# Patient Record
Sex: Female | Born: 1960 | Smoking: Never smoker
Health system: Southern US, Community
[De-identification: ages and names within clinical notes are randomized; demographics above are authoritative.]

## PROBLEM LIST (undated history)

## (undated) DIAGNOSIS — G4733 Obstructive sleep apnea (adult) (pediatric): Secondary | ICD-10-CM

## (undated) DIAGNOSIS — I10 Essential (primary) hypertension: Secondary | ICD-10-CM

## (undated) DIAGNOSIS — E039 Hypothyroidism, unspecified: Secondary | ICD-10-CM

## (undated) DIAGNOSIS — D049 Carcinoma in situ of skin, unspecified: Secondary | ICD-10-CM

## (undated) DIAGNOSIS — N200 Calculus of kidney: Secondary | ICD-10-CM

## (undated) HISTORY — DX: Calculus of kidney: N20.0

## (undated) HISTORY — PX: CHOLECYSTECTOMY: SHX55

## (undated) HISTORY — DX: Essential (primary) hypertension: I10

## (undated) HISTORY — PX: SCLEROTHERAPY: SHX6841

## (undated) HISTORY — DX: Hypothyroidism, unspecified: E03.9

## (undated) HISTORY — PX: ELBOW FRACTURE SURGERY: SHX616

## (undated) HISTORY — DX: Carcinoma in situ of skin, unspecified: D04.9

## (undated) HISTORY — DX: Obstructive sleep apnea (adult) (pediatric): G47.33

---

## 1993-04-08 HISTORY — PX: LITHOTRIPSY: SUR834

## 2018-11-27 ENCOUNTER — Ambulatory Visit (INDEPENDENT_AMBULATORY_CARE_PROVIDER_SITE_OTHER): Payer: Self-pay | Admitting: Family Medicine

## 2018-11-27 ENCOUNTER — Encounter: Payer: Self-pay | Admitting: Family Medicine

## 2018-11-27 ENCOUNTER — Other Ambulatory Visit: Payer: Self-pay

## 2018-11-27 DIAGNOSIS — M25562 Pain in left knee: Secondary | ICD-10-CM

## 2018-11-27 DIAGNOSIS — M25561 Pain in right knee: Secondary | ICD-10-CM

## 2018-11-27 DIAGNOSIS — Z13220 Encounter for screening for lipoid disorders: Secondary | ICD-10-CM

## 2018-11-27 DIAGNOSIS — Z85828 Personal history of other malignant neoplasm of skin: Secondary | ICD-10-CM

## 2018-11-27 DIAGNOSIS — T07XXXA Unspecified multiple injuries, initial encounter: Secondary | ICD-10-CM

## 2018-11-27 DIAGNOSIS — G8929 Other chronic pain: Secondary | ICD-10-CM

## 2018-11-27 DIAGNOSIS — D649 Anemia, unspecified: Secondary | ICD-10-CM

## 2018-11-27 DIAGNOSIS — I10 Essential (primary) hypertension: Secondary | ICD-10-CM

## 2018-11-27 DIAGNOSIS — G4733 Obstructive sleep apnea (adult) (pediatric): Secondary | ICD-10-CM | POA: Insufficient documentation

## 2018-11-27 DIAGNOSIS — E039 Hypothyroidism, unspecified: Secondary | ICD-10-CM

## 2018-11-27 DIAGNOSIS — G4730 Sleep apnea, unspecified: Secondary | ICD-10-CM

## 2018-11-27 MED ORDER — CALCIUM 600 + D 600-200 MG-UNIT PO TABS: 1.0000 | ORAL_TABLET | Freq: Every day | ORAL | 0 refills | Status: AC

## 2018-11-27 MED ORDER — FERROUS SULFATE IRON 200 (65 FE) MG PO TABS: 1.0000 | ORAL_TABLET | Freq: Every day | ORAL | Status: AC

## 2018-11-27 MED ORDER — LOSARTAN POTASSIUM 100 MG PO TABS: 100.0000 mg | ORAL_TABLET | Freq: Every day | ORAL | 1 refills | Status: DC

## 2018-11-27 MED ORDER — LEVOTHYROXINE SODIUM 175 MCG PO TABS: 175.0000 ug | ORAL_TABLET | Freq: Every day | ORAL | 1 refills | Status: DC

## 2018-11-27 MED ORDER — HYDROCHLOROTHIAZIDE 50 MG PO TABS: 50.0000 mg | ORAL_TABLET | Freq: Every day | ORAL | 1 refills | Status: DC

## 2018-11-27 MED ORDER — NAPROXEN SODIUM 220 MG PO TABS: 220.0000 mg | ORAL_TABLET | Freq: Two times a day (BID) | ORAL | Status: AC | PRN

## 2018-11-27 MED ORDER — ONE-A-DAY MENOPAUSE FORMULA PO TABS: 1.0000 | ORAL_TABLET | Freq: Every day | ORAL | Status: AC

## 2018-11-27 NOTE — Progress Notes (Signed)
Virtual Visit via Video Note  I connected with [name]   on 11/27/18 at  1:30 PM EDT by a video enabled telemedicine application and verified that I am speaking with the correct person using two identifiers.  Location patient: home Location provider:work office Persons participating in the virtual visit: patient, provider  I discussed the limitations of evaluation and management by telemedicine and the availability of in person appointments. The patient expressed understanding and agreed to proceed.   [name] DOB: [birth year] Encounter date: 11/27/2018  This is a 58 y.o. female who presents to establish care. Chief Complaint  Patient presents with  . Establish Care    History of present illness: No specific concerns today that she wants to discuss.   Needs some labwork done for her prescriptions.   HTN: doesn't check at home. Taking her medications as prescribed.   In wound care: was in [place] - was going there for doc appointments. Completed care in June. Circulation issues in lower extremities. Wears compression stockings and does pump to help with blood flow. Has not seen specialist for this outside of wound care except for cardiology. Had multiple wounds on left but has also had these on right as well.   Did have sclerotherapy for veins in legs which did help with wound healing.   Had enlarged spleen on imaging in past and had oncology work up for this but it was negative/normal.   Last mammogram/pap in November last year. States all was normal.   Sleep apnea: uses machine nightly.   Skin cancer (basal cell) superficial on back - hasn't seen derm regularly.   Last colonoscopy about 5 years ago was normal.    Past Medical History:  Diagnosis Date  . 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 Hypertension   . Hypothyroid   . Nephrolithiasis   . OSA (obstructive sleep apnea)    Past Surgical History:  Procedure Laterality Date  . CHOLECYSTECTOMY     . ELBOW FRACTURE SURGERY    . LITHOTRIPSY Left 1995  . SCLEROTHERAPY     Allergies  Allergen Reactions  . Ivp Dye [Iodinated Diagnostic Agents]     Hives, hot  . Sulfa Antibiotics     Hives, hot flashes   No outpatient medications have been marked as taking for the 11/27/18 encounter (Office Visit) with Caren Macadam, MD.   Social History   Tobacco Use  . Smoking status: Never Smoker  . Smokeless tobacco: Never Used  Substance Use Topics  . Alcohol use: Never    Frequency: Never   Family History  Problem Relation Age of Onset  . Hypertension Mother   . Lymphoma Mother   . Squamous cell carcinoma Father   . COPD Father   . Asthma Father   . Pancreatic cancer Father   . Diabetes Mellitus I Sister   . Heart disease Maternal Grandmother   . Heart disease Maternal Grandfather   . Thyroid disease Paternal Grandmother   . Alzheimer's disease Paternal Grandmother   . Heart disease Paternal Grandfather      Review of Systems  Constitutional: Negative for chills, fatigue and fever.  Respiratory: Negative for cough, chest tightness, shortness of breath and wheezing.   Cardiovascular: Negative for chest pain, palpitations and leg swelling.       Had stress testing in [place] due to chest pain which was normal (per patient) and has resolved.    Objective:  There were no vitals taken  for this visit.      BP Readings from Last 3 Encounters:  No data found for BP   Wt Readings from Last 3 Encounters:  No data found for Wt    EXAM:  GENERAL: alert, oriented, appears well and in no acute distress  HEENT: atraumatic, conjunctiva clear, no obvious abnormalities on inspection of external nose and ears  NECK: normal movements of the head and neck  LUNGS: on inspection no signs of respiratory distress, breathing rate appears normal, no obvious gross SOB, gasping or wheezing  CV: no obvious cyanosis  MS: moves all visible extremities without noticeable  abnormality  PSYCH/NEURO: pleasant and cooperative, no obvious depression or anxiety, speech and thought processing grossly intact  SKIN: no facial or neck abnormalities appreciated.  Assessment/Plan  1. Hypertension, unspecified type Medications refilled today.  Lab work ordered for patient to complete and we will call her back to set this up.  I did encourage her to start checking pressures at home so we have a baseline. - hydrochlorothiazide (HYDRODIURIL) 50 MG tablet; Take 1 tablet (50 mg total) by mouth daily.  Dispense: 90 tablet; Refill: 1 - losartan (COZAAR) 100 MG tablet; Take 1 tablet (100 mg total) by mouth daily.  Dispense: 90 tablet; Refill: 1 - CBC with Differential/Platelet; Future - Comprehensive metabolic panel; Future  2. Hypothyroidism, unspecified type We will recheck TSH.  I did refill previous dose of Synthroid for her. - levothyroxine (SYNTHROID) 175 MCG tablet; Take 1 tablet (175 mcg total) by mouth daily before breakfast.  Dispense: 90 tablet; Refill: 1 - TSH; Future  3. Sleep apnea, unspecified type She has done well with CPAP.  I am putting in referral for long-term management for her. - Ambulatory referral to Sleep Studies  4. Chronic pain of both knees Can further evaluate when in office at next visit.  Would consider Ortho for follow-up. - naproxen sodium (ALEVE) 220 MG tablet; Take 1 tablet (220 mg total) by mouth 2 (two) times daily as needed.  5. Lipid screening - Lipid panel; Future  6. Multiple fractures Consider bone density once COVID situation is improved. - Calcium Carb-Cholecalciferol (CALCIUM 600 + D) 600-200 MG-UNIT TABS; Take 1 tablet by mouth daily.  Dispense: 30 tablet; Refill: 0 - VITAMIN D 25 Hydroxy (Vit-D Deficiency, Fractures); Future  7. Anemia, unspecified type - Ferrous Sulfate Dried (FERROUS SULFATE IRON) 200 (65 Fe) MG TABS; Take 1 tablet by mouth daily.  Dispense: 30 tablet - Vitamin B12; Future - IBC + Ferritin;  Future  8. History of skin cancer - Ambulatory referral to Dermatology  Return for bloodwork. Schedule physical after this.   I discussed the assessment and treatment plan with the patient. The patient was provided an opportunity to ask questions and all were answered. The patient agreed with the plan and demonstrated an understanding of the instructions.   The patient was advised to call back or seek an in-person evaluation if the symptoms worsen or if the condition fails to improve as anticipated.  I provided 35 minutes of non-face-to-face time during this encounter.   Theodis ShoveJunell Koberlein, MD

## 2018-11-30 ENCOUNTER — Telehealth: Payer: Self-pay | Admitting: *Deleted

## 2018-11-30 NOTE — Telephone Encounter (Signed)
Left a detailed message at the pts cell number to call for appts as below.   

## 2018-11-30 NOTE — Telephone Encounter (Signed)
-----   Message from Caren Macadam, MD sent at 11/27/2018  2:36 PM EDT ----- Please schedule lab visit for her; physical can be schedule in office as well if she would like- ok to push out a few months.

## 2018-12-07 ENCOUNTER — Other Ambulatory Visit: Payer: Self-pay

## 2018-12-07 ENCOUNTER — Ambulatory Visit: Payer: BC Managed Care – PPO

## 2018-12-07 DIAGNOSIS — D649 Anemia, unspecified: Secondary | ICD-10-CM

## 2018-12-07 DIAGNOSIS — Z13220 Encounter for screening for lipoid disorders: Secondary | ICD-10-CM

## 2018-12-07 DIAGNOSIS — E039 Hypothyroidism, unspecified: Secondary | ICD-10-CM

## 2018-12-07 DIAGNOSIS — T07XXXA Unspecified multiple injuries, initial encounter: Secondary | ICD-10-CM

## 2018-12-07 DIAGNOSIS — I10 Essential (primary) hypertension: Secondary | ICD-10-CM

## 2018-12-07 LAB — COMPREHENSIVE METABOLIC PANEL
ALT: 17 U/L (ref 0–35)
AST: 21 U/L (ref 0–37)
Albumin: 4.2 g/dL (ref 3.5–5.2)
Alkaline Phosphatase: 90 U/L (ref 39–117)
BUN: 20 mg/dL (ref 6–23)
CO2: 29 mEq/L (ref 19–32)
Calcium: 9.4 mg/dL (ref 8.4–10.5)
Chloride: 103 mEq/L (ref 96–112)
Creatinine, Ser: 0.93 mg/dL (ref 0.40–1.20)
GFR: 61.51 mL/min (ref >60.00)
Glucose, Bld: 92 mg/dL (ref 70–99)
Potassium: 4.5 mEq/L (ref 3.5–5.1)
Sodium: 141 mEq/L (ref 135–145)
Total Bilirubin: 0.3 mg/dL (ref 0.2–1.2)
Total Protein: 7.3 g/dL (ref 6.0–8.3)

## 2018-12-07 LAB — CBC WITH DIFFERENTIAL/PLATELET
Basophils Absolute: 0 10*3/uL (ref 0.0–0.1)
Basophils Relative: 0.3 % (ref 0.0–3.0)
Eosinophils Absolute: 0.2 10*3/uL (ref 0.0–0.7)
Eosinophils Relative: 3.6 % (ref 0.0–5.0)
HCT: 33.8 % — ABNORMAL LOW (ref 36.0–46.0)
Hemoglobin: 11.4 g/dL — ABNORMAL LOW (ref 12.0–15.0)
Lymphocytes Relative: 24.4 % (ref 12.0–46.0)
Lymphs Abs: 1.5 10*3/uL (ref 0.7–4.0)
MCHC: 33.7 g/dL (ref 30.0–36.0)
MCV: 97.2 fl (ref 78.0–100.0)
Monocytes Absolute: 0.4 10*3/uL (ref 0.1–1.0)
Monocytes Relative: 6.3 % (ref 3.0–12.0)
Neutro Abs: 3.9 10*3/uL (ref 1.4–7.7)
Neutrophils Relative %: 65.4 % (ref 43.0–77.0)
Platelets: 185 10*3/uL (ref 150.0–400.0)
RBC: 3.48 Mil/uL — ABNORMAL LOW (ref 3.87–5.11)
RDW: 13.4 % (ref 11.5–15.5)
WBC: 6 10*3/uL (ref 4.0–10.5)

## 2018-12-07 LAB — LIPID PANEL
Cholesterol: 166 mg/dL (ref 0–200)
HDL: 45.7 mg/dL (ref >39.00)
LDL Cholesterol: 100 mg/dL — ABNORMAL HIGH (ref 0–99)
NonHDL: 120.02
Total CHOL/HDL Ratio: 4
Triglycerides: 98 mg/dL (ref 0.0–149.0)
VLDL: 19.6 mg/dL (ref 0.0–40.0)

## 2018-12-07 LAB — VITAMIN B12: Vitamin B-12: 249 pg/mL (ref 211–911)

## 2018-12-07 LAB — IBC + FERRITIN
Ferritin: 189 ng/mL (ref 10.0–291.0)
Iron: 64 ug/dL (ref 42–145)
Saturation Ratios: 18.1 % — ABNORMAL LOW (ref 20.0–50.0)
Transferrin: 252 mg/dL (ref 212.0–360.0)

## 2018-12-07 LAB — TSH: TSH: 3.69 u[IU]/mL (ref 0.35–4.50)

## 2018-12-07 LAB — VITAMIN D 25 HYDROXY (VIT D DEFICIENCY, FRACTURES): VITD: 33.46 ng/mL (ref 30.00–100.00)

## 2019-01-12 ENCOUNTER — Encounter: Payer: Self-pay | Admitting: Pulmonary Disease

## 2019-01-12 ENCOUNTER — Ambulatory Visit: Payer: BC Managed Care – PPO | Admitting: Pulmonary Disease

## 2019-01-12 ENCOUNTER — Other Ambulatory Visit: Payer: Self-pay

## 2019-01-12 DIAGNOSIS — Z9989 Dependence on other enabling machines and devices: Secondary | ICD-10-CM | POA: Diagnosis not present

## 2019-01-12 DIAGNOSIS — G4733 Obstructive sleep apnea (adult) (pediatric): Secondary | ICD-10-CM

## 2019-01-12 DIAGNOSIS — R0600 Dyspnea, unspecified: Secondary | ICD-10-CM

## 2019-01-12 DIAGNOSIS — R0609 Other forms of dyspnea: Secondary | ICD-10-CM

## 2019-01-12 NOTE — Progress Notes (Signed)
Subjective:    Patient ID: [name], female    DOB: 1960/06/25, 58 y.o.   MRN: [id number]  HPI  Chief Complaint  Patient presents with  . Consult    Obstructive Sleep Apnea on CPAP    58 year old presents to establish for obstructive sleep apnea. She was diagnosed about 5 years ago with a sleep study done in [place] which showed AHI of 80/hour.  She was started on full face mask with auto CPAP and has done very well since then with improvement in her daytime somnolence and fatigue. She brings in her CPAP chip from which we were able to obtain a download, she chose auto settings of 7 to 12 cm with excellent compliance 6.5 to 7 hours per night, no residual events and average pressure of 10 cm with minimal leak. She has adjusted to the full facemask and denies any problems with it. She has moved from [place] to [place] and is working as a Optometrist and taking care of her grandchildren.  Epworth sleepiness score is 7 and she denies excessive daytime somnolence and fatigue. Bedtime is between 10 and 11 PM, she sleeps on her back but also on her side, with 2 pillows, sleep latency is 10 to 15 minutes, denies nocturnal awakenings has occasional nocturia and is out of bed by 5:30 AM feeling refreshed with occasional dryness of mouth but denies headaches. She has gained 20 pounds in the last 6 months There is no history suggestive of cataplexy, sleep paralysis or parasomnias  She does report dyspnea on exertion and wonders if this is related to deconditioning   Past Medical History:  Diagnosis Date  . 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 Hypertension   . Hypothyroid   . Nephrolithiasis   . OSA (obstructive sleep apnea)     Past Surgical History:  Procedure Laterality Date  . CHOLECYSTECTOMY    . ELBOW FRACTURE SURGERY    . LITHOTRIPSY Left 1995  . SCLEROTHERAPY      Allergies  Allergen Reactions  . Ivp Dye [Iodinated Diagnostic Agents]    Hives, hot  . Sulfa Antibiotics     Hives, hot flashes    Social History   Socioeconomic History  . Marital status: Unknown    Spouse name: Not on file  . Number of children: Not on file  . Years of education: Not on file  . Highest education level: Not on file  Occupational History  . Not on file  Social Needs  . Financial resource strain: Not on file  . Food insecurity    Worry: Not on file    Inability: Not on file  . Transportation needs    Medical: Not on file    Non-medical: Not on file  Tobacco Use  . Smoking status: Never Smoker  . Smokeless tobacco: Never Used  Substance and Sexual Activity  . Alcohol use: Never    Frequency: Never  . Drug use: Never  . Sexual activity: Not Currently  Lifestyle  . Physical activity    Days per week: Not on file    Minutes per session: Not on file  . Stress: Not on file  Relationships  . Social Herbalist on phone: Not on file    Gets together: Not on file    Attends religious service: Not on file    Active member of club or organization: Not on file    Attends meetings of clubs or  organizations: Not on file    Relationship status: Not on file  . Intimate partner violence    Fear of current or ex partner: Not on file    Emotionally abused: Not on file    Physically abused: Not on file    Forced sexual activity: Not on file  Other Topics Concern  . Not on file  Social History Narrative  . Not on file     Family History  Problem Relation Age of Onset  . Hypertension Mother   . Lymphoma Mother   . Squamous cell carcinoma Father   . COPD Father   . Asthma Father   . Pancreatic cancer Father   . Diabetes Mellitus I Sister   . Heart disease Maternal Grandmother   . Heart disease Maternal Grandfather   . Thyroid disease Paternal Grandmother   . Alzheimer's disease Paternal Grandmother   . Heart disease Paternal Grandfather      Review of Systems  Constitutional: negative for anorexia, fevers and  sweats  Eyes: negative for irritation, redness and visual disturbance  Ears, nose, mouth, throat, and face: negative for earaches, epistaxis, nasal congestion and sore throat  Respiratory: negative for cough, sputum and wheezing positive for dyspnea on activity Cardiovascular: negative for chest pain,  orthopnea, palpitations and syncope positive for lower extremity edema and open wounds Gastrointestinal: negative for abdominal pain, constipation, diarrhea, melena, nausea and vomiting  Genitourinary:negative for dysuria, frequency and hematuria  Hematologic/lymphatic: negative for bleeding, easy bruising and lymphadenopathy  Musculoskeletal:negative for arthralgias, muscle weakness positive for stiff joints  Neurological: negative for coordination problems, gait problems, headaches and weakness  Endocrine: negative for diabetic symptoms including polydipsia, polyuria and weight loss     Objective:   Physical Exam        Assessment & Plan:

## 2019-01-12 NOTE — Assessment & Plan Note (Signed)
Likely related to weight gain and deconditioning. Recommend to start on walking for 30 minutes at her own pace 4 times a week

## 2019-01-12 NOTE — Patient Instructions (Signed)
Give [place] a call when he needs supplies and we will set you up with a local DME

## 2019-01-12 NOTE — Assessment & Plan Note (Signed)
Events appear to be well-controlled on auto CPAP, average pressure is 10 cm.  She is very compliant and CPAP is certainly helped improve her daytime somnolence and fatigue.  We will try to obtain her sleep studies from [facility] pulmonary.  She will call us when she needs supplies and we will set her up with a local DME  Weight loss encouraged, compliance with goal of at least 4-6 hrs every night is the expectation. Advised against medications with sedative side effects Cautioned against driving when sleepy - understanding that sleepiness will vary on a day to day basis

## 2019-02-03 ENCOUNTER — Encounter: Payer: Self-pay | Admitting: Family Medicine

## 2019-02-03 ENCOUNTER — Other Ambulatory Visit: Payer: Self-pay

## 2019-02-03 ENCOUNTER — Ambulatory Visit (INDEPENDENT_AMBULATORY_CARE_PROVIDER_SITE_OTHER): Payer: BC Managed Care – PPO | Admitting: Family Medicine

## 2019-02-03 VITALS — BP 132/82 | HR 76 | Temp 97.5°F | Ht 61.0 in | Wt 305.3 lb

## 2019-02-03 DIAGNOSIS — Z0000 Encounter for general adult medical examination without abnormal findings: Secondary | ICD-10-CM

## 2019-02-03 DIAGNOSIS — Z1231 Encounter for screening mammogram for malignant neoplasm of breast: Secondary | ICD-10-CM | POA: Diagnosis not present

## 2019-02-03 DIAGNOSIS — I10 Essential (primary) hypertension: Secondary | ICD-10-CM

## 2019-02-03 DIAGNOSIS — G4733 Obstructive sleep apnea (adult) (pediatric): Secondary | ICD-10-CM | POA: Diagnosis not present

## 2019-02-03 DIAGNOSIS — Z9989 Dependence on other enabling machines and devices: Secondary | ICD-10-CM

## 2019-02-03 DIAGNOSIS — Z23 Encounter for immunization: Secondary | ICD-10-CM | POA: Diagnosis not present

## 2019-02-03 DIAGNOSIS — E039 Hypothyroidism, unspecified: Secondary | ICD-10-CM

## 2019-02-03 NOTE — Patient Instructions (Signed)
Consider adding B12 supplement 1054mcg sublingual tablet daily

## 2019-02-03 NOTE — Progress Notes (Signed)
[name] DOB: 10/29/1960 Encounter date: 02/03/2019  This is a 58 y.o. female who presents for complete physical   History of present illness/Additional concerns: HTN: hctz, losartan  Hypothyroid: synthroid 175mcg  Sleep apnea: referred to specialist, wears machine nightly. Saw specialist already.   bilat chronic knee pain - just depends on day and activity level. Really wants to lose weight. Has been harder with her being home. Really upset her today.   Anemia: stable on bloodwork from 12/07/18.  Hx of skin ca: referred to dermatology; has appointment coming up in November/December.   Doesn't do well with diets. Limited exercise due to knee pain, easily winded. She also had issues wounds in legs. Most luck that she had was on weight watchers. Tried to go back to this a few years ago, but she states it was online and she didn't like this. Trying to stick with it is an issue with her. Doesn't feel like she over-eats, most type of foods - meat, potatoes, bread. More sedentary with COVID.   Stomach feels enlarged. Worries that this is something more than weight gain.    mammogram: had this done last November. Gets yearly. -pap was done last year; normal per patient.  -colonoscopy done in last 5 years. Patient states this was normal and repeat was due in 10 years.   Has broken both arms falling in last 5 years.   Past Medical History:  Diagnosis Date  . 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 Hypertension   . Hypothyroid   . Nephrolithiasis   . OSA (obstructive sleep apnea)    Past Surgical History:  Procedure Laterality Date  . CHOLECYSTECTOMY    . ELBOW FRACTURE SURGERY    . LITHOTRIPSY Left 1995  . SCLEROTHERAPY     Allergies  Allergen Reactions  . Ivp Dye [Iodinated Diagnostic Agents]     Hives, hot  . Sulfa Antibiotics     Hives, hot flashes   Current Meds  Medication Sig  . acetaminophen (TYLENOL) 500 MG tablet Take 500 mg by mouth as needed.  .  Calcium Carb-Cholecalciferol (CALCIUM 600 + D) 600-200 MG-UNIT TABS Take 1 tablet by mouth daily.  . Ferrous Sulfate Dried (FERROUS SULFATE IRON) 200 (65 Fe) MG TABS Take 1 tablet by mouth daily.  . hydrochlorothiazide (HYDRODIURIL) 50 MG tablet Take 1 tablet (50 mg total) by mouth daily.  Marland Kitchen. levothyroxine (SYNTHROID) 175 MCG tablet Take 1 tablet (175 mcg total) by mouth daily before breakfast.  . losartan (COZAAR) 100 MG tablet Take 1 tablet (100 mg total) by mouth daily.  . Multiple Vitamins-Minerals (ONE-A-DAY MENOPAUSE FORMULA) TABS Take 1 tablet by mouth daily.  . naproxen sodium (ALEVE) 220 MG tablet Take 1 tablet (220 mg total) by mouth 2 (two) times daily as needed.  Marland Kitchen. OVER THE COUNTER MEDICATION OTC antacid as needed (cannot recall name)   Social History   Tobacco Use  . Smoking status: Never Smoker  . Smokeless tobacco: Never Used  Substance Use Topics  . Alcohol use: Never    Frequency: Never   Family History  Problem Relation Age of Onset  . Hypertension Mother   . Lymphoma Mother   . Squamous cell carcinoma Father   . COPD Father   . Asthma Father   . Pancreatic cancer Father   . Diabetes Mellitus I Sister   . Heart disease Maternal Grandmother   . Heart disease Maternal Grandfather   . Thyroid disease Paternal Grandmother   .  Alzheimer's disease Paternal Grandmother   . Heart disease Paternal Grandfather      Review of Systems  Constitutional: Negative for activity change, appetite change, chills, fatigue, fever and unexpected weight change.  HENT: Negative for congestion, ear pain, hearing loss, sinus pressure, sinus pain, sore throat and trouble swallowing.   Eyes: Negative for pain and visual disturbance.  Respiratory: Negative for cough, chest tightness, shortness of breath and wheezing.   Cardiovascular: Negative for chest pain, palpitations and leg swelling.  Gastrointestinal: Negative for abdominal pain, blood in stool, constipation, diarrhea, nausea and  vomiting.  Genitourinary: Negative for difficulty urinating and menstrual problem.  Musculoskeletal: Negative for arthralgias and back pain.  Skin: Negative for rash.  Neurological: Negative for dizziness, weakness, numbness and headaches.  Hematological: Negative for adenopathy. Does not bruise/bleed easily.  Psychiatric/Behavioral: Negative for sleep disturbance and suicidal ideas. The patient is not nervous/anxious.     CBC:  Lab Results  Component Value Date   WBC 6.0 12/07/2018   HGB 11.4 (L) 12/07/2018   HCT 33.8 (L) 12/07/2018   MCHC 33.7 12/07/2018   RDW 13.4 12/07/2018   PLT 185.0 12/07/2018   CMP: Lab Results  Component Value Date   NA 141 12/07/2018   K 4.5 12/07/2018   CL 103 12/07/2018   CO2 29 12/07/2018   GLUCOSE 92 12/07/2018   BUN 20 12/07/2018   CREATININE 0.93 12/07/2018   CALCIUM 9.4 12/07/2018   PROT 7.3 12/07/2018   BILITOT 0.3 12/07/2018   ALKPHOS 90 12/07/2018   ALT 17 12/07/2018   AST 21 12/07/2018   LIPID: Lab Results  Component Value Date   CHOL 166 12/07/2018   TRIG 98.0 12/07/2018   HDL 45.70 12/07/2018   LDLCALC 100 (H) 12/07/2018    Objective:  BP 132/82 (BP Location: Left Arm, Patient Position: Sitting, Cuff Size: Large)   Pulse 76   Temp (!) 97.5 F (36.4 C) (Temporal)   Ht 5\' 1"  (1.549 m)   Wt (!) 305 lb 4.8 oz (138.5 kg)   SpO2 96%   BMI 57.69 kg/m   Weight: (!) 305 lb 4.8 oz (138.5 kg)   BP Readings from Last 3 Encounters:  02/03/19 132/82  01/12/19 130/72   Wt Readings from Last 3 Encounters:  02/03/19 (!) 305 lb 4.8 oz (138.5 kg)  01/12/19 (!) 304 lb (137.9 kg)    Physical Exam Constitutional:      General: She is not in acute distress.    Appearance: She is well-developed.  HENT:     Head: Normocephalic and atraumatic.     Right Ear: External ear normal.     Left Ear: External ear normal.     Mouth/Throat:     Pharynx: No oropharyngeal exudate.  Eyes:     Conjunctiva/sclera: Conjunctivae normal.      Pupils: Pupils are equal, round, and reactive to light.  Neck:     Musculoskeletal: Normal range of motion and neck supple.     Thyroid: No thyromegaly.  Cardiovascular:     Rate and Rhythm: Normal rate and regular rhythm.     Heart sounds: Normal heart sounds. No murmur. No friction rub. No gallop.   Pulmonary:     Effort: Pulmonary effort is normal.     Breath sounds: Normal breath sounds.  Chest:     Comments: Breast exam deferred, but mammogram was ordered Abdominal:     General: Bowel sounds are normal. There is no distension.     Palpations: Abdomen  is soft. There is no mass.     Tenderness: There is no abdominal tenderness. There is no guarding.     Hernia: No hernia is present.  Musculoskeletal: Normal range of motion.        General: No tenderness or deformity.  Lymphadenopathy:     Cervical: No cervical adenopathy.  Skin:    General: Skin is warm and dry.     Findings: No rash.     Comments: Skin exam is deferred as she is following with dermatology  Neurological:     Mental Status: She is alert and oriented to person, place, and time.     Deep Tendon Reflexes: Reflexes normal.     Reflex Scores:      Tricep reflexes are 2+ on the right side and 2+ on the left side.      Bicep reflexes are 2+ on the right side and 2+ on the left side.      Brachioradialis reflexes are 2+ on the right side and 2+ on the left side.      Patellar reflexes are 2+ on the right side and 2+ on the left side. Psychiatric:        Speech: Speech normal.        Behavior: Behavior normal.        Thought Content: Thought content normal.     Assessment/Plan: Health Maintenance Due  Topic Date Due  . TETANUS/TDAP  01/06/1978  . COLONOSCOPY  01/06/2009   Health Maintenance reviewed. We will get previous records from [name] with prior colonoscopy, pap.  1. Preventative health care We discussed importance of exercise and weight loss.  She was upset about her weight today.  She has not been  over 300 pounds before.  We discussed working on lower carbohydrate diet.  Her husband is diabetic and should be eating a lower carb diet anyway.  We discussed importance of daily exercise, but this is limited secondary to her knee pain.  I went over a restricted calorie diet with her and we discussed finding her motivation to work on healthier eating.  We discussed feeding holidays not of the time for temptation, but is a time to get a gift to herself for better health.  2. OSA on CPAP Uses CPAP daily.  Has followed up with pulmonology.  3. Hypertension, unspecified type Controlled.  Continue current medication.  4. Hypothyroidism, unspecified type Controlled.  Continue current medication.  5. Encounter for screening mammogram for malignant neoplasm of breast - MM DIGITAL SCREENING BILATERAL; Future  6. Need for immunization against influenza - Flu Vaccine QUAD 6+ mos PF IM (Fluarix Quad PF)  7. Need for shingles vaccine - Varicella-zoster vaccine IM (Shingrix)  Return for i will call her for follow up after record review.  We discussed having a short term follow-up so that we can touch base with weight loss and make sure she is making progress.  Theodis Shove, MD

## 2019-04-13 ENCOUNTER — Other Ambulatory Visit: Payer: Self-pay

## 2019-04-13 ENCOUNTER — Ambulatory Visit
Admission: RE | Admit: 2019-04-13 | Discharge: 2019-04-13 | Disposition: A | Discharge status: Home / Self Care | Payer: BC Managed Care – PPO | Source: Ambulatory Visit | Attending: Family Medicine | Admitting: Family Medicine

## 2019-04-13 DIAGNOSIS — Z1231 Encounter for screening mammogram for malignant neoplasm of breast: Secondary | ICD-10-CM

## 2019-04-13 IMAGING — MG DIGITAL SCREENING BILAT W/ CAD
7 series · 7 of 7 positions shown · non-contrast
Comparison: None.

CLINICAL DATA: Screening.

EXAM:
DIGITAL SCREENING BILATERAL MAMMOGRAM WITH CAD

[L MLO (1 of 2)]
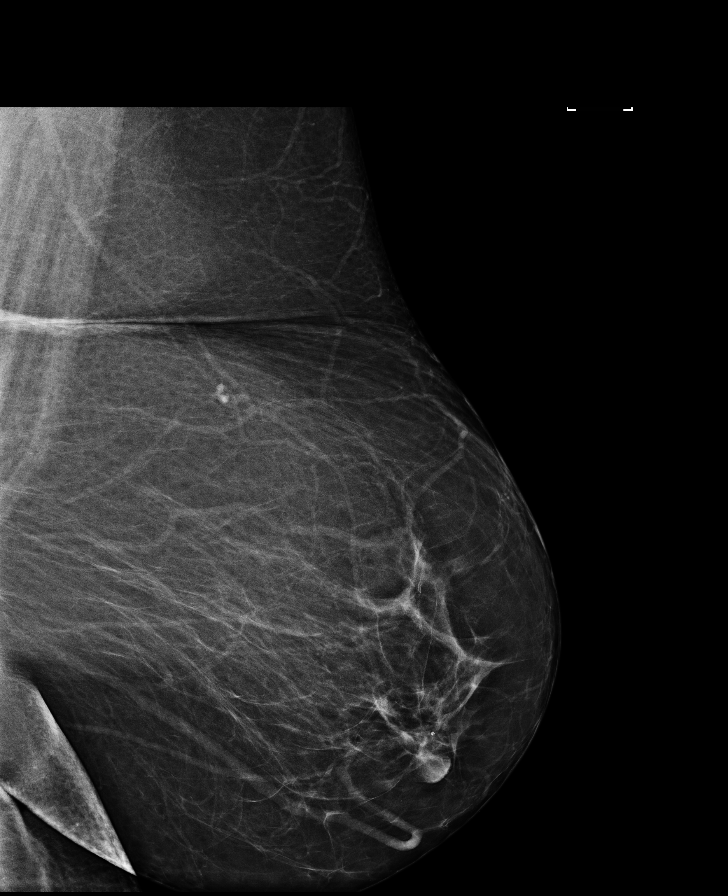

[R MLO (1 of 3)]
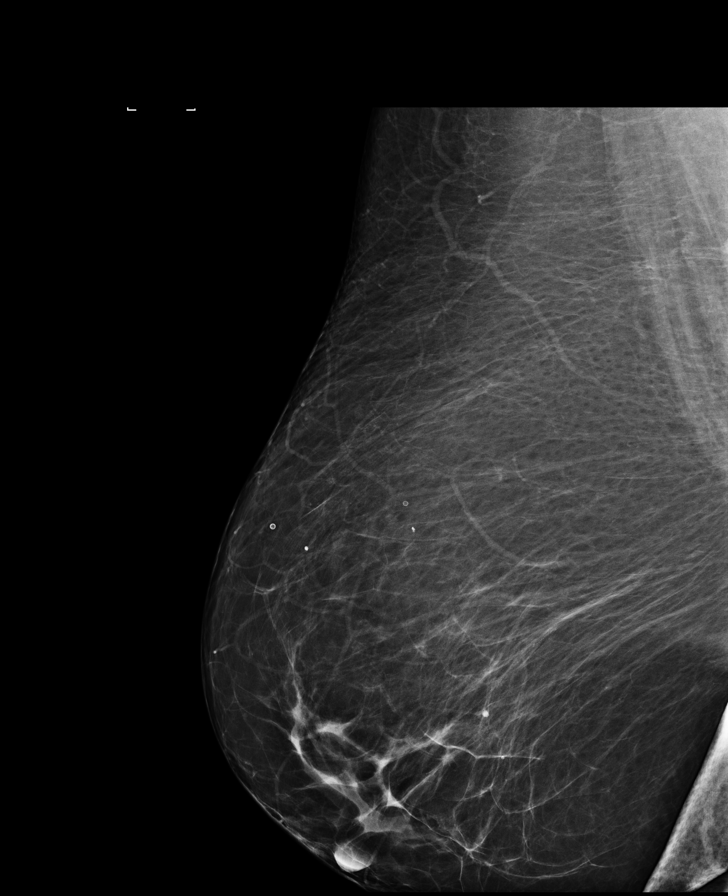

[R MLO (2 of 3)]
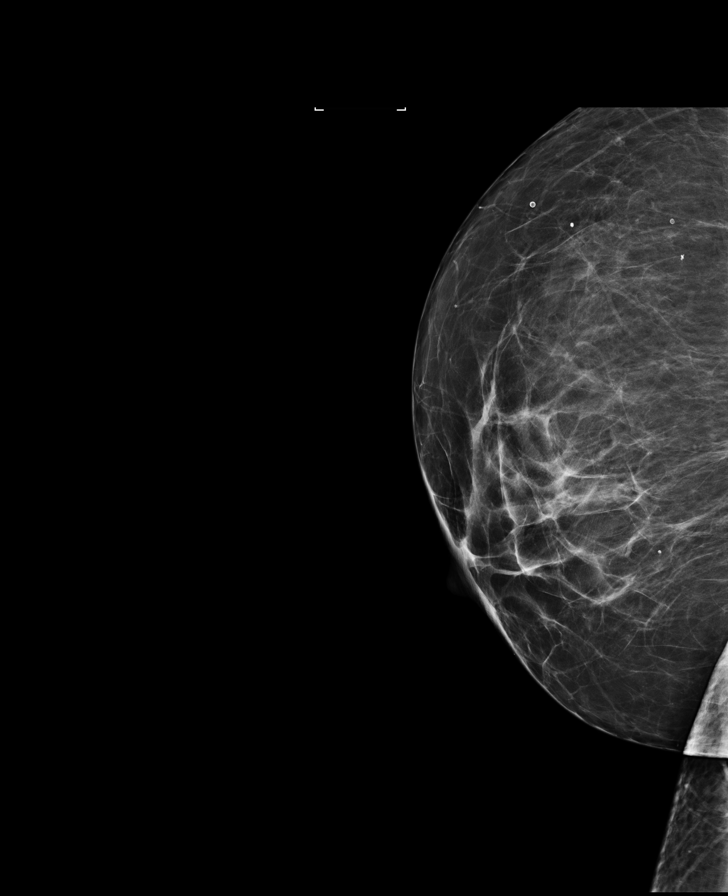

[R CC]
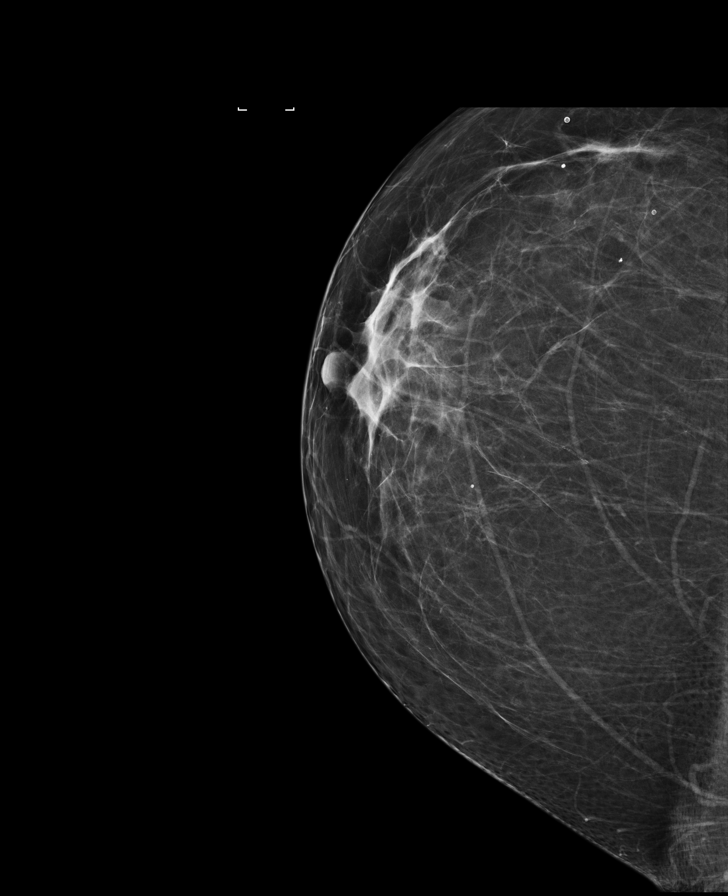

[L CC]
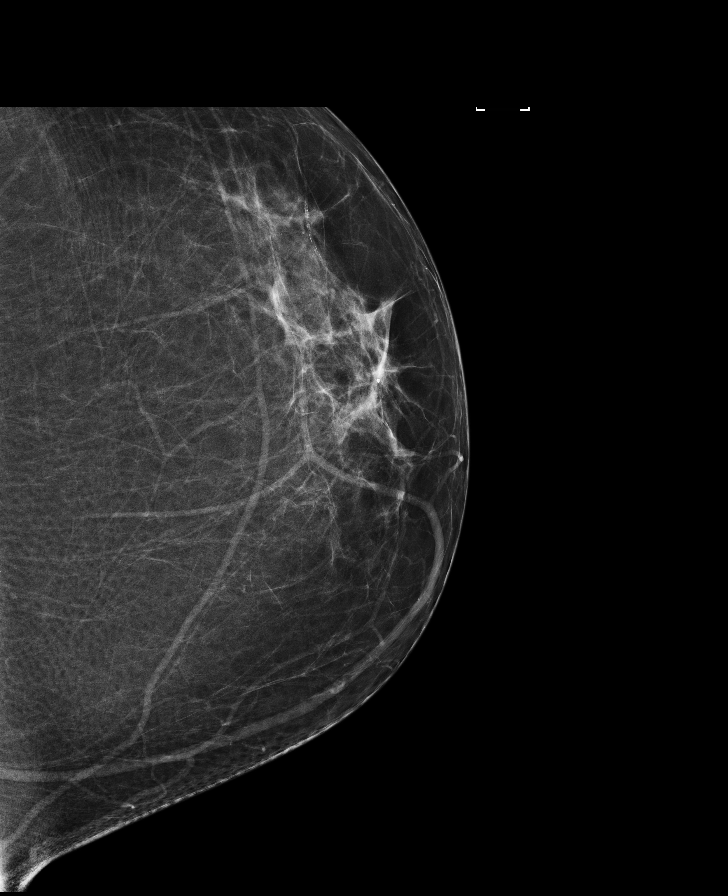

[R MLO (3 of 3)]
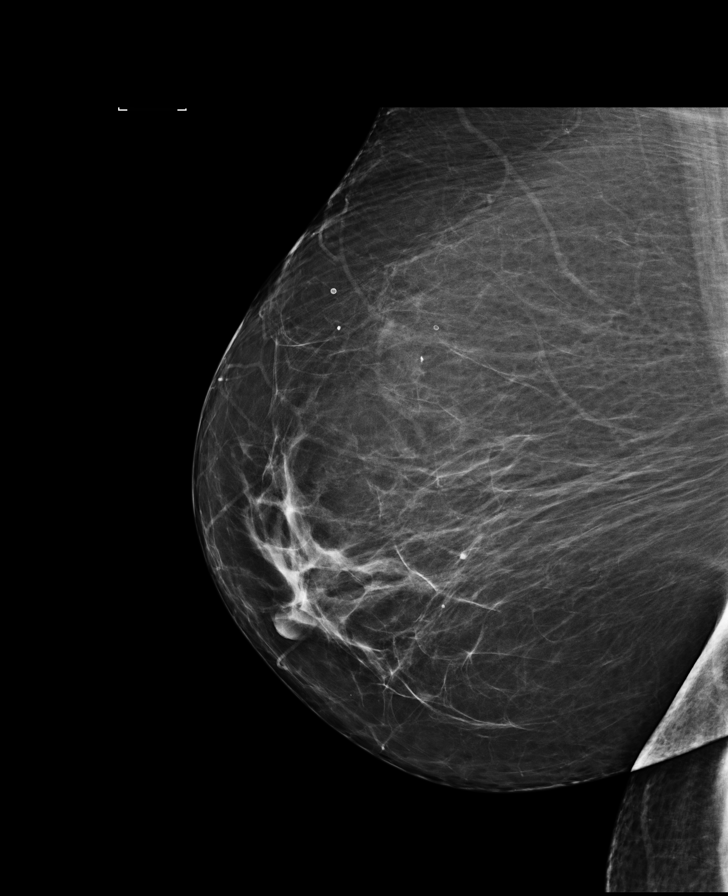

[L MLO (2 of 2)]
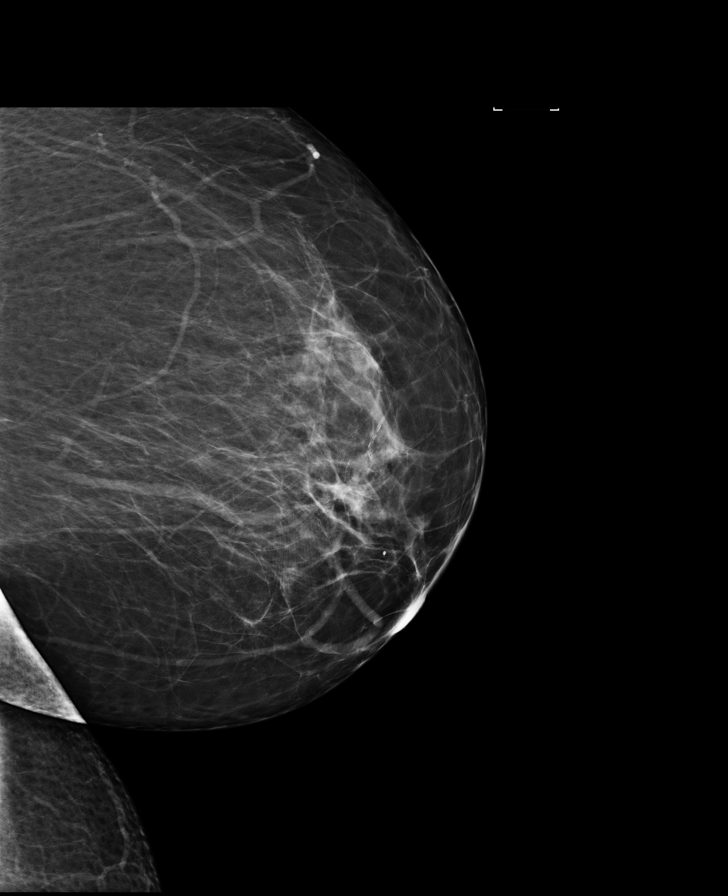

[7 of 7 positions shown; findings below may reference images not displayed]

ACR Breast Density Category b: There are scattered areas of
fibroglandular density.
FINDINGS: There are no findings suspicious for malignancy. Images were
processed with CAD.
IMPRESSION: No mammographic evidence of malignancy. A result letter of this
screening mammogram will be mailed directly to the patient.

RECOMMENDATION:
Screening mammogram in one year. (Code:[GD])

BI-RADS CATEGORY  1: Negative.

## 2019-06-03 ENCOUNTER — Other Ambulatory Visit: Payer: Self-pay | Admitting: Family Medicine

## 2019-06-03 DIAGNOSIS — I10 Essential (primary) hypertension: Secondary | ICD-10-CM

## 2019-07-12 ENCOUNTER — Telehealth: Payer: Self-pay | Admitting: Family Medicine

## 2019-07-12 ENCOUNTER — Other Ambulatory Visit: Payer: Self-pay | Admitting: Family Medicine

## 2019-07-12 DIAGNOSIS — E039 Hypothyroidism, unspecified: Secondary | ICD-10-CM

## 2019-07-12 MED ORDER — LEVOTHYROXINE SODIUM 175 MCG PO TABS: 175.0000 ug | ORAL_TABLET | Freq: Every day | ORAL | 1 refills | Status: DC

## 2019-07-12 NOTE — Telephone Encounter (Signed)
The pharmacy called to see if it is okay to change the patients medication to a generic manufacture for the patients Rx.  levothyroxine (SYNTHROID) 175 MCG tablet  [address] [place], [place] - [address] Phone:  [phone]  Fax:  [phone]

## 2019-07-12 NOTE — Telephone Encounter (Signed)
done

## 2019-08-18 ENCOUNTER — Other Ambulatory Visit: Payer: Self-pay | Admitting: Family Medicine

## 2019-08-18 DIAGNOSIS — I10 Essential (primary) hypertension: Secondary | ICD-10-CM

## 2019-08-29 ENCOUNTER — Other Ambulatory Visit: Payer: Self-pay | Admitting: Family Medicine

## 2019-08-29 DIAGNOSIS — I10 Essential (primary) hypertension: Secondary | ICD-10-CM

## 2019-08-30 NOTE — Telephone Encounter (Signed)
Pt is calling in stating that she is out of her losartan and will call back to get an appointment to see Dr. 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    Pharm:  [facility]

## 2019-09-22 ENCOUNTER — Other Ambulatory Visit: Payer: Self-pay

## 2019-09-22 ENCOUNTER — Ambulatory Visit (INDEPENDENT_AMBULATORY_CARE_PROVIDER_SITE_OTHER): Payer: BC Managed Care – PPO | Admitting: Family Medicine

## 2019-09-22 ENCOUNTER — Encounter: Payer: Self-pay | Admitting: Family Medicine

## 2019-09-22 VITALS — BP 120/80 | HR 72 | Temp 97.9°F | Ht 61.0 in | Wt 299.0 lb

## 2019-09-22 DIAGNOSIS — I10 Essential (primary) hypertension: Secondary | ICD-10-CM | POA: Diagnosis not present

## 2019-09-22 DIAGNOSIS — E039 Hypothyroidism, unspecified: Secondary | ICD-10-CM

## 2019-09-22 DIAGNOSIS — E538 Deficiency of other specified B group vitamins: Secondary | ICD-10-CM | POA: Diagnosis not present

## 2019-09-22 DIAGNOSIS — Z13220 Encounter for screening for lipoid disorders: Secondary | ICD-10-CM | POA: Diagnosis not present

## 2019-09-22 DIAGNOSIS — M25561 Pain in right knee: Secondary | ICD-10-CM

## 2019-09-22 DIAGNOSIS — E559 Vitamin D deficiency, unspecified: Secondary | ICD-10-CM | POA: Diagnosis not present

## 2019-09-22 DIAGNOSIS — G8929 Other chronic pain: Secondary | ICD-10-CM

## 2019-09-22 DIAGNOSIS — G4733 Obstructive sleep apnea (adult) (pediatric): Secondary | ICD-10-CM

## 2019-09-22 DIAGNOSIS — Z9989 Dependence on other enabling machines and devices: Secondary | ICD-10-CM

## 2019-09-22 NOTE — Patient Instructions (Addendum)
Health Maintenance Due  Topic Date Due  . COVID-19 Vaccine (1) Never done  . TETANUS/TDAP  Never done  . COLONOSCOPY  Never done    Depression screen [facility] 2/9 11/27/2018  Decreased Interest 0  Down, Depressed, Hopeless 0  PHQ - 2 Score 0    Consider trying voltaren gel. Blue emu is good arthritis cream brand.
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
## 2019-09-23 LAB — LIPID PANEL
Cholesterol: 153 mg/dL (ref 0–200)
HDL: 38.8 mg/dL — ABNORMAL LOW (ref >39.00)
LDL Cholesterol: 75 mg/dL (ref 0–99)
NonHDL: 113.97
Total CHOL/HDL Ratio: 4
Triglycerides: 193 mg/dL — ABNORMAL HIGH (ref 0.0–149.0)
VLDL: 38.6 mg/dL (ref 0.0–40.0)

## 2019-09-23 LAB — COMPREHENSIVE METABOLIC PANEL
ALT: 16 U/L (ref 0–35)
AST: 25 U/L (ref 0–37)
Albumin: 4.2 g/dL (ref 3.5–5.2)
Alkaline Phosphatase: 74 U/L (ref 39–117)
BUN: 18 mg/dL (ref 6–23)
CO2: 29 mEq/L (ref 19–32)
Calcium: 9.5 mg/dL (ref 8.4–10.5)
Chloride: 103 mEq/L (ref 96–112)
Creatinine, Ser: 0.87 mg/dL (ref 0.40–1.20)
GFR: 66.26 mL/min (ref >60.00)
Glucose, Bld: 86 mg/dL (ref 70–99)
Potassium: 4.3 mEq/L (ref 3.5–5.1)
Sodium: 140 mEq/L (ref 135–145)
Total Bilirubin: 0.3 mg/dL (ref 0.2–1.2)
Total Protein: 7.2 g/dL (ref 6.0–8.3)

## 2019-09-23 LAB — CBC WITH DIFFERENTIAL/PLATELET
Basophils Absolute: 0.1 10*3/uL (ref 0.0–0.1)
Basophils Relative: 0.6 % (ref 0.0–3.0)
Eosinophils Absolute: 0.2 10*3/uL (ref 0.0–0.7)
Eosinophils Relative: 2.5 % (ref 0.0–5.0)
HCT: 33.6 % — ABNORMAL LOW (ref 36.0–46.0)
Hemoglobin: 11.5 g/dL — ABNORMAL LOW (ref 12.0–15.0)
Lymphocytes Relative: 20.3 % (ref 12.0–46.0)
Lymphs Abs: 1.7 10*3/uL (ref 0.7–4.0)
MCHC: 34.1 g/dL (ref 30.0–36.0)
MCV: 97 fl (ref 78.0–100.0)
Monocytes Absolute: 0.6 10*3/uL (ref 0.1–1.0)
Monocytes Relative: 6.6 % (ref 3.0–12.0)
Neutro Abs: 5.9 10*3/uL (ref 1.4–7.7)
Neutrophils Relative %: 70 % (ref 43.0–77.0)
Platelets: 198 10*3/uL (ref 150.0–400.0)
RBC: 3.46 Mil/uL — ABNORMAL LOW (ref 3.87–5.11)
RDW: 13.3 % (ref 11.5–15.5)
WBC: 8.4 10*3/uL (ref 4.0–10.5)

## 2019-09-23 LAB — VITAMIN D 25 HYDROXY (VIT D DEFICIENCY, FRACTURES): VITD: 40.32 ng/mL (ref 30.00–100.00)

## 2019-09-23 LAB — TSH: TSH: 1.51 u[IU]/mL (ref 0.35–4.50)

## 2019-09-23 LAB — VITAMIN B12: Vitamin B-12: 1246 pg/mL — ABNORMAL HIGH (ref 211–911)

## 2019-10-01 ENCOUNTER — Ambulatory Visit: Payer: BC Managed Care – PPO | Admitting: Orthopaedic Surgery

## 2019-10-01 ENCOUNTER — Encounter: Payer: Self-pay | Admitting: Orthopaedic Surgery

## 2019-10-01 ENCOUNTER — Ambulatory Visit: Payer: Self-pay

## 2019-10-01 VITALS — Ht 61.0 in

## 2019-10-01 DIAGNOSIS — M1711 Unilateral primary osteoarthritis, right knee: Secondary | ICD-10-CM

## 2019-10-01 DIAGNOSIS — Z6841 Body Mass Index (BMI) 40.0 and over, adult: Secondary | ICD-10-CM

## 2019-10-01 MED ORDER — BUPIVACAINE HCL 0.5 % IJ SOLN
2.0000 mL | INTRAMUSCULAR | Status: AC | PRN
  Administered 2019-10-01: 2 mL via INTRA_ARTICULAR

## 2019-10-01 MED ORDER — MELOXICAM 7.5 MG PO TABS: 7.5000 mg | ORAL_TABLET | Freq: Two times a day (BID) | ORAL | 2 refills | Status: DC | PRN

## 2019-10-01 MED ORDER — METHYLPREDNISOLONE ACETATE 40 MG/ML IJ SUSP
40.0000 mg | INTRAMUSCULAR | Status: AC | PRN
  Administered 2019-10-01: 40 mg via INTRA_ARTICULAR

## 2019-10-01 MED ORDER — LIDOCAINE HCL 1 % IJ SOLN
2.0000 mL | INTRAMUSCULAR | Status: AC | PRN
  Administered 2019-10-01: 2 mL

## 2019-10-01 NOTE — Progress Notes (Signed)
Office Visit Note   Patient: [name]           Date of Birth: 04-05-1961           MRN: [id number] Visit Date: 10/01/2019              Requested by: Wynn Banker, MD [address] [place],  [place] [address] PCP: Wynn Banker, MD   Assessment & Plan: Visit Diagnoses:  1. Primary osteoarthritis of right knee   2. Body mass index 50.0-59.9, adult (HCC)   3. Morbid obesity (HCC)     Plan: Impression is end-stage right knee DJD.  I reviewed the x-rays with the patient in detail and we had a lengthy discussion on various treatment options.  We performed a cortisone injection today.  She will also like to have a custom OA brace made for her.  Prescription for Mobic.  We also had a discussion on the importance of weight loss.  We will see her back as needed. The patient meets the AMA guidelines for Morbid (severe) obesity with a BMI > 40.0 and I have recommended weight loss.  Follow-Up Instructions: Return if symptoms worsen or fail to improve.   Orders:  Orders Placed This Encounter  Procedures  . XR KNEE 3 VIEW RIGHT   Meds ordered this encounter  Medications  . meloxicam (MOBIC) 7.5 MG tablet    Sig: Take 1 tablet (7.5 mg total) by mouth 2 (two) times daily as needed for pain.    Dispense:  30 tablet    Refill:  2      Procedures: Large Joint Inj: R knee on 10/01/2019 9:58 PM Indications: pain Details: 22 G needle  Arthrogram: No  Medications: 40 mg methylPREDNISolone acetate 40 MG/ML; 2 mL lidocaine 1 %; 2 mL bupivacaine 0.5 % Consent was given by the patient. Patient was prepped and draped in the usual sterile fashion.       Clinical Data: No additional findings.   Subjective: Chief Complaint  Patient presents with  . Right Knee - Pain    Patient is a very pleasant 58 year old female who comes in for evaluation of chronic right knee pain for years with recent worsening.  She is now having trouble sleeping.  She feels a grinding  and giving way.  She takes Aleve and Tylenol on a daily basis.  She is using topical medications as well.  Denies any mechanical symptoms.   Review of Systems  Constitutional: Negative.   HENT: Negative.   Eyes: Negative.   Respiratory: Negative.   Cardiovascular: Negative.   Endocrine: Negative.   Musculoskeletal: Negative.   Neurological: Negative.   Hematological: Negative.   Psychiatric/Behavioral: Negative.   All other systems reviewed and are negative.    Objective: Vital Signs: Ht 5\' 1"  (1.549 m)   BMI 56.50 kg/m   Physical Exam Vitals and nursing note reviewed.  Constitutional:      Appearance: She is well-developed.  HENT:     Head: Normocephalic and atraumatic.  Pulmonary:     Effort: Pulmonary effort is normal.  Abdominal:     Palpations: Abdomen is soft.  Musculoskeletal:     Cervical back: Neck supple.  Skin:    General: Skin is warm.     Capillary Refill: Capillary refill takes less than 2 seconds.  Neurological:     Mental Status: She is alert and oriented to person, place, and time.  Psychiatric:        Behavior:  Behavior normal.        Thought Content: Thought content normal.        Judgment: Judgment normal.     Ortho Exam Right knee shows no joint effusion.  2+ crepitus with range of motion with pain as well.  Collaterals and cruciates are stable. Specialty Comments:  No specialty comments available.  Imaging: XR KNEE 3 VIEW RIGHT  Result Date: 10/01/2019 Severe tricompartmental DJD with varus deformity    PMFS History: Patient Active Problem List   Diagnosis Date Noted  . Dyspnea on exertion 01/12/2019  . Hypertension 11/27/2018  . Hypothyroid 11/27/2018  . OSA on CPAP 11/27/2018   Past Medical History:  Diagnosis Date  . 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 Hypertension   . Hypothyroid   . Nephrolithiasis   . OSA (obstructive sleep apnea)     Family History  Problem Relation Age of Onset  . Hypertension Mother    . Lymphoma Mother   . Squamous cell carcinoma Father   . COPD Father   . Asthma Father   . Pancreatic cancer Father   . Diabetes Mellitus I Sister   . Heart disease Maternal Grandmother   . Heart disease Maternal Grandfather   . Thyroid disease Paternal Grandmother   . Alzheimer's disease Paternal Grandmother   . Heart disease Paternal Grandfather     Past Surgical History:  Procedure Laterality Date  . CHOLECYSTECTOMY    . ELBOW FRACTURE SURGERY    . LITHOTRIPSY Left 1995  . SCLEROTHERAPY     Social History   Occupational History  . Not on file  Tobacco Use  . Smoking status: Never Smoker  . Smokeless tobacco: Never Used  Substance and Sexual Activity  . Alcohol use: Never  . Drug use: Never  . Sexual activity: Not Currently

## 2019-11-17 ENCOUNTER — Other Ambulatory Visit: Payer: Self-pay | Admitting: Family Medicine

## 2019-11-17 DIAGNOSIS — I10 Essential (primary) hypertension: Secondary | ICD-10-CM

## 2019-12-02 ENCOUNTER — Other Ambulatory Visit: Payer: Self-pay | Admitting: Family Medicine

## 2019-12-02 DIAGNOSIS — I10 Essential (primary) hypertension: Secondary | ICD-10-CM

## 2020-01-21 ENCOUNTER — Other Ambulatory Visit: Payer: Self-pay | Admitting: Family Medicine

## 2020-01-21 MED ORDER — BETAMETHASONE DIPROPIONATE 0.05 % EX CREA: TOPICAL_CREAM | Freq: Two times a day (BID) | CUTANEOUS | 0 refills | Status: DC

## 2020-01-31 ENCOUNTER — Other Ambulatory Visit: Payer: Self-pay

## 2020-02-01 ENCOUNTER — Encounter: Payer: Self-pay | Admitting: Family Medicine

## 2020-02-01 ENCOUNTER — Ambulatory Visit: Payer: BC Managed Care – PPO | Admitting: Family Medicine

## 2020-02-01 ENCOUNTER — Other Ambulatory Visit: Payer: Self-pay

## 2020-02-01 VITALS — BP 130/80 | HR 107 | Resp 16 | Ht 61.0 in | Wt 280.5 lb

## 2020-02-01 DIAGNOSIS — L97929 Non-pressure chronic ulcer of unspecified part of left lower leg with unspecified severity: Secondary | ICD-10-CM | POA: Diagnosis not present

## 2020-02-01 DIAGNOSIS — I83029 Varicose veins of left lower extremity with ulcer of unspecified site: Secondary | ICD-10-CM | POA: Diagnosis not present

## 2020-02-01 DIAGNOSIS — R58 Hemorrhage, not elsewhere classified: Secondary | ICD-10-CM | POA: Diagnosis not present

## 2020-02-01 DIAGNOSIS — L02416 Cutaneous abscess of left lower limb: Secondary | ICD-10-CM | POA: Diagnosis not present

## 2020-02-01 MED ORDER — DOXYCYCLINE HYCLATE 100 MG PO TABS: 100.0000 mg | ORAL_TABLET | Freq: Two times a day (BID) | ORAL | 0 refills | Status: DC

## 2020-02-01 MED ORDER — SALINE 0.9 % SOLN: 0 refills | Status: DC

## 2020-02-01 NOTE — Progress Notes (Signed)
ACUTE VISIT Chief Complaint  Patient presents with  . leg issues   HPI: Ms.Kenzlei Maricle is a 59 y.o. female, who is here today with a few concerns about LLE. Left calf non pruritic rash noted about 3 days ago. [place]. She has hx of vein disease, wearing compression wraps, noted bright red rash when removed it at the end of the day. It is not tender and getting better. Negative for nose/gum bleeding,blood in stool,melena,or more bruising than usual.  Also concerned about erythematous,edematous, and tender area above left knee. She has had a lump in area for a few years.Noted after injury, she refers to lesion as a "hematoma."  This has been stable for years,not bothersome until a few days ago when it became very tender and red.She has not noted drainage. She is concerned about possible blood clot.  It has decreased in size and pain has also improved. She has not tried OTC treatments. She has not noted fever,chills,changes in appetite,CP,palpitations,or dyspnea.  2-3 weeks ago she noted small ulcer above left medial malleolus, getting bigger. Hx of venous ulcers on same area. She is concerned about possible infection. She would like to be referred to wound clinic.  Review of Systems  Constitutional: Negative for activity change, chills and fatigue.  HENT: Negative for mouth sores and sore throat.   Respiratory: Negative for cough and wheezing.   Gastrointestinal: Negative for abdominal pain, nausea and vomiting.  Genitourinary: Negative for decreased urine volume, dysuria and hematuria.  Musculoskeletal: Negative for gait problem and myalgias.  Skin: Positive for rash.  Neurological: Negative for weakness and numbness.  Rest see pertinent positives and negatives per HPI.  Current Outpatient Medications on File Prior to Visit  Medication Sig Dispense Refill  . acetaminophen (TYLENOL) 500 MG tablet Take 500 mg by  mouth as needed.    . betamethasone dipropionate 0.05 % cream Apply topically 2 (two) times daily. 30 g 0  . Calcium Carb-Cholecalciferol (CALCIUM 600 + D) 600-200 MG-UNIT TABS Take 1 tablet by mouth daily. 30 tablet 0  . ELDERBERRY PO Take by mouth.    . Ferrous Sulfate Dried (FERROUS SULFATE IRON) 200 (65 Fe) MG TABS Take 1 tablet by mouth daily. 30 tablet   . hydrochlorothiazide (HYDRODIURIL) 50 MG tablet Take 1 tablet by mouth once daily 90 tablet 0  . levothyroxine (SYNTHROID) 175 MCG tablet Take 1 tablet (175 mcg total) by mouth daily before breakfast. 90 tablet 1  . losartan (COZAAR) 100 MG tablet TAKE 1 TABLET BY MOUTH ONCE DAILY . APPOINTMENT REQUIRED FOR FUTURE REFILLS 90 tablet 0  . meloxicam (MOBIC) 7.5 MG tablet Take 1 tablet (7.5 mg total) by mouth 2 (two) times daily as needed for pain. 30 tablet 2  . Multiple Vitamins-Minerals (ONE-A-DAY MENOPAUSE FORMULA) TABS Take 1 tablet by mouth daily.    . naproxen sodium (ALEVE) 220 MG tablet Take 1 tablet (220 mg total) by mouth 2 (two) times daily as needed.    Marland Kitchen OVER THE COUNTER MEDICATION OTC antacid as needed (cannot recall name)    . vitamin B-12 (CYANOCOBALAMIN) 1000 MCG tablet Take 1,000 mcg by mouth daily.     No current facility-administered medications on file prior to visit.   Past Medical History:  Diagnosis Date  . 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 Hypertension   . Hypothyroid   . Nephrolithiasis   . OSA (  obstructive sleep apnea)    Allergies  Allergen Reactions  . Ivp Dye [Iodinated Diagnostic Agents]     Hives, hot  . Sulfa Antibiotics     Hives, hot flashes    Social History   Socioeconomic History  . Marital status: Unknown    Spouse name: Not on file  . Number of children: Not on file  . Years of education: Not on file  . Highest education level: Not on file  Occupational History  . Not on file  Tobacco Use  . Smoking status: Never Smoker  . Smokeless tobacco: Never Used  Substance  and Sexual Activity  . Alcohol use: Never  . Drug use: Never  . Sexual activity: Not Currently  Other Topics Concern  . Not on file  Social History Narrative  . Not on file   Social Determinants of Health   Financial Resource Strain:   . Difficulty of Paying Living Expenses: Not on file  Food Insecurity:   . Worried About Programme researcher, broadcasting/film/video in the Last Year: Not on file  . Ran Out of Food in the Last Year: Not on file  Transportation Needs:   . Lack of Transportation (Medical): Not on file  . Lack of Transportation (Non-Medical): Not on file  Physical Activity:   . Days of Exercise per Week: Not on file  . Minutes of Exercise per Session: Not on file  Stress:   . Feeling of Stress : Not on file  Social Connections:   . Frequency of Communication with Friends and Family: Not on file  . Frequency of Social Gatherings with Friends and Family: Not on file  . Attends Religious Services: Not on file  . Active Member of Clubs or Organizations: Not on file  . Attends Banker Meetings: Not on file  . Marital Status: Not on file   Vitals:   02/01/20 1559  BP: 130/80  Pulse: (!) 107  Resp: 16  SpO2: 97%   Body mass index is 53 kg/m.  Physical Exam Vitals and nursing note reviewed.  Constitutional:      General: She is not in acute distress.    Appearance: She is well-developed.  HENT:     Head: Normocephalic and atraumatic.  Eyes:     Conjunctiva/sclera: Conjunctivae normal.  Cardiovascular:     Rate and Rhythm: Normal rate.     Comments: Varicose veins LLE.  Hyperpigmentation post inflammatory changes. DP pulses present ,bilateral. HR 96/min Pulmonary:     Effort: Pulmonary effort is normal. No respiratory distress.     Breath sounds: Normal breath sounds.  Musculoskeletal:     Right lower leg: No edema.     Left lower leg: No edema.  Lymphadenopathy:     Cervical: No cervical adenopathy.  Skin:    General: Skin is warm.       Neurological:      Mental Status: She is alert and oriented to person, place, and time.     Gait: Gait normal.  Psychiatric:        Speech: Speech normal.     Comments: Well groomed, good eye contact.       ASSESSMENT AND PLAN:  Ms. Elleanna was seen today for leg issues.  Diagnoses and all orders for this visit:  Venous ulcer of left leg (HCC) Keep wound clean with soap and water. Edema control with LE elevation. Wet and dry dressing instructions given. Monitor for signs of infection.  -  Ambulatory referral to Wound Clinic -     Soft Lens Products (SALINE) 0.9 % SOLN; Wet dressing/gauze and cover wound, remove when gauze dries and wet it again. -     Gauze packing strips 1/4"  Abscess of left thigh It is not fluctuant or ready for I&D. Local heat may help. Oral abx recommended. Instructed about warning signs.  -     doxycycline (VIBRA-TABS) 100 MG tablet; Take 1 tablet (100 mg total) by mouth 2 (two) times daily for 7 days.  Ecchymosis Localized where she had compression wrap. Examination and hx do not suggest a serious process. I do not think blood work is needed today.   Return in about 2 weeks (around 02/15/2020) for PCP.   Brazos Sandoval G. [place], MD  [facility]. [place] office.   A few things to remember from today's visit:   Venous ulcer of left leg (HCC) - Plan: Ambulatory referral to Wound Clinic  Abscess of left thigh - Plan: doxycycline (VIBRA-TABS) 100 MG tablet  Keep wound clean with soap and water. Saline and gauze on wound to keep it wet. Monitor for signs of infection.  Please be sure medication list is accurate. If a new problem present, please set up appointment sooner than planned today.

## 2020-02-01 NOTE — Patient Instructions (Signed)
A few things to remember from today's visit:   Venous ulcer of left leg (HCC) - Plan: Ambulatory referral to Wound Clinic  Abscess of left thigh - Plan: doxycycline (VIBRA-TABS) 100 MG tablet  Keep wound clean with soap and water. Saline and gauze on wound to keep it wet. Monitor for signs of infection.  Please be sure medication list is accurate. If a new problem present, please set up appointment sooner than planned today.

## 2020-02-14 ENCOUNTER — Other Ambulatory Visit: Payer: Self-pay | Admitting: Family Medicine

## 2020-02-14 DIAGNOSIS — I10 Essential (primary) hypertension: Secondary | ICD-10-CM

## 2020-02-16 ENCOUNTER — Encounter (HOSPITAL_BASED_OUTPATIENT_CLINIC_OR_DEPARTMENT_OTHER): Payer: BC Managed Care – PPO | Attending: Physician Assistant | Admitting: Physician Assistant

## 2020-02-16 ENCOUNTER — Other Ambulatory Visit: Payer: Self-pay

## 2020-02-16 DIAGNOSIS — Z841 Family history of disorders of kidney and ureter: Secondary | ICD-10-CM | POA: Insufficient documentation

## 2020-02-16 DIAGNOSIS — L97822 Non-pressure chronic ulcer of other part of left lower leg with fat layer exposed: Secondary | ICD-10-CM | POA: Insufficient documentation

## 2020-02-16 DIAGNOSIS — Z836 Family history of other diseases of the respiratory system: Secondary | ICD-10-CM | POA: Diagnosis not present

## 2020-02-16 DIAGNOSIS — I872 Venous insufficiency (chronic) (peripheral): Secondary | ICD-10-CM | POA: Diagnosis present

## 2020-02-16 DIAGNOSIS — Z833 Family history of diabetes mellitus: Secondary | ICD-10-CM | POA: Diagnosis not present

## 2020-02-16 DIAGNOSIS — Z8249 Family history of ischemic heart disease and other diseases of the circulatory system: Secondary | ICD-10-CM | POA: Insufficient documentation

## 2020-02-16 DIAGNOSIS — I10 Essential (primary) hypertension: Secondary | ICD-10-CM | POA: Insufficient documentation

## 2020-02-16 DIAGNOSIS — Z809 Family history of malignant neoplasm, unspecified: Secondary | ICD-10-CM | POA: Diagnosis not present

## 2020-02-16 NOTE — Progress Notes (Signed)
LARAINE, SAMET ([id number]) Visit Report for 02/16/2020 Allergy List Details Patient Name: Date of Service: [name] DA 02/16/2020 9:00 A M Medical Record Number: [id number] Patient Account Number: [id number] Date of Birth/Sex: Treating RN: [birth year] (59 y.o. [name] Primary Care Maressa Apollo: Theodis Shove Other Clinician: Referring Samba Cumba: Treating Kadesia Robel/Extender: Rise Patience, [name] in Treatment: 0 Allergies Active Allergies Iodinated Contrast Media Reaction: hives Sulfa (Sulfonamide Antibiotics) Reaction: hives Allergy Notes Electronic Signature(s) Signed: 02/16/2020 5:59:08 PM By: Shawn Stall Entered By: Shawn Stall on 02/16/2020 09:16:14 -------------------------------------------------------------------------------- Arrival Information Details Patient Name: Date of Service: [name], [name] DA 02/16/2020 9:00 A M Medical Record Number: [id number] Patient Account Number: [id number] Date of Birth/Sex: Treating RN: [birth year] (59 y.o. [name] Primary Care Abriana Saltos: Theodis Shove Other Clinician: Referring Montana Fassnacht: Treating Shiv Shuey/Extender: Esaw Dace [name] in Treatment: 0 Visit Information Patient Arrived: Ambulatory Arrival Time: 09:07 Accompanied By: self Transfer Assistance: None Patient Identification Verified: Yes Secondary Verification Process Completed: Yes Patient Requires Transmission-Based Precautions: No Patient Has Alerts: Yes Patient Alerts: ABI noncompressible Electronic Signature(s) Signed: 02/16/2020 5:59:08 PM By: Shawn Stall Entered By: Shawn Stall on 02/16/2020 09:42:39 -------------------------------------------------------------------------------- Clinic Level of Care Assessment Details Patient Name: Date of Service: [name] DA 02/16/2020 9:00 A M Medical Record Number: [id number] Patient Account Number: [id number] Date of Birth/Sex: Treating  RN: March 06, 1961 (59 y.o. [name] Primary Care Analiyah Lechuga: Theodis Shove Other Clinician: Referring Arlie Posch: Treating Sahib Pella/Extender: Esaw Dace [name] in Treatment: 0 Clinic Level of Care Assessment Items TOOL 1 Quantity Score []  - 0 Use when EandM and Procedure is performed on INITIAL visit ASSESSMENTS - Nursing Assessment / Reassessment X- 1 20 General Physical Exam (combine w/ comprehensive assessment (listed just below) when performed on new pt. evals) X- 1 25 Comprehensive Assessment (HX, ROS, Risk Assessments, Wounds Hx, etc.) ASSESSMENTS - Wound and Skin Assessment / Reassessment []  - 0 Dermatologic / Skin Assessment (not related to wound area) ASSESSMENTS - Ostomy and/or Continence Assessment and Care []  - 0 Incontinence Assessment and Management []  - 0 Ostomy Care Assessment and Management (repouching, etc.) PROCESS - Coordination of Care X - Simple Patient / Family Education for ongoing care 1 15 []  - 0 Complex (extensive) Patient / Family Education for ongoing care X- 1 10 Staff obtains , Records, T Results / Process Orders est []  - 0 Staff telephones HHA, Nursing Homes / Clarify orders / etc []  - 0 Routine Transfer to another Facility (non-emergent condition) []  - 0 Routine Hospital Admission (non-emergent condition) X- 1 15 New Admissions / / Ordering NPWT Apligraf, etc. , []  - 0 Emergency Hospital Admission (emergent condition) PROCESS - Special Needs []  - 0 Pediatric / Minor Patient Management []  - 0 Isolation Patient Management []  - 0 Hearing / Language / Visual special needs []  - 0 Assessment of Community assistance (transportation, D/C planning, etc.) []  - 0 Additional assistance / Altered mentation []  - 0 Support Surface(s) Assessment (bed, cushion, seat, etc.) INTERVENTIONS - Miscellaneous []  - 0 External ear exam []  - 0 Patient Transfer (multiple staff / /  Similar devices) []  - 0 Simple Staple / Suture removal (25 or less) []  - 0 Complex Staple / Suture removal (26 or more) []  - 0 Hypo/Hyperglycemic Management (do not check if billed separately) X- 1 15 Ankle / Brachial Index (ABI) - do not check if billed separately Has the patient been seen at the hospital within the last  three years: Yes Total Score: 100 Level Of Care: New/Established - Level 3 Electronic Signature(s) Signed: 02/16/2020 5:57:36 PM By: [name] RN, BSN Signed: 02/16/2020 5:57:36 PM By: [name] RN, BSN Entered By: [name] on 02/16/2020 10:10:20 -------------------------------------------------------------------------------- Compression Therapy Details Patient Name: Date of Service: [name], [name] DA 02/16/2020 9:00 A M Medical Record Number: [id number] Patient Account Number: [id number] Date of Birth/Sex: Treating RN: 19-May-1960 (59 y.o. [name] Primary Care Aamori Mcmasters: Theodis Shove Other Clinician: Referring Janilah Hojnacki: Treating Breindy Meadow/Extender: Esaw Dace [name] in Treatment: 0 Compression Therapy Performed for Wound Assessment: Wound #1 Left,Medial Lower Leg Performed By: Clinician Yevonne Pax, RN Compression Type: Three Layer Post Procedure Diagnosis Same as Pre-procedure Electronic Signature(s) Signed: 02/16/2020 5:57:36 PM By: [name] RN, BSN Entered By: [name] on 02/16/2020 10:15:19 -------------------------------------------------------------------------------- Encounter Discharge Information Details Patient Name: Date of Service: [name], [name] DA 02/16/2020 9:00 A M Medical Record Number: [id number] Patient Account Number: [id number] Date of Birth/Sex: Treating RN: 1960/05/21 (59 y.o. [name] Primary Care March Steyer: Theodis Shove Other Clinician: Referring Denali [name]: Treating Alize Acy/Extender: Esaw Dace [name] in Treatment:  0 Encounter Discharge Information Items Post Procedure Vitals Discharge Condition: Stable Temperature (F): 98.2 Ambulatory Status: Ambulatory Pulse (bpm): 84 Discharge Destination: Home Respiratory Rate (breaths/min): 18 Transportation: Private Auto Blood Pressure (mmHg): 137/83 Accompanied By: self Schedule Follow-up Appointment: Yes Clinical Summary of Care: Patient Declined Electronic Signature(s) Signed: 02/16/2020 5:51:14 PM By: Yevonne Pax RN Entered By: Yevonne Pax on 02/16/2020 10:37:47 -------------------------------------------------------------------------------- Lower Extremity Assessment Details Patient Name: Date of Service: [name] DA 02/16/2020 9:00 A M Medical Record Number: [id number] Patient Account Number: [id number] Date of Birth/Sex: Treating RN: Dec 18, 1960 (59 y.o. [name], [name] Primary Care Evangela Heffler: Theodis Shove Other Clinician: Referring Xylah Early: Treating Zoanne Newill/Extender: Esaw Dace [name] in Treatment: 0 Edema Assessment Assessed: [Left: Yes] [Right: No] Edema: [Left: Ye] [Right: s] Calf Left: Right: Point of Measurement: 35 cm From Medial Instep 42 cm Ankle Left: Right: Point of Measurement: 9 cm From Medial Instep 23 cm Vascular Assessment Pulses: Dorsalis Pedis Palpable: [Left:Yes] Doppler Audible: [Left:Yes] Posterior Tibial Palpable: [Left:Yes Yes] Notes >231mmHg when performing ABIs- noncompressible. Electronic Signature(s) Signed: 02/16/2020 5:59:08 PM By: Shawn Stall Entered By: Shawn Stall on 02/16/2020 09:40:54 -------------------------------------------------------------------------------- Multi-Disciplinary Care Plan Details Patient Name: Date of Service: [name], [name] DA 02/16/2020 9:00 A M Medical Record Number: [id number] Patient Account Number: [id number] Date of Birth/Sex: Treating RN: 03/20/1961 (59 y.o. [name] Primary Care Karin Griffith: Theodis Shove Other  Clinician: Referring Clinten Howk: Treating Shakai Dolley/Extender: Esaw Dace [name] in Treatment: 0 Active Inactive Venous Leg Ulcer Nursing Diagnoses: Knowledge deficit related to disease process and management Potential for venous Insuffiency (use before diagnosis confirmed) Goals: Patient will maintain optimal edema control Date Initiated: 02/16/2020 Target Resolution Date: 03/16/2020 Goal Status: Active Interventions: Assess peripheral edema status every visit. Compression as ordered Provide education on venous insufficiency Treatment Activities: Therapeutic compression applied : 02/16/2020 Notes: Wound/Skin Impairment Nursing Diagnoses: Impaired tissue integrity Knowledge deficit related to ulceration/compromised skin integrity Goals: Patient/caregiver will verbalize understanding of skin care regimen Date Initiated: 02/16/2020 Target Resolution Date: 03/16/2020 Goal Status: Active Ulcer/skin breakdown will have a volume reduction of 30% by week 4 Date Initiated: 02/16/2020 Target Resolution Date: 03/15/2020 Goal Status: Active Interventions: Assess patient/caregiver ability to obtain necessary supplies Assess patient/caregiver ability to perform ulcer/skin care regimen upon admission and as needed Assess ulceration(s) every visit Provide education on ulcer and  skin care Treatment Activities: Skin care regimen initiated : 02/16/2020 Topical wound management initiated : 02/16/2020 Notes: Electronic Signature(s) Signed: 02/16/2020 5:57:36 PM By: Zenaida DeedBoehlein, [name] RN, BSN Entered By: Zenaida DeedBoehlein, [name] on 02/16/2020 10:09:10 -------------------------------------------------------------------------------- Pain Assessment Details Patient Name: Date of Service: [name] MaduraWA [name], [name] PlumSURA DA 02/16/2020 9:00 A M Medical Record Number: [id number] Patient Account Number: [id number] Date of Birth/Sex: Treating RN: 06/26/1960 (59 y.o. [name] SilenceF) Deaton, [name] Primary Care Tyshauna Finkbiner:  Theodis ShoveKoberlein, [name] Other Clinician: Referring Sladen Plancarte: Treating Heraclio Seidman/Extender: Esaw DaceStone III, [name], [name] in Treatment: 0 Active Problems Location of Pain Severity and Description of Pain Patient Has Paino No Site Locations Rate the pain. Current Pain Level: 0 Pain Management and Medication Current Pain Management: Medication: No Cold Application: No Rest: No Massage: No Activity: No T.E.N.S.: No Heat Application: No Leg drop or elevation: No Is the Current Pain Management Adequate: Adequate How does your wound impact your activities of daily livingo Sleep: No Bathing: No Appetite: No Relationship With Others: No Bladder Continence: No Emotions: No Bowel Continence: No Work: No Toileting: No Drive: No Dressing: No Hobbies: No Electronic Signature(s) Signed: 02/16/2020 5:59:08 PM By: Shawn Stalleaton, [name] Entered By: Shawn Stalleaton, [name] on 02/16/2020 09:17:54 -------------------------------------------------------------------------------- Patient/Caregiver Education Details Patient Name: Date of Service: [name] MaduraWA [name], [name] DA 11/10/2021andnbsp9:00 A M Medical Record Number: [id number] Patient Account Number: [id number] Date of Birth/Gender: Treating RN: 12/21/1960 (59 y.o. [name] StandardF) Boehlein, [name] Primary Care Physician: Theodis ShoveKoberlein, [name] Other Clinician: Referring Physician: Treating Physician/Extender: [name] SquiresStone III, [name], [name] in Treatment: 0 Education Assessment Education Provided To: Patient Education Topics Provided Venous: Handouts: Controlling Swelling with Multilayered Compression Wraps Methods: Explain/Verbal, Printed Responses: Reinforcements needed, State content correctly Welcome [facility]: o Handouts: Welcome [facility] o Methods: Explain/Verbal, Printed Responses: Reinforcements needed, State content correctly Electronic Signature(s) Signed: 02/16/2020 5:57:36 PM By: Zenaida DeedBoehlein, [name] RN, BSN Entered By:  Zenaida DeedBoehlein, [name] on 02/16/2020 10:09:45 -------------------------------------------------------------------------------- Wound Assessment Details Patient Name: Date of Service: [name] MaduraWA [name], [name] PlumSURA DA 02/16/2020 9:00 A M Medical Record Number: [id number] Patient Account Number: [id number] Date of Birth/Sex: Treating RN: 02/13/1961 (59 y.o. [name] PickettF) Deaton, Millard.LoaBobbi Primary Care Rudra Hobbins: Theodis ShoveKoberlein, [name] Other Clinician: Referring Rojean Ige: Treating Sitara Cashwell/Extender: Esaw DaceStone III, [name], [name] in Treatment: 0 Wound Status Wound Number: 1 Primary Venous Leg Ulcer Etiology: Wound Location: Left, Medial Lower Leg Wound Status: Open Wounding Event: Gradually Appeared Comorbid Anemia, Sleep Apnea, Hypertension, Peripheral Venous Date Acquired: 01/26/2020 History: Disease [name] Of Treatment: 0 Clustered Wound: No Wound Measurements Length: (cm) 3.3 Width: (cm) 1.3 Depth: (cm) 0.3 Area: (cm) 3.369 Volume: (cm) 1.011 % Reduction in Area: % Reduction in Volume: Epithelialization: None Tunneling: No Undermining: No Wound Description Classification: Unclassifiable Wound Margin: Distinct, outline attached Exudate Amount: Medium Exudate Type: Serosanguineous Exudate Color: red, brown Foul Odor After Cleansing: No Slough/Fibrino Yes Wound Bed Granulation Amount: None Present (0%) Exposed Structure Necrotic Amount: Large (67-100%) Fascia Exposed: No Necrotic Quality: Adherent Slough Fat Layer (Subcutaneous Tissue) Exposed: No Tendon Exposed: No Muscle Exposed: No Joint Exposed: No Bone Exposed: No Treatment Notes Wound #1 (Left, Medial Lower Leg) 1. Cleanse With Wound Cleanser 2. Periwound Care Moisturizing lotion 3. Primary Dressing Applied Iodoflex 4. Secondary Dressing Dry Gauze 6. Support Layer Applied 3 layer compression wrap Notes netting , mupricion to thigh area Electronic Signature(s) Signed: 02/16/2020 5:59:08 PM By: Shawn Stalleaton, [name] Entered By: Shawn Stalleaton,  [name] on 02/16/2020 09:41:59 -------------------------------------------------------------------------------- Vitals Details Patient Name: Date of Service: [name] MaduraWA [name], [name] DA 02/16/2020 9:00 A M Medical Record Number:  [id number] Patient Account Number: [id number] Date of Birth/Sex: Treating RN: 1960/11/09 (59 y.o. [name], [name] Primary Care Ericah Scotto: Theodis Shove Other Clinician: Referring Jiles Goya: Treating Jnae Thomaston/Extender: Esaw Dace [name] in Treatment: 0 Vital Signs Time Taken: 09:08 Temperature (F): 98.2 Height (in): 64 Pulse (bpm): 84 Source: Stated Respiratory Rate (breaths/min): 18 Weight (lbs): 278 Blood Pressure (mmHg): 137/83 Source: Stated Reference Range: 80 - 120 mg / dl Body Mass Index (BMI): 47.7 Electronic Signature(s) Signed: 02/16/2020 5:59:08 PM By: Shawn Stall Entered By: Shawn Stall on 02/16/2020 09:15:03

## 2020-02-16 NOTE — Progress Notes (Signed)
KEYMONI, MCCASTER ([id number]) Visit Report for 02/16/2020 Abuse/Suicide Risk Screen Details Patient Name: Date of Service: [name] 02/16/2020 9:00 A M Medical Record Number: [id number] Patient Account Number: [id number] Date of Birth/Sex: Treating RN: Apr 06, 1961 (59 y.o. [name] Primary Care Ellisa Devivo: Theodis Shove Other Clinician: Referring Elam Ellis: Treating Shirley Bolle/Extender: [name] in Treatment: 0 Abuse/Suicide Risk Screen Items Answer ABUSE RISK SCREEN: Has anyone close to you tried to hurt or harm you recentlyo No Do you feel uncomfortable with anyone in your familyo No Has anyone forced you do things that you didnt want to doo No Electronic Signature(s) Signed: 02/16/2020 5:59:08 PM By: Shawn Stall Entered By: Shawn Stall on 02/16/2020 09:16:24 -------------------------------------------------------------------------------- Activities of Daily Living Details Patient Name: Date of Service: [name] 02/16/2020 9:00 A M Medical Record Number: [id number] Patient Account Number: [id number] Date of Birth/Sex: Treating RN: 1960-12-13 (59 y.o. [name] Primary Care Mikahla Wisor: Theodis Shove Other Clinician: Referring Scotlynn Noyes: Treating Aryam Zhan/Extender: [name] in Treatment: 0 Activities of Daily Living Items Answer Activities of Daily Living (Please select one for each item) Drive Automobile Completely Able T Medications ake Completely Able Use T elephone Completely Able Care for Appearance Completely Able Use T oilet Completely Able Bath / Shower Completely Able Dress Self Completely Able Feed Self Completely Able Walk Completely Able Get In / Out Bed Completely Able Housework Completely Able Prepare Meals Completely Able Handle Money Completely Able Shop for Self Completely Able Electronic Signature(s) Signed: 02/16/2020 5:59:08 PM By: Shawn Stall Entered  By: Shawn Stall on 02/16/2020 09:16:40 -------------------------------------------------------------------------------- Education Screening Details Patient Name: Date of Service: [name], [name] 02/16/2020 9:00 A M Medical Record Number: [id number] Patient Account Number: [id number] Date of Birth/Sex: Treating RN: [birth year] (59 y.o. [name] Primary Care Adrik Khim: Theodis Shove Other Clinician: Referring Monesha Monreal: Treating Eyvette Cordon/Extender: Eliseo Squires in Treatment: 0 Primary Learner Assessed: Patient Learning Preferences/Education Level/Primary Language Learning Preference: Explanation, Demonstration, Printed Material Highest Education Level: College or Above Preferred Language: English Cognitive Barrier Language Barrier: No Translator Needed: No Memory Deficit: No Emotional Barrier: No Cultural/Religious Beliefs Affecting Medical Care: No Physical Barrier Impaired Vision: Yes Glasses Impaired Hearing: No Decreased Hand dexterity: No Knowledge/Comprehension Knowledge Level: High Comprehension Level: High Ability to understand written instructions: High Ability to understand verbal instructions: High Motivation Anxiety Level: Calm Cooperation: Cooperative Education Importance: Acknowledges Need Interest in Health Problems: Asks Questions Perception: Coherent Willingness to Engage in Self-Management High Activities: Readiness to Engage in Self-Management High Activities: Electronic Signature(s) Signed: 02/16/2020 5:59:08 PM By: Shawn Stall Entered By: Shawn Stall on 02/16/2020 09:16:57 -------------------------------------------------------------------------------- Fall Risk Assessment Details Patient Name: Date of Service: [name], [name] 02/16/2020 9:00 A M Medical Record Number: [id number] Patient Account Number: [id number] Date of Birth/Sex: Treating RN: 02/18/1961 (59 y.o. Debara Pickett, Millard.Loa Primary Care  Shlomie Romig: Theodis Shove Other Clinician: Referring Betzy Barbier: Treating Keshia Weare/Extender: [name] in Treatment: 0 Fall Risk Assessment Items Have you had 2 or more falls in the last 12 monthso 0 No Have you had any fall that resulted in injury in the last 12 monthso 0 No FALLS RISK SCREEN History of falling - immediate or within 3 months 0 No Secondary diagnosis (Do you have 2 or more medical diagnoseso) 0 No Ambulatory aid None/bed rest/wheelchair/nurse 0 Yes Crutches/cane/walker 0 No Furniture 0 No Intravenous therapy Access/Saline/Heparin Lock 0 No Gait/Transferring Normal/ bed rest/ wheelchair 0 Yes Weak (  short steps with or without shuffle, stooped but able to lift head while walking, may seek 0 No support from furniture) Impaired (short steps with shuffle, may have difficulty arising from chair, head down, impaired 0 No balance) Mental Status Oriented to own ability 0 Yes Electronic Signature(s) Signed: 02/16/2020 5:59:08 PM By: Shawn Stall Entered By: Shawn Stall on 02/16/2020 09:17:14 -------------------------------------------------------------------------------- Foot Assessment Details Patient Name: Date of Service: [name], [name] 02/16/2020 9:00 A M Medical Record Number: [id number] Patient Account Number: [id number] Date of Birth/Sex: Treating RN: 1960/05/06 (59 y.o. [name] Primary Care Mayrani Khamis: Theodis Shove Other Clinician: Referring Kyliee Ortego: Treating Jovie Swanner/Extender: [name] in Treatment: 0 Foot Assessment Items Site Locations + = Sensation present, - = Sensation absent, C = Callus, U = Ulcer R = Redness, W = Warmth, M = Maceration, PU = Pre-ulcerative lesion F = Fissure, S = Swelling, D = Dryness Assessment Right: Left: Other Deformity: No No Prior Foot Ulcer: No No Prior Amputation: No No Charcot Joint: No No Ambulatory Status: Ambulatory Without Help Gait:  Steady Electronic Signature(s) Signed: 02/16/2020 5:59:08 PM By: Shawn Stall Entered By: Shawn Stall on 02/16/2020 09:39:48 -------------------------------------------------------------------------------- Nutrition Risk Screening Details Patient Name: Date of Service: [name] 02/16/2020 9:00 A M Medical Record Number: [id number] Patient Account Number: [id number] Date of Birth/Sex: Treating RN: 12-17-1960 (59 y.o. Debara Pickett, Millard.Loa Primary Care Rogene Meth: Theodis Shove Other Clinician: Referring Chelise Hanger: Treating Jennelle Pinkstaff/Extender: Rise Patience, Junell Weeks in Treatment: 0 Height (in): 64 Weight (lbs): 278 Body Mass Index (BMI): 47.7 Nutrition Risk Screening Items Score Screening NUTRITION RISK SCREEN: I have an illness or condition that made me change the kind and/or amount of food I eat 2 Yes I eat fewer than two meals per day 0 No I eat few fruits and vegetables, or milk products 0 No I have three or more drinks of beer, liquor or wine almost every day 0 No I have tooth or mouth problems that make it hard for me to eat 0 No I don't always have enough money to buy the food I need 0 No I eat alone most of the time 0 No I take three or more different prescribed or over-the-counter drugs a day 1 Yes Without wanting to, I have lost or gained 10 pounds in the last six months 0 No I am not always physically able to shop, cook and/or feed myself 0 No Nutrition Protocols Good Risk Protocol Moderate Risk Protocol 0 Provide education on nutrition High Risk Proctocol Risk Level: Moderate Risk Score: 3 Electronic Signature(s) Signed: 02/16/2020 5:59:08 PM By: Shawn Stall Entered By: Shawn Stall on 02/16/2020 09:17:28

## 2020-02-16 NOTE — Progress Notes (Signed)
SOPHEAP, BASIC ([id number]) Visit Report for 02/16/2020 Chief Complaint Document Details Patient Name: Date of Service: [name] DA 02/16/2020 9:00 A M Medical Record Number: [id number] Patient Account Number: [id number] Date of Birth/Sex: Treating RN: July 08, 1960 (59 y.o. [name] Primary Care Provider: Theodis Shove Other Clinician: Referring Provider: Treating Provider/Extender: [name] in Treatment: 0 Information Obtained from: Patient Chief Complaint Left LE Ulcer Electronic Signature(s) Signed: 02/16/2020 9:55:28 AM By: Lenda Kelp PA-C Entered By: Lenda Kelp on 02/16/2020 09:55:27 -------------------------------------------------------------------------------- Debridement Details Patient Name: Date of Service: [place], [name] DA 02/16/2020 9:00 A M Medical Record Number: [id number] Patient Account Number: [id number] Date of Birth/Sex: Treating RN: Nov 26, 1960 (59 y.o. [name] Primary Care Provider: Theodis Shove Other Clinician: Referring Provider: Treating Provider/Extender: [name] in Treatment: 0 Debridement Performed for Assessment: Wound #1 Left,Medial Lower Leg Performed By: Physician Lenda Kelp, PA Debridement Type: Debridement Severity of Tissue Pre Debridement: Fat layer exposed Level of Consciousness (Pre-procedure): Awake and Alert Pre-procedure Verification/Time Out Yes - 10:10 Taken: Start Time: 10:11 Pain Control: Lidocaine 5% topical ointment T Area Debrided (L x W): otal 3.3 (cm) x 1.3 (cm) = 4.29 (cm) Tissue and other material debrided: Viable, Non-Viable, Slough, Subcutaneous, Slough Level: Skin/Subcutaneous Tissue Debridement Description: Excisional Instrument: Curette Bleeding: Minimum Hemostasis Achieved: Pressure End Time: 10:16 Procedural Pain: 4 Post Procedural Pain: 0 Response to Treatment: Procedure was tolerated well Level of  Consciousness (Post- Awake and Alert procedure): Post Debridement Measurements of Total Wound Length: (cm) 3.3 Width: (cm) 1.3 Depth: (cm) 0.3 Volume: (cm) 1.011 Character of Wound/Ulcer Post Debridement: Requires Further Debridement Severity of Tissue Post Debridement: Fat layer exposed Post Procedure Diagnosis Same as Pre-procedure Electronic Signature(s) Signed: 02/16/2020 5:47:40 PM By: Lenda Kelp PA-C Signed: 02/16/2020 5:57:36 PM By: [name] RN, BSN Entered By: [name] on 02/16/2020 10:14:36 -------------------------------------------------------------------------------- HPI Details Patient Name: Date of Service: [name], [place] DA 02/16/2020 9:00 A M Medical Record Number: [id number] Patient Account Number: [id number] Date of Birth/Sex: Treating RN: [birth year] (59 y.o. [name] Primary Care Provider: Theodis Shove Other Clinician: Referring Provider: Treating Provider/Extender: [name] in Treatment: 0 History of Present Illness HPI Description: 02/16/2020 upon evaluation today patient actually appears to be doing poorly in regard to her left medial lower extremity ulcer. This is actually an area that she tells me she has had intermittent issues with over the years although has been closed for some time she typically uses compression right now she has juxta lite compression wraps. With that being said she tells me that this nonetheless open several weeks/months ago and has been given her trouble since. She does have a history of chronic venous insufficiency she is seeing specialist for this in the past she has had an ablation as well as sclerotherapy. With that being said she also has hypertension chronically which is managed by her primary care provider. In general she seems to be worsening overall with regard to the wound and states that she finally realized that she needed to come in and have somebody look at  this and not continue to try to manage this on her own. No fevers, chills, nausea, vomiting, or diarrhea. Electronic Signature(s) Signed: 02/16/2020 1:06:58 PM By: Lenda Kelp PA-C Entered By: Lenda Kelp on 02/16/2020 13:06:58 -------------------------------------------------------------------------------- Physical Exam Details Patient Name: Date of Service: [name] DA 02/16/2020 9:00 A M Medical Record Number: [id number]  Patient Account Number: [id number] Date of Birth/Sex: Treating RN: 08/14/1960 (59 y.o. [name]) Boehlein, [name] Primary Care Provider: Theodis ShoveKoberlein, Junell Other Clinician: Referring Provider: Treating Provider/Extender: Esaw DaceStone III, Jordann Grime Koberlein, Junell Weeks in Treatment: 0 Constitutional sitting or standing blood pressure is within target range for patient.. pulse regular and within target range for patient.Marland Kitchen. respirations regular, non-labored and within target range for patient.Marland Kitchen. temperature within target range for patient.. Well-nourished and well-hydrated in no acute distress. Eyes conjunctiva clear no eyelid edema noted. pupils equal round and reactive to light and accommodation. Ears, Nose, Mouth, and Throat no gross abnormality of ear auricles or external auditory canals. normal hearing noted during conversation. mucus membranes moist. Respiratory normal breathing without difficulty. Cardiovascular 2+ dorsalis pedis/posterior tibialis pulses. 1+ pitting edema of the bilateral lower extremities. Musculoskeletal normal gait and posture. no significant deformity or arthritic changes, no loss or range of motion, no clubbing. Psychiatric this patient is able to make decisions and demonstrates good insight into disease process. Alert and Oriented x 3. pleasant and cooperative. Notes Upon inspection patient's wound bed actually showed signs of some thick leathery slough buildup on the surface of the wound. She actually did require sharp debridement and I  performed debridement today without complication post debridement the wound bed appears to be doing much better which is great news. There is no sign of active infection at this time which is also good news. In general I am extremely pleased with where things stand. Electronic Signature(s) Signed: 02/16/2020 1:07:34 PM By: Lenda KelpStone III, Doyt Castellana PA-C Entered By: Lenda KelpStone III, Scarlette Hogston on 02/16/2020 13:07:33 -------------------------------------------------------------------------------- Physician Orders Details Patient Name: Date of Service: [name] 02/16/2020 9:00 A M Medical Record Number: [id number] Patient Account Number: [id number] Date of Birth/Sex: Treating RN: 12/27/1960 (59 y.o. [name]) Boehlein, [name] Primary Care Provider: Theodis ShoveKoberlein, Junell Other Clinician: Referring Provider: Treating Provider/Extender: Esaw DaceStone III, Naomii Kreger Koberlein, Junell Weeks in Treatment: 0 Verbal / Phone Orders: No Diagnosis Coding ICD-10 Coding Code Description I87.2 Venous insufficiency (chronic) (peripheral) L97.822 Non-pressure chronic ulcer of other part of left lower leg with fat layer exposed I10 Essential (primary) hypertension Follow-up Appointments Return Appointment in 1 week. Dressing Change Frequency Do not change entire dressing for one week. Skin Barriers/Peri-Wound Care Moisturizing lotion - to leg Other: - mupirocin cream to left thigh nodule 2 times per day Wound Cleansing May shower with protection. Primary Wound Dressing Wound #1 Left,Medial Lower Leg Iodoflex Secondary Dressing Wound #1 Left,Medial Lower Leg Dry Gauze Edema Control 3 Layer Compression System - Left Lower Extremity Avoid standing for long periods of time Elevate legs to the level of the heart or above for 30 minutes daily and/or when sitting, a frequency of: - throughout the day Exercise regularly Support Garment 20-30 mm/Hg pressure to: - circaid juxtalite right leg daily Patient Medications llergies: Iodinated  Contrast Media, Sulfa (Sulfonamide Antibiotics) A Notifications Medication Indication Start End 02/16/2020 mupirocin DOSE topical 2 % ointment - ointment topical applied 2 times per day to the red/infected region of the left thigh x 30 days or until healed Electronic Signature(s) Signed: 02/16/2020 10:32:44 AM By: Lenda KelpStone III, Albertina Leise PA-C Entered By: Lenda KelpStone III, Shirla Hodgkiss on 02/16/2020 10:32:43 -------------------------------------------------------------------------------- Problem List Details Patient Name: Date of Service: [name] [name], [place] DA 02/16/2020 9:00 A M Medical Record Number: [id number] Patient Account Number: [id number] Date of Birth/Sex: Treating RN: 01/23/1961 (59 y.o. [name]) Boehlein, [name] Primary Care Provider: Theodis ShoveKoberlein, Junell Other Clinician: Referring Provider: Treating Provider/Extender: Esaw DaceStone III, Nakyah Erdmann Koberlein, Junell Weeks in Treatment: 0 Active  Problems ICD-10 Encounter Code Description Active Date MDM Diagnosis I87.2 Venous insufficiency (chronic) (peripheral) 02/16/2020 No Yes L97.822 Non-pressure chronic ulcer of other part of left lower leg with fat layer 02/16/2020 No Yes exposed I10 Essential (primary) hypertension 02/16/2020 No Yes Inactive Problems Resolved Problems Electronic Signature(s) Signed: 02/16/2020 9:54:51 AM By: Lenda Kelp PA-C Entered By: Lenda Kelp on 02/16/2020 09:54:51 -------------------------------------------------------------------------------- Progress Note Details Patient Name: Date of Service: [place], [name] DA 02/16/2020 9:00 A M Medical Record Number: [id number] Patient Account Number: [id number] Date of Birth/Sex: Treating RN: May 25, 1960 (59 y.o. [name] Primary Care Provider: Theodis Shove Other Clinician: Referring Provider: Treating Provider/Extender: [name] in Treatment: 0 Subjective Chief Complaint Information obtained from Patient Left LE Ulcer History of  Present Illness (HPI) 02/16/2020 upon evaluation today patient actually appears to be doing poorly in regard to her left medial lower extremity ulcer. This is actually an area that she tells me she has had intermittent issues with over the years although has been closed for some time she typically uses compression right now she has juxta lite compression wraps. With that being said she tells me that this nonetheless open several weeks/months ago and has been given her trouble since. She does have a history of chronic venous insufficiency she is seeing specialist for this in the past she has had an ablation as well as sclerotherapy. With that being said she also has hypertension chronically which is managed by her primary care provider. In general she seems to be worsening overall with regard to the wound and states that she finally realized that she needed to come in and have somebody look at this and not continue to try to manage this on her own. No fevers, chills, nausea, vomiting, or diarrhea. Patient History Information obtained from Patient. Allergies Iodinated Contrast Media (Reaction: hives), Sulfa (Sulfonamide Antibiotics) (Reaction: hives) Family History Cancer - Mother, Diabetes - Father, Hypertension - Mother, Kidney Disease - Mother, Lung Disease - Father, Thyroid Problems - Paternal Grandparents, No family history of Heart Disease, Seizures, Stroke, Tuberculosis. Social History Never smoker, Marital Status - Married, Alcohol Use - Never, Drug Use - No History, Caffeine Use - Daily - coffee. Medical History Eyes Denies history of Cataracts, Glaucoma, Optic Neuritis Ear/Nose/Mouth/Throat Denies history of Chronic sinus problems/congestion, Middle ear problems Hematologic/Lymphatic Patient has history of Anemia - iron Denies history of Hemophilia, Human Immunodeficiency Virus, Lymphedema, Sickle Cell Disease Respiratory Patient has history of Sleep Apnea - CPAP Denies history of  Aspiration, Asthma, Chronic Obstructive Pulmonary Disease (COPD), Pneumothorax, Tuberculosis Cardiovascular Patient has history of Hypertension, Peripheral Venous Disease Denies history of Angina, Arrhythmia, Congestive Heart Failure, Coronary Artery Disease, Deep Vein Thrombosis, Hypotension, Myocardial Infarction, Peripheral Arterial Disease, Phlebitis, Vasculitis Gastrointestinal Denies history of Cirrhosis , Colitis, Crohnoos, Hepatitis A, Hepatitis B, Hepatitis C Endocrine Denies history of Type I Diabetes, Type II Diabetes Genitourinary Denies history of End Stage Renal Disease Immunological Denies history of Lupus Erythematosus, Raynaudoos, Scleroderma Integumentary (Skin) Denies history of History of Burn Musculoskeletal Denies history of Gout, Rheumatoid Arthritis, Osteoarthritis, Osteomyelitis Neurologic Denies history of Dementia, Neuropathy, Quadriplegia, Paraplegia, Seizure Disorder Oncologic Denies history of Received Chemotherapy, Received Radiation Psychiatric Denies history of Anorexia/bulimia, Confinement Anxiety Hospitalization/Surgery History - cholecystectomy 1980s. - nephrolithasis 1980s. - plate and rod in right elbow surgery 2010. 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 Eyes Complains or has symptoms of Glasses / Contacts - glasses. Denies complaints or symptoms of Dry Eyes, Vision Changes. Ear/Nose/Mouth/Throat Denies complaints or symptoms of Chronic sinus problems or rhinitis. Respiratory Denies complaints or symptoms of Chronic or frequent coughs, Shortness of Breath. Cardiovascular Denies complaints or symptoms of Chest pain. Gastrointestinal Denies complaints or symptoms of Frequent diarrhea, Nausea,  Vomiting. Endocrine Denies complaints or symptoms of Heat/cold intolerance. Genitourinary Denies complaints or symptoms of Frequent urination. Integumentary (Skin) Complains or has symptoms of Wounds - left leg. Musculoskeletal Denies complaints or symptoms of Muscle Pain, Muscle Weakness. Neurologic Denies complaints or symptoms of Numbness/parasthesias. Psychiatric Denies complaints or symptoms of Claustrophobia, Suicidal. Objective Constitutional sitting or standing blood pressure is within target range for patient.. pulse regular and within target range for patient.Marland Kitchen respirations regular, non-labored and within target range for patient.Marland Kitchen temperature within target range for patient.. Well-nourished and well-hydrated in no acute distress. Vitals Time Taken: 9:08 AM, Height: 64 in, Source: Stated, Weight: 278 lbs, Source: Stated, BMI: 47.7, Temperature: 98.2 F, Pulse: 84 bpm, Respiratory Rate: 18 breaths/min, Blood Pressure: 137/83 mmHg. Eyes conjunctiva clear no eyelid edema noted. pupils equal round and reactive to light and accommodation. Ears, Nose, Mouth, and Throat no gross abnormality of ear auricles or external auditory canals. normal hearing noted during conversation. mucus membranes moist. Respiratory normal breathing without difficulty. Cardiovascular 2+ dorsalis pedis/posterior tibialis pulses. 1+ pitting edema of the bilateral lower extremities. Musculoskeletal normal gait and posture. no significant deformity or arthritic changes, no loss or range of motion, no clubbing. Psychiatric this patient is able to make decisions and demonstrates good insight into disease process. Alert and Oriented x 3. pleasant and cooperative. General Notes: Upon inspection patient's wound bed actually showed signs of some thick leathery slough buildup on the surface of the wound. She actually did require sharp debridement and I performed debridement today without complication post  debridement the wound bed appears to be doing much better which is great news. There is no sign of active infection at this time which is also good news. In general I am extremely pleased with where things stand. Integumentary (Hair, Skin) Wound #1 status is Open. Original cause of wound was Gradually Appeared. The wound is located on the Left,Medial Lower Leg. The wound measures 3.3cm length x 1.3cm width x 0.3cm depth; 3.369cm^2 area and 1.011cm^3 volume. There is no tunneling or undermining noted. There is a medium amount of serosanguineous drainage noted. The wound margin is distinct with the outline attached to the wound base. There is no granulation within the wound bed. There is a large (67-100%) amount of necrotic tissue within the wound bed including Adherent Slough. Assessment Active Problems ICD-10 Venous insufficiency (chronic) (peripheral) Non-pressure chronic ulcer of other part of left lower leg with fat layer exposed Essential (primary) hypertension Procedures Wound #1 Pre-procedure diagnosis of Wound #1 is a Venous Leg Ulcer located on the Left,Medial Lower Leg .Severity of Tissue Pre Debridement is: Fat layer exposed. There was a Excisional Skin/Subcutaneous Tissue Debridement with a total area of 4.29 sq cm performed by Lenda Kelp, PA. With the following instrument(s): Curette to remove Viable and Non-Viable tissue/material. Material removed includes Subcutaneous Tissue and Slough and after achieving pain control using Lidocaine 5% topical ointment. No specimens were taken. A time out was conducted at 10:10, prior to the start of the procedure. A Minimum amount of bleeding was controlled with Pressure. The procedure was  tolerated well with a pain level of 4 throughout and a pain level of 0 following the procedure. Post Debridement Measurements: 3.3cm length x 1.3cm width x 0.3cm depth; 1.011cm^3 volume. Character of Wound/Ulcer Post Debridement requires further  debridement. Severity of Tissue Post Debridement is: Fat layer exposed. Post procedure Diagnosis Wound #1: Same as Pre-Procedure Pre-procedure diagnosis of Wound #1 is a Venous Leg Ulcer located on the Left,Medial Lower Leg . There was a Three Layer Compression Therapy Procedure by Yevonne Pax, RN. Post procedure Diagnosis Wound #1: Same as Pre-Procedure Plan Follow-up Appointments: Return Appointment in 1 week. Dressing Change Frequency: Do not change entire dressing for one week. Skin Barriers/Peri-Wound Care: Moisturizing lotion - to leg Other: - mupirocin cream to left thigh nodule 2 times per day Wound Cleansing: May shower with protection. Primary Wound Dressing: Wound #1 Left,Medial Lower Leg: Iodoflex Secondary Dressing: Wound #1 Left,Medial Lower Leg: Dry Gauze Edema Control: 3 Layer Compression System - Left Lower Extremity Avoid standing for long periods of time Elevate legs to the level of the heart or above for 30 minutes daily and/or when sitting, a frequency of: - throughout the day Exercise regularly Support Garment 20-30 mm/Hg pressure to: - circaid juxtalite right leg daily The following medication(s) was prescribed: mupirocin topical 2 % ointment ointment topical applied 2 times per day to the red/infected region of the left thigh x 30 days or until healed starting 02/16/2020 1 I would recommend at this time to continue helping to clean up the surface of the wound that we initiate treatment with Iodoflex/Iodosorb in order to try to help out in this regard. 2. I am also going to suggest a 3 layer compression wrap be initiated I think this is can work better than the juxta light wraps while she has an active open wound. 3. I am also can recommend the patient elevate her legs much as possible try to keep edema under control I think this is very important. 4. I am also can recommend that she use mupirocin ointment topically which I did send into the pharmacy to the  erythematous/potential abscess area on the left thigh this is not something that is actually open at this point hopefully will not open but if anything changes in this regard she will definitely let me know we could consider a different oral antibiotics she has been on doxycycline for this but again is still erythematous if we need to will make adjustments as needed in the plan when I see her back next week. We will see patient back for reevaluation in 1 week here in the clinic. If anything worsens or changes patient will contact our office for additional recommendations. Electronic Signature(s) Signed: 02/16/2020 1:08:37 PM By: Lenda Kelp PA-C Entered By: Lenda Kelp on 02/16/2020 13:08:37 -------------------------------------------------------------------------------- HxROS Details Patient Name: Date of Service: [name], [name] DA 02/16/2020 9:00 A M Medical Record Number: [id number] Patient Account Number: [id number] Date of Birth/Sex: Treating RN: 1960-05-17 (59 y.o. [name] Primary Care Provider: Theodis Shove Other Clinician: Referring Provider: Treating Provider/Extender: [name] in Treatment: 0 Information Obtained From Patient Constitutional Symptoms (General Health) Complaints and Symptoms: Negative for: Fatigue; Fever; Chills; Marked Weight Change Eyes Complaints and Symptoms: Positive for: Glasses / Contacts - glasses Negative for: Dry Eyes; Vision Changes Medical History: Negative for: Cataracts; Glaucoma; Optic Neuritis Ear/Nose/Mouth/Throat Complaints and Symptoms: Negative for: Chronic sinus problems or rhinitis Medical History: Negative for: Chronic sinus problems/congestion; Middle ear problems Respiratory  Complaints and Symptoms: Negative for: Chronic or frequent coughs; Shortness of Breath Medical History: Positive for: Sleep Apnea - CPAP Negative for: Aspiration; Asthma; Chronic Obstructive Pulmonary  Disease (COPD); Pneumothorax; Tuberculosis Cardiovascular Complaints and Symptoms: Negative for: Chest pain Medical History: Positive for: Hypertension; Peripheral Venous Disease Negative for: Angina; Arrhythmia; Congestive Heart Failure; Coronary Artery Disease; Deep Vein Thrombosis; Hypotension; Myocardial Infarction; Peripheral Arterial Disease; Phlebitis; Vasculitis Gastrointestinal Complaints and Symptoms: Negative for: Frequent diarrhea; Nausea; Vomiting Medical History: Negative for: Cirrhosis ; Colitis; Crohns; Hepatitis A; Hepatitis B; Hepatitis C Endocrine Complaints and Symptoms: Negative for: Heat/cold intolerance Medical History: Negative for: Type I Diabetes; Type II Diabetes Genitourinary Complaints and Symptoms: Negative for: Frequent urination Medical History: Negative for: End Stage Renal Disease Past Medical History Notes: years ago kidney stones Integumentary (Skin) Complaints and Symptoms: Positive for: Wounds - left leg Medical History: Negative for: History of Burn Musculoskeletal Complaints and Symptoms: Negative for: Muscle Pain; Muscle Weakness Medical History: Negative for: Gout; Rheumatoid Arthritis; Osteoarthritis; Osteomyelitis Neurologic Complaints and Symptoms: Negative for: Numbness/parasthesias Medical History: Negative for: Dementia; Neuropathy; Quadriplegia; Paraplegia; Seizure Disorder Psychiatric Complaints and Symptoms: Negative for: Claustrophobia; Suicidal Medical History: Negative for: Anorexia/bulimia; Confinement Anxiety Hematologic/Lymphatic Medical History: Positive for: Anemia - iron Negative for: Hemophilia; Human Immunodeficiency Virus; Lymphedema; Sickle Cell Disease Immunological Medical History: Negative for: Lupus Erythematosus; Raynauds; Scleroderma Oncologic Medical History: Negative for: Received Chemotherapy; Received Radiation Past Medical History Notes: skin Ca removed from back years  ago Immunizations Pneumococcal Vaccine: Received Pneumococcal Vaccination: No Implantable Devices None Hospitalization / Surgery History Type of Hospitalization/Surgery cholecystectomy 1980s nephrolithasis 1980s plate and rod in right elbow surgery 2010 Family and Social History Cancer: Yes - Mother; Diabetes: Yes - Father; Heart Disease: No; Hypertension: Yes - Mother; Kidney Disease: Yes - Mother; Lung Disease: Yes - Father; Seizures: No; Stroke: No; Thyroid Problems: Yes - Paternal Grandparents; Tuberculosis: No; Never smoker; Marital Status - Married; Alcohol Use: Never; Drug Use: No History; Caffeine Use: Daily - coffee; Financial Concerns: No; Food, Clothing or Shelter Needs: No; Support System Lacking: No; Transportation Concerns: No Electronic Signature(s) Signed: 02/16/2020 5:47:40 PM By: Lenda Kelp PA-C Signed: 02/16/2020 5:59:08 PM By: Shawn Stall Entered By: Shawn Stall on 02/16/2020 09:24:12 -------------------------------------------------------------------------------- SuperBill Details Patient Name: Date of Service: [name], [name] DA 02/16/2020 Medical Record Number: [id number] Patient Account Number: [id number] Date of Birth/Sex: Treating RN: [birth year] (59 y.o. [name] Primary Care Provider: Theodis Shove Other Clinician: Referring Provider: Treating Provider/Extender: [name] in Treatment: 0 Diagnosis Coding ICD-10 Codes Code Description I87.2 Venous insufficiency (chronic) (peripheral) L97.822 Non-pressure chronic ulcer of other part of left lower leg with fat layer exposed I10 Essential (primary) hypertension Facility Procedures CPT4 Code: 40981191 [id number] Description: 99213 - WOUND CARE VISIT-LEV 3 EST [place] 11042 - DEB SUBQ TISSUE 20 SQ CM/< ICD-10 Diagnosis Description L97.822 Non-pressure chronic ulcer of other part of left lower leg with fat layer e Modifier: 25 1 xposed Quantity: 1 Physician  Procedures : CPT4 Code Description Modifier [id number] WC PHYS LEVEL 3 NEW PT 25 ICD-10 Diagnosis Description I87.2 Venous insufficiency (chronic) (peripheral) L97.822 Non-pressure chronic ulcer of other part of left lower leg with fat layer exposed I10 Essential  (primary) hypertension Quantity: 1 : [id number] 11042 - WC PHYS SUBQ TISS 20 SQ CM ICD-10 Diagnosis Description L97.822 Non-pressure chronic ulcer of other part of left lower leg with fat layer exposed Quantity: 1 Electronic Signature(s) Signed: 02/16/2020 1:08:50 PM By: Lenda Kelp PA-C Entered By:  Lenda Kelp on 02/16/2020 13:08:50

## 2020-02-23 ENCOUNTER — Encounter (HOSPITAL_BASED_OUTPATIENT_CLINIC_OR_DEPARTMENT_OTHER): Payer: BC Managed Care – PPO | Admitting: Physician Assistant

## 2020-02-23 ENCOUNTER — Other Ambulatory Visit: Payer: Self-pay

## 2020-02-23 DIAGNOSIS — L97822 Non-pressure chronic ulcer of other part of left lower leg with fat layer exposed: Secondary | ICD-10-CM | POA: Diagnosis not present

## 2020-02-23 NOTE — Progress Notes (Addendum)
[name], [name] ([id number]) Visit Report for 02/23/2020 Chief Complaint Document Details Patient Name: Date of Service: [name], [name] 02/23/2020 9:15 A M Medical Record Number: [id number] Patient Account Number: [id number] Date of Birth/Sex: Treating RN: [name] (59 y.o. [name]) Boehlein, [name] Primary Care Provider: Theodis ShoveKoberlein, Junell Other Clinician: Referring Provider: Treating Provider/Extender: Esaw DaceStone III, Deisy Ozbun Koberlein, Junell Weeks in Treatment: 1 Information Obtained from: Patient Chief Complaint Left LE Ulcer Electronic Signature(s) Signed: 02/23/2020 9:42:31 AM By: Lenda KelpStone III, Heavenly Christine PA-C Entered By: Lenda KelpStone III, Letia Guidry on 02/23/2020 09:42:31 -------------------------------------------------------------------------------- Debridement Details Patient Name: Date of Service: [name], [name] 02/23/2020 9:15 A M Medical Record Number: [id number] Patient Account Number: [id number] Date of Birth/Sex: Treating RN: [name] (59 y.o. [name]) Boehlein, [name] Primary Care Provider: Theodis ShoveKoberlein, Junell Other Clinician: Referring Provider: Treating Provider/Extender: Esaw DaceStone III, Danis Pemble[place] Koberlein, Junell Weeks in Treatment: 1 Debridement Performed for Assessment: Wound #1 Left,Medial Lower Leg Performed By: Physician Lenda KelpStone III, Slate Debroux, PA Debridement Type: Debridement Severity of Tissue Pre Debridement: Fat layer exposed Level of Consciousness (Pre-procedure): Awake and Alert Pre-procedure Verification/Time Out Yes - 10:30 Taken: Start Time: 10:33 Pain Control: Lidocaine 5% topical ointment T Area Debrided (L x W): otal 3.4 (cm) x 1 (cm) = 3.4 (cm) Tissue and other material debrided: Viable, Non-Viable, Slough, Subcutaneous, Slough Level: Skin/Subcutaneous Tissue Debridement Description: Excisional Instrument: Curette Bleeding: Minimum Hemostasis Achieved: Pressure End Time: 10:36 Procedural Pain: 3 Post Procedural Pain: 2 Response to Treatment: Procedure was tolerated well Level of  Consciousness (Post- Awake and Alert procedure): Post Debridement Measurements of Total Wound Length: (cm) 3.4 Width: (cm) 1 Depth: (cm) 0.3 Volume: (cm) 0.801 Character of Wound/Ulcer Post Debridement: Requires Further Debridement Severity of Tissue Post Debridement: Fat layer exposed Post Procedure Diagnosis Same as Pre-procedure Electronic Signature(s) Signed: 02/23/2020 5:04:51 PM By: Lenda KelpStone III, Arnesha Schiraldi PA-C Signed: 02/24/2020 2:58:18 PM By: Zenaida DeedBoehlein, [name] RN, BSN Entered By: Zenaida DeedBoehlein, [name] on 02/23/2020 10:37:30 -------------------------------------------------------------------------------- HPI Details Patient Name: Date of Service: [name], [name] 02/23/2020 9:15 A M Medical Record Number: [id number] Patient Account Number: [id number] Date of Birth/Sex: Treating RN: [name] (59 y.o. [name]) Boehlein, [name] Primary Care Provider: Theodis ShoveKoberlein, Junell Other Clinician: Referring Provider: Treating Provider/Extender: Esaw DaceStone III, Tephanie Escorcia Koberlein, Junell Weeks in Treatment: 1 History of Present Illness HPI Description: 02/16/2020 upon evaluation today patient actually appears to be doing poorly in regard to her left medial lower extremity ulcer. This is actually an area that she tells me she has had intermittent issues with over the years although has been closed for some time she typically uses compression right now she has juxta lite compression wraps. With that being said she tells me that this nonetheless open several weeks/months ago and has been given her trouble since. She does have a history of chronic venous insufficiency she is seeing specialist for this in the past she has had an ablation as well as sclerotherapy. With that being said she also has hypertension chronically which is managed by her primary care provider. In general she seems to be worsening overall with regard to the wound and states that she finally realized that she needed to come in and have somebody look at  this and not continue to try to manage this on her own. No fevers, chills, nausea, vomiting, or diarrhea. 02/23/2020 on evaluation today patient appears to be doing well with regard to her wound. This is showing some signs of improvement which is great news still were not quite at the point where I would like to be as  far as the overall appearance of the wound is concerned but I do believe this is better than last week. I do believe the Iodoflex is helping as well. Electronic Signature(s) Signed: 02/23/2020 10:39:02 AM By: Lenda Kelp PA-C Entered By: Lenda Kelp on 02/23/2020 10:39:02 -------------------------------------------------------------------------------- Physical Exam Details Patient Name: Date of Service: [name] 02/23/2020 9:15 A M Medical Record Number: [id number] Patient Account Number: [id number] Date of Birth/Sex: Treating RN: [birth year] (59 y.o. [name] Standard Primary Care Provider: Theodis Shove Other Clinician: Referring Provider: Treating Provider/Extender: [name] in Treatment: 1 Constitutional Well-nourished and well-hydrated in no acute distress. Respiratory normal breathing without difficulty. Psychiatric this patient is able to make decisions and demonstrates good insight into disease process. Alert and Oriented x 3. pleasant and cooperative. Notes Patient's wound bed actually showed signs of better granulation though she still has some somewhat poor granulation tissue I think that were working her way to a better overall surface and in general I think she is doing quite well today. Electronic Signature(s) Signed: 02/23/2020 10:39:18 AM By: Lenda Kelp PA-C Entered By: Lenda Kelp on 02/23/2020 10:39:17 -------------------------------------------------------------------------------- Physician Orders Details Patient Name: Date of Service: [name], [name] 02/23/2020 9:15 A M Medical Record  Number: [id number] Patient Account Number: [id number] Date of Birth/Sex: Treating RN: 1961/01/28 (59 y.o. [name] Standard Primary Care Provider: Theodis Shove Other Clinician: Referring Provider: Treating Provider/Extender: [name] in Treatment: 1 Verbal / Phone Orders: No Diagnosis Coding ICD-10 Coding Code Description I87.2 Venous insufficiency (chronic) (peripheral) L97.822 Non-pressure chronic ulcer of other part of left lower leg with fat layer exposed I10 Essential (primary) hypertension Follow-up Appointments ppointment in 2 weeks. - 12/3 with Leonard Schwartz Return A Nurse Visit: - Tues 11/23 Dressing Change Frequency Do not change entire dressing for one week. Skin Barriers/Peri-Wound Care Moisturizing lotion - to leg Other: - mupirocin cream to left thigh nodule 2 times per day Wound Cleansing May shower with protection. Primary Wound Dressing Wound #1 Left,Medial Lower Leg Iodoflex Secondary Dressing Wound #1 Left,Medial Lower Leg Dry Gauze Edema Control 3 Layer Compression System - Left Lower Extremity - unna layer at top to secure Avoid standing for long periods of time Elevate legs to the level of the heart or above for 30 minutes daily and/or when sitting, a frequency of: - throughout the day Exercise regularly Support Garment 20-30 mm/Hg pressure to: - circaid juxtalite right leg daily Electronic Signature(s) Signed: 02/23/2020 5:04:51 PM By: Lenda Kelp PA-C Signed: 02/24/2020 2:58:18 PM By: [name] RN, BSN Entered By: [name] on 02/23/2020 10:38:06 -------------------------------------------------------------------------------- Problem List Details Patient Name: Date of Service: [name], [name] 02/23/2020 9:15 A M Medical Record Number: [id number] Patient Account Number: [id number] Date of Birth/Sex: Treating RN: 08/07/1960 (59 y.o. [name] Standard Primary Care Provider: Theodis Shove Other  Clinician: Referring Provider: Treating Provider/Extender: [name] in Treatment: 1 Active Problems ICD-10 Encounter Code Description Active Date MDM Diagnosis I87.2 Venous insufficiency (chronic) (peripheral) 02/16/2020 No Yes L97.822 Non-pressure chronic ulcer of other part of left lower leg with fat layer 02/16/2020 No Yes exposed I10 Essential (primary) hypertension 02/16/2020 No Yes Inactive Problems Resolved Problems Electronic Signature(s) Signed: 02/23/2020 9:42:26 AM By: Lenda Kelp PA-C Entered By: Lenda Kelp on 02/23/2020 09:42:25 -------------------------------------------------------------------------------- Progress Note Details Patient Name: Date of Service: [name], [name] 02/23/2020 9:15 A M Medical Record Number: [id number] Patient Account  Number: [id number] Date of Birth/Sex: Treating RN: [birth year] (59 y.o. [name] Standard Primary Care Provider: Theodis Shove Other Clinician: Referring Provider: Treating Provider/Extender: [name] in Treatment: 1 Subjective Chief Complaint Information obtained from Patient Left LE Ulcer History of Present Illness (HPI) 02/16/2020 upon evaluation today patient actually appears to be doing poorly in regard to her left medial lower extremity ulcer. This is actually an area that she tells me she has had intermittent issues with over the years although has been closed for some time she typically uses compression right now she has juxta lite compression wraps. With that being said she tells me that this nonetheless open several weeks/months ago and has been given her trouble since. She does have a history of chronic venous insufficiency she is seeing specialist for this in the past she has had an ablation as well as sclerotherapy. With that being said she also has hypertension chronically which is managed by her primary care provider. In general she  seems to be worsening overall with regard to the wound and states that she finally realized that she needed to come in and have somebody look at this and not continue to try to manage this on her own. No fevers, chills, nausea, vomiting, or diarrhea. 02/23/2020 on evaluation today patient appears to be doing well with regard to her wound. This is showing some signs of improvement which is great news still were not quite at the point where I would like to be as far as the overall appearance of the wound is concerned but I do believe this is better than last week. I do believe the Iodoflex is helping as well. Objective Constitutional Well-nourished and well-hydrated in no acute distress. Vitals Time Taken: 9:36 AM, Height: 64 in, Weight: 278 lbs, BMI: 47.7, Temperature: 97.9 F, Pulse: 75 bpm, Respiratory Rate: 20 breaths/min, Blood Pressure: 144/86 mmHg. Respiratory normal breathing without difficulty. Psychiatric this patient is able to make decisions and demonstrates good insight into disease process. Alert and Oriented x 3. pleasant and cooperative. General Notes: Patient's wound bed actually showed signs of better granulation though she still has some somewhat poor granulation tissue I think that were working her way to a better overall surface and in general I think she is doing quite well today. Integumentary (Hair, Skin) Wound #1 status is Open. Original cause of wound was Gradually Appeared. The wound is located on the Left,Medial Lower Leg. The wound measures 3.4cm length x 1cm width x 0.3cm depth; 2.67cm^2 area and 0.801cm^3 volume. There is Fat Layer (Subcutaneous Tissue) exposed. There is no tunneling or undermining noted. There is a medium amount of purulent drainage noted. The wound margin is distinct with the outline attached to the wound base. There is small (1-33%) pink, pale granulation within the wound bed. There is a large (67-100%) amount of necrotic tissue within the wound  bed including Adherent Slough. Assessment Active Problems ICD-10 Venous insufficiency (chronic) (peripheral) Non-pressure chronic ulcer of other part of left lower leg with fat layer exposed Essential (primary) hypertension Procedures Wound #1 Pre-procedure diagnosis of Wound #1 is a Venous Leg Ulcer located on the Left,Medial Lower Leg .Severity of Tissue Pre Debridement is: Fat layer exposed. There was a Excisional Skin/Subcutaneous Tissue Debridement with a total area of 3.4 sq cm performed by Lenda Kelp, PA. With the following instrument(s): Curette to remove Viable and Non-Viable tissue/material. Material removed includes Subcutaneous Tissue and Slough and after achieving pain control using Lidocaine 5%  topical ointment. No specimens were taken. A time out was conducted at 10:30, prior to the start of the procedure. A Minimum amount of bleeding was controlled with Pressure. The procedure was tolerated well with a pain level of 3 throughout and a pain level of 2 following the procedure. Post Debridement Measurements: 3.4cm length x 1cm width x 0.3cm depth; 0.801cm^3 volume. Character of Wound/Ulcer Post Debridement requires further debridement. Severity of Tissue Post Debridement is: Fat layer exposed. Post procedure Diagnosis Wound #1: Same as Pre-Procedure Pre-procedure diagnosis of Wound #1 is a Venous Leg Ulcer located on the Left,Medial Lower Leg . There was a Three Layer Compression Therapy Procedure by Yevonne Pax, RN. Post procedure Diagnosis Wound #1: Same as Pre-Procedure Plan Follow-up Appointments: Return Appointment in 2 weeks. - 12/3 with Leonard Schwartz Nurse Visit: - Tues 11/23 Dressing Change Frequency: Do not change entire dressing for one week. Skin Barriers/Peri-Wound Care: Moisturizing lotion - to leg Other: - mupirocin cream to left thigh nodule 2 times per day Wound Cleansing: May shower with protection. Primary Wound Dressing: Wound #1 Left,Medial Lower  Leg: Iodoflex Secondary Dressing: Wound #1 Left,Medial Lower Leg: Dry Gauze Edema Control: 3 Layer Compression System - Left Lower Extremity - unna layer at top to secure Avoid standing for long periods of time Elevate legs to the level of the heart or above for 30 minutes daily and/or when sitting, a frequency of: - throughout the day Exercise regularly Support Garment 20-30 mm/Hg pressure to: - circaid juxtalite right leg daily 1. Would recommend at this time that we going to continue with the Iodoflex I feel like that is the best option for the patient currently. 2. I am also can recommend at this time that the patient continue to monitor for any signs of worsening infection obviously right now I do not see anything that is a problem and subsequently I think that also she would obviously notice increased pain if she were to develop anything from an infection standpoint. Nonetheless we will keep an eye on things in that regard. 3. I am also can recommend that we continue to 3 layer compression wrap for the time being. We will see patient back for reevaluation in 1 week here in the clinic. If anything worsens or changes patient will contact our office for additional recommendations. Electronic Signature(s) Signed: 02/23/2020 10:39:55 AM By: Lenda Kelp PA-C Entered By: Lenda Kelp on 02/23/2020 10:39:55 -------------------------------------------------------------------------------- SuperBill Details Patient Name: Date of Service: [name], [name] 02/23/2020 Medical Record Number: [id number] Patient Account Number: [id number] Date of Birth/Sex: Treating RN: March 06, 1961 (59 y.o. [name] Standard Primary Care Provider: Theodis Shove Other Clinician: Referring Provider: Treating Provider/Extender: [name] in Treatment: 1 Diagnosis Coding ICD-10 Codes Code Description I87.2 Venous insufficiency (chronic) (peripheral) L97.822 Non-pressure  chronic ulcer of other part of left lower leg with fat layer exposed I10 Essential (primary) hypertension Facility Procedures CPT4 Code: [id number] Description: 11042 - DEB SUBQ TISSUE 20 SQ CM/< ICD-10 Diagnosis Description L97.822 Non-pressure chronic ulcer of other part of left lower leg with fat layer expos Modifier: ed Quantity: 1 Physician Procedures : CPT4 Code Description Modifier [id number] 11042 - WC PHYS SUBQ TISS 20 SQ CM ICD-10 Diagnosis Description L97.822 Non-pressure chronic ulcer of other part of left lower leg with fat layer exposed Quantity: 1 Electronic Signature(s) Signed: 02/23/2020 10:40:03 AM By: Lenda Kelp PA-C Entered By: Lenda Kelp on 02/23/2020 10:40:02

## 2020-02-24 NOTE — Progress Notes (Signed)
CLOYCE, PATERSON ([id number]) Visit Report for 02/23/2020 Arrival Information Details Patient Name: Date of Service: [name] 02/23/2020 9:15 A M Medical Record Number: [id number] Patient Account Number: [id number] Date of Birth/Sex: Treating RN: [birth year] (59 y.o. [name], Millard.Loa Primary Care [name]: Theodis Shove Other Clinician: Referring [name]: Treating Bellamie Turney/Extender: [name] in Treatment: 1 Visit Information History Since Last Visit Added or deleted any medications: No Patient Arrived: Ambulatory Any new allergies or adverse reactions: No Arrival Time: 09:35 Had a fall or experienced change in No Accompanied By: self activities of daily living that may affect Transfer Assistance: None risk of falls: Patient Identification Verified: Yes Signs or symptoms of abuse/neglect since last visito No Secondary Verification Process Completed: Yes Hospitalized since last visit: No Patient Requires Transmission-Based Precautions: No Implantable device outside of the clinic excluding No Patient Has Alerts: Yes cellular tissue based products placed in the center Patient Alerts: ABI noncompressible since last visit: Has Dressing in Place as Prescribed: Yes Has Compression in Place as Prescribed: Yes Pain Present Now: No Electronic Signature(s) Signed: 02/23/2020 5:29:55 PM By: Shawn Stall Entered By: Shawn Stall on 02/23/2020 09:41:45 -------------------------------------------------------------------------------- Compression Therapy Details Patient Name: Date of Service: [name] [name] 02/23/2020 9:15 A M Medical Record Number: [id number] Patient Account Number: [id number] Date of Birth/Sex: Treating RN: [birth year] (59 y.o. [name] [name] Primary Care [name]: Theodis Shove Other Clinician: Referring [name]: Treating Jesicca Dipierro/Extender: [name] in Treatment: 1 Compression Therapy  Performed for Wound Assessment: Wound #1 Left,Medial Lower Leg Performed By: Clinician Yevonne Pax, RN Compression Type: Three Layer Post Procedure Diagnosis Same as Pre-procedure Electronic Signature(s) Signed: 02/24/2020 2:58:18 PM By: [name] RN, BSN Entered By: [name] on 02/23/2020 10:35:49 -------------------------------------------------------------------------------- Encounter Discharge Information Details Patient Name: Date of Service: [name], [name] 02/23/2020 9:15 A M Medical Record Number: [id number] Patient Account Number: [id number] Date of Birth/Sex: Treating RN: [name] (59 y.o. [name] Primary Care Lylith Bebeau: Theodis Shove Other Clinician: Referring Bethel Gaglio: Treating Carlyann Placide/Extender: [name] in Treatment: 1 Encounter Discharge Information Items Post Procedure Vitals Discharge Condition: Stable Temperature (F): 97.9 Ambulatory Status: Ambulatory Pulse (bpm): 75 Discharge Destination: Home Respiratory Rate (breaths/min): 20 Transportation: Private Auto Blood Pressure (mmHg): 144/86 Accompanied By: self Schedule Follow-up Appointment: Yes Clinical Summary of Care: Patient Declined Electronic Signature(s) Signed: 02/23/2020 5:02:10 PM By: Yevonne Pax RN Entered By: Yevonne Pax on 02/23/2020 10:55:10 -------------------------------------------------------------------------------- Lower Extremity Assessment Details Patient Name: Date of Service: [name] [name] 02/23/2020 9:15 A M Medical Record Number: [id number] Patient Account Number: [id number] Date of Birth/Sex: Treating RN: [name] (59 y.o. [name] Primary Care Mekhi Sonn: Theodis Shove Other Clinician: Referring Viviana Trimble: Treating Jasten Guyette/Extender: [name] in Treatment: 1 Edema Assessment Assessed: [Left: Yes] [Right: No] Edema: [Left: Ye] [Right: s] Calf Left: Right: Point of  Measurement: 35 cm From Medial Instep 45 cm Ankle Left: Right: Point of Measurement: 9 cm From Medial Instep 22.5 cm Vascular Assessment Pulses: Dorsalis Pedis Palpable: [Left:Yes] Electronic Signature(s) Signed: 02/23/2020 5:29:55 PM By: Shawn Stall Entered By: Shawn Stall on 02/23/2020 09:42:16 -------------------------------------------------------------------------------- Multi-Disciplinary Care Plan Details Patient Name: Date of Service: [name], [name] 02/23/2020 9:15 A M Medical Record Number: [id number] Patient Account Number: [id number] Date of Birth/Sex: Treating RN: [name] (59 y.o. [name] [name] Primary Care Tullio Chausse: Theodis Shove Other Clinician: Referring Lee Kuang: Treating Jozef Eisenbeis/Extender: [name] in Treatment: 1 Active Inactive  Venous Leg Ulcer Nursing Diagnoses: Knowledge deficit related to disease process and management Potential for venous Insuffiency (use before diagnosis confirmed) Goals: Patient will maintain optimal edema control Date Initiated: 02/16/2020 Target Resolution Date: 03/16/2020 Goal Status: Active Interventions: Assess peripheral edema status every visit. Compression as ordered Provide education on venous insufficiency Treatment Activities: Therapeutic compression applied : 02/16/2020 Notes: Wound/Skin Impairment Nursing Diagnoses: Impaired tissue integrity Knowledge deficit related to ulceration/compromised skin integrity Goals: Patient/caregiver will verbalize understanding of skin care regimen Date Initiated: 02/16/2020 Target Resolution Date: 03/16/2020 Goal Status: Active Ulcer/skin breakdown will have a volume reduction of 30% by week 4 Date Initiated: 02/16/2020 Target Resolution Date: 03/15/2020 Goal Status: Active Interventions: Assess patient/caregiver ability to obtain necessary supplies Assess patient/caregiver ability to perform ulcer/skin care regimen upon  admission and as needed Assess ulceration(s) every visit Provide education on ulcer and skin care Treatment Activities: Skin care regimen initiated : 02/16/2020 Topical wound management initiated : 02/16/2020 Notes: Electronic Signature(s) Signed: 02/24/2020 2:58:18 PM By: [name] RN, BSN Entered By: [name] on 02/23/2020 10:29:04 -------------------------------------------------------------------------------- Pain Assessment Details Patient Name: Date of Service: [name], [name] 02/23/2020 9:15 A M Medical Record Number: [id number] Patient Account Number: [id number] Date of Birth/Sex: Treating RN: [birth year] (59 y.o. [name] Primary Care Efraim Vanallen: Theodis Shove Other Clinician: Referring Coburn Knaus: Treating Hollace Michelli/Extender: [name] in Treatment: 1 Active Problems Location of Pain Severity and Description of Pain Patient Has Paino No Site Locations Rate the pain. Current Pain Level: 0 Pain Management and Medication Current Pain Management: Medication: No Cold Application: No Rest: No Massage: No Activity: No T.E.N.S.: No Heat Application: No Leg drop or elevation: No Is the Current Pain Management Adequate: Adequate How does your wound impact your activities of daily livingo Sleep: No Bathing: No Appetite: No Relationship With Others: No Bladder Continence: No Emotions: No Bowel Continence: No Work: No Toileting: No Drive: No Dressing: No Hobbies: No Electronic Signature(s) Signed: 02/23/2020 5:29:55 PM By: Shawn Stall Entered By: Shawn Stall on 02/23/2020 09:42:05 -------------------------------------------------------------------------------- Patient/Caregiver Education Details Patient Name: Date of Service: [name], [name] 11/17/2021andnbsp9:15 A M Medical Record Number: [id number] Patient Account Number: [id number] Date of Birth/Gender: Treating RN: [birth year] (59 y.o. [name] [name] Primary Care Physician: Theodis Shove Other Clinician: Referring Physician: Treating Physician/Extender: Eliseo Squires in Treatment: 1 Education Assessment Education Provided To: Patient Education Topics Provided Venous: Methods: Explain/Verbal Responses: Reinforcements needed, State content correctly Wound/Skin Impairment: Methods: Explain/Verbal Responses: Reinforcements needed, State content correctly Electronic Signature(s) Signed: 02/24/2020 2:58:18 PM By: [name] RN, BSN Entered By: [name] on 02/23/2020 10:31:04 -------------------------------------------------------------------------------- Wound Assessment Details Patient Name: Date of Service: [name], [name] 02/23/2020 9:15 A M Medical Record Number: [id number] Patient Account Number: [id number] Date of Birth/Sex: Treating RN: [name] (59 y.o. [name], Millard.Loa Primary Care Katharin Schneider: Theodis Shove Other Clinician: Referring Eilis Chestnutt: Treating Ulis Kaps/Extender: [name] in Treatment: 1 Wound Status Wound Number: 1 Primary Venous Leg Ulcer Etiology: Wound Location: Left, Medial Lower Leg Wound Status: Open Wounding Event: Gradually Appeared Comorbid Anemia, Sleep Apnea, Hypertension, Peripheral Venous Date Acquired: 01/26/2020 History: Disease Weeks Of Treatment: 1 Clustered Wound: No Photos Photo Uploaded By: Benjaman Kindler on 02/24/2020 13:20:40 Wound Measurements Length: (cm) 3.4 Width: (cm) 1 Depth: (cm) 0.3 Area: (cm) 2.67 Volume: (cm) 0.801 % Reduction in Area: 20.7% % Reduction in Volume: 20.8% Epithelialization: None Tunneling: No Undermining: No Wound Description Classification: Full Thickness Without Exposed Support  Structures Wound Margin: Distinct, outline attached Exudate Amount: Medium Exudate Type: Purulent Exudate Color: yellow, brown, green Wound Bed Granulation Amount: Small  (1-33%) Granulation Quality: Pink, Pale Necrotic Amount: Large (67-100%) Necrotic Quality: Adherent Slough Foul Odor After Cleansing: No Slough/Fibrino Yes Exposed Structure Fascia Exposed: No Fat Layer (Subcutaneous Tissue) Exposed: Yes Tendon Exposed: No Muscle Exposed: No Joint Exposed: No Bone Exposed: No Treatment Notes Wound #1 (Left, Medial Lower Leg) 1. Cleanse With Wound Cleanser Soap and water 3. Primary Dressing Applied Iodoflex 4. Secondary Dressing Dry Gauze 6. Support Layer Applied 3 layer compression wrap Notes netting , mupricion to thigh area Electronic Signature(s) Signed: 02/23/2020 5:29:55 PM By: Shawn Stall Entered By: Shawn Stall on 02/23/2020 09:42:50 -------------------------------------------------------------------------------- Vitals Details Patient Name: Date of Service: [name], [name] 02/23/2020 9:15 A M Medical Record Number: [id number] Patient Account Number: [id number] Date of Birth/Sex: Treating RN: [name] (59 y.o. [name], Millard.Loa Primary Care Lula Kolton: Theodis Shove Other Clinician: Referring Labarron Durnin: Treating Kashlyn Salinas/Extender: [name] in Treatment: 1 Vital Signs Time Taken: 09:36 Temperature (F): 97.9 Height (in): 64 Pulse (bpm): 75 Weight (lbs): 278 Respiratory Rate (breaths/min): 20 Body Mass Index (BMI): 47.7 Blood Pressure (mmHg): 144/86 Reference Range: 80 - 120 mg / dl Electronic Signature(s) Signed: 02/23/2020 5:29:55 PM By: Shawn Stall Entered By: Shawn Stall on 02/23/2020 09:41:57

## 2020-02-27 ENCOUNTER — Other Ambulatory Visit: Payer: Self-pay | Admitting: Family Medicine

## 2020-02-27 DIAGNOSIS — I10 Essential (primary) hypertension: Secondary | ICD-10-CM

## 2020-02-29 ENCOUNTER — Encounter (HOSPITAL_BASED_OUTPATIENT_CLINIC_OR_DEPARTMENT_OTHER): Payer: BC Managed Care – PPO | Admitting: Internal Medicine

## 2020-02-29 ENCOUNTER — Other Ambulatory Visit: Payer: Self-pay

## 2020-02-29 DIAGNOSIS — L97822 Non-pressure chronic ulcer of other part of left lower leg with fat layer exposed: Secondary | ICD-10-CM | POA: Diagnosis not present

## 2020-02-29 NOTE — Progress Notes (Signed)
[name], [name] ([id number]) Visit Report for 02/29/2020 Arrival Information Details Patient Name: Date of Service: [name] 02/29/2020 8:15 A M Medical Record Number: [id number] Patient Account Number: [id number] Date of Birth/Sex: Treating RN: [name] (59 y.o. [name] Primary Care Vernia Teem: Theodis Shove Other Clinician: Referring Marry Kusch: Treating Delila Kuklinski/Extender: Virgina [place] in Treatment: 1 Visit Information History Since Last Visit Added or deleted any medications: No Patient Arrived: Ambulatory Any new allergies or adverse reactions: No Arrival Time: 08:29 Had a fall or experienced change in No Accompanied By: self activities of daily living that may affect Transfer Assistance: None risk of falls: Patient Identification Verified: Yes Signs or symptoms of abuse/neglect since last visito No Secondary Verification Process Completed: Yes Hospitalized since last visit: No Patient Requires Transmission-Based Precautions: No Implantable device outside of the clinic excluding No Patient Has Alerts: Yes cellular tissue based products placed in the center Patient Alerts: ABI noncompressible since last visit: Has Dressing in Place as Prescribed: Yes Has Compression in Place as Prescribed: Yes Pain Present Now: No Electronic Signature(s) Signed: 02/29/2020 2:07:57 PM By: [name] RN, BSN Entered By: [name] on 02/29/2020 08:29:43 -------------------------------------------------------------------------------- Compression Therapy Details Patient Name: Date of Service: [name], [name] 02/29/2020 8:15 A M Medical Record Number: [id number] Patient Account Number: [id number] Date of Birth/Sex: Treating RN: [name] (59 y.o. [name] Primary Care Kelby Lotspeich: Theodis Shove Other Clinician: Referring Shavell Nored: Treating Zakariya Knickerbocker/Extender: Virgina [place] in Treatment:  1 Compression Therapy Performed for Wound Assessment: Wound #1 Left,Medial Lower Leg Performed By: Clinician [name], RN Compression Type: Three Emergency planning/management officer) Signed: 02/29/2020 2:07:57 PM By: [name] RN, BSN Entered By: [name] on 02/29/2020 08:31:02 -------------------------------------------------------------------------------- Encounter Discharge Information Details Patient Name: Date of Service: [name], [name] 02/29/2020 8:15 A M Medical Record Number: [id number] Patient Account Number: [id number] Date of Birth/Sex: Treating RN: [name] (59 y.o. [name] Primary Care Tamea Bai: Theodis Shove Other Clinician: Referring Kilie Rund: Treating Darcel Frane/Extender: Virgina [place] in Treatment: 1 Encounter Discharge Information Items Discharge Condition: Stable Ambulatory Status: Ambulatory Discharge Destination: Home Transportation: Private Auto Accompanied By: self Schedule Follow-up Appointment: Yes Clinical Summary of Care: Patient Declined Electronic Signature(s) Signed: 02/29/2020 2:07:57 PM By: [name] RN, BSN Entered By: [name] on 02/29/2020 08:32:10 -------------------------------------------------------------------------------- Patient/Caregiver Education Details Patient Name: Date of Service: [name], [name] 11/23/2021andnbsp8:15 A M Medical Record Number: [id number] Patient Account Number: [id number] Date of Birth/Gender: Treating RN: [birth year]-[name] (59 y.o. [name] Primary Care Physician: Theodis Shove Other Clinician: Referring Physician: Treating Physician/Extender: Virgina [place] in Treatment: 1 Education Assessment Education Provided To: Patient Education Topics Provided Venous: Methods: Explain/Verbal Responses: Reinforcements needed, State content correctly Electronic Signature(s) Signed: 02/29/2020 2:07:57 PM By:  [name] RN, BSN Entered By: [name] on 02/29/2020 08:31:57 -------------------------------------------------------------------------------- Wound Assessment Details Patient Name: Date of Service: [name], [name] 02/29/2020 8:15 A M Medical Record Number: [id number] Patient Account Number: [id number] Date of Birth/Sex: Treating RN: [birth year] (59 y.o. [name], [name] Primary Care Kyerra Vargo: Theodis Shove Other Clinician: Referring Yuka Lallier: Treating Esti Demello/Extender: Virgina [place] in Treatment: 1 Wound Status Wound Number: 1 Primary Venous Leg Ulcer Etiology: Wound Location: Left, Medial Lower Leg Wound Status: Open Wounding Event: Gradually Appeared Comorbid Anemia, Sleep Apnea, Hypertension, Peripheral Venous Date Acquired: 01/26/2020 History: Disease Weeks Of Treatment: 1 Clustered Wound: No Wound Measurements Length: (cm) 3.4 Width: (cm) 1 Depth: (cm) 0.3 Area: (cm)  2.67 Volume: (cm) 0.801 % Reduction in Area: 20.7% % Reduction in Volume: 20.8% Epithelialization: None Tunneling: No Undermining: No Wound Description Classification: Full Thickness Without Exposed Support Structures Wound Margin: Distinct, outline attached Exudate Amount: Medium Exudate Type: Serosanguineous Exudate Color: red, brown Foul Odor After Cleansing: No Slough/Fibrino Yes Wound Bed Granulation Amount: Small (1-33%) Exposed Structure Granulation Quality: Pink, Pale Fascia Exposed: No Necrotic Amount: Large (67-100%) Fat Layer (Subcutaneous Tissue) Exposed: Yes Necrotic Quality: Adherent Slough Tendon Exposed: No Muscle Exposed: No Joint Exposed: No Bone Exposed: No Treatment Notes Wound #1 (Left, Medial Lower Leg) 2. Periwound Care Moisturizing lotion 3. Primary Dressing Applied Iodosorb 4. Secondary Dressing Dry Gauze 6. Support Layer Applied 3 layer compression wrap Notes netting , Electronic Signature(s) Signed:  02/29/2020 2:07:57 PM By: [name] RN, BSN Entered By: [name] on 02/29/2020 08:30:47 -------------------------------------------------------------------------------- Vitals Details Patient Name: Date of Service: [name], [name] 02/29/2020 8:15 A M Medical Record Number: [id number] Patient Account Number: [id number] Date of Birth/Sex: Treating RN: [name], [name] (59 y.o. [name] Primary Care Anushka Hartinger: Theodis Shove Other Clinician: Referring Ana Woodroof: Treating Brianny Soulliere/Extender: Virgina [place] in Treatment: 1 Vital Signs Time Taken: 08:15 Temperature (F): 98.2 Height (in): 64 Pulse (bpm): 88 Source: Stated Respiratory Rate (breaths/min): 18 Weight (lbs): 278 Blood Pressure (mmHg): 162/87 Source: Stated Reference Range: 80 - 120 mg / dl Body Mass Index (BMI): 47.7 Electronic Signature(s) Signed: 02/29/2020 2:07:57 PM By: [name] RN, BSN Entered By: [name] on 02/29/2020 08:30:19

## 2020-03-06 NOTE — Progress Notes (Signed)
MAKYNLIE, ROSSINI ([id number]) Visit Report for 02/29/2020 SuperBill Details Patient Name: Date of Service: [place] DA 02/29/2020 Medical Record Number: [id number] Patient Account Number: [id number] Date of Birth/Sex: Treating RN: 31-Dec-1960 (59 y.o. [name] Primary Care Provider: Theodis Shove Other Clinician: Referring Provider: Treating Provider/Extender: Virgina [place] in Treatment: 1 Diagnosis Coding ICD-10 Codes Code Description I87.2 Venous insufficiency (chronic) (peripheral) L97.822 Non-pressure chronic ulcer of other part of left lower leg with fat layer exposed I10 Essential (primary) hypertension Facility Procedures CPT4 Code Description Modifier Quantity [id number] (Facility Use Only) [id number] - APPLY MULTLAY COMPRS LWR LT LEG 1 Electronic Signature(s) Signed: 02/29/2020 2:07:57 PM By: [name] RN, BSN Signed: 03/06/2020 4:47:57 PM By: Baltazar Najjar MD Entered By: [name] on 02/29/2020 08:32:27

## 2020-03-08 ENCOUNTER — Encounter (HOSPITAL_BASED_OUTPATIENT_CLINIC_OR_DEPARTMENT_OTHER): Payer: BC Managed Care – PPO | Attending: Physician Assistant | Admitting: Physician Assistant

## 2020-03-08 ENCOUNTER — Other Ambulatory Visit: Payer: Self-pay

## 2020-03-08 DIAGNOSIS — L97822 Non-pressure chronic ulcer of other part of left lower leg with fat layer exposed: Secondary | ICD-10-CM | POA: Insufficient documentation

## 2020-03-08 DIAGNOSIS — I10 Essential (primary) hypertension: Secondary | ICD-10-CM | POA: Diagnosis not present

## 2020-03-08 DIAGNOSIS — I872 Venous insufficiency (chronic) (peripheral): Secondary | ICD-10-CM | POA: Insufficient documentation

## 2020-03-08 NOTE — Progress Notes (Signed)
[name], [name] ([id number]) Visit Report for 03/08/2020 Arrival Information Details Patient Name: Date of Service: [place] DA 03/08/2020 2:45 PM Medical Record Number: [id number] Patient Account Number: [id number] Date of Birth/Sex: Treating RN: Nov 22, 1960 (59 y.o. [name], [name] Primary Care Amberlin Utke: Theodis Shove Other Clinician: Referring Malkie Wille: Treating Jayse Hodkinson/Extender: [name] in Treatment: 3 Visit Information History Since Last Visit Added or deleted any medications: No Patient Arrived: Ambulatory Any new allergies or adverse reactions: No Arrival Time: 15:02 Had a fall or experienced change in No Accompanied By: self activities of daily living that may affect Transfer Assistance: None risk of falls: Patient Identification Verified: Yes Signs or symptoms of abuse/neglect since last visito No Secondary Verification Process Completed: Yes Hospitalized since last visit: No Patient Requires Transmission-Based Precautions: No Implantable device outside of the clinic excluding No Patient Has Alerts: Yes cellular tissue based products placed in the center Patient Alerts: ABI noncompressible since last visit: Has Dressing in Place as Prescribed: Yes Pain Present Now: No Electronic Signature(s) Signed: 03/08/2020 3:05:31 PM By: Karl Ito Entered By: Karl Ito on 03/08/2020 15:04:55 -------------------------------------------------------------------------------- Compression Therapy Details Patient Name: Date of Service: [name] DA 03/08/2020 2:45 PM Medical Record Number: [id number] Patient Account Number: [id number] Date of Birth/Sex: Treating RN: Feb 11, 1961 (59 y.o. [name] [name] Primary Care Chet Greenley: Theodis Shove Other Clinician: Referring Alphonza Tramell: Treating Lorraina Spring/Extender: [name] in Treatment: 3 Compression Therapy Performed for Wound Assessment: Wound #1  Left,Medial Lower Leg Performed By: Clinician Yevonne Pax, RN Compression Type: Three Layer Post Procedure Diagnosis Same as Pre-procedure Electronic Signature(s) Signed: 03/08/2020 5:21:24 PM By: [name] RN, BSN Entered By: [name] on 03/08/2020 15:37:10 -------------------------------------------------------------------------------- Encounter Discharge Information Details Patient Name: Date of Service: [name], [name] DA 03/08/2020 2:45 PM Medical Record Number: [id number] Patient Account Number: [id number] Date of Birth/Sex: Treating RN: 1960-11-13 (59 y.o. [name] Primary Care [name]: Theodis Shove Other Clinician: Referring [name]: Treating [name]/Extender: Eliseo Squires in Treatment: 3 Encounter Discharge Information Items Discharge Condition: Stable Ambulatory Status: Ambulatory Discharge Destination: Home Transportation: Private Auto Accompanied By: self Schedule Follow-up Appointment: Yes Clinical Summary of Care: Electronic Signature(s) Signed: 03/08/2020 5:34:45 PM By: Shawn Stall Entered By: Shawn Stall on 03/08/2020 16:07:28 -------------------------------------------------------------------------------- Lower Extremity Assessment Details Patient Name: Date of Service: [name] DA 03/08/2020 2:45 PM Medical Record Number: [id number] Patient Account Number: [id number] Date of Birth/Sex: Treating RN: 1961/03/26 (59 y.o. [name] [name] Primary Care [name]: Theodis Shove Other Clinician: Referring [name]: Treating [name]/Extender: [name] in Treatment: 3 Edema Assessment Assessed: [Left: No] [Right: No] Edema: [Left: Ye] [Right: s] Calf Left: Right: Point of Measurement: 35 cm From Medial Instep 41.7 cm Ankle Left: Right: Point of Measurement: 9 cm From Medial Instep 24 cm Vascular Assessment Pulses: Dorsalis Pedis Palpable:  [Left:Yes] Electronic Signature(s) Signed: 03/08/2020 5:21:24 PM By: [name] RN, BSN Entered By: [name] on 03/08/2020 15:33:45 -------------------------------------------------------------------------------- Multi-Disciplinary Care Plan Details Patient Name: Date of Service: [name], [name] DA 03/08/2020 2:45 PM Medical Record Number: [id number] Patient Account Number: [id number] Date of Birth/Sex: Treating RN: [birth year] (59 y.o. [name] [name] Primary Care [name]: Theodis Shove Other Clinician: Referring [name]: Treating [name]/Extender: [name] in Treatment: 3 Active Inactive Venous Leg Ulcer Nursing Diagnoses: Knowledge deficit related to disease process and management Potential for venous Insuffiency (use before diagnosis confirmed) Goals: Patient will maintain optimal edema control Date Initiated:  02/16/2020 Target Resolution Date: 03/16/2020 Goal Status: Active Interventions: Assess peripheral edema status every visit. Compression as ordered Provide education on venous insufficiency Treatment Activities: Therapeutic compression applied : 02/16/2020 Notes: Wound/Skin Impairment Nursing Diagnoses: Impaired tissue integrity Knowledge deficit related to ulceration/compromised skin integrity Goals: Patient/caregiver will verbalize understanding of skin care regimen Date Initiated: 02/16/2020 Target Resolution Date: 03/16/2020 Goal Status: Active Ulcer/skin breakdown will have a volume reduction of 30% by week 4 Date Initiated: 02/16/2020 Target Resolution Date: 03/15/2020 Goal Status: Active Interventions: Assess patient/caregiver ability to obtain necessary supplies Assess patient/caregiver ability to perform ulcer/skin care regimen upon admission and as needed Assess ulceration(s) every visit Provide education on ulcer and skin care Treatment Activities: Skin care regimen initiated : 02/16/2020 Topical  wound management initiated : 02/16/2020 Notes: Electronic Signature(s) Signed: 03/08/2020 5:21:24 PM By: [name] RN, BSN Entered By: [name] on 03/08/2020 15:36:19 -------------------------------------------------------------------------------- Pain Assessment Details Patient Name: Date of Service: [name], [name] DA 03/08/2020 2:45 PM Medical Record Number: [id number] Patient Account Number: [id number] Date of Birth/Sex: Treating RN: 30-Jan-1961 (59 y.o. [name] [name] Primary Care Eliana Lueth: Theodis Shove Other Clinician: Referring Theordore Cisnero: Treating Elverta Dimiceli/Extender: [name] in Treatment: 3 Active Problems Location of Pain Severity and Description of Pain Patient Has Paino No Site Locations Pain Management and Medication Current Pain Management: Electronic Signature(s) Signed: 03/08/2020 3:05:31 PM By: Karl Ito Signed: 03/08/2020 5:21:24 PM By: [name] RN, BSN Entered By: Karl Ito on 03/08/2020 15:05:14 -------------------------------------------------------------------------------- Patient/Caregiver Education Details Patient Name: Date of Service: [name], [name] DA 12/1/2021andnbsp2:45 PM Medical Record Number: [id number] Patient Account Number: [id number] Date of Birth/Gender: Treating RN: [birth year] (59 y.o. [name] [name] Primary Care Physician: Theodis Shove Other Clinician: Referring Physician: Treating Physician/Extender: Eliseo Squires in Treatment: 3 Education Assessment Education Provided To: Patient Education Topics Provided Venous: Methods: Explain/Verbal Responses: Reinforcements needed, State content correctly Electronic Signature(s) Signed: 03/08/2020 5:21:24 PM By: [name] RN, BSN Entered By: [name] on 03/08/2020 15:36:41 -------------------------------------------------------------------------------- Wound Assessment  Details Patient Name: Date of Service: [name], [name] DA 03/08/2020 2:45 PM Medical Record Number: [id number] Patient Account Number: [id number] Date of Birth/Sex: Treating RN: [birth year] (59 y.o. [name] [name] Primary Care Kaiven Vester: Theodis Shove Other Clinician: Referring Demico Ploch: Treating Montario Zilka/Extender: [name] in Treatment: 3 Wound Status Wound Number: 1 Primary Venous Leg Ulcer Etiology: Wound Location: Left, Medial Lower Leg Wound Status: Open Wounding Event: Gradually Appeared Comorbid Anemia, Sleep Apnea, Hypertension, Peripheral Venous Date Acquired: 01/26/2020 History: Disease Weeks Of Treatment: 3 Clustered Wound: No Wound Measurements Length: (cm) 2.9 Width: (cm) 1.4 Depth: (cm) 0.3 Area: (cm) 3.189 Volume: (cm) 0.957 % Reduction in Area: 5.3% % Reduction in Volume: 5.3% Epithelialization: Small (1-33%) Tunneling: No Undermining: No Wound Description Classification: Full Thickness Without Exposed Support Structures Wound Margin: Distinct, outline attached Exudate Amount: Medium Exudate Type: Serosanguineous Exudate Color: red, brown Foul Odor After Cleansing: No Slough/Fibrino Yes Wound Bed Granulation Amount: Medium (34-66%) Exposed Structure Granulation Quality: Pink, Pale Fascia Exposed: No Necrotic Amount: Medium (34-66%) Fat Layer (Subcutaneous Tissue) Exposed: Yes Necrotic Quality: Adherent Slough Tendon Exposed: No Muscle Exposed: No Joint Exposed: No Bone Exposed: No Treatment Notes Wound #1 (Left, Medial Lower Leg) 1. Cleanse With Wound Cleanser Soap and water 2. Periwound Care Moisturizing lotion 3. Primary Dressing Applied Collegen AG Hydrogel or K-Y Jelly 4. Secondary Dressing Dry Gauze Drawtex 6. Support Layer Applied 3 layer compression wrap Notes netting , Electronic Signature(s) Signed:  03/08/2020 5:21:24 PM By: [name] RN, BSN Entered By: [name] on  03/08/2020 15:34:28 -------------------------------------------------------------------------------- Vitals Details Patient Name: Date of Service: [name], [name] DA 03/08/2020 2:45 PM Medical Record Number: [id number] Patient Account Number: [id number] Date of Birth/Sex: Treating RN: Dec 06, 1960 (59 y.o. [name] [name] Primary Care Twila Rappa: Theodis Shove Other Clinician: Referring Avannah Decker: Treating Icarus Partch/Extender: [name] in Treatment: 3 Vital Signs Time Taken: 15:04 Temperature (F): 97.9 Height (in): 64 Pulse (bpm): 97 Weight (lbs): 278 Respiratory Rate (breaths/min): 18 Body Mass Index (BMI): 47.7 Blood Pressure (mmHg): 127/66 Reference Range: 80 - 120 mg / dl Electronic Signature(s) Signed: 03/08/2020 3:05:31 PM By: Karl Ito Entered By: Karl Ito on 03/08/2020 15:05:10

## 2020-03-08 NOTE — Progress Notes (Addendum)
[name], [name] ([id number]) Visit Report for 03/08/2020 Chief Complaint Document Details Patient Name: Date of Service: [name], [name] 03/08/2020 2:45 PM Medical Record Number: [id number] Patient Account Number: [id number] Date of Birth/Sex: Treating RN: 04/20/1960 (59 y.o. [name] StandardF) [name], [name] Primary Care Provider: Theodis [name], [name] Other Clinician: Referring Provider: Treating Provider/Extender: [name], [name], [name] Weeks in Treatment: 3 Information Obtained from: Patient Chief Complaint Left LE Ulcer Electronic Signature(s) Signed: 03/08/2020 3:17:20 PM By: [name], Starleen Trussell PA-C Entered By: [name], Keyonte Cookston on 03/08/2020 15:17:20 -------------------------------------------------------------------------------- HPI Details Patient Name: Date of Service: [name], [name] 03/08/2020 2:45 PM Medical Record Number: [id number] Patient Account Number: [id number] Date of Birth/Sex: Treating RN: 02/14/1961 (59 y.o. [name] StandardF) [name], [name] Primary Care Provider: Theodis [name], [name] Other Clinician: Referring Provider: Treating Provider/Extender: [name], [name], [name] Weeks in Treatment: 3 History of Present Illness HPI Description: 02/16/2020 upon evaluation today patient actually appears to be doing poorly in regard to her left medial lower extremity ulcer. This is actually an area that she tells me she has had intermittent issues with over the years although has been closed for some time she typically uses compression right now she has juxta lite compression wraps. With that being said she tells me that this nonetheless open several weeks/months ago and has been given her trouble since. She does have a history of chronic venous insufficiency she is seeing specialist for this in the past she has had an ablation as well as sclerotherapy. With that being said she also has hypertension chronically which is managed by her primary care provider. In general she seems to  be worsening overall with regard to the wound and states that she finally realized that she needed to come in and have somebody look at this and not continue to try to manage this on her own. No fevers, chills, nausea, vomiting, or diarrhea. 02/23/2020 on evaluation today patient appears to be doing well with regard to her wound. This is showing some signs of improvement which is great news still were not quite at the point where I would like to be as far as the overall appearance of the wound is concerned but I do believe this is better than last week. I do believe the Iodoflex is helping as well. 03/08/2020 upon evaluation today patient appears to be doing well with regard to her wound. She has been tolerating the dressing changes without complication. Fortunately I feel like she has made great progress with the Iodoflex but I feel like it may be the point rest to switch to something else possibly a collagen- based dressing at this time. Electronic Signature(s) Signed: 03/08/2020 3:39:15 PM By: [name], Bernardine Langworthy PA-C Entered By: [name], Amauri Keefe on 03/08/2020 15:39:15 -------------------------------------------------------------------------------- Physical Exam Details Patient Name: Date of Service: [name], [name] 03/08/2020 2:45 PM Medical Record Number: [id number] Patient Account Number: [id number] Date of Birth/Sex: Treating RN: 07/08/1960 (59 y.o. [name] StandardF) [name], [name] Primary Care Provider: Theodis [name], [name] Other Clinician: Referring Provider: Treating Provider/Extender: [name], Peyton Rossner [name], [name] Weeks in Treatment: 3 Constitutional Well-nourished and well-hydrated in no acute distress. Respiratory normal breathing without difficulty. Psychiatric this patient is able to make decisions and demonstrates good insight into disease process. Alert and Oriented x 3. pleasant and cooperative. Notes Upon inspection patient's wound bed actually showed signs of good granulation there was  no significant slough buildup no need for sharp debridement today. I was able to mechanically debride this with saline gauze. Post debridement wound bed appears  to be doing much better. Electronic Signature(s) Signed: 03/08/2020 3:39:43 PM By: [name] Kelp PA-C Entered By: [name] Kelp on 03/08/2020 15:39:42 -------------------------------------------------------------------------------- Physician Orders Details Patient Name: Date of Service: [name] 03/08/2020 2:45 PM Medical Record Number: [id number] Patient Account Number: [id number] Date of Birth/Sex: Treating RN: [birth year] (59 y.o. [name] Primary Care Provider: Theodis Shove Other Clinician: Referring Provider: Treating Provider/Extender: [name] Dace Weeks in Treatment: 3 Verbal / Phone Orders: No Diagnosis Coding ICD-10 Coding Code Description I87.2 Venous insufficiency (chronic) (peripheral) L97.822 Non-pressure chronic ulcer of other part of left lower leg with fat layer exposed I10 Essential (primary) hypertension Follow-up Appointments Return Appointment in 1 week. Dressing Change Frequency Do not change entire dressing for one week. Skin Barriers/Peri-Wound Care Moisturizing lotion - to leg Wound Cleansing May shower with protection. Primary Wound Dressing Wound #1 Left,Medial Lower Leg Silver Collagen - moisten with saline Secondary Dressing Wound #1 Left,Medial Lower Leg Dry Gauze Drawtex - cut to inside wound edges to hold collagen in place Edema Control 3 Layer Compression System - Left Lower Extremity - unna layer at top to secure Avoid standing for long periods of time Elevate legs to the level of the heart or above for 30 minutes daily and/or when sitting, a frequency of: - throughout the day Exercise regularly Support Garment 20-30 mm/Hg pressure to: - circaid juxtalite right leg daily Electronic Signature(s) Signed: 03/08/2020 4:34:22 PM By: [name] Kelp PA-C Signed: 03/08/2020 5:21:24 PM By: [name] RN, BSN Entered By: [name] on 03/08/2020 15:38:44 -------------------------------------------------------------------------------- Problem List Details Patient Name: Date of Service: [name], [name] 03/08/2020 2:45 PM Medical Record Number: [id number] Patient Account Number: [id number] Date of Birth/Sex: Treating RN: 17-Nov-1960 (59 y.o. [name] Primary Care Provider: Theodis Shove Other Clinician: Referring Provider: Treating Provider/Extender: [name] Dace Weeks in Treatment: 3 Active Problems ICD-10 Encounter Code Description Active Date MDM Diagnosis I87.2 Venous insufficiency (chronic) (peripheral) 02/16/2020 No Yes L97.822 Non-pressure chronic ulcer of other part of left lower leg with fat layer 02/16/2020 No Yes exposed I10 Essential (primary) hypertension 02/16/2020 No Yes Inactive Problems Resolved Problems Electronic Signature(s) Signed: 03/08/2020 3:17:12 PM By: [name] Kelp PA-C Entered By: [name] Kelp on 03/08/2020 15:17:11 -------------------------------------------------------------------------------- Progress Note Details Patient Name: Date of Service: [place], [name] 03/08/2020 2:45 PM Medical Record Number: [id number] Patient Account Number: [id number] Date of Birth/Sex: Treating RN: 12-Feb-1961 (58 y.o. [name] Primary Care Provider: Other Clinician: Theodis Shove Referring Provider: Treating Provider/Extender: [name] Dace Weeks in Treatment: 3 Subjective Chief Complaint Information obtained from Patient Left LE Ulcer History of Present Illness (HPI) 02/16/2020 upon evaluation today patient actually appears to be doing poorly in regard to her left medial lower extremity ulcer. This is actually an area that she tells me she has had intermittent issues with over the years although has been closed for some  time she typically uses compression right now she has juxta lite compression wraps. With that being said she tells me that this nonetheless open several weeks/months ago and has been given her trouble since. She does have a history of chronic venous insufficiency she is seeing specialist for this in the past she has had an ablation as well as sclerotherapy. With that being said she also has hypertension chronically which is managed by her primary care provider. In general she seems to be worsening overall with regard to the wound and states  that she finally realized that she needed to come in and have somebody look at this and not continue to try to manage this on her own. No fevers, chills, nausea, vomiting, or diarrhea. 02/23/2020 on evaluation today patient appears to be doing well with regard to her wound. This is showing some signs of improvement which is great news still were not quite at the point where I would like to be as far as the overall appearance of the wound is concerned but I do believe this is better than last week. I do believe the Iodoflex is helping as well. 03/08/2020 upon evaluation today patient appears to be doing well with regard to her wound. She has been tolerating the dressing changes without complication. Fortunately I feel like she has made great progress with the Iodoflex but I feel like it may be the point rest to switch to something else possibly a collagen- based dressing at this time. Objective Constitutional Well-nourished and well-hydrated in no acute distress. Vitals Time Taken: 3:04 PM, Height: 64 in, Weight: 278 lbs, BMI: 47.7, Temperature: 97.9 F, Pulse: 97 bpm, Respiratory Rate: 18 breaths/min, Blood Pressure: 127/66 mmHg. Respiratory normal breathing without difficulty. Psychiatric this patient is able to make decisions and demonstrates good insight into disease process. Alert and Oriented x 3. pleasant and cooperative. General Notes: Upon inspection  patient's wound bed actually showed signs of good granulation there was no significant slough buildup no need for sharp debridement today. I was able to mechanically debride this with saline gauze. Post debridement wound bed appears to be doing much better. Integumentary (Hair, Skin) Wound #1 status is Open. Original cause of wound was Gradually Appeared. The wound is located on the Left,Medial Lower Leg. The wound measures 2.9cm length x 1.4cm width x 0.3cm depth; 3.189cm^2 area and 0.957cm^3 volume. There is Fat Layer (Subcutaneous Tissue) exposed. There is no tunneling or undermining noted. There is a medium amount of serosanguineous drainage noted. The wound margin is distinct with the outline attached to the wound base. There is medium (34-66%) pink, pale granulation within the wound bed. There is a medium (34-66%) amount of necrotic tissue within the wound bed including Adherent Slough. Assessment Active Problems ICD-10 Venous insufficiency (chronic) (peripheral) Non-pressure chronic ulcer of other part of left lower leg with fat layer exposed Essential (primary) hypertension Procedures Wound #1 Pre-procedure diagnosis of Wound #1 is a Venous Leg Ulcer located on the Left,Medial Lower Leg . There was a Three Layer Compression Therapy Procedure by Yevonne Pax, RN. Post procedure Diagnosis Wound #1: Same as Pre-Procedure Plan Follow-up Appointments: Return Appointment in 1 week. Dressing Change Frequency: Do not change entire dressing for one week. Skin Barriers/Peri-Wound Care: Moisturizing lotion - to leg Wound Cleansing: May shower with protection. Primary Wound Dressing: Wound #1 Left,Medial Lower Leg: Silver Collagen - moisten with saline Secondary Dressing: Wound #1 Left,Medial Lower Leg: Dry Gauze Drawtex - cut to inside wound edges to hold collagen in place Edema Control: 3 Layer Compression System - Left Lower Extremity - unna layer at top to secure Avoid standing  for long periods of time Elevate legs to the level of the heart or above for 30 minutes daily and/or when sitting, a frequency of: - throughout the day Exercise regularly Support Garment 20-30 mm/Hg pressure to: - circaid juxtalite right leg daily 1. I would recommend currently that we go ahead and initiate treatment with a 3 layer compression wrap to be continued as I feel like that is doing well.  2. I am also can recommend that we have the patient go ahead and have a silver collagen dressing moistened with saline followed by drawtex to help fill in the space so that hopefully this will more appropriately heal in. I am hopeful this will stimulate additional tissue growth at this point. 3. I am also can recommend the patient continue to monitor for any signs of worsening infection such as increased pain she has any issues as such she should let me know. We will see patient back for reevaluation in 1 week here in the clinic. If anything worsens or changes patient will contact our office for additional recommendations. Electronic Signature(s) Signed: 03/08/2020 3:40:37 PM By: [name] Kelp PA-C Entered By: [name] Kelp on 03/08/2020 15:40:37 -------------------------------------------------------------------------------- SuperBill Details Patient Name: Date of Service: [place], [name] 03/08/2020 Medical Record Number: [id number] Patient Account Number: [id number] Date of Birth/Sex: Treating RN: 01-May-1960 (59 y.o. [name] Primary Care Provider: Theodis Shove Other Clinician: Referring Provider: Treating Provider/Extender: [name] Dace Weeks in Treatment: 3 Diagnosis Coding ICD-10 Codes Code Description I87.2 Venous insufficiency (chronic) (peripheral) L97.822 Non-pressure chronic ulcer of other part of left lower leg with fat layer exposed I10 Essential (primary) hypertension Facility Procedures CPT4 Code: [id number] Description: (Facility Use  Only) [phone] - APPLY MULTLAY COMPRS LWR LT LEG Modifier: Quantity: 1 Physician Procedures : CPT4 Code Description Modifier [id number] - WC PHYS LEVEL 3 - EST PT 1 ICD-10 Diagnosis Description I87.2 Venous insufficiency (chronic) (peripheral) L97.822 Non-pressure chronic ulcer of other part of left lower leg with fat layer exposed I10  Essential (primary) hypertension Quantity: Electronic Signature(s) Signed: 03/08/2020 3:41:05 PM By: [name] Kelp PA-C Entered By: [name] Kelp on 03/08/2020 15:41:05

## 2020-03-15 ENCOUNTER — Other Ambulatory Visit: Payer: Self-pay

## 2020-03-15 ENCOUNTER — Encounter (HOSPITAL_BASED_OUTPATIENT_CLINIC_OR_DEPARTMENT_OTHER): Payer: BC Managed Care – PPO | Admitting: Physician Assistant

## 2020-03-15 DIAGNOSIS — I872 Venous insufficiency (chronic) (peripheral): Secondary | ICD-10-CM | POA: Diagnosis not present

## 2020-03-15 NOTE — Progress Notes (Addendum)
AZELEA, SEGUIN ([id number]) Visit Report for 03/15/2020 Chief Complaint Document Details Patient Name: Date of Service: [name] [name] 03/15/2020 3:45 PM Medical Record Number: [id number] Patient Account Number: [id number] Date of Birth/Sex: Treating RN: [birth year] (59 y.o. [name] Primary Care Provider: Theodis Shove Other Clinician: Referring Provider: Treating Provider/Extender: [name] in Treatment: 4 Information Obtained from: Patient Chief Complaint Left LE Ulcer Electronic Signature(s) Signed: 03/15/2020 3:43:41 PM By: Lenda Kelp PA-C Entered By: Lenda Kelp on 03/15/2020 15:43:41 -------------------------------------------------------------------------------- Debridement Details Patient Name: Date of Service: [name], [name] 03/15/2020 3:45 PM Medical Record Number: [id number] Patient Account Number: [id number] Date of Birth/Sex: Treating RN: 12-Apr-1960 (59 y.o. [name] Primary Care Provider: Theodis Shove Other Clinician: Referring Provider: Treating Provider/Extender: [name] in Treatment: 4 Debridement Performed for Assessment: Wound #1 Left,Medial Lower Leg Performed By: Physician Lenda Kelp, PA Debridement Type: Debridement Severity of Tissue Pre Debridement: Fat layer exposed Level of Consciousness (Pre-procedure): Awake and Alert Pre-procedure Verification/Time Out Yes - 16:55 Taken: Start Time: 16:56 Pain Control: Lidocaine 4% T opical Solution T Area Debrided (L x W): otal 2.8 (cm) x 1.3 (cm) = 3.64 (cm) Tissue and other material debrided: Viable, Non-Viable, Slough, Subcutaneous, Slough Level: Skin/Subcutaneous Tissue Debridement Description: Excisional Instrument: Curette Bleeding: Minimum Hemostasis Achieved: Pressure End Time: 16:58 Procedural Pain: 2 Post Procedural Pain: 0 Response to Treatment: Procedure was tolerated well Level of  Consciousness (Post- Awake and Alert procedure): Post Debridement Measurements of Total Wound Length: (cm) 2.8 Width: (cm) 1.3 Depth: (cm) 0.2 Volume: (cm) 0.572 Character of Wound/Ulcer Post Debridement: Improved Severity of Tissue Post Debridement: Fat layer exposed Post Procedure Diagnosis Same as Pre-procedure Electronic Signature(s) Signed: 03/15/2020 6:12:31 PM By: [name] RN, BSN Signed: 03/16/2020 1:30:16 PM By: Lenda Kelp PA-C Entered By: [name] on 03/15/2020 16:59:03 -------------------------------------------------------------------------------- HPI Details Patient Name: Date of Service: [name], [name] [name] 03/15/2020 3:45 PM Medical Record Number: [id number] Patient Account Number: [id number] Date of Birth/Sex: Treating RN: March 16, 1961 (59 y.o. [name] Primary Care Provider: Theodis Shove Other Clinician: Referring Provider: Treating Provider/Extender: [name] in Treatment: 4 History of Present Illness HPI Description: 02/16/2020 upon evaluation today patient actually appears to be doing poorly in regard to her left medial lower extremity ulcer. This is actually an area that she tells me she has had intermittent issues with over the years although has been closed for some time she typically uses compression right now she has juxta lite compression wraps. With that being said she tells me that this nonetheless open several weeks/months ago and has been given her trouble since. She does have a history of chronic venous insufficiency she is seeing specialist for this in the past she has had an ablation as well as sclerotherapy. With that being said she also has hypertension chronically which is managed by her primary care provider. In general she seems to be worsening overall with regard to the wound and states that she finally realized that she needed to come in and have somebody look at this and not continue  to try to manage this on her own. No fevers, chills, nausea, vomiting, or diarrhea. 02/23/2020 on evaluation today patient appears to be doing well with regard to her wound. This is showing some signs of improvement which is great news still were not quite at the point where I would like to be as far as the overall  appearance of the wound is concerned but I do believe this is better than last week. I do believe the Iodoflex is helping as well. 03/08/2020 upon evaluation today patient appears to be doing well with regard to her wound. She has been tolerating the dressing changes without complication. Fortunately I feel like she has made great progress with the Iodoflex but I feel like it may be the point rest to switch to something else possibly a collagen- based dressing at this time. 03/15/2020 upon evaluation today patient appears to be doing excellent in regard to her leg ulcer. She has been tolerating the dressing changes without complication. Fortunately there is no signs of active infection. Overall she is measuring a little bit smaller today which is great news. Electronic Signature(s) Signed: 03/15/2020 5:00:03 PM By: Lenda KelpStone III, [name] Sharples PA-C Entered By: Lenda KelpStone III, Keishawn Darsey on 03/15/2020 17:00:02 -------------------------------------------------------------------------------- Physical Exam Details Patient Name: Date of Service: [name], [name] 03/15/2020 3:45 PM Medical Record Number: [id number] Patient Account Number: [id number] Date of Birth/Sex: Treating RN: 05/14/1960 (59 y.o. [name]) [name], [name] Primary Care Provider: Theodis ShoveKoberlein, [name] Other Clinician: Referring Provider: Treating Provider/Extender: Esaw DaceStone III, [name] [name], [name] Weeks in Treatment: 4 Constitutional Well-nourished and well-hydrated in no acute distress. Respiratory normal breathing without difficulty. Psychiatric this patient is able to make decisions and demonstrates good insight into disease process. Alert and  Oriented x 3. pleasant and cooperative. Notes Patient's wound bed actually showed signs of good granulation and epithelization around the edges of the wound. She does still have a little bit of slough and biofilm buildup on the surface of the wound I did perform sharp debridement today to clear this away post debridement wound bed appears to be doing much better. Electronic Signature(s) Signed: 03/15/2020 5:00:19 PM By: Lenda KelpStone III, Autum Benfer PA-C Entered By: Lenda KelpStone III, Graylee Arutyunyan on 03/15/2020 17:00:18 -------------------------------------------------------------------------------- Physician Orders Details Patient Name: Date of Service: [name], [name] 03/15/2020 3:45 PM Medical Record Number: [id number] Patient Account Number: [id number] Date of Birth/Sex: Treating RN: 07/05/1960 (59 y.o. [name]) [name], [name] Primary Care Provider: Theodis ShoveKoberlein, [name] Other Clinician: Referring Provider: Treating Provider/Extender: Esaw DaceStone III, Calyn Sivils [name], [name] Weeks in Treatment: 4 Verbal / Phone Orders: No Diagnosis Coding ICD-10 Coding Code Description I87.2 Venous insufficiency (chronic) (peripheral) L97.822 Non-pressure chronic ulcer of other part of left lower leg with fat layer exposed I10 Essential (primary) hypertension Follow-up Appointments Return Appointment in 1 week. Bathing/ Shower/ Hygiene May shower with protection but do not get wound dressing(s) wet. Edema Control - Lymphedema / SCD / Other Bilateral Lower Extremities Elevate legs to the level of the heart or above for 30 minutes daily and/or when sitting, a frequency of: Avoid standing for long periods of time. Exercise regularly Wound Treatment Wound #1 - Lower Leg Wound Laterality: Left, Medial Peri-Wound Care: Sween Lotion (Moisturizing lotion) 1 x Per Week Discharge Instructions: Apply moisturizing lotion as directed Prim Dressing: Promogran Prisma Matrix, 4.34 (sq in) (silver collagen) 1 x Per Week ary Discharge Instructions:  Moisten collagen with saline or hydrogel Secondary Dressing: Woven Gauze Sponge, Non-Sterile 4x4 in 1 x Per Week Discharge Instructions: Apply over primary dressing as directed. Secondary Dressing: Drawtex 4x4 in 1 x Per Week Discharge Instructions: Apply over primary dressing cut to fit inside wound margins to hold collagen in place Compression Wrap: ThreePress (3 layer compression wrap) 1 x Per Week Discharge Instructions: Apply three layer compression as directed. Patient Medications llergies: Iodinated Contrast Media, Sulfa (Sulfonamide Antibiotics) A Notifications Medication Indication Start End prior  to debridement 03/15/2020 lidocaine DOSE topical 4 % cream - cream topical Electronic Signature(s) Signed: 03/15/2020 6:12:31 PM By: [name] RN, BSN Signed: 03/16/2020 1:30:16 PM By: Lenda Kelp PA-C Entered By: [name] on 03/15/2020 17:02:11 -------------------------------------------------------------------------------- Problem List Details Patient Name: Date of Service: [name], [name] 03/15/2020 3:45 PM Medical Record Number: [id number] Patient Account Number: [id number] Date of Birth/Sex: Treating RN: Jun 08, 1960 (59 y.o. [name] Primary Care Provider: Theodis Shove Other Clinician: Referring Provider: Treating Provider/Extender: [name] in Treatment: 4 Active Problems ICD-10 Encounter Code Description Active Date MDM Diagnosis I87.2 Venous insufficiency (chronic) (peripheral) 02/16/2020 No Yes L97.822 Non-pressure chronic ulcer of other part of left lower leg with fat layer 02/16/2020 No Yes exposed I10 Essential (primary) hypertension 02/16/2020 No Yes Inactive Problems Resolved Problems Electronic Signature(s) Signed: 03/15/2020 3:43:34 PM By: Lenda Kelp PA-C Entered By: Lenda Kelp on 03/15/2020  15:43:33 -------------------------------------------------------------------------------- Progress Note Details Patient Name: Date of Service: [name], [name] 03/15/2020 3:45 PM Medical Record Number: [id number] Patient Account Number: [id number] Date of Birth/Sex: Treating RN: 1960-10-07 (59 y.o. [name] Primary Care Provider: Theodis Shove Other Clinician: Referring Provider: Treating Provider/Extender: [name] in Treatment: 4 Subjective Chief Complaint Information obtained from Patient Left LE Ulcer History of Present Illness (HPI) 02/16/2020 upon evaluation today patient actually appears to be doing poorly in regard to her left medial lower extremity ulcer. This is actually an area that she tells me she has had intermittent issues with over the years although has been closed for some time she typically uses compression right now she has juxta lite compression wraps. With that being said she tells me that this nonetheless open several weeks/months ago and has been given her trouble since. She does have a history of chronic venous insufficiency she is seeing specialist for this in the past she has had an ablation as well as sclerotherapy. With that being said she also has hypertension chronically which is managed by her primary care provider. In general she seems to be worsening overall with regard to the wound and states that she finally realized that she needed to come in and have somebody look at this and not continue to try to manage this on her own. No fevers, chills, nausea, vomiting, or diarrhea. 02/23/2020 on evaluation today patient appears to be doing well with regard to her wound. This is showing some signs of improvement which is great news still were not quite at the point where I would like to be as far as the overall appearance of the wound is concerned but I do believe this is better than last week. I do believe the  Iodoflex is helping as well. 03/08/2020 upon evaluation today patient appears to be doing well with regard to her wound. She has been tolerating the dressing changes without complication. Fortunately I feel like she has made great progress with the Iodoflex but I feel like it may be the point rest to switch to something else possibly a collagen- based dressing at this time. 03/15/2020 upon evaluation today patient appears to be doing excellent in regard to her leg ulcer. She has been tolerating the dressing changes without complication. Fortunately there is no signs of active infection. Overall she is measuring a little bit smaller today which is great news. Objective Constitutional Well-nourished and well-hydrated in no acute distress. Vitals Time Taken: 4:21 PM, Height: 64 in, Weight: 278 lbs, BMI: 47.7, Temperature:  98.3 F, Pulse: 81 bpm, Respiratory Rate: 18 breaths/min, Blood Pressure: 139/73 mmHg. Respiratory normal breathing without difficulty. Psychiatric this patient is able to make decisions and demonstrates good insight into disease process. Alert and Oriented x 3. pleasant and cooperative. General Notes: Patient's wound bed actually showed signs of good granulation and epithelization around the edges of the wound. She does still have a little bit of slough and biofilm buildup on the surface of the wound I did perform sharp debridement today to clear this away post debridement wound bed appears to be doing much better. Integumentary (Hair, Skin) Wound #1 status is Open. Original cause of wound was Gradually Appeared. The wound is located on the Left,Medial Lower Leg. The wound measures 2.8cm length x 1.3cm width x 0.2cm depth; 2.859cm^2 area and 0.572cm^3 volume. There is Fat Layer (Subcutaneous Tissue) exposed. There is no tunneling or undermining noted. There is a medium amount of serosanguineous drainage noted. The wound margin is distinct with the outline attached to the wound  base. There is medium (34-66%) pink, pale granulation within the wound bed. There is a medium (34-66%) amount of necrotic tissue within the wound bed including Adherent Slough. Assessment Active Problems ICD-10 Venous insufficiency (chronic) (peripheral) Non-pressure chronic ulcer of other part of left lower leg with fat layer exposed Essential (primary) hypertension Procedures Wound #1 Pre-procedure diagnosis of Wound #1 is a Venous Leg Ulcer located on the Left,Medial Lower Leg .Severity of Tissue Pre Debridement is: Fat layer exposed. There was a Excisional Skin/Subcutaneous Tissue Debridement with a total area of 3.64 sq cm performed by Lenda Kelp, PA. With the following instrument(s): Curette to remove Viable and Non-Viable tissue/material. Material removed includes Subcutaneous Tissue and Slough and after achieving pain control using Lidocaine 4% T opical Solution. No specimens were taken. A time out was conducted at 16:55, prior to the start of the procedure. A Minimum amount of bleeding was controlled with Pressure. The procedure was tolerated well with a pain level of 2 throughout and a pain level of 0 following the procedure. Post Debridement Measurements: 2.8cm length x 1.3cm width x 0.2cm depth; 0.572cm^3 volume. Character of Wound/Ulcer Post Debridement is improved. Severity of Tissue Post Debridement is: Fat layer exposed. Post procedure Diagnosis Wound #1: Same as Pre-Procedure Pre-procedure diagnosis of Wound #1 is a Venous Leg Ulcer located on the Left,Medial Lower Leg . There was a Three Layer Compression Therapy Procedure by Yevonne Pax, RN. Post procedure Diagnosis Wound #1: Same as Pre-Procedure Plan Follow-up Appointments: Return Appointment in 1 week. Bathing/ Shower/ Hygiene: May shower with protection but do not get wound dressing(s) wet. Edema Control - Lymphedema / SCD / Other: Elevate legs to the level of the heart or above for 30 minutes daily and/or  when sitting, a frequency of: Avoid standing for long periods of time. Exercise regularly The following medication(s) was prescribed: lidocaine topical 4 % cream cream topical for prior to debridement was prescribed at facility WOUND #1: - Lower Leg Wound Laterality: Left, Medial Peri-Wound Care: Sween Lotion (Moisturizing lotion) 1 x Per Week/ Discharge Instructions: Apply moisturizing lotion as directed Prim Dressing: Promogran Prisma Matrix, 4.34 (sq in) (silver collagen) 1 x Per Week/ ary Discharge Instructions: Moisten collagen with saline or hydrogel Secondary Dressing: Woven Gauze Sponge, Non-Sterile 4x4 in 1 x Per Week/ Discharge Instructions: Apply over primary dressing as directed. Secondary Dressing: Drawtex 4x4 in 1 x Per Week/ Discharge Instructions: Apply over primary dressing cut to fit inside wound margins to hold collagen  in place Com pression Wrap: ThreePress (3 layer compression wrap) 1 x Per Week/ Discharge Instructions: Apply three layer compression as directed. 1. I would recommend currently that we going to continue with the wound care measures as before specifically working to continue with the silver collagen which I feel like has been helpful when using drawtex and behind. 2. I am also can recommend that we continue with the compression wrap as I feel like that is doing an excellent job for her. 3. It also recommend the patient continue to elevate her legs much as possible try to keep edema under good control. We will see patient back for reevaluation in 1 week here in the clinic. If anything worsens or changes patient will contact our office for additional recommendations. Electronic Signature(s) Signed: 03/15/2020 6:12:31 PM By: [name] RN, BSN Signed: 03/16/2020 1:30:16 PM By: Lenda Kelp PA-C Previous Signature: 03/15/2020 5:00:51 PM Version By: Lenda Kelp PA-C Entered By: [name] on 03/15/2020  17:02:20 -------------------------------------------------------------------------------- SuperBill Details Patient Name: Date of Service: [name], [name] 03/15/2020 Medical Record Number: [id number] Patient Account Number: [id number] Date of Birth/Sex: Treating RN: [birth year] (58 y.o. [name] Primary Care Provider: Theodis Shove Other Clinician: Referring Provider: Treating Provider/Extender: [name] in Treatment: 4 Diagnosis Coding ICD-10 Codes Code Description I87.2 Venous insufficiency (chronic) (peripheral) L97.822 Non-pressure chronic ulcer of other part of left lower leg with fat layer exposed I10 Essential (primary) hypertension Facility Procedures Physician Procedures : CPT4 Code Description Modifier [id number] - WC PHYS SUBQ TISS 20 SQ CM ICD-10 Diagnosis Description L97.822 Non-pressure chronic ulcer of other part of left lower leg with fat layer exposed Quantity: 1 Electronic Signature(s) Signed: 03/15/2020 5:00:58 PM By: Lenda Kelp PA-C Entered By: Lenda Kelp on 03/15/2020 17:00:58

## 2020-03-16 NOTE — Progress Notes (Signed)
AMRIE, GURGANUS ([id number]) Visit Report for 03/15/2020 Arrival Information Details Patient Name: Date of Service: [name] DA 03/15/2020 3:45 PM Medical Record Number: [id number] Patient Account Number: [id number] Date of Birth/Sex: Treating RN: 05/26/1960 (59 y.o. [name] Primary Care Timothey Dahlstrom: Theodis Shove Other Clinician: Referring Kelyse Pask: Treating Consepcion Utt/Extender: [name] in Treatment: 4 Visit Information History Since Last Visit Added or deleted any medications: No Patient Arrived: Ambulatory Any new allergies or adverse reactions: No Arrival Time: 16:20 Had a fall or experienced change in No Accompanied By: alone activities of daily living that may affect Transfer Assistance: None risk of falls: Patient Identification Verified: Yes Signs or symptoms of abuse/neglect since last visito No Patient Requires Transmission-Based Precautions: No Hospitalized since last visit: No Patient Has Alerts: Yes Implantable device outside of the clinic excluding No Patient Alerts: ABI noncompressible cellular tissue based products placed in the center since last visit: Has Dressing in Place as Prescribed: Yes Has Compression in Place as Prescribed: Yes Pain Present Now: No Electronic Signature(s) Signed: 03/16/2020 4:05:55 PM By: Zandra Abts RN, BSN Entered By: Zandra Abts on 03/15/2020 16:21:09 -------------------------------------------------------------------------------- Compression Therapy Details Patient Name: Date of Service: [name], [name] DA 03/15/2020 3:45 PM Medical Record Number: [id number] Patient Account Number: [id number] Date of Birth/Sex: Treating RN: 11/22/1960 (59 y.o. [name] Primary Care Yurika Pereda: Theodis Shove Other Clinician: Referring Danyella Mcginty: Treating Kemond Amorin/Extender: [name] in Treatment: 4 Compression Therapy Performed for Wound Assessment: Wound  #1 Left,Medial Lower Leg Performed By: Clinician Yevonne Pax, RN Compression Type: Three Layer Post Procedure Diagnosis Same as Pre-procedure Electronic Signature(s) Signed: 03/15/2020 6:12:31 PM By: [name] RN, BSN Entered By: [name] on 03/15/2020 16:57:52 -------------------------------------------------------------------------------- Encounter Discharge Information Details Patient Name: Date of Service: [place], [name] DA 03/15/2020 3:45 PM Medical Record Number: [id number] Patient Account Number: [id number] Date of Birth/Sex: Treating RN: 1960/10/31 (59 y.o. [name] Primary Care Tayvian Holycross: Theodis Shove Other Clinician: Referring Abbagail Scaff: Treating Danton Palmateer/Extender: [name] in Treatment: 4 Encounter Discharge Information Items Post Procedure Vitals Discharge Condition: Stable Temperature (F): 98.3 Ambulatory Status: Ambulatory Pulse (bpm): 81 Discharge Destination: Home Respiratory Rate (breaths/min): 18 Transportation: Private Auto Blood Pressure (mmHg): 139/73 Accompanied By: self Schedule Follow-up Appointment: Yes Clinical Summary of Care: Electronic Signature(s) Signed: 03/15/2020 5:17:43 PM By: Shawn Stall Entered By: Shawn Stall on 03/15/2020 17:07:56 -------------------------------------------------------------------------------- Lower Extremity Assessment Details Patient Name: Date of Service: [name] DA 03/15/2020 3:45 PM Medical Record Number: [id number] Patient Account Number: [id number] Date of Birth/Sex: Treating RN: [birth year] (59 y.o. [name] Primary Care Abbeygail Igoe: Theodis Shove Other Clinician: Referring Brittony Billick: Treating Lasundra Hascall/Extender: [name] in Treatment: 4 Edema Assessment Assessed: [Left: No] [Right: No] Edema: [Left: Ye] [Right: s] Calf Left: Right: Point of Measurement: 35 cm From Medial Instep 44.5 cm Ankle Left:  Right: Point of Measurement: 9 cm From Medial Instep 23.5 cm Vascular Assessment Pulses: Dorsalis Pedis Palpable: [Left:Yes] Electronic Signature(s) Signed: 03/16/2020 4:05:55 PM By: Zandra Abts RN, BSN Entered By: Zandra Abts on 03/15/2020 16:22:43 -------------------------------------------------------------------------------- Pain Assessment Details Patient Name: Date of Service: [name], [name] DA 03/15/2020 3:45 PM Medical Record Number: [id number] Patient Account Number: [id number] Date of Birth/Sex: Treating RN: 08/17/1960 (59 y.o. [name] Primary Care Evola Hollis: Theodis Shove Other Clinician: Referring Emilene Roma: Treating Traeh Milroy/Extender: [name] in Treatment: 4 Active Problems Location of Pain Severity and Description of Pain Patient Has  Paino No Site Locations Pain Management and Medication Current Pain Management: Electronic Signature(s) Signed: 03/16/2020 4:05:55 PM By: Zandra Abts RN, BSN Entered By: Zandra Abts on 03/15/2020 16:21:28 -------------------------------------------------------------------------------- Wound Assessment Details Patient Name: Date of Service: [name], [name] DA 03/15/2020 3:45 PM Medical Record Number: [id number] Patient Account Number: [id number] Date of Birth/Sex: Treating RN: August 06, 1960 (59 y.o. [name] Primary Care Julie-Anne Torain: Theodis Shove Other Clinician: Referring Kayslee Furey: Treating Ryanne Morand/Extender: [name] in Treatment: 4 Wound Status Wound Number: 1 Primary Venous Leg Ulcer Etiology: Wound Location: Left, Medial Lower Leg Wound Status: Open Wounding Event: Gradually Appeared Comorbid Anemia, Sleep Apnea, Hypertension, Peripheral Venous Date Acquired: 01/26/2020 History: Disease Weeks Of Treatment: 4 Clustered Wound: No Wound Measurements Length: (cm) 2.8 Width: (cm) 1.3 Depth: (cm) 0.2 Area: (cm) 2.859 Volume:  (cm) 0.572 % Reduction in Area: 15.1% % Reduction in Volume: 43.4% Epithelialization: Small (1-33%) Tunneling: No Undermining: No Wound Description Classification: Full Thickness Without Exposed Support Structures Wound Margin: Distinct, outline attached Exudate Amount: Medium Exudate Type: Serosanguineous Exudate Color: red, brown Foul Odor After Cleansing: No Slough/Fibrino Yes Wound Bed Granulation Amount: Medium (34-66%) Exposed Structure Granulation Quality: Pink, Pale Fascia Exposed: No Necrotic Amount: Medium (34-66%) Fat Layer (Subcutaneous Tissue) Exposed: Yes Necrotic Quality: Adherent Slough Tendon Exposed: No Muscle Exposed: No Joint Exposed: No Bone Exposed: No Treatment Notes Wound #1 (Lower Leg) Wound Laterality: Left, Medial Cleanser Peri-Wound Care Sween Lotion (Moisturizing lotion) Discharge Instruction: Apply moisturizing lotion as directed Topical Primary Dressing Promogran Prisma Matrix, 4.34 (sq in) (silver collagen) Discharge Instruction: Moisten collagen with saline or hydrogel Secondary Dressing Woven Gauze Sponge, Non-Sterile 4x4 in Discharge Instruction: Apply over primary dressing as directed. Drawtex 4x4 in Discharge Instruction: Apply over primary dressing cut to fit inside wound margins to hold collagen in place Secured With Compression Wrap ThreePress (3 layer compression wrap) Discharge Instruction: Apply three layer compression as directed. Compression Stockings Add-Ons Electronic Signature(s) Signed: 03/16/2020 4:05:55 PM By: Zandra Abts RN, BSN Entered By: Zandra Abts on 03/15/2020 16:22:20 -------------------------------------------------------------------------------- Vitals Details Patient Name: Date of Service: [name], [name] DA 03/15/2020 3:45 PM Medical Record Number: [id number] Patient Account Number: [id number] Date of Birth/Sex: Treating RN: 03/04/1961 (59 y.o. [name] Primary Care Salim Forero: Theodis Shove Other Clinician: Referring Tenea Sens: Treating Mychael Soots/Extender: [name] in Treatment: 4 Vital Signs Time Taken: 16:21 Temperature (F): 98.3 Height (in): 64 Pulse (bpm): 81 Weight (lbs): 278 Respiratory Rate (breaths/min): 18 Body Mass Index (BMI): 47.7 Blood Pressure (mmHg): 139/73 Reference Range: 80 - 120 mg / dl Electronic Signature(s) Signed: 03/16/2020 4:05:55 PM By: Zandra Abts RN, BSN Entered By: Zandra Abts on 03/15/2020 16:21:23

## 2020-03-22 ENCOUNTER — Encounter (HOSPITAL_BASED_OUTPATIENT_CLINIC_OR_DEPARTMENT_OTHER): Payer: BC Managed Care – PPO | Admitting: Physician Assistant

## 2020-03-22 ENCOUNTER — Other Ambulatory Visit: Payer: Self-pay

## 2020-03-22 DIAGNOSIS — I872 Venous insufficiency (chronic) (peripheral): Secondary | ICD-10-CM | POA: Diagnosis not present

## 2020-03-22 NOTE — Progress Notes (Addendum)
KHUSHBU, PIPPEN ([id number]) Visit Report for 03/22/2020 Chief Complaint Document Details Patient Name: Date of Service: [name] DA 03/22/2020 3:30 PM Medical Record Number: [id number] Patient Account Number: [id number] Date of Birth/Sex: Treating RN: 13-Sep-1960 (59 y.o. [name] Primary Care Provider: Theodis Shove Other Clinician: Referring Provider: Treating Provider/Extender: [name] in Treatment: 5 Information Obtained from: Patient Chief Complaint Left LE Ulcer Electronic Signature(s) Signed: 03/22/2020 3:24:44 PM By: Lenda Kelp PA-C Entered By: Lenda Kelp on 03/22/2020 15:24:43 -------------------------------------------------------------------------------- Debridement Details Patient Name: Date of Service: [place], [name] DA 03/22/2020 3:30 PM Medical Record Number: [id number] Patient Account Number: [id number] Date of Birth/Sex: Treating RN: 1960-09-05 (59 y.o. [name] Primary Care Provider: Theodis Shove Other Clinician: Referring Provider: Treating Provider/Extender: [name] in Treatment: 5 Debridement Performed for Assessment: Wound #1 Left,Medial Lower Leg Performed By: Physician Lenda Kelp, PA Debridement Type: Debridement Severity of Tissue Pre Debridement: Fat layer exposed Level of Consciousness (Pre-procedure): Awake and Alert Pre-procedure Verification/Time Out Yes - 15:55 Taken: Start Time: 15:56 Pain Control: Lidocaine 5% topical ointment T Area Debrided (L x W): otal 2.5 (cm) x 1 (cm) = 2.5 (cm) Tissue and other material debrided: Viable, Non-Viable, Slough, Subcutaneous, Slough Level: Skin/Subcutaneous Tissue Debridement Description: Excisional Instrument: Curette Bleeding: Minimum Hemostasis Achieved: Pressure End Time: 15:59 Procedural Pain: 0 Post Procedural Pain: 0 Response to Treatment: Procedure was tolerated well Level of  Consciousness (Post- Awake and Alert procedure): Post Debridement Measurements of Total Wound Length: (cm) 2.5 Width: (cm) 1 Depth: (cm) 0.2 Volume: (cm) 0.393 Character of Wound/Ulcer Post Debridement: Improved Severity of Tissue Post Debridement: Fat layer exposed Post Procedure Diagnosis Same as Pre-procedure Electronic Signature(s) Signed: 03/22/2020 4:10:15 PM By: Lenda Kelp PA-C Signed: 03/22/2020 5:29:15 PM By: [name] RN, BSN Entered By: [name] on 03/22/2020 16:02:55 -------------------------------------------------------------------------------- HPI Details Patient Name: Date of Service: [name], [name] DA 03/22/2020 3:30 PM Medical Record Number: [id number] Patient Account Number: [id number] Date of Birth/Sex: Treating RN: Oct 21, 1960 (59 y.o. [name] Primary Care Provider: Theodis Shove Other Clinician: Referring Provider: Treating Provider/Extender: [name] in Treatment: 5 History of Present Illness HPI Description: 02/16/2020 upon evaluation today patient actually appears to be doing poorly in regard to her left medial lower extremity ulcer. This is actually an area that she tells me she has had intermittent issues with over the years although has been closed for some time she typically uses compression right now she has juxta lite compression wraps. With that being said she tells me that this nonetheless open several weeks/months ago and has been given her trouble since. She does have a history of chronic venous insufficiency she is seeing specialist for this in the past she has had an ablation as well as sclerotherapy. With that being said she also has hypertension chronically which is managed by her primary care provider. In general she seems to be worsening overall with regard to the wound and states that she finally realized that she needed to come in and have somebody look at this and not continue  to try to manage this on her own. No fevers, chills, nausea, vomiting, or diarrhea. 02/23/2020 on evaluation today patient appears to be doing well with regard to her wound. This is showing some signs of improvement which is great news still were not quite at the point where I would like to be as far as the overall appearance  of the wound is concerned but I do believe this is better than last week. I do believe the Iodoflex is helping as well. 03/08/2020 upon evaluation today patient appears to be doing well with regard to her wound. She has been tolerating the dressing changes without complication. Fortunately I feel like she has made great progress with the Iodoflex but I feel like it may be the point rest to switch to something else possibly a collagen- based dressing at this time. 03/15/2020 upon evaluation today patient appears to be doing excellent in regard to her leg ulcer. She has been tolerating the dressing changes without complication. Fortunately there is no signs of active infection. Overall she is measuring a little bit smaller today which is great news. 03/22/2020 upon evaluation today patient appears to be doing well with regard to her wound. She has been tolerating the dressing changes without complication. Fortunately there is no signs of active infection at this time. Electronic Signature(s) Signed: 03/22/2020 4:07:20 PM By: Lenda Kelp PA-C Entered By: Lenda Kelp on 03/22/2020 16:07:20 -------------------------------------------------------------------------------- Physical Exam Details Patient Name: Date of Service: [name] DA 03/22/2020 3:30 PM Medical Record Number: [id number] Patient Account Number: [id number] Date of Birth/Sex: Treating RN: [birth year] (59 y.o. [name] Primary Care Provider: Theodis Shove Other Clinician: Referring Provider: Treating Provider/Extender: [name] in Treatment:  5 Constitutional Well-nourished and well-hydrated in no acute distress. Respiratory normal breathing without difficulty. Psychiatric this patient is able to make decisions and demonstrates good insight into disease process. Alert and Oriented x 3. pleasant and cooperative. Notes Patient's wound bed currently showed signs of excellent granulation and epithelization there was some slough noted on the surface of the wound this did require sharp debridement there was some necrotic debris she tolerated that today without complication post debridement the wound bed appears to be doing much better. Electronic Signature(s) Signed: 03/22/2020 4:07:35 PM By: Lenda Kelp PA-C Entered By: Lenda Kelp on 03/22/2020 16:07:34 -------------------------------------------------------------------------------- Physician Orders Details Patient Name: Date of Service: [place], [name] DA 03/22/2020 3:30 PM Medical Record Number: [id number] Patient Account Number: [id number] Date of Birth/Sex: Treating RN: [birth year] (60 y.o. [name] Primary Care Provider: Theodis Shove Other Clinician: Referring Provider: Treating Provider/Extender: [name] in Treatment: 5 Verbal / Phone Orders: No Diagnosis Coding ICD-10 Coding Code Description I87.2 Venous insufficiency (chronic) (peripheral) L97.822 Non-pressure chronic ulcer of other part of left lower leg with fat layer exposed I10 Essential (primary) hypertension Follow-up Appointments Return Appointment in 1 week. Bathing/ Shower/ Hygiene May shower with protection but do not get wound dressing(s) wet. Edema Control - Lymphedema / SCD / Other Bilateral Lower Extremities Elevate legs to the level of the heart or above for 30 minutes daily and/or when sitting, a frequency of: Avoid standing for long periods of time. Exercise regularly Wound Treatment Wound #1 - Lower Leg Wound Laterality: Left,  Medial Peri-Wound Care: Ketoconazole Cream 2% 1 x Per Week Discharge Instructions: Apply Ketoconazole mix with TCA cream Peri-Wound Care: Triamcinolone 15 (g) 1 x Per Week Discharge Instructions: Use triamcinolone 15 (g) mix with ketoconazole to rash medial ankle Peri-Wound Care: Sween Lotion (Moisturizing lotion) 1 x Per Week Discharge Instructions: Apply moisturizing lotion to leg Prim Dressing: Promogran Prisma Matrix, 4.34 (sq in) (silver collagen) 1 x Per Week ary Discharge Instructions: Moisten collagen with saline or hydrogel Secondary Dressing: Woven Gauze Sponge, Non-Sterile 4x4 in 1 x Per Week Discharge Instructions:  Apply over primary dressing as directed. Secondary Dressing: Drawtex 4x4 in 1 x Per Week Discharge Instructions: Apply over primary dressing cut to fit inside wound margins to hold collagen in place Compression Wrap: ThreePress (3 layer compression wrap) 1 x Per Week Discharge Instructions: Apply three layer compression as directed. Electronic Signature(s) Signed: 03/22/2020 4:10:15 PM By: Lenda Kelp PA-C Signed: 03/22/2020 5:29:15 PM By: [name] RN, BSN Entered By: [name] on 03/22/2020 16:07:08 -------------------------------------------------------------------------------- Problem List Details Patient Name: Date of Service: [name], [name] DA 03/22/2020 3:30 PM Medical Record Number: [id number] Patient Account Number: [id number] Date of Birth/Sex: Treating RN: [birth year] (59 y.o. [name] Primary Care Provider: Theodis Shove Other Clinician: Referring Provider: Treating Provider/Extender: [name] in Treatment: 5 Active Problems ICD-10 Encounter Code Description Active Date MDM Diagnosis I87.2 Venous insufficiency (chronic) (peripheral) 02/16/2020 No Yes L97.822 Non-pressure chronic ulcer of other part of left lower leg with fat layer 02/16/2020 No Yes exposed I10 Essential (primary)  hypertension 02/16/2020 No Yes Inactive Problems Resolved Problems Electronic Signature(s) Signed: 03/22/2020 3:24:34 PM By: Lenda Kelp PA-C Entered By: Lenda Kelp on 03/22/2020 15:24:34 -------------------------------------------------------------------------------- Progress Note Details Patient Name: Date of Service: [place], [name] DA 03/22/2020 3:30 PM Medical Record Number: [id number] Patient Account Number: [id number] Date of Birth/Sex: Treating RN: Mar 07, 1961 (59 y.o. [name] Primary Care Provider: Theodis Shove Other Clinician: Referring Provider: Treating Provider/Extender: [name] in Treatment: 5 Subjective Chief Complaint Information obtained from Patient Left LE Ulcer History of Present Illness (HPI) 02/16/2020 upon evaluation today patient actually appears to be doing poorly in regard to her left medial lower extremity ulcer. This is actually an area that she tells me she has had intermittent issues with over the years although has been closed for some time she typically uses compression right now she has juxta lite compression wraps. With that being said she tells me that this nonetheless open several weeks/months ago and has been given her trouble since. She does have a history of chronic venous insufficiency she is seeing specialist for this in the past she has had an ablation as well as sclerotherapy. With that being said she also has hypertension chronically which is managed by her primary care provider. In general she seems to be worsening overall with regard to the wound and states that she finally realized that she needed to come in and have somebody look at this and not continue to try to manage this on her own. No fevers, chills, nausea, vomiting, or diarrhea. 02/23/2020 on evaluation today patient appears to be doing well with regard to her wound. This is showing some signs of improvement which is great  news still were not quite at the point where I would like to be as far as the overall appearance of the wound is concerned but I do believe this is better than last week. I do believe the Iodoflex is helping as well. 03/08/2020 upon evaluation today patient appears to be doing well with regard to her wound. She has been tolerating the dressing changes without complication. Fortunately I feel like she has made great progress with the Iodoflex but I feel like it may be the point rest to switch to something else possibly a collagen- based dressing at this time. 03/15/2020 upon evaluation today patient appears to be doing excellent in regard to her leg ulcer. She has been tolerating the dressing changes without complication. Fortunately there is no signs  of active infection. Overall she is measuring a little bit smaller today which is great news. 03/22/2020 upon evaluation today patient appears to be doing well with regard to her wound. She has been tolerating the dressing changes without complication. Fortunately there is no signs of active infection at this time. Objective Constitutional Well-nourished and well-hydrated in no acute distress. Vitals Time Taken: 3:32 PM, Height: 64 in, Weight: 278 lbs, BMI: 47.7, Temperature: 98.2 F, Pulse: 92 bpm, Respiratory Rate: 18 breaths/min, Blood Pressure: 133/80 mmHg. Respiratory normal breathing without difficulty. Psychiatric this patient is able to make decisions and demonstrates good insight into disease process. Alert and Oriented x 3. pleasant and cooperative. General Notes: Patient's wound bed currently showed signs of excellent granulation and epithelization there was some slough noted on the surface of the wound this did require sharp debridement there was some necrotic debris she tolerated that today without complication post debridement the wound bed appears to be doing much better. Integumentary (Hair, Skin) Wound #1 status is Open. Original  cause of wound was Gradually Appeared. The wound is located on the Left,Medial Lower Leg. The wound measures 2.5cm length x 1cm width x 0.2cm depth; 1.963cm^2 area and 0.393cm^3 volume. There is Fat Layer (Subcutaneous Tissue) exposed. There is no tunneling or undermining noted. There is a medium amount of serosanguineous drainage noted. The wound margin is distinct with the outline attached to the wound base. There is medium (34-66%) pink, pale granulation within the wound bed. There is a medium (34-66%) amount of necrotic tissue within the wound bed including Adherent Slough. Assessment Active Problems ICD-10 Venous insufficiency (chronic) (peripheral) Non-pressure chronic ulcer of other part of left lower leg with fat layer exposed Essential (primary) hypertension Procedures Wound #1 Pre-procedure diagnosis of Wound #1 is a Venous Leg Ulcer located on the Left,Medial Lower Leg .Severity of Tissue Pre Debridement is: Fat layer exposed. There was a Excisional Skin/Subcutaneous Tissue Debridement with a total area of 2.5 sq cm performed by Lenda Kelp, PA. With the following instrument(s): Curette to remove Viable and Non-Viable tissue/material. Material removed includes Subcutaneous Tissue and Slough and after achieving pain control using Lidocaine 5% topical ointment. No specimens were taken. A time out was conducted at 15:55, prior to the start of the procedure. A Minimum amount of bleeding was controlled with Pressure. The procedure was tolerated well with a pain level of 0 throughout and a pain level of 0 following the procedure. Post Debridement Measurements: 2.5cm length x 1cm width x 0.2cm depth; 0.393cm^3 volume. Character of Wound/Ulcer Post Debridement is improved. Severity of Tissue Post Debridement is: Fat layer exposed. Post procedure Diagnosis Wound #1: Same as Pre-Procedure Pre-procedure diagnosis of Wound #1 is a Venous Leg Ulcer located on the Left,Medial Lower Leg . There  was a Three Layer Compression Therapy Procedure by Yevonne Pax, RN. Post procedure Diagnosis Wound #1: Same as Pre-Procedure Plan Follow-up Appointments: Return Appointment in 1 week. Bathing/ Shower/ Hygiene: May shower with protection but do not get wound dressing(s) wet. Edema Control - Lymphedema / SCD / Other: Elevate legs to the level of the heart or above for 30 minutes daily and/or when sitting, a frequency of: Avoid standing for long periods of time. Exercise regularly WOUND #1: - Lower Leg Wound Laterality: Left, Medial Peri-Wound Care: Ketoconazole Cream 2% 1 x Per Week/ Discharge Instructions: Apply Ketoconazole mix with TCA cream Peri-Wound Care: Triamcinolone 15 (g) 1 x Per Week/ Discharge Instructions: Use triamcinolone 15 (g) mix with ketoconazole to rash  medial ankle Peri-Wound Care: Sween Lotion (Moisturizing lotion) 1 x Per Week/ Discharge Instructions: Apply moisturizing lotion to leg Prim Dressing: Promogran Prisma Matrix, 4.34 (sq in) (silver collagen) 1 x Per Week/ ary Discharge Instructions: Moisten collagen with saline or hydrogel Secondary Dressing: Woven Gauze Sponge, Non-Sterile 4x4 in 1 x Per Week/ Discharge Instructions: Apply over primary dressing as directed. Secondary Dressing: Drawtex 4x4 in 1 x Per Week/ Discharge Instructions: Apply over primary dressing cut to fit inside wound margins to hold collagen in place Com pression Wrap: ThreePress (3 layer compression wrap) 1 x Per Week/ Discharge Instructions: Apply three layer compression as directed. 1. I would recommend currently that we have the patient going continue with the wound care measures as before specifically with regard to the collagen I think this is doing an awesome job. 2. We will continue with the drawtex and behind. 3. Also can recommend that we continue with the 3 layer compression wrap that seems to be doing a great job of keeping her edema under control. 4. We will use a little bit  of triamcinolone along with ketoconazole over the ankle area where she is having some itching this could be a little bit of a fungal type infection but at least the steroid should help with itching at this point as well while the ketoconazole treats any possibility of fungal infection. We will see patient back for reevaluation in 1 week here in the clinic. If anything worsens or changes patient will contact our office for additional recommendations. Electronic Signature(s) Signed: 03/22/2020 4:08:21 PM By: Lenda KelpStone III, Kaulder Zahner PA-C Entered By: Lenda KelpStone III, Jolea Dolle on 03/22/2020 16:08:20 -------------------------------------------------------------------------------- SuperBill Details Patient Name: [name] 03/22/2020 Medical Record Number: [id number] Patient Account Number: [id number] Date of Birth/Sex: Treating RN: 02/05/1961 (59 y.o. [name]) Boehlein, [name] Primary Care Provider: Theodis ShoveKoberlein, Junell Other Clinician: Referring Provider: Treating Provider/Extender: Esaw DaceStone III, Yuktha Kerchner Kober[place], Junell Weeks in Treatment: 5 Diagnosis Coding ICD-10 Codes Code Description I87.2 Venous insufficiency (chronic) (peripheral) L97.822 Non-pressure chronic ulcer of other part of left lower leg with fat layer exposed I10 Essential (primary) hypertension Facility Procedures CPT4 Code: [id number] Description: 11042 - DEB SUBQ TISSUE 20 SQ CM/< ICD-10 Diagnosis Description L97.822 Non-pressure chronic ulcer of other part of left lower leg with fat layer expo Modifier: sed Quantity: 1 Physician Procedures : CPT4 Code Description Modifier [id number] 11042 - WC PHYS SUBQ TISS 20 SQ CM ICD-10 Diagnosis Description L97.822 Non-pressure chronic ulcer of other part of left lower leg with fat layer exposed Quantity: 1 Electronic Signature(s) Signed: 03/22/2020 4:08:30 PM By: Lenda KelpStone III, Rayleigh Gillyard PA-C Entered By: Lenda KelpStone III, Ruby Logiudice on 03/22/2020 16:08:29

## 2020-03-23 NOTE — Progress Notes (Signed)
[name], [name] ([id number]) Visit Report for 03/22/2020 Arrival Information Details Patient Name: Date of Service: [place], [place] 03/22/2020 3:30 PM Medical Record Number: [id number] Patient Account Number: [id number] Date of Birth/Sex: Treating RN: [birth year] (59 y.o. Martyn Malay, [name] Primary Care Sheridyn Canino: Micheline Rough Other Clinician: Referring Pearlie Nies: Treating Charlsie Fleeger/Extender: Vaughan Basta Weeks in Treatment: 5 Visit Information History Since Last Visit Added or deleted any medications: No Patient Arrived: Ambulatory Any new allergies or adverse reactions: No Arrival Time: 15:32 Had a fall or experienced change in No Accompanied By: self activities of daily living that may affect Transfer Assistance: None risk of falls: Patient Identification Verified: Yes Signs or symptoms of abuse/neglect since last visito No Secondary Verification Process Completed: Yes Hospitalized since last visit: No Patient Requires Transmission-Based Precautions: No Implantable device outside of the clinic excluding No Patient Has Alerts: Yes cellular tissue based products placed in the center Patient Alerts: ABI noncompressible since last visit: Has Dressing in Place as Prescribed: Yes Pain Present Now: No Electronic Signature(s) Signed: 03/23/2020 7:52:14 AM By: Sandre Kitty Entered By: Sandre Kitty on 03/22/2020 15:32:28 -------------------------------------------------------------------------------- Compression Therapy Details Patient Name: Date of Service: [name], Spero Curb DA 03/22/2020 3:30 PM Medical Record Number: [id number] Patient Account Number: [id number] Date of Birth/Sex: Treating RN: [birth year] (59 y.o. [name] Primary Care Montel Vanderhoof: Micheline Rough Other Clinician: Referring Keyah Blizard: Treating Gaetano Romberger/Extender: Vaughan Basta Weeks in Treatment: 5 Compression Therapy Performed for Wound Assessment: Wound  #1 Left,Medial Lower Leg Performed By: Clinician Carlene Coria, RN Compression Type: Three Layer Post Procedure Diagnosis Same as Pre-procedure Electronic Signature(s) Signed: 03/22/2020 5:29:15 PM By: Baruch Gouty RN, BSN Entered By: Baruch Gouty on 03/22/2020 16:03:18 -------------------------------------------------------------------------------- Encounter Discharge Information Details Patient Name: Date of Service: [name], [place] 03/22/2020 3:30 PM Medical Record Number: [id number] Patient Account Number: [id number] Date of Birth/Sex: Treating RN: 1960-11-20 (59 y.o. Orvan Falconer Primary Care Sinahi Knights: Micheline Rough Other Clinician: Referring Soma Lizak: Treating Terris Bodin/Extender: Vaughan Basta Weeks in Treatment: 5 Encounter Discharge Information Items Post Procedure Vitals Discharge Condition: Stable Temperature (F): 98.2 Ambulatory Status: Ambulatory Pulse (bpm): 92 Discharge Destination: Home Respiratory Rate (breaths/min): 18 Transportation: Private Auto Blood Pressure (mmHg): 133/80 Accompanied By: self Schedule Follow-up Appointment: Yes Clinical Summary of Care: Patient Declined Electronic Signature(s) Signed: 03/22/2020 5:05:31 PM By: Carlene Coria RN Entered By: Carlene Coria on 03/22/2020 16:27:28 -------------------------------------------------------------------------------- Lower Extremity Assessment Details Patient Name: Date of Service: [place] DA 03/22/2020 3:30 PM Medical Record Number: [id number] Patient Account Number: [id number] Date of Birth/Sex: Treating RN: 1961-04-07 (59 y.o. [name], [name] Primary Care Syler Norcia: Micheline Rough Other Clinician: Referring Donnie Panik: Treating Evanna Washinton/Extender: Vaughan Basta Weeks in Treatment: 5 Edema Assessment Assessed: [Left: No] [Right: No] Edema: [Left: Ye] [Right: s] Calf Left: Right: Point of Measurement: 35 cm From Medial Instep  44.5 cm Ankle Left: Right: Point of Measurement: 9 cm From Medial Instep 23.5 cm Vascular Assessment Pulses: Dorsalis Pedis Palpable: [Left:Yes] Posterior Tibial Palpable: [Left:Yes] Electronic Signature(s) Signed: 03/22/2020 5:01:40 PM By: Rhae Hammock RN Entered By: Rhae Hammock on 03/22/2020 15:41:53 -------------------------------------------------------------------------------- [place] Details Patient Name: Date of Service: [name], [place] 03/22/2020 3:30 PM Medical Record Number: [id number] Patient Account Number: [id number] Date of Birth/Sex: Treating RN: 12-08-1960 (59 y.o. [name] Primary Care Shawana Knoch: Micheline Rough Other Clinician: Referring Vedha Tercero: Treating Fuller Makin/Extender: Vaughan Basta Weeks in Treatment: 5 Active Inactive Venous Leg Ulcer Nursing Diagnoses: Knowledge deficit  related to disease process and management Potential for venous Insuffiency (use before diagnosis confirmed) Goals: Patient will maintain optimal edema control Date Initiated: 02/16/2020 Target Resolution Date: 04/19/2020 Goal Status: Active Interventions: Assess peripheral edema status every visit. Compression as ordered Provide education on venous insufficiency Treatment Activities: Therapeutic compression applied : 02/16/2020 Notes: Wound/Skin Impairment Nursing Diagnoses: Impaired tissue integrity Knowledge deficit related to ulceration/compromised skin integrity Goals: Patient/caregiver will verbalize understanding of skin care regimen Date Initiated: 02/16/2020 Target Resolution Date: 04/19/2020 Goal Status: Active Ulcer/skin breakdown will have a volume reduction of 30% by week 4 Date Initiated: 02/16/2020 Date Inactivated: 03/22/2020 Target Resolution Date: 03/15/2020 Goal Status: Met Ulcer/skin breakdown will have a volume reduction of 50% by week 8 Date Initiated: 03/22/2020 Target Resolution  Date: 04/19/2020 Goal Status: Active Interventions: Assess patient/caregiver ability to obtain necessary supplies Assess patient/caregiver ability to perform ulcer/skin care regimen upon admission and as needed Assess ulceration(s) every visit Provide education on ulcer and skin care Treatment Activities: Skin care regimen initiated : 02/16/2020 Topical wound management initiated : 02/16/2020 Notes: Electronic Signature(s) Signed: 03/22/2020 5:29:15 PM By: Baruch Gouty RN, BSN Entered By: Baruch Gouty on 03/22/2020 15:59:59 -------------------------------------------------------------------------------- Pain Assessment Details Patient Name: Date of Service: [name], [place] 03/22/2020 3:30 PM Medical Record Number: [id number] Patient Account Number: [id number] Date of Birth/Sex: Treating RN: 1960-10-15 (60 y.o. [name] Primary Care Hamid Brookens: Micheline Rough Other Clinician: Referring Sundai Probert: Treating Virna Livengood/Extender: Vaughan Basta Weeks in Treatment: 5 Active Problems Location of Pain Severity and Description of Pain Patient Has Paino No Site Locations Pain Management and Medication Current Pain Management: Electronic Signature(s) Signed: 03/22/2020 5:29:15 PM By: Baruch Gouty RN, BSN Signed: 03/23/2020 7:52:14 AM By: Sandre Kitty Entered By: Sandre Kitty on 03/22/2020 15:32:50 -------------------------------------------------------------------------------- Patient/Caregiver Education Details Patient Name: Date of Service: [name] 12/15/2021andnbsp3:30 PM Medical Record Number: [id number] Patient Account Number: [id number] Date of Birth/Gender: Treating RN: Jul 20, 1960 (59 y.o. [name] Primary Care Physician: Micheline Rough Other Clinician: Referring Physician: Treating Physician/Extender: Stevie Kern in Treatment: 5 Education Assessment Education Provided  To: Patient Education Topics Provided Venous: Methods: Explain/Verbal Responses: Reinforcements needed, State content correctly Wound/Skin Impairment: Methods: Explain/Verbal Responses: Reinforcements needed, State content correctly Electronic Signature(s) Signed: 03/22/2020 5:29:15 PM By: Baruch Gouty RN, BSN Entered By: Baruch Gouty on 03/22/2020 16:00:41 -------------------------------------------------------------------------------- Wound Assessment Details Patient Name: Date of Service: [name], [place] 03/22/2020 3:30 PM Medical Record Number: [id number] Patient Account Number: [id number] Date of Birth/Sex: Treating RN: 12-06-1960 (59 y.o. [name], [name] Primary Care Isrrael Fluckiger: Micheline Rough Other Clinician: Referring Madalene Mickler: Treating Reham Slabaugh/Extender: Vaughan Basta Weeks in Treatment: 5 Wound Status Wound Number: 1 Primary Venous Leg Ulcer Etiology: Wound Location: Left, Medial Lower Leg Wound Status: Open Wounding Event: Gradually Appeared Comorbid Anemia, Sleep Apnea, Hypertension, Peripheral Venous Date Acquired: 01/26/2020 History: Disease Weeks Of Treatment: 5 Clustered Wound: No Wound Measurements Length: (cm) 2.5 Width: (cm) 1 Depth: (cm) 0.2 Area: (cm) 1.963 Volume: (cm) 0.393 % Reduction in Area: 41.7% % Reduction in Volume: 61.1% Epithelialization: Small (1-33%) Tunneling: No Undermining: No Wound Description Classification: Full Thickness Without Exposed Support Structures Wound Margin: Distinct, outline attached Exudate Amount: Medium Exudate Type: Serosanguineous Exudate Color: red, brown Foul Odor After Cleansing: No Slough/Fibrino Yes Wound Bed Granulation Amount: Medium (34-66%) Exposed Structure Granulation Quality: Pink, Pale Fascia Exposed: No Necrotic Amount: Medium (34-66%) Fat Layer (Subcutaneous Tissue) Exposed: Yes Necrotic Quality: Adherent Slough Tendon Exposed: No Muscle Exposed:  No Joint Exposed: No Bone Exposed: No Treatment Notes Wound #1 (Lower Leg) Wound Laterality: Left, Medial Cleanser Peri-Wound Care Ketoconazole Cream 2% Discharge Instruction: Apply Ketoconazole mix with TCA cream Triamcinolone 15 (g) Discharge Instruction: Use triamcinolone 15 (g) mix with ketoconazole to rash medial ankle Sween Lotion (Moisturizing lotion) Discharge Instruction: Apply moisturizing lotion to leg Topical Primary Dressing Promogran Prisma Matrix, 4.34 (sq in) (silver collagen) Discharge Instruction: Moisten collagen with saline or hydrogel Secondary Dressing Woven Gauze Sponge, Non-Sterile 4x4 in Discharge Instruction: Apply over primary dressing as directed. Drawtex 4x4 in Discharge Instruction: Apply over primary dressing cut to fit inside wound margins to hold collagen in place Secured With Compression Wrap ThreePress (3 layer compression wrap) Discharge Instruction: Apply three layer compression as directed. Compression Stockings Add-Ons Electronic Signature(s) Signed: 03/22/2020 5:01:40 PM By: Rhae Hammock RN Entered By: Rhae Hammock on 03/22/2020 15:42:13 -------------------------------------------------------------------------------- Vitals Details Patient Name: Date of Service: [name], [place] 03/22/2020 3:30 PM Medical Record Number: [id number] Patient Account Number: [id number] Date of Birth/Sex: Treating RN: [birth year] (59 y.o. [name] Primary Care Jaiven Graveline: Micheline Rough Other Clinician: Referring Rosalynn Sergent: Treating Cassiopeia Florentino/Extender: Vaughan Basta Weeks in Treatment: 5 Vital Signs Time Taken: 15:32 Temperature (F): 98.2 Height (in): 64 Pulse (bpm): 92 Weight (lbs): 278 Respiratory Rate (breaths/min): 18 Body Mass Index (BMI): 47.7 Blood Pressure (mmHg): 133/80 Reference Range: 80 - 120 mg / dl Electronic Signature(s) Signed: 03/23/2020 7:52:14 AM By: Sandre Kitty Entered By:  Sandre Kitty on 03/22/2020 15:32:44

## 2020-03-28 ENCOUNTER — Other Ambulatory Visit: Payer: Self-pay

## 2020-03-28 ENCOUNTER — Encounter (HOSPITAL_BASED_OUTPATIENT_CLINIC_OR_DEPARTMENT_OTHER): Payer: BC Managed Care – PPO | Admitting: Internal Medicine

## 2020-03-28 DIAGNOSIS — I872 Venous insufficiency (chronic) (peripheral): Secondary | ICD-10-CM | POA: Diagnosis not present

## 2020-03-29 ENCOUNTER — Encounter (HOSPITAL_BASED_OUTPATIENT_CLINIC_OR_DEPARTMENT_OTHER): Payer: BC Managed Care – PPO | Admitting: Physician Assistant

## 2020-03-29 ENCOUNTER — Ambulatory Visit: Payer: BC Managed Care – PPO | Admitting: Family Medicine

## 2020-03-29 NOTE — Progress Notes (Signed)
[name], [name] ([id number]) Visit Report for 03/28/2020 Arrival Information Details Patient Name: Date of Service: [place] DA 03/28/2020 8:00 A M Medical Record Number: [id number] Patient Account Number: [id number] Date of Birth/Sex: Treating RN: 1960-11-13 (59 y.o. [name], Tammi Klippel Primary Care Orry Sigl: Micheline Rough Other Clinician: Referring Duard Spiewak: Treating Keegen Heffern/Extender: Mancel Bale in Treatment: 5 Visit Information History Since Last Visit Added or deleted any medications: No Patient Arrived: Ambulatory Any new allergies or adverse reactions: No Arrival Time: 08:08 Had a fall or experienced change in No Accompanied By: self activities of daily living that may affect Transfer Assistance: None risk of falls: Patient Identification Verified: Yes Signs or symptoms of abuse/neglect since last visito No Secondary Verification Process Completed: Yes Hospitalized since last visit: No Patient Requires Transmission-Based Precautions: No Implantable device outside of the clinic excluding No Patient Has Alerts: Yes cellular tissue based products placed in the center Patient Alerts: ABI noncompressible since last visit: Has Dressing in Place as Prescribed: Yes Has Compression in Place as Prescribed: Yes Pain Present Now: No Electronic Signature(s) Signed: 03/28/2020 5:55:39 PM By: Deon Pilling Entered By: Deon Pilling on 03/28/2020 08:11:39 -------------------------------------------------------------------------------- Compression Therapy Details Patient Name: Date of Service: [name] DA 03/28/2020 8:00 A M Medical Record Number: [id number] Patient Account Number: [id number] Date of Birth/Sex: Treating RN: 31-Jan-1961 (59 y.o. [name] Primary Care Hanni Milford: Micheline Rough Other Clinician: Referring Gianelle Mccaul: Treating Lavonne Kinderman/Extender: Mancel Bale in Treatment: 5 Compression  Therapy Performed for Wound Assessment: Wound #1 Left,Medial Lower Leg Performed By: Clinician Rhae Hammock, RN Compression Type: Three Layer Post Procedure Diagnosis Same as Pre-procedure Electronic Signature(s) Signed: 03/29/2020 5:16:00 PM By: Rhae Hammock RN Entered By: Rhae Hammock on 03/28/2020 09:35:13 -------------------------------------------------------------------------------- Encounter Discharge Information Details Patient Name: Date of Service: [name], [name] DA 03/28/2020 8:00 A M Medical Record Number: [id number] Patient Account Number: [id number] Date of Birth/Sex: Treating RN: 1960/06/05 (59 y.o. [name] Primary Care Exzavier Ruderman: Micheline Rough Other Clinician: Referring Nyelle Wolfson: Treating Chyrl Elwell/Extender: Mancel Bale in Treatment: 5 Encounter Discharge Information Items Post Procedure Vitals Discharge Condition: Stable Temperature (F): 98.5 Ambulatory Status: Ambulatory Pulse (bpm): 103 Discharge Destination: Home Respiratory Rate (breaths/min): 16 Transportation: Private Auto Blood Pressure (mmHg): 150/90 Accompanied By: self Schedule Follow-up Appointment: Yes Clinical Summary of Care: Electronic Signature(s) Signed: 03/28/2020 5:55:39 PM By: Deon Pilling Entered By: Deon Pilling on 03/28/2020 08:56:51 -------------------------------------------------------------------------------- Lower Extremity Assessment Details Patient Name: Date of Service: [name] DA 03/28/2020 8:00 A M Medical Record Number: [id number] Patient Account Number: [id number] Date of Birth/Sex: Treating RN: 21-Jun-1960 (59 y.o. [name] Primary Care Hiran Leard: Micheline Rough Other Clinician: Referring Caridad Silveira: Treating Keagon Glascoe/Extender: Mancel Bale in Treatment: 5 Edema Assessment Assessed: [Left: Yes] [Right: No] Edema: [Left: Ye] [Right: s] Calf Left: Right: Point of  Measurement: 35 cm From Medial Instep 44 cm Ankle Left: Right: Point of Measurement: 9 cm From Medial Instep 22.5 cm Vascular Assessment Pulses: Dorsalis Pedis Palpable: [Left:Yes] Electronic Signature(s) Signed: 03/28/2020 5:55:39 PM By: Deon Pilling Entered By: Deon Pilling on 03/28/2020 08:12:48 -------------------------------------------------------------------------------- Multi Wound Chart Details Patient Name: Date of Service: [name], [place]urb DA 03/28/2020 8:00 A M Medical Record Number: [id number] Patient Account Number: [id number] Date of Birth/Sex: Treating RN: [birth year] (59 y.o. [name] Primary Care Jacquese Hackman: Micheline Rough Other Clinician: Referring Jhaniya Briski: Treating Kaytlin Burklow/Extender: Mancel Bale in Treatment: 5 Vital Signs Height(in): 64 Pulse(bpm): 103 Weight(lbs): 585 Blood Pressure(mmHg): 150/90  Body Mass Index(BMI): 48 Temperature(F): 98.5 Respiratory Rate(breaths/min): 16 Photos: [1:No Photos Left, Medial Lower Leg] [N/A:N/A N/A] Wound Location: [1:Gradually Appeared] [N/A:N/A] Wounding Event: [1:Venous Leg Ulcer] [N/A:N/A] Primary Etiology: [1:Anemia, Sleep Apnea, Hypertension, N/A] Comorbid History: [1:Peripheral Venous Disease 01/26/2020] [N/A:N/A] Date Acquired: [1:5] [N/A:N/A] Weeks of Treatment: [1:Open] [N/A:N/A] Wound Status: [1:2.4x1x0.2] [N/A:N/A] Measurements L x W x D (cm) [1:1.885] [N/A:N/A] A (cm) : rea [1:0.377] [N/A:N/A] Volume (cm) : [1:44.00%] [N/A:N/A] % Reduction in A [1:rea: 62.70%] [N/A:N/A] % Reduction in Volume: [1:Full Thickness Without Exposed] [N/A:N/A] Classification: [1:Support Structures Medium] [N/A:N/A] Exudate A mount: [1:Serosanguineous] [N/A:N/A] Exudate Type: [1:red, brown] [N/A:N/A] Exudate Color: [1:Distinct, outline attached] [N/A:N/A] Wound Margin: [1:Medium (34-66%)] [N/A:N/A] Granulation A mount: [1:Pink, Pale] [N/A:N/A] Granulation Quality: [1:Medium  (34-66%)] [N/A:N/A] Necrotic A mount: [1:Fat Layer (Subcutaneous Tissue): Yes N/A] Exposed Structures: [1:Fascia: No Tendon: No Muscle: No Joint: No Bone: No Small (1-33%)] [N/A:N/A] Epithelialization: [1:Debridement - Excisional] [N/A:N/A] Debridement: Pre-procedure Verification/Time Out 08:47 [N/A:N/A] Taken: [1:Other] [N/A:N/A] Pain Control: [1:Subcutaneous, Slough] [N/A:N/A] Tissue Debrided: [1:Skin/Subcutaneous Tissue] [N/A:N/A] Level: [1:2.4] [N/A:N/A] Debridement A (sq cm): [1:rea Curette] [N/A:N/A] Instrument: [1:Minimum] [N/A:N/A] Bleeding: [1:Pressure] [N/A:N/A] Hemostasis A chieved: [1:0] [N/A:N/A] Procedural Pain: [1:0] [N/A:N/A] Post Procedural Pain: [1:Procedure was tolerated well] [N/A:N/A] Debridement Treatment Response: [1:2.4x1x0.2] [N/A:N/A] Post Debridement Measurements L x W x D (cm) [1:0.377] [N/A:N/A] Post Debridement Volume: (cm) [1:Debridement] [N/A:N/A] Treatment Notes Electronic Signature(s) Signed: 03/28/2020 5:57:42 PM By: Linton Ham MD Signed: 03/29/2020 5:16:00 PM By: Rhae Hammock RN Entered By: Linton Ham on 03/28/2020 08:53:11 -------------------------------------------------------------------------------- Multi-Disciplinary Care Plan Details Patient Name: Date of Service: [place], [name] DA 03/28/2020 8:00 A M Medical Record Number: [id number] Patient Account Number: [id number] Date of Birth/Sex: Treating RN: 31-Oct-1960 (59 y.o. [name], [name] Primary Care Modine Oppenheimer: Micheline Rough Other Clinician: Referring Tedford Berg: Treating Dalila Arca/Extender: Mancel Bale in Treatment: 5 Active Inactive Venous Leg Ulcer Nursing Diagnoses: Knowledge deficit related to disease process and management Potential for venous Insuffiency (use before diagnosis confirmed) Goals: Patient will maintain optimal edema control Date Initiated: 02/16/2020 Target Resolution Date: 04/19/2020 Goal Status:  Active Interventions: Assess peripheral edema status every visit. Compression as ordered Provide education on venous insufficiency Treatment Activities: Therapeutic compression applied : 02/16/2020 Notes: Wound/Skin Impairment Nursing Diagnoses: Impaired tissue integrity Knowledge deficit related to ulceration/compromised skin integrity Goals: Patient/caregiver will verbalize understanding of skin care regimen Date Initiated: 02/16/2020 Target Resolution Date: 04/19/2020 Goal Status: Active Ulcer/skin breakdown will have a volume reduction of 30% by week 4 Date Initiated: 02/16/2020 Date Inactivated: 03/22/2020 Target Resolution Date: 03/15/2020 Goal Status: Met Ulcer/skin breakdown will have a volume reduction of 50% by week 8 Date Initiated: 03/22/2020 Target Resolution Date: 04/19/2020 Goal Status: Active Interventions: Assess patient/caregiver ability to obtain necessary supplies Assess patient/caregiver ability to perform ulcer/skin care regimen upon admission and as needed Assess ulceration(s) every visit Provide education on ulcer and skin care Treatment Activities: Skin care regimen initiated : 02/16/2020 Topical wound management initiated : 02/16/2020 Notes: Electronic Signature(s) Signed: 03/29/2020 5:16:00 PM By: Rhae Hammock RN Entered By: Rhae Hammock on 03/28/2020 08:51:41 -------------------------------------------------------------------------------- Pain Assessment Details Patient Name: Date of Service: [name], [place] 03/28/2020 8:00 [place] Record Number: [id number] Patient Account Number: [id number] Date of Birth/Sex: Treating RN: 1960-11-29 (59 y.o. [name] Primary Care Cavion Faiola: Micheline Rough Other Clinician: Referring Emmamarie Kluender: Treating Yuniel Blaney/Extender: Mancel Bale in Treatment: 5 Active Problems Location of Pain Severity and Description of Pain Patient Has Paino No Site  Locations Rate the pain. Current  Pain Level: 0 Pain Management and Medication Current Pain Management: Medication: No Cold Application: No Rest: No Massage: No Activity: No T.E.N.S.: No Heat Application: No Leg drop or elevation: No Is the Current Pain Management Adequate: Adequate How does your wound impact your activities of daily livingo Sleep: No Bathing: No Appetite: No Relationship With Others: No Bladder Continence: No Emotions: No Bowel Continence: No Work: No Toileting: No Drive: No Dressing: No Hobbies: No Electronic Signature(s) Signed: 03/28/2020 5:55:39 PM By: Deon Pilling Entered By: Deon Pilling on 03/28/2020 08:12:12 -------------------------------------------------------------------------------- Patient/Caregiver Education Details Patient Name: Date of Service: [name], [name] DA 12/21/2021andnbsp8:00 A M Medical Record Number: [id number] Patient Account Number: [id number] Date of Birth/Gender: Treating RN: 1960-07-16 (59 y.o. [name], [name] Primary Care Physician: Micheline Rough Other Clinician: Referring Physician: Treating Physician/Extender: Mancel Bale in Treatment: 5 Education Assessment Education Provided To: Patient Education Topics Provided Venous: Methods: Explain/Verbal Responses: State content correctly Wound/Skin Impairment: Methods: Explain/Verbal Responses: State content correctly Electronic Signature(s) Signed: 03/29/2020 5:16:00 PM By: Rhae Hammock RN Entered By: Rhae Hammock on 03/28/2020 08:08:03 -------------------------------------------------------------------------------- Wound Assessment Details Patient Name: Date of Service: [name], S[place] C[place] DA 03/28/2020 8:00 A M Medical Record Number: [id number] Patient Account Number: [id number] Date of Birth/Sex: Treating RN: 11-Oct-1960 (59 y.o. [name], Meta.Reding Primary Care Odin Mariani: Micheline Rough Other Clinician: Referring  Zyrell Carmean: Treating Karinna Beadles/Extender: Mancel Bale in Treatment: 5 Wound Status Wound Number: 1 Primary Venous Leg Ulcer Etiology: Wound Location: Left, Medial Lower Leg Wound Status: Open Wounding Event: Gradually Appeared Comorbid Anemia, Sleep Apnea, Hypertension, Peripheral Venous Date Acquired: 01/26/2020 History: Disease Weeks Of Treatment: 5 Clustered Wound: No Wound Measurements Length: (cm) 2.4 Width: (cm) 1 Depth: (cm) 0.2 Area: (cm) 1.885 Volume: (cm) 0.377 % Reduction in Area: 44% % Reduction in Volume: 62.7% Epithelialization: Small (1-33%) Tunneling: No Undermining: No Wound Description Classification: Full Thickness Without Exposed Support Structures Wound Margin: Distinct, outline attached Exudate Amount: Medium Exudate Type: Serosanguineous Exudate Color: red, brown Foul Odor After Cleansing: No Slough/Fibrino Yes Wound Bed Granulation Amount: Medium (34-66%) Exposed Structure Granulation Quality: Pink, Pale Fascia Exposed: No Necrotic Amount: Medium (34-66%) Fat Layer (Subcutaneous Tissue) Exposed: Yes Necrotic Quality: Adherent Slough Tendon Exposed: No Muscle Exposed: No Joint Exposed: No Bone Exposed: No Treatment Notes Wound #1 (Lower Leg) Wound Laterality: Left, Medial Cleanser Peri-Wound Care Ketoconazole Cream 2% Discharge Instruction: Apply Ketoconazole mix with TCA cream Triamcinolone 15 (g) Discharge Instruction: Use triamcinolone 15 (g) mix with ketoconazole to rash medial ankle Sween Lotion (Moisturizing lotion) Discharge Instruction: Apply moisturizing lotion to leg Topical Primary Dressing Promogran Prisma Matrix, 4.34 (sq in) (silver collagen) Discharge Instruction: Moisten collagen with saline or hydrogel Secondary Dressing Woven Gauze Sponge, Non-Sterile 4x4 in Discharge Instruction: Apply over primary dressing as directed. Drawtex 4x4 in Discharge Instruction: Apply over primary dressing  cut to fit inside wound margins to hold collagen in place Secured With Compression Wrap ThreePress (3 layer compression wrap) Discharge Instruction: Apply three layer compression as directed. Compression Stockings Add-Ons Electronic Signature(s) Signed: 03/28/2020 5:55:39 PM By: Deon Pilling Entered By: Deon Pilling on 03/28/2020 08:13:56 -------------------------------------------------------------------------------- Vitals Details Patient Name: Date of Service: [name], [name] DA 03/28/2020 8:00 A M Medical Record Number: [id number] Patient Account Number: [id number] Date of Birth/Sex: Treating RN: Jul 09, 1960 (59 y.o. [name] Primary Care Crosley Stejskal: Micheline Rough Other Clinician: Referring Temika Sutphin: Treating Dirk Vanaman/Extender: Mancel Bale in Treatment: 5 Vital Signs Time Taken: 08:09 Temperature (F): 98.5 Height (in): 64 Pulse (  bpm): 103 Weight (lbs): 278 Respiratory Rate (breaths/min): 16 Body Mass Index (BMI): 47.7 Blood Pressure (mmHg): 150/90 Reference Range: 80 - 120 mg / dl Electronic Signature(s) Signed: 03/28/2020 5:55:39 PM By: Deon Pilling Entered By: Deon Pilling on 03/28/2020 08:11:59

## 2020-03-29 NOTE — Progress Notes (Signed)
GWENDY, BOEDER ([id number]) Visit Report for 03/28/2020 Debridement Details Patient Name: Date of Service: [place] DA 03/28/2020 8:00 A M Medical Record Number: [id number] Patient Account Number: [id number] Date of Birth/Sex: Treating RN: 1961/03/18 (59 y.o. Ardis Rowan, [name] Primary Care Provider: Theodis Shove Other Clinician: Referring Provider: Treating Provider/Extender: Virgina Norfolk in Treatment: 5 Debridement Performed for Assessment: Wound #1 Left,Medial Lower Leg Performed By: Physician Maxwell Caul., MD Debridement Type: Debridement Severity of Tissue Pre Debridement: Fat layer exposed Level of Consciousness (Pre-procedure): Awake and Alert Pre-procedure Verification/Time Out Yes - 08:47 Taken: Start Time: 08:47 Pain Control: Other : Benzocaine T Area Debrided (L x W): otal 2.4 (cm) x 1 (cm) = 2.4 (cm) Tissue and other material debrided: Slough, Subcutaneous, Skin: Dermis , Slough Level: Skin/Subcutaneous Tissue Debridement Description: Excisional Instrument: Curette Bleeding: Minimum Hemostasis Achieved: Pressure End Time: 08:48 Procedural Pain: 0 Post Procedural Pain: 0 Response to Treatment: Procedure was tolerated well Level of Consciousness (Post- Awake and Alert procedure): Post Debridement Measurements of Total Wound Length: (cm) 2.4 Width: (cm) 1 Depth: (cm) 0.2 Volume: (cm) 0.377 Character of Wound/Ulcer Post Debridement: Improved Severity of Tissue Post Debridement: Fat layer exposed Post Procedure Diagnosis Same as Pre-procedure Electronic Signature(s) Signed: 03/28/2020 5:57:42 PM By: Baltazar Najjar MD Signed: 03/29/2020 5:16:00 PM By: Fonnie Mu RN Entered By: Baltazar Najjar on 03/28/2020 08:53:20 -------------------------------------------------------------------------------- HPI Details Patient Name: Date of Service: [name], SURA DA 03/28/2020 8:00 A M Medical Record Number:  [id number] Patient Account Number: [id number] Date of Birth/Sex: Treating RN: 11-04-1960 (59 y.o. Ardis Rowan, [name] Primary Care Provider: Theodis Shove Other Clinician: Referring Provider: Treating Provider/Extender: Virgina Norfolk in Treatment: 5 History of Present Illness HPI Description: 02/16/2020 upon evaluation today patient actually appears to be doing poorly in regard to her left medial lower extremity ulcer. This is actually an area that she tells me she has had intermittent issues with over the years although has been closed for some time she typically uses compression right now she has juxta lite compression wraps. With that being said she tells me that this nonetheless open several weeks/months ago and has been given her trouble since. She does have a history of chronic venous insufficiency she is seeing specialist for this in the past she has had an ablation as well as sclerotherapy. With that being said she also has hypertension chronically which is managed by her primary care provider. In general she seems to be worsening overall with regard to the wound and states that she finally realized that she needed to come in and have somebody look at this and not continue to try to manage this on her own. No fevers, chills, nausea, vomiting, or diarrhea. 02/23/2020 on evaluation today patient appears to be doing well with regard to her wound. This is showing some signs of improvement which is great news still were not quite at the point where I would like to be as far as the overall appearance of the wound is concerned but I do believe this is better than last week. I do believe the Iodoflex is helping as well. 03/08/2020 upon evaluation today patient appears to be doing well with regard to her wound. She has been tolerating the dressing changes without complication. Fortunately I feel like she has made great progress with the Iodoflex but I feel like it may  be the point rest to switch to something else possibly a collagen- based dressing at this time. 03/15/2020 upon evaluation  today patient appears to be doing excellent in regard to her leg ulcer. She has been tolerating the dressing changes without complication. Fortunately there is no signs of active infection. Overall she is measuring a little bit smaller today which is great news. 03/22/2020 upon evaluation today patient appears to be doing well with regard to her wound. She has been tolerating the dressing changes without complication. Fortunately there is no signs of active infection at this time. 03/28/2020; patient I do not usually see however she has a wound on the left anterior lower leg secondary to chronic venous insufficiency we have been using silver collagen under compression. She arrives in clinic with a nonviable surface requiring debridement Electronic Signature(s) Signed: 03/28/2020 5:57:42 PM By: Baltazar Najjarobson, Makai Dumond MD Entered By: Baltazar Najjarobson, Delle Andrzejewski on 03/28/2020 08:54:23 -------------------------------------------------------------------------------- Physical Exam Details Patient Name: Date of Service: Teresita [name], Jackie PlumSURA DA 03/28/2020 8:00 A M Medical Record Number: [id number] Patient Account Number: [id number] Date of Birth/Sex: Treating RN: 09/02/1960 (59 y.o. Toniann FailF) Breedlove, [name] Primary Care Provider: Theodis ShoveKoberlein, Junell Other Clinician: Referring Provider: Treating Provider/Extender: Virgina Norfolkobson, Felipa Laroche Koberlein, Junell Weeks in Treatment: 5 Constitutional Patient is hypertensive.. Pulse regular and within target range for patient.Marland Kitchen. Respirations regular, non-labored and within target range.. Temperature is normal and within the target range for the patient.Marland Kitchen. Appears in no distress. Notes Wound exam; patient's wound bed 100% covered with a beige fibrinous debris. She required an aggressive debridement with a #3 curette to remove this at which time the wound cleaned up quite nicely. No  active signs of infection. Hemostasis with direct pressure Electronic Signature(s) Signed: 03/28/2020 5:57:42 PM By: Baltazar Najjarobson, Elsworth Ledin MD Entered By: Baltazar Najjarobson, Othman Masur on 03/28/2020 08:55:37 -------------------------------------------------------------------------------- Physician Orders Details Patient Name: Date of Service: Teresita [name], [name] 03/28/2020 8:00 A M Medical Record Number: [id number] Patient Account Number: [id number] Date of Birth/Sex: Treating RN: 04/05/1961 (59 y.o. Toniann FailF) Breedlove, [name] Primary Care Provider: Theodis ShoveKoberlein, Junell Other Clinician: Referring Provider: Treating Provider/Extender: Virgina Norfolkobson, Laruen Risser Koberlein, Junell Weeks in Treatment: 5 Verbal / Phone Orders: No Diagnosis Coding Follow-up Appointments ppointment in 2 weeks. [facility] Nurse Visit: - in 1 week on Wednesday Bathing/ Shower/ Hygiene May shower with protection but do not get wound dressing(s) wet. Edema Control - Lymphedema / SCD / Other Bilateral Lower Extremities Elevate legs to the level of the heart or above for 30 minutes daily and/or when sitting, a frequency of: Avoid standing for long periods of time. Exercise regularly Wound Treatment Wound #1 - Lower Leg Wound Laterality: Left, Medial Peri-Wound Care: Ketoconazole Cream 2% 1 x Per Week Discharge Instructions: Apply Ketoconazole mix with TCA cream Peri-Wound Care: Triamcinolone 15 (g) 1 x Per Week Discharge Instructions: Use triamcinolone 15 (g) mix with ketoconazole to rash medial ankle Peri-Wound Care: Sween Lotion (Moisturizing lotion) 1 x Per Week Discharge Instructions: Apply moisturizing lotion to leg Prim Dressing: Promogran Prisma Matrix, 4.34 (sq in) (silver collagen) 1 x Per Week ary Discharge Instructions: Moisten collagen with saline or hydrogel Secondary Dressing: Woven Gauze Sponge, Non-Sterile 4x4 in 1 x Per Week Discharge Instructions: Apply over primary dressing as directed. Secondary Dressing: Drawtex  4x4 in 1 x Per Week Discharge Instructions: Apply over primary dressing cut to fit inside wound margins to hold collagen in place Compression Wrap: ThreePress (3 layer compression wrap) 1 x Per Week Discharge Instructions: Apply three layer compression as directed. Electronic Signature(s) Signed: 03/28/2020 5:57:42 PM By: Baltazar Najjarobson, Jeslin Bazinet MD Signed: 03/29/2020 5:16:00 PM By: Fonnie MuBreedlove, [name] RN Entered By: Fonnie MuBreedlove, [name]  on 03/28/2020 08:51:25 -------------------------------------------------------------------------------- Problem List Details Patient Name: Date of Service: [name] DA 03/28/2020 8:00 A M Medical Record Number: [id number] Patient Account Number: [id number] Date of Birth/Sex: Treating RN: [birth year] (59 y.o. Ardis Rowan, [name] Primary Care Provider: Theodis Shove Other Clinician: Referring Provider: Treating Provider/Extender: Virgina Norfolk in Treatment: 5 Active Problems ICD-10 Encounter Code Description Active Date MDM Diagnosis I87.2 Venous insufficiency (chronic) (peripheral) 02/16/2020 No Yes L97.822 Non-pressure chronic ulcer of other part of left lower leg with fat layer 02/16/2020 No Yes exposed I10 Essential (primary) hypertension 02/16/2020 No Yes Inactive Problems Resolved Problems Electronic Signature(s) Signed: 03/28/2020 5:57:42 PM By: Baltazar Najjar MD Entered By: Baltazar Najjar on 03/28/2020 08:53:06 -------------------------------------------------------------------------------- Progress Note Details Patient Name: Date of Service: [name], [name] DA 03/28/2020 8:00 A M Medical Record Number: [id number] Patient Account Number: [id number] Date of Birth/Sex: Treating RN: March 07, 1961 (59 y.o. Ardis Rowan, [name] Primary Care Provider: Theodis Shove Other Clinician: Referring Provider: Treating Provider/Extender: Virgina Norfolk in Treatment: 5 Subjective History of Present  Illness (HPI) 02/16/2020 upon evaluation today patient actually appears to be doing poorly in regard to her left medial lower extremity ulcer. This is actually an area that she tells me she has had intermittent issues with over the years although has been closed for some time she typically uses compression right now she has juxta lite compression wraps. With that being said she tells me that this nonetheless open several weeks/months ago and has been given her trouble since. She does have a history of chronic venous insufficiency she is seeing specialist for this in the past she has had an ablation as well as sclerotherapy. With that being said she also has hypertension chronically which is managed by her primary care provider. In general she seems to be worsening overall with regard to the wound and states that she finally realized that she needed to come in and have somebody look at this and not continue to try to manage this on her own. No fevers, chills, nausea, vomiting, or diarrhea. 02/23/2020 on evaluation today patient appears to be doing well with regard to her wound. This is showing some signs of improvement which is great news still were not quite at the point where I would like to be as far as the overall appearance of the wound is concerned but I do believe this is better than last week. I do believe the Iodoflex is helping as well. 03/08/2020 upon evaluation today patient appears to be doing well with regard to her wound. She has been tolerating the dressing changes without complication. Fortunately I feel like she has made great progress with the Iodoflex but I feel like it may be the point rest to switch to something else possibly a collagen- based dressing at this time. 03/15/2020 upon evaluation today patient appears to be doing excellent in regard to her leg ulcer. She has been tolerating the dressing changes without complication. Fortunately there is no signs of active infection.  Overall she is measuring a little bit smaller today which is great news. 03/22/2020 upon evaluation today patient appears to be doing well with regard to her wound. She has been tolerating the dressing changes without complication. Fortunately there is no signs of active infection at this time. 03/28/2020; patient I do not usually see however she has a wound on the left anterior lower leg secondary to chronic venous insufficiency we have been using silver collagen under compression. She arrives in clinic  with a nonviable surface requiring debridement Objective Constitutional Patient is hypertensive.. Pulse regular and within target range for patient.Marland Kitchen Respirations regular, non-labored and within target range.. Temperature is normal and within the target range for the patient.Marland Kitchen Appears in no distress. Vitals Time Taken: 8:09 AM, Height: 64 in, Weight: 278 lbs, BMI: 47.7, Temperature: 98.5 F, Pulse: 103 bpm, Respiratory Rate: 16 breaths/min, Blood Pressure: 150/90 mmHg. General Notes: Wound exam; patient's wound bed 100% covered with a beige fibrinous debris. She required an aggressive debridement with a #3 curette to remove this at which time the wound cleaned up quite nicely. No active signs of infection. Hemostasis with direct pressure Integumentary (Hair, Skin) Wound #1 status is Open. Original cause of wound was Gradually Appeared. The wound is located on the Left,Medial Lower Leg. The wound measures 2.4cm length x 1cm width x 0.2cm depth; 1.885cm^2 area and 0.377cm^3 volume. There is Fat Layer (Subcutaneous Tissue) exposed. There is no tunneling or undermining noted. There is a medium amount of serosanguineous drainage noted. The wound margin is distinct with the outline attached to the wound base. There is medium (34-66%) pink, pale granulation within the wound bed. There is a medium (34-66%) amount of necrotic tissue within the wound bed including Adherent Slough. Assessment Active  Problems ICD-10 Venous insufficiency (chronic) (peripheral) Non-pressure chronic ulcer of other part of left lower leg with fat layer exposed Essential (primary) hypertension Procedures Wound #1 Pre-procedure diagnosis of Wound #1 is a Venous Leg Ulcer located on the Left,Medial Lower Leg .Severity of Tissue Pre Debridement is: Fat layer exposed. There was a Excisional Skin/Subcutaneous Tissue Debridement with a total area of 2.4 sq cm performed by Maxwell Caul., MD. With the following instrument(s): Curette Material removed includes Subcutaneous Tissue, Slough, and Skin: Dermis after achieving pain control using Other (Benzocaine). No specimens were taken. A time out was conducted at 08:47, prior to the start of the procedure. A Minimum amount of bleeding was controlled with Pressure. The procedure was tolerated well with a pain level of 0 throughout and a pain level of 0 following the procedure. Post Debridement Measurements: 2.4cm length x 1cm width x 0.2cm depth; 0.377cm^3 volume. Character of Wound/Ulcer Post Debridement is improved. Severity of Tissue Post Debridement is: Fat layer exposed. Post procedure Diagnosis Wound #1: Same as Pre-Procedure Plan Follow-up Appointments: Return Appointment in 2 weeks. - for [facility] visit Nurse Visit: - in 1 week on Wednesday Bathing/ Shower/ Hygiene: May shower with protection but do not get wound dressing(s) wet. Edema Control - Lymphedema / SCD / Other: Elevate legs to the level of the heart or above for 30 minutes daily and/or when sitting, a frequency of: Avoid standing for long periods of time. Exercise regularly WOUND #1: - Lower Leg Wound Laterality: Left, Medial Peri-Wound Care: Ketoconazole Cream 2% 1 x Per Week/ Discharge Instructions: Apply Ketoconazole mix with TCA cream Peri-Wound Care: Triamcinolone 15 (g) 1 x Per Week/ Discharge Instructions: Use triamcinolone 15 (g) mix with ketoconazole to rash medial ankle Peri-Wound Care:  Sween Lotion (Moisturizing lotion) 1 x Per Week/ Discharge Instructions: Apply moisturizing lotion to leg Prim Dressing: Promogran Prisma Matrix, 4.34 (sq in) (silver collagen) 1 x Per Week/ ary Discharge Instructions: Moisten collagen with saline or hydrogel Secondary Dressing: Woven Gauze Sponge, Non-Sterile 4x4 in 1 x Per Week/ Discharge Instructions: Apply over primary dressing as directed. Secondary Dressing: Drawtex 4x4 in 1 x Per Week/ Discharge Instructions: Apply over primary dressing cut to fit inside wound margins to hold collagen  in place Com pression Wrap: ThreePress (3 layer compression wrap) 1 x Per Week/ Discharge Instructions: Apply three layer compression as directed. 1. I continued the silver collagen this week however if she arrives with the same fibrinous debris on the surface will need to change the dressing. I note she finished with Iodoflex at the end of the month. May need to put her back on this. 2. Her edema is reasonably well controlled chronic venous insufficiency/lymphedema. She is in 3 layer compression Electronic Signature(s) Signed: 03/28/2020 5:57:42 PM By: Baltazar Najjar MD Entered By: Baltazar Najjar on 03/28/2020 08:56:48 -------------------------------------------------------------------------------- SuperBill Details Patient Name: Date of Service: [name], [name] DA 03/28/2020 Medical Record Number: [id number] Patient Account Number: [id number] Date of Birth/Sex: Treating RN: 1961-03-16 (59 y.o. Ardis Rowan, [name] Primary Care Provider: Theodis Shove Other Clinician: Referring Provider: Treating Provider/Extender: Virgina Norfolk in Treatment: 5 Diagnosis Coding ICD-10 Codes Code Description I87.2 Venous insufficiency (chronic) (peripheral) L97.822 Non-pressure chronic ulcer of other part of left lower leg with fat layer exposed I10 Essential (primary) hypertension Facility Procedures CPT4 Code:  [id number] Description: 11042 - DEB SUBQ TISSUE 20 SQ CM/< ICD-10 Diagnosis Description L97.822 Non-pressure chronic ulcer of other part of left lower leg with fat layer expo Modifier: sed Quantity: 1 Physician Procedures : CPT4 Code Description Modifier [id number] 11042 - WC PHYS SUBQ TISS 20 SQ CM ICD-10 Diagnosis Description L97.822 Non-pressure chronic ulcer of other part of left lower leg with fat layer exposed Quantity: 1 Electronic Signature(s) Signed: 03/28/2020 5:57:42 PM By: Baltazar Najjar MD Entered By: Baltazar Najjar on 03/28/2020 08:57:00

## 2020-03-30 ENCOUNTER — Other Ambulatory Visit: Payer: Self-pay | Admitting: Family Medicine

## 2020-03-30 DIAGNOSIS — Z1231 Encounter for screening mammogram for malignant neoplasm of breast: Secondary | ICD-10-CM

## 2020-04-05 ENCOUNTER — Encounter (HOSPITAL_BASED_OUTPATIENT_CLINIC_OR_DEPARTMENT_OTHER): Payer: BC Managed Care – PPO | Admitting: Physician Assistant

## 2020-04-05 ENCOUNTER — Other Ambulatory Visit: Payer: Self-pay

## 2020-04-05 DIAGNOSIS — I872 Venous insufficiency (chronic) (peripheral): Secondary | ICD-10-CM | POA: Diagnosis not present

## 2020-04-10 NOTE — Progress Notes (Signed)
FRANCESS, MULLEN ([id number]) Visit Report for 04/05/2020 Arrival Information Details Patient Name: Date of Service: [place] DA 04/05/2020 10:00 A M Medical Record Number: [id number] Patient Account Number: [id number] Date of Birth/Sex: Treating RN: July 27, 1960 (60 y.o. [name], [name] Primary Care Sharlette Jansma: Theodis Shove Other Clinician: Referring Sobia Karger: Treating Gabrella Stroh/Extender: [name] in Treatment: 7 Visit Information History Since Last Visit Added or deleted any medications: No Patient Arrived: Ambulatory Any new allergies or adverse reactions: No Arrival Time: 10:06 Had a fall or experienced change in No Accompanied By: self activities of daily living that may affect Transfer Assistance: None risk of falls: Patient Identification Verified: Yes Signs or symptoms of abuse/neglect since last visito No Secondary Verification Process Completed: Yes Hospitalized since last visit: No Patient Requires Transmission-Based Precautions: No Implantable device outside of the clinic excluding No Patient Has Alerts: Yes cellular tissue based products placed in the center Patient Alerts: ABI noncompressible since last visit: Has Dressing in Place as Prescribed: Yes Pain Present Now: No Electronic Signature(s) Signed: 04/05/2020 10:14:31 AM By: Karl Ito Entered By: Karl Ito on 04/05/2020 10:06:54 -------------------------------------------------------------------------------- Compression Therapy Details Patient Name: Date of Service: [name], [name] DA 04/05/2020 10:00 A M Medical Record Number: [id number] Patient Account Number: [id number] Date of Birth/Sex: Treating RN: April 27, 1960 (60 y.o. [name] Primary Care Kirill Chatterjee: Theodis Shove Other Clinician: Referring Troy Hartzog: Treating Kiyaan Haq/Extender: [name] in Treatment: 7 Compression Therapy Performed for Wound Assessment:  Wound #1 Left,Medial Lower Leg Performed By: Clinician [name], RN Compression Type: Three Emergency planning/management officer) Signed: 04/10/2020 2:31:34 PM By: [name] RN, BSN Entered By: [name] on 04/05/2020 10:35:37 -------------------------------------------------------------------------------- Encounter Discharge Information Details Patient Name: Date of Service: [place], [name] DA 04/05/2020 10:00 A M Medical Record Number: [id number] Patient Account Number: [id number] Date of Birth/Sex: Treating RN: [birth year] (60 y.o. [name] Primary Care Miyeko Mahlum: Other Clinician: Theodis Shove Referring Hargun Spurling: Treating Deklen Popelka/Extender: Eliseo Squires in Treatment: 7 Encounter Discharge Information Items Discharge Condition: Stable Ambulatory Status: Ambulatory Discharge Destination: Home Transportation: Private Auto Accompanied By: self Schedule Follow-up Appointment: Yes Clinical Summary of Care: Patient Declined Electronic Signature(s) Signed: 04/10/2020 2:31:34 PM By: [name] RN, BSN Entered By: [name] on 04/05/2020 10:36:20 -------------------------------------------------------------------------------- Patient/Caregiver Education Details Patient Name: Date of Service: [name], [name] DA 12/29/2021andnbsp10:00 A M Medical Record Number: [id number] Patient Account Number: [id number] Date of Birth/Gender: Treating RN: January 20, 1961 (61 y.o. [name] Primary Care Physician: Theodis Shove Other Clinician: Referring Physician: Treating Physician/Extender: Eliseo Squires in Treatment: 7 Education Assessment Education Provided To: Patient Education Topics Provided Venous: Methods: Explain/Verbal Responses: Reinforcements needed, State content correctly Electronic Signature(s) Signed: 04/10/2020 2:31:34 PM By: [name] RN, BSN Entered By: [name] on  04/05/2020 10:36:09 -------------------------------------------------------------------------------- Wound Assessment Details Patient Name: Date of Service: [name], [name] DA 04/05/2020 10:00 A M Medical Record Number: [id number] Patient Account Number: [id number] Date of Birth/Sex: Treating RN: [birth year] (60 y.o. [name] Primary Care Kimanh Templeman: Theodis Shove Other Clinician: Referring Teonia Yager: Treating Alannah Averhart/Extender: [name] in Treatment: 7 Wound Status Wound Number: 1 Primary Venous Leg Ulcer Etiology: Wound Location: Left, Medial Lower Leg Wound Status: Open Wounding Event: Gradually Appeared Comorbid Anemia, Sleep Apnea, Hypertension, Peripheral Venous Date Acquired: 01/26/2020 Date Acquired: 01/26/2020 History: Disease Weeks Of Treatment: 7 Clustered Wound: No Wound Measurements Length: (cm) 2.4 Width: (cm) 1 Depth: (cm) 0.2 Area: (cm) 1.885  Volume: (cm) 0.377 % Reduction in Area: 44% % Reduction in Volume: 62.7% Epithelialization: Small (1-33%) Tunneling: No Undermining: No Wound Description Classification: Full Thickness Without Exposed Support Structures Wound Margin: Distinct, outline attached Exudate Amount: Medium Exudate Type: Serosanguineous Exudate Color: red, brown Foul Odor After Cleansing: No Slough/Fibrino Yes Wound Bed Granulation Amount: Large (67-100%) Exposed Structure Granulation Quality: Red Fascia Exposed: No Necrotic Amount: Small (1-33%) Fat Layer (Subcutaneous Tissue) Exposed: Yes Necrotic Quality: Adherent Slough Tendon Exposed: No Muscle Exposed: No Joint Exposed: No Bone Exposed: No Treatment Notes Wound #1 (Lower Leg) Wound Laterality: Left, Medial Cleanser Peri-Wound Care Triamcinolone 15 (g) Discharge Instruction: Use triamcinolone 15 (g) mix with ketoconazole to rash medial ankle Sween Lotion (Moisturizing lotion) Discharge Instruction: Apply moisturizing lotion to  leg Topical Primary Dressing Promogran Prisma Matrix, 4.34 (sq in) (silver collagen) Discharge Instruction: Moisten collagen with saline or hydrogel Secondary Dressing Woven Gauze Sponge, Non-Sterile 4x4 in Discharge Instruction: Apply over primary dressing as directed. Drawtex 4x4 in Discharge Instruction: Apply over primary dressing cut to fit inside wound margins to hold collagen in place Secured With Compression Wrap ThreePress (3 layer compression wrap) Discharge Instruction: Apply three layer compression as directed. Compression Stockings Add-Ons Electronic Signature(s) Signed: 04/10/2020 2:31:34 PM By: [name] RN, BSN Previous Signature: 04/05/2020 10:14:31 AM Version By: Karl Ito Entered By: [name] on 04/05/2020 10:20:13 -------------------------------------------------------------------------------- Vitals Details Patient Name: Date of Service: [name], [name] DA 04/05/2020 10:00 A M Medical Record Number: [id number] Patient Account Number: [id number] Date of Birth/Sex: Treating RN: 1960-11-01 (60 y.o. [name] Primary Care Anastasio Wogan: Theodis Shove Other Clinician: Referring Baylon Santelli: Treating Keshan Reha/Extender: [name] in Treatment: 7 Vital Signs Time Taken: 10:06 Temperature (F): 98.5 Height (in): 64 Pulse (bpm): 102 Weight (lbs): 278 Respiratory Rate (breaths/min): 16 Body Mass Index (BMI): 47.7 Blood Pressure (mmHg): 150/89 Reference Range: 80 - 120 mg / dl Electronic Signature(s) Signed: 04/05/2020 10:14:31 AM By: Karl Ito Entered By: Karl Ito on 04/05/2020 10:07:13

## 2020-04-11 NOTE — Progress Notes (Signed)
[name], [name] ([id number]) Visit Report for 04/05/2020 SuperBill Details Patient Name: Date of Service: [place] DA 04/05/2020 Medical Record Number: [id number] Patient Account Number: [id number] Date of Birth/Sex: Treating RN: [birth year] (60 y.o. [name] Primary Care Provider: Theodis Shove Other Clinician: Referring Provider: Treating Provider/Extender: [name] in Treatment: 7 Diagnosis Coding ICD-10 Codes Code Description I87.2 Venous insufficiency (chronic) (peripheral) L97.822 Non-pressure chronic ulcer of other part of left lower leg with fat layer exposed I10 Essential (primary) hypertension Facility Procedures CPT4 Code Description Modifier Quantity [id number] (Facility Use Only) [phone] - APPLY MULTLAY COMPRS LWR LT LEG 1 Electronic Signature(s) Signed: 04/10/2020 2:31:34 PM By: [name] RN, BSN Signed: 04/11/2020 4:34:46 PM By: Lenda Kelp PA-C Entered By: [name] on 04/05/2020 10:36:33

## 2020-04-12 ENCOUNTER — Encounter (HOSPITAL_BASED_OUTPATIENT_CLINIC_OR_DEPARTMENT_OTHER): Payer: BC Managed Care – PPO | Attending: Physician Assistant | Admitting: Physician Assistant

## 2020-04-12 ENCOUNTER — Other Ambulatory Visit: Payer: Self-pay

## 2020-04-12 DIAGNOSIS — I872 Venous insufficiency (chronic) (peripheral): Secondary | ICD-10-CM | POA: Insufficient documentation

## 2020-04-12 DIAGNOSIS — L97822 Non-pressure chronic ulcer of other part of left lower leg with fat layer exposed: Secondary | ICD-10-CM | POA: Diagnosis present

## 2020-04-12 DIAGNOSIS — I10 Essential (primary) hypertension: Secondary | ICD-10-CM | POA: Insufficient documentation

## 2020-04-12 NOTE — Progress Notes (Addendum)
[name], [name] ([id number]) Visit Report for 04/12/2020 Chief Complaint Document Details Patient Name: Date of Service: [name] DA 04/12/2020 3:30 PM Medical Record Number: [id number] Patient Account Number: [id number] Date of Birth/Sex: Treating RN: 01-May-1960 (60 y.o. Wynelle Link Primary Care Provider: Theodis Shove Other Clinician: Referring Provider: Treating Provider/Extender: [name] in Treatment: 8 Information Obtained from: Patient Chief Complaint Left LE Ulcer Electronic Signature(s) Signed: 04/12/2020 3:45:16 PM By: Lenda Kelp PA-C Entered By: Lenda Kelp on 04/12/2020 15:45:16 -------------------------------------------------------------------------------- Debridement Details Patient Name: Date of Service: [place], [name] DA 04/12/2020 3:30 PM Medical Record Number: [id number] Patient Account Number: [id number] Date of Birth/Sex: Treating RN: Jan 03, 1961 (60 y.o. Wynelle Link Primary Care Provider: Theodis Shove Other Clinician: Referring Provider: Treating Provider/Extender: [name] in Treatment: 8 Debridement Performed for Assessment: Wound #1 Left,Medial Lower Leg Performed By: Physician Lenda Kelp, PA Debridement Type: Debridement Severity of Tissue Pre Debridement: Fat layer exposed Level of Consciousness (Pre-procedure): Awake and Alert Pre-procedure Verification/Time Out Yes - 15:47 Taken: Start Time: 15:47 T Area Debrided (L x W): otal 1.8 (cm) x 0.6 (cm) = 1.08 (cm) Tissue and other material debrided: Non-Viable, Callus, Skin: Epidermis Level: Skin/Epidermis Debridement Description: Selective/Open Wound Instrument: Curette Bleeding: Minimum Hemostasis Achieved: Pressure End Time: 15:48 Procedural Pain: 0 Post Procedural Pain: 0 Response to Treatment: Procedure was tolerated well Level of Consciousness (Post- Awake and Alert procedure): Post Debridement  Measurements of Total Wound Length: (cm) 1.8 Width: (cm) 0.6 Depth: (cm) 0.2 Volume: (cm) 0.17 Character of Wound/Ulcer Post Debridement: Improved Severity of Tissue Post Debridement: Fat layer exposed Post Procedure Diagnosis Same as Pre-procedure Electronic Signature(s) Signed: 04/12/2020 4:44:38 PM By: Lenda Kelp PA-C Signed: 04/12/2020 5:18:45 PM By: Zandra Abts RN, BSN Entered By: Zandra Abts on 04/12/2020 15:50:09 -------------------------------------------------------------------------------- HPI Details Patient Name: Date of Service: [name], [name] DA 04/12/2020 3:30 PM Medical Record Number: [id number] Patient Account Number: [id number] Date of Birth/Sex: Treating RN: March 18, 1961 (59 y.o. Wynelle Link Primary Care Provider: Theodis Shove Other Clinician: Referring Provider: Treating Provider/Extender: [name] in Treatment: 8 History of Present Illness HPI Description: 02/16/2020 upon evaluation today patient actually appears to be doing poorly in regard to her left medial lower extremity ulcer. This is actually an area that she tells me she has had intermittent issues with over the years although has been closed for some time she typically uses compression right now she has juxta lite compression wraps. With that being said she tells me that this nonetheless open several weeks/months ago and has been given her trouble since. She does have a history of chronic venous insufficiency she is seeing specialist for this in the past she has had an ablation as well as sclerotherapy. With that being said she also has hypertension chronically which is managed by her primary care provider. In general she seems to be worsening overall with regard to the wound and states that she finally realized that she needed to come in and have somebody look at this and not continue to try to manage this on her own. No fevers, chills, nausea, vomiting, or  diarrhea. 02/23/2020 on evaluation today patient appears to be doing well with regard to her wound. This is showing some signs of improvement which is great news still were not quite at the point where I would like to be as far as the overall appearance of the wound is concerned but I  do believe this is better than last week. I do believe the Iodoflex is helping as well. 03/08/2020 upon evaluation today patient appears to be doing well with regard to her wound. She has been tolerating the dressing changes without complication. Fortunately I feel like she has made great progress with the Iodoflex but I feel like it may be the point rest to switch to something else possibly a collagen- based dressing at this time. 03/15/2020 upon evaluation today patient appears to be doing excellent in regard to her leg ulcer. She has been tolerating the dressing changes without complication. Fortunately there is no signs of active infection. Overall she is measuring a little bit smaller today which is great news. 03/22/2020 upon evaluation today patient appears to be doing well with regard to her wound. She has been tolerating the dressing changes without complication. Fortunately there is no signs of active infection at this time. 03/28/2020; patient I do not usually see however she has a wound on the left anterior lower leg secondary to chronic venous insufficiency we have been using silver collagen under compression. She arrives in clinic with a nonviable surface requiring debridement 04/12/2020 upon evaluation today patient appears to be doing well all things considered with regard to her leg ulcer. She is tolerating the dressing changes without complication there is minimal dry skin around the edges of the wound that may be trapping and stopping some of the events of the new skin I am can work on that today. Otherwise the surface of the wound appears to be doing excellent. Electronic Signature(s) Signed: 04/12/2020  3:58:01 PM By: Lenda Kelp PA-C Entered By: Lenda Kelp on 04/12/2020 15:58:01 -------------------------------------------------------------------------------- Physical Exam Details Patient Name: Date of Service: [name] DA 04/12/2020 3:30 PM Medical Record Number: [id number] Patient Account Number: [id number] Date of Birth/Sex: Treating RN: [birth year] (60 y.o. Wynelle Link Primary Care Provider: Theodis Shove Other Clinician: Referring Provider: Treating Provider/Extender: [name] in Treatment: 8 Constitutional Well-nourished and well-hydrated in no acute distress. Respiratory normal breathing without difficulty. Psychiatric this patient is able to make decisions and demonstrates good insight into disease process. Alert and Oriented x 3. pleasant and cooperative. Notes Upon inspection patient's wound bed did require minimal sharp debridement to clear away some of the necrotic tissue around the edges of the wound. Fortunately there is no evidence of active infection in regard to the wound and no significant slough buildup. Once I cleared away the edges and freed up the edges to allow to heal this appears to be doing quite well. Electronic Signature(s) Signed: 04/12/2020 3:58:23 PM By: Lenda Kelp PA-C Entered By: Lenda Kelp on 04/12/2020 15:58:23 -------------------------------------------------------------------------------- Physician Orders Details Patient Name: Date of Service: [place], [name] DA 04/12/2020 3:30 PM Medical Record Number: [id number] Patient Account Number: [id number] Date of Birth/Sex: Treating RN: 1961/03/02 (60 y.o. Wynelle Link Primary Care Provider: Theodis Shove Other Clinician: Referring Provider: Treating Provider/Extender: [name] in Treatment: 8 Verbal / Phone Orders: No Diagnosis Coding ICD-10 Coding Code Description I87.2 Venous insufficiency  (chronic) (peripheral) L97.822 Non-pressure chronic ulcer of other part of left lower leg with fat layer exposed I10 Essential (primary) hypertension Follow-up Appointments Return Appointment in 1 week. Bathing/ Shower/ Hygiene May shower with protection but do not get wound dressing(s) wet. Edema Control - Lymphedema / SCD / Other Bilateral Lower Extremities Elevate legs to the level of the heart or above for 30 minutes daily  and/or when sitting, a frequency of: - throughout the day Avoid standing for long periods of time. Exercise regularly Wound Treatment Wound #1 - Lower Leg Wound Laterality: Left, Medial Peri-Wound Care: Sween Lotion (Moisturizing lotion) 1 x Per Week Discharge Instructions: Apply moisturizing lotion to leg Prim Dressing: Promogran Prisma Matrix, 4.34 (sq in) (silver collagen) 1 x Per Week ary Discharge Instructions: Moisten collagen with saline or hydrogel Secondary Dressing: Woven Gauze Sponge, Non-Sterile 4x4 in 1 x Per Week Discharge Instructions: Apply over primary dressing as directed. Compression Wrap: ThreePress (3 layer compression wrap) 1 x Per Week Discharge Instructions: Apply three layer compression as directed. Electronic Signature(s) Signed: 04/12/2020 4:44:38 PM By: Lenda Kelp PA-C Signed: 04/12/2020 5:18:45 PM By: Zandra Abts RN, BSN Signed: 04/12/2020 5:18:45 PM By: Zandra Abts RN, BSN Entered By: Zandra Abts on 04/12/2020 15:49:49 -------------------------------------------------------------------------------- Problem List Details Patient Name: Date of Service: [place], [name] DA 04/12/2020 3:30 PM Medical Record Number: [id number] Patient Account Number: [id number] Date of Birth/Sex: Treating RN: 04-09-1960 (60 y.o. Wynelle Link Primary Care Provider: Theodis Shove Other Clinician: Referring Provider: Treating Provider/Extender: [name] in Treatment: 8 Active Problems ICD-10 Encounter Code  Description Active Date MDM Diagnosis I87.2 Venous insufficiency (chronic) (peripheral) 02/16/2020 No Yes L97.822 Non-pressure chronic ulcer of other part of left lower leg with fat layer 02/16/2020 No Yes exposed I10 Essential (primary) hypertension 02/16/2020 No Yes Inactive Problems Resolved Problems Electronic Signature(s) Signed: 04/12/2020 3:45:09 PM By: Lenda Kelp PA-C Entered By: Lenda Kelp on 04/12/2020 15:45:09 -------------------------------------------------------------------------------- Progress Note Details Patient Name: Date of Service: [place], [name] DA 04/12/2020 3:30 PM Medical Record Number: [id number] Patient Account Number: [id number] Date of Birth/Sex: Treating RN: 06/30/1960 (60 y.o. Wynelle Link Primary Care Provider: Theodis Shove Other Clinician: Referring Provider: Treating Provider/Extender: [name] in Treatment: 8 Subjective Chief Complaint Information obtained from Patient Left LE Ulcer History of Present Illness (HPI) 02/16/2020 upon evaluation today patient actually appears to be doing poorly in regard to her left medial lower extremity ulcer. This is actually an area that she tells me she has had intermittent issues with over the years although has been closed for some time she typically uses compression right now she has juxta lite compression wraps. With that being said she tells me that this nonetheless open several weeks/months ago and has been given her trouble since. She does have a history of chronic venous insufficiency she is seeing specialist for this in the past she has had an ablation as well as sclerotherapy. With that being said she also has hypertension chronically which is managed by her primary care provider. In general she seems to be worsening overall with regard to the wound and states that she finally realized that she needed to come in and have somebody look at this and not continue  to try to manage this on her own. No fevers, chills, nausea, vomiting, or diarrhea. 02/23/2020 on evaluation today patient appears to be doing well with regard to her wound. This is showing some signs of improvement which is great news still were not quite at the point where I would like to be as far as the overall appearance of the wound is concerned but I do believe this is better than last week. I do believe the Iodoflex is helping as well. 03/08/2020 upon evaluation today patient appears to be doing well with regard to her wound. She has been tolerating the dressing changes  without complication. Fortunately I feel like she has made great progress with the Iodoflex but I feel like it may be the point rest to switch to something else possibly a collagen- based dressing at this time. 03/15/2020 upon evaluation today patient appears to be doing excellent in regard to her leg ulcer. She has been tolerating the dressing changes without complication. Fortunately there is no signs of active infection. Overall she is measuring a little bit smaller today which is great news. 03/22/2020 upon evaluation today patient appears to be doing well with regard to her wound. She has been tolerating the dressing changes without complication. Fortunately there is no signs of active infection at this time. 03/28/2020; patient I do not usually see however she has a wound on the left anterior lower leg secondary to chronic venous insufficiency we have been using silver collagen under compression. She arrives in clinic with a nonviable surface requiring debridement 04/12/2020 upon evaluation today patient appears to be doing well all things considered with regard to her leg ulcer. She is tolerating the dressing changes without complication there is minimal dry skin around the edges of the wound that may be trapping and stopping some of the events of the new skin I am can work on that today. Otherwise the surface of the wound  appears to be doing excellent. Objective Constitutional Well-nourished and well-hydrated in no acute distress. Vitals Time Taken: 3:29 PM, Height: 64 in, Weight: 278 lbs, BMI: 47.7, Temperature: 97.8 F, Pulse: 94 bpm, Respiratory Rate: 16 breaths/min, Blood Pressure: 116/81 mmHg. Respiratory normal breathing without difficulty. Psychiatric this patient is able to make decisions and demonstrates good insight into disease process. Alert and Oriented x 3. pleasant and cooperative. General Notes: Upon inspection patient's wound bed did require minimal sharp debridement to clear away some of the necrotic tissue around the edges of the wound. Fortunately there is no evidence of active infection in regard to the wound and no significant slough buildup. Once I cleared away the edges and freed up the edges to allow to heal this appears to be doing quite well. Integumentary (Hair, Skin) Wound #1 status is Open. Original cause of wound was Gradually Appeared. The wound is located on the Left,Medial Lower Leg. The wound measures 1.8cm length x 0.6cm width x 0.2cm depth; 0.848cm^2 area and 0.17cm^3 volume. There is Fat Layer (Subcutaneous Tissue) exposed. There is no tunneling or undermining noted. There is a medium amount of serosanguineous drainage noted. The wound margin is distinct with the outline attached to the wound base. There is large (67-100%) red granulation within the wound bed. There is a small (1-33%) amount of necrotic tissue within the wound bed including Adherent Slough. Assessment Active Problems ICD-10 Venous insufficiency (chronic) (peripheral) Non-pressure chronic ulcer of other part of left lower leg with fat layer exposed Essential (primary) hypertension Procedures Wound #1 Pre-procedure diagnosis of Wound #1 is a Venous Leg Ulcer located on the Left,Medial Lower Leg .Severity of Tissue Pre Debridement is: Fat layer exposed. There was a Selective/Open Wound Skin/Epidermis  Debridement with a total area of 1.08 sq cm performed by Lenda Kelp, PA. With the following instrument(s): Curette to remove Non-Viable tissue/material. Material removed includes Callus and Skin: Epidermis and. No specimens were taken. A time out was conducted at 15:47, prior to the start of the procedure. A Minimum amount of bleeding was controlled with Pressure. The procedure was tolerated well with a pain level of 0 throughout and a pain level of 0 following  the procedure. Post Debridement Measurements: 1.8cm length x 0.6cm width x 0.2cm depth; 0.17cm^3 volume. Character of Wound/Ulcer Post Debridement is improved. Severity of Tissue Post Debridement is: Fat layer exposed. Post procedure Diagnosis Wound #1: Same as Pre-Procedure Pre-procedure diagnosis of Wound #1 is a Venous Leg Ulcer located on the Left,Medial Lower Leg . There was a Three Layer Compression Therapy Procedure by Levan Hurst, RN. Post procedure Diagnosis Wound #1: Same as Pre-Procedure Plan Follow-up Appointments: Return Appointment in 1 week. Bathing/ Shower/ Hygiene: May shower with protection but do not get wound dressing(s) wet. Edema Control - Lymphedema / SCD / Other: Elevate legs to the level of the heart or above for 30 minutes daily and/or when sitting, a frequency of: - throughout the day Avoid standing for long periods of time. Exercise regularly WOUND #1: - Lower Leg Wound Laterality: Left, Medial Peri-Wound Care: Sween Lotion (Moisturizing lotion) 1 x Per Week/ Discharge Instructions: Apply moisturizing lotion to leg Prim Dressing: Promogran Prisma Matrix, 4.34 (sq in) (silver collagen) 1 x Per Week/ ary Discharge Instructions: Moisten collagen with saline or hydrogel Secondary Dressing: Woven Gauze Sponge, Non-Sterile 4x4 in 1 x Per Week/ Discharge Instructions: Apply over primary dressing as directed. Com pression Wrap: ThreePress (3 layer compression wrap) 1 x Per Week/ Discharge Instructions:  Apply three layer compression as directed. 1. Would recommend currently that we go and continue with the wound care measures as before and the patient is in agreement the plan. We will continue to monitor for any signs of worsening infection going forward. 2. I would recommend as well that we continue with the current dressings and specifically the collagen which seems to be doing a great job. 3. We will also continue with a 3 layer compression wrap that seems to be doing well. We will see patient back for reevaluation in 1 week here in the clinic. If anything worsens or changes patient will contact our office for additional recommendations. Electronic Signature(s) Signed: 04/12/2020 3:58:55 PM By: Worthy Keeler PA-C Entered By: Worthy Keeler on 04/12/2020 15:58:54 -------------------------------------------------------------------------------- SuperBill Details Patient Name: Date of Service: [place], [place] 04/12/2020 Medical Record Number: [id number] Patient Account Number: [id number] Date of Birth/Sex: Treating RN: 01-17-1961 (60 y.o. [name] Primary Care Provider: Micheline Rough Other Clinician: Referring Provider: Treating Provider/Extender: Vaughan Basta Weeks in Treatment: 8 Diagnosis Coding ICD-10 Codes Code Description I87.2 Venous insufficiency (chronic) (peripheral) L97.822 Non-pressure chronic ulcer of other part of left lower leg with fat layer exposed [place] (primary) hypertension Facility Procedures CPT4 Code: [id number] Description: [phone] - DEBRIDE WOUND 1ST 20 SQ CM OR < ICD-10 Diagnosis Description L97.822 Non-pressure chronic ulcer of other part of left lower leg with fat layer exposed Modifier: Quantity: 1 Physician Procedures : CPT4 Code Description Modifier [id number] - WC PHYS DEBR WO ANESTH 20 SQ CM ICD-10 Diagnosis Description L97.822 Non-pressure chronic ulcer of other part of left lower leg with fat layer  exposed Quantity: 1 Electronic Signature(s) Signed: 04/12/2020 3:59:04 PM By: Worthy Keeler PA-C Entered By: Worthy Keeler on 04/12/2020 15:59:04

## 2020-04-14 NOTE — Progress Notes (Signed)
RILDA, BULLS ([id number]) Visit Report for 04/12/2020 Arrival Information Details Patient Name: Date of Service: [name], [name] 04/12/2020 3:30 PM Medical Record Number: [id number] Patient Account Number: [id number] Date of Birth/Sex: Treating RN: [birth year] (60 y.o. [name] Primary Care Provider: Micheline Rough Other Clinician: Referring Provider: Treating Provider/Extender: Vaughan Basta Weeks in Treatment: 8 Visit Information History Since Last Visit Added or deleted any medications: No Patient Arrived: Ambulatory Any new allergies or adverse reactions: No Arrival Time: 15:29 Had a fall or experienced change in No Accompanied By: self activities of daily living that may affect Transfer Assistance: None risk of falls: Patient Identification Verified: Yes Signs or symptoms of abuse/neglect since last visito No Secondary Verification Process Completed: Yes Hospitalized since last visit: No Patient Requires Transmission-Based Precautions: No Implantable device outside of the clinic excluding No Patient Has Alerts: Yes cellular tissue based products placed in the center Patient Alerts: ABI noncompressible since last visit: Has Dressing in Place as Prescribed: Yes Pain Present Now: No Electronic Signature(s) Signed: 04/14/2020 11:05:27 AM By: Sandre Kitty Entered By: Sandre Kitty on 04/12/2020 15:29:39 -------------------------------------------------------------------------------- Compression Therapy Details Patient Name: Date of Service: [name], [name] [name] 04/12/2020 3:30 PM Medical Record Number: [id number] Patient Account Number: [id number] Date of Birth/Sex: Treating RN: 05-01-1960 (60 y.o. [name] Primary Care Provider: Micheline Rough Other Clinician: Referring Provider: Treating Provider/Extender: Vaughan Basta Weeks in Treatment: 8 Compression Therapy Performed for Wound Assessment: Wound #1  Left,Medial Lower Leg Performed By: Clinician Levan Hurst, RN Compression Type: Three Layer Post Procedure Diagnosis Same as Pre-procedure Electronic Signature(s) Signed: 04/12/2020 5:18:45 PM By: Levan Hurst RN, BSN Entered By: Levan Hurst on 04/12/2020 15:49:04 -------------------------------------------------------------------------------- Encounter Discharge Information Details Patient Name: Date of Service: [name], [name] 04/12/2020 3:30 PM Medical Record Number: [id number] Patient Account Number: [id number] Date of Birth/Sex: Treating RN: 06-Sep-1960 (60 y.o. [name], [name] Primary Care Provider: Micheline Rough Other Clinician: Referring Provider: Treating Provider/Extender: Vaughan Basta Weeks in Treatment: 8 Encounter Discharge Information Items Post Procedure Vitals Discharge Condition: Stable Temperature (F): 97.8 Ambulatory Status: Ambulatory Pulse (bpm): 74 Discharge Destination: Home Respiratory Rate (breaths/min): 18 Transportation: Private Auto Blood Pressure (mmHg): 117/82 Accompanied By: self Schedule Follow-up Appointment: Yes Clinical Summary of Care: Patient Declined Electronic Signature(s) Signed: 04/14/2020 4:05:28 PM By: Rhae Hammock RN Entered By: Rhae Hammock on 04/12/2020 16:16:52 -------------------------------------------------------------------------------- Lower Extremity Assessment Details Patient Name: Date of Service: [name], [name] 04/12/2020 3:30 PM Medical Record Number: [id number] Patient Account Number: [id number] Date of Birth/Sex: Treating RN: 03/31/1961 (60 y.o. [name], [name] Primary Care Provider: Micheline Rough Other Clinician: Referring Provider: Treating Provider/Extender: Vaughan Basta Weeks in Treatment: 8 Edema Assessment Assessed: [Left: No] [Right: No] Edema: [Left: Ye] [Right: s] Calf Left: Right: Point of Measurement: 35 cm From Medial  Instep 44 cm Ankle Left: Right: Point of Measurement: 9 cm From Medial Instep 22.5 cm Vascular Assessment Pulses: Dorsalis Pedis Palpable: [Left:Yes] Posterior Tibial Palpable: [Left:Yes] Electronic Signature(s) Signed: 04/14/2020 4:05:28 PM By: Rhae Hammock RN Entered By: Rhae Hammock on 04/12/2020 15:37:31 -------------------------------------------------------------------------------- Multi-Disciplinary Care Plan Details Patient Name: Date of Service: [name], [name] 04/12/2020 3:30 PM Medical Record Number: [id number] Patient Account Number: [id number] Date of Birth/Sex: Treating RN: [birth year] (60 y.o. [name] Primary Care Provider: Micheline Rough Other Clinician: Referring Provider: Treating Provider/Extender: Vaughan Basta Weeks in Treatment: 8 Active Inactive Venous Leg Ulcer Nursing Diagnoses: Knowledge deficit  related to disease process and management Potential for venous Insuffiency (use before diagnosis confirmed) Goals: Patient will maintain optimal edema control Date Initiated: 02/16/2020 Target Resolution Date: 04/19/2020 Goal Status: Active Interventions: Assess peripheral edema status every visit. Compression as ordered Provide education on venous insufficiency Treatment Activities: Therapeutic compression applied : 02/16/2020 Notes: Wound/Skin Impairment Nursing Diagnoses: Impaired tissue integrity Knowledge deficit related to ulceration/compromised skin integrity Goals: Patient/caregiver will verbalize understanding of skin care regimen Date Initiated: 02/16/2020 Target Resolution Date: 04/19/2020 Goal Status: Active Ulcer/skin breakdown will have a volume reduction of 30% by week 4 Date Initiated: 02/16/2020 Date Inactivated: 03/22/2020 Target Resolution Date: 03/15/2020 Goal Status: Met Ulcer/skin breakdown will have a volume reduction of 50% by week 8 Date Initiated: 03/22/2020 Target Resolution  Date: 04/19/2020 Goal Status: Active Interventions: Assess patient/caregiver ability to obtain necessary supplies Assess patient/caregiver ability to perform ulcer/skin care regimen upon admission and as needed Assess ulceration(s) every visit Provide education on ulcer and skin care Treatment Activities: Skin care regimen initiated : 02/16/2020 Topical wound management initiated : 02/16/2020 Notes: Electronic Signature(s) Signed: 04/12/2020 5:18:45 PM By: Levan Hurst RN, BSN Entered By: Levan Hurst on 04/12/2020 16:23:18 -------------------------------------------------------------------------------- Pain Assessment Details Patient Name: Date of Service: [name], [place] 04/12/2020 3:30 PM Medical Record Number: [id number] Patient Account Number: [id number] Date of Birth/Sex: Treating RN: 1960-11-24 (60 y.o. [name] Primary Care [name]: Micheline Rough Other Clinician: Referring [name]: Treating [name]/Extender: Vaughan Basta Weeks in Treatment: 8 Active Problems Location of Pain Severity and Description of Pain Patient Has Paino No Site Locations Pain Management and Medication Current Pain Management: Electronic Signature(s) Signed: 04/12/2020 5:18:45 PM By: Levan Hurst RN, BSN Signed: 04/14/2020 11:05:27 AM By: Sandre Kitty Entered By: Sandre Kitty on 04/12/2020 15:30:00 -------------------------------------------------------------------------------- Patient/Caregiver Education Details Patient Name: Date of Service: [name] [name] 1/5/2022andnbsp3:30 PM Medical Record Number: [id number] Patient Account Number: [id number] Date of Birth/Gender: Treating RN: [birth year] (60 y.o. [name] Primary Care Physician: Micheline Rough Other Clinician: Referring Physician: Treating Physician/Extender: Stevie Kern in Treatment: 8 Education Assessment Education Provided  To: Patient Education Topics Provided Wound/Skin Impairment: Methods: Explain/Verbal Responses: State content correctly Electronic Signature(s) Signed: 04/12/2020 5:18:45 PM By: Levan Hurst RN, BSN Entered By: Levan Hurst on 04/12/2020 16:23:58 -------------------------------------------------------------------------------- Wound Assessment Details Patient Name: Date of Service: [name], [place] 04/12/2020 3:30 PM Medical Record Number: [id number] Patient Account Number: [id number] Date of Birth/Sex: Treating RN: 1960/10/23 (60 y.o. [name] Primary Care Shavanna Furnari: Micheline Rough Other Clinician: Referring Mililani Murthy: Treating Augustina Braddock/Extender: Vaughan Basta Weeks in Treatment: 8 Wound Status Wound Number: 1 Primary Venous Leg Ulcer Etiology: Wound Location: Left, Medial Lower Leg Wound Status: Open Wounding Event: Gradually Appeared Comorbid Anemia, Sleep Apnea, Hypertension, Peripheral Venous Date Acquired: 01/26/2020 History: Disease Weeks Of Treatment: 8 Clustered Wound: No Wound Measurements Length: (cm) 1.8 Width: (cm) 0.6 Depth: (cm) 0.2 Area: (cm) 0.848 Volume: (cm) 0.17 % Reduction in Area: 74.8% % Reduction in Volume: 83.2% Epithelialization: Small (1-33%) Tunneling: No Undermining: No Wound Description Classification: Full Thickness Without Exposed Support Structures Wound Margin: Distinct, outline attached Exudate Amount: Medium Exudate Type: Serosanguineous Exudate Color: red, brown Foul Odor After Cleansing: No Slough/Fibrino Yes Wound Bed Granulation Amount: Large (67-100%) Exposed Structure Granulation Quality: Red Fascia Exposed: No Necrotic Amount: Small (1-33%) Fat Layer (Subcutaneous Tissue) Exposed: Yes Necrotic Quality: Adherent Slough Tendon Exposed: No Muscle Exposed: No Joint Exposed: No Bone Exposed: No Treatment Notes Wound #1 (Lower  Leg) Wound Laterality: Left, Medial Cleanser Peri-Wound  Care Sween Lotion (Moisturizing lotion) Discharge Instruction: Apply moisturizing lotion to leg Topical Primary Dressing Promogran Prisma Matrix, 4.34 (sq in) (silver collagen) Discharge Instruction: Moisten collagen with saline or hydrogel Secondary Dressing Woven Gauze Sponge, Non-Sterile 4x4 in Discharge Instruction: Apply over primary dressing as directed. Secured With Compression Wrap ThreePress (3 layer compression wrap) Discharge Instruction: Apply three layer compression as directed. Compression Stockings Add-Ons Electronic Signature(s) Signed: 04/12/2020 5:18:45 PM By: Levan Hurst RN, BSN Signed: 04/14/2020 4:05:28 PM By: Rhae Hammock RN Entered By: Rhae Hammock on 04/12/2020 15:37:51 -------------------------------------------------------------------------------- Vitals Details Patient Name: Date of Service: [name], [place] 04/12/2020 3:30 PM Medical Record Number: [id number] Patient Account Number: [id number] Date of Birth/Sex: Treating RN: [birth year] (60 y.o. [name] Primary Care Provider: Micheline Rough Other Clinician: Referring Provider: Treating Provider/Extender: Vaughan Basta Weeks in Treatment: 8 Vital Signs Time Taken: 15:29 Temperature (F): 97.8 Height (in): 64 Pulse (bpm): 94 Weight (lbs): 278 Respiratory Rate (breaths/min): 16 Body Mass Index (BMI): 47.7 Blood Pressure (mmHg): 116/81 Reference Range: 80 - 120 mg / dl Electronic Signature(s) Signed: 04/14/2020 11:05:27 AM By: Sandre Kitty Entered By: Sandre Kitty on 04/12/2020 15:29:53

## 2020-04-19 ENCOUNTER — Encounter (HOSPITAL_BASED_OUTPATIENT_CLINIC_OR_DEPARTMENT_OTHER): Payer: BC Managed Care – PPO | Admitting: Physician Assistant

## 2020-04-19 ENCOUNTER — Other Ambulatory Visit: Payer: Self-pay | Admitting: Family Medicine

## 2020-04-19 ENCOUNTER — Other Ambulatory Visit: Payer: Self-pay

## 2020-04-19 DIAGNOSIS — L97822 Non-pressure chronic ulcer of other part of left lower leg with fat layer exposed: Secondary | ICD-10-CM | POA: Diagnosis not present

## 2020-04-19 DIAGNOSIS — E039 Hypothyroidism, unspecified: Secondary | ICD-10-CM

## 2020-04-19 NOTE — Progress Notes (Addendum)
[name], [name] ([id number]) Visit Report for 04/19/2020 Chief Complaint Document Details Patient Name: Date of Service: [name] DA 04/19/2020 3:30 PM Medical Record Number: [id number] Patient Account Number: [id number] Date of Birth/Sex: Treating RN: 1960-11-08 (60 y.o. [name] Primary Care Provider: Theodis Shove Other Clinician: Referring Provider: Treating Provider/Extender: [name] in Treatment: 9 Information Obtained from: Patient Chief Complaint Left LE Ulcer Electronic Signature(s) Signed: 04/19/2020 3:54:36 PM By: Lenda Kelp PA-C Entered By: Lenda Kelp on 04/19/2020 15:54:35 -------------------------------------------------------------------------------- Debridement Details Patient Name: Date of Service: [place], [name] DA 04/19/2020 3:30 PM Medical Record Number: [id number] Patient Account Number: [id number] Date of Birth/Sex: Treating RN: 01-31-1961 (60 y.o. [name] Primary Care Provider: Theodis Shove Other Clinician: Referring Provider: Treating Provider/Extender: [name] in Treatment: 9 Debridement Performed for Assessment: Wound #1 Left,Medial Lower Leg Performed By: Physician Lenda Kelp, PA Debridement Type: Debridement Severity of Tissue Pre Debridement: Fat layer exposed Level of Consciousness (Pre-procedure): Awake and Alert Pre-procedure Verification/Time Out Yes - 16:25 Taken: Start Time: 16:25 Pain Control: Lidocaine 4% T opical Solution T Area Debrided (L x W): otal 1.2 (cm) x 0.5 (cm) = 0.6 (cm) Tissue and other material debrided: Viable, Non-Viable, Slough, Subcutaneous, Slough Level: Skin/Subcutaneous Tissue Debridement Description: Excisional Instrument: Curette Bleeding: Minimum Hemostasis Achieved: Pressure End Time: 16:29 Procedural Pain: 2 Post Procedural Pain: 0 Response to Treatment: Procedure was tolerated well Level of  Consciousness (Post- Awake and Alert procedure): Post Debridement Measurements of Total Wound Length: (cm) 1.2 Width: (cm) 0.5 Depth: (cm) 0.3 Volume: (cm) 0.141 Character of Wound/Ulcer Post Debridement: Improved Severity of Tissue Post Debridement: Fat layer exposed Post Procedure Diagnosis Same as Pre-procedure Electronic Signature(s) Signed: 04/19/2020 5:15:08 PM By: Lenda Kelp PA-C Signed: 04/19/2020 5:15:42 PM By: [name] RN, BSN Entered By: [name] on 04/19/2020 16:27:26 -------------------------------------------------------------------------------- HPI Details Patient Name: Date of Service: [name], [name] DA 04/19/2020 3:30 PM Medical Record Number: [id number] Patient Account Number: [id number] Date of Birth/Sex: Treating RN: 1960/08/08 (60 y.o. [name] Primary Care Provider: Theodis Shove Other Clinician: Referring Provider: Treating Provider/Extender: [name] in Treatment: 9 History of Present Illness HPI Description: 02/16/2020 upon evaluation today patient actually appears to be doing poorly in regard to her left medial lower extremity ulcer. This is actually an area that she tells me she has had intermittent issues with over the years although has been closed for some time she typically uses compression right now she has juxta lite compression wraps. With that being said she tells me that this nonetheless open several weeks/months ago and has been given her trouble since. She does have a history of chronic venous insufficiency she is seeing specialist for this in the past she has had an ablation as well as sclerotherapy. With that being said she also has hypertension chronically which is managed by her primary care provider. In general she seems to be worsening overall with regard to the wound and states that she finally realized that she needed to come in and have somebody look at this and not continue  to try to manage this on her own. No fevers, chills, nausea, vomiting, or diarrhea. 02/23/2020 on evaluation today patient appears to be doing well with regard to her wound. This is showing some signs of improvement which is great news still were not quite at the point where I would like to be as far as the overall  appearance of the wound is concerned but I do believe this is better than last week. I do believe the Iodoflex is helping as well. 03/08/2020 upon evaluation today patient appears to be doing well with regard to her wound. She has been tolerating the dressing changes without complication. Fortunately I feel like she has made great progress with the Iodoflex but I feel like it may be the point rest to switch to something else possibly a collagen- based dressing at this time. 03/15/2020 upon evaluation today patient appears to be doing excellent in regard to her leg ulcer. She has been tolerating the dressing changes without complication. Fortunately there is no signs of active infection. Overall she is measuring a little bit smaller today which is great news. 03/22/2020 upon evaluation today patient appears to be doing well with regard to her wound. She has been tolerating the dressing changes without complication. Fortunately there is no signs of active infection at this time. 03/28/2020; patient I do not usually see however she has a wound on the left anterior lower leg secondary to chronic venous insufficiency we have been using silver collagen under compression. She arrives in clinic with a nonviable surface requiring debridement 04/12/2020 upon evaluation today patient appears to be doing well all things considered with regard to her leg ulcer. She is tolerating the dressing changes without complication there is minimal dry skin around the edges of the wound that may be trapping and stopping some of the events of the new skin I am can work on that today. Otherwise the surface of the wound  appears to be doing excellent. 04/19/2020 upon evaluation today patient appears to be doing well with regard to her leg ulcer. She has been tolerating dressing changes without complication. Fortunately there is no signs of active infection at this time. No fever chills noted overall very pleased with how things seem to be progressing. Electronic Signature(s) Signed: 04/19/2020 5:02:58 PM By: Lenda Kelp PA-C Entered By: Lenda Kelp on 04/19/2020 17:02:57 -------------------------------------------------------------------------------- Physical Exam Details Patient Name: Date of Service: [name] DA 04/19/2020 3:30 PM Medical Record Number: [id number] Patient Account Number: [id number] Date of Birth/Sex: Treating RN: [birth year] (60 y.o. [name] Primary Care Provider: Theodis Shove Other Clinician: Referring Provider: Treating Provider/Extender: [name] in Treatment: 9 Constitutional Well-nourished and well-hydrated in no acute distress. Respiratory normal breathing without difficulty. Psychiatric this patient is able to make decisions and demonstrates good insight into disease process. Alert and Oriented x 3. pleasant and cooperative. Notes On inspection patient's wound bed actually showed signs of good granulation epithelization at this point. I do believe the collagen is helping her and I I am going to recommend currently that we continue with such with the wound care measures. The patient is in agreement with this plan. Sharp debridement was performed and postdebridement the wound bed appears to be doing much better. Electronic Signature(s) Signed: 04/19/2020 5:03:30 PM By: Lenda Kelp PA-C Entered By: Lenda Kelp on 04/19/2020 17:03:30 -------------------------------------------------------------------------------- Physician Orders Details Patient Name: Date of Service: [place], [name] DA 04/19/2020 3:30 PM Medical  Record Number: [id number] Patient Account Number: [id number] Date of Birth/Sex: Treating RN: May 10, 1960 (60 y.o. [name] Primary Care Provider: Theodis Shove Other Clinician: Referring Provider: Treating Provider/Extender: [name] in Treatment: 9 Verbal / Phone Orders: No Diagnosis Coding ICD-10 Coding Code Description I87.2 Venous insufficiency (chronic) (peripheral) L97.822 Non-pressure chronic ulcer of other part of  left lower leg with fat layer exposed I10 Essential (primary) hypertension Follow-up Appointments Return Appointment in 1 week. Bathing/ Shower/ Hygiene May shower with protection but do not get wound dressing(s) wet. Edema Control - Lymphedema / SCD / Other Bilateral Lower Extremities Elevate legs to the level of the heart or above for 30 minutes daily and/or when sitting, a frequency of: - throughout the day Avoid standing for long periods of time. Exercise regularly Wound Treatment Wound #1 - Lower Leg Wound Laterality: Left, Medial Peri-Wound Care: Sween Lotion (Moisturizing lotion) 1 x Per Week Discharge Instructions: Apply moisturizing lotion to leg Prim Dressing: Promogran Prisma Matrix, 4.34 (sq in) (silver collagen) 1 x Per Week ary Discharge Instructions: Moisten collagen with saline or hydrogel Secondary Dressing: Woven Gauze Sponge, Non-Sterile 4x4 in 1 x Per Week Discharge Instructions: Apply over primary dressing as directed. Compression Wrap: ThreePress (3 layer compression wrap) 1 x Per Week Discharge Instructions: Apply three layer compression as directed. Electronic Signature(s) Signed: 04/19/2020 5:15:08 PM By: Lenda KelpStone III, Oiva Dibari PA-C Signed: 04/19/2020 5:15:42 PM By: Zenaida DeedBoehlein, [name] RN, BSN Entered By: Zenaida DeedBoehlein, [name] on 04/19/2020 16:28:33 -------------------------------------------------------------------------------- Problem List Details Patient Name: Date of Service: [name] 04/19/2020  3:30 PM Medical Record Number: [id number] Patient Account Number: [id number] Date of Birth/Sex: Treating RN: 09/09/1960 (60 y.o. [name] StandardF) Boehlein, [name] Primary Care Provider: Theodis ShoveKoberlein, Junell Other Clinician: Referring Provider: Treating Provider/Extender: Esaw DaceStone III, Anjoli Diemer Koberlein, Junell Weeks in Treatment: 9 Active Problems ICD-10 Encounter Code Description Active Date MDM Diagnosis I87.2 Venous insufficiency (chronic) (peripheral) 02/16/2020 No Yes L97.822 Non-pressure chronic ulcer of other part of left lower leg with fat layer 02/16/2020 No Yes exposed I10 Essential (primary) hypertension 02/16/2020 No Yes Inactive Problems Resolved Problems Electronic Signature(s) Signed: 04/19/2020 3:54:29 PM By: Lenda KelpStone III, Juanantonio Stolar PA-C Entered By: Lenda KelpStone III, Auria Mckinlay on 04/19/2020 15:54:28 -------------------------------------------------------------------------------- Progress Note Details Patient Name: Date of Service: [name] 04/19/2020 3:30 PM Medical Record Number: [id number] Patient Account Number: [id number] Date of Birth/Sex: Treating RN: 12/23/1960 (60 y.o. [name] StandardF) Boehlein, [name] Primary Care Provider: Theodis ShoveKoberlein, Junell Other Clinician: Referring Provider: Treating Provider/Extender: Esaw DaceStone III, Toluwanimi Radebaugh Koberlein, Junell Weeks in Treatment: 9 Subjective Chief Complaint Information obtained from Patient Left LE Ulcer History of Present Illness (HPI) 02/16/2020 upon evaluation today patient actually appears to be doing poorly in regard to her left medial lower extremity ulcer. This is actually an area that she tells me she has had intermittent issues with over the years although has been closed for some time she typically uses compression right now she has juxta lite compression wraps. With that being said she tells me that this nonetheless open several weeks/months ago and has been given her trouble since. She does have a history of chronic venous insufficiency she is seeing  specialist for this in the past she has had an ablation as well as sclerotherapy. With that being said she also has hypertension chronically which is managed by her primary care provider. In general she seems to be worsening overall with regard to the wound and states that she finally realized that she needed to come in and have somebody look at this and not continue to try to manage this on her own. No fevers, chills, nausea, vomiting, or diarrhea. 02/23/2020 on evaluation today patient appears to be doing well with regard to her wound. This is showing some signs of improvement which is great news still were not quite at the point where I would like to be as far as the  overall appearance of the wound is concerned but I do believe this is better than last week. I do believe the Iodoflex is helping as well. 03/08/2020 upon evaluation today patient appears to be doing well with regard to her wound. She has been tolerating the dressing changes without complication. Fortunately I feel like she has made great progress with the Iodoflex but I feel like it may be the point rest to switch to something else possibly a collagen- based dressing at this time. 03/15/2020 upon evaluation today patient appears to be doing excellent in regard to her leg ulcer. She has been tolerating the dressing changes without complication. Fortunately there is no signs of active infection. Overall she is measuring a little bit smaller today which is great news. 03/22/2020 upon evaluation today patient appears to be doing well with regard to her wound. She has been tolerating the dressing changes without complication. Fortunately there is no signs of active infection at this time. 03/28/2020; patient I do not usually see however she has a wound on the left anterior lower leg secondary to chronic venous insufficiency we have been using silver collagen under compression. She arrives in clinic with a nonviable surface requiring  debridement 04/12/2020 upon evaluation today patient appears to be doing well all things considered with regard to her leg ulcer. She is tolerating the dressing changes without complication there is minimal dry skin around the edges of the wound that may be trapping and stopping some of the events of the new skin I am can work on that today. Otherwise the surface of the wound appears to be doing excellent. 04/19/2020 upon evaluation today patient appears to be doing well with regard to her leg ulcer. She has been tolerating dressing changes without complication. Fortunately there is no signs of active infection at this time. No fever chills noted overall very pleased with how things seem to be progressing. Objective Constitutional Well-nourished and well-hydrated in no acute distress. Vitals Time Taken: 3:45 PM, Height: 64 in, Weight: 278 lbs, BMI: 47.7, Temperature: 98.3 F, Pulse: 97 bpm, Respiratory Rate: 18 breaths/min, Blood Pressure: 150/80 mmHg. Respiratory normal breathing without difficulty. Psychiatric this patient is able to make decisions and demonstrates good insight into disease process. Alert and Oriented x 3. pleasant and cooperative. General Notes: On inspection patient's wound bed actually showed signs of good granulation epithelization at this point. I do believe the collagen is helping her and I I am going to recommend currently that we continue with such with the wound care measures. The patient is in agreement with this plan. Sharp debridement was performed and postdebridement the wound bed appears to be doing much better. Integumentary (Hair, Skin) Wound #1 status is Open. Original cause of wound was Gradually Appeared. The wound is located on the Left,Medial Lower Leg. The wound measures 1.2cm length x 0.5cm width x 0.3cm depth; 0.471cm^2 area and 0.141cm^3 volume. There is Fat Layer (Subcutaneous Tissue) exposed. There is no tunneling or undermining noted. There is a  medium amount of serosanguineous drainage noted. The wound margin is distinct with the outline attached to the wound base. There is large (67-100%) red granulation within the wound bed. There is a small (1-33%) amount of necrotic tissue within the wound bed including Adherent Slough. Assessment Active Problems ICD-10 Venous insufficiency (chronic) (peripheral) Non-pressure chronic ulcer of other part of left lower leg with fat layer exposed Essential (primary) hypertension Procedures Wound #1 Pre-procedure diagnosis of Wound #1 is a Venous Leg Ulcer located on  the Left,Medial Lower Leg .Severity of Tissue Pre Debridement is: Fat layer exposed. There was a Excisional Skin/Subcutaneous Tissue Debridement with a total area of 0.6 sq cm performed by Lenda Kelp, PA. With the following instrument(s): Curette to remove Viable and Non-Viable tissue/material. Material removed includes Subcutaneous Tissue and Slough and after achieving pain control using Lidocaine 4% T opical Solution. No specimens were taken. A time out was conducted at 16:25, prior to the start of the procedure. A Minimum amount of bleeding was controlled with Pressure. The procedure was tolerated well with a pain level of 2 throughout and a pain level of 0 following the procedure. Post Debridement Measurements: 1.2cm length x 0.5cm width x 0.3cm depth; 0.141cm^3 volume. Character of Wound/Ulcer Post Debridement is improved. Severity of Tissue Post Debridement is: Fat layer exposed. Post procedure Diagnosis Wound #1: Same as Pre-Procedure Pre-procedure diagnosis of Wound #1 is a Venous Leg Ulcer located on the Left,Medial Lower Leg . There was a Three Layer Compression Therapy Procedure by Fonnie Mu, RN. Post procedure Diagnosis Wound #1: Same as Pre-Procedure Plan Follow-up Appointments: Return Appointment in 1 week. Bathing/ Shower/ Hygiene: May shower with protection but do not get wound dressing(s) wet. Edema  Control - Lymphedema / SCD / Other: Elevate legs to the level of the heart or above for 30 minutes daily and/or when sitting, a frequency of: - throughout the day Avoid standing for long periods of time. Exercise regularly WOUND #1: - Lower Leg Wound Laterality: Left, Medial Peri-Wound Care: Sween Lotion (Moisturizing lotion) 1 x Per Week/ Discharge Instructions: Apply moisturizing lotion to leg Prim Dressing: Promogran Prisma Matrix, 4.34 (sq in) (silver collagen) 1 x Per Week/ ary Discharge Instructions: Moisten collagen with saline or hydrogel Secondary Dressing: Woven Gauze Sponge, Non-Sterile 4x4 in 1 x Per Week/ Discharge Instructions: Apply over primary dressing as directed. Com pression Wrap: ThreePress (3 layer compression wrap) 1 x Per Week/ Discharge Instructions: Apply three layer compression as directed. 1. Would recommend currently that we go ahead and initiate treatment with a continuation of the silver collagen dressing as a filling vest on a great job for the patient. 2. Months on the recommend she continue to elevate her legs much as possible also using a 3 layer compression wrap that seems to be doing a great job. We will see patient back for reevaluation in 1 week here in the clinic. If anything worsens or changes patient will contact our office for additional recommendations. Electronic Signature(s) Signed: 04/19/2020 5:04:03 PM By: Lenda Kelp PA-C Entered By: Lenda Kelp on 04/19/2020 17:04:02 -------------------------------------------------------------------------------- SuperBill Details Patient Name: Date of Service: [place], [name] DA 04/19/2020 Medical Record Number: [id number] Patient Account Number: [id number] Date of Birth/Sex: Treating RN: [birth year] (60 y.o. [name], [name] Primary Care Provider: Theodis Shove Other Clinician: Referring Provider: Treating Provider/Extender: [name] in Treatment: 9 Diagnosis  Coding ICD-10 Codes Code Description I87.2 Venous insufficiency (chronic) (peripheral) L97.822 Non-pressure chronic ulcer of other part of left lower leg with fat layer exposed I10 Essential (primary) hypertension Facility Procedures CPT4 Code: [id number] IC L Description: 11042 - DEB SUBQ TISSUE 20 SQ CM/< D-10 Diagnosis Description 97.822 Non-pressure chronic ulcer of other part of left lower leg with fat layer exposed Modifier: Quantity: 1 Physician Procedures : CPT4 Code Description Modifier [id number] 11042 - WC PHYS SUBQ TISS 20 SQ CM ICD-10 Diagnosis Description L97.822 Non-pressure chronic ulcer of other part of left lower leg with fat layer exposed Quantity: 1  Electronic Signature(s) Signed: 04/19/2020 5:04:12 PM By: Lenda Kelp PA-C Entered By: Lenda Kelp on 04/19/2020 17:04:12

## 2020-04-20 NOTE — Progress Notes (Signed)
[name], [name] ([id number]) Visit Report for 04/19/2020 Arrival Information Details Patient Name: Date of Service: [name] 04/19/2020 3:30 PM Medical Record Number: [id number] Patient Account Number: [id number] Date of Birth/Sex: Treating RN: 08/09/1960 (60 y.o. [name] Primary Care [name]: Micheline Rough Other Clinician: Referring [name]: Treating [name]/Extender: Vaughan Basta Weeks in Treatment: 9 Visit Information History Since Last Visit Added or deleted any medications: No Patient Arrived: Ambulatory Any new allergies or adverse reactions: No Arrival Time: 15:37 Had a fall or experienced change in No Accompanied By: alone activities of daily living that may affect Transfer Assistance: None risk of falls: Patient Identification Verified: Yes Signs or symptoms of abuse/neglect since last visito No Secondary Verification Process Completed: Yes Hospitalized since last visit: No Patient Requires Transmission-Based Precautions: No Implantable device outside of the clinic excluding No Patient Has Alerts: Yes cellular tissue based products placed in the center Patient Alerts: ABI noncompressible since last visit: Has Dressing in Place as Prescribed: Yes Has Compression in Place as Prescribed: Yes Pain Present Now: No Electronic Signature(s) Signed: 04/20/2020 5:28:50 PM By: Levan Hurst RN, BSN Entered By: Levan Hurst on 04/19/2020 15:38:15 -------------------------------------------------------------------------------- Compression Therapy Details Patient Name: Date of Service: [name] 04/19/2020 3:30 PM Medical Record Number: [id number] Patient Account Number: [id number] Date of Birth/Sex: Treating RN: 1960-08-24 (60 y.o. [name] Primary Care Brookie Wayment: Micheline Rough Other Clinician: Referring [name]: Treating [name]/Extender: Vaughan Basta Weeks in Treatment: 9 Compression  Therapy Performed for Wound Assessment: Wound #1 Left,Medial Lower Leg Performed By: Clinician Rhae Hammock, RN Compression Type: Three Layer Post Procedure Diagnosis Same as Pre-procedure Electronic Signature(s) Signed: 04/19/2020 5:15:42 PM By: Baruch Gouty RN, BSN Entered By: Baruch Gouty on 04/19/2020 16:26:12 -------------------------------------------------------------------------------- Encounter Discharge Information Details Patient Name: Date of Service: [name] 04/19/2020 3:30 PM Medical Record Number: [id number] Patient Account Number: [id number] Date of Birth/Sex: Treating RN: 1960-12-04 (60 y.o. [name] Primary Care Arely Tinner: Micheline Rough Other Clinician: Referring [name]: Treating [name]/Extender: Vaughan Basta Weeks in Treatment: 9 Encounter Discharge Information Items Post Procedure Vitals Discharge Condition: Stable Temperature (F): 98.3 Ambulatory Status: Ambulatory Pulse (bpm): 97 Discharge Destination: Home Respiratory Rate (breaths/min): 17 Transportation: Private Auto Blood Pressure (mmHg): 150/80 Accompanied By: self Schedule Follow-up Appointment: Yes Clinical Summary of Care: Patient Declined Electronic Signature(s) Signed: 04/19/2020 5:04:31 PM By: Rhae Hammock RN Entered By: Rhae Hammock on 04/19/2020 17:00:25 -------------------------------------------------------------------------------- Lower Extremity Assessment Details Patient Name: Date of Service: [name] 04/19/2020 3:30 PM Medical Record Number: [id number] Patient Account Number: [id number] Date of Birth/Sex: Treating RN: 1960/07/28 (60 y.o. [name] Primary Care Thaniel Coluccio: Micheline Rough Other Clinician: Referring [name]: Treating Thresea Doble/Extender: Vaughan Basta Weeks in Treatment: 9 Edema Assessment Assessed: [Left: No] [Right: No] Edema: [Left: Ye] [Right:  s] Calf Left: Right: Point of Measurement: 35 cm From Medial Instep 39 cm Ankle Left: Right: Point of Measurement: 9 cm From Medial Instep 22.5 cm Vascular Assessment Pulses: Dorsalis Pedis Palpable: [Left:Yes] Electronic Signature(s) Signed: 04/20/2020 5:28:50 PM By: Levan Hurst RN, BSN Entered By: Levan Hurst on 04/19/2020 15:41:09 -------------------------------------------------------------------------------- [name] Details Patient Name: Date of Service: [name] Lunch, [name] 04/19/2020 3:30 PM Medical Record Number: [id number] Patient Account Number: [id number] Date of Birth/Sex: Treating RN: 11-Jul-1960 (60 y.o. [name] Primary Care [name]: Micheline Rough Other Clinician: Referring [name]: Treating [name]/Extender: Vaughan Basta Weeks in Treatment: 9 Active Inactive Venous  Leg Ulcer Nursing Diagnoses: Knowledge deficit related to disease process and management Potential for venous Insuffiency (use before diagnosis confirmed) Goals: Patient will maintain optimal edema control Date Initiated: 02/16/2020 Target Resolution Date: 05/17/2020 Goal Status: Active Interventions: Assess peripheral edema status every visit. Compression as ordered Provide education on venous insufficiency Treatment Activities: Therapeutic compression applied : 02/16/2020 Notes: Wound/Skin Impairment Nursing Diagnoses: Impaired tissue integrity Knowledge deficit related to ulceration/compromised skin integrity Goals: Patient/caregiver will verbalize understanding of skin care regimen Date Initiated: 02/16/2020 Target Resolution Date: 05/17/2020 Goal Status: Active Ulcer/skin breakdown will have a volume reduction of 30% by week 4 Date Initiated: 02/16/2020 Date Inactivated: 03/22/2020 Target Resolution Date: 03/15/2020 Goal Status: Met Ulcer/skin breakdown will have a volume reduction of 50% by week 8 Date Initiated:  03/22/2020 Date Inactivated: 04/19/2020 Target Resolution Date: 04/19/2020 Goal Status: Met Ulcer/skin breakdown will have a volume reduction of 80% by week 12 Date Initiated: 04/19/2020 Target Resolution Date: 05/17/2020 Goal Status: Active Interventions: Assess patient/caregiver ability to obtain necessary supplies Assess patient/caregiver ability to perform ulcer/skin care regimen upon admission and as needed Assess ulceration(s) every visit Provide education on ulcer and skin care Treatment Activities: Skin care regimen initiated : 02/16/2020 Topical wound management initiated : 02/16/2020 Notes: Electronic Signature(s) Signed: 04/19/2020 5:15:42 PM By: Baruch Gouty RN, BSN Entered By: Baruch Gouty on 04/19/2020 16:25:11 -------------------------------------------------------------------------------- Pain Assessment Details Patient Name: Date of Service: [name] Lunch, [place] 04/19/2020 3:30 PM Medical Record Number: [id number] Patient Account Number: [id number] Date of Birth/Sex: Treating RN: 1960-04-26 (60 y.o. [name] Primary Care Ronnae Kaser: Micheline Rough Other Clinician: Referring Renee Erb: Treating Kelle Ruppert/Extender: Vaughan Basta Weeks in Treatment: 9 Active Problems Location of Pain Severity and Description of Pain Patient Has Paino No Site Locations With Dressing Change: No Pain Management and Medication Current Pain Management: Electronic Signature(s) Signed: 04/19/2020 4:12:15 PM By: Mikeal Hawthorne EMT/HBOT/SD Signed: 04/19/2020 5:15:42 PM By: Baruch Gouty RN, BSN Entered By: Mikeal Hawthorne on 04/19/2020 15:48:54 -------------------------------------------------------------------------------- Patient/Caregiver Education Details Patient Name: Date of Service: [name] Lunch, [name] [name] 1/12/2022andnbsp3:30 PM Medical Record Number: [id number] Patient Account Number: [id number] Date of Birth/Gender: Treating RN: [birth year] (60 y.o. [name] Primary Care Physician: Micheline Rough Other Clinician: Referring Physician: Treating Physician/Extender: Stevie Kern in Treatment: 9 Education Assessment Education Provided To: Patient Education Topics Provided Venous: Methods: Explain/Verbal Responses: Reinforcements needed, State content correctly Electronic Signature(s) Signed: 04/19/2020 5:15:42 PM By: Baruch Gouty RN, BSN Entered By: Baruch Gouty on 04/19/2020 16:25:33 -------------------------------------------------------------------------------- Wound Assessment Details Patient Name: Date of Service: [name] 04/19/2020 3:30 PM Medical Record Number: [id number] Patient Account Number: [id number] Date of Birth/Sex: Treating RN: May 30, 1960 (60 y.o. [name] Primary Care Mussa Groesbeck: Micheline Rough Other Clinician: Referring Raylea Adcox: Treating Shiv Shuey/Extender: Vaughan Basta Weeks in Treatment: 9 Wound Status Wound Number: 1 Primary Venous Leg Ulcer Etiology: Wound Location: Left, Medial Lower Leg Wound Status: Open Wounding Event: Gradually Appeared Comorbid Anemia, Sleep Apnea, Hypertension, Peripheral Venous Date Acquired: 01/26/2020 History: Disease Weeks Of Treatment: 9 Clustered Wound: No Photos Photo Uploaded By: Mikeal Hawthorne on 04/20/2020 13:12:13 Wound Measurements Length: (cm) 1.2 Width: (cm) 0.5 Depth: (cm) 0.3 Area: (cm) 0.471 Volume: (cm) 0.141 % Reduction in Area: 86% % Reduction in Volume: 86.1% Epithelialization: Small (1-33%) Tunneling: No Undermining: No Wound Description Classification: Full Thickness Without Exposed Support Structures Wound Margin: Distinct, outline attached Exudate Amount: Medium Exudate Type: Serosanguineous Exudate Color: red, brown Foul Odor After Cleansing: No Slough/Fibrino  No Wound Bed Granulation Amount: Large (67-100%) Exposed Structure Granulation Quality:  Red Fascia Exposed: No Necrotic Amount: Small (1-33%) Fat Layer (Subcutaneous Tissue) Exposed: Yes Necrotic Quality: Adherent Slough Tendon Exposed: No Muscle Exposed: No Joint Exposed: No Bone Exposed: No Treatment Notes Wound #1 (Lower Leg) Wound Laterality: Left, Medial Cleanser Peri-Wound Care Sween Lotion (Moisturizing lotion) Discharge Instruction: Apply moisturizing lotion to leg Topical Primary Dressing Promogran Prisma Matrix, 4.34 (sq in) (silver collagen) Discharge Instruction: Moisten collagen with saline or hydrogel Secondary Dressing Woven Gauze Sponge, Non-Sterile 4x4 in Discharge Instruction: Apply over primary dressing as directed. Secured With Compression Wrap ThreePress (3 layer compression wrap) Discharge Instruction: Apply three layer compression as directed. Compression Stockings Add-Ons Electronic Signature(s) Signed: 04/19/2020 4:12:15 PM By: Mikeal Hawthorne EMT/HBOT/SD Signed: 04/19/2020 5:15:42 PM By: Baruch Gouty RN, BSN Entered By: Mikeal Hawthorne on 04/19/2020 15:47:07 -------------------------------------------------------------------------------- Vitals Details Patient Name: Date of Service: [name] Lunch, [place] 04/19/2020 3:30 PM Medical Record Number: [id number] Patient Account Number: [id number] Date of Birth/Sex: Treating RN: 17-Mar-1961 (60 y.o. [name] Primary Care Keaton Beichner: Micheline Rough Other Clinician: Referring Angel Weedon: Treating Feliciano Wynter/Extender: Vaughan Basta Weeks in Treatment: 9 Vital Signs Time Taken: 15:45 Temperature (F): 98.3 Height (in): 64 Pulse (bpm): 97 Weight (lbs): 278 Respiratory Rate (breaths/min): 18 Body Mass Index (BMI): 47.7 Blood Pressure (mmHg): 150/80 Reference Range: 80 - 120 mg / dl Electronic Signature(s) Signed: 04/19/2020 4:12:15 PM By: Mikeal Hawthorne EMT/HBOT/SD Entered By: Mikeal Hawthorne on 04/19/2020 15:48:38

## 2020-04-26 ENCOUNTER — Encounter (HOSPITAL_BASED_OUTPATIENT_CLINIC_OR_DEPARTMENT_OTHER): Payer: BC Managed Care – PPO | Admitting: Physician Assistant

## 2020-04-26 ENCOUNTER — Other Ambulatory Visit: Payer: Self-pay

## 2020-04-26 DIAGNOSIS — L97822 Non-pressure chronic ulcer of other part of left lower leg with fat layer exposed: Secondary | ICD-10-CM | POA: Diagnosis not present

## 2020-04-26 NOTE — Progress Notes (Addendum)
[name], Kayren ([id number]) Visit Report for 04/26/2020 Chief Complaint Document Details Patient Name: Date of Service: [name], SURA DA 04/26/2020 3:30 PM Medical Record Number: [id number] Patient Account Number: [id number] Date of Birth/Sex: Treating RN: 08/01/1960 (60 y.o. [name]) Boehlein, [name] Primary Care Provider: Theodis ShoveKoberlein, Junell Other Clinician: Referring Provider: Treating Provider/Extender: Esaw DaceStone III, Valynn Schamberger Koberlein, Junell Weeks in Treatment: 10 Information Obtained from: Patient Chief Complaint Left LE Ulcer Electronic Signature(s) Signed: 04/26/2020 3:55:26 PM By: Lenda KelpStone III, Jazma Pickel PA-C Entered By: Lenda KelpStone III, Nainoa Woldt on 04/26/2020 15:55:25 -------------------------------------------------------------------------------- HPI Details Patient Name: [name] 04/26/2020 3:30 PM Medical Record Number: [id number] Patient Account Number: [id number] Date of Birth/Sex: Treating RN: 01/02/1961 (60 y.o. [name]) Boehlein, [name] Primary Care Provider: Theodis ShoveKoberlein, Junell Other Clinician: Referring Provider: Treating Provider/Extender: Esaw DaceStone III, Aundre Hietala Koberlein, Junell Weeks in Treatment: 10 History of Present Illness HPI Description: 02/16/2020 upon evaluation today patient actually appears to be doing poorly in regard to her left medial lower extremity ulcer. This is actually an area that she tells me she has had intermittent issues with over the years although has been closed for some time she typically uses compression right now she has juxta lite compression wraps. With that being said she tells me that this nonetheless open several weeks/months ago and has been given her trouble since. She does have a history of chronic venous insufficiency she is seeing specialist for this in the past she has had an ablation as well as sclerotherapy. With that being said she also has hypertension chronically which is managed by her primary care provider. In general she seems to  be worsening overall with regard to the wound and states that she finally realized that she needed to come in and have somebody look at this and not continue to try to manage this on her own. No fevers, chills, nausea, vomiting, or diarrhea. 02/23/2020 on evaluation today patient appears to be doing well with regard to her wound. This is showing some signs of improvement which is great news still were not quite at the point where I would like to be as far as the overall appearance of the wound is concerned but I do believe this is better than last week. I do believe the Iodoflex is helping as well. 03/08/2020 upon evaluation today patient appears to be doing well with regard to her wound. She has been tolerating the dressing changes without complication. Fortunately I feel like she has made great progress with the Iodoflex but I feel like it may be the point rest to switch to something else possibly a collagen- based dressing at this time. 03/15/2020 upon evaluation today patient appears to be doing excellent in regard to her leg ulcer. She has been tolerating the dressing changes without complication. Fortunately there is no signs of active infection. Overall she is measuring a little bit smaller today which is great news. 03/22/2020 upon evaluation today patient appears to be doing well with regard to her wound. She has been tolerating the dressing changes without complication. Fortunately there is no signs of active infection at this time. 03/28/2020; patient I do not usually see however she has a wound on the left anterior lower leg secondary to chronic venous insufficiency we have been using silver collagen under compression. She arrives in clinic with a nonviable surface requiring debridement 04/12/2020 upon evaluation today patient appears to be doing well all things considered with regard to her leg ulcer. She is tolerating the dressing changes without complication there  is minimal dry skin  around the edges of the wound that may be trapping and stopping some of the events of the new skin I am can work on that today. Otherwise the surface of the wound appears to be doing excellent. 04/19/2020 upon evaluation today patient appears to be doing well with regard to her leg ulcer. She has been tolerating dressing changes without complication. Fortunately there is no signs of active infection at this time. No fever chills noted overall very pleased with how things seem to be progressing. 04/26/2020 on evaluation today patient appears to be doing well with regard to her wound currently. Is showing signs of excellent improvement overall is filling in nicely and there does not appear to be any signs of infection. No fevers, chills, nausea, vomiting, or diarrhea. Electronic Signature(s) Signed: 04/26/2020 5:06:25 PM By: Lenda Kelp PA-C Entered By: Lenda Kelp on 04/26/2020 17:06:24 -------------------------------------------------------------------------------- Physical Exam Details Patient Name: Date of Service: [place], [name] DA 04/26/2020 3:30 PM Medical Record Number: [id number] Patient Account Number: [id number] Date of Birth/Sex: Treating RN: [birth year] (60 y.o. [name] Standard Primary Care Provider: Theodis Shove Other Clinician: Referring Provider: Treating Provider/Extender: [name] in Treatment: 10 Constitutional Well-nourished and well-hydrated in no acute distress. Respiratory normal breathing without difficulty. Psychiatric this patient is able to make decisions and demonstrates good insight into disease process. Alert and Oriented x 3. pleasant and cooperative. Notes Upon inspection patient's wound bed did not require sharp debridement today which was an improvement in and of itself. Overall I think she is making excellent progress and in general. Electronic Signature(s) Signed: 04/26/2020 5:06:50 PM By: Lenda Kelp  PA-C Entered By: Lenda Kelp on 04/26/2020 17:06:50 -------------------------------------------------------------------------------- Physician Orders Details Patient Name: Date of Service: [place], [name] DA 04/26/2020 3:30 PM Medical Record Number: [id number] Patient Account Number: [id number] Date of Birth/Sex: Treating RN: [birth year] (60 y.o. [name] Standard Primary Care Provider: Theodis Shove Other Clinician: Referring Provider: Treating Provider/Extender: [name] in Treatment: 10 Verbal / Phone Orders: No Diagnosis Coding ICD-10 Coding Code Description I87.2 Venous insufficiency (chronic) (peripheral) L97.822 Non-pressure chronic ulcer of other part of left lower leg with fat layer exposed I10 Essential (primary) hypertension Follow-up Appointments Return Appointment in 1 week. Bathing/ Shower/ Hygiene May shower with protection but do not get wound dressing(s) wet. Edema Control - Lymphedema / SCD / Other Bilateral Lower Extremities Elevate legs to the level of the heart or above for 30 minutes daily and/or when sitting, a frequency of: - throughout the day Avoid standing for long periods of time. Exercise regularly Wound Treatment Wound #1 - Lower Leg Wound Laterality: Left, Medial Peri-Wound Care: Sween Lotion (Moisturizing lotion) 1 x Per Week Discharge Instructions: Apply moisturizing lotion to leg Prim Dressing: Promogran Prisma Matrix, 4.34 (sq in) (silver collagen) 1 x Per Week ary Discharge Instructions: Moisten collagen with saline or hydrogel Secondary Dressing: Woven Gauze Sponge, Non-Sterile 4x4 in 1 x Per Week Discharge Instructions: Apply over primary dressing as directed. Secondary Dressing: Drawtex 4x4 in 1 x Per Week Discharge Instructions: Apply over primary dressing cut to fit inside wound edges Compression Wrap: ThreePress (3 layer compression wrap) 1 x Per Week Discharge Instructions: Apply three layer  compression as directed. Electronic Signature(s) Signed: 04/26/2020 5:51:30 PM By: [name] RN, BSN Signed: 04/26/2020 6:05:28 PM By: Lenda Kelp PA-C Entered By: [name] on 04/26/2020 17:01:50 -------------------------------------------------------------------------------- Problem List Details Patient Name: Date of  Service: [place], [name] DA 04/26/2020 3:30 PM Medical Record Number: [id number] Patient Account Number: [id number] Date of Birth/Sex: Treating RN: 08-23-1960 (60 y.o. [name] Standard Primary Care Provider: Theodis Shove Other Clinician: Referring Provider: Treating Provider/Extender: [name] in Treatment: 10 Active Problems ICD-10 Encounter Code Description Active Date MDM Diagnosis I87.2 Venous insufficiency (chronic) (peripheral) 02/16/2020 No Yes L97.822 Non-pressure chronic ulcer of other part of left lower leg with fat layer 02/16/2020 No Yes exposed I10 Essential (primary) hypertension 02/16/2020 No Yes Inactive Problems Resolved Problems Electronic Signature(s) Signed: 04/26/2020 3:55:19 PM By: Lenda Kelp PA-C Entered By: Lenda Kelp on 04/26/2020 15:55:18 -------------------------------------------------------------------------------- Progress Note Details Patient Name: Date of Service: [place], [name] DA 04/26/2020 3:30 PM Medical Record Number: [id number] Patient Account Number: [id number] Date of Birth/Sex: Treating RN: 1961/04/04 (60 y.o. [name] Standard Primary Care Provider: Theodis Shove Other Clinician: Referring Provider: Treating Provider/Extender: [name] in Treatment: 10 Subjective Chief Complaint Information obtained from Patient Left LE Ulcer History of Present Illness (HPI) 02/16/2020 upon evaluation today patient actually appears to be doing poorly in regard to her left medial lower extremity ulcer. This is actually an area that  she tells me she has had intermittent issues with over the years although has been closed for some time she typically uses compression right now she has juxta lite compression wraps. With that being said she tells me that this nonetheless open several weeks/months ago and has been given her trouble since. She does have a history of chronic venous insufficiency she is seeing specialist for this in the past she has had an ablation as well as sclerotherapy. With that being said she also has hypertension chronically which is managed by her primary care provider. In general she seems to be worsening overall with regard to the wound and states that she finally realized that she needed to come in and have somebody look at this and not continue to try to manage this on her own. No fevers, chills, nausea, vomiting, or diarrhea. 02/23/2020 on evaluation today patient appears to be doing well with regard to her wound. This is showing some signs of improvement which is great news still were not quite at the point where I would like to be as far as the overall appearance of the wound is concerned but I do believe this is better than last week. I do believe the Iodoflex is helping as well. 03/08/2020 upon evaluation today patient appears to be doing well with regard to her wound. She has been tolerating the dressing changes without complication. Fortunately I feel like she has made great progress with the Iodoflex but I feel like it may be the point rest to switch to something else possibly a collagen- based dressing at this time. 03/15/2020 upon evaluation today patient appears to be doing excellent in regard to her leg ulcer. She has been tolerating the dressing changes without complication. Fortunately there is no signs of active infection. Overall she is measuring a little bit smaller today which is great news. 03/22/2020 upon evaluation today patient appears to be doing well with regard to her wound. She has  been tolerating the dressing changes without complication. Fortunately there is no signs of active infection at this time. 03/28/2020; patient I do not usually see however she has a wound on the left anterior lower leg secondary to chronic venous insufficiency we have been using silver collagen under compression. She arrives in clinic  with a nonviable surface requiring debridement 04/12/2020 upon evaluation today patient appears to be doing well all things considered with regard to her leg ulcer. She is tolerating the dressing changes without complication there is minimal dry skin around the edges of the wound that may be trapping and stopping some of the events of the new skin I am can work on that today. Otherwise the surface of the wound appears to be doing excellent. 04/19/2020 upon evaluation today patient appears to be doing well with regard to her leg ulcer. She has been tolerating dressing changes without complication. Fortunately there is no signs of active infection at this time. No fever chills noted overall very pleased with how things seem to be progressing. 04/26/2020 on evaluation today patient appears to be doing well with regard to her wound currently. Is showing signs of excellent improvement overall is filling in nicely and there does not appear to be any signs of infection. No fevers, chills, nausea, vomiting, or diarrhea. Objective Constitutional Well-nourished and well-hydrated in no acute distress. Vitals Time Taken: 4:27 PM, Height: 64 in, Weight: 278 lbs, BMI: 47.7, Temperature: 97.8 F, Pulse: 94 bpm, Respiratory Rate: 18 breaths/min, Blood Pressure: 131/81 mmHg. Respiratory normal breathing without difficulty. Psychiatric this patient is able to make decisions and demonstrates good insight into disease process. Alert and Oriented x 3. pleasant and cooperative. General Notes: Upon inspection patient's wound bed did not require sharp debridement today which was an  improvement in and of itself. Overall I think she is making excellent progress and in general. Integumentary (Hair, Skin) Wound #1 status is Open. Original cause of wound was Gradually Appeared. The wound is located on the Left,Medial Lower Leg. The wound measures 1.8cm length x 0.7cm width x 0.2cm depth; 0.99cm^2 area and 0.198cm^3 volume. There is Fat Layer (Subcutaneous Tissue) exposed. There is no tunneling or undermining noted. There is a medium amount of serosanguineous drainage noted. The wound margin is distinct with the outline attached to the wound base. There is large (67-100%) pink granulation within the wound bed. There is a small (1-33%) amount of necrotic tissue within the wound bed including Adherent Slough. Assessment Active Problems ICD-10 Venous insufficiency (chronic) (peripheral) Non-pressure chronic ulcer of other part of left lower leg with fat layer exposed Essential (primary) hypertension Procedures Wound #1 Pre-procedure diagnosis of Wound #1 is a Venous Leg Ulcer located on the Left,Medial Lower Leg . There was a Three Layer Compression Therapy Procedure by Fonnie Mu, RN. Post procedure Diagnosis Wound #1: Same as Pre-Procedure Plan Follow-up Appointments: Return Appointment in 1 week. Bathing/ Shower/ Hygiene: May shower with protection but do not get wound dressing(s) wet. Edema Control - Lymphedema / SCD / Other: Elevate legs to the level of the heart or above for 30 minutes daily and/or when sitting, a frequency of: - throughout the day Avoid standing for long periods of time. Exercise regularly WOUND #1: - Lower Leg Wound Laterality: Left, Medial Peri-Wound Care: Sween Lotion (Moisturizing lotion) 1 x Per Week/ Discharge Instructions: Apply moisturizing lotion to leg Prim Dressing: Promogran Prisma Matrix, 4.34 (sq in) (silver collagen) 1 x Per Week/ ary Discharge Instructions: Moisten collagen with saline or hydrogel Secondary Dressing: Woven  Gauze Sponge, Non-Sterile 4x4 in 1 x Per Week/ Discharge Instructions: Apply over primary dressing as directed. Secondary Dressing: Drawtex 4x4 in 1 x Per Week/ Discharge Instructions: Apply over primary dressing cut to fit inside wound edges Com pression Wrap: ThreePress (3 layer compression wrap) 1 x Per Week/  Discharge Instructions: Apply three layer compression as directed. 1. Would recommend currently that we go ahead and continue with the wound care measures as before specifically with regard to the collagen which I think is doing well we will use some drawtex behind this to help. 2. Also can recommend at this point the patient continue to utilize a 3 layer compression wrap that seems to be beneficial for her. 3. I am also going to suggest that we have the patient continue to elevate her legs much as possible try to keep edema under good control. We will see patient back for reevaluation in 1 week here in the clinic. If anything worsens or changes patient will contact our office for additional recommendations. Electronic Signature(s) Signed: 04/26/2020 5:52:13 PM By: Lenda KelpStone III, Marisa Hage PA-C Entered By: Lenda KelpStone III, Katelan Hirt on 04/26/2020 17:52:13 -------------------------------------------------------------------------------- SuperBill Details Patient Name: Date of Service: [name], [name] 04/26/2020 Medical Record Number: [id number] Patient Account Number: [id number] Date of Birth/Sex: Treating RN: 09/02/1960 (60 y.o. [name]) Boehlein, [name] Primary Care Provider: Theodis ShoveKoberlein, Junell Other Clinician: Referring Provider: Treating Provider/Extender: Esaw DaceStone III, Torianna Junio Koberlein, Junell Weeks in Treatment: 10 Diagnosis Coding ICD-10 Codes Code Description I87.2 Venous insufficiency (chronic) (peripheral) L97.822 Non-pressure chronic ulcer of other part of left lower leg with fat layer exposed I10 Essential (primary) hypertension Facility Procedures CPT4 Code: [id number] Description: (Facility Use  Only) [id number] - APPLY MULTLAY COMPRS LWR LT LEG Modifier: Quantity: 1 Physician Procedures : CPT4 Code Description Modifier [id number] - WC PHYS LEVEL 3 - EST PT ICD-10 Diagnosis Description I87.2 Venous insufficiency (chronic) (peripheral) L97.822 Non-pressure chronic ulcer of other part of left lower leg with fat layer exposed I10  Essential (primary) hypertension Quantity: 1 Electronic Signature(s) Signed: 04/26/2020 5:52:26 PM By: Lenda KelpStone III, Sissi Padia PA-C Previous Signature: 04/26/2020 5:51:30 PM Version By: Zenaida DeedBoehlein, [name] RN, BSN Entered By: Lenda KelpStone III, Jahmier Willadsen on 04/26/2020 17:52:25

## 2020-05-02 ENCOUNTER — Other Ambulatory Visit: Payer: Self-pay

## 2020-05-03 ENCOUNTER — Encounter: Payer: Self-pay | Admitting: Family Medicine

## 2020-05-03 ENCOUNTER — Encounter (HOSPITAL_BASED_OUTPATIENT_CLINIC_OR_DEPARTMENT_OTHER): Payer: BC Managed Care – PPO | Admitting: Physician Assistant

## 2020-05-03 ENCOUNTER — Ambulatory Visit: Payer: BC Managed Care – PPO | Admitting: Family Medicine

## 2020-05-03 VITALS — BP 120/72 | HR 61 | Temp 98.0°F | Ht 61.0 in | Wt 274.0 lb

## 2020-05-03 DIAGNOSIS — G4733 Obstructive sleep apnea (adult) (pediatric): Secondary | ICD-10-CM | POA: Diagnosis not present

## 2020-05-03 DIAGNOSIS — R32 Unspecified urinary incontinence: Secondary | ICD-10-CM | POA: Diagnosis not present

## 2020-05-03 DIAGNOSIS — I83029 Varicose veins of left lower extremity with ulcer of unspecified site: Secondary | ICD-10-CM

## 2020-05-03 DIAGNOSIS — D649 Anemia, unspecified: Secondary | ICD-10-CM

## 2020-05-03 DIAGNOSIS — L97929 Non-pressure chronic ulcer of unspecified part of left lower leg with unspecified severity: Secondary | ICD-10-CM

## 2020-05-03 DIAGNOSIS — Z23 Encounter for immunization: Secondary | ICD-10-CM | POA: Diagnosis not present

## 2020-05-03 DIAGNOSIS — L02416 Cutaneous abscess of left lower limb: Secondary | ICD-10-CM

## 2020-05-03 DIAGNOSIS — Z9989 Dependence on other enabling machines and devices: Secondary | ICD-10-CM

## 2020-05-03 DIAGNOSIS — E039 Hypothyroidism, unspecified: Secondary | ICD-10-CM

## 2020-05-03 DIAGNOSIS — I10 Essential (primary) hypertension: Secondary | ICD-10-CM | POA: Diagnosis not present

## 2020-05-03 DIAGNOSIS — Z1159 Encounter for screening for other viral diseases: Secondary | ICD-10-CM

## 2020-05-03 DIAGNOSIS — L539 Erythematous condition, unspecified: Secondary | ICD-10-CM

## 2020-05-03 DIAGNOSIS — L97822 Non-pressure chronic ulcer of other part of left lower leg with fat layer exposed: Secondary | ICD-10-CM | POA: Diagnosis not present

## 2020-05-03 MED ORDER — FLUCONAZOLE 150 MG PO TABS: 150.0000 mg | ORAL_TABLET | Freq: Once | ORAL | 0 refills | Status: AC

## 2020-05-03 MED ORDER — DOXYCYCLINE HYCLATE 100 MG PO TABS: 100.0000 mg | ORAL_TABLET | Freq: Two times a day (BID) | ORAL | 0 refills | Status: AC

## 2020-05-03 NOTE — Progress Notes (Addendum)
[name], [place] ([id number]) Visit Report for 05/03/2020 Chief Complaint Document Details Patient Name: Date of Service: [name], SURA DA 05/03/2020 12:45 PM Medical Record Number: [id number] Patient Account Number: [id number] Date of Birth/Sex: Treating RN: 03/19/1961 (60 y.o. [name]) Boehlein, [name] Primary Care Provider: Theodis ShoveKoberlein, Junell Other Clinician: Referring Provider: Treating Provider/Extender: Esaw DaceStone III, Aunisty Reali Koberlein, Junell Weeks in Treatment: 11 Information Obtained from: Patient Chief Complaint Left LE Ulcer Electronic Signature(s) Signed: 05/03/2020 1:20:04 PM By: Lenda KelpStone III, Rodric Punch PA-C Entered By: Lenda KelpStone III, Elnita Surprenant on 05/03/2020 13:20:03 -------------------------------------------------------------------------------- HPI Details Patient Name: Date of Service: [name], [name] 05/03/2020 12:45 PM Medical Record Number: [id number] Patient Account Number: [id number] Date of Birth/Sex: Treating RN: 03/18/1961 (60 y.o. [name]) Boehlein, [name] Primary Care Provider: Theodis ShoveKoberlein, Junell Other Clinician: Referring Provider: Treating Provider/Extender: Esaw DaceStone III, Salim Forero Koberlein, Junell Weeks in Treatment: 11 History of Present Illness HPI Description: 02/16/2020 upon evaluation today patient actually appears to be doing poorly in regard to her left medial lower extremity ulcer. This is actually an area that she tells me she has had intermittent issues with over the years although has been closed for some time she typically uses compression right now she has juxta lite compression wraps. With that being said she tells me that this nonetheless open several weeks/months ago and has been given her trouble since. She does have a history of chronic venous insufficiency she is seeing specialist for this in the past she has had an ablation as well as sclerotherapy. With that being said she also has hypertension chronically which is managed by her primary care provider. In general she seems to  be worsening overall with regard to the wound and states that she finally realized that she needed to come in and have somebody look at this and not continue to try to manage this on her own. No fevers, chills, nausea, vomiting, or diarrhea. 02/23/2020 on evaluation today patient appears to be doing well with regard to her wound. This is showing some signs of improvement which is great news still were not quite at the point where I would like to be as far as the overall appearance of the wound is concerned but I do believe this is better than last week. I do believe the Iodoflex is helping as well. 03/08/2020 upon evaluation today patient appears to be doing well with regard to her wound. She has been tolerating the dressing changes without complication. Fortunately I feel like she has made great progress with the Iodoflex but I feel like it may be the point rest to switch to something else possibly a collagen- based dressing at this time. 03/15/2020 upon evaluation today patient appears to be doing excellent in regard to her leg ulcer. She has been tolerating the dressing changes without complication. Fortunately there is no signs of active infection. Overall she is measuring a little bit smaller today which is great news. 03/22/2020 upon evaluation today patient appears to be doing well with regard to her wound. She has been tolerating the dressing changes without complication. Fortunately there is no signs of active infection at this time. 03/28/2020; patient I do not usually see however she has a wound on the left anterior lower leg secondary to chronic venous insufficiency we have been using silver collagen under compression. She arrives in clinic with a nonviable surface requiring debridement 04/12/2020 upon evaluation today patient appears to be doing well all things considered with regard to her leg ulcer. She is tolerating the dressing changes without complication there  is minimal dry skin  around the edges of the wound that may be trapping and stopping some of the events of the new skin I am can work on that today. Otherwise the surface of the wound appears to be doing excellent. 04/19/2020 upon evaluation today patient appears to be doing well with regard to her leg ulcer. She has been tolerating dressing changes without complication. Fortunately there is no signs of active infection at this time. No fever chills noted overall very pleased with how things seem to be progressing. 04/26/2020 on evaluation today patient appears to be doing well with regard to her wound currently. Is showing signs of excellent improvement overall is filling in nicely and there does not appear to be any signs of infection. No fevers, chills, nausea, vomiting, or diarrhea. 05/03/2020 upon evaluation today patient appears to be doing well with regard to her wound on the leg. This overall showing signs of good improvement which is great she has some good epithelial growth and overall I think that things are moving in the correct direction. We likewise going to continue with the wound care measures as before since she seems to making such good improvement. Electronic Signature(s) Signed: 05/03/2020 1:48:41 PM By: Lenda Kelp PA-C Entered By: Lenda Kelp on 05/03/2020 13:48:41 -------------------------------------------------------------------------------- Physical Exam Details Patient Name: Date of Service: [name] Azure DA 05/03/2020 12:45 PM Medical Record Number: [id number] Patient Account Number: [id number] Date of Birth/Sex: Treating RN: 04-10-1960 (60 y.o. [name] Standard Primary Care Provider: Theodis Shove Other Clinician: Referring Provider: Treating Provider/Extender: [name] in Treatment: 11 Constitutional Well-nourished and well-hydrated in no acute distress. Respiratory normal breathing without difficulty. Psychiatric this patient is able to  make decisions and demonstrates good insight into disease process. Alert and Oriented x 3. pleasant and cooperative. Notes Upon inspection patient's wound bed actually showed signs of good epithelization and granulation this is pretty much up to surface at this point and I do not see any evidence of infection overall I think that we are at the point where the new skin growth should hopefully increased fairly rapidly. Electronic Signature(s) Signed: 05/03/2020 1:48:59 PM By: Lenda Kelp PA-C Entered By: Lenda Kelp on 05/03/2020 13:48:59 -------------------------------------------------------------------------------- Physician Orders Details Patient Name: Date of Service: [place], [name] DA 05/03/2020 12:45 PM Medical Record Number: [id number] Patient Account Number: [id number] Date of Birth/Sex: Treating RN: October 10, 1960 (60 y.o. [name] Standard Primary Care Provider: Theodis Shove Other Clinician: Referring Provider: Treating Provider/Extender: [name] in Treatment: 86 Verbal / Phone Orders: No Diagnosis Coding ICD-10 Coding Code Description I87.2 Venous insufficiency (chronic) (peripheral) L97.822 Non-pressure chronic ulcer of other part of left lower leg with fat layer exposed I10 Essential (primary) hypertension Follow-up Appointments Return Appointment in 1 week. Bathing/ Shower/ Hygiene May shower with protection but do not get wound dressing(s) wet. Edema Control - Lymphedema / SCD / Other Bilateral Lower Extremities Elevate legs to the level of the heart or above for 30 minutes daily and/or when sitting, a frequency of: - throughout the day Avoid standing for long periods of time. Exercise regularly Wound Treatment Wound #1 - Lower Leg Wound Laterality: Left, Medial Peri-Wound Care: Sween Lotion (Moisturizing lotion) 1 x Per Week Discharge Instructions: Apply moisturizing lotion to leg Prim Dressing: Promogran Prisma Matrix, 4.34  (sq in) (silver collagen) 1 x Per Week ary Discharge Instructions: Moisten collagen with saline or hydrogel Secondary Dressing: Woven Gauze Sponge, Non-Sterile 4x4 in  1 x Per Week Discharge Instructions: Apply over primary dressing as directed. Secondary Dressing: Drawtex 4x4 in 1 x Per Week Discharge Instructions: Apply over primary dressing cut to fit inside wound edges Compression Wrap: ThreePress (3 layer compression wrap) 1 x Per Week Discharge Instructions: Apply three layer compression , use unna layer at top to secure wrap Electronic Signature(s) Signed: 05/03/2020 5:03:34 PM By: Lenda Kelp PA-C Signed: 05/03/2020 6:09:51 PM By: [name] RN, BSN Entered By: [name] on 05/03/2020 13:48:17 -------------------------------------------------------------------------------- Problem List Details Patient Name: Date of Service: [name], [name] DA 05/03/2020 12:45 PM Medical Record Number: [id number] Patient Account Number: [id number] Date of Birth/Sex: Treating RN: [birth year] (60 y.o. [name] Standard Primary Care Provider: Theodis Shove Other Clinician: Referring Provider: Treating Provider/Extender: [name] in Treatment: 11 Active Problems ICD-10 Encounter Code Description Active Date MDM Diagnosis I87.2 Venous insufficiency (chronic) (peripheral) 02/16/2020 No Yes L97.822 Non-pressure chronic ulcer of other part of left lower leg with fat layer 02/16/2020 No Yes exposed I10 Essential (primary) hypertension 02/16/2020 No Yes Inactive Problems Resolved Problems Electronic Signature(s) Signed: 05/03/2020 1:19:58 PM By: Lenda Kelp PA-C Entered By: Lenda Kelp on 05/03/2020 13:19:58 -------------------------------------------------------------------------------- Progress Note Details Patient Name: Date of Service: [place], [name] DA 05/03/2020 12:45 PM Medical Record Number: [id number] Patient Account Number:  [id number] Date of Birth/Sex: Treating RN: April 26, 1960 (60 y.o. [name] Standard Primary Care Provider: Theodis Shove Other Clinician: Referring Provider: Treating Provider/Extender: [name] in Treatment: 11 Subjective Chief Complaint Information obtained from Patient Left LE Ulcer History of Present Illness (HPI) 02/16/2020 upon evaluation today patient actually appears to be doing poorly in regard to her left medial lower extremity ulcer. This is actually an area that she tells me she has had intermittent issues with over the years although has been closed for some time she typically uses compression right now she has juxta lite compression wraps. With that being said she tells me that this nonetheless open several weeks/months ago and has been given her trouble since. She does have a history of chronic venous insufficiency she is seeing specialist for this in the past she has had an ablation as well as sclerotherapy. With that being said she also has hypertension chronically which is managed by her primary care provider. In general she seems to be worsening overall with regard to the wound and states that she finally realized that she needed to come in and have somebody look at this and not continue to try to manage this on her own. No fevers, chills, nausea, vomiting, or diarrhea. 02/23/2020 on evaluation today patient appears to be doing well with regard to her wound. This is showing some signs of improvement which is great news still were not quite at the point where I would like to be as far as the overall appearance of the wound is concerned but I do believe this is better than last week. I do believe the Iodoflex is helping as well. 03/08/2020 upon evaluation today patient appears to be doing well with regard to her wound. She has been tolerating the dressing changes without complication. Fortunately I feel like she has made great progress with the  Iodoflex but I feel like it may be the point rest to switch to something else possibly a collagen- based dressing at this time. 03/15/2020 upon evaluation today patient appears to be doing excellent in regard to her leg ulcer. She has been tolerating the dressing  changes without complication. Fortunately there is no signs of active infection. Overall she is measuring a little bit smaller today which is great news. 03/22/2020 upon evaluation today patient appears to be doing well with regard to her wound. She has been tolerating the dressing changes without complication. Fortunately there is no signs of active infection at this time. 03/28/2020; patient I do not usually see however she has a wound on the left anterior lower leg secondary to chronic venous insufficiency we have been using silver collagen under compression. She arrives in clinic with a nonviable surface requiring debridement 04/12/2020 upon evaluation today patient appears to be doing well all things considered with regard to her leg ulcer. She is tolerating the dressing changes without complication there is minimal dry skin around the edges of the wound that may be trapping and stopping some of the events of the new skin I am can work on that today. Otherwise the surface of the wound appears to be doing excellent. 04/19/2020 upon evaluation today patient appears to be doing well with regard to her leg ulcer. She has been tolerating dressing changes without complication. Fortunately there is no signs of active infection at this time. No fever chills noted overall very pleased with how things seem to be progressing. 04/26/2020 on evaluation today patient appears to be doing well with regard to her wound currently. Is showing signs of excellent improvement overall is filling in nicely and there does not appear to be any signs of infection. No fevers, chills, nausea, vomiting, or diarrhea. 05/03/2020 upon evaluation today patient appears to be  doing well with regard to her wound on the leg. This overall showing signs of good improvement which is great she has some good epithelial growth and overall I think that things are moving in the correct direction. We likewise going to continue with the wound care measures as before since she seems to making such good improvement. Objective Constitutional Well-nourished and well-hydrated in no acute distress. Vitals Time Taken: 1:09 PM, Height: 64 in, Source: Stated, Weight: 278 lbs, Source: Stated, BMI: 47.7, Temperature: 98.1 F, Pulse: 94 bpm, Respiratory Rate: 18 breaths/min, Blood Pressure: 116/76 mmHg. Respiratory normal breathing without difficulty. Psychiatric this patient is able to make decisions and demonstrates good insight into disease process. Alert and Oriented x 3. pleasant and cooperative. General Notes: Upon inspection patient's wound bed actually showed signs of good epithelization and granulation this is pretty much up to surface at this point and I do not see any evidence of infection overall I think that we are at the point where the new skin growth should hopefully increased fairly rapidly. Integumentary (Hair, Skin) Wound #1 status is Open. Original cause of wound was Gradually Appeared. The wound is located on the Left,Medial Lower Leg. The wound measures 1.7cm length x 0.6cm width x 0.1cm depth; 0.801cm^2 area and 0.08cm^3 volume. There is Fat Layer (Subcutaneous Tissue) exposed. There is no tunneling or undermining noted. There is a small amount of serosanguineous drainage noted. The wound margin is distinct with the outline attached to the wound base. There is large (67-100%) red, pink granulation within the wound bed. There is no necrotic tissue within the wound bed. Assessment Active Problems ICD-10 Venous insufficiency (chronic) (peripheral) Non-pressure chronic ulcer of other part of left lower leg with fat layer exposed Essential (primary)  hypertension Procedures Wound #1 Pre-procedure diagnosis of Wound #1 is a Venous Leg Ulcer located on the Left,Medial Lower Leg . There was a Three Layer Compression  Therapy Procedure by Fonnie Mu, RN. Post procedure Diagnosis Wound #1: Same as Pre-Procedure Plan Follow-up Appointments: Return Appointment in 1 week. Bathing/ Shower/ Hygiene: May shower with protection but do not get wound dressing(s) wet. Edema Control - Lymphedema / SCD / Other: Elevate legs to the level of the heart or above for 30 minutes daily and/or when sitting, a frequency of: - throughout the day Avoid standing for long periods of time. Exercise regularly WOUND #1: - Lower Leg Wound Laterality: Left, Medial Peri-Wound Care: Sween Lotion (Moisturizing lotion) 1 x Per Week/ Discharge Instructions: Apply moisturizing lotion to leg Prim Dressing: Promogran Prisma Matrix, 4.34 (sq in) (silver collagen) 1 x Per Week/ ary Discharge Instructions: Moisten collagen with saline or hydrogel Secondary Dressing: Woven Gauze Sponge, Non-Sterile 4x4 in 1 x Per Week/ Discharge Instructions: Apply over primary dressing as directed. Secondary Dressing: Drawtex 4x4 in 1 x Per Week/ Discharge Instructions: Apply over primary dressing cut to fit inside wound edges Com pression Wrap: ThreePress (3 layer compression wrap) 1 x Per Week/ Discharge Instructions: Apply three layer compression , use unna layer at top to secure wrap 1. Would recommend currently that we continue with the wound care measures as before and the patient is in agreement with the plan that includes the use of collagen followed by drawtex in order to hold in place. With that being said I think this is done an excellent job and I see no evidence of any trauma whatsoever. I think she is doing well Seletz keep with this plan. 2. I am also can recommend we continue with 3 layer compression wrap that is keeping her edema under good control. We will see patient  back for reevaluation in 1 week here in the clinic. If anything worsens or changes patient will contact our office for additional recommendations. Electronic Signature(s) Signed: 05/03/2020 1:49:27 PM By: Lenda Kelp PA-C Entered By: Lenda Kelp on 05/03/2020 13:49:27 -------------------------------------------------------------------------------- SuperBill Details Patient Name: Date of Service: [place], [name] DA 05/03/2020 Medical Record Number: [id number] Patient Account Number: [id number] Date of Birth/Sex: Treating RN: [birth year] (60 y.o. [name] Standard Primary Care Provider: Theodis Shove Other Clinician: Referring Provider: Treating Provider/Extender: [name] in Treatment: 11 Diagnosis Coding ICD-10 Codes Code Description I87.2 Venous insufficiency (chronic) (peripheral) L97.822 Non-pressure chronic ulcer of other part of left lower leg with fat layer exposed I10 Essential (primary) hypertension Facility Procedures CPT4 Code: [id number] Description: (Facility Use Only) [phone] - APPLY MULTLAY COMPRS LWR LT LEG Modifier: Quantity: 1 Physician Procedures : CPT4 Code Description Modifier [id number] - WC PHYS LEVEL 3 - EST PT ICD-10 Diagnosis Description I87.2 Venous insufficiency (chronic) (peripheral) L97.822 Non-pressure chronic ulcer of other part of left lower leg with fat layer exposed I10  Essential (primary) hypertension Quantity: 1 Electronic Signature(s) Signed: 05/03/2020 1:49:37 PM By: Lenda Kelp PA-C Entered By: Lenda Kelp on 05/03/2020 13:49:37

## 2020-05-03 NOTE — Patient Instructions (Addendum)
[email or web address] to schedule covid booster  Kegel Exercises  Kegel exercises can help strengthen your pelvic floor muscles. The pelvic floor is a group of muscles that support your rectum, small intestine, and bladder. In females, pelvic floor muscles also help support the womb (uterus). These muscles help you control the flow of urine and stool. Kegel exercises are painless and simple, and they do not require any equipment. Your provider may suggest Kegel exercises to:  Improve bladder and bowel control.  Improve sexual response.  Improve weak pelvic floor muscles after surgery to remove the uterus (hysterectomy) or pregnancy (females).  Improve weak pelvic floor muscles after prostate gland removal or surgery (males). Kegel exercises involve squeezing your pelvic floor muscles, which are the same muscles you squeeze when you try to stop the flow of urine or keep from passing gas. The exercises can be done while sitting, standing, or lying down, but it is best to vary your position. Exercises How to do Kegel exercises: 1. Squeeze your pelvic floor muscles tight. You should feel a tight lift in your rectal area. If you are a female, you should also feel a tightness in your vaginal area. Keep your stomach, buttocks, and legs relaxed. 2. Hold the muscles tight for up to 10 seconds. 3. Breathe normally. 4. Relax your muscles. 5. Repeat as told by your health care provider. Repeat this exercise daily as told by your health care provider. Continue to do this exercise for at least 4-6 weeks, or for as long as told by your health care provider. You may be referred to a physical therapist who can help you learn more about how to do Kegel exercises. Depending on your condition, your health care provider may recommend:  Varying how long you squeeze your muscles.  Doing several sets of exercises every day.  Doing exercises for several weeks.  Making Kegel exercises a part of your regular  exercise routine. This information is not intended to replace advice given to you by your health care provider. Make sure you discuss any questions you have with your health care provider. Document Revised: 07/30/2019 Document Reviewed: 11/12/2017 Elsevier Patient Education  2021 [employer].

## 2020-05-03 NOTE — Progress Notes (Signed)
[name], [name] ([id number]) Visit Report for 05/03/2020 Arrival Information Details Patient Name: Date of Service: [place], [place] 05/03/2020 12:45 PM Medical Record Number: [id number] Patient Account Number: [id number] Date of Birth/Sex: Treating RN: January 11, 1961 (60 y.o. [name], [name] Primary Care [name]: [name] Other Clinician: Referring [name]: Treating [name]/Extender: [name] in Treatment: 11 Visit Information History Since Last Visit Added or deleted any medications: No Patient Arrived: Ambulatory Any new allergies or adverse reactions: No Arrival Time: 13:07 Had a fall or experienced change in No Accompanied By: spouse activities of daily living that may affect Transfer Assistance: None risk of falls: Patient Identification Verified: Yes Signs or symptoms of abuse/neglect since last visito No Secondary Verification Process Completed: Yes Hospitalized since last visit: No Patient Requires Transmission-Based Precautions: No Implantable device outside of the clinic excluding No Patient Has Alerts: Yes cellular tissue based products placed in the center Patient Alerts: ABI noncompressible since last visit: Has Dressing in Place as Prescribed: Yes Has Compression in Place as Prescribed: Yes Pain Present Now: No Electronic Signature(s) Signed: 05/03/2020 6:09:51 PM By: [name] Gouty RN, BSN Entered By: [name] on 05/03/2020 13:07:41 -------------------------------------------------------------------------------- Compression Therapy Details Patient Name: Date of Service: [name], [name] [place] 05/03/2020 12:45 PM Medical Record Number: [id number] Patient Account Number: [id number] Date of Birth/Sex: Treating RN: Aug 29, 1960 (60 y.o. [name] Primary Care Eithen Castiglia: [name] Other Clinician: Referring [name]: Treating [name]/Extender: [name] in Treatment:  11 Compression Therapy Performed for Wound Assessment: Wound #1 Left,Medial Lower Leg Performed By: Clinician [name] Hammock, RN Compression Type: Three Layer Post Procedure Diagnosis Same as Pre-procedure Electronic Signature(s) Signed: 05/03/2020 6:09:51 PM By: [name] Gouty RN, BSN Entered By: [name] on 05/03/2020 13:46:47 -------------------------------------------------------------------------------- Encounter Discharge Information Details Patient Name: Date of Service: [name], [name] 05/03/2020 12:45 PM Medical Record Number: [id number] Patient Account Number: [id number] Date of Birth/Sex: Treating RN: 1960/04/12 (60 y.o. [name] Primary Care [name] [name]: [name] Other Clinician: Referring Naveena Eyman: Treating Jashua Knaak/Extender: [name] in Treatment: 11 Encounter Discharge Information Items Discharge Condition: Stable Ambulatory Status: Ambulatory Discharge Destination: Home Transportation: Private Auto Accompanied By: self Schedule Follow-up Appointment: Yes Clinical Summary of Care: Patient Declined Electronic Signature(s) Signed: 05/03/2020 5:56:51 PM By: [name] Hammock RN Entered By: [name] on 05/03/2020 14:09:30 -------------------------------------------------------------------------------- Lower Extremity Assessment Details Patient Name: Date of Service: [name], [name] 05/03/2020 12:45 PM Medical Record Number: [id number] Patient Account Number: [id number] Date of Birth/Sex: Treating RN: [birth year] (60 y.o. [name] Primary Care Hikeem Andersson: [name] Other Clinician: Referring Alyna Stensland: Treating Liani Caris/Extender: [name] in Treatment: 11 Edema Assessment Assessed: [Left: No] [Right: No] Edema: [Left: Ye] [Right: s] Calf Left: Right: Point of Measurement: 35 cm From Medial Instep 38.4 cm Ankle Left: Right: Point of  Measurement: 9 cm From Medial Instep 23.4 cm Vascular Assessment Pulses: Dorsalis Pedis Palpable: [Left:Yes] Electronic Signature(s) Signed: 05/03/2020 6:09:51 PM By: [name] Gouty RN, BSN Entered By: [name] on 05/03/2020 13:15:11 -------------------------------------------------------------------------------- [place] Details Patient Name: Date of Service: [name], [name] 05/03/2020 12:45 PM Medical Record Number: [id number] Patient Account Number: [id number] Date of Birth/Sex: Treating RN: 1960/06/26 (60 y.o. [name] Primary Care Yuriy Cui: [name] Other Clinician: Referring Amesha Bailey: Treating Atari Novick/Extender: [name] in Treatment: 11 Active Inactive Venous Leg Ulcer Nursing Diagnoses: Knowledge deficit related to disease process and management Potential for venous Insuffiency (use  before diagnosis confirmed) Goals: Patient will maintain optimal edema control Date Initiated: 02/16/2020 Target Resolution Date: 05/17/2020 Goal Status: Active Interventions: Assess peripheral edema status every visit. Compression as ordered Provide education on venous insufficiency Treatment Activities: Therapeutic compression applied : 02/16/2020 Notes: Wound/Skin Impairment Nursing Diagnoses: Impaired tissue integrity Knowledge deficit related to ulceration/compromised skin integrity Goals: Patient/caregiver will verbalize understanding of skin care regimen Date Initiated: 02/16/2020 Target Resolution Date: 05/17/2020 Goal Status: Active Ulcer/skin breakdown will have a volume reduction of 30% by week 4 Date Initiated: 02/16/2020 Date Inactivated: 03/22/2020 Target Resolution Date: 03/15/2020 Goal Status: Met Ulcer/skin breakdown will have a volume reduction of 50% by week 8 Date Initiated: 03/22/2020 Date Inactivated: 04/19/2020 Target Resolution Date: 04/19/2020 Goal Status: Met Ulcer/skin  breakdown will have a volume reduction of 80% by week 12 Date Initiated: 04/19/2020 Target Resolution Date: 05/17/2020 Goal Status: Active Interventions: Assess patient/caregiver ability to obtain necessary supplies Assess patient/caregiver ability to perform ulcer/skin care regimen upon admission and as needed Assess ulceration(s) every visit Provide education on ulcer and skin care Treatment Activities: Skin care regimen initiated : 02/16/2020 Topical wound management initiated : 02/16/2020 Notes: Electronic Signature(s) Signed: 05/03/2020 6:09:51 PM By: [name] Gouty RN, BSN Entered By: [name] on 05/03/2020 13:17:18 -------------------------------------------------------------------------------- Pain Assessment Details Patient Name: Date of Service: [name], [place] 05/03/2020 12:45 PM Medical Record Number: [id number] Patient Account Number: [id number] Date of Birth/Sex: Treating RN: 1960-11-03 (60 y.o. [name] Primary Care Emmry Hinsch: [name] Other Clinician: Referring Ahlivia Salahuddin: Treating Casper Pagliuca/Extender: [name] in Treatment: 11 Active Problems Location of Pain Severity and Description of Pain Patient Has Paino No Site Locations Rate the pain. Current Pain Level: 0 Pain Management and Medication Current Pain Management: Electronic Signature(s) Signed: 05/03/2020 6:09:51 PM By: [name] Gouty RN, BSN Entered By: [name] on 05/03/2020 13:10:05 -------------------------------------------------------------------------------- Patient/Caregiver Education Details Patient Name: Date of Service: [name], [name] DA 1/26/2022andnbsp12:45 PM Medical Record Number: [id number] Patient Account Number: [id number] Date of Birth/Gender: Treating RN: 08/28/1960 (60 y.o. [name] Primary Care Physician: [name] Other Clinician: Referring Physician: Treating Physician/Extender: Stevie Kern in Treatment: 11 Education Assessment Education Provided To: Patient Education Topics Provided Venous: Methods: Explain/Verbal Responses: Reinforcements needed, State content correctly Wound/Skin Impairment: Methods: Explain/Verbal Responses: Reinforcements needed, State content correctly Electronic Signature(s) Signed: 05/03/2020 6:09:51 PM By: [name] Gouty RN, BSN Entered By: [name] on 05/03/2020 13:18:14 -------------------------------------------------------------------------------- Wound Assessment Details Patient Name: Date of Service: [place], [place] 05/03/2020 12:45 PM Medical Record Number: [id number] Patient Account Number: [id number] Date of Birth/Sex: Treating RN: [birth year] (60 y.o. [name] Primary Care Ellene Bloodsaw: [name] Other Clinician: Referring Abdinasir Spadafore: Treating Tate Zagal/Extender: [name] in Treatment: 11 Wound Status Wound Number: 1 Primary Venous Leg Ulcer Etiology: Wound Location: Left, Medial Lower Leg Wound Status: Open Wounding Event: Gradually Appeared Comorbid Anemia, Sleep Apnea, Hypertension, Peripheral Venous Date Acquired: 01/26/2020 History: Disease [name] Of Treatment: 11 Clustered Wound: No Wound Measurements Length: (cm) 1.7 Width: (cm) 0.6 Depth: (cm) 0.1 Area: (cm) 0.801 Volume: (cm) 0.08 % Reduction in Area: 76.2% % Reduction in Volume: 92.1% Epithelialization: Small (1-33%) Tunneling: No Undermining: No Wound Description Classification: Full Thickness Without Exposed Support Structures Wound Margin: Distinct, outline attached Exudate Amount: Small Exudate Type: Serosanguineous Exudate Color: red, brown Foul Odor After Cleansing: No Slough/Fibrino Yes Wound Bed Granulation Amount: Large (67-100%) Exposed Structure Granulation Quality: Red, Pink Fascia Exposed: No Necrotic Amount: None Present (0%)  Fat Layer (Subcutaneous  Tissue) Exposed: Yes Tendon Exposed: No Muscle Exposed: No Joint Exposed: No Bone Exposed: No Treatment Notes Wound #1 (Lower Leg) Wound Laterality: Left, Medial Cleanser Peri-Wound Care Sween Lotion (Moisturizing lotion) Discharge Instruction: Apply moisturizing lotion to leg Topical Primary Dressing Promogran Prisma Matrix, 4.34 (sq in) (silver collagen) Discharge Instruction: Moisten collagen with saline or hydrogel Secondary Dressing Woven Gauze Sponge, Non-Sterile 4x4 in Discharge Instruction: Apply over primary dressing as directed. Drawtex 4x4 in Discharge Instruction: Apply over primary dressing cut to fit inside wound edges Secured With Compression Wrap ThreePress (3 layer compression wrap) Discharge Instruction: Apply three layer compression , use unna layer at top to secure wrap Compression Stockings Add-Ons Electronic Signature(s) Signed: 05/03/2020 6:09:51 PM By: [name] Gouty RN, BSN Entered By: [name] on 05/03/2020 13:16:19 -------------------------------------------------------------------------------- [place] Details Patient Name: Date of Service: [name], [place] 05/03/2020 12:45 PM Medical Record Number: [id number] Patient Account Number: [id number] Date of Birth/Sex: Treating RN: 02-10-1961 (60 y.o. [name] Primary Care Tifanie Gardiner: [name] Other Clinician: Referring Miarose Lippert: Treating Tyan Lasure/Extender: [name] in Treatment: 11 Vital Signs Time Taken: 13:09 Temperature (F): 98.1 Height (in): 64 Pulse (bpm): 94 Source: Stated Respiratory Rate (breaths/min): 18 Weight (lbs): 278 Blood Pressure (mmHg): 116/76 Source: Stated Reference Range: 80 - 120 mg / dl Body Mass Index (BMI): 47.7 Electronic Signature(s) Signed: 05/03/2020 6:09:51 PM By: [name] Gouty RN, BSN Entered By: [name] on 05/03/2020 13:09:54

## 2020-05-03 NOTE — Progress Notes (Signed)
[name] DOB: [birth year] Encounter date: 05/03/2020  This is a 60 y.o. female who presents with Chief Complaint  Patient presents with  . Follow-up    History of present illness: Few concerns today: -wants to get in with pulmonary doc again. Needs supplies and feels she should have machine checked. Mouth very dry with waking up. Does have humidifier. Not sure if change is needed with machine/settings.   -having some incontinence. Has had urge incontinence. Sometimes not getting break at work to get to bathroom regularly - has had to go to wearing liner due to this. Usually will wake up max of once at night. No burning or discomfort with urination. Does feel like she empties completely, but sometimes feels she has to go a little more shortly after.   - has been in wound care. Has had ulcer and getting treatment for this on left lower leg. States healing nicely and getting to better stage.   -has noticed that she gets tender to touch anterior lower right leg - tender to bone. Wearing compression stocking daily. Nothing has changed with fit. No skin changes on leg. Sometimes itchy; goes away with benadryl and lotion. No radiation of pain into toe/foot. Remote hx of wound in area of tenderness. Keeps some swelling, but well controlled with her compression. Normal in morning.   -right knee pain comes and goes. Working on weight loss and has lost 25lbs. Watching what she is eating, been busy at work. Limiting portions. Trying to eat healthier. Did have cortisone shot in knee which helped for a little while. She ordered brace but it is very large/she hasn't felt it was worthwhile to wear it.   Had covid vaccination in feb/march of last year.     Allergies  Allergen Reactions  . Ivp Dye [Iodinated Diagnostic Agents]     Hives, hot  . Sulfa Antibiotics     Hives, hot flashes   Current Meds  Medication Sig  . acetaminophen (TYLENOL) 500 MG tablet Take 500 mg by mouth as needed.  .  betamethasone dipropionate 0.05 % cream Apply topically 2 (two) times daily.  . Calcium Carb-Cholecalciferol (CALCIUM 600 + D) 600-200 MG-UNIT TABS Take 1 tablet by mouth daily.  . diphenhydrAMINE (BENADRYL) 25 MG tablet Take 25 mg by mouth as needed.  . diphenhydramine-acetaminophen (TYLENOL PM) 25-500 MG TABS tablet Take 1 tablet by mouth at bedtime as needed.  Marland Kitchen ELDERBERRY PO Take by mouth.  . EUTHYROX 175 MCG tablet TAKE 1 TABLET BY MOUTH ONCE DAILY BEFORE BREAKFAST  . famotidine (PEPCID) 20 MG tablet Take 20 mg by mouth daily as needed for heartburn or indigestion.  . Ferrous Sulfate Dried (FERROUS SULFATE IRON) 200 (65 Fe) MG TABS Take 1 tablet by mouth daily.  . hydrochlorothiazide (HYDRODIURIL) 50 MG tablet Take 1 tablet by mouth once daily  . losartan (COZAAR) 100 MG tablet TAKE 1 TABLET BY MOUTH ONCE DAILY . APPOINTMENT REQUIRED FOR FUTURE REFILLS  . Multiple Vitamins-Minerals (ONE-A-DAY MENOPAUSE FORMULA) TABS Take 1 tablet by mouth daily.  . naproxen sodium (ALEVE) 220 MG tablet Take 1 tablet (220 mg total) by mouth 2 (two) times daily as needed. (Patient taking differently: Take 440 mg by mouth daily as needed.)  . OVER THE COUNTER MEDICATION OTC antacid as needed (cannot recall name)  . vitamin B-12 (CYANOCOBALAMIN) 1000 MCG tablet Take 1,000 mcg by mouth daily.    Review of Systems  Constitutional: Negative for chills, fatigue and fever.  Respiratory: Negative for cough,  chest tightness, shortness of breath and wheezing.   Cardiovascular: Negative for chest pain, palpitations and leg swelling.    Objective:  BP 120/72 (BP Location: Left Arm, Patient Position: Sitting, Cuff Size: Large)   Pulse 61   Temp 98 F (36.7 C) (Oral)   Ht 5\' 1"  (1.549 m)   Wt 274 lb (124.3 kg)   BMI 51.77 kg/m   Weight: 274 lb (124.3 kg)   BP Readings from Last 3 Encounters:  05/03/20 120/72  02/01/20 130/80  09/22/19 120/80   Wt Readings from Last 3 Encounters:  05/03/20 274 lb (124.3  kg)  02/01/20 280 lb 8 oz (127.2 kg)  09/22/19 299 lb (135.6 kg)    Physical Exam Constitutional:      General: She is not in acute distress.    Appearance: She is well-developed. She is obese.  Cardiovascular:     Rate and Rhythm: Normal rate and regular rhythm.     Heart sounds: Normal heart sounds. No murmur heard. No friction rub.  Pulmonary:     Effort: Pulmonary effort is normal. No respiratory distress.     Breath sounds: Normal breath sounds. No wheezing or rales.  Musculoskeletal:     Right lower leg: No edema.     Left lower leg: No edema.  Skin:         Comments: Approximate 2 cm area of erythema right medial leg.  This occurs right on the edge of her leg wrap.  Uncertain if secondary to this or if early cellulitis.  Neurological:     Mental Status: She is alert and oriented to person, place, and time.  Psychiatric:        Behavior: Behavior normal.     Assessment/Plan  1. OSA on CPAP She is in need of new equipment and possibly titration. - Ambulatory referral to Pulmonology  2. Hypertension, unspecified type Well-controlled.  Continue hydrochlorothiazide 50 mg and losartan 100 mg. - CBC with Differential/Platelet; Future - Comprehensive metabolic panel; Future - Comprehensive metabolic panel - CBC with Differential/Platelet  3. Urinary incontinence, unspecified type Checking urine today. - Urinalysis with Culture Reflex  5. Venous ulcer of left leg (HCC) She is following with wound clinic and is getting good results with healing.  6. Erythema of skin I am going to print an antibiotic for her in case of worsening erythema of her leg.  I am uncertain if this is an early cellulitis or just irritation from her regular leg wrapping.  She will let me know if she needs to start antibiotic and if any worsening. - doxycycline (VIBRA-TABS) 100 MG tablet; Take 1 tablet (100 mg total) by mouth 2 (two) times daily for 7 days.  Dispense: 14 tablet; Refill: 0 -  fluconazole (DIFLUCAN) 150 MG tablet; Take 1 tablet (150 mg total) by mouth once for 1 dose.  Dispense: 1 tablet; Refill: 0  7. Anemia, unspecified type - Iron, TIBC and Ferritin Panel; Future - Iron, TIBC and Ferritin Panel  8. Hypothyroidism, unspecified type Continue levothyroxine 175 mcg daily.  9. Encounter for hepatitis C screening test for low risk patient - Hepatitis C antibody; Future - Hepatitis C antibody  10. Need for immunization against influenza - Flu Vaccine QUAD 6+ mos PF IM (Fluarix Quad PF)    Return in about 6 months (around 10/31/2020) for physical exam. 49 minutes spent discussion with patient about chronic health conditions, chart review, exam, charting.  She is doing a great job with weight loss and  encouraged her to keep this up.   Theodis Shove, MD

## 2020-05-04 LAB — COMPREHENSIVE METABOLIC PANEL
ALT: 19 U/L (ref 0–35)
AST: 25 U/L (ref 0–37)
Albumin: 4.2 g/dL (ref 3.5–5.2)
Alkaline Phosphatase: 73 U/L (ref 39–117)
BUN: 20 mg/dL (ref 6–23)
CO2: 32 mEq/L (ref 19–32)
Calcium: 10 mg/dL (ref 8.4–10.5)
Chloride: 102 mEq/L (ref 96–112)
Creatinine, Ser: 1.07 mg/dL (ref 0.40–1.20)
GFR: 56.14 mL/min — ABNORMAL LOW (ref >60.00)
Glucose, Bld: 87 mg/dL (ref 70–99)
Potassium: 4 mEq/L (ref 3.5–5.1)
Sodium: 140 mEq/L (ref 135–145)
Total Bilirubin: 0.4 mg/dL (ref 0.2–1.2)
Total Protein: 7.5 g/dL (ref 6.0–8.3)

## 2020-05-04 LAB — URINALYSIS W MICROSCOPIC + REFLEX CULTURE
Bacteria, UA: NONE SEEN /HPF
Bilirubin Urine: NEGATIVE
Glucose, UA: NEGATIVE
Hgb urine dipstick: NEGATIVE
Hyaline Cast: NONE SEEN /LPF
Ketones, ur: NEGATIVE
Leukocyte Esterase: NEGATIVE
Nitrites, Initial: NEGATIVE
Protein, ur: NEGATIVE
RBC / HPF: NONE SEEN /HPF (ref 0–2)
Specific Gravity, Urine: 1.017 (ref 1.001–1.03)
Squamous Epithelial / HPF: NONE SEEN /HPF (ref <=5)
WBC, UA: NONE SEEN /HPF (ref 0–5)
pH: 5.5 (ref 5.0–8.0)

## 2020-05-04 LAB — HEPATITIS C ANTIBODY
Hepatitis C Ab: NONREACTIVE
SIGNAL TO CUT-OFF: 0.02 (ref <1.00)

## 2020-05-04 LAB — CBC WITH DIFFERENTIAL/PLATELET
Basophils Absolute: 0.1 10*3/uL (ref 0.0–0.1)
Basophils Relative: 0.7 % (ref 0.0–3.0)
Eosinophils Absolute: 0.2 10*3/uL (ref 0.0–0.7)
Eosinophils Relative: 2.3 % (ref 0.0–5.0)
HCT: 33.7 % — ABNORMAL LOW (ref 36.0–46.0)
Hemoglobin: 11.6 g/dL — ABNORMAL LOW (ref 12.0–15.0)
Lymphocytes Relative: 24.5 % (ref 12.0–46.0)
Lymphs Abs: 1.9 10*3/uL (ref 0.7–4.0)
MCHC: 34.4 g/dL (ref 30.0–36.0)
MCV: 94.3 fl (ref 78.0–100.0)
Monocytes Absolute: 0.5 10*3/uL (ref 0.1–1.0)
Monocytes Relative: 6.6 % (ref 3.0–12.0)
Neutro Abs: 5.1 10*3/uL (ref 1.4–7.7)
Neutrophils Relative %: 65.9 % (ref 43.0–77.0)
Platelets: 189 10*3/uL (ref 150.0–400.0)
RBC: 3.58 Mil/uL — ABNORMAL LOW (ref 3.87–5.11)
RDW: 13.1 % (ref 11.5–15.5)
WBC: 7.7 10*3/uL (ref 4.0–10.5)

## 2020-05-04 LAB — IRON,TIBC AND FERRITIN PANEL
%SAT: 23 % (calc) (ref 16–45)
Ferritin: 251 ng/mL (ref 16–288)
Iron: 73 ug/dL (ref 45–160)
TIBC: 324 mcg/dL (calc) (ref 250–450)

## 2020-05-04 LAB — NO CULTURE INDICATED

## 2020-05-10 ENCOUNTER — Encounter (HOSPITAL_BASED_OUTPATIENT_CLINIC_OR_DEPARTMENT_OTHER): Payer: BC Managed Care – PPO | Admitting: Physician Assistant

## 2020-05-10 ENCOUNTER — Ambulatory Visit: Payer: BC Managed Care – PPO

## 2020-05-11 ENCOUNTER — Other Ambulatory Visit: Payer: Self-pay

## 2020-05-11 ENCOUNTER — Encounter (HOSPITAL_BASED_OUTPATIENT_CLINIC_OR_DEPARTMENT_OTHER): Payer: BC Managed Care – PPO | Attending: Physician Assistant | Admitting: Internal Medicine

## 2020-05-11 DIAGNOSIS — I872 Venous insufficiency (chronic) (peripheral): Secondary | ICD-10-CM | POA: Insufficient documentation

## 2020-05-11 DIAGNOSIS — I10 Essential (primary) hypertension: Secondary | ICD-10-CM | POA: Insufficient documentation

## 2020-05-11 DIAGNOSIS — L97822 Non-pressure chronic ulcer of other part of left lower leg with fat layer exposed: Secondary | ICD-10-CM | POA: Diagnosis not present

## 2020-05-11 NOTE — Progress Notes (Signed)
SIRIAH, TREAT ([id number]) Visit Report for 05/11/2020 HPI Details Patient Name: Date of Service: [name] [name] 05/11/2020 8:00 A M Medical Record Number: [id number] Patient Account Number: [id number] Date of Birth/Sex: Treating RN: 11-13-1960 (60 y.o. [name] Primary Care Provider: Theodis Shove Other Clinician: Referring Provider: Treating Provider/Extender: Virgina Norfolk in Treatment: 12 History of Present Illness HPI Description: 02/16/2020 upon evaluation today patient actually appears to be doing poorly in regard to her left medial lower extremity ulcer. This is actually an area that she tells me she has had intermittent issues with over the years although has been closed for some time she typically uses compression right now she has juxta lite compression wraps. With that being said she tells me that this nonetheless open several weeks/months ago and has been given her trouble since. She does have a history of chronic venous insufficiency she is seeing specialist for this in the past she has had an ablation as well as sclerotherapy. With that being said she also has hypertension chronically which is managed by her primary care provider. In general she seems to be worsening overall with regard to the wound and states that she finally realized that she needed to come in and have somebody look at this and not continue to try to manage this on her own. No fevers, chills, nausea, vomiting, or diarrhea. 02/23/2020 on evaluation today patient appears to be doing well with regard to her wound. This is showing some signs of improvement which is great news still were not quite at the point where I would like to be as far as the overall appearance of the wound is concerned but I do believe this is better than last week. I do believe the Iodoflex is helping as well. 03/08/2020 upon evaluation today patient appears to be doing well with regard to her wound. She  has been tolerating the dressing changes without complication. Fortunately I feel like she has made great progress with the Iodoflex but I feel like it may be the point rest to switch to something else possibly a collagen- based dressing at this time. 03/15/2020 upon evaluation today patient appears to be doing excellent in regard to her leg ulcer. She has been tolerating the dressing changes without complication. Fortunately there is no signs of active infection. Overall she is measuring a little bit smaller today which is great news. 03/22/2020 upon evaluation today patient appears to be doing well with regard to her wound. She has been tolerating the dressing changes without complication. Fortunately there is no signs of active infection at this time. 03/28/2020; patient I do not usually see however she has a wound on the left anterior lower leg secondary to chronic venous insufficiency we have been using silver collagen under compression. She arrives in clinic with a nonviable surface requiring debridement 04/12/2020 upon evaluation today patient appears to be doing well all things considered with regard to her leg ulcer. She is tolerating the dressing changes without complication there is minimal dry skin around the edges of the wound that may be trapping and stopping some of the events of the new skin I am can work on that today. Otherwise the surface of the wound appears to be doing excellent. 04/19/2020 upon evaluation today patient appears to be doing well with regard to her leg ulcer. She has been tolerating dressing changes without complication. Fortunately there is no signs of active infection at this time. No fever chills noted overall very pleased  with how things seem to be progressing. 04/26/2020 on evaluation today patient appears to be doing well with regard to her wound currently. Is showing signs of excellent improvement overall is filling in nicely and there does not appear to be any  signs of infection. No fevers, chills, nausea, vomiting, or diarrhea. 05/03/2020 upon evaluation today patient appears to be doing well with regard to her wound on the leg. This overall showing signs of good improvement which is great she has some good epithelial growth and overall I think that things are moving in the correct direction. We likewise going to continue with the wound care measures as before since she seems to making such good improvement. 2/3; venous wound on the left medial leg. This is contracting. We are using Prisma and 3 layer compression. She has a stocking and waiting in the eventuality this heals. She is already using it on the right Electronic Signature(s) Signed: 05/11/2020 4:57:15 PM By: Baltazar Najjar MD Entered By: Baltazar Najjar on 05/11/2020 09:04:35 -------------------------------------------------------------------------------- Physical Exam Details Patient Name: Date of Service: [name], [name] 05/11/2020 8:00 A M Medical Record Number: [id number] Patient Account Number: [id number] Date of Birth/Sex: Treating RN: 1960/10/29 (60 y.o. [name] Primary Care Provider: Theodis Shove Other Clinician: Referring Provider: Treating Provider/Extender: Virgina Norfolk in Treatment: 12 Constitutional Patient is hypertensive.. Pulse regular and within target range for patient.Marland Kitchen Respirations regular, non-labored and within target range.. Temperature is normal and within the target range for the patient.Marland Kitchen Appears in no distress. Cardiovascular Pedal pulses are palpable on the left. We have good edema control. Notes Wound exam; the wound was debrided with wound cleanser and gauze. Cleans up quite nicely healthy granulation no mechanical debridement is necessary. Surface area of the wound is smaller Electronic Signature(s) Signed: 05/11/2020 4:57:15 PM By: Baltazar Najjar MD Entered By: Baltazar Najjar on 05/11/2020  09:08:13 -------------------------------------------------------------------------------- Physician Orders Details Patient Name: Date of Service: [name], [name] 05/11/2020 8:00 A M Medical Record Number: [id number] Patient Account Number: [id number] Date of Birth/Sex: Treating RN: [birth year] (60 y.o. [name] Primary Care Provider: Theodis Shove Other Clinician: Referring Provider: Treating Provider/Extender: Virgina Norfolk in Treatment: 12 Verbal / Phone Orders: No Diagnosis Coding ICD-10 Coding Code Description I87.2 Venous insufficiency (chronic) (peripheral) L97.822 Non-pressure chronic ulcer of other part of left lower leg with fat layer exposed I10 Essential (primary) hypertension Follow-up Appointments Return Appointment in 1 week. Bathing/ Shower/ Hygiene May shower with protection but do not get wound dressing(s) wet. Edema Control - Lymphedema / SCD / Other Bilateral Lower Extremities Elevate legs to the level of the heart or above for 30 minutes daily and/or when sitting, a frequency of: - throughout the day Avoid standing for long periods of time. Exercise regularly Wound Treatment Wound #1 - Lower Leg Wound Laterality: Left, Medial Peri-Wound Care: Sween Lotion (Moisturizing lotion) 1 x Per Week Discharge Instructions: Apply moisturizing lotion to leg Prim Dressing: Promogran Prisma Matrix, 4.34 (sq in) (silver collagen) 1 x Per Week ary Discharge Instructions: Moisten collagen with saline or hydrogel Secondary Dressing: Woven Gauze Sponge, Non-Sterile 4x4 in 1 x Per Week Discharge Instructions: Apply over primary dressing as directed. Secondary Dressing: Drawtex 4x4 in 1 x Per Week Discharge Instructions: Apply over primary dressing cut to fit inside wound edges Compression Wrap: ThreePress (3 layer compression wrap) 1 x Per Week Discharge Instructions: Apply three layer compression , use unna layer at top to secure  wrap Electronic Signature(s)  Signed: 05/11/2020 4:57:15 PM By: Baltazar Najjarobson, Dejaun Vidrio MD Signed: 05/11/2020 5:30:16 PM By: Shawn Stalleaton, Bobbi Entered By: Shawn Stalleaton, Bobbi on 05/11/2020 08:49:33 -------------------------------------------------------------------------------- Problem List Details Patient Name: Date of Service: [name], [name] 05/11/2020 8:00 A M Medical Record Number: [id number] Patient Account Number: [id number] Date of Birth/Sex: Treating RN: 05/19/1960 (60 y.o. Arta SilenceF) Deaton, Bobbi Primary Care Provider: Theodis ShoveKoberlein, Junell Other Clinician: Referring Provider: Treating Provider/Extender: Virgina Norfolkobson, Jawaan Adachi Koberlein, Junell Weeks in Treatment: 12 Active Problems ICD-10 Encounter Code Description Active Date MDM Diagnosis I87.2 Venous insufficiency (chronic) (peripheral) 02/16/2020 No Yes L97.822 Non-pressure chronic ulcer of other part of left lower leg with fat layer 02/16/2020 No Yes exposed I10 Essential (primary) hypertension 02/16/2020 No Yes Inactive Problems Resolved Problems Electronic Signature(s) Signed: 05/11/2020 4:57:15 PM By: Baltazar Najjarobson, Honora Searson MD Entered By: Baltazar Najjarobson, Chaeli Judy on 05/11/2020 09:02:57 -------------------------------------------------------------------------------- Progress Note Details Patient Name: Date of Service: [name], [name] 05/11/2020 8:00 A M Medical Record Number: [id number] Patient Account Number: [id number] Date of Birth/Sex: Treating RN: 02/17/1961 (60 y.o. Arta SilenceF) Deaton, Bobbi Primary Care Provider: Theodis ShoveKoberlein, Junell Other Clinician: Referring Provider: Treating Provider/Extender: Virgina Norfolkobson, Kambri Dismore Koberlein, Junell Weeks in Treatment: 12 Subjective History of Present Illness (HPI) 02/16/2020 upon evaluation today patient actually appears to be doing poorly in regard to her left medial lower extremity ulcer. This is actually an area that she tells me she has had intermittent issues with over the years although has been closed for some time she typically  uses compression right now she has juxta lite compression wraps. With that being said she tells me that this nonetheless open several weeks/months ago and has been given her trouble since. She does have a history of chronic venous insufficiency she is seeing specialist for this in the past she has had an ablation as well as sclerotherapy. With that being said she also has hypertension chronically which is managed by her primary care provider. In general she seems to be worsening overall with regard to the wound and states that she finally realized that she needed to come in and have somebody look at this and not continue to try to manage this on her own. No fevers, chills, nausea, vomiting, or diarrhea. 02/23/2020 on evaluation today patient appears to be doing well with regard to her wound. This is showing some signs of improvement which is great news still were not quite at the point where I would like to be as far as the overall appearance of the wound is concerned but I do believe this is better than last week. I do believe the Iodoflex is helping as well. 03/08/2020 upon evaluation today patient appears to be doing well with regard to her wound. She has been tolerating the dressing changes without complication. Fortunately I feel like she has made great progress with the Iodoflex but I feel like it may be the point rest to switch to something else possibly a collagen- based dressing at this time. 03/15/2020 upon evaluation today patient appears to be doing excellent in regard to her leg ulcer. She has been tolerating the dressing changes without complication. Fortunately there is no signs of active infection. Overall she is measuring a little bit smaller today which is great news. 03/22/2020 upon evaluation today patient appears to be doing well with regard to her wound. She has been tolerating the dressing changes without complication. Fortunately there is no signs of active infection at this  time. 03/28/2020; patient I do not usually see however she has a wound on the left anterior  lower leg secondary to chronic venous insufficiency we have been using silver collagen under compression. She arrives in clinic with a nonviable surface requiring debridement 04/12/2020 upon evaluation today patient appears to be doing well all things considered with regard to her leg ulcer. She is tolerating the dressing changes without complication there is minimal dry skin around the edges of the wound that may be trapping and stopping some of the events of the new skin I am can work on that today. Otherwise the surface of the wound appears to be doing excellent. 04/19/2020 upon evaluation today patient appears to be doing well with regard to her leg ulcer. She has been tolerating dressing changes without complication. Fortunately there is no signs of active infection at this time. No fever chills noted overall very pleased with how things seem to be progressing. 04/26/2020 on evaluation today patient appears to be doing well with regard to her wound currently. Is showing signs of excellent improvement overall is filling in nicely and there does not appear to be any signs of infection. No fevers, chills, nausea, vomiting, or diarrhea. 05/03/2020 upon evaluation today patient appears to be doing well with regard to her wound on the leg. This overall showing signs of good improvement which is great she has some good epithelial growth and overall I think that things are moving in the correct direction. We likewise going to continue with the wound care measures as before since she seems to making such good improvement. 2/3; venous wound on the left medial leg. This is contracting. We are using Prisma and 3 layer compression. She has a stocking and waiting in the eventuality this heals. She is already using it on the right Objective Constitutional Patient is hypertensive.. Pulse regular and within target range for  patient.Marland Kitchen Respirations regular, non-labored and within target range.. Temperature is normal and within the target range for the patient.Marland Kitchen Appears in no distress. Vitals Time Taken: 8:00 AM, Height: 64 in, Weight: 278 lbs, BMI: 47.7, Temperature: 97.8 F, Pulse: 93 bpm, Respiratory Rate: 18 breaths/min, Blood Pressure: 154/83 mmHg. Cardiovascular Pedal pulses are palpable on the left. We have good edema control. General Notes: Wound exam; the wound was debrided with wound cleanser and gauze. Cleans up quite nicely healthy granulation no mechanical debridement is necessary. Surface area of the wound is smaller Integumentary (Hair, Skin) Wound #1 status is Open. Original cause of wound was Gradually Appeared. The wound is located on the Left,Medial Lower Leg. The wound measures 0.8cm length x 0.5cm width x 0.2cm depth; 0.314cm^2 area and 0.063cm^3 volume. There is Fat Layer (Subcutaneous Tissue) exposed. There is no tunneling or undermining noted. There is a small amount of serosanguineous drainage noted. The wound margin is distinct with the outline attached to the wound base. There is large (67-100%) pink granulation within the wound bed. There is no necrotic tissue within the wound bed. Assessment Active Problems ICD-10 Venous insufficiency (chronic) (peripheral) Non-pressure chronic ulcer of other part of left lower leg with fat layer exposed Essential (primary) hypertension Procedures Wound #1 Pre-procedure diagnosis of Wound #1 is a Venous Leg Ulcer located on the Left,Medial Lower Leg . There was a Three Layer Compression Therapy Procedure by Zandra Abts, RN. Post procedure Diagnosis Wound #1: Same as Pre-Procedure Plan Follow-up Appointments: Return Appointment in 1 week. Bathing/ Shower/ Hygiene: May shower with protection but do not get wound dressing(s) wet. Edema Control - Lymphedema / SCD / Other: Elevate legs to the level of the heart or above  for 30 minutes daily and/or  when sitting, a frequency of: - throughout the day Avoid standing for long periods of time. Exercise regularly WOUND #1: - Lower Leg Wound Laterality: Left, Medial Peri-Wound Care: Sween Lotion (Moisturizing lotion) 1 x Per Week/ Discharge Instructions: Apply moisturizing lotion to leg Prim Dressing: Promogran Prisma Matrix, 4.34 (sq in) (silver collagen) 1 x Per Week/ ary Discharge Instructions: Moisten collagen with saline or hydrogel Secondary Dressing: Woven Gauze Sponge, Non-Sterile 4x4 in 1 x Per Week/ Discharge Instructions: Apply over primary dressing as directed. Secondary Dressing: Drawtex 4x4 in 1 x Per Week/ Discharge Instructions: Apply over primary dressing cut to fit inside wound edges Com pression Wrap: ThreePress (3 layer compression wrap) 1 x Per Week/ Discharge Instructions: Apply three layer compression , use unna layer at top to secure wrap 1. I did not change the primary dressing which is moistened collagen 2. This looks like it is on its way to closure I have asked her to bring her remaining stocking to each of her subsequent visits. Electronic Signature(s) Signed: 05/11/2020 4:57:15 PM By: Baltazar Najjar MD Entered By: Baltazar Najjar on 05/11/2020 09:09:43 -------------------------------------------------------------------------------- SuperBill Details Patient Name: Date of Service: [name], [name] 05/11/2020 Medical Record Number: [id number] Patient Account Number: [id number] Date of Birth/Sex: Treating RN: [birth year] (60 y.o. [name] Primary Care Provider: Theodis Shove Other Clinician: Referring Provider: Treating Provider/Extender: Virgina Norfolk in Treatment: 12 Diagnosis Coding ICD-10 Codes Code Description I87.2 Venous insufficiency (chronic) (peripheral) L97.822 Non-pressure chronic ulcer of other part of left lower leg with fat layer exposed I10 Essential (primary) hypertension Facility Procedures CPT4 Code:  [id number] Description: (Facility Use Only) [id number] - APPLY MULTLAY COMPRS LWR LT LEG Modifier: Quantity: 1 Physician Procedures Electronic Signature(s) Signed: 05/11/2020 4:57:15 PM By: Baltazar Najjar MD Entered By: Baltazar Najjar on 05/11/2020 09:10:03

## 2020-05-15 ENCOUNTER — Other Ambulatory Visit: Payer: Self-pay | Admitting: Family Medicine

## 2020-05-15 DIAGNOSIS — I10 Essential (primary) hypertension: Secondary | ICD-10-CM

## 2020-05-15 NOTE — Progress Notes (Signed)
[name], [name] ([id number]) Visit Report for 05/11/2020 Arrival Information Details Patient Name: Date of Service: [place] DA 05/11/2020 8:00 [place] Record Number: [id number] Patient Account Number: [id number] Date of Birth/Sex: Treating RN: [birth year] (60 y.o. Helene Shoe, Tammi Klippel Primary Care Makailyn Mccormick: Micheline Rough Other Clinician: Referring Adine Heimann: Treating Teylor Wolven/Extender: Mancel Bale in Treatment: 12 Visit Information History Since Last Visit Added or deleted any medications: No Patient Arrived: Ambulatory Any new allergies or adverse reactions: No Arrival Time: 07:58 Had a fall or experienced change in No Accompanied By: self activities of daily living that may affect Transfer Assistance: None risk of falls: Patient Identification Verified: Yes Signs or symptoms of abuse/neglect since last visito No Secondary Verification Process Completed: Yes Hospitalized since last visit: No Patient Requires Transmission-Based Precautions: No Implantable device outside of the clinic excluding No Patient Has Alerts: Yes cellular tissue based products placed in the center Patient Alerts: ABI noncompressible since last visit: Has Dressing in Place as Prescribed: Yes Pain Present Now: No Electronic Signature(s) Signed: 05/15/2020 9:06:40 AM By: Sandre Kitty Entered By: Sandre Kitty on 05/11/2020 08:00:05 -------------------------------------------------------------------------------- Compression Therapy Details Patient Name: Date of Service: [name], Spero Curb DA 05/11/2020 8:00 A M Medical Record Number: [id number] Patient Account Number: [id number] Date of Birth/Sex: Treating RN: 09-Mar-1961 (60 y.o. [name] Primary Care Refugio Vandevoorde: Micheline Rough Other Clinician: Referring Henderson Frampton: Treating Karyssa Amaral/Extender: Mancel Bale in Treatment: 12 Compression Therapy Performed for Wound Assessment: Wound #1  Left,Medial Lower Leg Performed By: Clinician Levan Hurst, RN Compression Type: Three Layer Post Procedure Diagnosis Same as Pre-procedure Electronic Signature(s) Signed: 05/11/2020 5:30:16 PM By: Deon Pilling Entered By: Deon Pilling on 05/11/2020 08:49:05 -------------------------------------------------------------------------------- Encounter Discharge Information Details Patient Name: Date of Service: [name], [place] 05/11/2020 8:00 [place] Record Number: [id number] Patient Account Number: [id number] Date of Birth/Sex: Treating RN: 04-08-1961 (60 y.o. [name] Primary Care Marshal Eskew: Micheline Rough Other Clinician: Referring Dal Blew: Treating Selma Mink/Extender: Mancel Bale in Treatment: 12 Encounter Discharge Information Items Discharge Condition: Stable Ambulatory Status: Ambulatory Discharge Destination: Home Transportation: Private Auto Accompanied By: alone Schedule Follow-up Appointment: Yes Clinical Summary of Care: Patient Declined Electronic Signature(s) Signed: 05/12/2020 4:51:44 PM By: Levan Hurst RN, BSN Entered By: Levan Hurst on 05/11/2020 09:23:46 -------------------------------------------------------------------------------- Lower Extremity Assessment Details Patient Name: Date of Service: [place], [place] 05/11/2020 8:00 [place] Record Number: [id number] Patient Account Number: [id number] Date of Birth/Sex: Treating RN: [birth year] (60 y.o. [name] Primary Care Deniro Laymon: Micheline Rough Other Clinician: Referring Mykell Rawl: Treating Jackston Oaxaca/Extender: Mancel Bale in Treatment: 12 Edema Assessment Assessed: [Left: No] [name]: No] Edema: [Left: Ye] [Right: s] Calf Left: Right: Point of Measurement: 35 cm From Medial Instep 39 cm Ankle Left: Right: Point of Measurement: 9 cm From Medial Instep 23 cm Electronic Signature(s) Signed: 05/12/2020 4:51:44 PM  By: Levan Hurst RN, BSN Entered By: Levan Hurst on 05/11/2020 08:16:43 -------------------------------------------------------------------------------- Multi Wound Chart Details Patient Name: Date of Service: [name], [place] 05/11/2020 8:00 A M Medical Record Number: [id number] Patient Account Number: [id number] Date of Birth/Sex: Treating RN: 28-Sep-1960 (60 y.o. [name] Primary Care Karinne Schmader: Micheline Rough Other Clinician: Referring Seger Jani: Treating Champion Corales/Extender: Mancel Bale in Treatment: 12 Vital Signs Height(in): 64 Pulse(bpm): 93 Weight(lbs): 278 Blood Pressure(mmHg): 154/83 Body Mass Index(BMI): 48 Temperature(F): 97.8 Respiratory Rate(breaths/min): 18 Photos: [1:No Photos Left, Medial Lower Leg] [N/A:N/A N/A] Wound Location: [1:Gradually Appeared] [N/A:N/A] Wounding Event: [1:Venous  Leg Ulcer] [N/A:N/A] Primary Etiology: [1:Anemia, Sleep Apnea, Hypertension, N/A] Comorbid History: [1:Peripheral Venous Disease 01/26/2020] [N/A:N/A] Date Acquired: [1:12] [N/A:N/A] Weeks of Treatment: [1:Open] [N/A:N/A] Wound Status: [1:0.8x0.5x0.2] [N/A:N/A] Measurements L x W x D (cm) [1:0.314] [N/A:N/A] A (cm) : rea [1:0.063] [N/A:N/A] Volume (cm) : [1:90.70%] [N/A:N/A] % Reduction in Area: [1:93.80%] [N/A:N/A] % Reduction in Volume: [1:Full Thickness Without Exposed] [N/A:N/A] Classification: [1:Support Structures Small] [N/A:N/A] Exudate Amount: [1:Serosanguineous] [N/A:N/A] Exudate Type: [1:red, brown] [N/A:N/A] Exudate Color: [1:Distinct, outline attached] [N/A:N/A] Wound Margin: [1:Large (67-100%)] [N/A:N/A] Granulation Amount: [1:Pink] [N/A:N/A] Granulation Quality: [1:None Present (0%)] [N/A:N/A] Necrotic Amount: [1:Fat Layer (Subcutaneous Tissue): Yes N/A] Exposed Structures: [1:Fascia: No Tendon: No Muscle: No Joint: No Bone: No Medium (34-66%)] [N/A:N/A] Epithelialization: [1:Compression Therapy]  [N/A:N/A] Treatment Notes Electronic Signature(s) Signed: 05/11/2020 4:57:15 PM By: Linton Ham MD Signed: 05/11/2020 5:30:16 PM By: Deon Pilling Entered By: Linton Ham on 05/11/2020 09:03:03 -------------------------------------------------------------------------------- Multi-Disciplinary Care Plan Details Patient Name: Date of Service: [place], [name] DA 05/11/2020 8:00 A M Medical Record Number: [id number] Patient Account Number: [id number] Date of Birth/Sex: Treating RN: [birth year] (60 y.o. [name] Primary Care Jahvier Aldea: Micheline Rough Other Clinician: Referring Latonia Conrow: Treating Faithe Ariola/Extender: Mancel Bale in Treatment: 12 Active Inactive Venous Leg Ulcer Nursing Diagnoses: Knowledge deficit related to disease process and management Potential for venous Insuffiency (use before diagnosis confirmed) Goals: Patient will maintain optimal edema control Date Initiated: 02/16/2020 Target Resolution Date: 07/07/2020 Goal Status: Active Interventions: Assess peripheral edema status every visit. Compression as ordered Provide education on venous insufficiency Treatment Activities: Therapeutic compression applied : 02/16/2020 Notes: Wound/Skin Impairment Nursing Diagnoses: Impaired tissue integrity Knowledge deficit related to ulceration/compromised skin integrity Goals: Patient/caregiver will verbalize understanding of skin care regimen Date Initiated: 02/16/2020 Target Resolution Date: 06/09/2020 Goal Status: Active Ulcer/skin breakdown will have a volume reduction of 30% by week 4 Date Initiated: 02/16/2020 Date Inactivated: 03/22/2020 Target Resolution Date: 03/15/2020 Goal Status: Met Ulcer/skin breakdown will have a volume reduction of 50% by week 8 Date Initiated: 03/22/2020 Date Inactivated: 04/19/2020 Target Resolution Date: 04/19/2020 Goal Status: Met Ulcer/skin breakdown will have a volume reduction of 80% by week  12 Date Initiated: 04/19/2020 Target Resolution Date: 05/17/2020 Goal Status: Active Interventions: Assess patient/caregiver ability to obtain necessary supplies Assess patient/caregiver ability to perform ulcer/skin care regimen upon admission and as needed Assess ulceration(s) every visit Provide education on ulcer and skin care Treatment Activities: Skin care regimen initiated : 02/16/2020 Topical wound management initiated : 02/16/2020 Notes: Electronic Signature(s) Signed: 05/11/2020 5:30:16 PM By: Deon Pilling Entered By: Deon Pilling on 05/11/2020 08:06:21 -------------------------------------------------------------------------------- Pain Assessment Details Patient Name: Date of Service: [name], [place] 05/11/2020 8:00 [place] Record Number: [id number] Patient Account Number: [id number] Date of Birth/Sex: Treating RN: 20-Aug-1960 (60 y.o. [name] Primary Care Dquan Cortopassi: Micheline Rough Other Clinician: Referring Aidaly Cordner: Treating Epsie Walthall/Extender: Mancel Bale in Treatment: 12 Active Problems Location of Pain Severity and Description of Pain Patient Has Paino No Site Locations Pain Management and Medication Current Pain Management: Electronic Signature(s) Signed: 05/11/2020 5:30:16 PM By: Deon Pilling Signed: 05/15/2020 9:06:40 AM By: Sandre Kitty Entered By: Sandre Kitty on 05/11/2020 08:00:26 -------------------------------------------------------------------------------- Patient/Caregiver Education Details Patient Name: Date of Service: [name], [name] DA 2/3/2022andnbsp8:00 A M Medical Record Number: [id number] Patient Account Number: [id number] Date of Birth/Gender: Treating RN: January 16, 1961 (60 y.o. [name] Primary Care Physician: Micheline Rough Other Clinician: Referring Physician: Treating Physician/Extender: Mancel Bale in Treatment: 12 Education Assessment Education  Provided To: Patient Education Topics Provided Venous: Handouts: Managing Venous Disease and Related Ulcers Methods: Explain/Verbal Responses: Reinforcements needed Electronic Signature(s) Signed: 05/11/2020 5:30:16 PM By: Deon Pilling Entered By: Deon Pilling on 05/11/2020 08:07:04 -------------------------------------------------------------------------------- Wound Assessment Details Patient Name: Date of Service: [name], [place] 05/11/2020 8:00 [place] Record Number: [id number] Patient Account Number: [id number] Date of Birth/Sex: Treating RN: 04-24-1960 (60 y.o. Helene Shoe, Meta.Reding Primary Care Nissan Frazzini: Micheline Rough Other Clinician: Referring Franziska Podgurski: Treating Hensley Aziz/Extender: Mancel Bale in Treatment: 12 Wound Status Wound Number: 1 Primary Venous Leg Ulcer Etiology: Wound Location: Left, Medial Lower Leg Wound Status: Open Wounding Event: Gradually Appeared Comorbid Anemia, Sleep Apnea, Hypertension, Peripheral Venous Date Acquired: 01/26/2020 History: Disease Weeks Of Treatment: 12 Clustered Wound: No Photos Photo Uploaded By: Mikeal Hawthorne on 05/12/2020 15:22:40 Wound Measurements Length: (cm) 0.8 Width: (cm) 0.5 Depth: (cm) 0.2 Area: (cm) 0.314 Volume: (cm) 0.063 % Reduction in Area: 90.7% % Reduction in Volume: 93.8% Epithelialization: Medium (34-66%) Tunneling: No Undermining: No Wound Description Classification: Full Thickness Without Exposed Support Structures Wound Margin: Distinct, outline attached Exudate Amount: Small Exudate Type: Serosanguineous Exudate Color: red, brown Foul Odor After Cleansing: No Slough/Fibrino No Wound Bed Granulation Amount: Large (67-100%) Exposed Structure Granulation Quality: Pink Fascia Exposed: No Necrotic Amount: None Present (0%) Fat Layer (Subcutaneous Tissue) Exposed: Yes Tendon Exposed: No Muscle Exposed: No Joint Exposed: No Bone Exposed: No Treatment  Notes Wound #1 (Lower Leg) Wound Laterality: Left, Medial Cleanser Peri-Wound Care Sween Lotion (Moisturizing lotion) Discharge Instruction: Apply moisturizing lotion to leg Topical Primary Dressing Promogran Prisma Matrix, 4.34 (sq in) (silver collagen) Discharge Instruction: Moisten collagen with saline or hydrogel Secondary Dressing Woven Gauze Sponge, Non-Sterile 4x4 in Discharge Instruction: Apply over primary dressing as directed. Drawtex 4x4 in Discharge Instruction: Apply over primary dressing cut to fit inside wound edges Secured With Compression Wrap ThreePress (3 layer compression wrap) Discharge Instruction: Apply three layer compression , use unna layer at top to secure wrap Compression Stockings Add-Ons Electronic Signature(s) Signed: 05/11/2020 5:30:16 PM By: Deon Pilling Signed: 05/12/2020 4:51:44 PM By: Levan Hurst RN, BSN Entered By: Levan Hurst on 05/11/2020 08:16:57 -------------------------------------------------------------------------------- [place] Details Patient Name: Date of Service: [name], [place] 05/11/2020 8:00 [place] Record Number: [id number] Patient Account Number: [id number] Date of Birth/Sex: Treating RN: 1961-02-12 (60 y.o. Helene Shoe, Tammi Klippel Primary Care Nishi Neiswonger: Micheline Rough Other Clinician: Referring Renesme Kerrigan: Treating Lars Jeziorski/Extender: Mancel Bale in Treatment: 12 Vital Signs Time Taken: 08:00 Temperature (F): 97.8 Height (in): 64 Pulse (bpm): 93 Weight (lbs): 278 Respiratory Rate (breaths/min): 18 Body Mass Index (BMI): 47.7 Blood Pressure (mmHg): 154/83 Reference Range: 80 - 120 mg / dl Electronic Signature(s) Signed: 05/15/2020 9:06:40 AM By: Sandre Kitty Entered By: Sandre Kitty on 05/11/2020 08:00:21

## 2020-05-16 NOTE — Progress Notes (Signed)
ELAINAH, RHYNE ([id number]) Visit Report for 04/26/2020 Arrival Information Details Patient Name: Date of Service: [place] DA 04/26/2020 3:30 PM Medical Record Number: [id number] Patient Account Number: [id number] Date of Birth/Sex: Treating RN: [birth year] (60 y.o. Martyn Malay, [name] Primary Care Provider: Micheline Rough Other Clinician: Referring Provider: Treating Provider/Extender: Vaughan Basta Weeks in Treatment: 10 Visit Information History Since Last Visit Added or deleted any medications: No Patient Arrived: Ambulatory Any new allergies or adverse reactions: No Arrival Time: 16:25 Had a fall or experienced change in No Accompanied By: self activities of daily living that may affect Transfer Assistance: None risk of falls: Patient Identification Verified: Yes Signs or symptoms of abuse/neglect since last visito No Secondary Verification Process Completed: Yes Hospitalized since last visit: No Patient Requires Transmission-Based Precautions: No Implantable device outside of the clinic excluding No Patient Has Alerts: Yes cellular tissue based products placed in the center Patient Alerts: ABI noncompressible since last visit: Has Dressing in Place as Prescribed: Yes Pain Present Now: No Electronic Signature(s) Signed: 04/27/2020 2:45:22 PM By: Sandre Kitty Entered By: Sandre Kitty on 04/26/2020 16:27:47 -------------------------------------------------------------------------------- Compression Therapy Details Patient Name: Date of Service: [name], [place] C[place] DA 04/26/2020 3:30 PM Medical Record Number: [id number] Patient Account Number: [id number] Date of Birth/Sex: Treating RN: 09/20/1960 (61 y.o. [name] Primary Care Provider: Micheline Rough Other Clinician: Referring Provider: Treating Provider/Extender: Vaughan Basta Weeks in Treatment: 10 Compression Therapy Performed for Wound Assessment: Wound #1  Left,Medial Lower Leg Performed By: Clinician Rhae Hammock, RN Compression Type: Three Layer Post Procedure Diagnosis Same as Pre-procedure Electronic Signature(s) Signed: 04/26/2020 5:51:30 PM By: Baruch Gouty RN, BSN Entered By: Baruch Gouty on 04/26/2020 16:59:57 -------------------------------------------------------------------------------- Encounter Discharge Information Details Patient Name: Date of Service: [place], [place] 04/26/2020 3:30 PM Medical Record Number: [id number] Patient Account Number: [id number] Date of Birth/Sex: Treating RN: 12-26-1960 (60 y.o. [name], [name] Primary Care Provider: Micheline Rough Other Clinician: Referring Provider: Treating Provider/Extender: Vaughan Basta Weeks in Treatment: 10 Encounter Discharge Information Items Discharge Condition: Stable Ambulatory Status: Ambulatory Discharge Destination: Home Transportation: Private Auto Accompanied By: self Schedule Follow-up Appointment: Yes Clinical Summary of Care: Patient Declined Electronic Signature(s) Signed: 04/26/2020 5:25:13 PM By: Rhae Hammock RN Entered By: Rhae Hammock on 04/26/2020 17:24:45 -------------------------------------------------------------------------------- Lower Extremity Assessment Details Patient Name: Date of Service: [name], S[place] C[place] DA 04/26/2020 3:30 PM Medical Record Number: [id number] Patient Account Number: [id number] Date of Birth/Sex: Treating RN: April 26, 1960 (60 y.o. [name] Primary Care Provider: Micheline Rough Other Clinician: Referring Provider: Treating Provider/Extender: Vaughan Basta Weeks in Treatment: 10 Edema Assessment Assessed: [Left: No] [Right: No] Edema: [Left: Ye] [Right: s] Calf Left: Right: Point of Measurement: 35 cm From Medial Instep 43.6 cm Ankle Left: Right: Point of Measurement: 9 cm From Medial Instep 22 cm Vascular  Assessment Pulses: Dorsalis Pedis Palpable: [Left:Yes] Electronic Signature(s) Signed: 04/26/2020 5:51:30 PM By: Baruch Gouty RN, BSN Signed: 04/27/2020 2:45:22 PM By: Sandre Kitty Entered By: Sandre Kitty on 04/26/2020 16:32:39 -------------------------------------------------------------------------------- [place] Details Patient Name: Date of Service: [place], [name] 04/26/2020 3:30 PM Medical Record Number: [id number] Patient Account Number: [id number] Date of Birth/Sex: Treating RN: [birth year] (60 y.o. [name] Primary Care Provider: Micheline Rough Other Clinician: Referring Provider: Treating Provider/Extender: Vaughan Basta Weeks in Treatment: 10 Active Inactive Venous Leg Ulcer Nursing Diagnoses: Knowledge deficit related to disease process and management Potential for venous Insuffiency (use before diagnosis  confirmed) Goals: Patient will maintain optimal edema control Date Initiated: 02/16/2020 Target Resolution Date: 05/17/2020 Goal Status: Active Interventions: Assess peripheral edema status every visit. Compression as ordered Provide education on venous insufficiency Treatment Activities: Therapeutic compression applied : 02/16/2020 Notes: Wound/Skin Impairment Nursing Diagnoses: Impaired tissue integrity Knowledge deficit related to ulceration/compromised skin integrity Goals: Patient/caregiver will verbalize understanding of skin care regimen Date Initiated: 02/16/2020 Target Resolution Date: 05/17/2020 Goal Status: Active Ulcer/skin breakdown will have a volume reduction of 30% by week 4 Date Initiated: 02/16/2020 Date Inactivated: 03/22/2020 Target Resolution Date: 03/15/2020 Goal Status: Met Ulcer/skin breakdown will have a volume reduction of 50% by week 8 Date Initiated: 03/22/2020 Date Inactivated: 04/19/2020 Target Resolution Date: 04/19/2020 Goal Status: Met Ulcer/skin breakdown  will have a volume reduction of 80% by week 12 Date Initiated: 04/19/2020 Target Resolution Date: 05/17/2020 Goal Status: Active Interventions: Assess patient/caregiver ability to obtain necessary supplies Assess patient/caregiver ability to perform ulcer/skin care regimen upon admission and as needed Assess ulceration(s) every visit Provide education on ulcer and skin care Treatment Activities: Skin care regimen initiated : 02/16/2020 Topical wound management initiated : 02/16/2020 Notes: Electronic Signature(s) Signed: 04/26/2020 5:51:30 PM By: Baruch Gouty RN, BSN Entered By: Baruch Gouty on 04/26/2020 16:59:13 -------------------------------------------------------------------------------- Pain Assessment Details Patient Name: Date of Service: [name], Spero Curb [place] 04/26/2020 3:30 PM Medical Record Number: [id number] Patient Account Number: [id number] Date of Birth/Sex: Treating RN: Aug 20, 1960 (60 y.o. [name] Primary Care Provider: Micheline Rough Other Clinician: Referring Provider: Treating Provider/Extender: Vaughan Basta Weeks in Treatment: 10 Active Problems Location of Pain Severity and Description of Pain Patient Has Paino No Site Locations Pain Management and Medication Current Pain Management: Electronic Signature(s) Signed: 04/26/2020 5:51:30 PM By: Baruch Gouty RN, BSN Signed: 04/27/2020 2:45:22 PM By: Sandre Kitty Entered By: Sandre Kitty on 04/26/2020 16:28:08 -------------------------------------------------------------------------------- Patient/Caregiver Education Details Patient Name: Date of Service: [name] 1/19/2022andnbsp3:30 PM Medical Record Number: [id number] Patient Account Number: [id number] Date of Birth/Gender: Treating RN: 1960/09/04 (60 y.o. [name] Primary Care Physician: Micheline Rough Other Clinician: Referring Physician: Treating Physician/Extender: Stevie Kern in Treatment: 10 Education Assessment Education Provided To: Patient Education Topics Provided Venous: Methods: Explain/Verbal Responses: Reinforcements needed, State content correctly Wound/Skin Impairment: Methods: Explain/Verbal Responses: Reinforcements needed Electronic Signature(s) Signed: 04/26/2020 5:51:30 PM By: Baruch Gouty RN, BSN Entered By: Baruch Gouty on 04/26/2020 16:59:32 -------------------------------------------------------------------------------- Wound Assessment Details Patient Name: Date of Service: [name], [place] 04/26/2020 3:30 PM Medical Record Number: [id number] Patient Account Number: [id number] Date of Birth/Sex: Treating RN: [birth year] (60 y.o. [name] Primary Care Provider: Micheline Rough Other Clinician: Referring Provider: Treating Provider/Extender: Vaughan Basta Weeks in Treatment: 10 Wound Status Wound Number: 1 Primary Venous Leg Ulcer Etiology: Wound Location: Left, Medial Lower Leg Wound Status: Open Wounding Event: Gradually Appeared Comorbid Anemia, Sleep Apnea, Hypertension, Peripheral Venous Date Acquired: 01/26/2020 History: Disease Weeks Of Treatment: 10 Clustered Wound: No Photos Photo Uploaded By: Mikeal Hawthorne on 04/28/2020 10:24:17 Wound Measurements Length: (cm) 1.8 Width: (cm) 0.7 Depth: (cm) 0.2 Area: (cm) 0.99 Volume: (cm) 0.198 % Reduction in Area: 70.6% % Reduction in Volume: 80.4% Epithelialization: Small (1-33%) Tunneling: No Undermining: No Wound Description Classification: Full Thickness Without Exposed Support Structures Wound Margin: Distinct, outline attached Exudate Amount: Medium Exudate Type: Serosanguineous Exudate Color: red, brown Foul Odor After Cleansing: No Slough/Fibrino Yes Wound Bed Granulation Amount: Large (67-100%) Exposed Structure Granulation Quality: Pink Fascia Exposed: No Necrotic Amount:  Small  (1-33%) Fat Layer (Subcutaneous Tissue) Exposed: Yes Necrotic Quality: Adherent Slough Tendon Exposed: No Muscle Exposed: No Joint Exposed: No Bone Exposed: No Electronic Signature(s) Signed: 05/16/2020 4:03:25 PM By: Levan Hurst RN, BSN Entered By: Levan Hurst on 04/26/2020 16:35:03 -------------------------------------------------------------------------------- [place] Details Patient Name: Date of Service: [name], [place] 04/26/2020 3:30 PM Medical Record Number: [id number] Patient Account Number: [id number] Date of Birth/Sex: Treating RN: 1960-12-02 (60 y.o. [name] Primary Care Provider: Micheline Rough Other Clinician: Referring Provider: Treating Provider/Extender: Vaughan Basta Weeks in Treatment: 10 Vital Signs Time Taken: 16:27 Temperature (F): 97.8 Height (in): 64 Pulse (bpm): 94 Weight (lbs): 278 Respiratory Rate (breaths/min): 18 Body Mass Index (BMI): 47.7 Blood Pressure (mmHg): 131/81 Reference Range: 80 - 120 mg / dl Electronic Signature(s) Signed: 04/27/2020 2:45:22 PM By: Sandre Kitty Entered By: Sandre Kitty on 04/26/2020 16:28:02

## 2020-05-17 ENCOUNTER — Encounter (HOSPITAL_BASED_OUTPATIENT_CLINIC_OR_DEPARTMENT_OTHER): Payer: BC Managed Care – PPO | Admitting: Physician Assistant

## 2020-05-17 ENCOUNTER — Other Ambulatory Visit: Payer: Self-pay

## 2020-05-17 DIAGNOSIS — L97822 Non-pressure chronic ulcer of other part of left lower leg with fat layer exposed: Secondary | ICD-10-CM | POA: Diagnosis not present

## 2020-05-17 NOTE — Progress Notes (Addendum)
[name], [name] ([id number]) Visit Report for 05/17/2020 Chief Complaint Document Details Patient Name: Date of Service: [name] DA 05/17/2020 3:00 PM Medical Record Number: [id number] Patient Account Number: [id number] Date of Birth/Sex: Treating RN: 1960-08-27 (60 y.o. [name] Primary Care Provider: Theodis Shove Other Clinician: Referring Provider: Treating Provider/Extender: [name] in Treatment: 13 Information Obtained from: Patient Chief Complaint Left LE Ulcer Electronic Signature(s) Signed: 05/17/2020 3:37:36 PM By: Lenda Kelp PA-C Entered By: Lenda Kelp on 05/17/2020 15:37:36 -------------------------------------------------------------------------------- HPI Details Patient Name: Date of Service: [name], [name] DA 05/17/2020 3:00 PM Medical Record Number: [id number] Patient Account Number: [id number] Date of Birth/Sex: Treating RN: 1961-01-03 (60 y.o. [name] Primary Care Provider: Theodis Shove Other Clinician: Referring Provider: Treating Provider/Extender: [name] in Treatment: 13 History of Present Illness HPI Description: 02/16/2020 upon evaluation today patient actually appears to be doing poorly in regard to her left medial lower extremity ulcer. This is actually an area that she tells me she has had intermittent issues with over the years although has been closed for some time she typically uses compression right now she has juxta lite compression wraps. With that being said she tells me that this nonetheless open several weeks/months ago and has been given her trouble since. She does have a history of chronic venous insufficiency she is seeing specialist for this in the past she has had an ablation as well as sclerotherapy. With that being said she also has hypertension chronically which is managed by her primary care provider. In general she seems to be worsening  overall with regard to the wound and states that she finally realized that she needed to come in and have somebody look at this and not continue to try to manage this on her own. No fevers, chills, nausea, vomiting, or diarrhea. 02/23/2020 on evaluation today patient appears to be doing well with regard to her wound. This is showing some signs of improvement which is great news still were not quite at the point where I would like to be as far as the overall appearance of the wound is concerned but I do believe this is better than last week. I do believe the Iodoflex is helping as well. 03/08/2020 upon evaluation today patient appears to be doing well with regard to her wound. She has been tolerating the dressing changes without complication. Fortunately I feel like she has made great progress with the Iodoflex but I feel like it may be the point rest to switch to something else possibly a collagen- based dressing at this time. 03/15/2020 upon evaluation today patient appears to be doing excellent in regard to her leg ulcer. She has been tolerating the dressing changes without complication. Fortunately there is no signs of active infection. Overall she is measuring a little bit smaller today which is great news. 03/22/2020 upon evaluation today patient appears to be doing well with regard to her wound. She has been tolerating the dressing changes without complication. Fortunately there is no signs of active infection at this time. 03/28/2020; patient I do not usually see however she has a wound on the left anterior lower leg secondary to chronic venous insufficiency we have been using silver collagen under compression. She arrives in clinic with a nonviable surface requiring debridement 04/12/2020 upon evaluation today patient appears to be doing well all things considered with regard to her leg ulcer. She is tolerating the dressing changes without complication there  is minimal dry skin around the edges of  the wound that may be trapping and stopping some of the events of the new skin I am can work on that today. Otherwise the surface of the wound appears to be doing excellent. 04/19/2020 upon evaluation today patient appears to be doing well with regard to her leg ulcer. She has been tolerating dressing changes without complication. Fortunately there is no signs of active infection at this time. No fever chills noted overall very pleased with how things seem to be progressing. 04/26/2020 on evaluation today patient appears to be doing well with regard to her wound currently. Is showing signs of excellent improvement overall is filling in nicely and there does not appear to be any signs of infection. No fevers, chills, nausea, vomiting, or diarrhea. 05/03/2020 upon evaluation today patient appears to be doing well with regard to her wound on the leg. This overall showing signs of good improvement which is great she has some good epithelial growth and overall I think that things are moving in the correct direction. We likewise going to continue with the wound care measures as before since she seems to making such good improvement. 2/3; venous wound on the left medial leg. This is contracting. We are using Prisma and 3 layer compression. She has a stocking and waiting in the eventuality this heals. She is already using it on the right 05/17/2020 upon evaluation today patient appears to be doing well with regard to her leg ulcer. She has been tolerating the dressing changes without complication. Fortunately there is no signs of active infection which is great news and overall very pleased with where things stand today. No fevers, chills, nausea, vomiting, or diarrhea. Electronic Signature(s) Signed: 05/17/2020 5:32:40 PM By: Lenda Kelp PA-C Entered By: Lenda Kelp on 05/17/2020 17:32:39 -------------------------------------------------------------------------------- Physical Exam Details Patient Name:  Date of Service: [place] DA 05/17/2020 3:00 PM Medical Record Number: [id number] Patient Account Number: [id number] Date of Birth/Sex: Treating RN: 11-15-1960 (60 y.o. [name] Primary Care Provider: Theodis Shove Other Clinician: Referring Provider: Treating Provider/Extender: [name] in Treatment: 13 Constitutional Well-nourished and well-hydrated in no acute distress. Respiratory normal breathing without difficulty. Psychiatric this patient is able to make decisions and demonstrates good insight into disease process. Alert and Oriented x 3. pleasant and cooperative. Notes Patient's wound currently is showing signs of good granulation epithelization. She has been tolerating the dressing changes including the compression wrap without complication. Overall I am extremely pleased with how she is progressing hopefully will be able to get this completely closed shortly. Electronic Signature(s) Signed: 05/17/2020 5:32:56 PM By: Lenda Kelp PA-C Entered By: Lenda Kelp on 05/17/2020 17:32:56 -------------------------------------------------------------------------------- Physician Orders Details Patient Name: Date of Service: [place], [name] DA 05/17/2020 3:00 PM Medical Record Number: [id number] Patient Account Number: [id number] Date of Birth/Sex: Treating RN: 09-29-1960 (60 y.o. [name] Primary Care Provider: Theodis Shove Other Clinician: Referring Provider: Treating Provider/Extender: [name] in Treatment: 2 Verbal / Phone Orders: No Diagnosis Coding ICD-10 Coding Code Description I87.2 Venous insufficiency (chronic) (peripheral) L97.822 Non-pressure chronic ulcer of other part of left lower leg with fat layer exposed I10 Essential (primary) hypertension Follow-up Appointments Return Appointment in 1 week. Bathing/ Shower/ Hygiene May shower with protection but do not get  wound dressing(s) wet. Edema Control - Lymphedema / SCD / Other Bilateral Lower Extremities Elevate legs to the level of the heart or  above for 30 minutes daily and/or when sitting, a frequency of: - throughout the day Avoid standing for long periods of time. Exercise regularly Wound Treatment Wound #1 - Lower Leg Wound Laterality: Left, Medial Peri-Wound Care: Sween Lotion (Moisturizing lotion) 1 x Per Week Discharge Instructions: Apply moisturizing lotion to leg Prim Dressing: Promogran Prisma Matrix, 4.34 (sq in) (silver collagen) 1 x Per Week ary Discharge Instructions: Moisten collagen with saline or hydrogel Secondary Dressing: Woven Gauze Sponge, Non-Sterile 4x4 in 1 x Per Week Discharge Instructions: Apply over primary dressing as directed. Compression Wrap: ThreePress (3 layer compression wrap) 1 x Per Week Discharge Instructions: Apply three layer compression , use unna layer at top to secure wrap Electronic Signature(s) Signed: 05/17/2020 5:10:26 PM By: [name] RN, BSN Signed: 05/17/2020 6:51:30 PM By: Lenda Kelp PA-C Entered By: [name] on 05/17/2020 16:37:04 -------------------------------------------------------------------------------- Problem List Details Patient Name: Date of Service: [name], [name] DA 05/17/2020 3:00 PM Medical Record Number: [id number] Patient Account Number: [id number] Date of Birth/Sex: Treating RN: 1960/11/08 (60 y.o. [name] Primary Care Provider: Theodis Shove Other Clinician: Referring Provider: Treating Provider/Extender: [name] in Treatment: 13 Active Problems ICD-10 Encounter Code Description Active Date MDM Diagnosis I87.2 Venous insufficiency (chronic) (peripheral) 02/16/2020 No Yes L97.822 Non-pressure chronic ulcer of other part of left lower leg with fat layer 02/16/2020 No Yes exposed I10 Essential (primary) hypertension 02/16/2020 No Yes Inactive  Problems Resolved Problems Electronic Signature(s) Signed: 05/17/2020 3:37:30 PM By: Lenda Kelp PA-C Entered By: Lenda Kelp on 05/17/2020 15:37:30 -------------------------------------------------------------------------------- Progress Note Details Patient Name: Date of Service: [place], [name] DA 05/17/2020 3:00 PM Medical Record Number: [id number] Patient Account Number: [id number] Date of Birth/Sex: Treating RN: 1961-02-24 (60 y.o. [name] Primary Care Provider: Theodis Shove Other Clinician: Referring Provider: Treating Provider/Extender: [name] in Treatment: 13 Subjective Chief Complaint Information obtained from Patient Left LE Ulcer History of Present Illness (HPI) 02/16/2020 upon evaluation today patient actually appears to be doing poorly in regard to her left medial lower extremity ulcer. This is actually an area that she tells me she has had intermittent issues with over the years although has been closed for some time she typically uses compression right now she has juxta lite compression wraps. With that being said she tells me that this nonetheless open several weeks/months ago and has been given her trouble since. She does have a history of chronic venous insufficiency she is seeing specialist for this in the past she has had an ablation as well as sclerotherapy. With that being said she also has hypertension chronically which is managed by her primary care provider. In general she seems to be worsening overall with regard to the wound and states that she finally realized that she needed to come in and have somebody look at this and not continue to try to manage this on her own. No fevers, chills, nausea, vomiting, or diarrhea. 02/23/2020 on evaluation today patient appears to be doing well with regard to her wound. This is showing some signs of improvement which is great news still were not quite at the point where I  would like to be as far as the overall appearance of the wound is concerned but I do believe this is better than last week. I do believe the Iodoflex is helping as well. 03/08/2020 upon evaluation today patient appears to be doing well with regard to her wound. She has been tolerating  the dressing changes without complication. Fortunately I feel like she has made great progress with the Iodoflex but I feel like it may be the point rest to switch to something else possibly a collagen- based dressing at this time. 03/15/2020 upon evaluation today patient appears to be doing excellent in regard to her leg ulcer. She has been tolerating the dressing changes without complication. Fortunately there is no signs of active infection. Overall she is measuring a little bit smaller today which is great news. 03/22/2020 upon evaluation today patient appears to be doing well with regard to her wound. She has been tolerating the dressing changes without complication. Fortunately there is no signs of active infection at this time. 03/28/2020; patient I do not usually see however she has a wound on the left anterior lower leg secondary to chronic venous insufficiency we have been using silver collagen under compression. She arrives in clinic with a nonviable surface requiring debridement 04/12/2020 upon evaluation today patient appears to be doing well all things considered with regard to her leg ulcer. She is tolerating the dressing changes without complication there is minimal dry skin around the edges of the wound that may be trapping and stopping some of the events of the new skin I am can work on that today. Otherwise the surface of the wound appears to be doing excellent. 04/19/2020 upon evaluation today patient appears to be doing well with regard to her leg ulcer. She has been tolerating dressing changes without complication. Fortunately there is no signs of active infection at this time. No fever chills noted  overall very pleased with how things seem to be progressing. 04/26/2020 on evaluation today patient appears to be doing well with regard to her wound currently. Is showing signs of excellent improvement overall is filling in nicely and there does not appear to be any signs of infection. No fevers, chills, nausea, vomiting, or diarrhea. 05/03/2020 upon evaluation today patient appears to be doing well with regard to her wound on the leg. This overall showing signs of good improvement which is great she has some good epithelial growth and overall I think that things are moving in the correct direction. We likewise going to continue with the wound care measures as before since she seems to making such good improvement. 2/3; venous wound on the left medial leg. This is contracting. We are using Prisma and 3 layer compression. She has a stocking and waiting in the eventuality this heals. She is already using it on the right 05/17/2020 upon evaluation today patient appears to be doing well with regard to her leg ulcer. She has been tolerating the dressing changes without complication. Fortunately there is no signs of active infection which is great news and overall very pleased with where things stand today. No fevers, chills, nausea, vomiting, or diarrhea. Objective Constitutional Well-nourished and well-hydrated in no acute distress. Vitals Time Taken: 3:15 PM, Height: 64 in, Weight: 278 lbs, BMI: 47.7, Temperature: 98.2 F, Pulse: 94 bpm, Respiratory Rate: 18 breaths/min, Blood Pressure: 134/83 mmHg. Respiratory normal breathing without difficulty. Psychiatric this patient is able to make decisions and demonstrates good insight into disease process. Alert and Oriented x 3. pleasant and cooperative. General Notes: Patient's wound currently is showing signs of good granulation epithelization. She has been tolerating the dressing changes including the compression wrap without complication. Overall I am  extremely pleased with how she is progressing hopefully will be able to get this completely closed shortly. Integumentary (Hair, Skin) Wound #1 status  is Open. Original cause of wound was Gradually Appeared. The wound is located on the Left,Medial Lower Leg. The wound measures 0.7cm length x 0.3cm width x 0.1cm depth; 0.165cm^2 area and 0.016cm^3 volume. There is Fat Layer (Subcutaneous Tissue) exposed. There is no tunneling or undermining noted. There is a small amount of serosanguineous drainage noted. The wound margin is distinct with the outline attached to the wound base. There is large (67-100%) pink granulation within the wound bed. There is no necrotic tissue within the wound bed. Assessment Active Problems ICD-10 Venous insufficiency (chronic) (peripheral) Non-pressure chronic ulcer of other part of left lower leg with fat layer exposed Essential (primary) hypertension Procedures Wound #1 Pre-procedure diagnosis of Wound #1 is a Venous Leg Ulcer located on the Left,Medial Lower Leg . There was a Three Layer Compression Therapy Procedure by Fonnie Mu, RN. Post procedure Diagnosis Wound #1: Same as Pre-Procedure Plan Follow-up Appointments: Return Appointment in 1 week. Bathing/ Shower/ Hygiene: May shower with protection but do not get wound dressing(s) wet. Edema Control - Lymphedema / SCD / Other: Elevate legs to the level of the heart or above for 30 minutes daily and/or when sitting, a frequency of: - throughout the day Avoid standing for long periods of time. Exercise regularly WOUND #1: - Lower Leg Wound Laterality: Left, Medial Peri-Wound Care: Sween Lotion (Moisturizing lotion) 1 x Per Week/ Discharge Instructions: Apply moisturizing lotion to leg Prim Dressing: Promogran Prisma Matrix, 4.34 (sq in) (silver collagen) 1 x Per Week/ ary Discharge Instructions: Moisten collagen with saline or hydrogel Secondary Dressing: Woven Gauze Sponge, Non-Sterile 4x4 in 1 x  Per Week/ Discharge Instructions: Apply over primary dressing as directed. Com pression Wrap: ThreePress (3 layer compression wrap) 1 x Per Week/ Discharge Instructions: Apply three layer compression , use unna layer at top to secure wrap 1. Would recommend currently that we go ahead and continue with the wound care measures as before and patient is in agreement with plan that includes the use of the the silver collagen dressing that we will not need to utilize the drawtex and behind as there is no longer any depth to the wound really. 2. I am also can recommend we continue with 3 layer compression wrap that seems to be doing well for the patient. We will see patient back for reevaluation in 1 week here in the clinic. If anything worsens or changes patient will contact our office for additional recommendations. Electronic Signature(s) Signed: 05/17/2020 5:34:57 PM By: Lenda Kelp PA-C Entered By: Lenda Kelp on 05/17/2020 17:34:56 -------------------------------------------------------------------------------- SuperBill Details Patient Name: Date of Service: [place], [name] DA 05/17/2020 Medical Record Number: [id number] Patient Account Number: [id number] Date of Birth/Sex: Treating RN: 11-Jul-1960 (60 y.o. [name] Primary Care Provider: Theodis Shove Other Clinician: Referring Provider: Treating Provider/Extender: [name] in Treatment: 13 Diagnosis Coding ICD-10 Codes Code Description I87.2 Venous insufficiency (chronic) (peripheral) L97.822 Non-pressure chronic ulcer of other part of left lower leg with fat layer exposed I10 Essential (primary) hypertension Facility Procedures CPT4 Code: [id number] Description: (Facility Use Only) [phone] - APPLY MULTLAY COMPRS LWR LT LEG Modifier: Quantity: 1 Physician Procedures : CPT4 Code Description Modifier [id number] - WC PHYS LEVEL 3 - EST PT ICD-10 Diagnosis Description I87.2 Venous  insufficiency (chronic) (peripheral) L97.822 Non-pressure chronic ulcer of other part of left lower leg with fat layer exposed I10  Essential (primary) hypertension Quantity: 1 Electronic Signature(s) Signed: 05/17/2020 5:35:12 PM By: Lenda Kelp PA-C Previous  Signature: 05/17/2020 5:10:26 PM Version By: Zenaida DeedBoehlein, [name] RN, BSN Entered By: Lenda KelpStone III, Geoffery Aultman on 05/17/2020 17:35:11

## 2020-05-17 NOTE — Telephone Encounter (Signed)
Pt is calling in needing a refill on Rx hydrochlorothiazide (HYDRODIURIL) 50 MG and losartan (COZAAR) 100 MG  Pharm:  [facility] on [address]

## 2020-05-18 NOTE — Progress Notes (Signed)
[name], [name] ([id number]) Visit Report for 05/17/2020 Arrival Information Details Patient Name: Date of Service: [name] DA 05/17/2020 3:00 PM Medical Record Number: [id number] Patient Account Number: [id number] Date of Birth/Sex: Treating RN: 1960/11/30 (60 y.o. [name], [name] Primary Care [name]: Micheline [name] Other Clinician: Referring [name]: Treating [name]/Extender: [name]: 13 Visit Information History Since Last Visit Added or deleted any medications: No Patient Arrived: Ambulatory Any new allergies or adverse reactions: No Arrival Time: 15:13 Had a fall or experienced change in No Accompanied By: self activities of daily living that may affect Transfer Assistance: None risk of falls: Patient Identification Verified: Yes Signs or symptoms of abuse/neglect since last visito No Secondary Verification Process Completed: Yes Hospitalized since last visit: No Patient Requires Transmission-Based Precautions: No Implantable device outside of the clinic excluding No Patient Has Alerts: Yes cellular tissue based products placed in the center Patient Alerts: ABI noncompressible since last visit: Has Dressing in Place as Prescribed: Yes Pain Present Now: No Electronic Signature(s) Signed: 05/18/2020 8:34:45 AM By: Sandre Kitty Entered By: Sandre Kitty on 05/17/2020 15:14:50 -------------------------------------------------------------------------------- Compression Therapy Details Patient Name: Date of Service: [name], [name] 05/17/2020 3:00 PM Medical Record Number: [id number] Patient Account Number: [id number] Date of Birth/Sex: Treating RN: 01-18-1961 (60 y.o. [name]: [name] Other Clinician: Referring [name]: Treating [name]/Extender: [name]: 13 Compression Therapy Performed for Wound Assessment: Wound #1  Left,Medial Lower Leg Performed By: Clinician [name] Hammock, RN Compression Type: Three Layer Post Procedure Diagnosis Same as Pre-procedure Electronic Signature(s) Signed: 05/17/2020 5:10:26 PM By: Baruch Gouty RN, BSN Entered By: Baruch Gouty on 05/17/2020 16:36:12 -------------------------------------------------------------------------------- Encounter Discharge Information Details Patient Name: Date of Service: [name], [name] 05/17/2020 3:00 PM Medical Record Number: [id number] Patient Account Number: [id number] Date of Birth/Sex: Treating RN: 10/15/1960 (60 y.o. [name] Primary Care [name]: Micheline [name] Other Clinician: Referring [name]: Treating [name]/Extender: [name] in [name]: 13 Encounter Discharge Information Items Discharge Condition: Stable Ambulatory Status: Ambulatory Discharge Destination: Home Transportation: Private Auto Accompanied By: self Schedule Follow-up Appointment: Yes Clinical Summary of Care: Electronic Signature(s) Signed: 05/17/2020 4:51:20 PM By: Deon Pilling Entered By: Deon Pilling on 05/17/2020 16:40:41 -------------------------------------------------------------------------------- Lower Extremity Assessment Details Patient Name: Date of Service: [name] DA 05/17/2020 3:00 PM Medical Record Number: [id number] Patient Account Number: [id number] Date of Birth/Sex: Treating RN: [birth year] (60 y.o. [name] Primary Care [name]: Micheline [name] Other Clinician: Referring [name]: Treating [name]/Extender: [name]: 13 Edema Assessment Assessed: [Left: Yes] [Right: No] Edema: [Left: Ye] [Right: s] Calf Left: Right: Point of Measurement: 35 cm From Medial Instep 41 cm Ankle Left: Right: Point of Measurement: 9 cm From Medial Instep 23 cm Vascular Assessment Pulses: Dorsalis Pedis Palpable:  [Left:Yes] Electronic Signature(s) Signed: 05/17/2020 4:51:20 PM By: Deon Pilling Entered By: Deon Pilling on 05/17/2020 15:20:20 -------------------------------------------------------------------------------- [name] Details Patient Name: Date of Service: [name], [name] 05/17/2020 3:00 PM Medical Record Number: [id number] Patient Account Number: [id number] Date of Birth/Sex: Treating RN: 16-Dec-1960 (60 y.o. [name]: [name] Other Clinician: Referring [name]: Treating [name]/Extender: [name]: 13 Active Inactive Venous Leg Ulcer Nursing Diagnoses: Knowledge deficit related to disease process and management Potential for venous Insuffiency (use before diagnosis confirmed) Goals: Patient will maintain optimal edema control Date Initiated: 02/16/2020 Target  Resolution Date: 07/07/2020 Goal Status: Active Interventions: Assess peripheral edema status every visit. Compression as ordered Provide education on venous insufficiency [name] Activities: Therapeutic compression applied : 02/16/2020 Notes: Wound/Skin Impairment Nursing Diagnoses: Impaired tissue integrity Knowledge deficit related to ulceration/compromised skin integrity Goals: Patient/caregiver will verbalize understanding of skin care regimen Date Initiated: 02/16/2020 Target Resolution Date: 06/09/2020 Goal Status: Active Ulcer/skin breakdown will have a volume reduction of 30% by week 4 Date Initiated: 02/16/2020 Date Inactivated: 03/22/2020 Target Resolution Date: 03/15/2020 Goal Status: Met Ulcer/skin breakdown will have a volume reduction of 50% by week 8 Date Initiated: 03/22/2020 Date Inactivated: 04/19/2020 Target Resolution Date: 04/19/2020 Goal Status: Met Ulcer/skin breakdown will have a volume reduction of 80% by week 12 Date Initiated: 04/19/2020 Date Inactivated: 05/17/2020 Target  Resolution Date: 05/17/2020 Goal Status: Met Ulcer/skin breakdown will heal within 14 [name] Date Initiated: 05/17/2020 Target Resolution Date: 05/31/2020 Goal Status: Active Interventions: Assess patient/caregiver ability to obtain necessary supplies Assess patient/caregiver ability to perform ulcer/skin care regimen upon admission and as needed Assess ulceration(s) every visit Provide education on ulcer and skin care [name] Activities: Skin care regimen initiated : 02/16/2020 Topical wound management initiated : 02/16/2020 Notes: Electronic Signature(s) Signed: 05/17/2020 5:10:26 PM By: Baruch Gouty RN, BSN Entered By: Baruch Gouty on 05/17/2020 16:34:16 -------------------------------------------------------------------------------- Pain Assessment Details Patient Name: Date of Service: [name], [place] 05/17/2020 3:00 PM Medical Record Number: [id number] Patient Account Number: [id number] Date of Birth/Sex: Treating RN: [birth year] (60 y.o. [name]: [name] Other Clinician: Referring [name]: Treating [name]/Extender: [name]: 13 Active Problems Location of Pain Severity and Description of Pain Patient Has Paino No Site Locations Pain Management and Medication Current Pain Management: Electronic Signature(s) Signed: 05/17/2020 5:10:26 PM By: Baruch Gouty RN, BSN Signed: 05/18/2020 8:34:45 AM By: Sandre Kitty Entered By: Sandre Kitty on 05/17/2020 15:15:59 -------------------------------------------------------------------------------- Patient/Caregiver Education Details Patient Name: Date of Service: [name], [name] DA 2/9/2022andnbsp3:00 PM Medical Record Number: [id number] Patient Account Number: [id number] Date of Birth/Gender: Treating RN: [birth year] (60 y.o. [name] Primary Care Physician: [name] Other Clinician: Referring  Physician: Treating Physician/Extender: [name] in [name]: 13 Education Assessment Education Provided To: Patient Education Topics Provided Venous: Methods: Explain/Verbal Responses: Reinforcements needed, State content correctly Wound/Skin Impairment: Methods: Explain/Verbal Responses: Reinforcements needed, State content correctly Electronic Signature(s) Signed: 05/17/2020 5:10:26 PM By: Baruch Gouty RN, BSN Entered By: Baruch Gouty on 05/17/2020 16:34:38 -------------------------------------------------------------------------------- Wound Assessment Details Patient Name: Date of Service: [name], [place] 05/17/2020 3:00 PM Medical Record Number: [id number] Patient Account Number: [id number] Date of Birth/Sex: Treating RN: 08/30/1960 (60 y.o. [name]: [name] Other Clinician: Referring [name]: Treating [name]/Extender: [name]: 13 Wound Status Wound Number: 1 Primary Venous Leg Ulcer Etiology: Wound Location: Left, Medial Lower Leg Wound Status: Open Wounding Event: Gradually Appeared Comorbid Anemia, Sleep Apnea, Hypertension, Peripheral Venous Date Acquired: 01/26/2020 History: Disease [name] Of [name]: 13 Clustered Wound: No Wound Measurements Length: (cm) 0.7 Width: (cm) 0.3 Depth: (cm) 0.1 Area: (cm) 0.165 Volume: (cm) 0.016 % Reduction in Area: 95.1% % Reduction in Volume: 98.4% Epithelialization: Large (67-100%) Tunneling: No Undermining: No Wound Description Classification: Full Thickness Without Exposed Support Structures Wound Margin: Distinct, outline attached Exudate Amount: Small Exudate Type: Serosanguineous Exudate Color: red, brown Foul Odor After Cleansing: No Slough/Fibrino No Wound Bed Granulation Amount: Large (67-100%) Exposed Structure Granulation Quality: Pink Fascia Exposed: No  Necrotic Amount:  None Present (0%) Fat Layer (Subcutaneous Tissue) Exposed: Yes Tendon Exposed: No Muscle Exposed: No Joint Exposed: No Bone Exposed: No [name] Notes Wound #1 (Lower Leg) Wound Laterality: Left, Medial Cleanser Peri-Wound Care Sween Lotion (Moisturizing lotion) Discharge Instruction: Apply moisturizing lotion to leg Topical Primary Dressing Promogran Prisma Matrix, 4.34 (sq in) (silver collagen) Discharge Instruction: Moisten collagen with saline or hydrogel Secondary Dressing Woven Gauze Sponge, Non-Sterile 4x4 in Discharge Instruction: Apply over primary dressing as directed. Secured With Compression Wrap ThreePress (3 layer compression wrap) Discharge Instruction: Apply three layer compression , use unna layer at top to secure wrap Compression Stockings Add-Ons Electronic Signature(s) Signed: 05/17/2020 5:10:26 PM By: Baruch Gouty RN, BSN Entered By: Baruch Gouty on 05/17/2020 16:35:50 -------------------------------------------------------------------------------- Vitals Details Patient Name: Date of Service: [name], [place] 05/17/2020 3:00 PM Medical Record Number: [id number] Patient Account Number: [id number] Date of Birth/Sex: Treating RN: Oct 26, 1960 (60 y.o. [name]: [name] Other Clinician: Referring [name]: Treating [name]/Extender: [name]: 13 Vital Signs Time Taken: 15:15 Temperature (F): 98.2 Height (in): 64 Pulse (bpm): 94 Weight (lbs): 278 Respiratory Rate (breaths/min): 18 Body Mass Index (BMI): 47.7 Blood Pressure (mmHg): 134/83 Reference Range: 80 - 120 mg / dl Electronic Signature(s) Signed: 05/18/2020 8:34:45 AM By: Sandre Kitty Entered By: Sandre Kitty on 05/17/2020 15:15:53

## 2020-05-24 ENCOUNTER — Encounter (HOSPITAL_BASED_OUTPATIENT_CLINIC_OR_DEPARTMENT_OTHER): Payer: BC Managed Care – PPO | Admitting: Physician Assistant

## 2020-05-24 ENCOUNTER — Other Ambulatory Visit: Payer: Self-pay

## 2020-05-24 DIAGNOSIS — L97822 Non-pressure chronic ulcer of other part of left lower leg with fat layer exposed: Secondary | ICD-10-CM | POA: Diagnosis not present

## 2020-05-24 NOTE — Progress Notes (Addendum)
[name], [name] ([id number]) Visit Report for 05/24/2020 Chief Complaint Document Details Patient Name: Date of Service: [name] [name] 05/24/2020 3:30 PM Medical Record Number: [id number] Patient Account Number: [id number] Date of Birth/Sex: Treating RN: 27-Apr-1960 (60 y.o. [name] Primary Care Provider: Theodis Shove Other Clinician: Referring Provider: Treating Provider/Extender: [name] in Treatment: 14 Information Obtained from: Patient Chief Complaint Left LE Ulcer Electronic Signature(s) Signed: 05/24/2020 3:37:28 PM By: Lenda Kelp PA-C Entered By: Lenda Kelp on 05/24/2020 15:37:28 -------------------------------------------------------------------------------- Debridement Details Patient Name: Date of Service: [name], [name] [name] 05/24/2020 3:30 PM Medical Record Number: [id number] Patient Account Number: [id number] Date of Birth/Sex: Treating RN: Dec 12, 1960 (60 y.o. [name] Primary Care Provider: Theodis Shove Other Clinician: Referring Provider: Treating Provider/Extender: [name] in Treatment: 14 Debridement Performed for Assessment: Wound #1 Left,Medial Lower Leg Performed By: Physician Lenda Kelp, PA Debridement Type: Debridement Severity of Tissue Pre Debridement: Fat layer exposed Level of Consciousness (Pre-procedure): Awake and Alert Pre-procedure Verification/Time Out Yes - 17:05 Taken: Start Time: 17:05 Pain Control: Lidocaine 5% topical ointment T Area Debrided (L x W): otal 0.5 (cm) x 0.3 (cm) = 0.15 (cm) Tissue and other material debrided: Non-Viable, Skin: Epidermis, Fibrin/Exudate Level: Skin/Epidermis Debridement Description: Selective/Open Wound Instrument: Curette Bleeding: Minimum Hemostasis Achieved: Pressure End Time: 17:10 Procedural Pain: 0 Post Procedural Pain: 0 Response to Treatment: Procedure was tolerated well Level of  Consciousness (Post- Awake and Alert procedure): Post Debridement Measurements of Total Wound Length: (cm) 0.5 Width: (cm) 0.3 Depth: (cm) 0.1 Volume: (cm) 0.012 Character of Wound/Ulcer Post Debridement: Improved Severity of Tissue Post Debridement: Fat layer exposed Post Procedure Diagnosis Same as Pre-procedure Electronic Signature(s) Signed: 05/24/2020 5:28:15 PM By: Lenda Kelp PA-C Signed: 05/24/2020 6:01:36 PM By: [name] RN, BSN Entered By: [name] on 05/24/2020 17:11:10 -------------------------------------------------------------------------------- HPI Details Patient Name: Date of Service: [name], [name] [name] 05/24/2020 3:30 PM Medical Record Number: [id number] Patient Account Number: [id number] Date of Birth/Sex: Treating RN: 07-Sep-1960 (60 y.o. [name] Primary Care Provider: Theodis Shove Other Clinician: Referring Provider: Treating Provider/Extender: [name] in Treatment: 14 History of Present Illness HPI Description: 02/16/2020 upon evaluation today patient actually appears to be doing poorly in regard to her left medial lower extremity ulcer. This is actually an area that she tells me she has had intermittent issues with over the years although has been closed for some time she typically uses compression right now she has juxta lite compression wraps. With that being said she tells me that this nonetheless open several weeks/months ago and has been given her trouble since. She does have a history of chronic venous insufficiency she is seeing specialist for this in the past she has had an ablation as well as sclerotherapy. With that being said she also has hypertension chronically which is managed by her primary care provider. In general she seems to be worsening overall with regard to the wound and states that she finally realized that she needed to come in and have somebody look at this and not continue  to try to manage this on her own. No fevers, chills, nausea, vomiting, or diarrhea. 02/23/2020 on evaluation today patient appears to be doing well with regard to her wound. This is showing some signs of improvement which is great news still were not quite at the point where I would like to be as far as the overall appearance of  the wound is concerned but I do believe this is better than last week. I do believe the Iodoflex is helping as well. 03/08/2020 upon evaluation today patient appears to be doing well with regard to her wound. She has been tolerating the dressing changes without complication. Fortunately I feel like she has made great progress with the Iodoflex but I feel like it may be the point rest to switch to something else possibly a collagen- based dressing at this time. 03/15/2020 upon evaluation today patient appears to be doing excellent in regard to her leg ulcer. She has been tolerating the dressing changes without complication. Fortunately there is no signs of active infection. Overall she is measuring a little bit smaller today which is great news. 03/22/2020 upon evaluation today patient appears to be doing well with regard to her wound. She has been tolerating the dressing changes without complication. Fortunately there is no signs of active infection at this time. 03/28/2020; patient I do not usually see however she has a wound on the left anterior lower leg secondary to chronic venous insufficiency we have been using silver collagen under compression. She arrives in clinic with a nonviable surface requiring debridement 04/12/2020 upon evaluation today patient appears to be doing well all things considered with regard to her leg ulcer. She is tolerating the dressing changes without complication there is minimal dry skin around the edges of the wound that may be trapping and stopping some of the events of the new skin I am can work on that today. Otherwise the surface of the wound  appears to be doing excellent. 04/19/2020 upon evaluation today patient appears to be doing well with regard to her leg ulcer. She has been tolerating dressing changes without complication. Fortunately there is no signs of active infection at this time. No fever chills noted overall very pleased with how things seem to be progressing. 04/26/2020 on evaluation today patient appears to be doing well with regard to her wound currently. Is showing signs of excellent improvement overall is filling in nicely and there does not appear to be any signs of infection. No fevers, chills, nausea, vomiting, or diarrhea. 05/03/2020 upon evaluation today patient appears to be doing well with regard to her wound on the leg. This overall showing signs of good improvement which is great she has some good epithelial growth and overall I think that things are moving in the correct direction. We likewise going to continue with the wound care measures as before since she seems to making such good improvement. 2/3; venous wound on the left medial leg. This is contracting. We are using Prisma and 3 layer compression. She has a stocking and waiting in the eventuality this heals. She is already using it on the right 05/17/2020 upon evaluation today patient appears to be doing well with regard to her leg ulcer. She has been tolerating the dressing changes without complication. Fortunately there is no signs of active infection which is great news and overall very pleased with where things stand today. No fevers, chills, nausea, vomiting, or diarrhea. 05/24/2020 upon evaluation today patient appears to be doing well with regard to her wound. Overall I feel like she is making excellent progress. There does not appear to be any signs of active infection which is great news. Electronic Signature(s) Signed: 05/24/2020 5:19:12 PM By: Lenda Kelp PA-C Entered By: Lenda Kelp on 05/24/2020  17:19:12 -------------------------------------------------------------------------------- Physical Exam Details Patient Name: Date of Service: [name], [name] [name] 05/24/2020 3:30  PM Medical Record Number: [id number] Patient Account Number: [id number] Date of Birth/Sex: Treating RN: 11-05-1960 (60 y.o. [name] Primary Care Provider: Theodis Shove Other Clinician: Referring Provider: Treating Provider/Extender: [name] in Treatment: 14 Constitutional Well-nourished and well-hydrated in no acute distress. Respiratory normal breathing without difficulty. Psychiatric this patient is able to make decisions and demonstrates good insight into disease process. Alert and Oriented x 3. pleasant and cooperative. Notes Patient's wound bed showed signs of some dry skin around the edges otherwise she does appear to have made some progress there is a little bit of slough noted I did clear this away but to be honest she did not have anything that appeared to be significantly open there is a pinpoint area weeping right in the center and everything else was new skin this appears to be doing excellent. Electronic Signature(s) Signed: 05/24/2020 5:19:34 PM By: Lenda Kelp PA-C Entered By: Lenda Kelp on 05/24/2020 17:19:33 -------------------------------------------------------------------------------- Physician Orders Details Patient Name: Date of Service: [name], [name] [name] 05/24/2020 3:30 PM Medical Record Number: [id number] Patient Account Number: [id number] Date of Birth/Sex: Treating RN: 1961-01-04 (60 y.o. [name] Primary Care Provider: Theodis Shove Other Clinician: Referring Provider: Treating Provider/Extender: [name] in Treatment: [phone] Verbal / Phone Orders: No Diagnosis Coding ICD-10 Coding Code Description I87.2 Venous insufficiency (chronic) (peripheral) L97.822 Non-pressure chronic ulcer of  other part of left lower leg with fat layer exposed I10 Essential (primary) hypertension Follow-up Appointments Return Appointment in 1 week. Bathing/ Shower/ Hygiene May shower with protection but do not get wound dressing(s) wet. Edema Control - Lymphedema / SCD / Other Bilateral Lower Extremities Elevate legs to the level of the heart or above for 30 minutes daily and/or when sitting, a frequency of: - throughout the day Avoid standing for long periods of time. Exercise regularly Wound Treatment Wound #1 - Lower Leg Wound Laterality: Left, Medial Peri-Wound Care: Sween Lotion (Moisturizing lotion) 1 x Per Week Discharge Instructions: Apply moisturizing lotion to leg Prim Dressing: KerraCel Ag Gelling Fiber Dressing, 2x2 in (silver alginate) 1 x Per Week ary Discharge Instructions: Apply silver alginate to wound bed as instructed Secondary Dressing: Woven Gauze Sponge, Non-Sterile 4x4 in 1 x Per Week Discharge Instructions: Apply over primary dressing as directed. Compression Wrap: ThreePress (3 layer compression wrap) 1 x Per Week Discharge Instructions: Apply three layer compression , use unna layer at top to secure wrap Electronic Signature(s) Signed: 05/24/2020 5:28:15 PM By: Lenda Kelp PA-C Signed: 05/24/2020 6:01:36 PM By: [name] RN, BSN Entered By: [name] on 05/24/2020 17:12:13 -------------------------------------------------------------------------------- Problem List Details Patient Name: Date of Service: [name], [name] [name] 05/24/2020 3:30 PM Medical Record Number: [id number] Patient Account Number: [id number] Date of Birth/Sex: Treating RN: [birth year] (60 y.o. [name] Primary Care Provider: Theodis Shove Other Clinician: Referring Provider: Treating Provider/Extender: [name] in Treatment: [phone] Active Problems ICD-10 Encounter Code Description Active Date MDM Diagnosis I87.2 Venous insufficiency  (chronic) (peripheral) 02/16/2020 No Yes L97.822 Non-pressure chronic ulcer of other part of left lower leg with fat layer 02/16/2020 No Yes exposed I10 Essential (primary) hypertension 02/16/2020 No Yes Inactive Problems Resolved Problems Electronic Signature(s) Signed: 05/24/2020 3:37:23 PM By: Lenda Kelp PA-C Entered By: Lenda Kelp on 05/24/2020 15:37:23 -------------------------------------------------------------------------------- Progress Note Details Patient Name: Date of Service: [name], [name] [name] 05/24/2020 3:30 PM Medical Record Number: [id number] Patient Account Number: [id number] Date of Birth/Sex: Treating  RN: 10/21/1960 ([age] y.o. [name]) Boehlein, [name] Primary Care Provider: Theodis ShoveKoberlein, Junell Other Clinician: Referring Provider: Treating Provider/Extender: Esaw DaceStone III, Meloney Feld Koberlein, Junell Weeks in Treatment: 14 Subjective Chief Complaint Information obtained from Patient Left LE Ulcer History of Present Illness (HPI) 02/16/2020 upon evaluation today patient actually appears to be doing poorly in regard to her left medial lower extremity ulcer. This is actually an area that she tells me she has had intermittent issues with over the years although has been closed for some time she typically uses compression right now she has juxta lite compression wraps. With that being said she tells me that this nonetheless open several weeks/months ago and has been given her trouble since. She does have a history of chronic venous insufficiency she is seeing specialist for this in the past she has had an ablation as well as sclerotherapy. With that being said she also has hypertension chronically which is managed by her primary care provider. In general she seems to be worsening overall with regard to the wound and states that she finally realized that she needed to come in and have somebody look at this and not continue to try to manage this on her own. No fevers, chills, nausea,  vomiting, or diarrhea. 02/23/2020 on evaluation today patient appears to be doing well with regard to her wound. This is showing some signs of improvement which is great news still were not quite at the point where I would like to be as far as the overall appearance of the wound is concerned but I do believe this is better than last week. I do believe the Iodoflex is helping as well. 03/08/2020 upon evaluation today patient appears to be doing well with regard to her wound. She has been tolerating the dressing changes without complication. Fortunately I feel like she has made great progress with the Iodoflex but I feel like it may be the point rest to switch to something else possibly a collagen- based dressing at this time. 03/15/2020 upon evaluation today patient appears to be doing excellent in regard to her leg ulcer. She has been tolerating the dressing changes without complication. Fortunately there is no signs of active infection. Overall she is measuring a little bit smaller today which is great news. 03/22/2020 upon evaluation today patient appears to be doing well with regard to her wound. She has been tolerating the dressing changes without complication. Fortunately there is no signs of active infection at this time. 03/28/2020; patient I do not usually see however she has a wound on the left anterior lower leg secondary to chronic venous insufficiency we have been using silver collagen under compression. She arrives in clinic with a nonviable surface requiring debridement 04/12/2020 upon evaluation today patient appears to be doing well all things considered with regard to her leg ulcer. She is tolerating the dressing changes without complication there is minimal dry skin around the edges of the wound that may be trapping and stopping some of the events of the new skin I am can work on that today. Otherwise the surface of the wound appears to be doing excellent. 04/19/2020 upon evaluation  today patient appears to be doing well with regard to her leg ulcer. She has been tolerating dressing changes without complication. Fortunately there is no signs of active infection at this time. No fever chills noted overall very pleased with how things seem to be progressing. 04/26/2020 on evaluation today patient appears to be doing well with regard to her wound currently.  Is showing signs of excellent improvement overall is filling in nicely and there does not appear to be any signs of infection. No fevers, chills, nausea, vomiting, or diarrhea. 05/03/2020 upon evaluation today patient appears to be doing well with regard to her wound on the leg. This overall showing signs of good improvement which is great she has some good epithelial growth and overall I think that things are moving in the correct direction. We likewise going to continue with the wound care measures as before since she seems to making such good improvement. 2/3; venous wound on the left medial leg. This is contracting. We are using Prisma and 3 layer compression. She has a stocking and waiting in the eventuality this heals. She is already using it on the right 05/17/2020 upon evaluation today patient appears to be doing well with regard to her leg ulcer. She has been tolerating the dressing changes without complication. Fortunately there is no signs of active infection which is great news and overall very pleased with where things stand today. No fevers, chills, nausea, vomiting, or diarrhea. 05/24/2020 upon evaluation today patient appears to be doing well with regard to her wound. Overall I feel like she is making excellent progress. There does not appear to be any signs of active infection which is great news. Objective Constitutional Well-nourished and well-hydrated in no acute distress. Vitals Time Taken: 3:48 PM, Height: 64 in, Weight: 278 lbs, BMI: 47.7, Temperature: 98 F, Pulse: 96 bpm, Respiratory Rate: 20 breaths/min,  Blood Pressure: 138/80 mmHg. Respiratory normal breathing without difficulty. Psychiatric this patient is able to make decisions and demonstrates good insight into disease process. Alert and Oriented x 3. pleasant and cooperative. General Notes: Patient's wound bed showed signs of some dry skin around the edges otherwise she does appear to have made some progress there is a little bit of slough noted I did clear this away but to be honest she did not have anything that appeared to be significantly open there is a pinpoint area weeping right in the center and everything else was new skin this appears to be doing excellent. Integumentary (Hair, Skin) Wound #1 status is Open. Original cause of wound was Gradually Appeared. The wound is located on the Left,Medial Lower Leg. The wound measures 0.5cm length x 0.3cm width x 0.1cm depth; 0.118cm^2 area and 0.012cm^3 volume. There is Fat Layer (Subcutaneous Tissue) exposed. There is no tunneling or undermining noted. There is a small amount of serosanguineous drainage noted. The wound margin is distinct with the outline attached to the wound base. There is large (67-100%) pink granulation within the wound bed. There is a small (1-33%) amount of necrotic tissue within the wound bed including Adherent Slough. Assessment Active Problems ICD-10 Venous insufficiency (chronic) (peripheral) Non-pressure chronic ulcer of other part of left lower leg with fat layer exposed Essential (primary) hypertension Procedures Wound #1 Pre-procedure diagnosis of Wound #1 is a Venous Leg Ulcer located on the Left,Medial Lower Leg .Severity of Tissue Pre Debridement is: Fat layer exposed. There was a Selective/Open Wound Skin/Epidermis Debridement with a total area of 0.15 sq cm performed by Lenda Kelp, PA. With the following instrument(s): Curette to remove Non-Viable tissue/material. Material removed includes Skin: Epidermis and Fibrin/Exudate and after achieving  pain control using Lidocaine 5% topical ointment. No specimens were taken. A time out was conducted at 17:05, prior to the start of the procedure. A Minimum amount of bleeding was controlled with Pressure. The procedure was tolerated well with a  pain level of 0 throughout and a pain level of 0 following the procedure. Post Debridement Measurements: 0.5cm length x 0.3cm width x 0.1cm depth; 0.012cm^3 volume. Character of Wound/Ulcer Post Debridement is improved. Severity of Tissue Post Debridement is: Fat layer exposed. Post procedure Diagnosis Wound #1: Same as Pre-Procedure Pre-procedure diagnosis of Wound #1 is a Venous Leg Ulcer located on the Left,Medial Lower Leg . There was a Three Layer Compression Therapy Procedure by Fonnie Mu, RN. Post procedure Diagnosis Wound #1: Same as Pre-Procedure Plan Follow-up Appointments: Return Appointment in 1 week. Bathing/ Shower/ Hygiene: May shower with protection but do not get wound dressing(s) wet. Edema Control - Lymphedema / SCD / Other: Elevate legs to the level of the heart or above for 30 minutes daily and/or when sitting, a frequency of: - throughout the day Avoid standing for long periods of time. Exercise regularly WOUND #1: - Lower Leg Wound Laterality: Left, Medial Peri-Wound Care: Sween Lotion (Moisturizing lotion) 1 x Per Week/ Discharge Instructions: Apply moisturizing lotion to leg Prim Dressing: KerraCel Ag Gelling Fiber Dressing, 2x2 in (silver alginate) 1 x Per Week/ ary Discharge Instructions: Apply silver alginate to wound bed as instructed Secondary Dressing: Woven Gauze Sponge, Non-Sterile 4x4 in 1 x Per Week/ Discharge Instructions: Apply over primary dressing as directed. Com pression Wrap: ThreePress (3 layer compression wrap) 1 x Per Week/ Discharge Instructions: Apply three layer compression , use unna layer at top to secure wrap 1. Would recommend currently that we going continue with the wound care measures  as before and the patient is in agreement the plan this includes the use of a silver alginate dressing just to keep things dry and hopefully by the next week this will be completely healed. 2. We will also get a continue with the 3 layer compression wrap for 1 more week which I think has been beneficial and again hopefully after next week she will be able to transition into her compression socks. We will see patient back for reevaluation in 1 week here in the clinic. If anything worsens or changes patient will contact our office for additional recommendations. Electronic Signature(s) Signed: 05/24/2020 5:19:59 PM By: Lenda Kelp PA-C Entered By: Lenda Kelp on 05/24/2020 17:19:59 -------------------------------------------------------------------------------- SuperBill Details Patient Name: Date of Service: [name], [name] [name] 05/24/2020 Medical Record Number: [id number] Patient Account Number: [id number] Date of Birth/Sex: Treating RN: 13-Dec-1960 (60 y.o. [name] Primary Care Provider: Theodis Shove Other Clinician: Referring Provider: Treating Provider/Extender: [name] in Treatment: 14 Diagnosis Coding ICD-10 Codes Code Description I87.2 Venous insufficiency (chronic) (peripheral) L97.822 Non-pressure chronic ulcer of other part of left lower leg with fat layer exposed I10 Essential (primary) hypertension Facility Procedures CPT4 Code: [id number] Description: [phone] - DEBRIDE WOUND 1ST 20 SQ CM OR < ICD-10 Diagnosis Description L97.822 Non-pressure chronic ulcer of other part of left lower leg with fat layer expose Modifier: d Quantity: 1 Physician Procedures : CPT4 Code Description Modifier [id number] - WC PHYS DEBR WO ANESTH 20 SQ CM ICD-10 Diagnosis Description L97.822 Non-pressure chronic ulcer of other part of left lower leg with fat layer exposed Quantity: 1 Electronic Signature(s) Signed: 05/24/2020 5:20:09 PM By: Lenda Kelp PA-C Entered By: Lenda Kelp on 05/24/2020 17:20:09

## 2020-05-24 NOTE — Progress Notes (Signed)
FLORAINE, BUECHLER ([id number]) Visit Report for 05/24/2020 Arrival Information Details Patient Name: Date of Service: [place], [place] 05/24/2020 3:30 PM Medical Record Number: [id number] Patient Account Number: [id number] Date of Birth/Sex: Treating RN: Sep 28, 1960 (60 y.o. Helene Shoe, Meta.Reding Primary Care Emalyn Schou: Micheline Rough Other Clinician: Referring Anglia Blakley: Treating Aritzel Krusemark/Extender: Vaughan Basta Weeks in Treatment: 14 Visit Information History Since Last Visit Added or deleted any medications: No Patient Arrived: Ambulatory Any new allergies or adverse reactions: No Arrival Time: 15:48 Had a fall or experienced change in No Accompanied By: self activities of daily living that may affect Transfer Assistance: None risk of falls: Patient Identification Verified: Yes Signs or symptoms of abuse/neglect since last visito No Secondary Verification Process Completed: Yes Hospitalized since last visit: No Patient Requires Transmission-Based Precautions: No Implantable device outside of the clinic excluding No Patient Has Alerts: Yes cellular tissue based products placed in the center Patient Alerts: ABI noncompressible since last visit: Has Dressing in Place as Prescribed: Yes Has Compression in Place as Prescribed: Yes Pain Present Now: No Electronic Signature(s) Signed: 05/24/2020 5:36:42 PM By: Deon Pilling Entered By: Deon Pilling on 05/24/2020 15:51:43 -------------------------------------------------------------------------------- Compression Therapy Details Patient Name: Date of Service: [name] [place] 05/24/2020 3:30 PM Medical Record Number: [id number] Patient Account Number: [id number] Date of Birth/Sex: Treating RN: May 13, 1960 (60 y.o. [name] Primary Care Ziair Penson: Micheline Rough Other Clinician: Referring Zakira Ressel: Treating Shali Vesey/Extender: Vaughan Basta Weeks in Treatment: 14 Compression Therapy  Performed for Wound Assessment: Wound #1 Left,Medial Lower Leg Performed By: Clinician Rhae Hammock, RN Compression Type: Three Layer Post Procedure Diagnosis Same as Pre-procedure Electronic Signature(s) Signed: 05/24/2020 6:01:36 PM By: Baruch Gouty RN, BSN Entered By: Baruch Gouty on 05/24/2020 17:09:24 -------------------------------------------------------------------------------- Encounter Discharge Information Details Patient Name: Date of Service: [name], [place] 05/24/2020 3:30 PM Medical Record Number: [id number] Patient Account Number: [id number] Date of Birth/Sex: Treating RN: 1961-01-17 (60 y.o. [name] Primary Care Michaeleen Down: Micheline Rough Other Clinician: Referring Masiyah Jorstad: Treating Nathanial Arrighi/Extender: Vaughan Basta Weeks in Treatment: 14 Encounter Discharge Information Items Post Procedure Vitals Discharge Condition: Stable Temperature (F): 98 Ambulatory Status: Ambulatory Pulse (bpm): 96 Discharge Destination: Home Respiratory Rate (breaths/min): 20 Transportation: Private Auto Blood Pressure (mmHg): 138/80 Accompanied By: self Schedule Follow-up Appointment: Yes Clinical Summary of Care: Electronic Signature(s) Signed: 05/24/2020 5:36:42 PM By: Deon Pilling Entered By: Deon Pilling on 05/24/2020 17:36:04 -------------------------------------------------------------------------------- Lower Extremity Assessment Details Patient Name: Date of Service: [name] 05/24/2020 3:30 PM Medical Record Number: [id number] Patient Account Number: [id number] Date of Birth/Sex: Treating RN: 03-13-1961 (60 y.o. [name] Primary Care Ruhani Umland: Micheline Rough Other Clinician: Referring Aivy Akter: Treating Demesha Boorman/Extender: Vaughan Basta Weeks in Treatment: 14 Edema Assessment Assessed: [Left: Yes] [Right: No] Edema: [Left: Ye] [Right: s] Calf Left: Right: Point of Measurement: 35 cm  From Medial Instep 40 cm Ankle Left: Right: Point of Measurement: 9 cm From Medial Instep 22 cm Vascular Assessment Pulses: Dorsalis Pedis Palpable: [Left:Yes] Electronic Signature(s) Signed: 05/24/2020 5:36:42 PM By: Deon Pilling Entered By: Deon Pilling on 05/24/2020 15:53:07 -------------------------------------------------------------------------------- [place] Details Patient Name: Date of Service: [name], [place] 05/24/2020 3:30 PM Medical Record Number: [id number] Patient Account Number: [id number] Date of Birth/Sex: Treating RN: 06/04/1960 (60 y.o. [name] Primary Care Cuma Polyakov: Micheline Rough Other Clinician: Referring Lasonya Hubner: Treating Caylen Kuwahara/Extender: Vaughan Basta Weeks in Treatment: 14 Active Inactive Venous Leg Ulcer Nursing Diagnoses: Knowledge deficit related  to disease process and management Potential for venous Insuffiency (use before diagnosis confirmed) Goals: Patient will maintain optimal edema control Date Initiated: 02/16/2020 Target Resolution Date: 07/07/2020 Goal Status: Active Interventions: Assess peripheral edema status every visit. Compression as ordered Provide education on venous insufficiency Treatment Activities: Therapeutic compression applied : 02/16/2020 Notes: Wound/Skin Impairment Nursing Diagnoses: Impaired tissue integrity Knowledge deficit related to ulceration/compromised skin integrity Goals: Patient/caregiver will verbalize understanding of skin care regimen Date Initiated: 02/16/2020 Target Resolution Date: 06/09/2020 Goal Status: Active Ulcer/skin breakdown will have a volume reduction of 30% by week 4 Date Initiated: 02/16/2020 Date Inactivated: 03/22/2020 Target Resolution Date: 03/15/2020 Goal Status: Met Ulcer/skin breakdown will have a volume reduction of 50% by week 8 Date Initiated: 03/22/2020 Date Inactivated: 04/19/2020 Target Resolution Date:  04/19/2020 Goal Status: Met Ulcer/skin breakdown will have a volume reduction of 80% by week 12 Date Initiated: 04/19/2020 Date Inactivated: 05/17/2020 Target Resolution Date: 05/17/2020 Goal Status: Met Ulcer/skin breakdown will heal within 14 weeks Date Initiated: 05/17/2020 Target Resolution Date: 05/31/2020 Goal Status: Active Interventions: Assess patient/caregiver ability to obtain necessary supplies Assess patient/caregiver ability to perform ulcer/skin care regimen upon admission and as needed Assess ulceration(s) every visit Provide education on ulcer and skin care Treatment Activities: Skin care regimen initiated : 02/16/2020 Topical wound management initiated : 02/16/2020 Notes: Electronic Signature(s) Signed: 05/24/2020 6:01:36 PM By: Baruch Gouty RN, BSN Entered By: Baruch Gouty on 05/24/2020 17:08:36 -------------------------------------------------------------------------------- Pain Assessment Details Patient Name: Date of Service: [name], [place] 05/24/2020 3:30 PM Medical Record Number: [id number] Patient Account Number: [id number] Date of Birth/Sex: Treating RN: May 14, 1960 (60 y.o. [name] Primary Care Dewitte Vannice: Micheline Rough Other Clinician: Referring Mairely Foxworth: Treating Tyniya Kuyper/Extender: Vaughan Basta Weeks in Treatment: 14 Active Problems Location of Pain Severity and Description of Pain Patient Has Paino No Site Locations Rate the pain. Current Pain Level: 0 Pain Management and Medication Current Pain Management: Medication: No Cold Application: No Rest: No Massage: No Activity: No T.E.N.S.: No Heat Application: No Leg drop or elevation: No Is the Current Pain Management Adequate: Adequate How does your wound impact your activities of daily livingo Sleep: No Bathing: No Appetite: No Relationship With Others: No Bladder Continence: No Emotions: No Bowel Continence: No Work: No Toileting: No Drive:  No Dressing: No Hobbies: No Electronic Signature(s) Signed: 05/24/2020 5:36:42 PM By: Deon Pilling Entered By: Deon Pilling on 05/24/2020 15:52:13 -------------------------------------------------------------------------------- Patient/Caregiver Education Details Patient Name: Date of Service: [name], [place] 2/16/2022andnbsp3:30 PM Medical Record Number: [id number] Patient Account Number: [id number] Date of Birth/Gender: Treating RN: 1961-03-05 (60 y.o. [name] Primary Care Physician: Micheline Rough Other Clinician: Referring Physician: Treating Physician/Extender: Stevie Kern in Treatment: 14 Education Assessment Education Provided To: Patient Education Topics Provided Venous: Methods: Explain/Verbal Responses: Reinforcements needed, State content correctly Wound/Skin Impairment: Methods: Explain/Verbal Responses: Reinforcements needed, State content correctly Electronic Signature(s) Signed: 05/24/2020 6:01:36 PM By: Baruch Gouty RN, BSN Entered By: Baruch Gouty on 05/24/2020 17:09:05 -------------------------------------------------------------------------------- Wound Assessment Details Patient Name: Date of Service: [name], [place] 05/24/2020 3:30 PM Medical Record Number: [id number] Patient Account Number: [id number] Date of Birth/Sex: Treating RN: 06/05/1960 (60 y.o. [name] Primary Care Barbaraann Avans: Micheline Rough Other Clinician: Referring Lakelynn Severtson: Treating Alberto [name]/Extender: Vaughan Basta Weeks in Treatment: 14 Wound Status Wound Number: 1 Primary Venous Leg Ulcer Etiology: Wound Location: Left, Medial Lower Leg Wound Status: Open Wounding Event: Gradually Appeared Comorbid Anemia, Sleep Apnea, Hypertension, Peripheral Venous Date Acquired: 01/26/2020  History: Disease Weeks Of Treatment: 14 Clustered Wound: No Wound Measurements Length: (cm) 0.5 Width: (cm) 0.3 Depth:  (cm) 0.1 Area: (cm) 0.118 Volume: (cm) 0.012 % Reduction in Area: 96.5% % Reduction in Volume: 98.8% Epithelialization: Large (67-100%) Tunneling: No Undermining: No Wound Description Classification: Full Thickness Without Exposed Support Structures Wound Margin: Distinct, outline attached Exudate Amount: Small Exudate Type: Serosanguineous Exudate Color: red, brown Foul Odor After Cleansing: No Slough/Fibrino Yes Wound Bed Granulation Amount: Large (67-100%) Exposed Structure Granulation Quality: Pink Fascia Exposed: No Necrotic Amount: Small (1-33%) Fat Layer (Subcutaneous Tissue) Exposed: Yes Necrotic Quality: Adherent Slough Tendon Exposed: No Muscle Exposed: No Joint Exposed: No Bone Exposed: No Treatment Notes Wound #1 (Lower Leg) Wound Laterality: Left, Medial Cleanser Peri-Wound Care Sween Lotion (Moisturizing lotion) Discharge Instruction: Apply moisturizing lotion to leg Topical Primary Dressing KerraCel Ag Gelling Fiber Dressing, 2x2 in (silver alginate) Discharge Instruction: Apply silver alginate to wound bed as instructed Secondary Dressing Woven Gauze Sponge, Non-Sterile 4x4 in Discharge Instruction: Apply over primary dressing as directed. Secured With Compression Wrap ThreePress (3 layer compression wrap) Discharge Instruction: Apply three layer compression , use unna layer at top to secure wrap Compression Stockings Add-Ons Electronic Signature(s) Signed: 05/24/2020 5:36:42 PM By: Deon Pilling Entered By: Deon Pilling on 05/24/2020 15:57:15 -------------------------------------------------------------------------------- Vitals Details Patient Name: Date of Service: [name], [name] 05/24/2020 3:30 PM Medical Record Number: [id number] Patient Account Number: [id number] Date of Birth/Sex: Treating RN: 1960-10-09 (60 y.o. Helene Shoe, Meta.Reding Primary Care Jamisen Hawes: Micheline Rough Other Clinician: Referring Johnpaul Gillentine: Treating  Kalub Morillo/Extender: Vaughan Basta Weeks in Treatment: 14 Vital Signs Time Taken: 15:48 Temperature (F): 98 Height (in): 64 Pulse (bpm): 96 Weight (lbs): 278 Respiratory Rate (breaths/min): 20 Body Mass Index (BMI): 47.7 Blood Pressure (mmHg): 138/80 Reference Range: 80 - 120 mg / dl Electronic Signature(s) Signed: 05/24/2020 5:36:42 PM By: Deon Pilling Entered By: Deon Pilling on 05/24/2020 15:52:00

## 2020-05-31 ENCOUNTER — Encounter (HOSPITAL_BASED_OUTPATIENT_CLINIC_OR_DEPARTMENT_OTHER): Payer: BC Managed Care – PPO | Admitting: Physician Assistant

## 2020-05-31 ENCOUNTER — Other Ambulatory Visit: Payer: Self-pay

## 2020-05-31 DIAGNOSIS — L97822 Non-pressure chronic ulcer of other part of left lower leg with fat layer exposed: Secondary | ICD-10-CM | POA: Diagnosis not present

## 2020-05-31 NOTE — Progress Notes (Addendum)
[name], Tashauna ([id number]) Visit Report for 05/31/2020 Chief Complaint Document Details Patient Name: Date of Service: [name] TKINS, SURA DA 05/31/2020 3:45 PM Medical Record Number: [id number] Patient Account Number: [id number] Date of Birth/Sex: Treating RN: 11/14/1960 (60 y.o. [name]) Boehlein, [name] Primary Care Provider: Theodis ShoveKoberlein, Junell Other Clinician: Referring Provider: Treating Provider/Extender: Esaw DaceStone III, Fantasia Jinkins Koberlein, Junell Weeks in Treatment: 15 Information Obtained from: Patient Chief Complaint Left LE Ulcer Electronic Signature(s) Signed: 05/31/2020 4:13:44 PM By: Lenda KelpStone III, Rheba Diamond PA-C Entered By: Lenda KelpStone III, Umaima Scholten on 05/31/2020 16:13:44 -------------------------------------------------------------------------------- HPI Details Patient Name: Date of Service: [place] TK[place], [name] 05/31/2020 3:45 PM Medical Record Number: [id number] Patient Account Number: [id number] Date of Birth/Sex: Treating RN: 02/21/1961 (60 y.o. [name]) Boehlein, [name] Primary Care Provider: Theodis ShoveKoberlein, Junell Other Clinician: Referring Provider: Treating Provider/Extender: Esaw DaceStone III, Cypress Hinkson Koberlein, Junell Weeks in Treatment: 15 History of Present Illness HPI Description: 02/16/2020 upon evaluation today patient actually appears to be doing poorly in regard to her left medial lower extremity ulcer. This is actually an area that she tells me she has had intermittent issues with over the years although has been closed for some time she typically uses compression right now she has juxta lite compression wraps. With that being said she tells me that this nonetheless open several weeks/months ago and has been given her trouble since. She does have a history of chronic venous insufficiency she is seeing specialist for this in the past she has had an ablation as well as sclerotherapy. With that being said she also has hypertension chronically which is managed by her primary care provider. In general she seems to  be worsening overall with regard to the wound and states that she finally realized that she needed to come in and have somebody look at this and not continue to try to manage this on her own. No fevers, chills, nausea, vomiting, or diarrhea. 02/23/2020 on evaluation today patient appears to be doing well with regard to her wound. This is showing some signs of improvement which is great news still were not quite at the point where I would like to be as far as the overall appearance of the wound is concerned but I do believe this is better than last week. I do believe the Iodoflex is helping as well. 03/08/2020 upon evaluation today patient appears to be doing well with regard to her wound. She has been tolerating the dressing changes without complication. Fortunately I feel like she has made great progress with the Iodoflex but I feel like it may be the point rest to switch to something else possibly a collagen- based dressing at this time. 03/15/2020 upon evaluation today patient appears to be doing excellent in regard to her leg ulcer. She has been tolerating the dressing changes without complication. Fortunately there is no signs of active infection. Overall she is measuring a little bit smaller today which is great news. 03/22/2020 upon evaluation today patient appears to be doing well with regard to her wound. She has been tolerating the dressing changes without complication. Fortunately there is no signs of active infection at this time. 03/28/2020; patient I do not usually see however she has a wound on the left anterior lower leg secondary to chronic venous insufficiency we have been using silver collagen under compression. She arrives in clinic with a nonviable surface requiring debridement 04/12/2020 upon evaluation today patient appears to be doing well all things considered with regard to her leg ulcer. She is tolerating the dressing changes without complication there  is minimal dry skin  around the edges of the wound that may be trapping and stopping some of the events of the new skin I am can work on that today. Otherwise the surface of the wound appears to be doing excellent. 04/19/2020 upon evaluation today patient appears to be doing well with regard to her leg ulcer. She has been tolerating dressing changes without complication. Fortunately there is no signs of active infection at this time. No fever chills noted overall very pleased with how things seem to be progressing. 04/26/2020 on evaluation today patient appears to be doing well with regard to her wound currently. Is showing signs of excellent improvement overall is filling in nicely and there does not appear to be any signs of infection. No fevers, chills, nausea, vomiting, or diarrhea. 05/03/2020 upon evaluation today patient appears to be doing well with regard to her wound on the leg. This overall showing signs of good improvement which is great she has some good epithelial growth and overall I think that things are moving in the correct direction. We likewise going to continue with the wound care measures as before since she seems to making such good improvement. 2/3; venous wound on the left medial leg. This is contracting. We are using Prisma and 3 layer compression. She has a stocking and waiting in the eventuality this heals. She is already using it on the right 05/17/2020 upon evaluation today patient appears to be doing well with regard to her leg ulcer. She has been tolerating the dressing changes without complication. Fortunately there is no signs of active infection which is great news and overall very pleased with where things stand today. No fevers, chills, nausea, vomiting, or diarrhea. 05/24/2020 upon evaluation today patient appears to be doing well with regard to her wound. Overall I feel like she is making excellent progress. There does not appear to be any signs of active infection which is great  news. 05/31/2020 upon evaluation today patient appears to be doing well with regard to her wound. There does not appear to be any signs of active infection which is great news overall I am extremely pleased with where things stand today. Electronic Signature(s) Signed: 05/31/2020 4:17:50 PM By: Lenda Kelp PA-C Entered By: Lenda Kelp on 05/31/2020 16:17:50 -------------------------------------------------------------------------------- Physical Exam Details Patient Name: Date of Service: [name] DA 05/31/2020 3:45 PM Medical Record Number: [id number] Patient Account Number: [id number] Date of Birth/Sex: Treating RN: 1961/02/05 (60 y.o. [name] Primary Care Provider: Theodis Shove Other Clinician: Referring Provider: Treating Provider/Extender: [name] in Treatment: 15 Constitutional Well-nourished and well-hydrated in no acute distress. Respiratory normal breathing without difficulty. Psychiatric this patient is able to make decisions and demonstrates good insight into disease process. Alert and Oriented x 3. pleasant and cooperative. Notes Patient's wound showed signs of complete epithelization there does not appear to be any evidence of active infection which is great news and overall I am extremely pleased with where we stand today. Electronic Signature(s) Signed: 05/31/2020 4:18:17 PM By: Lenda Kelp PA-C Entered By: Lenda Kelp on 05/31/2020 16:18:17 -------------------------------------------------------------------------------- Physician Orders Details Patient Name: Date of Service: [place], [name] DA 05/31/2020 3:45 PM Medical Record Number: [id number] Patient Account Number: [id number] Date of Birth/Sex: Treating RN: 01/28/1961 (60 y.o. [name] Primary Care Provider: Theodis Shove Other Clinician: Referring Provider: Treating Provider/Extender: [name] in  Treatment: 15 Verbal / Phone Orders: No Diagnosis Coding  ICD-10 Coding Code Description I87.2 Venous insufficiency (chronic) (peripheral) L97.822 Non-pressure chronic ulcer of other part of left lower leg with fat layer exposed I10 Essential (primary) hypertension Discharge From [facility] Discharge from Wound Care Center Bathing/ Shower/ Hygiene May shower and wash wound with soap and water. Edema Control - Lymphedema / SCD / Other Bilateral Lower Extremities Elevate legs to the level of the heart or above for 30 minutes daily and/or when sitting, a frequency of: - throughout the day Avoid standing for long periods of time. Exercise regularly Moisturize legs daily. - both legs nightly Compression stocking or Garment 20-30 mm/Hg pressure to: - both legs daily. Apply first thing in the morning, remove at night. Electronic Signature(s) Signed: 05/31/2020 5:21:35 PM By: [name] RN, BSN Signed: 05/31/2020 6:06:11 PM By: Lenda Kelp PA-C Entered By: [name] on 05/31/2020 16:18:19 -------------------------------------------------------------------------------- Problem List Details Patient Name: Date of Service: [name], [name] DA 05/31/2020 3:45 PM Medical Record Number: [id number] Patient Account Number: [id number] Date of Birth/Sex: Treating RN: 03/25/1961 (61 y.o. [name] Primary Care Provider: Theodis Shove Other Clinician: Referring Provider: Treating Provider/Extender: [name] in Treatment: 15 Active Problems ICD-10 Encounter Code Description Active Date MDM Diagnosis I87.2 Venous insufficiency (chronic) (peripheral) 02/16/2020 No Yes L97.822 Non-pressure chronic ulcer of other part of left lower leg with fat layer 02/16/2020 No Yes exposed I10 Essential (primary) hypertension 02/16/2020 No Yes Inactive Problems Resolved Problems Electronic Signature(s) Signed: 05/31/2020 4:13:39 PM By: Lenda Kelp  PA-C Entered By: Lenda Kelp on 05/31/2020 16:13:38 -------------------------------------------------------------------------------- Progress Note Details Patient Name: Date of Service: [place], [name] DA 05/31/2020 3:45 PM Medical Record Number: [id number] Patient Account Number: [id number] Date of Birth/Sex: Treating RN: 18-Nov-1960 (60 y.o. [name] Primary Care Provider: Theodis Shove Other Clinician: Referring Provider: Treating Provider/Extender: [name] in Treatment: 15 Subjective Chief Complaint Information obtained from Patient Left LE Ulcer History of Present Illness (HPI) 02/16/2020 upon evaluation today patient actually appears to be doing poorly in regard to her left medial lower extremity ulcer. This is actually an area that she tells me she has had intermittent issues with over the years although has been closed for some time she typically uses compression right now she has juxta lite compression wraps. With that being said she tells me that this nonetheless open several weeks/months ago and has been given her trouble since. She does have a history of chronic venous insufficiency she is seeing specialist for this in the past she has had an ablation as well as sclerotherapy. With that being said she also has hypertension chronically which is managed by her primary care provider. In general she seems to be worsening overall with regard to the wound and states that she finally realized that she needed to come in and have somebody look at this and not continue to try to manage this on her own. No fevers, chills, nausea, vomiting, or diarrhea. 02/23/2020 on evaluation today patient appears to be doing well with regard to her wound. This is showing some signs of improvement which is great news still were not quite at the point where I would like to be as far as the overall appearance of the wound is concerned but I do believe this is  better than last week. I do believe the Iodoflex is helping as well. 03/08/2020 upon evaluation today patient appears to be doing well with regard to her wound. She has been tolerating  the dressing changes without complication. Fortunately I feel like she has made great progress with the Iodoflex but I feel like it may be the point rest to switch to something else possibly a collagen- based dressing at this time. 03/15/2020 upon evaluation today patient appears to be doing excellent in regard to her leg ulcer. She has been tolerating the dressing changes without complication. Fortunately there is no signs of active infection. Overall she is measuring a little bit smaller today which is great news. 03/22/2020 upon evaluation today patient appears to be doing well with regard to her wound. She has been tolerating the dressing changes without complication. Fortunately there is no signs of active infection at this time. 03/28/2020; patient I do not usually see however she has a wound on the left anterior lower leg secondary to chronic venous insufficiency we have been using silver collagen under compression. She arrives in clinic with a nonviable surface requiring debridement 04/12/2020 upon evaluation today patient appears to be doing well all things considered with regard to her leg ulcer. She is tolerating the dressing changes without complication there is minimal dry skin around the edges of the wound that may be trapping and stopping some of the events of the new skin I am can work on that today. Otherwise the surface of the wound appears to be doing excellent. 04/19/2020 upon evaluation today patient appears to be doing well with regard to her leg ulcer. She has been tolerating dressing changes without complication. Fortunately there is no signs of active infection at this time. No fever chills noted overall very pleased with how things seem to be progressing. 04/26/2020 on evaluation today patient  appears to be doing well with regard to her wound currently. Is showing signs of excellent improvement overall is filling in nicely and there does not appear to be any signs of infection. No fevers, chills, nausea, vomiting, or diarrhea. 05/03/2020 upon evaluation today patient appears to be doing well with regard to her wound on the leg. This overall showing signs of good improvement which is great she has some good epithelial growth and overall I think that things are moving in the correct direction. We likewise going to continue with the wound care measures as before since she seems to making such good improvement. 2/3; venous wound on the left medial leg. This is contracting. We are using Prisma and 3 layer compression. She has a stocking and waiting in the eventuality this heals. She is already using it on the right 05/17/2020 upon evaluation today patient appears to be doing well with regard to her leg ulcer. She has been tolerating the dressing changes without complication. Fortunately there is no signs of active infection which is great news and overall very pleased with where things stand today. No fevers, chills, nausea, vomiting, or diarrhea. 05/24/2020 upon evaluation today patient appears to be doing well with regard to her wound. Overall I feel like she is making excellent progress. There does not appear to be any signs of active infection which is great news. 05/31/2020 upon evaluation today patient appears to be doing well with regard to her wound. There does not appear to be any signs of active infection which is great news overall I am extremely pleased with where things stand today. Objective Constitutional Well-nourished and well-hydrated in no acute distress. Vitals Time Taken: 4:02 PM, Height: 64 in, Weight: 278 lbs, BMI: 47.7, Temperature: 97.9 F, Pulse: 91 bpm, Respiratory Rate: 18 breaths/min, Blood Pressure: 146/87 mmHg. Respiratory  normal breathing without  difficulty. Psychiatric this patient is able to make decisions and demonstrates good insight into disease process. Alert and Oriented x 3. pleasant and cooperative. General Notes: Patient's wound showed signs of complete epithelization there does not appear to be any evidence of active infection which is great news and overall I am extremely pleased with where we stand today. Integumentary (Hair, Skin) Wound #1 status is Open. Original cause of wound was Gradually Appeared. The date acquired was: 01/26/2020. The wound has been in treatment 15 weeks. The wound is located on the Left,Medial Lower Leg. The wound measures 0cm length x 0cm width x 0cm depth; 0cm^2 area and 0cm^3 volume. There is no tunneling or undermining noted. There is a none present amount of drainage noted. The wound margin is distinct with the outline attached to the wound base. There is no granulation within the wound bed. There is no necrotic tissue within the wound bed. Assessment Active Problems ICD-10 Venous insufficiency (chronic) (peripheral) Non-pressure chronic ulcer of other part of left lower leg with fat layer exposed Essential (primary) hypertension Plan Discharge From [facility] Services: Discharge from Wound Care Center Bathing/ Shower/ Hygiene: May shower and wash wound with soap and water. Edema Control - Lymphedema / SCD / Other: Elevate legs to the level of the heart or above for 30 minutes daily and/or when sitting, a frequency of: - throughout the day Avoid standing for long periods of time. Exercise regularly Moisturize legs daily. - both legs nightly Compression stocking or Garment 20-30 mm/Hg pressure to: - both legs daily. Apply first thing in the morning, remove at night. 1. Would recommend at this point that we have the patient go ahead and discontinue wound care services as she does appear to be completely healed. 2. I am going to recommend however she continue with compression I think that is of  utmost importance as far as keeping this completely closed she is in agreement with that plan. We will see back for follow-up visit as needed. Electronic Signature(s) Signed: 05/31/2020 4:18:30 PM By: Lenda Kelp PA-C Entered By: Lenda Kelp on 05/31/2020 16:18:30 -------------------------------------------------------------------------------- SuperBill Details Patient Name: Date of Service: [place], [name] DA 05/31/2020 Medical Record Number: [id number] Patient Account Number: [id number] Date of Birth/Sex: Treating RN: 1960/06/21 (60 y.o. [name] Primary Care Provider: Theodis Shove Other Clinician: Referring Provider: Treating Provider/Extender: [name] in Treatment: 15 Diagnosis Coding ICD-10 Codes Code Description I87.2 Venous insufficiency (chronic) (peripheral) L97.822 Non-pressure chronic ulcer of other part of left lower leg with fat layer exposed I10 Essential (primary) hypertension Facility Procedures CPT4 Code: [id number] Description: 99213 - WOUND CARE VISIT-LEV 3 EST PT Modifier: Quantity: 1 Physician Procedures : CPT4 Code Description Modifier [id number] 99213 - WC PHYS LEVEL 3 - EST PT ICD-10 Diagnosis Description I87.2 Venous insufficiency (chronic) (peripheral) L97.822 Non-pressure chronic ulcer of other part of left lower leg with fat layer exposed I10  Essential (primary) hypertension Quantity: 1 Electronic Signature(s) Signed: 05/31/2020 4:18:43 PM By: Lenda Kelp PA-C Entered By: Lenda Kelp on 05/31/2020 16:18:43

## 2020-06-05 NOTE — Progress Notes (Signed)
JAIDIN, UGARTE ([id number]) Visit Report for 05/31/2020 Arrival Information Details Patient Name: Date of Service: [name] DA 05/31/2020 3:45 PM Medical Record Number: [id number] Patient Account Number: [id number] Date of Birth/Sex: Treating RN: 11-May-1960 (60 y.o. Wynelle Link Primary Care Provider: Theodis Shove Other Clinician: Referring Provider: Treating Provider/Extender: [name] in Treatment: 15 Visit Information History Since Last Visit Added or deleted any medications: No Patient Arrived: Ambulatory Any new allergies or adverse reactions: No Arrival Time: 16:02 Had a fall or experienced change in No Accompanied By: alone activities of daily living that may affect Transfer Assistance: None risk of falls: Patient Identification Verified: Yes Signs or symptoms of abuse/neglect since last visito No Secondary Verification Process Completed: Yes Hospitalized since last visit: No Patient Requires Transmission-Based Precautions: No Implantable device outside of the clinic excluding No Patient Has Alerts: Yes cellular tissue based products placed in the center Patient Alerts: ABI noncompressible since last visit: Has Dressing in Place as Prescribed: Yes Has Compression in Place as Prescribed: Yes Pain Present Now: No Electronic Signature(s) Signed: 06/05/2020 5:56:44 PM By: Zandra Abts RN, BSN Entered By: Zandra Abts on 05/31/2020 16:02:38 -------------------------------------------------------------------------------- Clinic Level of Care Assessment Details Patient Name: Date of Service: [name] DA 05/31/2020 3:45 PM Medical Record Number: [id number] Patient Account Number: [id number] Date of Birth/Sex: Treating RN: [birth year] (60 y.o. [name] Primary Care Provider: Theodis Shove Other Clinician: Referring Provider: Treating Provider/Extender: [name] in Treatment:  15 Clinic Level of Care Assessment Items TOOL 4 Quantity Score []  - 0 Use when only an EandM is performed on FOLLOW-UP visit ASSESSMENTS - Nursing Assessment / Reassessment X- 1 10 Reassessment of Co-morbidities (includes updates in patient status) X- 1 5 Reassessment of Adherence to Treatment Plan ASSESSMENTS - Wound and Skin A ssessment / Reassessment X - Simple Wound Assessment / Reassessment - one wound 1 5 []  - 0 Complex Wound Assessment / Reassessment - multiple wounds []  - 0 Dermatologic / Skin Assessment (not related to wound area) ASSESSMENTS - Focused Assessment X- 1 5 Circumferential Edema Measurements - multi extremities []  - 0 Nutritional Assessment / Counseling / Intervention X- 1 5 Lower Extremity Assessment (monofilament, tuning fork, pulses) []  - 0 Peripheral Arterial Disease Assessment (using hand held doppler) ASSESSMENTS - Ostomy and/or Continence Assessment and Care []  - 0 Incontinence Assessment and Management []  - 0 Ostomy Care Assessment and Management (repouching, etc.) PROCESS - Coordination of Care X - Simple Patient / Family Education for ongoing care 1 15 []  - 0 Complex (extensive) Patient / Family Education for ongoing care X- 1 10 Staff obtains , Records, T Results / Process Orders est []  - 0 Staff telephones HHA, Nursing Homes / Clarify orders / etc []  - 0 Routine Transfer to another Facility (non-emergent condition) []  - 0 Routine Hospital Admission (non-emergent condition) []  - 0 New Admissions / / Ordering NPWT Apligraf, etc. , []  - 0 Emergency Hospital Admission (emergent condition) X- 1 10 Simple Discharge Coordination []  - 0 Complex (extensive) Discharge Coordination PROCESS - Special Needs []  - 0 Pediatric / Minor Patient Management []  - 0 Isolation Patient Management []  - 0 Hearing / Language / Visual special needs []  - 0 Assessment of Community assistance (transportation, D/C  planning, etc.) []  - 0 Additional assistance / Altered mentation []  - 0 Support Surface(s) Assessment (bed, cushion, seat, etc.) INTERVENTIONS - Wound Cleansing / Measurement X - Simple Wound Cleansing - one  wound 1 5 []  - 0 Complex Wound Cleansing - multiple wounds X- 1 5 Wound Imaging (photographs - any number of wounds) []  - 0 Wound Tracing (instead of photographs) []  - 0 Simple Wound Measurement - one wound []  - 0 Complex Wound Measurement - multiple wounds INTERVENTIONS - Wound Dressings X - Small Wound Dressing one or multiple wounds 1 10 []  - 0 Medium Wound Dressing one or multiple wounds []  - 0 Large Wound Dressing one or multiple wounds []  - 0 Application of Medications - topical []  - 0 Application of Medications - injection INTERVENTIONS - Miscellaneous []  - 0 External ear exam []  - 0 Specimen Collection (cultures, biopsies, blood, body fluids, etc.) []  - 0 Specimen(s) / Culture(s) sent or taken to Lab for analysis []  - 0 Patient Transfer (multiple staff / / Similar devices) []  - 0 Simple Staple / Suture removal (25 or less) []  - 0 Complex Staple / Suture removal (26 or more) []  - 0 Hypo / Hyperglycemic Management (close monitor of Blood Glucose) []  - 0 Ankle / Brachial Index (ABI) - do not check if billed separately X- 1 5 Vital Signs Has the patient been seen at the hospital within the last three years: Yes Total Score: 90 Level Of Care: New/Established - Level 3 Electronic Signature(s) Signed: 05/31/2020 5:21:35 PM By: RN, BSN Entered By: on 05/31/2020 16:15:42 -------------------------------------------------------------------------------- Encounter Discharge Information Details Patient Name: Date of Service: [place], DA 05/31/2020 3:45 PM Medical Record Number: Patient Account Number: Date of Birth/Sex: Treating RN: Oct 22, 1960 (60 y.o. Nurse, adult Primary Care Provider:  Other Clinician: Referring Provider: Treating Provider/Extender: Weeks in Treatment: 15 Encounter Discharge Information Items Discharge Condition: Stable Ambulatory Status: Ambulatory Discharge Destination: Home Transportation: Private Auto Accompanied By: self Schedule Follow-up Appointment: Yes Clinical Summary of Care: Patient Declined Electronic Signature(s) Signed: 05/31/2020 5:21:35 PM By: RN, BSN Entered By: 06/02/2020 on 05/31/2020 16:20:54 -------------------------------------------------------------------------------- Lower Extremity Assessment Details Patient Name: Date of Service: [place], 06/02/2020 DA 05/31/2020 3:45 PM Medical Record Number: [name] Patient Account Number: 06/02/2020 Date of Birth/Sex: Treating RN: [birth year] (60 y.o. 03/09/1961 Primary Care Provider: 77 Other Clinician: Referring Provider: Treating Provider/Extender: [name] Weeks in Treatment: 15 Edema Assessment Assessed: [Left: No] [Right: No] Edema: [Left: Ye] [Right: s] Calf Left: Right: Point of Measurement: 35 cm From Medial Instep 39.5 cm Ankle Left: Right: Point of Measurement: 9 cm From Medial Instep 22 cm Vascular Assessment Pulses: Dorsalis Pedis Palpable: [Left:Yes] Electronic Signature(s) Signed: 06/05/2020 5:56:44 PM By: Esaw Dace RN, BSN Entered By: 06/02/2020 on 05/31/2020 16:03:42 -------------------------------------------------------------------------------- Multi-Disciplinary Care Plan Details Patient Name: Date of Service: [name], 06/02/2020 DA 05/31/2020 3:45 PM Medical Record Number: [name] Patient Account Number: 06/02/2020 Date of Birth/Sex: Treating RN: 02-06-1961 (60 y.o. 03/09/1961 Primary Care Provider: 77 Other Clinician: Referring Provider: Treating Provider/Extender: Wynelle Link in Treatment: 15 Multidisciplinary Care Plan reviewed with physician Active Inactive Electronic Signature(s) Signed: 05/31/2020 5:21:35 PM By: Esaw Dace RN, BSN Entered By: 06/07/2020 on 05/31/2020 16:03:54 -------------------------------------------------------------------------------- Pain Assessment Details Patient Name: Date of Service: [place] DA 05/31/2020 3:45 PM Medical Record Number: [name] Patient Account Number: [name] Date of Birth/Sex: Treating RN: 09-02-1960 (60 y.o. [id number] Primary Care Provider: 03/09/1961 Other Clinician: Referring Provider: Treating Provider/Extender: 77, Junell [name]  in Treatment: 15 Active Problems Location of Pain Severity and Description of Pain Patient Has Paino No Site Locations Pain Management and Medication Current Pain Management: Electronic Signature(s) Signed: 06/05/2020 5:56:44 PM By: Zandra Abts RN, BSN Entered By: Zandra Abts on 05/31/2020 16:03:03 -------------------------------------------------------------------------------- Patient/Caregiver Education Details Patient Name: Date of Service: [name], [name] DA 2/23/2022andnbsp3:45 PM Medical Record Number: [id number] Patient Account Number: [id number] Date of Birth/Gender: Treating RN: 1961-02-20 (60 y.o. [name] Primary Care Physician: Theodis Shove Other Clinician: Referring Physician: Treating Physician/Extender: Eliseo Squires in Treatment: 15 Education Assessment Education Provided To: Patient Education Topics Provided Venous: Methods: Explain/Verbal Responses: Reinforcements needed, State content correctly Wound/Skin Impairment: Methods: Explain/Verbal Responses: Reinforcements needed, State content correctly Electronic Signature(s) Signed: 05/31/2020 5:21:35 PM By: [name] RN, BSN Entered By: [name] on 05/31/2020  16:04:15 -------------------------------------------------------------------------------- Wound Assessment Details Patient Name: Date of Service: [place], [name] DA 05/31/2020 3:45 PM Medical Record Number: [id number] Patient Account Number: [id number] Date of Birth/Sex: Treating RN: September 20, 1960 (60 y.o. Wynelle Link Primary Care Provider: Theodis Shove Other Clinician: Referring Provider: Treating Provider/Extender: [name] in Treatment: 15 Wound Status Wound Number: 1 Primary Venous Leg Ulcer Etiology: Wound Location: Left, Medial Lower Leg Wound Status: Open Wounding Event: Gradually Appeared Comorbid Anemia, Sleep Apnea, Hypertension, Peripheral Venous Date Acquired: 01/26/2020 History: Disease Weeks Of Treatment: 15 Clustered Wound: No Photos Photo Uploaded By: Benjaman Kindler on 06/02/2020 14:32:47 Wound Measurements Length: (cm) Width: (cm) Depth: (cm) Area: (cm) Volume: (cm) 0 % Reduction in Area: 100% 0 % Reduction in Volume: 100% 0 Epithelialization: Large (67-100%) 0 Tunneling: No 0 Undermining: No Wound Description Classification: Full Thickness Without Exposed Support Structures Wound Margin: Distinct, outline attached Exudate Amount: None Present Foul Odor After Cleansing: No Slough/Fibrino No Wound Bed Granulation Amount: None Present (0%) Exposed Structure Necrotic Amount: None Present (0%) Fascia Exposed: No Fat Layer (Subcutaneous Tissue) Exposed: No Tendon Exposed: No Muscle Exposed: No Joint Exposed: No Bone Exposed: No Electronic Signature(s) Signed: 06/05/2020 5:56:44 PM By: Zandra Abts RN, BSN Entered By: Zandra Abts on 05/31/2020 16:04:06 -------------------------------------------------------------------------------- Vitals Details Patient Name: Date of Service: [name], [name] DA 05/31/2020 3:45 PM Medical Record Number: [id number] Patient Account Number: [id number] Date of  Birth/Sex: Treating RN: 12/05/1960 (60 y.o. Wynelle Link Primary Care Provider: Theodis Shove Other Clinician: Referring Provider: Treating Provider/Extender: [name] in Treatment: 15 Vital Signs Time Taken: 16:02 Temperature (F): 97.9 Height (in): 64 Pulse (bpm): 91 Weight (lbs): 278 Respiratory Rate (breaths/min): 18 Body Mass Index (BMI): 47.7 Blood Pressure (mmHg): 146/87 Reference Range: 80 - 120 mg / dl Electronic Signature(s) Signed: 06/05/2020 5:56:44 PM By: Zandra Abts RN, BSN Entered By: Zandra Abts on 05/31/2020 16:02:58

## 2020-06-22 ENCOUNTER — Other Ambulatory Visit: Payer: Self-pay

## 2020-06-22 ENCOUNTER — Ambulatory Visit
Admission: RE | Admit: 2020-06-22 | Discharge: 2020-06-22 | Disposition: A | Discharge status: Home / Self Care | Payer: BC Managed Care – PPO | Source: Ambulatory Visit | Attending: Family Medicine | Admitting: Family Medicine

## 2020-06-22 DIAGNOSIS — Z1231 Encounter for screening mammogram for malignant neoplasm of breast: Secondary | ICD-10-CM

## 2020-07-03 ENCOUNTER — Other Ambulatory Visit: Payer: Self-pay

## 2020-07-03 ENCOUNTER — Encounter: Payer: Self-pay | Admitting: Pulmonary Disease

## 2020-07-03 ENCOUNTER — Ambulatory Visit: Payer: BC Managed Care – PPO | Admitting: Pulmonary Disease

## 2020-07-03 DIAGNOSIS — R0600 Dyspnea, unspecified: Secondary | ICD-10-CM

## 2020-07-03 DIAGNOSIS — Z9989 Dependence on other enabling machines and devices: Secondary | ICD-10-CM

## 2020-07-03 DIAGNOSIS — G4733 Obstructive sleep apnea (adult) (pediatric): Secondary | ICD-10-CM | POA: Diagnosis not present

## 2020-07-03 DIAGNOSIS — R0609 Other forms of dyspnea: Secondary | ICD-10-CM

## 2020-07-03 NOTE — Assessment & Plan Note (Signed)
Likely related to obesity and deconditioning

## 2020-07-03 NOTE — Patient Instructions (Signed)
  We will send an order to local DME to get you CPAP supplies including filter, hose, new AirFit F20 small full facemask  We will try to track down her sleep study from DME or from [facility] pulmonary associates

## 2020-07-03 NOTE — Assessment & Plan Note (Signed)
We will send an order to local DME to get you CPAP supplies including filter, hose, new AirFit F20 small full facemask  We will try to track down her sleep study from DME or from [facility] pulmonary associates CPAP is working well on current settings.  CPAP download was reviewed which shows excellent control of events on auto settings 7 to 12 cm with average pressure of 9 cm and large leak which is likely related to old mask and interface wearing off  Weight loss encouraged, compliance with goal of at least 4-6 hrs every night is the expectation. Advised against medications with sedative side effects Cautioned against driving when sleepy - understanding that sleepiness will vary on a day to day basis

## 2020-07-03 NOTE — Progress Notes (Signed)
   Subjective:    Patient ID: [name], female    DOB: [birth year], 60 y.o.   MRN: [id number]  HPI  60 year old for follow-up of obstructive sleep apnea. Maintained on auto CPAP 7 to 12 cm  She was diagnosed about 5 years ago with a sleep study done in [place] which showed AHI of 80/hour.  Initial office visit 01/2019 She is still using her old supplies and has not renewed this for a year and a half, she reports a leak from around her machine, wakes up with dryness of mouth, no headaches. Denies sleep pressure or somnolence in the afternoons. She is very compliant with her machine   Significant tests/ events reviewed  2015 NPSG [place], severe OSA, AHI 80/hour -by report   Review of Systems neg for any significant sore throat, dysphagia, itching, sneezing, nasal congestion or excess/ purulent secretions, fever, chills, sweats, unintended wt loss, pleuritic or exertional cp, hempoptysis, orthopnea pnd or change in chronic leg swelling. Also denies presyncope, palpitations, heartburn, abdominal pain, nausea, vomiting, diarrhea or change in bowel or urinary habits, dysuria,hematuria, rash, arthralgias, visual complaints, headache, numbness weakness or ataxia.     Objective:   Physical Exam  Gen. Pleasant, obese, in no distress ENT - no lesions, no post nasal drip Neck: No JVD, no thyromegaly, no carotid bruits Lungs: no use of accessory muscles, no dullness to percussion, decreased without rales or rhonchi  Cardiovascular: Rhythm regular, heart sounds  normal, no murmurs or gallops, 1+ peripheral edema Musculoskeletal: No deformities, no cyanosis or clubbing , no tremors       Assessment & Plan:

## 2020-08-23 ENCOUNTER — Other Ambulatory Visit: Payer: Self-pay | Admitting: Family Medicine

## 2020-08-23 DIAGNOSIS — E039 Hypothyroidism, unspecified: Secondary | ICD-10-CM

## 2020-11-25 ENCOUNTER — Other Ambulatory Visit: Payer: Self-pay | Admitting: Family Medicine

## 2020-11-25 DIAGNOSIS — E039 Hypothyroidism, unspecified: Secondary | ICD-10-CM

## 2021-02-19 ENCOUNTER — Other Ambulatory Visit: Payer: Self-pay | Admitting: Family Medicine

## 2021-02-19 DIAGNOSIS — E039 Hypothyroidism, unspecified: Secondary | ICD-10-CM

## 2021-02-19 NOTE — Telephone Encounter (Signed)
Overdue for visit

## 2021-02-20 NOTE — Telephone Encounter (Signed)
Patient informed of the message below and an appt was scheduled for 12/21.

## 2021-03-14 ENCOUNTER — Other Ambulatory Visit: Payer: Self-pay | Admitting: Family Medicine

## 2021-03-14 DIAGNOSIS — I10 Essential (primary) hypertension: Secondary | ICD-10-CM

## 2021-03-28 ENCOUNTER — Ambulatory Visit: Payer: BC Managed Care – PPO | Admitting: Family Medicine

## 2021-03-28 ENCOUNTER — Encounter: Payer: Self-pay | Admitting: Family Medicine

## 2021-03-28 VITALS — BP 130/82 | HR 83 | Temp 97.9°F | Ht 64.0 in | Wt 277.4 lb

## 2021-03-28 DIAGNOSIS — R600 Localized edema: Secondary | ICD-10-CM

## 2021-03-28 DIAGNOSIS — G4733 Obstructive sleep apnea (adult) (pediatric): Secondary | ICD-10-CM

## 2021-03-28 DIAGNOSIS — M25561 Pain in right knee: Secondary | ICD-10-CM | POA: Diagnosis not present

## 2021-03-28 DIAGNOSIS — Z131 Encounter for screening for diabetes mellitus: Secondary | ICD-10-CM

## 2021-03-28 DIAGNOSIS — J019 Acute sinusitis, unspecified: Secondary | ICD-10-CM

## 2021-03-28 DIAGNOSIS — E039 Hypothyroidism, unspecified: Secondary | ICD-10-CM | POA: Diagnosis not present

## 2021-03-28 DIAGNOSIS — E538 Deficiency of other specified B group vitamins: Secondary | ICD-10-CM | POA: Diagnosis not present

## 2021-03-28 DIAGNOSIS — M5441 Lumbago with sciatica, right side: Secondary | ICD-10-CM

## 2021-03-28 DIAGNOSIS — E2839 Other primary ovarian failure: Secondary | ICD-10-CM

## 2021-03-28 DIAGNOSIS — I10 Essential (primary) hypertension: Secondary | ICD-10-CM

## 2021-03-28 DIAGNOSIS — Z9989 Dependence on other enabling machines and devices: Secondary | ICD-10-CM

## 2021-03-28 DIAGNOSIS — R32 Unspecified urinary incontinence: Secondary | ICD-10-CM

## 2021-03-28 DIAGNOSIS — G8929 Other chronic pain: Secondary | ICD-10-CM

## 2021-03-28 DIAGNOSIS — Z13220 Encounter for screening for lipoid disorders: Secondary | ICD-10-CM

## 2021-03-28 DIAGNOSIS — Z23 Encounter for immunization: Secondary | ICD-10-CM

## 2021-03-28 LAB — COMPREHENSIVE METABOLIC PANEL
ALT: 19 U/L (ref 0–35)
AST: 31 U/L (ref 0–37)
Albumin: 4.3 g/dL (ref 3.5–5.2)
Alkaline Phosphatase: 80 U/L (ref 39–117)
BUN: 19 mg/dL (ref 6–23)
CO2: 30 mEq/L (ref 19–32)
Calcium: 10.3 mg/dL (ref 8.4–10.5)
Chloride: 102 mEq/L (ref 96–112)
Creatinine, Ser: 0.89 mg/dL (ref 0.40–1.20)
GFR: 69.58 mL/min (ref >60.00)
Glucose, Bld: 92 mg/dL (ref 70–99)
Potassium: 4.8 mEq/L (ref 3.5–5.1)
Sodium: 139 mEq/L (ref 135–145)
Total Bilirubin: 0.5 mg/dL (ref 0.2–1.2)
Total Protein: 8.1 g/dL (ref 6.0–8.3)

## 2021-03-28 LAB — CBC WITH DIFFERENTIAL/PLATELET
Basophils Absolute: 0 10*3/uL (ref 0.0–0.1)
Basophils Relative: 0.2 % (ref 0.0–3.0)
Eosinophils Absolute: 0.2 10*3/uL (ref 0.0–0.7)
Eosinophils Relative: 2.2 % (ref 0.0–5.0)
HCT: 36.2 % (ref 36.0–46.0)
Hemoglobin: 12.1 g/dL (ref 12.0–15.0)
Lymphocytes Relative: 28.4 % (ref 12.0–46.0)
Lymphs Abs: 2 10*3/uL (ref 0.7–4.0)
MCHC: 33.5 g/dL (ref 30.0–36.0)
MCV: 95.2 fl (ref 78.0–100.0)
Monocytes Absolute: 0.5 10*3/uL (ref 0.1–1.0)
Monocytes Relative: 6.6 % (ref 3.0–12.0)
Neutro Abs: 4.4 10*3/uL (ref 1.4–7.7)
Neutrophils Relative %: 62.6 % (ref 43.0–77.0)
Platelets: 190 10*3/uL (ref 150.0–400.0)
RBC: 3.8 Mil/uL — ABNORMAL LOW (ref 3.87–5.11)
RDW: 13.2 % (ref 11.5–15.5)
WBC: 7 10*3/uL (ref 4.0–10.5)

## 2021-03-28 LAB — LIPID PANEL
Cholesterol: 152 mg/dL (ref 0–200)
HDL: 42.5 mg/dL (ref >39.00)
LDL Cholesterol: 89 mg/dL (ref 0–99)
NonHDL: 109.4
Total CHOL/HDL Ratio: 4
Triglycerides: 104 mg/dL (ref 0.0–149.0)
VLDL: 20.8 mg/dL (ref 0.0–40.0)

## 2021-03-28 LAB — FOLATE: Folate: >23.4 ng/mL (ref >5.9)

## 2021-03-28 LAB — VITAMIN B12: Vitamin B-12: 855 pg/mL (ref 211–911)

## 2021-03-28 LAB — HEMOGLOBIN A1C: Hgb A1c MFr Bld: 5.7 % (ref 4.6–6.5)

## 2021-03-28 LAB — TSH: TSH: 1.01 u[IU]/mL (ref 0.35–5.50)

## 2021-03-28 MED ORDER — MIRABEGRON ER 25 MG PO TB24: 25.0000 mg | ORAL_TABLET | Freq: Every day | ORAL | 0 refills | Status: DC

## 2021-03-28 MED ORDER — FUROSEMIDE 20 MG PO TABS: 20.0000 mg | ORAL_TABLET | Freq: Every day | ORAL | 3 refills | Status: DC

## 2021-03-28 MED ORDER — DOXYCYCLINE HYCLATE 100 MG PO TABS: 100.0000 mg | ORAL_TABLET | Freq: Two times a day (BID) | ORAL | 0 refills | Status: AC

## 2021-03-28 NOTE — Progress Notes (Signed)
[name] DOB: 04-29-1960 Encounter date: 03/28/2021  This is a 60 y.o. female who presents with chronic condition visit.   History of present illness:  Having some incontinence: wearing liners to help with this. Trying to go more often to keep bladder emptied. Even just standing up in morning she will not be able to control.   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 Sinus pressure. Cough drops, cold medicine.   Knee is still issue- had cortisone shot which worked for [employer]. Working on losing weight. Brace for knee is big, bulky. Not wearing like she should. Using topical roll on medication and aleve which helps. Some days are worse than others.   Numbness right thumb - just at top- for 2 weeks. Noted after she was doing a lot of cutting, but hasn't gone away. Is a little better.   Having pain down right leg along with swelling. Tightness of anterior right leg. Tight, aches. Pain starts at back of hip and runs behind right leg. Bothers her at night. Not constant. Has had this in past and has had shot for this.   Noticed some dry mouth. Maybe from machine. She is compliant with CPAP.    Allergies  Allergen Reactions   Ivp Dye [Iodinated Diagnostic Agents]     Hives, hot   Sulfa Antibiotics     Hives, hot flashes   Current Meds  Medication Sig   acetaminophen (TYLENOL) 500 MG tablet Take 500 mg by mouth as needed.   Calcium Carb-Cholecalciferol (CALCIUM 600 + D) 600-200 MG-UNIT TABS Take 1 tablet by mouth daily.   diphenhydrAMINE (BENADRYL) 25 MG tablet Take 25 mg by mouth as needed.   diphenhydramine-acetaminophen (TYLENOL PM) 25-500 MG TABS tablet Take 1 tablet by mouth at bedtime as needed.   doxycycline (VIBRA-TABS) 100 MG tablet Take 1 tablet (100 mg total) by mouth 2 (two) times daily for 7 days.   ELDERBERRY PO Take by mouth.   famotidine (PEPCID) 20 MG tablet Take 20 mg by mouth daily as needed for heartburn  or indigestion.   Ferrous Sulfate Dried (FERROUS SULFATE IRON) 200 (65 Fe) MG TABS Take 1 tablet by mouth daily.   furosemide (LASIX) 20 MG tablet Take 1 tablet (20 mg total) by mouth daily.   hydrochlorothiazide (HYDRODIURIL) 50 MG tablet Take 1 tablet by mouth once daily   levothyroxine (SYNTHROID) 175 MCG tablet TAKE 1 TABLET BY MOUTH ONCE DAILY BEFORE BREAKFAST . APPOINTMENT REQUIRED FOR FUTURE REFILLS   losartan (COZAAR) 100 MG tablet TAKE 1 TABLET BY MOUTH ONCE DAILY . APPOINTMENT REQUIRED FOR FUTURE REFILLS   mirabegron ER (MYRBETRIQ) 25 MG TB24 tablet Take 1 tablet (25 mg total) by mouth daily.   Multiple Vitamins-Minerals (ONE-A-DAY MENOPAUSE FORMULA) TABS Take 1 tablet by mouth daily.   naproxen sodium (ALEVE) 220 MG tablet Take 1 tablet (220 mg total) by mouth 2 (two) times daily as needed. (Patient taking differently: Take 440 mg by mouth daily as needed.)   OVER THE COUNTER MEDICATION OTC antacid as needed (cannot recall name)   vitamin B-12 (CYANOCOBALAMIN) 500 MCG tablet Take 500 mcg by mouth daily.    Review of Systems  Constitutional:  Negative for chills, fatigue and fever.  HENT:  Positive for postnasal drip and rhinorrhea. Negative for sinus pressure, sore throat and trouble swallowing.   Respiratory:  Negative for cough, chest tightness, shortness of breath and wheezing.   Cardiovascular:  Positive for leg swelling. Negative for chest pain and palpitations.  Genitourinary:  Positive for urgency. Negative for difficulty urinating, dysuria, flank pain and frequency.  Musculoskeletal:  Positive for arthralgias, back pain, gait problem and joint swelling.   Objective:  BP 130/82 (BP Location: Right Arm, Patient Position: Sitting, Cuff Size: Large)    Pulse 83    Temp 97.9 F (36.6 C) (Oral)    Ht 5\' 4"  (1.626 m)    Wt 277 lb 6.4 oz (125.8 kg)    SpO2 98%    BMI 47.62 kg/m   Weight: 277 lb 6.4 oz (125.8 kg)   BP Readings from Last 3 Encounters:  03/28/21 130/82  07/03/20  134/84  05/03/20 120/72   Wt Readings from Last 3 Encounters:  03/28/21 277 lb 6.4 oz (125.8 kg)  07/03/20 272 lb (123.4 kg)  05/03/20 274 lb (124.3 kg)    Physical Exam Constitutional:      General: She is not in acute distress.    Appearance: She is well-developed.  HENT:     Mouth/Throat:     Mouth: Mucous membranes are moist.     Pharynx: Oropharynx is clear. Posterior oropharyngeal erythema present. No oropharyngeal exudate.  Cardiovascular:     Rate and Rhythm: Normal rate and regular rhythm.     Heart sounds: Normal heart sounds. No murmur heard.   No friction rub.  Pulmonary:     Effort: Pulmonary effort is normal. No respiratory distress.     Breath sounds: Normal breath sounds. No wheezing or rales.  Musculoskeletal:     Right lower leg: 1+ Pitting Edema present.     Left lower leg: 1+ Pitting Edema present.     Comments: Bony enlargement right knee;genu varum  She has tenderness right SI area. No pain with extension, some tightness RL back with twisting  Neurological:     Mental Status: She is alert and oriented to person, place, and time.     Deep Tendon Reflexes:     Reflex Scores:      Patellar reflexes are 2+ on the right side and 2+ on the left side.      Achilles reflexes are 2+ on the right side and 2+ on the left side.    Comments: Negative straight leg raise  Psychiatric:        Behavior: Behavior normal.    Assessment/Plan  1. Hypertension, unspecified type Bp well controlled. Continue with losartan 100mg , hctz 50mg  - CBC with Differential/Platelet; Future - Comprehensive metabolic panel; Future - CBC with Differential/Platelet - Comprehensive metabolic panel  2. OSA on CPAP Compliant with cpap; continue with this.  3. Chronic pain of right knee Causes daily pain, right now controlling with topical tx and working on weight  loss.  4. Hypothyroidism, unspecified type Continue with synthroid 05/05/20; recheck labs today. - TSH; Future -  TSH  5. B12 deficiency Will recheck levels; further supplement pending results. - Vitamin B12; Future - Folate; Future - Vitamin B12 - Folate  6. Urinary incontinence, unspecified type Sample of myrbetriq 25mg  daily given today to trial; she will update me on this when we check in with lab results.  7. Low back pain with right-sided sciatica, unspecified back pain laterality, unspecified chronicity She would like to work on stretching/non surgical options for treatment.  - Ambulatory referral to Sports Medicine  8. Acute sinusitis, recurrence not specified, unspecified location - doxycycline (VIBRA-TABS) 100 MG tablet; Take 1 tablet (100 mg total) by mouth 2 (  two) times daily for 7 days.  Dispense: 14 tablet; Refill: 0  9. Lower extremity edema Will check in once we get bloodwork to determine next step.she does have increase diuresis with lasix, but hesitant to change dosing due to incontinence she already has.  - furosemide (LASIX) 20 MG tablet; Take 1 tablet (20 mg total) by mouth daily.  Dispense: 30 tablet; Refill: 3  10. Lipid screening - Lipid panel; Future - Lipid panel  11. Screening for diabetes mellitus - Hemoglobin A1c; Future - Hemoglobin A1c  12. Estrogen deficiency - DG Bone Density; Future  13. Need for immunization against influenza - Flu Vaccine QUAD 6+ mos PF IM (Fluarix Quad PF)   Return for pending blood results. 42 minute spent in chart review, charting, exam, follow up treatment plan.    Theodis Shove, MD

## 2021-04-03 NOTE — Progress Notes (Signed)
[name] D.[facility] [address] [place] [address] Phone: [phone]   Assessment and Plan:     1. Chronic bilateral low back pain with right-sided sciatica 2. Right hip pain 3. Primary osteoarthritis of right knee -Chronic exacerbation, initial sports medicine visit -Patient's multiple musculoskeletal complaints are likely from compensation with underlying right knee osteoarthritis - Start meloxicam 15 mg daily for 3 weeks and may use remainder as needed for pain control - Start Tylenol 500 mg 1 to 2 tablets 2-3 times a day for chronic pain relief - Start HEP for piriformis and sciatica symptoms  Pertinent previous records reviewed include knee x-ray 10/01/2019   Follow Up: 3 to 4 weeks for reevaluation.  Would consider CSI to right knee as this was significantly beneficial for patient in the past and I feel that right knee is patient's primary pain generator, leading to other musculoskeletal complaints   Subjective:   I, [name], am serving as a Neurosurgeon for Doctor [employer]  Chief Complaint: low back pain and right side sciatica   HPI:   04/04/2021 Patient is a 60 year old female complaining of low back pain and right side sciatica (years) , is seeing ortho for R knee pain has brace . Patient states having pain down right leg along with swelling. Tightness of anterior right leg. Tight, aches. Pain starts at back of hip and runs behind right leg. Bothers her at night. Not constant. Has had this in past and has had shot for this. Has been taking aleve, using heating pad. Trying to find comfort and relieve until she can have knee surgery on right knee    Relevant Historical Information: Right knee osteoarthritis  Additional pertinent review of systems negative.   Current Outpatient Medications:    acetaminophen (TYLENOL) 500 MG tablet, Take 500 mg by mouth as needed., Disp: , Rfl:    Calcium Carb-Cholecalciferol  (CALCIUM 600 + D) 600-200 MG-UNIT TABS, Take 1 tablet by mouth daily., Disp: 30 tablet, Rfl: 0   diphenhydrAMINE (BENADRYL) 25 MG tablet, Take 25 mg by mouth as needed., Disp: , Rfl:    diphenhydramine-acetaminophen (TYLENOL PM) 25-500 MG TABS tablet, Take 1 tablet by mouth at bedtime as needed., Disp: , Rfl:    doxycycline (VIBRA-TABS) 100 MG tablet, Take 1 tablet (100 mg total) by mouth 2 (two) times daily for 7 days., Disp: 14 tablet, Rfl: 0   ELDERBERRY PO, Take by mouth., Disp: , Rfl:    famotidine (PEPCID) 20 MG tablet, Take 20 mg by mouth daily as needed for heartburn or indigestion., Disp: , Rfl:    Ferrous Sulfate Dried (FERROUS SULFATE IRON) 200 (65 Fe) MG TABS, Take 1 tablet by mouth daily., Disp: 30 tablet, Rfl:    furosemide (LASIX) 20 MG tablet, Take 1 tablet (20 mg total) by mouth daily., Disp: 30 tablet, Rfl: 3   hydrochlorothiazide (HYDRODIURIL) 50 MG tablet, Take 1 tablet by mouth once daily, Disp: 90 tablet, Rfl: 0   levothyroxine (SYNTHROID) 175 MCG tablet, TAKE 1 TABLET BY MOUTH ONCE DAILY BEFORE BREAKFAST . APPOINTMENT REQUIRED FOR FUTURE REFILLS, Disp: 90 tablet, Rfl: 0   losartan (COZAAR) 100 MG tablet, TAKE 1 TABLET BY MOUTH ONCE DAILY . APPOINTMENT REQUIRED FOR FUTURE REFILLS, Disp: 90 tablet, Rfl: 0   meloxicam (MOBIC) 15 MG tablet, Take 1 tablet (15 mg total) by mouth daily., Disp: 30 tablet, Rfl: 0   mirabegron ER (MYRBETRIQ) 25 MG TB24 tablet, Take 1  tablet (25 mg total) by mouth daily., Disp: 14 tablet, Rfl: 0   Multiple Vitamins-Minerals (ONE-A-DAY MENOPAUSE FORMULA) TABS, Take 1 tablet by mouth daily., Disp: , Rfl:    naproxen sodium (ALEVE) 220 MG tablet, Take 1 tablet (220 mg total) by mouth 2 (two) times daily as needed. (Patient taking differently: Take 440 mg by mouth daily as needed.), Disp: , Rfl:    OVER THE COUNTER MEDICATION, OTC antacid as needed (cannot recall name), Disp: , Rfl:    vitamin B-12 (CYANOCOBALAMIN) 500 MCG tablet, Take 500 mcg by mouth  daily., Disp: , Rfl:    Objective:     Vitals:   04/04/21 1104  BP: 130/80  Pulse: 89  SpO2: 98%  Weight: 274 lb (124.3 kg)  Height: 5\' 4"  (1.626 m)      Body mass index is 47.03 kg/m.    Physical Exam:    General: awake, alert, and oriented no acute distress, nontoxic Skin: no suspicious lesions or rashes Neuro:sensation intact distally with no dificits, normal muscle tone, no atrophy, strength 5/5 in all tested lower ext groups Psych: normal mood and affect, speech clear  Right hip: No deformity, swelling or wasting ROM Fexion 80, ext 15, IR 35, ER 35  TTP over the hip flexors, greater troch, glute musculature, si joint, lumbar spine Negative log roll with FROM Negative FABER Negative FADIR Positive piriformis test Positive trendelenberg Gait slowed, with patient limping and favoring left leg   Electronically signed by:  D.[name] 12:12 PM 04/04/21

## 2021-04-04 ENCOUNTER — Ambulatory Visit: Payer: BC Managed Care – PPO | Admitting: Sports Medicine

## 2021-04-04 ENCOUNTER — Telehealth: Payer: Self-pay | Admitting: Family Medicine

## 2021-04-04 ENCOUNTER — Ambulatory Visit (INDEPENDENT_AMBULATORY_CARE_PROVIDER_SITE_OTHER): Payer: BC Managed Care – PPO

## 2021-04-04 ENCOUNTER — Other Ambulatory Visit: Payer: Self-pay

## 2021-04-04 VITALS — BP 130/80 | HR 89 | Ht 64.0 in | Wt 274.0 lb

## 2021-04-04 DIAGNOSIS — M25551 Pain in right hip: Secondary | ICD-10-CM

## 2021-04-04 DIAGNOSIS — M5441 Lumbago with sciatica, right side: Secondary | ICD-10-CM

## 2021-04-04 DIAGNOSIS — M1711 Unilateral primary osteoarthritis, right knee: Secondary | ICD-10-CM | POA: Diagnosis not present

## 2021-04-04 DIAGNOSIS — G8929 Other chronic pain: Secondary | ICD-10-CM

## 2021-04-04 MED ORDER — MELOXICAM 15 MG PO TABS: 15.0000 mg | ORAL_TABLET | Freq: Every day | ORAL | 0 refills | Status: DC

## 2021-04-04 NOTE — Telephone Encounter (Signed)
Patient stated that she is returning JoAnne calll regarding her lab results.  Patient could be contacted at [phone].  Please advise.

## 2021-04-04 NOTE — Telephone Encounter (Signed)
See results note. 

## 2021-04-04 NOTE — Patient Instructions (Addendum)
Good to see you  Meloxicam 15mg  daily for 3 weeks Additional tylenol 500mg  1-2 tablets 2-3 times a day   3-4 week follow up

## 2021-04-24 NOTE — Progress Notes (Deleted)
[name] D.[place] Phone: [phone]   Assessment and Plan:     There are no diagnoses linked to this encounter.  ***   Pertinent previous records reviewed include ***   Follow Up: ***     Subjective:   I, [name], am serving as a Education administrator for Doctor [employer]  Chief Complaint: low back pain and right side sciatica  HPI:  04/04/2021 Patient is a 61 year old female complaining of low back pain and right side sciatica (years) , is seeing ortho for R knee pain has brace . Patient states having pain down right leg along with swelling. Tightness of anterior right leg. Tight, aches. Pain starts at back of hip and runs behind right leg. Bothers her at night. Not constant. Has had this in past and has had shot for this. Has been taking aleve, using heating pad. Trying to find comfort and relieve until she can have knee surgery on right knee     04/25/2021 Patient states    Relevant Historical Information: Right knee osteoarthritis  Additional pertinent review of systems negative.   Current Outpatient Medications:    acetaminophen (TYLENOL) 500 MG tablet, Take 500 mg by mouth as needed., Disp: , Rfl:    Calcium Carb-Cholecalciferol (CALCIUM 600 + D) 600-200 MG-UNIT TABS, Take 1 tablet by mouth daily., Disp: 30 tablet, Rfl: 0   diphenhydrAMINE (BENADRYL) 25 MG tablet, Take 25 mg by mouth as needed., Disp: , Rfl:    diphenhydramine-acetaminophen (TYLENOL PM) 25-500 MG TABS tablet, Take 1 tablet by mouth at bedtime as needed., Disp: , Rfl:    ELDERBERRY PO, Take by mouth., Disp: , Rfl:    famotidine (PEPCID) 20 MG tablet, Take 20 mg by mouth daily as needed for heartburn or indigestion., Disp: , Rfl:    Ferrous Sulfate Dried (FERROUS SULFATE IRON) 200 (65 Fe) MG TABS, Take 1 tablet by mouth daily., Disp: 30 tablet, Rfl:    furosemide (LASIX) 20 MG tablet, Take 1 tablet (20 mg total) by mouth  daily., Disp: 30 tablet, Rfl: 3   hydrochlorothiazide (HYDRODIURIL) 50 MG tablet, Take 1 tablet by mouth once daily, Disp: 90 tablet, Rfl: 0   levothyroxine (SYNTHROID) 175 MCG tablet, TAKE 1 TABLET BY MOUTH ONCE DAILY BEFORE BREAKFAST . APPOINTMENT REQUIRED FOR FUTURE REFILLS, Disp: 90 tablet, Rfl: 0   losartan (COZAAR) 100 MG tablet, TAKE 1 TABLET BY MOUTH ONCE DAILY . APPOINTMENT REQUIRED FOR FUTURE REFILLS, Disp: 90 tablet, Rfl: 0   meloxicam (MOBIC) 15 MG tablet, Take 1 tablet (15 mg total) by mouth daily., Disp: 30 tablet, Rfl: 0   mirabegron ER (MYRBETRIQ) 25 MG TB24 tablet, Take 1 tablet (25 mg total) by mouth daily., Disp: 14 tablet, Rfl: 0   Multiple Vitamins-Minerals (ONE-A-DAY MENOPAUSE FORMULA) TABS, Take 1 tablet by mouth daily., Disp: , Rfl:    naproxen sodium (ALEVE) 220 MG tablet, Take 1 tablet (220 mg total) by mouth 2 (two) times daily as needed. (Patient taking differently: Take 440 mg by mouth daily as needed.), Disp: , Rfl:    OVER THE COUNTER MEDICATION, OTC antacid as needed (cannot recall name), Disp: , Rfl:    vitamin B-12 (CYANOCOBALAMIN) 500 MCG tablet, Take 500 mcg by mouth daily., Disp: , Rfl:    Objective:     There were no vitals filed for this visit.    There is no height or weight on file to calculate  BMI.    Physical Exam:    ***   Electronically signed by:  [name] D.[facility] 3:10 PM 04/24/21

## 2021-04-25 ENCOUNTER — Ambulatory Visit: Payer: BC Managed Care – PPO | Admitting: Sports Medicine

## 2021-05-03 ENCOUNTER — Other Ambulatory Visit: Payer: Self-pay | Admitting: Family Medicine

## 2021-05-03 DIAGNOSIS — Z1231 Encounter for screening mammogram for malignant neoplasm of breast: Secondary | ICD-10-CM

## 2021-05-25 ENCOUNTER — Other Ambulatory Visit: Payer: Self-pay | Admitting: Family Medicine

## 2021-05-25 DIAGNOSIS — E039 Hypothyroidism, unspecified: Secondary | ICD-10-CM

## 2021-06-25 ENCOUNTER — Other Ambulatory Visit: Payer: Self-pay

## 2021-06-25 ENCOUNTER — Ambulatory Visit
Admission: RE | Admit: 2021-06-25 | Discharge: 2021-06-25 | Disposition: A | Discharge status: Home / Self Care | Payer: BC Managed Care – PPO | Source: Ambulatory Visit | Attending: Family Medicine | Admitting: Family Medicine

## 2021-06-25 ENCOUNTER — Other Ambulatory Visit: Payer: Self-pay | Admitting: Family Medicine

## 2021-06-25 DIAGNOSIS — Z1231 Encounter for screening mammogram for malignant neoplasm of breast: Secondary | ICD-10-CM

## 2021-06-25 DIAGNOSIS — I10 Essential (primary) hypertension: Secondary | ICD-10-CM

## 2021-08-06 ENCOUNTER — Other Ambulatory Visit: Payer: Self-pay | Admitting: *Deleted

## 2021-08-06 DIAGNOSIS — R600 Localized edema: Secondary | ICD-10-CM

## 2021-08-06 MED ORDER — FUROSEMIDE 20 MG PO TABS: 20.0000 mg | ORAL_TABLET | Freq: Every day | ORAL | 3 refills | Status: DC

## 2021-08-06 NOTE — Telephone Encounter (Signed)
Rx done. 

## 2021-08-31 ENCOUNTER — Other Ambulatory Visit: Payer: Self-pay | Admitting: Family Medicine

## 2021-08-31 DIAGNOSIS — E039 Hypothyroidism, unspecified: Secondary | ICD-10-CM

## 2021-08-31 DIAGNOSIS — R600 Localized edema: Secondary | ICD-10-CM

## 2021-08-31 DIAGNOSIS — I10 Essential (primary) hypertension: Secondary | ICD-10-CM

## 2021-08-31 MED ORDER — LEVOTHYROXINE SODIUM 175 MCG PO TABS: ORAL_TABLET | ORAL | 1 refills | Status: DC

## 2021-08-31 MED ORDER — FUROSEMIDE 20 MG PO TABS: 20.0000 mg | ORAL_TABLET | Freq: Every day | ORAL | 1 refills | Status: DC

## 2021-08-31 MED ORDER — LOSARTAN POTASSIUM 100 MG PO TABS: ORAL_TABLET | ORAL | 1 refills | Status: DC

## 2021-08-31 MED ORDER — HYDROCHLOROTHIAZIDE 50 MG PO TABS: 50.0000 mg | ORAL_TABLET | Freq: Every day | ORAL | 1 refills | Status: DC

## 2021-09-18 ENCOUNTER — Ambulatory Visit
Admission: RE | Admit: 2021-09-18 | Discharge: 2021-09-18 | Disposition: A | Discharge status: Home / Self Care | Payer: BC Managed Care – PPO | Source: Ambulatory Visit | Attending: Family Medicine | Admitting: Family Medicine

## 2021-09-18 DIAGNOSIS — E2839 Other primary ovarian failure: Secondary | ICD-10-CM

## 2022-01-23 ENCOUNTER — Encounter (HOSPITAL_BASED_OUTPATIENT_CLINIC_OR_DEPARTMENT_OTHER): Payer: BC Managed Care – PPO | Attending: General Surgery | Admitting: General Surgery

## 2022-01-23 DIAGNOSIS — L97212 Non-pressure chronic ulcer of right calf with fat layer exposed: Secondary | ICD-10-CM | POA: Insufficient documentation

## 2022-01-23 DIAGNOSIS — R600 Localized edema: Secondary | ICD-10-CM | POA: Diagnosis not present

## 2022-01-23 DIAGNOSIS — I872 Venous insufficiency (chronic) (peripheral): Secondary | ICD-10-CM | POA: Insufficient documentation

## 2022-01-23 DIAGNOSIS — Z9049 Acquired absence of other specified parts of digestive tract: Secondary | ICD-10-CM | POA: Diagnosis not present

## 2022-01-23 DIAGNOSIS — I10 Essential (primary) hypertension: Secondary | ICD-10-CM | POA: Diagnosis not present

## 2022-01-23 NOTE — Progress Notes (Signed)
TREASE, BREMNER ([id number]) [id number] Page 1 of 4 Visit Report for 01/23/2022 Abuse Risk Screen Details Patient Name: Date of Service: [name], [name] 01/23/2022 1:30 PM Medical Record Number: [id number] Patient Account Number: [id number] Date of Birth/Sex: Treating RN: January 28, 1961 (61 y.o. [name] Primary Care Corrissa Martello: Micheline Rough Other Clinician: Referring [name]: Treating [name]/Extender: [name] Weeks in Treatment: 0 Abuse Risk Screen Items Answer ABUSE RISK SCREEN: Has anyone close to you tried to hurt or harm you recentlyo No Do you feel uncomfortable with anyone in your familyo No Has anyone forced you do things that you didnt want to doo No Electronic Signature(s) Signed: 01/23/2022 4:52:20 PM By: Adline Peals Entered By: Adline Peals on 01/23/2022 13:42:11 -------------------------------------------------------------------------------- Activities of Daily Living Details Patient Name: Date of Service: [name] 01/23/2022 1:30 PM Medical Record Number: [id number] Patient Account Number: [id number] Date of Birth/Sex: Treating RN: [birth year] (61 y.o. [name] Primary Care [name]: Micheline Rough Other Clinician: Referring [name]: Treating Jakaylah Schlafer/Extender: [name] Weeks in Treatment: 0 Activities of Daily Living Items Answer Activities of Daily Living (Please select one for each item) Drive Automobile Completely Able T Medications ake Completely Able Use T elephone Completely Able Care for Appearance Completely Able Use T oilet Completely Able Bath / Shower Completely Able Dress Self Completely Able Feed Self Completely Able Walk Completely Able Get In / Out Bed Completely Able Housework Completely Able Prepare Meals Completely Able Handle Money Completely Able Shop for Self Completely Able Electronic  Signature(s) Signed: 01/23/2022 4:52:20 PM By: Adline Peals Entered By: Adline Peals on 01/23/2022 13:42:35 [name] ([id number]) [id number] Page 2 of 4 -------------------------------------------------------------------------------- Education Screening Details Patient Name: Date of Service: [name] [place], [name] 01/23/2022 1:30 PM Medical Record Number: [id number] Patient Account Number: [id number] Date of Birth/Sex: Treating RN: 1960/10/16 (61 y.o. [name] Primary Care [name]: Micheline Rough Other Clinician: Referring [name]: Treating [name]/Extender: [name] in Treatment: 0 Primary Learner Assessed: Patient Learning Preferences/Education Level/Primary Language Learning Preference: Explanation, Demonstration, Video, Printed Material Highest Education Level: College or Above Preferred Language: English Cognitive Barrier Language Barrier: No Translator Needed: No Memory Deficit: No Emotional Barrier: No Cultural/Religious Beliefs Affecting Medical Care: No Physical Barrier Impaired Vision: Yes Glasses Impaired Hearing: No Decreased Hand dexterity: No Knowledge/Comprehension Knowledge Level: Medium Comprehension Level: Medium Ability to understand written instructions: Medium Ability to understand verbal instructions: Medium Motivation Anxiety Level: Calm Cooperation: Cooperative Education Importance: Acknowledges Need Interest in Health Problems: Asks Questions Perception: Coherent Willingness to Engage in Self-Management Medium Activities: Readiness to Engage in Self-Management Medium Activities: Electronic Signature(s) Signed: 01/23/2022 4:52:20 PM By: Adline Peals Entered By: Adline Peals on 01/23/2022 13:43:19 -------------------------------------------------------------------------------- Fall Risk Assessment Details Patient Name: Date of  Service: [name] [name] [name] 01/23/2022 1:30 PM Medical Record Number: [id number] Patient Account Number: [id number] Date of Birth/Sex: Treating RN: 10/15/1960 (61 y.o. [name] Primary Care Sheilla Maris: Micheline Rough Other Clinician: Referring [name]: Treating [name]/Extender: [name] Weeks in Treatment: 0 Fall Risk Assessment Items Have you had 2 or more falls in the last 12 monthso 0 No [name] ([id number]) [phone] [id number] Page 3 of 4 Have you had any fall that resulted in injury in the last 12 monthso 0 No FALLS RISK SCREEN History of falling - immediate or within 3 months 0 No Secondary diagnosis (Do you have 2 or more medical diagnoseso) 0 No Ambulatory aid  None/bed rest/wheelchair/nurse 0 Yes Crutches/cane/walker 0 No Furniture 0 No Intravenous therapy Access/Saline/Heparin Lock 0 No Gait/Transferring Normal/ bed rest/ wheelchair 0 No Weak (short steps with or without shuffle, stooped but able to lift head while walking, may seek 0 No support from furniture) Impaired (short steps with shuffle, may have difficulty arising from chair, head down, impaired 0 No balance) Mental Status Oriented to own ability 0 Yes Electronic Signature(s) Signed: 01/23/2022 4:52:20 PM By: Samuella Bruin Entered By: Samuella Bruin on 01/23/2022 13:43:29 -------------------------------------------------------------------------------- Foot Assessment Details Patient Name: Date of Service: [name], [name] 01/23/2022 1:30 PM Medical Record Number: [id number] Patient Account Number: [id number] Date of Birth/Sex: Treating RN: May 29, 1960 (61 y.o. [name] Primary Care [name]: Theodis Shove Other Clinician: Referring [name]: Treating [name]/Extender: Beryle Flock Weeks in Treatment: 0 Foot Assessment Items Site Locations + = Sensation present, - = Sensation  absent, C = Callus, U = Ulcer R = Redness, W = Warmth, M = Maceration, PU = Pre-ulcerative lesion F = Fissure, S = Swelling, D = Dryness Assessment Right: Left: Other Deformity: No No Prior Foot Ulcer: No No Prior Amputation: No No Charcot Joint: No No Ambulatory Status: Ambulatory Without Help GaitKASHIRA, [name] ([id number]) [id number] Page 4 of 4 Electronic Signature(s) Signed: 01/23/2022 4:52:20 PM By: Gelene Mink By: Samuella Bruin on 01/23/2022 13:43:51 -------------------------------------------------------------------------------- Nutrition Risk Screening Details Patient Name: Date of Service: [name] 01/23/2022 1:30 PM Medical Record Number: [id number] Patient Account Number: [id number] Date of Birth/Sex: Treating RN: [birth year] (61 y.o. [name] Primary Care Harjot Dibello: Theodis Shove Other Clinician: Referring Alvita Fana: Treating Ashlyne Olenick/Extender: Beryle Flock Weeks in Treatment: 0 Height (in): Weight (lbs): Body Mass Index (BMI): Nutrition Risk Screening Items Score Screening NUTRITION RISK SCREEN: I have an illness or condition that made me change the kind and/or amount of food I eat 0 No I eat fewer than two meals per day 0 No I eat few fruits and vegetables, or milk products 0 No I have three or more drinks of beer, liquor or wine almost every day 0 No I have tooth or mouth problems that make it hard for me to eat 0 No I don't always have enough money to buy the food I need 0 No I eat alone most of the time 0 No I take three or more different prescribed or over-the-counter drugs a day 1 Yes Without wanting to, I have lost or gained 10 pounds in the last six months 0 No I am not always physically able to shop, cook and/or feed myself 0 No Nutrition Protocols Good Risk Protocol 0 No interventions needed Moderate Risk Protocol High Risk  Proctocol Risk Level: Good Risk Score: 1 Electronic Signature(s) Signed: 01/23/2022 4:52:20 PM By: Samuella Bruin Entered By: Samuella Bruin on 01/23/2022 13:43:40

## 2022-01-23 NOTE — Progress Notes (Addendum)
[name], [name] ([id number]) [id number] Page 1 of 10 Visit Report for 01/23/2022 Chief Complaint Document Details Patient Name: Date of Service: Ages, [name] 01/23/2022 1:30 PM Medical Record Number: [id number] Patient Account Number: [id number] Date of Birth/Sex: Treating RN: [birth year] (61 y.o. F) Primary Care Provider: Theodis Shove Other Clinician: Referring Provider: Treating Provider/Extender: Beryle Flock Weeks in Treatment: 0 Information Obtained from: Patient Chief Complaint RLE ulcer Electronic Signature(s) Signed: 01/23/2022 2:34:44 PM By: [name] MD FACS Entered By: [name] on 01/23/2022 14:34:44 -------------------------------------------------------------------------------- Debridement Details Patient Name: Date of Service: [name], [name] 01/23/2022 1:30 PM Medical Record Number: [id number] Patient Account Number: [id number] Date of Birth/Sex: Treating RN: 02-08-1961 (61 y.o. Caro Hight, Ladona Ridgel Primary Care Provider: Theodis Shove Other Clinician: Referring Provider: Treating Provider/Extender: Beryle Flock Weeks in Treatment: 0 Debridement Performed for Assessment: Wound #2 Right,Posterior Lower Leg Performed By: Physician [name], MD Debridement Type: Debridement Level of Consciousness (Pre-procedure): Awake and Alert Pre-procedure Verification/Time Out Yes - 13:59 Taken: Start Time: 13:59 Pain Control: Lidocaine 5% topical ointment T Area Debrided (L x W): otal 0.6 (cm) x 0.7 (cm) = 0.42 (cm) Tissue and other material debrided: Non-Viable, Slough, Subcutaneous, Skin: Epidermis, Slough Level: Skin/Subcutaneous Tissue Debridement Description: Excisional Instrument: Curette Bleeding: Minimum Hemostasis Achieved: Pressure Response to Treatment: Procedure was tolerated well Level of Consciousness (Post- Awake and  Alert procedure): Post Debridement Measurements of Total Wound Length: (cm) 0.6 Width: (cm) 0.7 Depth: (cm) 0.3 Volume: (cm) 0.099 Character of Wound/Ulcer Post Debridement: Improved Post Procedure Diagnosis Same as [name], [name] ([id number]) [id number] Page 2 of 10 Notes scribed for Dr. Lady Gary by Samuella Bruin, RN Electronic Signature(s) Signed: 01/23/2022 2:39:52 PM By: [name] MD FACS Signed: 01/23/2022 4:52:20 PM By: Gelene Mink By: Samuella Bruin on 01/23/2022 14:01:52 -------------------------------------------------------------------------------- HPI Details Patient Name: Date of Service: [name], [name] 01/23/2022 1:30 PM Medical Record Number: [id number] Patient Account Number: [id number] Date of Birth/Sex: Treating RN: December 27, 1960 (61 y.o. F) Primary Care Provider: Theodis Shove Other Clinician: Referring Provider: Treating Provider/Extender: Beryle Flock Weeks in Treatment: 0 History of Present Illness HPI Description: 02/16/2020 upon evaluation today patient actually appears to be doing poorly in regard to her left medial lower extremity ulcer. This is actually an area that she tells me she has had intermittent issues with over the years although has been closed for some time she typically uses compression right now she has juxta lite compression wraps. With that being said she tells me that this nonetheless open several weeks/months ago and has been given her trouble since. She does have a history of chronic venous insufficiency she is seeing specialist for this in the past she has had an ablation as well as sclerotherapy. With that being said she also has hypertension chronically which is managed by her primary care provider. In general she seems to be worsening overall with regard to the wound and states that she finally realized that she needed to come in and  have somebody look at this and not continue to try to manage this on her own. No fevers, chills, nausea, vomiting, or diarrhea. 02/23/2020 on evaluation today patient appears to be doing well with regard to her wound. This is showing some signs of improvement which is great news still were not quite at the point where I would like to be as far as the overall appearance of the wound is concerned but I do believe this  is better than last week. I do believe the Iodoflex is helping as well. 03/08/2020 upon evaluation today patient appears to be doing well with regard to her wound. She has been tolerating the dressing changes without complication. Fortunately I feel like she has made great progress with the Iodoflex but I feel like it may be the point rest to switch to something else possibly a collagen- based dressing at this time. 03/15/2020 upon evaluation today patient appears to be doing excellent in regard to her leg ulcer. She has been tolerating the dressing changes without complication. Fortunately there is no signs of active infection. Overall she is measuring a little bit smaller today which is great news. 03/22/2020 upon evaluation today patient appears to be doing well with regard to her wound. She has been tolerating the dressing changes without complication. Fortunately there is no signs of active infection at this time. 03/28/2020; patient I do not usually see however she has a wound on the left anterior lower leg secondary to chronic venous insufficiency we have been using silver collagen under compression. She arrives in clinic with a nonviable surface requiring debridement 04/12/2020 upon evaluation today patient appears to be doing well all things considered with regard to her leg ulcer. She is tolerating the dressing changes without complication there is minimal dry skin around the edges of the wound that may be trapping and stopping some of the events of the new skin I am can work  on that today. Otherwise the surface of the wound appears to be doing excellent. 04/19/2020 upon evaluation today patient appears to be doing well with regard to her leg ulcer. She has been tolerating dressing changes without complication. Fortunately there is no signs of active infection at this time. No fever chills noted overall very pleased with how things seem to be progressing. 04/26/2020 on evaluation today patient appears to be doing well with regard to her wound currently. Is showing signs of excellent improvement overall is filling in nicely and there does not appear to be any signs of infection. No fevers, chills, nausea, vomiting, or diarrhea. 05/03/2020 upon evaluation today patient appears to be doing well with regard to her wound on the leg. This overall showing signs of good improvement which is great she has some good epithelial growth and overall I think that things are moving in the correct direction. We likewise going to continue with the wound care measures as before since she seems to making such good improvement. 2/3; venous wound on the left medial leg. This is contracting. We are using Prisma and 3 layer compression. She has a stocking and waiting in the eventuality this heals. She is already using it on the right 05/17/2020 upon evaluation today patient appears to be doing well with regard to her leg ulcer. She has been tolerating the dressing changes without complication. Fortunately there is no signs of active infection which is great news and overall very pleased with where things stand today. No fevers, chills, nausea, vomiting, or diarrhea. 05/24/2020 upon evaluation today patient appears to be doing well with regard to her wound. Overall I feel like she is making excellent progress. There does not appear to be any signs of active infection which is great news. 05/31/2020 upon evaluation today patient appears to be doing well with regard to her wound. There does not appear to  be any signs of active infection which is great news overall I am extremely pleased with where things stand today. READMISSION 01/23/2022 She  returns to clinic today with a new wound on her right posterior calf. She says that she was cleaning out an old shed near the middle of August this year and then noticed what seemed to be a bug bite on her right posterior calf. It was itchy, red, and raised. By the end of September, an ulcer had developed. She has been applying various topical creams such as hydrocortisone and others to the site. When it was not improving, she made an appointment in the wound care center. ABI in clinic today was 0.97. On her right posterior calf, there is a circular wound with necrotic fat and black eschar present. There is no purulent drainage or malodor. The periwound skin is in good condition with just a little induration that appears to be secondary to inflammation. Hilton CorkWATKINS, [name] H ([id number]) [id number] Page 3 of 10 Electronic Signature(s) Signed: 01/23/2022 2:36:47 PM By: Duanne Guessannon, Giordana Weinheimer MD FACS Entered By: Duanne Guessannon, Neven Fina on 01/23/2022 14:36:47 -------------------------------------------------------------------------------- Physical Exam Details Patient Name: Date of Service: [name] [name], [name] 01/23/2022 1:30 PM Medical Record Number: [id number] Patient Account Number: [id number] Date of Birth/Sex: Treating RN: 04/03/1961 (61 y.o. F) Primary Care Provider: Theodis ShoveKoberlein, Junell Other Clinician: Referring Provider: Treating Provider/Extender: Beryle Flockannon, Janiya Millirons Koberlein, Junell Weeks in Treatment: 0 Constitutional Slightly hypertensive. Slightly tachycardic, asymptomatic.. . . No acute distress. Respiratory Normal work of breathing on room air.. Notes 01/23/2022: On her right posterior calf, there is a circular wound with necrotic fat and black eschar present. There is no purulent drainage or malodor. The periwound skin is in  good condition with just a little induration that appears to be secondary to inflammation. Electronic Signature(s) Signed: 01/23/2022 2:37:25 PM By: Duanne Guessannon, Kiara Mcdowell MD FACS Entered By: Duanne Guessannon, Myliah Medel on 01/23/2022 14:37:25 -------------------------------------------------------------------------------- Physician Orders Details Patient Name: Date of Service: [name] [name], [name] 01/23/2022 1:30 PM Medical Record Number: [id number] Patient Account Number: [id number] Date of Birth/Sex: Treating RN: 01/29/1961 (61 y.o. Fredderick PhenixF) Herrington, Taylor Primary Care Provider: Theodis ShoveKoberlein, Junell Other Clinician: Referring Provider: Treating Provider/Extender: Beryle Flockannon, Tauriel Scronce Koberlein, Junell Weeks in Treatment: 0 Verbal / Phone Orders: No Diagnosis Coding ICD-10 Coding Code Description [phone] Non-pressure chronic ulcer of right calf with fat layer exposed E66.01 Morbid (severe) obesity due to excess calories I10 Essential (primary) hypertension R60.0 Localized edema Follow-up Appointments ppointment in 1 week. - Dr. Lady Garyannon - room 2 Return A Anesthetic (In clinic) Topical Lidocaine 5% applied to wound bed Bathing/ Shower/ Hygiene May shower with protection but do not get wound dressing(s) wet. Edema Control - Lymphedema / SCD / Marcial PacasOther Rabanal, Kimberely H ([id number]) [id number] Page 4 of 10 Bilateral Lower Extremities Elevate legs to the level of the heart or above for 30 minutes daily and/or when sitting, a frequency of: - throughout the day Avoid standing for long periods of time. Exercise regularly Moisturize legs daily. Compression stocking or Garment 20-30 mm/Hg pressure to: - left leg daily. Apply first thing in the morning, remove at night. Wound Treatment Wound #2 - Lower Leg Wound Laterality: Right, Posterior Cleanser: Soap and Water 1 x Per Week/30 Days Discharge Instructions: May shower and wash wound with dial antibacterial soap and water prior to  dressing change. Cleanser: Wound Cleanser 1 x Per Week/30 Days Discharge Instructions: Cleanse the wound with wound cleanser prior to applying a clean dressing using gauze sponges, not tissue or cotton balls. Peri-Wound Care: Sween Lotion (Moisturizing lotion) 1 x Per Week/30 Days Discharge Instructions: Apply moisturizing lotion as directed Prim Dressing: KerraCel Ag Gelling Fiber  Dressing, 2x2 in (silver alginate) 1 x Per Week/30 Days ary Discharge Instructions: Apply silver alginate to wound bed as instructed Secondary Dressing: Woven Gauze Sponge, Non-Sterile 4x4 in 1 x Per Week/30 Days Discharge Instructions: Apply over primary dressing as directed. Secured With: Transpore Surgical Tape, 2x10 (in/yd) 1 x Per Week/30 Days Discharge Instructions: Secure dressing with tape as directed. Compression Wrap: ThreePress (3 layer compression wrap) 1 x Per Week/30 Days Discharge Instructions: Apply three layer compression as directed. Patient Medications llergies: Iodinated Contrast Media, Sulfa (Sulfonamide Antibiotics) A Notifications Medication Indication Start End 01/23/2022 lidocaine DOSE topical 5 % ointment - ointment topical Electronic Signature(s) Signed: 01/23/2022 2:39:52 PM By: [name] MD FACS Entered By: [name] on 01/23/2022 14:37:48 -------------------------------------------------------------------------------- Problem List Details Patient Name: Date of Service: [name], [name] 01/23/2022 1:30 PM Medical Record Number: [id number] Patient Account Number: [id number] Date of Birth/Sex: Treating RN: 09-24-1960 (61 y.o. F) Primary Care Provider: Theodis Shove Other Clinician: Referring Provider: Treating Provider/Extender: Beryle Flock Weeks in Treatment: 0 Active Problems ICD-10 Encounter Code Description Active Date MDM Diagnosis L97.212 Non-pressure chronic ulcer of right calf with fat layer exposed 01/23/2022 No  Yes E66.01 Morbid (severe) obesity due to excess calories 01/23/2022 No Yes I10 Essential (primary) hypertension 01/23/2022 No Yes [name], [name] ([id number]) [id number] Page 5 of 10 R60.0 Localized edema 01/23/2022 No Yes Inactive Problems Resolved Problems Electronic Signature(s) Signed: 01/23/2022 2:34:24 PM By: [name] MD FACS Previous Signature: 01/23/2022 1:33:14 PM Version By: [name] MD FACS Entered By: [name] on 01/23/2022 14:34:24 -------------------------------------------------------------------------------- Progress Note Details Patient Name: Date of Service: [name], [name] 01/23/2022 1:30 PM Medical Record Number: [id number] Patient Account Number: [id number] Date of Birth/Sex: Treating RN: [birth year] (61 y.o. F) Primary Care Provider: Theodis Shove Other Clinician: Referring Provider: Treating Provider/Extender: Beryle Flock Weeks in Treatment: 0 Subjective Chief Complaint Information obtained from Patient RLE ulcer History of Present Illness (HPI) 02/16/2020 upon evaluation today patient actually appears to be doing poorly in regard to her left medial lower extremity ulcer. This is actually an area that she tells me she has had intermittent issues with over the years although has been closed for some time she typically uses compression right now she has juxta lite compression wraps. With that being said she tells me that this nonetheless open several weeks/months ago and has been given her trouble since. She does have a history of chronic venous insufficiency she is seeing specialist for this in the past she has had an ablation as well as sclerotherapy. With that being said she also has hypertension chronically which is managed by her primary care provider. In general she seems to be worsening overall with regard to the wound and states that she finally realized that she needed to  come in and have somebody look at this and not continue to try to manage this on her own. No fevers, chills, nausea, vomiting, or diarrhea. 02/23/2020 on evaluation today patient appears to be doing well with regard to her wound. This is showing some signs of improvement which is great news still were not quite at the point where I would like to be as far as the overall appearance of the wound is concerned but I do believe this is better than last week. I do believe the Iodoflex is helping as well. 03/08/2020 upon evaluation today patient appears to be doing well with regard to her wound. She has been tolerating the dressing changes without  complication. Fortunately I feel like she has made great progress with the Iodoflex but I feel like it may be the point rest to switch to something else possibly a collagen- based dressing at this time. 03/15/2020 upon evaluation today patient appears to be doing excellent in regard to her leg ulcer. She has been tolerating the dressing changes without complication. Fortunately there is no signs of active infection. Overall she is measuring a little bit smaller today which is great news. 03/22/2020 upon evaluation today patient appears to be doing well with regard to her wound. She has been tolerating the dressing changes without complication. Fortunately there is no signs of active infection at this time. 03/28/2020; patient I do not usually see however she has a wound on the left anterior lower leg secondary to chronic venous insufficiency we have been using silver collagen under compression. She arrives in clinic with a nonviable surface requiring debridement 04/12/2020 upon evaluation today patient appears to be doing well all things considered with regard to her leg ulcer. She is tolerating the dressing changes without complication there is minimal dry skin around the edges of the wound that may be trapping and stopping some of the events of the new skin I am can  work on that today. Otherwise the surface of the wound appears to be doing excellent. 04/19/2020 upon evaluation today patient appears to be doing well with regard to her leg ulcer. She has been tolerating dressing changes without complication. Fortunately there is no signs of active infection at this time. No fever chills noted overall very pleased with how things seem to be progressing. 04/26/2020 on evaluation today patient appears to be doing well with regard to her wound currently. Is showing signs of excellent improvement overall is filling in nicely and there does not appear to be any signs of infection. No fevers, chills, nausea, vomiting, or diarrhea. 05/03/2020 upon evaluation today patient appears to be doing well with regard to her wound on the leg. This overall showing signs of good improvement which is great she has some good epithelial growth and overall I think that things are moving in the correct direction. We likewise going to continue with the wound care measures as before since she seems to making such good improvement. 2/3; venous wound on the left medial leg. This is contracting. We are using Prisma and 3 layer compression. She has a stocking and waiting in the eventuality this heals. She is already using it on the right 05/17/2020 upon evaluation today patient appears to be doing well with regard to her leg ulcer. She has been tolerating the dressing changes without complication. Fortunately there is no signs of active infection which is great news and overall very pleased with where things stand today. No fevers, chills, nausea, [name], [name] ([id number]) [phone].pdf Page 6 of 10 vomiting, or diarrhea. 05/24/2020 upon evaluation today patient appears to be doing well with regard to her wound. Overall I feel like she is making excellent progress. There does not appear to be any signs of active infection which is great news. 05/31/2020 upon evaluation  today patient appears to be doing well with regard to her wound. There does not appear to be any signs of active infection which is great news overall I am extremely pleased with where things stand today. READMISSION 01/23/2022 She returns to clinic today with a new wound on her right posterior calf. She says that she was cleaning out an old shed near the middle of August  this year and then noticed what seemed to be a bug bite on her right posterior calf. It was itchy, red, and raised. By the end of September, an ulcer had developed. She has been applying various topical creams such as hydrocortisone and others to the site. When it was not improving, she made an appointment in the wound care center. ABI in clinic today was 0.97. On her right posterior calf, there is a circular wound with necrotic fat and black eschar present. There is no purulent drainage or malodor. The periwound skin is in good condition with just a little induration that appears to be secondary to inflammation. Patient History Information obtained from Patient. Allergies Iodinated Contrast Media (Reaction: hives), Sulfa (Sulfonamide Antibiotics) (Reaction: hives) Family History Cancer - Mother, Diabetes - Father, Hypertension - Mother, Kidney Disease - Mother, Lung Disease - Father, Thyroid Problems - Paternal Grandparents, No family history of Heart Disease, Seizures, Stroke, Tuberculosis. Social History Never smoker, Marital Status - Married, Alcohol Use - Never, Drug Use - No History, Caffeine Use - Daily - coffee. Medical History Eyes Denies history of Cataracts, Glaucoma, Optic Neuritis Ear/Nose/Mouth/Throat Denies history of Chronic sinus problems/congestion, Middle ear problems Hematologic/Lymphatic Patient has history of Anemia - iron Denies history of Hemophilia, Human Immunodeficiency Virus, Lymphedema, Sickle Cell Disease Respiratory Patient has history of Sleep Apnea - CPAP Denies history of Aspiration,  Asthma, Chronic Obstructive Pulmonary Disease (COPD), Pneumothorax, Tuberculosis Cardiovascular Patient has history of Hypertension, Peripheral Venous Disease Denies history of Angina, Arrhythmia, Congestive Heart Failure, Coronary Artery Disease, Deep Vein Thrombosis, Hypotension, Myocardial Infarction, Peripheral Arterial Disease, Phlebitis, Vasculitis Gastrointestinal Denies history of Cirrhosis , Colitis, Crohnoos, Hepatitis A, Hepatitis B, Hepatitis C Endocrine Denies history of Type I Diabetes, Type II Diabetes Genitourinary Denies history of End Stage Renal Disease Immunological Denies history of Lupus Erythematosus, Raynaudoos, Scleroderma Integumentary (Skin) Denies history of History of Burn Musculoskeletal Denies history of Gout, Rheumatoid Arthritis, Osteoarthritis, Osteomyelitis Neurologic Denies history of Dementia, Neuropathy, Quadriplegia, Paraplegia, Seizure Disorder Oncologic Denies history of Received Chemotherapy, Received Radiation Psychiatric Denies history of Anorexia/bulimia, Confinement Anxiety Hospitalization/Surgery History - cholecystectomy 1980s. - nephrolithasis 1980s. - plate and rod in right elbow surgery 2010. Medical A Surgical History Notes nd Genitourinary years ago kidney stones Oncologic skin Ca removed from back years ago Review of Systems (ROS) Eyes Complains or has symptoms of Glasses / Contacts. Ear/Nose/Mouth/Throat Denies complaints or symptoms of Chronic sinus problems or rhinitis. Gastrointestinal Denies complaints or symptoms of Frequent diarrhea, Nausea, Vomiting. Endocrine Denies complaints or symptoms of Heat/cold intolerance. Genitourinary Denies complaints or symptoms of Frequent urination. Integumentary (Skin) Complains or has symptoms of Wounds. Musculoskeletal Denies complaints or symptoms of Muscle Pain, Muscle Weakness. Neurologic Denies complaints or symptoms of Numbness/parasthesias. Psychiatric Denies  complaints or symptoms of Claustrophobia. [name], [name] ([id number]) [id number] Page 7 of 10 Objective Constitutional Slightly hypertensive. Slightly tachycardic, asymptomatic.Marland Kitchen No acute distress. Vitals Time Taken: 1:37 PM, Temperature: 98.2 F, Pulse: 102 bpm, Respiratory Rate: 18 breaths/min, Blood Pressure: 142/86 mmHg. Respiratory Normal work of breathing on room air.. General Notes: 01/23/2022: On her right posterior calf, there is a circular wound with necrotic fat and black eschar present. There is no purulent drainage or malodor. The periwound skin is in good condition with just a little induration that appears to be secondary to inflammation. Integumentary (Hair, Skin) Wound #2 status is Open. Original cause of wound was Insect Bite. The date acquired was: 11/21/2021. The wound is located on the Right,Posterior Lower Leg. The wound measures  0.6cm length x 0.7cm width x 0.3cm depth; 0.33cm^2 area and 0.099cm^3 volume. There is Fat Layer (Subcutaneous Tissue) exposed. There is no tunneling noted, however, there is undermining starting at 12:00 and ending at 6:00 with a maximum distance of 0.3cm. There is a medium amount of serosanguineous drainage noted. The wound margin is distinct with the outline attached to the wound base. There is small (1-33%) red granulation within the wound bed. There is a large (67-100%) amount of necrotic tissue within the wound bed including Eschar and Adherent Slough. The periwound skin appearance had no abnormalities noted for texture. The periwound skin appearance had no abnormalities noted for moisture. The periwound skin appearance had no abnormalities noted for color. Periwound temperature was noted as No Abnormality. Assessment Active Problems ICD-10 Non-pressure chronic ulcer of right calf with fat layer exposed Morbid (severe) obesity due to excess calories Essential (primary) hypertension Localized  edema Procedures Wound #2 Pre-procedure diagnosis of Wound #2 is a T be determined located on the Right,Posterior Lower Leg . There was a Excisional Skin/Subcutaneous Tissue o Debridement with a total area of 0.42 sq cm performed by Fredirick Maudlin, MD. With the following instrument(s): Curette to remove Non-Viable tissue/material. Material removed includes Subcutaneous Tissue, Slough, and Skin: Epidermis after achieving pain control using Lidocaine 5% topical ointment. A time out was conducted at 13:59, prior to the start of the procedure. A Minimum amount of bleeding was controlled with Pressure. The procedure was tolerated well. Post Debridement Measurements: 0.6cm length x 0.7cm width x 0.3cm depth; 0.099cm^3 volume. Character of Wound/Ulcer Post Debridement is improved. Post procedure Diagnosis Wound #2: Same as Pre-Procedure General Notes: scribed for Dr. Celine Ahr by Adline Peals, RN. Plan Follow-up Appointments: Return Appointment in 1 week. - Dr. Celine Ahr - room 2 Anesthetic: (In clinic) Topical Lidocaine 5% applied to wound bed Bathing/ Shower/ Hygiene: May shower with protection but do not get wound dressing(s) wet. Edema Control - Lymphedema / SCD / Other: Elevate legs to the level of the heart or above for 30 minutes daily and/or when sitting, a frequency of: - throughout the day Avoid standing for long periods of time. Exercise regularly Moisturize legs daily. Compression stocking or Garment 20-30 mm/Hg pressure to: - left leg daily. Apply first thing in the morning, remove at night. The following medication(s) was prescribed: lidocaine topical 5 % ointment ointment topical was prescribed at facility WOUND #2: - Lower Leg Wound Laterality: Right, Posterior Cleanser: Soap and Water 1 x Per Week/30 Days Discharge Instructions: May shower and wash wound with dial antibacterial soap and water prior to dressing change. Cleanser: Wound Cleanser 1 x Per Week/30 Days [name], [name] ([id number]) [phone].pdf Page 8 of 10 Discharge Instructions: Cleanse the wound with wound cleanser prior to applying a clean dressing using gauze sponges, not tissue or cotton balls. Peri-Wound Care: Sween Lotion (Moisturizing lotion) 1 x Per Week/30 Days Discharge Instructions: Apply moisturizing lotion as directed Prim Dressing: KerraCel Ag Gelling Fiber Dressing, 2x2 in (silver alginate) 1 x Per Week/30 Days ary Discharge Instructions: Apply silver alginate to wound bed as instructed Secondary Dressing: Woven Gauze Sponge, Non-Sterile 4x4 in 1 x Per Week/30 Days Discharge Instructions: Apply over primary dressing as directed. Secured With: Transpore Surgical T ape, 2x10 (in/yd) 1 x Per Week/30 Days Discharge Instructions: Secure dressing with tape as directed. Com pression Wrap: ThreePress (3 layer compression wrap) 1 x Per Week/30 Days Discharge Instructions: Apply three layer compression as directed. 01/23/2022: On her right posterior calf, there is  a circular wound with necrotic fat and black eschar present. There is no purulent drainage or malodor. The periwound skin is in good condition with just a little induration that appears to be secondary to inflammation. I used a curette to debride slough, eschar, and nonviable subcutaneous tissue from her wound. We will pack the wound with silver alginate and apply 3 layer compression. She was reminded to elevate her leg is much as possible throughout the day and at night when she is sleeping. Follow-up in 1 week. Electronic Signature(s) Signed: 01/23/2022 2:38:21 PM By: [name] MD FACS Entered By: [name] on 01/23/2022 14:38:21 -------------------------------------------------------------------------------- HxROS Details Patient Name: Date of Service: [name], [name] 01/23/2022 1:30 PM Medical Record Number: [id number] Patient Account Number: [id number] Date of Birth/Sex: Treating  RN: [birth year] (61 y.o. [name] Primary Care Provider: Theodis Shove Other Clinician: Referring Provider: Treating Provider/Extender: Beryle Flock Weeks in Treatment: 0 Information Obtained From Patient Eyes Complaints and Symptoms: Positive for: Glasses / Contacts Medical History: Negative for: Cataracts; Glaucoma; Optic Neuritis Ear/Nose/Mouth/Throat Complaints and Symptoms: Negative for: Chronic sinus problems or rhinitis Medical History: Negative for: Chronic sinus problems/congestion; Middle ear problems Gastrointestinal Complaints and Symptoms: Negative for: Frequent diarrhea; Nausea; Vomiting Medical History: Negative for: Cirrhosis ; Colitis; Crohns; Hepatitis A; Hepatitis B; Hepatitis C Endocrine Complaints and Symptoms: Negative for: Heat/cold intolerance Medical History: Negative for: Type I Diabetes; Type II Diabetes [id number] [name], [name] ([id number]) [id number] Page 9 of 10 Complaints and Symptoms: Negative for: Frequent urination Medical History: Negative for: End Stage Renal Disease Past Medical History Notes: years ago kidney stones Integumentary (Skin) Complaints and Symptoms: Positive for: Wounds Medical History: Negative for: History of Burn Musculoskeletal Complaints and Symptoms: Negative for: Muscle Pain; Muscle Weakness Medical History: Negative for: Gout; Rheumatoid Arthritis; Osteoarthritis; Osteomyelitis Neurologic Complaints and Symptoms: Negative for: Numbness/parasthesias Medical History: Negative for: Dementia; Neuropathy; Quadriplegia; Paraplegia; Seizure Disorder Psychiatric Complaints and Symptoms: Negative for: Claustrophobia Medical History: Negative for: Anorexia/bulimia; Confinement Anxiety Hematologic/Lymphatic Medical History: Positive for: Anemia - iron Negative for: Hemophilia; Human Immunodeficiency Virus; Lymphedema; Sickle Cell  Disease Respiratory Medical History: Positive for: Sleep Apnea - CPAP Negative for: Aspiration; Asthma; Chronic Obstructive Pulmonary Disease (COPD); Pneumothorax; Tuberculosis Cardiovascular Medical History: Positive for: Hypertension; Peripheral Venous Disease Negative for: Angina; Arrhythmia; Congestive Heart Failure; Coronary Artery Disease; Deep Vein Thrombosis; Hypotension; Myocardial Infarction; Peripheral Arterial Disease; Phlebitis; Vasculitis Immunological Medical History: Negative for: Lupus Erythematosus; Raynauds; Scleroderma Oncologic Medical History: Negative for: Received Chemotherapy; Received Radiation Past Medical History Notes: skin Ca removed from back years ago Immunizations Pneumococcal Vaccine: Received Pneumococcal Vaccination: No Implantable Devices None Hospitalization / Surgery History Type of Hospitalization/Surgery [name], [name] ([id number]) [id number] Page 10 of 10 cholecystectomy 1980s nephrolithasis 1980s plate and rod in right elbow surgery 2010 Family and Social History Cancer: Yes - Mother; Diabetes: Yes - Father; Heart Disease: No; Hypertension: Yes - Mother; Kidney Disease: Yes - Mother; Lung Disease: Yes - Father; Seizures: No; Stroke: No; Thyroid Problems: Yes - Paternal Grandparents; Tuberculosis: No; Never smoker; Marital Status - Married; Alcohol Use: Never; Drug Use: No History; Caffeine Use: Daily - coffee; Financial Concerns: No; Food, Clothing or Shelter Needs: No; Support System Lacking: No; Transportation Concerns: No Electronic Signature(s) Signed: 01/23/2022 2:39:52 PM By: [name] MD FACS Signed: 01/23/2022 4:52:20 PM By: Samuella Bruin Entered By: Samuella Bruin on 01/23/2022 13:42:07 -------------------------------------------------------------------------------- SuperBill Details Patient Name: Date of Service: [name], [name] 01/23/2022 Medical Record Number:  [id number] Patient  Account Number: [id number] Date of Birth/Sex: Treating RN: 1960-05-07 (61 y.o. F) Primary Care Provider: Theodis Shove Other Clinician: Referring Provider: Treating Provider/Extender: Beryle Flock Weeks in Treatment: 0 Diagnosis Coding ICD-10 Codes Code Description [phone] Non-pressure chronic ulcer of right calf with fat layer exposed E66.01 Morbid (severe) obesity due to excess calories I10 Essential (primary) hypertension R60.0 Localized edema Facility Procedures : CPT4 Code: [id number] Description: 99213 - WOUND CARE VISIT-LEV 3 EST PT Modifier: 25 Quantity: 1 : CPT4 Code: [id number] Description: 11042 - DEB SUBQ TISSUE 20 SQ CM/< ICD-10 Diagnosis Description L97.212 Non-pressure chronic ulcer of right calf with fat layer exposed Modifier: Quantity: 1 Physician Procedures : CPT4 Code Description Modifier [id number] 99214 - WC PHYS LEVEL 4 - EST PT 25 ICD-10 Diagnosis Description L97.212 Non-pressure chronic ulcer of right calf with fat layer exposed E66.01 Morbid (severe) obesity due to excess calories I10 Essential  (primary) hypertension R60.0 Localized edema Quantity: 1 : [id number] 11042 - WC PHYS SUBQ TISS 20 SQ CM ICD-10 Diagnosis Description L97.212 Non-pressure chronic ulcer of right calf with fat layer exposed Quantity: 1 Electronic Signature(s) Signed: 01/23/2022 3:31:27 PM By: [name] MD FACS Signed: 01/23/2022 4:52:20 PM By: Samuella Bruin Previous Signature: 01/23/2022 2:38:40 PM Version By: [name] MD FACS Entered By: Samuella Bruin on 01/23/2022 15:19:13

## 2022-01-23 NOTE — Progress Notes (Signed)
[name], [name] ([id number]) [id number] Page 1 of 9 Visit Report for 01/23/2022 Allergy List Details Patient Name: Date of Service: [place], [name] 01/23/2022 1:30 PM Medical Record Number: [id number] Patient Account Number: [id number] Date of Birth/Sex: Treating RN: 1961-03-28 (61 y.o. [name] Primary Care Jesus Nevills: Theodis Shove Other Clinician: Referring Eloisa Chokshi: Treating Luretha Eberly/Extender: Beryle Flock Weeks in Treatment: 0 Allergies Active Allergies Iodinated Contrast Media Reaction: hives Sulfa (Sulfonamide Antibiotics) Reaction: hives Allergy Notes Electronic Signature(s) Signed: 01/23/2022 4:52:20 PM By: Samuella Bruin Entered By: Samuella Bruin on 01/23/2022 13:40:04 -------------------------------------------------------------------------------- Arrival Information Details Patient Name: Date of Service: [name], [name] 01/23/2022 1:30 PM Medical Record Number: [id number] Patient Account Number: [id number] Date of Birth/Sex: Treating RN: [birth year] (61 y.o. [name] Primary Care Zaiah Credeur: Theodis Shove Other Clinician: Referring Ajane Novella: Treating Shalise Rosado/Extender: Vladimir Faster in Treatment: 0 Visit Information Patient Arrived: Ambulatory Arrival Time: 13:30 Accompanied By: self Transfer Assistance: None Patient Identification Verified: Yes Secondary Verification Process Completed: Yes Patient Requires Transmission-Based Precautions: No Patient Has Alerts: No History Since Last Visit Electronic Signature(s) Signed: 01/23/2022 4:52:20 PM By: Samuella Bruin Entered By: Samuella Bruin on 01/23/2022 13:34:17 [name] ([id number]) [id number] Page 2 of 9 -------------------------------------------------------------------------------- Clinic Level of Care Assessment Details Patient Name: Date of  Service: [name] 01/23/2022 1:30 PM Medical Record Number: [id number] Patient Account Number: [id number] Date of Birth/Sex: Treating RN: 04-09-1960 (61 y.o. [name] Primary Care Delorise Hunkele: Theodis Shove Other Clinician: Referring Shirleyann Montero: Treating Ladislao Cohenour/Extender: Beryle Flock Weeks in Treatment: 0 Clinic Level of Care Assessment Items TOOL 1 Quantity Score X- 1 0 Use when EandM and Procedure is performed on INITIAL visit ASSESSMENTS - Nursing Assessment / Reassessment X- 1 20 General Physical Exam (combine w/ comprehensive assessment (listed just below) when performed on [name] pt. evals) X- 1 25 Comprehensive Assessment (HX, ROS, Risk Assessments, Wounds Hx, etc.) ASSESSMENTS - Wound and Skin Assessment / Reassessment []  - 0 Dermatologic / Skin Assessment (not related to wound area) ASSESSMENTS - Ostomy and/or Continence Assessment and Care []  - 0 Incontinence Assessment and Management []  - 0 Ostomy Care Assessment and Management (repouching, etc.) PROCESS - Coordination of Care X - Simple Patient / Family Education for ongoing care 1 15 []  - 0 Complex (extensive) Patient / Family Education for ongoing care X- 1 10 Staff obtains , Records, T Results / Process Orders est []  - 0 Staff telephones HHA, Nursing Homes / Clarify orders / etc []  - 0 Routine Transfer to another Facility (non-emergent condition) []  - 0 Routine Hospital Admission (non-emergent condition) X- 1 15 [name] Admissions / / Ordering NPWT Apligraf, etc. , []  - 0 Emergency Hospital Admission (emergent condition) PROCESS - Special Needs []  - 0 Pediatric / Minor Patient Management []  - 0 Isolation Patient Management []  - 0 Hearing / Language / Visual special needs []  - 0 Assessment of Community assistance (transportation, D/C planning, etc.) []  - 0 Additional assistance / Altered mentation []  - 0 Support Surface(s)  Assessment (bed, cushion, seat, etc.) INTERVENTIONS - Miscellaneous []  - 0 External ear exam []  - 0 Patient Transfer (multiple staff / / Similar devices) []  - 0 Simple Staple / Suture removal (25 or less) []  - 0 Complex Staple / Suture removal (26 or more) []  - 0 Hypo/Hyperglycemic Management (do not check if billed separately) X- 1 15 Ankle / Brachial Index (ABI) - do not check if billed  separately Has the patient been seen at the hospital within the last three years: Yes Total Score: 100 Level Of Care: [name]/Established - Level 3 Electronic Signature(s) Signed: 01/23/2022 4:52:20 PM By: Adline Peals Entered By: Adline Peals on 01/23/2022 14:29:23 [name] ([id number]) [id number] Page 3 of 9 -------------------------------------------------------------------------------- Encounter Discharge Information Details Patient Name: Date of Service: [name] 01/23/2022 1:30 PM Medical Record Number: [id number] Patient Account Number: [id number] Date of Birth/Sex: Treating RN: 1960/09/30 (61 y.o. [name] Primary Care Eldana Isip: Micheline Rough Other Clinician: Referring Hershel Corkery: Treating Klein Willcox/Extender: Maye Hides in Treatment: 0 Encounter Discharge Information Items Post Procedure Vitals Discharge Condition: Stable Temperature (F): 98.2 Ambulatory Status: Ambulatory Pulse (bpm): 102 Discharge Destination: Home Respiratory Rate (breaths/min): 18 Transportation: Private Auto Blood Pressure (mmHg): 142/86 Accompanied By: self Schedule Follow-up Appointment: Yes Clinical Summary of Care: Patient Declined Electronic Signature(s) Signed: 01/23/2022 4:52:20 PM By: Adline Peals Entered By: Adline Peals on 01/23/2022 14:30:04 -------------------------------------------------------------------------------- Lower Extremity Assessment Details Patient Name: Date  of Service: [name], [name] 01/23/2022 1:30 PM Medical Record Number: [id number] Patient Account Number: [id number] Date of Birth/Sex: Treating RN: [birth year] (61 y.o. [name] Primary Care Euline Kimbler: Micheline Rough Other Clinician: Referring Kynley Metzger: Treating Yeraldy Spike/Extender: Campbell Stall Weeks in Treatment: 0 Edema Assessment Assessed: [Left: No] [Right: No] [Left: Edema] [Right: :] Calf Left: Right: Point of Measurement: From Medial Instep 47 cm Ankle Left: Right: Point of Measurement: From Medial Instep 25 cm Vascular Assessment Pulses: Dorsalis Pedis Palpable: [Right:Yes] Blood Pressure: Brachial: [Right:142] Ankle: [Right:Dorsalis Pedis: 138 0.97] Electronic Signature(s) Signed: 01/23/2022 4:52:20 PM By: Adline Peals Entered By: Adline Peals on 01/23/2022 13:52:50 [name] ([id number]) [id number] Page 4 of 9 -------------------------------------------------------------------------------- Multi Wound Chart Details Patient Name: Date of Service: [place], [name] 01/23/2022 1:30 PM Medical Record Number: [id number] Patient Account Number: [id number] Date of Birth/Sex: Treating RN: [birth year] (61 y.o. F) Primary Care Hurshell Dino: Micheline Rough Other Clinician: Referring Quandre Polinski: Treating Charleton Deyoung/Extender: Campbell Stall Weeks in Treatment: 0 Vital Signs Height(in): Pulse(bpm): 102 Weight(lbs): Blood Pressure(mmHg): 142/86 Body Mass Index(BMI): Temperature(F): 98.2 Respiratory Rate(breaths/min): 18 [2:Photos:] [N/A:N/A] Right, Posterior Lower Leg N/A N/A Wound Location: Insect Bite N/A N/A Wounding Event: T be determined o N/A N/A Primary Etiology: Anemia, Sleep Apnea, Hypertension, N/A N/A Comorbid History: Peripheral Venous Disease 11/21/2021 N/A N/A Date Acquired: 0 N/A N/A Weeks of Treatment: Open N/A N/A Wound Status: No N/A  N/A Wound Recurrence: 0.6x0.7x0.3 N/A N/A Measurements L x W x D (cm) 0.33 N/A N/A A (cm) : rea 0.099 N/A N/A Volume (cm) : 12 Starting Position 1 (o'clock): 6 Ending Position 1 (o'clock): 0.3 Maximum Distance 1 (cm): Yes N/A N/A Undermining: Full Thickness Without Exposed N/A N/A Classification: Support Structures Medium N/A N/A Exudate A mount: Serosanguineous N/A N/A Exudate Type: red, brown N/A N/A Exudate Color: Distinct, outline attached N/A N/A Wound Margin: Small (1-33%) N/A N/A Granulation A mount: Red N/A N/A Granulation Quality: Large (67-100%) N/A N/A Necrotic A mount: Eschar, Adherent Slough N/A N/A Necrotic Tissue: Fat Layer (Subcutaneous Tissue): Yes N/A N/A Exposed Structures: Fascia: No Tendon: No Muscle: No Joint: No Bone: No None N/A N/A Epithelialization: Debridement - Excisional N/A N/A Debridement: Pre-procedure Verification/Time Out 13:59 N/A N/A Taken: Lidocaine 5% topical ointment N/A N/A Pain Control: Subcutaneous, Slough N/A N/A Tissue Debrided: Skin/Subcutaneous Tissue N/A N/A Level: 0.42 N/A N/A Debridement A (sq cm): rea Curette N/A N/A Instrument: Minimum N/A N/A Bleeding: Pressure  N/A N/A Hemostasis A chieved: Procedure was tolerated well N/A N/A Debridement Treatment Response: 0.6x0.7x0.3 N/A N/A Post Debridement Measurements L x W x D (cm) YARIXA, LIGHTCAP ([id number]) [id number] Page 5 of 9 0.099 N/A N/A Post Debridement Volume: (cm) No Abnormalities Noted N/A N/A Periwound Skin Texture: No Abnormalities Noted N/A N/A Periwound Skin Moisture: No Abnormalities Noted N/A N/A Periwound Skin Color: No Abnormality N/A N/A Temperature: Debridement N/A N/A Procedures Performed: Treatment Notes Wound #2 (Lower Leg) Wound Laterality: Right, Posterior Cleanser Soap and Water Discharge Instruction: May shower and wash wound with dial antibacterial soap and water prior to  dressing change. Wound Cleanser Discharge Instruction: Cleanse the wound with wound cleanser prior to applying a clean dressing using gauze sponges, not tissue or cotton balls. Peri-Wound Care Sween Lotion (Moisturizing lotion) Discharge Instruction: Apply moisturizing lotion as directed Topical Primary Dressing KerraCel Ag Gelling Fiber Dressing, 2x2 in (silver alginate) Discharge Instruction: Apply silver alginate to wound bed as instructed Secondary Dressing Woven Gauze Sponge, Non-Sterile 4x4 in Discharge Instruction: Apply over primary dressing as directed. Secured With Transpore Surgical Tape, 2x10 (in/yd) Discharge Instruction: Secure dressing with tape as directed. Compression Wrap ThreePress (3 layer compression wrap) Discharge Instruction: Apply three layer compression as directed. Compression Stockings Add-Ons Electronic Signature(s) Signed: 01/23/2022 2:34:31 PM By: [name] MD FACS Entered By: [name] on 01/23/2022 14:34:31 -------------------------------------------------------------------------------- Multi-Disciplinary Care Plan Details Patient Name: Date of Service: [place], [name] 01/23/2022 1:30 PM Medical Record Number: [id number] Patient Account Number: [id number] Date of Birth/Sex: Treating RN: May 31, 1960 (61 y.o. [name] Primary Care Zorion Nims: Theodis Shove Other Clinician: Referring Devonda Pequignot: Treating Limuel Nieblas/Extender: Beryle Flock Weeks in Treatment: 0 Active Inactive Necrotic Tissue Nursing Diagnoses: Impaired tissue integrity related to necrotic/devitalized tissue Knowledge deficit related to management of necrotic/devitalized tissue MARYHELEN, LINDLER ([id number]) [phone].pdf Page 6 of 9 Goals: Necrotic/devitalized tissue will be minimized in the wound bed Date Initiated: 01/23/2022 Target Resolution Date: 03/15/2022 Goal Status: Active Patient/caregiver will  verbalize understanding of reason and process for debridement of necrotic tissue Date Initiated: 01/23/2022 Target Resolution Date: 03/15/2022 Goal Status: Active Interventions: Assess patient pain level pre-, during and post procedure and prior to discharge Provide education on necrotic tissue and debridement process Treatment Activities: Apply topical anesthetic as ordered : 01/23/2022 Notes: Wound/Skin Impairment Nursing Diagnoses: Impaired tissue integrity Knowledge deficit related to ulceration/compromised skin integrity Goals: Patient/caregiver will verbalize understanding of skin care regimen Date Initiated: 01/23/2022 Target Resolution Date: 03/15/2022 Goal Status: Active Interventions: Assess patient/caregiver ability to obtain necessary supplies Assess ulceration(s) every visit Treatment Activities: Skin care regimen initiated : 01/23/2022 Topical wound management initiated : 01/23/2022 Notes: Electronic Signature(s) Signed: 01/23/2022 4:52:20 PM By: Samuella Bruin Entered By: Samuella Bruin on 01/23/2022 14:29:10 -------------------------------------------------------------------------------- Pain Assessment Details Patient Name: Date of Service: [name], [name] 01/23/2022 1:30 PM Medical Record Number: [id number] Patient Account Number: [id number] Date of Birth/Sex: Treating RN: 1960-12-24 (61 y.o. [name] Primary Care Brysyn Brandenberger: Theodis Shove Other Clinician: Referring Annise Boran: Treating Marieme Mcmackin/Extender: Beryle Flock Weeks in Treatment: 0 Active Problems Location of Pain Severity and Description of Pain Patient Has Paino Yes Site Locations Pain LocationFAJR, FIFE ([id number]) [phone].pdf Page 7 of 9 Pain Location: Pain in Ulcers Duration of the Pain. Constant / Intermittento Intermittent Rate the pain. Current Pain Level: 3 Pain Management and Medication Current Pain  Management: Electronic Signature(s) Signed: 01/23/2022 4:52:20 PM By: Samuella Bruin Entered By: Samuella Bruin on 01/23/2022 13:34:37 -------------------------------------------------------------------------------- Patient/Caregiver  Education Details Patient Name: Date of Service: [name] 10/18/2023andnbsp1:30 PM Medical Record Number: [id number] Patient Account Number: [id number] Date of Birth/Gender: Treating RN: Nov 14, 1960 (61 y.o. [name] Primary Care Physician: Theodis Shove Other Clinician: Referring Physician: Treating Physician/Extender: Vladimir Faster in Treatment: 0 Education Assessment Education Provided To: Patient Education Topics Provided Wound Debridement: Methods: Explain/Verbal Responses: Reinforcements needed, State content correctly Electronic Signature(s) Signed: 01/23/2022 4:52:20 PM By: Samuella Bruin Entered By: Samuella Bruin on 01/23/2022 13:53:05 -------------------------------------------------------------------------------- Wound Assessment Details Patient Name: Date of Service: [name], [name] 01/23/2022 1:30 PM Medical Record Number: [id number] Patient Account Number: [id number] Date of Birth/Sex: Treating RN: 08-23-1960 (61 y.o. [name] Primary Care Adahlia Stembridge: Theodis Shove Other Clinician: Referring Kendre Sires: Treating Faline Langer/Extender: Beryle Flock [place], Romie Minus ([id number]) [id number] Page 8 of 9 Weeks in Treatment: 0 Wound Status Wound Number: 2 Primary T be determined o Etiology: Wound Location: Right, Posterior Lower Leg Wound Status: Open Wounding Event: Insect Bite Comorbid Anemia, Sleep Apnea, Hypertension, Peripheral Venous Date Acquired: 11/21/2021 History: Disease Weeks Of Treatment: 0 Clustered Wound: No Photos Wound Measurements Length: (cm) 0.6 Width: (cm) 0.7 Depth: (cm)  0.3 Area: (cm) 0.33 Volume: (cm) 0.099 % Reduction in Area: % Reduction in Volume: Epithelialization: None Tunneling: No Undermining: Yes Starting Position (o'clock): 12 Ending Position (o'clock): 6 Maximum Distance: (cm) 0.3 Wound Description Classification: Full Thickness Without Exposed Support Structures Wound Margin: Distinct, outline attached Exudate Amount: Medium Exudate Type: Serosanguineous Exudate Color: red, brown Foul Odor After Cleansing: No Slough/Fibrino Yes Wound Bed Granulation Amount: Small (1-33%) Exposed Structure Granulation Quality: Red Fascia Exposed: No Necrotic Amount: Large (67-100%) Fat Layer (Subcutaneous Tissue) Exposed: Yes Necrotic Quality: Eschar, Adherent Slough Tendon Exposed: No Muscle Exposed: No Joint Exposed: No Bone Exposed: No Periwound Skin Texture Texture Color No Abnormalities Noted: Yes No Abnormalities Noted: Yes Moisture Temperature / Pain No Abnormalities Noted: Yes Temperature: No Abnormality Treatment Notes Wound #2 (Lower Leg) Wound Laterality: Right, Posterior Cleanser Soap and Water Discharge Instruction: May shower and wash wound with dial antibacterial soap and water prior to dressing change. Wound Cleanser Discharge Instruction: Cleanse the wound with wound cleanser prior to applying a clean dressing using gauze sponges, not tissue or cotton balls. Peri-Wound Care Sween Lotion (Moisturizing lotion) Discharge Instruction: Apply moisturizing lotion as directed Topical SIENNAH, BARRASSO ([id number]) [phone].pdf Page 9 of 9 Primary Dressing KerraCel Ag Gelling Fiber Dressing, 2x2 in (silver alginate) Discharge Instruction: Apply silver alginate to wound bed as instructed Secondary Dressing Woven Gauze Sponge, Non-Sterile 4x4 in Discharge Instruction: Apply over primary dressing as directed. Secured With Transpore Surgical Tape, 2x10 (in/yd) Discharge Instruction: Secure dressing  with tape as directed. Compression Wrap ThreePress (3 layer compression wrap) Discharge Instruction: Apply three layer compression as directed. Compression Stockings Add-Ons Electronic Signature(s) Signed: 01/23/2022 4:52:20 PM By: Samuella Bruin Entered By: Samuella Bruin on 01/23/2022 13:56:07 -------------------------------------------------------------------------------- Vitals Details Patient Name: Date of Service: [name], [name] 01/23/2022 1:30 PM Medical Record Number: [id number] Patient Account Number: [id number] Date of Birth/Sex: Treating RN: 17-Oct-1960 (61 y.o. [name] Primary Care Collan Schoenfeld: Theodis Shove Other Clinician: Referring Jamorian Dimaria: Treating [place] Harral/Extender: Beryle Flock Weeks in Treatment: 0 Vital Signs Time Taken: 13:37 Temperature (F): 98.2 Pulse (bpm): 102 Respiratory Rate (breaths/min): 18 Blood Pressure (mmHg): 142/86 Reference Range: 80 - 120 mg / dl Electronic Signature(s) Signed: 01/23/2022 4:52:20 PM By: Samuella Bruin Entered By: Samuella Bruin on 01/23/2022 13:38:15

## 2022-01-31 ENCOUNTER — Encounter (HOSPITAL_BASED_OUTPATIENT_CLINIC_OR_DEPARTMENT_OTHER): Payer: BC Managed Care – PPO | Admitting: General Surgery

## 2022-01-31 DIAGNOSIS — L97212 Non-pressure chronic ulcer of right calf with fat layer exposed: Secondary | ICD-10-CM | POA: Diagnosis not present

## 2022-02-05 NOTE — Progress Notes (Signed)
AGAPE, HARDIMAN ([id number]) [id number] Page 1 of 8 Visit Report for 01/31/2022 Arrival Information Details Patient Name: Date of Service: [place], [name] H. 01/31/2022 3:00 PM Medical Record Number: [id number] Patient Account Number: [id number] Date of Birth/Sex: Treating RN: 07-Jul-1960 (61 y.o. F) Zochol, [name] Primary Care Shaundrea Carrigg: Theodis Shove Other Clinician: Referring Wallace Cogliano: Treating Lason Eveland/Extender: Vladimir Faster in Treatment: 1 Visit Information History Since Last Visit Added or deleted any medications: No Patient Arrived: Ambulatory Any new allergies or adverse reactions: No Arrival Time: 14:59 Had a fall or experienced change in No Accompanied By: self activities of daily living that may affect Transfer Assistance: None risk of falls: Patient Requires Transmission-Based Precautions: No Signs or symptoms of abuse/neglect since last visito No Patient Has Alerts: No Hospitalized since last visit: No Has Dressing in Place as Prescribed: Yes Has Compression in Place as Prescribed: Yes Pain Present Now: Yes Electronic Signature(s) Signed: 02/05/2022 4:16:20 PM By: Tommie Ard RN Entered By: Tommie Ard on 01/31/2022 15:00:36 -------------------------------------------------------------------------------- Compression Therapy Details Patient Name: Date of Service: [name], [name] 01/31/2022 3:00 PM Medical Record Number: [id number] Patient Account Number: [id number] Date of Birth/Sex: Treating RN: 1960/10/25 (61 y.o. Kateri Mc Primary Care Bernarr Longsworth: Theodis Shove Other Clinician: Referring Jarious Lyon: Treating Jabri Blancett/Extender: Beryle Flock Weeks in Treatment: 1 Compression Therapy Performed for Wound Assessment: Wound #2 Right,Posterior Lower Leg Performed By: Clinician Tommie Ard, RN Compression Type: Three Layer Post Procedure Diagnosis Same as  Pre-procedure Electronic Signature(s) Signed: 02/05/2022 4:16:20 PM By: Tommie Ard RN Entered By: Tommie Ard on 01/31/2022 15:32:20 -------------------------------------------------------------------------------- Encounter Discharge Information Details Patient Name: Date of Service: [name], [name] 01/31/2022 3:00 PM [name] ([id number]) [id number] Page 2 of 8 Medical Record Number: [id number] Patient Account Number: [id number] Date of Birth/Sex: Treating RN: 1960/07/22 (61 y.o. Kateri Mc Primary Care Jaelyn Cloninger: Theodis Shove Other Clinician: Referring Alexis Reber: Treating Janika Jedlicka/Extender: Vladimir Faster in Treatment: 1 Encounter Discharge Information Items Post Procedure Vitals Discharge Condition: Stable Temperature (F): 98.3 Ambulatory Status: Ambulatory Pulse (bpm): 99 Discharge Destination: Home Respiratory Rate (breaths/min): 20 Transportation: Private Auto Blood Pressure (mmHg): 186/80 Accompanied By: self Schedule Follow-up Appointment: Yes Clinical Summary of Care: Notes BP elevated, didn't take medication today. Electronic Signature(s) Signed: 02/05/2022 4:16:20 PM By: Tommie Ard RN Entered By: Tommie Ard on 01/31/2022 15:50:20 -------------------------------------------------------------------------------- Lower Extremity Assessment Details Patient Name: Date of Service: [place], [name] 01/31/2022 3:00 PM Medical Record Number: [id number] Patient Account Number: [id number] Date of Birth/Sex: Treating RN: 05-May-1960 (61 y.o. Kateri Mc Primary Care Marina Boerner: Theodis Shove Other Clinician: Referring Ulysses Alper: Treating Chrishaun Sasso/Extender: Beryle Flock Weeks in Treatment: 1 Edema Assessment Assessed: [Left: No] [Right: No] [Left: Edema] [Right: :] Calf Left: Right: Point of Measurement: From Medial Instep 48.5 cm Ankle Left: Right: Point  of Measurement: From Medial Instep 24 cm Vascular Assessment Pulses: Dorsalis Pedis Palpable: [Right:Yes] Electronic Signature(s) Signed: 02/05/2022 4:16:20 PM By: Tommie Ard RN Entered By: Tommie Ard on 01/31/2022 15:10:10 Multi Wound Chart Details -------------------------------------------------------------------------------- [name] ([id number]) [id number] Page 3 of 8 Patient Name: Date of Service: [place], [name] 01/31/2022 3:00 PM Medical Record Number: [id number] Patient Account Number: [id number] Date of Birth/Sex: Treating RN: 11/08/1960 (61 y.o. [name] Primary Care Latravis Grine: Theodis Shove Other Clinician: Referring Denishia Citro: Treating Shaqueta Casady/Extender: Beryle Flock Weeks in Treatment: 1 Vital Signs Height(in): Pulse(bpm): 99 Weight(lbs): Blood Pressure(mmHg): 180/86 Body Mass Index(BMI): Temperature(F): 98.3  Respiratory Rate(breaths/min): 20 Wound Assessments Wound Number: 2 N/A N/A Photos: N/A N/A Right, Posterior Lower Leg N/A N/A Wound Location: Insect Bite N/A N/A Wounding Event: T be determined o N/A N/A Primary Etiology: Anemia, Sleep Apnea, Hypertension, N/A N/A Comorbid History: Peripheral Venous Disease 11/21/2021 N/A N/A Date Acquired: 1 N/A N/A Weeks of Treatment: Open N/A N/A Wound Status: No N/A N/A Wound Recurrence: 1x1.1x0.3 N/A N/A Measurements L x W x D (cm) 0.864 N/A N/A A (cm) : rea 0.259 N/A N/A Volume (cm) : -161.80% N/A N/A % Reduction in A rea: -161.60% N/A N/A % Reduction in Volume: 10 Starting Position 1 (o'clock): 3 Ending Position 1 (o'clock): 0.5 Maximum Distance 1 (cm): Yes N/A N/A Undermining: Full Thickness Without Exposed N/A N/A Classification: Support Structures Medium N/A N/A Exudate A mount: Serosanguineous N/A N/A Exudate Type: red, brown N/A N/A Exudate Color: Distinct, outline attached N/A N/A Wound  Margin: Small (1-33%) N/A N/A Granulation A mount: Red N/A N/A Granulation Quality: Large (67-100%) N/A N/A Necrotic A mount: Fat Layer (Subcutaneous Tissue): Yes N/A N/A Exposed Structures: Fascia: No Tendon: No Muscle: No Joint: No Bone: No None N/A N/A Epithelialization: Debridement - Excisional N/A N/A Debridement: Pre-procedure Verification/Time Out 15:29 N/A N/A Taken: Lidocaine 5% topical ointment N/A N/A Pain Control: Subcutaneous, Slough N/A N/A Tissue Debrided: Skin/Subcutaneous Tissue N/A N/A Level: 1.1 N/A N/A Debridement A (sq cm): rea Curette N/A N/A Instrument: Minimum N/A N/A Bleeding: Pressure N/A N/A Hemostasis A chieved: 0 N/A N/A Procedural Pain: 0 N/A N/A Post Procedural Pain: Procedure was tolerated well N/A N/A Debridement Treatment Response: 1x1.1x0.3 N/A N/A Post Debridement Measurements L x W x D (cm) 0.259 N/A N/A Post Debridement Volume: (cm) No Abnormalities Noted N/A N/A Periwound Skin Texture: No Abnormalities Noted N/A N/A Periwound Skin Moisture: Rubor: Yes N/A N/A Periwound Skin Color: No Abnormality N/A N/A Temperature: Compression Therapy N/A N/A Procedures Performed: Debridement RAILEY, GLAD ([id number]) [id number] Page 4 of 8 Treatment Notes Electronic Signature(s) Signed: 01/31/2022 3:43:30 PM By: Fredirick Maudlin MD FACS Signed: 01/31/2022 4:24:03 PM By: Sabas Sous By: Fredirick Maudlin on 01/31/2022 15:43:30 -------------------------------------------------------------------------------- Multi-Disciplinary Care Plan Details Patient Name: Date of Service: [place], [name] 01/31/2022 3:00 PM Medical Record Number: [id number] Patient Account Number: [id number] Date of Birth/Sex: Treating RN: Jul 27, 1960 (61 y.o. [name] Primary Care Jaqwon Manfred: Micheline Rough Other Clinician: Referring Chelsie Burel: Treating Gaspare Netzel/Extender: Maye Hides in Treatment: 1 Active Inactive Necrotic Tissue Nursing Diagnoses: Impaired tissue integrity related to necrotic/devitalized tissue Knowledge deficit related to management of necrotic/devitalized tissue Goals: Necrotic/devitalized tissue will be minimized in the wound bed Date Initiated: 01/23/2022 Target Resolution Date: 03/15/2022 Goal Status: Active Patient/caregiver will verbalize understanding of reason and process for debridement of necrotic tissue Date Initiated: 01/23/2022 Target Resolution Date: 03/15/2022 Goal Status: Active Interventions: Assess patient pain level pre-, during and post procedure and prior to discharge Provide education on necrotic tissue and debridement process Treatment Activities: Apply topical anesthetic as ordered : 01/23/2022 Notes: Wound/Skin Impairment Nursing Diagnoses: Impaired tissue integrity Knowledge deficit related to ulceration/compromised skin integrity Goals: Patient/caregiver will verbalize understanding of skin care regimen Date Initiated: 01/23/2022 Target Resolution Date: 03/15/2022 Goal Status: Active Interventions: Assess patient/caregiver ability to obtain necessary supplies Assess ulceration(s) every visit Treatment Activities: Skin care regimen initiated : 01/23/2022 Topical wound management initiated : 01/23/2022 Notes: Electronic Signature(s) Signed: 02/05/2022 4:16:20 PM By: [name] RN [name] ([id number]) [id number] Page 5 of 8 Entered By: [name]  on 01/31/2022 15:32:58 -------------------------------------------------------------------------------- Pain Assessment Details Patient Name: Date of Service: [place], [name] 01/31/2022 3:00 PM Medical Record Number: [id number] Patient Account Number: [id number] Date of Birth/Sex: Treating RN: [birth year] (61 y.o. Kateri Mc Primary Care Ambra Haverstick: Theodis Shove Other Clinician: Referring  Machelle Raybon: Treating Aisling Emigh/Extender: Beryle Flock Weeks in Treatment: 1 Active Problems Location of Pain Severity and Description of Pain Patient Has Paino Yes Site Locations Rate the pain. Current Pain Level: 3 Character of Pain Describe the Pain: Aching Pain Management and Medication Current Pain Management: Electronic Signature(s) Signed: 02/05/2022 4:16:20 PM By: Tommie Ard RN Entered By: Tommie Ard on 01/31/2022 15:03:34 -------------------------------------------------------------------------------- Patient/Caregiver Education Details Patient Name: Date of Service: [name], [name] DA Rexene Edison 10/26/2023andnbsp3:00 PM Medical Record Number: [id number] Patient Account Number: [id number] Date of Birth/Gender: Treating RN: 1960/10/04 (61 y.o. Kateri Mc Primary Care Physician: Theodis Shove Other Clinician: Referring Physician: Treating Physician/Extender: Vladimir Faster in Treatment: 1 Education Assessment Education Provided To: Patient BETUL, BRISKY ([id number]) [id number] Page 6 of 8 Education Topics Provided Wound Debridement: Methods: Explain/Verbal Responses: Reinforcements needed, State content correctly Electronic Signature(s) Signed: 02/05/2022 4:16:20 PM By: Tommie Ard RN Entered By: Tommie Ard on 01/31/2022 15:33:12 -------------------------------------------------------------------------------- Wound Assessment Details Patient Name: Date of Service: [name], [name] 01/31/2022 3:00 PM Medical Record Number: [id number] Patient Account Number: [id number] Date of Birth/Sex: Treating RN: 05-21-1960 (61 y.o. [name], [name] Primary Care Michelina Mexicano: Theodis Shove Other Clinician: Referring Chalese Peach: Treating Shamaine Mulkern/Extender: Beryle Flock Weeks in Treatment: 1 Wound Status Wound Number: 2 Primary T be determined o Etiology: Wound  Location: Right, Posterior Lower Leg Wound Status: Open Wounding Event: Insect Bite Comorbid Anemia, Sleep Apnea, Hypertension, Peripheral Venous Date Acquired: 11/21/2021 History: Disease Weeks Of Treatment: 1 Clustered Wound: No Photos Wound Measurements Length: (cm) 1 Width: (cm) 1.1 Depth: (cm) 0.3 Area: (cm) 0.864 Volume: (cm) 0.259 % Reduction in Area: -161.8% % Reduction in Volume: -161.6% Epithelialization: None Tunneling: No Undermining: Yes Starting Position (o'clock): 10 Ending Position (o'clock): 3 Maximum Distance: (cm) 0.5 Wound Description Classification: Full Thickness Without Exposed Suppor Wound Margin: Distinct, outline attached Exudate Amount: Medium Exudate Type: Serosanguineous Exudate Color: red, brown t Structures Foul Odor After Cleansing: No Slough/Fibrino Yes Wound Bed Granulation Amount: Small (1-33%) Exposed Structure Granulation Quality: Red Fascia Exposed: No Necrotic Amount: Large (67-100%) Fat Layer (Subcutaneous Tissue) Exposed: Yes Necrotic Quality: Adherent Slough Tendon Exposed: No BREASIA, KARGES ([id number]) [id number] Page 7 of 8 Muscle Exposed: No Joint Exposed: No Bone Exposed: No Periwound Skin Texture Texture Color No Abnormalities Noted: Yes No Abnormalities Noted: No Rubor: Yes Moisture No Abnormalities Noted: Yes Temperature / Pain Temperature: No Abnormality Treatment Notes Wound #2 (Lower Leg) Wound Laterality: Right, Posterior Cleanser Soap and Water Discharge Instruction: May shower and wash wound with dial antibacterial soap and water prior to dressing change. Wound Cleanser Discharge Instruction: Cleanse the wound with wound cleanser prior to applying a clean dressing using gauze sponges, not tissue or cotton balls. Peri-Wound Care Sween Lotion (Moisturizing lotion) Discharge Instruction: Apply moisturizing lotion as directed Topical Triamcinolone Discharge Instruction:  Apply Triamcinolone as directed Primary Dressing KerraCel Ag Gelling Fiber Dressing, 2x2 in (silver alginate) Discharge Instruction: Apply silver alginate to wound bed as instructed Secondary Dressing Woven Gauze Sponge, Non-Sterile 4x4 in Discharge Instruction: Apply over primary dressing as directed. Secured With Transpore Surgical Tape, 2x10 (in/yd) Discharge Instruction: Secure dressing with tape as directed. Compression Wrap ThreePress (3 layer compression wrap)  Discharge Instruction: Apply three layer compression as directed. Compression Stockings Add-Ons Electronic Signature(s) Signed: 02/05/2022 4:16:20 PM By: Tommie Ard RN Entered By: Tommie Ard on 01/31/2022 15:14:05 -------------------------------------------------------------------------------- Vitals Details Patient Name: Date of Service: [name], [name] 01/31/2022 3:00 PM Medical Record Number: [id number] Patient Account Number: [id number] Date of Birth/Sex: Treating RN: December 04, 1960 (61 y.o. Kateri Mc Primary Care Brodrick Curran: Theodis Shove Other Clinician: Referring Godric Lavell: Treating Graison Leinberger/Extender: Beryle Flock Weeks in Treatment: 1 Vital Signs Time Taken: 15:00 Temperature (F): 98.3 Pulse (bpm): 99 Yuhasz, Chanise H ([id number]) [id number] Page 8 of 8 Respiratory Rate (breaths/min): 20 Blood Pressure (mmHg): 180/86 Reference Range: 80 - 120 mg / dl Electronic Signature(s) Signed: 02/05/2022 4:16:20 PM By: Tommie Ard RN Entered By: Tommie Ard on 01/31/2022 15:03:15

## 2022-02-05 NOTE — Progress Notes (Signed)
ELVE, DOHRMAN ([id number]) [id number] Page 1 of 10 Visit Report for 01/31/2022 Chief Complaint Document Details Patient Name: Date of Service: [name], [name] [name] H. 01/31/2022 3:00 PM Medical Record Number: [id number] Patient Account Number: [id number] Date of Birth/Sex: Treating RN: October 09, 1960 (61 y.o. [name] Primary Care Provider: Micheline Rough Other Clinician: Referring Provider: Treating Provider/Extender: Campbell Stall Weeks in Treatment: 1 Information Obtained from: Patient Chief Complaint RLE ulcer Electronic Signature(s) Signed: 01/31/2022 3:43:36 PM By: Fredirick Maudlin MD FACS Entered By: Fredirick Maudlin on 01/31/2022 15:43:35 -------------------------------------------------------------------------------- Debridement Details Patient Name: Date of Service: [name], [name] [name] H. 01/31/2022 3:00 PM Medical Record Number: [id number] Patient Account Number: [id number] Date of Birth/Sex: Treating RN: 05/28/1960 (61 y.o. [name], [name] Primary Care Provider: Micheline Rough Other Clinician: Referring Provider: Treating Provider/Extender: Campbell Stall Weeks in Treatment: 1 Debridement Performed for Assessment: Wound #2 Right,Posterior Lower Leg Performed By: Physician Fredirick Maudlin, MD Debridement Type: Debridement Level of Consciousness (Pre-procedure): Awake and Alert Pre-procedure Verification/Time Out Yes - 15:29 Taken: Start Time: 15:30 Pain Control: Lidocaine 5% topical ointment T Area Debrided (L x W): otal 1 (cm) x 1.1 (cm) = 1.1 (cm) Tissue and other material debrided: Viable, Non-Viable, Slough, Subcutaneous, Slough Level: Skin/Subcutaneous Tissue Debridement Description: Excisional Instrument: Curette Bleeding: Minimum Hemostasis Achieved: Pressure Procedural Pain: 0 Post Procedural Pain: 0 Response to Treatment: Procedure was tolerated well Level of  Consciousness (Post- Awake and Alert procedure): Post Debridement Measurements of Total Wound Length: (cm) 1 Width: (cm) 1.1 Depth: (cm) 0.3 Volume: (cm) 0.259 Character of Wound/Ulcer Post Debridement: Requires Further Debridement Post Procedure Diagnosis Same as TRANG, HELL ([id number]) [id number] Page 2 of 10 Notes Scribed for Dr. Celine Ahr by [name], RN Electronic Signature(s) Signed: 02/01/2022 8:01:11 AM By: Fredirick Maudlin MD FACS Signed: 02/05/2022 4:16:20 PM By: [name] RN Entered By: [name] on 01/31/2022 15:31:54 -------------------------------------------------------------------------------- HPI Details Patient Name: Date of Service: [name], [name] [name] H. 01/31/2022 3:00 PM Medical Record Number: [id number] Patient Account Number: [id number] Date of Birth/Sex: Treating RN: Aug 30, 1960 (61 y.o. [name] Primary Care Provider: Micheline Rough Other Clinician: Referring Provider: Treating Provider/Extender: Campbell Stall Weeks in Treatment: 1 History of Present Illness HPI Description: 02/16/2020 upon evaluation today patient actually appears to be doing poorly in regard to her left medial lower extremity ulcer. This is actually an area that she tells me she has had intermittent issues with over the years although has been closed for some time she typically uses compression right now she has juxta lite compression wraps. With that being said she tells me that this nonetheless open several weeks/months ago and has been given her trouble since. She does have a history of chronic venous insufficiency she is seeing specialist for this in the past she has had an ablation as well as sclerotherapy. With that being said she also has hypertension chronically which is managed by her primary care provider. In general she seems to be worsening overall with regard to the wound and states  that she finally realized that she needed to come in and have somebody look at this and not continue to try to manage this on her own. No fevers, chills, nausea, vomiting, or diarrhea. 02/23/2020 on evaluation today patient appears to be doing well with regard to her wound. This is showing some signs of improvement which is great news still were not quite at the point where I would like to be as far as  the overall appearance of the wound is concerned but I do believe this is better than last week. I do believe the Iodoflex is helping as well. 03/08/2020 upon evaluation today patient appears to be doing well with regard to her wound. She has been tolerating the dressing changes without complication. Fortunately I feel like she has made great progress with the Iodoflex but I feel like it may be the point rest to switch to something else possibly a collagen- based dressing at this time. 03/15/2020 upon evaluation today patient appears to be doing excellent in regard to her leg ulcer. She has been tolerating the dressing changes without complication. Fortunately there is no signs of active infection. Overall she is measuring a little bit smaller today which is great news. 03/22/2020 upon evaluation today patient appears to be doing well with regard to her wound. She has been tolerating the dressing changes without complication. Fortunately there is no signs of active infection at this time. 03/28/2020; patient I do not usually see however she has a wound on the left anterior lower leg secondary to chronic venous insufficiency we have been using silver collagen under compression. She arrives in clinic with a nonviable surface requiring debridement 04/12/2020 upon evaluation today patient appears to be doing well all things considered with regard to her leg ulcer. She is tolerating the dressing changes without complication there is minimal dry skin around the edges of the wound that may be trapping and stopping  some of the events of the new skin I am can work on that today. Otherwise the surface of the wound appears to be doing excellent. 04/19/2020 upon evaluation today patient appears to be doing well with regard to her leg ulcer. She has been tolerating dressing changes without complication. Fortunately there is no signs of active infection at this time. No fever chills noted overall very pleased with how things seem to be progressing. 04/26/2020 on evaluation today patient appears to be doing well with regard to her wound currently. Is showing signs of excellent improvement overall is filling in nicely and there does not appear to be any signs of infection. No fevers, chills, nausea, vomiting, or diarrhea. 05/03/2020 upon evaluation today patient appears to be doing well with regard to her wound on the leg. This overall showing signs of good improvement which is great she has some good epithelial growth and overall I think that things are moving in the correct direction. We likewise going to continue with the wound care measures as before since she seems to making such good improvement. 2/3; venous wound on the left medial leg. This is contracting. We are using Prisma and 3 layer compression. She has a stocking and waiting in the eventuality this heals. She is already using it on the right 05/17/2020 upon evaluation today patient appears to be doing well with regard to her leg ulcer. She has been tolerating the dressing changes without complication. Fortunately there is no signs of active infection which is great news and overall very pleased with where things stand today. No fevers, chills, nausea, vomiting, or diarrhea. 05/24/2020 upon evaluation today patient appears to be doing well with regard to her wound. Overall I feel like she is making excellent progress. There does not appear to be any signs of active infection which is great news. 05/31/2020 upon evaluation today patient appears to be doing well  with regard to her wound. There does not appear to be any signs of active infection which is great news  overall I am extremely pleased with where things stand today. READMISSION 01/23/2022 She returns to clinic today with a new wound on her right posterior calf. She says that she was cleaning out an old shed near the middle of August this year and then noticed what seemed to be a bug bite on her right posterior calf. It was itchy, red, and raised. By the end of September, an ulcer had developed. She has been applying various topical creams such as hydrocortisone and others to the site. When it was not improving, she made an appointment in the wound care AHSOKA, VEVERKA ([id number]) [id number] Page 3 of 10 center. ABI in clinic today was 0.97. On her right posterior calf, there is a circular wound with necrotic fat and black eschar present. There is no purulent drainage or malodor. The periwound skin is in good condition with just a little induration that appears to be secondary to inflammation. 01/31/2022: The wound measures a little bit larger today, but overall is quite a bit cleaner. There is some undermining from 9:00 to 3:00. She is having some periwound itching and says that her wrap slid. Electronic Signature(s) Signed: 01/31/2022 3:44:31 PM By: Fredirick Maudlin MD FACS Entered By: Fredirick Maudlin on 01/31/2022 15:44:30 -------------------------------------------------------------------------------- Physical Exam Details Patient Name: Date of Service: [name], [name] [name] H. 01/31/2022 3:00 PM Medical Record Number: [id number] Patient Account Number: [id number] Date of Birth/Sex: Treating RN: 1961-03-22 (61 y.o. [name] Primary Care Provider: Micheline Rough Other Clinician: Referring Provider: Treating Provider/Extender: Campbell Stall Weeks in Treatment: 1 Constitutional Hypertensive, asymptomatic. . . . No acute  distress.Marland Kitchen Respiratory Normal work of breathing on room air.. Notes 01/31/2022: The wound measures a little bit larger today, but overall is quite a bit cleaner. There is some undermining from 9:00 to 3:00. She is having some periwound itching and says that her wrap slid. Electronic Signature(s) Signed: 01/31/2022 3:45:17 PM By: Fredirick Maudlin MD FACS Entered By: Fredirick Maudlin on 01/31/2022 15:45:16 -------------------------------------------------------------------------------- Physician Orders Details Patient Name: Date of Service: [name], [name] [name] H. 01/31/2022 3:00 PM Medical Record Number: [id number] Patient Account Number: [id number] Date of Birth/Sex: Treating RN: 1960-07-22 (61 y.o. [name] Primary Care Provider: Micheline Rough Other Clinician: Referring Provider: Treating Provider/Extender: Maye Hides in Treatment: 1 Verbal / Phone Orders: No Diagnosis Coding ICD-10 Coding Code Description [phone] Non-pressure chronic ulcer of right calf with fat layer exposed E66.01 Morbid (severe) obesity due to excess calories I10 Essential (primary) hypertension R60.0 Localized edema Follow-up Appointments ppointment in 1 week. - Dr. Celine Ahr - room 2 Return A Anesthetic (In clinic) Topical Lidocaine 5% applied to wound bed [name], [name] ([id number]) [id number] Page 4 of 10 Bathing/ Shower/ Hygiene May shower with protection but do not get wound dressing(s) wet. Edema Control - Lymphedema / SCD / Other Bilateral Lower Extremities Elevate legs to the level of the heart or above for 30 minutes daily and/or when sitting, a frequency of: - throughout the day Avoid standing for long periods of time. Exercise regularly Moisturize legs daily. Compression stocking or Garment 20-30 mm/Hg pressure to: - left leg daily. Apply first thing in the morning, remove at night. Wound Treatment Wound #2 - Lower Leg Wound  Laterality: Right, Posterior Cleanser: Soap and Water 1 x Per Week/30 Days Discharge Instructions: May shower and wash wound with dial antibacterial soap and water prior to dressing change. Cleanser: Wound Cleanser 1 x Per Week/30 Days Discharge Instructions: Cleanse the wound  with wound cleanser prior to applying a clean dressing using gauze sponges, not tissue or cotton balls. Peri-Wound Care: Sween Lotion (Moisturizing lotion) 1 x Per Week/30 Days Discharge Instructions: Apply moisturizing lotion as directed Topical: Triamcinolone 1 x Per Week/30 Days Discharge Instructions: Apply Triamcinolone as directed Prim Dressing: KerraCel Ag Gelling Fiber Dressing, 2x2 in (silver alginate) 1 x Per Week/30 Days ary Discharge Instructions: Apply silver alginate to wound bed as instructed Secondary Dressing: Woven Gauze Sponge, Non-Sterile 4x4 in 1 x Per Week/30 Days Discharge Instructions: Apply over primary dressing as directed. Secured With: Transpore Surgical Tape, 2x10 (in/yd) 1 x Per Week/30 Days Discharge Instructions: Secure dressing with tape as directed. Compression Wrap: ThreePress (3 layer compression wrap) 1 x Per Week/30 Days Discharge Instructions: Apply three layer compression as directed. Electronic Signature(s) Signed: 02/01/2022 8:01:11 AM By: Fredirick Maudlin MD FACS Entered By: Fredirick Maudlin on 01/31/2022 15:45:32 -------------------------------------------------------------------------------- Problem List Details Patient Name: Date of Service: [name], [name] [name] H. 01/31/2022 3:00 PM Medical Record Number: [id number] Patient Account Number: [id number] Date of Birth/Sex: Treating RN: 1961-03-08 (61 y.o. [name] Primary Care Provider: Micheline Rough Other Clinician: Referring Provider: Treating Provider/Extender: Campbell Stall Weeks in Treatment: 1 Active Problems ICD-10 Encounter Code Description Active Date MDM Diagnosis [phone]  Non-pressure chronic ulcer of right calf with fat layer exposed 01/23/2022 No Yes E66.01 Morbid (severe) obesity due to excess calories 01/23/2022 No Yes I10 Essential (primary) hypertension 01/23/2022 No Yes [name], [name] ([id number]) [id number] Page 5 of 10 R60.0 Localized edema 01/23/2022 No Yes Inactive Problems Resolved Problems Electronic Signature(s) Signed: 01/31/2022 3:43:22 PM By: Fredirick Maudlin MD FACS Entered By: Fredirick Maudlin on 01/31/2022 15:43:22 -------------------------------------------------------------------------------- Progress Note Details Patient Name: Date of Service: [name], [name] [name] H. 01/31/2022 3:00 PM Medical Record Number: [id number] Patient Account Number: [id number] Date of Birth/Sex: Treating RN: July 08, 1960 (61 y.o. [name] Primary Care Provider: Micheline Rough Other Clinician: Referring Provider: Treating Provider/Extender: Campbell Stall Weeks in Treatment: 1 Subjective Chief Complaint Information obtained from Patient RLE ulcer History of Present Illness (HPI) 02/16/2020 upon evaluation today patient actually appears to be doing poorly in regard to her left medial lower extremity ulcer. This is actually an area that she tells me she has had intermittent issues with over the years although has been closed for some time she typically uses compression right now she has juxta lite compression wraps. With that being said she tells me that this nonetheless open several weeks/months ago and has been given her trouble since. She does have a history of chronic venous insufficiency she is seeing specialist for this in the past she has had an ablation as well as sclerotherapy. With that being said she also has hypertension chronically which is managed by her primary care provider. In general she seems to be worsening overall with regard to the wound and states that she finally realized  that she needed to come in and have somebody look at this and not continue to try to manage this on her own. No fevers, chills, nausea, vomiting, or diarrhea. 02/23/2020 on evaluation today patient appears to be doing well with regard to her wound. This is showing some signs of improvement which is great news still were not quite at the point where I would like to be as far as the overall appearance of the wound is concerned but I do believe this is better than last week. I do believe the Iodoflex is helping as well. 03/08/2020  upon evaluation today patient appears to be doing well with regard to her wound. She has been tolerating the dressing changes without complication. Fortunately I feel like she has made great progress with the Iodoflex but I feel like it may be the point rest to switch to something else possibly a collagen- based dressing at this time. 03/15/2020 upon evaluation today patient appears to be doing excellent in regard to her leg ulcer. She has been tolerating the dressing changes without complication. Fortunately there is no signs of active infection. Overall she is measuring a little bit smaller today which is great news. 03/22/2020 upon evaluation today patient appears to be doing well with regard to her wound. She has been tolerating the dressing changes without complication. Fortunately there is no signs of active infection at this time. 03/28/2020; patient I do not usually see however she has a wound on the left anterior lower leg secondary to chronic venous insufficiency we have been using silver collagen under compression. She arrives in clinic with a nonviable surface requiring debridement 04/12/2020 upon evaluation today patient appears to be doing well all things considered with regard to her leg ulcer. She is tolerating the dressing changes without complication there is minimal dry skin around the edges of the wound that may be trapping and stopping some of the events of the  new skin I am can work on that today. Otherwise the surface of the wound appears to be doing excellent. 04/19/2020 upon evaluation today patient appears to be doing well with regard to her leg ulcer. She has been tolerating dressing changes without complication. Fortunately there is no signs of active infection at this time. No fever chills noted overall very pleased with how things seem to be progressing. 04/26/2020 on evaluation today patient appears to be doing well with regard to her wound currently. Is showing signs of excellent improvement overall is filling in nicely and there does not appear to be any signs of infection. No fevers, chills, nausea, vomiting, or diarrhea. 05/03/2020 upon evaluation today patient appears to be doing well with regard to her wound on the leg. This overall showing signs of good improvement which is great she has some good epithelial growth and overall I think that things are moving in the correct direction. We likewise going to continue with the wound care measures as before since she seems to making such good improvement. 2/3; venous wound on the left medial leg. This is contracting. We are using Prisma and 3 layer compression. She has a stocking and waiting in the eventuality this heals. She is already using it on the right 05/17/2020 upon evaluation today patient appears to be doing well with regard to her leg ulcer. She has been tolerating the dressing changes without complication. Fortunately there is no signs of active infection which is great news and overall very pleased with where things stand today. No fevers, chills, nausea, vomiting, or diarrhea. [name], [name] ([id number]) [id number] Page 6 of 10 05/24/2020 upon evaluation today patient appears to be doing well with regard to her wound. Overall I feel like she is making excellent progress. There does not appear to be any signs of active infection which is great news. 05/31/2020  upon evaluation today patient appears to be doing well with regard to her wound. There does not appear to be any signs of active infection which is great news overall I am extremely pleased with where things stand today. READMISSION 01/23/2022 She returns to clinic today with a  new wound on her right posterior calf. She says that she was cleaning out an old shed near the middle of August this year and then noticed what seemed to be a bug bite on her right posterior calf. It was itchy, red, and raised. By the end of September, an ulcer had developed. She has been applying various topical creams such as hydrocortisone and others to the site. When it was not improving, she made an appointment in the wound care center. ABI in clinic today was 0.97. On her right posterior calf, there is a circular wound with necrotic fat and black eschar present. There is no purulent drainage or malodor. The periwound skin is in good condition with just a little induration that appears to be secondary to inflammation. 01/31/2022: The wound measures a little bit larger today, but overall is quite a bit cleaner. There is some undermining from 9:00 to 3:00. She is having some periwound itching and says that her wrap slid. Patient History Information obtained from Patient. Family History Cancer - Mother, Diabetes - Father, Hypertension - Mother, Kidney Disease - Mother, Lung Disease - Father, Thyroid Problems - Paternal Grandparents, No family history of Heart Disease, Seizures, Stroke, Tuberculosis. Social History Never smoker, Marital Status - Married, Alcohol Use - Never, Drug Use - No History, Caffeine Use - Daily - coffee. Medical History Eyes Denies history of Cataracts, Glaucoma, Optic Neuritis Ear/Nose/Mouth/Throat Denies history of Chronic sinus problems/congestion, Middle ear problems Hematologic/Lymphatic Patient has history of Anemia - iron Denies history of Hemophilia, Human Immunodeficiency Virus,  Lymphedema, Sickle Cell Disease Respiratory Patient has history of Sleep Apnea - CPAP Denies history of Aspiration, Asthma, Chronic Obstructive Pulmonary Disease (COPD), Pneumothorax, Tuberculosis Cardiovascular Patient has history of Hypertension, Peripheral Venous Disease Denies history of Angina, Arrhythmia, Congestive Heart Failure, Coronary Artery Disease, Deep Vein Thrombosis, Hypotension, Myocardial Infarction, Peripheral Arterial Disease, Phlebitis, Vasculitis Gastrointestinal Denies history of Cirrhosis , Colitis, Crohnoos, Hepatitis A, Hepatitis B, Hepatitis C Endocrine Denies history of Type I Diabetes, Type II Diabetes Genitourinary Denies history of End Stage Renal Disease Immunological Denies history of Lupus Erythematosus, Raynaudoos, Scleroderma Integumentary (Skin) Denies history of History of Burn Musculoskeletal Denies history of Gout, Rheumatoid Arthritis, Osteoarthritis, Osteomyelitis Neurologic Denies history of Dementia, Neuropathy, Quadriplegia, Paraplegia, Seizure Disorder Oncologic Denies history of Received Chemotherapy, Received Radiation Psychiatric Denies history of Anorexia/bulimia, Confinement Anxiety Hospitalization/Surgery History - cholecystectomy 1980s. - nephrolithasis 1980s. - plate and rod in right elbow surgery 2010. 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 No acute distress.. Vitals Time Taken: 3:00 PM, Temperature: 98.3 F, Pulse: 99 bpm, Respiratory Rate: 20 breaths/min, Blood Pressure: 180/86 mmHg. Respiratory Normal work of breathing on room air.[name], [name] ([id number]) [id number] Page 7 of 10 General Notes: 01/31/2022: The wound measures a little bit larger today, but overall is quite a bit cleaner. There is some undermining from 9:00 to 3:00. She is having some  periwound itching and says that her wrap slid. Integumentary (Hair, Skin) Wound #2 status is Open. Original cause of wound was Insect Bite. The date acquired was: 11/21/2021. The wound has been in treatment 1 weeks. The wound is located on the Right,Posterior Lower Leg. The wound measures 1cm length x 1.1cm width x 0.3cm depth; 0.864cm^2 area and 0.259cm^3 volume. There is Fat Layer (Subcutaneous Tissue) exposed. There is no tunneling noted, however, there is undermining starting at 10:00 and ending at 3:00 with a  maximum distance of 0.5cm. There is a medium amount of serosanguineous drainage noted. The wound margin is distinct with the outline attached to the wound base. There is small (1-33%) red granulation within the wound bed. There is a large (67-100%) amount of necrotic tissue within the wound bed including Adherent Slough. The periwound skin appearance had no abnormalities noted for texture. The periwound skin appearance had no abnormalities noted for moisture. The periwound skin appearance exhibited: Rubor. Periwound temperature was noted as No Abnormality. Assessment Active Problems ICD-10 Non-pressure chronic ulcer of right calf with fat layer exposed Morbid (severe) obesity due to excess calories Essential (primary) hypertension Localized edema Procedures Wound #2 Pre-procedure diagnosis of Wound #2 is a T be determined located on the Right,Posterior Lower Leg . There was a Excisional Skin/Subcutaneous Tissue o Debridement with a total area of 1.1 sq cm performed by Fredirick Maudlin, MD. With the following instrument(s): Curette to remove Viable and Non-Viable tissue/material. Material removed includes Subcutaneous Tissue and Slough and after achieving pain control using Lidocaine 5% topical ointment. No specimens were taken. A time out was conducted at 15:29, prior to the start of the procedure. A Minimum amount of bleeding was controlled with Pressure. The procedure was tolerated  well with a pain level of 0 throughout and a pain level of 0 following the procedure. Post Debridement Measurements: 1cm length x 1.1cm width x 0.3cm depth; 0.259cm^3 volume. Character of Wound/Ulcer Post Debridement requires further debridement. Post procedure Diagnosis Wound #2: Same as Pre-Procedure General Notes: Scribed for Dr. Celine Ahr by [name], RN. Pre-procedure diagnosis of Wound #2 is a T be determined located on the Right,Posterior Lower Leg . There was a Three Layer Compression Therapy o Procedure by [name], RN. Post procedure Diagnosis Wound #2: Same as Pre-Procedure Plan Follow-up Appointments: Return Appointment in 1 week. - Dr. Celine Ahr - room 2 Anesthetic: (In clinic) Topical Lidocaine 5% applied to wound bed Bathing/ Shower/ Hygiene: May shower with protection but do not get wound dressing(s) wet. Edema Control - Lymphedema / SCD / Other: Elevate legs to the level of the heart or above for 30 minutes daily and/or when sitting, a frequency of: - throughout the day Avoid standing for long periods of time. Exercise regularly Moisturize legs daily. Compression stocking or Garment 20-30 mm/Hg pressure to: - left leg daily. Apply first thing in the morning, remove at night. WOUND #2: - Lower Leg Wound Laterality: Right, Posterior Cleanser: Soap and Water 1 x Per Week/30 Days Discharge Instructions: May shower and wash wound with dial antibacterial soap and water prior to dressing change. Cleanser: Wound Cleanser 1 x Per Week/30 Days Discharge Instructions: Cleanse the wound with wound cleanser prior to applying a clean dressing using gauze sponges, not tissue or cotton balls. Peri-Wound Care: Sween Lotion (Moisturizing lotion) 1 x Per Week/30 Days Discharge Instructions: Apply moisturizing lotion as directed Topical: Triamcinolone 1 x Per Week/30 Days Discharge Instructions: Apply Triamcinolone as directed Prim Dressing: KerraCel Ag Gelling Fiber Dressing, 2x2 in  (silver alginate) 1 x Per Week/30 Days ary Discharge Instructions: Apply silver alginate to wound bed as instructed Secondary Dressing: Woven Gauze Sponge, Non-Sterile 4x4 in 1 x Per Week/30 Days Discharge Instructions: Apply over primary dressing as directed. Secured With: Transpore Surgical T ape, 2x10 (in/yd) 1 x Per Week/30 Days Discharge Instructions: Secure dressing with tape as directed. Com pression Wrap: ThreePress (3 layer compression wrap) 1 x Per Week/30 Days Discharge Instructions: Apply three layer compression as directed. [name], [name] ([id number])  [id number] Page 8 of 10 01/31/2022: The wound measures a little bit larger today, but overall is quite a bit cleaner. There is some undermining from 9:00 to 3:00. She is having some periwound itching and says that her wrap slid. I used a curette to debride slough and nonviable subcutaneous tissue from her wound. We will continue to use the silver alginate with 3 layer compression. We will also apply periwound triamcinolone to try and reduce the inflammation and irritation. We will use the first layer of Unna boot at the top of her wrap to try and keep it from sliding. Follow-up in 1 week. Electronic Signature(s) Signed: 01/31/2022 3:46:18 PM By: Fredirick Maudlin MD FACS Entered By: Fredirick Maudlin on 01/31/2022 15:46:18 -------------------------------------------------------------------------------- HxROS Details Patient Name: Date of Service: [name], [name] [name] H. 01/31/2022 3:00 PM Medical Record Number: [id number] Patient Account Number: [id number] Date of Birth/Sex: Treating RN: 01-Sep-1960 (61 y.o. [name] Primary Care Provider: Micheline Rough Other Clinician: Referring Provider: Treating Provider/Extender: Campbell Stall Weeks in Treatment: 1 Information Obtained From Patient Eyes Medical History: Negative for: Cataracts; Glaucoma; Optic  Neuritis Ear/Nose/Mouth/Throat Medical History: Negative for: Chronic sinus problems/congestion; Middle ear problems Hematologic/Lymphatic Medical History: Positive for: Anemia - iron Negative for: Hemophilia; Human Immunodeficiency Virus; Lymphedema; Sickle Cell Disease Respiratory Medical History: Positive for: Sleep Apnea - CPAP Negative for: Aspiration; Asthma; Chronic Obstructive Pulmonary Disease (COPD); Pneumothorax; Tuberculosis Cardiovascular Medical History: Positive for: Hypertension; Peripheral Venous Disease Negative for: Angina; Arrhythmia; Congestive Heart Failure; Coronary Artery Disease; Deep Vein Thrombosis; Hypotension; Myocardial Infarction; Peripheral Arterial Disease; Phlebitis; Vasculitis Gastrointestinal Medical History: Negative for: Cirrhosis ; Colitis; Crohns; Hepatitis A; Hepatitis B; Hepatitis C Endocrine Medical History: Negative for: Type I Diabetes; Type II Diabetes Genitourinary Medical History: Negative for: End Stage Renal Disease Past Medical History Notes: years ago kidney stones CATLYNN, GRONDAHL ([id number]) [id number] Page 9 of 10 Immunological Medical History: Negative for: Lupus Erythematosus; Raynauds; Scleroderma Integumentary (Skin) Medical History: Negative for: History of Burn Musculoskeletal Medical History: Negative for: Gout; Rheumatoid Arthritis; Osteoarthritis; Osteomyelitis Neurologic Medical History: Negative for: Dementia; Neuropathy; Quadriplegia; Paraplegia; Seizure Disorder Oncologic Medical History: Negative for: Received Chemotherapy; Received Radiation Past Medical History Notes: skin Ca removed from back years ago Psychiatric Medical History: Negative for: Anorexia/bulimia; Confinement Anxiety Immunizations Pneumococcal Vaccine: Received Pneumococcal Vaccination: No Implantable Devices None Hospitalization / Surgery History Type of Hospitalization/Surgery cholecystectomy  1980s nephrolithasis 1980s plate and rod in right elbow surgery 2010 Family and Social History Cancer: Yes - Mother; Diabetes: Yes - Father; Heart Disease: No; Hypertension: Yes - Mother; Kidney Disease: Yes - Mother; Lung Disease: Yes - Father; Seizures: No; Stroke: No; Thyroid Problems: Yes - Paternal Grandparents; Tuberculosis: No; Never smoker; Marital Status - Married; Alcohol Use: Never; Drug Use: No History; Caffeine Use: Daily - coffee; Financial Concerns: No; Food, Clothing or Shelter Needs: No; Support System Lacking: No; Transportation Concerns: No Electronic Signature(s) Signed: 01/31/2022 4:24:03 PM By: Adline Peals Signed: 02/01/2022 8:01:11 AM By: Fredirick Maudlin MD FACS Entered By: Fredirick Maudlin on 01/31/2022 15:44:55 -------------------------------------------------------------------------------- SuperBill Details Patient Name: Date of Service: [address], [place]. 01/31/2022 Medical Record Number: [id number] Patient Account Number: [id number] Date of Birth/Sex: Treating RN: Apr 20, 1960 (61 y.o. [name] Primary Care Provider: Micheline Rough Other Clinician: Referring Provider: Treating Provider/Extender: Campbell Stall Weeks in Treatment: 1 [name], [name] ([id number]) [id number] Page 10 of 10 Diagnosis Coding ICD-10 Codes Code Description [id number] Non-pressure chronic ulcer of right calf with fat layer exposed E66.01  Morbid (severe) obesity due to excess calories I10 Essential (primary) hypertension R60.0 Localized edema Facility Procedures : CPT4 Code: [id number] Description: [id number] - DEB SUBQ TISSUE 20 SQ CM/< ICD-10 Diagnosis Description L97.212 Non-pressure chronic ulcer of right calf with fat layer exposed Modifier: Quantity: 1 Physician Procedures : CPT4 Code Description Modifier [id number] - WC PHYS LEVEL 4 - EST PT 25 ICD-10 Diagnosis Description L97.212 Non-pressure chronic ulcer  of right calf with fat layer exposed I10 Essential (primary) hypertension R60.0 Localized edema E66.01 Morbid  (severe) obesity due to excess calories Quantity: 1 : [id number] - WC PHYS SUBQ TISS 20 SQ CM ICD-10 Diagnosis Description L97.212 Non-pressure chronic ulcer of right calf with fat layer exposed Quantity: 1 Electronic Signature(s) Signed: 01/31/2022 3:46:36 PM By: Fredirick Maudlin MD FACS Entered By: Fredirick Maudlin on 01/31/2022 15:46:36

## 2022-02-08 ENCOUNTER — Encounter (HOSPITAL_BASED_OUTPATIENT_CLINIC_OR_DEPARTMENT_OTHER): Payer: BC Managed Care – PPO | Attending: General Surgery | Admitting: General Surgery

## 2022-02-08 DIAGNOSIS — G4730 Sleep apnea, unspecified: Secondary | ICD-10-CM | POA: Diagnosis not present

## 2022-02-08 DIAGNOSIS — L97212 Non-pressure chronic ulcer of right calf with fat layer exposed: Secondary | ICD-10-CM | POA: Diagnosis not present

## 2022-02-08 DIAGNOSIS — R600 Localized edema: Secondary | ICD-10-CM | POA: Diagnosis not present

## 2022-02-08 DIAGNOSIS — I10 Essential (primary) hypertension: Secondary | ICD-10-CM | POA: Diagnosis not present

## 2022-02-11 NOTE — Progress Notes (Signed)
ALIS, SAWCHUK ([id number]) [id number] Page 1 of 10 Visit Report for 02/08/2022 Chief Complaint Document Details Patient Name: Date of Service: [name] 02/08/2022 3:15 PM Medical Record Number: [id number] Patient Account Number: [id number] Date of Birth/Sex: Treating RN: 26-Oct-1960 (61 y.o. F) Primary Care Provider: Theodis Shove Other Clinician: Referring Provider: Treating Provider/Extender: Beryle Flock Weeks in Treatment: 2 Information Obtained from: Patient Chief Complaint RLE ulcer Electronic Signature(s) Signed: 02/08/2022 4:08:07 PM By: [name] MD FACS Entered By: [name] on 02/08/2022 16:08:07 -------------------------------------------------------------------------------- Debridement Details Patient Name: Date of Service: [name], [name] 02/08/2022 3:15 PM Medical Record Number: [id number] Patient Account Number: [id number] Date of Birth/Sex: Treating RN: 02-16-1961 (61 y.o. Kateri Mc Primary Care Provider: Theodis Shove Other Clinician: Referring Provider: Treating Provider/Extender: Beryle Flock Weeks in Treatment: 2 Debridement Performed for Assessment: Wound #2 Right,Posterior Lower Leg Performed By: Physician [name], MD Debridement Type: Debridement Level of Consciousness (Pre-procedure): Awake and Alert Pre-procedure Verification/Time Out Yes - 15:52 Taken: Start Time: 15:53 Pain Control: Lidocaine 4% T opical Solution T Area Debrided (L x W): otal 1.4 (cm) x 1.5 (cm) = 2.1 (cm) Tissue and other material debrided: Viable, Non-Viable, Slough, Subcutaneous, Slough Level: Skin/Subcutaneous Tissue Debridement Description: Excisional Instrument: Curette Bleeding: Minimum Response to Treatment: Procedure was tolerated well Level of Consciousness (Post- Awake and Alert procedure): Post Debridement Measurements of Total  Wound Length: (cm) 1.4 Width: (cm) 1.5 Depth: (cm) 0.8 Volume: (cm) 1.319 Character of Wound/Ulcer Post Debridement: Requires Further Debridement Post Procedure Diagnosis Same as Pre-procedure Notes SHANYIAH, CONDE ([id number]) [id number] Page 2 of 10 Scribed for Dr. Lady Gary by Tommie Ard, RN Electronic Signature(s) Signed: 02/08/2022 4:27:08 PM By: [name] MD FACS Signed: 02/11/2022 4:40:13 PM By: Tommie Ard RN Entered By: Tommie Ard on 02/08/2022 15:54:12 -------------------------------------------------------------------------------- HPI Details Patient Name: Date of Service: [name], [name] 02/08/2022 3:15 PM Medical Record Number: [id number] Patient Account Number: [id number] Date of Birth/Sex: Treating RN: 11-19-1960 (61 y.o. F) Primary Care Provider: Theodis Shove Other Clinician: Referring Provider: Treating Provider/Extender: Beryle Flock Weeks in Treatment: 2 History of Present Illness HPI Description: 02/16/2020 upon evaluation today patient actually appears to be doing poorly in regard to her left medial lower extremity ulcer. This is actually an area that she tells me she has had intermittent issues with over the years although has been closed for some time she typically uses compression right now she has juxta lite compression wraps. With that being said she tells me that this nonetheless open several weeks/months ago and has been given her trouble since. She does have a history of chronic venous insufficiency she is seeing specialist for this in the past she has had an ablation as well as sclerotherapy. With that being said she also has hypertension chronically which is managed by her primary care provider. In general she seems to be worsening overall with regard to the wound and states that she finally realized that she needed to come in and have somebody look at this and not continue to try to  manage this on her own. No fevers, chills, nausea, vomiting, or diarrhea. 02/23/2020 on evaluation today patient appears to be doing well with regard to her wound. This is showing some signs of improvement which is great news still were not quite at the point where I would like to be as far as the overall appearance of the wound is concerned but I do believe this  is better than last week. I do believe the Iodoflex is helping as well. 03/08/2020 upon evaluation today patient appears to be doing well with regard to her wound. She has been tolerating the dressing changes without complication. Fortunately I feel like she has made great progress with the Iodoflex but I feel like it may be the point rest to switch to something else possibly a collagen- based dressing at this time. 03/15/2020 upon evaluation today patient appears to be doing excellent in regard to her leg ulcer. She has been tolerating the dressing changes without complication. Fortunately there is no signs of active infection. Overall she is measuring a little bit smaller today which is great news. 03/22/2020 upon evaluation today patient appears to be doing well with regard to her wound. She has been tolerating the dressing changes without complication. Fortunately there is no signs of active infection at this time. 03/28/2020; patient I do not usually see however she has a wound on the left anterior lower leg secondary to chronic venous insufficiency we have been using silver collagen under compression. She arrives in clinic with a nonviable surface requiring debridement 04/12/2020 upon evaluation today patient appears to be doing well all things considered with regard to her leg ulcer. She is tolerating the dressing changes without complication there is minimal dry skin around the edges of the wound that may be trapping and stopping some of the events of the new skin I am can work on that today. Otherwise the surface of the wound appears to  be doing excellent. 04/19/2020 upon evaluation today patient appears to be doing well with regard to her leg ulcer. She has been tolerating dressing changes without complication. Fortunately there is no signs of active infection at this time. No fever chills noted overall very pleased with how things seem to be progressing. 04/26/2020 on evaluation today patient appears to be doing well with regard to her wound currently. Is showing signs of excellent improvement overall is filling in nicely and there does not appear to be any signs of infection. No fevers, chills, nausea, vomiting, or diarrhea. 05/03/2020 upon evaluation today patient appears to be doing well with regard to her wound on the leg. This overall showing signs of good improvement which is great she has some good epithelial growth and overall I think that things are moving in the correct direction. We likewise going to continue with the wound care measures as before since she seems to making such good improvement. 2/3; venous wound on the left medial leg. This is contracting. We are using Prisma and 3 layer compression. She has a stocking and waiting in the eventuality this heals. She is already using it on the right 05/17/2020 upon evaluation today patient appears to be doing well with regard to her leg ulcer. She has been tolerating the dressing changes without complication. Fortunately there is no signs of active infection which is great news and overall very pleased with where things stand today. No fevers, chills, nausea, vomiting, or diarrhea. 05/24/2020 upon evaluation today patient appears to be doing well with regard to her wound. Overall I feel like she is making excellent progress. There does not appear to be any signs of active infection which is great news. 05/31/2020 upon evaluation today patient appears to be doing well with regard to her wound. There does not appear to be any signs of active infection which is great news overall  I am extremely pleased with where things stand today. READMISSION 01/23/2022 She  returns to clinic today with a new wound on her right posterior calf. She says that she was cleaning out an old shed near the middle of August this year and then noticed what seemed to be a bug bite on her right posterior calf. It was itchy, red, and raised. By the end of September, an ulcer had developed. She has been applying various topical creams such as hydrocortisone and others to the site. When it was not improving, she made an appointment in the wound care center. ABI in clinic today was 0.97. On her right posterior calf, there is a circular wound with necrotic fat and black eschar present. There is no purulent drainage or malodor. The periwound skin is in good condition with just a little induration that appears to be secondary to inflammation. 01/31/2022: The wound measures a little bit larger today, but overall is quite a bit cleaner. There is some undermining from 9:00 to 3:00. She is having some JANASIA, COVERDALE ([id number]) [id number] Page 3 of 10 periwound itching and says that her wrap slid. 02/08/2022: Despite using an Unna boot first layer at the top of her wrap, it slid again and it looks like there is been some bruising at the wound site. The wound is about the same size in terms of dimensions. There is a fair amount of slough and nonviable tissue still present. Edema control is better than last week. Electronic Signature(s) Signed: 02/08/2022 4:09:03 PM By: [name] MD FACS Entered By: [name] on 02/08/2022 16:09:03 -------------------------------------------------------------------------------- Physical Exam Details Patient Name: Date of Service: [name], [name] 02/08/2022 3:15 PM Medical Record Number: [id number] Patient Account Number: [id number] Date of Birth/Sex: Treating RN: 05-26-1960 (61 y.o. F) Primary Care Provider: Theodis Shove  Other Clinician: Referring Provider: Treating Provider/Extender: Beryle Flock Weeks in Treatment: 2 Constitutional Hypertensive, asymptomatic. Slightly tachycardic, asymptomatic. . . No acute distress.Marland Kitchen Respiratory Normal work of breathing on room air.. Notes 02/08/2022: It looks like there is been some bruising at the wound site. The wound is about the same size in terms of dimensions. There is a fair amount of slough and nonviable tissue still present. Edema control is better than last week. Electronic Signature(s) Signed: 02/08/2022 4:09:42 PM By: [name] MD FACS Entered By: [name] on 02/08/2022 16:09:42 -------------------------------------------------------------------------------- Physician Orders Details Patient Name: Date of Service: [address], [name] 02/08/2022 3:15 PM Medical Record Number: [id number] Patient Account Number: [id number] Date of Birth/Sex: Treating RN: March 08, 1961 (61 y.o. Kateri Mc Primary Care Provider: Theodis Shove Other Clinician: Referring Provider: Treating Provider/Extender: Vladimir Faster in Treatment: 2 Verbal / Phone Orders: No Diagnosis Coding ICD-10 Coding Code Description [phone] Non-pressure chronic ulcer of right calf with fat layer exposed E66.01 Morbid (severe) obesity due to excess calories I10 Essential (primary) hypertension R60.0 Localized edema Follow-up Appointments ppointment in 1 week. - Dr. Lady Gary - room 4 Return A Anesthetic (In clinic) Topical Lidocaine 5% applied to wound bed NASIA, CANNAN ([id number]) [id number] Page 4 of 10 Bathing/ Shower/ Hygiene May shower with protection but do not get wound dressing(s) wet. Edema Control - Lymphedema / SCD / Other Bilateral Lower Extremities Elevate legs to the level of the heart or above for 30 minutes daily and/or when sitting, a frequency of: - throughout the day Avoid  standing for long periods of time. Exercise regularly Moisturize legs daily. Compression stocking or Garment 20-30 mm/Hg pressure to: - left leg daily. Apply first thing in the  morning, remove at night. Wound Treatment Wound #2 - Lower Leg Wound Laterality: Right, Posterior Cleanser: Soap and Water 1 x Per Week/30 Days Discharge Instructions: May shower and wash wound with dial antibacterial soap and water prior to dressing change. Cleanser: Wound Cleanser 1 x Per Week/30 Days Discharge Instructions: Cleanse the wound with wound cleanser prior to applying a clean dressing using gauze sponges, not tissue or cotton balls. Peri-Wound Care: Sween Lotion (Moisturizing lotion) 1 x Per Week/30 Days Discharge Instructions: Apply moisturizing lotion as directed Topical: Triamcinolone 1 x Per Week/30 Days Discharge Instructions: Apply Triamcinolone as directed Prim Dressing: KerraCel Ag Gelling Fiber Dressing, 2x2 in (silver alginate) 1 x Per Week/30 Days ary Discharge Instructions: Apply silver alginate to wound bed as instructed Secondary Dressing: Woven Gauze Sponge, Non-Sterile 4x4 in 1 x Per Week/30 Days Discharge Instructions: Apply over primary dressing as directed. Secured With: Transpore Surgical Tape, 2x10 (in/yd) 1 x Per Week/30 Days Discharge Instructions: Secure dressing with tape as directed. Compression Wrap: ThreePress (3 layer compression wrap) 1 x Per Week/30 Days Discharge Instructions: Apply three layer compression as directed. Electronic Signature(s) Signed: 02/08/2022 4:27:08 PM By: [name] MD FACS Entered By: [name] on 02/08/2022 16:10:25 -------------------------------------------------------------------------------- Problem List Details Patient Name: Date of Service: [address] [name] 02/08/2022 3:15 PM Medical Record Number: [id number] Patient Account Number: [id number] Date of Birth/Sex: Treating RN: 03/19/1961 (61 y.o. F) Primary Care Provider:  Theodis Shove Other Clinician: Referring Provider: Treating Provider/Extender: Beryle Flock Weeks in Treatment: 2 Active Problems ICD-10 Encounter Code Description Active Date MDM Diagnosis L97.212 Non-pressure chronic ulcer of right calf with fat layer exposed 01/23/2022 No Yes E66.01 Morbid (severe) obesity due to excess calories 01/23/2022 No Yes I10 Essential (primary) hypertension 01/23/2022 No Yes NATHANIA, WALDMAN ([id number]) [id number] Page 5 of 10 R60.0 Localized edema 01/23/2022 No Yes Inactive Problems Resolved Problems Electronic Signature(s) Signed: 02/08/2022 4:07:47 PM By: [name] MD FACS Entered By: [name] on 02/08/2022 16:07:47 -------------------------------------------------------------------------------- Progress Note Details Patient Name: Date of Service: [name], [name] 02/08/2022 3:15 PM Medical Record Number: [id number] Patient Account Number: [id number] Date of Birth/Sex: Treating RN: 07-15-1960 (61 y.o. F) Primary Care Provider: Theodis Shove Other Clinician: Referring Provider: Treating Provider/Extender: Beryle Flock Weeks in Treatment: 2 Subjective Chief Complaint Information obtained from Patient RLE ulcer History of Present Illness (HPI) 02/16/2020 upon evaluation today patient actually appears to be doing poorly in regard to her left medial lower extremity ulcer. This is actually an area that she tells me she has had intermittent issues with over the years although has been closed for some time she typically uses compression right now she has juxta lite compression wraps. With that being said she tells me that this nonetheless open several weeks/months ago and has been given her trouble since. She does have a history of chronic venous insufficiency she is seeing specialist for this in the past she has had an ablation as well as sclerotherapy.  With that being said she also has hypertension chronically which is managed by her primary care provider. In general she seems to be worsening overall with regard to the wound and states that she finally realized that she needed to come in and have somebody look at this and not continue to try to manage this on her own. No fevers, chills, nausea, vomiting, or diarrhea. 02/23/2020 on evaluation today patient appears to be doing well with regard to her wound. This is showing some signs  of improvement which is great news still were not quite at the point where I would like to be as far as the overall appearance of the wound is concerned but I do believe this is better than last week. I do believe the Iodoflex is helping as well. 03/08/2020 upon evaluation today patient appears to be doing well with regard to her wound. She has been tolerating the dressing changes without complication. Fortunately I feel like she has made great progress with the Iodoflex but I feel like it may be the point rest to switch to something else possibly a collagen- based dressing at this time. 03/15/2020 upon evaluation today patient appears to be doing excellent in regard to her leg ulcer. She has been tolerating the dressing changes without complication. Fortunately there is no signs of active infection. Overall she is measuring a little bit smaller today which is great news. 03/22/2020 upon evaluation today patient appears to be doing well with regard to her wound. She has been tolerating the dressing changes without complication. Fortunately there is no signs of active infection at this time. 03/28/2020; patient I do not usually see however she has a wound on the left anterior lower leg secondary to chronic venous insufficiency we have been using silver collagen under compression. She arrives in clinic with a nonviable surface requiring debridement 04/12/2020 upon evaluation today patient appears to be doing well all things  considered with regard to her leg ulcer. She is tolerating the dressing changes without complication there is minimal dry skin around the edges of the wound that may be trapping and stopping some of the events of the new skin I am can work on that today. Otherwise the surface of the wound appears to be doing excellent. 04/19/2020 upon evaluation today patient appears to be doing well with regard to her leg ulcer. She has been tolerating dressing changes without complication. Fortunately there is no signs of active infection at this time. No fever chills noted overall very pleased with how things seem to be progressing. 04/26/2020 on evaluation today patient appears to be doing well with regard to her wound currently. Is showing signs of excellent improvement overall is filling in nicely and there does not appear to be any signs of infection. No fevers, chills, nausea, vomiting, or diarrhea. 05/03/2020 upon evaluation today patient appears to be doing well with regard to her wound on the leg. This overall showing signs of good improvement which is great she has some good epithelial growth and overall I think that things are moving in the correct direction. We likewise going to continue with the wound care measures as before since she seems to making such good improvement. 2/3; venous wound on the left medial leg. This is contracting. We are using Prisma and 3 layer compression. She has a stocking and waiting in the eventuality this heals. She is already using it on the right 05/17/2020 upon evaluation today patient appears to be doing well with regard to her leg ulcer. She has been tolerating the dressing changes without complication. Fortunately there is no signs of active infection which is great news and overall very pleased with where things stand today. No fevers, chills, nausea, vomiting, or diarrhea. KEBRINA, FRIEND ([id number]) [id number] Page 6 of 10 05/24/2020 upon  evaluation today patient appears to be doing well with regard to her wound. Overall I feel like she is making excellent progress. There does not appear to be any signs of active infection which is great  news. 05/31/2020 upon evaluation today patient appears to be doing well with regard to her wound. There does not appear to be any signs of active infection which is great news overall I am extremely pleased with where things stand today. READMISSION 01/23/2022 She returns to clinic today with a new wound on her right posterior calf. She says that she was cleaning out an old shed near the middle of August this year and then noticed what seemed to be a bug bite on her right posterior calf. It was itchy, red, and raised. By the end of September, an ulcer had developed. She has been applying various topical creams such as hydrocortisone and others to the site. When it was not improving, she made an appointment in the wound care center. ABI in clinic today was 0.97. On her right posterior calf, there is a circular wound with necrotic fat and black eschar present. There is no purulent drainage or malodor. The periwound skin is in good condition with just a little induration that appears to be secondary to inflammation. 01/31/2022: The wound measures a little bit larger today, but overall is quite a bit cleaner. There is some undermining from 9:00 to 3:00. She is having some periwound itching and says that her wrap slid. 02/08/2022: Despite using an Unna boot first layer at the top of her wrap, it slid again and it looks like there is been some bruising at the wound site. The wound is about the same size in terms of dimensions. There is a fair amount of slough and nonviable tissue still present. Edema control is better than last week. Patient History Information obtained from Patient. Family History Cancer - Mother, Diabetes - Father, Hypertension - Mother, Kidney Disease - Mother, Lung Disease - Father,  Thyroid Problems - Paternal Grandparents, No family history of Heart Disease, Seizures, Stroke, Tuberculosis. Social History Never smoker, Marital Status - Married, Alcohol Use - Never, Drug Use - No History, Caffeine Use - Daily - coffee. Medical History Eyes Denies history of Cataracts, Glaucoma, Optic Neuritis Ear/Nose/Mouth/Throat Denies history of Chronic sinus problems/congestion, Middle ear problems Hematologic/Lymphatic Patient has history of Anemia - iron Denies history of Hemophilia, Human Immunodeficiency Virus, Lymphedema, Sickle Cell Disease Respiratory Patient has history of Sleep Apnea - CPAP Denies history of Aspiration, Asthma, Chronic Obstructive Pulmonary Disease (COPD), Pneumothorax, Tuberculosis Cardiovascular Patient has history of Hypertension, Peripheral Venous Disease Denies history of Angina, Arrhythmia, Congestive Heart Failure, Coronary Artery Disease, Deep Vein Thrombosis, Hypotension, Myocardial Infarction, Peripheral Arterial Disease, Phlebitis, Vasculitis Gastrointestinal Denies history of Cirrhosis , Colitis, Crohnoos, Hepatitis A, Hepatitis B, Hepatitis C Endocrine Denies history of Type I Diabetes, Type II Diabetes Genitourinary Denies history of End Stage Renal Disease Immunological Denies history of Lupus Erythematosus, Raynaudoos, Scleroderma Integumentary (Skin) Denies history of History of Burn Musculoskeletal Denies history of Gout, Rheumatoid Arthritis, Osteoarthritis, Osteomyelitis Neurologic Denies history of Dementia, Neuropathy, Quadriplegia, Paraplegia, Seizure Disorder Oncologic Denies history of Received Chemotherapy, Received Radiation Psychiatric Denies history of Anorexia/bulimia, Confinement Anxiety Hospitalization/Surgery History - cholecystectomy 1980s. - nephrolithasis 1980s. - plate and rod in right elbow surgery 2010. Medical A Surgical History Notes nd Genitourinary years ago kidney stones Oncologic skin Ca  removed from back years ago Objective Constitutional Hypertensive, asymptomatic. Slightly tachycardic, asymptomatic. No acute distress.. Vitals Time Taken: 3:30 AM, Temperature: 98.1 F, Pulse: 101 bpm, Respiratory Rate: 20 breaths/min, Blood Pressure: 164/85 mmHg. KINLEIGH, NAULT ([id number]) [id number] Page 7 of 10 Respiratory Normal work of breathing on room air.. General Notes: 02/08/2022:  It looks like there is been some bruising at the wound site. The wound is about the same size in terms of dimensions. There is a fair amount of slough and nonviable tissue still present. Edema control is better than last week. Integumentary (Hair, Skin) Wound #2 status is Open. Original cause of wound was Insect Bite. The date acquired was: 11/21/2021. The wound has been in treatment 2 weeks. The wound is located on the Right,Posterior Lower Leg. The wound measures 1.4cm length x 1.6cm width x 0.8cm depth; 1.759cm^2 area and 1.407cm^3 volume. There is Fat Layer (Subcutaneous Tissue) exposed. There is no tunneling noted, however, there is undermining starting at 10:00 and ending at 3:00 with a maximum distance of 0.4cm. There is a medium amount of serosanguineous drainage noted. The wound margin is distinct with the outline attached to the wound base. There is small (1-33%) red granulation within the wound bed. There is a large (67-100%) amount of necrotic tissue within the wound bed including Adherent Slough. The periwound skin appearance had no abnormalities noted for texture. The periwound skin appearance had no abnormalities noted for moisture. The periwound skin appearance exhibited: Ecchymosis, Rubor. The periwound skin appearance did not exhibit: Erythema. Periwound temperature was noted as No Abnormality. Assessment Active Problems ICD-10 Non-pressure chronic ulcer of right calf with fat layer exposed Morbid (severe) obesity due to excess calories Essential (primary)  hypertension Localized edema Procedures Wound #2 Pre-procedure diagnosis of Wound #2 is a T be determined located on the Right,Posterior Lower Leg . There was a Excisional Skin/Subcutaneous Tissue o Debridement with a total area of 2.1 sq cm performed by Duanne Guessannon, Erykah Lippert, MD. With the following instrument(s): Curette to remove Viable and Non-Viable tissue/material. Material removed includes Subcutaneous Tissue and Slough and after achieving pain control using Lidocaine 4% T opical Solution. No specimens were taken. A time out was conducted at 15:52, prior to the start of the procedure. A Minimum amount of bleeding was controlled with N/A. The procedure was tolerated well. Post Debridement Measurements: 1.4cm length x 1.5cm width x 0.8cm depth; 1.319cm^3 volume. Character of Wound/Ulcer Post Debridement requires further debridement. Post procedure Diagnosis Wound #2: Same as Pre-Procedure General Notes: Scribed for Dr. Lady Garyannon by Tommie ArdJamie Zochol, RN. Pre-procedure diagnosis of Wound #2 is a T be determined located on the Right,Posterior Lower Leg . There was a Three Layer Compression Therapy o Procedure by Tommie ArdZochol, [name], RN. Post procedure Diagnosis Wound #2: Same as Pre-Procedure Plan Follow-up Appointments: Return Appointment in 1 week. - Dr. Lady Garyannon - room 4 Anesthetic: (In clinic) Topical Lidocaine 5% applied to wound bed Bathing/ Shower/ Hygiene: May shower with protection but do not get wound dressing(s) wet. Edema Control - Lymphedema / SCD / Other: Elevate legs to the level of the heart or above for 30 minutes daily and/or when sitting, a frequency of: - throughout the day Avoid standing for long periods of time. Exercise regularly Moisturize legs daily. Compression stocking or Garment 20-30 mm/Hg pressure to: - left leg daily. Apply first thing in the morning, remove at night. WOUND #2: - Lower Leg Wound Laterality: Right, Posterior Cleanser: Soap and Water 1 x Per Week/30  Days Discharge Instructions: May shower and wash wound with dial antibacterial soap and water prior to dressing change. Cleanser: Wound Cleanser 1 x Per Week/30 Days Discharge Instructions: Cleanse the wound with wound cleanser prior to applying a clean dressing using gauze sponges, not tissue or cotton balls. Peri-Wound Care: Sween Lotion (Moisturizing lotion) 1 x Per Week/30 Days  Discharge Instructions: Apply moisturizing lotion as directed Topical: Triamcinolone 1 x Per Week/30 Days Discharge Instructions: Apply Triamcinolone as directed Prim Dressing: KerraCel Ag Gelling Fiber Dressing, 2x2 in (silver alginate) 1 x Per Week/30 Days ary Discharge Instructions: Apply silver alginate to wound bed as instructed Secondary Dressing: Woven Gauze Sponge, Non-Sterile 4x4 in 1 x Per Week/30 Days Discharge Instructions: Apply over primary dressing as directed. Secured With: Transpore Surgical T ape, 2x10 (in/yd) 1 x Per Week/30 Days Discharge Instructions: Secure dressing with tape as directed. Com pression Wrap: ThreePress (3 layer compression wrap) 1 x Per Week/30 Days Discharge Instructions: Apply three layer compression as directed. AMANDO, ISHIKAWA ([id number]) [id number] Page 8 of 10 02/08/2022: It looks like there is been some bruising at the wound site. The wound is about the same size in terms of dimensions. There is a fair amount of slough and nonviable tissue still present. Edema control is better than last week. I used a curette to debride slough and nonviable subcutaneous tissue from her wound. We will continue to pack the wound with silver alginate and apply 3 layer compression. We will add a little zinc oxide and triamcinolone to try and protect her skin a little bit better. Follow-up in 1 week. Electronic Signature(s) Signed: 02/08/2022 4:11:24 PM By: [name] MD FACS Entered By: [name] on 02/08/2022  16:11:24 -------------------------------------------------------------------------------- HxROS Details Patient Name: Date of Service: [name], [name] 02/08/2022 3:15 PM Medical Record Number: [id number] Patient Account Number: [id number] Date of Birth/Sex: Treating RN: Oct 11, 1960 (61 y.o. F) Primary Care Provider: Theodis Shove Other Clinician: Referring Provider: Treating Provider/Extender: Beryle Flock Weeks in Treatment: 2 Information Obtained From Patient Eyes Medical History: Negative for: Cataracts; Glaucoma; Optic Neuritis Ear/Nose/Mouth/Throat Medical History: Negative for: Chronic sinus problems/congestion; Middle ear problems Hematologic/Lymphatic Medical History: Positive for: Anemia - iron Negative for: Hemophilia; Human Immunodeficiency Virus; Lymphedema; Sickle Cell Disease Respiratory Medical History: Positive for: Sleep Apnea - CPAP Negative for: Aspiration; Asthma; Chronic Obstructive Pulmonary Disease (COPD); Pneumothorax; Tuberculosis Cardiovascular Medical History: Positive for: Hypertension; Peripheral Venous Disease Negative for: Angina; Arrhythmia; Congestive Heart Failure; Coronary Artery Disease; Deep Vein Thrombosis; Hypotension; Myocardial Infarction; Peripheral Arterial Disease; Phlebitis; Vasculitis Gastrointestinal Medical History: Negative for: Cirrhosis ; Colitis; Crohns; Hepatitis A; Hepatitis B; Hepatitis C Endocrine Medical History: Negative for: Type I Diabetes; Type II Diabetes Genitourinary Medical History: Negative for: End Stage Renal Disease Past Medical History NotesEVON, DEJARNETT ([id number]) [id number] Page 9 of 10 years ago kidney stones Immunological Medical History: Negative for: Lupus Erythematosus; Raynauds; Scleroderma Integumentary (Skin) Medical History: Negative for: History of Burn Musculoskeletal Medical History: Negative for: Gout; Rheumatoid  Arthritis; Osteoarthritis; Osteomyelitis Neurologic Medical History: Negative for: Dementia; Neuropathy; Quadriplegia; Paraplegia; Seizure Disorder Oncologic Medical History: Negative for: Received Chemotherapy; Received Radiation Past Medical History Notes: skin Ca removed from back years ago Psychiatric Medical History: Negative for: Anorexia/bulimia; Confinement Anxiety Immunizations Pneumococcal Vaccine: Received Pneumococcal Vaccination: No Implantable Devices None Hospitalization / Surgery History Type of Hospitalization/Surgery cholecystectomy 1980s nephrolithasis 1980s plate and rod in right elbow surgery 2010 Family and Social History Cancer: Yes - Mother; Diabetes: Yes - Father; Heart Disease: No; Hypertension: Yes - Mother; Kidney Disease: Yes - Mother; Lung Disease: Yes - Father; Seizures: No; Stroke: No; Thyroid Problems: Yes - Paternal Grandparents; Tuberculosis: No; Never smoker; Marital Status - Married; Alcohol Use: Never; Drug Use: No History; Caffeine Use: Daily - coffee; Financial Concerns: No; Food, Clothing or Shelter Needs: No; Support System Lacking: No; Transportation Concerns:  No Electronic Signature(s) Signed: 02/08/2022 4:27:08 PM By: [name] MD FACS Entered By: [name] on 02/08/2022 16:09:09 -------------------------------------------------------------------------------- SuperBill Details Patient Name: Date of Service: [address] [name] 02/08/2022 Medical Record Number: [id number] Patient Account Number: [id number] Date of Birth/Sex: Treating RN: [birth year] (61 y.o. F) Primary Care Provider: Theodis Shove Other Clinician: Referring Provider: Treating Provider/Extender: Vladimir Faster in Treatment: 2 KATISHA, SHIMIZU ([id number]) [id number] Page 10 of 10 Diagnosis Coding ICD-10 Codes Code Description [phone] Non-pressure chronic ulcer of right calf with fat layer  exposed E66.01 Morbid (severe) obesity due to excess calories I10 Essential (primary) hypertension R60.0 Localized edema Facility Procedures : CPT4 Code: [id number] Description: 11042 - DEB SUBQ TISSUE 20 SQ CM/< ICD-10 Diagnosis Description L97.212 Non-pressure chronic ulcer of right calf with fat layer exposed Modifier: Quantity: 1 Physician Procedures : CPT4 Code Description Modifier [id number] 99214 - WC PHYS LEVEL 4 - EST PT 25 ICD-10 Diagnosis Description L97.212 Non-pressure chronic ulcer of right calf with fat layer exposed E66.01 Morbid (severe) obesity due to excess calories R60.0 Localized edema  I10 Essential (primary) hypertension Quantity: 1 : [id number] 11042 - WC PHYS SUBQ TISS 20 SQ CM ICD-10 Diagnosis Description L97.212 Non-pressure chronic ulcer of right calf with fat layer exposed Quantity: 1 Electronic Signature(s) Signed: 02/08/2022 4:11:40 PM By: [name] MD FACS Entered By: [name] on 02/08/2022 16:11:40

## 2022-02-11 NOTE — Progress Notes (Signed)
[name], [name] ([id number]) [id number] Page 1 of 7 Visit Report for 02/08/2022 Arrival Information Details Patient Name: Date of Service: [place], [name] 02/08/2022 3:15 PM Medical Record Number: [id number] Patient Account Number: [id number] Date of Birth/Sex: Treating RN: [birth year] (61 y.o. F) Primary Care [name]: [name] Other Clinician: Referring [name]: Treating [name]/Extender: [name] in Treatment: 2 Visit Information History Since Last Visit All ordered tests and consults were completed: No Patient Arrived: Ambulatory Added or deleted any medications: No Arrival Time: 15:29 Any new allergies or adverse reactions: No Transfer Assistance: None Had a fall or experienced change in No Patient Identification Verified: Yes activities of daily living that may affect Secondary Verification Process Completed: Yes risk of falls: Patient Requires Transmission-Based Precautions: No Signs or symptoms of abuse/neglect since last visito No Patient Has Alerts: No Hospitalized since last visit: No Implantable device outside of the clinic excluding No cellular tissue based products placed in the center since last visit: Pain Present Now: No Electronic Signature(s) Signed: 02/08/2022 4:32:48 PM By: Worthy Rancher Entered By: Worthy Rancher on 02/08/2022 15:30:47 -------------------------------------------------------------------------------- Compression Therapy Details Patient Name: Date of Service: [name], [name] 02/08/2022 3:15 PM Medical Record Number: [id number] Patient Account Number: [id number] Date of Birth/Sex: Treating RN: [name] (61 y.o. [name] Primary Care [name]: [name] Other Clinician: Referring [name]: Treating Tayla Panozzo/Extender: [name] Weeks in Treatment: 2 Compression Therapy Performed for Wound Assessment: Wound #2 Right,Posterior  Lower Leg Performed By: Clinician [name] East, RN Compression Type: Three Layer Post Procedure Diagnosis Same as Pre-procedure Electronic Signature(s) Signed: 02/11/2022 4:40:13 PM By: [name] East RN Entered By: [name] on 02/08/2022 15:54:24 [name] ([id number]) [id number] Page 2 of 7 -------------------------------------------------------------------------------- Lower Extremity Assessment Details Patient Name: Date of Service: [name] 02/08/2022 3:15 PM Medical Record Number: [id number] Patient Account Number: [id number] Date of Birth/Sex: Treating RN: [name] (61 y.o. [name] Primary Care Damion Kant: [name] Other Clinician: Referring Callahan Wild: Treating [name]/Extender: [name] Weeks in Treatment: 2 Edema Assessment Assessed: [Left: No] [Right: No] [Left: Edema] [Right: :] Calf Left: Right: Point of Measurement: From Medial Instep 48 cm Ankle Left: Right: Point of Measurement: From Medial Instep 23.5 cm Vascular Assessment Pulses: Dorsalis Pedis Palpable: [Right:Yes] Electronic Signature(s) Signed: 02/11/2022 4:40:13 PM By: [name] East RN Entered By: [name] on 02/08/2022 15:39:44 -------------------------------------------------------------------------------- Multi Wound Chart Details Patient Name: Date of Service: [name], [name] [name] H. 02/08/2022 3:15 PM Medical Record Number: [id number] Patient Account Number: [id number] Date of Birth/Sex: Treating RN: [name] (61 y.o. F) Primary Care [name]: [name] Other Clinician: Referring [name]: Treating [name]/Extender: [name] Weeks in Treatment: 2 Vital Signs Height(in): Pulse(bpm): 101 Weight(lbs): Blood Pressure(mmHg): 164/85 Body Mass Index(BMI): Temperature(F): 98.1 Respiratory Rate(breaths/min): 20 [2:Photos:] [N/A:N/A] Right, Posterior Lower Leg N/A  N/A Wound Location: Insect Bite N/A N/A Wounding Event: T be determined o N/A N/A Primary Etiology: Anemia, Sleep Apnea, Hypertension, N/A N/A Comorbid History: Peripheral Venous Disease [name] ([id number]) [id number] Page 3 of 7 11/21/2021 N/A N/A Date Acquired: 2 N/A N/A Weeks of Treatment: Open N/A N/A Wound Status: No N/A N/A Wound Recurrence: 1.4x1.6x0.8 N/A N/A Measurements L x W x D (cm) 1.759 N/A N/A A (cm) : rea 1.407 N/A N/A Volume (cm) : -433.00% N/A N/A % Reduction in A rea: -1321.20% N/A N/A % Reduction in Volume: 10 Starting Position 1 (o'clock): 3 Ending Position 1 (o'clock): 0.4 Maximum Distance  1 (cm): Yes N/A N/A Undermining: Full Thickness Without Exposed N/A N/A Classification: Support Structures Medium N/A N/A Exudate A mount: Serosanguineous N/A N/A Exudate Type: red, brown N/A N/A Exudate Color: Distinct, outline attached N/A N/A Wound Margin: Small (1-33%) N/A N/A Granulation A mount: Red N/A N/A Granulation Quality: Large (67-100%) N/A N/A Necrotic A mount: Fat Layer (Subcutaneous Tissue): Yes N/A N/A Exposed Structures: Fascia: No Tendon: No Muscle: No Joint: No Bone: No None N/A N/A Epithelialization: Debridement - Excisional N/A N/A Debridement: Pre-procedure Verification/Time Out 15:52 N/A N/A Taken: Lidocaine 4% Topical Solution N/A N/A Pain Control: Subcutaneous, Slough N/A N/A Tissue Debrided: Skin/Subcutaneous Tissue N/A N/A Level: 2.1 N/A N/A Debridement A (sq cm): rea Curette N/A N/A Instrument: Minimum N/A N/A Bleeding: Procedure was tolerated well N/A N/A Debridement Treatment Response: 1.4x1.5x0.8 N/A N/A Post Debridement Measurements L x W x D (cm) 1.319 N/A N/A Post Debridement Volume: (cm) No Abnormalities Noted N/A N/A Periwound Skin Texture: No Abnormalities Noted N/A N/A Periwound Skin Moisture: Ecchymosis: Yes N/A N/A Periwound Skin  Color: Rubor: Yes Erythema: No No Abnormality N/A N/A Temperature: Compression Therapy N/A N/A Procedures Performed: Debridement Treatment Notes Electronic Signature(s) Signed: 02/08/2022 4:08:01 PM By: [name] MD FACS Entered By: [name] on 02/08/2022 16:08:01 -------------------------------------------------------------------------------- Multi-Disciplinary Care Plan Details Patient Name: Date of Service: [name], [name] [name] H. 02/08/2022 3:15 PM Medical Record Number: [id number] Patient Account Number: [id number] Date of Birth/Sex: Treating RN: [birth year] (61 y.o. Kateri Mc Primary Care Treyveon Mochizuki: Theodis Shove Other Clinician: Referring Kaiven Vester: Treating Forrest Jaroszewski/Extender: Beryle Flock Weeks in Treatment: 2 Active Inactive Necrotic Tissue TASHIRA, TORRE ([id number]) [id number] Page 4 of 7 Nursing Diagnoses: Impaired tissue integrity related to necrotic/devitalized tissue Knowledge deficit related to management of necrotic/devitalized tissue Goals: Necrotic/devitalized tissue will be minimized in the wound bed Date Initiated: 01/23/2022 Target Resolution Date: 03/15/2022 Goal Status: Active Patient/caregiver will verbalize understanding of reason and process for debridement of necrotic tissue Date Initiated: 01/23/2022 Target Resolution Date: 03/15/2022 Goal Status: Active Interventions: Assess patient pain level pre-, during and post procedure and prior to discharge Provide education on necrotic tissue and debridement process Treatment Activities: Apply topical anesthetic as ordered : 01/23/2022 Notes: Wound/Skin Impairment Nursing Diagnoses: Impaired tissue integrity Knowledge deficit related to ulceration/compromised skin integrity Goals: Patient/caregiver will verbalize understanding of skin care regimen Date Initiated: 01/23/2022 Target Resolution Date: 03/15/2022 Goal Status:  Active Interventions: Assess patient/caregiver ability to obtain necessary supplies Assess ulceration(s) every visit Treatment Activities: Skin care regimen initiated : 01/23/2022 Topical wound management initiated : 01/23/2022 Notes: Electronic Signature(s) Signed: 02/11/2022 4:40:13 PM By: Tommie Ard RN Entered By: Tommie Ard on 02/08/2022 15:47:45 -------------------------------------------------------------------------------- Pain Assessment Details Patient Name: Date of Service: [name], [name] [name] H. 02/08/2022 3:15 PM Medical Record Number: [id number] Patient Account Number: [id number] Date of Birth/Sex: Treating RN: [name] (61 y.o. F) Primary Care Lark Runk: Theodis Shove Other Clinician: Referring Ausar Georgiou: Treating Rakia Frayne/Extender: Beryle Flock Weeks in Treatment: 2 Active Problems Location of Pain Severity and Description of Pain Patient Has Paino No Site Locations [name], [name] ([id number]) [id number] Page 5 of 7 Pain Management and Medication Current Pain Management: Electronic Signature(s) Signed: 02/08/2022 4:32:48 PM By: Dayton Scrape Entered By: Dayton Scrape on 02/08/2022 15:31:48 -------------------------------------------------------------------------------- Patient/Caregiver Education Details Patient Name: Date of Service: [name], [name] [name] H. 11/3/2023andnbsp3:15 PM Medical Record Number: [id number] Patient Account Number: [id number] Date of Birth/Gender: Treating RN: [name] (61 y.o. Kateri Mc Primary Care Physician: Theodis Shove Other Clinician: Referring  Physician: Treating Physician/Extender: Vladimir Faster in Treatment: 2 Education Assessment Education Provided To: Patient Education Topics Provided Wound Debridement: Methods: Explain/Verbal Responses: Reinforcements needed, State content correctly Electronic Signature(s) Signed: 02/11/2022  4:40:13 PM By: Tommie Ard RN Entered By: Tommie Ard on 02/08/2022 15:47:59 -------------------------------------------------------------------------------- Wound Assessment Details Patient Name: Date of Service: [name], [name] [name] H. 02/08/2022 3:15 PM Medical Record Number: [id number] Patient Account Number: [id number] Date of Birth/Sex: Treating RN: [birth year] [name] (61 y.o. Kateri Mc Primary Care Jazen Spraggins: Theodis Shove Other Clinician: Referring Shade Kaley: Treating [name]/Extender: Beryle Flock [name], [name] ([id number]) [id number] Page 6 of 7 Weeks in Treatment: 2 Wound Status Wound Number: 2 Primary T be determined o Etiology: Wound Location: Right, Posterior Lower Leg Wound Status: Open Wounding Event: Insect Bite Comorbid Anemia, Sleep Apnea, Hypertension, Peripheral Venous Date Acquired: 11/21/2021 History: Disease Weeks Of Treatment: 2 Clustered Wound: No Photos Wound Measurements Length: (cm) 1.4 Width: (cm) 1.6 Depth: (cm) 0.8 Area: (cm) 1.759 Volume: (cm) 1.407 % Reduction in Area: -433% % Reduction in Volume: -1321.2% Epithelialization: None Tunneling: No Undermining: Yes Starting Position (o'clock): 10 Ending Position (o'clock): 3 Maximum Distance: (cm) 0.4 Wound Description Classification: Full Thickness Without Exposed Support Structures Wound Margin: Distinct, outline attached Exudate Amount: Medium Exudate Type: Serosanguineous Exudate Color: red, brown Foul Odor After Cleansing: No Slough/Fibrino Yes Wound Bed Granulation Amount: Small (1-33%) Exposed Structure Granulation Quality: Red Fascia Exposed: No Necrotic Amount: Large (67-100%) Fat Layer (Subcutaneous Tissue) Exposed: Yes Necrotic Quality: Adherent Slough Tendon Exposed: No Muscle Exposed: No Joint Exposed: No Bone Exposed: No Periwound Skin Texture Texture Color No Abnormalities Noted: Yes No Abnormalities Noted:  No Ecchymosis: Yes Moisture Erythema: No No Abnormalities Noted: Yes Rubor: Yes Temperature / Pain Temperature: No Abnormality Electronic Signature(s) Signed: 02/11/2022 4:40:13 PM By: Tommie Ard RN Entered By: Tommie Ard on 02/08/2022 15:45:15 [name] ([id number]) [id number] Page 7 of 7 -------------------------------------------------------------------------------- Vitals Details Patient Name: Date of Service: [name], [name] [name] 02/08/2022 3:15 PM Medical Record Number: [id number] Patient Account Number: [id number] Date of Birth/Sex: Treating RN: [name] (61 y.o. F) Primary Care Yaretzy Olazabal: Theodis Shove Other Clinician: Referring Lynden Flemmer: Treating Leigh Kaeding/Extender: Beryle Flock Weeks in Treatment: 2 Vital Signs Time Taken: 03:30 Temperature (F): 98.1 Pulse (bpm): 101 Respiratory Rate (breaths/min): 20 Blood Pressure (mmHg): 164/85 Reference Range: 80 - 120 mg / dl Electronic Signature(s) Signed: 02/08/2022 4:32:48 PM By: Dayton Scrape Entered By: Dayton Scrape on 02/08/2022 15:31:22

## 2022-02-15 ENCOUNTER — Encounter (HOSPITAL_BASED_OUTPATIENT_CLINIC_OR_DEPARTMENT_OTHER): Payer: BC Managed Care – PPO | Admitting: General Surgery

## 2022-02-15 DIAGNOSIS — L97212 Non-pressure chronic ulcer of right calf with fat layer exposed: Secondary | ICD-10-CM | POA: Diagnosis not present

## 2022-02-15 NOTE — Progress Notes (Signed)
[name], [name] ([id number]) [id number] Page 1 of 10 Visit Report for 02/15/2022 Chief Complaint Document Details Patient Name: Date of Service: [name], [name] 02/15/2022 10:15 A M Medical Record Number: [id number] Patient Account Number: [id number] Date of Birth/Sex: Treating RN: 01/13/1961 (61 y.o. F) Primary Care Provider: Micheline Rough Other Clinician: Referring Provider: Treating Provider/Extender: Campbell Stall Weeks in Treatment: 3 Information Obtained from: Patient Chief Complaint RLE ulcer Electronic Signature(s) Signed: 02/15/2022 11:21:10 AM By: Fredirick Maudlin MD FACS Entered By: Fredirick Maudlin on 02/15/2022 11:21:10 -------------------------------------------------------------------------------- Debridement Details Patient Name: Date of Service: [name], [name] 02/15/2022 10:15 A M Medical Record Number: [id number] Patient Account Number: [id number] Date of Birth/Sex: Treating RN: 1960/08/30 (61 y.o. [name] Primary Care Provider: Micheline Rough Other Clinician: Referring Provider: Treating Provider/Extender: Campbell Stall Weeks in Treatment: 3 Debridement Performed for Assessment: Wound #2 Right,Posterior Lower Leg Performed By: Physician Fredirick Maudlin, MD Debridement Type: Debridement Severity of Tissue Pre Debridement: Fat layer exposed Level of Consciousness (Pre-procedure): Awake and Alert Pre-procedure Verification/Time Out Yes - 10:55 Taken: Start Time: 10:55 Pain Control: Lidocaine 4% T opical Solution T Area Debrided (L x W): otal 1.4 (cm) x 2.3 (cm) = 3.22 (cm) Tissue and other material debrided: Non-Viable, Slough, Subcutaneous, Slough Level: Skin/Subcutaneous Tissue Debridement Description: Excisional Instrument: Curette Bleeding: Minimum Hemostasis Achieved: Pressure End Time: 10:56 Procedural Pain: 0 Post Procedural Pain: 0 Response to  Treatment: Procedure was tolerated well Level of Consciousness (Post- Awake and Alert procedure): Post Debridement Measurements of Total Wound Length: (cm) 1.4 Width: (cm) 2.3 Depth: (cm) 0.6 Volume: (cm) 1.517 Character of Wound/Ulcer Post Debridement: Improved Severity of Tissue Post Debridement: Fat layer exposed [name], [name] ([id number]) [phone].pdf Page 2 of 10 Post Procedure Diagnosis Same as Pre-procedure Notes Scribed for Dr. Celine Ahr by J.Scotton Electronic Signature(s) Signed: 02/15/2022 12:06:25 PM By: Fredirick Maudlin MD FACS Signed: 02/15/2022 1:23:48 PM By: Dellie Catholic RN Entered By: Dellie Catholic on 02/15/2022 10:58:34 -------------------------------------------------------------------------------- HPI Details Patient Name: Date of Service: [name], [name] 02/15/2022 10:15 A M Medical Record Number: [id number] Patient Account Number: [id number] Date of Birth/Sex: Treating RN: March 31, 1961 (61 y.o. F) Primary Care Provider: Micheline Rough Other Clinician: Referring Provider: Treating Provider/Extender: Campbell Stall Weeks in Treatment: 3 History of Present Illness HPI Description: 02/16/2020 upon evaluation today patient actually appears to be doing poorly in regard to her left medial lower extremity ulcer. This is actually an area that she tells me she has had intermittent issues with over the years although has been closed for some time she typically uses compression right now she has juxta lite compression wraps. With that being said she tells me that this nonetheless open several weeks/months ago and has been given her trouble since. She does have a history of chronic venous insufficiency she is seeing specialist for this in the past she has had an ablation as well as sclerotherapy. With that being said she also has hypertension chronically which is managed by her primary care provider. In general she  seems to be worsening overall with regard to the wound and states that she finally realized that she needed to come in and have somebody look at this and not continue to try to manage this on her own. No fevers, chills, nausea, vomiting, or diarrhea. 02/23/2020 on evaluation today patient appears to be doing well with regard to her wound. This is showing some signs of improvement which is great news still were  not quite at the point where I would like to be as far as the overall appearance of the wound is concerned but I do believe this is better than last week. I do believe the Iodoflex is helping as well. 03/08/2020 upon evaluation today patient appears to be doing well with regard to her wound. She has been tolerating the dressing changes without complication. Fortunately I feel like she has made great progress with the Iodoflex but I feel like it may be the point rest to switch to something else possibly a collagen- based dressing at this time. 03/15/2020 upon evaluation today patient appears to be doing excellent in regard to her leg ulcer. She has been tolerating the dressing changes without complication. Fortunately there is no signs of active infection. Overall she is measuring a little bit smaller today which is great news. 03/22/2020 upon evaluation today patient appears to be doing well with regard to her wound. She has been tolerating the dressing changes without complication. Fortunately there is no signs of active infection at this time. 03/28/2020; patient I do not usually see however she has a wound on the left anterior lower leg secondary to chronic venous insufficiency we have been using silver collagen under compression. She arrives in clinic with a nonviable surface requiring debridement 04/12/2020 upon evaluation today patient appears to be doing well all things considered with regard to her leg ulcer. She is tolerating the dressing changes without complication there is minimal dry  skin around the edges of the wound that may be trapping and stopping some of the events of the new skin I am can work on that today. Otherwise the surface of the wound appears to be doing excellent. 04/19/2020 upon evaluation today patient appears to be doing well with regard to her leg ulcer. She has been tolerating dressing changes without complication. Fortunately there is no signs of active infection at this time. No fever chills noted overall very pleased with how things seem to be progressing. 04/26/2020 on evaluation today patient appears to be doing well with regard to her wound currently. Is showing signs of excellent improvement overall is filling in nicely and there does not appear to be any signs of infection. No fevers, chills, nausea, vomiting, or diarrhea. 05/03/2020 upon evaluation today patient appears to be doing well with regard to her wound on the leg. This overall showing signs of good improvement which is great she has some good epithelial growth and overall I think that things are moving in the correct direction. We likewise going to continue with the wound care measures as before since she seems to making such good improvement. 2/3; venous wound on the left medial leg. This is contracting. We are using Prisma and 3 layer compression. She has a stocking and waiting in the eventuality this heals. She is already using it on the right 05/17/2020 upon evaluation today patient appears to be doing well with regard to her leg ulcer. She has been tolerating the dressing changes without complication. Fortunately there is no signs of active infection which is great news and overall very pleased with where things stand today. No fevers, chills, nausea, vomiting, or diarrhea. 05/24/2020 upon evaluation today patient appears to be doing well with regard to her wound. Overall I feel like she is making excellent progress. There does not appear to be any signs of active infection which is great  news. 05/31/2020 upon evaluation today patient appears to be doing well with regard to her wound. There  does not appear to be any signs of active infection which is great news overall I am extremely pleased with where things stand today. READMISSION 01/23/2022 [name] ([id number]) [id number] Page 3 of 10 She returns to clinic today with a new wound on her right posterior calf. She says that she was cleaning out an old shed near the middle of August this year and then noticed what seemed to be a bug bite on her right posterior calf. It was itchy, red, and raised. By the end of September, an ulcer had developed. She has been applying various topical creams such as hydrocortisone and others to the site. When it was not improving, she made an appointment in the wound care center. ABI in clinic today was 0.97. On her right posterior calf, there is a circular wound with necrotic fat and black eschar present. There is no purulent drainage or malodor. The periwound skin is in good condition with just a little induration that appears to be secondary to inflammation. 01/31/2022: The wound measures a little bit larger today, but overall is quite a bit cleaner. There is some undermining from 9:00 to 3:00. She is having some periwound itching and says that her wrap slid. 02/08/2022: Despite using an Unna boot first layer at the top of her wrap, it slid again and it looks like there is been some bruising at the wound site. The wound is about the same size in terms of dimensions. There is a fair amount of slough and nonviable tissue still present. Edema control is better than last week. 02/15/2022: The wound dimensions are about the same. There is less nonviable tissue present. The periwound erythema has improved. Electronic Signature(s) Signed: 02/15/2022 11:21:58 AM By: Fredirick Maudlin MD FACS Entered By: Fredirick Maudlin on 02/15/2022  11:21:58 -------------------------------------------------------------------------------- Physical Exam Details Patient Name: Date of Service: [name], [name] 02/15/2022 10:15 A M Medical Record Number: [id number] Patient Account Number: [id number] Date of Birth/Sex: Treating RN: [birth year] (61 y.o. F) Primary Care Provider: Micheline Rough Other Clinician: Referring Provider: Treating Provider/Extender: Campbell Stall Weeks in Treatment: 3 Constitutional Slightly hypertensive. . . . No acute distress.Marland Kitchen Respiratory Normal work of breathing on room air.. Notes 02/15/2022: The wound dimensions are about the same. There is less nonviable tissue present. The periwound erythema has improved. Electronic Signature(s) Signed: 02/15/2022 11:22:35 AM By: Fredirick Maudlin MD FACS Entered By: Fredirick Maudlin on 02/15/2022 11:22:35 -------------------------------------------------------------------------------- Physician Orders Details Patient Name: Date of Service: [name], [name] 02/15/2022 10:15 A M Medical Record Number: [id number] Patient Account Number: [id number] Date of Birth/Sex: Treating RN: 1960-06-06 (61 y.o. [name] Primary Care Provider: Micheline Rough Other Clinician: Referring Provider: Treating Provider/Extender: Maye Hides in Treatment: 3 Verbal / Phone Orders: No Diagnosis Coding ICD-10 Coding Code Description [phone] Non-pressure chronic ulcer of right calf with fat layer exposed E66.01 Morbid (severe) obesity due to excess calories I10 Essential (primary) hypertension R60.0 Localized edema XI, PEARCEY ([id number]) [id number] Page 4 of 10 Follow-up Appointments ppointment in 1 week. - Dr. Celine Ahr - Room 4 Return A Anesthetic (In clinic) Topical Lidocaine 5% applied to wound bed Bathing/ Shower/ Hygiene May shower with protection but do not get wound  dressing(s) wet. Edema Control - Lymphedema / SCD / Other Bilateral Lower Extremities Elevate legs to the level of the heart or above for 30 minutes daily and/or when sitting, a frequency of: - throughout the day Avoid standing for long periods of time.  Exercise regularly Moisturize legs daily. Compression stocking or Garment 20-30 mm/Hg pressure to: - left leg daily. Apply first thing in the morning, remove at night. Wound Treatment Wound #2 - Lower Leg Wound Laterality: Right, Posterior Cleanser: Soap and Water 1 x Per Week/30 Days Discharge Instructions: May shower and wash wound with dial antibacterial soap and water prior to dressing change. Cleanser: Wound Cleanser 1 x Per Week/30 Days Discharge Instructions: Cleanse the wound with wound cleanser prior to applying a clean dressing using gauze sponges, not tissue or cotton balls. Peri-Wound Care: Sween Lotion (Moisturizing lotion) 1 x Per Week/30 Days Discharge Instructions: Apply moisturizing lotion as directed Topical: Triamcinolone 1 x Per Week/30 Days Discharge Instructions: Apply Triamcinolone as directed Prim Dressing: IODOFLEX 0.9% Cadexomer Iodine Pad 4x6 cm 1 x Per Week/30 Days ary Discharge Instructions: Apply to wound bed as instructed Secondary Dressing: Woven Gauze Sponge, Non-Sterile 4x4 in 1 x Per Week/30 Days Discharge Instructions: Apply over primary dressing as directed. Secured With: Transpore Surgical Tape, 2x10 (in/yd) 1 x Per Week/30 Days Discharge Instructions: Secure dressing with tape as directed. Compression Wrap: ThreePress (3 layer compression wrap) 1 x Per Week/30 Days Discharge Instructions: Apply three layer compression as directed. Compression Wrap: Unnaboot w/Calamine, 4x10 (in/yd) 1 x Per Week/30 Days Discharge Instructions: Apply Unnaboot at top of leg Electronic Signature(s) Signed: 02/15/2022 1:23:48 PM By: Dellie Catholic RN Signed: 02/15/2022 4:34:03 PM By: Fredirick Maudlin MD FACS Previous  Signature: 02/15/2022 12:06:25 PM Version By: Fredirick Maudlin MD FACS Entered By: Dellie Catholic on 02/15/2022 13:21:27 -------------------------------------------------------------------------------- Problem List Details Patient Name: Date of Service: [place], [name] 02/15/2022 10:15 A M Medical Record Number: [id number] Patient Account Number: [id number] Date of Birth/Sex: Treating RN: 1960-07-29 (61 y.o. F) Primary Care Provider: Micheline Rough Other Clinician: Referring Provider: Treating Provider/Extender: Maye Hides in Treatment: 3 Active Problems ICD-10 Encounter Code Description Active Date MDM Diagnosis SUNDAE, BERTH ([id number]) [id number] Page 5 of 10 [phone] Non-pressure chronic ulcer of right calf with fat layer exposed 01/23/2022 No Yes E66.01 Morbid (severe) obesity due to excess calories 01/23/2022 No Yes I10 Essential (primary) hypertension 01/23/2022 No Yes R60.0 Localized edema 01/23/2022 No Yes Inactive Problems Resolved Problems Electronic Signature(s) Signed: 02/15/2022 11:17:07 AM By: Fredirick Maudlin MD FACS Entered By: Fredirick Maudlin on 02/15/2022 11:17:07 -------------------------------------------------------------------------------- Progress Note Details Patient Name: Date of Service: [name], [name] 02/15/2022 10:15 A M Medical Record Number: [id number] Patient Account Number: [id number] Date of Birth/Sex: Treating RN: 1960/08/27 (61 y.o. F) Primary Care Provider: Micheline Rough Other Clinician: Referring Provider: Treating Provider/Extender: Campbell Stall Weeks in Treatment: 3 Subjective Chief Complaint Information obtained from Patient RLE ulcer History of Present Illness (HPI) 02/16/2020 upon evaluation today patient actually appears to be doing poorly in regard to her left medial lower extremity ulcer. This is actually an area that  she tells me she has had intermittent issues with over the years although has been closed for some time she typically uses compression right now she has juxta lite compression wraps. With that being said she tells me that this nonetheless open several weeks/months ago and has been given her trouble since. She does have a history of chronic venous insufficiency she is seeing specialist for this in the past she has had an ablation as well as sclerotherapy. With that being said she also has hypertension chronically which is managed by her primary care provider. In general she seems to be worsening overall with regard to  the wound and states that she finally realized that she needed to come in and have somebody look at this and not continue to try to manage this on her own. No fevers, chills, nausea, vomiting, or diarrhea. 02/23/2020 on evaluation today patient appears to be doing well with regard to her wound. This is showing some signs of improvement which is great news still were not quite at the point where I would like to be as far as the overall appearance of the wound is concerned but I do believe this is better than last week. I do believe the Iodoflex is helping as well. 03/08/2020 upon evaluation today patient appears to be doing well with regard to her wound. She has been tolerating the dressing changes without complication. Fortunately I feel like she has made great progress with the Iodoflex but I feel like it may be the point rest to switch to something else possibly a collagen- based dressing at this time. 03/15/2020 upon evaluation today patient appears to be doing excellent in regard to her leg ulcer. She has been tolerating the dressing changes without complication. Fortunately there is no signs of active infection. Overall she is measuring a little bit smaller today which is great news. 03/22/2020 upon evaluation today patient appears to be doing well with regard to her wound. She has  been tolerating the dressing changes without complication. Fortunately there is no signs of active infection at this time. 03/28/2020; patient I do not usually see however she has a wound on the left anterior lower leg secondary to chronic venous insufficiency we have been using silver collagen under compression. She arrives in clinic with a nonviable surface requiring debridement 04/12/2020 upon evaluation today patient appears to be doing well all things considered with regard to her leg ulcer. She is tolerating the dressing changes without complication there is minimal dry skin around the edges of the wound that may be trapping and stopping some of the events of the new skin I am can work on that today. Otherwise the surface of the wound appears to be doing excellent. 04/19/2020 upon evaluation today patient appears to be doing well with regard to her leg ulcer. She has been tolerating dressing changes without complication. Fortunately there is no signs of active infection at this time. No fever chills noted overall very pleased with how things seem to be progressing. [name], [name] ([id number]) [id number] Page 6 of 10 04/26/2020 on evaluation today patient appears to be doing well with regard to her wound currently. Is showing signs of excellent improvement overall is filling in nicely and there does not appear to be any signs of infection. No fevers, chills, nausea, vomiting, or diarrhea. 05/03/2020 upon evaluation today patient appears to be doing well with regard to her wound on the leg. This overall showing signs of good improvement which is great she has some good epithelial growth and overall I think that things are moving in the correct direction. We likewise going to continue with the wound care measures as before since she seems to making such good improvement. 2/3; venous wound on the left medial leg. This is contracting. We are using Prisma and 3 layer  compression. She has a stocking and waiting in the eventuality this heals. She is already using it on the right 05/17/2020 upon evaluation today patient appears to be doing well with regard to her leg ulcer. She has been tolerating the dressing changes without complication. Fortunately there is no signs of active infection  which is great news and overall very pleased with where things stand today. No fevers, chills, nausea, vomiting, or diarrhea. 05/24/2020 upon evaluation today patient appears to be doing well with regard to her wound. Overall I feel like she is making excellent progress. There does not appear to be any signs of active infection which is great news. 05/31/2020 upon evaluation today patient appears to be doing well with regard to her wound. There does not appear to be any signs of active infection which is great news overall I am extremely pleased with where things stand today. READMISSION 01/23/2022 She returns to clinic today with a new wound on her right posterior calf. She says that she was cleaning out an old shed near the middle of August this year and then noticed what seemed to be a bug bite on her right posterior calf. It was itchy, red, and raised. By the end of September, an ulcer had developed. She has been applying various topical creams such as hydrocortisone and others to the site. When it was not improving, she made an appointment in the wound care center. ABI in clinic today was 0.97. On her right posterior calf, there is a circular wound with necrotic fat and black eschar present. There is no purulent drainage or malodor. The periwound skin is in good condition with just a little induration that appears to be secondary to inflammation. 01/31/2022: The wound measures a little bit larger today, but overall is quite a bit cleaner. There is some undermining from 9:00 to 3:00. She is having some periwound itching and says that her wrap slid. 02/08/2022: Despite using an  Unna boot first layer at the top of her wrap, it slid again and it looks like there is been some bruising at the wound site. The wound is about the same size in terms of dimensions. There is a fair amount of slough and nonviable tissue still present. Edema control is better than last week. 02/15/2022: The wound dimensions are about the same. There is less nonviable tissue present. The periwound erythema has improved. Patient History Information obtained from Patient. Family History Cancer - Mother, Diabetes - Father, Hypertension - Mother, Kidney Disease - Mother, Lung Disease - Father, Thyroid Problems - Paternal Grandparents, No family history of Heart Disease, Seizures, Stroke, Tuberculosis. Social History Never smoker, Marital Status - Married, Alcohol Use - Never, Drug Use - No History, Caffeine Use - Daily - coffee. Medical History Eyes Denies history of Cataracts, Glaucoma, Optic Neuritis Ear/Nose/Mouth/Throat Denies history of Chronic sinus problems/congestion, Middle ear problems Hematologic/Lymphatic Patient has history of Anemia - iron Denies history of Hemophilia, Human Immunodeficiency Virus, Lymphedema, Sickle Cell Disease Respiratory Patient has history of Sleep Apnea - CPAP Denies history of Aspiration, Asthma, Chronic Obstructive Pulmonary Disease (COPD), Pneumothorax, Tuberculosis Cardiovascular Patient has history of Hypertension, Peripheral Venous Disease Denies history of Angina, Arrhythmia, Congestive Heart Failure, Coronary Artery Disease, Deep Vein Thrombosis, Hypotension, Myocardial Infarction, Peripheral Arterial Disease, Phlebitis, Vasculitis Gastrointestinal Denies history of Cirrhosis , Colitis, Crohnoos, Hepatitis A, Hepatitis B, Hepatitis C Endocrine Denies history of Type I Diabetes, Type II Diabetes Genitourinary Denies history of End Stage Renal Disease Immunological Denies history of Lupus Erythematosus, Raynaudoos, Scleroderma Integumentary  (Skin) Denies history of History of Burn Musculoskeletal Denies history of Gout, Rheumatoid Arthritis, Osteoarthritis, Osteomyelitis Neurologic Denies history of Dementia, Neuropathy, Quadriplegia, Paraplegia, Seizure Disorder Oncologic Denies history of Received Chemotherapy, Received Radiation Psychiatric Denies history of Anorexia/bulimia, Confinement Anxiety Hospitalization/Surgery History - cholecystectomy 1980s. - nephrolithasis 1980s. -  plate and rod in right elbow surgery 2010. 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 No acute distress.. Vitals Time Taken: 10:30 AM, Temperature: 98.0 F, Pulse: 85 bpm, Respiratory Rate: 20 breaths/min, Blood Pressure: 141/84 mmHg. Respiratory Normal work of breathing on room air.. General Notes: 02/15/2022: The wound dimensions are about the same. There is less nonviable tissue present. The periwound erythema has improved. Integumentary (Hair, Skin) Wound #2 status is Open. Original cause of wound was Insect Bite. The date acquired was: 11/21/2021. The wound has been in treatment 3 weeks. The wound is located on the Right,Posterior Lower Leg. The wound measures 1.4cm length x 2.3cm width x 0.6cm depth; 2.529cm^2 area and 1.517cm^3 volume. There is Fat Layer (Subcutaneous Tissue) exposed. There is no tunneling noted, however, there is undermining starting at 10:00 and ending at 3:00 with a maximum distance of 0.4cm. There is a medium amount of serosanguineous drainage noted. The wound margin is distinct with the outline attached to the wound base. There is small (1-33%) red granulation within the wound bed. There is a large (67-100%) amount of necrotic tissue within the wound bed including Eschar and Adherent Slough. The periwound skin  appearance exhibited: Atrophie Blanche, Rubor. The periwound skin appearance did not exhibit: Ecchymosis, Erythema. Periwound temperature was noted as No Abnormality. Assessment Active Problems ICD-10 Non-pressure chronic ulcer of right calf with fat layer exposed Morbid (severe) obesity due to excess calories Essential (primary) hypertension Localized edema Procedures Wound #2 Pre-procedure diagnosis of Wound #2 is a Venous Leg Ulcer located on the Right,Posterior Lower Leg .Severity of Tissue Pre Debridement is: Fat layer exposed. There was a Excisional Skin/Subcutaneous Tissue Debridement with a total area of 3.22 sq cm performed by Fredirick Maudlin, MD. With the following instrument(s): Curette to remove Non-Viable tissue/material. Material removed includes Subcutaneous Tissue and Slough and after achieving pain control using Lidocaine 4% T opical Solution. No specimens were taken. A time out was conducted at 10:55, prior to the start of the procedure. A Minimum amount of bleeding was controlled with Pressure. The procedure was tolerated well with a pain level of 0 throughout and a pain level of 0 following the procedure. Post Debridement Measurements: 1.4cm length x 2.3cm width x 0.6cm depth; 1.517cm^3 volume. Character of Wound/Ulcer Post Debridement is improved. Severity of Tissue Post Debridement is: Fat layer exposed. Post procedure Diagnosis Wound #2: Same as Pre-Procedure General Notes: Scribed for Dr. Celine Ahr by J.Scotton. Pre-procedure diagnosis of Wound #2 is a Venous Leg Ulcer located on the Right,Posterior Lower Leg . There was a Three Layer Compression Therapy Procedure by Dellie Catholic, RN. Post procedure Diagnosis Wound #2: Same as Pre-Procedure Plan Follow-up Appointments: Return Appointment in 1 week. - Dr. Celine Ahr - Room 4 Anesthetic: (In clinic) Topical Lidocaine 5% applied to wound bed Bathing/ Shower/ Hygiene: May shower with protection but do not get wound  dressing(s) wet. Edema Control - Lymphedema / SCD / Other: Elevate legs to the level of the heart or above for 30 minutes daily and/or when sitting, a frequency of: - throughout the day Avoid standing for long periods of time. Exercise regularly Moisturize legs daily. Compression stocking or Garment 20-30 mm/Hg pressure to: - left leg daily. Apply first thing in the morning, remove at night. WOUND #2: - Lower Leg Wound Laterality: Right, Posterior Cleanser: Soap and Water 1 x Per Week/30  Days Discharge Instructions: May shower and wash wound with dial antibacterial soap and water prior to dressing change. Cleanser: Wound Cleanser 1 x Per Week/30 Days Discharge Instructions: Cleanse the wound with wound cleanser prior to applying a clean dressing using gauze sponges, not tissue or cotton balls. [name], [name] ([id number]) [id number] Page 8 of 10 Peri-Wound Care: Sween Lotion (Moisturizing lotion) 1 x Per Week/30 Days Discharge Instructions: Apply moisturizing lotion as directed Topical: Triamcinolone 1 x Per Week/30 Days Discharge Instructions: Apply Triamcinolone as directed Prim Dressing: IODOFLEX 0.9% Cadexomer Iodine Pad 4x6 cm 1 x Per Week/30 Days ary Discharge Instructions: Apply to wound bed as instructed Secondary Dressing: Woven Gauze Sponge, Non-Sterile 4x4 in 1 x Per Week/30 Days Discharge Instructions: Apply over primary dressing as directed. Secured With: Transpore Surgical T ape, 2x10 (in/yd) 1 x Per Week/30 Days Discharge Instructions: Secure dressing with tape as directed. Com pression Wrap: ThreePress (3 layer compression wrap) 1 x Per Week/30 Days Discharge Instructions: Apply three layer compression as directed. Com pression Wrap: Unnaboot w/Calamine, 4x10 (in/yd) 1 x Per Week/30 Days Discharge Instructions: Apply Unnaboot at top of leg 02/15/2022: The wound dimensions are about the same. There is less nonviable tissue present. The  periwound erythema has improved. I used a curette to debride slough and nonviable subcutaneous tissue from the wound. I am going to change her dressing to Iodoflex to provide continuous chemical debridement during the week. We will continue to apply periwound triamcinolone with 3 layer compression and Unna boot first layer at the top. Follow- up in 1 week. Electronic Signature(s) Signed: 02/15/2022 1:23:48 PM By: Karie Schwalbe RN Signed: 02/15/2022 4:34:03 PM By: [name] MD FACS Previous Signature: 02/15/2022 11:23:41 AM Version By: [name] MD FACS Entered By: Karie Schwalbe on 02/15/2022 13:21:47 -------------------------------------------------------------------------------- HxROS Details Patient Name: Date of Service: [name], [name] [name] 02/15/2022 10:15 A M Medical Record Number: [id number] Patient Account Number: [id number] Date of Birth/Sex: Treating RN: 16-Mar-1961 (61 y.o. F) Primary Care Provider: Theodis Shove Other Clinician: Referring Provider: Treating Provider/Extender: Vladimir Faster in Treatment: 3 Information Obtained From Patient Eyes Medical History: Negative for: Cataracts; Glaucoma; Optic Neuritis Ear/Nose/Mouth/Throat Medical History: Negative for: Chronic sinus problems/congestion; Middle ear problems Hematologic/Lymphatic Medical History: Positive for: Anemia - iron Negative for: Hemophilia; Human Immunodeficiency Virus; Lymphedema; Sickle Cell Disease Respiratory Medical History: Positive for: Sleep Apnea - CPAP Negative for: Aspiration; Asthma; Chronic Obstructive Pulmonary Disease (COPD); Pneumothorax; Tuberculosis Cardiovascular Medical History: Positive for: Hypertension; Peripheral Venous Disease Negative for: Angina; Arrhythmia; Congestive Heart Failure; Coronary Artery Disease; Deep Vein Thrombosis; Hypotension; Myocardial Infarction; Peripheral Arterial Disease; Phlebitis; Vasculitis ASHIA, DEHNER ([id number]) [id number].pdf Page 9 of 10 Gastrointestinal Medical History: Negative for: Cirrhosis ; Colitis; Crohns; Hepatitis A; Hepatitis B; Hepatitis C Endocrine Medical History: Negative for: Type I Diabetes; Type II Diabetes Genitourinary Medical History: Negative for: End Stage Renal Disease Past Medical History Notes: years ago kidney stones Immunological Medical History: Negative for: Lupus Erythematosus; Raynauds; Scleroderma Integumentary (Skin) Medical History: Negative for: History of Burn Musculoskeletal Medical History: Negative for: Gout; Rheumatoid Arthritis; Osteoarthritis; Osteomyelitis Neurologic Medical History: Negative for: Dementia; Neuropathy; Quadriplegia; Paraplegia; Seizure Disorder Oncologic Medical History: Negative for: Received Chemotherapy; Received Radiation Past Medical History Notes: skin Ca removed from back years ago Psychiatric Medical History: Negative for: Anorexia/bulimia; Confinement Anxiety Immunizations Pneumococcal Vaccine: Received Pneumococcal Vaccination: No Implantable Devices None Hospitalization / Surgery History Type of Hospitalization/Surgery cholecystectomy 1980s nephrolithasis 1980s plate and rod in right elbow surgery 2010  Family and Social History Cancer: Yes - Mother; Diabetes: Yes - Father; Heart Disease: No; Hypertension: Yes - Mother; Kidney Disease: Yes - Mother; Lung Disease: Yes - Father; Seizures: No; Stroke: No; Thyroid Problems: Yes - Paternal Grandparents; Tuberculosis: No; Never smoker; Marital Status - Married; Alcohol Use: Never; Drug Use: No History; Caffeine Use: Daily - coffee; Financial Concerns: No; Food, Clothing or Shelter Needs: No; Support System Lacking: No; Transportation Concerns: No Electronic Signature(s) Signed: 02/15/2022 12:06:25 PM By: Fredirick Maudlin MD FACS Entered By: Fredirick Maudlin on 02/15/2022 11:22:04 [name] ([id number] Page 10 of 10 -------------------------------------------------------------------------------- SuperBill Details Patient Name: Date of Service: [name] 02/15/2022 Medical Record Number: [id number] Patient Account Number: [id number] Date of Birth/Sex: Treating RN: Apr 07, 1961 (61 y.o. F) Primary Care Provider: Micheline Rough Other Clinician: Referring Provider: Treating Provider/Extender: Campbell Stall Weeks in Treatment: 3 Diagnosis Coding ICD-10 Codes Code Description [phone] Non-pressure chronic ulcer of right calf with fat layer exposed E66.01 Morbid (severe) obesity due to excess calories I10 Essential (primary) hypertension R60.0 Localized edema Facility Procedures : CPT4 Code: [id number] Description: [id number] - DEB SUBQ TISSUE 20 SQ CM/< ICD-10 Diagnosis Description L97.212 Non-pressure chronic ulcer of right calf with fat layer exposed Modifier: Quantity: 1 Physician Procedures : CPT4 Code Description Modifier [id number] - WC PHYS LEVEL 3 - EST PT 25 ICD-10 Diagnosis Description L97.212 Non-pressure chronic ulcer of right calf with fat layer exposed E66.01 Morbid (severe) obesity due to excess calories R60.0 Localized edema  I10 Essential (primary) hypertension Quantity: 1 : [id number] - WC PHYS SUBQ TISS 20 SQ CM ICD-10 Diagnosis Description L97.212 Non-pressure chronic ulcer of right calf with fat layer exposed Quantity: 1 Electronic Signature(s) Signed: 02/15/2022 11:24:28 AM By: Fredirick Maudlin MD FACS Entered By: Fredirick Maudlin on 02/15/2022 11:24:27

## 2022-02-15 NOTE — Progress Notes (Signed)
[name], [name] ([id number]) [id number] Page 1 of 8 Visit Report for 02/15/2022 Arrival Information Details Patient Name: Date of Service: [place], [name]. 02/15/2022 10:15 A M Medical Record Number: [id number] Patient Account Number: [id number] Date of Birth/Sex: Treating RN: 1961-03-23 (61 y.o. F) Primary Care Sumie Remsen: Theodis Shove Other Clinician: Referring Timm Bonenberger: Treating Estefanny Moler/Extender: Vladimir Faster in Treatment: 3 Visit Information History Since Last Visit All ordered tests and consults were completed: No Patient Arrived: Ambulatory Added or deleted any medications: No Arrival Time: 10:33 Any new allergies or adverse reactions: No Accompanied By: self Had a fall or experienced change in No Transfer Assistance: None activities of daily living that may affect Patient Identification Verified: Yes risk of falls: Secondary Verification Process Completed: Yes Signs or symptoms of abuse/neglect since last visito No Patient Requires Transmission-Based Precautions: No Hospitalized since last visit: No Patient Has Alerts: No Implantable device outside of the clinic excluding No cellular tissue based products placed in the center since last visit: Pain Present Now: No Electronic Signature(s) Signed: 02/15/2022 12:39:03 PM By: Dayton Scrape Entered By: Dayton Scrape on 02/15/2022 10:33:36 -------------------------------------------------------------------------------- Compression Therapy Details Patient Name: Date of Service: [place], [name] [name] H. 02/15/2022 10:15 A M Medical Record Number: [id number] Patient Account Number: [id number] Date of Birth/Sex: Treating RN: [birth year] (61 y.o. Katrinka Blazing Primary Care Cheree Fowles: Theodis Shove Other Clinician: Referring Atlee Kluth: Treating Astaria Nanez/Extender: Beryle Flock Weeks in Treatment: 3 Compression Therapy Performed for Wound  Assessment: Wound #2 Right,Posterior Lower Leg Performed By: Clinician Karie Schwalbe, RN Compression Type: Three Layer Post Procedure Diagnosis Same as Pre-procedure Electronic Signature(s) Signed: 02/15/2022 1:23:48 PM By: Karie Schwalbe RN Entered By: Karie Schwalbe on 02/15/2022 13:20:05 [name] ([id number]) [id number] Page 2 of 8 -------------------------------------------------------------------------------- Encounter Discharge Information Details Patient Name: Date of Service: [place] 02/15/2022 10:15 A M Medical Record Number: [id number] Patient Account Number: [id number] Date of Birth/Sex: Treating RN: 1960-09-24 (61 y.o. Katrinka Blazing Primary Care Rafal Archuleta: Theodis Shove Other Clinician: Referring Kearia Yin: Treating Kaelee Pfeffer/Extender: Vladimir Faster in Treatment: 3 Encounter Discharge Information Items Post Procedure Vitals Discharge Condition: Stable Temperature (F): 98 Ambulatory Status: Ambulatory Pulse (bpm): 85 Discharge Destination: Home Respiratory Rate (breaths/min): 20 Transportation: Private Auto Blood Pressure (mmHg): 141/84 Accompanied By: self Schedule Follow-up Appointment: Yes Clinical Summary of Care: Patient Declined Electronic Signature(s) Signed: 02/15/2022 1:23:48 PM By: Karie Schwalbe RN Entered By: Karie Schwalbe on 02/15/2022 13:20:49 -------------------------------------------------------------------------------- Lower Extremity Assessment Details Patient Name: Date of Service: [place], [name] [name] H. 02/15/2022 10:15 A M Medical Record Number: [id number] Patient Account Number: [id number] Date of Birth/Sex: Treating RN: October 23, 1960 (61 y.o. Katrinka Blazing Primary Care Cyriah Childrey: Theodis Shove Other Clinician: Referring Kameran Mcneese: Treating Traveion Ruddock/Extender: Beryle Flock Weeks in Treatment: 3 Edema Assessment Assessed: [Left:  No] [Right: No] [Left: Edema] [Right: :] Calf Left: Right: Point of Measurement: From Medial Instep 45.5 cm Ankle Left: Right: Point of Measurement: From Medial Instep 23.4 cm Vascular Assessment Pulses: Dorsalis Pedis Palpable: [Right:Yes] Electronic Signature(s) Signed: 02/15/2022 1:23:48 PM By: Karie Schwalbe RN Entered By: Karie Schwalbe on 02/15/2022 10:37:45 Multi Wound Chart Details -------------------------------------------------------------------------------- [name] ([id number]) [id number] Page 3 of 8 Patient Name: Date of Service: [place], [name] 02/15/2022 10:15 A M Medical Record Number: [id number] Patient Account Number: [id number] Date of Birth/Sex: Treating RN: Nov 24, 1960 (60 y.o. F) Primary Care Fate Galanti: Theodis Shove Other Clinician: Referring Natividad Schlosser: Treating Angeleen Horney/Extender: Adora Fridge, Pervis Hocking  in Treatment: 3 Vital Signs Height(in): Pulse(bpm): 85 Weight(lbs): Blood Pressure(mmHg): 141/84 Body Mass Index(BMI): Temperature(F): 98.0 Respiratory Rate(breaths/min): 20 Wound Assessments Wound Number: 2 N/A N/A Photos: N/A N/A Right, Posterior Lower Leg N/A N/A Wound Location: Insect Bite N/A N/A Wounding Event: Venous Leg Ulcer N/A N/A Primary Etiology: Anemia, Sleep Apnea, Hypertension, N/A N/A Comorbid History: Peripheral Venous Disease 11/21/2021 N/A N/A Date Acquired: 3 N/A N/A Weeks of Treatment: Open N/A N/A Wound Status: No N/A N/A Wound Recurrence: 1.4x2.3x0.6 N/A N/A Measurements L x W x D (cm) 2.529 N/A N/A A (cm) : rea 1.517 N/A N/A Volume (cm) : -666.40% N/A N/A % Reduction in A rea: -1432.30% N/A N/A % Reduction in Volume: 10 Starting Position 1 (o'clock): 3 Ending Position 1 (o'clock): 0.4 Maximum Distance 1 (cm): Yes N/A N/A Undermining: Full Thickness Without Exposed N/A N/A Classification: Support Structures Medium N/A N/A Exudate A  mount: Serosanguineous N/A N/A Exudate Type: red, brown N/A N/A Exudate Color: Distinct, outline attached N/A N/A Wound Margin: Small (1-33%) N/A N/A Granulation A mount: Red N/A N/A Granulation Quality: Large (67-100%) N/A N/A Necrotic A mount: Eschar, Adherent Slough N/A N/A Necrotic Tissue: Fat Layer (Subcutaneous Tissue): Yes N/A N/A Exposed Structures: Fascia: No Tendon: No Muscle: No Joint: No Bone: No None N/A N/A Epithelialization: Debridement - Excisional N/A N/A Debridement: Pre-procedure Verification/Time Out 10:55 N/A N/A Taken: Lidocaine 4% Topical Solution N/A N/A Pain Control: Subcutaneous, Slough N/A N/A Tissue Debrided: Skin/Subcutaneous Tissue N/A N/A Level: 3.22 N/A N/A Debridement A (sq cm): rea Curette N/A N/A Instrument: Minimum N/A N/A Bleeding: Pressure N/A N/A Hemostasis A chieved: 0 N/A N/A Procedural Pain: 0 N/A N/A Post Procedural Pain: Procedure was tolerated well N/A N/A Debridement Treatment Response: 1.4x2.3x0.6 N/A N/A Post Debridement Measurements L x W x D (cm) 1.517 N/A N/A Post Debridement Volume: (cm) Atrophie Blanche: Yes N/A N/A Periwound Skin Color: Rubor: Yes Ecchymosis: No Erythema: No No Abnormality N/A N/A TemperatureALEXIS, [name] ([id number]) [id number] Page 4 of 8 Debridement N/A N/A Procedures Performed: Treatment Notes Electronic Signature(s) Signed: 02/15/2022 11:20:44 AM By: [name] MD FACS Entered By: [name] on 02/15/2022 11:20:44 -------------------------------------------------------------------------------- Multi-Disciplinary Care Plan Details Patient Name: Date of Service: [place], [name] [name] H. 02/15/2022 10:15 A M Medical Record Number: [id number] Patient Account Number: [id number] Date of Birth/Sex: Treating RN: March 17, 1961 (61 y.o. Katrinka Blazing Primary Care Lougenia Morrissey: Theodis Shove Other Clinician: Referring Providencia Hottenstein: Treating  Ozie Lupe/Extender: Vladimir Faster in Treatment: 3 Active Inactive Necrotic Tissue Nursing Diagnoses: Impaired tissue integrity related to necrotic/devitalized tissue Knowledge deficit related to management of necrotic/devitalized tissue Goals: Necrotic/devitalized tissue will be minimized in the wound bed Date Initiated: 01/23/2022 Target Resolution Date: 06/06/2022 Goal Status: Active Patient/caregiver will verbalize understanding of reason and process for debridement of necrotic tissue Date Initiated: 01/23/2022 Target Resolution Date: 06/06/2022 Goal Status: Active Interventions: Assess patient pain level pre-, during and post procedure and prior to discharge Provide education on necrotic tissue and debridement process Treatment Activities: Apply topical anesthetic as ordered : 01/23/2022 Notes: Wound/Skin Impairment Nursing Diagnoses: Impaired tissue integrity Knowledge deficit related to ulceration/compromised skin integrity Goals: Patient/caregiver will verbalize understanding of skin care regimen Date Initiated: 01/23/2022 Target Resolution Date: 06/06/2022 Goal Status: Active Interventions: Assess patient/caregiver ability to obtain necessary supplies Assess ulceration(s) every visit Treatment Activities: Skin care regimen initiated : 01/23/2022 Topical wound management initiated : 01/23/2022 Notes: Electronic Signature(s) Signed: 02/15/2022 1:23:48 PM By: Karie Schwalbe RN [name],Signed: 02/15/2022 1:23:48 PM By: Karie Schwalbe  RN Romie Minus ([id number]) [id number] Page 5 of 8 Entered By: Karie Schwalbe on 02/15/2022 13:18:56 -------------------------------------------------------------------------------- Pain Assessment Details Patient Name: Date of Service: [place]. 02/15/2022 10:15 A M Medical Record Number: [id number] Patient Account Number: [id number] Date of Birth/Sex: Treating RN: August 01, 1960  (61 y.o. Katrinka Blazing Primary Care Janice Seales: Theodis Shove Other Clinician: Referring Rhylen Shaheen: Treating Robin Petrakis/Extender: Beryle Flock Weeks in Treatment: 3 Active Problems Location of Pain Severity and Description of Pain Patient Has Paino No Site Locations Pain Management and Medication Current Pain Management: Electronic Signature(s) Signed: 02/15/2022 1:23:48 PM By: Karie Schwalbe RN Entered By: Karie Schwalbe on 02/15/2022 10:36:09 -------------------------------------------------------------------------------- Patient/Caregiver Education Details Patient Name: Date of Service: [name], [name] [name] H. 11/10/2023andnbsp10:15 A M Medical Record Number: [id number] Patient Account Number: [id number] Date of Birth/Gender: Treating RN: 1960-08-22 (61 y.o. Katrinka Blazing Primary Care Physician: Theodis Shove Other Clinician: Referring Physician: Treating Physician/Extender: Vladimir Faster in Treatment: 3 Education Assessment Education Provided To: Patient Education Topics Provided [name], [name] ([id number]) [id number] Page 6 of 8 Wound Debridement: Methods: Explain/Verbal Responses: Return demonstration correctly Electronic Signature(s) Signed: 02/15/2022 1:23:48 PM By: Karie Schwalbe RN Entered By: Karie Schwalbe on 02/15/2022 13:19:12 -------------------------------------------------------------------------------- Wound Assessment Details Patient Name: Date of Service: [place], [name] [name] H. 02/15/2022 10:15 A M Medical Record Number: [id number] Patient Account Number: [id number] Date of Birth/Sex: Treating RN: Aug 17, 1960 (61 y.o. Katrinka Blazing Primary Care Juelz Whittenberg: Theodis Shove Other Clinician: Referring Jeanise Durfey: Treating Roosevelt Eimers/Extender: Beryle Flock Weeks in Treatment: 3 Wound Status Wound Number: 2 Primary Venous Leg  Ulcer Etiology: Wound Location: Right, Posterior Lower Leg Wound Status: Open Wounding Event: Insect Bite Comorbid Anemia, Sleep Apnea, Hypertension, Peripheral Venous Date Acquired: 11/21/2021 History: Disease Weeks Of Treatment: 3 Clustered Wound: No Photos Wound Measurements Length: (cm) 1.4 Width: (cm) 2.3 Depth: (cm) 0.6 Area: (cm) 2.529 Volume: (cm) 1.517 % Reduction in Area: -666.4% % Reduction in Volume: -1432.3% Epithelialization: None Tunneling: No Undermining: Yes Starting Position (o'clock): 10 Ending Position (o'clock): 3 Maximum Distance: (cm) 0.4 Wound Description Classification: Full Thickness Without Exposed Suppor Wound Margin: Distinct, outline attached Exudate Amount: Medium Exudate Type: Serosanguineous Exudate Color: red, brown t Structures Foul Odor After Cleansing: No Slough/Fibrino Yes Wound Bed Granulation Amount: Small (1-33%) Exposed Structure Granulation Quality: Red Fascia Exposed: No Necrotic Amount: Large (67-100%) Fat Layer (Subcutaneous Tissue) Exposed: Yes Necrotic Quality: Eschar, Adherent Slough Tendon Exposed: No Muscle Exposed: No Joint Exposed: No [name], [name] ([id number]) [id number] Page 7 of 8 Bone Exposed: No Periwound Skin Texture Texture Color No Abnormalities Noted: No No Abnormalities Noted: No Atrophie Blanche: Yes Moisture Ecchymosis: No No Abnormalities Noted: No Erythema: No Rubor: Yes Temperature / Pain Temperature: No Abnormality Treatment Notes Wound #2 (Lower Leg) Wound Laterality: Right, Posterior Cleanser Soap and Water Discharge Instruction: May shower and wash wound with dial antibacterial soap and water prior to dressing change. Wound Cleanser Discharge Instruction: Cleanse the wound with wound cleanser prior to applying a clean dressing using gauze sponges, not tissue or cotton balls. Peri-Wound Care Sween Lotion (Moisturizing lotion) Discharge Instruction:  Apply moisturizing lotion as directed Topical Triamcinolone Discharge Instruction: Apply Triamcinolone as directed Primary Dressing IODOFLEX 0.9% Cadexomer Iodine Pad 4x6 cm Discharge Instruction: Apply to wound bed as instructed Secondary Dressing Woven Gauze Sponge, Non-Sterile 4x4 in Discharge Instruction: Apply over primary dressing as directed. Secured With Transpore Surgical Tape, 2x10 (in/yd) Discharge Instruction: Secure dressing with tape as directed. Compression Wrap ThreePress (3  layer compression wrap) Discharge Instruction: Apply three layer compression as directed. Compression Stockings Add-Ons Electronic Signature(s) Signed: 02/15/2022 11:52:26 AM By: Karl Ito Signed: 02/15/2022 1:23:48 PM By: Karie Schwalbe RN Entered By: Karl Ito on 02/15/2022 10:45:00 -------------------------------------------------------------------------------- Vitals Details Patient Name: Date of Service: [name], [name] [name] H. 02/15/2022 10:15 A M Medical Record Number: [id number] Patient Account Number: [id number] Date of Birth/Sex: Treating RN: Nov 24, 1960 (61 y.o. F) Primary Care Yariah Selvey: Theodis Shove Other Clinician: Referring Sadako Cegielski: Treating Gisselle Galvis/Extender: Beryle Flock Weeks in Treatment: 3 Vital Signs [name], [name] ([id number]) [id number] Page 8 of 8 Time Taken: 10:30 Temperature (F): 98.0 Pulse (bpm): 85 Respiratory Rate (breaths/min): 20 Blood Pressure (mmHg): 141/84 Reference Range: 80 - 120 mg / dl Electronic Signature(s) Signed: 02/15/2022 12:39:03 PM By: Dayton Scrape Entered By: Dayton Scrape on 02/15/2022 10:34:03

## 2022-02-18 ENCOUNTER — Ambulatory Visit: Payer: BC Managed Care – PPO | Admitting: Family Medicine

## 2022-02-18 ENCOUNTER — Encounter: Payer: Self-pay | Admitting: Family Medicine

## 2022-02-18 VITALS — BP 128/88 | HR 97 | Temp 98.0°F | Ht 64.0 in | Wt 274.5 lb

## 2022-02-18 DIAGNOSIS — E039 Hypothyroidism, unspecified: Secondary | ICD-10-CM | POA: Diagnosis not present

## 2022-02-18 DIAGNOSIS — Z23 Encounter for immunization: Secondary | ICD-10-CM

## 2022-02-18 DIAGNOSIS — R600 Localized edema: Secondary | ICD-10-CM

## 2022-02-18 DIAGNOSIS — M545 Low back pain, unspecified: Secondary | ICD-10-CM

## 2022-02-18 DIAGNOSIS — I10 Essential (primary) hypertension: Secondary | ICD-10-CM | POA: Diagnosis not present

## 2022-02-18 DIAGNOSIS — M810 Age-related osteoporosis without current pathological fracture: Secondary | ICD-10-CM

## 2022-02-18 LAB — POCT URINALYSIS DIPSTICK
Bilirubin, UA: NEGATIVE
Blood, UA: NEGATIVE
Glucose, UA: NEGATIVE
Ketones, UA: NEGATIVE
Leukocytes, UA: NEGATIVE
Nitrite, UA: NEGATIVE
Protein, UA: NEGATIVE
Spec Grav, UA: 1.025 (ref 1.010–1.025)
Urobilinogen, UA: 0.2 E.U./dL
pH, UA: 6 (ref 5.0–8.0)

## 2022-02-18 MED ORDER — ALENDRONATE SODIUM 70 MG PO TABS: 70.0000 mg | ORAL_TABLET | ORAL | 3 refills | Status: DC

## 2022-02-18 MED ORDER — LIDOCAINE 5 % EX PTCH: 1.0000 | MEDICATED_PATCH | CUTANEOUS | 1 refills | Status: DC

## 2022-02-18 MED ORDER — LEVOTHYROXINE SODIUM 175 MCG PO TABS: ORAL_TABLET | ORAL | 1 refills | Status: DC

## 2022-02-18 MED ORDER — FUROSEMIDE 20 MG PO TABS: 20.0000 mg | ORAL_TABLET | Freq: Every day | ORAL | 1 refills | Status: DC

## 2022-02-18 MED ORDER — LOSARTAN POTASSIUM 100 MG PO TABS: ORAL_TABLET | ORAL | 1 refills | Status: DC

## 2022-02-18 MED ORDER — HYDROCHLOROTHIAZIDE 50 MG PO TABS: 50.0000 mg | ORAL_TABLET | Freq: Every day | ORAL | 1 refills | Status: DC

## 2022-02-18 NOTE — Patient Instructions (Addendum)
Heating pads Muscle rubs OTC Continue the naproxen 2 tablets in the morning and 2 tablets at night

## 2022-02-18 NOTE — Progress Notes (Signed)
Established Patient Office Visit  Subjective   Patient ID: [name], female    DOB: October 03, 1960  Age: 61 y.o. MRN: [id number]  Chief Complaint  Patient presents with  . Establish Care  . Back Pain    Patient complains of right sided low back pain x2 weeks, no known injury and states she suspected this could be due gait being altered as she is wearing a post-op shoe on the right foot due to wound    Patient reports she is having a flare up of her back pain. States that she is in a boot on the right for a wound on her lower leg. States that the right lower area of her back, states that she has not had any blood in her urine. States that it is worse with bendind, twisting and walking, improves with sitting and laying down. Denies any fever/chills. She reports that she has a history of kidney stones and is concerned that she might have another one. Is urinating well.   Back Pain  Current Outpatient Medications  Medication Instructions  . acetaminophen (TYLENOL) 500 mg, Oral, As needed  . alendronate (FOSAMAX) 70 mg, Oral, Every 7 days, Take with a full glass of water on an empty stomach.  . Calcium Carb-Cholecalciferol (CALCIUM 600 + D) 600-200 MG-UNIT TABS 1 tablet, Oral, Daily  . diphenhydrAMINE (BENADRYL) 25 mg, Oral, As needed  . diphenhydramine-acetaminophen (TYLENOL PM) 25-500 MG TABS tablet 1 tablet, Oral, At bedtime PRN  . ELDERBERRY PO Oral  . famotidine (PEPCID) 20 mg, Oral, Daily PRN  . Ferrous Sulfate Dried (FERROUS SULFATE IRON) 200 (65 Fe) MG TABS 1 tablet, Oral, Daily  . furosemide (LASIX) 20 mg, Oral, Daily  . hydrochlorothiazide (HYDRODIURIL) 50 mg, Oral, Daily  . levothyroxine (SYNTHROID) 175 MCG tablet TAKE 1 TABLET BY MOUTH ONCE DAILY BEFORE BREAKFAST. APPOINTMENT REQUIRED FOR FUTURE REFILLS  . lidocaine (LIDODERM) 5 % 1 patch, Transdermal, Every 24 hours, Remove & Discard patch within 12 hours or as directed by MD  . losartan (COZAAR) 100 MG tablet TAKE 1  TABLET BY MOUTH ONCE DAILY . APPOINTMENT REQUIRED FOR FUTURE REFILLS  . mirabegron ER (MYRBETRIQ) 25 mg, Oral, Daily  . Multiple Vitamins-Minerals (ONE-A-DAY MENOPAUSE FORMULA) TABS 1 tablet, Oral, Daily  . naproxen sodium (ALEVE) 220 mg, Oral, 2 times daily PRN  . OVER THE COUNTER MEDICATION OTC antacid as needed (cannot recall name)   . vitamin B-12 (CYANOCOBALAMIN) 500 mcg, Oral, Daily    Patient Active Problem List   Diagnosis Date Noted  . Dyspnea on exertion 01/12/2019  . Hypertension 11/27/2018  . Hypothyroid 11/27/2018  . OSA on CPAP 11/27/2018      Review of Systems  Musculoskeletal:  Positive for back pain.  All other systems reviewed and are negative.     Objective:     BP 128/88 (BP Location: Left Arm, Patient Position: Sitting, Cuff Size: Large)   Pulse 97   Temp 98 F (36.7 C) (Oral)   Ht 5\' 4"  (1.626 m)   Wt 274 lb 8 oz (124.5 kg)   SpO2 99%   BMI 47.12 kg/m  {Vitals History (Optional):23777}  Physical Exam Vitals reviewed.  Constitutional:      Appearance: Normal appearance. She is well-groomed. She is morbidly obese.  Eyes:     Conjunctiva/sclera: Conjunctivae normal.  Neck:     Thyroid: No thyromegaly.  Cardiovascular:     Rate and Rhythm: Normal rate and regular rhythm.     Pulses:  Normal pulses.     Heart sounds: S1 normal and S2 normal.  Pulmonary:     Effort: Pulmonary effort is normal.     Breath sounds: Normal breath sounds and air entry.  Abdominal:     General: Bowel sounds are normal.  Musculoskeletal:     Right lower leg: No edema.     Left lower leg: No edema.  Neurological:     Mental Status: She is alert and oriented to person, place, and time. Mental status is at baseline.     Gait: Gait is intact.  Psychiatric:        Mood and Affect: Mood and affect normal.        Speech: Speech normal.        Behavior: Behavior normal.        Judgment: Judgment normal.     No results found for any visits on 02/18/22.  {Labs  (Optional):23779}  The ASCVD Risk score (Arnett DK, et al., 2019) failed to calculate for the following reasons:   Unable to determine if patient is Non-Hispanic African American    Assessment & Plan:   Problem List Items Addressed This Visit       Cardiovascular and Mediastinum   Hypertension   Relevant Medications   losartan (COZAAR) 100 MG tablet   hydrochlorothiazide (HYDRODIURIL) 50 MG tablet   furosemide (LASIX) 20 MG tablet   Other Relevant Orders   CMP   Lipid Panel     Endocrine   Hypothyroid   Relevant Medications   levothyroxine (SYNTHROID) 175 MCG tablet   Other Relevant Orders   TSH   Other Visit Diagnoses     Acute right-sided low back pain without sciatica    -  Primary   Relevant Medications   lidocaine (LIDODERM) 5 %   Other Relevant Orders   POC Urinalysis Dipstick   Lower extremity edema       Relevant Medications   furosemide (LASIX) 20 MG tablet   Immunization due       Relevant Orders   Flu Vaccine QUAD 6+ mos PF IM (Fluarix Quad PF)   Age-related osteoporosis without current pathological fracture       Relevant Medications   alendronate (FOSAMAX) 70 MG tablet       No follow-ups on file.    Karie Georges, MD

## 2022-02-19 ENCOUNTER — Telehealth: Payer: Self-pay | Admitting: *Deleted

## 2022-02-19 NOTE — Telephone Encounter (Signed)
[facility] faxed a prior authorization request for Lidocaine 5% patches.  PA was sent to [email or web address]-Key: [id number] pending review by insurance.

## 2022-02-19 NOTE — Telephone Encounter (Signed)
Fax received from [facility] stating the request was denied and given to PCP for review.   

## 2022-02-20 ENCOUNTER — Other Ambulatory Visit: Payer: Self-pay | Admitting: Family Medicine

## 2022-02-20 DIAGNOSIS — M810 Age-related osteoporosis without current pathological fracture: Secondary | ICD-10-CM | POA: Insufficient documentation

## 2022-02-20 DIAGNOSIS — M545 Low back pain, unspecified: Secondary | ICD-10-CM

## 2022-02-20 MED ORDER — LIDOCAINE 5 % EX OINT: 1.0000 | TOPICAL_OINTMENT | CUTANEOUS | 1 refills | Status: DC | PRN

## 2022-02-20 NOTE — Assessment & Plan Note (Signed)
Chronic, sx well controlled on the current 175 mcg dose of levothyroxine daily. Will order her annual TSH to assess the need for dosage adjustment.

## 2022-02-20 NOTE — Telephone Encounter (Signed)
Ok I called in the lidocaine ointment instead

## 2022-02-20 NOTE — Assessment & Plan Note (Signed)
New diagnosis, patient is agreeable to starting alendronate 70 mg tablets once weekly.

## 2022-02-20 NOTE — Assessment & Plan Note (Signed)
Current hypertension medications:       Sig   furosemide (LASIX) 20 MG tablet Take 1 tablet (20 mg total) by mouth daily.   hydrochlorothiazide (HYDRODIURIL) 50 MG tablet Take 1 tablet (50 mg total) by mouth daily.   losartan (COZAAR) 100 MG tablet TAKE 1 TABLET BY MOUTH ONCE DAILY . APPOINTMENT REQUIRED FOR FUTURE REFILLS      BP is well controlled on the above medications, will continue these as prescribed. Ordering her annual CMP and lipid panel for CVD risk factor stratification.

## 2022-02-20 NOTE — Telephone Encounter (Signed)
Spoke with Huyen at [facility] and informed her of the message below.  She stated they will order the Rx and contact the patient when this is ready for pick up.

## 2022-02-22 ENCOUNTER — Encounter (HOSPITAL_BASED_OUTPATIENT_CLINIC_OR_DEPARTMENT_OTHER): Payer: BC Managed Care – PPO | Admitting: General Surgery

## 2022-02-22 DIAGNOSIS — L97212 Non-pressure chronic ulcer of right calf with fat layer exposed: Secondary | ICD-10-CM | POA: Diagnosis not present

## 2022-02-23 NOTE — Progress Notes (Signed)
[name], [name] ([id number]) [id number] Page 1 of 11 Visit Report for 02/22/2022 Chief Complaint Document Details Patient Name: Date of Service: [name], [name]. 02/22/2022 3:30 PM Medical Record Number: [id number] Patient Account Number: [id number] Date of Birth/Sex: Treating RN: 1961-04-06 (61 y.o. F) Primary Care Provider: Gerilyn Pilgrim, [facility] RBA RA Other Clinician: Referring Provider: Treating Provider/Extender: Beryle Flock Weeks in Treatment: 4 Information Obtained from: Patient Chief Complaint RLE ulcer Electronic Signature(s) Signed: 02/22/2022 5:00:49 PM By: [name] MD FACS Entered By: [name] on 02/22/2022 17:00:49 -------------------------------------------------------------------------------- Debridement Details Patient Name: Date of Service: [name], [name] [name] 02/22/2022 3:30 PM Medical Record Number: [id number] Patient Account Number: [id number] Date of Birth/Sex: Treating RN: Oct 14, 1960 (61 y.o. [name] Primary Care Provider: Gerilyn Pilgrim, [facility] RBA RA Other Clinician: Referring Provider: Treating Provider/Extender: Beryle Flock Weeks in Treatment: 4 Debridement Performed for Assessment: Wound #2 Right,Posterior Lower Leg Performed By: Physician [name], MD Debridement Type: Debridement Severity of Tissue Pre Debridement: Fat layer exposed Level of Consciousness (Pre-procedure): Awake and Alert Pre-procedure Verification/Time Out Yes - 15:55 Taken: Start Time: 15:58 Pain Control: Lidocaine 4% T opical Solution T Area Debrided (L x W): otal 2.3 (cm) x 2.8 (cm) = 6.44 (cm) Tissue and other material debrided: Non-Viable, Slough, Subcutaneous, Slough Level: Skin/Subcutaneous Tissue Debridement Description: Excisional Instrument: Curette Specimen: Tissue Culture Number of Specimens T aken: 1 Bleeding: Minimum Hemostasis Achieved: Pressure Procedural Pain:  8 Post Procedural Pain: 5 Response to Treatment: Procedure was tolerated well Level of Consciousness (Post- Awake and Alert procedure): Post Debridement Measurements of Total Wound Length: (cm) 2.3 Width: (cm) 2.8 Depth: (cm) 0.6 Volume: (cm) 3.035 Character of Wound/Ulcer Post Debridement: Requires Further Debridement [name], [name] ([id number]) [id number] Page 2 of 11 Severity of Tissue Post Debridement: Fat layer exposed Post Procedure Diagnosis Same as Pre-procedure Notes scribed for Dr. Lady [name] by [name] Deed, RN Electronic Signature(s) Signed: 02/22/2022 5:13:10 PM By: [name] MD FACS Signed: 02/22/2022 5:40:17 PM By: [name] Deed RN, BSN Entered By: [name] on 02/22/2022 16:30:15 -------------------------------------------------------------------------------- HPI Details Patient Name: Date of Service: [name], [name] [name] 02/22/2022 3:30 PM Medical Record Number: [id number] Patient Account Number: [id number] Date of Birth/Sex: Treating RN: 01/17/1961 (61 y.o. F) Primary Care Provider: Gerilyn Pilgrim, [facility] RBA RA Other Clinician: Referring Provider: Treating Provider/Extender: Beryle Flock Weeks in Treatment: 4 History of Present Illness HPI Description: 02/16/2020 upon evaluation today patient actually appears to be doing poorly in regard to her left medial lower extremity ulcer. This is actually an area that she tells me she has had intermittent issues with over the years although has been closed for some time she typically uses compression right now she has juxta lite compression wraps. With that being said she tells me that this nonetheless open several weeks/months ago and has been given her trouble since. She does have a history of chronic venous insufficiency she is seeing specialist for this in the past she has had an ablation as well as sclerotherapy. With that being said she also has hypertension  chronically which is managed by her primary care provider. In general she seems to be worsening overall with regard to the wound and states that she finally realized that she needed to come in and have somebody look at this and not continue to try to manage this on her own. No fevers, chills, nausea, vomiting, or diarrhea. 02/23/2020 on evaluation today patient appears to be doing well with  regard to her wound. This is showing some signs of improvement which is great news still were not quite at the point where I would like to be as far as the overall appearance of the wound is concerned but I do believe this is better than last week. I do believe the Iodoflex is helping as well. 03/08/2020 upon evaluation today patient appears to be doing well with regard to her wound. She has been tolerating the dressing changes without complication. Fortunately I feel like she has made great progress with the Iodoflex but I feel like it may be the point rest to switch to something else possibly a collagen- based dressing at this time. 03/15/2020 upon evaluation today patient appears to be doing excellent in regard to her leg ulcer. She has been tolerating the dressing changes without complication. Fortunately there is no signs of active infection. Overall she is measuring a little bit smaller today which is great news. 03/22/2020 upon evaluation today patient appears to be doing well with regard to her wound. She has been tolerating the dressing changes without complication. Fortunately there is no signs of active infection at this time. 03/28/2020; patient I do not usually see however she has a wound on the left anterior lower leg secondary to chronic venous insufficiency we have been using silver collagen under compression. She arrives in clinic with a nonviable surface requiring debridement 04/12/2020 upon evaluation today patient appears to be doing well all things considered with regard to her leg ulcer. She is  tolerating the dressing changes without complication there is minimal dry skin around the edges of the wound that may be trapping and stopping some of the events of the new skin I am can work on that today. Otherwise the surface of the wound appears to be doing excellent. 04/19/2020 upon evaluation today patient appears to be doing well with regard to her leg ulcer. She has been tolerating dressing changes without complication. Fortunately there is no signs of active infection at this time. No fever chills noted overall very pleased with how things seem to be progressing. 04/26/2020 on evaluation today patient appears to be doing well with regard to her wound currently. Is showing signs of excellent improvement overall is filling in nicely and there does not appear to be any signs of infection. No fevers, chills, nausea, vomiting, or diarrhea. 05/03/2020 upon evaluation today patient appears to be doing well with regard to her wound on the leg. This overall showing signs of good improvement which is great she has some good epithelial growth and overall I think that things are moving in the correct direction. We likewise going to continue with the wound care measures as before since she seems to making such good improvement. 2/3; venous wound on the left medial leg. This is contracting. We are using Prisma and 3 layer compression. She has a stocking and waiting in the eventuality this heals. She is already using it on the right 05/17/2020 upon evaluation today patient appears to be doing well with regard to her leg ulcer. She has been tolerating the dressing changes without complication. Fortunately there is no signs of active infection which is great news and overall very pleased with where things stand today. No fevers, chills, nausea, vomiting, or diarrhea. 05/24/2020 upon evaluation today patient appears to be doing well with regard to her wound. Overall I feel like she is making excellent progress.  There does not appear to be any signs of active infection which is great  news. 05/31/2020 upon evaluation today patient appears to be doing well with regard to her wound. There does not appear to be any signs of active infection which is great news overall I am extremely pleased with where things stand today. [name], [name] ([id number]) [id number] Page 3 of 11 READMISSION 01/23/2022 She returns to clinic today with a new wound on her right posterior calf. She says that she was cleaning out an old shed near the middle of August this year and then noticed what seemed to be a bug bite on her right posterior calf. It was itchy, red, and raised. By the end of September, an ulcer had developed. She has been applying various topical creams such as hydrocortisone and others to the site. When it was not improving, she made an appointment in the wound care center. ABI in clinic today was 0.97. On her right posterior calf, there is a circular wound with necrotic fat and black eschar present. There is no purulent drainage or malodor. The periwound skin is in good condition with just a little induration that appears to be secondary to inflammation. 01/31/2022: The wound measures a little bit larger today, but overall is quite a bit cleaner. There is some undermining from 9:00 to 3:00. She is having some periwound itching and says that her wrap slid. 02/08/2022: Despite using an Unna boot first layer at the top of her wrap, it slid again and it looks like there is been some bruising at the wound site. The wound is about the same size in terms of dimensions. There is a fair amount of slough and nonviable tissue still present. Edema control is better than last week. 02/15/2022: The wound dimensions are about the same. There is less nonviable tissue present. The periwound erythema has improved. 02/22/2022: The wound has deteriorated over the past week. It is larger and the periwound is  more edematous, erythematous, and indurated. It looks as though there has been more tissue breakdown with undermining present. She is having more pain. There is a foul odor coming from the wound. Electronic Signature(s) Signed: 02/22/2022 5:01:42 PM By: [name] MD FACS Entered By: [name] on 02/22/2022 17:01:41 -------------------------------------------------------------------------------- Physical Exam Details Patient Name: Date of Service: [name], SURA [name] 02/22/2022 3:30 PM Medical Record Number: [id number] Patient Account Number: [id number] Date of Birth/Sex: Treating RN: 1960/11/03 (61 y.o. F) Primary Care Provider: Gerilyn Pilgrim, [facility] RBA RA Other Clinician: Referring Provider: Treating Provider/Extender: Beryle Flock Weeks in Treatment: 4 Constitutional Hypertensive, asymptomatic. Slightly tachycardic, asymptomatic. . . No acute distress.Marland Kitchen Respiratory Normal work of breathing on room air.. Notes 02/22/2022: The wound has deteriorated over the past week. It is larger and the periwound is more edematous, erythematous, and indurated. It looks as though there has been more tissue breakdown with undermining present. She is having more pain. There is a foul odor coming from the wound. Electronic Signature(s) Signed: 02/22/2022 5:05:48 PM By: [name] MD FACS Entered By: [name] on 02/22/2022 17:05:47 -------------------------------------------------------------------------------- Physician Orders Details Patient Name: Date of Service: [name], [name] [name] 02/22/2022 3:30 PM Medical Record Number: [id number] Patient Account Number: [id number] Date of Birth/Sex: Treating RN: 19-Oct-1960 (61 y.o. 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 - Dr. Lady [name] Return A ppointment in: - Dr. Lady [name] RM 3 Return A Tuesday 11/21 @ 0754 am Anesthetic (In clinic) Topical Lidocaine 4% applied to wound bed Bathing/ Shower/ Hygiene May shower with protection but do not get wound dressing(s) wet. Edema Control - Lymphedema / SCD / Other Bilateral Lower Extremities Elevate legs to the level of the heart or above for 30 minutes daily and/or when sitting, a frequency of: - throughout the day Avoid standing for long periods of time. Exercise regularly Moisturize legs daily. Compression stocking or Garment 20-30 mm/Hg pressure to: - left leg daily. Apply first thing in the morning, remove at night. Wound Treatment Wound #2 - Lower Leg Wound Laterality: Right, Posterior Cleanser: Soap and Water 1 x Per Week/30 Days Discharge Instructions: May shower and wash wound with dial antibacterial soap and water prior to dressing change. Cleanser: Wound Cleanser 1 x Per Week/30 Days Discharge Instructions: Cleanse the wound with wound cleanser prior to applying a clean dressing using gauze sponges, not tissue or cotton balls. Peri-Wound Care: Sween Lotion (Moisturizing lotion) 1 x Per Week/30 Days Discharge Instructions: Apply moisturizing lotion as directed Topical: Triamcinolone 1 x Per Week/30 Days Discharge Instructions: Apply Triamcinolone as directed Prim Dressing: Anasept Antimicrobial Wound Gel, 1.5 (oz) tube 1 x Per Week/30 Days ary Discharge Instructions: moisten gauze with anasept and pack into wound Secondary Dressing: Woven Gauze Sponge, Non-Sterile 4x4 in 1 x Per Week/30 Days Discharge Instructions:  Apply over primary dressing as directed. Compression Wrap: ThreePress (3 layer compression wrap) 1 x Per Week/30 Days Discharge Instructions: Apply three layer compression as directed. Compression Wrap: Unnaboot w/Calamine, 4x10 (in/yd) 1 x Per Week/30 Days Discharge Instructions: Apply Unnaboot at top of leg Laboratory naerobe culture (MICRO) Bacteria identified in Unspecified specimen by A LOINC Code: 635-3 Convenience Name: Anaerobic culture Patient Medications llergies: Iodinated Contrast Media, Sulfa (Sulfonamide Antibiotics) A Notifications Medication Indication Start End prior to debridement 02/22/2022 lidocaine DOSE topical 4 % cream - cream topical 02/22/2022 amoxicillin-pot clavulanate DOSE oral 875 mg-125 mg tablet - 1 tab p.o. twice daily x10 days Electronic Signature(s) Signed: 02/22/2022 5:07:26 PM By: [name] MD FACS Entered By: [name] on 02/22/2022 17:07:26 [name] ([id number]) [id number] Page 5 of 11 -------------------------------------------------------------------------------- Problem List Details Patient Name: Date of Service: [name] 02/22/2022 3:30 PM Medical Record Number: [id number] Patient Account Number: [id number] Date of Birth/Sex: Treating RN: [birth year] (61 y.o. [name] Primary Care Provider: Gerilyn Pilgrim, [facility] RBA RA Other Clinician: Referring Provider: Treating Provider/Extender: Beryle Flock Weeks in Treatment: 4 Active Problems ICD-10 Encounter Code Description Active Date MDM Diagnosis L97.212 Non-pressure chronic ulcer of right calf with fat layer exposed 01/23/2022 No Yes E66.01 Morbid (severe) obesity due to excess calories 01/23/2022 No Yes I10 Essential (primary) hypertension 01/23/2022 No Yes R60.0 Localized edema 01/23/2022 No Yes Inactive Problems Resolved Problems Electronic Signature(s) Signed: 02/22/2022 5:00:28 PM By: [name] MD FACS Entered By: [name] on 02/22/2022 17:00:28 -------------------------------------------------------------------------------- Progress Note Details Patient Name: Date of Service: [name], SURA [name] 02/22/2022 3:30 PM Medical Record Number: [id number] Patient Account Number: [id number] Date of Birth/Sex: Treating RN: 1960-05-04 (61 y.o. F) Primary Care Provider: Gerilyn Pilgrim, [facility] RBA RA Other Clinician: Referring Provider: Treating Provider/Extender: Beryle Flock Weeks in Treatment: 4 Subjective Chief Complaint Information  obtained from Patient RLE ulcer History of Present Illness (HPI) 02/16/2020 upon evaluation today patient actually appears to be doing poorly in regard to her left medial lower extremity ulcer. This is actually an area that she tells me she has had intermittent issues with over the years although has been closed for some time she typically uses compression right now she has juxta lite compression wraps. With that being said she tells me that this nonetheless open several weeks/months ago and has been given her trouble since. She does have a history of chronic venous insufficiency she is seeing specialist for this in the past she has had an ablation as well as sclerotherapy. With that being said she also has hypertension chronically which is managed by her primary care provider. In general she seems to be worsening overall with regard to the [name], [name] ([id number]) [id number] Page 6 of 11 wound and states that she finally realized that she needed to come in and have somebody look at this and not continue to try to manage this on her own. No fevers, chills, nausea, vomiting, or diarrhea. 02/23/2020 on evaluation today patient appears to be doing well with regard to her wound. This is showing some signs of improvement which is great news still were not quite at the point where I would like to be as  far as the overall appearance of the wound is concerned but I do believe this is better than last week. I do believe the Iodoflex is helping as well. 03/08/2020 upon evaluation today patient appears to be doing well with regard to her wound. She has been tolerating the dressing changes without complication. Fortunately I feel like she has made great progress with the Iodoflex but I feel like it may be the point rest to switch to something else possibly a collagen- based dressing at this time. 03/15/2020 upon evaluation today patient appears to be doing excellent in regard to her leg ulcer. She has been tolerating the dressing changes without complication. Fortunately there is no signs of active infection. Overall she is measuring a little bit smaller today which is great news. 03/22/2020 upon evaluation today patient appears to be doing well with regard to her wound. She has been tolerating the dressing changes without complication. Fortunately there is no signs of active infection at this time. 03/28/2020; patient I do not usually see however she has a wound on the left anterior lower leg secondary to chronic venous insufficiency we have been using silver collagen under compression. She arrives in clinic with a nonviable surface requiring debridement 04/12/2020 upon evaluation today patient appears to be doing well all things considered with regard to her leg ulcer. She is tolerating the dressing changes without complication there is minimal dry skin around the edges of the wound that may be trapping and stopping some of the events of the new skin I am can work on that today. Otherwise the surface of the wound appears to be doing excellent. 04/19/2020 upon evaluation today patient appears to be doing well with regard to her leg ulcer. She has been tolerating dressing changes without complication. Fortunately there is no signs of active infection at this time. No fever chills noted overall very pleased  with how things seem to be progressing. 04/26/2020 on evaluation today patient appears to be doing well with regard to her wound currently. Is showing signs of excellent improvement overall is filling in nicely and there does not appear to be any signs of infection. No  fevers, chills, nausea, vomiting, or diarrhea. 05/03/2020 upon evaluation today patient appears to be doing well with regard to her wound on the leg. This overall showing signs of good improvement which is great she has some good epithelial growth and overall I think that things are moving in the correct direction. We likewise going to continue with the wound care measures as before since she seems to making such good improvement. 2/3; venous wound on the left medial leg. This is contracting. We are using Prisma and 3 layer compression. She has a stocking and waiting in the eventuality this heals. She is already using it on the right 05/17/2020 upon evaluation today patient appears to be doing well with regard to her leg ulcer. She has been tolerating the dressing changes without complication. Fortunately there is no signs of active infection which is great news and overall very pleased with where things stand today. No fevers, chills, nausea, vomiting, or diarrhea. 05/24/2020 upon evaluation today patient appears to be doing well with regard to her wound. Overall I feel like she is making excellent progress. There does not appear to be any signs of active infection which is great news. 05/31/2020 upon evaluation today patient appears to be doing well with regard to her wound. There does not appear to be any signs of active infection which is great news overall I am extremely pleased with where things stand today. READMISSION 01/23/2022 She returns to clinic today with a new wound on her right posterior calf. She says that she was cleaning out an old shed near the middle of August this year and then noticed what seemed to be a bug bite on  her right posterior calf. It was itchy, red, and raised. By the end of September, an ulcer had developed. She has been applying various topical creams such as hydrocortisone and others to the site. When it was not improving, she made an appointment in the wound care center. ABI in clinic today was 0.97. On her right posterior calf, there is a circular wound with necrotic fat and black eschar present. There is no purulent drainage or malodor. The periwound skin is in good condition with just a little induration that appears to be secondary to inflammation. 01/31/2022: The wound measures a little bit larger today, but overall is quite a bit cleaner. There is some undermining from 9:00 to 3:00. She is having some periwound itching and says that her wrap slid. 02/08/2022: Despite using an Unna boot first layer at the top of her wrap, it slid again and it looks like there is been some bruising at the wound site. The wound is about the same size in terms of dimensions. There is a fair amount of slough and nonviable tissue still present. Edema control is better than last week. 02/15/2022: The wound dimensions are about the same. There is less nonviable tissue present. The periwound erythema has improved. 02/22/2022: The wound has deteriorated over the past week. It is larger and the periwound is more edematous, erythematous, and indurated. It looks as though there has been more tissue breakdown with undermining present. She is having more pain. There is a foul odor coming from the wound. Patient History Information obtained from Patient. Family History Cancer - Mother, Diabetes - Father, Hypertension - Mother, Kidney Disease - Mother, Lung Disease - Father, Thyroid Problems - Paternal Grandparents, No family history of Heart Disease, Seizures, Stroke, Tuberculosis. Social History Never smoker, Marital Status - Married, Alcohol Use - Never, Drug Use -  No History, Caffeine Use - Daily - coffee. Medical  History Eyes Denies history of Cataracts, Glaucoma, Optic Neuritis Ear/Nose/Mouth/Throat Denies history of Chronic sinus problems/congestion, Middle ear problems Hematologic/Lymphatic Patient has history of Anemia - iron Denies history of Hemophilia, Human Immunodeficiency Virus, Lymphedema, Sickle Cell Disease Respiratory Patient has history of Sleep Apnea - CPAP Denies history of Aspiration, Asthma, Chronic Obstructive Pulmonary Disease (COPD), Pneumothorax, Tuberculosis Cardiovascular Patient has history of Hypertension, Peripheral Venous Disease Denies history of Angina, Arrhythmia, Congestive Heart Failure, Coronary Artery Disease, Deep Vein Thrombosis, Hypotension, Myocardial Infarction, Peripheral Arterial Disease, Phlebitis, Vasculitis Gastrointestinal Denies history of Cirrhosis , Colitis, Crohnoos, Hepatitis A, Hepatitis B, Hepatitis LARISSA, PEGG ([id number]) [id number] Page 7 of 11 Endocrine Denies history of Type I Diabetes, Type II Diabetes Genitourinary Denies history of End Stage Renal Disease Immunological Denies history of Lupus Erythematosus, Raynaudoos, Scleroderma Integumentary (Skin) Denies history of History of Burn Musculoskeletal Denies history of Gout, Rheumatoid Arthritis, Osteoarthritis, Osteomyelitis Neurologic Denies history of Dementia, Neuropathy, Quadriplegia, Paraplegia, Seizure Disorder Oncologic Denies history of Received Chemotherapy, Received Radiation Psychiatric Denies history of Anorexia/bulimia, Confinement Anxiety Hospitalization/Surgery History - cholecystectomy 1980s. - nephrolithasis 1980s. - plate and rod in right elbow surgery 2010. 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 Slightly tachycardic, asymptomatic. No acute distress.. Vitals Time Taken: 3:36 PM,  Temperature: 98.4 F, Pulse: 102 bpm, Respiratory Rate: 20 breaths/min, Blood Pressure: 160/90 mmHg. Respiratory Normal work of breathing on room air.. General Notes: 02/22/2022: The wound has deteriorated over the past week. It is larger and the periwound is more edematous, erythematous, and indurated. It looks as though there has been more tissue breakdown with undermining present. She is having more pain. There is a foul odor coming from the wound. Integumentary (Hair, Skin) Wound #2 status is Open. Original cause of wound was Insect Bite. The date acquired was: 11/21/2021. The wound has been in treatment 4 weeks. The wound is located on the Right,Posterior Lower Leg. The wound measures 2.3cm length x 2.8cm width x 0.6cm depth; 5.058cm^2 area and 3.035cm^3 volume. There is Fat Layer (Subcutaneous Tissue) exposed. There is no tunneling or undermining noted. There is a medium amount of serosanguineous drainage noted. The wound margin is distinct with the outline attached to the wound base. There is small (1-33%) red granulation within the wound bed. There is a large (67-100%) amount of necrotic tissue within the wound bed including Eschar and Adherent Slough. The periwound skin appearance had no abnormalities noted for moisture. The periwound skin appearance exhibited: Excoriation, Rubor, Erythema. The periwound skin appearance did not exhibit: Atrophie Blanche, Ecchymosis. The surrounding wound skin color is noted with erythema. Periwound temperature was noted as No Abnormality. Assessment Active Problems ICD-10 Non-pressure chronic ulcer of right calf with fat layer exposed Morbid (severe) obesity due to excess calories Essential (primary) hypertension Localized edema Procedures Wound #2 Pre-procedure diagnosis of Wound #2 is a Venous Leg Ulcer located on the Right,Posterior Lower Leg .Severity of Tissue Pre Debridement is: Fat layer exposed. There was a Excisional Skin/Subcutaneous Tissue  Debridement with a total area of 6.44 sq cm performed by [name], MD. With the following instrument(s): Curette to remove Non-Viable tissue/material. Material removed includes Subcutaneous Tissue and Slough and after achieving pain control using Lidocaine 4% T opical Solution. 1 specimen was taken by a Tissue Culture and sent to the lab per facility protocol. A time out was conducted at  15:55, prior to the start of the procedure. A Minimum amount of bleeding was controlled with Pressure. The procedure was tolerated well with a pain level of 8 throughout and a pain level of 5 following the procedure. Post Debridement Measurements: 2.3cm length x 2.8cm width x 0.6cm depth; 3.035cm^3 volume. Character of Wound/Ulcer Post Debridement requires further debridement. Severity of Tissue Post Debridement is: Fat layer exposed. Post procedure Diagnosis Wound #2: Same as Pre-Procedure [name], [name] ([id number]) [id number] Page 8 of 11 General Notes: scribed for Dr. Lady [name] by [name] Deed, RN. Pre-procedure diagnosis of Wound #2 is a Venous Leg Ulcer located on the Right,Posterior Lower Leg . There was a Three Layer Compression Therapy Procedure by [name] Deed, RN. Post procedure Diagnosis Wound #2: Same as Pre-Procedure Plan Follow-up Appointments: Return Appointment in 2 weeks. - Dr. Lady [name] Return Appointment in: - Dr. Lady [name] RM 3 Tuesday 11/21 @ 0754 am Anesthetic: (In clinic) Topical Lidocaine 4% applied to wound bed Bathing/ Shower/ Hygiene: May shower with protection but do not get wound dressing(s) wet. Edema Control - Lymphedema / SCD / Other: Elevate legs to the level of the heart or above for 30 minutes daily and/or when sitting, a frequency of: - throughout the day Avoid standing for long periods of time. Exercise regularly Moisturize legs daily. Compression stocking or Garment 20-30 mm/Hg pressure to: - left leg daily. Apply first thing in the  morning, remove at night. Laboratory ordered were: Anaerobic culture The following medication(s) was prescribed: lidocaine topical 4 % cream cream topical for prior to debridement was prescribed at facility amoxicillin-pot clavulanate oral 875 mg-125 mg tablet 1 tab p.o. twice daily x10 days starting 02/22/2022 WOUND #2: - Lower Leg Wound Laterality: Right, Posterior Cleanser: Soap and Water 1 x Per Week/30 Days Discharge Instructions: May shower and wash wound with dial antibacterial soap and water prior to dressing change. Cleanser: Wound Cleanser 1 x Per Week/30 Days Discharge Instructions: Cleanse the wound with wound cleanser prior to applying a clean dressing using gauze sponges, not tissue or cotton balls. Peri-Wound Care: Sween Lotion (Moisturizing lotion) 1 x Per Week/30 Days Discharge Instructions: Apply moisturizing lotion as directed Topical: Triamcinolone 1 x Per Week/30 Days Discharge Instructions: Apply Triamcinolone as directed Prim Dressing: Anasept Antimicrobial Wound Gel, 1.5 (oz) tube 1 x Per Week/30 Days ary Discharge Instructions: moisten gauze with anasept and pack into wound Secondary Dressing: Woven Gauze Sponge, Non-Sterile 4x4 in 1 x Per Week/30 Days Discharge Instructions: Apply over primary dressing as directed. Com pression Wrap: ThreePress (3 layer compression wrap) 1 x Per Week/30 Days Discharge Instructions: Apply three layer compression as directed. Com pression Wrap: Unnaboot w/Calamine, 4x10 (in/yd) 1 x Per Week/30 Days Discharge Instructions: Apply Unnaboot at top of leg 02/22/2022: The wound has deteriorated over the past week. It is larger and the periwound is more edematous, erythematous, and indurated. It looks as though there has been more tissue breakdown with undermining present. She is having more pain. There is a foul odor coming from the wound. I used a curette to debride slough and nonviable subcutaneous tissue from her wound. I then took a  culture. I empirically prescribed Augmentin and once I have culture data back, I will tailor antibiotic therapy appropriately. We are going to simply pack the wound with Anasept moistened gauze. Continue 3 layer compression. She will have a nurse visit next week and see me in 2 weeks. Electronic Signature(s) Signed: 02/22/2022 5:10:15 PM By: [name] MD FACS Entered By: [name]  on 02/22/2022 17:10:15 -------------------------------------------------------------------------------- HxROS Details Patient Name: Date of Service: [name], [place] [name] 02/22/2022 3:30 PM Medical Record Number: [id number] Patient Account Number: [id number] Date of Birth/Sex: Treating RN: 08/24/1960 (61 y.o. F) Primary Care Provider: Gerilyn PilgrimMICHA EL, [facility] Other Clinician: Referring Provider: Treating Provider/Extender: Vladimir Fasterannon, Nathanyel Defenbaugh Koberlein, Junell Weeks in Treatment: 4 Information Obtained From [name] CorkWATKINS, Haylee [name] ([id number]) [id number] Page 9 of 11 Patient Eyes Medical History: Negative for: Cataracts; Glaucoma; Optic Neuritis Ear/Nose/Mouth/Throat Medical History: Negative for: Chronic sinus problems/congestion; Middle ear problems Hematologic/Lymphatic Medical History: Positive for: Anemia - iron Negative for: Hemophilia; Human Immunodeficiency Virus; Lymphedema; Sickle Cell Disease Respiratory Medical History: Positive for: Sleep Apnea - CPAP Negative for: Aspiration; Asthma; Chronic Obstructive Pulmonary Disease (COPD); Pneumothorax; Tuberculosis Cardiovascular Medical History: Positive for: Hypertension; Peripheral Venous Disease Negative for: Angina; Arrhythmia; Congestive Heart Failure; Coronary Artery Disease; Deep Vein Thrombosis; Hypotension; Myocardial Infarction; Peripheral Arterial Disease; Phlebitis; Vasculitis Gastrointestinal Medical History: Negative for: Cirrhosis ; Colitis; Crohns; Hepatitis A; Hepatitis B; Hepatitis  C Endocrine Medical History: Negative for: Type I Diabetes; Type II Diabetes Genitourinary Medical History: Negative for: End Stage Renal Disease Past Medical History Notes: years ago kidney stones Immunological Medical History: Negative for: Lupus Erythematosus; Raynauds; Scleroderma Integumentary (Skin) Medical History: Negative for: History of Burn Musculoskeletal Medical History: Negative for: Gout; Rheumatoid Arthritis; Osteoarthritis; Osteomyelitis Neurologic Medical History: Negative for: Dementia; Neuropathy; Quadriplegia; Paraplegia; Seizure Disorder Oncologic Medical History: Negative for: Received Chemotherapy; Received Radiation Past Medical History Notes: skin Ca removed from back years ago Psychiatric Medical History: Negative for: [name]; Confinement Anxiety [name], [name] ([id number]) [id number] Page 10 of 11 Immunizations Pneumococcal Vaccine: Received Pneumococcal Vaccination: No Implantable Devices None Hospitalization / Surgery History Type of Hospitalization/Surgery cholecystectomy 1980s nephrolithasis 1980s plate and rod in right elbow surgery 2010 Family and Social History Cancer: Yes - Mother; Diabetes: Yes - Father; Heart Disease: No; Hypertension: Yes - Mother; Kidney Disease: Yes - Mother; Lung Disease: Yes - Father; Seizures: No; Stroke: No; Thyroid Problems: Yes - Paternal Grandparents; Tuberculosis: No; Never smoker; Marital Status - Married; Alcohol Use: Never; Drug Use: No History; Caffeine Use: Daily - coffee; Financial Concerns: No; Food, Clothing or Shelter Needs: No; Support System Lacking: No; Transportation Concerns: No Electronic Signature(s) Signed: 02/22/2022 5:13:10 PM By: Duanne Guessannon, Shiesha Jahn MD FACS Entered By: Duanne Guessannon, Taimur Fier on 02/22/2022 17:04:35 -------------------------------------------------------------------------------- SuperBill Details Patient Name: Date of Service: [name] [place]  [name], SURA [name] 02/22/2022 Medical Record Number: [id number] Patient Account Number: [id number] Date of Birth/Sex: Treating RN: 10/30/1960 (61 y.o. F) Primary Care Provider: Gerilyn PilgrimMICHA EL, BA RBA RA Other Clinician: Referring Provider: Treating Provider/Extender: Beryle Flockannon, Derren Suydam Koberlein, Junell Weeks in Treatment: 4 Diagnosis Coding ICD-10 Codes Code Description [id number] 1896L97.212 Non-pressure chronic ulcer of right calf with fat layer exposed E66.01 Morbid (severe) obesity due to excess calories I10 Essential (primary) hypertension R60.0 Localized edema Facility Procedures : CPT4 Code: [id number] Description: 11042 - DEB SUBQ TISSUE 20 SQ CM/< ICD-10 Diagnosis Description L97.212 Non-pressure chronic ulcer of right calf with fat layer exposed Modifier: Quantity: 1 Physician Procedures : CPT4 Code Description Modifier [id number] 99214 - WC PHYS LEVEL 4 - EST PT 25 ICD-10 Diagnosis Description L97.212 Non-pressure chronic ulcer of right calf with fat layer exposed R60.0 Localized edema E66.01 Morbid (severe) obesity due to excess calories  I10 Essential (primary) hypertension Quantity: 1 : [id number] 11042 - WC PHYS SUBQ TISS 20 SQ CM ICD-10 Diagnosis Description L97.212 Non-pressure chronic ulcer of right calf with fat layer exposed [name] CorkWATKINS, Naijah [name] ([id number]) [id number] Quantity:  1 f Page 11 of 11 Electronic Signature(s) Signed: 02/22/2022 5:10:54 PM By: [name] MD FACS Entered By: [name] on 02/22/2022 17:10:54

## 2022-02-23 NOTE — Progress Notes (Signed)
[name], [name] ([id number]) [id number] Page 1 of 8 Visit Report for 02/22/2022 Arrival Information Details Patient Name: Date of Service: [place], [name]. 02/22/2022 3:30 PM Medical Record Number: [id number] Patient Account Number: [id number] Date of Birth/Sex: Treating RN: Aug 10, 1960 (61 y.o. [name] Primary Care [name]: [name], [facility] RBA RA Other Clinician: Referring [name]: Treating [name]/Extender: [name] in Treatment: 4 Visit Information History Since Last Visit Added or deleted any medications: No Patient Arrived: Ambulatory Any new allergies or adverse reactions: No Arrival Time: 15:34 Had a fall or experienced change in No Accompanied By: self activities of daily living that may affect Transfer Assistance: None risk of falls: Patient Identification Verified: Yes Signs or symptoms of abuse/neglect since last visito No Secondary Verification Process Completed: Yes Hospitalized since last visit: No Patient Requires Transmission-Based Precautions: No Implantable device outside of the clinic excluding No Patient Has Alerts: No cellular tissue based products placed in the center since last visit: Has Dressing in Place as Prescribed: Yes Has Compression in Place as Prescribed: Yes Pain Present Now: No Electronic Signature(s) Signed: 02/22/2022 4:44:53 PM By: [name] Entered By: [name] on 02/22/2022 15:35:53 -------------------------------------------------------------------------------- Compression Therapy Details Patient Name: Date of Service: [name], [name] [name] 02/22/2022 3:30 PM Medical Record Number: [id number] Patient Account Number: [id number] Date of Birth/Sex: Treating RN: 08/11/1960 (61 y.o. [name] Primary Care Darienne Belleau: [name], [facility] RBA RA Other Clinician: Referring Patsie Mccardle: Treating [name]/Extender: [name] in Treatment: 4 Compression Therapy Performed for Wound Assessment: Wound #2 Right,Posterior Lower Leg Performed By: Clinician [name] Deed, RN Compression Type: Three Layer Post Procedure Diagnosis Same as Pre-procedure Electronic Signature(s) Signed: 02/22/2022 5:40:17 PM By: [name] Deed RN, BSN Entered By: [name] on 02/22/2022 16:03:57 [name] ([id number]) [id number] Page 2 of 8 -------------------------------------------------------------------------------- Encounter Discharge Information Details Patient Name: Date of Service: [name] 02/22/2022 3:30 PM Medical Record Number: [id number] Patient Account Number: [id number] Date of Birth/Sex: Treating RN: 06/05/1960 (61 y.o. [name] Primary Care Josphine Laffey: [name], [facility] RBA RA Other Clinician: Referring Antino Mayabb: Treating [name]/Extender: [name] in Treatment: 4 Encounter Discharge Information Items Post Procedure Vitals Discharge Condition: Stable Temperature (F): 98.4 Ambulatory Status: Ambulatory Pulse (bpm): 102 Discharge Destination: Home Respiratory Rate (breaths/min): 18 Transportation: Private Auto Blood Pressure (mmHg): 160/90 Accompanied By: self Schedule Follow-up Appointment: Yes Clinical Summary of Care: Patient Declined Electronic Signature(s) Signed: 02/22/2022 5:40:17 PM By: [name] Deed RN, BSN Entered By: [name] on 02/22/2022 16:29:44 -------------------------------------------------------------------------------- Lower Extremity Assessment Details Patient Name: Date of Service: [place], [name] 02/22/2022 3:30 PM Medical Record Number: [id number] Patient Account Number: [id number] Date of Birth/Sex: Treating RN: July 25, 1960 (61 y.o. [name] Primary Care Deatrice Spanbauer: [name], [facility] RBA RA Other Clinician: Referring Versia Mignogna: Treating [name]/Extender: [name] in Treatment: 4 Edema Assessment Assessed: [Left: No] [Right: No] [Left: Edema] [Right: :] Calf Left: Right: Point of Measurement: From Medial Instep 44 cm Ankle Left: Right: Point of Measurement: From Medial Instep 24 cm Vascular Assessment Pulses: Dorsalis Pedis Palpable: [Right:Yes] Electronic Signature(s) Signed: 02/22/2022 4:44:53 PM By: [name] Entered By: [name] on 02/22/2022 15:43:24 Multi Wound Chart Details -------------------------------------------------------------------------------- [name] ([id number]) [id number] Page 3 of 8 Patient Name: Date of Service: [place], [name] 02/22/2022 3:30 PM Medical Record Number: [id number] Patient Account Number: [id number] Date of Birth/Sex: Treating RN: [birth year] (61 y.o. F)  Primary Care Doniven Vanpatten: [name], [facility] RBA RA Other Clinician: Referring Yamil Dougher: Treating [name]/Extender: [name] in Treatment: 4 Vital Signs Height(in): Pulse(bpm): 102 Weight(lbs): Blood Pressure(mmHg): 160/90 Body Mass Index(BMI): Temperature(F): 98.4 Respiratory Rate(breaths/min): 20 Wound Assessments Wound Number: 2 N/A N/A Photos: N/A N/A Right, Posterior Lower Leg N/A N/A Wound Location: Insect Bite N/A N/A Wounding Event: Venous Leg Ulcer N/A N/A Primary Etiology: Anemia, Sleep Apnea, Hypertension, N/A N/A Comorbid History: Peripheral Venous Disease 11/21/2021 N/A N/A Date Acquired: 4 N/A N/A [name] of Treatment: Open N/A N/A Wound Status: No N/A N/A Wound Recurrence: 2.3x2.8x0.6 N/A N/A Measurements L x W x D (cm) 5.058 N/A N/A A (cm) : rea 3.035 N/A N/A Volume (cm) : -1432.70% N/A N/A % Reduction in A rea: -2965.70% N/A N/A % Reduction in Volume: Full Thickness Without Exposed N/A N/A Classification: Support Structures Medium N/A N/A Exudate A mount: Serosanguineous N/A  N/A Exudate Type: red, brown N/A N/A Exudate Color: Distinct, outline attached N/A N/A Wound Margin: Small (1-33%) N/A N/A Granulation A mount: Red N/A N/A Granulation Quality: Large (67-100%) N/A N/A Necrotic A mount: Eschar, Adherent Slough N/A N/A Necrotic Tissue: Fat Layer (Subcutaneous Tissue): Yes N/A N/A Exposed Structures: Fascia: No Tendon: No Muscle: No Joint: No Bone: No None N/A N/A Epithelialization: Debridement - Excisional N/A N/A Debridement: Pre-procedure Verification/Time Out 15:55 N/A N/A Taken: Lidocaine 4% Topical Solution N/A N/A Pain Control: Subcutaneous, Slough N/A N/A Tissue Debrided: Skin/Subcutaneous Tissue N/A N/A Level: 6.44 N/A N/A Debridement A (sq cm): rea Curette N/A N/A Instrument: Minimum N/A N/A Bleeding: Pressure N/A N/A Hemostasis A chieved: 8 N/A N/A Procedural Pain: 5 N/A N/A Post Procedural Pain: Procedure was tolerated well N/A N/A Debridement Treatment Response: 2.3x2.8x0.6 N/A N/A Post Debridement Measurements L x W x D (cm) 3.035 N/A N/A Post Debridement Volume: (cm) Excoriation: Yes N/A N/A Periwound Skin Texture: No Abnormalities Noted N/A N/A Periwound Skin Moisture: Erythema: Yes N/A N/A Periwound Skin Color: Rubor: Yes Atrophie Blanche: No Ecchymosis: No No Abnormality N/A N/A Temperature: Compression Therapy N/A N/A Procedures Performed: Debridement CLARINDA, OBI ([id number]) [id number] Page 4 of 8 Treatment Notes Wound #2 (Lower Leg) Wound Laterality: Right, Posterior Cleanser Soap and Water Discharge Instruction: May shower and wash wound with dial antibacterial soap and water prior to dressing change. Wound Cleanser Discharge Instruction: Cleanse the wound with wound cleanser prior to applying a clean dressing using gauze sponges, not tissue or cotton balls. Peri-Wound Care Sween Lotion (Moisturizing lotion) Discharge Instruction: Apply moisturizing lotion  as directed Topical Triamcinolone Discharge Instruction: Apply Triamcinolone as directed Primary Dressing Anasept Antimicrobial Wound Gel, 1.5 (oz) tube Discharge Instruction: moisten gauze with anasept and pack into wound Secondary Dressing Woven Gauze Sponge, Non-Sterile 4x4 in Discharge Instruction: Apply over primary dressing as directed. Secured With Compression Wrap ThreePress (3 layer compression wrap) Discharge Instruction: Apply three layer compression as directed. Unnaboot w/Calamine, 4x10 (in/yd) Discharge Instruction: Apply Unnaboot at top of leg Compression Stockings Add-Ons Electronic Signature(s) Signed: 02/22/2022 5:00:43 PM By: [name] MD FACS Entered By: [name] on 02/22/2022 17:00:43 -------------------------------------------------------------------------------- Multi-Disciplinary Care Plan Details Patient Name: Date of Service: [place], [name] 02/22/2022 3:30 PM Medical Record Number: [id number] Patient Account Number: [id number] Date of Birth/Sex: Treating RN: 1960/10/10 (61 y.o. [name] Primary Care Cinde Ebert: [name], [facility] RBA RA Other Clinician: Referring Keyatta Tolles: Treating Vivyan Biggers/Extender: [name] in Treatment: 4 Multidisciplinary Care Plan reviewed with physician Active Inactive Necrotic Tissue Nursing Diagnoses: Impaired  tissue integrity related to necrotic/devitalized tissue Knowledge deficit related to management of necrotic/devitalized tissue Goals: Necrotic/devitalized tissue will be minimized in the wound bed [name] CorkWATKINS, [name] ([id number]) [id number] Page 5 of 8 Date Initiated: 01/23/2022 Target Resolution Date: 06/06/2022 Goal Status: Active Patient/caregiver will verbalize understanding of reason and process for debridement of necrotic tissue Date Initiated: 01/23/2022 Target Resolution Date: 06/06/2022 Goal Status: Active Interventions: Assess  patient pain level pre-, during and post procedure and prior to discharge Provide education on necrotic tissue and debridement process Treatment Activities: Apply topical anesthetic as ordered : 01/23/2022 Notes: Wound/Skin Impairment Nursing Diagnoses: Impaired tissue integrity Knowledge deficit related to ulceration/compromised skin integrity Goals: Patient/caregiver will verbalize understanding of skin care regimen Date Initiated: 01/23/2022 Target Resolution Date: 06/06/2022 Goal Status: Active Interventions: Assess patient/caregiver ability to obtain necessary supplies Assess ulceration(s) every visit Treatment Activities: Skin care regimen initiated : 01/23/2022 Topical wound management initiated : 01/23/2022 Notes: Electronic Signature(s) Signed: 02/22/2022 5:40:17 PM By: [name] DeedBoehlein, [name] RN, BSN Entered By: [name] DeedBoehlein, [name] on 02/22/2022 15:58:31 -------------------------------------------------------------------------------- Pain Assessment Details Patient Name: Date of Service: [name] [place] [name], [name] [name] 02/22/2022 3:30 PM Medical Record Number: [id number] Patient Account Number: [id number] Date of Birth/Sex: Treating RN: 04/19/1960 (61 y.o. [name] PhenixF) Herrington, Taylor Primary Care Raliegh Scobie: [name] PilgrimMICHA EL, [facility] R[facility] RA Other Clinician: Referring Melquan Ernsberger: Treating Nigel Ericsson/Extender: [name] Flockannon, Jennifer Koberlein, Junell [name] in Treatment: 4 Active Problems Location of Pain Severity and Description of Pain Patient Has Paino No Site Locations Rate the pain. [name] CorkWATKINS, [name] ([id number]) [id number] Page 6 of 8 Rate the pain. Current Pain Level: 0 Pain Management and Medication Current Pain Management: Electronic Signature(s) Signed: 02/22/2022 4:44:53 PM By: [name] BruinHerrington, Taylor Entered By: [name] BruinHerrington, Taylor on 02/22/2022 15:36:28 -------------------------------------------------------------------------------- Patient/Caregiver Education Details Patient Name:  Date of Service: [name] [place] [name], [name] PlumSURA [name] 11/17/2023andnbsp3:30 PM Medical Record Number: [id number] Patient Account Number: [id number] Date of Birth/Gender: Treating RN: 06/09/1960 (61 y.o. [name] StandardF) Boehlein, [name] Primary Care Physician: [name] PilgrimMICHA EL, [facility] Other Clinician: Referring Physician: Treating Physician/Extender: [name] Fasterannon, Jennifer Koberlein, Junell [name] in Treatment: 4 Education Assessment Education Provided To: Patient Education Topics Provided Venous: Methods: Explain/Verbal Responses: Reinforcements needed, State content correctly Wound Debridement: Methods: Explain/Verbal Responses: Reinforcements needed, State content correctly Wound/Skin Impairment: Methods: Explain/Verbal Responses: Reinforcements needed, State content correctly Electronic Signature(s) Signed: 02/22/2022 5:40:17 PM By: [name] DeedBoehlein, [name] RN, BSN Entered By: [name] DeedBoehlein, [name] on 02/22/2022 15:59:54 [name] CorkWATKINS, [name] ([id number]) [id number] Page 7 of 8 -------------------------------------------------------------------------------- Wound Assessment Details Patient Name: Date of Service: [name], [name] [name] 02/22/2022 3:30 PM Medical Record Number: [id number] Patient Account Number: [id number] Date of Birth/Sex: Treating RN: 08/22/1960 (61 y.o. [name] PhenixF) Herrington, Taylor Primary Care Kellie Chisolm: [name] PilgrimMICHA EL, [facility] Other Clinician: Referring Cyncere Ruhe: Treating Iyonna Rish/Extender: [name] Flockannon, Jennifer Koberlein, Junell [name] in Treatment: 4 Wound Status Wound Number: 2 Primary Venous Leg Ulcer Etiology: Wound Location: Right, Posterior Lower Leg Wound Status: Open Wounding Event: Insect Bite Comorbid Anemia, Sleep Apnea, Hypertension, Peripheral Venous Date Acquired: 11/21/2021 History: Disease [name] Of Treatment: 4 Clustered Wound: No Photos Wound Measurements Length: (cm) 2 Width: (cm) 2 Depth: (cm) 0 Area: (cm) Volume: (cm) .3 % Reduction in Area: -1432.7% .8 % Reduction in  Volume: -2965.7% .6 Epithelialization: None 5.058 Tunneling: No 3.035 Undermining: No Wound Description Classification: Full Thickness Without Exposed Suppor Wound Margin: Distinct, outline attached Exudate Amount: Medium Exudate Type: Serosanguineous Exudate Color: red, brown t Structures Foul Odor After Cleansing: No Slough/Fibrino Yes Wound Bed Granulation Amount: Small (1-33%) Exposed Structure Granulation Quality: Red Fascia Exposed: No  Necrotic Amount: Large (67-100%) Fat Layer (Subcutaneous Tissue) Exposed: Yes Necrotic Quality: Eschar, Adherent Slough Tendon Exposed: No Muscle Exposed: No Joint Exposed: No Bone Exposed: No Periwound Skin Texture Texture Color No Abnormalities Noted: No No Abnormalities Noted: No Excoriation: Yes Atrophie Blanche: No Ecchymosis: No Moisture Erythema: Yes No Abnormalities Noted: Yes Rubor: Yes Temperature / Pain Temperature: No Abnormality Treatment Notes Wound #2 (Lower Leg) Wound Laterality: Right, Posterior ATHALIE, NEWHARD ([id number]) [id number] Page 8 of 8 Cleanser Soap and Water Discharge Instruction: May shower and wash wound with dial antibacterial soap and water prior to dressing change. Wound Cleanser Discharge Instruction: Cleanse the wound with wound cleanser prior to applying a clean dressing using gauze sponges, not tissue or cotton balls. Peri-Wound Care Sween Lotion (Moisturizing lotion) Discharge Instruction: Apply moisturizing lotion as directed Topical Triamcinolone Discharge Instruction: Apply Triamcinolone as directed Primary Dressing Anasept Antimicrobial Wound Gel, 1.5 (oz) tube Discharge Instruction: moisten gauze with anasept and pack into wound Secondary Dressing Woven Gauze Sponge, Non-Sterile 4x4 in Discharge Instruction: Apply over primary dressing as directed. Secured With Compression Wrap ThreePress (3 layer compression wrap) Discharge Instruction: Apply three  layer compression as directed. Unnaboot w/Calamine, 4x10 (in/yd) Discharge Instruction: Apply Unnaboot at top of leg Compression Stockings Add-Ons Electronic Signature(s) Signed: 02/22/2022 4:44:53 PM By: [name] Entered By: [name] on 02/22/2022 15:46:28 -------------------------------------------------------------------------------- Vitals Details Patient Name: Date of Service: [name], [name] 02/22/2022 3:30 PM Medical Record Number: [id number] Patient Account Number: [id number] Date of Birth/Sex: Treating RN: Dec 30, 1960 (61 y.o. [name] Primary Care Avigdor Dollar: [name], [facility] RBA RA Other Clinician: Referring Faisal Stradling: Treating Perrion Diesel/Extender: [name] in Treatment: 4 Vital Signs Time Taken: 15:36 Temperature (F): 98.4 Pulse (bpm): 102 Respiratory Rate (breaths/min): 20 Blood Pressure (mmHg): 160/90 Reference Range: 80 - 120 mg / dl Electronic Signature(s) Signed: 02/22/2022 4:44:53 PM By: [name] Entered By: [name] on 02/22/2022 15:37:01

## 2022-02-26 ENCOUNTER — Encounter (HOSPITAL_BASED_OUTPATIENT_CLINIC_OR_DEPARTMENT_OTHER): Payer: BC Managed Care – PPO | Admitting: General Surgery

## 2022-02-26 ENCOUNTER — Telehealth: Payer: Self-pay | Admitting: Family Medicine

## 2022-02-26 DIAGNOSIS — L97212 Non-pressure chronic ulcer of right calf with fat layer exposed: Secondary | ICD-10-CM | POA: Diagnosis not present

## 2022-02-26 NOTE — Progress Notes (Signed)
[name], [name] ([id number]) [id number] Page 1 of 10 Visit Report for 02/26/2022 Chief Complaint Document Details Patient Name: Date of Service: [place], [name] 02/26/2022 7:45 A M Medical Record Number: [id number] Patient Account Number: [id number] Date of Birth/Sex: Treating RN: 08-10-1960 (61 y.o. F) Primary Care Provider: Cecille Po, [facility] RBA RA Other Clinician: Referring Provider: Treating Provider/Extender: Bettey Mare EL, BA RBA RA Weeks in Treatment: 4 Information Obtained from: Patient Chief Complaint RLE ulcer Electronic Signature(s) Signed: 02/26/2022 8:31:41 AM By: Fredirick Maudlin MD FACS Entered By: Fredirick Maudlin on 02/26/2022 08:31:40 -------------------------------------------------------------------------------- Debridement Details Patient Name: Date of Service: [name], [name] 02/26/2022 7:45 A M Medical Record Number: [id number] Patient Account Number: [id number] Date of Birth/Sex: Treating RN: 12-22-1960 (61 y.o. [name] Primary Care Provider: Cecille Po, BA RBA RA Other Clinician: Referring Provider: Treating Provider/Extender: Bettey Mare EL, BA RBA RA Weeks in Treatment: 4 Debridement Performed for Assessment: Wound #2 Right,Posterior Lower Leg Performed By: Physician Fredirick Maudlin, MD Debridement Type: Debridement Severity of Tissue Pre Debridement: Fat layer exposed Level of Consciousness (Pre-procedure): Awake and Alert Pre-procedure Verification/Time Out Yes - 08:18 Taken: Start Time: 08:18 Pain Control: Lidocaine 5% topical ointment T Area Debrided (L x W): otal 2.3 (cm) x 2.8 (cm) = 6.44 (cm) Tissue and other material debrided: Slough, Subcutaneous, Slough Level: Skin/Subcutaneous Tissue Debridement Description: Excisional Instrument: Curette Bleeding: Minimum Hemostasis Achieved: Pressure End Time: 08:20 Procedural Pain: 0 Post Procedural Pain: 0 Response to  Treatment: Procedure was tolerated well Level of Consciousness (Post- Awake and Alert procedure): Post Debridement Measurements of Total Wound Length: (cm) 2.3 Width: (cm) 2.8 Depth: (cm) 0.5 Volume: (cm) 2.529 Character of Wound/Ulcer Post Debridement: Improved Severity of Tissue Post Debridement: Fat layer exposed [name], [name] ([id number]) [id number] Page 2 of 10 Post Procedure Diagnosis Same as Pre-procedure Notes Scribed for Dr. Celine Ahr by J.Scotton Electronic Signature(s) Signed: 02/26/2022 8:42:26 AM By: Fredirick Maudlin MD FACS Signed: 02/26/2022 9:02:38 AM By: Dellie Catholic RN Entered By: Dellie Catholic on 02/26/2022 08:21:23 -------------------------------------------------------------------------------- HPI Details Patient Name: Date of Service: [name], [name] 02/26/2022 7:45 A M Medical Record Number: [id number] Patient Account Number: [id number] Date of Birth/Sex: Treating RN: 1961-01-13 (61 y.o. F) Primary Care Provider: Cecille Po, [facility] RBA RA Other Clinician: Referring Provider: Treating Provider/Extender: Bettey Mare EL, BA RBA RA Weeks in Treatment: 4 History of Present Illness HPI Description: 02/16/2020 upon evaluation today patient actually appears to be doing poorly in regard to her left medial lower extremity ulcer. This is actually an area that she tells me she has had intermittent issues with over the years although has been closed for some time she typically uses compression right now she has juxta lite compression wraps. With that being said she tells me that this nonetheless open several weeks/months ago and has been given her trouble since. She does have a history of chronic venous insufficiency she is seeing specialist for this in the past she has had an ablation as well as sclerotherapy. With that being said she also has hypertension chronically which is managed by her primary care provider. In general  she seems to be worsening overall with regard to the wound and states that she finally realized that she needed to come in and have somebody look at this and not continue to try to manage this on her own. No fevers, chills, nausea, vomiting, or diarrhea. 02/23/2020 on evaluation today patient appears to be doing well with regard  to her wound. This is showing some signs of improvement which is great news still were not quite at the point where I would like to be as far as the overall appearance of the wound is concerned but I do believe this is better than last week. I do believe the Iodoflex is helping as well. 03/08/2020 upon evaluation today patient appears to be doing well with regard to her wound. She has been tolerating the dressing changes without complication. Fortunately I feel like she has made great progress with the Iodoflex but I feel like it may be the point rest to switch to something else possibly a collagen- based dressing at this time. 03/15/2020 upon evaluation today patient appears to be doing excellent in regard to her leg ulcer. She has been tolerating the dressing changes without complication. Fortunately there is no signs of active infection. Overall she is measuring a little bit smaller today which is great news. 03/22/2020 upon evaluation today patient appears to be doing well with regard to her wound. She has been tolerating the dressing changes without complication. Fortunately there is no signs of active infection at this time. 03/28/2020; patient I do not usually see however she has a wound on the left anterior lower leg secondary to chronic venous insufficiency we have been using silver collagen under compression. She arrives in clinic with a nonviable surface requiring debridement 04/12/2020 upon evaluation today patient appears to be doing well all things considered with regard to her leg ulcer. She is tolerating the dressing changes without complication there is minimal  dry skin around the edges of the wound that may be trapping and stopping some of the events of the new skin I am can work on that today. Otherwise the surface of the wound appears to be doing excellent. 04/19/2020 upon evaluation today patient appears to be doing well with regard to her leg ulcer. She has been tolerating dressing changes without complication. Fortunately there is no signs of active infection at this time. No fever chills noted overall very pleased with how things seem to be progressing. 04/26/2020 on evaluation today patient appears to be doing well with regard to her wound currently. Is showing signs of excellent improvement overall is filling in nicely and there does not appear to be any signs of infection. No fevers, chills, nausea, vomiting, or diarrhea. 05/03/2020 upon evaluation today patient appears to be doing well with regard to her wound on the leg. This overall showing signs of good improvement which is great she has some good epithelial growth and overall I think that things are moving in the correct direction. We likewise going to continue with the wound care measures as before since she seems to making such good improvement. 2/3; venous wound on the left medial leg. This is contracting. We are using Prisma and 3 layer compression. She has a stocking and waiting in the eventuality this heals. She is already using it on the right 05/17/2020 upon evaluation today patient appears to be doing well with regard to her leg ulcer. She has been tolerating the dressing changes without complication. Fortunately there is no signs of active infection which is great news and overall very pleased with where things stand today. No fevers, chills, nausea, vomiting, or diarrhea. 05/24/2020 upon evaluation today patient appears to be doing well with regard to her wound. Overall I feel like she is making excellent progress. There does not appear to be any signs of active infection which is great  news.  05/31/2020 upon evaluation today patient appears to be doing well with regard to her wound. There does not appear to be any signs of active infection which is great news overall I am extremely pleased with where things stand today. READMISSION 01/23/2022 DALICIA, SHIRCLIFF ([id number]) [id number] Page 3 of 10 She returns to clinic today with a new wound on her right posterior calf. She says that she was cleaning out an old shed near the middle of August this year and then noticed what seemed to be a bug bite on her right posterior calf. It was itchy, red, and raised. By the end of September, an ulcer had developed. She has been applying various topical creams such as hydrocortisone and others to the site. When it was not improving, she made an appointment in the wound care center. ABI in clinic today was 0.97. On her right posterior calf, there is a circular wound with necrotic fat and black eschar present. There is no purulent drainage or malodor. The periwound skin is in good condition with just a little induration that appears to be secondary to inflammation. 01/31/2022: The wound measures a little bit larger today, but overall is quite a bit cleaner. There is some undermining from 9:00 to 3:00. She is having some periwound itching and says that her wrap slid. 02/08/2022: Despite using an Unna boot first layer at the top of her wrap, it slid again and it looks like there is been some bruising at the wound site. The wound is about the same size in terms of dimensions. There is a fair amount of slough and nonviable tissue still present. Edema control is better than last week. 02/15/2022: The wound dimensions are about the same. There is less nonviable tissue present. The periwound erythema has improved. 02/22/2022: The wound has deteriorated over the past week. It is larger and the periwound is more edematous, erythematous, and indurated. It looks as though there has  been more tissue breakdown with undermining present. She is having more pain. There is a foul odor coming from the wound. 02/26/2022: The wound looks quite a bit better today and the odor is gone. I still do not have her culture data back, but she seems to be responding well to the Augmentin. Electronic Signature(s) Signed: 02/26/2022 8:32:23 AM By: Fredirick Maudlin MD FACS Entered By: Fredirick Maudlin on 02/26/2022 08:32:22 -------------------------------------------------------------------------------- Physical Exam Details Patient Name: Date of Service: [name], [name] 02/26/2022 7:45 A M Medical Record Number: [id number] Patient Account Number: [id number] Date of Birth/Sex: Treating RN: 03-04-1961 (61 y.o. F) Primary Care Provider: Cecille Po, BA RBA RA Other Clinician: Referring Provider: Treating Provider/Extender: Bettey Mare EL, BA RBA RA Weeks in Treatment: 4 Constitutional She is hypertensive, but asymptomatic.. . . . No acute distress. Respiratory Normal work of breathing on room air. Notes 02/26/2022: The wound looks quite a bit better today and the odor is gone. Electronic Signature(s) Signed: 02/26/2022 8:33:01 AM By: Fredirick Maudlin MD FACS Entered By: Fredirick Maudlin on 02/26/2022 08:33:00 -------------------------------------------------------------------------------- Physician Orders Details Patient Name: Date of Service: [name], [name] 02/26/2022 7:45 A M Medical Record Number: [id number] Patient Account Number: [id number] Date of Birth/Sex: Treating RN: October 07, 1960 (61 y.o. 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 - Dr. Lady Gary 03/06/22 at Return A 3pm Anesthetic (In clinic) Topical Lidocaine 4% applied to wound bed - Used in Clinic Bathing/ Shower/ Hygiene May shower with protection but do not get wound dressing(s) wet. Edema Control - Lymphedema / SCD / Other Bilateral Lower Extremities Elevate legs to the level of the heart or above for 30 minutes daily and/or when sitting, a frequency of: - throughout the day Avoid standing for long periods of time. Exercise regularly Moisturize legs daily. Compression stocking or Garment 20-30 mm/Hg pressure to: - left leg daily. Apply first thing in the morning, remove at night. Wound Treatment Wound #2 - Lower Leg Wound Laterality: Right, Posterior Cleanser: Soap and Water 1 x Per Week/30 Days Discharge Instructions: May shower and wash wound with dial antibacterial soap and water prior to dressing change. Cleanser: Wound Cleanser 1 x Per Week/30 Days Discharge Instructions: Cleanse the wound with wound cleanser prior to applying a clean dressing using gauze sponges, not tissue or cotton balls. Peri-Wound Care: Sween Lotion (Moisturizing lotion) 1 x Per Week/30 Days Discharge Instructions: Apply moisturizing lotion as directed Topical: Triamcinolone 1 x Per Week/30 Days Discharge Instructions: Apply Triamcinolone as directed Prim Dressing: Anasept Antimicrobial Wound Gel, 1.5 (oz) tube 1 x Per Week/30 Days ary Discharge Instructions: moisten gauze with anasept and pack into wound Secondary Dressing: Woven Gauze Sponge, Non-Sterile 4x4 in 1 x Per Week/30 Days Discharge Instructions: Apply over primary dressing as directed. Compression Wrap: ThreePress (3 layer compression wrap) 1 x Per Week/30 Days Discharge Instructions: Apply three layer  compression as directed. Compression Wrap: Unnaboot w/Calamine, 4x10 (in/yd) 1 x Per Week/30 Days Discharge Instructions: Apply Unnaboot at top of leg Electronic Signature(s) Signed: 02/26/2022 8:42:26 AM By: [name] MD FACS Entered By: [name] on 02/26/2022 08:33:14 -------------------------------------------------------------------------------- Problem List Details Patient Name: Date of Service: [name], [name] 02/26/2022 7:45 A M Medical Record Number: [id number] Patient Account Number: [id number] Date of Birth/Sex: Treating RN: 01/06/1961 (61 y.o. F) Primary Care Provider: Gerilyn Pilgrim, [facility] RBA RA Other Clinician: Referring Provider: Treating Provider/Extender: Renelda Mom EL, BA RBA RA Weeks in Treatment: 4 Active Problems ICD-10 [name], [name] ([id number]) [id number] Page 5 of 10 Encounter Code Description Active Date MDM Diagnosis L97.212 Non-pressure chronic ulcer of right calf with fat layer exposed 01/23/2022 No Yes E66.01 Morbid (severe) obesity due to excess calories 01/23/2022 No Yes I10 Essential (primary) hypertension 01/23/2022 No Yes R60.0 Localized edema 01/23/2022 No Yes Inactive Problems Resolved Problems Electronic Signature(s) Signed: 02/26/2022 8:31:27 AM By: [name] MD FACS Entered By: [name] on 02/26/2022 08:31:27 -------------------------------------------------------------------------------- Progress Note Details Patient Name: Date of Service: [name], [name] 02/26/2022 7:45 A M Medical Record Number: [id number] Patient Account Number: [id number] Date of Birth/Sex: Treating RN: [birth year] (61 y.o. F) Primary Care Provider: Gerilyn Pilgrim, [facility] RBA RA Other Clinician: Referring Provider: Treating Provider/Extender: Renelda Mom EL, BA RBA RA Weeks in Treatment: 4 Subjective Chief Complaint Information obtained from Patient RLE ulcer History of Present  Illness (HPI) 02/16/2020 upon evaluation today patient actually appears to be doing poorly in regard to her left medial lower extremity ulcer. This is actually an area that she tells me she has had intermittent issues with over the years although has been closed for some time she  typically uses compression right now she has juxta lite compression wraps. With that being said she tells me that this nonetheless open several weeks/months ago and has been given her trouble since. She does have a history of chronic venous insufficiency she is seeing specialist for this in the past she has had an ablation as well as sclerotherapy. With that being said she also has hypertension chronically which is managed by her primary care provider. In general she seems to be worsening overall with regard to the wound and states that she finally realized that she needed to come in and have somebody look at this and not continue to try to manage this on her own. No fevers, chills, nausea, vomiting, or diarrhea. 02/23/2020 on evaluation today patient appears to be doing well with regard to her wound. This is showing some signs of improvement which is great news still were not quite at the point where I would like to be as far as the overall appearance of the wound is concerned but I do believe this is better than last week. I do believe the Iodoflex is helping as well. 03/08/2020 upon evaluation today patient appears to be doing well with regard to her wound. She has been tolerating the dressing changes without complication. Fortunately I feel like she has made great progress with the Iodoflex but I feel like it may be the point rest to switch to something else possibly a collagen- based dressing at this time. 03/15/2020 upon evaluation today patient appears to be doing excellent in regard to her leg ulcer. She has been tolerating the dressing changes without complication. Fortunately there is no signs of active infection.  Overall she is measuring a little bit smaller today which is great news. 03/22/2020 upon evaluation today patient appears to be doing well with regard to her wound. She has been tolerating the dressing changes without complication. Fortunately there is no signs of active infection at this time. 03/28/2020; patient I do not usually see however she has a wound on the left anterior lower leg secondary to chronic venous insufficiency we have been using silver collagen under compression. She arrives in clinic with a nonviable surface requiring debridement 04/12/2020 upon evaluation today patient appears to be doing well all things considered with regard to her leg ulcer. She is tolerating the dressing changes without complication there is minimal dry skin around the edges of the wound that may be trapping and stopping some of the events of the new skin I am can work on that today. Otherwise the surface of the wound appears to be doing excellent. 04/19/2020 upon evaluation today patient appears to be doing well with regard to her leg ulcer. She has been tolerating dressing changes without complication. [name], [name] ([id number]) [id number] Page 6 of 10 Fortunately there is no signs of active infection at this time. No fever chills noted overall very pleased with how things seem to be progressing. 04/26/2020 on evaluation today patient appears to be doing well with regard to her wound currently. Is showing signs of excellent improvement overall is filling in nicely and there does not appear to be any signs of infection. No fevers, chills, nausea, vomiting, or diarrhea. 05/03/2020 upon evaluation today patient appears to be doing well with regard to her wound on the leg. This overall showing signs of good improvement which is great she has some good epithelial growth and overall I think that things are moving in the correct direction. We likewise going to continue  with the wound  care measures as before since she seems to making such good improvement. 2/3; venous wound on the left medial leg. This is contracting. We are using Prisma and 3 layer compression. She has a stocking and waiting in the eventuality this heals. She is already using it on the right 05/17/2020 upon evaluation today patient appears to be doing well with regard to her leg ulcer. She has been tolerating the dressing changes without complication. Fortunately there is no signs of active infection which is great news and overall very pleased with where things stand today. No fevers, chills, nausea, vomiting, or diarrhea. 05/24/2020 upon evaluation today patient appears to be doing well with regard to her wound. Overall I feel like she is making excellent progress. There does not appear to be any signs of active infection which is great news. 05/31/2020 upon evaluation today patient appears to be doing well with regard to her wound. There does not appear to be any signs of active infection which is great news overall I am extremely pleased with where things stand today. READMISSION 01/23/2022 She returns to clinic today with a new wound on her right posterior calf. She says that she was cleaning out an old shed near the middle of August this year and then noticed what seemed to be a bug bite on her right posterior calf. It was itchy, red, and raised. By the end of September, an ulcer had developed. She has been applying various topical creams such as hydrocortisone and others to the site. When it was not improving, she made an appointment in the wound care center. ABI in clinic today was 0.97. On her right posterior calf, there is a circular wound with necrotic fat and black eschar present. There is no purulent drainage or malodor. The periwound skin is in good condition with just a little induration that appears to be secondary to inflammation. 01/31/2022: The wound measures a little bit larger today, but  overall is quite a bit cleaner. There is some undermining from 9:00 to 3:00. She is having some periwound itching and says that her wrap slid. 02/08/2022: Despite using an Unna boot first layer at the top of her wrap, it slid again and it looks like there is been some bruising at the wound site. The wound is about the same size in terms of dimensions. There is a fair amount of slough and nonviable tissue still present. Edema control is better than last week. 02/15/2022: The wound dimensions are about the same. There is less nonviable tissue present. The periwound erythema has improved. 02/22/2022: The wound has deteriorated over the past week. It is larger and the periwound is more edematous, erythematous, and indurated. It looks as though there has been more tissue breakdown with undermining present. She is having more pain. There is a foul odor coming from the wound. 02/26/2022: The wound looks quite a bit better today and the odor is gone. I still do not have her culture data back, but she seems to be responding well to the Augmentin. Patient History Information obtained from Patient. Family History Cancer - Mother, Diabetes - Father, Hypertension - Mother, Kidney Disease - Mother, Lung Disease - Father, Thyroid Problems - Paternal Grandparents, No family history of Heart Disease, Seizures, Stroke, Tuberculosis. Social History Never smoker, Marital Status - Married, Alcohol Use - Never, Drug Use - No History, Caffeine Use - Daily - coffee. Medical History Eyes Denies history of Cataracts, Glaucoma, Optic Neuritis Ear/Nose/Mouth/Throat Denies history of  Chronic sinus problems/congestion, Middle ear problems Hematologic/Lymphatic Patient has history of Anemia - iron Denies history of Hemophilia, Human Immunodeficiency Virus, Lymphedema, Sickle Cell Disease Respiratory Patient has history of Sleep Apnea - CPAP Denies history of Aspiration, Asthma, Chronic Obstructive Pulmonary Disease  (COPD), Pneumothorax, Tuberculosis Cardiovascular Patient has history of Hypertension, Peripheral Venous Disease Denies history of Angina, Arrhythmia, Congestive Heart Failure, Coronary Artery Disease, Deep Vein Thrombosis, Hypotension, Myocardial Infarction, Peripheral Arterial Disease, Phlebitis, Vasculitis Gastrointestinal Denies history of Cirrhosis , Colitis, Crohnoos, Hepatitis A, Hepatitis B, Hepatitis C Endocrine Denies history of Type I Diabetes, Type II Diabetes Genitourinary Denies history of End Stage Renal Disease Immunological Denies history of Lupus Erythematosus, Raynaudoos, Scleroderma Integumentary (Skin) Denies history of History of Burn Musculoskeletal Denies history of Gout, Rheumatoid Arthritis, Osteoarthritis, Osteomyelitis Neurologic Denies history of Dementia, Neuropathy, Quadriplegia, Paraplegia, Seizure Disorder Oncologic Denies history of Received Chemotherapy, Received Radiation Psychiatric Denies history of Anorexia/bulimia, Confinement Anxiety Hospitalization/Surgery History - cholecystectomy 1980s. - nephrolithasis 1980s. - plate and rod in right elbow surgery 2010. Medical A Surgical History Notes nd Genitourinary [name], [name] ([id number]) [id number] Page 7 of 10 years ago kidney stones Oncologic skin Ca removed from back years ago Objective Constitutional She is hypertensive, but asymptomatic.Marland Kitchen No acute distress. Vitals Time Taken: 8:05 AM, Temperature: 98.3 F, Pulse: 90 bpm, Respiratory Rate: 18 breaths/min, Blood Pressure: 152/99 mmHg. Respiratory Normal work of breathing on room air. General Notes: 02/26/2022: The wound looks quite a bit better today and the odor is gone. Integumentary (Hair, Skin) Wound #2 status is Open. Original cause of wound was Insect Bite. The date acquired was: 11/21/2021. The wound has been in treatment 4 weeks. The wound is located on the Right,Posterior Lower Leg. The wound  measures 2.3cm length x 2.8cm width x 0.5cm depth; 5.058cm^2 area and 2.529cm^3 volume. There is no tunneling or undermining noted. There is a medium amount of serosanguineous drainage noted. The wound margin is distinct with the outline attached to the wound base. There is medium (34-66%) red granulation within the wound bed. There is a medium (34-66%) amount of necrotic tissue within the wound bed including Eschar and Adherent Slough. The periwound skin appearance had no abnormalities noted for moisture. The periwound skin appearance exhibited: Excoriation, Rubor, Erythema. The periwound skin appearance did not exhibit: Atrophie Blanche, Ecchymosis. The surrounding wound skin color is noted with erythema. Periwound temperature was noted as No Abnormality. Assessment Active Problems ICD-10 Non-pressure chronic ulcer of right calf with fat layer exposed Morbid (severe) obesity due to excess calories Essential (primary) hypertension Localized edema Procedures Wound #2 Pre-procedure diagnosis of Wound #2 is a Venous Leg Ulcer located on the Right,Posterior Lower Leg .Severity of Tissue Pre Debridement is: Fat layer exposed. There was a Excisional Skin/Subcutaneous Tissue Debridement with a total area of 6.44 sq cm performed by Fredirick Maudlin, MD. With the following instrument(s): Curette Material removed includes Subcutaneous Tissue and Slough and after achieving pain control using Lidocaine 5% topical ointment. No specimens were taken. A time out was conducted at 08:18, prior to the start of the procedure. A Minimum amount of bleeding was controlled with Pressure. The procedure was tolerated well with a pain level of 0 throughout and a pain level of 0 following the procedure. Post Debridement Measurements: 2.3cm length x 2.8cm width x 0.5cm depth; 2.529cm^3 volume. Character of Wound/Ulcer Post Debridement is improved. Severity of Tissue Post Debridement is: Fat layer exposed. Post procedure  Diagnosis Wound #2: Same as Pre-Procedure General Notes: Scribed for Dr.  Daichi Moris by [employer]. Pre-procedure diagnosis of Wound #2 is a Venous Leg Ulcer located on the Right,Posterior Lower Leg . There was a Three Layer Compression Therapy Procedure by Dellie Catholic, RN. Post procedure Diagnosis Wound #2: Same as Pre-Procedure Plan Follow-up Appointments: Return Appointment in 1 week. - Dr. Celine Ahr 03/06/22 at 3pm Anesthetic: (In clinic) Topical Lidocaine 4% applied to wound bed - Used in Clinic Bathing/ Shower/ Hygiene: May shower with protection but do not get wound dressing(s) wet. Edema Control - Lymphedema / SCD / Other: Elevate legs to the level of the heart or above for 30 minutes daily and/or when sitting, a frequency of: - throughout the day [name], [name] ([id number]) [id number] Page 8 of 10 Avoid standing for long periods of time. Exercise regularly Moisturize legs daily. Compression stocking or Garment 20-30 mm/Hg pressure to: - left leg daily. Apply first thing in the morning, remove at night. WOUND #2: - Lower Leg Wound Laterality: Right, Posterior Cleanser: Soap and Water 1 x Per Week/30 Days Discharge Instructions: May shower and wash wound with dial antibacterial soap and water prior to dressing change. Cleanser: Wound Cleanser 1 x Per Week/30 Days Discharge Instructions: Cleanse the wound with wound cleanser prior to applying a clean dressing using gauze sponges, not tissue or cotton balls. Peri-Wound Care: Sween Lotion (Moisturizing lotion) 1 x Per Week/30 Days Discharge Instructions: Apply moisturizing lotion as directed Topical: Triamcinolone 1 x Per Week/30 Days Discharge Instructions: Apply Triamcinolone as directed Prim Dressing: Anasept Antimicrobial Wound Gel, 1.5 (oz) tube 1 x Per Week/30 Days ary Discharge Instructions: moisten gauze with anasept and pack into wound Secondary Dressing: Woven Gauze Sponge, Non-Sterile 4x4 in 1 x  Per Week/30 Days Discharge Instructions: Apply over primary dressing as directed. Com pression Wrap: ThreePress (3 layer compression wrap) 1 x Per Week/30 Days Discharge Instructions: Apply three layer compression as directed. Com pression Wrap: Unnaboot w/Calamine, 4x10 (in/yd) 1 x Per Week/30 Days Discharge Instructions: Apply Unnaboot at top of leg 02/26/2022: The wound looks quite a bit better today and the odor is gone. I used a curette to debride nonviable subcutaneous tissue and slough from her wound. She will continue her Augmentin and once culture data return, I will tailor her antibiotic therapy as indicated. We will continue to pack the wound with Anasept gel and use 3 layer compression. Follow-up in 1 week. Electronic Signature(s) Signed: 02/26/2022 8:34:00 AM By: Fredirick Maudlin MD FACS Entered By: Fredirick Maudlin on 02/26/2022 08:34:00 -------------------------------------------------------------------------------- HxROS Details Patient Name: Date of Service: [name], [name] 02/26/2022 7:45 A M Medical Record Number: [id number] Patient Account Number: [id number] Date of Birth/Sex: Treating RN: [birth year] (61 y.o. F) Primary Care Provider: Cecille Po, BA RBA RA Other Clinician: Referring Provider: Treating Provider/Extender: Bettey Mare EL, BA RBA RA Weeks in Treatment: 4 Information Obtained From Patient Eyes Medical History: Negative for: Cataracts; Glaucoma; Optic Neuritis Ear/Nose/Mouth/Throat Medical History: Negative for: Chronic sinus problems/congestion; Middle ear problems Hematologic/Lymphatic Medical History: Positive for: Anemia - iron Negative for: Hemophilia; Human Immunodeficiency Virus; Lymphedema; Sickle Cell Disease Respiratory Medical History: Positive for: Sleep Apnea - CPAP Negative for: Aspiration; Asthma; Chronic Obstructive Pulmonary Disease (COPD); Pneumothorax; Tuberculosis Cardiovascular Medical History: [name], [name]  ([id number]) [id number] Page 9 of 10 Positive for: Hypertension; Peripheral Venous Disease Negative for: Angina; Arrhythmia; Congestive Heart Failure; Coronary Artery Disease; Deep Vein Thrombosis; Hypotension; Myocardial Infarction; Peripheral Arterial Disease; Phlebitis; Vasculitis Gastrointestinal Medical History: Negative for: Cirrhosis ; Colitis; Crohns; Hepatitis A; Hepatitis B; Hepatitis C Endocrine  Medical History: Negative for: Type I Diabetes; Type II Diabetes Genitourinary Medical History: Negative for: End Stage Renal Disease Past Medical History Notes: years ago kidney stones Immunological Medical History: Negative for: Lupus Erythematosus; Raynauds; Scleroderma Integumentary (Skin) Medical History: Negative for: History of Burn Musculoskeletal Medical History: Negative for: Gout; Rheumatoid Arthritis; Osteoarthritis; Osteomyelitis Neurologic Medical History: Negative for: Dementia; Neuropathy; Quadriplegia; Paraplegia; Seizure Disorder Oncologic Medical History: Negative for: Received Chemotherapy; Received Radiation Past Medical History Notes: skin Ca removed from back years ago Psychiatric Medical History: Negative for: Anorexia/bulimia; Confinement Anxiety Immunizations Pneumococcal Vaccine: Received Pneumococcal Vaccination: No Implantable Devices None Hospitalization / Surgery History Type of Hospitalization/Surgery cholecystectomy 1980s nephrolithasis 1980s plate and rod in right elbow surgery 2010 Family and Social History Cancer: Yes - Mother; Diabetes: Yes - Father; Heart Disease: No; Hypertension: Yes - Mother; Kidney Disease: Yes - Mother; Lung Disease: Yes - Father; Seizures: No; Stroke: No; Thyroid Problems: Yes - Paternal Grandparents; Tuberculosis: No; Never smoker; Marital Status - Married; Alcohol Use: Never; Drug Use: No History; Caffeine Use: Daily - coffee; Financial Concerns: No; Food, Clothing or Shelter  Needs: No; Support System Lacking: No; Transportation Concerns: No Electronic Signature(s) Signed: 02/26/2022 8:42:26 AM By: Fredirick Maudlin MD FACS Entered By: Fredirick Maudlin on 02/26/2022 [id number] [name] ([id number]) [id number] Page 10 of 10 -------------------------------------------------------------------------------- SuperBill Details Patient Name: Date of Service: [name] 02/26/2022 Medical Record Number: [id number] Patient Account Number: [id number] Date of Birth/Sex: Treating RN: 1960/05/06 (61 y.o. F) Primary Care Provider: Cecille Po, BA RBA RA Other Clinician: Referring Provider: Treating Provider/Extender: Bettey Mare EL, BA RBA RA Weeks in Treatment: 4 Diagnosis Coding ICD-10 Codes Code Description [phone] Non-pressure chronic ulcer of right calf with fat layer exposed E66.01 Morbid (severe) obesity due to excess calories I10 Essential (primary) hypertension R60.0 Localized edema Facility Procedures : CPT4 Code: [id number] Description: [id number] - DEB SUBQ TISSUE 20 SQ CM/< ICD-10 Diagnosis Description L97.212 Non-pressure chronic ulcer of right calf with fat layer exposed Modifier: Quantity: 1 Physician Procedures : CPT4 Code Description Modifier [id number] - WC PHYS LEVEL 4 - EST PT 25 ICD-10 Diagnosis Description L97.212 Non-pressure chronic ulcer of right calf with fat layer exposed I10 Essential (primary) hypertension R60.0 Localized edema E66.01 Morbid  (severe) obesity due to excess calories Quantity: 1 : [id number] - WC PHYS SUBQ TISS 20 SQ CM ICD-10 Diagnosis Description L97.212 Non-pressure chronic ulcer of right calf with fat layer exposed Quantity: 1 Electronic Signature(s) Signed: 02/26/2022 8:34:17 AM By: Fredirick Maudlin MD FACS Entered By: Fredirick Maudlin on 02/26/2022 08:34:17

## 2022-02-26 NOTE — Telephone Encounter (Signed)
Left a detailed message at the patient's cell number stating the patches were initially called in, denied by insurance and the ointment was sent in.  I left a message stating she should be able to contact the pharmacy and let them know she would prefer to pay cash for the Rx and to call back with any questions if needed.

## 2022-02-26 NOTE — Progress Notes (Signed)
[name], [name] ([id number]) [id number] Page 1 of 8 Visit Report for 02/26/2022 Arrival Information Details Patient Name: [name] 02/26/2022 7:45 A M Medical Record Number: [id number] Patient Account Number: [id number] Date of Birth/Sex: Treating RN: 09/03/1960 (61 y.o. Katrinka BlazingF) Scotton, Joanne Primary Care Jelissa Espiritu: Gerilyn PilgrimMICHA EL, BA RBA RA Other Clinician: Referring Laine Giovanetti: Treating Melika Reder/Extender: Renelda Momannon, Jennifer MICHA EL, BA RBA RA Weeks in Treatment: 4 Visit Information History Since Last Visit Added or deleted any medications: No Patient Arrived: Ambulatory Any new allergies or adverse reactions: No Arrival Time: 07:48 Had a fall or experienced change in No Accompanied By: self activities of daily living that may affect Transfer Assistance: None risk of falls: Patient Identification Verified: Yes Signs or symptoms of abuse/neglect since last visito No Patient Requires Transmission-Based Precautions: No Hospitalized since last visit: No Patient Has Alerts: No Implantable device outside of the clinic excluding No cellular tissue based products placed in the center since last visit: Has Dressing in Place as Prescribed: Yes Has Compression in Place as Prescribed: Yes Pain Present Now: No Electronic Signature(s) Signed: 02/26/2022 9:02:38 AM By: Karie SchwalbeScotton, Joanne RN Entered By: Karie SchwalbeScotton, Joanne on 02/26/2022 07:52:16 -------------------------------------------------------------------------------- Compression Therapy Details Patient Name: Date of [place] [name] 02/26/2022 7:45 A M Medical Record Number: [id number] Patient Account Number: [id number] Date of Birth/Sex: Treating RN: 06/20/1960 (61 y.o. Katrinka BlazingF) Scotton, Joanne Primary Care Keslee Harrington: Gerilyn PilgrimMICHA EL, [facility] R[facility] RA Other Clinician: Referring Alexandrina Fiorini: Treating Deavon Podgorski/Extender: Renelda Momannon, Jennifer MICHA EL, BA RBA RA Weeks in Treatment: 4 Compression Therapy  Performed for Wound Assessment: Wound #2 Right,Posterior Lower Leg Performed By: Clinician Karie SchwalbeScotton, Joanne, RN Compression Type: Three Layer Post Procedure Diagnosis Same as Pre-procedure Electronic Signature(s) Signed: 02/26/2022 9:02:38 AM By: Karie SchwalbeScotton, Joanne RN Entered By: Karie SchwalbeScotton, Joanne on 02/26/2022 08:21:39 [name], [name] ([id number]) [id number] Page 2 of 8 -------------------------------------------------------------------------------- Encounter Discharge Information Details Patient Name: [name] 02/26/2022 7:45 A M Medical Record Number: [id number] Patient Account Number: [id number] Date of Birth/Sex: Treating RN: 03/08/1961 (61 y.o. Katrinka BlazingF) Scotton, Joanne Primary Care Aissata Wilmore: Gerilyn PilgrimMICHA EL, [facility] Other Clinician: Referring Ambyr Qadri: Treating Oneita Allmon/Extender: Renelda Momannon, Jennifer MICHA EL, BA RBA RA Weeks in Treatment: 4 Encounter Discharge Information Items Post Procedure Vitals Discharge Condition: Stable Temperature (F): 98.3 Ambulatory Status: Ambulatory Pulse (bpm): 90 Discharge Destination: Home Respiratory Rate (breaths/min): 18 Transportation: Private Auto Blood Pressure (mmHg): 152/88 Accompanied By: self Schedule Follow-up Appointment: Yes Clinical Summary of Care: Patient Declined Electronic Signature(s) Signed: 02/26/2022 9:02:38 AM By: Karie SchwalbeScotton, Joanne RN Entered By: Karie SchwalbeScotton, Joanne on 02/26/2022 08:59:51 -------------------------------------------------------------------------------- Lower Extremity Assessment Details Patient Name: [place] of Service: [place] [name] 02/26/2022 7:45 A M Medical Record Number: [id number] Patient Account Number: [id number] Date of Birth/Sex: Treating RN: 12/23/1960 (61 y.o. Katrinka BlazingF) Scotton, Joanne Primary Care Loria Lacina: Gerilyn PilgrimMICHA EL, [facility] R[facility] RA Other Clinician: Referring Fayette Gasner: Treating Brenden Rudman/Extender: Renelda Momannon, Jennifer MICHA EL, BA RBA RA Weeks in Treatment: 4 Edema  Assessment Assessed: [Left: No] [Right: No] [Left: Edema] [Right: :] Calf Left: Right: Point of Measurement: From Medial Instep 44.6 cm Ankle Left: Right: Point of Measurement: From Medial Instep 24 cm Vascular Assessment Pulses: Dorsalis Pedis Palpable: [Right:Yes] Electronic Signature(s) Signed: 02/26/2022 9:02:38 AM By: Karie SchwalbeScotton, Joanne RN Entered By: Karie SchwalbeScotton, Joanne on 02/26/2022 08:05:55 Multi Wound Chart Details -------------------------------------------------------------------------------- [name], [name] ([id number]) [id number] Page 3 of 8 Patient Name: [place] of Service[place] [name] 02/26/2022 7:45 A M Medical Record Number: [id number] Patient  Account Number: [id number] Date of Birth/Sex: Treating RN: 20-Jan-1961 (61 y.o. F) Primary Care Chanell Nadeau: Gerilyn Pilgrim, BA RBA RA Other Clinician: Referring Amarrah Meinhart: Treating Jahn Franchini/Extender: Renelda Mom EL, BA RBA RA Weeks in Treatment: 4 Vital Signs Height(in): Pulse(bpm): 90 Weight(lbs): Blood Pressure(mmHg): 152/99 Body Mass Index(BMI): Temperature(F): 98.3 Respiratory Rate(breaths/min): 18 Wound Assessments Wound Number: 2 N/A N/A Photos: N/A N/A Right, Posterior Lower Leg N/A N/A Wound Location: Insect Bite N/A N/A Wounding Event: Venous Leg Ulcer N/A N/A Primary Etiology: Anemia, Sleep Apnea, Hypertension, N/A N/A Comorbid History: Peripheral Venous Disease 11/21/2021 N/A N/A Date Acquired: 4 N/A N/A Weeks of Treatment: Open N/A N/A Wound Status: No N/A N/A Wound Recurrence: 2.3x2.8x0.5 N/A N/A Measurements L x W x D (cm) 5.058 N/A N/A A (cm) : rea 2.529 N/A N/A Volume (cm) : -1432.70% N/A N/A % Reduction in A rea: -2454.50% N/A N/A % Reduction in Volume: Full Thickness Without Exposed N/A N/A Classification: Support Structures Medium N/A N/A Exudate A mount: Serosanguineous N/A N/A Exudate Type: red, brown N/A N/A Exudate Color: Distinct,  outline attached N/A N/A Wound Margin: Medium (34-66%) N/A N/A Granulation A mount: Red N/A N/A Granulation Quality: Medium (34-66%) N/A N/A Necrotic A mount: Eschar, Adherent Slough N/A N/A Necrotic Tissue: Fascia: No N/A N/A Exposed Structures: Fat Layer (Subcutaneous Tissue): No Tendon: No Muscle: No Joint: No Bone: No None N/A N/A Epithelialization: Debridement - Excisional N/A N/A Debridement: Pre-procedure Verification/Time Out 08:18 N/A N/A Taken: Lidocaine 5% topical ointment N/A N/A Pain Control: Subcutaneous, Slough N/A N/A Tissue Debrided: Skin/Subcutaneous Tissue N/A N/A Level: 6.44 N/A N/A Debridement A (sq cm): rea Curette N/A N/A Instrument: Minimum N/A N/A Bleeding: Pressure N/A N/A Hemostasis A chieved: 0 N/A N/A Procedural Pain: 0 N/A N/A Post Procedural Pain: Procedure was tolerated well N/A N/A Debridement Treatment Response: 2.3x2.8x0.5 N/A N/A Post Debridement Measurements L x W x D (cm) 2.529 N/A N/A Post Debridement Volume: (cm) Excoriation: Yes N/A N/A Periwound Skin Texture: No Abnormalities Noted N/A N/A Periwound Skin Moisture: Erythema: Yes N/A N/A Periwound Skin Color: Rubor: Yes Atrophie Blanche: No Ecchymosis: No No Abnormality N/A N/A Temperature: Compression Therapy N/A N/A Procedures Performed: Debridement DARLIN, STENSETH ([id number]) [id number] Page 4 of 8 Treatment Notes Electronic Signature(s) Signed: 02/26/2022 8:31:34 AM By: [name] MD FACS Entered By: [name] on 02/26/2022 08:31:34 -------------------------------------------------------------------------------- Multi-Disciplinary Care Plan Details Patient Name: Date of Service: [place], [name] 02/26/2022 7:45 A M Medical Record Number: [id number] Patient Account Number: [id number] Date of Birth/Sex: Treating RN: July 04, 1960 (61 y.o. Katrinka Blazing Primary Care Sheryle Vice: Gerilyn Pilgrim, [facility] RBA RA Other  Clinician: Referring Hester Forget: Treating Shyheim Tanney/Extender: Renelda Mom EL, BA RBA RA Weeks in Treatment: 4 Multidisciplinary Care Plan reviewed with physician Active Inactive Necrotic Tissue Nursing Diagnoses: Impaired tissue integrity related to necrotic/devitalized tissue Knowledge deficit related to management of necrotic/devitalized tissue Goals: Necrotic/devitalized tissue will be minimized in the wound bed Date Initiated: 01/23/2022 Target Resolution Date: 06/06/2022 Goal Status: Active Patient/caregiver will verbalize understanding of reason and process for debridement of necrotic tissue Date Initiated: 01/23/2022 Target Resolution Date: 06/06/2022 Goal Status: Active Interventions: Assess patient pain level pre-, during and post procedure and prior to discharge Provide education on necrotic tissue and debridement process Treatment Activities: Apply topical anesthetic as ordered : 01/23/2022 Notes: Wound/Skin Impairment Nursing Diagnoses: Impaired tissue integrity Knowledge deficit related to ulceration/compromised skin integrity Goals: Patient/caregiver will verbalize understanding of skin care regimen Date Initiated: 01/23/2022 Target Resolution Date: 06/06/2022 Goal Status: Active  Interventions: Assess patient/caregiver ability to obtain necessary supplies Assess ulceration(s) every visit Treatment Activities: Skin care regimen initiated : 01/23/2022 Topical wound management initiated : 01/23/2022 Notes: Electronic Signature(s) Signed: 02/26/2022 9:02:38 AM By: Karie Schwalbe RN [place],Signed: 02/26/2022 9:02:38 AM By: Karie Schwalbe RN Romie Minus ([id number]) [id number] Page 5 of 8 Entered By: Karie Schwalbe on 02/26/2022 08:58:00 -------------------------------------------------------------------------------- Pain Assessment Details Patient Name: Date of Service: [place], [name] 02/26/2022 7:45 A M Medical Record  Number: [id number] Patient Account Number: [id number] Date of Birth/Sex: Treating RN: 1960/08/17 (61 y.o. Katrinka Blazing Primary Care [name] Cantero: Gerilyn Pilgrim, [facility] RBA RA Other Clinician: Referring Evon Lopezperez: Treating Temisha Murley/Extender: Renelda Mom EL, BA RBA RA Weeks in Treatment: 4 Active Problems Location of Pain Severity and Description of Pain Patient Has Paino No Site Locations Pain Management and Medication Current Pain Management: Electronic Signature(s) Signed: 02/26/2022 9:02:38 AM By: Karie Schwalbe RN Entered By: Karie Schwalbe on 02/26/2022 08:05:32 -------------------------------------------------------------------------------- Patient/Caregiver Education Details Patient Name: Date of Service: [name], [name] [name] 11/21/2023andnbsp7:45 A M Medical Record Number: [id number] Patient Account Number: [id number] Date of Birth/Gender: Treating RN: 22-Jun-1960 (61 y.o. Katrinka Blazing Primary Care Physician: Gerilyn Pilgrim, [facility] RBA RA Other Clinician: Referring Physician: Treating Physician/Extender: Latrelle Dodrill, BA RBA RA Weeks in Treatment: 4 Education Assessment Education Provided To: Patient Education Topics Provided CORALINE, TALWAR ([id number]) [id number] Page 6 of 8 Wound/Skin Impairment: Methods: Explain/Verbal Responses: Return demonstration correctly Electronic Signature(s) Signed: 02/26/2022 9:02:38 AM By: Karie Schwalbe RN Entered By: Karie Schwalbe on 02/26/2022 08:58:19 -------------------------------------------------------------------------------- Wound Assessment Details Patient Name: Date of Service: [name], [name] 02/26/2022 7:45 A M Medical Record Number: [id number] Patient Account Number: [id number] Date of Birth/Sex: Treating RN: 08-27-1960 (61 y.o. Katrinka Blazing Primary Care Bao Coreas: Gerilyn Pilgrim, BA RBA RA Other Clinician: Referring Broadus Costilla: Treating Micharl Helmes/Extender: Renelda Mom EL, BA RBA RA Weeks in Treatment: 4 Wound Status Wound Number: 2 Primary Venous Leg Ulcer Etiology: Wound Location: Right, Posterior Lower Leg Wound Status: Open Wounding Event: Insect Bite Comorbid Anemia, Sleep Apnea, Hypertension, Peripheral Venous Date Acquired: 11/21/2021 History: Disease Weeks Of Treatment: 4 Clustered Wound: No Photos Wound Measurements Length: (cm) 2.3 Width: (cm) 2.8 Depth: (cm) 0.5 Area: (cm) 5.058 Volume: (cm) 2.529 % Reduction in Area: -1432.7% % Reduction in Volume: -2454.5% Epithelialization: None Tunneling: No Undermining: No Wound Description Classification: Full Thickness Without Exposed Suppor Wound Margin: Distinct, outline attached Exudate Amount: Medium Exudate Type: Serosanguineous Exudate Color: red, brown t Structures Foul Odor After Cleansing: No Slough/Fibrino Yes Wound Bed Granulation Amount: Medium (34-66%) Exposed Structure Granulation Quality: Red Fascia Exposed: No Necrotic Amount: Medium (34-66%) Fat Layer (Subcutaneous Tissue) Exposed: No Necrotic Quality: Eschar, Adherent Slough Tendon Exposed: No Muscle Exposed: No Joint Exposed: No Bone Exposed: No [address] DONNAMARIE, SHANKLES ([id number]) [id number] Page 7 of 8 No Abnormalities Noted: No No Abnormalities Noted: No Excoriation: Yes Atrophie Blanche: No Ecchymosis: No Moisture Erythema: Yes No Abnormalities Noted: Yes Rubor: Yes Temperature / Pain Temperature: No Abnormality Treatment Notes Wound #2 (Lower Leg) Wound Laterality: Right, Posterior Cleanser Soap and Water Discharge Instruction: May shower and wash wound with dial antibacterial soap and water prior to dressing change. Wound Cleanser Discharge Instruction: Cleanse the wound with wound cleanser prior to applying a clean dressing using gauze sponges, not tissue or cotton balls. Peri-Wound Care Sween Lotion (Moisturizing  lotion) Discharge Instruction: Apply moisturizing lotion as directed Topical Triamcinolone Discharge Instruction: Apply Triamcinolone as directed  Primary Dressing Anasept Antimicrobial Wound Gel, 1.5 (oz) tube Discharge Instruction: moisten gauze with anasept and pack into wound Secondary Dressing Woven Gauze Sponge, Non-Sterile 4x4 in Discharge Instruction: Apply over primary dressing as directed. Secured With Compression Wrap ThreePress (3 layer compression wrap) Discharge Instruction: Apply three layer compression as directed. Unnaboot w/Calamine, 4x10 (in/yd) Discharge Instruction: Apply Unnaboot at top of leg Compression Stockings Add-Ons Electronic Signature(s) Signed: 02/26/2022 9:02:38 AM By: Karie Schwalbe RN Entered By: Karie Schwalbe on 02/26/2022 08:09:03 -------------------------------------------------------------------------------- Vitals Details Patient Name: Date of Service: [name], [name] 02/26/2022 7:45 A M Medical Record Number: [id number] Patient Account Number: [id number] Date of Birth/Sex: Treating RN: 1960/10/23 (61 y.o. Katrinka Blazing Primary Care Ambermarie Honeyman: Gerilyn Pilgrim, BA RBA RA Other Clinician: Referring Justa Hatchell: Treating Dayquan Buys/Extender: Renelda Mom EL, BA RBA RA Weeks in Treatment: 4 Vital Signs Time Taken: 08:05 Temperature (F): 98.3 Pulse (bpm): 90 Respiratory Rate (breaths/min): 18 Blood Pressure (mmHg): 152/99 Reference Range: 80 - 120 mg / dl DENISE, WASHBURN ([id number]) [id number] Page 8 of 8 Electronic Signature(s) Signed: 02/26/2022 9:02:38 AM By: Karie Schwalbe RN Entered By: Karie Schwalbe on 02/26/2022 08:11:47

## 2022-02-26 NOTE — Telephone Encounter (Signed)
Pt is calling and does not want lidocaine (XYLOCAINE) 5 % ointment  pt would like patches  [address] - [place], [place] - [address] Phone: [phone]  Fax: [phone]

## 2022-02-27 ENCOUNTER — Other Ambulatory Visit (INDEPENDENT_AMBULATORY_CARE_PROVIDER_SITE_OTHER): Payer: BC Managed Care – PPO

## 2022-02-27 DIAGNOSIS — I10 Essential (primary) hypertension: Secondary | ICD-10-CM

## 2022-02-27 DIAGNOSIS — E039 Hypothyroidism, unspecified: Secondary | ICD-10-CM

## 2022-02-27 LAB — COMPREHENSIVE METABOLIC PANEL
ALT: 21 U/L (ref 0–35)
AST: 31 U/L (ref 0–37)
Albumin: 4.1 g/dL (ref 3.5–5.2)
Alkaline Phosphatase: 71 U/L (ref 39–117)
BUN: 21 mg/dL (ref 6–23)
CO2: 31 mEq/L (ref 19–32)
Calcium: 9.7 mg/dL (ref 8.4–10.5)
Chloride: 101 mEq/L (ref 96–112)
Creatinine, Ser: 0.84 mg/dL (ref 0.40–1.20)
GFR: 74.1 mL/min (ref >60.00)
Glucose, Bld: 96 mg/dL (ref 70–99)
Potassium: 4.2 mEq/L (ref 3.5–5.1)
Sodium: 138 mEq/L (ref 135–145)
Total Bilirubin: 0.3 mg/dL (ref 0.2–1.2)
Total Protein: 7.8 g/dL (ref 6.0–8.3)

## 2022-02-27 LAB — LIPID PANEL
Cholesterol: 131 mg/dL (ref 0–200)
HDL: 36.5 mg/dL — ABNORMAL LOW (ref >39.00)
LDL Cholesterol: 73 mg/dL (ref 0–99)
NonHDL: 94.94
Total CHOL/HDL Ratio: 4
Triglycerides: 110 mg/dL (ref 0.0–149.0)
VLDL: 22 mg/dL (ref 0.0–40.0)

## 2022-02-27 LAB — TSH: TSH: 6.87 u[IU]/mL — ABNORMAL HIGH (ref 0.35–5.50)

## 2022-03-06 ENCOUNTER — Encounter (HOSPITAL_BASED_OUTPATIENT_CLINIC_OR_DEPARTMENT_OTHER): Payer: BC Managed Care – PPO | Admitting: General Surgery

## 2022-03-06 DIAGNOSIS — L97212 Non-pressure chronic ulcer of right calf with fat layer exposed: Secondary | ICD-10-CM | POA: Diagnosis not present

## 2022-03-06 NOTE — Progress Notes (Signed)
[name], [name] ([id number]) [id number] Page 1 of 8 Visit Report for 03/06/2022 Arrival Information Details Patient Name: Date of Service: [name], [name] 03/06/2022 3:00 PM Medical Record Number: [id number] Patient Account Number: [id number] Date of Birth/Sex: Treating RN: 1960-09-02 (61 y.o. [name] Primary Care Miangel Flom: Gerilyn Pilgrim, BA RBA RA Other Clinician: Referring Nishawn Rotan: Treating Andrienne Havener/Extender: Renelda Mom EL, BA RBA RA Weeks in Treatment: 6 Visit Information History Since Last Visit Added or deleted any medications: No Patient Arrived: Ambulatory Any new allergies or adverse reactions: No Arrival Time: 15:13 Had a fall or experienced change in No Accompanied By: self activities of daily living that may affect Transfer Assistance: None risk of falls: Patient Identification Verified: Yes Signs or symptoms of abuse/neglect since last visito No Secondary Verification Process Completed: Yes Hospitalized since last visit: No Patient Requires Transmission-Based Precautions: No Implantable device outside of the clinic excluding No Patient Has Alerts: No cellular tissue based products placed in the center since last visit: Has Dressing in Place as Prescribed: Yes Has Compression in Place as Prescribed: Yes Pain Present Now: No Electronic Signature(s) Signed: 03/06/2022 4:37:16 PM By: Samuella Bruin Entered By: Samuella Bruin on 03/06/2022 15:14:12 -------------------------------------------------------------------------------- Compression Therapy Details Patient Name: Date of Service: [name], [name] 03/06/2022 3:00 PM Medical Record Number: [id number] Patient Account Number: [id number] Date of Birth/Sex: Treating RN: 08-04-1960 (61 y.o. [name] Primary Care Milana Salay: Gerilyn Pilgrim, [facility] RBA RA Other Clinician: Referring Rokhaya Quinn: Treating Oyuki Hogan/Extender: Renelda Mom EL, BA RBA  RA Weeks in Treatment: 6 Compression Therapy Performed for Wound Assessment: Wound #2 Right,Posterior Lower Leg Performed By: Clinician Samuella Bruin, RN Compression Type: Three Layer Post Procedure Diagnosis Same as Pre-procedure Electronic Signature(s) Signed: 03/06/2022 4:37:16 PM By: Samuella Bruin Entered By: Samuella Bruin on 03/06/2022 16:07:30 [name] ([id number]) [id number] Page 2 of 8 -------------------------------------------------------------------------------- Encounter Discharge Information Details Patient Name: Date of Service: [name], [name] 03/06/2022 3:00 PM Medical Record Number: [id number] Patient Account Number: [id number] Date of Birth/Sex: Treating RN: [birth year] (61 y.o. [name] Primary Care Rylinn Linzy: Gerilyn Pilgrim, [facility] RBA RA Other Clinician: Referring Jayel Inks: Treating Zorianna Taliaferro/Extender: Renelda Mom EL, BA RBA RA Weeks in Treatment: 6 Encounter Discharge Information Items Post Procedure Vitals Discharge Condition: Stable Temperature (F): 98 Ambulatory Status: Ambulatory Pulse (bpm): 102 Discharge Destination: Home Respiratory Rate (breaths/min): 18 Transportation: Private Auto Blood Pressure (mmHg): 126/64 Accompanied By: self Schedule Follow-up Appointment: Yes Clinical Summary of Care: Patient Declined Electronic Signature(s) Signed: 03/06/2022 4:37:16 PM By: Samuella Bruin Entered By: Samuella Bruin on 03/06/2022 15:52:09 -------------------------------------------------------------------------------- Lower Extremity Assessment Details Patient Name: Date of Service: [name], [name] 03/06/2022 3:00 PM Medical Record Number: [id number] Patient Account Number: [id number] Date of Birth/Sex: Treating RN: 03/16/1961 (61 y.o. [name] Primary Care Elysse Polidore: Gerilyn Pilgrim, [facility] RBA RA Other Clinician: Referring Degan Hanser: Treating Ahnika Hannibal/Extender: Renelda Mom EL, BA RBA RA Weeks in Treatment: 6 Edema Assessment Assessed: [Left: No] [Right: No] [Left: Edema] [Right: :] Calf Left: Right: Point of Measurement: From Medial Instep 44.5 cm Ankle Left: Right: Point of Measurement: From Medial Instep 23.3 cm Vascular Assessment Pulses: Dorsalis Pedis Palpable: [Right:Yes] Electronic Signature(s) Signed: 03/06/2022 4:37:16 PM By: Samuella Bruin Entered By: Samuella Bruin on 03/06/2022 15:17:47 Multi Wound Chart Details -------------------------------------------------------------------------------- [name] ([id number]) [id number] Page 3 of 8 Patient Name: Date of Service: [name], [name] 03/06/2022 3:00 PM Medical Record Number: [id number] Patient Account Number: [id number] Date  of Birth/Sex: Treating RN: 01/27/1961 (61 y.o. F) Primary Care Syanne Looney: Gerilyn PilgrimMICHA EL, BA RBA RA Other Clinician: Referring Kainen Struckman: Treating Nello Corro/Extender: Renelda Momannon, Jennifer MICHA EL, BA RBA RA Weeks in Treatment: 6 Vital Signs Height(in): Pulse(bpm): 102 Weight(lbs): Blood Pressure(mmHg): 126/64 Body Mass Index(BMI): Temperature(F): 98 Respiratory Rate(breaths/min): 18 Wound Assessments Wound Number: 2 N/A N/A Photos: N/A N/A Right, Posterior Lower Leg N/A N/A Wound Location: Insect Bite N/A N/A Wounding Event: Venous Leg Ulcer N/A N/A Primary Etiology: Anemia, Sleep Apnea, Hypertension, N/A N/A Comorbid History: Peripheral Venous Disease 11/21/2021 N/A N/A Date Acquired: 6 N/A N/A Weeks of Treatment: Open N/A N/A Wound Status: No N/A N/A Wound Recurrence: 2.3x3x0.5 N/A N/A Measurements L x W x D (cm) 5.419 N/A N/A A (cm) : rea 2.71 N/A N/A Volume (cm) : -1542.10% N/A N/A % Reduction in A rea: -2637.40% N/A N/A % Reduction in Volume: Full Thickness Without Exposed N/A N/A Classification: Support Structures Medium N/A N/A Exudate A mount: Serosanguineous N/A  N/A Exudate Type: red, brown N/A N/A Exudate Color: Distinct, outline attached N/A N/A Wound Margin: Medium (34-66%) N/A N/A Granulation A mount: Red N/A N/A Granulation Quality: Medium (34-66%) N/A N/A Necrotic A mount: Fat Layer (Subcutaneous Tissue): Yes N/A N/A Exposed Structures: Fascia: No Tendon: No Muscle: No Joint: No Bone: No None N/A N/A Epithelialization: Debridement - Excisional N/A N/A Debridement: Pre-procedure Verification/Time Out 15:29 N/A N/A Taken: Lidocaine 4% Topical Solution N/A N/A Pain Control: Subcutaneous, Slough N/A N/A Tissue Debrided: Skin/Subcutaneous Tissue N/A N/A Level: 6.9 N/A N/A Debridement A (sq cm): rea Curette N/A N/A Instrument: Minimum N/A N/A Bleeding: Pressure N/A N/A Hemostasis A chieved: Procedure was tolerated well N/A N/A Debridement Treatment Response: 2.3x3x0.5 N/A N/A Post Debridement Measurements L x W x D (cm) 2.71 N/A N/A Post Debridement Volume: (cm) Excoriation: Yes N/A N/A Periwound Skin Texture: No Abnormalities Noted N/A N/A Periwound Skin Moisture: Erythema: Yes N/A N/A Periwound Skin Color: Rubor: Yes Atrophie Blanche: No Ecchymosis: No No Abnormality N/A N/A Temperature: Compression Therapy N/A N/A Procedures Performed: Debridement Treatment Notes Hilton CorkWATKINS, Kaylei H ([id number]) [id number] Page 4 of 8 Wound #2 (Lower Leg) Wound Laterality: Right, Posterior Cleanser Soap and Water Discharge Instruction: May shower and wash wound with dial antibacterial soap and water prior to dressing change. Wound Cleanser Discharge Instruction: Cleanse the wound with wound cleanser prior to applying a clean dressing using gauze sponges, not tissue or cotton balls. Peri-Wound Care Sween Lotion (Moisturizing lotion) Discharge Instruction: Apply moisturizing lotion as directed Topical Triamcinolone Discharge Instruction: Apply Triamcinolone as directed Primary  Dressing Anasept Antimicrobial Wound Gel, 1.5 (oz) tube Discharge Instruction: moisten gauze with anasept and pack into wound Secondary Dressing Woven Gauze Sponge, Non-Sterile 4x4 in Discharge Instruction: Apply over primary dressing as directed. Secured With Compression Wrap ThreePress (3 layer compression wrap) Discharge Instruction: Apply three layer compression as directed. Unnaboot w/Calamine, 4x10 (in/yd) Discharge Instruction: Apply Unnaboot at top of leg Compression Stockings Add-Ons Electronic Signature(s) Signed: 03/07/2022 7:35:53 AM By: Duanne Guessannon, Jennifer MD FACS Entered By: Duanne Guessannon, Jennifer on 03/07/2022 07:35:53 -------------------------------------------------------------------------------- Multi-Disciplinary Care Plan Details Patient Name: Date of Service: [name], [name] 03/06/2022 3:00 PM Medical Record Number: [id number] Patient Account Number: [id number] Date of Birth/Sex: Treating RN: 03/17/1961 (61 y.o. [name] PhenixF) Herrington, [place] Primary Care Leshaun Biebel: Gerilyn PilgrimMICHA EL, [facility] Other Clinician: Referring Caron Ode: Treating Chrysten Woulfe/Extender: Renelda Momannon, Jennifer MICHA EL, BA RBA RA Weeks in Treatment: 6 Multidisciplinary Care Plan reviewed with physician Active Inactive Necrotic Tissue Nursing Diagnoses: Impaired tissue integrity related to  necrotic/devitalized tissue Knowledge deficit related to management of necrotic/devitalized tissue Goals: Necrotic/devitalized tissue will be minimized in the wound bed Date Initiated: 01/23/2022 Target Resolution Date: 06/06/2022 Goal Status: Active Patient/caregiver will verbalize understanding of reason and process for debridement of necrotic tissue CATLYNN, GRONDAHL ([id number]) [id number] Page 5 of 8 Date Initiated: 01/23/2022 Target Resolution Date: 06/06/2022 Goal Status: Active Interventions: Assess patient pain level pre-, during and post procedure and prior to discharge Provide education on  necrotic tissue and debridement process Treatment Activities: Apply topical anesthetic as ordered : 01/23/2022 Notes: Wound/Skin Impairment Nursing Diagnoses: Impaired tissue integrity Knowledge deficit related to ulceration/compromised skin integrity Goals: Patient/caregiver will verbalize understanding of skin care regimen Date Initiated: 01/23/2022 Target Resolution Date: 06/06/2022 Goal Status: Active Interventions: Assess patient/caregiver ability to obtain necessary supplies Assess ulceration(s) every visit Treatment Activities: Skin care regimen initiated : 01/23/2022 Topical wound management initiated : 01/23/2022 Notes: Electronic Signature(s) Signed: 03/06/2022 4:37:16 PM By: Samuella Bruin Entered By: Samuella Bruin on 03/06/2022 15:31:13 -------------------------------------------------------------------------------- Pain Assessment Details Patient Name: Date of Service: [name], [name] 03/06/2022 3:00 PM Medical Record Number: [id number] Patient Account Number: [id number] Date of Birth/Sex: Treating RN: 03/26/1961 (61 y.o. [name] Primary Care Kerah Hardebeck: Gerilyn Pilgrim, [facility] RBA RA Other Clinician: Referring Adylee Leonardo: Treating Ondria Oswald/Extender: Renelda Mom EL, BA RBA RA Weeks in Treatment: 6 Active Problems Location of Pain Severity and Description of Pain Patient Has Paino No Site Locations Rate the pain. Current Pain Level: 0 JULIETTA, BATTERMAN ([id number]) [id number] Page 6 of 8 Pain Management and Medication Current Pain Management: Electronic Signature(s) Signed: 03/06/2022 4:37:16 PM By: Samuella Bruin Entered By: Samuella Bruin on 03/06/2022 15:14:42 -------------------------------------------------------------------------------- Patient/Caregiver Education Details Patient Name: Date of Service: [name], [name] [name] 11/29/2023andnbsp3:00 PM Medical Record Number: [id number] Patient  Account Number: [id number] Date of Birth/Gender: Treating RN: 04-05-1961 (61 y.o. [name] Primary Care Physician: Gerilyn Pilgrim, [facility] RBA RA Other Clinician: Referring Physician: Treating Physician/Extender: Renelda Mom EL, BA RBA RA Weeks in Treatment: 6 Education Assessment Education Provided To: Patient Education Topics Provided Wound Debridement: Methods: Explain/Verbal Responses: Reinforcements needed, State content correctly Electronic Signature(s) Signed: 03/06/2022 4:37:16 PM By: Samuella Bruin Entered By: Samuella Bruin on 03/06/2022 15:31:23 -------------------------------------------------------------------------------- Wound Assessment Details Patient Name: Date of Service: [name], [name] 03/06/2022 3:00 PM Medical Record Number: [id number] Patient Account Number: [id number] Date of Birth/Sex: Treating RN: 08-12-1960 (61 y.o. [name] Primary Care Markham Dumlao: Gerilyn Pilgrim, [facility] RBA RA Other Clinician: Referring Ajwa Kimberley: Treating Aliea Bobe/Extender: Renelda Mom EL, BA RBA RA Weeks in Treatment: 6 Wound Status Wound Number: 2 Primary Venous Leg Ulcer Etiology: Wound Location: Right, Posterior Lower Leg Wound Status: Open Wounding Event: Insect Bite Comorbid Anemia, Sleep Apnea, Hypertension, Peripheral Venous Date Acquired: 11/21/2021 History: Disease Weeks Of Treatment: 6 Clustered Wound: No Photos CARLE, DARGAN ([id number]) [id number] Page 7 of 8 Wound Measurements Length: (cm) 2.3 Width: (cm) 3 Depth: (cm) 0.5 Area: (cm) 5.419 Volume: (cm) 2.71 % Reduction in Area: -1542.1% % Reduction in Volume: -2637.4% Epithelialization: None Tunneling: No Undermining: No Wound Description Classification: Full Thickness Without Exposed Support Structures Wound Margin: Distinct, outline attached Exudate Amount: Medium Exudate Type: Serosanguineous Exudate Color: red, brown Foul Odor  After Cleansing: No Slough/Fibrino Yes Wound Bed Granulation Amount: Medium (34-66%) Exposed Structure Granulation Quality: Red Fascia Exposed: No Necrotic Amount: Medium (34-66%) Fat Layer (Subcutaneous Tissue) Exposed: Yes Necrotic Quality: Adherent Slough Tendon Exposed: No Muscle Exposed: No Joint Exposed: No Bone Exposed:  No Periwound Skin Texture Texture Color No Abnormalities Noted: No No Abnormalities Noted: No Excoriation: Yes Atrophie Blanche: No Ecchymosis: No Moisture Erythema: Yes No Abnormalities Noted: Yes Rubor: Yes Temperature / Pain Temperature: No Abnormality Treatment Notes Wound #2 (Lower Leg) Wound Laterality: Right, Posterior Cleanser Soap and Water Discharge Instruction: May shower and wash wound with dial antibacterial soap and water prior to dressing change. Wound Cleanser Discharge Instruction: Cleanse the wound with wound cleanser prior to applying a clean dressing using gauze sponges, not tissue or cotton balls. Peri-Wound Care Sween Lotion (Moisturizing lotion) Discharge Instruction: Apply moisturizing lotion as directed Topical Triamcinolone Discharge Instruction: Apply Triamcinolone as directed Primary Dressing Anasept Antimicrobial Wound Gel, 1.5 (oz) tube Discharge Instruction: moisten gauze with anasept and pack into wound Secondary Dressing Woven Gauze Sponge, Non-Sterile 4x4 in Discharge Instruction: Apply over primary dressing as directed. Secured With MARYSUE, FAIT ([id number]) [id number] Page 8 of 8 Compression Wrap ThreePress (3 layer compression wrap) Discharge Instruction: Apply three layer compression as directed. Unnaboot w/Calamine, 4x10 (in/yd) Discharge Instruction: Apply Unnaboot at top of leg Compression Stockings Add-Ons Electronic Signature(s) Signed: 03/06/2022 4:37:16 PM By: Samuella Bruin Entered By: Samuella Bruin on 03/06/2022  15:23:06 -------------------------------------------------------------------------------- Vitals Details Patient Name: Date of Service: [name], [name] 03/06/2022 3:00 PM Medical Record Number: [id number] Patient Account Number: [id number] Date of Birth/Sex: Treating RN: 1960-10-19 (61 y.o. [name] Primary Care Joene Gelder: Gerilyn Pilgrim, BA RBA RA Other Clinician: Referring Juliene Kirsh: Treating Julie Paolini/Extender: Renelda Mom EL, BA RBA RA Weeks in Treatment: 6 Vital Signs Time Taken: 15:14 Temperature (F): 98 Pulse (bpm): 102 Respiratory Rate (breaths/min): 18 Blood Pressure (mmHg): 126/64 Reference Range: 80 - 120 mg / dl Electronic Signature(s) Signed: 03/06/2022 4:37:16 PM By: Samuella Bruin Entered By: Samuella Bruin on 03/06/2022 15:14:29

## 2022-03-06 NOTE — Progress Notes (Signed)
NOELINE, PONTRELLI ([id number]) [id number] Page 1 of 3 Visit Report for 03/06/2022 Debridement Details Patient Name: Date of Service: [place], [name] 03/06/2022 3:00 PM Medical Record Number: [id number] Patient Account Number: [id number] Date of Birth/Sex: Treating RN: 28-Oct-1960 (61 y.o. [name] Primary Care Provider: Cecille Po, BA RBA RA Other Clinician: Referring Provider: Treating Provider/Extender: Bettey Mare EL, BA RBA RA Weeks in Treatment: 6 Debridement Performed for Assessment: Wound #2 Right,Posterior Lower Leg Performed By: Physician Fredirick Maudlin, MD Debridement Type: Debridement Severity of Tissue Pre Debridement: Fat layer exposed Level of Consciousness (Pre-procedure): Awake and Alert Pre-procedure Verification/Time Out Yes - 15:29 Taken: Start Time: 15:29 Pain Control: Lidocaine 4% T opical Solution T Area Debrided (L x W): otal 2.3 (cm) x 3 (cm) = 6.9 (cm) Tissue and other material debrided: Non-Viable, Slough, Subcutaneous, Slough Level: Skin/Subcutaneous Tissue Debridement Description: Excisional Instrument: Curette Bleeding: Minimum Hemostasis Achieved: Pressure Response to Treatment: Procedure was tolerated well Level of Consciousness (Post- Awake and Alert procedure): Post Debridement Measurements of Total Wound Length: (cm) 2.3 Width: (cm) 3 Depth: (cm) 0.5 Volume: (cm) 2.71 Character of Wound/Ulcer Post Debridement: Improved Severity of Tissue Post Debridement: Fat layer exposed Post Procedure Diagnosis Same as Pre-procedure Notes Scribed for Dr. Celine Ahr by Adline Peals, RN Electronic Signature(s) Signed: 03/06/2022 4:27:34 PM By: Fredirick Maudlin MD FACS Signed: 03/06/2022 4:37:16 PM By: Adline Peals Entered By: Adline Peals on 03/06/2022 15:30:39 -------------------------------------------------------------------------------- Physician Orders Details Patient Name:  Date of Service: [place], [name] 03/06/2022 3:00 PM Medical Record Number: [id number] Patient Account Number: [id number] Date of Birth/Sex: Treating RN: Jul 11, 1960 (61 y.o. [name] Primary Care Provider: Cecille Po, [facility] RBA RA Other Clinician: Referring Provider: Treating Provider/Extender: Bettey Mare EL, BA RBA RA Weeks in Treatment: 6 Verbal / Phone Orders: No SHADA, SHOLTZ ([id number]) [id number] Page 2 of 3 Diagnosis Coding Follow-up Appointments ppointment in 1 week. - Dr. Celine Ahr - room 2 Return A Anesthetic (In clinic) Topical Lidocaine 4% applied to wound bed - Used in Clinic Bathing/ Shower/ Hygiene May shower with protection but do not get wound dressing(s) wet. Edema Control - Lymphedema / SCD / Other Bilateral Lower Extremities Elevate legs to the level of the heart or above for 30 minutes daily and/or when sitting, a frequency of: - throughout the day Avoid standing for long periods of time. Exercise regularly Moisturize legs daily. Compression stocking or Garment 20-30 mm/Hg pressure to: - left leg daily. Apply first thing in the morning, remove at night. Wound Treatment Wound #2 - Lower Leg Wound Laterality: Right, Posterior Cleanser: Soap and Water 1 x Per Week/30 Days Discharge Instructions: May shower and wash wound with dial antibacterial soap and water prior to dressing change. Cleanser: Wound Cleanser 1 x Per Week/30 Days Discharge Instructions: Cleanse the wound with wound cleanser prior to applying a clean dressing using gauze sponges, not tissue or cotton balls. Peri-Wound Care: Sween Lotion (Moisturizing lotion) 1 x Per Week/30 Days Discharge Instructions: Apply moisturizing lotion as directed Topical: Triamcinolone 1 x Per Week/30 Days Discharge Instructions: Apply Triamcinolone as directed Prim Dressing: Anasept Antimicrobial Wound Gel, 1.5 (oz) tube 1 x Per Week/30 Days ary Discharge  Instructions: moisten gauze with anasept and pack into wound Secondary Dressing: Woven Gauze Sponge, Non-Sterile 4x4 in 1 x Per Week/30 Days Discharge Instructions: Apply over primary dressing as directed. Compression Wrap: ThreePress (3 layer compression wrap) 1 x Per Week/30 Days Discharge Instructions: Apply three layer compression as directed. Compression  Wrap: Unnaboot w/Calamine, 4x10 (in/yd) 1 x Per Week/30 Days Discharge Instructions: Apply Unnaboot at top of leg Patient Medications llergies: Iodinated Contrast Media, Sulfa (Sulfonamide Antibiotics) A Notifications Medication Indication Start End 03/06/2022 lidocaine DOSE topical 4 % cream - cream topical Electronic Signature(s) Signed: 03/06/2022 4:27:34 PM By: [name] MD FACS Signed: 03/06/2022 4:37:16 PM By: Samuella Bruin Entered By: Samuella Bruin on 03/06/2022 15:31:07 -------------------------------------------------------------------------------- SuperBill Details Patient Name: Date of Service: [name], [name] 03/06/2022 Medical Record Number: [id number] Patient Account Number: [id number] Date of Birth/Sex: Treating RN: 05-14-1960 (61 y.o. Debara Pickett, [place] Primary Care Provider: Gerilyn Pilgrim, [facility] RBA RA Other Clinician: Referring Provider: Treating Provider/Extender: Renelda Mom EL, BA RBA RA Weeks in Treatment: [address] 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 Morbid (severe) obesity due to excess calories I10 Essential (primary) hypertension R60.0 Localized edema Facility Procedures : CPT4 Code: [id number] Description: 11042 - DEB SUBQ TISSUE 20 SQ CM/< ICD-10 Diagnosis Description L97.212 Non-pressure chronic ulcer of right calf with fat layer exposed Modifier: Quantity: 1 Physician Procedures : CPT4 Code Description  Modifier [id number] 11042 - WC PHYS SUBQ TISS 20 SQ CM ICD-10 Diagnosis Description L97.212 Non-pressure chronic ulcer of right calf with fat layer exposed Quantity: 1 Electronic Signature(s) Unsigned Entered By: Shawn Stall on 03/06/2022 17:04:56 Signature(s): Date(s):

## 2022-03-14 ENCOUNTER — Encounter (HOSPITAL_BASED_OUTPATIENT_CLINIC_OR_DEPARTMENT_OTHER): Payer: BC Managed Care – PPO | Attending: General Surgery | Admitting: General Surgery

## 2022-03-14 DIAGNOSIS — L97212 Non-pressure chronic ulcer of right calf with fat layer exposed: Secondary | ICD-10-CM | POA: Insufficient documentation

## 2022-03-14 DIAGNOSIS — I10 Essential (primary) hypertension: Secondary | ICD-10-CM | POA: Insufficient documentation

## 2022-03-14 DIAGNOSIS — I872 Venous insufficiency (chronic) (peripheral): Secondary | ICD-10-CM | POA: Diagnosis not present

## 2022-03-15 NOTE — Progress Notes (Signed)
[name], [name] ([id number]) [id number] Page 1 of 8 Visit Report for 03/14/2022 Arrival Information Details Patient Name: [place] of Service[name] 03/14/2022 7:45 A M Medical Record Number: [id number] Patient Account Number: [id number] Date of Birth/Sex: Treating RN: 08/17/1960 (61 y.o. [name]) [name] Primary Care [name]: [name] PilgrimMICHA EL, BA RBA [name] Other Clinician: Referring [name]: [name] [name]/Extender: [name] Momannon, [name] Weeks in Treatment: 7 Visit Information History Since Last Visit Added or deleted any medications: No Patient Arrived: Ambulatory Any new allergies or adverse reactions: No Arrival Time: 07:46 Had a fall or experienced change in No Accompanied By: self activities of daily living that may affect Transfer Assistance: None risk of falls: Patient Identification Verified: Yes Signs or symptoms of abuse/neglect since last visito No Secondary Verification Process Completed: Yes Hospitalized since last visit: No Patient Requires Transmission-Based Precautions: No Implantable device outside of the clinic excluding No Patient Has Alerts: No cellular tissue based products placed in the center since last visit: Has Dressing in Place as Prescribed: Yes Has Compression in Place as Prescribed: Yes Pain Present Now: No Electronic Signature(s) Signed: 03/14/2022 3:45:47 PM By: [name] Entered By: [name] on 03/14/2022 07:46:51 -------------------------------------------------------------------------------- Compression Therapy Details Patient Name: Date of Service: [name] 03/14/2022 7:45 A M Medical Record Number: [id number] Patient Account Number: [id number] Date of Birth/Sex: Treating RN: 11/01/1960 (61 y.o. [name]) [name] Primary Care [name]: [name] PilgrimMICHA EL, [name] Other Clinician: Referring Martese Vanatta: [name] [name]/Extender: [name] [name] EL, BA RBA  [name] Weeks in Treatment: 7 Compression Therapy Performed for Wound Assessment: Wound #2 Right,Posterior Lower Leg Performed By: Clinician [name] BruinHerrington, Taylor, RN Compression Type: Three Layer Post Procedure Diagnosis Same as Pre-procedure Electronic Signature(s) Signed: 03/14/2022 3:45:47 PM By: [name] Entered By: [name] on 03/14/2022 14:44:40 [name], [name] ([id number]) [id number] Page 2 of 8 -------------------------------------------------------------------------------- Encounter Discharge Information Details Patient Name: Date of Service: [name] 03/14/2022 7:45 A M Medical Record Number: [id number] Patient Account Number: [id number] Date of Birth/Sex: Treating RN: 08/22/1960 (61 y.o. [name]) [name] Primary Care Ylonda Storr: [name] PilgrimMICHA EL, [name] Other Clinician: Referring Karry Barrilleaux: [name] [name]/Extender: [name] Momannon, [name] Weeks in Treatment: 7 Encounter Discharge Information Items Post Procedure Vitals Discharge Condition: Stable Temperature (F): 98.4 Ambulatory Status: Ambulatory Pulse (bpm): 100 Discharge Destination: Home Respiratory Rate (breaths/min): 18 Transportation: Private Auto Blood Pressure (mmHg): 156/89 Accompanied By: self Schedule Follow-up Appointment: Yes Clinical Summary of Care: Patient Declined Electronic Signature(s) Signed: 03/14/2022 3:45:47 PM By: [name] Entered By: [name] on 03/14/2022 08:03:34 -------------------------------------------------------------------------------- Lower Extremity Assessment Details Patient Name: Date of Service: [name] 03/14/2022 7:45 A M Medical Record Number: [id number] Patient Account Number: [id number] Date of Birth/Sex: Treating RN: 02/15/1961 (61 y.o. [name]) [name] Primary Care [name]: [name] PilgrimMICHA EL, [name] Other Clinician: Referring [name]: [name] Momannon,  [name] Weeks in Treatment: 7 Edema Assessment Assessed: [Left: No] [Right: No] [Left: Edema] [Right: :] Calf Left: Right: Point of Measurement: From Medial Instep 47 cm Ankle Left: Right: Point of Measurement: From Medial Instep 23.3 cm Vascular Assessment Pulses: Dorsalis Pedis Palpable: [Right:Yes] Electronic Signature(s) Signed: 03/14/2022 3:45:47 PM By: [name] Entered By: [name] on 03/14/2022 07:53:26 Multi Wound Chart Details -------------------------------------------------------------------------------- [name], [name] ([id number]) [id number] Page 3 of 8 Patient Name: Date of Service: [place] [name] [name] 03/14/2022 7:45 A M Medical Record Number: [id number]  Patient Account Number: [id number] Date of Birth/Sex: Treating RN: 1960-05-15 (61 y.o. F) Primary Care Yavuz Kirby: [name], BA RBA [name] Other Clinician: Referring [name]: [name] [name]/Extender: [name] Mom EL, BA RBA [name] Weeks in Treatment: 7 Vital Signs Height(in): Pulse(bpm): 100 Weight(lbs): Blood Pressure(mmHg): 156/89 Body Mass Index(BMI): Temperature(F): 98.4 Respiratory Rate(breaths/min): 18 Wound Assessments Wound Number: 2 N/A N/A Photos: N/A N/A Right, Posterior Lower Leg N/A N/A Wound Location: Insect Bite N/A N/A Wounding Event: Venous Leg Ulcer N/A N/A Primary Etiology: Anemia, Sleep Apnea, Hypertension, N/A N/A Comorbid History: Peripheral Venous Disease 11/21/2021 N/A N/A Date Acquired: 7 N/A N/A Weeks of Treatment: Open N/A N/A Wound Status: No N/A N/A Wound Recurrence: 3x3.1x0.4 N/A N/A Measurements L x W x D (cm) 7.304 N/A N/A A (cm) : rea 2.922 N/A N/A Volume (cm) : -2113.30% N/A N/A % Reduction in A rea: -2851.50% N/A N/A % Reduction in Volume: Full Thickness Without Exposed N/A N/A Classification: Support Structures Medium N/A N/A Exudate A mount: Serosanguineous N/A  N/A Exudate Type: red, brown N/A N/A Exudate Color: Distinct, outline attached N/A N/A Wound Margin: Small (1-33%) N/A N/A Granulation A mount: Red, Friable N/A N/A Granulation Quality: Large (67-100%) N/A N/A Necrotic A mount: Fat Layer (Subcutaneous Tissue): Yes N/A N/A Exposed Structures: Fascia: No Tendon: No Muscle: No Joint: No Bone: No None N/A N/A Epithelialization: Debridement - Excisional N/A N/A Debridement: Pre-procedure Verification/Time Out 08:00 N/A N/A Taken: Lidocaine 5% topical ointment N/A N/A Pain Control: Subcutaneous, Slough N/A N/A Tissue Debrided: Skin/Subcutaneous Tissue N/A N/A Level: 9.3 N/A N/A Debridement A (sq cm): rea Curette N/A N/A Instrument: Minimum N/A N/A Bleeding: Pressure N/A N/A Hemostasis A chieved: Procedure was tolerated well N/A N/A Debridement Treatment Response: 3x3.1x0.4 N/A N/A Post Debridement Measurements L x W x D (cm) 2.922 N/A N/A Post Debridement Volume: (cm) Excoriation: Yes N/A N/A Periwound Skin Texture: No Abnormalities Noted N/A N/A Periwound Skin Moisture: Erythema: Yes N/A N/A Periwound Skin Color: Atrophie Blanche: No Ecchymosis: No Rubor: No No Abnormality N/A N/A Temperature: Debridement N/A N/A Procedures Performed: Treatment Notes [name], [name] ([id number]) [id number].pdf Page 4 of 8 Wound #2 (Lower Leg) Wound Laterality: Right, Posterior Cleanser Soap and Water Discharge Instruction: May shower and wash wound with dial antibacterial soap and water prior to dressing change. Wound Cleanser Discharge Instruction: Cleanse the wound with wound cleanser prior to applying a clean dressing using gauze sponges, not tissue or cotton balls. Peri-Wound Care Triamcinolone 15 (g) Discharge Instruction: Use triamcinolone 15 (g) as directed Sween Lotion (Moisturizing lotion) Discharge Instruction: Apply moisturizing lotion as directed Topical Primary  Dressing Sorbalgon AG Dressing 2x2 (in/in) Discharge Instruction: Apply to wound bed as instructed Secondary Dressing Woven Gauze Sponge, Non-Sterile 4x4 in Discharge Instruction: Apply over primary dressing as directed. Zetuvit Plus 4x8 in Discharge Instruction: Apply over primary dressing as directed. Secured With Compression Wrap ThreePress (3 layer compression wrap) Discharge Instruction: Apply three layer compression as directed. Unnaboot w/Calamine, 4x10 (in/yd) Discharge Instruction: Apply Unnaboot at top of leg Compression Stockings Add-Ons Electronic Signature(s) Signed: 03/14/2022 8:10:38 AM By: [name] MD FACS Entered By: [name] on 03/14/2022 08:10:38 -------------------------------------------------------------------------------- Multi-Disciplinary Care Plan Details Patient Name: Date of Service: [place], [name] 03/14/2022 7:45 A M Medical Record Number: [id number] Patient Account Number: [id number] Date of Birth/Sex: Treating RN: 1960/06/21 (61 y.o. [name] Primary Care Myracle Febres: [name], [facility] RBA [name] Other Clinician: Referring Rey Fors: [name] Charlesetta Milliron/Extender: [name] Mom EL, BA RBA [name] Weeks in Treatment: 7  Multidisciplinary Care Plan reviewed with physician Active Inactive Necrotic Tissue Nursing Diagnoses: Impaired tissue integrity related to necrotic/devitalized tissue Knowledge deficit related to management of necrotic/devitalized tissue Goals: Necrotic/devitalized tissue will be minimized in the wound bed [name], [name] ([id number]) [id number] Page 5 of 8 Date Initiated: 01/23/2022 Target Resolution Date: 06/06/2022 Goal Status: Active Patient/caregiver will verbalize understanding of reason and process for debridement of necrotic tissue Date Initiated: 01/23/2022 Target Resolution Date: 06/06/2022 Goal Status: Active Interventions: Assess patient pain level pre-, during and  post procedure and prior to discharge Provide education on necrotic tissue and debridement process Treatment Activities: Apply topical anesthetic as ordered : 01/23/2022 Notes: Wound/Skin Impairment Nursing Diagnoses: Impaired tissue integrity Knowledge deficit related to ulceration/compromised skin integrity Goals: Patient/caregiver will verbalize understanding of skin care regimen Date Initiated: 01/23/2022 Target Resolution Date: 06/06/2022 Goal Status: Active Interventions: Assess patient/caregiver ability to obtain necessary supplies Assess ulceration(s) every visit Treatment Activities: Skin care regimen initiated : 01/23/2022 Topical wound management initiated : 01/23/2022 Notes: Electronic Signature(s) Signed: 03/14/2022 3:45:47 PM By: [name] Bruin Entered By: [name] Bruin on 03/14/2022 08:02:40 -------------------------------------------------------------------------------- Pain Assessment Details Patient Name: Date of Service: [name] Madura, [name] 03/14/2022 7:45 A M Medical Record Number: [id number] Patient Account Number: [id number] Date of Birth/Sex: Treating RN: 22-Nov-1960 (61 y.o. [name] Primary Care Kasondra Junod: [name], [facility] RBA [name] Other Clinician: Referring Mikala Podoll: [name] Teejay Meader/Extender: [name] Mom EL, BA RBA [name] Weeks in Treatment: 7 Active Problems Location of Pain Severity and Description of Pain Patient Has Paino No Site Locations Rate the pain. [name], [name] ([id number]) [id number] Page 6 of 8 Rate the pain. Current Pain Level: 0 Pain Management and Medication Current Pain Management: Electronic Signature(s) Signed: 03/14/2022 3:45:47 PM By: [name] Bruin Entered By: [name] Bruin on 03/14/2022 07:47:01 -------------------------------------------------------------------------------- Patient/Caregiver Education Details Patient Name: Date of Service: [name] Madura, [name] [name]  12/7/2023andnbsp7:45 A M Medical Record Number: [id number] Patient Account Number: [id number] Date of Birth/Gender: Treating RN: 1961-02-06 (61 y.o. [name] Primary Care Physician: [name], [facility] RBA [name] Other Clinician: Referring Physician: Treating Physician/Extender: [name] Mom EL, BA RBA [name] Weeks in Treatment: 7 Education Assessment Education Provided To: Patient Education Topics Provided Wound Debridement: Methods: Explain/Verbal Responses: Reinforcements needed, State content correctly Electronic Signature(s) Signed: 03/14/2022 3:45:47 PM By: [name] Bruin Entered By: [name] Bruin on 03/14/2022 08:02:52 -------------------------------------------------------------------------------- Wound Assessment Details Patient Name: Date of Service: [name] Madura, [name] 03/14/2022 7:45 A M Medical Record Number: [id number] Patient Account Number: [id number] Date of Birth/Sex: Treating RN: 05-Apr-1961 (61 y.o. [name] Primary Care Willona Phariss: [name], [facility] RBA [name] Other Clinician: Referring Tahsin Benyo: [name] Montray Kliebert/Extender: Latrelle Dodrill, BA RBA [name] [name], [name] ([id number]) [id number] Page 7 of 8 Weeks in Treatment: 7 Wound Status Wound Number: 2 Primary Venous Leg Ulcer Etiology: Wound Location: Right, Posterior Lower Leg Wound Status: Open Wounding Event: Insect Bite Comorbid Anemia, Sleep Apnea, Hypertension, Peripheral Venous Date Acquired: 11/21/2021 History: Disease Weeks Of Treatment: 7 Clustered Wound: No Photos Wound Measurements Length: (cm) 3 Width: (cm) 3.1 Depth: (cm) 0.4 Area: (cm) 7.304 Volume: (cm) 2.922 % Reduction in Area: -2113.3% % Reduction in Volume: -2851.5% Epithelialization: None Tunneling: No Undermining: No Wound Description Classification: Full Thickness Without Exposed Support Structures Wound Margin: Distinct, outline attached Exudate Amount:  Medium Exudate Type: Serosanguineous Exudate Color: red, brown Foul Odor After Cleansing: No Slough/Fibrino Yes Wound Bed Granulation Amount: Small (1-33%) Exposed Structure Granulation Quality: Red, Friable Fascia Exposed: No Necrotic  Amount: Large (67-100%) Fat Layer (Subcutaneous Tissue) Exposed: Yes Necrotic Quality: Adherent Slough Tendon Exposed: No Muscle Exposed: No Joint Exposed: No Bone Exposed: No Periwound Skin Texture Texture Color No Abnormalities Noted: No No Abnormalities Noted: No Excoriation: Yes Atrophie Blanche: No Ecchymosis: No Moisture Erythema: Yes No Abnormalities Noted: Yes Rubor: No Temperature / Pain Temperature: No Abnormality Treatment Notes Wound #2 (Lower Leg) Wound Laterality: Right, Posterior Cleanser Soap and Water Discharge Instruction: May shower and wash wound with dial antibacterial soap and water prior to dressing change. Wound Cleanser Discharge Instruction: Cleanse the wound with wound cleanser prior to applying a clean dressing using gauze sponges, not tissue or cotton balls. Peri-Wound Care Triamcinolone 15 (g) Discharge Instruction: Use triamcinolone 15 (g) as directed Sween Lotion (Moisturizing lotion) Discharge Instruction: Apply moisturizing lotion as directed [name], [name] ([id number]) [id number] Page 8 of 8 Topical Primary Dressing Sorbalgon AG Dressing 2x2 (in/in) Discharge Instruction: Apply to wound bed as instructed Secondary Dressing Woven Gauze Sponge, Non-Sterile 4x4 in Discharge Instruction: Apply over primary dressing as directed. Zetuvit Plus 4x8 in Discharge Instruction: Apply over primary dressing as directed. Secured With Compression Wrap ThreePress (3 layer compression wrap) Discharge Instruction: Apply three layer compression as directed. Unnaboot w/Calamine, 4x10 (in/yd) Discharge Instruction: Apply Unnaboot at top of leg Compression Stockings Add-Ons Electronic  Signature(s) Signed: 03/14/2022 3:45:47 PM By: [name] Bruin Entered By: [name] Bruin on 03/14/2022 07:57:11 -------------------------------------------------------------------------------- Vitals Details Patient Name: Date of Service: [name] Madura, [name] 03/14/2022 7:45 A M Medical Record Number: [id number] Patient Account Number: [id number] Date of Birth/Sex: Treating RN: [birth year] (61 y.o. [name] Primary Care Keilan Nichol: [name], BA RBA [name] Other Clinician: Referring Zacharee Gaddie: [name] Errin Whitelaw/Extender: [name] Mom EL, BA RBA [name] Weeks in Treatment: 7 Vital Signs Time Taken: 07:47 Temperature (F): 98.4 Pulse (bpm): 100 Respiratory Rate (breaths/min): 18 Blood Pressure (mmHg): 156/89 Reference Range: 80 - 120 mg / dl Electronic Signature(s) Signed: 03/14/2022 3:45:47 PM By: [name] Bruin Entered By: [name] Bruin on 03/14/2022 07:47:44

## 2022-03-15 NOTE — Progress Notes (Signed)
[name], [name] ([id number]) [phone].pdf Page 1 of 11 Visit Report for 03/14/2022 Chief Complaint Document Details Patient Name: Date of Service: [name], [name] 03/14/2022 7:45 A M Medical Record Number: [id number] Patient Account Number: [id number] Date of Birth/Sex: Treating RN: 03-08-1961 (61 y.o. F) Primary Care Provider: Cecille Po, [facility] RBA RA Other Clinician: Referring Provider: Treating Provider/Extender: Bettey Mare EL, BA RBA RA Weeks in Treatment: 7 Information Obtained from: Patient Chief Complaint RLE ulcer Electronic Signature(s) Signed: 03/14/2022 8:10:46 AM By: Fredirick Maudlin MD FACS Entered By: Fredirick Maudlin on 03/14/2022 08:10:45 -------------------------------------------------------------------------------- Debridement Details Patient Name: Date of Service: [name], [name] 03/14/2022 7:45 A M Medical Record Number: [id number] Patient Account Number: [id number] Date of Birth/Sex: Treating RN: [birth year] (61 y.o. [name] Primary Care Provider: Cecille Po, BA RBA RA Other Clinician: Referring Provider: Treating Provider/Extender: Bettey Mare EL, BA RBA RA Weeks in Treatment: 7 Debridement Performed for Assessment: Wound #2 Right,Posterior Lower Leg Performed By: Physician Fredirick Maudlin, MD Debridement Type: Debridement Severity of Tissue Pre Debridement: Fat layer exposed Level of Consciousness (Pre-procedure): Awake and Alert Pre-procedure Verification/Time Out Yes - 08:00 Taken: Start Time: 08:00 Pain Control: Lidocaine 5% topical ointment T Area Debrided (L x W): otal 3 (cm) x 3.1 (cm) = 9.3 (cm) Tissue and other material debrided: Non-Viable, Slough, Subcutaneous, Slough Level: Skin/Subcutaneous Tissue Debridement Description: Excisional Instrument: Curette Bleeding: Minimum Hemostasis Achieved: Pressure Response to Treatment: Procedure was tolerated well Level of Consciousness  (Post- Awake and Alert procedure): Post Debridement Measurements of Total Wound Length: (cm) 3 Width: (cm) 3.1 Depth: (cm) 0.4 Volume: (cm) 2.922 Character of Wound/Ulcer Post Debridement: Improved Severity of Tissue Post Debridement: Fat layer exposed Post Procedure Diagnosis Same as Pre-procedure LEANY, FRANCHINA ([id number]) [id number] Page 2 of 11 Notes Scribed for Dr. Celine [name] by Adline Peals, RN Electronic Signature(s) Signed: 03/14/2022 10:43:45 AM By: Fredirick Maudlin MD FACS Signed: 03/14/2022 3:45:47 PM By: Sabas Sous By: Adline Peals on 03/14/2022 08:01:39 -------------------------------------------------------------------------------- HPI Details Patient Name: Date of Service: [name], [name] 03/14/2022 7:45 A M Medical Record Number: [id number] Patient Account Number: [id number] Date of Birth/Sex: Treating RN: Jun 13, 1960 (61 y.o. F) Primary Care Provider: Cecille Po, [facility] RBA RA Other Clinician: Referring Provider: Treating Provider/Extender: Bettey Mare EL, BA RBA RA Weeks in Treatment: 7 History of Present Illness HPI Description: 02/16/2020 upon evaluation today patient actually appears to be doing poorly in regard to her left medial lower extremity ulcer. This is actually an area that she tells me she has had intermittent issues with over the years although has been closed for some time she typically uses compression right now she has juxta lite compression wraps. With that being said she tells me that this nonetheless open several weeks/months ago and has been given her trouble since. She does have a history of chronic venous insufficiency she is seeing specialist for this in the past she has had an ablation as well as sclerotherapy. With that being said she also has hypertension chronically which is managed by her primary care provider. In general she seems to be worsening overall with regard to the  wound and states that she finally realized that she needed to come in and have somebody look at this and not continue to try to manage this on her own. No fevers, chills, nausea, vomiting, or diarrhea. 02/23/2020 on evaluation today patient appears to be doing well with regard to her wound. This is showing some signs  of improvement which is great news still were not quite at the point where I would like to be as far as the overall appearance of the wound is concerned but I do believe this is better than last week. I do believe the Iodoflex is helping as well. 03/08/2020 upon evaluation today patient appears to be doing well with regard to her wound. She has been tolerating the dressing changes without complication. Fortunately I feel like she has made great progress with the Iodoflex but I feel like it may be the point rest to switch to something else possibly a collagen- based dressing at this time. 03/15/2020 upon evaluation today patient appears to be doing excellent in regard to her leg ulcer. She has been tolerating the dressing changes without complication. Fortunately there is no signs of active infection. Overall she is measuring a little bit smaller today which is great news. 03/22/2020 upon evaluation today patient appears to be doing well with regard to her wound. She has been tolerating the dressing changes without complication. Fortunately there is no signs of active infection at this time. 03/28/2020; patient I do not usually see however she has a wound on the left anterior lower leg secondary to chronic venous insufficiency we have been using silver collagen under compression. She arrives in clinic with a nonviable surface requiring debridement 04/12/2020 upon evaluation today patient appears to be doing well all things considered with regard to her leg ulcer. She is tolerating the dressing changes without complication there is minimal dry skin around the edges of the wound that may be  trapping and stopping some of the events of the new skin I am can work on that today. Otherwise the surface of the wound appears to be doing excellent. 04/19/2020 upon evaluation today patient appears to be doing well with regard to her leg ulcer. She has been tolerating dressing changes without complication. Fortunately there is no signs of active infection at this time. No fever chills noted overall very pleased with how things seem to be progressing. 04/26/2020 on evaluation today patient appears to be doing well with regard to her wound currently. Is showing signs of excellent improvement overall is filling in nicely and there does not appear to be any signs of infection. No fevers, chills, nausea, vomiting, or diarrhea. 05/03/2020 upon evaluation today patient appears to be doing well with regard to her wound on the leg. This overall showing signs of good improvement which is great she has some good epithelial growth and overall I think that things are moving in the correct direction. We likewise going to continue with the wound care measures as before since she seems to making such good improvement. 2/3; venous wound on the left medial leg. This is contracting. We are using Prisma and 3 layer compression. She has a stocking and waiting in the eventuality this heals. She is already using it on the right 05/17/2020 upon evaluation today patient appears to be doing well with regard to her leg ulcer. She has been tolerating the dressing changes without complication. Fortunately there is no signs of active infection which is great news and overall very pleased with where things stand today. No fevers, chills, nausea, vomiting, or diarrhea. 05/24/2020 upon evaluation today patient appears to be doing well with regard to her wound. Overall I feel like she is making excellent progress. There does not appear to be any signs of active infection which is great news. 05/31/2020 upon evaluation today patient  appears to be  doing well with regard to her wound. There does not appear to be any signs of active infection which is great news overall I am extremely pleased with where things stand today. READMISSION 01/23/2022 She returns to clinic today with a new wound on her right posterior calf. She says that she was cleaning out an old shed near the middle of August this year and then noticed what seemed to be a bug bite on her right posterior calf. It was itchy, red, and raised. By the end of September, an ulcer had developed. She has been applying various topical creams such as hydrocortisone and others to the site. When it was not improving, she made an appointment in the wound care MEIRAV, SAS ([id number]) [phone].pdf Page 3 of 11 center. ABI in clinic today was 0.97. On her right posterior calf, there is a circular wound with necrotic fat and black eschar present. There is no purulent drainage or malodor. The periwound skin is in good condition with just a little induration that appears to be secondary to inflammation. 01/31/2022: The wound measures a little bit larger today, but overall is quite a bit cleaner. There is some undermining from 9:00 to 3:00. She is having some periwound itching and says that her wrap slid. 02/08/2022: Despite using an Unna boot first layer at the top of her wrap, it slid again and it looks like there is been some bruising at the wound site. The wound is about the same size in terms of dimensions. There is a fair amount of slough and nonviable tissue still present. Edema control is better than last week. 02/15/2022: The wound dimensions are about the same. There is less nonviable tissue present. The periwound erythema has improved. 02/22/2022: The wound has deteriorated over the past week. It is larger and the periwound is more edematous, erythematous, and indurated. It looks as though there has been more tissue breakdown with undermining  present. She is having more pain. There is a foul odor coming from the wound. 02/26/2022: The wound looks quite a bit better today and the odor is gone. I still do not have her culture data back, but she seems to be responding well to the Augmentin. 03/06/2022: Her wound continues to improve. There is still some slough accumulation, but the periwound skin is less inflamed. Her culture returned with a polymicrobial population including Pseudomonas and so levofloxacin was also prescribed. She did not understand why a second antibiotic was being added so she has not yet initiated this. 03/14/2022: The wound is about the same, to perhaps a little bit larger. There is a fair amount of slough accumulation on the surface, as well as some hypertrophic granulation tissue. She is still taking levofloxacin, but has completed taking Augmentin. She has her [employer] compound with her today. Electronic Signature(s) Signed: 03/14/2022 8:11:48 AM By: Fredirick Maudlin MD FACS Entered By: Fredirick Maudlin on 03/14/2022 08:11:48 -------------------------------------------------------------------------------- Physical Exam Details Patient Name: Date of Service: [name], [name] 03/14/2022 7:45 A M Medical Record Number: [id number] Patient Account Number: [id number] Date of Birth/Sex: Treating RN: Jan 17, 1961 (61 y.o. F) Primary Care Provider: Cecille Po, BA RBA RA Other Clinician: Referring Provider: Treating Provider/Extender: Bettey Mare EL, BA RBA RA Weeks in Treatment: 7 Constitutional She is hypertensive, but asymptomatic.Marland Kitchen Slightly tachycardic, asymptomatic.. . . No acute distress. Respiratory Normal work of breathing on room air. Notes 03/14/2022: The wound is about the same, to perhaps a little bit larger. There is a fair amount of  slough accumulation on the surface, as well as some hypertrophic granulation tissue. Electronic Signature(s) Signed: 03/14/2022 8:12:29 AM By: Fredirick Maudlin MD  FACS Entered By: Fredirick Maudlin on 03/14/2022 08:12:28 -------------------------------------------------------------------------------- Physician Orders Details Patient Name: Date of Service: [name], [name] 03/14/2022 7:45 A M Medical Record Number: [id number] Patient Account Number: [id number] Date of Birth/Sex: Treating RN: 1961/03/05 (61 y.o. [name] Primary Care Provider: Cecille Po, [facility] RBA RA Other Clinician: Referring Provider: Treating Provider/Extender: Bettey Mare EL, BA RBA RA Weeks in Treatment: 7 Verbal / Phone Orders: No ANIBAL, ZINGALE ([id number]) [id number] Page 4 of 11 Diagnosis Coding ICD-10 Coding Code Description [id number] Non-pressure chronic ulcer of right calf with fat layer exposed E66.01 Morbid (severe) obesity due to excess calories I10 Essential (primary) hypertension R60.0 Localized edema Follow-up Appointments ppointment in 1 week. - Dr. Celine [name] - room 2 Return A Anesthetic (In clinic) Topical Lidocaine 5% applied to wound bed Bathing/ Shower/ Hygiene May shower with protection but do not get wound dressing(s) wet. Edema Control - Lymphedema / SCD / Other Bilateral Lower Extremities Elevate legs to the level of the heart or above for 30 minutes daily and/or when sitting, a frequency of: - throughout the day Avoid standing for long periods of time. Exercise regularly Moisturize legs daily. Compression stocking or Garment 20-30 mm/Hg pressure to: - left leg daily. Apply first thing in the morning, remove at night. Wound Treatment Wound #2 - Lower Leg Wound Laterality: Right, Posterior Cleanser: Soap and Water 1 x Per Week/30 Days Discharge Instructions: May shower and wash wound with dial antibacterial soap and water prior to dressing change. Cleanser: Wound Cleanser 1 x Per Week/30 Days Discharge Instructions: Cleanse the wound with wound cleanser prior to applying a clean dressing using gauze  sponges, not tissue or cotton balls. Peri-Wound Care: Triamcinolone 15 (g) 1 x Per Week/30 Days Discharge Instructions: Use triamcinolone 15 (g) as directed Peri-Wound Care: Sween Lotion (Moisturizing lotion) 1 x Per Week/30 Days Discharge Instructions: Apply moisturizing lotion as directed Prim Dressing: Sorbalgon AG Dressing 2x2 (in/in) 1 x Per Week/30 Days ary Discharge Instructions: Apply to wound bed as instructed Secondary Dressing: Woven Gauze Sponge, Non-Sterile 4x4 in 1 x Per Week/30 Days Discharge Instructions: Apply over primary dressing as directed. Secondary Dressing: Zetuvit Plus 4x8 in 1 x Per Week/30 Days Discharge Instructions: Apply over primary dressing as directed. Compression Wrap: ThreePress (3 layer compression wrap) 1 x Per Week/30 Days Discharge Instructions: Apply three layer compression as directed. Compression Wrap: Unnaboot w/Calamine, 4x10 (in/yd) 1 x Per Week/30 Days Discharge Instructions: Apply Unnaboot at top of leg Patient Medications llergies: Iodinated Contrast Media, Sulfa (Sulfonamide Antibiotics) A Notifications Medication Indication Start End 03/14/2022 lidocaine DOSE topical 5 % ointment - ointment topical Electronic Signature(s) Signed: 03/14/2022 10:43:45 AM By: Fredirick Maudlin MD FACS Entered By: Fredirick Maudlin on 03/14/2022 08:14:47 [name] ([id number] Page 5 of 11 -------------------------------------------------------------------------------- Problem List Details Patient Name: Date of Service: [name] 03/14/2022 7:45 A M Medical Record Number: [id number] Patient Account Number: [id number] Date of Birth/Sex: Treating RN: 1960/07/06 (61 y.o. F) Primary Care Provider: Cecille Po, [facility] RBA RA Other Clinician: Referring Provider: Treating Provider/Extender: Bettey Mare EL, BA RBA RA Weeks in Treatment: 7 Active Problems ICD-10 Encounter Code Description Active Date  MDM Diagnosis L97.212 Non-pressure chronic ulcer of right calf with fat layer exposed 01/23/2022 No Yes E66.01 Morbid (severe) obesity due to excess calories 01/23/2022 No Yes I10 Essential (primary)  hypertension 01/23/2022 No Yes R60.0 Localized edema 01/23/2022 No Yes Inactive Problems Resolved Problems Electronic Signature(s) Signed: 03/14/2022 8:10:28 AM By: [name] MD FACS Entered By: [name] on 03/14/2022 08:10:27 -------------------------------------------------------------------------------- Progress Note Details Patient Name: Date of Service: [name], [name] 03/14/2022 7:45 A M Medical Record Number: [id number] Patient Account Number: [id number] Date of Birth/Sex: Treating RN: 11/16/1960 (61 y.o. F) Primary Care Provider: Gerilyn Pilgrim, [facility] RBA RA Other Clinician: Referring Provider: Treating Provider/Extender: Renelda Mom EL, BA RBA RA Weeks in Treatment: 7 Subjective Chief Complaint Information obtained from Patient RLE ulcer History of Present Illness (HPI) 02/16/2020 upon evaluation today patient actually appears to be doing poorly in regard to her left medial lower extremity ulcer. This is actually an area that she tells me she has had intermittent issues with over the years although has been closed for some time she typically uses compression right now she has juxta lite compression wraps. With that being said she tells me that this nonetheless open several weeks/months ago and has been given her trouble since. She does have a history of chronic venous insufficiency she is seeing specialist for this in the past she has had an ablation as well as sclerotherapy. With that being said she also has hypertension chronically which is managed by her primary care provider. In general she seems to be worsening overall with regard to the CARA, THAXTON ([id number]) [phone].pdf Page 6 of 11 wound and states that she finally  realized that she needed to come in and have somebody look at this and not continue to try to manage this on her own. No fevers, chills, nausea, vomiting, or diarrhea. 02/23/2020 on evaluation today patient appears to be doing well with regard to her wound. This is showing some signs of improvement which is great news still were not quite at the point where I would like to be as far as the overall appearance of the wound is concerned but I do believe this is better than last week. I do believe the Iodoflex is helping as well. 03/08/2020 upon evaluation today patient appears to be doing well with regard to her wound. She has been tolerating the dressing changes without complication. Fortunately I feel like she has made great progress with the Iodoflex but I feel like it may be the point rest to switch to something else possibly a collagen- based dressing at this time. 03/15/2020 upon evaluation today patient appears to be doing excellent in regard to her leg ulcer. She has been tolerating the dressing changes without complication. Fortunately there is no signs of active infection. Overall she is measuring a little bit smaller today which is great news. 03/22/2020 upon evaluation today patient appears to be doing well with regard to her wound. She has been tolerating the dressing changes without complication. Fortunately there is no signs of active infection at this time. 03/28/2020; patient I do not usually see however she has a wound on the left anterior lower leg secondary to chronic venous insufficiency we have been using silver collagen under compression. She arrives in clinic with a nonviable surface requiring debridement 04/12/2020 upon evaluation today patient appears to be doing well all things considered with regard to her leg ulcer. She is tolerating the dressing changes without complication there is minimal dry skin around the edges of the wound that may be trapping and stopping some of the  events of the new skin I am can work on that today. Otherwise the surface  of the wound appears to be doing excellent. 04/19/2020 upon evaluation today patient appears to be doing well with regard to her leg ulcer. She has been tolerating dressing changes without complication. Fortunately there is no signs of active infection at this time. No fever chills noted overall very pleased with how things seem to be progressing. 04/26/2020 on evaluation today patient appears to be doing well with regard to her wound currently. Is showing signs of excellent improvement overall is filling in nicely and there does not appear to be any signs of infection. No fevers, chills, nausea, vomiting, or diarrhea. 05/03/2020 upon evaluation today patient appears to be doing well with regard to her wound on the leg. This overall showing signs of good improvement which is great she has some good epithelial growth and overall I think that things are moving in the correct direction. We likewise going to continue with the wound care measures as before since she seems to making such good improvement. 2/3; venous wound on the left medial leg. This is contracting. We are using Prisma and 3 layer compression. She has a stocking and waiting in the eventuality this heals. She is already using it on the right 05/17/2020 upon evaluation today patient appears to be doing well with regard to her leg ulcer. She has been tolerating the dressing changes without complication. Fortunately there is no signs of active infection which is great news and overall very pleased with where things stand today. No fevers, chills, nausea, vomiting, or diarrhea. 05/24/2020 upon evaluation today patient appears to be doing well with regard to her wound. Overall I feel like she is making excellent progress. There does not appear to be any signs of active infection which is great news. 05/31/2020 upon evaluation today patient appears to be doing well with regard to  her wound. There does not appear to be any signs of active infection which is great news overall I am extremely pleased with where things stand today. READMISSION 01/23/2022 She returns to clinic today with a new wound on her right posterior calf. She says that she was cleaning out an old shed near the middle of August this year and then noticed what seemed to be a bug bite on her right posterior calf. It was itchy, red, and raised. By the end of September, an ulcer had developed. She has been applying various topical creams such as hydrocortisone and others to the site. When it was not improving, she made an appointment in the wound care center. ABI in clinic today was 0.97. On her right posterior calf, there is a circular wound with necrotic fat and black eschar present. There is no purulent drainage or malodor. The periwound skin is in good condition with just a little induration that appears to be secondary to inflammation. 01/31/2022: The wound measures a little bit larger today, but overall is quite a bit cleaner. There is some undermining from 9:00 to 3:00. She is having some periwound itching and says that her wrap slid. 02/08/2022: Despite using an Unna boot first layer at the top of her wrap, it slid again and it looks like there is been some bruising at the wound site. The wound is about the same size in terms of dimensions. There is a fair amount of slough and nonviable tissue still present. Edema control is better than last week. 02/15/2022: The wound dimensions are about the same. There is less nonviable tissue present. The periwound erythema has improved. 02/22/2022: The wound has deteriorated  over the past week. It is larger and the periwound is more edematous, erythematous, and indurated. It looks as though there has been more tissue breakdown with undermining present. She is having more pain. There is a foul odor coming from the wound. 02/26/2022: The wound looks quite a bit better  today and the odor is gone. I still do not have her culture data back, but she seems to be responding well to the Augmentin. 03/06/2022: Her wound continues to improve. There is still some slough accumulation, but the periwound skin is less inflamed. Her culture returned with a polymicrobial population including Pseudomonas and so levofloxacin was also prescribed. She did not understand why a second antibiotic was being added so she has not yet initiated this. 03/14/2022: The wound is about the same, to perhaps a little bit larger. There is a fair amount of slough accumulation on the surface, as well as some hypertrophic granulation tissue. She is still taking levofloxacin, but has completed taking Augmentin. She has her Jodie Echevaria compound with her today. Patient History Information obtained from Patient. Family History Cancer - Mother, Diabetes - Father, Hypertension - Mother, Kidney Disease - Mother, Lung Disease - Father, Thyroid Problems - Paternal Grandparents, No family history of Heart Disease, Seizures, Stroke, Tuberculosis. Social History Never smoker, Marital Status - Married, Alcohol Use - Never, Drug Use - No History, Caffeine Use - Daily - coffee. Medical History Eyes Denies history of Cataracts, Glaucoma, Optic Neuritis Ear/Nose/Mouth/Throat Denies history of Chronic sinus problems/congestion, Middle ear problems Hematologic/Lymphatic Patient has history of Anemia - iron STEFENIE, VEST ([id number]) [phone].pdf Page 7 of 11 Denies history of Hemophilia, Human Immunodeficiency Virus, Lymphedema, Sickle Cell Disease Respiratory Patient has history of Sleep Apnea - CPAP Denies history of Aspiration, Asthma, Chronic Obstructive Pulmonary Disease (COPD), Pneumothorax, Tuberculosis Cardiovascular Patient has history of Hypertension, Peripheral Venous Disease Denies history of Angina, Arrhythmia, Congestive Heart Failure, Coronary Artery Disease, Deep  Vein Thrombosis, Hypotension, Myocardial Infarction, Peripheral Arterial Disease, Phlebitis, Vasculitis Gastrointestinal Denies history of Cirrhosis , Colitis, Crohnoos, Hepatitis A, Hepatitis B, Hepatitis C Endocrine Denies history of Type I Diabetes, Type II Diabetes Genitourinary Denies history of End Stage Renal Disease Immunological Denies history of Lupus Erythematosus, Raynaudoos, Scleroderma Integumentary (Skin) Denies history of History of Burn Musculoskeletal Denies history of Gout, Rheumatoid Arthritis, Osteoarthritis, Osteomyelitis Neurologic Denies history of Dementia, Neuropathy, Quadriplegia, Paraplegia, Seizure Disorder Oncologic Denies history of Received Chemotherapy, Received Radiation Psychiatric Denies history of Anorexia/bulimia, Confinement Anxiety Hospitalization/Surgery History - cholecystectomy 1980s. - nephrolithasis 1980s. - plate and rod in right elbow surgery 2010. Medical A Surgical History Notes nd Genitourinary years ago kidney stones Oncologic skin Ca removed from back years ago Objective Constitutional She is hypertensive, but asymptomatic.Marland Kitchen Slightly tachycardic, asymptomatic.Marland Kitchen No acute distress. Vitals Time Taken: 7:47 AM, Temperature: 98.4 F, Pulse: 100 bpm, Respiratory Rate: 18 breaths/min, Blood Pressure: 156/89 mmHg. Respiratory Normal work of breathing on room air. General Notes: 03/14/2022: The wound is about the same, to perhaps a little bit larger. There is a fair amount of slough accumulation on the surface, as well as some hypertrophic granulation tissue. Integumentary (Hair, Skin) Wound #2 status is Open. Original cause of wound was Insect Bite. The date acquired was: 11/21/2021. The wound has been in treatment 7 weeks. The wound is located on the Right,Posterior Lower Leg. The wound measures 3cm length x 3.1cm width x 0.4cm depth; 7.304cm^2 area and 2.922cm^3 volume. There is Fat Layer (Subcutaneous Tissue) exposed. There is no  tunneling or undermining noted.  There is a medium amount of serosanguineous drainage noted. The wound margin is distinct with the outline attached to the wound base. There is small (1-33%) red, friable granulation within the wound bed. There is a large (67-100%) amount of necrotic tissue within the wound bed including Adherent Slough. The periwound skin appearance had no abnormalities noted for moisture. The periwound skin appearance exhibited: Excoriation, Erythema. The periwound skin appearance did not exhibit: Atrophie Blanche, Ecchymosis, Rubor. The surrounding wound skin color is noted with erythema. Periwound temperature was noted as No Abnormality. Assessment Active Problems ICD-10 Non-pressure chronic ulcer of right calf with fat layer exposed Morbid (severe) obesity due to excess calories Essential (primary) hypertension Localized edema Procedures AUDRENE, METZEL ([id number]) [phone].pdf Page 8 of 11 Wound #2 Pre-procedure diagnosis of Wound #2 is a Venous Leg Ulcer located on the Right,Posterior Lower Leg .Severity of Tissue Pre Debridement is: Fat layer exposed. There was a Excisional Skin/Subcutaneous Tissue Debridement with a total area of 9.3 sq cm performed by Fredirick Maudlin, MD. With the following instrument(s): Curette to remove Non-Viable tissue/material. Material removed includes Subcutaneous Tissue and Slough and after achieving pain control using Lidocaine 5% topical ointment. No specimens were taken. A time out was conducted at 08:00, prior to the start of the procedure. A Minimum amount of bleeding was controlled with Pressure. The procedure was tolerated well. Post Debridement Measurements: 3cm length x 3.1cm width x 0.4cm depth; 2.922cm^3 volume. Character of Wound/Ulcer Post Debridement is improved. Severity of Tissue Post Debridement is: Fat layer exposed. Post procedure Diagnosis Wound #2: Same as Pre-Procedure General Notes: Scribed  for Dr. Celine [name] by Adline Peals, RN. Pre-procedure diagnosis of Wound #2 is a Venous Leg Ulcer located on the Right,Posterior Lower Leg . There was a Three Layer Compression Therapy Procedure by Adline Peals, RN. Post procedure Diagnosis Wound #2: Same as Pre-Procedure Plan Follow-up Appointments: Return Appointment in 1 week. - Dr. Celine [name] - room 2 Anesthetic: (In clinic) Topical Lidocaine 5% applied to wound bed Bathing/ Shower/ Hygiene: May shower with protection but do not get wound dressing(s) wet. Edema Control - Lymphedema / SCD / Other: Elevate legs to the level of the heart or above for 30 minutes daily and/or when sitting, a frequency of: - throughout the day Avoid standing for long periods of time. Exercise regularly Moisturize legs daily. Compression stocking or Garment 20-30 mm/Hg pressure to: - left leg daily. Apply first thing in the morning, remove at night. The following medication(s) was prescribed: lidocaine topical 5 % ointment ointment topical was prescribed at facility WOUND #2: - Lower Leg Wound Laterality: Right, Posterior Cleanser: Soap and Water 1 x Per Week/30 Days Discharge Instructions: May shower and wash wound with dial antibacterial soap and water prior to dressing change. Cleanser: Wound Cleanser 1 x Per Week/30 Days Discharge Instructions: Cleanse the wound with wound cleanser prior to applying a clean dressing using gauze sponges, not tissue or cotton balls. Peri-Wound Care: Triamcinolone 15 (g) 1 x Per Week/30 Days Discharge Instructions: Use triamcinolone 15 (g) as directed Peri-Wound Care: Sween Lotion (Moisturizing lotion) 1 x Per Week/30 Days Discharge Instructions: Apply moisturizing lotion as directed Prim Dressing: Sorbalgon AG Dressing 2x2 (in/in) 1 x Per Week/30 Days ary Discharge Instructions: Apply to wound bed as instructed Secondary Dressing: Woven Gauze Sponge, Non-Sterile 4x4 in 1 x Per Week/30 Days Discharge Instructions:  Apply over primary dressing as directed. Secondary Dressing: Zetuvit Plus 4x8 in 1 x Per Week/30 Days Discharge Instructions: Apply over primary dressing as  directed. Com pression Wrap: ThreePress (3 layer compression wrap) 1 x Per Week/30 Days Discharge Instructions: Apply three layer compression as directed. Com pression Wrap: Unnaboot w/Calamine, 4x10 (in/yd) 1 x Per Week/30 Days Discharge Instructions: Apply Unnaboot at top of leg 03/14/2022: The wound is about the same, to perhaps a little bit larger. There is a fair amount of slough accumulation on the surface, as well as some hypertrophic granulation tissue. He has a curette to debride slough and nonviable subcutaneous tissue from her wound. I then chemically cauterized the hypertrophic granulation tissue with silver nitrate. We will use her Keystone topical antimicrobial compound and packed the wound with silver alginate. Continue 3 layer compression with Unna boot first layer at the top to prevent slippage. She will follow-up in 1 week. Electronic Signature(s) Signed: 03/14/2022 2:59:42 PM By: Fredirick Maudlin MD FACS Signed: 03/14/2022 3:45:47 PM By: Adline Peals Previous Signature: 03/14/2022 8:15:49 AM Version By: Fredirick Maudlin MD FACS Entered By: Adline Peals on 03/14/2022 14:44:52 -------------------------------------------------------------------------------- HxROS Details Patient Name: Date of Service: [name], [place]. 03/14/2022 7:45 A M Medical Record Number: [id number] Patient Account Number: [id number] CLARITA, SEACAT ([id number]) [id number].pdf Page 9 of 11 Date of Birth/Sex: Treating RN: 11-14-1960 (61 y.o. F) Primary Care Provider: Other Clinician: Cecille Po, BA RBA RA Referring Provider: Treating Provider/Extender: Bettey Mare EL, BA RBA RA Weeks in Treatment: 7 Information Obtained From Patient Eyes Medical History: Negative for: Cataracts; Glaucoma; Optic  Neuritis Ear/Nose/Mouth/Throat Medical History: Negative for: Chronic sinus problems/congestion; Middle ear problems Hematologic/Lymphatic Medical History: Positive for: Anemia - iron Negative for: Hemophilia; Human Immunodeficiency Virus; Lymphedema; Sickle Cell Disease Respiratory Medical History: Positive for: Sleep Apnea - CPAP Negative for: Aspiration; Asthma; Chronic Obstructive Pulmonary Disease (COPD); Pneumothorax; Tuberculosis Cardiovascular Medical History: Positive for: Hypertension; Peripheral Venous Disease Negative for: Angina; Arrhythmia; Congestive Heart Failure; Coronary Artery Disease; Deep Vein Thrombosis; Hypotension; Myocardial Infarction; Peripheral Arterial Disease; Phlebitis; Vasculitis Gastrointestinal Medical History: Negative for: Cirrhosis ; Colitis; Crohns; Hepatitis A; Hepatitis B; Hepatitis C Endocrine Medical History: Negative for: Type I Diabetes; Type II Diabetes Genitourinary Medical History: Negative for: End Stage Renal Disease Past Medical History Notes: years ago kidney stones Immunological Medical History: Negative for: Lupus Erythematosus; Raynauds; Scleroderma Integumentary (Skin) Medical History: Negative for: History of Burn Musculoskeletal Medical History: Negative for: Gout; Rheumatoid Arthritis; Osteoarthritis; Osteomyelitis Neurologic Medical History: Negative for: Dementia; Neuropathy; Quadriplegia; Paraplegia; Seizure Disorder Oncologic Medical History: Negative for: Received Chemotherapy; Received Radiation Past Medical History NotesMarland Kitchen JALEIGH, PROVAN ([id number]) [phone].pdf Page 10 of 11 skin Ca removed from back years ago Psychiatric Medical History: Negative for: Anorexia/bulimia; Confinement Anxiety Immunizations Pneumococcal Vaccine: Received Pneumococcal Vaccination: No Implantable Devices None Hospitalization / Surgery History Type of Hospitalization/Surgery cholecystectomy  1980s nephrolithasis 1980s plate and rod in right elbow surgery 2010 Family and Social History Cancer: Yes - Mother; Diabetes: Yes - Father; Heart Disease: No; Hypertension: Yes - Mother; Kidney Disease: Yes - Mother; Lung Disease: Yes - Father; Seizures: No; Stroke: No; Thyroid Problems: Yes - Paternal Grandparents; Tuberculosis: No; Never smoker; Marital Status - Married; Alcohol Use: Never; Drug Use: No History; Caffeine Use: Daily - coffee; Financial Concerns: No; Food, Clothing or Shelter Needs: No; Support System Lacking: No; Transportation Concerns: No Electronic Signature(s) Signed: 03/14/2022 10:43:45 AM By: Fredirick Maudlin MD FACS Entered By: Fredirick Maudlin on 03/14/2022 08:11:55 -------------------------------------------------------------------------------- SuperBill Details Patient Name: Date of Service: [name], [place]. 03/14/2022 Medical Record Number: [id number] Patient Account Number: [id number] Date of Birth/Sex: Treating RN:  07-May-1960 (61 y.o. F) Primary Care Provider: Cecille Po, BA RBA RA Other Clinician: Referring Provider: Treating Provider/Extender: Bettey Mare EL, BA RBA RA Weeks in Treatment: 7 Diagnosis Coding ICD-10 Codes Code Description [id number] Non-pressure chronic ulcer of right calf with fat layer exposed E66.01 Morbid (severe) obesity due to excess calories I10 Essential (primary) hypertension R60.0 Localized edema Facility Procedures : CPT4 Code: [id number] Description: [id number] - DEB SUBQ TISSUE 20 SQ CM/< ICD-10 Diagnosis Description L97.212 Non-pressure chronic ulcer of right calf with fat layer exposed Modifier: Quantity: 1 Physician Procedures : CPT4 Code Description Modifier [id number] - WC PHYS LEVEL 4 - EST PT 25 ICD-10 Diagnosis Description L97.212 Non-pressure chronic ulcer of right calf with fat layer exposed R60.0 Localized edema E66.01 Morbid (severe) obesity due to excess calories  KAULA, HOGSED ([id number])  [id number] Essential (primary) hypertension Quantity: 1 7.pdf Page 11 of 11 : [id number] - WC PHYS SUBQ TISS 20 SQ CM 1 ICD-10 Diagnosis Description L97.212 Non-pressure chronic ulcer of right calf with fat layer exposed Quantity: Electronic Signature(s) Signed: 03/14/2022 8:16:06 AM By: Fredirick Maudlin MD FACS Entered By: Fredirick Maudlin on 03/14/2022 08:16:06

## 2022-03-21 ENCOUNTER — Encounter (HOSPITAL_BASED_OUTPATIENT_CLINIC_OR_DEPARTMENT_OTHER): Payer: BC Managed Care – PPO | Admitting: Internal Medicine

## 2022-03-21 DIAGNOSIS — L97212 Non-pressure chronic ulcer of right calf with fat layer exposed: Secondary | ICD-10-CM | POA: Diagnosis not present

## 2022-03-21 NOTE — Progress Notes (Signed)
RYNN, MARKIEWICZ ([id number]) [id number] Page 1 of 7 Visit Report for 03/21/2022 Arrival Information Details Patient Name: Date of Service: [place], [name]. 03/21/2022 2:30 PM Medical Record Number: [id number] Patient Account Number: [id number] Date of Birth/Sex: Treating RN: 09-29-1960 (61 y.o. F) Primary Care [name]: Gerilyn Pilgrim, BA RBA RA Other Clinician: Referring [name]: Treating [name]/Extender: [name] [name], BA RBA RA Weeks in Treatment: 8 Visit Information History Since Last Visit All ordered tests and consults were completed: No Patient Arrived: Ambulatory Added or deleted any medications: No Arrival Time: 14:31 Any new allergies or adverse reactions: No Accompanied By: self Had a fall or experienced change in No Transfer Assistance: None activities of daily living that may affect Patient Identification Verified: Yes risk of falls: Secondary Verification Process Completed: Yes Signs or symptoms of abuse/neglect since last visito No Patient Requires Transmission-Based Precautions: No Hospitalized since last visit: No Patient Has Alerts: No Implantable device outside of the clinic excluding No cellular tissue based products placed in the center since last visit: Pain Present Now: No Electronic Signature(s) Signed: 03/21/2022 3:40:58 PM By: Dayton Scrape Entered By: Dayton Scrape on 03/21/2022 14:31:53 -------------------------------------------------------------------------------- Compression Therapy Details Patient Name: Date of Service: [name], [name] 03/21/2022 2:30 PM Medical Record Number: [id number] Patient Account Number: [id number] Date of Birth/Sex: Treating RN: February 01, 1961 (62 y.o. Katrinka Blazing Primary Care [name]: Gerilyn Pilgrim, [facility] RBA RA Other Clinician: Referring [name]: Treating [name]/Extender: [name] [name], BA RBA RA Weeks in Treatment: 8 Compression Therapy Performed for Wound  Assessment: Wound #2 Right,Posterior Lower Leg Performed By: Clinician Karie Schwalbe, RN Compression Type: Three Layer Post Procedure Diagnosis Same as Pre-procedure Electronic Signature(s) Signed: 03/21/2022 5:08:57 PM By: Karie Schwalbe RN Entered By: Karie Schwalbe on 03/21/2022 15:47:59 [name] ([id number]) [id number] Page 2 of 7 -------------------------------------------------------------------------------- Encounter Discharge Information Details Patient Name: Date of Service: [place], [name] 03/21/2022 2:30 PM Medical Record Number: [id number] Patient Account Number: [id number] Date of Birth/Sex: Treating RN: [birth year] (61 y.o. Katrinka Blazing Primary Care [name]: Gerilyn Pilgrim, [facility] RBA RA Other Clinician: Referring [name]: Treating [name]/Extender: [name] [name], BA RBA RA Weeks in Treatment: 8 Encounter Discharge Information Items Discharge Condition: Stable Ambulatory Status: Ambulatory Discharge Destination: Home Transportation: Private Auto Accompanied By: self Schedule Follow-up Appointment: Yes Clinical Summary of Care: Patient Declined Electronic Signature(s) Signed: 03/21/2022 5:08:57 PM By: Karie Schwalbe RN Entered By: Karie Schwalbe on 03/21/2022 16:40:52 -------------------------------------------------------------------------------- Lower Extremity Assessment Details Patient Name: Date of Service: [name], [name] [name] 03/21/2022 2:30 PM Medical Record Number: [id number] Patient Account Number: [id number] Date of Birth/Sex: Treating RN: 1961/02/09 (61 y.o. Katrinka Blazing Primary Care [name]: Gerilyn Pilgrim, [facility] RBA RA Other Clinician: Referring [name]: Treating [name]/Extender: [name] [name], BA RBA RA Weeks in Treatment: 8 Edema Assessment Assessed: [Left: No] [Right: No] [Left: Edema] [Right: :] Calf Left: Right: Point of Measurement: From Medial Instep 47 cm Ankle Left:  Right: Point of Measurement: From Medial Instep 23.3 cm Vascular Assessment Pulses: Dorsalis Pedis Palpable: [Right:Yes] Electronic Signature(s) Signed: 03/21/2022 5:08:57 PM By: Karie Schwalbe RN Entered By: Karie Schwalbe on 03/21/2022 15:09:29 Multi Wound Chart Details -------------------------------------------------------------------------------- [name] ([id number]) [id number] Page 3 of 7 Patient Name: Date of Service: [place], [name] 03/21/2022 2:30 PM Medical Record Number: [id number] Patient Account Number: [id number] Date of Birth/Sex: Treating RN: 12/19/1960 (61 y.o. F) Primary Care [name]: Gerilyn Pilgrim, [facility] RBA RA Other Clinician: Referring [name]: Treating [name]/Extender:  [name] [name], BA RBA RA Weeks in Treatment: 8 Vital Signs Height(in): Pulse(bpm): 49 Weight(lbs): Blood Pressure(mmHg): 127/82 Body Mass Index(BMI): Temperature(F): 97.9 Respiratory Rate(breaths/min): 20 Wound Assessments Wound Number: 2 N/A N/A Photos: N/A N/A Right, Posterior Lower Leg N/A N/A Wound Location: Insect Bite N/A N/A Wounding Event: Venous Leg Ulcer N/A N/A Primary Etiology: Anemia, Sleep Apnea, Hypertension, N/A N/A Comorbid History: Peripheral Venous Disease 11/21/2021 N/A N/A Date Acquired: 8 N/A N/A Weeks of Treatment: Open N/A N/A Wound Status: No N/A N/A Wound Recurrence: 2.8x3x0.3 N/A N/A Measurements L x W x D (cm) 6.597 N/A N/A A (cm) : rea 1.979 N/A N/A Volume (cm) : -1899.10% N/A N/A % Reduction in Area: -1899.00% N/A N/A % Reduction in Volume: Full Thickness Without Exposed N/A N/A Classification: Support Structures Medium N/A N/A Exudate Amount: Serosanguineous N/A N/A Exudate Type: red, brown N/A N/A Exudate Color: Distinct, outline attached N/A N/A Wound Margin: Small (1-33%) N/A N/A Granulation Amount: Red, Friable N/A N/A Granulation Quality: Large (67-100%) N/A  N/A Necrotic Amount: Fat Layer (Subcutaneous Tissue): Yes N/A N/A Exposed Structures: Fascia: No Tendon: No Muscle: No Joint: No Bone: No None N/A N/A Epithelialization: Excoriation: Yes N/A N/A Periwound Skin Texture: No Abnormalities Noted N/A N/A Periwound Skin Moisture: Erythema: Yes N/A N/A Periwound Skin Color: Atrophie Blanche: No Ecchymosis: No Rubor: No No Abnormality N/A N/A Temperature: Compression Therapy N/A N/A Procedures Performed: Treatment Notes Electronic Signature(s) Signed: 03/21/2022 4:13:03 PM By: [name] Najjar MD Entered By: [name] on 03/21/2022 16:05:43 [name] ([id number]) [id number] Page 4 of 7 -------------------------------------------------------------------------------- Multi-Disciplinary Care Plan Details Patient Name: Date of Service: [place], [name]. 03/21/2022 2:30 PM Medical Record Number: [id number] Patient Account Number: [id number] Date of Birth/Sex: Treating RN: 12-21-1960 (61 y.o. Katrinka Blazing Primary Care Lieutenant Abarca: Gerilyn Pilgrim, [name] RBA RA Other Clinician: Referring Yaire Kreher: Treating Clementine Soulliere/Extender: [name] [name], BA RBA RA Weeks in Treatment: 8 Multidisciplinary Care Plan reviewed with physician Active Inactive Necrotic Tissue Nursing Diagnoses: Impaired tissue integrity related to necrotic/devitalized tissue Knowledge deficit related to management of necrotic/devitalized tissue Goals: Necrotic/devitalized tissue will be minimized in the wound bed Date Initiated: 01/23/2022 Target Resolution Date: 06/06/2022 Goal Status: Active Patient/caregiver will verbalize understanding of reason and process for debridement of necrotic tissue Date Initiated: 01/23/2022 Target Resolution Date: 06/06/2022 Goal Status: Active Interventions: Assess patient pain level pre-, during and post procedure and prior to discharge Provide education on necrotic tissue and debridement  process Treatment Activities: Apply topical anesthetic as ordered : 01/23/2022 Notes: Wound/Skin Impairment Nursing Diagnoses: Impaired tissue integrity Knowledge deficit related to ulceration/compromised skin integrity Goals: Patient/caregiver will verbalize understanding of skin care regimen Date Initiated: 01/23/2022 Target Resolution Date: 06/06/2022 Goal Status: Active Interventions: Assess patient/caregiver ability to obtain necessary supplies Assess ulceration(s) every visit Treatment Activities: Skin care regimen initiated : 01/23/2022 Topical wound management initiated : 01/23/2022 Notes: Electronic Signature(s) Signed: 03/21/2022 5:08:57 PM By: Karie Schwalbe RN Entered By: Karie Schwalbe on 03/21/2022 16:38:04 -------------------------------------------------------------------------------- Pain Assessment Details Patient Name: Date of Service: [name] Madura, [name] 03/21/2022 2:30 PM Medical Record Number: [id number] Patient Account Number: [id number] Date of Birth/Sex: Treating RN: October 06, 1960 (61 y.o. F) Primary Care Richa Shor: Gerilyn Pilgrim, BA RBA RA Other Clinician: ANGELINE, TRICK ([id number]) [id number] Page 5 of 7 Referring Jillayne Witte: Treating Caylie Sandquist/Extender: [name] [name], BA RBA RA Weeks in Treatment: 8 Active Problems Location of Pain Severity and Description of Pain Patient Has Paino No Site Locations Pain Management and Medication  Current Pain Management: Electronic Signature(s) Signed: 03/21/2022 3:40:58 PM By: Dayton Scrapeoss, Aisha Entered By: Dayton Scrapeoss, Aisha on 03/21/2022 14:32:51 -------------------------------------------------------------------------------- Patient/Caregiver Education Details Patient Name: Date of Service: [name], [name] 12/14/2023andnbsp2:30 PM Medical Record Number: [id number] Patient Account Number: [id number] Date of Birth/Gender: Treating RN: 05/19/1960 (61 y.o. Katrinka BlazingF) [name], [name] Primary  Care Physician: Gerilyn PilgrimMICHA [name], [facility] R[facility] RA Other Clinician: Referring Physician: Treating Physician/Extender: Verdie Shireobson, Michael [name], BA RBA RA Weeks in Treatment: 8 Education Assessment Education Provided To: Patient Education Topics Provided Wound/Skin Impairment: Methods: Explain/Verbal Responses: Return demonstration correctly Electronic Signature(s) Signed: 03/21/2022 5:08:57 PM By: Karie SchwalbeScotton, Joanne RN Entered By: Karie SchwalbeScotton, [name] on 03/21/2022 16:38:38 [name] CorkWATKINS, [name] ([id number]) [id number] Page 6 of 7 -------------------------------------------------------------------------------- Wound Assessment Details Patient Name: Date of Service: [name], [name] [name] 03/21/2022 2:30 PM Medical Record Number: [id number] Patient Account Number: [id number] Date of Birth/Sex: Treating RN: 07/23/1960 (61 y.o. F) Primary Care Linell Shawn: Gerilyn PilgrimMICHA [name], BA RBA RA Other Clinician: Referring Culley Hedeen: Treating Bertram Haddix/Extender: [name] Generaobson, Michael [name], BA RBA RA Weeks in Treatment: 8 Wound Status Wound Number: 2 Primary Venous Leg Ulcer Etiology: Wound Location: Right, Posterior Lower Leg Wound Status: Open Wounding Event: Insect Bite Comorbid Anemia, Sleep Apnea, Hypertension, Peripheral Venous Date Acquired: 11/21/2021 History: Disease Weeks Of Treatment: 8 Clustered Wound: No Photos Wound Measurements Length: (cm) 2 Width: (cm) 3 Depth: (cm) 0 Area: (cm) Volume: (cm) .8 % Reduction in Area: -1899.1% % Reduction in Volume: -1899% .3 Epithelialization: None 6.597 Tunneling: No 1.979 Undermining: No Wound Description Classification: Full Thickness Without Exposed Suppor Wound Margin: Distinct, outline attached Exudate Amount: Medium Exudate Type: Serosanguineous Exudate Color: red, brown t Structures Foul Odor After Cleansing: No Slough/Fibrino Yes Wound Bed Granulation Amount: Small (1-33%) Exposed Structure Granulation Quality: Red, Friable Fascia  Exposed: No Necrotic Amount: Large (67-100%) Fat Layer (Subcutaneous Tissue) Exposed: Yes Necrotic Quality: Adherent Slough Tendon Exposed: No Muscle Exposed: No Joint Exposed: No Bone Exposed: No Periwound Skin Texture Texture Color No Abnormalities Noted: No No Abnormalities Noted: No Excoriation: Yes Atrophie Blanche: No Ecchymosis: No Moisture Erythema: Yes No Abnormalities Noted: Yes Rubor: No Temperature / Pain Temperature: No Abnormality Treatment Notes Wound #2 (Lower Leg) Wound Laterality: Right, Posterior [name] CorkWATKINS, [name] ([id number]) [id number] Page 7 of 7 Cleanser Soap and Water Discharge Instruction: May shower and wash wound with dial antibacterial soap and water prior to dressing change. Wound Cleanser Discharge Instruction: Cleanse the wound with wound cleanser prior to applying a clean dressing using gauze sponges, not tissue or cotton balls. Peri-Wound Care Triamcinolone 15 (g) Discharge Instruction: Use triamcinolone 15 (g) as directed Sween Lotion (Moisturizing lotion) Discharge Instruction: Apply moisturizing lotion as directed Topical Primary Dressing Sorbalgon AG Dressing 2x2 (in/in) Discharge Instruction: Apply to wound bed as instructed Secondary Dressing Woven Gauze Sponge, Non-Sterile 4x4 in Discharge Instruction: Apply over primary dressing as directed. Zetuvit Plus 4x8 in Discharge Instruction: Apply over primary dressing as directed. Secured With Compression Wrap ThreePress (3 layer compression wrap) Discharge Instruction: Apply three layer compression as directed. Unnaboot w/Calamine, 4x10 (in/yd) Discharge Instruction: Apply Unnaboot at top of leg Compression Stockings Add-Ons Electronic Signature(s) Signed: 03/21/2022 5:08:57 PM By: Karie SchwalbeScotton, Joanne RN Entered By: Karie SchwalbeScotton, [name] on 03/21/2022 15:11:05 -------------------------------------------------------------------------------- Vitals  Details Patient Name: Date of Service: [name], [name] [name] 03/21/2022 2:30 PM Medical Record Number: [id number] Patient Account Number: [id number] Date of Birth/Sex: Treating RN: 05/15/1960 (61 y.o. F) Primary Care Freddie Dymek: Gerilyn PilgrimMICHA [name], [facility] Other Clinician: Referring Pascha Fogal: Treating Laverne Hursey/Extender: [name] Najjarobson, Michael  [name], BA RBA RA Weeks in Treatment: 8 Vital Signs Time Taken: 02:30 Temperature (F): 97.9 Pulse (bpm): 49 Respiratory Rate (breaths/min): 20 Blood Pressure (mmHg): 127/82 Reference Range: 80 - 120 mg / dl Electronic Signature(s) Signed: 03/21/2022 3:40:58 PM By: Dayton Scrape Entered By: Dayton Scrape on 03/21/2022 14:32:36

## 2022-03-22 NOTE — Progress Notes (Signed)
ANACLARA, ACKLIN ([id number]) [id number] Page 1 of 7 Visit Report for 03/21/2022 HPI Details Patient Name: Date of Service: [name], [name] [name] 03/21/2022 2:30 PM Medical Record Number: [id number] Patient Account Number: [id number] Date of Birth/Sex: Treating RN: 20-Jan-1961 (61 y.o. F) Primary Care Provider: Gerilyn Pilgrim, [facility] RBA RA Other Clinician: Referring Provider: Treating Provider/Extender: Jesus Genera EL, BA RBA RA Weeks in Treatment: 8 History of Present Illness HPI Description: 02/16/2020 upon evaluation today patient actually appears to be doing poorly in regard to her left medial lower extremity ulcer. This is actually an area that she tells me she has had intermittent issues with over the years although has been closed for some time she typically uses compression right now she has juxta lite compression wraps. With that being said she tells me that this nonetheless open several weeks/months ago and has been given her trouble since. She does have a history of chronic venous insufficiency she is seeing specialist for this in the past she has had an ablation as well as sclerotherapy. With that being said she also has hypertension chronically which is managed by her primary care provider. In general she seems to be worsening overall with regard to the wound and states that she finally realized that she needed to come in and have somebody look at this and not continue to try to manage this on her own. No fevers, chills, nausea, vomiting, or diarrhea. 02/23/2020 on evaluation today patient appears to be doing well with regard to her wound. This is showing some signs of improvement which is great news still were not quite at the point where I would like to be as far as the overall appearance of the wound is concerned but I do believe this is better than last week. I do believe the Iodoflex is helping as well. 03/08/2020 upon evaluation today patient  appears to be doing well with regard to her wound. She has been tolerating the dressing changes without complication. Fortunately I feel like she has made great progress with the Iodoflex but I feel like it may be the point rest to switch to something else possibly a collagen- based dressing at this time. 03/15/2020 upon evaluation today patient appears to be doing excellent in regard to her leg ulcer. She has been tolerating the dressing changes without complication. Fortunately there is no signs of active infection. Overall she is measuring a little bit smaller today which is great news. 03/22/2020 upon evaluation today patient appears to be doing well with regard to her wound. She has been tolerating the dressing changes without complication. Fortunately there is no signs of active infection at this time. 03/28/2020; patient I do not usually see however she has a wound on the left anterior lower leg secondary to chronic venous insufficiency we have been using silver collagen under compression. She arrives in clinic with a nonviable surface requiring debridement 04/12/2020 upon evaluation today patient appears to be doing well all things considered with regard to her leg ulcer. She is tolerating the dressing changes without complication there is minimal dry skin around the edges of the wound that may be trapping and stopping some of the events of the new skin I am can work on that today. Otherwise the surface of the wound appears to be doing excellent. 04/19/2020 upon evaluation today patient appears to be doing well with regard to her leg ulcer. She has been tolerating dressing changes without complication. Fortunately there is no signs of active infection  at this time. No fever chills noted overall very pleased with how things seem to be progressing. 04/26/2020 on evaluation today patient appears to be doing well with regard to her wound currently. Is showing signs of excellent improvement overall is  filling in nicely and there does not appear to be any signs of infection. No fevers, chills, nausea, vomiting, or diarrhea. 05/03/2020 upon evaluation today patient appears to be doing well with regard to her wound on the leg. This overall showing signs of good improvement which is great she has some good epithelial growth and overall I think that things are moving in the correct direction. We likewise going to continue with the wound care measures as before since she seems to making such good improvement. 2/3; venous wound on the left medial leg. This is contracting. We are using Prisma and 3 layer compression. She has a stocking and waiting in the eventuality this heals. She is already using it on the right 05/17/2020 upon evaluation today patient appears to be doing well with regard to her leg ulcer. She has been tolerating the dressing changes without complication. Fortunately there is no signs of active infection which is great news and overall very pleased with where things stand today. No fevers, chills, nausea, vomiting, or diarrhea. 05/24/2020 upon evaluation today patient appears to be doing well with regard to her wound. Overall I feel like she is making excellent progress. There does not appear to be any signs of active infection which is great news. 05/31/2020 upon evaluation today patient appears to be doing well with regard to her wound. There does not appear to be any signs of active infection which is great news overall I am extremely pleased with where things stand today. READMISSION 01/23/2022 She returns to clinic today with a new wound on her right posterior calf. She says that she was cleaning out an old shed near the middle of August this year and then noticed what seemed to be a bug bite on her right posterior calf. It was itchy, red, and raised. By the end of September, an ulcer had developed. She has been applying various topical creams such as hydrocortisone and others to the  site. When it was not improving, she made an appointment in the wound care center. ABI in clinic today was 0.97. On her right posterior calf, there is a circular wound with necrotic fat and black eschar present. There is no purulent drainage or malodor. The periwound skin is in good condition with just a little induration that appears to be secondary to inflammation. 01/31/2022: The wound measures a little bit larger today, but overall is quite a bit cleaner. There is some undermining from 9:00 to 3:00. She is having some periwound itching and says that her wrap slid. 02/08/2022: Despite using an Unna boot first layer at the top of her wrap, it slid again and it looks like there is been some bruising at the wound site. The wound is about the same size in terms of dimensions. There is a fair amount of slough and nonviable tissue still present. Edema control is better than last week. 02/15/2022: The wound dimensions are about the same. There is less nonviable tissue present. The periwound erythema has improved. BANI, GIANFRANCESCO ([id number]) [id number] Page 2 of 7 02/22/2022: The wound has deteriorated over the past week. It is larger and the periwound is more edematous, erythematous, and indurated. It looks as though there has been more tissue breakdown with undermining present. She  is having more pain. There is a foul odor coming from the wound. 02/26/2022: The wound looks quite a bit better today and the odor is gone. I still do not have her culture data back, but she seems to be responding well to the Augmentin. 03/06/2022: Her wound continues to improve. There is still some slough accumulation, but the periwound skin is less inflamed. Her culture returned with a polymicrobial population including Pseudomonas and so levofloxacin was also prescribed. She did not understand why a second antibiotic was being added so she has not yet initiated this. 03/14/2022: The wound is  about the same, to perhaps a little bit larger. There is a fair amount of slough accumulation on the surface, as well as some hypertrophic granulation tissue. She is still taking levofloxacin, but has completed taking Augmentin. She has her Jodie Echevaria compound with her today. 12/14; the patient has a significant circular wound on the posterior right calf. She is using Keystone and silver alginate under 3 layer compression. Her ABIs were within normal limits at 0.97. This may have been traumatic or an insect bite at the start I reviewed these records. Electronic Signature(s) Signed: 03/21/2022 4:13:03 PM By: Baltazar Najjar MD Entered By: Baltazar Najjar on 03/21/2022 16:06:49 -------------------------------------------------------------------------------- Physical Exam Details Patient Name: Date of Service: [name], [name] [name] 03/21/2022 2:30 PM Medical Record Number: [id number] Patient Account Number: [id number] Date of Birth/Sex: Treating RN: Oct 13, 1960 (61 y.o. F) Primary Care Provider: Gerilyn Pilgrim, [facility] RBA RA Other Clinician: Referring Provider: Treating Provider/Extender: Jesus Genera EL, BA RBA RA Weeks in Treatment: 8 Constitutional Sitting or standing Blood Pressure is within target range for patient.. Pulse regular and within target range for patient.Marland Kitchen Respirations regular, non-labored and within target range.. Temperature is normal and within the target range for the patient.Marland Kitchen Appears in no distress. Notes Moderately sized punched out area on the right posterior calf almost completely circular. She still has some slough on the surface of this I washed the wound out vigorously with wound cleanser and gauze no mechanical debridement at this point. There is no surrounding tenderness no erythema no crepitus Electronic Signature(s) Signed: 03/21/2022 4:13:03 PM By: Baltazar Najjar MD Entered By: Baltazar Najjar on 03/21/2022  16:07:46 -------------------------------------------------------------------------------- Physician Orders Details Patient Name: Date of Service: [address], [name] [name] 03/21/2022 2:30 PM Medical Record Number: [id number] Patient Account Number: [id number] Date of Birth/Sex: Treating RN: Aug 18, 1960 (61 y.o. Katrinka Blazing Primary Care Provider: Gerilyn Pilgrim, [facility] RBA RA Other Clinician: Referring Provider: Treating Provider/Extender: Jesus Genera EL, BA RBA RA Weeks in Treatment: 8 Verbal / Phone Orders: No Diagnosis Coding Follow-up Appointments ppointment in 1 week. - Dr. Lady Gary - Room 2 Return A Anesthetic (In clinic) Topical Lidocaine 5% applied to wound bed [name], [name] ([id number]) [phone].pdf Page 3 of 7 Bathing/ Shower/ Hygiene May shower with protection but do not get wound dressing(s) wet. Edema Control - Lymphedema / SCD / Other Bilateral Lower Extremities Elevate legs to the level of the heart or above for 30 minutes daily and/or when sitting, a frequency of: - throughout the day Avoid standing for long periods of time. Exercise regularly Moisturize legs daily. Compression stocking or Garment 20-30 mm/Hg pressure to: - left leg daily. Apply first thing in the morning, remove at night. Wound Treatment Wound #2 - Lower Leg Wound Laterality: Right, Posterior Cleanser: Soap and Water 1 x Per Week/30 Days Discharge Instructions: May shower and wash wound with dial antibacterial soap and water prior to  dressing change. Cleanser: Wound Cleanser 1 x Per Week/30 Days Discharge Instructions: Cleanse the wound with wound cleanser prior to applying a clean dressing using gauze sponges, not tissue or cotton balls. Peri-Wound Care: Triamcinolone 15 (g) 1 x Per Week/30 Days Discharge Instructions: Use triamcinolone 15 (g) as directed Peri-Wound Care: Sween Lotion (Moisturizing lotion) 1 x Per Week/30 Days Discharge Instructions: Apply  moisturizing lotion as directed Prim Dressing: Sorbalgon AG Dressing 2x2 (in/in) 1 x Per Week/30 Days ary Discharge Instructions: Apply to wound bed as instructed Secondary Dressing: Woven Gauze Sponge, Non-Sterile 4x4 in 1 x Per Week/30 Days Discharge Instructions: Apply over primary dressing as directed. Secondary Dressing: Zetuvit Plus 4x8 in 1 x Per Week/30 Days Discharge Instructions: Apply over primary dressing as directed. Compression Wrap: ThreePress (3 layer compression wrap) 1 x Per Week/30 Days Discharge Instructions: Apply three layer compression as directed. Compression Wrap: Unnaboot w/Calamine, 4x10 (in/yd) 1 x Per Week/30 Days Discharge Instructions: Apply Unnaboot at top of leg Electronic Signature(s) Signed: 03/21/2022 4:13:03 PM By: Baltazar Najjar MD Signed: 03/21/2022 5:08:57 PM By: Karie Schwalbe RN Entered By: Karie Schwalbe on 03/21/2022 15:48:46 -------------------------------------------------------------------------------- Problem List Details Patient Name: Date of Service: [place], [name] [name] 03/21/2022 2:30 PM Medical Record Number: [id number] Patient Account Number: [id number] Date of Birth/Sex: Treating RN: 02-07-1961 (61 y.o. F) Primary Care Provider: Gerilyn Pilgrim, [facility] RBA RA Other Clinician: Referring Provider: Treating Provider/Extender: Jesus Genera EL, BA RBA RA Weeks in Treatment: 8 Active Problems ICD-10 Encounter Code Description Active Date MDM Diagnosis L97.212 Non-pressure chronic ulcer of right calf with fat layer exposed 01/23/2022 No Yes E66.01 Morbid (severe) obesity due to excess calories 01/23/2022 No Yes NOYA, SANTARELLI ([id number]) [phone].pdf Page 4 of 7 I10 Essential (primary) hypertension 01/23/2022 No Yes R60.0 Localized edema 01/23/2022 No Yes Inactive Problems Resolved Problems Electronic Signature(s) Signed: 03/21/2022 4:13:03 PM By: Baltazar Najjar MD Entered By: Baltazar Najjar on  03/21/2022 16:05:37 -------------------------------------------------------------------------------- Progress Note Details Patient Name: Date of Service: [name], [name] [name] 03/21/2022 2:30 PM Medical Record Number: [id number] Patient Account Number: [id number] Date of Birth/Sex: Treating RN: 1960-06-25 (61 y.o. F) Primary Care Provider: Gerilyn Pilgrim, [facility] RBA RA Other Clinician: Referring Provider: Treating Provider/Extender: Jesus Genera EL, BA RBA RA Weeks in Treatment: 8 Subjective History of Present Illness (HPI) 02/16/2020 upon evaluation today patient actually appears to be doing poorly in regard to her left medial lower extremity ulcer. This is actually an area that she tells me she has had intermittent issues with over the years although has been closed for some time she typically uses compression right now she has juxta lite compression wraps. With that being said she tells me that this nonetheless open several weeks/months ago and has been given her trouble since. She does have a history of chronic venous insufficiency she is seeing specialist for this in the past she has had an ablation as well as sclerotherapy. With that being said she also has hypertension chronically which is managed by her primary care provider. In general she seems to be worsening overall with regard to the wound and states that she finally realized that she needed to come in and have somebody look at this and not continue to try to manage this on her own. No fevers, chills, nausea, vomiting, or diarrhea. 02/23/2020 on evaluation today patient appears to be doing well with regard to her wound. This is showing some signs of improvement which is great news still were not quite at the  point where I would like to be as far as the overall appearance of the wound is concerned but I do believe this is better than last week. I do believe the Iodoflex is helping as well. 03/08/2020 upon evaluation today patient  appears to be doing well with regard to her wound. She has been tolerating the dressing changes without complication. Fortunately I feel like she has made great progress with the Iodoflex but I feel like it may be the point rest to switch to something else possibly a collagen- based dressing at this time. 03/15/2020 upon evaluation today patient appears to be doing excellent in regard to her leg ulcer. She has been tolerating the dressing changes without complication. Fortunately there is no signs of active infection. Overall she is measuring a little bit smaller today which is great news. 03/22/2020 upon evaluation today patient appears to be doing well with regard to her wound. She has been tolerating the dressing changes without complication. Fortunately there is no signs of active infection at this time. 03/28/2020; patient I do not usually see however she has a wound on the left anterior lower leg secondary to chronic venous insufficiency we have been using silver collagen under compression. She arrives in clinic with a nonviable surface requiring debridement 04/12/2020 upon evaluation today patient appears to be doing well all things considered with regard to her leg ulcer. She is tolerating the dressing changes without complication there is minimal dry skin around the edges of the wound that may be trapping and stopping some of the events of the new skin I am can work on that today. Otherwise the surface of the wound appears to be doing excellent. 04/19/2020 upon evaluation today patient appears to be doing well with regard to her leg ulcer. She has been tolerating dressing changes without complication. Fortunately there is no signs of active infection at this time. No fever chills noted overall very pleased with how things seem to be progressing. 04/26/2020 on evaluation today patient appears to be doing well with regard to her wound currently. Is showing signs of excellent improvement overall is  filling in nicely and there does not appear to be any signs of infection. No fevers, chills, nausea, vomiting, or diarrhea. 05/03/2020 upon evaluation today patient appears to be doing well with regard to her wound on the leg. This overall showing signs of good improvement which is great she has some good epithelial growth and overall I think that things are moving in the correct direction. We likewise going to continue with the wound care measures as before since she seems to making such good improvement. 2/3; venous wound on the left medial leg. This is contracting. We are using Prisma and 3 layer compression. She has a stocking and waiting in the eventuality this heals. She is already using it on the right 05/17/2020 upon evaluation today patient appears to be doing well with regard to her leg ulcer. She has been tolerating the dressing changes without complication. Fortunately there is no signs of active infection which is great news and overall very pleased with where things stand today. No fevers, chills, nausea, vomiting, or diarrhea. [name], [name] ([id number]) [id number] Page 5 of 7 05/24/2020 upon evaluation today patient appears to be doing well with regard to her wound. Overall I feel like she is making excellent progress. There does not appear to be any signs of active infection which is great news. 05/31/2020 upon evaluation today patient appears to be doing well with  regard to her wound. There does not appear to be any signs of active infection which is great news overall I am extremely pleased with where things stand today. READMISSION 01/23/2022 She returns to clinic today with a new wound on her right posterior calf. She says that she was cleaning out an old shed near the middle of August this year and then noticed what seemed to be a bug bite on her right posterior calf. It was itchy, red, and raised. By the end of September, an ulcer had developed. She  has been applying various topical creams such as hydrocortisone and others to the site. When it was not improving, she made an appointment in the wound care center. ABI in clinic today was 0.97. On her right posterior calf, there is a circular wound with necrotic fat and black eschar present. There is no purulent drainage or malodor. The periwound skin is in good condition with just a little induration that appears to be secondary to inflammation. 01/31/2022: The wound measures a little bit larger today, but overall is quite a bit cleaner. There is some undermining from 9:00 to 3:00. She is having some periwound itching and says that her wrap slid. 02/08/2022: Despite using an Unna boot first layer at the top of her wrap, it slid again and it looks like there is been some bruising at the wound site. The wound is about the same size in terms of dimensions. There is a fair amount of slough and nonviable tissue still present. Edema control is better than last week. 02/15/2022: The wound dimensions are about the same. There is less nonviable tissue present. The periwound erythema has improved. 02/22/2022: The wound has deteriorated over the past week. It is larger and the periwound is more edematous, erythematous, and indurated. It looks as though there has been more tissue breakdown with undermining present. She is having more pain. There is a foul odor coming from the wound. 02/26/2022: The wound looks quite a bit better today and the odor is gone. I still do not have her culture data back, but she seems to be responding well to the Augmentin. 03/06/2022: Her wound continues to improve. There is still some slough accumulation, but the periwound skin is less inflamed. Her culture returned with a polymicrobial population including Pseudomonas and so levofloxacin was also prescribed. She did not understand why a second antibiotic was being added so she has not yet initiated this. 03/14/2022: The wound is  about the same, to perhaps a little bit larger. There is a fair amount of slough accumulation on the surface, as well as some hypertrophic granulation tissue. She is still taking levofloxacin, but has completed taking Augmentin. She has her Jodie Echevaria compound with her today. 12/14; the patient has a significant circular wound on the posterior right calf. She is using Keystone and silver alginate under 3 layer compression. Her ABIs were within normal limits at 0.97. This may have been traumatic or an insect bite at the start I reviewed these records. Objective Constitutional Sitting or standing Blood Pressure is within target range for patient.. Pulse regular and within target range for patient.Marland Kitchen Respirations regular, non-labored and within target range.. Temperature is normal and within the target range for the patient.Marland Kitchen Appears in no distress. Vitals Time Taken: 2:30 AM, Temperature: 97.9 F, Pulse: 49 bpm, Respiratory Rate: 20 breaths/min, Blood Pressure: 127/82 mmHg. General Notes: Moderately sized punched out area on the right posterior calf almost completely circular. She still has some slough on  the surface of this I washed the wound out vigorously with wound cleanser and gauze no mechanical debridement at this point. There is no surrounding tenderness no erythema no crepitus Integumentary (Hair, Skin) Wound #2 status is Open. Original cause of wound was Insect Bite. The date acquired was: 11/21/2021. The wound has been in treatment 8 weeks. The wound is located on the Right,Posterior Lower Leg. The wound measures 2.8cm length x 3cm width x 0.3cm depth; 6.597cm^2 area and 1.979cm^3 volume. There is Fat Layer (Subcutaneous Tissue) exposed. There is no tunneling or undermining noted. There is a medium amount of serosanguineous drainage noted. The wound margin is distinct with the outline attached to the wound base. There is small (1-33%) red, friable granulation within the wound bed. There is a  large (67-100%) amount of necrotic tissue within the wound bed including Adherent Slough. The periwound skin appearance had no abnormalities noted for moisture. The periwound skin appearance exhibited: Excoriation, Erythema. The periwound skin appearance did not exhibit: Atrophie Blanche, Ecchymosis, Rubor. The surrounding wound skin color is noted with erythema. Periwound temperature was noted as No Abnormality. Assessment Active Problems ICD-10 Non-pressure chronic ulcer of right calf with fat layer exposed Morbid (severe) obesity due to excess calories Essential (primary) hypertension Localized edema Procedures [name], [name] ([id number]) [phone].pdf Page 6 of 7 Wound #2 Pre-procedure diagnosis of Wound #2 is a Venous Leg Ulcer located on the Right,Posterior Lower Leg . There was a Three Layer Compression Therapy Procedure by Karie Schwalbe, RN. Post procedure Diagnosis Wound #2: Same as Pre-Procedure Plan Follow-up Appointments: Return Appointment in 1 week. - Dr. Lady Gary - Room 2 Anesthetic: (In clinic) Topical Lidocaine 5% applied to wound bed Bathing/ Shower/ Hygiene: May shower with protection but do not get wound dressing(s) wet. Edema Control - Lymphedema / SCD / Other: Elevate legs to the level of the heart or above for 30 minutes daily and/or when sitting, a frequency of: - throughout the day Avoid standing for long periods of time. Exercise regularly Moisturize legs daily. Compression stocking or Garment 20-30 mm/Hg pressure to: - left leg daily. Apply first thing in the morning, remove at night. WOUND #2: - Lower Leg Wound Laterality: Right, Posterior Cleanser: Soap and Water 1 x Per Week/30 Days Discharge Instructions: May shower and wash wound with dial antibacterial soap and water prior to dressing change. Cleanser: Wound Cleanser 1 x Per Week/30 Days Discharge Instructions: Cleanse the wound with wound cleanser prior to applying a  clean dressing using gauze sponges, not tissue or cotton balls. Peri-Wound Care: Triamcinolone 15 (g) 1 x Per Week/30 Days Discharge Instructions: Use triamcinolone 15 (g) as directed Peri-Wound Care: Sween Lotion (Moisturizing lotion) 1 x Per Week/30 Days Discharge Instructions: Apply moisturizing lotion as directed Prim Dressing: Sorbalgon AG Dressing 2x2 (in/in) 1 x Per Week/30 Days ary Discharge Instructions: Apply to wound bed as instructed Secondary Dressing: Woven Gauze Sponge, Non-Sterile 4x4 in 1 x Per Week/30 Days Discharge Instructions: Apply over primary dressing as directed. Secondary Dressing: Zetuvit Plus 4x8 in 1 x Per Week/30 Days Discharge Instructions: Apply over primary dressing as directed. Com pression Wrap: ThreePress (3 layer compression wrap) 1 x Per Week/30 Days Discharge Instructions: Apply three layer compression as directed. Com pression Wrap: Unnaboot w/Calamine, 4x10 (in/yd) 1 x Per Week/30 Days Discharge Instructions: Apply Unnaboot at top of leg 1. I did not alter the current dressing which is Keystone, silver alginate under 3 layer compression she seems to be tolerating this well. Keeping her legs  elevated etc. 2. Although there was some surface slough. I did not attempt a mechanical debridement although that may be necessary Electronic Signature(s) Signed: 03/21/2022 4:13:03 PM By: Baltazar Najjar MD Entered By: Baltazar Najjar on 03/21/2022 16:08:25 -------------------------------------------------------------------------------- SuperBill Details Patient Name: Date of Service: [name], [name] [name] 03/21/2022 Medical Record Number: [id number] Patient Account Number: [id number] Date of Birth/Sex: Treating RN: 1960-08-13 (61 y.o. 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.01 Morbid (severe) obesity due to excess calories I10 Essential (primary) hypertension R60.0 Localized edema [name], [name] ([id number]) [phone].pdf Page 7 of 7 Facility Procedures : CPT4 Code: [id number] Description: (Facility Use Only) [phone] - APPLY MULTLAY COMPRS LWR RT LEG Modifier: Quantity: 1 Physician Procedures : CPT4 Code Description Modifier [id number] - WC PHYS LEVEL 3 - EST PT ICD-10 Diagnosis Description L97.212 Non-pressure chronic ulcer of right calf with fat layer exposed Quantity: 1 Electronic Signature(s) Signed: 03/21/2022 5:08:57 PM By: Karie Schwalbe RN Signed: 03/22/2022 2:46:37 PM By: Baltazar Najjar MD Previous Signature: 03/21/2022 4:13:03 PM Version By: Baltazar Najjar MD Entered By: Karie Schwalbe on 03/21/2022 16:39:34

## 2022-03-28 ENCOUNTER — Encounter (HOSPITAL_BASED_OUTPATIENT_CLINIC_OR_DEPARTMENT_OTHER): Payer: BC Managed Care – PPO | Admitting: General Surgery

## 2022-03-28 DIAGNOSIS — L97212 Non-pressure chronic ulcer of right calf with fat layer exposed: Secondary | ICD-10-CM | POA: Diagnosis not present

## 2022-03-28 NOTE — Progress Notes (Signed)
[name], [name] ([id number]) [id number] Page 1 of 4 Visit Report for 03/28/2022 Arrival Information Details Patient Name: Date of Service: [name], [name] 03/28/2022 3:15 PM Medical Record Number: [id number] Patient Account Number: [id number] Date of Birth/Sex: Treating RN: [birth year] (61 y.o. Katrinka Blazing Primary Care Ilario Dhaliwal: Gerilyn Pilgrim, BA RBA RA Other Clinician: Referring Kimberli Winne: Treating Alyssa [name]/Extender: Renelda Mom EL, BA RBA RA Weeks in Treatment: 9 Visit Information History Since Last Visit Added or deleted any medications: No Patient Arrived: Ambulatory Any new allergies or adverse reactions: No Arrival Time: 15:39 Had a fall or experienced change in No Accompanied By: self activities of daily living that may affect Transfer Assistance: None risk of falls: Patient Requires Transmission-Based Precautions: No Signs or symptoms of abuse/neglect since last visito No Patient Has Alerts: No Hospitalized since last visit: No Implantable device outside of the clinic excluding No cellular tissue based products placed in the center since last visit: Has Dressing in Place as Prescribed: Yes Pain Present Now: No Electronic Signature(s) Signed: 03/28/2022 4:27:05 PM By: Karie Schwalbe RN Entered By: Karie Schwalbe on 03/28/2022 15:54:51 -------------------------------------------------------------------------------- Compression Therapy Details Patient Name: Date of Service: [name], [name] [name] H. 03/28/2022 3:15 PM Medical Record Number: [id number] Patient Account Number: [id number] Date of Birth/Sex: Treating RN: Mar 04, 1961 (61 y.o. Katrinka Blazing Primary Care Naseem Varden: Gerilyn Pilgrim, [facility] RBA RA Other Clinician: Referring Kamiyah Kindel: Treating Allard Lightsey/Extender: Renelda Mom EL, BA RBA RA Weeks in Treatment: 9 Compression Therapy Performed for Wound Assessment: Wound #2 Right,Posterior Lower Leg Performed By: Clinician  Karie Schwalbe, RN Compression Type: Three Layer Electronic Signature(s) Signed: 03/28/2022 4:27:05 PM By: Karie Schwalbe RN Entered By: Karie Schwalbe on 03/28/2022 16:25:36 -------------------------------------------------------------------------------- Encounter Discharge Information Details Patient Name: Date of Service: [address], [name] [name] H. 03/28/2022 3:15 PM Medical Record Number: [id number] Patient Account Number: [id number] Date of Birth/Sex: Treating RN: Aug 28, 1960 (61 y.o. [address] [place], [place] H ([id number]) [id number] Page 2 of 4 Primary Care Ashyra Cantin: Gerilyn Pilgrim, [facility] RBA RA Other Clinician: Referring Charlene Detter: Treating Keiondra Brookover/Extender: Renelda Mom EL, BA RBA RA Weeks in Treatment: 9 Encounter Discharge Information Items Discharge Condition: Stable Ambulatory Status: Ambulatory Discharge Destination: Home Transportation: Private Auto Accompanied By: self Schedule Follow-up Appointment: Yes Clinical Summary of Care: Patient Declined Electronic Signature(s) Signed: 03/28/2022 4:27:05 PM By: Karie Schwalbe RN Entered By: Karie Schwalbe on 03/28/2022 16:26:21 -------------------------------------------------------------------------------- Patient/Caregiver Education Details Patient Name: Date of Service: [name], [name] [name] Rexene Edison 12/21/2023andnbsp3:15 PM Medical Record Number: [id number] Patient Account Number: [id number] Date of Birth/Gender: Treating RN: Nov 30, 1960 (61 y.o. Katrinka Blazing Primary Care Physician: Gerilyn Pilgrim, [facility] RBA RA Other Clinician: Referring Physician: Treating Physician/Extender: Renelda Mom EL, BA RBA RA Weeks in Treatment: 9 Education Assessment Education Provided To: Patient Education Topics Provided Wound/Skin Impairment: Methods: Explain/Verbal Responses: Return demonstration correctly Electronic Signature(s) Signed: 03/28/2022 4:27:05 PM By: Karie Schwalbe RN Entered By:  Karie Schwalbe on 03/28/2022 16:26:03 -------------------------------------------------------------------------------- Wound Assessment Details Patient Name: Date of Service: [name], [name] [name] H. 03/28/2022 3:15 PM Medical Record Number: [id number] Patient Account Number: [id number] Date of Birth/Sex: Treating RN: June 23, 1960 (61 y.o. Katrinka Blazing Primary Care Gail Vendetti: Gerilyn Pilgrim, [facility] RBA RA Other Clinician: Referring Jovonte Commins: Treating Arlenis Blaydes/Extender: Renelda Mom EL, BA RBA RA Weeks in Treatment: 9 Wound Status Wound Number: 2 Primary Venous Leg Ulcer Etiology: Wound Location: Right, Posterior Lower Leg Wound Status: Open Wounding Event: Insect Bite Comorbid Anemia, Sleep Apnea, Hypertension, Peripheral Venous Date Acquired: 11/21/2021 History: Disease  Weeks Of Treatment: [name] [place] H ([id number]) [id number] Page 3 of 4 Clustered Wound: No Wound Measurements Length: (cm) 2.8 Width: (cm) 3 Depth: (cm) 0.3 Area: (cm) 6.597 Volume: (cm) 1.979 % Reduction in Area: -1899.1% % Reduction in Volume: -1899% Epithelialization: None Tunneling: No Undermining: No Wound Description Classification: Full Thickness Without Exposed Suppor Wound Margin: Distinct, outline attached Exudate Amount: Medium Exudate Type: Serosanguineous Exudate Color: red, brown t Structures Foul Odor After Cleansing: No Slough/Fibrino Yes Wound Bed Granulation Amount: Small (1-33%) Exposed Structure Granulation Quality: Red, Friable Fascia Exposed: No Necrotic Amount: Large (67-100%) Fat Layer (Subcutaneous Tissue) Exposed: Yes Necrotic Quality: Adherent Slough Tendon Exposed: No Muscle Exposed: No Joint Exposed: No Bone Exposed: No Periwound Skin Texture Texture Color No Abnormalities Noted: No No Abnormalities Noted: No Excoriation: No Atrophie Blanche: No Scarring: Yes Ecchymosis: No Erythema: Yes Moisture Hemosiderin Staining: Yes No  Abnormalities Noted: Yes Rubor: No Temperature / Pain Temperature: No Abnormality Treatment Notes Wound #2 (Lower Leg) Wound Laterality: Right, Posterior Cleanser Soap and Water Discharge Instruction: May shower and wash wound with dial antibacterial soap and water prior to dressing change. Wound Cleanser Discharge Instruction: Cleanse the wound with wound cleanser prior to applying a clean dressing using gauze sponges, not tissue or cotton balls. Peri-Wound Care Triamcinolone 15 (g) Discharge Instruction: Use triamcinolone 15 (g) as directed Sween Lotion (Moisturizing lotion) Discharge Instruction: Apply moisturizing lotion as directed Topical Primary Dressing Sorbalgon AG Dressing 2x2 (in/in) Discharge Instruction: Apply to wound bed as instructed Secondary Dressing Woven Gauze Sponge, Non-Sterile 4x4 in Discharge Instruction: Apply over primary dressing as directed. Zetuvit Plus 4x8 in Discharge Instruction: Apply over primary dressing as directed. Secured With Compression Wrap ThreePress (3 layer compression wrap) Discharge Instruction: Apply three layer compression as directed. Unnaboot w/Calamine, 4x10 (in/yd) Discharge Instruction: Apply Unnaboot at top of leg [name], [name] ([id number]) [phone].pdf Page 4 of 4 Compression Stockings Add-Ons Electronic Signature(s) Signed: 03/28/2022 4:27:05 PM By: Karie Schwalbe RN Entered By: Karie Schwalbe on 03/28/2022 15:55:53 -------------------------------------------------------------------------------- Vitals Details Patient Name: Date of Service: [name], [name] [name] H. 03/28/2022 3:15 PM Medical Record Number: [id number] Patient Account Number: [id number] Date of Birth/Sex: Treating RN: 01/24/1961 (61 y.o. Katrinka Blazing Primary Care Kaneesha Constantino: Gerilyn Pilgrim, BA RBA RA Other Clinician: Referring Cande Mastropietro: Treating Breton Berns/Extender: Renelda Mom EL, BA RBA RA Weeks in Treatment:  9 Vital Signs Time Taken: 15:55 Temperature (F): 98.1 Pulse (bpm): 92 Respiratory Rate (breaths/min): 18 Blood Pressure (mmHg): 121/77 Reference Range: 80 - 120 mg / dl Electronic Signature(s) Signed: 03/28/2022 4:27:05 PM By: Karie Schwalbe RN Entered By: Karie Schwalbe on 03/28/2022 15:55:16

## 2022-04-02 NOTE — Progress Notes (Signed)
EMILIE, CARP ([id number]) [id number] Page 1 of 1 Visit Report for 03/28/2022 SuperBill Details Patient Name: Date of Service: [name] 03/28/2022 Medical Record Number: [id number] Patient Account Number: [id number] Date of Birth/Sex: Treating RN: 1960/05/27 (61 y.o. Katrinka Blazing Primary Care Provider: Gerilyn Pilgrim, [facility] RBA RA Other Clinician: Referring Provider: Treating Provider/Extender: Renelda Mom EL, BA RBA RA Weeks in Treatment: 9 Diagnosis Coding ICD-10 Codes Code Description [phone] Non-pressure chronic ulcer of right calf with fat layer exposed E66.01 Morbid (severe) obesity due to excess calories I10 Essential (primary) hypertension R60.0 Localized edema Facility Procedures CPT4 Code Description Modifier Quantity [phone] (Facility Use Only) [phone] - APPLY MULTLAY COMPRS LWR RT LEG 1 Electronic Signature(s) Signed: 03/28/2022 5:15:54 PM By: Karie Schwalbe RN Signed: 04/02/2022 7:34:47 AM By: [name] MD FACS Entered By: Karie Schwalbe on 03/28/2022 17:15:03

## 2022-04-04 ENCOUNTER — Encounter (HOSPITAL_BASED_OUTPATIENT_CLINIC_OR_DEPARTMENT_OTHER): Payer: BC Managed Care – PPO | Admitting: General Surgery

## 2022-04-04 DIAGNOSIS — L97212 Non-pressure chronic ulcer of right calf with fat layer exposed: Secondary | ICD-10-CM | POA: Diagnosis not present

## 2022-04-05 NOTE — Progress Notes (Signed)
[name], [name] ([id number]) [id number] Page 1 of 8 Visit Report for 04/04/2022 Arrival Information Details Patient Name: Date of Service: [name], [name]. 04/04/2022 8:15 A M Medical Record Number: [id number] Patient Account Number: [id number] Date of Birth/Sex: Treating RN: [birth year] (61 y.o. F) Primary Care [name]: [name], BA RBA RA Other Clinician: Referring Canaan Prue: Treating Tonja Jezewski/Extender: Renelda Mom EL, BA RBA RA Weeks in Treatment: 10 Visit Information History Since Last Visit All ordered tests and consults were completed: No Patient Arrived: Ambulatory Added or deleted any medications: No Arrival Time: 08:27 Any new allergies or adverse reactions: No Accompanied By: self Had a fall or experienced change in No Transfer Assistance: None activities of daily living that may affect Patient Identification Verified: Yes risk of falls: Secondary Verification Process Completed: Yes Signs or symptoms of abuse/neglect since last visito No Patient Requires Transmission-Based Precautions: No Hospitalized since last visit: No Patient Has Alerts: No Implantable device outside of the clinic excluding No cellular tissue based products placed in the center since last visit: Pain Present Now: No Electronic Signature(s) Signed: 04/04/2022 11:05:54 AM By: Dayton Scrape Entered By: Dayton Scrape on 04/04/2022 08:28:15 -------------------------------------------------------------------------------- Compression Therapy Details Patient Name: Date of Service: [name], [name] 04/04/2022 8:15 A M Medical Record Number: [id number] Patient Account Number: [id number] Date of Birth/Sex: Treating RN: [birth year] (61 y.o. [name], [name] Primary Care Michel Eskelson: [name], [facility] RBA RA Other Clinician: Referring Rajvir Ernster: Treating Jessicia Napolitano/Extender: Renelda Mom EL, BA RBA RA Weeks in Treatment: 10 Compression Therapy Performed for Wound  Assessment: Wound #2 Right,Posterior Lower Leg Performed By: Clinician Tommie Ard, RN Compression Type: Three Layer Post Procedure Diagnosis Same as Pre-procedure Electronic Signature(s) Signed: 04/05/2022 12:15:24 PM By: Tommie Ard RN Entered By: Tommie Ard on 04/04/2022 08:49:30 [name] ([id number]) [id number] Page 2 of 8 -------------------------------------------------------------------------------- Encounter Discharge Information Details Patient Name: Date of Service: [name] 04/04/2022 8:15 A M Medical Record Number: [id number] Patient Account Number: [id number] Date of Birth/Sex: Treating RN: 1960-09-23 (61 y.o. F) Zochol, [name] Primary Care Maanya Hippert: [name], [facility] RBA RA Other Clinician: Referring Gaynor Genco: Treating Mancil Pfenning/Extender: Renelda Mom EL, BA RBA RA Weeks in Treatment: 10 Encounter Discharge Information Items Post Procedure Vitals Discharge Condition: Stable Temperature (F): 98.3 Ambulatory Status: Ambulatory Pulse (bpm): 103 Discharge Destination: Home Respiratory Rate (breaths/min): 20 Transportation: Private Auto Blood Pressure (mmHg): 135/83 Accompanied By: self Schedule Follow-up Appointment: Yes Clinical Summary of Care: Electronic Signature(s) Signed: 04/05/2022 12:15:24 PM By: Tommie Ard RN Entered By: Tommie Ard on 04/04/2022 08:51:38 -------------------------------------------------------------------------------- Lower Extremity Assessment Details Patient Name: Date of Service: [name], [name] 04/04/2022 8:15 A M Medical Record Number: [id number] Patient Account Number: [id number] Date of Birth/Sex: Treating RN: Sep 06, 1960 (61 y.o. F) Zochol, [name] Primary Care Prapti Grussing: [name], [facility] RBA RA Other Clinician: Referring Quanita Barona: Treating Shaune Malacara/Extender: Renelda Mom EL, BA RBA RA Weeks in Treatment: 10 Edema Assessment Assessed: [Left: No] [Right:  No] [Left: Edema] [Right: :] Calf Left: Right: Point of Measurement: From Medial Instep 45 cm Ankle Left: Right: Point of Measurement: From Medial Instep 24 cm Vascular Assessment Pulses: Dorsalis Pedis Palpable: [Right:Yes] Electronic Signature(s) Signed: 04/05/2022 12:15:24 PM By: Tommie Ard RN Entered By: Tommie Ard on 04/04/2022 08:36:55 Multi Wound Chart Details -------------------------------------------------------------------------------- [name] ([id number]) [id number] Page 3 of 8 Patient Name: Date of Service: [place], [name] 04/04/2022 8:15 A M Medical Record Number: [id number] Patient Account Number: [id number] Date of Birth/Sex:  Treating RN: Apr 07, 1961 (61 y.o. F) Zochol, [name] Primary Care Kimmi Acocella: [name], BA RBA RA Other Clinician: Referring Julia Kulzer: Treating Ernisha Sorn/Extender: Renelda Mom EL, BA RBA RA Weeks in Treatment: 10 Vital Signs Height(in): Pulse(bpm): 103 Weight(lbs): Blood Pressure(mmHg): 135/83 Body Mass Index(BMI): Temperature(F): 98.1 Respiratory Rate(breaths/min): 20 Wound Assessments Wound Number: 2 N/A N/A Photos: N/A N/A Right, Posterior Lower Leg N/A N/A Wound Location: Insect Bite N/A N/A Wounding Event: Venous Leg Ulcer N/A N/A Primary Etiology: Anemia, Sleep Apnea, Hypertension, N/A N/A Comorbid History: Peripheral Venous Disease 11/21/2021 N/A N/A Date Acquired: 10 N/A N/A Weeks of Treatment: Open N/A N/A Wound Status: No N/A N/A Wound Recurrence: 2x2.6x0.2 N/A N/A Measurements L x W x D (cm) 4.084 N/A N/A A (cm) : rea 0.817 N/A N/A Volume (cm) : -1137.60% N/A N/A % Reduction in A rea: -725.30% N/A N/A % Reduction in Volume: Full Thickness Without Exposed N/A N/A Classification: Support Structures Medium N/A N/A Exudate A mount: Serosanguineous N/A N/A Exudate Type: red, brown N/A N/A Exudate Color: Distinct, outline attached N/A N/A Wound  Margin: Small (1-33%) N/A N/A Granulation A mount: Red, Friable N/A N/A Granulation Quality: Large (67-100%) N/A N/A Necrotic A mount: Fat Layer (Subcutaneous Tissue): Yes N/A N/A Exposed Structures: Fascia: No Tendon: No Muscle: No Joint: No Bone: No None N/A N/A Epithelialization: Debridement - Excisional N/A N/A Debridement: Pre-procedure Verification/Time Out 08:47 N/A N/A Taken: Lidocaine 4% Topical Solution N/A N/A Pain Control: Subcutaneous, Slough N/A N/A Tissue Debrided: Skin/Subcutaneous Tissue N/A N/A Level: 5.2 N/A N/A Debridement A (sq cm): rea Curette N/A N/A Instrument: Minimum N/A N/A Bleeding: Pressure N/A N/A Hemostasis A chieved: Procedure was tolerated well N/A N/A Debridement Treatment Response: 2x2.6x0.2 N/A N/A Post Debridement Measurements L x W x D (cm) 0.817 N/A N/A Post Debridement Volume: (cm) Scarring: Yes N/A N/A Periwound Skin Texture: Excoriation: No No Abnormalities Noted N/A N/A Periwound Skin Moisture: Erythema: Yes N/A N/A Periwound Skin Color: Hemosiderin Staining: Yes Atrophie Blanche: No Ecchymosis: No Rubor: No No Abnormality N/A N/A Temperature: Compression Therapy N/A N/A Procedures Performed: Debridement [name], [name] ([id number]) [id number] Page 4 of 8 Treatment Notes Electronic Signature(s) Signed: 04/04/2022 8:50:51 AM By: [name] MD FACS Signed: 04/05/2022 12:15:24 PM By: Tommie Ard RN Entered By: [name] on 04/04/2022 08:50:51 -------------------------------------------------------------------------------- Multi-Disciplinary Care Plan Details Patient Name: Date of Service: [place], [name] 04/04/2022 8:15 A M Medical Record Number: [id number] Patient Account Number: [id number] Date of Birth/Sex: Treating RN: 03-19-1961 (61 y.o. F) Zochol, [name] Primary Care Mackenzye Mackel: [name], [facility] RBA RA Other Clinician: Referring Aalina Brege: Treating  Aster Eckrich/Extender: Renelda Mom EL, BA RBA RA Weeks in Treatment: 10 Multidisciplinary Care Plan reviewed with physician Active Inactive Necrotic Tissue Nursing Diagnoses: Impaired tissue integrity related to necrotic/devitalized tissue Knowledge deficit related to management of necrotic/devitalized tissue Goals: Necrotic/devitalized tissue will be minimized in the wound bed Date Initiated: 01/23/2022 Target Resolution Date: 06/06/2022 Goal Status: Active Patient/caregiver will verbalize understanding of reason and process for debridement of necrotic tissue Date Initiated: 01/23/2022 Target Resolution Date: 06/06/2022 Goal Status: Active Interventions: Assess patient pain level pre-, during and post procedure and prior to discharge Provide education on necrotic tissue and debridement process Treatment Activities: Apply topical anesthetic as ordered : 01/23/2022 Notes: Wound/Skin Impairment Nursing Diagnoses: Impaired tissue integrity Knowledge deficit related to ulceration/compromised skin integrity Goals: Patient/caregiver will verbalize understanding of skin care regimen Date Initiated: 01/23/2022 Target Resolution Date: 06/06/2022 Goal Status: Active Interventions: Assess patient/caregiver ability to obtain necessary supplies  Assess ulceration(s) every visit Treatment Activities: Skin care regimen initiated : 01/23/2022 Topical wound management initiated : 01/23/2022 Notes: Electronic Signature(s) Signed: 04/05/2022 12:15:24 PM By: Tommie Ard RN Heying,Signed: 04/05/2022 12:15:24 PM By: Tommie Ard RN Romie Minus ([id number]) [id number] Page 5 of 8 Entered By: Tommie Ard on 04/04/2022 08:42:05 -------------------------------------------------------------------------------- Pain Assessment Details Patient Name: Date of Service: [place], [name] 04/04/2022 8:15 A M Medical Record Number: [id number] Patient Account Number:  [id number] Date of Birth/Sex: Treating RN: [birth year] (61 y.o. F) Primary Care Tyneshia Stivers: [name], [facility] RBA RA Other Clinician: Referring Muadh Creasy: Treating Niti Leisure/Extender: Renelda Mom EL, BA RBA RA Weeks in Treatment: 10 Active Problems Location of Pain Severity and Description of Pain Patient Has Paino No Site Locations Pain Management and Medication Current Pain Management: Electronic Signature(s) Signed: 04/04/2022 11:05:54 AM By: Dayton Scrape Entered By: Dayton Scrape on 04/04/2022 08:29:25 -------------------------------------------------------------------------------- Patient/Caregiver Education Details Patient Name: Date of Service: [name], [name] 12/28/2023andnbsp8:15 A M Medical Record Number: [id number] Patient Account Number: [id number] Date of Birth/Gender: Treating RN: 04-02-1961 (61 y.o. [name], [name] Primary Care Physician: [name], [facility] RBA RA Other Clinician: Referring Physician: Treating Physician/Extender: Renelda Mom EL, BA RBA RA Weeks in Treatment: 10 Education Assessment Education Provided To: Patient Education Topics Provided [name], [name] ([id number]) [id number] Page 6 of 8 Wound Debridement: Methods: Explain/Verbal Responses: Reinforcements needed, State content correctly Wound/Skin Impairment: Methods: Explain/Verbal Responses: Reinforcements needed, State content correctly Electronic Signature(s) Signed: 04/05/2022 12:15:24 PM By: Tommie Ard RN Entered By: Tommie Ard on 04/04/2022 08:42:21 -------------------------------------------------------------------------------- Wound Assessment Details Patient Name: Date of Service: [name], [name] 04/04/2022 8:15 A M Medical Record Number: [id number] Patient Account Number: [id number] Date of Birth/Sex: Treating RN: [birth year] (61 y.o. F) Primary Care Yahshua Thibault: [name], BA RBA RA Other Clinician: Referring Quade Ramirez: Treating  Daesean Lazarz/Extender: Renelda Mom EL, BA RBA RA Weeks in Treatment: 10 Wound Status Wound Number: 2 Primary Venous Leg Ulcer Etiology: Wound Location: Right, Posterior Lower Leg Wound Status: Open Wounding Event: Insect Bite Comorbid Anemia, Sleep Apnea, Hypertension, Peripheral Venous Date Acquired: 11/21/2021 History: Disease Weeks Of Treatment: 10 Clustered Wound: No Photos Wound Measurements Length: (cm) 2 Width: (cm) 2.6 Depth: (cm) 0.2 Area: (cm) 4.084 Volume: (cm) 0.817 % Reduction in Area: -1137.6% % Reduction in Volume: -725.3% Epithelialization: None Wound Description Classification: Full Thickness Without Exposed Suppor Wound Margin: Distinct, outline attached Exudate Amount: Medium Exudate Type: Serosanguineous Exudate Color: red, brown t Structures Foul Odor After Cleansing: No Slough/Fibrino Yes Wound Bed Granulation Amount: Small (1-33%) Exposed Structure Granulation Quality: Red, Friable Fascia Exposed: No Necrotic Amount: Large (67-100%) Fat Layer (Subcutaneous Tissue) Exposed: Yes Necrotic Quality: Adherent Slough Tendon Exposed: No Muscle Exposed: No Joint Exposed: No [name], [name] ([id number]) [id number] Page 7 of 8 Bone Exposed: No Periwound Skin Texture Texture Color No Abnormalities Noted: No No Abnormalities Noted: No Excoriation: No Atrophie Blanche: No Scarring: Yes Ecchymosis: No Erythema: Yes Moisture Hemosiderin Staining: Yes No Abnormalities Noted: Yes Rubor: No Temperature / Pain Temperature: No Abnormality Treatment Notes Wound #2 (Lower Leg) Wound Laterality: Right, Posterior Cleanser Soap and Water Discharge Instruction: May shower and wash wound with dial antibacterial soap and water prior to dressing change. Wound Cleanser Discharge Instruction: Cleanse the wound with wound cleanser prior to applying a clean dressing using gauze sponges, not tissue or cotton balls. Peri-Wound  Care Triamcinolone 15 (g) Discharge Instruction: Use triamcinolone 15 (g) as directed Sween Lotion (Moisturizing lotion) Discharge Instruction: Apply moisturizing  lotion as directed Topical Primary Dressing Sorbalgon AG Dressing 2x2 (in/in) Discharge Instruction: Apply to wound bed as instructed Secondary Dressing Woven Gauze Sponge, Non-Sterile 4x4 in Discharge Instruction: Apply over primary dressing as directed. Zetuvit Plus 4x8 in Discharge Instruction: Apply over primary dressing as directed. Secured With Compression Wrap ThreePress (3 layer compression wrap) Discharge Instruction: Apply three layer compression as directed. Unnaboot w/Calamine, 4x10 (in/yd) Discharge Instruction: Apply Unnaboot at top of leg Compression Stockings Add-Ons Electronic Signature(s) Signed: 04/05/2022 12:15:24 PM By: Tommie ArdZochol, [name] RN Entered By: Tommie ArdZochol, [name] on 04/04/2022 08:40:12 -------------------------------------------------------------------------------- Vitals Details Patient Name: Date of Service: [name], [name] 04/04/2022 8:15 A M Medical Record Number: [id number] Patient Account Number: [id number] Date of Birth/Sex: Treating RN: 08/10/1960 (61 y.o. F) Primary Care Teshia Mahone: [name] PilgrimMICHA EL, [facility] Other Clinician: Referring Aireona Torelli: Treating Lakya Schrupp/Extender: Renelda Momannon, Jennifer MICHA EL, BA RBA RA Weeks in Treatment: [address] [place], Denzil H ([id number]) [id number] Page 8 of 8 Vital Signs Time Taken: 08:25 Temperature (F): 98.1 Pulse (bpm): 103 Respiratory Rate (breaths/min): 20 Blood Pressure (mmHg): 135/83 Reference Range: 80 - 120 mg / dl Electronic Signature(s) Signed: 04/04/2022 11:05:54 AM By: Dayton Scrapeoss, Aisha Entered By: Dayton Scrapeoss, Aisha on 04/04/2022 08:29:11

## 2022-04-05 NOTE — Progress Notes (Signed)
[name], [name] ([id number]) [id number] Page 1 of 10 Visit Report for 04/04/2022 Chief Complaint Document Details Patient Name: Date of Service: [place], [name] 04/04/2022 8:15 A M Medical Record Number: [id number] Patient Account Number: [id number] Date of Birth/Sex: Treating RN: 01/24/1961 (61 y.o. F) Primary Care Provider: Gerilyn Pilgrim, [facility] RBA RA Other Clinician: Referring Provider: Treating Provider/Extender: Renelda Mom EL, BA RBA RA Weeks in Treatment: 10 Information Obtained from: Patient Chief Complaint RLE ulcer Electronic Signature(s) Signed: 04/04/2022 8:50:58 AM By: [name] MD FACS Entered By: [name] on 04/04/2022 08:50:58 -------------------------------------------------------------------------------- Debridement Details Patient Name: Date of Service: [name], [name] [name] 04/04/2022 8:15 A M Medical Record Number: [id number] Patient Account Number: [id number] Date of Birth/Sex: Treating RN: 02-12-1961 (61 y.o. F) Zochol, [name] Primary Care Provider: Gerilyn Pilgrim, BA RBA RA Other Clinician: Referring Provider: Treating Provider/Extender: Renelda Mom EL, BA RBA RA Weeks in Treatment: 10 Debridement Performed for Assessment: Wound #2 Right,Posterior Lower Leg Performed By: Physician [name], MD Debridement Type: Debridement Severity of Tissue Pre Debridement: Fat layer exposed Level of Consciousness (Pre-procedure): Awake and Alert Pre-procedure Verification/Time Out Yes - 08:47 Taken: Start Time: 08:48 Pain Control: Lidocaine 4% T opical Solution T Area Debrided (L x W): otal 2 (cm) x 2.6 (cm) = 5.2 (cm) Tissue and other material debrided: Viable, Non-Viable, Slough, Subcutaneous, Slough Level: Skin/Subcutaneous Tissue Debridement Description: Excisional Instrument: Curette Bleeding: Minimum Hemostasis Achieved: Pressure Response to Treatment: Procedure was tolerated well Level of  Consciousness (Post- Awake and Alert procedure): Post Debridement Measurements of Total Wound Length: (cm) 2 Width: (cm) 2.6 Depth: (cm) 0.2 Volume: (cm) 0.817 Character of Wound/Ulcer Post Debridement: Improved Severity of Tissue Post Debridement: Fat layer exposed Post Procedure Diagnosis Same as [name], [name] ([id number]) [id number] Page 2 of 10 Notes Scribed for Dr. Lady Gary by Tommie Ard, RN Electronic Signature(s) Signed: 04/04/2022 10:27:26 AM By: [name] MD FACS Signed: 04/05/2022 12:15:24 PM By: Tommie Ard RN Entered By: Tommie Ard on 04/04/2022 08:49:14 -------------------------------------------------------------------------------- HPI Details Patient Name: Date of Service: [name], [name] 04/04/2022 8:15 A M Medical Record Number: [id number] Patient Account Number: [id number] Date of Birth/Sex: Treating RN: 03-Jun-1960 (61 y.o. F) Primary Care Provider: Gerilyn Pilgrim, [facility] RBA RA Other Clinician: Referring Provider: Treating Provider/Extender: Renelda Mom EL, BA RBA RA Weeks in Treatment: 10 History of Present Illness HPI Description: 02/16/2020 upon evaluation today patient actually appears to be doing poorly in regard to her left medial lower extremity ulcer. This is actually an area that she tells me she has had intermittent issues with over the years although has been closed for some time she typically uses compression right now she has juxta lite compression wraps. With that being said she tells me that this nonetheless open several weeks/months ago and has been given her trouble since. She does have a history of chronic venous insufficiency she is seeing specialist for this in the past she has had an ablation as well as sclerotherapy. With that being said she also has hypertension chronically which is managed by her primary care provider. In general she seems to be worsening overall with regard  to the wound and states that she finally realized that she needed to come in and have somebody look at this and not continue to try to manage this on her own. No fevers, chills, nausea, vomiting, or diarrhea. 02/23/2020 on evaluation today patient appears to be doing well with regard to her wound. This is  showing some signs of improvement which is great news still were not quite at the point where I would like to be as far as the overall appearance of the wound is concerned but I do believe this is better than last week. I do believe the Iodoflex is helping as well. 03/08/2020 upon evaluation today patient appears to be doing well with regard to her wound. She has been tolerating the dressing changes without complication. Fortunately I feel like she has made great progress with the Iodoflex but I feel like it may be the point rest to switch to something else possibly a collagen- based dressing at this time. 03/15/2020 upon evaluation today patient appears to be doing excellent in regard to her leg ulcer. She has been tolerating the dressing changes without complication. Fortunately there is no signs of active infection. Overall she is measuring a little bit smaller today which is great news. 03/22/2020 upon evaluation today patient appears to be doing well with regard to her wound. She has been tolerating the dressing changes without complication. Fortunately there is no signs of active infection at this time. 03/28/2020; patient I do not usually see however she has a wound on the left anterior lower leg secondary to chronic venous insufficiency we have been using silver collagen under compression. She arrives in clinic with a nonviable surface requiring debridement 04/12/2020 upon evaluation today patient appears to be doing well all things considered with regard to her leg ulcer. She is tolerating the dressing changes without complication there is minimal dry skin around the edges of the wound that may  be trapping and stopping some of the events of the new skin I am can work on that today. Otherwise the surface of the wound appears to be doing excellent. 04/19/2020 upon evaluation today patient appears to be doing well with regard to her leg ulcer. She has been tolerating dressing changes without complication. Fortunately there is no signs of active infection at this time. No fever chills noted overall very pleased with how things seem to be progressing. 04/26/2020 on evaluation today patient appears to be doing well with regard to her wound currently. Is showing signs of excellent improvement overall is filling in nicely and there does not appear to be any signs of infection. No fevers, chills, nausea, vomiting, or diarrhea. 05/03/2020 upon evaluation today patient appears to be doing well with regard to her wound on the leg. This overall showing signs of good improvement which is great she has some good epithelial growth and overall I think that things are moving in the correct direction. We likewise going to continue with the wound care measures as before since she seems to making such good improvement. 2/3; venous wound on the left medial leg. This is contracting. We are using Prisma and 3 layer compression. She has a stocking and waiting in the eventuality this heals. She is already using it on the right 05/17/2020 upon evaluation today patient appears to be doing well with regard to her leg ulcer. She has been tolerating the dressing changes without complication. Fortunately there is no signs of active infection which is great news and overall very pleased with where things stand today. No fevers, chills, nausea, vomiting, or diarrhea. 05/24/2020 upon evaluation today patient appears to be doing well with regard to her wound. Overall I feel like she is making excellent progress. There does not appear to be any signs of active infection which is great news. 05/31/2020 upon evaluation today patient  appears to be doing well with regard to her wound. There does not appear to be any signs of active infection which is great news overall I am extremely pleased with where things stand today. READMISSION 01/23/2022 She returns to clinic today with a new wound on her right posterior calf. She says that she was cleaning out an old shed near the middle of August this year and then noticed what seemed to be a bug bite on her right posterior calf. It was itchy, red, and raised. By the end of September, an ulcer had developed. She has been applying various topical creams such as hydrocortisone and others to the site. When it was not improving, she made an appointment in the wound care [name], [name] ([id number]) [phone].pdf Page 3 of 10 center. ABI in clinic today was 0.97. On her right posterior calf, there is a circular wound with necrotic fat and black eschar present. There is no purulent drainage or malodor. The periwound skin is in good condition with just a little induration that appears to be secondary to inflammation. 01/31/2022: The wound measures a little bit larger today, but overall is quite a bit cleaner. There is some undermining from 9:00 to 3:00. She is having some periwound itching and says that her wrap slid. 02/08/2022: Despite using an Unna boot first layer at the top of her wrap, it slid again and it looks like there is been some bruising at the wound site. The wound is about the same size in terms of dimensions. There is a fair amount of slough and nonviable tissue still present. Edema control is better than last week. 02/15/2022: The wound dimensions are about the same. There is less nonviable tissue present. The periwound erythema has improved. 02/22/2022: The wound has deteriorated over the past week. It is larger and the periwound is more edematous, erythematous, and indurated. It looks as though there has been more tissue breakdown with undermining  present. She is having more pain. There is a foul odor coming from the wound. 02/26/2022: The wound looks quite a bit better today and the odor is gone. I still do not have her culture data back, but she seems to be responding well to the Augmentin. 03/06/2022: Her wound continues to improve. There is still some slough accumulation, but the periwound skin is less inflamed. Her culture returned with a polymicrobial population including Pseudomonas and so levofloxacin was also prescribed. She did not understand why a second antibiotic was being added so she has not yet initiated this. 03/14/2022: The wound is about the same, to perhaps a little bit larger. There is a fair amount of slough accumulation on the surface, as well as some hypertrophic granulation tissue. She is still taking levofloxacin, but has completed taking Augmentin. She has her Jodie Echevaria compound with her today. 12/14; the patient has a significant circular wound on the posterior right calf. She is using Keystone and silver alginate under 3 layer compression. Her ABIs were within normal limits at 0.97. This may have been traumatic or an insect bite at the start I reviewed these records. 04/04/2022: Since I last saw the wound, it has contracted considerably. There is a fairly thick layer of slough on the surface, as it has not been debrided since the last time I did it. Edema control is excellent and the periwound skin is in much better condition. Electronic Signature(s) Signed: 04/04/2022 8:51:49 AM By: [name] MD FACS Entered By: [name] on 04/04/2022 08:51:49 -------------------------------------------------------------------------------- Physical Exam  Details Patient Name: Date of Service: [name] 04/04/2022 8:15 A M Medical Record Number: [id number] Patient Account Number: [id number] Date of Birth/Sex: Treating RN: 12/24/1960 (61 y.o. F) Primary Care Provider: Gerilyn Pilgrim, BA RBA RA Other  Clinician: Referring Provider: Treating Provider/Extender: Renelda Mom EL, BA RBA RA Weeks in Treatment: 10 Constitutional . Slightly tachycardic, asymptomatic.. . . No acute distress. Respiratory Normal work of breathing on room air. Notes 04/04/2022: The wound has contracted considerably. There is a fairly thick layer of slough on the surface. Edema control is excellent and the periwound skin is in much better condition. Electronic Signature(s) Signed: 04/04/2022 8:52:57 AM By: [name] MD FACS Entered By: [name] on 04/04/2022 08:52:57 -------------------------------------------------------------------------------- Physician Orders Details Patient Name: Date of Service: [name], [name] 04/04/2022 8:15 A M Medical Record Number: [id number] Patient Account Number: [id number] Date of Birth/Sex: Treating RN: Sep 26, 1960 (61 y.o. Kateri Mc Primary Care Provider: Gerilyn Pilgrim, BA RBA RA Other Clinician: BRYELLE, [name] ([id number]) [id number] Page 4 of 10 Referring Provider: Treating Provider/Extender: [name] MICHA EL, BA RBA RA Weeks in Treatment: 10 Verbal / Phone Orders: No Diagnosis Coding ICD-10 Coding Code Description [phone] Non-pressure chronic ulcer of right calf with fat layer exposed E66.01 Morbid (severe) obesity due to excess calories I10 Essential (primary) hypertension R60.0 Localized edema Follow-up Appointments ppointment in 1 week. - Dr. Lady Gary - Room 2 Return A Anesthetic (In clinic) Topical Lidocaine 5% applied to wound bed Edema Control - Lymphedema / SCD / Other Bilateral Lower Extremities Avoid standing for long periods of time. Exercise regularly Moisturize legs daily. Compression stocking or Garment 20-30 mm/Hg pressure to: - left leg daily. Apply first thing in the morning, remove at night. Wound Treatment Wound #2 - Lower Leg Wound Laterality: Right, Posterior Cleanser:  Soap and Water 1 x Per Week/30 Days Discharge Instructions: May shower and wash wound with dial antibacterial soap and water prior to dressing change. Cleanser: Wound Cleanser 1 x Per Week/30 Days Discharge Instructions: Cleanse the wound with wound cleanser prior to applying a clean dressing using gauze sponges, not tissue or cotton balls. Peri-Wound Care: Triamcinolone 15 (g) 1 x Per Week/30 Days Discharge Instructions: Use triamcinolone 15 (g) as directed Peri-Wound Care: Sween Lotion (Moisturizing lotion) 1 x Per Week/30 Days Discharge Instructions: Apply moisturizing lotion as directed Prim Dressing: Sorbalgon AG Dressing 2x2 (in/in) 1 x Per Week/30 Days ary Discharge Instructions: Apply to wound bed as instructed Secondary Dressing: Woven Gauze Sponge, Non-Sterile 4x4 in 1 x Per Week/30 Days Discharge Instructions: Apply over primary dressing as directed. Secondary Dressing: Zetuvit Plus 4x8 in 1 x Per Week/30 Days Discharge Instructions: Apply over primary dressing as directed. Compression Wrap: ThreePress (3 layer compression wrap) 1 x Per Week/30 Days Discharge Instructions: Apply three layer compression as directed. Compression Wrap: Unnaboot w/Calamine, 4x10 (in/yd) 1 x Per Week/30 Days Discharge Instructions: Apply Unnaboot at top of leg Electronic Signature(s) Signed: 04/04/2022 10:27:26 AM By: [name] MD FACS Entered By: [name] on 04/04/2022 08:53:08 -------------------------------------------------------------------------------- Problem List Details Patient Name: Date of Service: [name], [name] 04/04/2022 8:15 A M Medical Record Number: [id number] Patient Account Number: [id number] Date of Birth/Sex: Treating RN: 08/29/1960 (61 y.o. F) Primary Care Provider: Gerilyn Pilgrim, BA RBA RA Other Clinician: LEEZA, [name] ([id number]) [id number] Page 5 of 10 Referring Provider: Treating Provider/Extender: [name] MICHA  EL, BA RBA RA Weeks in Treatment: 10 Active Problems ICD-10 Encounter Code Description Active  Date MDM Diagnosis L97.212 Non-pressure chronic ulcer of right calf with fat layer exposed 01/23/2022 No Yes E66.01 Morbid (severe) obesity due to excess calories 01/23/2022 No Yes I10 Essential (primary) hypertension 01/23/2022 No Yes R60.0 Localized edema 01/23/2022 No Yes Inactive Problems Resolved Problems Electronic Signature(s) Signed: 04/04/2022 8:50:46 AM By: Duanne Guessannon, Saragrace Selke MD FACS Entered By: Duanne Guessannon, Taron Mondor on 04/04/2022 08:50:45 -------------------------------------------------------------------------------- Progress Note Details Patient Name: Date of Service: [name] [name], [name] [name] 04/04/2022 8:15 A M Medical Record Number: [id number] Patient Account Number: [id number] Date of Birth/Sex: Treating RN: 05/13/1960 (61 y.o. F) Primary Care Provider: Gerily[facility] PilgrimMICHA EL, [facility] Other Clinician: Referring Provider: Treating Provider/Extender: Renelda Momannon, Manar Smalling MICHA EL, BA RBA RA Weeks in Treatment: 10 Subjective Chief Complaint Information obtained from Patient RLE ulcer History of Present Illness (HPI) 02/16/2020 upon evaluation today patient actually appears to be doing poorly in regard to her left medial lower extremity ulcer. This is actually an area that she tells me she has had intermittent issues with over the years although has been closed for some time she typically uses compression right now she has juxta lite compression wraps. With that being said she tells me that this nonetheless open several weeks/months ago and has been given her trouble since. She does have a history of chronic venous insufficiency she is seeing specialist for this in the past she has had an ablation as well as sclerotherapy. With that being said she also has hypertension chronically which is managed by her primary care provider. In general she seems to be worsening overall with regard to the wound and  states that she finally realized that she needed to come in and have somebody look at this and not continue to try to manage this on her own. No fevers, chills, nausea, vomiting, or diarrhea. 02/23/2020 on evaluation today patient appears to be doing well with regard to her wound. This is showing some signs of improvement which is great news still were not quite at the point where I would like to be as far as the overall appearance of the wound is concerned but I do believe this is better than last week. I do believe the Iodoflex is helping as well. 03/08/2020 upon evaluation today patient appears to be doing well with regard to her wound. She has been tolerating the dressing changes without complication. Fortunately I feel like she has made great progress with the Iodoflex but I feel like it may be the point rest to switch to something else possibly a collagen- based dressing at this time. 03/15/2020 upon evaluation today patient appears to be doing excellent in regard to her leg ulcer. She has been tolerating the dressing changes without complication. Fortunately there is no signs of active infection. Overall she is measuring a little bit smaller today which is great news. 03/22/2020 upon evaluation today patient appears to be doing well with regard to her wound. She has been tolerating the dressing changes without complication. [name], [name] ([id number]) [id number] Page 6 of 10 Fortunately there is no signs of active infection at this time. 03/28/2020; patient I do not usually see however she has a wound on the left anterior lower leg secondary to chronic venous insufficiency we have been using silver collagen under compression. She arrives in clinic with a nonviable surface requiring debridement 04/12/2020 upon evaluation today patient appears to be doing well all things considered with regard to her leg ulcer. She is tolerating the dressing changes  without complication there is minimal dry  skin around the edges of the wound that may be trapping and stopping some of the events of the new skin I am can work on that today. Otherwise the surface of the wound appears to be doing excellent. 04/19/2020 upon evaluation today patient appears to be doing well with regard to her leg ulcer. She has been tolerating dressing changes without complication. Fortunately there is no signs of active infection at this time. No fever chills noted overall very pleased with how things seem to be progressing. 04/26/2020 on evaluation today patient appears to be doing well with regard to her wound currently. Is showing signs of excellent improvement overall is filling in nicely and there does not appear to be any signs of infection. No fevers, chills, nausea, vomiting, or diarrhea. 05/03/2020 upon evaluation today patient appears to be doing well with regard to her wound on the leg. This overall showing signs of good improvement which is great she has some good epithelial growth and overall I think that things are moving in the correct direction. We likewise going to continue with the wound care measures as before since she seems to making such good improvement. 2/3; venous wound on the left medial leg. This is contracting. We are using Prisma and 3 layer compression. She has a stocking and waiting in the eventuality this heals. She is already using it on the right 05/17/2020 upon evaluation today patient appears to be doing well with regard to her leg ulcer. She has been tolerating the dressing changes without complication. Fortunately there is no signs of active infection which is great news and overall very pleased with where things stand today. No fevers, chills, nausea, vomiting, or diarrhea. 05/24/2020 upon evaluation today patient appears to be doing well with regard to her wound. Overall I feel like she is making excellent progress. There does not appear to be any  signs of active infection which is great news. 05/31/2020 upon evaluation today patient appears to be doing well with regard to her wound. There does not appear to be any signs of active infection which is great news overall I am extremely pleased with where things stand today. READMISSION 01/23/2022 She returns to clinic today with a new wound on her right posterior calf. She says that she was cleaning out an old shed near the middle of August this year and then noticed what seemed to be a bug bite on her right posterior calf. It was itchy, red, and raised. By the end of September, an ulcer had developed. She has been applying various topical creams such as hydrocortisone and others to the site. When it was not improving, she made an appointment in the wound care center. ABI in clinic today was 0.97. On her right posterior calf, there is a circular wound with necrotic fat and black eschar present. There is no purulent drainage or malodor. The periwound skin is in good condition with just a little induration that appears to be secondary to inflammation. 01/31/2022: The wound measures a little bit larger today, but overall is quite a bit cleaner. There is some undermining from 9:00 to 3:00. She is having some periwound itching and says that her wrap slid. 02/08/2022: Despite using an Unna boot first layer at the top of her wrap, it slid again and it looks like there is been some bruising at the wound site. The wound is about the same size in terms of dimensions. There is a fair amount of slough and nonviable tissue still present.  Edema control is better than last week. 02/15/2022: The wound dimensions are about the same. There is less nonviable tissue present. The periwound erythema has improved. 02/22/2022: The wound has deteriorated over the past week. It is larger and the periwound is more edematous, erythematous, and indurated. It looks as though there has been more tissue breakdown with undermining  present. She is having more pain. There is a foul odor coming from the wound. 02/26/2022: The wound looks quite a bit better today and the odor is gone. I still do not have her culture data back, but she seems to be responding well to the Augmentin. 03/06/2022: Her wound continues to improve. There is still some slough accumulation, but the periwound skin is less inflamed. Her culture returned with a polymicrobial population including Pseudomonas and so levofloxacin was also prescribed. She did not understand why a second antibiotic was being added so she has not yet initiated this. 03/14/2022: The wound is about the same, to perhaps a little bit larger. There is a fair amount of slough accumulation on the surface, as well as some hypertrophic granulation tissue. She is still taking levofloxacin, but has completed taking Augmentin. She has her Jodie Echevaria compound with her today. 12/14; the patient has a significant circular wound on the posterior right calf. She is using Keystone and silver alginate under 3 layer compression. Her ABIs were within normal limits at 0.97. This may have been traumatic or an insect bite at the start I reviewed these records. 04/04/2022: Since I last saw the wound, it has contracted considerably. There is a fairly thick layer of slough on the surface, as it has not been debrided since the last time I did it. Edema control is excellent and the periwound skin is in much better condition. Patient History Information obtained from Patient. Family History Cancer - Mother, Diabetes - Father, Hypertension - Mother, Kidney Disease - Mother, Lung Disease - Father, Thyroid Problems - Paternal Grandparents, No family history of Heart Disease, Seizures, Stroke, Tuberculosis. Social History Never smoker, Marital Status - Married, Alcohol Use - Never, Drug Use - No History, Caffeine Use - Daily - coffee. Medical History Eyes Denies history of Cataracts, Glaucoma, Optic  Neuritis Ear/Nose/Mouth/Throat Denies history of Chronic sinus problems/congestion, Middle ear problems Hematologic/Lymphatic Patient has history of Anemia - iron Denies history of Hemophilia, Human Immunodeficiency Virus, Lymphedema, Sickle Cell Disease Respiratory Patient has history of Sleep Apnea - CPAP Denies history of Aspiration, Asthma, Chronic Obstructive Pulmonary Disease (COPD), Pneumothorax, Tuberculosis Cardiovascular Patient has history of Hypertension, Peripheral Venous Disease Denies history of Angina, Arrhythmia, Congestive Heart Failure, Coronary Artery Disease, Deep Vein Thrombosis, Hypotension, Myocardial Infarction, Peripheral Arterial Disease, Phlebitis, Vasculitis Gastrointestinal [name], [name] ([id number]) [id number] Page 7 of 10 Denies history of Cirrhosis , Colitis, Crohnoos, Hepatitis A, Hepatitis B, Hepatitis C Endocrine Denies history of Type I Diabetes, Type II Diabetes Genitourinary Denies history of End Stage Renal Disease Immunological Denies history of Lupus Erythematosus, Raynaudoos, Scleroderma Integumentary (Skin) Denies history of History of Burn Musculoskeletal Denies history of Gout, Rheumatoid Arthritis, Osteoarthritis, Osteomyelitis Neurologic Denies history of Dementia, Neuropathy, Quadriplegia, Paraplegia, Seizure Disorder Oncologic Denies history of Received Chemotherapy, Received Radiation Psychiatric Denies history of Anorexia/bulimia, Confinement Anxiety Hospitalization/Surgery History - cholecystectomy 1980s. - nephrolithasis 1980s. - plate and rod in right elbow surgery 2010. 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 Vitals Time Taken: 8:25 AM, Temperature:  98.1 F, Pulse: 103 bpm, Respiratory Rate: 20 breaths/min, Blood Pressure:  135/83 mmHg. Respiratory Normal work of breathing on room air. General Notes: 04/04/2022: The wound has contracted considerably. There is a fairly thick layer of slough on the surface. Edema control is excellent and the periwound skin is in much better condition. Integumentary (Hair, Skin) Wound #2 status is Open. Original cause of wound was Insect Bite. The date acquired was: 11/21/2021. The wound has been in treatment 10 weeks. The wound is located on the Right,Posterior Lower Leg. The wound measures 2cm length x 2.6cm width x 0.2cm depth; 4.084cm^2 area and 0.817cm^3 volume. There is Fat Layer (Subcutaneous Tissue) exposed. There is a medium amount of serosanguineous drainage noted. The wound margin is distinct with the outline attached to the wound base. There is small (1-33%) red, friable granulation within the wound bed. There is a large (67-100%) amount of necrotic tissue within the wound bed including Adherent Slough. The periwound skin appearance had no abnormalities noted for moisture. The periwound skin appearance exhibited: Scarring, Hemosiderin Staining, Erythema. The periwound skin appearance did not exhibit: Excoriation, Atrophie Blanche, Ecchymosis, Rubor. The surrounding wound skin color is noted with erythema. Periwound temperature was noted as No Abnormality. Assessment Active Problems ICD-10 Non-pressure chronic ulcer of right calf with fat layer exposed Morbid (severe) obesity due to excess calories Essential (primary) hypertension Localized edema Procedures Wound #2 Pre-procedure diagnosis of Wound #2 is a Venous Leg Ulcer located on the Right,Posterior Lower Leg .Severity of Tissue Pre Debridement is: Fat layer exposed. There was a Excisional Skin/Subcutaneous Tissue Debridement with a total area of 5.2 sq cm performed by [name], MD. With the following instrument(s): Curette to remove Viable and Non-Viable tissue/material. Material removed includes Subcutaneous  Tissue and Slough and after achieving pain control using Lidocaine 4% T opical Solution. No specimens were taken. A time out was conducted at 08:47, prior to the start of the procedure. A Minimum amount of bleeding was controlled with Pressure. The procedure was tolerated well. Post Debridement Measurements: 2cm length x 2.6cm width x 0.2cm depth; 0.817cm^3 volume. Character of Wound/Ulcer Post Debridement is improved. Severity of Tissue Post Debridement is: Fat layer exposed. [name], [name] ([id number]) [id number] Page 8 of 10 Post procedure Diagnosis Wound #2: Same as Pre-Procedure General Notes: Scribed for Dr. Lady Gary by Tommie Ard, RN. Pre-procedure diagnosis of Wound #2 is a Venous Leg Ulcer located on the Right,Posterior Lower Leg . There was a Three Layer Compression Therapy Procedure by Tommie Ard, RN. Post procedure Diagnosis Wound #2: Same as Pre-Procedure Plan Follow-up Appointments: Return Appointment in 1 week. - Dr. Lady Gary - Room 2 Anesthetic: (In clinic) Topical Lidocaine 5% applied to wound bed Edema Control - Lymphedema / SCD / Other: Avoid standing for long periods of time. Exercise regularly Moisturize legs daily. Compression stocking or Garment 20-30 mm/Hg pressure to: - left leg daily. Apply first thing in the morning, remove at night. WOUND #2: - Lower Leg Wound Laterality: Right, Posterior Cleanser: Soap and Water 1 x Per Week/30 Days Discharge Instructions: May shower and wash wound with dial antibacterial soap and water prior to dressing change. Cleanser: Wound Cleanser 1 x Per Week/30 Days Discharge Instructions: Cleanse the wound with wound cleanser prior to applying a clean dressing using gauze sponges, not tissue or cotton balls. Peri-Wound Care: Triamcinolone 15 (g) 1 x Per Week/30 Days Discharge Instructions: Use triamcinolone 15 (g) as directed Peri-Wound Care: Sween Lotion (Moisturizing lotion) 1 x Per Week/30  Days Discharge  Instructions: Apply moisturizing lotion as directed Prim Dressing: Sorbalgon AG Dressing 2x2 (in/in) 1 x Per Week/30 Days ary Discharge Instructions: Apply to wound bed as instructed Secondary Dressing: Woven Gauze Sponge, Non-Sterile 4x4 in 1 x Per Week/30 Days Discharge Instructions: Apply over primary dressing as directed. Secondary Dressing: Zetuvit Plus 4x8 in 1 x Per Week/30 Days Discharge Instructions: Apply over primary dressing as directed. Com pression Wrap: ThreePress (3 layer compression wrap) 1 x Per Week/30 Days Discharge Instructions: Apply three layer compression as directed. Com pression Wrap: Unnaboot w/Calamine, 4x10 (in/yd) 1 x Per Week/30 Days Discharge Instructions: Apply Unnaboot at top of leg 04/04/2022: The wound has contracted considerably. There is a fairly thick layer of slough on the surface. Edema control is excellent and the periwound skin is in much better condition. I used a curette to debride slough and nonviable subcutaneous tissue from the wound. We will continue using her Keystone topical antibiotic with silver alginate and 3 layer compression. Follow-up in 1 week. Electronic Signature(s) Signed: 04/04/2022 8:53:33 AM By: [name] MD FACS Entered By: [name] on 04/04/2022 08:53:33 -------------------------------------------------------------------------------- HxROS Details Patient Name: Date of Service: [name], [name] [name] 04/04/2022 8:15 A M Medical Record Number: [id number] Patient Account Number: [id number] Date of Birth/Sex: Treating RN: 02-Aug-1960 (61 y.o. F) Primary Care Provider: Gerilyn Pilgrim, BA RBA RA Other Clinician: Referring Provider: Treating Provider/Extender: Renelda Mom EL, BA RBA RA Weeks in Treatment: 10 Information Obtained From Patient Eyes Medical History: Negative for: Cataracts; Glaucoma; Optic Neuritis [name], LAAKSO ([id number]) [phone].pdf Page 9 of  10 Ear/Nose/Mouth/Throat Medical History: Negative for: Chronic sinus problems/congestion; Middle ear problems Hematologic/Lymphatic Medical History: Positive for: Anemia - iron Negative for: Hemophilia; Human Immunodeficiency Virus; Lymphedema; Sickle Cell Disease Respiratory Medical History: Positive for: Sleep Apnea - CPAP Negative for: Aspiration; Asthma; Chronic Obstructive Pulmonary Disease (COPD); Pneumothorax; Tuberculosis Cardiovascular Medical History: Positive for: Hypertension; Peripheral Venous Disease Negative for: Angina; Arrhythmia; Congestive Heart Failure; Coronary Artery Disease; Deep Vein Thrombosis; Hypotension; Myocardial Infarction; Peripheral Arterial Disease; Phlebitis; Vasculitis Gastrointestinal Medical History: Negative for: Cirrhosis ; Colitis; Crohns; Hepatitis A; Hepatitis B; Hepatitis C Endocrine Medical History: Negative for: Type I Diabetes; Type II Diabetes Genitourinary Medical History: Negative for: End Stage Renal Disease Past Medical History Notes: years ago kidney stones Immunological Medical History: Negative for: Lupus Erythematosus; Raynauds; Scleroderma Integumentary (Skin) Medical History: Negative for: History of Burn Musculoskeletal Medical History: Negative for: Gout; Rheumatoid Arthritis; Osteoarthritis; Osteomyelitis Neurologic Medical History: Negative for: Dementia; Neuropathy; Quadriplegia; Paraplegia; Seizure Disorder Oncologic Medical History: Negative for: Received Chemotherapy; Received Radiation Past Medical History Notes: skin Ca removed from back years ago Psychiatric Medical History: Negative for: Anorexia/bulimia; Confinement Anxiety Immunizations Pneumococcal Vaccine: Received Pneumococcal Vaccination: No Implantable Devices GITEL, BESTE ([id number]) [id number] Page 10 of 10 None Hospitalization / Surgery History Type of Hospitalization/Surgery cholecystectomy  1980s nephrolithasis 1980s plate and rod in right elbow surgery 2010 Family and Social History Cancer: Yes - Mother; Diabetes: Yes - Father; Heart Disease: No; Hypertension: Yes - Mother; Kidney Disease: Yes - Mother; Lung Disease: Yes - Father; Seizures: No; Stroke: No; Thyroid Problems: Yes - Paternal Grandparents; Tuberculosis: No; Never smoker; Marital Status - Married; Alcohol Use: Never; Drug Use: No History; Caffeine Use: Daily - coffee; Financial Concerns: No; Food, Clothing or Shelter Needs: No; Support System Lacking: No; Transportation Concerns: No Electronic Signature(s) Signed: 04/04/2022 10:27:26 AM By: [name] MD FACS Entered By: [name] on 04/04/2022 08:51:55 -------------------------------------------------------------------------------- SuperBill Details Patient Name: Date of Service:  [address] [place], [name] 04/04/2022 Medical Record Number: [id number] Patient Account Number: [id number] Date of Birth/Sex: Treating RN: [birth year] (61 y.o. F) Primary Care Provider: Gerilyn Pilgrim, BA RBA RA Other Clinician: Referring Provider: Treating Provider/Extender: Renelda Mom EL, BA RBA RA Weeks in Treatment: 10 Diagnosis Coding ICD-10 Codes Code Description [phone] Non-pressure chronic ulcer of right calf with fat layer exposed E66.01 Morbid (severe) obesity due to excess calories I10 Essential (primary) hypertension R60.0 Localized edema Facility Procedures : CPT4 Code: [id number] Description: 11042 - DEB SUBQ TISSUE 20 SQ CM/< ICD-10 Diagnosis Description L97.212 Non-pressure chronic ulcer of right calf with fat layer exposed Modifier: Quantity: 1 Physician Procedures : CPT4 Code Description Modifier [id number] 99214 - WC PHYS LEVEL 4 - EST PT 25 ICD-10 Diagnosis Description L97.212 Non-pressure chronic ulcer of right calf with fat layer exposed R60.0 Localized edema E66.01 Morbid (severe) obesity due to excess calories  I10 Essential (primary)  hypertension Quantity: 1 : [id number] 11042 - WC PHYS SUBQ TISS 20 SQ CM ICD-10 Diagnosis Description L97.212 Non-pressure chronic ulcer of right calf with fat layer exposed Quantity: 1 Electronic Signature(s) Signed: 04/04/2022 8:53:54 AM By: [name] MD FACS Entered By: [name] on 04/04/2022 08:53:54

## 2022-04-12 ENCOUNTER — Encounter (HOSPITAL_BASED_OUTPATIENT_CLINIC_OR_DEPARTMENT_OTHER): Payer: BC Managed Care – PPO | Attending: General Surgery | Admitting: General Surgery

## 2022-04-12 DIAGNOSIS — L97212 Non-pressure chronic ulcer of right calf with fat layer exposed: Secondary | ICD-10-CM | POA: Diagnosis present

## 2022-04-12 DIAGNOSIS — R600 Localized edema: Secondary | ICD-10-CM | POA: Diagnosis not present

## 2022-04-12 DIAGNOSIS — I10 Essential (primary) hypertension: Secondary | ICD-10-CM | POA: Insufficient documentation

## 2022-04-13 NOTE — Progress Notes (Signed)
[name], [name] ([id number]) [id number] Page 1 of 11 Visit Report for 04/12/2022 Chief Complaint Document Details Patient Name: Date of Service: [name] 04/12/2022 2:45 PM Medical Record Number: [id number] Patient Account Number: [id number] Date of Birth/Sex: Treating RN: 09/15/1960 (62 y.o. F) Primary Care Provider: Gerilyn PilgrimMICHA [name], [name] Other Clinician: Referring Provider: Treating Provider/Extender: [name], [name], BA RBA [name] Weeks in Treatment: 11 Information Obtained from: Patient Chief Complaint RLE ulcer Electronic Signature(s) Signed: 04/12/2022 3:15:09 PM By: Duanne Guessannon, Karmyn Lowman MD FACS Entered By: Duanne Guessannon, Federica Allport on 04/12/2022 15:15:09 -------------------------------------------------------------------------------- Debridement Details Patient Name: Date of Service: [name] 04/12/2022 2:45 PM Medical Record Number: [id number] Patient Account Number: [id number] Date of Birth/Sex: Treating RN: 04/08/1961 (62 y.o. Fredderick PhenixF) [name], [name] Primary Care Provider: Gerilyn PilgrimMICHA [name], BA RBA [name] Other Clinician: Referring Provider: Treating Provider/Extender: [name], [name], BA RBA [name] Weeks in Treatment: 11 Debridement Performed for Assessment: Wound #2 Right,Posterior Lower Leg Performed By: Physician Duanne Guessannon, Analeese Andreatta, MD Debridement Type: Debridement Severity of Tissue Pre Debridement: Fat layer exposed Level of Consciousness (Pre-procedure): Awake and Alert Pre-procedure Verification/Time Out Yes - 15:01 Taken: Start Time: 15:01 Pain Control: Lidocaine 5% topical ointment T Area Debrided (L x W): otal 2.6 (cm) x 3.1 (cm) = 8.06 (cm) Tissue and other material debrided: Non-Viable, Slough, Subcutaneous, Slough Level: Skin/Subcutaneous Tissue Debridement Description: Excisional Instrument: Curette Bleeding: Minimum Hemostasis Achieved: Pressure Response to Treatment: Procedure was tolerated well Level of Consciousness  (Post- Awake and Alert procedure): Post Debridement Measurements of Total Wound Length: (cm) 2.6 Width: (cm) 3.1 Depth: (cm) 0.2 Volume: (cm) 1.266 Character of Wound/Ulcer Post Debridement: Improved Severity of Tissue Post Debridement: Fat layer exposed Post Procedure Diagnosis Same as [name] ([id number]) [id number] Page 2 of 11 Notes scribed for Dr. Lady Garyannon by [name] Bruinaylor Herrington, RN Electronic Signature(s) Signed: 04/12/2022 3:18:58 PM By: Duanne Guessannon, Aanya Haynes MD FACS Signed: 04/12/2022 3:48:53 PM By: Gelene MinkHerrington, [name] Entered By: [name] BruinHerrington, [name] on 04/12/2022 15:03:04 -------------------------------------------------------------------------------- HPI Details Patient Name: Date of Service: [name] [name], [name] 04/12/2022 2:45 PM Medical Record Number: [id number] Patient Account Number: [id number] Date of Birth/Sex: Treating RN: 03/10/1961 (62 y.o. F) Primary Care Provider: Gerilyn PilgrimMICHA [name], [name] Other Clinician: Referring Provider: Treating Provider/Extender: [name], Blandon Offerdahl MICHA [name], BA RBA [name] Weeks in Treatment: 11 History of Present Illness HPI Description: 02/16/2020 upon evaluation today patient actually appears to be doing poorly in regard to her left medial lower extremity ulcer. This is actually an area that she tells me she has had intermittent issues with over the years although has been closed for some time she typically uses compression right now she has juxta lite compression wraps. With that being said she tells me that this nonetheless open several weeks/months ago and has been given her trouble since. She does have a history of chronic venous insufficiency she is seeing specialist for this in the past she has had an ablation as well as sclerotherapy. With that being said she also has hypertension chronically which is managed by her primary care provider. In general she seems to be worsening overall with regard to the  wound and states that she finally realized that she needed to come in and have somebody look at this and not continue to try to manage this on her own. No fevers, chills, nausea, vomiting, or diarrhea. 02/23/2020 on evaluation today patient appears to be doing well with regard to her wound. This is showing some signs of improvement which  is great news still were not quite at the point where I would like to be as far as the overall appearance of the wound is concerned but I do believe this is better than last week. I do believe the Iodoflex is helping as well. 03/08/2020 upon evaluation today patient appears to be doing well with regard to her wound. She has been tolerating the dressing changes without complication. Fortunately I feel like she has made great progress with the Iodoflex but I feel like it may be the point rest to switch to something else possibly a collagen- based dressing at this time. 03/15/2020 upon evaluation today patient appears to be doing excellent in regard to her leg ulcer. She has been tolerating the dressing changes without complication. Fortunately there is no signs of active infection. Overall she is measuring a little bit smaller today which is great news. 03/22/2020 upon evaluation today patient appears to be doing well with regard to her wound. She has been tolerating the dressing changes without complication. Fortunately there is no signs of active infection at this time. 03/28/2020; patient I do not usually see however she has a wound on the left anterior lower leg secondary to chronic venous insufficiency we have been using silver collagen under compression. She arrives in clinic with a nonviable surface requiring debridement 04/12/2020 upon evaluation today patient appears to be doing well all things considered with regard to her leg ulcer. She is tolerating the dressing changes without complication there is minimal dry skin around the edges of the wound that may be  trapping and stopping some of the events of the new skin I am can work on that today. Otherwise the surface of the wound appears to be doing excellent. 04/19/2020 upon evaluation today patient appears to be doing well with regard to her leg ulcer. She has been tolerating dressing changes without complication. Fortunately there is no signs of active infection at this time. No fever chills noted overall very pleased with how things seem to be progressing. 04/26/2020 on evaluation today patient appears to be doing well with regard to her wound currently. Is showing signs of excellent improvement overall is filling in nicely and there does not appear to be any signs of infection. No fevers, chills, nausea, vomiting, or diarrhea. 05/03/2020 upon evaluation today patient appears to be doing well with regard to her wound on the leg. This overall showing signs of good improvement which is great she has some good epithelial growth and overall I think that things are moving in the correct direction. We likewise going to continue with the wound care measures as before since she seems to making such good improvement. 2/3; venous wound on the left medial leg. This is contracting. We are using Prisma and 3 layer compression. She has a stocking and waiting in the eventuality this heals. She is already using it on the right 05/17/2020 upon evaluation today patient appears to be doing well with regard to her leg ulcer. She has been tolerating the dressing changes without complication. Fortunately there is no signs of active infection which is great news and overall very pleased with where things stand today. No fevers, chills, nausea, vomiting, or diarrhea. 05/24/2020 upon evaluation today patient appears to be doing well with regard to her wound. Overall I feel like she is making excellent progress. There does not appear to be any signs of active infection which is great news. 05/31/2020 upon evaluation today patient  appears to be doing well with  regard to her wound. There does not appear to be any signs of active infection which is great news overall I am extremely pleased with where things stand today. READMISSION 01/23/2022 She returns to clinic today with a new wound on her right posterior calf. She says that she was cleaning out an old shed near the middle of August this year and then noticed what seemed to be a bug bite on her right posterior calf. It was itchy, red, and raised. By the end of September, an ulcer had developed. She has been applying various topical creams such as hydrocortisone and others to the site. When it was not improving, she made an appointment in the wound care CARMELIA, TINER ([id number]) [phone].pdf Page 3 of 11 center. ABI in clinic today was 0.97. On her right posterior calf, there is a circular wound with necrotic fat and black eschar present. There is no purulent drainage or malodor. The periwound skin is in good condition with just a little induration that appears to be secondary to inflammation. 01/31/2022: The wound measures a little bit larger today, but overall is quite a bit cleaner. There is some undermining from 9:00 to 3:00. She is having some periwound itching and says that her wrap slid. 02/08/2022: Despite using an Unna boot first layer at the top of her wrap, it slid again and it looks like there is been some bruising at the wound site. The wound is about the same size in terms of dimensions. There is a fair amount of slough and nonviable tissue still present. Edema control is better than last week. 02/15/2022: The wound dimensions are about the same. There is less nonviable tissue present. The periwound erythema has improved. 02/22/2022: The wound has deteriorated over the past week. It is larger and the periwound is more edematous, erythematous, and indurated. It looks as though there has been more tissue breakdown with undermining  present. She is having more pain. There is a foul odor coming from the wound. 02/26/2022: The wound looks quite a bit better today and the odor is gone. I still do not have her culture data back, but she seems to be responding well to the Augmentin. 03/06/2022: Her wound continues to improve. There is still some slough accumulation, but the periwound skin is less inflamed. Her culture returned with a polymicrobial population including Pseudomonas and so levofloxacin was also prescribed. She did not understand why a second antibiotic was being added so she has not yet initiated this. 03/14/2022: The wound is about the same, to perhaps a little bit larger. There is a fair amount of slough accumulation on the surface, as well as some hypertrophic granulation tissue. She is still taking levofloxacin, but has completed taking Augmentin. She has her [employer] compound with her today. 12/14; the patient has a significant circular wound on the posterior right calf. She is using [employer] and silver alginate under 3 layer compression. Her ABIs were within normal limits at 0.97. This may have been traumatic or an insect bite at the start I reviewed these records. 04/04/2022: Since I last saw the wound, it has contracted considerably. There is a fairly thick layer of slough on the surface, as it has not been debrided since the last time I did it. Edema control is excellent and the periwound skin is in much better condition. 04/12/2022: No significant change in the wound dimensions. It is filling with granulation tissue. Still with slough accumulation on the surface. Electronic Signature(s) Signed: 04/12/2022 3:15:47 PM  By: [name] MD FACS Entered By: [name] on 04/12/2022 15:15:46 -------------------------------------------------------------------------------- Physical Exam Details Patient Name: Date of Service: [name], [name] 04/12/2022 2:45 PM Medical Record Number: [id number] Patient  Account Number: [id number] Date of Birth/Sex: Treating RN: 03/02/1961 (62 y.o. F) Primary Care Provider: Gerilyn Pilgrim, BA RBA [name] Other Clinician: Referring Provider: Treating Provider/Extender: [name] Mom [name], BA RBA [name] Weeks in Treatment: 11 Constitutional Slightly hypertensive. . . . no acute distress. Respiratory Normal work of breathing on room air. Notes 04/12/2022: No significant change in the wound dimensions. It is filling with granulation tissue. Still with slough accumulation on the surface. Electronic Signature(s) Signed: 04/12/2022 3:17:12 PM By: [name] MD FACS Entered By: [name] on 04/12/2022 15:17:11 -------------------------------------------------------------------------------- Physician Orders Details Patient Name: Date of Service: [address] [name] 04/12/2022 2:45 PM Medical Record Number: [id number] Patient Account Number: [id number] Date of Birth/Sex: Treating RN: 10/26/1960 (62 y.o. [name], [name], [name] ([id number]) [phone].pdf Page 4 of 11 Primary Care Provider: Gerilyn Pilgrim, BA RBA [name] Other Clinician: Referring Provider: Treating Provider/Extender: [name] Mom [name], BA RBA [name] Weeks in Treatment: 20 Verbal / Phone Orders: No Diagnosis Coding ICD-10 Coding Code Description [phone] Non-pressure chronic ulcer of right calf with fat layer exposed E66.01 Morbid (severe) obesity due to excess calories I10 Essential (primary) hypertension R60.0 Localized edema Follow-up Appointments ppointment in 1 week. - Dr. Lady Gary - Room 2 Return A Anesthetic (In clinic) Topical Lidocaine 5% applied to wound bed Bathing/ Shower/ Hygiene May shower with protection but do not get wound dressing(s) wet. Protect dressing(s) with water repellant cover (for example, large plastic bag) or a cast cover and may then take shower. Edema Control - Lymphedema / SCD / Other Bilateral Lower Extremities Avoid  standing for long periods of time. Exercise regularly Moisturize legs daily. Compression stocking or Garment 20-30 mm/Hg pressure to: - left leg daily. Apply first thing in the morning, remove at night. Wound Treatment Wound #2 - Lower Leg Wound Laterality: Right, Posterior Cleanser: Soap and Water 1 x Per Week/30 Days Discharge Instructions: May shower and wash wound with dial antibacterial soap and water prior to dressing change. Cleanser: Wound Cleanser 1 x Per Week/30 Days Discharge Instructions: Cleanse the wound with wound cleanser prior to applying a clean dressing using gauze sponges, not tissue or cotton balls. Peri-Wound Care: Triamcinolone 15 (g) 1 x Per Week/30 Days Discharge Instructions: Use triamcinolone 15 (g) as directed Peri-Wound Care: Sween Lotion (Moisturizing lotion) 1 x Per Week/30 Days Discharge Instructions: Apply moisturizing lotion as directed Prim Dressing: Hydrofera Blue Ready Transfer Foam, 2.5x2.5 (in/in) 1 x Per Week/30 Days ary Discharge Instructions: Apply directly to wound bed as directed Secondary Dressing: Woven Gauze Sponge, Non-Sterile 4x4 in 1 x Per Week/30 Days Discharge Instructions: Apply over primary dressing as directed. Secondary Dressing: Zetuvit Plus 4x8 in 1 x Per Week/30 Days Discharge Instructions: Apply over primary dressing as directed. Compression Wrap: ThreePress (3 layer compression wrap) 1 x Per Week/30 Days Discharge Instructions: Apply three layer compression as directed. Compression Wrap: Unnaboot w/Calamine, 4x10 (in/yd) 1 x Per Week/30 Days Discharge Instructions: Apply Unnaboot at top of leg Patient Medications llergies: Iodinated Contrast Media, Sulfa (Sulfonamide Antibiotics) A Notifications Medication Indication Start End 04/12/2022 lidocaine DOSE topical 5 % ointment - ointment topical Electronic Signature(s) Signed: 04/12/2022 3:18:58 PM By: [name] MD FACS Entered By: [name] on 04/12/2022  15:17:28 [name] ([id number]) [id number] Page 5 of 11 -------------------------------------------------------------------------------- Problem  List Details Patient Name: Date of Service: [name] 04/12/2022 2:45 PM Medical Record Number: [id number] Patient Account Number: [id number] Date of Birth/Sex: Treating RN: Mar 07, 1961 (62 y.o. F) Primary Care Provider: Gerilyn Pilgrim, [facility] RBA [name] Other Clinician: Referring Provider: Treating Provider/Extender: [name] Mom [name], BA RBA [name] Weeks in Treatment: 11 Active Problems ICD-10 Encounter Code Description Active Date MDM Diagnosis L97.212 Non-pressure chronic ulcer of right calf with fat layer exposed 01/23/2022 No Yes E66.01 Morbid (severe) obesity due to excess calories 01/23/2022 No Yes I10 Essential (primary) hypertension 01/23/2022 No Yes R60.0 Localized edema 01/23/2022 No Yes Inactive Problems Resolved Problems Electronic Signature(s) Signed: 04/12/2022 3:14:34 PM By: [name] MD FACS Entered By: [name] on 04/12/2022 15:14:33 -------------------------------------------------------------------------------- Progress Note Details Patient Name: Date of Service: [name], [name] 04/12/2022 2:45 PM Medical Record Number: [id number] Patient Account Number: [id number] Date of Birth/Sex: Treating RN: 03-28-1961 (62 y.o. F) Primary Care Provider: Gerilyn Pilgrim, [facility] RBA [name] Other Clinician: Referring Provider: Treating Provider/Extender: [name] Mom [name], BA RBA [name] Weeks in Treatment: 11 Subjective Chief Complaint Information obtained from Patient RLE ulcer History of Present Illness (HPI) 02/16/2020 upon evaluation today patient actually appears to be doing poorly in regard to her left medial lower extremity ulcer. This is actually an area that she tells me she has had intermittent issues with over the years although has been closed for some time she typically uses  compression right now she has juxta lite compression wraps. With that being said she tells me that this nonetheless open several weeks/months ago and has been given her trouble since. She does have a history of chronic venous insufficiency she is seeing specialist for this in the past she has had an ablation as well as sclerotherapy. With that being MAIRIN, LINDSLEY ([id number]) [id number] Page 6 of 11 said she also has hypertension chronically which is managed by her primary care provider. In general she seems to be worsening overall with regard to the wound and states that she finally realized that she needed to come in and have somebody look at this and not continue to try to manage this on her own. No fevers, chills, nausea, vomiting, or diarrhea. 02/23/2020 on evaluation today patient appears to be doing well with regard to her wound. This is showing some signs of improvement which is great news still were not quite at the point where I would like to be as far as the overall appearance of the wound is concerned but I do believe this is better than last week. I do believe the Iodoflex is helping as well. 03/08/2020 upon evaluation today patient appears to be doing well with regard to her wound. She has been tolerating the dressing changes without complication. Fortunately I feel like she has made great progress with the Iodoflex but I feel like it may be the point rest to switch to something else possibly a collagen- based dressing at this time. 03/15/2020 upon evaluation today patient appears to be doing excellent in regard to her leg ulcer. She has been tolerating the dressing changes without complication. Fortunately there is no signs of active infection. Overall she is measuring a little bit smaller today which is great news. 03/22/2020 upon evaluation today patient appears to be doing well with regard to her wound. She has been tolerating the dressing changes  without complication. Fortunately there is no signs of active infection at this time. 03/28/2020; patient I do not usually see however she  has a wound on the left anterior lower leg secondary to chronic venous insufficiency we have been using silver collagen under compression. She arrives in clinic with a nonviable surface requiring debridement 04/12/2020 upon evaluation today patient appears to be doing well all things considered with regard to her leg ulcer. She is tolerating the dressing changes without complication there is minimal dry skin around the edges of the wound that may be trapping and stopping some of the events of the new skin I am can work on that today. Otherwise the surface of the wound appears to be doing excellent. 04/19/2020 upon evaluation today patient appears to be doing well with regard to her leg ulcer. She has been tolerating dressing changes without complication. Fortunately there is no signs of active infection at this time. No fever chills noted overall very pleased with how things seem to be progressing. 04/26/2020 on evaluation today patient appears to be doing well with regard to her wound currently. Is showing signs of excellent improvement overall is filling in nicely and there does not appear to be any signs of infection. No fevers, chills, nausea, vomiting, or diarrhea. 05/03/2020 upon evaluation today patient appears to be doing well with regard to her wound on the leg. This overall showing signs of good improvement which is great she has some good epithelial growth and overall I think that things are moving in the correct direction. We likewise going to continue with the wound care measures as before since she seems to making such good improvement. 2/3; venous wound on the left medial leg. This is contracting. We are using Prisma and 3 layer compression. She has a stocking and waiting in the eventuality this heals. She is already using it on the right 05/17/2020 upon  evaluation today patient appears to be doing well with regard to her leg ulcer. She has been tolerating the dressing changes without complication. Fortunately there is no signs of active infection which is great news and overall very pleased with where things stand today. No fevers, chills, nausea, vomiting, or diarrhea. 05/24/2020 upon evaluation today patient appears to be doing well with regard to her wound. Overall I feel like she is making excellent progress. There does not appear to be any signs of active infection which is great news. 05/31/2020 upon evaluation today patient appears to be doing well with regard to her wound. There does not appear to be any signs of active infection which is great news overall I am extremely pleased with where things stand today. READMISSION 01/23/2022 She returns to clinic today with a new wound on her right posterior calf. She says that she was cleaning out an old shed near the middle of August this year and then noticed what seemed to be a bug bite on her right posterior calf. It was itchy, red, and raised. By the end of September, an ulcer had developed. She has been applying various topical creams such as hydrocortisone and others to the site. When it was not improving, she made an appointment in the wound care center. ABI in clinic today was 0.97. On her right posterior calf, there is a circular wound with necrotic fat and black eschar present. There is no purulent drainage or malodor. The periwound skin is in good condition with just a little induration that appears to be secondary to inflammation. 01/31/2022: The wound measures a little bit larger today, but overall is quite a bit cleaner. There is some undermining from 9:00 to 3:00. She is  having some periwound itching and says that her wrap slid. 02/08/2022: Despite using an Unna boot first layer at the top of her wrap, it slid again and it looks like there is been some bruising at the wound site. The  wound is about the same size in terms of dimensions. There is a fair amount of slough and nonviable tissue still present. Edema control is better than last week. 02/15/2022: The wound dimensions are about the same. There is less nonviable tissue present. The periwound erythema has improved. 02/22/2022: The wound has deteriorated over the past week. It is larger and the periwound is more edematous, erythematous, and indurated. It looks as though there has been more tissue breakdown with undermining present. She is having more pain. There is a foul odor coming from the wound. 02/26/2022: The wound looks quite a bit better today and the odor is gone. I still do not have her culture data back, but she seems to be responding well to the Augmentin. 03/06/2022: Her wound continues to improve. There is still some slough accumulation, but the periwound skin is less inflamed. Her culture returned with a polymicrobial population including Pseudomonas and so levofloxacin was also prescribed. She did not understand why a second antibiotic was being added so she has not yet initiated this. 03/14/2022: The wound is about the same, to perhaps a little bit larger. There is a fair amount of slough accumulation on the surface, as well as some hypertrophic granulation tissue. She is still taking levofloxacin, but has completed taking Augmentin. She has her Jodie EchevariaKeystone compound with her today. 12/14; the patient has a significant circular wound on the posterior right calf. She is using Keystone and silver alginate under 3 layer compression. Her ABIs were within normal limits at 0.97. This may have been traumatic or an insect bite at the start I reviewed these records. 04/04/2022: Since I last saw the wound, it has contracted considerably. There is a fairly thick layer of slough on the surface, as it has not been debrided since the last time I did it. Edema control is excellent and the periwound skin is in much better  condition. 04/12/2022: No significant change in the wound dimensions. It is filling with granulation tissue. Still with slough accumulation on the surface. Patient History Information obtained from Patient. Family History Cancer - Mother, Diabetes - Father, Hypertension - Mother, Kidney Disease - Mother, Lung Disease - Father, Thyroid Problems - Paternal Grandparents, No family history of Heart Disease, Seizures, Stroke, Tuberculosis. Social History [name], [name] ([id number]) [id number] Page 7 of 11 Never smoker, Marital Status - Married, Alcohol Use - Never, Drug Use - No History, Caffeine Use - Daily - coffee. Medical History Eyes Denies history of Cataracts, Glaucoma, Optic Neuritis Ear/Nose/Mouth/Throat Denies history of Chronic sinus problems/congestion, Middle ear problems Hematologic/Lymphatic Patient has history of Anemia - iron Denies history of Hemophilia, Human Immunodeficiency Virus, Lymphedema, Sickle Cell Disease Respiratory Patient has history of Sleep Apnea - CPAP Denies history of Aspiration, Asthma, Chronic Obstructive Pulmonary Disease (COPD), Pneumothorax, Tuberculosis Cardiovascular Patient has history of Hypertension, Peripheral Venous Disease Denies history of Angina, Arrhythmia, Congestive Heart Failure, Coronary Artery Disease, Deep Vein Thrombosis, Hypotension, Myocardial Infarction, Peripheral Arterial Disease, Phlebitis, Vasculitis Gastrointestinal Denies history of Cirrhosis , Colitis, Crohnoos, Hepatitis A, Hepatitis B, Hepatitis C Endocrine Denies history of Type I Diabetes, Type II Diabetes Genitourinary Denies history of End Stage Renal Disease Immunological Denies history of Lupus Erythematosus, Raynaudoos, Scleroderma Integumentary (Skin) Denies history of History of Burn  Musculoskeletal Denies history of Gout, Rheumatoid Arthritis, Osteoarthritis, Osteomyelitis Neurologic Denies history of Dementia, Neuropathy,  Quadriplegia, Paraplegia, Seizure Disorder Oncologic Denies history of Received Chemotherapy, Received Radiation Psychiatric Denies history of Anorexia/bulimia, Confinement Anxiety Hospitalization/Surgery History - cholecystectomy 1980s. - nephrolithasis 1980s. - plate and rod in right elbow surgery 2010. Medical A Surgical History Notes nd Genitourinary years ago kidney stones Oncologic skin Ca removed from back years ago Objective Constitutional Slightly hypertensive. no acute distress. Vitals Time Taken: 2:50 PM, Temperature: 98.2 F, Pulse: 91 bpm, Respiratory Rate: 18 breaths/min, Blood Pressure: 145/77 mmHg. Respiratory Normal work of breathing on room air. General Notes: 04/12/2022: No significant change in the wound dimensions. It is filling with granulation tissue. Still with slough accumulation on the surface. Integumentary (Hair, Skin) Wound #2 status is Open. Original cause of wound was Insect Bite. The date acquired was: 11/21/2021. The wound has been in treatment 11 weeks. The wound is located on the Right,Posterior Lower Leg. The wound measures 2.6cm length x 3.1cm width x 0.2cm depth; 6.33cm^2 area and 1.266cm^3 volume. There is Fat Layer (Subcutaneous Tissue) exposed. There is no tunneling or undermining noted. There is a medium amount of serosanguineous drainage noted. The wound margin is distinct with the outline attached to the wound base. There is small (1-33%) red, friable granulation within the wound bed. There is a large (67- 100%) amount of necrotic tissue within the wound bed including Adherent Slough. The periwound skin appearance had no abnormalities noted for moisture. The periwound skin appearance exhibited: Scarring, Hemosiderin Staining, Erythema. The periwound skin appearance did not exhibit: Excoriation, Atrophie Blanche, Ecchymosis, Rubor. The surrounding wound skin color is noted with erythema. Periwound temperature was noted as No  Abnormality. Assessment Active Problems ICD-10 Non-pressure chronic ulcer of right calf with fat layer exposed Morbid (severe) obesity due to excess calories Essential (primary) hypertension Localized edema [name], [name] ([id number]) [phone].pdf Page 8 of 11 Procedures Wound #2 Pre-procedure diagnosis of Wound #2 is a Venous Leg Ulcer located on the Right,Posterior Lower Leg .Severity of Tissue Pre Debridement is: Fat layer exposed. There was a Excisional Skin/Subcutaneous Tissue Debridement with a total area of 8.06 sq cm performed by [name], MD. With the following instrument(s): Curette to remove Non-Viable tissue/material. Material removed includes Subcutaneous Tissue and Slough and after achieving pain control using Lidocaine 5% topical ointment. No specimens were taken. A time out was conducted at 15:01, prior to the start of the procedure. A Minimum amount of bleeding was controlled with Pressure. The procedure was tolerated well. Post Debridement Measurements: 2.6cm length x 3.1cm width x 0.2cm depth; 1.266cm^3 volume. Character of Wound/Ulcer Post Debridement is improved. Severity of Tissue Post Debridement is: Fat layer exposed. Post procedure Diagnosis Wound #2: Same as Pre-Procedure General Notes: scribed for Dr. Lady Gary by [name] Bruin, RN. Plan Follow-up Appointments: Return Appointment in 1 week. - Dr. Lady Gary - Room 2 Anesthetic: (In clinic) Topical Lidocaine 5% applied to wound bed Bathing/ Shower/ Hygiene: May shower with protection but do not get wound dressing(s) wet. Protect dressing(s) with water repellant cover (for example, large plastic bag) or a cast cover and may then take shower. Edema Control - Lymphedema / SCD / Other: Avoid standing for long periods of time. Exercise regularly Moisturize legs daily. Compression stocking or Garment 20-30 mm/Hg pressure to: - left leg daily. Apply first thing in the morning,  remove at night. The following medication(s) was prescribed: lidocaine topical 5 % ointment ointment topical was prescribed at facility WOUND #2: -  Lower Leg Wound Laterality: Right, Posterior Cleanser: Soap and Water 1 x Per Week/30 Days Discharge Instructions: May shower and wash wound with dial antibacterial soap and water prior to dressing change. Cleanser: Wound Cleanser 1 x Per Week/30 Days Discharge Instructions: Cleanse the wound with wound cleanser prior to applying a clean dressing using gauze sponges, not tissue or cotton balls. Peri-Wound Care: Triamcinolone 15 (g) 1 x Per Week/30 Days Discharge Instructions: Use triamcinolone 15 (g) as directed Peri-Wound Care: Sween Lotion (Moisturizing lotion) 1 x Per Week/30 Days Discharge Instructions: Apply moisturizing lotion as directed Prim Dressing: Hydrofera Blue Ready Transfer Foam, 2.5x2.5 (in/in) 1 x Per Week/30 Days ary Discharge Instructions: Apply directly to wound bed as directed Secondary Dressing: Woven Gauze Sponge, Non-Sterile 4x4 in 1 x Per Week/30 Days Discharge Instructions: Apply over primary dressing as directed. Secondary Dressing: Zetuvit Plus 4x8 in 1 x Per Week/30 Days Discharge Instructions: Apply over primary dressing as directed. Com pression Wrap: ThreePress (3 layer compression wrap) 1 x Per Week/30 Days Discharge Instructions: Apply three layer compression as directed. Com pression Wrap: Unnaboot w/Calamine, 4x10 (in/yd) 1 x Per Week/30 Days Discharge Instructions: Apply Unnaboot at top of leg 04/12/2022: No significant change in the wound dimensions. It is filling with granulation tissue. Still with slough accumulation on the surface. I used a curette to debride slough and nonviable subcutaneous tissue from the wound. I am going to change her contact layer to [facility] Blue to try and decrease the accumulation of slough as well as the beginnings of hypertrophic granulation tissue that I am starting to see. We  will continue to use her Keystone topical antimicrobial compound and 3 layer compression. She will follow-up in 1 week. Electronic Signature(s) Signed: 04/12/2022 3:18:18 PM By: [name] MD FACS Entered By: [name] on 04/12/2022 15:18:17 -------------------------------------------------------------------------------- HxROS Details Patient Name: Date of Service: [name], [name] 04/12/2022 2:45 PM Medical Record Number: [id number] Patient Account Number: [id number] [name], [name] ([id number]) [id number] Page 9 of 11 Date of Birth/Sex: Treating RN: Jan 13, 1961 (62 y.o. F) Primary Care Provider: Other Clinician: Gerilyn Pilgrim, BA RBA [name] Referring Provider: Treating Provider/Extender: [name] Mom [name], BA RBA [name] Weeks in Treatment: 11 Information Obtained From Patient Eyes Medical History: Negative for: Cataracts; Glaucoma; Optic Neuritis Ear/Nose/Mouth/Throat Medical History: Negative for: Chronic sinus problems/congestion; Middle ear problems Hematologic/Lymphatic Medical History: Positive for: Anemia - iron Negative for: Hemophilia; Human Immunodeficiency Virus; Lymphedema; Sickle Cell Disease Respiratory Medical History: Positive for: Sleep Apnea - CPAP Negative for: Aspiration; Asthma; Chronic Obstructive Pulmonary Disease (COPD); Pneumothorax; Tuberculosis Cardiovascular Medical History: Positive for: Hypertension; Peripheral Venous Disease Negative for: Angina; Arrhythmia; Congestive Heart Failure; Coronary Artery Disease; Deep Vein Thrombosis; Hypotension; Myocardial Infarction; Peripheral Arterial Disease; Phlebitis; Vasculitis Gastrointestinal Medical History: Negative for: Cirrhosis ; Colitis; Crohns; Hepatitis A; Hepatitis B; Hepatitis C Endocrine Medical History: Negative for: Type I Diabetes; Type II Diabetes Genitourinary Medical History: Negative for: End Stage Renal Disease Past Medical History  Notes: years ago kidney stones Immunological Medical History: Negative for: Lupus Erythematosus; Raynauds; Scleroderma Integumentary (Skin) Medical History: Negative for: History of Burn Musculoskeletal Medical History: Negative for: Gout; Rheumatoid Arthritis; Osteoarthritis; Osteomyelitis Neurologic Medical History: Negative for: Dementia; Neuropathy; Quadriplegia; Paraplegia; Seizure Disorder Oncologic Medical History: Negative for: Received Chemotherapy; Received Radiation Past Medical History NotesMarland Kitchen [name], [name] ([id number]) [id number] Page 10 of 11 skin Ca removed from back years ago Psychiatric Medical History: Negative for: Anorexia/bulimia; Confinement Anxiety Immunizations Pneumococcal Vaccine: Received Pneumococcal Vaccination: No Implantable Devices None  Hospitalization / Surgery History Type of Hospitalization/Surgery cholecystectomy 1980s nephrolithasis 1980s plate and rod in right elbow surgery 2010 Family and Social History Cancer: Yes - Mother; Diabetes: Yes - Father; Heart Disease: No; Hypertension: Yes - Mother; Kidney Disease: Yes - Mother; Lung Disease: Yes - Father; Seizures: No; Stroke: No; Thyroid Problems: Yes - Paternal Grandparents; Tuberculosis: No; Never smoker; Marital Status - Married; Alcohol Use: Never; Drug Use: No History; Caffeine Use: Daily - coffee; Financial Concerns: No; Food, Clothing or Shelter Needs: No; Support System Lacking: No; Transportation Concerns: No Electronic Signature(s) Signed: 04/12/2022 3:18:58 PM By: [name] MD FACS Entered By: [name] on 04/12/2022 15:16:47 -------------------------------------------------------------------------------- SuperBill Details Patient Name: Date of Service: [name], [name] 04/12/2022 Medical Record Number: [id number] Patient Account Number: [id number] Date of Birth/Sex: Treating RN: 22-Jul-1960 (62 y.o. F) Primary Care Provider: Gerilyn Pilgrim, BA RBA [name] Other Clinician: Referring Provider: Treating Provider/Extender: [name] Mom [name], BA RBA [name] Weeks in Treatment: 11 Diagnosis Coding ICD-10 Codes Code Description [phone] Non-pressure chronic ulcer of right calf with fat layer exposed E66.01 Morbid (severe) obesity due to excess calories I10 Essential (primary) hypertension R60.0 Localized edema Facility Procedures : CPT4 Code: [id number] Description: 11042 - DEB SUBQ TISSUE 20 SQ CM/< ICD-10 Diagnosis Description L97.212 Non-pressure chronic ulcer of right calf with fat layer exposed Modifier: Quantity: 1 Physician Procedures : CPT4 Code Description Modifier [id number] 99214 - WC PHYS LEVEL 4 - EST PT 25 ICD-10 Diagnosis Description L97.212 Non-pressure chronic ulcer of right calf with fat layer exposed R60.0 Localized edema E66.01 Morbid (severe) obesity due to excess calories  [name], FRECH ([id number]) [id number] Essential (primary) hypertension Quantity: 1 7.pdf Page 11 of 11 : [id number] 11042 - WC PHYS SUBQ TISS 20 SQ CM 1 ICD-10 Diagnosis Description L97.212 Non-pressure chronic ulcer of right calf with fat layer exposed Quantity: Electronic Signature(s) Signed: 04/12/2022 3:18:36 PM By: [name] MD FACS Entered By: [name] on 04/12/2022 15:18:35

## 2022-04-13 NOTE — Progress Notes (Signed)
[name], [name] ([id number]) [id number] Page 1 of 7 Visit Report for 04/12/2022 Arrival Information Details Patient Name: Date of Service: [name], [name] 04/12/2022 2:45 PM Medical Record Number: [id number] Patient Account Number: [id number] Date of Birth/Sex: Treating RN: 17-Apr-1960 (62 y.o. [name] Primary Care [name]: [name], BA RBA RA Other Clinician: Referring [name]: Treating [name]/Extender: [name] EL, BA RBA RA Weeks in Treatment: 11 Visit Information History Since Last Visit Added or deleted any medications: No Patient Arrived: Ambulatory Any new allergies or adverse reactions: No Arrival Time: 14:48 Had a fall or experienced change in No Accompanied By: self activities of daily living that may affect Transfer Assistance: None risk of falls: Patient Identification Verified: Yes Signs or symptoms of abuse/neglect since last visito No Secondary Verification Process Completed: Yes Hospitalized since last visit: No Patient Requires Transmission-Based Precautions: No Implantable device outside of the clinic excluding No Patient Has Alerts: No cellular tissue based products placed in the center since last visit: Has Dressing in Place as Prescribed: Yes Has Compression in Place as Prescribed: Yes Pain Present Now: No Electronic Signature(s) Signed: 04/12/2022 3:48:53 PM By: Samuella Bruin Entered By: Samuella Bruin on 04/12/2022 14:48:54 -------------------------------------------------------------------------------- Encounter Discharge Information Details Patient Name: Date of Service: [address] [name] 04/12/2022 2:45 PM Medical Record Number: [id number] Patient Account Number: [id number] Date of Birth/Sex: Treating RN: 06/27/1960 (62 y.o. [name] Primary Care [name]: [name], [facility] RBA RA Other Clinician: Referring Tressie Ragin: Treating [name]/Extender: [name] EL, BA  RBA RA Weeks in Treatment: 4 Encounter Discharge Information Items Post Procedure Vitals Discharge Condition: Stable Temperature (F): 98.2 Ambulatory Status: Ambulatory Pulse (bpm): 91 Discharge Destination: Home Respiratory Rate (breaths/min): 18 Transportation: Private Auto Blood Pressure (mmHg): 145/77 Accompanied By: self Schedule Follow-up Appointment: Yes Clinical Summary of Care: Patient Declined Electronic Signature(s) Signed: 04/12/2022 3:48:53 PM By: Samuella Bruin Entered By: Samuella Bruin on 04/12/2022 15:21:26 [name] ([id number]) [id number] Page 2 of 7 -------------------------------------------------------------------------------- Lower Extremity Assessment Details Patient Name: Date of Service: [name] 04/12/2022 2:45 PM Medical Record Number: [id number] Patient Account Number: [id number] Date of Birth/Sex: Treating RN: 05-Feb-1961 (62 y.o. [name] Primary Care Jakobi Thetford: [name], [facility] RBA RA Other Clinician: Referring [name]: Treating [name]/Extender: [name] EL, BA RBA RA Weeks in Treatment: 11 Edema Assessment Assessed: [Left: No] [Right: No] [Left: Edema] [Right: :] Calf Left: Right: Point of Measurement: From Medial Instep 42 cm Ankle Left: Right: Point of Measurement: From Medial Instep 24.5 cm Vascular Assessment Pulses: Dorsalis Pedis Palpable: [Right:Yes] Electronic Signature(s) Signed: 04/12/2022 3:48:53 PM By: Samuella Bruin Entered By: Samuella Bruin on 04/12/2022 14:55:30 -------------------------------------------------------------------------------- Multi Wound Chart Details Patient Name: Date of Service: [name], [name] 04/12/2022 2:45 PM Medical Record Number: [id number] Patient Account Number: [id number] Date of Birth/Sex: Treating RN: 02-11-1961 (62 y.o. F) Primary Care Loran Fleet: [name], BA RBA RA Other Clinician: Referring  Shakari Qazi: Treating Justis Dupas/Extender: [name] EL, BA RBA RA Weeks in Treatment: 11 Vital Signs Height(in): Pulse(bpm): 91 Weight(lbs): Blood Pressure(mmHg): 145/77 Body Mass Index(BMI): Temperature(F): 98.2 Respiratory Rate(breaths/min): 18 [2:Photos:] [N/A:N/A] Right, Posterior Lower Leg N/A N/A Wound Location: Insect Bite N/A N/A Wounding Event: Venous Leg Ulcer N/A N/A Primary Etiology: Anemia, Sleep Apnea, Hypertension, N/A N/A Comorbid History: Peripheral Venous Disease 11/21/2021 N/A N/A Date Acquired: 11 N/A N/A Weeks of Treatment: Open N/A N/A Wound Status: No N/A N/A Wound Recurrence: 2.6x3.1x0.2 N/A N/A Measurements L x W x  D (cm) 6.33 N/A N/A A (cm) : rea 1.266 N/A N/A Volume (cm) : -1818.20% N/A N/A % Reduction in A rea: -1178.80% N/A N/A % Reduction in Volume: Full Thickness Without Exposed N/A N/A Classification: Support Structures Medium N/A N/A Exudate A mount: Serosanguineous N/A N/A Exudate Type: red, brown N/A N/A Exudate Color: Distinct, outline attached N/A N/A Wound Margin: Small (1-33%) N/A N/A Granulation A mount: Red, Friable N/A N/A Granulation Quality: Large (67-100%) N/A N/A Necrotic A mount: Fat Layer (Subcutaneous Tissue): Yes N/A N/A Exposed Structures: Fascia: No Tendon: No Muscle: No Joint: No Bone: No None N/A N/A Epithelialization: Debridement - Excisional N/A N/A Debridement: Pre-procedure Verification/Time Out 15:01 N/A N/A Taken: Lidocaine 5% topical ointment N/A N/A Pain Control: Subcutaneous, Slough N/A N/A Tissue Debrided: Skin/Subcutaneous Tissue N/A N/A Level: 8.06 N/A N/A Debridement A (sq cm): rea Curette N/A N/A Instrument: Minimum N/A N/A Bleeding: Pressure N/A N/A Hemostasis A chieved: Procedure was tolerated well N/A N/A Debridement Treatment Response: 2.6x3.1x0.2 N/A N/A Post Debridement Measurements L x W x D (cm) 1.266 N/A N/A Post Debridement Volume:  (cm) Scarring: Yes N/A N/A Periwound Skin Texture: Excoriation: No No Abnormalities Noted N/A N/A Periwound Skin Moisture: Erythema: Yes N/A N/A Periwound Skin Color: Hemosiderin Staining: Yes Atrophie Blanche: No Ecchymosis: No Rubor: No No Abnormality N/A N/A Temperature: Debridement N/A N/A Procedures Performed: Treatment Notes Electronic Signature(s) Signed: 04/12/2022 3:14:41 PM By: [name] MD FACS Entered By: [name] on 04/12/2022 15:14:41 -------------------------------------------------------------------------------- Multi-Disciplinary Care Plan Details Patient Name: Date of Service: [place], [name] 04/12/2022 2:45 PM Medical Record Number: [id number] Patient Account Number: [id number] Date of Birth/Sex: Treating RN: May 24, 1960 (62 y.o. [name] Primary Care Deshea Pooley: [name], [facility] RBA RA Other Clinician: Referring Dajiah Kooi: Treating Kacie Huxtable/Extender: Latrelle Dodrill, BA RBA RA Weeks in Treatment: 11 Multidisciplinary Care Plan reviewed with physician [name], [name] ([id number]) [id number] Page 4 of 7 Active Inactive Necrotic Tissue Nursing Diagnoses: Impaired tissue integrity related to necrotic/devitalized tissue Knowledge deficit related to management of necrotic/devitalized tissue Goals: Necrotic/devitalized tissue will be minimized in the wound bed Date Initiated: 01/23/2022 Target Resolution Date: 06/06/2022 Goal Status: Active Patient/caregiver will verbalize understanding of reason and process for debridement of necrotic tissue Date Initiated: 01/23/2022 Target Resolution Date: 06/06/2022 Goal Status: Active Interventions: Assess patient pain level pre-, during and post procedure and prior to discharge Provide education on necrotic tissue and debridement process Treatment Activities: Apply topical anesthetic as ordered : 01/23/2022 Notes: Wound/Skin Impairment Nursing  Diagnoses: Impaired tissue integrity Knowledge deficit related to ulceration/compromised skin integrity Goals: Patient/caregiver will verbalize understanding of skin care regimen Date Initiated: 01/23/2022 Target Resolution Date: 06/06/2022 Goal Status: Active Interventions: Assess patient/caregiver ability to obtain necessary supplies Assess ulceration(s) every visit Treatment Activities: Skin care regimen initiated : 01/23/2022 Topical wound management initiated : 01/23/2022 Notes: Electronic Signature(s) Signed: 04/12/2022 3:48:53 PM By: Samuella Bruin Entered By: Samuella Bruin on 04/12/2022 15:01:05 -------------------------------------------------------------------------------- Pain Assessment Details Patient Name: Date of Service: [name], [name] 04/12/2022 2:45 PM Medical Record Number: [id number] Patient Account Number: [id number] Date of Birth/Sex: Treating RN: 04/29/1960 (62 y.o. [name] Primary Care Maevis Mumby: [name], [facility] RBA RA Other Clinician: Referring Taren Dymek: Treating [name]: [name] EL, BA RBA RA Weeks in Treatment: 11 Active Problems Location of Pain Severity and Description of Pain Patient Has Paino No Site Locations Rate the pain. [name], [name] ([id number]) [id number] Page 5 of 7 Rate the pain. Current Pain Level: 0 Pain  Management and Medication Current Pain Management: Electronic Signature(s) Signed: 04/12/2022 3:48:53 PM By: Adline Peals Entered By: Adline Peals on 04/12/2022 14:49:03 -------------------------------------------------------------------------------- Patient/Caregiver Education Details Patient Name: Date of Service: [name], [name] 1/5/2024andnbsp2:45 PM Medical Record Number: [id number] Patient Account Number: [id number] Date of Birth/Gender: Treating RN: [birth year] (62 y.o. [name] Primary Care Physician: Cecille Po, [facility] RBA RA  Other Clinician: Referring Physician: Treating Physician/Extender: Bettey Mare EL, BA RBA RA Weeks in Treatment: 11 Education Assessment Education Provided To: Patient Education Topics Provided Wound/Skin Impairment: Methods: Explain/Verbal Responses: Reinforcements needed, State content correctly Electronic Signature(s) Signed: 04/12/2022 3:48:53 PM By: Adline Peals Entered By: Adline Peals on 04/12/2022 15:01:17 -------------------------------------------------------------------------------- Wound Assessment Details Patient Name: Date of Service: [name], [name] 04/12/2022 2:45 PM Medical Record Number: [id number] Patient Account Number: [id number] Date of Birth/Sex: Treating RN: Jun 07, 1960 (62 y.o. [name] Primary Care Zikeria Keough: Cecille Po, [facility] RBA RA Other Clinician: Referring [name]: Treating [name]: Margarita Grizzle, [place] RBA RA KAYLENA, PACIFICO ([id number]) [id number] Page 6 of 7 Weeks in Treatment: 11 Wound Status Wound Number: 2 Primary Venous Leg Ulcer Etiology: Wound Location: Right, Posterior Lower Leg Wound Status: Open Wounding Event: Insect Bite Comorbid Anemia, Sleep Apnea, Hypertension, Peripheral Venous Date Acquired: 11/21/2021 History: Disease Weeks Of Treatment: 11 Clustered Wound: No Photos Wound Measurements Length: (cm) 2.6 Width: (cm) 3.1 Depth: (cm) 0.2 Area: (cm) 6.33 Volume: (cm) 1.266 % Reduction in Area: -1818.2% % Reduction in Volume: -1178.8% Epithelialization: None Tunneling: No Undermining: No Wound Description Classification: Full Thickness Without Exposed Support Structures Wound Margin: Distinct, outline attached Exudate Amount: Medium Exudate Type: Serosanguineous Exudate Color: red, brown Foul Odor After Cleansing: No Slough/Fibrino Yes Wound Bed Granulation Amount: Small (1-33%) Exposed Structure Granulation Quality: Red, Friable Fascia  Exposed: No Necrotic Amount: Large (67-100%) Fat Layer (Subcutaneous Tissue) Exposed: Yes Necrotic Quality: Adherent Slough Tendon Exposed: No Muscle Exposed: No Joint Exposed: No Bone Exposed: No Periwound Skin Texture Texture Color No Abnormalities Noted: No No Abnormalities Noted: No Excoriation: No Atrophie Blanche: No Scarring: Yes Ecchymosis: No Erythema: Yes Moisture Hemosiderin Staining: Yes No Abnormalities Noted: Yes Rubor: No Temperature / Pain Temperature: No Abnormality Treatment Notes Wound #2 (Lower Leg) Wound Laterality: Right, Posterior Cleanser Soap and Water Discharge Instruction: May shower and wash wound with dial antibacterial soap and water prior to dressing change. Wound Cleanser Discharge Instruction: Cleanse the wound with wound cleanser prior to applying a clean dressing using gauze sponges, not tissue or cotton balls. Peri-Wound Care Triamcinolone 15 (g) Discharge Instruction: Use triamcinolone 15 (g) as directed Sween Lotion (Moisturizing lotion) [name], RIDEN ([id number]) [id number] Page 7 of 7 Discharge Instruction: Apply moisturizing lotion as directed Topical Primary Dressing Hydrofera Blue Ready Transfer Foam, 2.5x2.5 (in/in) Discharge Instruction: Apply directly to wound bed as directed Secondary Dressing Woven Gauze Sponge, Non-Sterile 4x4 in Discharge Instruction: Apply over primary dressing as directed. Zetuvit Plus 4x8 in Discharge Instruction: Apply over primary dressing as directed. Secured With Compression Wrap ThreePress (3 layer compression wrap) Discharge Instruction: Apply three layer compression as directed. Unnaboot w/Calamine, 4x10 (in/yd) Discharge Instruction: Apply Unnaboot at top of leg Compression Stockings Add-Ons Electronic Signature(s) Signed: 04/12/2022 3:48:53 PM By: Adline Peals Entered By: Adline Peals on 04/12/2022  14:57:20 -------------------------------------------------------------------------------- Vitals Details Patient Name: Date of Service: [name], [name] 04/12/2022 2:45 PM Medical Record Number: [id number] Patient Account Number: [id number] Date of Birth/Sex: Treating RN: [birth year] (62 y.o. [name] Primary Care Temesha Queener: Milly Jakob  EL, BA RBA RA Other Clinician: Referring Josiah Wojtaszek: Treating Mithra Spano/Extender: [name] EL, BA RBA RA Weeks in Treatment: 11 Vital Signs Time Taken: 14:50 Temperature (F): 98.2 Pulse (bpm): 91 Respiratory Rate (breaths/min): 18 Blood Pressure (mmHg): 145/77 Reference Range: 80 - 120 mg / dl Electronic Signature(s) Signed: 04/12/2022 3:48:53 PM By: Samuella Bruin Entered By: Samuella Bruin on 04/12/2022 14:50:37

## 2022-04-15 ENCOUNTER — Other Ambulatory Visit: Payer: BC Managed Care – PPO

## 2022-04-15 ENCOUNTER — Telehealth: Payer: Self-pay | Admitting: Family Medicine

## 2022-04-15 DIAGNOSIS — E039 Hypothyroidism, unspecified: Secondary | ICD-10-CM

## 2022-04-15 DIAGNOSIS — M545 Low back pain, unspecified: Secondary | ICD-10-CM

## 2022-04-15 NOTE — Telephone Encounter (Signed)
Pt need a Rx Refill for lidocaine (XYLOCAINE) 5 % ointment sent to   [facility], [address] Phone: [phone]  Fax: [phone]     Please advise

## 2022-04-16 LAB — TSH: TSH: 2.72 u[IU]/mL (ref 0.35–5.50)

## 2022-04-16 MED ORDER — LIDOCAINE 5 % EX OINT: 1.0000 | TOPICAL_OINTMENT | CUTANEOUS | 0 refills | Status: DC | PRN

## 2022-04-16 NOTE — Telephone Encounter (Signed)
Ok to refill 

## 2022-04-16 NOTE — Telephone Encounter (Signed)
Rx done. 

## 2022-04-19 ENCOUNTER — Encounter (HOSPITAL_BASED_OUTPATIENT_CLINIC_OR_DEPARTMENT_OTHER): Payer: BC Managed Care – PPO | Admitting: General Surgery

## 2022-04-19 DIAGNOSIS — L97212 Non-pressure chronic ulcer of right calf with fat layer exposed: Secondary | ICD-10-CM | POA: Diagnosis not present

## 2022-04-19 NOTE — Progress Notes (Addendum)
[name], [name] ([id number]) [id number] Page 1 of 11 Visit Report for 04/19/2022 Chief Complaint Document Details Patient Name: Date of Service: [name] 04/19/2022 2:45 PM Medical Record Number: [id number] Patient Account Number: [id number] Date of Birth/Sex: Treating RN: Jul 09, 1960 (62 y.o. F) Primary Care Provider: Gerilyn Pilgrim, [facility] RBA RA Other Clinician: Referring Provider: Treating Provider/Extender: Renelda Mom EL, BA RBA RA Weeks in Treatment: 12 Information Obtained from: Patient Chief Complaint RLE ulcer Electronic Signature(s) Signed: 04/19/2022 3:35:23 PM By: [name] MD FACS Entered By: [name] on 04/19/2022 15:35:23 -------------------------------------------------------------------------------- Debridement Details Patient Name: Date of Service: [name], [name] 04/19/2022 2:45 PM Medical Record Number: [id number] Patient Account Number: [id number] Date of Birth/Sex: Treating RN: 26-Nov-1960 (62 y.o. F) Zochol, [name] Primary Care Provider: Gerilyn Pilgrim, BA RBA RA Other Clinician: Referring Provider: Treating Provider/Extender: Renelda Mom EL, BA RBA RA Weeks in Treatment: 12 Debridement Performed for Assessment: Wound #2 Right,Posterior Lower Leg Performed By: Physician [name], MD Debridement Type: Debridement Severity of Tissue Pre Debridement: Fat layer exposed Level of Consciousness (Pre-procedure): Awake and Alert Pre-procedure Verification/Time Out Yes - 15:20 Taken: Start Time: 15:20 Pain Control: Lidocaine 5% topical ointment T Area Debrided (L x W): otal 2.3 (cm) x 2.3 (cm) = 5.29 (cm) Tissue and other material debrided: Viable, Non-Viable, Slough, Subcutaneous, Slough Level: Skin/Subcutaneous Tissue Debridement Description: Excisional Instrument: Curette Bleeding: Minimum Hemostasis Achieved: Pressure Response to Treatment: Procedure was tolerated well Level of  Consciousness (Post- Awake and Alert procedure): Post Debridement Measurements of Total Wound Length: (cm) 2.3 Width: (cm) 2.3 Depth: (cm) 0.2 Volume: (cm) 0.831 Character of Wound/Ulcer Post Debridement: Requires Further Debridement Severity of Tissue Post Debridement: Fat layer exposed Post Procedure Diagnosis Same as SALINDA, SNEDEKER ([id number]) [id number] Page 2 of 11 Notes Scribed for Dr. Lady Gary by Tommie Ard, RN Electronic Signature(s) Signed: 04/19/2022 3:49:23 PM By: Tommie Ard RN Signed: 04/19/2022 4:10:49 PM By: [name] MD FACS Entered By: Tommie Ard on 04/19/2022 15:49:23 -------------------------------------------------------------------------------- HPI Details Patient Name: Date of Service: [name], [name] 04/19/2022 2:45 PM Medical Record Number: [id number] Patient Account Number: [id number] Date of Birth/Sex: Treating RN: 08/12/1960 (62 y.o. F) Primary Care Provider: Gerilyn Pilgrim, [facility] RBA RA Other Clinician: Referring Provider: Treating Provider/Extender: Renelda Mom EL, BA RBA RA Weeks in Treatment: 12 History of Present Illness HPI Description: 02/16/2020 upon evaluation today patient actually appears to be doing poorly in regard to her left medial lower extremity ulcer. This is actually an area that she tells me she has had intermittent issues with over the years although has been closed for some time she typically uses compression right now she has juxta lite compression wraps. With that being said she tells me that this nonetheless open several weeks/months ago and has been given her trouble since. She does have a history of chronic venous insufficiency she is seeing specialist for this in the past she has had an ablation as well as sclerotherapy. With that being said she also has hypertension chronically which is managed by her primary care provider. In general she seems to be worsening  overall with regard to the wound and states that she finally realized that she needed to come in and have somebody look at this and not continue to try to manage this on her own. No fevers, chills, nausea, vomiting, or diarrhea. 02/23/2020 on evaluation today patient appears to be doing well with regard to her wound. This is showing some  signs of improvement which is great news still were not quite at the point where I would like to be as far as the overall appearance of the wound is concerned but I do believe this is better than last week. I do believe the Iodoflex is helping as well. 03/08/2020 upon evaluation today patient appears to be doing well with regard to her wound. She has been tolerating the dressing changes without complication. Fortunately I feel like she has made great progress with the Iodoflex but I feel like it may be the point rest to switch to something else possibly a collagen- based dressing at this time. 03/15/2020 upon evaluation today patient appears to be doing excellent in regard to her leg ulcer. She has been tolerating the dressing changes without complication. Fortunately there is no signs of active infection. Overall she is measuring a little bit smaller today which is great news. 03/22/2020 upon evaluation today patient appears to be doing well with regard to her wound. She has been tolerating the dressing changes without complication. Fortunately there is no signs of active infection at this time. 03/28/2020; patient I do not usually see however she has a wound on the left anterior lower leg secondary to chronic venous insufficiency we have been using silver collagen under compression. She arrives in clinic with a nonviable surface requiring debridement 04/12/2020 upon evaluation today patient appears to be doing well all things considered with regard to her leg ulcer. She is tolerating the dressing changes without complication there is minimal dry skin around the edges of  the wound that may be trapping and stopping some of the events of the new skin I am can work on that today. Otherwise the surface of the wound appears to be doing excellent. 04/19/2020 upon evaluation today patient appears to be doing well with regard to her leg ulcer. She has been tolerating dressing changes without complication. Fortunately there is no signs of active infection at this time. No fever chills noted overall very pleased with how things seem to be progressing. 04/26/2020 on evaluation today patient appears to be doing well with regard to her wound currently. Is showing signs of excellent improvement overall is filling in nicely and there does not appear to be any signs of infection. No fevers, chills, nausea, vomiting, or diarrhea. 05/03/2020 upon evaluation today patient appears to be doing well with regard to her wound on the leg. This overall showing signs of good improvement which is great she has some good epithelial growth and overall I think that things are moving in the correct direction. We likewise going to continue with the wound care measures as before since she seems to making such good improvement. 2/3; venous wound on the left medial leg. This is contracting. We are using Prisma and 3 layer compression. She has a stocking and waiting in the eventuality this heals. She is already using it on the right 05/17/2020 upon evaluation today patient appears to be doing well with regard to her leg ulcer. She has been tolerating the dressing changes without complication. Fortunately there is no signs of active infection which is great news and overall very pleased with where things stand today. No fevers, chills, nausea, vomiting, or diarrhea. 05/24/2020 upon evaluation today patient appears to be doing well with regard to her wound. Overall I feel like she is making excellent progress. There does not appear to be any signs of active infection which is great news. 05/31/2020 upon  evaluation today patient appears to  be doing well with regard to her wound. There does not appear to be any signs of active infection which is great news overall I am extremely pleased with where things stand today. READMISSION 01/23/2022 She returns to clinic today with a new wound on her right posterior calf. She says that she was cleaning out an old shed near the middle of August this year and then noticed what seemed to be a bug bite on her right posterior calf. It was itchy, red, and raised. By the end of September, an ulcer had developed. She has been applying various topical creams such as hydrocortisone and others to the site. When it was not improving, she made an appointment in the wound care LATEKA, RADY ([id number]) [phone].pdf Page 3 of 11 center. ABI in clinic today was 0.97. On her right posterior calf, there is a circular wound with necrotic fat and black eschar present. There is no purulent drainage or malodor. The periwound skin is in good condition with just a little induration that appears to be secondary to inflammation. 01/31/2022: The wound measures a little bit larger today, but overall is quite a bit cleaner. There is some undermining from 9:00 to 3:00. She is having some periwound itching and says that her wrap slid. 02/08/2022: Despite using an Unna boot first layer at the top of her wrap, it slid again and it looks like there is been some bruising at the wound site. The wound is about the same size in terms of dimensions. There is a fair amount of slough and nonviable tissue still present. Edema control is better than last week. 02/15/2022: The wound dimensions are about the same. There is less nonviable tissue present. The periwound erythema has improved. 02/22/2022: The wound has deteriorated over the past week. It is larger and the periwound is more edematous, erythematous, and indurated. It looks as though there has been more tissue  breakdown with undermining present. She is having more pain. There is a foul odor coming from the wound. 02/26/2022: The wound looks quite a bit better today and the odor is gone. I still do not have her culture data back, but she seems to be responding well to the Augmentin. 03/06/2022: Her wound continues to improve. There is still some slough accumulation, but the periwound skin is less inflamed. Her culture returned with a polymicrobial population including Pseudomonas and so levofloxacin was also prescribed. She did not understand why a second antibiotic was being added so she has not yet initiated this. 03/14/2022: The wound is about the same, to perhaps a little bit larger. There is a fair amount of slough accumulation on the surface, as well as some hypertrophic granulation tissue. She is still taking levofloxacin, but has completed taking Augmentin. She has her Jodie Echevaria compound with her today. 12/14; the patient has a significant circular wound on the posterior right calf. She is using Keystone and silver alginate under 3 layer compression. Her ABIs were within normal limits at 0.97. This may have been traumatic or an insect bite at the start I reviewed these records. 04/04/2022: Since I last saw the wound, it has contracted considerably. There is a fairly thick layer of slough on the surface, as it has not been debrided since the last time I did it. Edema control is excellent and the periwound skin is in much better condition. 04/12/2022: No significant change in the wound dimensions. It is filling with granulation tissue. Still with slough accumulation on the surface. 04/19/2022: The  wound is smaller this week. There is some slough accumulation on the surface. Electronic Signature(s) Signed: 04/19/2022 3:36:02 PM By: Duanne Guessannon, Tres Grzywacz MD FACS Entered By: Duanne Guessannon, Phylliss Strege on 04/19/2022 15:36:02 -------------------------------------------------------------------------------- Physical Exam  Details Patient Name: Date of Service: [name] [name], [name] 04/19/2022 2:45 PM Medical Record Number: [id number] Patient Account Number: [id number] Date of Birth/Sex: Treating RN: 05/17/1960 (62 y.o. F) Primary Care Provider: Gerilyn PilgrimMICHA EL, BA RBA RA Other Clinician: Referring Provider: Treating Provider/Extender: Renelda Momannon, Ayaka Andes MICHA EL, BA RBA RA Weeks in Treatment: 12 Constitutional Hypertensive, asymptomatic. Slightly tachycardic. . . no acute distress. Respiratory Normal work of breathing on room air. Notes 04/19/2022: The wound is smaller this week. There is some slough accumulation on the surface. Electronic Signature(s) Signed: 04/19/2022 3:37:46 PM By: Duanne Guessannon, Tasman Zapata MD FACS Entered By: Duanne Guessannon, Cashton Hosley on 04/19/2022 15:37:46 -------------------------------------------------------------------------------- Physician Orders Details Patient Name: Date of Service: [name] [name], [name] 04/19/2022 2:45 PM Medical Record Number: [id number] Patient Account Number: [id number] Hilton CorkWATKINS, Lavoris H ([id number]) [id number] Page 4 of 11 Date of Birth/Sex: Treating RN: 09/29/1960 (62 y.o. F) Zochol, [name] Primary Care Provider: Other Clinician: Gerilyn PilgrimMICHA EL, BA RBA RA Referring Provider: Treating Provider/Extender: Renelda Momannon, Devansh Riese MICHA EL, BA RBA RA Weeks in Treatment: 5012 Verbal / Phone Orders: No Diagnosis Coding ICD-10 Coding Code Description [phone] Non-pressure chronic ulcer of right calf with fat layer exposed E66.01 Morbid (severe) obesity due to excess calories I10 Essential (primary) hypertension R60.0 Localized edema Follow-up Appointments ppointment in 1 week. - Dr. Lady Garyannon - Room 2 Return A Anesthetic (In clinic) Topical Lidocaine 5% applied to wound bed Bathing/ Shower/ Hygiene May shower with protection but do not get wound dressing(s) wet. Protect dressing(s) with water repellant cover (for example, large plastic bag) or a cast cover and may then  take shower. Edema Control - Lymphedema / SCD / Other Bilateral Lower Extremities Avoid standing for long periods of time. Exercise regularly Moisturize legs daily. Compression stocking or Garment 20-30 mm/Hg pressure to: - left leg daily. Apply first thing in the morning, remove at night. Wound Treatment Wound #2 - Lower Leg Wound Laterality: Right, Posterior Cleanser: Soap and Water 1 x Per Week/30 Days Discharge Instructions: May shower and wash wound with dial antibacterial soap and water prior to dressing change. Cleanser: Wound Cleanser 1 x Per Week/30 Days Discharge Instructions: Cleanse the wound with wound cleanser prior to applying a clean dressing using gauze sponges, not tissue or cotton balls. Peri-Wound Care: Triamcinolone 15 (g) 1 x Per Week/30 Days Discharge Instructions: Use triamcinolone 15 (g) as directed Peri-Wound Care: Sween Lotion (Moisturizing lotion) 1 x Per Week/30 Days Discharge Instructions: Apply moisturizing lotion as directed Prim Dressing: Hydrofera Blue Ready Transfer Foam, 2.5x2.5 (in/in) 1 x Per Week/30 Days ary Discharge Instructions: Apply directly to wound bed as directed Secondary Dressing: Woven Gauze Sponge, Non-Sterile 4x4 in 1 x Per Week/30 Days Discharge Instructions: Apply over primary dressing as directed. Secondary Dressing: Zetuvit Plus 4x8 in 1 x Per Week/30 Days Discharge Instructions: Apply over primary dressing as directed. Compression Wrap: ThreePress (3 layer compression wrap) 1 x Per Week/30 Days Discharge Instructions: Apply three layer compression as directed. Compression Wrap: Unnaboot w/Calamine, 4x10 (in/yd) 1 x Per Week/30 Days Discharge Instructions: Apply Unnaboot at top of leg Electronic Signature(s) Signed: 04/19/2022 3:50:02 PM By: Tommie ArdZochol, [name] RN Signed: 04/19/2022 4:10:49 PM By: Duanne Guessannon, Nya Monds MD FACS Entered By: Tommie ArdZochol, [name] on 04/19/2022 15:50:01 Hilton CorkWATKINS, Roman H ([id number])  [id number] Page 5 of 11 -------------------------------------------------------------------------------- Problem List Details Patient  Name: Date of Service: [name] 04/19/2022 2:45 PM Medical Record Number: [id number] Patient Account Number: [id number] Date of Birth/Sex: Treating RN: May 10, 1960 (62 y.o. F) Primary Care Provider: Cecille Po, [facility] RBA RA Other Clinician: Referring Provider: Treating Provider/Extender: Bettey Mare EL, BA RBA RA Weeks in Treatment: 12 Active Problems ICD-10 Encounter Code Description Active Date MDM Diagnosis L97.212 Non-pressure chronic ulcer of right calf with fat layer exposed 01/23/2022 No Yes E66.01 Morbid (severe) obesity due to excess calories 01/23/2022 No Yes I10 Essential (primary) hypertension 01/23/2022 No Yes R60.0 Localized edema 01/23/2022 No Yes Inactive Problems Resolved Problems Electronic Signature(s) Signed: 04/19/2022 3:35:10 PM By: Fredirick Maudlin MD FACS Entered By: Fredirick Maudlin on 04/19/2022 15:35:09 -------------------------------------------------------------------------------- Progress Note Details Patient Name: Date of Service: [name], [name] 04/19/2022 2:45 PM Medical Record Number: [id number] Patient Account Number: [id number] Date of Birth/Sex: Treating RN: [birth year] (62 y.o. F) Primary Care Provider: Cecille Po, [facility] RBA RA Other Clinician: Referring Provider: Treating Provider/Extender: Bettey Mare EL, BA RBA RA Weeks in Treatment: 12 Subjective Chief Complaint Information obtained from Patient RLE ulcer History of Present Illness (HPI) 02/16/2020 upon evaluation today patient actually appears to be doing poorly in regard to her left medial lower extremity ulcer. This is actually an area that she tells me she has had intermittent issues with over the years although has been closed for some time she typically uses compression right now she has  juxta lite compression wraps. With that being said she tells me that this nonetheless open several weeks/months ago and has been given her trouble since. She does have a history of chronic venous insufficiency she is seeing specialist for this in the past she has had an ablation as well as sclerotherapy. With that being said she also has hypertension chronically which is managed by her primary care provider. In general she seems to be worsening overall with regard to the wound and states that she finally realized that she needed to come in and have somebody look at this and not continue to try to manage this on her own. No fevers, chills, nausea, vomiting, or diarrhea. 02/23/2020 on evaluation today patient appears to be doing well with regard to her wound. This is showing some signs of improvement which is great news still were not quite at the point where I would like to be as far as the overall appearance of the wound is concerned but I do believe this is better than last week. I do believe the Iodoflex is helping as well. TRENISE, TURAY ([id number]) [id number] Page 6 of 11 03/08/2020 upon evaluation today patient appears to be doing well with regard to her wound. She has been tolerating the dressing changes without complication. Fortunately I feel like she has made great progress with the Iodoflex but I feel like it may be the point rest to switch to something else possibly a collagen- based dressing at this time. 03/15/2020 upon evaluation today patient appears to be doing excellent in regard to her leg ulcer. She has been tolerating the dressing changes without complication. Fortunately there is no signs of active infection. Overall she is measuring a little bit smaller today which is great news. 03/22/2020 upon evaluation today patient appears to be doing well with regard to her wound. She has been tolerating the dressing changes without complication. Fortunately  there is no signs of active infection at this time. 03/28/2020; patient I do not usually see however she has a wound  on the left anterior lower leg secondary to chronic venous insufficiency we have been using silver collagen under compression. She arrives in clinic with a nonviable surface requiring debridement 04/12/2020 upon evaluation today patient appears to be doing well all things considered with regard to her leg ulcer. She is tolerating the dressing changes without complication there is minimal dry skin around the edges of the wound that may be trapping and stopping some of the events of the new skin I am can work on that today. Otherwise the surface of the wound appears to be doing excellent. 04/19/2020 upon evaluation today patient appears to be doing well with regard to her leg ulcer. She has been tolerating dressing changes without complication. Fortunately there is no signs of active infection at this time. No fever chills noted overall very pleased with how things seem to be progressing. 04/26/2020 on evaluation today patient appears to be doing well with regard to her wound currently. Is showing signs of excellent improvement overall is filling in nicely and there does not appear to be any signs of infection. No fevers, chills, nausea, vomiting, or diarrhea. 05/03/2020 upon evaluation today patient appears to be doing well with regard to her wound on the leg. This overall showing signs of good improvement which is great she has some good epithelial growth and overall I think that things are moving in the correct direction. We likewise going to continue with the wound care measures as before since she seems to making such good improvement. 2/3; venous wound on the left medial leg. This is contracting. We are using Prisma and 3 layer compression. She has a stocking and waiting in the eventuality this heals. She is already using it on the right 05/17/2020 upon evaluation today patient appears to  be doing well with regard to her leg ulcer. She has been tolerating the dressing changes without complication. Fortunately there is no signs of active infection which is great news and overall very pleased with where things stand today. No fevers, chills, nausea, vomiting, or diarrhea. 05/24/2020 upon evaluation today patient appears to be doing well with regard to her wound. Overall I feel like she is making excellent progress. There does not appear to be any signs of active infection which is great news. 05/31/2020 upon evaluation today patient appears to be doing well with regard to her wound. There does not appear to be any signs of active infection which is great news overall I am extremely pleased with where things stand today. READMISSION 01/23/2022 She returns to clinic today with a new wound on her right posterior calf. She says that she was cleaning out an old shed near the middle of August this year and then noticed what seemed to be a bug bite on her right posterior calf. It was itchy, red, and raised. By the end of September, an ulcer had developed. She has been applying various topical creams such as hydrocortisone and others to the site. When it was not improving, she made an appointment in the wound care center. ABI in clinic today was 0.97. On her right posterior calf, there is a circular wound with necrotic fat and black eschar present. There is no purulent drainage or malodor. The periwound skin is in good condition with just a little induration that appears to be secondary to inflammation. 01/31/2022: The wound measures a little bit larger today, but overall is quite a bit cleaner. There is some undermining from 9:00 to 3:00. She is having some periwound  itching and says that her wrap slid. 02/08/2022: Despite using an Unna boot first layer at the top of her wrap, it slid again and it looks like there is been some bruising at the wound site. The wound is about the same size in terms  of dimensions. There is a fair amount of slough and nonviable tissue still present. Edema control is better than last week. 02/15/2022: The wound dimensions are about the same. There is less nonviable tissue present. The periwound erythema has improved. 02/22/2022: The wound has deteriorated over the past week. It is larger and the periwound is more edematous, erythematous, and indurated. It looks as though there has been more tissue breakdown with undermining present. She is having more pain. There is a foul odor coming from the wound. 02/26/2022: The wound looks quite a bit better today and the odor is gone. I still do not have her culture data back, but she seems to be responding well to the Augmentin. 03/06/2022: Her wound continues to improve. There is still some slough accumulation, but the periwound skin is less inflamed. Her culture returned with a polymicrobial population including Pseudomonas and so levofloxacin was also prescribed. She did not understand why a second antibiotic was being added so she has not yet initiated this. 03/14/2022: The wound is about the same, to perhaps a little bit larger. There is a fair amount of slough accumulation on the surface, as well as some hypertrophic granulation tissue. She is still taking levofloxacin, but has completed taking Augmentin. She has her Jodie Echevaria compound with her today. 12/14; the patient has a significant circular wound on the posterior right calf. She is using Keystone and silver alginate under 3 layer compression. Her ABIs were within normal limits at 0.97. This may have been traumatic or an insect bite at the start I reviewed these records. 04/04/2022: Since I last saw the wound, it has contracted considerably. There is a fairly thick layer of slough on the surface, as it has not been debrided since the last time I did it. Edema control is excellent and the periwound skin is in much better condition. 04/12/2022: No significant change in  the wound dimensions. It is filling with granulation tissue. Still with slough accumulation on the surface. 04/19/2022: The wound is smaller this week. There is some slough accumulation on the surface. Patient History Information obtained from Patient. Family History Cancer - Mother, Diabetes - Father, Hypertension - Mother, Kidney Disease - Mother, Lung Disease - Father, Thyroid Problems - Paternal Grandparents, No family history of Heart Disease, Seizures, Stroke, Tuberculosis. Social History Never smoker, Marital Status - Married, Alcohol Use - Never, Drug Use - No History, Caffeine Use - Daily - coffee. Medical History Eyes Denies history of Cataracts, Glaucoma, Optic Neuritis Ear/Nose/Mouth/Throat CHAVIE, KOLINSKI ([id number]) [id number] Page 7 of 11 Denies history of Chronic sinus problems/congestion, Middle ear problems Hematologic/Lymphatic Patient has history of Anemia - iron Denies history of Hemophilia, Human Immunodeficiency Virus, Lymphedema, Sickle Cell Disease Respiratory Patient has history of Sleep Apnea - CPAP Denies history of Aspiration, Asthma, Chronic Obstructive Pulmonary Disease (COPD), Pneumothorax, Tuberculosis Cardiovascular Patient has history of Hypertension, Peripheral Venous Disease Denies history of Angina, Arrhythmia, Congestive Heart Failure, Coronary Artery Disease, Deep Vein Thrombosis, Hypotension, Myocardial Infarction, Peripheral Arterial Disease, Phlebitis, Vasculitis Gastrointestinal Denies history of Cirrhosis , Colitis, Crohnoos, Hepatitis A, Hepatitis B, Hepatitis C Endocrine Denies history of Type I Diabetes, Type II Diabetes Genitourinary Denies history of End Stage Renal Disease Immunological Denies history of  Lupus Erythematosus, Raynaudoos, Scleroderma Integumentary (Skin) Denies history of History of Burn Musculoskeletal Denies history of Gout, Rheumatoid Arthritis, Osteoarthritis,  Osteomyelitis Neurologic Denies history of Dementia, Neuropathy, Quadriplegia, Paraplegia, Seizure Disorder Oncologic Denies history of Received Chemotherapy, Received Radiation Psychiatric Denies history of Anorexia/bulimia, Confinement Anxiety Hospitalization/Surgery History - cholecystectomy 1980s. - nephrolithasis 1980s. - plate and rod in right elbow surgery 2010. 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 Slightly tachycardic. no acute distress. Vitals Time Taken: 3:00 PM, Temperature: 98.1 F, Pulse: 105 bpm, Respiratory Rate: 18 breaths/min, Blood Pressure: 173/81 mmHg. Respiratory Normal work of breathing on room air. General Notes: 04/19/2022: The wound is smaller this week. There is some slough accumulation on the surface. Integumentary (Hair, Skin) Wound #2 status is Open. Original cause of wound was Insect Bite. The date acquired was: 11/21/2021. The wound has been in treatment 12 weeks. The wound is located on the Right,Posterior Lower Leg. The wound measures 2.3cm length x 2.3cm width x 0.2cm depth; 4.155cm^2 area and 0.831cm^3 volume. There is Fat Layer (Subcutaneous Tissue) exposed. There is no tunneling or undermining noted. There is a medium amount of serosanguineous drainage noted. The wound margin is distinct with the outline attached to the wound base. There is small (1-33%) red, friable granulation within the wound bed. There is a large (67- 100%) amount of necrotic tissue within the wound bed including Adherent Slough. The periwound skin appearance had no abnormalities noted for moisture. The periwound skin appearance exhibited: Scarring, Hemosiderin Staining, Erythema. The periwound skin appearance did not exhibit: Excoriation, Atrophie Blanche, Ecchymosis, Rubor. The surrounding wound skin color is noted with erythema. Periwound  temperature was noted as No Abnormality. Assessment Active Problems ICD-10 Non-pressure chronic ulcer of right calf with fat layer exposed Morbid (severe) obesity due to excess calories Essential (primary) hypertension Localized edema Procedures YARITSA, SAVARINO ([id number]) [phone].pdf Page 8 of 11 Wound #2 Pre-procedure diagnosis of Wound #2 is a Venous Leg Ulcer located on the Right,Posterior Lower Leg .Severity of Tissue Pre Debridement is: Fat layer exposed. There was a Excisional Skin/Subcutaneous Tissue Debridement with a total area of 5.29 sq cm performed by Fredirick Maudlin, MD. With the following instrument(s): Curette to remove Viable and Non-Viable tissue/material. Material removed includes Subcutaneous Tissue and Slough and after achieving pain control using Lidocaine 5% topical ointment. A time out was conducted at 15:20, prior to the start of the procedure. A Minimum amount of bleeding was controlled with Pressure. The procedure was tolerated well. Post Debridement Measurements: 2.3cm length x 2.3cm width x 0.2cm depth; 0.831cm^3 volume. Character of Wound/Ulcer Post Debridement requires further debridement. Severity of Tissue Post Debridement is: Fat layer exposed. Post procedure Diagnosis Wound #2: Same as Pre-Procedure General Notes: Scribed for Dr. Celine Ahr by [name], RN. Pre-procedure diagnosis of Wound #2 is a Venous Leg Ulcer located on the Right,Posterior Lower Leg . There was a Three Layer Compression Therapy Procedure by [name], RN. Post procedure Diagnosis Wound #2: Same as Pre-Procedure Plan Follow-up Appointments: Return Appointment in 1 week. - Dr. Celine Ahr - Room 2 Anesthetic: (In clinic) Topical Lidocaine 5% applied to wound bed Bathing/ Shower/ Hygiene: May shower with protection but do not get wound dressing(s) wet. Protect dressing(s) with water repellant cover (for example, large plastic bag) or a cast cover and may  then take shower. Edema Control - Lymphedema / SCD / Other: Avoid standing for long periods of time. Exercise  regularly Moisturize legs daily. Compression stocking or Garment 20-30 mm/Hg pressure to: - left leg daily. Apply first thing in the morning, remove at night. WOUND #2: - Lower Leg Wound Laterality: Right, Posterior Cleanser: Soap and Water 1 x Per Week/30 Days Discharge Instructions: May shower and wash wound with dial antibacterial soap and water prior to dressing change. Cleanser: Wound Cleanser 1 x Per Week/30 Days Discharge Instructions: Cleanse the wound with wound cleanser prior to applying a clean dressing using gauze sponges, not tissue or cotton balls. Peri-Wound Care: Triamcinolone 15 (g) 1 x Per Week/30 Days Discharge Instructions: Use triamcinolone 15 (g) as directed Peri-Wound Care: Sween Lotion (Moisturizing lotion) 1 x Per Week/30 Days Discharge Instructions: Apply moisturizing lotion as directed Prim Dressing: Hydrofera Blue Ready Transfer Foam, 2.5x2.5 (in/in) 1 x Per Week/30 Days ary Discharge Instructions: Apply directly to wound bed as directed Secondary Dressing: Woven Gauze Sponge, Non-Sterile 4x4 in 1 x Per Week/30 Days Discharge Instructions: Apply over primary dressing as directed. Secondary Dressing: Zetuvit Plus 4x8 in 1 x Per Week/30 Days Discharge Instructions: Apply over primary dressing as directed. Com pression Wrap: ThreePress (3 layer compression wrap) 1 x Per Week/30 Days Discharge Instructions: Apply three layer compression as directed. Com pression Wrap: Unnaboot w/Calamine, 4x10 (in/yd) 1 x Per Week/30 Days Discharge Instructions: Apply Unnaboot at top of leg 04/19/2022: The wound is smaller this week. There is some slough accumulation on the surface. I used a curette to debride slough and nonviable subcutaneous tissue from the wound. We will continue to use her Keystone topical antimicrobial compound under Hydrofera Blue with 3 layer  compression. Follow-up in 1 week. Electronic Signature(s) Signed: 04/19/2022 3:52:08 PM By: Tommie Ard RN Signed: 04/19/2022 4:10:49 PM By: [name] MD FACS Previous Signature: 04/19/2022 3:40:31 PM Version By: [name] MD FACS Entered By: Tommie Ard on 04/19/2022 15:52:07 -------------------------------------------------------------------------------- HxROS Details Patient Name: Date of Service: [name], [name] 04/19/2022 2:45 PM Medical Record Number: [id number] Patient Account Number: [id number] Date of Birth/Sex: Treating RN: 11-03-1960 (62 y.o. F) Primary Care Provider: Gerilyn Pilgrim, BA RBA RA Other Clinician: EVANGALINE, JOU ([id number]) [id number] Page 9 of 11 Referring Provider: Treating Provider/Extender: [name] MICHA EL, BA RBA RA Weeks in Treatment: 12 Information Obtained From Patient Eyes Medical History: Negative for: Cataracts; Glaucoma; Optic Neuritis Ear/Nose/Mouth/Throat Medical History: Negative for: Chronic sinus problems/congestion; Middle ear problems Hematologic/Lymphatic Medical History: Positive for: Anemia - iron Negative for: Hemophilia; Human Immunodeficiency Virus; Lymphedema; Sickle Cell Disease Respiratory Medical History: Positive for: Sleep Apnea - CPAP Negative for: Aspiration; Asthma; Chronic Obstructive Pulmonary Disease (COPD); Pneumothorax; Tuberculosis Cardiovascular Medical History: Positive for: Hypertension; Peripheral Venous Disease Negative for: Angina; Arrhythmia; Congestive Heart Failure; Coronary Artery Disease; Deep Vein Thrombosis; Hypotension; Myocardial Infarction; Peripheral Arterial Disease; Phlebitis; Vasculitis Gastrointestinal Medical History: Negative for: Cirrhosis ; Colitis; Crohns; Hepatitis A; Hepatitis B; Hepatitis C Endocrine Medical History: Negative for: Type I Diabetes; Type II Diabetes Genitourinary Medical History: Negative for: End Stage Renal  Disease Past Medical History Notes: years ago kidney stones Immunological Medical History: Negative for: Lupus Erythematosus; Raynauds; Scleroderma Integumentary (Skin) Medical History: Negative for: History of Burn Musculoskeletal Medical History: Negative for: Gout; Rheumatoid Arthritis; Osteoarthritis; Osteomyelitis Neurologic Medical History: Negative for: Dementia; Neuropathy; Quadriplegia; Paraplegia; Seizure Disorder Oncologic Medical History: Negative for: Received Chemotherapy; Received Radiation Past Medical History Notes: skin Ca removed from back years ago LORREN, ROSSETTI ([id number]) [id number] Page 10 of 11 Psychiatric Medical History: Negative for: Anorexia/bulimia; Confinement Anxiety Immunizations Pneumococcal  Vaccine: Received Pneumococcal Vaccination: No Implantable Devices None Hospitalization / Surgery History Type of Hospitalization/Surgery cholecystectomy 1980s nephrolithasis 1980s plate and rod in right elbow surgery 2010 Family and Social History Cancer: Yes - Mother; Diabetes: Yes - Father; Heart Disease: No; Hypertension: Yes - Mother; Kidney Disease: Yes - Mother; Lung Disease: Yes - Father; Seizures: No; Stroke: No; Thyroid Problems: Yes - Paternal Grandparents; Tuberculosis: No; Never smoker; Marital Status - Married; Alcohol Use: Never; Drug Use: No History; Caffeine Use: Daily - coffee; Financial Concerns: No; Food, Clothing or Shelter Needs: No; Support System Lacking: No; Transportation Concerns: No Electronic Signature(s) Signed: 04/19/2022 4:10:49 PM By: Duanne Guessannon, Maximillian Habibi MD FACS Entered By: Duanne Guessannon, Mikale Silversmith on 04/19/2022 15:37:20 -------------------------------------------------------------------------------- SuperBill Details Patient Name: Date of Service: [name] [name], [name] 04/19/2022 Medical Record Number: [id number] Patient Account Number: [id number] Date of Birth/Sex: Treating RN: 02/11/1961 (62 y.o.  F) Primary Care Provider: Gerilyn PilgrimMICHA EL, BA RBA RA Other Clinician: Referring Provider: Treating Provider/Extender: Renelda Momannon, Alece Koppel MICHA EL, BA RBA RA Weeks in Treatment: 12 Diagnosis Coding ICD-10 Codes Code Description [phone] Non-pressure chronic ulcer of right calf with fat layer exposed E66.01 Morbid (severe) obesity due to excess calories I10 Essential (primary) hypertension R60.0 Localized edema Facility Procedures : CPT4 Code: [id number] Description: 11042 - DEB SUBQ TISSUE 20 SQ CM/< ICD-10 Diagnosis Description L97.212 Non-pressure chronic ulcer of right calf with fat layer exposed Modifier: Quantity: 1 Physician Procedures : CPT4 Code Description Modifier [id number] 99214 - WC PHYS LEVEL 4 - EST PT 25 ICD-10 Diagnosis Description L97.212 Non-pressure chronic ulcer of right calf with fat layer exposed E66.01 Morbid (severe) obesity due to excess calories I10 Essential  (primary) hypertension R60.0 Localized edema Quantity: 1 : [phone] 11042 - WC PHYS SUBQ TISS 20 SQ CM Hilton CorkWATKINS, Zaliah H ([id number]) [id number] Quantity: 1 7.pdf Page 11 of 11 : ICD-10 Diagnosis Description L97.212 Non-pressure chronic ulcer of right calf with fat layer exposed Quantity: Electronic Signature(s) Signed: 04/19/2022 3:40:53 PM By: Duanne Guessannon, Letia Guidry MD FACS Entered By: Duanne Guessannon, Marvelene Stoneberg on 04/19/2022 15:40:53

## 2022-04-20 NOTE — Progress Notes (Signed)
TUWANA, KAPAUN ([id number]) [id number] Page 1 of 8 Visit Report for 04/19/2022 Arrival Information Details Patient Name: Date of Service: [place], [name] 04/19/2022 2:45 PM Medical Record Number: [id number] Patient Account Number: [id number] Date of Birth/Sex: Treating RN: January 14, 1961 (62 y.o. F) Zochol, [name] Primary Care Latisha Lasch: Cecille Po, BA RBA RA Other Clinician: Referring Roquel Burgin: Treating Dearia [place]mouth/Extender: Bettey Mare EL, BA RBA RA Weeks in Treatment: 12 Visit Information History Since Last Visit Added or deleted any medications: No Patient Arrived: Ambulatory Any new allergies or adverse reactions: No Arrival Time: 15:00 Had a fall or experienced change in No Accompanied By: self activities of daily living that may affect Transfer Assistance: None risk of falls: Patient Identification Verified: Yes Signs or symptoms of abuse/neglect since last visito No Secondary Verification Process Completed: Yes Hospitalized since last visit: No Patient Requires Transmission-Based Precautions: No Implantable device outside of the clinic excluding No Patient Has Alerts: No cellular tissue based products placed in the center since last visit: Has Compression in Place as Prescribed: Yes Pain Present Now: Yes Electronic Signature(s) Signed: 04/19/2022 4:39:48 PM By: [name] RN Entered By: [name] on 04/19/2022 15:08:29 -------------------------------------------------------------------------------- Compression Therapy Details Patient Name: Date of Service: [name], [name] 04/19/2022 2:45 PM Medical Record Number: [id number] Patient Account Number: [id number] Date of Birth/Sex: Treating RN: 11/18/1960 (62 y.o. [name], [name] Primary Care Tykee Heideman: Cecille Po, [facility] RBA RA Other Clinician: Referring Wille Aubuchon: Treating Anothony Bursch/Extender: Bettey Mare EL, BA RBA RA Weeks in Treatment: 12 Compression Therapy Performed for  Wound Assessment: Wound #2 Right,Posterior Lower Leg Performed By: Clinician [name], RN Compression Type: Three Layer Post Procedure Diagnosis Same as Pre-procedure Electronic Signature(s) Signed: 04/19/2022 3:49:36 PM By: [name] RN Entered By: [name] on 04/19/2022 15:49:36 [name] ([id number]) [id number] Page 2 of 8 -------------------------------------------------------------------------------- Encounter Discharge Information Details Patient Name: Date of Service: [place] 04/19/2022 2:45 PM Medical Record Number: [id number] Patient Account Number: [id number] Date of Birth/Sex: Treating RN: [birth year] (62 y.o. F) Zochol, [place] Primary Care Tianah Lonardo: Cecille Po, [facility] RBA RA Other Clinician: Referring Pixie Burgener: Treating Cisco Kindt/Extender: Bettey Mare EL, BA RBA RA Weeks in Treatment: 12 Encounter Discharge Information Items Post Procedure Vitals Discharge Condition: Stable Temperature (F): 98.1 Ambulatory Status: Ambulatory Pulse (bpm): 105 Discharge Destination: Home Respiratory Rate (breaths/min): 18 Transportation: Private Auto Blood Pressure (mmHg): 173/81 Accompanied By: self Schedule Follow-up Appointment: Yes Clinical Summary of Care: Electronic Signature(s) Signed: 04/19/2022 3:51:46 PM By: [name] RN Entered By: [name] on 04/19/2022 15:51:46 -------------------------------------------------------------------------------- Lower Extremity Assessment Details Patient Name: Date of Service: [place], [name] 04/19/2022 2:45 PM Medical Record Number: [id number] Patient Account Number: [id number] Date of Birth/Sex: Treating RN: 11-Jun-1960 (62 y.o. F) Zochol, [place] Primary Care Mahalie Kanner: Cecille Po, [facility] RBA RA Other Clinician: Referring Meliton Samad: Treating Jocie Meroney/Extender: Bettey Mare EL, BA RBA RA Weeks in Treatment: 12 Edema Assessment Assessed: [Left: No] [Right:  No] [Left: Edema] [Right: :] Calf Left: Right: Point of Measurement: From Medial Instep 45 cm Ankle Left: Right: Point of Measurement: From Medial Instep 23.5 cm Vascular Assessment Pulses: Dorsalis Pedis Palpable: [Right:Yes] Electronic Signature(s) Signed: 04/19/2022 4:39:48 PM By: [name] RN Entered By: [name] on 04/19/2022 15:09:34 Multi Wound Chart Details -------------------------------------------------------------------------------- [name] ([id number]) [id number] Page 3 of 8 Patient Name: Date of Service: [place], [name] 04/19/2022 2:45 PM Medical Record Number: [id number] Patient Account Number: [id number] Date of Birth/Sex: Treating RN: Mar 06, 1961 (  62 y.o. F) Primary Care Lucero Auzenne: Cecille Po, BA RBA RA Other Clinician: Referring Camela Wich: Treating Shamell Suarez/Extender: Bettey Mare EL, BA RBA RA Weeks in Treatment: 12 Vital Signs Height(in): Pulse(bpm): 105 Weight(lbs): Blood Pressure(mmHg): 173/81 Body Mass Index(BMI): Temperature(F): 98.1 Respiratory Rate(breaths/min): 18 Wound Assessments Wound Number: 2 N/A N/A Photos: N/A N/A Right, Posterior Lower Leg N/A N/A Wound Location: Insect Bite N/A N/A Wounding Event: Venous Leg Ulcer N/A N/A Primary Etiology: Anemia, Sleep Apnea, Hypertension, N/A N/A Comorbid History: Peripheral Venous Disease 11/21/2021 N/A N/A Date Acquired: 12 N/A N/A Weeks of Treatment: Open N/A N/A Wound Status: No N/A N/A Wound Recurrence: 2.3x2.3x0.2 N/A N/A Measurements L x W x D (cm) 4.155 N/A N/A A (cm) : rea 0.831 N/A N/A Volume (cm) : -1159.10% N/A N/A % Reduction in Area: -739.40% N/A N/A % Reduction in Volume: Full Thickness Without Exposed N/A N/A Classification: Support Structures Medium N/A N/A Exudate Amount: Serosanguineous N/A N/A Exudate Type: red, brown N/A N/A Exudate Color: Distinct, outline attached N/A N/A Wound Margin: Small  (1-33%) N/A N/A Granulation Amount: Red, Friable N/A N/A Granulation Quality: Large (67-100%) N/A N/A Necrotic Amount: Fat Layer (Subcutaneous Tissue): Yes N/A N/A Exposed Structures: Fascia: No Tendon: No Muscle: No Joint: No Bone: No Small (1-33%) N/A N/A Epithelialization: Scarring: Yes N/A N/A Periwound Skin Texture: Excoriation: No No Abnormalities Noted N/A N/A Periwound Skin Moisture: Erythema: Yes N/A N/A Periwound Skin Color: Hemosiderin Staining: Yes Atrophie Blanche: No Ecchymosis: No Rubor: No No Abnormality N/A N/A Temperature: Treatment Notes Electronic Signature(s) Signed: 04/19/2022 3:35:16 PM By: Fredirick Maudlin MD FACS Entered By: Fredirick Maudlin on 04/19/2022 15:35:16 [name] ([id number]) [id number] Page 4 of 8 -------------------------------------------------------------------------------- Multi-Disciplinary Care Plan Details Patient Name: Date of Service: [place], [name] 04/19/2022 2:45 PM Medical Record Number: [id number] Patient Account Number: [id number] Date of Birth/Sex: Treating RN: 29-Mar-1961 (62 y.o. F) Zochol, [name] Primary Care Hadley Soileau: Cecille Po, [facility] RBA RA Other Clinician: Referring Zamaria Brazzle: Treating Lilia Letterman/Extender: Bettey Mare EL, BA RBA RA Weeks in Treatment: [place] reviewed with physician Active Inactive Necrotic Tissue Nursing Diagnoses: Impaired tissue integrity related to necrotic/devitalized tissue Knowledge deficit related to management of necrotic/devitalized tissue Goals: Necrotic/devitalized tissue will be minimized in the wound bed Date Initiated: 01/23/2022 Target Resolution Date: 06/06/2022 Goal Status: Active Patient/caregiver will verbalize understanding of reason and process for debridement of necrotic tissue Date Initiated: 01/23/2022 Target Resolution Date: 06/06/2022 Goal Status: Active Interventions: Assess patient pain level  pre-, during and post procedure and prior to discharge Provide education on necrotic tissue and debridement process Treatment Activities: Apply topical anesthetic as ordered : 01/23/2022 Notes: Wound/Skin Impairment Nursing Diagnoses: Impaired tissue integrity Knowledge deficit related to ulceration/compromised skin integrity Goals: Patient/caregiver will verbalize understanding of skin care regimen Date Initiated: 01/23/2022 Target Resolution Date: 06/06/2022 Goal Status: Active Interventions: Assess patient/caregiver ability to obtain necessary supplies Assess ulceration(s) every visit Treatment Activities: Skin care regimen initiated : 01/23/2022 Topical wound management initiated : 01/23/2022 Notes: Electronic Signature(s) Signed: 04/19/2022 3:50:10 PM By: [name] RN Entered By: [name] on 04/19/2022 15:50:10 [name] ([id number]) [id number] Page 5 of 8 -------------------------------------------------------------------------------- Pain Assessment Details Patient Name: Date of Service: [name] 04/19/2022 2:45 PM Medical Record Number: [id number] Patient Account Number: [id number] Date of Birth/Sex: Treating RN: [birth year] (62 y.o. F) Zochol, [name] Primary Care Kathleena Freeman: Cecille Po, [facility] RBA RA Other Clinician: Referring Brasen Bundren: Treating Zackry Deines/Extender: Bettey Mare EL, BA RBA RA Weeks in Treatment: 12  Active Problems Location of Pain Severity and Description of Pain Patient Has Paino No Site Locations Rate the pain. Current Pain Level: 2 Character of Pain Describe the Pain: Aching Pain Management and Medication Current Pain Management: Electronic Signature(s) Signed: 04/19/2022 4:39:48 PM By: [name] RN Entered By: [name] on 04/19/2022 15:08:59 -------------------------------------------------------------------------------- Patient/Caregiver Education Details Patient Name: Date of  Service: [name], Spero [place] [name] 1/12/2024andnbsp2:45 PM Medical Record Number: [id number] Patient Account Number: [id number] Date of Birth/Gender: Treating RN: 08-Jan-1961 (62 y.o. F) Zochol, [place] Primary Care Physician: Cecille Po, [facility] RBA RA Other Clinician: Referring Physician: Treating Physician/Extender: Bettey Mare EL, BA RBA RA Weeks in Treatment: 12 Education Assessment Education Provided To: Patient Education Topics Provided Wound Debridement: Methods: Explain/Verbal Responses: Reinforcements needed, State content correctly Wound/Skin Impairment: Methods: Explain/Verbal Responses: Reinforcements needed, State content correctly WILLAMENA, KHALIFA ([id number]) [id number].pdf Page 6 of 8 Electronic Signature(s) Signed: 04/19/2022 4:39:48 PM By: [name] RN Entered By: [name] on 04/19/2022 15:50:35 -------------------------------------------------------------------------------- Wound Assessment Details Patient Name: Date of Service: [place], [name] 04/19/2022 2:45 PM Medical Record Number: [id number] Patient Account Number: [id number] Date of Birth/Sex: Treating RN: 19-Nov-1960 (62 y.o. F) Zochol, [name] Primary Care Jerrald Doverspike: Cecille Po, BA RBA RA Other Clinician: Referring Tylee Newby: Treating Deyjah Kindel/Extender: Bettey Mare EL, BA RBA RA Weeks in Treatment: 12 Wound Status Wound Number: 2 Primary Venous Leg Ulcer Etiology: Wound Location: Right, Posterior Lower Leg Wound Status: Open Wounding Event: Insect Bite Comorbid Anemia, Sleep Apnea, Hypertension, Peripheral Venous Date Acquired: 11/21/2021 History: Disease Weeks Of Treatment: 12 Clustered Wound: No Photos Wound Measurements Length: (cm) 2.3 Width: (cm) 2.3 Depth: (cm) 0.2 Area: (cm) 4.155 Volume: (cm) 0.831 % Reduction in Area: -1159.1% % Reduction in Volume: -739.4% Epithelialization: Small (1-33%) Tunneling: No Undermining: No Wound  Description Classification: Full Thickness Without Exposed Suppor Wound Margin: Distinct, outline attached Exudate Amount: Medium Exudate Type: Serosanguineous Exudate Color: red, brown t Structures Foul Odor After Cleansing: No Slough/Fibrino Yes Wound Bed Granulation Amount: Small (1-33%) Exposed Structure Granulation Quality: Red, Friable Fascia Exposed: No Necrotic Amount: Large (67-100%) Fat Layer (Subcutaneous Tissue) Exposed: Yes Necrotic Quality: Adherent Slough Tendon Exposed: No Muscle Exposed: No Joint Exposed: No Bone Exposed: No Periwound Skin Texture Texture Color No Abnormalities Noted: No No Abnormalities Noted: No Excoriation: No Atrophie Blanche: No Scarring: Yes Ecchymosis: No JEILA, HUFFSTUTLER ([id number]) [id number].pdf Page 7 of 8 Erythema: Yes Moisture Hemosiderin Staining: Yes No Abnormalities Noted: Yes Rubor: No Temperature / Pain Temperature: No Abnormality Treatment Notes Wound #2 (Lower Leg) Wound Laterality: Right, Posterior Cleanser Soap and Water Discharge Instruction: May shower and wash wound with dial antibacterial soap and water prior to dressing change. Wound Cleanser Discharge Instruction: Cleanse the wound with wound cleanser prior to applying a clean dressing using gauze sponges, not tissue or cotton balls. Peri-Wound Care Triamcinolone 15 (g) Discharge Instruction: Use triamcinolone 15 (g) as directed Sween Lotion (Moisturizing lotion) Discharge Instruction: Apply moisturizing lotion as directed Topical Primary Dressing Hydrofera Blue Ready Transfer Foam, 2.5x2.5 (in/in) Discharge Instruction: Apply directly to wound bed as directed Secondary Dressing Woven Gauze Sponge, Non-Sterile 4x4 in Discharge Instruction: Apply over primary dressing as directed. Zetuvit Plus 4x8 in Discharge Instruction: Apply over primary dressing as directed. Secured With Compression Wrap ThreePress (3 layer compression  wrap) Discharge Instruction: Apply three layer compression as directed. Unnaboot w/Calamine, 4x10 (in/yd) Discharge Instruction: Apply Unnaboot at top of leg Compression Stockings Add-Ons Electronic Signature(s) Signed: 04/19/2022 4:39:48 PM By: [name] RN  Entered By: Tommie Ard on 04/19/2022 15:12:09 -------------------------------------------------------------------------------- Vitals Details Patient Name: Date of Service: [place], [name] 04/19/2022 2:45 PM Medical Record Number: [id number] Patient Account Number: [id number] Date of Birth/Sex: Treating RN: February 22, 1961 (61 y.o. F) Zochol, [name] Primary Care Kahlia Lagunes: Gerilyn Pilgrim, BA RBA RA Other Clinician: Referring Vayda Dungee: Treating Cannan Beeck/Extender: Renelda Mom EL, BA RBA RA Weeks in Treatment: 12 Vital Signs Time Taken: 15:00 Temperature (F): 98.1 Pulse (bpm): 105 Respiratory Rate (breaths/min): 18 Blood Pressure (mmHg): 173/81 Reference Range: 80 - 120 mg / dl KIARAH, ECKSTEIN ([id number]) [phone].pdf Page 8 of 8 Electronic Signature(s) Signed: 04/19/2022 4:39:48 PM By: Tommie Ard RN Entered By: Tommie Ard on 04/19/2022 15:08:49

## 2022-04-25 ENCOUNTER — Telehealth: Payer: Self-pay | Admitting: Family Medicine

## 2022-04-25 NOTE — Telephone Encounter (Signed)
Pharmacy is saying they do not have the prescription for lidocaine (XYLOCAINE) 5 % ointment

## 2022-04-25 NOTE — Telephone Encounter (Signed)
I spoke with the patient she stated the pharmacy updated her new insurance for the year and stated they will have her medication in stock on 04/29/22 and she will pick up prescription at that time.

## 2022-04-26 ENCOUNTER — Encounter: Payer: Self-pay | Admitting: Nurse Practitioner

## 2022-04-26 ENCOUNTER — Encounter (HOSPITAL_BASED_OUTPATIENT_CLINIC_OR_DEPARTMENT_OTHER): Payer: BC Managed Care – PPO | Admitting: General Surgery

## 2022-04-26 ENCOUNTER — Ambulatory Visit: Payer: BC Managed Care – PPO | Admitting: Nurse Practitioner

## 2022-04-26 VITALS — BP 120/72 | HR 94 | Temp 98.0°F | Ht 60.0 in | Wt 277.0 lb

## 2022-04-26 DIAGNOSIS — G4733 Obstructive sleep apnea (adult) (pediatric): Secondary | ICD-10-CM

## 2022-04-26 DIAGNOSIS — L97212 Non-pressure chronic ulcer of right calf with fat layer exposed: Secondary | ICD-10-CM | POA: Diagnosis not present

## 2022-04-26 DIAGNOSIS — R0609 Other forms of dyspnea: Secondary | ICD-10-CM

## 2022-04-26 NOTE — Patient Instructions (Addendum)
Continue to use CPAP every night, minimum of 4-6 hours a night.  Change equipment every 30 days or as directed by DME. Wash your tubing with warm soap and water daily, hang to dry. Wash humidifier portion weekly.  Be aware of reduced alertness and do not drive or operate heavy machinery if experiencing this or drowsiness.  Exercise encouraged, as tolerated. Notify if persistent daytime sleepiness occurs even with consistent use of CPAP.  Orders placed for supplies. Let me know if the leaks persist once you get a new mask   Trial Albuterol 2 puffs every 6 hours as needed for shortness of breath or wheezing  Pulmonary function testing ordered   Follow up with Dr. Elsworth Soho or Roxan Diesel, NP after PFTs. If symptoms do not improve or worsen, please contact office for sooner follow up or seek emergency care.

## 2022-04-26 NOTE — Assessment & Plan Note (Signed)
Possibly multifactorial and related to obesity and physical deconditioning. No significant pulmonary history and never smoker. Lung exam clear today. We will obtain PFTs for further evaluation. Provided with trial of albuterol in interim. Instructed not to use day of PFT. Will consider further workup depending on results.

## 2022-04-26 NOTE — Progress Notes (Signed)
IMAN, REINERTSEN ([id number]) [id number] Page 1 of 8 Visit Report for 04/26/2022 Arrival Information Details Patient Name: Date of Service: [place], [name]. 04/26/2022 7:45 A M Medical Record Number: [id number] Patient Account Number: [id number] Date of Birth/Sex: Treating RN: 05/31/1960 (62 y.o. [name], [name] Primary Care Tyriana Helmkamp: Nira Conn Other Clinician: Referring Cythnia Osmun: Treating Carmina Walle/Extender: Andrey Campanile in Treatment: 13 Visit Information History Since Last Visit Added or deleted any medications: No Patient Arrived: Ambulatory Any new allergies or adverse reactions: No Arrival Time: 07:45 Had a fall or experienced change in No Accompanied By: self activities of daily living that may affect Transfer Assistance: None risk of falls: Patient Identification Verified: Yes Signs or symptoms of abuse/neglect since last visito No Secondary Verification Process Completed: Yes Hospitalized since last visit: No Patient Requires Transmission-Based Precautions: No Implantable device outside of the clinic excluding No Patient Has Alerts: No cellular tissue based products placed in the center since last visit: Has Compression in Place as Prescribed: Yes Pain Present Now: No Electronic Signature(s) Signed: 04/26/2022 4:11:32 PM By: Tommie Ard RN Entered By: Tommie Ard on 04/26/2022 08:01:16 -------------------------------------------------------------------------------- Compression Therapy Details Patient Name: Date of Service: [name], [name] 04/26/2022 7:45 A M Medical Record Number: [id number] Patient Account Number: [id number] Date of Birth/Sex: Treating RN: [birth year] (62 y.o. Kateri Mc Primary Care Serapio Edelson: Nira Conn Other Clinician: Referring Jette Lewan: Treating Quoc Tome/Extender: Andrey Campanile in Treatment: 13 Compression Therapy Performed for Wound  Assessment: Wound #2 Right,Posterior Lower Leg Performed By: Clinician Tommie Ard, RN Compression Type: Three Layer Post Procedure Diagnosis Same as Pre-procedure Electronic Signature(s) Signed: 04/26/2022 4:11:32 PM By: Tommie Ard RN Entered By: Tommie Ard on 04/26/2022 08:10:32 [name] ([id number]) [id number] Page 2 of 8 -------------------------------------------------------------------------------- Encounter Discharge Information Details Patient Name: Date of Service: [place] 04/26/2022 7:45 A M Medical Record Number: [id number] Patient Account Number: [id number] Date of Birth/Sex: Treating RN: October 13, 1960 (62 y.o. Kateri Mc Primary Care Ladeidra Borys: Nira Conn Other Clinician: Referring Bastien Strawser: Treating Matteo Banke/Extender: Andrey Campanile in Treatment: 13 Encounter Discharge Information Items Post Procedure Vitals Discharge Condition: Stable Temperature (F): 98.4 Ambulatory Status: Ambulatory Pulse (bpm): 90 Discharge Destination: Home Respiratory Rate (breaths/min): 18 Transportation: Private Auto Blood Pressure (mmHg): 125/74 Accompanied By: self Schedule Follow-up Appointment: Yes Clinical Summary of Care: Electronic Signature(s) Signed: 04/26/2022 4:11:32 PM By: Tommie Ard RN Entered By: Tommie Ard on 04/26/2022 08:30:42 -------------------------------------------------------------------------------- Lower Extremity Assessment Details Patient Name: Date of Service: [place], [name] 04/26/2022 7:45 A M Medical Record Number: [id number] Patient Account Number: [id number] Date of Birth/Sex: Treating RN: 04/07/1961 (62 y.o. Kateri Mc Primary Care Rodricus Candelaria: Nira Conn Other Clinician: Referring Kendrix Orman: Treating Yoceline Bazar/Extender: Andrey Campanile in Treatment: 13 Edema Assessment Assessed: [name]: No] Franne Forts: No] [Left: Edema] [Right:  :] Calf Left: Right: Point of Measurement: From Medial Instep 44.8 cm Ankle Left: Right: Point of Measurement: From Medial Instep 22.5 cm Vascular Assessment Pulses: Dorsalis Pedis Palpable: [Right:Yes] Electronic Signature(s) Signed: 04/26/2022 4:11:32 PM By: Tommie Ard RN Entered By: Tommie Ard on 04/26/2022 08:04:45 Multi Wound Chart Details -------------------------------------------------------------------------------- [name] ([id number]) [id number] Page 3 of 8 Patient Name: Date of Service: [place], [name] 04/26/2022 7:45 A M Medical Record Number: [id number] Patient Account Number: [id number] Date of Birth/Sex: Treating RN: 1960/11/04 (62 y.o. F) Primary Care Meighan Treto: Nira Conn Other Clinician: Referring Adreona Brand: Treating Ernestine Rohman/Extender: Andrey Campanile  in Treatment: 13 Vital Signs Height(in): Pulse(bpm): 90 Weight(lbs): Blood Pressure(mmHg): 125/74 Body Mass Index(BMI): Temperature(F): 98.4 Respiratory Rate(breaths/min): 18 Wound Assessments Wound Number: 2 N/A N/A Photos: N/A N/A Right, Posterior Lower Leg N/A N/A Wound Location: Insect Bite N/A N/A Wounding Event: Venous Leg Ulcer N/A N/A Primary Etiology: Anemia, Sleep Apnea, Hypertension, N/A N/A Comorbid History: Peripheral Venous Disease 11/21/2021 N/A N/A Date Acquired: 13 N/A N/A Weeks of Treatment: Open N/A N/A Wound Status: No N/A N/A Wound Recurrence: 2.2x2x0.2 N/A N/A Measurements L x W x D (cm) 3.456 N/A N/A A (cm) : rea 0.691 N/A N/A Volume (cm) : -947.30% N/A N/A % Reduction in A rea: -598.00% N/A N/A % Reduction in Volume: Full Thickness Without Exposed N/A N/A Classification: Support Structures Medium N/A N/A Exudate A mount: Serosanguineous N/A N/A Exudate Type: red, brown N/A N/A Exudate Color: Distinct, outline attached N/A N/A Wound Margin: Small (1-33%) N/A N/A Granulation A  mount: Red, Friable N/A N/A Granulation Quality: Large (67-100%) N/A N/A Necrotic A mount: Fat Layer (Subcutaneous Tissue): Yes N/A N/A Exposed Structures: Fascia: No Tendon: No Muscle: No Joint: No Bone: No Small (1-33%) N/A N/A Epithelialization: Debridement - Selective/Open Wound N/A N/A Debridement: Pre-procedure Verification/Time Out 08:07 N/A N/A Taken: Lidocaine 5% topical ointment N/A N/A Pain Control: Slough N/A N/A Tissue Debrided: Non-Viable Tissue N/A N/A Level: 4.4 N/A N/A Debridement A (sq cm): rea Curette N/A N/A Instrument: Minimum N/A N/A Bleeding: Pressure N/A N/A Hemostasis A chieved: Procedure was tolerated well N/A N/A Debridement Treatment Response: 2.2x2x0.2 N/A N/A Post Debridement Measurements L x W x D (cm) 0.691 N/A N/A Post Debridement Volume: (cm) Scarring: Yes N/A N/A Periwound Skin Texture: Excoriation: No No Abnormalities Noted N/A N/A Periwound Skin Moisture: Erythema: Yes N/A N/A Periwound Skin Color: Hemosiderin Staining: Yes Atrophie Blanche: No Ecchymosis: No Rubor: No No Abnormality N/A N/A Temperature: Compression Therapy N/A N/A Procedures Performed: Debridement TANAJA, GANGER ([id number]) [id number] Page 4 of 8 Treatment Notes Electronic Signature(s) Signed: 04/26/2022 8:15:03 AM By: Fredirick Maudlin MD FACS Entered By: Fredirick Maudlin on 04/26/2022 08:15:03 -------------------------------------------------------------------------------- Multi-Disciplinary Care Plan Details Patient Name: Date of Service: [place], [name] 04/26/2022 7:45 A M Medical Record Number: [id number] Patient Account Number: [id number] Date of Birth/Sex: Treating RN: Feb 17, 1961 (62 y.o. [name] Primary Care Doyne Micke: Loralyn Freshwater Other Clinician: Referring Coreena Rubalcava: Treating Chantille Navarrete/Extender: Chesley Noon in Treatment: [place] reviewed  with physician Active Inactive Necrotic Tissue Nursing Diagnoses: Impaired tissue integrity related to necrotic/devitalized tissue Knowledge deficit related to management of necrotic/devitalized tissue Goals: Necrotic/devitalized tissue will be minimized in the wound bed Date Initiated: 01/23/2022 Target Resolution Date: 06/06/2022 Goal Status: Active Patient/caregiver will verbalize understanding of reason and process for debridement of necrotic tissue Date Initiated: 01/23/2022 Target Resolution Date: 06/06/2022 Goal Status: Active Interventions: Assess patient pain level pre-, during and post procedure and prior to discharge Provide education on necrotic tissue and debridement process Treatment Activities: Apply topical anesthetic as ordered : 01/23/2022 Notes: Wound/Skin Impairment Nursing Diagnoses: Impaired tissue integrity Knowledge deficit related to ulceration/compromised skin integrity Goals: Patient/caregiver will verbalize understanding of skin care regimen Date Initiated: 01/23/2022 Target Resolution Date: 06/06/2022 Goal Status: Active Interventions: Assess patient/caregiver ability to obtain necessary supplies Assess ulceration(s) every visit Treatment Activities: Skin care regimen initiated : 01/23/2022 Topical wound management initiated : 01/23/2022 Notes: Electronic Signature(s) Signed: 04/26/2022 4:11:32 PM By: [name] RN [name] ([id number]) [id number] Page 5 of 8 Entered By: [name] on  04/26/2022 08:29:40 -------------------------------------------------------------------------------- Pain Assessment Details Patient Name: Date of Service: [place], [name] 04/26/2022 7:45 A M Medical Record Number: [id number] Patient Account Number: [id number] Date of Birth/Sex: Treating RN: 09-18-1960 (62 y.o. [name] Primary Care Milaya Hora: Loralyn Freshwater Other Clinician: Referring Rockwell Zentz: Treating  Eshaan Titzer/Extender: Chesley Noon in Treatment: 13 Active Problems Location of Pain Severity and Description of Pain Patient Has Paino Patient Unable to Respond Site Locations Rate the pain. Current Pain Level: 0 Pain Management and Medication Current Pain Management: Electronic Signature(s) Signed: 04/26/2022 4:11:32 PM By: [name] RN Entered By: [name] on 04/26/2022 08:04:32 -------------------------------------------------------------------------------- Patient/Caregiver Education Details Patient Name: Date of Service: [name], Spero Curb [name] 1/19/2024andnbsp7:45 A M Medical Record Number: [id number] Patient Account Number: [id number] Date of Birth/Gender: Treating RN: 11-21-1960 (62 y.o. [name] Primary Care Physician: Loralyn Freshwater Other Clinician: Referring Physician: Treating Physician/Extender: Chesley Noon in Treatment: 13 Education Assessment Education Provided To: Patient Education Topics Provided Wound Debridement: BAYLOR, TEEGARDEN ([id number]) [phone].pdf Page 6 of 8 Methods: Explain/Verbal Responses: Reinforcements needed, State content correctly Wound/Skin Impairment: Methods: Explain/Verbal Responses: Reinforcements needed, State content correctly Electronic Signature(s) Signed: 04/26/2022 4:11:32 PM By: [name] RN Entered By: [name] on 04/26/2022 08:29:57 -------------------------------------------------------------------------------- Wound Assessment Details Patient Name: Date of Service: [name], [name] 04/26/2022 7:45 A M Medical Record Number: [id number] Patient Account Number: [id number] Date of Birth/Sex: Treating RN: 12-20-1960 (62 y.o. [name] Primary Care Blandon Offerdahl: Loralyn Freshwater Other Clinician: Referring Bayan Kushnir: Treating Inda Mcglothen/Extender: Chesley Noon in Treatment: 13 Wound Status Wound  Number: 2 Primary Venous Leg Ulcer Etiology: Wound Location: Right, Posterior Lower Leg Wound Status: Open Wounding Event: Insect Bite Comorbid Anemia, Sleep Apnea, Hypertension, Peripheral Venous Date Acquired: 11/21/2021 History: Disease Weeks Of Treatment: 13 Clustered Wound: No Photos Wound Measurements Length: (cm) 2.2 Width: (cm) 2 Depth: (cm) 0.2 Area: (cm) 3.456 Volume: (cm) 0.691 % Reduction in Area: -947.3% % Reduction in Volume: -598% Epithelialization: Small (1-33%) Tunneling: No Undermining: No Wound Description Classification: Full Thickness Without Exposed Suppor Wound Margin: Distinct, outline attached Exudate Amount: Medium Exudate Type: Serosanguineous Exudate Color: red, brown t Structures Foul Odor After Cleansing: No Slough/Fibrino Yes Wound Bed Granulation Amount: Small (1-33%) Exposed Structure Granulation Quality: Red, Friable Fascia Exposed: No Necrotic Amount: Large (67-100%) Fat Layer (Subcutaneous Tissue) Exposed: Yes Necrotic Quality: Adherent Slough Tendon Exposed: No Muscle Exposed: No Joint Exposed: No Bone Exposed: No FLOIS, MCTAGUE ([id number]) [id number].pdf Page 7 of 8 Periwound Skin Texture Texture Color No Abnormalities Noted: No No Abnormalities Noted: No Excoriation: No Atrophie Blanche: No Scarring: Yes Ecchymosis: No Erythema: Yes Moisture Hemosiderin Staining: Yes No Abnormalities Noted: Yes Rubor: No Temperature / Pain Temperature: No Abnormality Treatment Notes Wound #2 (Lower Leg) Wound Laterality: Right, Posterior Cleanser Soap and Water Discharge Instruction: May shower and wash wound with dial antibacterial soap and water prior to dressing change. Wound Cleanser Discharge Instruction: Cleanse the wound with wound cleanser prior to applying a clean dressing using gauze sponges, not tissue or cotton balls. Peri-Wound Care Triamcinolone 15 (g) Discharge Instruction: Use  triamcinolone 15 (g) as directed Sween Lotion (Moisturizing lotion) Discharge Instruction: Apply moisturizing lotion as directed Topical Skintegrity Hydrogel 4 (oz) Discharge Instruction: Apply hydrogel as directed keystone Primary Dressing Promogran Prisma Matrix, 4.34 (sq in) (silver collagen) Discharge Instruction: Moisten collagen with saline or hydrogel Secondary Dressing Woven Gauze Sponge, Non-Sterile 4x4 in Discharge Instruction: Apply over primary dressing as  directed. Zetuvit Plus 4x8 in Discharge Instruction: Apply over primary dressing as directed. Secured With Compression Wrap ThreePress (3 layer compression wrap) Discharge Instruction: Apply three layer compression as directed. Unnaboot w/Calamine, 4x10 (in/yd) Discharge Instruction: Apply Unnaboot at top of leg Compression Stockings Add-Ons Electronic Signature(s) Signed: 04/26/2022 4:11:32 PM By: Tommie Ard RN Entered By: Tommie Ard on 04/26/2022 07:59:01 -------------------------------------------------------------------------------- Vitals Details Patient Name: Date of Service: [name], [name] 04/26/2022 7:45 A M Medical Record Number: [id number] Patient Account Number: [id number] Date of Birth/Sex: Treating RN: [birth year] (62 y.o. Kateri Mc Primary Care Lawanna Cecere: Nira Conn Other Clinician: Referring Trixy Loyola: Treating Rosette Bellavance/Extender: Heinz Knuckles [place], Romie Minus ([id number]) [phone].pdf Page 8 of 8 Weeks in Treatment: 13 Vital Signs Time Taken: 07:50 Temperature (F): 98.4 Pulse (bpm): 90 Respiratory Rate (breaths/min): 18 Blood Pressure (mmHg): 125/74 Reference Range: 80 - 120 mg / dl Electronic Signature(s) Signed: 04/26/2022 4:11:32 PM By: Tommie Ard RN Entered By: Tommie Ard on 04/26/2022 08:01:32

## 2022-04-26 NOTE — Progress Notes (Signed)
@Patient  ID: , female    DOB: 1960-07-14, 62 y.o.   MRN: [id number]  Chief Complaint  Patient presents with   Follow-up    C/o SOB and some wheeze with exertion.  Sleeping well.    Referring provider: No ref. provider found  HPI: 62 year old female, never smoker followed for OSA on CPAP and DOE.  She is patient Dr. 62 and last seen in office 07/03/2020.  Past medical history significant for hypertension, hypothyroid, osteoporosis.  TEST/EVENTS:  2015 [facility] [place]: Severe OSA, AHI 80/h  07/03/2020: OV with Dr. 07/05/2020.  She was diagnosed around 5 years ago with a sleep study done in [place] which showed AHI of 80/h.  Still using old supplies.  Has not renewed this for a year and a half.  Reports leak around her machine.  Wakes up with dryness of the mouth.  No headaches.  Very compliant.  Plan to set her up with a local DME to get her new CPAP supplies including filter, hose, new airFit F20 small fullface mask.  CPAP settings are set at 7-12 cmH2O.  Seem to be working well for her.  Average pressure of 9 cm.  Large leak is likely related to old mask and interface wearing off.  She does complain of some dyspnea on exertion.  Likely related to obesity and deconditioning.  04/26/2022: Today - follow up Patient presents today for follow-up with her husband, who I am also seeing as a patient.  She has been doing well with her CPAP since she was here last almost 2 years ago.  She wears it nightly.  Not having any difficulties.  Is having some leaks, which she attributes to an old mask.  Usually these correct when she switches her supplies out.  Her machine is relatively old.  She has not gotten a new 1 since her initial study around 8 or so years ago.  It seems to be working okay for right now.  She would like to see if a new 1 would be something that is affordable for her.  Denies drowsy driving, morning headaches, excessive daytime fatigue.  She has been having some more trouble  with shortness of breath on exertion.  She says that after long distances, stairs or uphill climbing she tends to get winded and has to rest to catch her breath.  This has been ongoing for a long time now but her PCP recently had something to her about it.  She thinks that it might have gotten worse over the last few years.  She also notices some associated wheezing at times.  Denies any cough, chest congestion, fevers, night sweats, lower extremity swelling, orthopnea, PND.  She does not have a history of childhood asthma.  No recurrent bronchitis.  No significant occupational or environmental exposures.  Does not notice that she really has any environmental allergies.  She was exposed to secondhand smoke for a long time.  Her father had COPD.  She is a never smoker.  Allergies  Allergen Reactions   Ivp Dye [Iodinated Contrast Media]     Hives, hot   Sulfa Antibiotics     Hives, hot flashes    Immunization History  Administered Date(s) Administered   Influenza,inj,Quad PF,6+ Mos 02/03/2019, 05/03/2020, 03/28/2021, 02/18/2022   PFIZER(Purple Top)SARS-COV-2 Vaccination 06/04/2019, 06/26/2019   Zoster Recombinat (Shingrix) 02/03/2019    Past Medical History:  Diagnosis Date   Basal cell carcinoma (BCC) in situ of skin    Hypertension  Hypothyroid    Nephrolithiasis    OSA (obstructive sleep apnea)     Tobacco History: Social History   Tobacco Use  Smoking Status Never  Smokeless Tobacco Never   Counseling given: Not Answered   Outpatient Medications Prior to Visit  Medication Sig Dispense Refill   acetaminophen (TYLENOL) 500 MG tablet Take 500 mg by mouth as needed.     alendronate (FOSAMAX) 70 MG tablet Take 1 tablet (70 mg total) by mouth every 7 (seven) days. Take with a full glass of water on an empty stomach. 12 tablet 3   Calcium Carb-Cholecalciferol (CALCIUM 600 + D) 600-200 MG-UNIT TABS Take 1 tablet by mouth daily. 30 tablet 0   diphenhydrAMINE (BENADRYL) 25 MG  tablet Take 25 mg by mouth as needed.     diphenhydramine-acetaminophen (TYLENOL PM) 25-500 MG TABS tablet Take 1 tablet by mouth at bedtime as needed.     ELDERBERRY PO Take by mouth.     famotidine (PEPCID) 20 MG tablet Take 20 mg by mouth daily as needed for heartburn or indigestion.     Ferrous Sulfate Dried (FERROUS SULFATE IRON) 200 (65 Fe) MG TABS Take 1 tablet by mouth daily. 30 tablet    furosemide (LASIX) 20 MG tablet Take 1 tablet (20 mg total) by mouth daily. 90 tablet 1   hydrochlorothiazide (HYDRODIURIL) 50 MG tablet Take 1 tablet (50 mg total) by mouth daily. 90 tablet 1   levothyroxine (SYNTHROID) 175 MCG tablet TAKE 1 TABLET BY MOUTH ONCE DAILY BEFORE BREAKFAST. APPOINTMENT REQUIRED FOR FUTURE REFILLS 90 tablet 1   lidocaine (XYLOCAINE) 5 % ointment Apply 1 Application topically as needed. 50 g 0   losartan (COZAAR) 100 MG tablet TAKE 1 TABLET BY MOUTH ONCE DAILY . APPOINTMENT REQUIRED FOR FUTURE REFILLS 90 tablet 1   mirabegron ER (MYRBETRIQ) 25 MG TB24 tablet Take 1 tablet (25 mg total) by mouth daily. 14 tablet 0   Multiple Vitamins-Minerals (ONE-A-DAY MENOPAUSE FORMULA) TABS Take 1 tablet by mouth daily.     naproxen sodium (ALEVE) 220 MG tablet Take 1 tablet (220 mg total) by mouth 2 (two) times daily as needed. (Patient taking differently: Take 440 mg by mouth daily as needed.)     OVER THE COUNTER MEDICATION OTC antacid as needed (cannot recall name)     vitamin B-12 (CYANOCOBALAMIN) 500 MCG tablet Take 500 mcg by mouth daily.     No facility-administered medications prior to visit.     Review of Systems:   Constitutional: No weight loss or gain, night sweats, fevers, chills, fatigue, or lassitude. HEENT: No headaches, difficulty swallowing, tooth/dental problems, or sore throat. No sneezing, itching, ear ache, nasal congestion, or post nasal drip CV:  No chest pain, orthopnea, PND, swelling in lower extremities, anasarca, dizziness, palpitations, syncope Resp:  +shortness of breath with exertion; occasional wheezing. No excess mucus or change in color of mucus. No productive or non-productive. No hemoptysis. No chest wall deformity GI:  No heartburn, indigestion, abdominal pain, nausea, vomiting, diarrhea,  loss of appetite GU: No dysuria, change in color of urine, urgency or frequency.   Skin: No rash, lesions, ulcerations MSK:  No joint pain or swelling.   Neuro: No dizziness or lightheadedness.  Psych: No depression or anxiety. Mood stable.     Physical Exam:  BP 120/72 (BP Location: Right Arm, Patient Position: Sitting, Cuff Size: Large)   Pulse 94   Temp 98 F (36.7 C) (Oral)   Ht 5' (1.524 m)   Wt  277 lb (125.6 kg)   SpO2 97%   BMI 54.10 kg/m   GEN: Pleasant, interactive, well-appearing; morbidly obese; in no acute distress. HEENT:  Normocephalic and atraumatic. PERRLA. Sclera white. Nasal turbinates pink, moist and patent bilaterally. No rhinorrhea present. Oropharynx pink and moist, without exudate or edema. No lesions, ulcerations, or postnasal drip. Mallampati III NECK:  Supple w/ fair ROM. No JVD present. Normal carotid impulses w/o bruits. Thyroid symmetrical with no goiter or nodules palpated. No lymphadenopathy.   CV: RRR, no m/r/g, no peripheral edema. Pulses intact, +2 bilaterally. No cyanosis, pallor or clubbing. PULMONARY:  Unlabored, regular breathing. Clear bilaterally A&P w/o wheezes/rales/rhonchi. No accessory muscle use.  GI: BS present and normoactive. Soft, non-tender to palpation. No organomegaly or masses detected. MSK: No erythema, warmth or tenderness. Cap refil <2 sec all extrem. No deformities or joint swelling noted.  Neuro: A/Ox3. No focal deficits noted.   Skin: Warm, no lesions or rashe Psych: Normal affect and behavior. Judgement and thought content appropriate.     Lab Results:  CBC    Component Value Date/Time   WBC 7.0 03/28/2021 1037   RBC 3.80 (L) 03/28/2021 1037   HGB 12.1 03/28/2021 1037    HCT 36.2 03/28/2021 1037   PLT 190.0 03/28/2021 1037   MCV 95.2 03/28/2021 1037   MCHC 33.5 03/28/2021 1037   RDW 13.2 03/28/2021 1037   LYMPHSABS 2.0 03/28/2021 1037   MONOABS 0.5 03/28/2021 1037   EOSABS 0.2 03/28/2021 1037   BASOSABS 0.0 03/28/2021 1037    BMET    Component Value Date/Time   NA 138 02/27/2022 0758   K 4.2 02/27/2022 0758   CL 101 02/27/2022 0758   CO2 31 02/27/2022 0758   GLUCOSE 96 02/27/2022 0758   BUN 21 02/27/2022 0758   CREATININE 0.84 02/27/2022 0758   CALCIUM 9.7 02/27/2022 0758    BNP No results found for: "BNP"   Imaging:  No results found.        No data to display          No results found for: "NITRICOXIDE"      Assessment & Plan:   OSA on CPAP Severe OSA managed on CPAP. She has excellent compliance and control with residual AHI 0.3. She is having some leaks from her mask; wearing an old full face mask. Suspect that her leakage is due to poor mask fit. We will send orders for new supplies for her. She will let us know if problem persists afterwards. She also has an older machine. She's unsure if she could afford a new one at this point but would like to see. Orders placed for new machine today - auto 7-12 cmH2O.  Patient Instructions  Continue to use CPAP every night, minimum of 4-6 hours a night.  Change equipment every 30 days or as directed by DME. Wash your tubing with warm soap and water daily, hang to dry. Wash humidifier portion weekly.  Be aware of reduced alertness and do not drive or operate heavy machinery if experiencing this or drowsiness.  Exercise encouraged, as tolerated. Notify if persistent daytime sleepiness occurs even with consistent use of CPAP.  Orders placed for supplies. Let me know if the leaks persist once you get a new mask   Trial Albuterol 2 puffs every 6 hours as needed for shortness of breath or wheezing  Pulmonary function testing ordered   Follow up with Dr. Elsworth Soho or Roxan Diesel, NP  after PFTs. If symptoms do not  improve or worsen, please contact office for sooner follow up or seek emergency care.    Dyspnea on exertion Possibly multifactorial and related to obesity and physical deconditioning. No significant pulmonary history and never smoker. Lung exam clear today. We will obtain PFTs for further evaluation. Provided with trial of albuterol in interim. Instructed not to use day of PFT. Will consider further workup depending on results.    I spent 35 minutes of dedicated to the care of this patient on the date of this encounter to include pre-visit review of records, face-to-face time with the patient discussing conditions above, post visit ordering of testing, clinical documentation with the electronic health record, making appropriate referrals as documented, and communicating necessary findings to members of the patients care team.  Clayton Bibles, NP 04/26/2022  Pt aware and understands NP's role.

## 2022-04-26 NOTE — Assessment & Plan Note (Signed)
Severe OSA managed on CPAP. She has excellent compliance and control with residual AHI 0.3. She is having some leaks from her mask; wearing an old full face mask. Suspect that her leakage is due to poor mask fit. We will send orders for new supplies for her. She will let us know if problem persists afterwards. She also has an older machine. She's unsure if she could afford a new one at this point but would like to see. Orders placed for new machine today - auto 7-12 cmH2O.  Patient Instructions  Continue to use CPAP every night, minimum of 4-6 hours a night.  Change equipment every 30 days or as directed by DME. Wash your tubing with warm soap and water daily, hang to dry. Wash humidifier portion weekly.  Be aware of reduced alertness and do not drive or operate heavy machinery if experiencing this or drowsiness.  Exercise encouraged, as tolerated. Notify if persistent daytime sleepiness occurs even with consistent use of CPAP.  Orders placed for supplies. Let me know if the leaks persist once you get a new mask   Trial Albuterol 2 puffs every 6 hours as needed for shortness of breath or wheezing  Pulmonary function testing ordered   Follow up with Dr. Elsworth Soho or Roxan Diesel, NP after PFTs. If symptoms do not improve or worsen, please contact office for sooner follow up or seek emergency care.

## 2022-04-26 NOTE — Progress Notes (Signed)
[name], [name] ([id number]) [id number] Page 1 of 11 Visit Report for 04/26/2022 Chief Complaint Document Details Patient Name: Date of Service: [place], [name] 04/26/2022 7:45 A M Medical Record Number: [id number] Patient Account Number: [id number] Date of Birth/Sex: Treating RN: [birth year] (62 y.o. F) Primary Care Provider: Loralyn Freshwater Other Clinician: Referring Provider: Treating Provider/Extender: Chesley Noon in Treatment: 13 Information Obtained from: Patient Chief Complaint RLE ulcer Electronic Signature(s) Signed: 04/26/2022 8:15:09 AM By: Fredirick Maudlin MD FACS Entered By: Fredirick Maudlin on 04/26/2022 08:15:09 -------------------------------------------------------------------------------- Debridement Details Patient Name: Date of Service: [name], [name] 04/26/2022 7:45 A M Medical Record Number: [id number] Patient Account Number: [id number] Date of Birth/Sex: Treating RN: April 09, 1960 (62 y.o. [name], [name] Primary Care Provider: Loralyn Freshwater Other Clinician: Referring Provider: Treating Provider/Extender: Chesley Noon in Treatment: 13 Debridement Performed for Assessment: Wound #2 Right,Posterior Lower Leg Performed By: Physician Fredirick Maudlin, MD Debridement Type: Debridement Severity of Tissue Pre Debridement: Fat layer exposed Level of Consciousness (Pre-procedure): Awake and Alert Pre-procedure Verification/Time Out Yes - 08:07 Taken: Start Time: 08:08 Pain Control: Lidocaine 5% topical ointment T Area Debrided (L x W): otal 2.2 (cm) x 2 (cm) = 4.4 (cm) Tissue and other material debrided: Non-Viable, Slough, Biofilm, Slough Level: Non-Viable Tissue Debridement Description: Selective/Open Wound Instrument: Curette Bleeding: Minimum Hemostasis Achieved: Pressure Response to Treatment: Procedure was tolerated well Level of Consciousness (Post- Awake and  Alert procedure): Post Debridement Measurements of Total Wound Length: (cm) 2.2 Width: (cm) 2 Depth: (cm) 0.2 Volume: (cm) 0.691 Character of Wound/Ulcer Post Debridement: Improved Severity of Tissue Post Debridement: Fat layer exposed Post Procedure Diagnosis Same as [name], [name] ([id number]) [id number] Page 2 of 11 Notes Scribed for Dr. Celine Ahr by [name], RN Electronic Signature(s) Signed: 04/26/2022 9:17:32 AM By: Fredirick Maudlin MD FACS Signed: 04/26/2022 4:11:32 PM By: [name] RN Entered By: [name] on 04/26/2022 08:10:14 -------------------------------------------------------------------------------- HPI Details Patient Name: Date of Service: [name], [name] 04/26/2022 7:45 A M Medical Record Number: [id number] Patient Account Number: [id number] Date of Birth/Sex: Treating RN: 10-30-1960 (62 y.o. F) Primary Care Provider: Loralyn Freshwater Other Clinician: Referring Provider: Treating Provider/Extender: Chesley Noon in Treatment: 13 History of Present Illness HPI Description: 02/16/2020 upon evaluation today patient actually appears to be doing poorly in regard to her left medial lower extremity ulcer. This is actually an area that she tells me she has had intermittent issues with over the years although has been closed for some time she typically uses compression right now she has juxta lite compression wraps. With that being said she tells me that this nonetheless open several weeks/months ago and has been given her trouble since. She does have a history of chronic venous insufficiency she is seeing specialist for this in the past she has had an ablation as well as sclerotherapy. With that being said she also has hypertension chronically which is managed by her primary care provider. In general she seems to be worsening overall with regard to the wound and states that she finally  realized that she needed to come in and have somebody look at this and not continue to try to manage this on her own. No fevers, chills, nausea, vomiting, or diarrhea. 02/23/2020 on evaluation today patient appears to be doing well with regard to her wound. This is showing some signs of improvement which is great news still were not quite at the point where I would  like to be as far as the overall appearance of the wound is concerned but I do believe this is better than last week. I do believe the Iodoflex is helping as well. 03/08/2020 upon evaluation today patient appears to be doing well with regard to her wound. She has been tolerating the dressing changes without complication. Fortunately I feel like she has made great progress with the Iodoflex but I feel like it may be the point rest to switch to something else possibly a collagen- based dressing at this time. 03/15/2020 upon evaluation today patient appears to be doing excellent in regard to her leg ulcer. She has been tolerating the dressing changes without complication. Fortunately there is no signs of active infection. Overall she is measuring a little bit smaller today which is great news. 03/22/2020 upon evaluation today patient appears to be doing well with regard to her wound. She has been tolerating the dressing changes without complication. Fortunately there is no signs of active infection at this time. 03/28/2020; patient I do not usually see however she has a wound on the left anterior lower leg secondary to chronic venous insufficiency we have been using silver collagen under compression. She arrives in clinic with a nonviable surface requiring debridement 04/12/2020 upon evaluation today patient appears to be doing well all things considered with regard to her leg ulcer. She is tolerating the dressing changes without complication there is minimal dry skin around the edges of the wound that may be trapping and stopping some of the  events of the new skin I am can work on that today. Otherwise the surface of the wound appears to be doing excellent. 04/19/2020 upon evaluation today patient appears to be doing well with regard to her leg ulcer. She has been tolerating dressing changes without complication. Fortunately there is no signs of active infection at this time. No fever chills noted overall very pleased with how things seem to be progressing. 04/26/2020 on evaluation today patient appears to be doing well with regard to her wound currently. Is showing signs of excellent improvement overall is filling in nicely and there does not appear to be any signs of infection. No fevers, chills, nausea, vomiting, or diarrhea. 05/03/2020 upon evaluation today patient appears to be doing well with regard to her wound on the leg. This overall showing signs of good improvement which is great she has some good epithelial growth and overall I think that things are moving in the correct direction. We likewise going to continue with the wound care measures as before since she seems to making such good improvement. 2/3; venous wound on the left medial leg. This is contracting. We are using Prisma and 3 layer compression. She has a stocking and waiting in the eventuality this heals. She is already using it on the right 05/17/2020 upon evaluation today patient appears to be doing well with regard to her leg ulcer. She has been tolerating the dressing changes without complication. Fortunately there is no signs of active infection which is great news and overall very pleased with where things stand today. No fevers, chills, nausea, vomiting, or diarrhea. 05/24/2020 upon evaluation today patient appears to be doing well with regard to her wound. Overall I feel like she is making excellent progress. There does not appear to be any signs of active infection which is great news. 05/31/2020 upon evaluation today patient appears to be doing well with regard to  her wound. There does not appear to be any signs of  active infection which is great news overall I am extremely pleased with where things stand today. READMISSION 01/23/2022 She returns to clinic today with a new wound on her right posterior calf. She says that she was cleaning out an old shed near the middle of August this year and then noticed what seemed to be a bug bite on her right posterior calf. It was itchy, red, and raised. By the end of September, an ulcer had developed. She has been applying various topical creams such as hydrocortisone and others to the site. When it was not improving, she made an appointment in the wound care KAILANY, WACHTEL ([id number]) [id number] Page 3 of 11 center. ABI in clinic today was 0.97. On her right posterior calf, there is a circular wound with necrotic fat and black eschar present. There is no purulent drainage or malodor. The periwound skin is in good condition with just a little induration that appears to be secondary to inflammation. 01/31/2022: The wound measures a little bit larger today, but overall is quite a bit cleaner. There is some undermining from 9:00 to 3:00. She is having some periwound itching and says that her wrap slid. 02/08/2022: Despite using an Unna boot first layer at the top of her wrap, it slid again and it looks like there is been some bruising at the wound site. The wound is about the same size in terms of dimensions. There is a fair amount of slough and nonviable tissue still present. Edema control is better than last week. 02/15/2022: The wound dimensions are about the same. There is less nonviable tissue present. The periwound erythema has improved. 02/22/2022: The wound has deteriorated over the past week. It is larger and the periwound is more edematous, erythematous, and indurated. It looks as though there has been more tissue breakdown with undermining present. She is having more pain. There is  a foul odor coming from the wound. 02/26/2022: The wound looks quite a bit better today and the odor is gone. I still do not have her culture data back, but she seems to be responding well to the Augmentin. 03/06/2022: Her wound continues to improve. There is still some slough accumulation, but the periwound skin is less inflamed. Her culture returned with a polymicrobial population including Pseudomonas and so levofloxacin was also prescribed. She did not understand why a second antibiotic was being added so she has not yet initiated this. 03/14/2022: The wound is about the same, to perhaps a little bit larger. There is a fair amount of slough accumulation on the surface, as well as some hypertrophic granulation tissue. She is still taking levofloxacin, but has completed taking Augmentin. She has her [employer] compound with her today. 12/14; the patient has a significant circular wound on the posterior right calf. She is using Keystone and silver alginate under 3 layer compression. Her ABIs were within normal limits at 0.97. This may have been traumatic or an insect bite at the start I reviewed these records. 04/04/2022: Since I last saw the wound, it has contracted considerably. There is a fairly thick layer of slough on the surface, as it has not been debrided since the last time I did it. Edema control is excellent and the periwound skin is in much better condition. 04/12/2022: No significant change in the wound dimensions. It is filling with granulation tissue. Still with slough accumulation on the surface. 04/19/2022: The wound is smaller this week. There is some slough accumulation on the surface. 04/26/2022: The wound is  smaller again this week and significantly cleaner. The wound surface is a little bit drier than ideal. Electronic Signature(s) Signed: 04/26/2022 8:15:39 AM By: Fredirick Maudlin MD FACS Entered By: Fredirick Maudlin on 04/26/2022  08:15:39 -------------------------------------------------------------------------------- Physical Exam Details Patient Name: Date of Service: [name], [name] 04/26/2022 7:45 A M Medical Record Number: [id number] Patient Account Number: [id number] Date of Birth/Sex: Treating RN: January 23, 1961 (62 y.o. F) Primary Care Provider: Loralyn Freshwater Other Clinician: Referring Provider: Treating Provider/Extender: Chesley Noon in Treatment: 13 Constitutional . . . . no acute distress. Respiratory Normal work of breathing on room air. Notes 04/26/2022: The wound is smaller again this week and significantly cleaner. The wound surface is a little bit drier than ideal. Electronic Signature(s) Signed: 04/26/2022 8:16:09 AM By: Fredirick Maudlin MD FACS Entered By: Fredirick Maudlin on 04/26/2022 08:16:09 Physician Orders Details -------------------------------------------------------------------------------- [name] ([id number]) [id number] Page 4 of 11 Patient Name: Date of Service: [place], [name] 04/26/2022 7:45 A M Medical Record Number: [id number] Patient Account Number: [id number] Date of Birth/Sex: Treating RN: [birth year] (62 y.o. [name] Primary Care Provider: Loralyn Freshwater Other Clinician: Referring Provider: Treating Provider/Extender: Chesley Noon in Treatment: 13 Verbal / Phone Orders: No Diagnosis Coding ICD-10 Coding Code Description [phone] Non-pressure chronic ulcer of right calf with fat layer exposed E66.01 Morbid (severe) obesity due to excess calories I10 Essential (primary) hypertension R60.0 Localized edema Follow-up Appointments ppointment in 1 week. - Dr. Celine Ahr - Room 2 Return A Anesthetic (In clinic) Topical Lidocaine 5% applied to wound bed Bathing/ Shower/ Hygiene May shower with protection but do not get wound dressing(s) wet. Protect dressing(s) with  water repellant cover (for example, large plastic bag) or a cast cover and may then take shower. Edema Control - Lymphedema / SCD / Other Bilateral Lower Extremities Avoid standing for long periods of time. Exercise regularly Moisturize legs daily. Compression stocking or Garment 20-30 mm/Hg pressure to: - left leg daily. Apply first thing in the morning, remove at night. Wound Treatment Wound #2 - Lower Leg Wound Laterality: Right, Posterior Cleanser: Soap and Water 1 x Per Week/30 Days Discharge Instructions: May shower and wash wound with dial antibacterial soap and water prior to dressing change. Cleanser: Wound Cleanser 1 x Per Week/30 Days Discharge Instructions: Cleanse the wound with wound cleanser prior to applying a clean dressing using gauze sponges, not tissue or cotton balls. Peri-Wound Care: Triamcinolone 15 (g) 1 x Per Week/30 Days Discharge Instructions: Use triamcinolone 15 (g) as directed Peri-Wound Care: Sween Lotion (Moisturizing lotion) 1 x Per Week/30 Days Discharge Instructions: Apply moisturizing lotion as directed Topical: Skintegrity Hydrogel 4 (oz) 1 x Per Week/30 Days Discharge Instructions: Apply hydrogel as directed Topical: keystone 1 x Per Week/30 Days Prim Dressing: Promogran Prisma Matrix, 4.34 (sq in) (silver collagen) 1 x Per Week/30 Days ary Discharge Instructions: Moisten collagen with saline or hydrogel Secondary Dressing: Woven Gauze Sponge, Non-Sterile 4x4 in 1 x Per Week/30 Days Discharge Instructions: Apply over primary dressing as directed. Secondary Dressing: Zetuvit Plus 4x8 in 1 x Per Week/30 Days Discharge Instructions: Apply over primary dressing as directed. Compression Wrap: ThreePress (3 layer compression wrap) 1 x Per Week/30 Days Discharge Instructions: Apply three layer compression as directed. Compression Wrap: Unnaboot w/Calamine, 4x10 (in/yd) 1 x Per Week/30 Days Discharge Instructions: Apply Unnaboot at top of leg Electronic  Signature(s) Signed: 04/26/2022 9:17:32 AM By: Fredirick Maudlin MD FACS Entered By: Fredirick Maudlin on 04/26/2022 08:16:31 Genest,  Lillia Abed ([id number]) [id number] Page 5 of 11 -------------------------------------------------------------------------------- Problem List Details Patient Name: Date of Service: [name] 04/26/2022 7:45 A M Medical Record Number: [id number] Patient Account Number: [id number] Date of Birth/Sex: Treating RN: 10-29-1960 (62 y.o. F) Primary Care Provider: Loralyn Freshwater Other Clinician: Referring Provider: Treating Provider/Extender: Chesley Noon in Treatment: 13 Active Problems ICD-10 Encounter Code Description Active Date MDM Diagnosis L97.212 Non-pressure chronic ulcer of right calf with fat layer exposed 01/23/2022 No Yes E66.01 Morbid (severe) obesity due to excess calories 01/23/2022 No Yes I10 Essential (primary) hypertension 01/23/2022 No Yes R60.0 Localized edema 01/23/2022 No Yes Inactive Problems Resolved Problems Electronic Signature(s) Signed: 04/26/2022 8:13:50 AM By: Fredirick Maudlin MD FACS Entered By: Fredirick Maudlin on 04/26/2022 08:13:50 -------------------------------------------------------------------------------- Progress Note Details Patient Name: Date of Service: [name], [name] 04/26/2022 7:45 A M Medical Record Number: [id number] Patient Account Number: [id number] Date of Birth/Sex: Treating RN: 1960-07-30 (62 y.o. F) Primary Care Provider: Loralyn Freshwater Other Clinician: Referring Provider: Treating Provider/Extender: Chesley Noon in Treatment: 13 Subjective Chief Complaint Information obtained from Patient RLE ulcer History of Present Illness (HPI) 02/16/2020 upon evaluation today patient actually appears to be doing poorly in regard to her left medial lower extremity ulcer. This is actually an area that she tells me  she has had intermittent issues with over the years although has been closed for some time she typically uses compression right now she has juxta lite compression wraps. With that being said she tells me that this nonetheless open several weeks/months ago and has been given her trouble since. She does have a history of chronic venous insufficiency she is seeing specialist for this in the past she has had an ablation as well as sclerotherapy. With that being said she also has hypertension chronically which is managed by her primary care provider. In general she seems to be worsening overall with regard to the SOLEIA, BEZOLD ([id number]) [id number] Page 6 of 11 wound and states that she finally realized that she needed to come in and have somebody look at this and not continue to try to manage this on her own. No fevers, chills, nausea, vomiting, or diarrhea. 02/23/2020 on evaluation today patient appears to be doing well with regard to her wound. This is showing some signs of improvement which is great news still were not quite at the point where I would like to be as far as the overall appearance of the wound is concerned but I do believe this is better than last week. I do believe the Iodoflex is helping as well. 03/08/2020 upon evaluation today patient appears to be doing well with regard to her wound. She has been tolerating the dressing changes without complication. Fortunately I feel like she has made great progress with the Iodoflex but I feel like it may be the point rest to switch to something else possibly a collagen- based dressing at this time. 03/15/2020 upon evaluation today patient appears to be doing excellent in regard to her leg ulcer. She has been tolerating the dressing changes without complication. Fortunately there is no signs of active infection. Overall she is measuring a little bit smaller today which is great news. 03/22/2020 upon evaluation today  patient appears to be doing well with regard to her wound. She has been tolerating the dressing changes without complication. Fortunately there is no signs of active infection at this time. 03/28/2020; patient I do not usually see however she  has a wound on the left anterior lower leg secondary to chronic venous insufficiency we have been using silver collagen under compression. She arrives in clinic with a nonviable surface requiring debridement 04/12/2020 upon evaluation today patient appears to be doing well all things considered with regard to her leg ulcer. She is tolerating the dressing changes without complication there is minimal dry skin around the edges of the wound that may be trapping and stopping some of the events of the new skin I am can work on that today. Otherwise the surface of the wound appears to be doing excellent. 04/19/2020 upon evaluation today patient appears to be doing well with regard to her leg ulcer. She has been tolerating dressing changes without complication. Fortunately there is no signs of active infection at this time. No fever chills noted overall very pleased with how things seem to be progressing. 04/26/2020 on evaluation today patient appears to be doing well with regard to her wound currently. Is showing signs of excellent improvement overall is filling in nicely and there does not appear to be any signs of infection. No fevers, chills, nausea, vomiting, or diarrhea. 05/03/2020 upon evaluation today patient appears to be doing well with regard to her wound on the leg. This overall showing signs of good improvement which is great she has some good epithelial growth and overall I think that things are moving in the correct direction. We likewise going to continue with the wound care measures as before since she seems to making such good improvement. 2/3; venous wound on the left medial leg. This is contracting. We are using Prisma and 3 layer compression. She has a  stocking and waiting in the eventuality this heals. She is already using it on the right 05/17/2020 upon evaluation today patient appears to be doing well with regard to her leg ulcer. She has been tolerating the dressing changes without complication. Fortunately there is no signs of active infection which is great news and overall very pleased with where things stand today. No fevers, chills, nausea, vomiting, or diarrhea. 05/24/2020 upon evaluation today patient appears to be doing well with regard to her wound. Overall I feel like she is making excellent progress. There does not appear to be any signs of active infection which is great news. 05/31/2020 upon evaluation today patient appears to be doing well with regard to her wound. There does not appear to be any signs of active infection which is great news overall I am extremely pleased with where things stand today. READMISSION 01/23/2022 She returns to clinic today with a new wound on her right posterior calf. She says that she was cleaning out an old shed near the middle of August this year and then noticed what seemed to be a bug bite on her right posterior calf. It was itchy, red, and raised. By the end of September, an ulcer had developed. She has been applying various topical creams such as hydrocortisone and others to the site. When it was not improving, she made an appointment in the wound care center. ABI in clinic today was 0.97. On her right posterior calf, there is a circular wound with necrotic fat and black eschar present. There is no purulent drainage or malodor. The periwound skin is in good condition with just a little induration that appears to be secondary to inflammation. 01/31/2022: The wound measures a little bit larger today, but overall is quite a bit cleaner. There is some undermining from 9:00 to 3:00. She is  having some periwound itching and says that her wrap slid. 02/08/2022: Despite using an Unna boot first layer at  the top of her wrap, it slid again and it looks like there is been some bruising at the wound site. The wound is about the same size in terms of dimensions. There is a fair amount of slough and nonviable tissue still present. Edema control is better than last week. 02/15/2022: The wound dimensions are about the same. There is less nonviable tissue present. The periwound erythema has improved. 02/22/2022: The wound has deteriorated over the past week. It is larger and the periwound is more edematous, erythematous, and indurated. It looks as though there has been more tissue breakdown with undermining present. She is having more pain. There is a foul odor coming from the wound. 02/26/2022: The wound looks quite a bit better today and the odor is gone. I still do not have her culture data back, but she seems to be responding well to the Augmentin. 03/06/2022: Her wound continues to improve. There is still some slough accumulation, but the periwound skin is less inflamed. Her culture returned with a polymicrobial population including Pseudomonas and so levofloxacin was also prescribed. She did not understand why a second antibiotic was being added so she has not yet initiated this. 03/14/2022: The wound is about the same, to perhaps a little bit larger. There is a fair amount of slough accumulation on the surface, as well as some hypertrophic granulation tissue. She is still taking levofloxacin, but has completed taking Augmentin. She has her [employer] compound with her today. 12/14; the patient has a significant circular wound on the posterior right calf. She is using Keystone and silver alginate under 3 layer compression. Her ABIs were within normal limits at 0.97. This may have been traumatic or an insect bite at the start I reviewed these records. 04/04/2022: Since I last saw the wound, it has contracted considerably. There is a fairly thick layer of slough on the surface, as it has not been debrided  since the last time I did it. Edema control is excellent and the periwound skin is in much better condition. 04/12/2022: No significant change in the wound dimensions. It is filling with granulation tissue. Still with slough accumulation on the surface. 04/19/2022: The wound is smaller this week. There is some slough accumulation on the surface. 04/26/2022: The wound is smaller again this week and significantly cleaner. The wound surface is a little bit drier than ideal. Patient History Information obtained from Patient. Family History Cancer - Mother, Diabetes - Father, Hypertension - Mother, Kidney Disease - Mother, Lung Disease - Father, Thyroid Problems - Paternal SELENE, HOLLINGER ([id number]) [id number] Page 7 of 11 No family history of Heart Disease, Seizures, Stroke, Tuberculosis. Social History Never smoker, Marital Status - Married, Alcohol Use - Never, Drug Use - No History, Caffeine Use - Daily - coffee. Medical History Eyes Denies history of Cataracts, Glaucoma, Optic Neuritis Ear/Nose/Mouth/Throat Denies history of Chronic sinus problems/congestion, Middle ear problems Hematologic/Lymphatic Patient has history of Anemia - iron Denies history of Hemophilia, Human Immunodeficiency Virus, Lymphedema, Sickle Cell Disease Respiratory Patient has history of Sleep Apnea - CPAP Denies history of Aspiration, Asthma, Chronic Obstructive Pulmonary Disease (COPD), Pneumothorax, Tuberculosis Cardiovascular Patient has history of Hypertension, Peripheral Venous Disease Denies history of Angina, Arrhythmia, Congestive Heart Failure, Coronary Artery Disease, Deep Vein Thrombosis, Hypotension, Myocardial Infarction, Peripheral Arterial Disease, Phlebitis, Vasculitis Gastrointestinal Denies history of Cirrhosis , Colitis, Crohnoos, Hepatitis A, Hepatitis B,  Hepatitis C Endocrine Denies history of Type I Diabetes, Type II  Diabetes Genitourinary Denies history of End Stage Renal Disease Immunological Denies history of Lupus Erythematosus, Raynaudoos, Scleroderma Integumentary (Skin) Denies history of History of Burn Musculoskeletal Denies history of Gout, Rheumatoid Arthritis, Osteoarthritis, Osteomyelitis Neurologic Denies history of Dementia, Neuropathy, Quadriplegia, Paraplegia, Seizure Disorder Oncologic Denies history of Received Chemotherapy, Received Radiation Psychiatric Denies history of Anorexia/bulimia, Confinement Anxiety Hospitalization/Surgery History - cholecystectomy 1980s. - nephrolithasis 1980s. - plate and rod in right elbow surgery 2010. 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 Vitals Time Taken: 7:50 AM, Temperature: 98.4 F, Pulse: 90 bpm, Respiratory Rate: 18 breaths/min, Blood Pressure: 125/74 mmHg. Respiratory Normal work of breathing on room air. General Notes: 04/26/2022: The wound is smaller again this week and significantly cleaner. The wound surface is a little bit drier than ideal. Integumentary (Hair, Skin) Wound #2 status is Open. Original cause of wound was Insect Bite. The date acquired was: 11/21/2021. The wound has been in treatment 13 weeks. The wound is located on the Right,Posterior Lower Leg. The wound measures 2.2cm length x 2cm width x 0.2cm depth; 3.456cm^2 area and 0.691cm^3 volume. There is Fat Layer (Subcutaneous Tissue) exposed. There is no tunneling or undermining noted. There is a medium amount of serosanguineous drainage noted. The wound margin is distinct with the outline attached to the wound base. There is small (1-33%) red, friable granulation within the wound bed. There is a large (67- 100%) amount of necrotic tissue within the wound bed including Adherent Slough. The periwound skin appearance had no abnormalities noted for  moisture. The periwound skin appearance exhibited: Scarring, Hemosiderin Staining, Erythema. The periwound skin appearance did not exhibit: Excoriation, Atrophie Blanche, Ecchymosis, Rubor. The surrounding wound skin color is noted with erythema. Periwound temperature was noted as No Abnormality. Assessment Active Problems ICD-10 Non-pressure chronic ulcer of right calf with fat layer exposed Morbid (severe) obesity due to excess calories RILEY, PAPIN ([id number]) [id number] Page 8 of 11 Essential (primary) hypertension Localized edema Procedures Wound #2 Pre-procedure diagnosis of Wound #2 is a Venous Leg Ulcer located on the Right,Posterior Lower Leg .Severity of Tissue Pre Debridement is: Fat layer exposed. There was a Selective/Open Wound Non-Viable Tissue Debridement with a total area of 4.4 sq cm performed by Fredirick Maudlin, MD. With the following instrument(s): Curette to remove Non-Viable tissue/material. Material removed includes Slough and Biofilm and after achieving pain control using Lidocaine 5% topical ointment. No specimens were taken. A time out was conducted at 08:07, prior to the start of the procedure. A Minimum amount of bleeding was controlled with Pressure. The procedure was tolerated well. Post Debridement Measurements: 2.2cm length x 2cm width x 0.2cm depth; 0.691cm^3 volume. Character of Wound/Ulcer Post Debridement is improved. Severity of Tissue Post Debridement is: Fat layer exposed. Post procedure Diagnosis Wound #2: Same as Pre-Procedure General Notes: Scribed for Dr. Celine Ahr by [name], RN. Pre-procedure diagnosis of Wound #2 is a Venous Leg Ulcer located on the Right,Posterior Lower Leg . There was a Three Layer Compression Therapy Procedure by [name], RN. Post procedure Diagnosis Wound #2: Same as Pre-Procedure Plan Follow-up Appointments: Return Appointment in 1 week. - Dr. Celine Ahr - Room 2 Anesthetic: (In  clinic) Topical Lidocaine 5% applied to wound bed Bathing/ Shower/ Hygiene: May shower with protection but do not get wound dressing(s) wet. Protect dressing(s) with water repellant cover (for example, large  plastic bag) or a cast cover and may then take shower. Edema Control - Lymphedema / SCD / Other: Avoid standing for long periods of time. Exercise regularly Moisturize legs daily. Compression stocking or Garment 20-30 mm/Hg pressure to: - left leg daily. Apply first thing in the morning, remove at night. WOUND #2: - Lower Leg Wound Laterality: Right, Posterior Cleanser: Soap and Water 1 x Per Week/30 Days Discharge Instructions: May shower and wash wound with dial antibacterial soap and water prior to dressing change. Cleanser: Wound Cleanser 1 x Per Week/30 Days Discharge Instructions: Cleanse the wound with wound cleanser prior to applying a clean dressing using gauze sponges, not tissue or cotton balls. Peri-Wound Care: Triamcinolone 15 (g) 1 x Per Week/30 Days Discharge Instructions: Use triamcinolone 15 (g) as directed Peri-Wound Care: Sween Lotion (Moisturizing lotion) 1 x Per Week/30 Days Discharge Instructions: Apply moisturizing lotion as directed Topical: Skintegrity Hydrogel 4 (oz) 1 x Per Week/30 Days Discharge Instructions: Apply hydrogel as directed Topical: keystone 1 x Per Week/30 Days Prim Dressing: Promogran Prisma Matrix, 4.34 (sq in) (silver collagen) 1 x Per Week/30 Days ary Discharge Instructions: Moisten collagen with saline or hydrogel Secondary Dressing: Woven Gauze Sponge, Non-Sterile 4x4 in 1 x Per Week/30 Days Discharge Instructions: Apply over primary dressing as directed. Secondary Dressing: Zetuvit Plus 4x8 in 1 x Per Week/30 Days Discharge Instructions: Apply over primary dressing as directed. Com pression Wrap: ThreePress (3 layer compression wrap) 1 x Per Week/30 Days Discharge Instructions: Apply three layer compression as directed. Com pression  Wrap: Unnaboot w/Calamine, 4x10 (in/yd) 1 x Per Week/30 Days Discharge Instructions: Apply Unnaboot at top of leg 04/26/2022: The wound is smaller again this week and significantly cleaner. The wound surface is a little bit drier than ideal. I used a curette to debride slough and biofilm from the wound. I am going to change her contact layer to Prisma silver collagen moistened with hydrogel. We will continue to use her Keystone topical antimicrobial compound and 3 layer compression. Follow-up in 1 week. Electronic Signature(s) Signed: 04/26/2022 8:17:12 AM By: [name] MD FACS Entered By: [name] on 04/26/2022 08:17:12 [name] ([id number]) [id number] Page 9 of 11 -------------------------------------------------------------------------------- HxROS Details Patient Name: Date of Service: [name] 04/26/2022 7:45 A M Medical Record Number: [id number] Patient Account Number: [id number] Date of Birth/Sex: Treating RN: 10/24/1960 (62 y.o. F) Primary Care Provider: Nira Conn Other Clinician: Referring Provider: Treating Provider/Extender: Andrey Campanile in Treatment: 13 Information Obtained From Patient Eyes Medical History: Negative for: Cataracts; Glaucoma; Optic Neuritis Ear/Nose/Mouth/Throat Medical History: Negative for: Chronic sinus problems/congestion; Middle ear problems Hematologic/Lymphatic Medical History: Positive for: Anemia - iron Negative for: Hemophilia; Human Immunodeficiency Virus; Lymphedema; Sickle Cell Disease Respiratory Medical History: Positive for: Sleep Apnea - CPAP Negative for: Aspiration; Asthma; Chronic Obstructive Pulmonary Disease (COPD); Pneumothorax; Tuberculosis Cardiovascular Medical History: Positive for: Hypertension; Peripheral Venous Disease Negative for: Angina; Arrhythmia; Congestive Heart Failure; Coronary Artery Disease; Deep Vein Thrombosis;  Hypotension; Myocardial Infarction; Peripheral Arterial Disease; Phlebitis; Vasculitis Gastrointestinal Medical History: Negative for: Cirrhosis ; Colitis; Crohns; Hepatitis A; Hepatitis B; Hepatitis C Endocrine Medical History: Negative for: Type I Diabetes; Type II Diabetes Genitourinary Medical History: Negative for: End Stage Renal Disease Past Medical History Notes: years ago kidney stones Immunological Medical History: Negative for: Lupus Erythematosus; Raynauds; Scleroderma Integumentary (Skin) Medical History: Negative for: History of Burn Musculoskeletal Medical History: Negative for: Gout; Rheumatoid Arthritis; Osteoarthritis; Osteomyelitis BIRGITTA, UHLIR ([id number]) [id number] Page 10 of  11 Neurologic Medical History: Negative for: Dementia; Neuropathy; Quadriplegia; Paraplegia; Seizure Disorder Oncologic Medical History: Negative for: Received Chemotherapy; Received Radiation Past Medical History Notes: skin Ca removed from back years ago Psychiatric Medical History: Negative for: Anorexia/bulimia; Confinement Anxiety Immunizations Pneumococcal Vaccine: Received Pneumococcal Vaccination: No Implantable Devices None Hospitalization / Surgery History Type of Hospitalization/Surgery cholecystectomy 1980s nephrolithasis 1980s plate and rod in right elbow surgery 2010 Family and Social History Cancer: Yes - Mother; Diabetes: Yes - Father; Heart Disease: No; Hypertension: Yes - Mother; Kidney Disease: Yes - Mother; Lung Disease: Yes - Father; Seizures: No; Stroke: No; Thyroid Problems: Yes - Paternal Grandparents; Tuberculosis: No; Never smoker; Marital Status - Married; Alcohol Use: Never; Drug Use: No History; Caffeine Use: Daily - coffee; Financial Concerns: No; Food, Clothing or Shelter Needs: No; Support System Lacking: No; Transportation Concerns: No Electronic Signature(s) Signed: 04/26/2022 9:17:32 AM By: Fredirick Maudlin  MD FACS Entered By: Fredirick Maudlin on 04/26/2022 08:15:46 -------------------------------------------------------------------------------- SuperBill Details Patient Name: Date of Service: [name], [name] 04/26/2022 Medical Record Number: [id number] Patient Account Number: [id number] Date of Birth/Sex: Treating RN: 09/20/1960 (62 y.o. F) Primary Care Provider: Loralyn Freshwater Other Clinician: Referring Provider: Treating Provider/Extender: Chesley Noon in Treatment: 13 Diagnosis Coding ICD-10 Codes Code Description [phone] Non-pressure chronic ulcer of right calf with fat layer exposed E66.01 Morbid (severe) obesity due to excess calories I10 Essential (primary) hypertension R60.0 Localized edema Facility Procedures : SHALISSA, WAHEED Code: [id number] MIHIKA DONOGHUE ([id number]) Description: [phone] - DEBRIDE WOUND 1ST 20 SQ CM OR < ICD-10 Diagnosis Description L97.212 Non-pressure chronic ulcer of right calf with fat layer exposed [phone] Modifier: [id number] Quantity: 1 27.pdf Page 11 of 11 Physician Procedures : CPT4 Code Description Modifier [id number] - WC PHYS LEVEL 4 - EST PT 25 ICD-10 Diagnosis Description [id number] Non-pressure chronic ulcer of right calf with fat layer exposed R60.0 Localized edema E66.01 Morbid (severe) obesity due to excess calories  I10 Essential (primary) hypertension Quantity: 1 : [id number] - WC PHYS DEBR WO ANESTH 20 SQ CM ICD-10 Diagnosis Description L97.212 Non-pressure chronic ulcer of right calf with fat layer exposed Quantity: 1 Electronic Signature(s) Signed: 04/26/2022 8:22:22 AM By: Fredirick Maudlin MD FACS Entered By: Fredirick Maudlin on 04/26/2022 [id number]

## 2022-05-03 ENCOUNTER — Encounter (HOSPITAL_BASED_OUTPATIENT_CLINIC_OR_DEPARTMENT_OTHER): Payer: BC Managed Care – PPO | Admitting: General Surgery

## 2022-05-03 DIAGNOSIS — L97212 Non-pressure chronic ulcer of right calf with fat layer exposed: Secondary | ICD-10-CM | POA: Diagnosis not present

## 2022-05-03 NOTE — Progress Notes (Signed)
ROBBIE, NANGLE ([id number]) [id number] Page 1 of 8 Visit Report for 05/03/2022 Arrival Information Details Patient Name: Date of Service: [place], [name] 05/03/2022 7:30 A M Medical Record Number: [id number] Patient Account Number: [id number] Date of Birth/Sex: Treating RN: 02-15-1961 (62 y.o. [name], [name] Primary Care Darenda Fike: Loralyn Freshwater Other Clinician: Referring Annalysse Shoemaker: Treating Arelly Whittenberg/Extender: Chesley Noon in Treatment: 14 Visit Information History Since Last Visit Added or deleted any medications: No Patient Arrived: Ambulatory Any new allergies or adverse reactions: No Arrival Time: 07:44 Had a fall or experienced change in No Accompanied By: self activities of daily living that may affect Transfer Assistance: None risk of falls: Patient Requires Transmission-Based Precautions: No Signs or symptoms of abuse/neglect since last visito No Patient Has Alerts: No Hospitalized since last visit: No Implantable device outside of the clinic excluding No cellular tissue based products placed in the center since last visit: Has Compression in Place as Prescribed: Yes Pain Present Now: No Electronic Signature(s) Signed: 05/03/2022 3:30:08 PM By: [name] RN Entered By: [name] on 05/03/2022 07:44:20 -------------------------------------------------------------------------------- Compression Therapy Details Patient Name: Date of Service: [name], [name] 05/03/2022 7:30 A M Medical Record Number: [id number] Patient Account Number: [id number] Date of Birth/Sex: Treating RN: 09/04/1960 (62 y.o. [name] Primary Care Prisma Decarlo: Loralyn Freshwater Other Clinician: Referring Cassey Hurrell: Treating Deantae Shackleton/Extender: Chesley Noon in Treatment: 14 Compression Therapy Performed for Wound Assessment: Wound #2 Right,Posterior Lower Leg Performed By: Clinician [name],  RN Compression Type: Three Layer Post Procedure Diagnosis Same as Pre-procedure Electronic Signature(s) Signed: 05/03/2022 3:30:08 PM By: [name] RN Entered By: [name] on 05/03/2022 08:01:46 [name] ([id number]) [id number] Page 2 of 8 -------------------------------------------------------------------------------- Encounter Discharge Information Details Patient Name: Date of Service: [place]. 05/03/2022 7:30 A M Medical Record Number: [id number] Patient Account Number: [id number] Date of Birth/Sex: Treating RN: 06-24-1960 (62 y.o. [name] Primary Care Avonelle Viveros: Loralyn Freshwater Other Clinician: Referring Lizzeth Meder: Treating Albirta Rhinehart/Extender: Chesley Noon in Treatment: 14 Encounter Discharge Information Items Post Procedure Vitals Discharge Condition: Stable Temperature (F): 98.2 Ambulatory Status: Ambulatory Pulse (bpm): 89 Discharge Destination: Home Respiratory Rate (breaths/min): 18 Transportation: Private Auto Blood Pressure (mmHg): 164/82 Accompanied By: self Schedule Follow-up Appointment: Yes Clinical Summary of Care: Electronic Signature(s) Signed: 05/03/2022 3:30:08 PM By: [name] RN Entered By: [name] on 05/03/2022 08:17:32 -------------------------------------------------------------------------------- Lower Extremity Assessment Details Patient Name: Date of Service: [place], [name] 05/03/2022 7:30 A M Medical Record Number: [id number] Patient Account Number: [id number] Date of Birth/Sex: Treating RN: 03-24-1961 (62 y.o. [name] Primary Care Jamail Cullers: Loralyn Freshwater Other Clinician: Referring Herbie Lehrmann: Treating Dhruva Orndoff/Extender: Chesley Noon in Treatment: 14 Edema Assessment Assessed: [name]: No] [name]: No] [Left: Edema] [Right: :] Calf Left: Right: Point of Measurement: From Medial Instep 43.4 cm Ankle Left:  Right: Point of Measurement: From Medial Instep 23.8 cm Vascular Assessment Pulses: Dorsalis Pedis Palpable: [Right:Yes] Electronic Signature(s) Signed: 05/03/2022 3:30:08 PM By: [name] RN Entered By: [name] on 05/03/2022 07:47:28 Multi Wound Chart Details -------------------------------------------------------------------------------- [name] ([id number]) [id number] Page 3 of 8 Patient Name: Date of Service: [place], [name] 05/03/2022 7:30 A M Medical Record Number: [id number] Patient Account Number: [id number] Date of Birth/Sex: Treating RN: [birth year] (62 y.o. F) Primary Care Shiara Mcgough: Loralyn Freshwater Other Clinician: Referring Estelene Carmack: Treating Mykah Shin/Extender: Chesley Noon in Treatment: 14 Vital Signs Height(in): Pulse(bpm): 89 Weight(lbs):  Blood Pressure(mmHg): 164/82 Body Mass Index(BMI): Temperature(F): 98.2 Respiratory Rate(breaths/min): 18 Wound Assessments Wound Number: 2 N/A N/A Photos: N/A N/A Right, Posterior Lower Leg N/A N/A Wound Location: Insect Bite N/A N/A Wounding Event: Venous Leg Ulcer N/A N/A Primary Etiology: Anemia, Sleep Apnea, Hypertension, N/A N/A Comorbid History: Peripheral Venous Disease 11/21/2021 N/A N/A Date Acquired: 14 N/A N/A Weeks of Treatment: Open N/A N/A Wound Status: No N/A N/A Wound Recurrence: 2.8x2.7x0.2 N/A N/A Measurements L x W x D (cm) 5.938 N/A N/A A (cm) : rea 1.188 N/A N/A Volume (cm) : -1699.40% N/A N/A % Reduction in A rea: -1100.00% N/A N/A % Reduction in Volume: 11 Starting Position 1 (o'clock): 2 Ending Position 1 (o'clock): 0.3 Maximum Distance 1 (cm): Yes N/A N/A Undermining: Full Thickness Without Exposed N/A N/A Classification: Support Structures Medium N/A N/A Exudate A mount: Serosanguineous N/A N/A Exudate Type: red, brown N/A N/A Exudate Color: Distinct, outline attached N/A N/A Wound  Margin: Small (1-33%) N/A N/A Granulation A mount: Red, Friable N/A N/A Granulation Quality: Large (67-100%) N/A N/A Necrotic A mount: Fat Layer (Subcutaneous Tissue): Yes N/A N/A Exposed Structures: Fascia: No Tendon: No Muscle: No Joint: No Bone: No Small (1-33%) N/A N/A Epithelialization: Debridement - Selective/Open Wound N/A N/A Debridement: Pre-procedure Verification/Time Out 08:00 N/A N/A Taken: Lidocaine 5% topical ointment N/A N/A Pain Control: Slough N/A N/A Tissue Debrided: Non-Viable Tissue N/A N/A Level: 7.56 N/A N/A Debridement A (sq cm): rea Curette N/A N/A Instrument: Minimum N/A N/A Bleeding: Pressure N/A N/A Hemostasis A chieved: Procedure was tolerated well N/A N/A Debridement Treatment Response: 2.8x2.7x0.2 N/A N/A Post Debridement Measurements L x W x D (cm) 1.188 N/A N/A Post Debridement Volume: (cm) Scarring: Yes N/A N/A Periwound Skin Texture: Excoriation: No No Abnormalities Noted N/A N/A Periwound Skin Moisture: Erythema: Yes N/A N/A Periwound Skin Color: Hemosiderin Staining: Yes Atrophie Blanche: No Ecchymosis: No Rubor: No No Abnormality N/A N/A TemperatureNANDANA, KROLIKOWSKI ([id number]) [id number] Page 4 of 8 Compression Therapy N/A N/A Procedures Performed: Debridement Treatment Notes Electronic Signature(s) Signed: 05/03/2022 8:07:24 AM By: Fredirick Maudlin MD FACS Entered By: Fredirick Maudlin on 05/03/2022 08:07:23 -------------------------------------------------------------------------------- Multi-Disciplinary Care Plan Details Patient Name: Date of Service: [place], [name] 05/03/2022 7:30 A M Medical Record Number: [id number] Patient Account Number: [id number] Date of Birth/Sex: Treating RN: 1960-09-28 (62 y.o. [name] Primary Care Ardith Test: Loralyn Freshwater Other Clinician: Referring Isabelly Kobler: Treating Natalyia Innes/Extender: Chesley Noon in  Treatment: [place] reviewed with physician Active Inactive Necrotic Tissue Nursing Diagnoses: Impaired tissue integrity related to necrotic/devitalized tissue Knowledge deficit related to management of necrotic/devitalized tissue Goals: Necrotic/devitalized tissue will be minimized in the wound bed Date Initiated: 01/23/2022 Target Resolution Date: 06/06/2022 Goal Status: Active Patient/caregiver will verbalize understanding of reason and process for debridement of necrotic tissue Date Initiated: 01/23/2022 Target Resolution Date: 06/06/2022 Goal Status: Active Interventions: Assess patient pain level pre-, during and post procedure and prior to discharge Provide education on necrotic tissue and debridement process Treatment Activities: Apply topical anesthetic as ordered : 01/23/2022 Notes: Wound/Skin Impairment Nursing Diagnoses: Impaired tissue integrity Knowledge deficit related to ulceration/compromised skin integrity Goals: Patient/caregiver will verbalize understanding of skin care regimen Date Initiated: 01/23/2022 Target Resolution Date: 06/06/2022 Goal Status: Active Interventions: Assess patient/caregiver ability to obtain necessary supplies Assess ulceration(s) every visit Treatment Activities: Skin care regimen initiated : 01/23/2022 Topical wound management initiated : 01/23/2022 Notes: CYDNI, REDDOCH ([id number]) [id number] Page 5 of 8 Electronic Signature(s) Signed: 05/03/2022 3:30:08  PM By: Tommie Ard RN Entered By: Tommie Ard on 05/03/2022 07:59:48 -------------------------------------------------------------------------------- Pain Assessment Details Patient Name: Date of Service: [place], [name] 05/03/2022 7:30 A M Medical Record Number: [id number] Patient Account Number: [id number] Date of Birth/Sex: Treating RN: December 08, 1960 (62 y.o. Kateri Mc Primary Care Marwah Disbro: Nira Conn  Other Clinician: Referring Loyce Klasen: Treating Yeilyn Gent/Extender: Andrey Campanile in Treatment: 14 Active Problems Location of Pain Severity and Description of Pain Patient Has Paino No Site Locations Rate the pain. Current Pain Level: 0 Pain Management and Medication Current Pain Management: Electronic Signature(s) Signed: 05/03/2022 3:30:08 PM By: Tommie Ard RN Entered By: Tommie Ard on 05/03/2022 07:44:40 -------------------------------------------------------------------------------- Patient/Caregiver Education Details Patient Name: Date of Service: [name], [name] 1/26/2024andnbsp7:30 A M Medical Record Number: [id number] Patient Account Number: [id number] Date of Birth/Gender: Treating RN: 07/19/1960 (62 y.o. Kateri Mc Primary Care Physician: Nira Conn Other Clinician: Referring Physician: Treating Physician/Extender: Andrey Campanile in Treatment: 14 Education Assessment Education Provided To: Patient GERA, INBODEN ([id number]) [id number] Page 6 of 8 Education Topics Provided Wound Debridement: Methods: Explain/Verbal Responses: Reinforcements needed, State content correctly Wound/Skin Impairment: Methods: Explain/Verbal Responses: Reinforcements needed, State content correctly Electronic Signature(s) Signed: 05/03/2022 3:30:08 PM By: Tommie Ard RN Entered By: Tommie Ard on 05/03/2022 08:00:03 -------------------------------------------------------------------------------- Wound Assessment Details Patient Name: Date of Service: [name], [name] 05/03/2022 7:30 A M Medical Record Number: [id number] Patient Account Number: [id number] Date of Birth/Sex: Treating RN: Jun 29, 1960 (62 y.o. [name], [name] Primary Care Elizeo Rodriques: Nira Conn Other Clinician: Referring Shalev Helminiak: Treating Derald Lorge/Extender: Andrey Campanile in Treatment:  14 Wound Status Wound Number: 2 Primary Venous Leg Ulcer Etiology: Wound Location: Right, Posterior Lower Leg Wound Status: Open Wounding Event: Insect Bite Comorbid Anemia, Sleep Apnea, Hypertension, Peripheral Venous Date Acquired: 11/21/2021 History: Disease Weeks Of Treatment: 14 Clustered Wound: No Photos Wound Measurements Length: (cm) 2.8 Width: (cm) 2.7 Depth: (cm) 0.2 Area: (cm) 5.938 Volume: (cm) 1.188 % Reduction in Area: -1699.4% % Reduction in Volume: -1100% Epithelialization: Small (1-33%) Tunneling: No Undermining: Yes Starting Position (o'clock): 11 Ending Position (o'clock): 2 Maximum Distance: (cm) 0.3 Wound Description Classification: Full Thickness Without Exposed Support Structures Wound Margin: Distinct, outline attached Exudate Amount: Medium Exudate Type: Serosanguineous Exudate Color: red, brown Foul Odor After Cleansing: No Slough/Fibrino Yes Wound Bed SHERETA, CROTHERS ([id number]) [id number] Page 7 of 8 Granulation Amount: Small (1-33%) Exposed Structure Granulation Quality: Red, Friable Fascia Exposed: No Necrotic Amount: Large (67-100%) Fat Layer (Subcutaneous Tissue) Exposed: Yes Necrotic Quality: Adherent Slough Tendon Exposed: No Muscle Exposed: No Joint Exposed: No Bone Exposed: No Periwound Skin Texture Texture Color No Abnormalities Noted: No No Abnormalities Noted: No Excoriation: No Atrophie Blanche: No Scarring: Yes Ecchymosis: No Erythema: Yes Moisture Hemosiderin Staining: Yes No Abnormalities Noted: Yes Rubor: No Temperature / Pain Temperature: No Abnormality Treatment Notes Wound #2 (Lower Leg) Wound Laterality: Right, Posterior Cleanser Soap and Water Discharge Instruction: May shower and wash wound with dial antibacterial soap and water prior to dressing change. Wound Cleanser Discharge Instruction: Cleanse the wound with wound cleanser prior to applying a clean dressing using  gauze sponges, not tissue or cotton balls. Peri-Wound Care Triamcinolone 15 (g) Discharge Instruction: Use triamcinolone 15 (g) as directed Sween Lotion (Moisturizing lotion) Discharge Instruction: Apply moisturizing lotion as directed Topical Skintegrity Hydrogel 4 (oz) Discharge Instruction: Apply hydrogel as directed keystone Primary Dressing Promogran Prisma Matrix, 4.34 (sq in) (silver collagen) Discharge Instruction: Moisten collagen with  saline or hydrogel Secondary Dressing Woven Gauze Sponge, Non-Sterile 4x4 in Discharge Instruction: Apply over primary dressing as directed. Zetuvit Plus 4x8 in Discharge Instruction: Apply over primary dressing as directed. Secured With Compression Wrap ThreePress (3 layer compression wrap) Discharge Instruction: Apply three layer compression as directed. Unnaboot w/Calamine, 4x10 (in/yd) Discharge Instruction: Apply Unnaboot at top of leg Compression Stockings Add-Ons Electronic Signature(s) Signed: 05/03/2022 3:30:08 PM By: Tommie Ard RN Entered By: Tommie Ard on 05/03/2022 07:55:53 [name] ([id number]) [id number] Page 8 of 8 -------------------------------------------------------------------------------- Vitals Details Patient Name: Date of Service: [place], [name]. 05/03/2022 7:30 A M Medical Record Number: [id number] Patient Account Number: [id number] Date of Birth/Sex: Treating RN: [birth year] (62 y.o. Kateri Mc Primary Care Yanky Vanderburg: Nira Conn Other Clinician: Referring Junette Bernat: Treating Lilli Dewald/Extender: Andrey Campanile in Treatment: 14 Vital Signs Time Taken: 07:44 Temperature (F): 98.2 Pulse (bpm): 89 Respiratory Rate (breaths/min): 18 Blood Pressure (mmHg): 164/82 Reference Range: 80 - 120 mg / dl Electronic Signature(s) Signed: 05/03/2022 3:30:08 PM By: Tommie Ard RN Entered By: Tommie Ard on 05/03/2022 07:44:32

## 2022-05-03 NOTE — Progress Notes (Signed)
[name] ([id number]) [id number] Page 1 of 11 Visit Report for 05/03/2022 Chief Complaint Document Details Patient Name: Date of Service: [place], [name]. 05/03/2022 7:30 A M Medical Record Number: [id number] Patient Account Number: [id number] Date of Birth/Sex: Treating RN: [birth year] (62 y.o. F) Primary Care Provider: Nira Conn Other Clinician: Referring Provider: Treating Provider/Extender: Andrey Campanile in Treatment: 14 Information Obtained from: Patient Chief Complaint RLE ulcer Electronic Signature(s) Signed: 05/03/2022 8:07:30 AM By: [name] MD FACS Entered By: [name] on 05/03/2022 08:07:29 -------------------------------------------------------------------------------- Debridement Details Patient Name: Date of Service: [name], [name] [name] 05/03/2022 7:30 A M Medical Record Number: [id number] Patient Account Number: [id number] Date of Birth/Sex: Treating RN: 1960-07-24 (62 y.o. [name], [name] Primary Care Provider: Nira Conn Other Clinician: Referring Provider: Treating Provider/Extender: Andrey Campanile in Treatment: 14 Debridement Performed for Assessment: Wound #2 Right,Posterior Lower Leg Performed By: Physician [name], MD Debridement Type: Debridement Severity of Tissue Pre Debridement: Fat layer exposed Level of Consciousness (Pre-procedure): Awake and Alert Pre-procedure Verification/Time Out Yes - 08:00 Taken: Start Time: 08:01 Pain Control: Lidocaine 5% topical ointment T Area Debrided (L x W): otal 2.8 (cm) x 2.7 (cm) = 7.56 (cm) Tissue and other material debrided: Non-Viable, Slough, Slough Level: Non-Viable Tissue Debridement Description: Selective/Open Wound Instrument: Curette Bleeding: Minimum Hemostasis Achieved: Pressure Response to Treatment: Procedure was tolerated well Level of Consciousness (Post- Awake and  Alert procedure): Post Debridement Measurements of Total Wound Length: (cm) 2.8 Width: (cm) 2.7 Depth: (cm) 0.2 Volume: (cm) 1.188 Character of Wound/Ulcer Post Debridement: Requires Further Debridement Severity of Tissue Post Debridement: Fat layer exposed Post Procedure Diagnosis Same as [name] ([id number]) [id number] Page 2 of 11 Notes Scribed for Dr. Lady Gary by Tommie Ard, RN Electronic Signature(s) Signed: 05/03/2022 9:00:42 AM By: [name] MD FACS Signed: 05/03/2022 3:30:08 PM By: Tommie Ard RN Entered By: Tommie Ard on 05/03/2022 08:01:33 -------------------------------------------------------------------------------- HPI Details Patient Name: Date of Service: [name], [name] 05/03/2022 7:30 A M Medical Record Number: [id number] Patient Account Number: [id number] Date of Birth/Sex: Treating RN: 07/27/1960 (62 y.o. F) Primary Care Provider: Nira Conn Other Clinician: Referring Provider: Treating Provider/Extender: Andrey Campanile in Treatment: 14 History of Present Illness HPI Description: 02/16/2020 upon evaluation today patient actually appears to be doing poorly in regard to her left medial lower extremity ulcer. This is actually an area that she tells me she has had intermittent issues with over the years although has been closed for some time she typically uses compression right now she has juxta lite compression wraps. With that being said she tells me that this nonetheless open several weeks/months ago and has been given her trouble since. She does have a history of chronic venous insufficiency she is seeing specialist for this in the past she has had an ablation as well as sclerotherapy. With that being said she also has hypertension chronically which is managed by her primary care provider. In general she seems to be worsening overall with regard to the wound and  states that she finally realized that she needed to come in and have somebody look at this and not continue to try to manage this on her own. No fevers, chills, nausea, vomiting, or diarrhea. 02/23/2020 on evaluation today patient appears to be doing well with regard to her wound. This is showing some signs of improvement which is great news still were not quite at the point where I  would like to be as far as the overall appearance of the wound is concerned but I do believe this is better than last week. I do believe the Iodoflex is helping as well. 03/08/2020 upon evaluation today patient appears to be doing well with regard to her wound. She has been tolerating the dressing changes without complication. Fortunately I feel like she has made great progress with the Iodoflex but I feel like it may be the point rest to switch to something else possibly a collagen- based dressing at this time. 03/15/2020 upon evaluation today patient appears to be doing excellent in regard to her leg ulcer. She has been tolerating the dressing changes without complication. Fortunately there is no signs of active infection. Overall she is measuring a little bit smaller today which is great news. 03/22/2020 upon evaluation today patient appears to be doing well with regard to her wound. She has been tolerating the dressing changes without complication. Fortunately there is no signs of active infection at this time. 03/28/2020; patient I do not usually see however she has a wound on the left anterior lower leg secondary to chronic venous insufficiency we have been using silver collagen under compression. She arrives in clinic with a nonviable surface requiring debridement 04/12/2020 upon evaluation today patient appears to be doing well all things considered with regard to her leg ulcer. She is tolerating the dressing changes without complication there is minimal dry skin around the edges of the wound that may be trapping and  stopping some of the events of the new skin I am can work on that today. Otherwise the surface of the wound appears to be doing excellent. 04/19/2020 upon evaluation today patient appears to be doing well with regard to her leg ulcer. She has been tolerating dressing changes without complication. Fortunately there is no signs of active infection at this time. No fever chills noted overall very pleased with how things seem to be progressing. 04/26/2020 on evaluation today patient appears to be doing well with regard to her wound currently. Is showing signs of excellent improvement overall is filling in nicely and there does not appear to be any signs of infection. No fevers, chills, nausea, vomiting, or diarrhea. 05/03/2020 upon evaluation today patient appears to be doing well with regard to her wound on the leg. This overall showing signs of good improvement which is great she has some good epithelial growth and overall I think that things are moving in the correct direction. We likewise going to continue with the wound care measures as before since she seems to making such good improvement. 2/3; venous wound on the left medial leg. This is contracting. We are using Prisma and 3 layer compression. She has a stocking and waiting in the eventuality this heals. She is already using it on the right 05/17/2020 upon evaluation today patient appears to be doing well with regard to her leg ulcer. She has been tolerating the dressing changes without complication. Fortunately there is no signs of active infection which is great news and overall very pleased with where things stand today. No fevers, chills, nausea, vomiting, or diarrhea. 05/24/2020 upon evaluation today patient appears to be doing well with regard to her wound. Overall I feel like she is making excellent progress. There does not appear to be any signs of active infection which is great news. 05/31/2020 upon evaluation today patient appears to be  doing well with regard to her wound. There does not appear to be any signs  of active infection which is great news overall I am extremely pleased with where things stand today. READMISSION 01/23/2022 She returns to clinic today with a new wound on her right posterior calf. She says that she was cleaning out an old shed near the middle of August this year and then noticed what seemed to be a bug bite on her right posterior calf. It was itchy, red, and raised. By the end of September, an ulcer had developed. She has been applying various topical creams such as hydrocortisone and others to the site. When it was not improving, she made an appointment in the wound care [name], [name] ([id number]) [id number] Page 3 of 11 center. ABI in clinic today was 0.97. On her right posterior calf, there is a circular wound with necrotic fat and black eschar present. There is no purulent drainage or malodor. The periwound skin is in good condition with just a little induration that appears to be secondary to inflammation. 01/31/2022: The wound measures a little bit larger today, but overall is quite a bit cleaner. There is some undermining from 9:00 to 3:00. She is having some periwound itching and says that her wrap slid. 02/08/2022: Despite using an Unna boot first layer at the top of her wrap, it slid again and it looks like there is been some bruising at the wound site. The wound is about the same size in terms of dimensions. There is a fair amount of slough and nonviable tissue still present. Edema control is better than last week. 02/15/2022: The wound dimensions are about the same. There is less nonviable tissue present. The periwound erythema has improved. 02/22/2022: The wound has deteriorated over the past week. It is larger and the periwound is more edematous, erythematous, and indurated. It looks as though there has been more tissue breakdown with undermining present. She is  having more pain. There is a foul odor coming from the wound. 02/26/2022: The wound looks quite a bit better today and the odor is gone. I still do not have her culture data back, but she seems to be responding well to the Augmentin. 03/06/2022: Her wound continues to improve. There is still some slough accumulation, but the periwound skin is less inflamed. Her culture returned with a polymicrobial population including Pseudomonas and so levofloxacin was also prescribed. She did not understand why a second antibiotic was being added so she has not yet initiated this. 03/14/2022: The wound is about the same, to perhaps a little bit larger. There is a fair amount of slough accumulation on the surface, as well as some hypertrophic granulation tissue. She is still taking levofloxacin, but has completed taking Augmentin. She has her Jodie EchevariaKeystone compound with her today. 12/14; the patient has a significant circular wound on the posterior right calf. She is using Keystone and silver alginate under 3 layer compression. Her ABIs were within normal limits at 0.97. This may have been traumatic or an insect bite at the start I reviewed these records. 04/04/2022: Since I last saw the wound, it has contracted considerably. There is a fairly thick layer of slough on the surface, as it has not been debrided since the last time I did it. Edema control is excellent and the periwound skin is in much better condition. 04/12/2022: No significant change in the wound dimensions. It is filling with granulation tissue. Still with slough accumulation on the surface. 04/19/2022: The wound is smaller this week. There is some slough accumulation on the surface. 04/26/2022: The wound  is smaller again this week and significantly cleaner. The wound surface is a little bit drier than ideal. 05/03/2022: The wound measurements were about the same, but visually it appears smaller. There is still some undermining at the top of the wound.  Moisture balance is better this week. Electronic Signature(s) Signed: 05/03/2022 8:08:01 AM By: Duanne Guessannon, Ammara Raj MD FACS Entered By: Duanne Guessannon, Blue Winther on 05/03/2022 08:08:00 -------------------------------------------------------------------------------- Physical Exam Details Patient Name: Date of Service: [name] [place] [name], [name] [name] 05/03/2022 7:30 A M Medical Record Number: [id number] Patient Account Number: [id number] Date of Birth/Sex: Treating RN: 11/10/1960 (62 y.o. F) Primary Care Provider: Nira ConnMichael, Barbara Other Clinician: Referring Provider: Treating Provider/Extender: Andrey Campanileannon, Triva Hueber Michael, Barbara Weeks in Treatment: 14 Constitutional Hypertensive, asymptomatic. . . . no acute distress. Respiratory Normal work of breathing on room air. Notes 05/03/2022: The wound measurements were about the same, but visually it appears smaller. There is still some undermining at the top of the wound. Moisture balance is better this week. Electronic Signature(s) Signed: 05/03/2022 8:08:41 AM By: Duanne Guessannon, Alano Blasco MD FACS Entered By: Duanne Guessannon, Arelia Volpe on 05/03/2022 08:08:41 [name], [name] ([id number]) [id number] Page 4 of 11 -------------------------------------------------------------------------------- Physician Orders Details Patient Name: Date of Service: [name], [name] [name] 05/03/2022 7:30 A M Medical Record Number: [id number] Patient Account Number: [id number] Date of Birth/Sex: Treating RN: 05/16/1960 (62 y.o. Kateri McF) Zochol, [name] Primary Care Provider: Nira ConnMichael, Barbara Other Clinician: Referring Provider: Treating Provider/Extender: Andrey Campanileannon, Wafaa Deemer Michael, Barbara Weeks in Treatment: 14 Verbal / Phone Orders: No Diagnosis Coding ICD-10 Coding Code Description [phone] Non-pressure chronic ulcer of right calf with fat layer exposed E66.01 Morbid (severe) obesity due to excess calories I10 Essential (primary) hypertension R60.0 Localized edema Follow-up  Appointments ppointment in 1 week. - Dr. Lady Garyannon - Room 2 Return A Anesthetic (In clinic) Topical Lidocaine 5% applied to wound bed Bathing/ Shower/ Hygiene May shower with protection but do not get wound dressing(s) wet. Protect dressing(s) with water repellant cover (for example, large plastic bag) or a cast cover and may then take shower. Edema Control - Lymphedema / SCD / Other Bilateral Lower Extremities Avoid standing for long periods of time. Exercise regularly Moisturize legs daily. Compression stocking or Garment 20-30 mm/Hg pressure to: - left leg daily. Apply first thing in the morning, remove at night. Wound Treatment Wound #2 - Lower Leg Wound Laterality: Right, Posterior Cleanser: Soap and Water 1 x Per Week/30 Days Discharge Instructions: May shower and wash wound with dial antibacterial soap and water prior to dressing change. Cleanser: Wound Cleanser 1 x Per Week/30 Days Discharge Instructions: Cleanse the wound with wound cleanser prior to applying a clean dressing using gauze sponges, not tissue or cotton balls. Peri-Wound Care: Triamcinolone 15 (g) 1 x Per Week/30 Days Discharge Instructions: Use triamcinolone 15 (g) as directed Peri-Wound Care: Sween Lotion (Moisturizing lotion) 1 x Per Week/30 Days Discharge Instructions: Apply moisturizing lotion as directed Topical: Skintegrity Hydrogel 4 (oz) 1 x Per Week/30 Days Discharge Instructions: Apply hydrogel as directed Topical: keystone 1 x Per Week/30 Days Prim Dressing: Promogran Prisma Matrix, 4.34 (sq in) (silver collagen) 1 x Per Week/30 Days ary Discharge Instructions: Moisten collagen with saline or hydrogel Secondary Dressing: Woven Gauze Sponge, Non-Sterile 4x4 in 1 x Per Week/30 Days Discharge Instructions: Apply over primary dressing as directed. Secondary Dressing: Zetuvit Plus 4x8 in 1 x Per Week/30 Days Discharge Instructions: Apply over primary dressing as directed. Compression Wrap: ThreePress (3  layer compression wrap) 1 x Per Week/30 Days Discharge Instructions: Apply three layer  compression as directed. Compression Wrap: Unnaboot w/Calamine, 4x10 (in/yd) 1 x Per Week/30 Days Discharge Instructions: Apply Unnaboot at top of leg ALEICIA, KENAGY ([id number]) [phone].pdf Page 5 of 11 Electronic Signature(s) Signed: 05/03/2022 9:00:42 AM By: Fredirick Maudlin MD FACS Entered By: Fredirick Maudlin on 05/03/2022 08:08:54 -------------------------------------------------------------------------------- Problem List Details Patient Name: Date of Service: [name], [name] [name] 05/03/2022 7:30 A M Medical Record Number: [id number] Patient Account Number: [id number] Date of Birth/Sex: Treating RN: [birth year] (62 y.o. F) Primary Care Provider: Loralyn Freshwater Other Clinician: Referring Provider: Treating Provider/Extender: Chesley Noon in Treatment: 14 Active Problems ICD-10 Encounter Code Description Active Date MDM Diagnosis L97.212 Non-pressure chronic ulcer of right calf with fat layer exposed 01/23/2022 No Yes E66.01 Morbid (severe) obesity due to excess calories 01/23/2022 No Yes I10 Essential (primary) hypertension 01/23/2022 No Yes R60.0 Localized edema 01/23/2022 No Yes Inactive Problems Resolved Problems Electronic Signature(s) Signed: 05/03/2022 8:07:14 AM By: Fredirick Maudlin MD FACS Entered By: Fredirick Maudlin on 05/03/2022 08:07:14 -------------------------------------------------------------------------------- Progress Note Details Patient Name: Date of Service: [name], [name] [name] 05/03/2022 7:30 A M Medical Record Number: [id number] Patient Account Number: [id number] Date of Birth/Sex: Treating RN: 07-11-1960 (62 y.o. F) Primary Care Provider: Loralyn Freshwater Other Clinician: Referring Provider: Treating Provider/Extender: Chesley Noon in Treatment: [address] Subjective Chief  Complaint ZLATY, ALEXA ([id number]) [id number] Page 6 of 11 Information obtained from Patient RLE ulcer History of Present Illness (HPI) 02/16/2020 upon evaluation today patient actually appears to be doing poorly in regard to her left medial lower extremity ulcer. This is actually an area that she tells me she has had intermittent issues with over the years although has been closed for some time she typically uses compression right now she has juxta lite compression wraps. With that being said she tells me that this nonetheless open several weeks/months ago and has been given her trouble since. She does have a history of chronic venous insufficiency she is seeing specialist for this in the past she has had an ablation as well as sclerotherapy. With that being said she also has hypertension chronically which is managed by her primary care provider. In general she seems to be worsening overall with regard to the wound and states that she finally realized that she needed to come in and have somebody look at this and not continue to try to manage this on her own. No fevers, chills, nausea, vomiting, or diarrhea. 02/23/2020 on evaluation today patient appears to be doing well with regard to her wound. This is showing some signs of improvement which is great news still were not quite at the point where I would like to be as far as the overall appearance of the wound is concerned but I do believe this is better than last week. I do believe the Iodoflex is helping as well. 03/08/2020 upon evaluation today patient appears to be doing well with regard to her wound. She has been tolerating the dressing changes without complication. Fortunately I feel like she has made great progress with the Iodoflex but I feel like it may be the point rest to switch to something else possibly a collagen- based dressing at this time. 03/15/2020 upon evaluation today patient appears to be doing  excellent in regard to her leg ulcer. She has been tolerating the dressing changes without complication. Fortunately there is no signs of active infection. Overall she is measuring a little bit smaller today which is great news. 03/22/2020 upon evaluation  today patient appears to be doing well with regard to her wound. She has been tolerating the dressing changes without complication. Fortunately there is no signs of active infection at this time. 03/28/2020; patient I do not usually see however she has a wound on the left anterior lower leg secondary to chronic venous insufficiency we have been using silver collagen under compression. She arrives in clinic with a nonviable surface requiring debridement 04/12/2020 upon evaluation today patient appears to be doing well all things considered with regard to her leg ulcer. She is tolerating the dressing changes without complication there is minimal dry skin around the edges of the wound that may be trapping and stopping some of the events of the new skin I am can work on that today. Otherwise the surface of the wound appears to be doing excellent. 04/19/2020 upon evaluation today patient appears to be doing well with regard to her leg ulcer. She has been tolerating dressing changes without complication. Fortunately there is no signs of active infection at this time. No fever chills noted overall very pleased with how things seem to be progressing. 04/26/2020 on evaluation today patient appears to be doing well with regard to her wound currently. Is showing signs of excellent improvement overall is filling in nicely and there does not appear to be any signs of infection. No fevers, chills, nausea, vomiting, or diarrhea. 05/03/2020 upon evaluation today patient appears to be doing well with regard to her wound on the leg. This overall showing signs of good improvement which is great she has some good epithelial growth and overall I think that things are moving in  the correct direction. We likewise going to continue with the wound care measures as before since she seems to making such good improvement. 2/3; venous wound on the left medial leg. This is contracting. We are using Prisma and 3 layer compression. She has a stocking and waiting in the eventuality this heals. She is already using it on the right 05/17/2020 upon evaluation today patient appears to be doing well with regard to her leg ulcer. She has been tolerating the dressing changes without complication. Fortunately there is no signs of active infection which is great news and overall very pleased with where things stand today. No fevers, chills, nausea, vomiting, or diarrhea. 05/24/2020 upon evaluation today patient appears to be doing well with regard to her wound. Overall I feel like she is making excellent progress. There does not appear to be any signs of active infection which is great news. 05/31/2020 upon evaluation today patient appears to be doing well with regard to her wound. There does not appear to be any signs of active infection which is great news overall I am extremely pleased with where things stand today. READMISSION 01/23/2022 She returns to clinic today with a new wound on her right posterior calf. She says that she was cleaning out an old shed near the middle of August this year and then noticed what seemed to be a bug bite on her right posterior calf. It was itchy, red, and raised. By the end of September, an ulcer had developed. She has been applying various topical creams such as hydrocortisone and others to the site. When it was not improving, she made an appointment in the wound care center. ABI in clinic today was 0.97. On her right posterior calf, there is a circular wound with necrotic fat and black eschar present. There is no purulent drainage or malodor. The periwound skin is  in good condition with just a little induration that appears to be secondary to  inflammation. 01/31/2022: The wound measures a little bit larger today, but overall is quite a bit cleaner. There is some undermining from 9:00 to 3:00. She is having some periwound itching and says that her wrap slid. 02/08/2022: Despite using an Unna boot first layer at the top of her wrap, it slid again and it looks like there is been some bruising at the wound site. The wound is about the same size in terms of dimensions. There is a fair amount of slough and nonviable tissue still present. Edema control is better than last week. 02/15/2022: The wound dimensions are about the same. There is less nonviable tissue present. The periwound erythema has improved. 02/22/2022: The wound has deteriorated over the past week. It is larger and the periwound is more edematous, erythematous, and indurated. It looks as though there has been more tissue breakdown with undermining present. She is having more pain. There is a foul odor coming from the wound. 02/26/2022: The wound looks quite a bit better today and the odor is gone. I still do not have her culture data back, but she seems to be responding well to the Augmentin. 03/06/2022: Her wound continues to improve. There is still some slough accumulation, but the periwound skin is less inflamed. Her culture returned with a polymicrobial population including Pseudomonas and so levofloxacin was also prescribed. She did not understand why a second antibiotic was being added so she has not yet initiated this. 03/14/2022: The wound is about the same, to perhaps a little bit larger. There is a fair amount of slough accumulation on the surface, as well as some hypertrophic granulation tissue. She is still taking levofloxacin, but has completed taking Augmentin. She has her [employer] compound with her today. 12/14; the patient has a significant circular wound on the posterior right calf. She is using Keystone and silver alginate under 3 layer compression. Her ABIs were  within normal limits at 0.97. This may have been traumatic or an insect bite at the start I reviewed these records. 04/04/2022: Since I last saw the wound, it has contracted considerably. There is a fairly thick layer of slough on the surface, as it has not been debrided since the last time I did it. Edema control is excellent and the periwound skin is in much better condition. 04/12/2022: No significant change in the wound dimensions. It is filling with granulation tissue. Still with slough accumulation on the surface. RILEIGH, KAWASHIMA ([id number]) [id number] Page 7 of 11 04/19/2022: The wound is smaller this week. There is some slough accumulation on the surface. 04/26/2022: The wound is smaller again this week and significantly cleaner. The wound surface is a little bit drier than ideal. 05/03/2022: The wound measurements were about the same, but visually it appears smaller. There is still some undermining at the top of the wound. Moisture balance is better this week. Patient History Information obtained from Patient. Family History Cancer - Mother, Diabetes - Father, Hypertension - Mother, Kidney Disease - Mother, Lung Disease - Father, Thyroid Problems - Paternal Grandparents, No family history of Heart Disease, Seizures, Stroke, Tuberculosis. Social History Never smoker, Marital Status - Married, Alcohol Use - Never, Drug Use - No History, Caffeine Use - Daily - coffee. Medical History Eyes Denies history of Cataracts, Glaucoma, Optic Neuritis Ear/Nose/Mouth/Throat Denies history of Chronic sinus problems/congestion, Middle ear problems Hematologic/Lymphatic Patient has history of Anemia - iron Denies history of Hemophilia,  Human Immunodeficiency Virus, Lymphedema, Sickle Cell Disease Respiratory Patient has history of Sleep Apnea - CPAP Denies history of Aspiration, Asthma, Chronic Obstructive Pulmonary Disease (COPD), Pneumothorax,  Tuberculosis Cardiovascular Patient has history of Hypertension, Peripheral Venous Disease Denies history of Angina, Arrhythmia, Congestive Heart Failure, Coronary Artery Disease, Deep Vein Thrombosis, Hypotension, Myocardial Infarction, Peripheral Arterial Disease, Phlebitis, Vasculitis Gastrointestinal Denies history of Cirrhosis , Colitis, Crohnoos, Hepatitis A, Hepatitis B, Hepatitis C Endocrine Denies history of Type I Diabetes, Type II Diabetes Genitourinary Denies history of End Stage Renal Disease Immunological Denies history of Lupus Erythematosus, Raynaudoos, Scleroderma Integumentary (Skin) Denies history of History of Burn Musculoskeletal Denies history of Gout, Rheumatoid Arthritis, Osteoarthritis, Osteomyelitis Neurologic Denies history of Dementia, Neuropathy, Quadriplegia, Paraplegia, Seizure Disorder Oncologic Denies history of Received Chemotherapy, Received Radiation Psychiatric Denies history of Anorexia/bulimia, Confinement Anxiety Hospitalization/Surgery History - cholecystectomy 1980s. - nephrolithasis 1980s. - plate and rod in right elbow surgery 2010. 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 no acute distress. Vitals Time Taken: 7:44 AM, Temperature: 98.2 F, Pulse: 89 bpm, Respiratory Rate: 18 breaths/min, Blood Pressure: 164/82 mmHg. Respiratory Normal work of breathing on room air. General Notes: 05/03/2022: The wound measurements were about the same, but visually it appears smaller. There is still some undermining at the top of the wound. Moisture balance is better this week. Integumentary (Hair, Skin) Wound #2 status is Open. Original cause of wound was Insect Bite. The date acquired was: 11/21/2021. The wound has been in treatment 14 weeks. The wound is located on the Right,Posterior Lower Leg. The wound measures  2.8cm length x 2.7cm width x 0.2cm depth; 5.938cm^2 area and 1.188cm^3 volume. There is Fat Layer (Subcutaneous Tissue) exposed. There is no tunneling noted, however, there is undermining starting at 11:00 and ending at 2:00 with a maximum distance of 0.3cm. There is a medium amount of serosanguineous drainage noted. The wound margin is distinct with the outline attached to the wound base. There is small (1-33%) red, friable granulation within the wound bed. There is a large (67-100%) amount of necrotic tissue within the wound bed including NICOLLETTE, WILHELMI ([id number]) [phone].pdf Page 8 of 11 Adherent Slough. The periwound skin appearance had no abnormalities noted for moisture. The periwound skin appearance exhibited: Scarring, Hemosiderin Staining, Erythema. The periwound skin appearance did not exhibit: Excoriation, Atrophie Blanche, Ecchymosis, Rubor. The surrounding wound skin color is noted with erythema. Periwound temperature was noted as No Abnormality. Assessment Active Problems ICD-10 Non-pressure chronic ulcer of right calf with fat layer exposed Morbid (severe) obesity due to excess calories Essential (primary) hypertension Localized edema Procedures Wound #2 Pre-procedure diagnosis of Wound #2 is a Venous Leg Ulcer located on the Right,Posterior Lower Leg .Severity of Tissue Pre Debridement is: Fat layer exposed. There was a Selective/Open Wound Non-Viable Tissue Debridement with a total area of 7.56 sq cm performed by [name], MD. With the following instrument(s): Curette to remove Non-Viable tissue/material. Material removed includes [facility] after achieving pain control using Lidocaine 5% topical ointment. No specimens were taken. A time out was conducted at 08:00, prior to the start of the procedure. A Minimum amount of bleeding was controlled with Pressure. The procedure was tolerated well. Post Debridement Measurements: 2.8cm length x 2.7cm  width x 0.2cm depth; 1.188cm^3 volume. Character of Wound/Ulcer Post Debridement requires further debridement. Severity of Tissue Post Debridement is: Fat layer exposed. Post procedure Diagnosis Wound #2: Same as Pre-Procedure  General Notes: Scribed for Dr. Lady Gary by Tommie Ard, RN. Pre-procedure diagnosis of Wound #2 is a Venous Leg Ulcer located on the Right,Posterior Lower Leg . There was a Three Layer Compression Therapy Procedure by Tommie Ard, RN. Post procedure Diagnosis Wound #2: Same as Pre-Procedure Plan Follow-up Appointments: Return Appointment in 1 week. - Dr. Lady Gary - Room 2 Anesthetic: (In clinic) Topical Lidocaine 5% applied to wound bed Bathing/ Shower/ Hygiene: May shower with protection but do not get wound dressing(s) wet. Protect dressing(s) with water repellant cover (for example, large plastic bag) or a cast cover and may then take shower. Edema Control - Lymphedema / SCD / Other: Avoid standing for long periods of time. Exercise regularly Moisturize legs daily. Compression stocking or Garment 20-30 mm/Hg pressure to: - left leg daily. Apply first thing in the morning, remove at night. WOUND #2: - Lower Leg Wound Laterality: Right, Posterior Cleanser: Soap and Water 1 x Per Week/30 Days Discharge Instructions: May shower and wash wound with dial antibacterial soap and water prior to dressing change. Cleanser: Wound Cleanser 1 x Per Week/30 Days Discharge Instructions: Cleanse the wound with wound cleanser prior to applying a clean dressing using gauze sponges, not tissue or cotton balls. Peri-Wound Care: Triamcinolone 15 (g) 1 x Per Week/30 Days Discharge Instructions: Use triamcinolone 15 (g) as directed Peri-Wound Care: Sween Lotion (Moisturizing lotion) 1 x Per Week/30 Days Discharge Instructions: Apply moisturizing lotion as directed Topical: Skintegrity Hydrogel 4 (oz) 1 x Per Week/30 Days Discharge Instructions: Apply hydrogel as directed Topical:  keystone 1 x Per Week/30 Days Prim Dressing: Promogran Prisma Matrix, 4.34 (sq in) (silver collagen) 1 x Per Week/30 Days ary Discharge Instructions: Moisten collagen with saline or hydrogel Secondary Dressing: Woven Gauze Sponge, Non-Sterile 4x4 in 1 x Per Week/30 Days Discharge Instructions: Apply over primary dressing as directed. Secondary Dressing: Zetuvit Plus 4x8 in 1 x Per Week/30 Days Discharge Instructions: Apply over primary dressing as directed. Com pression Wrap: ThreePress (3 layer compression wrap) 1 x Per Week/30 Days Discharge Instructions: Apply three layer compression as directed. Com pression Wrap: Unnaboot w/Calamine, 4x10 (in/yd) 1 x Per Week/30 Days Discharge Instructions: Apply Unnaboot at top of leg 05/03/2022: The wound measurements were about the same, but visually it appears smaller. There is still some undermining at the top of the wound. Moisture balance is better this week. I used a curette to debride slough from the wound. We will continue to apply Prisma silver collagen with hydrogel as well as her [facility] topical antibiotic ALYRIA, KRACK ([id number]) [id number].pdf Page 9 of 11 compound. Continue 3 layer compression. We are going to run her insurance to see if it will cover a snap VAC, as I think that might help close in the undermined area and accelerate the wound healing process. Follow-up in 1 week. Electronic Signature(s) Signed: 05/03/2022 8:09:47 AM By: [name] MD FACS Entered By: [name] on 05/03/2022 08:09:47 -------------------------------------------------------------------------------- HxROS Details Patient Name: Date of Service: [name], [name] 05/03/2022 7:30 A M Medical Record Number: [id number] Patient Account Number: [id number] Date of Birth/Sex: Treating RN: [birth year] (62 y.o. F) Primary Care Provider: Nira Conn Other Clinician: Referring Provider: Treating Provider/Extender:  Andrey Campanile in Treatment: 14 Information Obtained From Patient Eyes Medical History: Negative for: Cataracts; Glaucoma; Optic Neuritis Ear/Nose/Mouth/Throat Medical History: Negative for: Chronic sinus problems/congestion; Middle ear problems Hematologic/Lymphatic Medical History: Positive for: Anemia - iron Negative for: Hemophilia; Human Immunodeficiency Virus; Lymphedema; Sickle Cell Disease Respiratory Medical  History: Positive for: Sleep Apnea - CPAP Negative for: Aspiration; Asthma; Chronic Obstructive Pulmonary Disease (COPD); Pneumothorax; Tuberculosis Cardiovascular Medical History: Positive for: Hypertension; Peripheral Venous Disease Negative for: Angina; Arrhythmia; Congestive Heart Failure; Coronary Artery Disease; Deep Vein Thrombosis; Hypotension; Myocardial Infarction; Peripheral Arterial Disease; Phlebitis; Vasculitis Gastrointestinal Medical History: Negative for: Cirrhosis ; Colitis; Crohns; Hepatitis A; Hepatitis B; Hepatitis C Endocrine Medical History: Negative for: Type I Diabetes; Type II Diabetes Genitourinary Medical History: Negative for: End Stage Renal Disease Past Medical History Notes: years ago kidney stones Immunological Medical History: Negative for: Lupus Erythematosus; Raynauds; Scleroderma [name], [name] ([id number]) [id number] Page 10 of 11 Integumentary (Skin) Medical History: Negative for: History of Burn Musculoskeletal Medical History: Negative for: Gout; Rheumatoid Arthritis; Osteoarthritis; Osteomyelitis Neurologic Medical History: Negative for: Dementia; Neuropathy; Quadriplegia; Paraplegia; Seizure Disorder Oncologic Medical History: Negative for: Received Chemotherapy; Received Radiation Past Medical History Notes: skin Ca removed from back years ago Psychiatric Medical History: Negative for: Anorexia/bulimia; Confinement Anxiety Immunizations Pneumococcal  Vaccine: Received Pneumococcal Vaccination: No Implantable Devices None Hospitalization / Surgery History Type of Hospitalization/Surgery cholecystectomy 1980s nephrolithasis 1980s plate and rod in right elbow surgery 2010 Family and Social History Cancer: Yes - Mother; Diabetes: Yes - Father; Heart Disease: No; Hypertension: Yes - Mother; Kidney Disease: Yes - Mother; Lung Disease: Yes - Father; Seizures: No; Stroke: No; Thyroid Problems: Yes - Paternal Grandparents; Tuberculosis: No; Never smoker; Marital Status - Married; Alcohol Use: Never; Drug Use: No History; Caffeine Use: Daily - coffee; Financial Concerns: No; Food, Clothing or Shelter Needs: No; Support System Lacking: No; Transportation Concerns: No Electronic Signature(s) Signed: 05/03/2022 9:00:42 AM By: Duanne Guessannon, Satchel Heidinger MD FACS Entered By: Duanne Guessannon, Raniyah Curenton on 05/03/2022 08:08:20 -------------------------------------------------------------------------------- SuperBill Details Patient Name: Date of Service: [name] [place] [name], [name] [name] 05/03/2022 Medical Record Number: [id number] Patient Account Number: [id number] Date of Birth/Sex: Treating RN: 04/07/1961 (62 y.o. F) Primary Care Provider: Nira ConnMichael, Barbara Other Clinician: Referring Provider: Treating Provider/Extender: Andrey Campanileannon, Nobel Brar Michael, Barbara Weeks in Treatment: 14 Diagnosis Coding ICD-10 Codes Code Description [phone] Non-pressure chronic ulcer of right calf with fat layer exposed [name], [name] ([id number]) [id number] Page 11 of 11 E66.01 Morbid (severe) obesity due to excess calories I10 Essential (primary) hypertension R60.0 Localized edema Facility Procedures : CPT4 Code: [id number] Description: 97597 - DEBRIDE WOUND 1ST 20 SQ CM OR < ICD-10 Diagnosis Description L97.212 Non-pressure chronic ulcer of right calf with fat layer exposed Modifier: Quantity: 1 Physician Procedures : CPT4 Code Description Modifier [id number] 99214 - WC  PHYS LEVEL 4 - EST PT 25 ICD-10 Diagnosis Description L97.212 Non-pressure chronic ulcer of right calf with fat layer exposed E66.01 Morbid (severe) obesity due to excess calories I10 Essential  (primary) hypertension R60.0 Localized edema Quantity: 1 : [id number] 97597 - WC PHYS DEBR WO ANESTH 20 SQ CM ICD-10 Diagnosis Description L97.212 Non-pressure chronic ulcer of right calf with fat layer exposed Quantity: 1 Electronic Signature(s) Signed: 05/03/2022 8:10:03 AM By: Duanne Guessannon, Lark Runk MD FACS Entered By: Duanne Guessannon, Conchetta Lamia on 05/03/2022 08:10:02

## 2022-05-10 ENCOUNTER — Encounter (HOSPITAL_BASED_OUTPATIENT_CLINIC_OR_DEPARTMENT_OTHER): Payer: BC Managed Care – PPO | Attending: General Surgery | Admitting: General Surgery

## 2022-05-10 DIAGNOSIS — R600 Localized edema: Secondary | ICD-10-CM | POA: Diagnosis not present

## 2022-05-10 DIAGNOSIS — L97212 Non-pressure chronic ulcer of right calf with fat layer exposed: Secondary | ICD-10-CM | POA: Insufficient documentation

## 2022-05-10 DIAGNOSIS — Z6841 Body Mass Index (BMI) 40.0 and over, adult: Secondary | ICD-10-CM | POA: Diagnosis not present

## 2022-05-10 DIAGNOSIS — I10 Essential (primary) hypertension: Secondary | ICD-10-CM | POA: Diagnosis not present

## 2022-05-10 NOTE — Progress Notes (Signed)
[name], [name] ([id number]) [id number] Page 1 of 8 Visit Report for 05/10/2022 Arrival Information Details Patient Name: Date of Service: [place], [name] 05/10/2022 3:30 PM Medical Record Number: [id number] Patient Account Number: [id number] Date of Birth/Sex: Treating RN: 1960-09-01 (62 y.o. [name] Primary Care Hannah Strader: Nira Conn Other Clinician: Referring Brenae Lasecki: Treating Lilliah Priego/Extender: Andrey Campanile in Treatment: 15 Visit Information History Since Last Visit Added or deleted any medications: No Patient Arrived: Ambulatory Any new allergies or adverse reactions: No Arrival Time: 15:27 Had a fall or experienced change in No Accompanied By: self activities of daily living that may affect Transfer Assistance: None risk of falls: Patient Identification Verified: Yes Signs or symptoms of abuse/neglect since last visito No Secondary Verification Process Completed: Yes Hospitalized since last visit: No Patient Requires Transmission-Based Precautions: No Implantable device outside of the clinic excluding No Patient Has Alerts: No cellular tissue based products placed in the center since last visit: Has Dressing in Place as Prescribed: Yes Has Compression in Place as Prescribed: Yes Pain Present Now: No Electronic Signature(s) Signed: 05/10/2022 4:41:03 PM By: Samuella Bruin Entered By: Samuella Bruin on 05/10/2022 15:27:41 -------------------------------------------------------------------------------- Compression Therapy Details Patient Name: Date of Service: [name], [name] 05/10/2022 3:30 PM Medical Record Number: [id number] Patient Account Number: [id number] Date of Birth/Sex: Treating RN: 15-Oct-1960 (62 y.o. [name] Primary Care Anglia Blakley: Nira Conn Other Clinician: Referring Mitsuo Budnick: Treating Delpha Perko/Extender: Andrey Campanile in  Treatment: 15 Compression Therapy Performed for Wound Assessment: Wound #2 Right,Posterior Lower Leg Performed By: Clinician Samuella Bruin, RN Compression Type: Three Layer Post Procedure Diagnosis Same as Pre-procedure Electronic Signature(s) Signed: 05/10/2022 4:41:03 PM By: Samuella Bruin Entered By: Samuella Bruin on 05/10/2022 15:42:49 [name] ([id number]) [id number] Page 2 of 8 -------------------------------------------------------------------------------- Encounter Discharge Information Details Patient Name: Date of Service: [name] 05/10/2022 3:30 PM Medical Record Number: [id number] Patient Account Number: [id number] Date of Birth/Sex: Treating RN: 02-06-1961 (62 y.o. [name] Primary Care Xiao Graul: Nira Conn Other Clinician: Referring Avanell Banwart: Treating Skyann Ganim/Extender: Andrey Campanile in Treatment: 15 Encounter Discharge Information Items Post Procedure Vitals Discharge Condition: Stable Temperature (F): 98.2 Ambulatory Status: Ambulatory Pulse (bpm): 98 Discharge Destination: Home Respiratory Rate (breaths/min): 22 Transportation: Private Auto Blood Pressure (mmHg): 102/73 Accompanied By: self Schedule Follow-up Appointment: Yes Clinical Summary of Care: Patient Declined Electronic Signature(s) Signed: 05/10/2022 4:41:03 PM By: Samuella Bruin Entered By: Samuella Bruin on 05/10/2022 15:57:04 -------------------------------------------------------------------------------- Lower Extremity Assessment Details Patient Name: Date of Service: [place], [name] 05/10/2022 3:30 PM Medical Record Number: [id number] Patient Account Number: [id number] Date of Birth/Sex: Treating RN: 1960-11-09 (62 y.o. [name] Primary Care Tevis Dunavan: Nira Conn Other Clinician: Referring Cyrena Kuchenbecker: Treating Makalah Asberry/Extender: Andrey Campanile in Treatment: 15 Edema Assessment Assessed: [name]: No] [name]: No] [Left: Edema] [Right: :] Calf Left: Right: Point of Measurement: From Medial Instep 42 cm Ankle Left: Right: Point of Measurement: From Medial Instep 23.8 cm Vascular Assessment Pulses: Dorsalis Pedis Palpable: [Right:Yes] Electronic Signature(s) Signed: 05/10/2022 4:41:03 PM By: Samuella Bruin Entered By: Samuella Bruin on 05/10/2022 15:33:56 Multi Wound Chart Details -------------------------------------------------------------------------------- [name] ([id number]) [id number] Page 3 of 8 Patient Name: Date of Service: [place], [name] 05/10/2022 3:30 PM Medical Record Number: [id number] Patient Account Number: [id number] Date of Birth/Sex: Treating RN: 05-04-1960 (62 y.o. F) Primary Care Lucile Hillmann: Nira Conn Other Clinician: Referring Darothy Courtright: Treating Rocky Gladden/Extender: Andrey Campanile  in Treatment: 15 Vital Signs Height(in): Pulse(bpm): 98 Weight(lbs): Blood Pressure(mmHg): 102/73 Body Mass Index(BMI): Temperature(F): 98.2 Respiratory Rate(breaths/min): 22 Wound Assessments Wound Number: 2 N/A N/A Photos: N/A N/A Right, Posterior Lower Leg N/A N/A Wound Location: Insect Bite N/A N/A Wounding Event: Venous Leg Ulcer N/A N/A Primary Etiology: Anemia, Sleep Apnea, Hypertension, N/A N/A Comorbid History: Peripheral Venous Disease 11/21/2021 N/A N/A Date Acquired: 15 N/A N/A Weeks of Treatment: Open N/A N/A Wound Status: No N/A N/A Wound Recurrence: 2.5x2.4x0.2 N/A N/A Measurements L x W x D (cm) 4.712 N/A N/A A (cm) : rea 0.942 N/A N/A Volume (cm) : -1327.90% N/A N/A % Reduction in A rea: -851.50% N/A N/A % Reduction in Volume: 1 Starting Position 1 (o'clock): 4 Ending Position 1 (o'clock): 0.6 Maximum Distance 1 (cm): Yes N/A N/A Undermining: Full Thickness Without Exposed N/A  N/A Classification: Support Structures Medium N/A N/A Exudate A mount: Serosanguineous N/A N/A Exudate Type: red, brown N/A N/A Exudate Color: Distinct, outline attached N/A N/A Wound Margin: Medium (34-66%) N/A N/A Granulation A mount: Red, Friable N/A N/A Granulation Quality: Medium (34-66%) N/A N/A Necrotic A mount: Fat Layer (Subcutaneous Tissue): Yes N/A N/A Exposed Structures: Fascia: No Tendon: No Muscle: No Joint: No Bone: No Small (1-33%) N/A N/A Epithelialization: Debridement - Excisional N/A N/A Debridement: Pre-procedure Verification/Time Out 15:40 N/A N/A Taken: Lidocaine 5% topical ointment N/A N/A Pain Control: Subcutaneous, Slough N/A N/A Tissue Debrided: Skin/Subcutaneous Tissue N/A N/A Level: 6 N/A N/A Debridement A (sq cm): rea Curette N/A N/A Instrument: Minimum N/A N/A Bleeding: Pressure N/A N/A Hemostasis A chieved: Procedure was tolerated well N/A N/A Debridement Treatment Response: 2.5x2.4x0.2 N/A N/A Post Debridement Measurements L x W x D (cm) 0.942 N/A N/A Post Debridement Volume: (cm) Scarring: Yes N/A N/A Periwound Skin Texture: Excoriation: No No Abnormalities Noted N/A N/A Periwound Skin Moisture: Erythema: Yes N/A N/A Periwound Skin Color: Hemosiderin Staining: Yes Atrophie Blanche: No Ecchymosis: No Rubor: No No Abnormality N/A N/A TemperatureRONNICA, DREESE ([id number]) [id number] Page 4 of 8 Compression Therapy N/A N/A Procedures Performed: Debridement Treatment Notes Wound #2 (Lower Leg) Wound Laterality: Right, Posterior Cleanser Soap and Water Discharge Instruction: May shower and wash wound with dial antibacterial soap and water prior to dressing change. Wound Cleanser Discharge Instruction: Cleanse the wound with wound cleanser prior to applying a clean dressing using gauze sponges, not tissue or cotton balls. Peri-Wound Care Triamcinolone 15 (g) Discharge Instruction:  Use triamcinolone 15 (g) as directed Sween Lotion (Moisturizing lotion) Discharge Instruction: Apply moisturizing lotion as directed Topical Skintegrity Hydrogel 4 (oz) Discharge Instruction: Apply hydrogel as directed keystone Primary Dressing Promogran Prisma Matrix, 4.34 (sq in) (silver collagen) Discharge Instruction: Moisten collagen with saline or hydrogel Secondary Dressing Woven Gauze Sponge, Non-Sterile 4x4 in Discharge Instruction: Apply over primary dressing as directed. Zetuvit Plus 4x8 in Discharge Instruction: Apply over primary dressing as directed. Secured With Compression Wrap ThreePress (3 layer compression wrap) Discharge Instruction: Apply three layer compression as directed. Unnaboot w/Calamine, 4x10 (in/yd) Discharge Instruction: Apply Unnaboot at top of leg Compression Stockings Add-Ons Electronic Signature(s) Signed: 05/10/2022 4:35:14 PM By: Fredirick Maudlin MD FACS Entered By: Fredirick Maudlin on 05/10/2022 16:35:13 -------------------------------------------------------------------------------- Multi-Disciplinary Care Plan Details Patient Name: Date of Service: [place], [place]. 05/10/2022 3:30 PM Medical Record Number: [id number] Patient Account Number: [id number] Date of Birth/Sex: Treating RN: May 08, 1960 (62 y.o. [name] Primary Care Lanora Reveron: Loralyn Freshwater Other Clinician: Referring Jean Alejos: Treating Syana Degraffenreid/Extender: Chesley Noon in Treatment: 15 Multidisciplinary  Care Plan reviewed with physician [address] [name], [name] ([id number]) [id number] Page 5 of 8 Necrotic Tissue Nursing Diagnoses: Impaired tissue integrity related to necrotic/devitalized tissue Knowledge deficit related to management of necrotic/devitalized tissue Goals: Necrotic/devitalized tissue will be minimized in the wound bed Date Initiated: 01/23/2022 Target Resolution Date: 07/05/2022 Goal  Status: Active Patient/caregiver will verbalize understanding of reason and process for debridement of necrotic tissue Date Initiated: 01/23/2022 Target Resolution Date: 07/05/2022 Goal Status: Active Interventions: Assess patient pain level pre-, during and post procedure and prior to discharge Provide education on necrotic tissue and debridement process Treatment Activities: Apply topical anesthetic as ordered : 01/23/2022 Notes: Wound/Skin Impairment Nursing Diagnoses: Impaired tissue integrity Knowledge deficit related to ulceration/compromised skin integrity Goals: Patient/caregiver will verbalize understanding of skin care regimen Date Initiated: 01/23/2022 Target Resolution Date: 07/05/2022 Goal Status: Active Interventions: Assess patient/caregiver ability to obtain necessary supplies Assess ulceration(s) every visit Treatment Activities: Skin care regimen initiated : 01/23/2022 Topical wound management initiated : 01/23/2022 Notes: Electronic Signature(s) Signed: 05/10/2022 4:41:03 PM By: Adline Peals Entered By: Adline Peals on 05/10/2022 15:56:24 -------------------------------------------------------------------------------- Pain Assessment Details Patient Name: Date of Service: [name], [name] 05/10/2022 3:30 PM Medical Record Number: [id number] Patient Account Number: [id number] Date of Birth/Sex: Treating RN: 14-Sep-1960 (62 y.o. [name] Primary Care Moua Rasmusson: Loralyn Freshwater Other Clinician: Referring Zayra Devito: Treating Jaceyon Strole/Extender: Chesley Noon in Treatment: 15 Active Problems Location of Pain Severity and Description of Pain Patient Has Paino No Site Locations Rate the pain. [name], [name] ([id number]) [id number] Page 6 of 8 Rate the pain. Current Pain Level: 0 Pain Management and Medication Current Pain Management: Electronic Signature(s) Signed: 05/10/2022  4:41:03 PM By: Adline Peals Entered By: Adline Peals on 05/10/2022 15:27:50 -------------------------------------------------------------------------------- Patient/Caregiver Education Details Patient Name: Date of Service: [name], [name] 2/2/2024andnbsp3:30 PM Medical Record Number: [id number] Patient Account Number: [id number] Date of Birth/Gender: Treating RN: 19-Dec-1960 (62 y.o. [name] Primary Care Physician: Loralyn Freshwater Other Clinician: Referring Physician: Treating Physician/Extender: Chesley Noon in Treatment: 15 Education Assessment Education Provided To: Patient Education Topics Provided Wound/Skin Impairment: Methods: Explain/Verbal Responses: Reinforcements needed, State content correctly Electronic Signature(s) Signed: 05/10/2022 4:41:03 PM By: Adline Peals Entered By: Adline Peals on 05/10/2022 15:56:35 -------------------------------------------------------------------------------- Wound Assessment Details Patient Name: Date of Service: [name], [name] 05/10/2022 3:30 PM Medical Record Number: [id number] Patient Account Number: [id number] Date of Birth/Sex: Treating RN: 1960-07-14 (62 y.o. [name] Primary Care Corneilus Heggie: Loralyn Freshwater Other Clinician: Referring Lynnmarie Lovett: Treating Mahmud Keithly/Extender: Ranay, Ketter, Lillia Abed ([id number]) [phone].pdf Page 7 of 8 Weeks in Treatment: 15 Wound Status Wound Number: 2 Primary Venous Leg Ulcer Etiology: Wound Location: Right, Posterior Lower Leg Wound Status: Open Wounding Event: Insect Bite Comorbid Anemia, Sleep Apnea, Hypertension, Peripheral Venous Date Acquired: 11/21/2021 History: Disease Weeks Of Treatment: 15 Clustered Wound: No Photos Wound Measurements Length: (cm) 2.5 Width: (cm) 2.4 Depth: (cm) 0.2 Area: (cm) 4.712 Volume: (cm) 0.942 % Reduction in Area:  -1327.9% % Reduction in Volume: -851.5% Epithelialization: Small (1-33%) Undermining: Yes Starting Position (o'clock): 1 Ending Position (o'clock): 4 Maximum Distance: (cm) 0.6 Wound Description Classification: Full Thickness Without Exposed Support Structures Wound Margin: Distinct, outline attached Exudate Amount: Medium Exudate Type: Serosanguineous Exudate Color: red, brown Foul Odor After Cleansing: No Slough/Fibrino Yes Wound Bed Granulation Amount: Medium (34-66%) Exposed Structure Granulation Quality: Red, Friable Fascia Exposed: No Necrotic Amount: Medium (34-66%) Fat Layer (Subcutaneous Tissue) Exposed: Yes Necrotic Quality:  Adherent Slough Tendon Exposed: No Muscle Exposed: No Joint Exposed: No Bone Exposed: No Periwound Skin Texture Texture Color No Abnormalities Noted: No No Abnormalities Noted: No Excoriation: No Atrophie Blanche: No Scarring: Yes Ecchymosis: No Erythema: Yes Moisture Hemosiderin Staining: Yes No Abnormalities Noted: Yes Rubor: No Temperature / Pain Temperature: No Abnormality Treatment Notes Wound #2 (Lower Leg) Wound Laterality: Right, Posterior Cleanser Soap and Water Discharge Instruction: May shower and wash wound with dial antibacterial soap and water prior to dressing change. Wound Cleanser Discharge Instruction: Cleanse the wound with wound cleanser prior to applying a clean dressing using gauze sponges, not tissue or cotton balls. Peri-Wound Care Triamcinolone 15 (g) [name], [name] ([id number]) [id number] Page 8 of 8 Discharge Instruction: Use triamcinolone 15 (g) as directed Sween Lotion (Moisturizing lotion) Discharge Instruction: Apply moisturizing lotion as directed Topical Skintegrity Hydrogel 4 (oz) Discharge Instruction: Apply hydrogel as directed keystone Primary Dressing Promogran Prisma Matrix, 4.34 (sq in) (silver collagen) Discharge Instruction: Moisten collagen with saline or  hydrogel Secondary Dressing Woven Gauze Sponge, Non-Sterile 4x4 in Discharge Instruction: Apply over primary dressing as directed. Zetuvit Plus 4x8 in Discharge Instruction: Apply over primary dressing as directed. Secured With Compression Wrap ThreePress (3 layer compression wrap) Discharge Instruction: Apply three layer compression as directed. Unnaboot w/Calamine, 4x10 (in/yd) Discharge Instruction: Apply Unnaboot at top of leg Compression Stockings Add-Ons Electronic Signature(s) Signed: 05/10/2022 4:41:03 PM By: Adline Peals Entered By: Adline Peals on 05/10/2022 15:37:28 -------------------------------------------------------------------------------- Moore Details Patient Name: Date of Service: [name], [name] 05/10/2022 3:30 PM Medical Record Number: [id number] Patient Account Number: [id number] Date of Birth/Sex: Treating RN: 09/18/1960 (62 y.o. [name] Primary Care Lourine Alberico: Loralyn Freshwater Other Clinician: Referring Elayna Tobler: Treating Lileigh Fahringer/Extender: Chesley Noon in Treatment: 15 Vital Signs Time Taken: 15:29 Temperature (F): 98.2 Pulse (bpm): 98 Respiratory Rate (breaths/min): 22 Blood Pressure (mmHg): 102/73 Reference Range: 80 - 120 mg / dl Electronic Signature(s) Signed: 05/10/2022 4:41:03 PM By: Adline Peals Entered By: Adline Peals on 05/10/2022 15:29:30

## 2022-05-10 NOTE — Progress Notes (Signed)
[name], Corinthian [name] ([id number]) [id number] Page 1 of 11 Visit Report for 05/10/2022 Chief Complaint Document Details Patient Name: Date of Service: [name], [name] [place] [name]. 05/10/2022 3:30 PM Medical Record Number: [id number] Patient Account Number: [id number] Date of Birth/Sex: Treating RN: 05/[name]62 y.o. F) Primary Care Provider: Nira ConnMichael, Barbara Other Clinician: Referring Provider: Treating Provider/Extender: Andrey Campanileannon, Tejal Monroy Michael, Barbara Weeks in Treatment: 15 Information Obtained from: Patient Chief Complaint RLE ulcer Electronic SignatureAndersons) Signed: 05/10/2022 4:35:20 PM By: Duanne Guessannon, Haedyn Breau MD FACS Entered By: Duanne Guessannon, Abrahm Mancia on 05/10/2022 16:35:20 -------------------------------------------------------------------------------- Debridement Details Patient Name: Date of Service: [name] [name], [name] [name]. 05/10/2022 3:30 PM Medical Record Number: [id number] Patient Account Number: [id number] Date of Birth/Sex: Treating RN: [birth year]/[name]62 y.o. [name]) Herrington, Taylor Primary Care Provider: Nira ConnMichael, Barbara Other Clinician: Referring Provider: Treating Provider/Extender: Andrey Campanileannon, Teddie Curd Michael, Barbara Weeks in Treatment: 15 Debridement Performed for Assessment: Wound #2 Right,Posterior Lower Leg Performed By: Physician Duanne Guessannon, Niccolo Burggraf, MD Debridement Type: Debridement Severity of Tissue Pre Debridement: Fat layer exposed Level of Consciousness (Pre-procedure): Awake and Alert Pre-procedure Verification/Time Out Yes - 15:40 Taken: Start Time: 15:40 Pain Control: Lidocaine 5% topical ointment T Area Debrided (L x W): otal 2.5 (cm) x 2.4 (cm) = 6 (cm) Tissue and other material debrided: Non-Viable, Slough, Subcutaneous, Slough Level: Skin/Subcutaneous Tissue Debridement Description: Excisional Instrument: Curette Bleeding: Minimum Hemostasis Achieved: Pressure Response to Treatment: Procedure was tolerated well Level of Consciousness (Post- Awake  and Alert procedure): Post Debridement Measurements of Total Wound Length: (cm) 2.5 Width: (cm) 2.4 Depth: (cm) 0.2 Volume: (cm) 0.942 Character of Wound/Ulcer Post Debridement: Improved Severity of Tissue Post Debridement: Fat layer exposed Post Procedure Diagnosis Same as [name], [name] ([id number]) [id number] Page 2 of 11 Notes scribed for Dr. Lady Garyannon by Samuella Bruinaylor Herrington, RN Electronic SignatureAndersons) Signed: 05/10/2022 4:37:57 PM By: Duanne Guessannon, Marshaun Lortie MD FACS Signed: 05/10/2022 4:41:03 PM By: Gelene MinkHerrington, Taylor Entered By: Samuella BruinHerrington, Taylor on 05/10/2022 15:42:32 -------------------------------------------------------------------------------- HPI Details Patient Name: Date of Service: [name] [place] [name], [name] [name]. 05/10/2022 3:30 PM Medical Record Number: [id number] Patient Account Number: [id number] Date of Birth/Sex: Treating RN: 10[birth year]/[name]62 y.o. F) Primary Care Provider: Nira ConnMichael, Barbara Other Clinician: Referring Provider: Treating Provider/Extender: Andrey Campanileannon, Tamir Wallman Michael, Barbara Weeks in Treatment: 15 History of Present Illness HPI Description: 02/16/2020 upon evaluation today patient actually appears to be doing poorly in regard to her left medial lower extremity ulcer. This is actually an area that she tells me she has had intermittent issues with over the years although has been closed for some time she typically uses compression right now she has juxta lite compression wraps. With that being said she tells me that this nonetheless open several weeks/months ago and has been given her trouble since. She does have a history of chronic venous insufficiency she is seeing specialist for this in the past she has had an ablation as well as sclerotherapy. With that being said she also has hypertension chronically which is managed by her primary care provider. In general she seems to be worsening overall with regard to the wound and states that  she finally realized that she needed to come in and have somebody look at this and not continue to try to manage this on her own. No fevers, chills, nausea, vomiting, or diarrhea. 02/23/2020 on evaluation today patient appears to be doing well with regard to her wound. This is showing some signs of improvement which is great news still were not quite at the point where I would like to be as far  as the overall appearance of the wound is concerned but I do believe this is better than last week. I do believe the Iodoflex is helping as well. 03/08/2020 upon evaluation today patient appears to be doing well with regard to her wound. She has been tolerating the dressing changes without complication. Fortunately I feel like she has made great progress with the Iodoflex but I feel like it may be the point rest to switch to something else possibly a collagen- based dressing at this time. 03/15/2020 upon evaluation today patient appears to be doing excellent in regard to her leg ulcer. She has been tolerating the dressing changes without complication. Fortunately there is no signs of active infection. Overall she is measuring a little bit smaller today which is great news. 03/22/2020 upon evaluation today patient appears to be doing well with regard to her wound. She has been tolerating the dressing changes without complication. Fortunately there is no signs of active infection at this time. 03/28/2020; patient I do not usually see however she has a wound on the left anterior lower leg secondary to chronic venous insufficiency we have been using silver collagen under compression. She arrives in clinic with a nonviable surface requiring debridement 04/12/2020 upon evaluation today patient appears to be doing well all things considered with regard to her leg ulcer. She is tolerating the dressing changes without complication there is minimal dry skin around the edges of the wound that may be trapping and stopping some  of the events of the new skin I am can work on that today. Otherwise the surface of the wound appears to be doing excellent. 04/19/2020 upon evaluation today patient appears to be doing well with regard to her leg ulcer. She has been tolerating dressing changes without complication. Fortunately there is no signs of active infection at this time. No fever chills noted overall very pleased with how things seem to be progressing. 04/26/2020 on evaluation today patient appears to be doing well with regard to her wound currently. Is showing signs of excellent improvement overall is filling in nicely and there does not appear to be any signs of infection. No fevers, chills, nausea, vomiting, or diarrhea. 05/03/2020 upon evaluation today patient appears to be doing well with regard to her wound on the leg. This overall showing signs of good improvement which is great she has some good epithelial growth and overall I think that things are moving in the correct direction. We likewise going to continue with the wound care measures as before since she seems to making such good improvement. 2/3; venous wound on the left medial leg. This is contracting. We are using Prisma and 3 layer compression. She has a stocking and waiting in the eventuality this heals. She is already using it on the right 05/17/2020 upon evaluation today patient appears to be doing well with regard to her leg ulcer. She has been tolerating the dressing changes without complication. Fortunately there is no signs of active infection which is great news and overall very pleased with where things stand today. No fevers, chills, nausea, vomiting, or diarrhea. 05/24/2020 upon evaluation today patient appears to be doing well with regard to her wound. Overall I feel like she is making excellent progress. There does not appear to be any signs of active infection which is great news. 05/31/2020 upon evaluation today patient appears to be doing well with  regard to her wound. There does not appear to be any signs of active infection which is great  news overall I am extremely pleased with where things stand today. READMISSION 01/23/2022 She returns to clinic today with a new wound on her right posterior calf. She says that she was cleaning out an old shed near the middle of August this year and then noticed what seemed to be a bug bite on her right posterior calf. It was itchy, red, and raised. By the end of September, an ulcer had developed. She has been applying various topical creams such as hydrocortisone and others to the site. When it was not improving, she made an appointment in the wound care [name], [name] ([id number]) [id number] Page 3 of 11 center. ABI in clinic today was 0.97. On her right posterior calf, there is a circular wound with necrotic fat and black eschar present. There is no purulent drainage or malodor. The periwound skin is in good condition with just a little induration that appears to be secondary to inflammation. 01/31/2022: The wound measures a little bit larger today, but overall is quite a bit cleaner. There is some undermining from 9:00 to 3:00. She is having some periwound itching and says that her wrap slid. 02/08/2022: Despite using an Unna boot first layer at the top of her wrap, it slid again and it looks like there is been some bruising at the wound site. The wound is about the same size in terms of dimensions. There is a fair amount of slough and nonviable tissue still present. Edema control is better than last week. 02/15/2022: The wound dimensions are about the same. There is less nonviable tissue present. The periwound erythema has improved. 02/22/2022: The wound has deteriorated over the past week. It is larger and the periwound is more edematous, erythematous, and indurated. It looks as though there has been more tissue breakdown with undermining present. She is having more pain.  There is a foul odor coming from the wound. 02/26/2022: The wound looks quite a bit better today and the odor is gone. I still do not have her culture data back, but she seems to be responding well to the Augmentin. 03/06/2022: Her wound continues to improve. There is still some slough accumulation, but the periwound skin is less inflamed. Her culture returned with a polymicrobial population including Pseudomonas and so levofloxacin was also prescribed. She did not understand why a second antibiotic was being added so she has not yet initiated this. 03/14/2022: The wound is about the same, to perhaps a little bit larger. There is a fair amount of slough accumulation on the surface, as well as some hypertrophic granulation tissue. She is still taking levofloxacin, but has completed taking Augmentin. She has her Jodie Echevaria compound with her today. 12/[name]; the patient has a significant circular wound on the posterior right calf. She is using Keystone and silver alginate under 3 layer compression. Her ABIs were within normal limits at 0.97. This may have been traumatic or an insect bite at the start I reviewed these records. 04/04/2022: Since I last saw the wound, it has contracted considerably. There is a fairly thick layer of slough on the surface, as it has not been debrided since the last time I did it. Edema control is excellent and the periwound skin is in much better condition. 04/12/2022: No significant change in the wound dimensions. It is filling with granulation tissue. Still with slough accumulation on the surface. 04/19/2022: The wound is smaller this week. There is some slough accumulation on the surface. 04/26/2022: The wound is smaller again this week and  significantly cleaner. The wound surface is a little bit drier than ideal. 05/03/2022: The wound measurements were about the same, but visually it appears smaller. There is still some undermining at the top of the wound. Moisture balance is  better this week. 05/10/2022: The wound measured smaller today. There is still a fair amount of undermining present. Slough has built up on the surface. We are still awaiting snap VAC approval. Electronic SignatureAndersons) Signed: 05/10/2022 4:35:54 PM By: [name] MD FACS Entered By: [name] on 05/10/2022 16:35:53 -------------------------------------------------------------------------------- Physical Exam Details Patient Name: Date of Service: [name], [name] DA [name]. 05/10/2022 3:30 PM Medical Record Number: [id number] Patient Account Number: [id number] Date of Birth/Sex: Treating RN: [name], [name]28 y.o. F) Primary Care Provider: Nira Conn Other Clinician: Referring Provider: Treating Provider/Extender: Andrey Campanile in Treatment: 15 Constitutional . . . . no acute distress. Respiratory Normal work of breathing on room air. Notes 05/10/2022: The wound measured smaller today. There is still a fair amount of undermining present. Slough has built up on the surface. Electronic SignatureAndersons) Signed: 05/10/2022 4:36:21 PM By: [name] MD FACS Entered By: [name] on 05/10/2022 16:36:21 [name] ([id number]) [id number] Page 4 of 11 -------------------------------------------------------------------------------- Physician Orders Details Patient Name: Date of Service: [name] 05/10/2022 3:30 PM Medical Record Number: [id number] Patient Account Number: [id number] Date of Birth/Sex: Treating RN: [birth year] [name]17 y.o. [name] Phenix Primary Care Provider: Nira Conn Other Clinician: Referring Provider: Treating Provider/Extender: Andrey Campanile in Treatment: 15 Verbal / Phone Orders: No Diagnosis Coding ICD-10 Coding Code Description [phone] Non-pressure chronic ulcer of right calf with fat layer exposed E66.01 Morbid (severe) obesity due to excess  calories I10 Essential (primary) hypertension R60.0 Localized edema Follow-up Appointments ppointment in 1 week. - Dr. Lady Gary - Room 2 Return A Anesthetic (In clinic) Topical Lidocaine 5% applied to wound bed Bathing/ Shower/ Hygiene May shower with protection but do not get wound dressingAndersons) wet. Protect dressingAndersons) with water repellant cover (for example, large plastic bag) or a cast cover and may then take shower. Edema Control - Lymphedema / SCD / Other Bilateral Lower Extremities Avoid standing for long periods of time. Exercise regularly Moisturize legs daily. Compression stocking or Garment 20-30 mm/Hg pressure to: - left leg daily. Apply first thing in the morning, remove at night. Wound Treatment Wound #2 - Lower Leg Wound Laterality: Right, Posterior Cleanser: Soap and Water 1 x Per Week/30 Days Discharge Instructions: May shower and wash wound with dial antibacterial soap and water prior to dressing change. Cleanser: Wound Cleanser 1 x Per Week/30 Days Discharge Instructions: Cleanse the wound with wound cleanser prior to applying a clean dressing using gauze sponges, not tissue or cotton balls. Peri-Wound Care: Triamcinolone 15 (g) 1 x Per Week/30 Days Discharge Instructions: Use triamcinolone 15 (g) as directed Peri-Wound Care: Sween Lotion (Moisturizing lotion) 1 x Per Week/30 Days Discharge Instructions: Apply moisturizing lotion as directed Topical: Skintegrity Hydrogel 4 (oz) 1 x Per Week/30 Days Discharge Instructions: Apply hydrogel as directed Topical: keystone 1 x Per Week/30 Days Prim Dressing: Promogran Prisma Matrix, 4.34 (sq in) (silver collagen) 1 x Per Week/30 Days ary Discharge Instructions: Moisten collagen with saline or hydrogel Secondary Dressing: Woven Gauze Sponge, Non-Sterile 4x4 in 1 x Per Week/30 Days Discharge Instructions: Apply over primary dressing as directed. Secondary Dressing: Zetuvit Plus 4x8 in 1 x Per Week/30 Days Discharge  Instructions: Apply over primary dressing as directed. Compression Wrap: ThreePress (3 layer  compression wrap) 1 x Per Week/30 Days Discharge Instructions: Apply three layer compression as directed. Compression Wrap: Unnaboot w/Calamine, 4x10 (in/yd) 1 x Per Week/30 Days Discharge Instructions: Apply Unnaboot at top of leg [name], [name] ([id number]) [id number] Page 5 of 11 Patient Medications llergies: Iodinated Contrast Media, Sulfa (Sulfonamide Antibiotics) A Notifications Medication Indication Start End 05/10/2022 lidocaine DOSE topical 5 % ointment - ointment topical Electronic SignatureAndersons) Signed: 05/10/2022 4:37:57 PM By: Duanne Guessannon, Mable Dara MD FACS Entered By: Duanne Guessannon, Kyshaun Barnette on 05/10/2022 16:36:33 -------------------------------------------------------------------------------- Problem List Details Patient Name: Date of Service: [name] [place] [name], [name] [name]. 05/10/2022 3:30 PM Medical Record Number: [id number] Patient Account Number: [id number] Date of Birth/Sex: Treating RN: 01/[name]62 y.o. F) Primary Care Provider: Nira ConnMichael, Barbara Other Clinician: Referring Provider: Treating Provider/Extender: Andrey Campanileannon, Emberlyn Burlison Michael, Barbara Weeks in Treatment: 15 Active Problems ICD-10 Encounter Code Description Active Date MDM Diagnosis L97.212 Non-pressure chronic ulcer of right calf with fat layer exposed 01/23/2022 No Yes E66.01 Morbid (severe) obesity due to excess calories 01/23/2022 No Yes I10 Essential (primary) hypertension 01/23/2022 No Yes R60.0 Localized edema 01/23/2022 No Yes Inactive Problems Resolved Problems Electronic SignatureAndersons) Signed: 05/10/2022 4:35:07 PM By: Duanne Guessannon, Jinger Middlesworth MD FACS Entered By: Duanne Guessannon, Kaelie Henigan on 05/10/2022 16:35:06 -------------------------------------------------------------------------------- Progress Note Details Patient Name: Date of Service: [name] [place] [name], [name] DA [name]. 05/10/2022 3:30 PM Medical Record Number:  [id number] Patient Account Number: [id number] [name], Medora [name] ([id number]) [id number] Page 6 of 11 Date of Birth/Sex: Treating RN: [birth year]16/[name]62 y.o. F) Primary Care Provider: Other Clinician: Nira ConnMichael, Barbara Referring Provider: Treating Provider/Extender: Andrey Campanileannon, Gagan Dillion Michael, Barbara Weeks in Treatment: 15 Subjective Chief Complaint Information obtained from Patient RLE ulcer History of Present Illness (HPI) 02/16/2020 upon evaluation today patient actually appears to be doing poorly in regard to her left medial lower extremity ulcer. This is actually an area that she tells me she has had intermittent issues with over the years although has been closed for some time she typically uses compression right now she has juxta lite compression wraps. With that being said she tells me that this nonetheless open several weeks/months ago and has been given her trouble since. She does have a history of chronic venous insufficiency she is seeing specialist for this in the past she has had an ablation as well as sclerotherapy. With that being said she also has hypertension chronically which is managed by her primary care provider. In general she seems to be worsening overall with regard to the wound and states that she finally realized that she needed to come in and have somebody look at this and not continue to try to manage this on her own. No fevers, chills, nausea, vomiting, or diarrhea. 02/23/2020 on evaluation today patient appears to be doing well with regard to her wound. This is showing some signs of improvement which is great news still were not quite at the point where I would like to be as far as the overall appearance of the wound is concerned but I do believe this is better than last week. I do believe the Iodoflex is helping as well. 03/08/2020 upon evaluation today patient appears to be doing well with regard to her wound. She has been tolerating the  dressing changes without complication. Fortunately I feel like she has made great progress with the Iodoflex but I feel like it may be the point rest to switch to something else possibly a collagen- based dressing at this time. 03/15/2020 upon evaluation today patient appears to be doing excellent in regard to her  leg ulcer. She has been tolerating the dressing changes without complication. Fortunately there is no signs of active infection. Overall she is measuring a little bit smaller today which is great news. 03/22/2020 upon evaluation today patient appears to be doing well with regard to her wound. She has been tolerating the dressing changes without complication. Fortunately there is no signs of active infection at this time. 03/28/2020; patient I do not usually see however she has a wound on the left anterior lower leg secondary to chronic venous insufficiency we have been using silver collagen under compression. She arrives in clinic with a nonviable surface requiring debridement 04/12/2020 upon evaluation today patient appears to be doing well all things considered with regard to her leg ulcer. She is tolerating the dressing changes without complication there is minimal dry skin around the edges of the wound that may be trapping and stopping some of the events of the new skin I am can work on that today. Otherwise the surface of the wound appears to be doing excellent. 04/19/2020 upon evaluation today patient appears to be doing well with regard to her leg ulcer. She has been tolerating dressing changes without complication. Fortunately there is no signs of active infection at this time. No fever chills noted overall very pleased with how things seem to be progressing. 04/26/2020 on evaluation today patient appears to be doing well with regard to her wound currently. Is showing signs of excellent improvement overall is filling in nicely and there does not appear to be any signs of infection. No  fevers, chills, nausea, vomiting, or diarrhea. 05/03/2020 upon evaluation today patient appears to be doing well with regard to her wound on the leg. This overall showing signs of good improvement which is great she has some good epithelial growth and overall I think that things are moving in the correct direction. We likewise going to continue with the wound care measures as before since she seems to making such good improvement. 2/3; venous wound on the left medial leg. This is contracting. We are using Prisma and 3 layer compression. She has a stocking and waiting in the eventuality this heals. She is already using it on the right 05/17/2020 upon evaluation today patient appears to be doing well with regard to her leg ulcer. She has been tolerating the dressing changes without complication. Fortunately there is no signs of active infection which is great news and overall very pleased with where things stand today. No fevers, chills, nausea, vomiting, or diarrhea. 05/24/2020 upon evaluation today patient appears to be doing well with regard to her wound. Overall I feel like she is making excellent progress. There does not appear to be any signs of active infection which is great news. 05/31/2020 upon evaluation today patient appears to be doing well with regard to her wound. There does not appear to be any signs of active infection which is great news overall I am extremely pleased with where things stand today. READMISSION 01/23/2022 She returns to clinic today with a new wound on her right posterior calf. She says that she was cleaning out an old shed near the middle of August this year and then noticed what seemed to be a bug bite on her right posterior calf. It was itchy, red, and raised. By the end of September, an ulcer had developed. She has been applying various topical creams such as hydrocortisone and others to the site. When it was not improving, she made an appointment in the wound  care center. ABI in clinic today was 0.97. On her right posterior calf, there is a circular wound with necrotic fat and black eschar present. There is no purulent drainage or malodor. The periwound skin is in good condition with just a little induration that appears to be secondary to inflammation. 01/31/2022: The wound measures a little bit larger today, but overall is quite a bit cleaner. There is some undermining from 9:00 to 3:00. She is having some periwound itching and says that her wrap slid. 02/08/2022: Despite using an Unna boot first layer at the top of her wrap, it slid again and it looks like there is been some bruising at the wound site. The wound is about the same size in terms of dimensions. There is a fair amount of slough and nonviable tissue still present. Edema control is better than last week. 02/15/2022: The wound dimensions are about the same. There is less nonviable tissue present. The periwound erythema has improved. 02/22/2022: The wound has deteriorated over the past week. It is larger and the periwound is more edematous, erythematous, and indurated. It looks as though there has been more tissue breakdown with undermining present. She is having more pain. There is a foul odor coming from the wound. 02/26/2022: The wound looks quite a bit better today and the odor is gone. I still do not have her culture data back, but she seems to be responding well to the Augmentin. 03/06/2022: Her wound continues to improve. There is still some slough accumulation, but the periwound skin is less inflamed. Her culture returned with a polymicrobial population including Pseudomonas and so levofloxacin was also prescribed. She did not understand why a second antibiotic was being added so she has not yet initiated this. 03/14/2022: The wound is about the same, to perhaps a little bit larger. There is a fair amount of slough accumulation on the surface, as well as some CIEL, CHERVENAK  ([id number]) [id number] Page 7 of 11 hypertrophic granulation tissue. She is still taking levofloxacin, but has completed taking Augmentin. She has her Jodie Echevaria compound with her today. 12/[name]; the patient has a significant circular wound on the posterior right calf. She is using Keystone and silver alginate under 3 layer compression. Her ABIs were within normal limits at 0.97. This may have been traumatic or an insect bite at the start I reviewed these records. 04/04/2022: Since I last saw the wound, it has contracted considerably. There is a fairly thick layer of slough on the surface, as it has not been debrided since the last time I did it. Edema control is excellent and the periwound skin is in much better condition. 04/12/2022: No significant change in the wound dimensions. It is filling with granulation tissue. Still with slough accumulation on the surface. 04/19/2022: The wound is smaller this week. There is some slough accumulation on the surface. 04/26/2022: The wound is smaller again this week and significantly cleaner. The wound surface is a little bit drier than ideal. 05/03/2022: The wound measurements were about the same, but visually it appears smaller. There is still some undermining at the top of the wound. Moisture balance is better this week. 05/10/2022: The wound measured smaller today. There is still a fair amount of undermining present. Slough has built up on the surface. We are still awaiting snap VAC approval. Patient History Information obtained from Patient. Family History Cancer - Mother, Diabetes - Father, Hypertension - Mother, Kidney Disease - Mother, Lung Disease - Father, Thyroid Problems - Paternal Grandparents,  No family history of Heart Disease, Seizures, Stroke, Tuberculosis. Social History Never smoker, Marital Status - Married, Alcohol Use - Never, Drug Use - No History, Caffeine Use - Daily - coffee. Medical History Eyes Denies history  of Cataracts, Glaucoma, Optic Neuritis Ear/Nose/Mouth/Throat Denies history of Chronic sinus problems/congestion, Middle ear problems Hematologic/Lymphatic Patient has history of Anemia - iron Denies history of Hemophilia, Human Immunodeficiency Virus, Lymphedema, Sickle Cell Disease Respiratory Patient has history of Sleep Apnea - CPAP Denies history of Aspiration, Asthma, Chronic Obstructive Pulmonary Disease (COPD), Pneumothorax, Tuberculosis Cardiovascular Patient has history of Hypertension, Peripheral Venous Disease Denies history of Angina, Arrhythmia, Congestive Heart Failure, Coronary Artery Disease, Deep Vein Thrombosis, Hypotension, Myocardial Infarction, Peripheral Arterial Disease, Phlebitis, Vasculitis Gastrointestinal Denies history of Cirrhosis , Colitis, Crohnoos, Hepatitis A, Hepatitis B, Hepatitis C Endocrine Denies history of Type I Diabetes, Type II Diabetes Genitourinary Denies history of End Stage Renal Disease Immunological Denies history of Lupus Erythematosus, Raynaudoos, Scleroderma Integumentary (Skin) Denies history of History of Burn Musculoskeletal Denies history of Gout, Rheumatoid Arthritis, Osteoarthritis, Osteomyelitis Neurologic Denies history of Dementia, Neuropathy, Quadriplegia, Paraplegia, Seizure Disorder Oncologic Denies history of Received Chemotherapy, Received Radiation Psychiatric Denies history of Anorexia/bulimia, Confinement Anxiety Hospitalization/Surgery History - cholecystectomy 1980s. - nephrolithasis 1980s. - plate and rod in right elbow surgery 2010. 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 Vitals Time Taken: 3:29 PM, Temperature: 98.2 F, Pulse: 98 bpm, Respiratory Rate: 22 breaths/min, Blood Pressure: 102/73 mmHg. MAGALI, BRAY ([id number]) [id number] Page 8 of  11 Respiratory Normal work of breathing on room air. General Notes: 05/10/2022: The wound measured smaller today. There is still a fair amount of undermining present. Slough has built up on the surface. Integumentary (Hair, Skin) Wound #2 status is Open. Original cause of wound was Insect Bite. The date acquired was: 11/21/2021. The wound has been in treatment 15 weeks. The wound is located on the Right,Posterior Lower Leg. The wound measures 2.5cm length x 2.4cm width x 0.2cm depth; 4.712cm^2 area and 0.942cm^3 volume. There is Fat Layer (Subcutaneous Tissue) exposed. There is undermining starting at 1:00 and ending at 4:00 with a maximum distance of 0.6cm. There is a medium amount of serosanguineous drainage noted. The wound margin is distinct with the outline attached to the wound base. There is medium (34-66%) red, friable granulation within the wound bed. There is a medium (34-66%) amount of necrotic tissue within the wound bed including Adherent Slough. The periwound skin appearance had no abnormalities noted for moisture. The periwound skin appearance exhibited: Scarring, Hemosiderin Staining, Erythema. The periwound skin appearance did not exhibit: Excoriation, Atrophie Blanche, Ecchymosis, Rubor. The surrounding wound skin color is noted with erythema. Periwound temperature was noted as No Abnormality. Assessment Active Problems ICD-10 Non-pressure chronic ulcer of right calf with fat layer exposed Morbid (severe) obesity due to excess calories Essential (primary) hypertension Localized edema Procedures Wound #2 Pre-procedure diagnosis of Wound #2 is a Venous Leg Ulcer located on the Right,Posterior Lower Leg .Severity of Tissue Pre Debridement is: Fat layer exposed. There was a Excisional Skin/Subcutaneous Tissue Debridement with a total area of 6 sq cm performed by Fredirick Maudlin, MD. With the following instrumentAndersons): Curette to remove Non-Viable tissue/material. Material removed  includes Subcutaneous Tissue and Slough and after achieving pain control using Lidocaine 5% topical ointment. No specimens were taken. A time out was conducted at 15:40, prior to the start of the procedure. A  Minimum amount of bleeding was controlled with Pressure. The procedure was tolerated well. Post Debridement Measurements: 2.5cm length x 2.4cm width x 0.2cm depth; 0.942cm^3 volume. Character of Wound/Ulcer Post Debridement is improved. Severity of Tissue Post Debridement is: Fat layer exposed. Post procedure Diagnosis Wound #2: Same as Pre-Procedure General Notes: scribed for Dr. Lady Gary by Samuella Bruin, RN. Pre-procedure diagnosis of Wound #2 is a Venous Leg Ulcer located on the Right,Posterior Lower Leg . There was a Three Layer Compression Therapy Procedure by Samuella Bruin, RN. Post procedure Diagnosis Wound #2: Same as Pre-Procedure Plan Follow-up Appointments: Return Appointment in 1 week. - Dr. Lady Gary - Room 2 Anesthetic: (In clinic) Topical Lidocaine 5% applied to wound bed Bathing/ Shower/ Hygiene: May shower with protection but do not get wound dressingAndersons) wet. Protect dressingAndersons) with water repellant cover (for example, large plastic bag) or a cast cover and may then take shower. Edema Control - Lymphedema / SCD / Other: Avoid standing for long periods of time. Exercise regularly Moisturize legs daily. Compression stocking or Garment 20-30 mm/Hg pressure to: - left leg daily. Apply first thing in the morning, remove at night. The following medicationAndersons) was prescribed: lidocaine topical 5 % ointment ointment topical was prescribed at facility WOUND #2: - Lower Leg Wound Laterality: Right, Posterior Cleanser: Soap and Water 1 x Per Week/30 Days Discharge Instructions: May shower and wash wound with dial antibacterial soap and water prior to dressing change. Cleanser: Wound Cleanser 1 x Per Week/30 Days Discharge Instructions: Cleanse the wound with wound cleanser  prior to applying a clean dressing using gauze sponges, not tissue or cotton balls. Peri-Wound Care: Triamcinolone 15 (g) 1 x Per Week/30 Days Discharge Instructions: Use triamcinolone 15 (g) as directed Peri-Wound Care: Sween Lotion (Moisturizing lotion) 1 x Per Week/30 Days Discharge Instructions: Apply moisturizing lotion as directed Topical: Skintegrity Hydrogel 4 (oz) 1 x Per Week/30 Days Discharge Instructions: Apply hydrogel as directed Topical: keystone 1 x Per Week/30 Days Prim Dressing: Promogran Prisma Matrix, 4.34 (sq in) (silver collagen) 1 x Per Week/30 Days ary Discharge Instructions: Moisten collagen with saline or hydrogel Secondary Dressing: Woven Gauze Sponge, Non-Sterile 4x4 in 1 x Per Week/30 Days Discharge Instructions: Apply over primary dressing as directed. SANARI, OFFNER ([id number]) [id number] Page 9 of 11 Secondary Dressing: Zetuvit Plus 4x8 in 1 x Per Week/30 Days Discharge Instructions: Apply over primary dressing as directed. Compression Wrap: ThreePress (3 layer compression wrap) 1 x Per Week/30 Days Discharge Instructions: Apply three layer compression as directed. Compression Wrap: Unnaboot w/Calamine, 4x10 (in/yd) 1 x Per Week/30 Days Discharge Instructions: Apply Unnaboot at top of leg 05/10/2022: The wound measured smaller today. There is still a fair amount of undermining present. Slough has built up on the surface. I used a curette to debride slough and nonviable subcutaneous tissue from the wound. We will continue to use her Keystone topical antibiotic compound with Prisma silver collagen and 3 layer compression. Hopefully we will hear something regarding the snap VAC by her visit next week. Electronic SignatureAndersons) Signed: 05/10/2022 4:37:20 PM By: [name] MD FACS Entered By: [name] on 05/10/2022 16:37:20 -------------------------------------------------------------------------------- HxROS  Details Patient Name: Date of Service: [name], [name] DA [name]. 05/10/2022 3:30 PM Medical Record Number: [id number] Patient Account Number: [id number] Date of Birth/Sex: Treating RN: [name] (62 y.o. F) Primary Care Provider: Nira Conn Other Clinician: Referring Provider: Treating Provider/Extender: Andrey Campanile in Treatment: 15 Information Obtained From Patient Eyes Medical History: Negative for: Cataracts; Glaucoma; Optic  Neuritis Ear/Nose/Mouth/Throat Medical History: Negative for: Chronic sinus problems/congestion; Middle ear problems Hematologic/Lymphatic Medical History: Positive for: Anemia - iron Negative for: Hemophilia; Human Immunodeficiency Virus; Lymphedema; Sickle Cell Disease Respiratory Medical History: Positive for: Sleep Apnea - CPAP Negative for: Aspiration; Asthma; Chronic Obstructive Pulmonary Disease (COPD); Pneumothorax; Tuberculosis Cardiovascular Medical History: Positive for: Hypertension; Peripheral Venous Disease Negative for: Angina; Arrhythmia; Congestive Heart Failure; Coronary Artery Disease; Deep Vein Thrombosis; Hypotension; Myocardial Infarction; Peripheral Arterial Disease; Phlebitis; Vasculitis Gastrointestinal Medical History: Negative for: Cirrhosis ; Colitis; Crohns; Hepatitis A; Hepatitis B; Hepatitis C Endocrine Medical History: Negative for: Type I Diabetes; Type II Diabetes TALLULA, GRINDLE ([id number]) [id number] Page 10 of 11 Genitourinary Medical History: Negative for: End Stage Renal Disease Past Medical History Notes: years ago kidney stones Immunological Medical History: Negative for: Lupus Erythematosus; Raynauds; Scleroderma Integumentary (Skin) Medical History: Negative for: History of Burn Musculoskeletal Medical History: Negative for: Gout; Rheumatoid Arthritis; Osteoarthritis; Osteomyelitis Neurologic Medical History: Negative for: Dementia;  Neuropathy; Quadriplegia; Paraplegia; Seizure Disorder Oncologic Medical History: Negative for: Received Chemotherapy; Received Radiation Past Medical History Notes: skin Ca removed from back years ago Psychiatric Medical History: Negative for: Anorexia/bulimia; Confinement Anxiety Immunizations Pneumococcal Vaccine: Received Pneumococcal Vaccination: No Implantable Devices None Hospitalization / Surgery History Type of Hospitalization/Surgery cholecystectomy 1980s nephrolithasis 1980s plate and rod in right elbow surgery 2010 Family and Social History Cancer: Yes - Mother; Diabetes: Yes - Father; Heart Disease: No; Hypertension: Yes - Mother; Kidney Disease: Yes - Mother; Lung Disease: Yes - Father; Seizures: No; Stroke: No; Thyroid Problems: Yes - Paternal Grandparents; Tuberculosis: No; Never smoker; Marital Status - Married; Alcohol Use: Never; Drug Use: No History; Caffeine Use: Daily - coffee; Financial Concerns: No; Food, Clothing or Shelter Needs: No; Support System Lacking: No; Transportation Concerns: No Electronic SignatureAndersons) Signed: 05/10/2022 4:37:57 PM By: Fredirick Maudlin MD FACS Entered By: Fredirick Maudlin on 05/10/2022 16:36:00 -------------------------------------------------------------------------------- SuperBill Details Patient Name: Date of Service: [name], [place]. 05/10/2022 Medical Record Number: [id number] Patient Account Number: [id number] AYONNA, SPERANZA ([id number]) [id number] Page 11 of 11 Date of Birth/Sex: Treating RN: [name] (62 y.o. F) Primary Care Provider: Loralyn Freshwater Other Clinician: Referring Provider: Treating Provider/Extender: Chesley Noon in Treatment: 15 Diagnosis Coding ICD-10 Codes Code Description [phone] Non-pressure chronic ulcer of right calf with fat layer exposed E66.01 Morbid (severe) obesity due to excess calories I10 Essential (primary)  hypertension R60.0 Localized edema Facility Procedures : CPT4 Code: [id number] Description: 10175 - DEB SUBQ TISSUE 20 SQ CM/< ICD-10 Diagnosis Description L97.212 Non-pressure chronic ulcer of right calf with fat layer exposed Modifier: Quantity: 1 Physician Procedures : CPT4 Code Description Modifier [id number] - WC PHYS LEVEL 4 - EST PT 25 ICD-10 Diagnosis Description L97.212 Non-pressure chronic ulcer of right calf with fat layer exposed R60.0 Localized edema E66.01 Morbid (severe) obesity due to excess calories  I10 Essential (primary) hypertension Quantity: 1 : [id number] - WC PHYS SUBQ TISS 20 SQ CM ICD-10 Diagnosis Description L97.212 Non-pressure chronic ulcer of right calf with fat layer exposed Quantity: 1 Electronic SignatureAndersons) Signed: 05/10/2022 4:37:38 PM By: Fredirick Maudlin MD FACS Entered By: Fredirick Maudlin on 05/10/2022 16:37:37

## 2022-05-17 ENCOUNTER — Encounter (HOSPITAL_BASED_OUTPATIENT_CLINIC_OR_DEPARTMENT_OTHER): Payer: BC Managed Care – PPO | Admitting: General Surgery

## 2022-05-17 DIAGNOSIS — L97212 Non-pressure chronic ulcer of right calf with fat layer exposed: Secondary | ICD-10-CM | POA: Diagnosis not present

## 2022-05-18 NOTE — Progress Notes (Signed)
LEVY, SCOTTO ([id number]) [id number] Page 1 of 8 Visit Report for 05/17/2022 Arrival Information Details Patient Name: Date of Service: [place], [name] 05/17/2022 2:45 PM Medical Record Number: [id number] Patient Account Number: [id number] Date of Birth/Sex: Treating RN: 14-Jun-1960 (62 y.o. [name], [name] Primary Care Sparrow Sanzo: Loralyn Freshwater Other Clinician: Referring Quy Lotts: Treating Geovanie Winnett/Extender: Chesley Noon in Treatment: 16 Visit Information History Since Last Visit Added or deleted any medications: No Patient Arrived: Ambulatory Any new allergies or adverse reactions: No Arrival Time: 14:55 Had a fall or experienced change in No Accompanied By: self activities of daily living that may affect Transfer Assistance: None risk of falls: Patient Identification Verified: Yes Signs or symptoms of abuse/neglect since last visito No Secondary Verification Process Completed: Yes Hospitalized since last visit: No Patient Requires Transmission-Based Precautions: No Implantable device outside of the clinic excluding No Patient Has Alerts: No cellular tissue based products placed in the center since last visit: Has Compression in Place as Prescribed: Yes Pain Present Now: Yes Electronic Signature(s) Signed: 05/17/2022 4:08:01 PM By: [name] RN Entered By: [name] on 05/17/2022 14:55:34 -------------------------------------------------------------------------------- Compression Therapy Details Patient Name: Date of Service: [name], [name] 05/17/2022 2:45 PM Medical Record Number: [id number] Patient Account Number: [id number] Date of Birth/Sex: Treating RN: July 13, 1960 (62 y.o. [name] Primary Care Abrie Egloff: Loralyn Freshwater Other Clinician: Referring Clair Alfieri: Treating Kendel Pesnell/Extender: Chesley Noon in Treatment: 16 Compression Therapy Performed for Wound  Assessment: Wound #2 Right,Posterior Lower Leg Performed By: Clinician [name], RN Compression Type: Three Layer Post Procedure Diagnosis Same as Pre-procedure Electronic Signature(s) Signed: 05/17/2022 3:42:02 PM By: [name] RN Entered By: [name] on 05/17/2022 15:42:02 [name] ([id number] Page 2 of 8 -------------------------------------------------------------------------------- Encounter Discharge Information Details Patient Name: Date of Service: [name] 05/17/2022 2:45 PM Medical Record Number: [id number] Patient Account Number: [id number] Date of Birth/Sex: Treating RN: 1960-06-05 (62 y.o. [name] Primary Care Huntleigh Doolen: Loralyn Freshwater Other Clinician: Referring Skila Rollins: Treating Zori Benbrook/Extender: Chesley Noon in Treatment: 16 Encounter Discharge Information Items Post Procedure Vitals Discharge Condition: Stable Temperature (F): 97.9 Ambulatory Status: Ambulatory Pulse (bpm): 98 Discharge Destination: Home Respiratory Rate (breaths/min): 18 Transportation: Private Auto Blood Pressure (mmHg): 137/70 Accompanied By: self Schedule Follow-up Appointment: Yes Clinical Summary of Care: Electronic Signature(s) Signed: 05/17/2022 3:43:57 PM By: [name] RN Entered By: [name] on 05/17/2022 15:43:57 -------------------------------------------------------------------------------- Lower Extremity Assessment Details Patient Name: Date of Service: [place], [place] 05/17/2022 2:45 PM Medical Record Number: [id number] Patient Account Number: [id number] Date of Birth/Sex: Treating RN: [birth year] (62 y.o. [name] Primary Care Yordin Rhoda: Loralyn Freshwater Other Clinician: Referring Kollyn Lingafelter: Treating Yuvaan Olander/Extender: Chesley Noon in Treatment: 16 Edema Assessment Assessed: [name]: No] [name]: No] [Left: Edema] [Right:  :] Calf Left: Right: Point of Measurement: From Medial Instep 41.4 cm Ankle Left: Right: Point of Measurement: From Medial Instep 23 cm Vascular Assessment Pulses: Dorsalis Pedis Palpable: [Right:Yes] Electronic Signature(s) Signed: 05/17/2022 4:08:01 PM By: [name] RN Entered By: [name] on 05/17/2022 14:59:04 Multi Wound Chart Details -------------------------------------------------------------------------------- [name] ([id number] Page 3 of 8 Patient Name: Date of Service: [place] 05/17/2022 2:45 PM Medical Record Number: [id number] Patient Account Number: [id number] Date of Birth/Sex: Treating RN: [birth year] (62 y.o. F) Primary Care Jovin Fester: Loralyn Freshwater Other Clinician: Referring Victorious Kundinger: Treating Saryna Kneeland/Extender: Chesley Noon in Treatment: 16 Vital Signs  Height(in): Pulse(bpm): 98 Weight(lbs): Blood Pressure(mmHg): 137/70 Body Mass Index(BMI): Temperature(F): 97.9 Respiratory Rate(breaths/min): 18 Wound Assessments Wound Number: 2 N/A N/A Photos: N/A N/A Right, Posterior Lower Leg N/A N/A Wound Location: Insect Bite N/A N/A Wounding Event: Venous Leg Ulcer N/A N/A Primary Etiology: Anemia, Sleep Apnea, Hypertension, N/A N/A Comorbid History: Peripheral Venous Disease 11/21/2021 N/A N/A Date Acquired: 16 N/A N/A Weeks of Treatment: Open N/A N/A Wound Status: No N/A N/A Wound Recurrence: 2.4x2.2x0.2 N/A N/A Measurements L x W x D (cm) 4.147 N/A N/A A (cm) : rea 0.829 N/A N/A Volume (cm) : -1156.70% N/A N/A % Reduction in A rea: -737.40% N/A N/A % Reduction in Volume: 10 Starting Position 1 (o'clock): 2 Ending Position 1 (o'clock): 0.4 Maximum Distance 1 (cm): Yes N/A N/A Undermining: Full Thickness Without Exposed N/A N/A Classification: Support Structures Medium N/A N/A Exudate A mount: Serosanguineous N/A N/A Exudate  Type: red, brown N/A N/A Exudate Color: Distinct, outline attached N/A N/A Wound Margin: Medium (34-66%) N/A N/A Granulation A mount: Red, Friable N/A N/A Granulation Quality: Medium (34-66%) N/A N/A Necrotic A mount: Fat Layer (Subcutaneous Tissue): Yes N/A N/A Exposed Structures: Fascia: No Tendon: No Muscle: No Joint: No Bone: No Small (1-33%) N/A N/A Epithelialization: Debridement - Excisional N/A N/A Debridement: Pre-procedure Verification/Time Out 15:16 N/A N/A Taken: Lidocaine 5% topical ointment N/A N/A Pain Control: Subcutaneous, Slough N/A N/A Tissue Debrided: Skin/Subcutaneous Tissue N/A N/A Level: 5.28 N/A N/A Debridement A (sq cm): rea Curette N/A N/A Instrument: Minimum N/A N/A Bleeding: Pressure N/A N/A Hemostasis A chieved: Procedure was tolerated well N/A N/A Debridement Treatment Response: 2.4x2.2x0.1 N/A N/A Post Debridement Measurements L x W x D (cm) 0.415 N/A N/A Post Debridement Volume: (cm) Scarring: Yes N/A N/A Periwound Skin Texture: Excoriation: No No Abnormalities Noted N/A N/A Periwound Skin Moisture: Erythema: Yes N/A N/A Periwound Skin Color: Hemosiderin Staining: Yes Atrophie Blanche: No Ecchymosis: No Rubor: No No Abnormality N/A N/A TemperatureRODNEY, DANGLER ([id number] Page 4 of 8 Debridement N/A N/A Procedures Performed: Treatment Notes Electronic Signature(s) Signed: 05/17/2022 3:31:52 PM By: Fredirick Maudlin MD FACS Entered By: Fredirick Maudlin on 05/17/2022 15:31:52 -------------------------------------------------------------------------------- Multi-Disciplinary Care Plan Details Patient Name: Date of Service: [place], [name] 05/17/2022 2:45 PM Medical Record Number: [id number] Patient Account Number: [id number] Date of Birth/Sex: Treating RN: 02/14/1961 (62 y.o. [name] Primary Care Chanci Ojala: Loralyn Freshwater Other Clinician: Referring  Mose Colaizzi: Treating Shaquelle Hernon/Extender: Chesley Noon in Treatment: [place] reviewed with physician Active Inactive Necrotic Tissue Nursing Diagnoses: Impaired tissue integrity related to necrotic/devitalized tissue Knowledge deficit related to management of necrotic/devitalized tissue Goals: Necrotic/devitalized tissue will be minimized in the wound bed Date Initiated: 01/23/2022 Target Resolution Date: 07/05/2022 Goal Status: Active Patient/caregiver will verbalize understanding of reason and process for debridement of necrotic tissue Date Initiated: 01/23/2022 Target Resolution Date: 07/05/2022 Goal Status: Active Interventions: Assess patient pain level pre-, during and post procedure and prior to discharge Provide education on necrotic tissue and debridement process Treatment Activities: Apply topical anesthetic as ordered : 01/23/2022 Notes: Wound/Skin Impairment Nursing Diagnoses: Impaired tissue integrity Knowledge deficit related to ulceration/compromised skin integrity Goals: Patient/caregiver will verbalize understanding of skin care regimen Date Initiated: 01/23/2022 Target Resolution Date: 07/05/2022 Goal Status: Active Interventions: Assess patient/caregiver ability to obtain necessary supplies Assess ulceration(s) every visit Treatment Activities: Skin care regimen initiated : 01/23/2022 Topical wound management initiated : 01/23/2022 Notes: RANDYL, LINWOOD ([id number]) [id number] Page 5 of 8 Electronic Signature(s) Signed: 05/17/2022  3:42:41 PM By: [name] RN Entered By: [name] on 05/17/2022 15:42:41 -------------------------------------------------------------------------------- Pain Assessment Details Patient Name: Date of Service: [place], [place] 05/17/2022 2:45 PM Medical Record Number: [id number] Patient Account Number: [id number] Date of Birth/Sex: Treating  RN: 1960-11-27 (62 y.o. [name] Primary Care Gerhard Rappaport: Loralyn Freshwater Other Clinician: Referring Brittny Spangle: Treating Hinda Lindor/Extender: Chesley Noon in Treatment: 16 Active Problems Location of Pain Severity and Description of Pain Patient Has Paino No Site Locations Rate the pain. Current Pain Level: 3 Character of Pain Describe the Pain: Burning Pain Management and Medication Current Pain Management: Electronic Signature(s) Signed: 05/17/2022 4:08:01 PM By: [name] RN Entered By: [name] on 05/17/2022 14:56:29 -------------------------------------------------------------------------------- Patient/Caregiver Education Details Patient Name: Date of Service: [name], Spero Curb DA Lemmie Evens 2/9/2024andnbsp2:45 PM Medical Record Number: [id number] Patient Account Number: [id number] Date of Birth/Gender: Treating RN: 30-Jul-1960 (62 y.o. [name] Primary Care Physician: Loralyn Freshwater Other Clinician: Referring Physician: Treating Physician/Extender: Chesley Noon in Treatment: [address] EVILYN, KAZARIAN ([id number]) [id number] Page 6 of 8 Education Provided To: Patient Education Topics Provided Wound Debridement: Methods: Explain/Verbal Responses: Reinforcements needed, State content correctly Wound/Skin Impairment: Methods: Explain/Verbal Responses: Reinforcements needed, State content correctly Electronic Signature(s) Signed: 05/17/2022 4:08:01 PM By: [name] RN Entered By: [name] on 05/17/2022 15:43:00 -------------------------------------------------------------------------------- Wound Assessment Details Patient Name: Date of Service: [name], [name] 05/17/2022 2:45 PM Medical Record Number: [id number] Patient Account Number: [id number] Date of Birth/Sex: Treating RN: 1960-08-23 (62 y.o. [name], [place] Primary Care Malayiah Mcbrayer: Loralyn Freshwater  Other Clinician: Referring Alfred Eckley: Treating Xaviar Lunn/Extender: Chesley Noon in Treatment: 16 Wound Status Wound Number: 2 Primary Venous Leg Ulcer Etiology: Wound Location: Right, Posterior Lower Leg Wound Status: Open Wounding Event: Insect Bite Comorbid Anemia, Sleep Apnea, Hypertension, Peripheral Venous Date Acquired: 11/21/2021 History: Disease Weeks Of Treatment: 16 Clustered Wound: No Photos Wound Measurements Length: (cm) 2.4 Width: (cm) 2.2 Depth: (cm) 0.2 Area: (cm) 4.147 Volume: (cm) 0.829 % Reduction in Area: -1156.7% % Reduction in Volume: -737.4% Epithelialization: Small (1-33%) Tunneling: No Undermining: Yes Starting Position (o'clock): 10 Ending Position (o'clock): 2 Maximum Distance: (cm) 0.4 Wound Description Classification: Full Thickness Without Exposed Support Structures Wound Margin: Distinct, outline attached Exudate Amount: Medium Exudate Type: Serosanguineous Exudate Color: red, brown RANI, FAILLE ([id number]) Foul Odor After Cleansing: No Slough/Fibrino Yes [id number] Page 7 of 8 Wound Bed Granulation Amount: Medium (34-66%) Exposed Structure Granulation Quality: Red, Friable Fascia Exposed: No Necrotic Amount: Medium (34-66%) Fat Layer (Subcutaneous Tissue) Exposed: Yes Necrotic Quality: Adherent Slough Tendon Exposed: No Muscle Exposed: No Joint Exposed: No Bone Exposed: No Periwound Skin Texture Texture Color No Abnormalities Noted: No No Abnormalities Noted: No Excoriation: No Atrophie Blanche: No Scarring: Yes Ecchymosis: No Erythema: Yes Moisture Hemosiderin Staining: Yes No Abnormalities Noted: Yes Rubor: No Temperature / Pain Temperature: No Abnormality Treatment Notes Wound #2 (Lower Leg) Wound Laterality: Right, Posterior Cleanser Soap and Water Discharge Instruction: May shower and wash wound with dial antibacterial soap and water prior to dressing  change. Wound Cleanser Discharge Instruction: Cleanse the wound with wound cleanser prior to applying a clean dressing using gauze sponges, not tissue or cotton balls. Peri-Wound Care Triamcinolone 15 (g) Discharge Instruction: Use triamcinolone 15 (g) as directed Sween Lotion (Moisturizing lotion) Discharge Instruction: Apply moisturizing lotion as directed Topical Skintegrity Hydrogel 4 (oz) Discharge Instruction: Apply hydrogel as directed keystone Primary Dressing Promogran Prisma Matrix, 4.34 (sq in) (silver collagen)  Discharge Instruction: Moisten collagen with saline or hydrogel Secondary Dressing Woven Gauze Sponge, Non-Sterile 4x4 in Discharge Instruction: Apply over primary dressing as directed. Zetuvit Plus 4x8 in Discharge Instruction: Apply over primary dressing as directed. Secured With Compression Wrap ThreePress (3 layer compression wrap) Discharge Instruction: Apply three layer compression as directed. Unnaboot w/Calamine, 4x10 (in/yd) Discharge Instruction: Apply Unnaboot at top of leg Compression Stockings Add-Ons Electronic Signature(s) Signed: 05/17/2022 4:08:01 PM By: [name] RN Entered By: [name] on 05/17/2022 15:06:17 [name] ([id number] Page 8 of 8 -------------------------------------------------------------------------------- Vitals Details Patient Name: Date of Service: [name] 05/17/2022 2:45 PM Medical Record Number: [id number] Patient Account Number: [id number] Date of Birth/Sex: Treating RN: [birth year] (62 y.o. [name] Primary Care Ceejay Kegley: Loralyn Freshwater Other Clinician: Referring Jimmie Dattilio: Treating Pardeep Pautz/Extender: Chesley Noon in Treatment: 16 Vital Signs Time Taken: 14:55 Temperature (F): 97.9 Pulse (bpm): 98 Respiratory Rate (breaths/min): 18 Blood Pressure (mmHg): 137/70 Reference Range: 80 - 120 mg / dl Electronic  Signature(s) Signed: 05/17/2022 4:08:01 PM By: [name] RN Entered By: [name] on 05/17/2022 14:56:16

## 2022-05-18 NOTE — Progress Notes (Addendum)
[name], [name] ([id number]) [id number] Page 1 of 10 Visit Report for 05/17/2022 Chief Complaint Document Details Patient Name: Date of Service: [place] 05/17/2022 2:45 PM Medical Record Number: [id number] Patient Account Number: [id number] Date of Birth/Sex: Treating RN: November 23, 1960 (63 y.o. F) Primary Care Provider: Loralyn Freshwater Other Clinician: Referring Provider: Treating Provider/Extender: Chesley Noon in Treatment: 16 Information Obtained from: Patient Chief Complaint RLE ulcer Electronic Signature(s) Signed: 05/17/2022 3:32:01 PM By: Fredirick Maudlin MD FACS Entered By: Fredirick Maudlin on 05/17/2022 15:32:01 -------------------------------------------------------------------------------- Debridement Details Patient Name: Date of Service: [name], [name] 05/17/2022 2:45 PM Medical Record Number: [id number] Patient Account Number: [id number] Date of Birth/Sex: Treating RN: [birth year] (62 y.o. [name], [name] Primary Care Provider: Loralyn Freshwater Other Clinician: Referring Provider: Treating Provider/Extender: Chesley Noon in Treatment: 16 Debridement Performed for Assessment: Wound #2 Right,Posterior Lower Leg Performed By: Physician Fredirick Maudlin, MD Debridement Type: Debridement Severity of Tissue Pre Debridement: Fat layer exposed Level of Consciousness (Pre-procedure): Awake and Alert Pre-procedure Verification/Time Out Yes - 15:16 Taken: Start Time: 15:17 Pain Control: Lidocaine 5% topical ointment T Area Debrided (L x W): otal 2.4 (cm) x 2.2 (cm) = 5.28 (cm) Tissue and other material debrided: Viable, Non-Viable, Slough, Subcutaneous, Slough Level: Skin/Subcutaneous Tissue Debridement Description: Excisional Instrument: Curette Bleeding: Minimum Hemostasis Achieved: Pressure Response to Treatment: Procedure was tolerated well Level of Consciousness (Post-  Awake and Alert procedure): Post Debridement Measurements of Total Wound Length: (cm) 2.4 Width: (cm) 2.2 Depth: (cm) 0.1 Volume: (cm) 0.415 Character of Wound/Ulcer Post Debridement: Requires Further Debridement Severity of Tissue Post Debridement: Fat layer exposed Post Procedure Diagnosis Same as Pre-procedure Notes Scribed for Dr. Celine Ahr by [name], RN Electronic Signature(s) Signed: 05/17/2022 3:43:44 PM By: Fredirick Maudlin MD FACS Signed: 05/17/2022 4:08:01 PM By: [name] RN Entered By: [name] on 05/17/2022 15:18:35 [name] ([id number] Page 2 of 10 -------------------------------------------------------------------------------- HPI Details Patient Name: Date of Service: [name] 05/17/2022 2:45 PM Medical Record Number: [id number] Patient Account Number: [id number] Date of Birth/Sex: Treating RN: 25-Nov-1960 (62 y.o. F) Primary Care Provider: Loralyn Freshwater Other Clinician: Referring Provider: Treating Provider/Extender: Chesley Noon in Treatment: 16 History of Present Illness HPI Description: 02/16/2020 upon evaluation today patient actually appears to be doing poorly in regard to her left medial lower extremity ulcer. This is actually an area that she tells me she has had intermittent issues with over the years although has been closed for some time she typically uses compression right now she has juxta lite compression wraps. With that being said she tells me that this nonetheless open several weeks/months ago and has been given her trouble since. She does have a history of chronic venous insufficiency she is seeing specialist for this in the past she has had an ablation as well as sclerotherapy. With that being said she also has hypertension chronically which is managed by her primary care provider. In general she seems to be worsening overall with regard to the wound  and states that she finally realized that she needed to come in and have somebody look at this and not continue to try to manage this on her own. No fevers, chills, nausea, vomiting, or diarrhea. 02/23/2020 on evaluation today patient appears to be doing well with regard to her wound. This is showing some signs of improvement which is great news still were not quite at the point where I would like  to be as far as the overall appearance of the wound is concerned but I do believe this is better than last week. I do believe the Iodoflex is helping as well. 03/08/2020 upon evaluation today patient appears to be doing well with regard to her wound. She has been tolerating the dressing changes without complication. Fortunately I feel like she has made great progress with the Iodoflex but I feel like it may be the point rest to switch to something else possibly a collagen- based dressing at this time. 03/15/2020 upon evaluation today patient appears to be doing excellent in regard to her leg ulcer. She has been tolerating the dressing changes without complication. Fortunately there is no signs of active infection. Overall she is measuring a little bit smaller today which is great news. 03/22/2020 upon evaluation today patient appears to be doing well with regard to her wound. She has been tolerating the dressing changes without complication. Fortunately there is no signs of active infection at this time. 03/28/2020; patient I do not usually see however she has a wound on the left anterior lower leg secondary to chronic venous insufficiency we have been using silver collagen under compression. She arrives in clinic with a nonviable surface requiring debridement 04/12/2020 upon evaluation today patient appears to be doing well all things considered with regard to her leg ulcer. She is tolerating the dressing changes without complication there is minimal dry skin around the edges of the wound that may be trapping  and stopping some of the events of the new skin I am can work on that today. Otherwise the surface of the wound appears to be doing excellent. 04/19/2020 upon evaluation today patient appears to be doing well with regard to her leg ulcer. She has been tolerating dressing changes without complication. Fortunately there is no signs of active infection at this time. No fever chills noted overall very pleased with how things seem to be progressing. 04/26/2020 on evaluation today patient appears to be doing well with regard to her wound currently. Is showing signs of excellent improvement overall is filling in nicely and there does not appear to be any signs of infection. No fevers, chills, nausea, vomiting, or diarrhea. 05/03/2020 upon evaluation today patient appears to be doing well with regard to her wound on the leg. This overall showing signs of good improvement which is great she has some good epithelial growth and overall I think that things are moving in the correct direction. We likewise going to continue with the wound care measures as before since she seems to making such good improvement. 2/3; venous wound on the left medial leg. This is contracting. We are using Prisma and 3 layer compression. She has a stocking and waiting in the eventuality this heals. She is already using it on the right 05/17/2020 upon evaluation today patient appears to be doing well with regard to her leg ulcer. She has been tolerating the dressing changes without complication. Fortunately there is no signs of active infection which is great news and overall very pleased with where things stand today. No fevers, chills, nausea, vomiting, or diarrhea. 05/24/2020 upon evaluation today patient appears to be doing well with regard to her wound. Overall I feel like she is making excellent progress. There does not appear to be any signs of active infection which is great news. 05/31/2020 upon evaluation today patient appears to be  doing well with regard to her wound. There does not appear to be any signs of active  infection which is great news overall I am extremely pleased with where things stand today. READMISSION 01/23/2022 She returns to clinic today with a new wound on her right posterior calf. She says that she was cleaning out an old shed near the middle of August this year and then noticed what seemed to be a bug bite on her right posterior calf. It was itchy, red, and raised. By the end of September, an ulcer had developed. She has been applying various topical creams such as hydrocortisone and others to the site. When it was not improving, she made an appointment in the wound care center. ABI in clinic today was 0.97. On her right posterior calf, there is a circular wound with necrotic fat and black eschar present. There is no purulent drainage or malodor. The periwound skin is in good condition with just a little induration that appears to be secondary to inflammation. 01/31/2022: The wound measures a little bit larger today, but overall is quite a bit cleaner. There is some undermining from 9:00 to 3:00. She is having some periwound itching and says that her wrap slid. 02/08/2022: Despite using an Unna boot first layer at the top of her wrap, it slid again and it looks like there is been some bruising at the wound site. The wound is about the same size in terms of dimensions. There is a fair amount of slough and nonviable tissue still present. Edema control is better than last week. 02/15/2022: The wound dimensions are about the same. There is less nonviable tissue present. The periwound erythema has improved. 02/22/2022: The wound has deteriorated over the past week. It is larger and the periwound is more edematous, erythematous, and indurated. It looks as though there has been more tissue breakdown with undermining present. She is having more pain. There is a foul odor coming from the wound. 02/26/2022: The wound  looks quite a bit better today and the odor is gone. I still do not have her culture data back, but she seems to be responding well to the Augmentin. 03/06/2022: Her wound continues to improve. There is still some slough accumulation, but the periwound skin is less inflamed. Her culture returned with a KINDY, MISURACA ([id number]) [id number] Page 3 of 10 polymicrobial population including Pseudomonas and so levofloxacin was also prescribed. She did not understand why a second antibiotic was being added so she has not yet initiated this. 03/14/2022: The wound is about the same, to perhaps a little bit larger. There is a fair amount of slough accumulation on the surface, as well as some hypertrophic granulation tissue. She is still taking levofloxacin, but has completed taking Augmentin. She has her [employer] compound with her today. 12/14; the patient has a significant circular wound on the posterior right calf. She is using Keystone and silver alginate under 3 layer compression. Her ABIs were within normal limits at 0.97. This may have been traumatic or an insect bite at the start I reviewed these records. 04/04/2022: Since I last saw the wound, it has contracted considerably. There is a fairly thick layer of slough on the surface, as it has not been debrided since the last time I did it. Edema control is excellent and the periwound skin is in much better condition. 04/12/2022: No significant change in the wound dimensions. It is filling with granulation tissue. Still with slough accumulation on the surface. 04/19/2022: The wound is smaller this week. There is some slough accumulation on the surface. 04/26/2022: The wound is smaller  again this week and significantly cleaner. The wound surface is a little bit drier than ideal. 05/03/2022: The wound measurements were about the same, but visually it appears smaller. There is still some undermining at the top of the wound.  Moisture balance is better this week. 05/10/2022: The wound measured smaller today. There is still a fair amount of undermining present. Slough has built up on the surface. We are still awaiting snap VAC approval. 05/17/2022: The wound is a little bit smaller today. There is less slough on the surface, but the granulation tissue still is not very robust. She has been approved for snap VAC but will have a 20% coinsurance and it is not clear what that amount would end up being for her. Electronic Signature(s) Signed: 05/17/2022 3:33:22 PM By: Fredirick Maudlin MD FACS Entered By: Fredirick Maudlin on 05/17/2022 15:33:22 -------------------------------------------------------------------------------- Physical Exam Details Patient Name: Date of Service: [name], [name] 05/17/2022 2:45 PM Medical Record Number: [id number] Patient Account Number: [id number] Date of Birth/Sex: Treating RN: 01-31-1961 (62 y.o. F) Primary Care Provider: Loralyn Freshwater Other Clinician: Referring Provider: Treating Provider/Extender: Chesley Noon in Treatment: 16 Constitutional . . . . no acute distress. Respiratory Normal work of breathing on room air. Notes 05/17/2022: The wound is a little bit smaller today. There is less slough on the surface, but the granulation tissue still is not very robust. Electronic Signature(s) Signed: 05/17/2022 3:33:51 PM By: Fredirick Maudlin MD FACS Entered By: Fredirick Maudlin on 05/17/2022 15:33:51 -------------------------------------------------------------------------------- Physician Orders Details Patient Name: Date of Service: [address] [name] 05/17/2022 2:45 PM Medical Record Number: [id number] Patient Account Number: [id number] Date of Birth/Sex: Treating RN: [birth year] (62 y.o. [name] Primary Care Provider: Loralyn Freshwater Other Clinician: Referring Provider: Treating Provider/Extender: Chesley Noon in  Treatment: 16 Verbal / Phone Orders: No Diagnosis Coding ICD-10 Coding Code Description [phone] Non-pressure chronic ulcer of right calf with fat layer exposed E66.01 Morbid (severe) obesity due to excess calories I10 Essential (primary) hypertension [name], [name] ([id number]) [phone].pdf Page 4 of 10 R60.0 Localized edema Follow-up Appointments ppointment in 1 week. - Dr. Celine Ahr - Room 2 Return A Anesthetic (In clinic) Topical Lidocaine 5% applied to wound bed Bathing/ Shower/ Hygiene May shower with protection but do not get wound dressing(s) wet. Protect dressing(s) with water repellant cover (for example, large plastic bag) or a cast cover and may then take shower. Edema Control - Lymphedema / SCD / Other Bilateral Lower Extremities Avoid standing for long periods of time. Exercise regularly Moisturize legs daily. Compression stocking or Garment 20-30 mm/Hg pressure to: - left leg daily. Apply first thing in the morning, remove at night. Wound Treatment Wound #2 - Lower Leg Wound Laterality: Right, Posterior Cleanser: Soap and Water 1 x Per Week/30 Days Discharge Instructions: May shower and wash wound with dial antibacterial soap and water prior to dressing change. Cleanser: Wound Cleanser 1 x Per Week/30 Days Discharge Instructions: Cleanse the wound with wound cleanser prior to applying a clean dressing using gauze sponges, not tissue or cotton balls. Peri-Wound Care: Triamcinolone 15 (g) 1 x Per Week/30 Days Discharge Instructions: Use triamcinolone 15 (g) as directed Peri-Wound Care: Sween Lotion (Moisturizing lotion) 1 x Per Week/30 Days Discharge Instructions: Apply moisturizing lotion as directed Topical: Skintegrity Hydrogel 4 (oz) 1 x Per Week/30 Days Discharge Instructions: Apply hydrogel as directed Topical: keystone 1 x Per Week/30 Days Prim Dressing: Promogran Prisma Matrix, 4.34 (sq in) (silver collagen) 1  x Per Week/30  Days ary Discharge Instructions: Moisten collagen with saline or hydrogel Secondary Dressing: Woven Gauze Sponge, Non-Sterile 4x4 in 1 x Per Week/30 Days Discharge Instructions: Apply over primary dressing as directed. Secondary Dressing: Zetuvit Plus 4x8 in 1 x Per Week/30 Days Discharge Instructions: Apply over primary dressing as directed. Compression Wrap: ThreePress (3 layer compression wrap) 1 x Per Week/30 Days Discharge Instructions: Apply three layer compression as directed. Compression Wrap: Unnaboot w/Calamine, 4x10 (in/yd) 1 x Per Week/30 Days Discharge Instructions: Apply Unnaboot at top of leg Electronic Signature(s) Signed: 05/17/2022 3:43:44 PM By: Fredirick Maudlin MD FACS Signed: 05/17/2022 4:08:01 PM By: [name] RN Entered By: [name] on 05/17/2022 15:42:33 -------------------------------------------------------------------------------- Problem List Details Patient Name: Date of Service: [name], [name] 05/17/2022 2:45 PM Medical Record Number: [id number] Patient Account Number: [id number] Date of Birth/Sex: Treating RN: 1960/12/25 (62 y.o. F) Primary Care Provider: Loralyn Freshwater Other Clinician: Referring Provider: Treating Provider/Extender: Chesley Noon in Treatment: 16 Active Problems ICD-10 Encounter Code Description Active Date MDM [name], [name] ([id number]) [phone].pdf Page 5 of 10 Code Description Active Date MDM Diagnosis L97.212 Non-pressure chronic ulcer of right calf with fat layer exposed 01/23/2022 No Yes E66.01 Morbid (severe) obesity due to excess calories 01/23/2022 No Yes I10 Essential (primary) hypertension 01/23/2022 No Yes R60.0 Localized edema 01/23/2022 No Yes Inactive Problems Resolved Problems Electronic Signature(s) Signed: 05/17/2022 3:31:43 PM By: Fredirick Maudlin MD FACS Entered By: Fredirick Maudlin on 05/17/2022  15:31:43 -------------------------------------------------------------------------------- Progress Note Details Patient Name: Date of Service: [name], [name] 05/17/2022 2:45 PM Medical Record Number: [id number] Patient Account Number: [id number] Date of Birth/Sex: Treating RN: 10-Jul-1960 (62 y.o. F) Primary Care Provider: Loralyn Freshwater Other Clinician: Referring Provider: Treating Provider/Extender: Chesley Noon in Treatment: 16 Subjective Chief Complaint Information obtained from Patient RLE ulcer History of Present Illness (HPI) 02/16/2020 upon evaluation today patient actually appears to be doing poorly in regard to her left medial lower extremity ulcer. This is actually an area that she tells me she has had intermittent issues with over the years although has been closed for some time she typically uses compression right now she has juxta lite compression wraps. With that being said she tells me that this nonetheless open several weeks/months ago and has been given her trouble since. She does have a history of chronic venous insufficiency she is seeing specialist for this in the past she has had an ablation as well as sclerotherapy. With that being said she also has hypertension chronically which is managed by her primary care provider. In general she seems to be worsening overall with regard to the wound and states that she finally realized that she needed to come in and have somebody look at this and not continue to try to manage this on her own. No fevers, chills, nausea, vomiting, or diarrhea. 02/23/2020 on evaluation today patient appears to be doing well with regard to her wound. This is showing some signs of improvement which is great news still were not quite at the point where I would like to be as far as the overall appearance of the wound is concerned but I do believe this is better than last week. I do believe the Iodoflex is helping as  well. 03/08/2020 upon evaluation today patient appears to be doing well with regard to her wound. She has been tolerating the dressing changes without complication. Fortunately I feel like she has made great progress with the  Iodoflex but I feel like it may be the point rest to switch to something else possibly a collagen- based dressing at this time. 03/15/2020 upon evaluation today patient appears to be doing excellent in regard to her leg ulcer. She has been tolerating the dressing changes without complication. Fortunately there is no signs of active infection. Overall she is measuring a little bit smaller today which is great news. 03/22/2020 upon evaluation today patient appears to be doing well with regard to her wound. She has been tolerating the dressing changes without complication. Fortunately there is no signs of active infection at this time. 03/28/2020; patient I do not usually see however she has a wound on the left anterior lower leg secondary to chronic venous insufficiency we have been using silver collagen under compression. She arrives in clinic with a nonviable surface requiring debridement 04/12/2020 upon evaluation today patient appears to be doing well all things considered with regard to her leg ulcer. She is tolerating the dressing changes without complication there is minimal dry skin around the edges of the wound that may be trapping and stopping some of the events of the new skin I am can work on that today. Otherwise the surface of the wound appears to be doing excellent. 04/19/2020 upon evaluation today patient appears to be doing well with regard to her leg ulcer. She has been tolerating dressing changes without complication. Fortunately there is no signs of active infection at this time. No fever chills noted overall very pleased with how things seem to be progressing. 04/26/2020 on evaluation today patient appears to be doing well with regard to her wound currently. Is  showing signs of excellent improvement overall is filling in nicely and there does not appear to be any signs of infection. No fevers, chills, nausea, vomiting, or diarrhea. 05/03/2020 upon evaluation today patient appears to be doing well with regard to her wound on the leg. This overall showing signs of good improvement which is [name], [name] ([id number]) [phone].pdf Page 6 of 10 great she has some good epithelial growth and overall I think that things are moving in the correct direction. We likewise going to continue with the wound care measures as before since she seems to making such good improvement. 2/3; venous wound on the left medial leg. This is contracting. We are using Prisma and 3 layer compression. She has a stocking and waiting in the eventuality this heals. She is already using it on the right 05/17/2020 upon evaluation today patient appears to be doing well with regard to her leg ulcer. She has been tolerating the dressing changes without complication. Fortunately there is no signs of active infection which is great news and overall very pleased with where things stand today. No fevers, chills, nausea, vomiting, or diarrhea. 05/24/2020 upon evaluation today patient appears to be doing well with regard to her wound. Overall I feel like she is making excellent progress. There does not appear to be any signs of active infection which is great news. 05/31/2020 upon evaluation today patient appears to be doing well with regard to her wound. There does not appear to be any signs of active infection which is great news overall I am extremely pleased with where things stand today. READMISSION 01/23/2022 She returns to clinic today with a new wound on her right posterior calf. She says that she was cleaning out an old shed near the middle of August this year and then noticed what seemed to be a bug bite on  her right posterior calf. It was itchy, red, and raised. By  the end of September, an ulcer had developed. She has been applying various topical creams such as hydrocortisone and others to the site. When it was not improving, she made an appointment in the wound care center. ABI in clinic today was 0.97. On her right posterior calf, there is a circular wound with necrotic fat and black eschar present. There is no purulent drainage or malodor. The periwound skin is in good condition with just a little induration that appears to be secondary to inflammation. 01/31/2022: The wound measures a little bit larger today, but overall is quite a bit cleaner. There is some undermining from 9:00 to 3:00. She is having some periwound itching and says that her wrap slid. 02/08/2022: Despite using an Unna boot first layer at the top of her wrap, it slid again and it looks like there is been some bruising at the wound site. The wound is about the same size in terms of dimensions. There is a fair amount of slough and nonviable tissue still present. Edema control is better than last week. 02/15/2022: The wound dimensions are about the same. There is less nonviable tissue present. The periwound erythema has improved. 02/22/2022: The wound has deteriorated over the past week. It is larger and the periwound is more edematous, erythematous, and indurated. It looks as though there has been more tissue breakdown with undermining present. She is having more pain. There is a foul odor coming from the wound. 02/26/2022: The wound looks quite a bit better today and the odor is gone. I still do not have her culture data back, but she seems to be responding well to the Augmentin. 03/06/2022: Her wound continues to improve. There is still some slough accumulation, but the periwound skin is less inflamed. Her culture returned with a polymicrobial population including Pseudomonas and so levofloxacin was also prescribed. She did not understand why a second antibiotic was being added so she has  not yet initiated this. 03/14/2022: The wound is about the same, to perhaps a little bit larger. There is a fair amount of slough accumulation on the surface, as well as some hypertrophic granulation tissue. She is still taking levofloxacin, but has completed taking Augmentin. She has her [employer] compound with her today. 12/14; the patient has a significant circular wound on the posterior right calf. She is using Keystone and silver alginate under 3 layer compression. Her ABIs were within normal limits at 0.97. This may have been traumatic or an insect bite at the start I reviewed these records. 04/04/2022: Since I last saw the wound, it has contracted considerably. There is a fairly thick layer of slough on the surface, as it has not been debrided since the last time I did it. Edema control is excellent and the periwound skin is in much better condition. 04/12/2022: No significant change in the wound dimensions. It is filling with granulation tissue. Still with slough accumulation on the surface. 04/19/2022: The wound is smaller this week. There is some slough accumulation on the surface. 04/26/2022: The wound is smaller again this week and significantly cleaner. The wound surface is a little bit drier than ideal. 05/03/2022: The wound measurements were about the same, but visually it appears smaller. There is still some undermining at the top of the wound. Moisture balance is better this week. 05/10/2022: The wound measured smaller today. There is still a fair amount of undermining present. Slough has built up on the  surface. We are still awaiting snap VAC approval. 05/17/2022: The wound is a little bit smaller today. There is less slough on the surface, but the granulation tissue still is not very robust. She has been approved for snap VAC but will have a 20% coinsurance and it is not clear what that amount would end up being for her. Patient History Information obtained from Patient. Family  History Cancer - Mother, Diabetes - Father, Hypertension - Mother, Kidney Disease - Mother, Lung Disease - Father, Thyroid Problems - Paternal Grandparents, No family history of Heart Disease, Seizures, Stroke, Tuberculosis. Social History Never smoker, Marital Status - Married, Alcohol Use - Never, Drug Use - No History, Caffeine Use - Daily - coffee. Medical History Eyes Denies history of Cataracts, Glaucoma, Optic Neuritis Ear/Nose/Mouth/Throat Denies history of Chronic sinus problems/congestion, Middle ear problems Hematologic/Lymphatic Patient has history of Anemia - iron Denies history of Hemophilia, Human Immunodeficiency Virus, Lymphedema, Sickle Cell Disease Respiratory Patient has history of Sleep Apnea - CPAP Denies history of Aspiration, Asthma, Chronic Obstructive Pulmonary Disease (COPD), Pneumothorax, Tuberculosis Cardiovascular Patient has history of Hypertension, Peripheral Venous Disease Denies history of Angina, Arrhythmia, Congestive Heart Failure, Coronary Artery Disease, Deep Vein Thrombosis, Hypotension, Myocardial Infarction, Peripheral Arterial Disease, Phlebitis, Vasculitis Gastrointestinal [name], [name] ([id number] Page 7 of 10 Denies history of Cirrhosis , Colitis, Crohnoos, Hepatitis A, Hepatitis B, Hepatitis C Endocrine Denies history of Type I Diabetes, Type II Diabetes Genitourinary Denies history of End Stage Renal Disease Immunological Denies history of Lupus Erythematosus, Raynaudoos, Scleroderma Integumentary (Skin) Denies history of History of Burn Musculoskeletal Denies history of Gout, Rheumatoid Arthritis, Osteoarthritis, Osteomyelitis Neurologic Denies history of Dementia, Neuropathy, Quadriplegia, Paraplegia, Seizure Disorder Oncologic Denies history of Received Chemotherapy, Received Radiation Psychiatric Denies history of Anorexia/bulimia, Confinement Anxiety Hospitalization/Surgery  History - cholecystectomy 1980s. - nephrolithasis 1980s. - plate and rod in right elbow surgery 2010. 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 Vitals Time Taken: 2:55 PM, Temperature: 97.9 F, Pulse: 98 bpm, Respiratory Rate: 18 breaths/min, Blood Pressure: 137/70 mmHg. Respiratory Normal work of breathing on room air. General Notes: 05/17/2022: The wound is a little bit smaller today. There is less slough on the surface, but the granulation tissue still is not very robust. Integumentary (Hair, Skin) Wound #2 status is Open. Original cause of wound was Insect Bite. The date acquired was: 11/21/2021. The wound has been in treatment 16 weeks. The wound is located on the Right,Posterior Lower Leg. The wound measures 2.4cm length x 2.2cm width x 0.2cm depth; 4.147cm^2 area and 0.829cm^3 volume. There is Fat Layer (Subcutaneous Tissue) exposed. There is no tunneling noted, however, there is undermining starting at 10:00 and ending at 2:00 with a maximum distance of 0.4cm. There is a medium amount of serosanguineous drainage noted. The wound margin is distinct with the outline attached to the wound base. There is medium (34-66%) red, friable granulation within the wound bed. There is a medium (34-66%) amount of necrotic tissue within the wound bed including Adherent Slough. The periwound skin appearance had no abnormalities noted for moisture. The periwound skin appearance exhibited: Scarring, Hemosiderin Staining, Erythema. The periwound skin appearance did not exhibit: Excoriation, Atrophie Blanche, Ecchymosis, Rubor. The surrounding wound skin color is noted with erythema. Periwound temperature was noted as No Abnormality. Assessment Active Problems ICD-10 Non-pressure chronic ulcer of right calf with fat layer exposed Morbid (severe) obesity due to excess  calories Essential (primary) hypertension Localized edema Procedures Wound #2 Pre-procedure diagnosis of Wound #2 is a Venous Leg Ulcer located on the Right,Posterior Lower Leg .Severity of Tissue Pre Debridement is: Fat layer exposed. There was a Excisional Skin/Subcutaneous Tissue Debridement with a total area of 5.28 sq cm performed by Fredirick Maudlin, MD. With the following instrument(s): Curette to remove Viable and Non-Viable tissue/material. Material removed includes Subcutaneous Tissue and Slough and after achieving pain control using Lidocaine 5% topical ointment. No specimens were taken. A time out was conducted at 15:16, prior to the start of the procedure. A Minimum amount of bleeding was controlled with Pressure. The procedure was tolerated well. Post Debridement Measurements: 2.4cm length x 2.2cm width x 0.1cm depth; 0.415cm^3 volume. Character of Wound/Ulcer Post Debridement requires further debridement. Severity of Tissue Post Debridement is: Fat layer exposed. Post procedure Diagnosis Wound #2: Same as Pre-Procedure [name], [name] ([id number]) [id number] Page 8 of 10 General Notes: Scribed for Dr. Celine Ahr by [name], RN. Pre-procedure diagnosis of Wound #2 is a Venous Leg Ulcer located on the Right,Posterior Lower Leg . There was a Three Layer Compression Therapy Procedure by [name], RN. Post procedure Diagnosis Wound #2: Same as Pre-Procedure Plan Follow-up Appointments: Return Appointment in 1 week. - Dr. Celine Ahr - Room 2 Anesthetic: (In clinic) Topical Lidocaine 5% applied to wound bed Bathing/ Shower/ Hygiene: May shower with protection but do not get wound dressing(s) wet. Protect dressing(s) with water repellant cover (for example, large plastic bag) or a cast cover and may then take shower. Edema Control - Lymphedema / SCD / Other: Avoid standing for long periods of time. Exercise regularly Moisturize legs  daily. Compression stocking or Garment 20-30 mm/Hg pressure to: - left leg daily. Apply first thing in the morning, remove at night. WOUND #2: - Lower Leg Wound Laterality: Right, Posterior Cleanser: Soap and Water 1 x Per Week/30 Days Discharge Instructions: May shower and wash wound with dial antibacterial soap and water prior to dressing change. Cleanser: Wound Cleanser 1 x Per Week/30 Days Discharge Instructions: Cleanse the wound with wound cleanser prior to applying a clean dressing using gauze sponges, not tissue or cotton balls. Peri-Wound Care: Triamcinolone 15 (g) 1 x Per Week/30 Days Discharge Instructions: Use triamcinolone 15 (g) as directed Peri-Wound Care: Sween Lotion (Moisturizing lotion) 1 x Per Week/30 Days Discharge Instructions: Apply moisturizing lotion as directed Topical: Skintegrity Hydrogel 4 (oz) 1 x Per Week/30 Days Discharge Instructions: Apply hydrogel as directed Topical: keystone 1 x Per Week/30 Days Prim Dressing: Promogran Prisma Matrix, 4.34 (sq in) (silver collagen) 1 x Per Week/30 Days ary Discharge Instructions: Moisten collagen with saline or hydrogel Secondary Dressing: Woven Gauze Sponge, Non-Sterile 4x4 in 1 x Per Week/30 Days Discharge Instructions: Apply over primary dressing as directed. Secondary Dressing: Zetuvit Plus 4x8 in 1 x Per Week/30 Days Discharge Instructions: Apply over primary dressing as directed. Com pression Wrap: ThreePress (3 layer compression wrap) 1 x Per Week/30 Days Discharge Instructions: Apply three layer compression as directed. Com pression Wrap: Unnaboot w/Calamine, 4x10 (in/yd) 1 x Per Week/30 Days Discharge Instructions: Apply Unnaboot at top of leg 05/17/2022: The wound is a little bit smaller today. There is less slough on the surface, but the granulation tissue still is not very robust. I used a curette to debride slough and nonviable subcutaneous tissue from the wound. We will continue Keystone topical antibiotic  compound with Prisma silver collagen. Continue 3 layer compression. Apparently the exact cost of the snap VAC  is privileged information, so she will reach out to her insurance company to see if she can get an exact number on this as I think she would benefit, due to the undermining in her wound. Follow-up in 1 week. Electronic Signature(s) Signed: 05/26/2022 2:19:36 PM By: Deon Pilling RN, BSN Signed: 06/03/2022 5:14:33 PM By: Fredirick Maudlin MD FACS Previous Signature: 05/17/2022 3:36:07 PM Version By: Fredirick Maudlin MD FACS Entered By: Deon Pilling on 05/26/2022 14:14:07 -------------------------------------------------------------------------------- HxROS Details Patient Name: Date of Service: [name], [name] 05/17/2022 2:45 PM Medical Record Number: [id number] Patient Account Number: [id number] Date of Birth/Sex: Treating RN: 06/14/1960 (62 y.o. F) Primary Care Provider: Loralyn Freshwater Other Clinician: Referring Provider: Treating Provider/Extender: Chesley Noon in Treatment: 16 Information Obtained From Patient Eyes Medical History: Negative for: Cataracts; Glaucoma; Optic Neuritis [name], SAWINSKI ([id number]) [phone].pdf Page 9 of 10 Ear/Nose/Mouth/Throat Medical History: Negative for: Chronic sinus problems/congestion; Middle ear problems Hematologic/Lymphatic Medical History: Positive for: Anemia - iron Negative for: Hemophilia; Human Immunodeficiency Virus; Lymphedema; Sickle Cell Disease Respiratory Medical History: Positive for: Sleep Apnea - CPAP Negative for: Aspiration; Asthma; Chronic Obstructive Pulmonary Disease (COPD); Pneumothorax; Tuberculosis Cardiovascular Medical History: Positive for: Hypertension; Peripheral Venous Disease Negative for: Angina; Arrhythmia; Congestive Heart Failure; Coronary Artery Disease; Deep Vein Thrombosis; Hypotension; Myocardial Infarction; Peripheral Arterial Disease;  Phlebitis; Vasculitis Gastrointestinal Medical History: Negative for: Cirrhosis ; Colitis; Crohns; Hepatitis A; Hepatitis B; Hepatitis C Endocrine Medical History: Negative for: Type I Diabetes; Type II Diabetes Genitourinary Medical History: Negative for: End Stage Renal Disease Past Medical History Notes: years ago kidney stones Immunological Medical History: Negative for: Lupus Erythematosus; Raynauds; Scleroderma Integumentary (Skin) Medical History: Negative for: History of Burn Musculoskeletal Medical History: Negative for: Gout; Rheumatoid Arthritis; Osteoarthritis; Osteomyelitis Neurologic Medical History: Negative for: Dementia; Neuropathy; Quadriplegia; Paraplegia; Seizure Disorder Oncologic Medical History: Negative for: Received Chemotherapy; Received Radiation Past Medical History Notes: skin Ca removed from back years ago Psychiatric Medical History: Negative for: Anorexia/bulimia; Confinement Anxiety Immunizations Pneumococcal Vaccine: Received Pneumococcal Vaccination: No AMELIANNA, MITRANI ([id number] Page 10 of 10 Implantable Devices None Hospitalization / Surgery History Type of Hospitalization/Surgery cholecystectomy 1980s nephrolithasis 1980s plate and rod in right elbow surgery 2010 Family and Social History Cancer: Yes - Mother; Diabetes: Yes - Father; Heart Disease: No; Hypertension: Yes - Mother; Kidney Disease: Yes - Mother; Lung Disease: Yes - Father; Seizures: No; Stroke: No; Thyroid Problems: Yes - Paternal Grandparents; Tuberculosis: No; Never smoker; Marital Status - Married; Alcohol Use: Never; Drug Use: No History; Caffeine Use: Daily - coffee; Financial Concerns: No; Food, Clothing or Shelter Needs: No; Support System Lacking: No; Transportation Concerns: No Electronic Signature(s) Signed: 05/17/2022 3:43:44 PM By: Fredirick Maudlin MD FACS Entered By: Fredirick Maudlin on 05/17/2022  15:33:30 -------------------------------------------------------------------------------- SuperBill Details Patient Name: Date of Service: [name], [name] 05/17/2022 Medical Record Number: [id number] Patient Account Number: [id number] Date of Birth/Sex: Treating RN: 04-05-1961 (62 y.o. F) Primary Care Provider: Loralyn Freshwater Other Clinician: Referring Provider: Treating Provider/Extender: Chesley Noon in Treatment: 16 Diagnosis Coding ICD-10 Codes Code Description [phone] Non-pressure chronic ulcer of right calf with fat layer exposed E66.01 Morbid (severe) obesity due to excess calories I10 Essential (primary) hypertension R60.0 Localized edema Facility Procedures : CPT4 Code: [id number] Description: 11042 - DEB SUBQ TISSUE 20 SQ CM/< ICD-10 Diagnosis Description L97.212 Non-pressure chronic ulcer of right calf with fat layer exposed Modifier: Quantity: 1 Physician Procedures : CPT4 Code Description Modifier [id number] - WC PHYS  LEVEL 4 - EST PT 25 ICD-10 Diagnosis Description L97.212 Non-pressure chronic ulcer of right calf with fat layer exposed R60.0 Localized edema E66.01 Morbid (severe) obesity due to excess calories  I10 Essential (primary) hypertension Quantity: 1 : [id number] 11042 - WC PHYS SUBQ TISS 20 SQ CM ICD-10 Diagnosis Description L97.212 Non-pressure chronic ulcer of right calf with fat layer exposed Quantity: 1 Electronic Signature(s) Signed: 05/17/2022 3:39:23 PM By: Fredirick Maudlin MD FACS Entered By: Fredirick Maudlin on 05/17/2022 15:39:22

## 2022-05-23 ENCOUNTER — Encounter: Payer: Self-pay | Admitting: Nurse Practitioner

## 2022-05-23 ENCOUNTER — Ambulatory Visit (INDEPENDENT_AMBULATORY_CARE_PROVIDER_SITE_OTHER): Payer: BC Managed Care – PPO | Admitting: Pulmonary Disease

## 2022-05-23 ENCOUNTER — Ambulatory Visit: Payer: BC Managed Care – PPO | Admitting: Nurse Practitioner

## 2022-05-23 VITALS — BP 114/76 | HR 83 | Ht 61.0 in | Wt 277.4 lb

## 2022-05-23 DIAGNOSIS — R0609 Other forms of dyspnea: Secondary | ICD-10-CM | POA: Diagnosis not present

## 2022-05-23 DIAGNOSIS — G4733 Obstructive sleep apnea (adult) (pediatric): Secondary | ICD-10-CM

## 2022-05-23 DIAGNOSIS — J984 Other disorders of lung: Secondary | ICD-10-CM

## 2022-05-23 LAB — PULMONARY FUNCTION TEST
DL/VA % pred: 108 %
DL/VA: 4.63 ml/min/mmHg/L
DLCO cor % pred: 85 %
DLCO cor: 15.48 ml/min/mmHg
DLCO unc % pred: 85 %
DLCO unc: 15.48 ml/min/mmHg
FEF 25-75 Post: 1.75 L/sec
FEF 25-75 Pre: 1.27 L/sec
FEF2575-%Change-Post: 37 %
FEF2575-%Pred-Post: 85 %
FEF2575-%Pred-Pre: 61 %
FEV1-%Change-Post: 8 %
FEV1-%Pred-Post: 67 %
FEV1-%Pred-Pre: 62 %
FEV1-Post: 1.48 L
FEV1-Pre: 1.37 L
FEV1FVC-%Change-Post: 1 %
FEV1FVC-%Pred-Pre: 102 %
FEV6-%Change-Post: 7 %
FEV6-%Pred-Post: 67 %
FEV6-%Pred-Pre: 62 %
FEV6-Post: 1.85 L
FEV6-Pre: 1.73 L
FEV6FVC-%Pred-Post: 104 %
FEV6FVC-%Pred-Pre: 104 %
FVC-%Change-Post: 7 %
FVC-%Pred-Post: 64 %
FVC-%Pred-Pre: 60 %
FVC-Post: 1.85 L
FVC-Pre: 1.73 L
Post FEV1/FVC ratio: 80 %
Post FEV6/FVC ratio: 100 %
Pre FEV1/FVC ratio: 79 %
Pre FEV6/FVC Ratio: 100 %
RV % pred: 65 %
RV: 1.24 L
TLC % pred: 76 %
TLC: 3.53 L

## 2022-05-23 LAB — POCT EXHALED NITRIC OXIDE: FeNO level (ppb): <5

## 2022-05-23 MED ORDER — FLUTICASONE FUROATE-VILANTEROL 100-25 MCG/ACT IN AEPB: 1.0000 | INHALATION_SPRAY | Freq: Every day | RESPIRATORY_TRACT | 5 refills | Status: DC

## 2022-05-23 NOTE — Progress Notes (Signed)
$@PatientB$  ID: [name], female    DOB: 12-26-1960, 62 y.o.   MRN: [id number]  Chief Complaint  Patient presents with   Follow-up    Pt f/u after PFT to get results read. She is also requesting CPAP supplies for her and her  spouse    Referring provider: Farrel Conners, MD  HPI: 62 year old female, never smoker followed for OSA on CPAP and DOE.  She is patient Dr. Bari Mantis and last seen in office 07/03/2020.  Past medical history significant for hypertension, hypothyroid, osteoporosis.  TEST/EVENTS:  2015 [facility] [place]: Severe OSA, AHI 80/h  07/03/2020: OV with Dr. Elsworth Soho.  She was diagnosed around 5 years ago with a sleep study done in [place] which showed AHI of 80/h.  Still using old supplies.  Has not renewed this for a year and a half.  Reports leak around her machine.  Wakes up with dryness of the mouth.  No headaches.  Very compliant.  Plan to set her up with a local DME to get her new CPAP supplies including filter, hose, new airFit F20 small fullface mask.  CPAP settings are set at 7-12 cmH2O.  Seem to be working well for her.  Average pressure of 9 cm.  Large leak is likely related to old mask and interface wearing off.  She does complain of some dyspnea on exertion.  Likely related to obesity and deconditioning.  04/26/2022: OV with Saydee Zolman NP for follow-up with her husband, who I am also seeing as a patient.  She has been doing well with her CPAP since she was here last almost 2 years ago.  She wears it nightly.  Not having any difficulties.  Is having some leaks, which she attributes to an old mask.  Usually these correct when she switches her supplies out.  Her machine is relatively old.  She has not gotten a new 1 since her initial study around 8 or so years ago.  It seems to be working okay for right now.  She would like to see if a new 1 would be something that is affordable for her.  Denies drowsy driving, morning headaches, excessive daytime fatigue.  She has been having  some more trouble with shortness of breath on exertion.  She says that after long distances, stairs or uphill climbing she tends to get winded and has to rest to catch her breath.  This has been ongoing for a long time now but her PCP recently had something to her about it.  She thinks that it might have gotten worse over the last few years.  She also notices some associated wheezing at times.  Denies any cough, chest congestion, fevers, night sweats, lower extremity swelling, orthopnea, PND.  She does not have a history of childhood asthma.  No recurrent bronchitis.  No significant occupational or environmental exposures.  Does not notice that she really has any environmental allergies.  She was exposed to secondhand smoke for a long time.  Her father had COPD.  She is a never smoker.  05/23/2022: Today - follow up Patient presents today for follow-up after undergoing pulmonary function testing which revealed a moderate restrictive defect and normal diffusing capacity.  No formal diagnosis of obstruction.  She did have some mid flow reversibility.  She tells me today that she never really tried the albuterol at home.  She does not recall the pharmacy of her calling to tell her if this was available.  She did notice that after she  used the albuterol during her PFT today she felt her chest was a bit more open.  She continues to have trouble with shortness of breath upon exertion and some associated wheezing.  Occasionally has some trouble with taking a deep breath in.  She denies any fevers, chills, chest congestion, night sweats, cough, lower extremity swelling, orthopnea, PND.  She is very compliant with her CPAP therapy.  She was exposed to secondhand smoke for a long time.  Does not have any significant occupational or environmental exposures.  She does not have any systemic symptoms of autoimmune type illnesses.  FeNO <5 ppb   Allergies  Allergen Reactions   Ivp Dye [Iodinated Contrast Media]     Hives,  hot   Sulfa Antibiotics     Hives, hot flashes    Immunization History  Administered Date(s) Administered   Influenza,inj,Quad PF,6+ Mos 02/03/2019, 05/03/2020, 03/28/2021, 02/18/2022   PFIZER(Purple Top)SARS-COV-2 Vaccination 06/04/2019, 06/26/2019   Zoster Recombinat (Shingrix) 02/03/2019    Past Medical History:  Diagnosis Date   Basal cell carcinoma (BCC) in situ of skin    Hypertension    Hypothyroid    Nephrolithiasis    OSA (obstructive sleep apnea)     Tobacco History: Social History   Tobacco Use  Smoking Status Never  Smokeless Tobacco Never   Counseling given: Not Answered   Outpatient Medications Prior to Visit  Medication Sig Dispense Refill   acetaminophen (TYLENOL) 500 MG tablet Take 500 mg by mouth as needed.     alendronate (FOSAMAX) 70 MG tablet Take 1 tablet (70 mg total) by mouth every 7 (seven) days. Take with a full glass of water on an empty stomach. 12 tablet 3   Calcium Carb-Cholecalciferol (CALCIUM 600 + D) 600-200 MG-UNIT TABS Take 1 tablet by mouth daily. 30 tablet 0   diphenhydrAMINE (BENADRYL) 25 MG tablet Take 25 mg by mouth as needed.     diphenhydramine-acetaminophen (TYLENOL PM) 25-500 MG TABS tablet Take 1 tablet by mouth at bedtime as needed.     ELDERBERRY PO Take by mouth.     famotidine (PEPCID) 20 MG tablet Take 20 mg by mouth daily as needed for heartburn or indigestion.     Ferrous Sulfate Dried (FERROUS SULFATE IRON) 200 (65 Fe) MG TABS Take 1 tablet by mouth daily. 30 tablet    furosemide (LASIX) 20 MG tablet Take 1 tablet (20 mg total) by mouth daily. 90 tablet 1   hydrochlorothiazide (HYDRODIURIL) 50 MG tablet Take 1 tablet (50 mg total) by mouth daily. 90 tablet 1   levothyroxine (SYNTHROID) 175 MCG tablet TAKE 1 TABLET BY MOUTH ONCE DAILY BEFORE BREAKFAST. APPOINTMENT REQUIRED FOR FUTURE REFILLS 90 tablet 1   lidocaine (XYLOCAINE) 5 % ointment Apply 1 Application topically as needed. 50 g 0   losartan (COZAAR) 100 MG tablet  TAKE 1 TABLET BY MOUTH ONCE DAILY . APPOINTMENT REQUIRED FOR FUTURE REFILLS 90 tablet 1   mirabegron ER (MYRBETRIQ) 25 MG TB24 tablet Take 1 tablet (25 mg total) by mouth daily. 14 tablet 0   Multiple Vitamins-Minerals (ONE-A-DAY MENOPAUSE FORMULA) TABS Take 1 tablet by mouth daily.     naproxen sodium (ALEVE) 220 MG tablet Take 1 tablet (220 mg total) by mouth 2 (two) times daily as needed. (Patient taking differently: Take 440 mg by mouth daily as needed.)     OVER THE COUNTER MEDICATION OTC antacid as needed (cannot recall name)     vitamin B-12 (CYANOCOBALAMIN) 500 MCG tablet Take 500 mcg  by mouth daily.     No facility-administered medications prior to visit.     Review of Systems:   Constitutional: No weight loss or gain, night sweats, fevers, chills, fatigue, or lassitude. HEENT: No headaches, difficulty swallowing, tooth/dental problems, or sore throat. No sneezing, itching, ear ache, nasal congestion, or post nasal drip CV:  No chest pain, orthopnea, PND, swelling in lower extremities, anasarca, dizziness, palpitations, syncope Resp: +shortness of breath with exertion; occasional wheezing. No excess mucus or change in color of mucus. No productive or non-productive. No hemoptysis. No chest wall deformity GI:  No heartburn, indigestion, abdominal pain, nausea, vomiting, diarrhea,  loss of appetite GU: No dysuria, change in color of urine, urgency or frequency.   Skin: No rash, lesions, ulcerations MSK:  No joint pain or swelling.   Neuro: No dizziness or lightheadedness.  Psych: No depression or anxiety. Mood stable.     Physical Exam:  BP 114/76   Pulse 83   Ht 5' 1"$  (1.549 m)   Wt 277 lb 6.4 oz (125.8 kg)   SpO2 98%   BMI 52.41 kg/m   GEN: Pleasant, interactive, well-appearing; morbidly obese; in no acute distress. HEENT:  Normocephalic and atraumatic. PERRLA. Sclera white. Nasal turbinates pink, moist and patent bilaterally. No rhinorrhea present. Oropharynx pink and  moist, without exudate or edema. No lesions, ulcerations, or postnasal drip. Mallampati III NECK:  Supple w/ fair ROM. No JVD present. Normal carotid impulses w/o bruits. Thyroid symmetrical with no goiter or nodules palpated. No lymphadenopathy.   CV: RRR, no m/r/g, no peripheral edema. Pulses intact, +2 bilaterally. No cyanosis, pallor or clubbing. PULMONARY:  Unlabored, regular breathing. Clear bilaterally A&P w/o wheezes/rales/rhonchi. No accessory muscle use.  GI: BS present and normoactive. Soft, non-tender to palpation. No organomegaly or masses detected. MSK: No erythema, warmth or tenderness. Cap refil <2 sec all extrem. No deformities or joint swelling noted.  Neuro: A/Ox3. No focal deficits noted.   Skin: Warm, no lesions or rashe Psych: Normal affect and behavior. Judgement and thought content appropriate.     Lab Results:  CBC    Component Value Date/Time   WBC 7.0 03/28/2021 1037   RBC 3.80 (L) 03/28/2021 1037   HGB 12.1 03/28/2021 1037   HCT 36.2 03/28/2021 1037   PLT 190.0 03/28/2021 1037   MCV 95.2 03/28/2021 1037   MCHC 33.5 03/28/2021 1037   RDW 13.2 03/28/2021 1037   LYMPHSABS 2.0 03/28/2021 1037   MONOABS 0.5 03/28/2021 1037   EOSABS 0.2 03/28/2021 1037   BASOSABS 0.0 03/28/2021 1037    BMET    Component Value Date/Time   NA 138 02/27/2022 0758   K 4.2 02/27/2022 0758   CL 101 02/27/2022 0758   CO2 31 02/27/2022 0758   GLUCOSE 96 02/27/2022 0758   BUN 21 02/27/2022 0758   CREATININE 0.84 02/27/2022 0758   CALCIUM 9.7 02/27/2022 0758    BNP No results found for: "BNP"   Imaging:  No results found.       Latest Ref Rng & Units 05/23/2022    2:50 PM  PFT Results  FVC-Pre L 1.73  P  FVC-Predicted Pre % 60  P  FVC-Post L 1.85  P  FVC-Predicted Post % 64  P  Pre FEV1/FVC % % 79  P  Post FEV1/FCV % % 80  P  FEV1-Pre L 1.37  P  FEV1-Predicted Pre % 62  P  FEV1-Post L 1.48  P  DLCO uncorrected ml/min/mmHg 15.48  P  DLCO UNC% % 85  P   DLCO corrected ml/min/mmHg 15.48  P  DLCO COR %Predicted % 85  P  DLVA Predicted % 108  P  TLC L 3.53  P  TLC % Predicted % 76  P  RV % Predicted % 65  P    P Preliminary result    No results found for: "NITRICOXIDE"      Assessment & Plan:   Dyspnea on exertion Given her response to albuterol and mid flow reversibility, question if there is a component of reactive airway disease.  We will trial her on ICS/LABA therapy to see if she has any perceived benefit.  She did have a notable restrictive defect on pulmonary function testing, which is likely related to obesity due to normal diffusing capacity.  However, given her insidious onset without significant weight change over the past few years, recommended further workup to rule out interstitial lung disease.  She has never had imaging of her chest.  HRCT ordered for further evaluation.  Plan to review at follow-up.  If imaging is unremarkable and she does not have any response to ICS/LABA therapy, may consider echocardiogram for further evaluation as she is higher risk for cardiac component given comorbidities. Action plan in place. Return precautions advised.  Patient Instructions  Continue to use CPAP every night, minimum of 4-6 hours a night.  Change equipment every 30 days or as directed by DME. Wash your tubing with warm soap and water daily, hang to dry. Wash humidifier portion weekly.  Be aware of reduced alertness and do not drive or operate heavy machinery if experiencing this or drowsiness.  Exercise encouraged, as tolerated. Notify if persistent daytime sleepiness occurs even with consistent use of CPAP.   Orders placed for supplies. Let me know if the leaks persist once you get a new mask    Trial Breo 1 puff daily. Brush tongue and rinse mouth afterwards.  High resolution CT chest - someone will contact you for scheduling this  Follow up with Dr. Elsworth Soho or Roxan Diesel, NP in 6 weeks to see how inhaler is working. If  symptoms do not improve or worsen, please contact office for sooner follow up or seek emergency care.    OSA on CPAP Excellent compliance. She never received new supplies after our last visit. Will follow up on this today.    I spent 35 minutes of dedicated to the care of this patient on the date of this encounter to include pre-visit review of records, face-to-face time with the patient discussing conditions above, post visit ordering of testing, clinical documentation with the electronic health record, making appropriate referrals as documented, and communicating necessary findings to members of the patients care team.  Clayton Bibles, NP 05/23/2022  Pt aware and understands NP's role.

## 2022-05-23 NOTE — Progress Notes (Signed)
Full PFT completed today 

## 2022-05-23 NOTE — Patient Instructions (Addendum)
Continue to use CPAP every night, minimum of 4-6 hours a night.  Change equipment every 30 days or as directed by DME. Wash your tubing with warm soap and water daily, hang to dry. Wash humidifier portion weekly.  Be aware of reduced alertness and do not drive or operate heavy machinery if experiencing this or drowsiness.  Exercise encouraged, as tolerated. Notify if persistent daytime sleepiness occurs even with consistent use of CPAP.   Orders placed for supplies. Let me know if the leaks persist once you get a new mask    Trial Breo 1 puff daily. Brush tongue and rinse mouth afterwards.  High resolution CT chest - someone will contact you for scheduling this  Follow up with Dr. Elsworth Soho or Roxan Diesel, NP in 6 weeks to see how inhaler is working. If symptoms do not improve or worsen, please contact office for sooner follow up or seek emergency care.

## 2022-05-23 NOTE — Assessment & Plan Note (Signed)
Excellent compliance. She never received new supplies after our last visit. Will follow up on this today.

## 2022-05-23 NOTE — Assessment & Plan Note (Addendum)
Given her response to albuterol and mid flow reversibility, question if there is a component of reactive airway disease.  We will trial her on ICS/LABA therapy to see if she has any perceived benefit.  She did have a notable restrictive defect on pulmonary function testing, which is likely related to obesity due to normal diffusing capacity.  However, given her insidious onset without significant weight change over the past few years, recommended further workup to rule out interstitial lung disease.  She has never had imaging of her chest.  HRCT ordered for further evaluation.  Plan to review at follow-up.  If imaging is unremarkable and she does not have any response to ICS/LABA therapy, may consider echocardiogram for further evaluation as she is higher risk for cardiac component given comorbidities. Action plan in place. Return precautions advised.  Patient Instructions  Continue to use CPAP every night, minimum of 4-6 hours a night.  Change equipment every 30 days or as directed by DME. Wash your tubing with warm soap and water daily, hang to dry. Wash humidifier portion weekly.  Be aware of reduced alertness and do not drive or operate heavy machinery if experiencing this or drowsiness.  Exercise encouraged, as tolerated. Notify if persistent daytime sleepiness occurs even with consistent use of CPAP.   Orders placed for supplies. Let me know if the leaks persist once you get a new mask    Trial Breo 1 puff daily. Brush tongue and rinse mouth afterwards.  High resolution CT chest - someone will contact you for scheduling this  Follow up with Dr. Elsworth Soho or Roxan Diesel, NP in 6 weeks to see how inhaler is working. If symptoms do not improve or worsen, please contact office for sooner follow up or seek emergency care.

## 2022-05-24 ENCOUNTER — Ambulatory Visit (HOSPITAL_BASED_OUTPATIENT_CLINIC_OR_DEPARTMENT_OTHER): Payer: BC Managed Care – PPO | Admitting: General Surgery

## 2022-05-24 ENCOUNTER — Encounter (HOSPITAL_BASED_OUTPATIENT_CLINIC_OR_DEPARTMENT_OTHER): Payer: BC Managed Care – PPO | Admitting: General Surgery

## 2022-05-27 ENCOUNTER — Encounter (HOSPITAL_BASED_OUTPATIENT_CLINIC_OR_DEPARTMENT_OTHER): Payer: BC Managed Care – PPO | Admitting: General Surgery

## 2022-05-27 DIAGNOSIS — L97212 Non-pressure chronic ulcer of right calf with fat layer exposed: Secondary | ICD-10-CM | POA: Diagnosis not present

## 2022-05-28 NOTE — Progress Notes (Signed)
[name], [name] ([id number]) [id number] Page 1 of 8 Visit Report for 05/27/2022 Arrival Information Details Patient Name: Date of Service: [place], [name] 05/27/2022 3:30 PM Medical Record Number: [id number] Patient Account Number: [id number] Date of Birth/Sex: Treating RN: Nov 02, 1960 (62 y.o. F) Primary Care Jazelle Achey: Loralyn Freshwater Other Clinician: Referring Deo Mehringer: Treating Jamarii Banks/Extender: Chesley Noon in Treatment: 82 Visit Information History Since Last Visit All ordered tests and consults were completed: No Patient Arrived: Ambulatory Added or deleted any medications: No Arrival Time: 15:42 Any new allergies or adverse reactions: No Accompanied By: self Had a fall or experienced change in No Transfer Assistance: None activities of daily living that may affect Patient Identification Verified: Yes risk of falls: Secondary Verification Process Completed: Yes Signs or symptoms of abuse/neglect since last visito No Patient Requires Transmission-Based Precautions: No Hospitalized since last visit: No Patient Has Alerts: No Implantable device outside of the clinic excluding No cellular tissue based products placed in the center since last visit: Has Dressing in Place as Prescribed: Yes Has Compression in Place as Prescribed: Yes Pain Present Now: No Electronic Signature(s) Signed: 05/27/2022 5:38:46 PM By: Baruch Gouty RN, BSN Entered By: Baruch Gouty on 05/27/2022 15:47:03 -------------------------------------------------------------------------------- Compression Therapy Details Patient Name: Date of Service: [name], [name] 05/27/2022 3:30 PM Medical Record Number: [id number] Patient Account Number: [id number] Date of Birth/Sex: Treating RN: [birth year] (62 y.o. [name] Primary Care Makynli Stills: Loralyn Freshwater Other Clinician: Referring Leonard Feigel: Treating Bassel Gaskill/Extender: Chesley Noon in Treatment: 17 Compression Therapy Performed for Wound Assessment: Wound #2 Right,Posterior Lower Leg Performed By: Clinician Baruch Gouty, RN Compression Type: Three Layer Post Procedure Diagnosis Same as Pre-procedure Electronic Signature(s) Signed: 05/27/2022 5:38:46 PM By: Baruch Gouty RN, BSN Entered By: Baruch Gouty on 05/27/2022 16:05:17 [name] ([id number] Page 2 of 8 -------------------------------------------------------------------------------- Encounter Discharge Information Details Patient Name: Date of Service: [name] 05/27/2022 3:30 PM Medical Record Number: [id number] Patient Account Number: [id number] Date of Birth/Sex: Treating RN: [birth year] (62 y.o. [name] Primary Care Phyliss Hulick: Loralyn Freshwater Other Clinician: Referring Gwenna Fuston: Treating Aniza Shor/Extender: Chesley Noon in Treatment: 17 Encounter Discharge Information Items Post Procedure Vitals Discharge Condition: Stable Temperature (F): 98.3 Ambulatory Status: Ambulatory Pulse (bpm): 102 Discharge Destination: Home Respiratory Rate (breaths/min): 18 Transportation: Private Auto Blood Pressure (mmHg): 105/55 Accompanied By: self Schedule Follow-up Appointment: Yes Clinical Summary of Care: Patient Declined Electronic Signature(s) Signed: 05/27/2022 5:38:46 PM By: Baruch Gouty RN, BSN Entered By: Baruch Gouty on 05/27/2022 16:27:14 -------------------------------------------------------------------------------- Lower Extremity Assessment Details Patient Name: Date of Service: [name], [name] 05/27/2022 3:30 PM Medical Record Number: [id number] Patient Account Number: [id number] Date of Birth/Sex: Treating RN: [birth year] (62 y.o. [name] Primary Care Izan Miron: Loralyn Freshwater Other Clinician: Referring Jenniferlynn Saad: Treating Perkins Molina/Extender:  Chesley Noon in Treatment: 17 Edema Assessment Assessed: [name]: No] [Right: No] Edema: [Left: Ye] [Right: s] Calf Left: Right: Point of Measurement: From Medial Instep 42.3 cm Ankle Left: Right: Point of Measurement: From Medial Instep 24 cm Vascular Assessment Pulses: Dorsalis Pedis Palpable: [Right:Yes] Electronic Signature(s) Signed: 05/27/2022 5:38:46 PM By: Baruch Gouty RN, BSN Entered By: Baruch Gouty on 05/27/2022 [name], [name] ([id number] Page 3 of 8 -------------------------------------------------------------------------------- Multi Wound Chart Details Patient Name: Date of Service: [place], [name] 05/27/2022 3:30 PM Medical Record Number: [id number] Patient Account Number: [id number] Date of Birth/Sex: Treating RN: 1960/06/02 (62 y.o. F) Primary  Care Maddelyn Rocca: Loralyn Freshwater Other Clinician: Referring Ladarrius Bogdanski: Treating Shanea Karney/Extender: Chesley Noon in Treatment: 17 Vital Signs Height(in): 61 Pulse(bpm): 102 Weight(lbs): [id number] Blood Pressure(mmHg): 105/55 Body Mass Index(BMI): 52.3 Temperature(F): 98.3 Respiratory Rate(breaths/min): 20 [2:Photos:] [N/A:N/A] Right, Posterior Lower Leg N/A N/A Wound Location: Insect Bite N/A N/A Wounding Event: Venous Leg Ulcer N/A N/A Primary Etiology: Anemia, Sleep Apnea, Hypertension, N/A N/A Comorbid History: Peripheral Venous Disease 11/21/2021 N/A N/A Date Acquired: 17 N/A N/A Weeks of Treatment: Open N/A N/A Wound Status: No N/A N/A Wound Recurrence: 2.5x2.4x0.6 N/A N/A Measurements L x W x D (cm) 4.712 N/A N/A A (cm) : rea 2.827 N/A N/A Volume (cm) : -1327.90% N/A N/A % Reduction in A rea: -2755.60% N/A N/A % Reduction in Volume: Full Thickness Without Exposed N/A N/A Classification: Support Structures Medium N/A N/A Exudate A mount: Purulent N/A N/A Exudate Type: yellow,  brown, green N/A N/A Exudate Color: Distinct, outline attached N/A N/A Wound Margin: Small (1-33%) N/A N/A Granulation A mount: Red, Friable N/A N/A Granulation Quality: Large (67-100%) N/A N/A Necrotic A mount: Fat Layer (Subcutaneous Tissue): Yes N/A N/A Exposed Structures: Fascia: No Tendon: No Muscle: No Joint: No Bone: No Small (1-33%) N/A N/A Epithelialization: Debridement - Excisional N/A N/A Debridement: Pre-procedure Verification/Time Out 16:00 N/A N/A Taken: Lidocaine 4% Topical Solution N/A N/A Pain Control: Subcutaneous, Slough N/A N/A Tissue Debrided: Skin/Subcutaneous Tissue N/A N/A Level: 6 N/A N/A Debridement A (sq cm): rea Curette N/A N/A Instrument: Minimum N/A N/A Bleeding: Pressure N/A N/A Hemostasis A chieved: 0 N/A N/A Procedural Pain: 0 N/A N/A Post Procedural Pain: Procedure was tolerated well N/A N/A Debridement Treatment Response: 2.5x2.4x0.6 N/A N/A Post Debridement Measurements L x W x D (cm) 2.827 N/A N/A Post Debridement Volume: (cm) Excoriation: No N/A N/A Periwound Skin TextureADRA, MINIERI ([id number] Page 4 of 8 Scarring: No No Abnormalities Noted N/A N/A Periwound Skin Moisture: Hemosiderin Staining: Yes N/A N/A Periwound Skin Color: Atrophie Blanche: No Ecchymosis: No Erythema: No Rubor: No No Abnormality N/A N/A Temperature: Compression Therapy N/A N/A Procedures Performed: Debridement Treatment Notes Wound #2 (Lower Leg) Wound Laterality: Right, Posterior Cleanser Soap and Water Discharge Instruction: May shower and wash wound with dial antibacterial soap and water prior to dressing change. Wound Cleanser Discharge Instruction: Cleanse the wound with wound cleanser prior to applying a clean dressing using gauze sponges, not tissue or cotton balls. Peri-Wound Care Sween Lotion (Moisturizing lotion) Discharge Instruction: Apply moisturizing lotion as  directed Topical Primary Dressing Hydrofera Blue Classic Foam, 2x2 in Discharge Instruction: Moisten with wound cleanser prior to applying to wound bed Secondary Dressing Woven Gauze Sponge, Non-Sterile 4x4 in Discharge Instruction: Apply over primary dressing as directed. Secured With Compression Wrap ThreePress (3 layer compression wrap) Discharge Instruction: Apply three layer compression as directed. Unnaboot w/Calamine, 4x10 (in/yd) Discharge Instruction: Apply Unnaboot at top of leg Compression Stockings Add-Ons Electronic Signature(s) Signed: 05/27/2022 4:44:20 PM By: Fredirick Maudlin MD FACS Entered By: Fredirick Maudlin on 05/27/2022 16:44:20 -------------------------------------------------------------------------------- [name] Details Patient Name: Date of Service: [name], [name] 05/27/2022 3:30 PM Medical Record Number: [id number] Patient Account Number: [id number] Date of Birth/Sex: Treating RN: 19-Feb-1961 (62 y.o. [name] Primary Care Giana Castner: Loralyn Freshwater Other Clinician: Referring Ardean Simonich: Treating Dakari Cregger/Extender: Chesley Noon in Treatment: [place] reviewed with physician Active Inactive Necrotic Tissue [name], [name] ([id number]) [id number] Page 5 of 8 Nursing Diagnoses: Impaired tissue integrity related to necrotic/devitalized tissue Knowledge deficit  related to management of necrotic/devitalized tissue Goals: Necrotic/devitalized tissue will be minimized in the wound bed Date Initiated: 01/23/2022 Target Resolution Date: 07/05/2022 Goal Status: Active Patient/caregiver will verbalize understanding of reason and process for debridement of necrotic tissue Date Initiated: 01/23/2022 Target Resolution Date: 07/05/2022 Goal Status: Active Interventions: Assess patient pain level pre-, during and post procedure and prior to  discharge Provide education on necrotic tissue and debridement process Treatment Activities: Apply topical anesthetic as ordered : 01/23/2022 Notes: Wound/Skin Impairment Nursing Diagnoses: Impaired tissue integrity Knowledge deficit related to ulceration/compromised skin integrity Goals: Patient/caregiver will verbalize understanding of skin care regimen Date Initiated: 01/23/2022 Target Resolution Date: 07/05/2022 Goal Status: Active Interventions: Assess patient/caregiver ability to obtain necessary supplies Assess ulceration(s) every visit Treatment Activities: Skin care regimen initiated : 01/23/2022 Topical wound management initiated : 01/23/2022 Notes: Electronic Signature(s) Signed: 05/27/2022 5:38:46 PM By: Baruch Gouty RN, BSN Entered By: Baruch Gouty on 05/27/2022 15:57:49 -------------------------------------------------------------------------------- Pain Assessment Details Patient Name: Date of Service: [name], [name] 05/27/2022 3:30 PM Medical Record Number: [id number] Patient Account Number: [id number] Date of Birth/Sex: Treating RN: [birth year] (62 y.o. F) Primary Care Hayven Fatima: Loralyn Freshwater Other Clinician: Referring Cienna Dumais: Treating Makylah Bossard/Extender: Chesley Noon in Treatment: 17 Active Problems Location of Pain Severity and Description of Pain Patient Has Paino No Site Locations Rate the pain. [name], [name] ([id number]) [id number] Page 6 of 8 Rate the pain. Current Pain Level: 0 Pain Management and Medication Current Pain Management: Electronic Signature(s) Signed: 05/27/2022 5:38:46 PM By: Baruch Gouty RN, BSN Entered By: Baruch Gouty on 05/27/2022 15:47:20 -------------------------------------------------------------------------------- Patient/Caregiver Education Details Patient Name: Date of Service: [name], Spero Curb DA Lemmie Evens 2/19/2024andnbsp3:30 PM Medical Record Number:  [id number] Patient Account Number: [id number] Date of Birth/Gender: Treating RN: 1960/09/12 (62 y.o. [name] Primary Care Physician: Loralyn Freshwater Other Clinician: Referring Physician: Treating Physician/Extender: Chesley Noon in Treatment: 17 Education Assessment Education Provided To: Patient Education Topics Provided Venous: Methods: Explain/Verbal Responses: Reinforcements needed, State content correctly Wound/Skin Impairment: Methods: Explain/Verbal Responses: Reinforcements needed, State content correctly Electronic Signature(s) Signed: 05/27/2022 5:38:46 PM By: Baruch Gouty RN, BSN Entered By: Baruch Gouty on 05/27/2022 15:58:36 -------------------------------------------------------------------------------- Wound Assessment Details Patient Name: Date of Service: [name], [name] 05/27/2022 3:30 PM [name] ([id number] Page 7 of 8 Medical Record Number: [id number] Patient Account Number: [id number] Date of Birth/Sex: Treating RN: 01/31/1961 (62 y.o. [name] Primary Care Jaselyn Nahm: Loralyn Freshwater Other Clinician: Referring Tavyn Kurka: Treating Drema Eddington/Extender: Chesley Noon in Treatment: 17 Wound Status Wound Number: 2 Primary Venous Leg Ulcer Etiology: Wound Location: Right, Posterior Lower Leg Wound Status: Open Wounding Event: Insect Bite Comorbid Anemia, Sleep Apnea, Hypertension, Peripheral Venous Date Acquired: 11/21/2021 History: Disease Weeks Of Treatment: 17 Clustered Wound: No Photos Wound Measurements Length: (cm) 2.5 Width: (cm) 2.4 Depth: (cm) 0.6 Area: (cm) 4.712 Volume: (cm) 2.827 % Reduction in Area: -1327.9% % Reduction in Volume: -2755.6% Epithelialization: Small (1-33%) Tunneling: No Undermining: No Wound Description Classification: Full Thickness Without Exposed Support Structures Wound Margin: Distinct,  outline attached Exudate Amount: Medium Exudate Type: Purulent Exudate Color: yellow, brown, green Foul Odor After Cleansing: No Slough/Fibrino Yes Wound Bed Granulation Amount: Small (1-33%) Exposed Structure Granulation Quality: Red, Friable Fascia Exposed: No Necrotic Amount: Large (67-100%) Fat Layer (Subcutaneous Tissue) Exposed: Yes Necrotic Quality: Adherent Slough Tendon Exposed: No Muscle Exposed: No Joint Exposed: No Bone Exposed: No Periwound Skin Texture Texture Color No Abnormalities Noted: No No Abnormalities  Noted: No Excoriation: No Atrophie Blanche: No Scarring: No Ecchymosis: No Erythema: No Moisture Hemosiderin Staining: Yes No Abnormalities Noted: Yes Rubor: No Temperature / Pain Temperature: No Abnormality Treatment Notes Wound #2 (Lower Leg) Wound Laterality: Right, Posterior Cleanser Soap and Water Discharge Instruction: May shower and wash wound with dial antibacterial soap and water prior to dressing change. Wound Cleanser Discharge Instruction: Cleanse the wound with wound cleanser prior to applying a clean dressing using gauze sponges, not tissue or cotton balls. Peri-Wound Care [name], [name] ([id number]) [id number] Page 8 of 8 Sween Lotion (Moisturizing lotion) Discharge Instruction: Apply moisturizing lotion as directed Topical Primary Dressing Hydrofera Blue Classic Foam, 2x2 in Discharge Instruction: Moisten with wound cleanser prior to applying to wound bed Secondary Dressing Woven Gauze Sponge, Non-Sterile 4x4 in Discharge Instruction: Apply over primary dressing as directed. Secured With Compression Wrap ThreePress (3 layer compression wrap) Discharge Instruction: Apply three layer compression as directed. Unnaboot w/Calamine, 4x10 (in/yd) Discharge Instruction: Apply Unnaboot at top of leg Compression Stockings Add-Ons Electronic Signature(s) Signed: 05/27/2022 5:38:46 PM By: Baruch Gouty RN,  BSN Entered By: Baruch Gouty on 05/27/2022 15:55:14 -------------------------------------------------------------------------------- [place] Details Patient Name: Date of Service: [name], [name] 05/27/2022 3:30 PM Medical Record Number: [id number] Patient Account Number: [id number] Date of Birth/Sex: Treating RN: 08/31/1960 (62 y.o. F) Primary Care Elwood Bazinet: Loralyn Freshwater Other Clinician: Referring Muaaz Brau: Treating Beniah Magnan/Extender: Chesley Noon in Treatment: 17 Vital Signs Time Taken: 03:43 Temperature (F): 98.3 Height (in): 61 Pulse (bpm): 102 Weight (lbs): 277 Respiratory Rate (breaths/min): 20 Body Mass Index (BMI): 52.3 Blood Pressure (mmHg): 105/55 Reference Range: 80 - 120 mg / dl Electronic Signature(s) Signed: 05/27/2022 5:38:46 PM By: Baruch Gouty RN, BSN Entered By: Baruch Gouty on 05/27/2022 15:47:11

## 2022-05-28 NOTE — Progress Notes (Signed)
[name] ([id number]) [id number] Page 1 of 11 Visit Report for 05/27/2022 Chief Complaint Document Details Patient Name: Date of Service: [name] 05/27/2022 3:30 PM Medical Record Number: [id number] Patient Account Number: [id number] Date of Birth/Sex: Treating RN: 22-Jan-1961 (62 y.o. F) Primary Care Provider: Loralyn Freshwater Other Clinician: Referring Provider: Treating Provider/Extender: Chesley Noon in Treatment: 17 Information Obtained from: Patient Chief Complaint RLE ulcer Electronic Signature(s) Signed: 05/27/2022 4:44:28 PM By: Fredirick Maudlin MD FACS Entered By: Fredirick Maudlin on 05/27/2022 16:44:28 -------------------------------------------------------------------------------- Debridement Details Patient Name: Date of Service: [name], [name] 05/27/2022 3:30 PM Medical Record Number: [id number] Patient Account Number: [id number] Date of Birth/Sex: Treating RN: [birth year] (62 y.o. [name] Primary Care Provider: Loralyn Freshwater Other Clinician: Referring Provider: Treating Provider/Extender: Chesley Noon in Treatment: 17 Debridement Performed for Assessment: Wound #2 Right,Posterior Lower Leg Performed By: Physician Fredirick Maudlin, MD Debridement Type: Debridement Severity of Tissue Pre Debridement: Fat layer exposed Level of Consciousness (Pre-procedure): Awake and Alert Pre-procedure Verification/Time Out Yes - 16:00 Taken: Start Time: 16:02 Pain Control: Lidocaine 4% T opical Solution T Area Debrided (L x W): otal 2.5 (cm) x 2.4 (cm) = 6 (cm) Tissue and other material debrided: Viable, Non-Viable, Slough, Subcutaneous, Slough Level: Skin/Subcutaneous Tissue Debridement Description: Excisional Instrument: Curette Bleeding: Minimum Hemostasis Achieved: Pressure Procedural Pain: 0 Post Procedural Pain: 0 Response to Treatment: Procedure was  tolerated well Level of Consciousness (Post- Awake and Alert procedure): Post Debridement Measurements of Total Wound Length: (cm) 2.5 Width: (cm) 2.4 Depth: (cm) 0.6 Volume: (cm) 2.827 Character of Wound/Ulcer Post Debridement: Improved Severity of Tissue Post Debridement: Fat layer exposed [name] ([id number]) [phone].pdf Page 2 of 11 Post Procedure Diagnosis Same as Pre-procedure Notes Scribed for Dr. Celine Ahr by Baruch Gouty, RN Electronic Signature(s) Signed: 05/27/2022 5:01:55 PM By: Fredirick Maudlin MD FACS Signed: 05/27/2022 5:38:46 PM By: Baruch Gouty RN, BSN Entered By: Baruch Gouty on 05/27/2022 16:07:01 -------------------------------------------------------------------------------- HPI Details Patient Name: Date of Service: [name], [name] 05/27/2022 3:30 PM Medical Record Number: [id number] Patient Account Number: [id number] Date of Birth/Sex: Treating RN: [birth year] (62 y.o. F) Primary Care Provider: Loralyn Freshwater Other Clinician: Referring Provider: Treating Provider/Extender: Chesley Noon in Treatment: 17 History of Present Illness HPI Description: 02/16/2020 upon evaluation today patient actually appears to be doing poorly in regard to her left medial lower extremity ulcer. This is actually an area that she tells me she has had intermittent issues with over the years although has been closed for some time she typically uses compression right now she has juxta lite compression wraps. With that being said she tells me that this nonetheless open several weeks/months ago and has been given her trouble since. She does have a history of chronic venous insufficiency she is seeing specialist for this in the past she has had an ablation as well as sclerotherapy. With that being said she also has hypertension chronically which is managed by her primary care provider. In general she seems to  be worsening overall with regard to the wound and states that she finally realized that she needed to come in and have somebody look at this and not continue to try to manage this on her own. No fevers, chills, nausea, vomiting, or diarrhea. 02/23/2020 on evaluation today patient appears to be doing well with regard to her wound. This is showing some signs of improvement which is great news still were not quite  at the point where I would like to be as far as the overall appearance of the wound is concerned but I do believe this is better than last week. I do believe the Iodoflex is helping as well. 03/08/2020 upon evaluation today patient appears to be doing well with regard to her wound. She has been tolerating the dressing changes without complication. Fortunately I feel like she has made great progress with the Iodoflex but I feel like it may be the point rest to switch to something else possibly a collagen- based dressing at this time. 03/15/2020 upon evaluation today patient appears to be doing excellent in regard to her leg ulcer. She has been tolerating the dressing changes without complication. Fortunately there is no signs of active infection. Overall she is measuring a little bit smaller today which is great news. 03/22/2020 upon evaluation today patient appears to be doing well with regard to her wound. She has been tolerating the dressing changes without complication. Fortunately there is no signs of active infection at this time. 03/28/2020; patient I do not usually see however she has a wound on the left anterior lower leg secondary to chronic venous insufficiency we have been using silver collagen under compression. She arrives in clinic with a nonviable surface requiring debridement 04/12/2020 upon evaluation today patient appears to be doing well all things considered with regard to her leg ulcer. She is tolerating the dressing changes without complication there is minimal dry skin  around the edges of the wound that may be trapping and stopping some of the events of the new skin I am can work on that today. Otherwise the surface of the wound appears to be doing excellent. 04/19/2020 upon evaluation today patient appears to be doing well with regard to her leg ulcer. She has been tolerating dressing changes without complication. Fortunately there is no signs of active infection at this time. No fever chills noted overall very pleased with how things seem to be progressing. 04/26/2020 on evaluation today patient appears to be doing well with regard to her wound currently. Is showing signs of excellent improvement overall is filling in nicely and there does not appear to be any signs of infection. No fevers, chills, nausea, vomiting, or diarrhea. 05/03/2020 upon evaluation today patient appears to be doing well with regard to her wound on the leg. This overall showing signs of good improvement which is great she has some good epithelial growth and overall I think that things are moving in the correct direction. We likewise going to continue with the wound care measures as before since she seems to making such good improvement. 2/3; venous wound on the left medial leg. This is contracting. We are using Prisma and 3 layer compression. She has a stocking and waiting in the eventuality this heals. She is already using it on the right 05/17/2020 upon evaluation today patient appears to be doing well with regard to her leg ulcer. She has been tolerating the dressing changes without complication. Fortunately there is no signs of active infection which is great news and overall very pleased with where things stand today. No fevers, chills, nausea, vomiting, or diarrhea. 05/24/2020 upon evaluation today patient appears to be doing well with regard to her wound. Overall I feel like she is making excellent progress. There does not appear to be any signs of active infection which is great  news. 05/31/2020 upon evaluation today patient appears to be doing well with regard to her wound. There does not  appear to be any signs of active infection which is great news overall I am extremely pleased with where things stand today. READMISSION 01/23/2022 [name] ([id number]) [id number] Page 3 of 11 She returns to clinic today with a new wound on her right posterior calf. She says that she was cleaning out an old shed near the middle of August this year and then noticed what seemed to be a bug bite on her right posterior calf. It was itchy, red, and raised. By the end of September, an ulcer had developed. She has been applying various topical creams such as hydrocortisone and others to the site. When it was not improving, she made an appointment in the wound care center. ABI in clinic today was 0.97. On her right posterior calf, there is a circular wound with necrotic fat and black eschar present. There is no purulent drainage or malodor. The periwound skin is in good condition with just a little induration that appears to be secondary to inflammation. 01/31/2022: The wound measures a little bit larger today, but overall is quite a bit cleaner. There is some undermining from 9:00 to 3:00. She is having some periwound itching and says that her wrap slid. 02/08/2022: Despite using an Unna boot first layer at the top of her wrap, it slid again and it looks like there is been some bruising at the wound site. The wound is about the same size in terms of dimensions. There is a fair amount of slough and nonviable tissue still present. Edema control is better than last week. 02/15/2022: The wound dimensions are about the same. There is less nonviable tissue present. The periwound erythema has improved. 02/22/2022: The wound has deteriorated over the past week. It is larger and the periwound is more edematous, erythematous, and indurated. It looks as though there has  been more tissue breakdown with undermining present. She is having more pain. There is a foul odor coming from the wound. 02/26/2022: The wound looks quite a bit better today and the odor is gone. I still do not have her culture data back, but she seems to be responding well to the Augmentin. 03/06/2022: Her wound continues to improve. There is still some slough accumulation, but the periwound skin is less inflamed. Her culture returned with a polymicrobial population including Pseudomonas and so levofloxacin was also prescribed. She did not understand why a second antibiotic was being added so she has not yet initiated this. 03/14/2022: The wound is about the same, to perhaps a little bit larger. There is a fair amount of slough accumulation on the surface, as well as some hypertrophic granulation tissue. She is still taking levofloxacin, but has completed taking Augmentin. She has her [employer] compound with her today. 12/14; the patient has a significant circular wound on the posterior right calf. She is using [employer] and silver alginate under 3 layer compression. Her ABIs were within normal limits at 0.97. This may have been traumatic or an insect bite at the start I reviewed these records. 04/04/2022: Since I last saw the wound, it has contracted considerably. There is a fairly thick layer of slough on the surface, as it has not been debrided since the last time I did it. Edema control is excellent and the periwound skin is in much better condition. 04/12/2022: No significant change in the wound dimensions. It is filling with granulation tissue. Still with slough accumulation on the surface. 04/19/2022: The wound is smaller this week. There is some slough accumulation on  the surface. 04/26/2022: The wound is smaller again this week and significantly cleaner. The wound surface is a little bit drier than ideal. 05/03/2022: The wound measurements were about the same, but visually it appears smaller. There  is still some undermining at the top of the wound. Moisture balance is better this week. 05/10/2022: The wound measured smaller today. There is still a fair amount of undermining present. Slough has built up on the surface. We are still awaiting snap VAC approval. 05/17/2022: The wound is a little bit smaller today. There is less slough on the surface, but the granulation tissue still is not very robust. She has been approved for snap VAC but will have a 20% coinsurance and it is not clear what that amount would end up being for her. 05/27/2022: The surface of the wound has deteriorated and it is gray and fibrotic. No significant odor, but the drainage on her dressing was a little bit purulent. Electronic Signature(s) Signed: 05/27/2022 4:45:24 PM By: Fredirick Maudlin MD FACS Entered By: Fredirick Maudlin on 05/27/2022 16:45:24 -------------------------------------------------------------------------------- Physical Exam Details Patient Name: Date of Service: [name], [name] 05/27/2022 3:30 PM Medical Record Number: [id number] Patient Account Number: [id number] Date of Birth/Sex: Treating RN: [birth year] (62 y.o. F) Primary Care Provider: Loralyn Freshwater Other Clinician: Referring Provider: Treating Provider/Extender: Chesley Noon in Treatment: 17 Constitutional . Slightly tachycardic. . . no acute distress. Respiratory Normal work of breathing on room air. Notes 05/27/2022: The surface of the wound has deteriorated and it is gray and fibrotic. No significant odor, but the drainage on her dressing was a little bit purulent. Electronic Signature(s) Signed: 05/27/2022 4:49:27 PM By: Fredirick Maudlin MD FACS [name] ([id number]) [id number] Page 4 of 11 Entered By: Fredirick Maudlin on 05/27/2022 16:49:26 -------------------------------------------------------------------------------- Physician Orders Details Patient Name: Date of  Service: [place], SURA [name] 05/27/2022 3:30 PM Medical Record Number: [id number] Patient Account Number: [id number] Date of Birth/Sex: Treating RN: [birth year] (62 y.o. [name] Primary Care Provider: Loralyn Freshwater Other Clinician: Referring Provider: Treating Provider/Extender: Chesley Noon in Treatment: 17 Verbal / Phone Orders: No Diagnosis Coding ICD-10 Coding Code Description [id number] Non-pressure chronic ulcer of right calf with fat layer exposed E66.01 Morbid (severe) obesity due to excess calories I10 Essential (primary) hypertension R60.0 Localized edema Follow-up Appointments ppointment in 1 week. - Dr. Celine Ahr - Room 2 Return A Anesthetic (In clinic) Topical Lidocaine 4% applied to wound bed Bathing/ Shower/ Hygiene May shower with protection but do not get wound dressing(s) wet. Protect dressing(s) with water repellant cover (for example, large plastic bag) or a cast cover and may then take shower. Edema Control - Lymphedema / SCD / Other Bilateral Lower Extremities Avoid standing for long periods of time. Exercise regularly Moisturize legs daily. Compression stocking or Garment 20-30 mm/Hg pressure to: - left leg daily. Apply first thing in the morning, remove at night. Wound Treatment Wound #2 - Lower Leg Wound Laterality: Right, Posterior Cleanser: Soap and Water 1 x Per Week/30 Days Discharge Instructions: May shower and wash wound with dial antibacterial soap and water prior to dressing change. Cleanser: Wound Cleanser 1 x Per Week/30 Days Discharge Instructions: Cleanse the wound with wound cleanser prior to applying a clean dressing using gauze sponges, not tissue or cotton balls. Peri-Wound Care: Sween Lotion (Moisturizing lotion) 1 x Per Week/30 Days Discharge Instructions: Apply moisturizing lotion as directed Prim Dressing: Hydrofera Blue Classic Foam, 2x2 in 1 x Per Week/30 Days  ary Discharge Instructions: Moisten  with wound cleanser prior to applying to wound bed Secondary Dressing: Woven Gauze Sponge, Non-Sterile 4x4 in 1 x Per Week/30 Days Discharge Instructions: Apply over primary dressing as directed. Compression Wrap: ThreePress (3 layer compression wrap) 1 x Per Week/30 Days Discharge Instructions: Apply three layer compression as directed. Compression Wrap: Unnaboot w/Calamine, 4x10 (in/yd) 1 x Per Week/30 Days Discharge Instructions: Apply Unnaboot at top of leg Patient Medications llergies: Iodinated Contrast Media, Sulfa (Sulfonamide Antibiotics) A Notifications Medication Indication Start End prior to debridement 05/27/2022 lidocaine DOSE topical 4 % cream - cream topical TRUC, SEETON ([id number]) [phone] Page 5 of 11 Electronic Signature(s) Signed: 05/27/2022 5:01:55 PM By: Fredirick Maudlin MD FACS Entered By: Fredirick Maudlin on 05/27/2022 16:49:44 -------------------------------------------------------------------------------- Problem List Details Patient Name: Date of Service: [name], [name] 05/27/2022 3:30 PM Medical Record Number: [id number] Patient Account Number: [id number] Date of Birth/Sex: Treating RN: 04-Aug-1960 (62 y.o. [name] Primary Care Provider: Loralyn Freshwater Other Clinician: Referring Provider: Treating Provider/Extender: Chesley Noon in Treatment: 17 Active Problems ICD-10 Encounter Code Description Active Date MDM Diagnosis L97.212 Non-pressure chronic ulcer of right calf with fat layer exposed 01/23/2022 No Yes E66.01 Morbid (severe) obesity due to excess calories 01/23/2022 No Yes I10 Essential (primary) hypertension 01/23/2022 No Yes R60.0 Localized edema 01/23/2022 No Yes Inactive Problems Resolved Problems Electronic Signature(s) Signed: 05/27/2022 4:44:15 PM By: Fredirick Maudlin MD FACS Entered By: Fredirick Maudlin on 05/27/2022  16:44:14 -------------------------------------------------------------------------------- Progress Note Details Patient Name: Date of Service: [name], [name] 05/27/2022 3:30 PM Medical Record Number: [id number] Patient Account Number: [id number] Date of Birth/Sex: Treating RN: 1960-12-29 (62 y.o. F) Primary Care Provider: Loralyn Freshwater Other Clinician: Referring Provider: Treating Provider/Extender: Chesley Noon in Treatment: [address], Lillia Abed ([id number]) [id number] Page 6 of 11 Chief Complaint Information obtained from Patient RLE ulcer History of Present Illness (HPI) 02/16/2020 upon evaluation today patient actually appears to be doing poorly in regard to her left medial lower extremity ulcer. This is actually an area that she tells me she has had intermittent issues with over the years although has been closed for some time she typically uses compression right now she has juxta lite compression wraps. With that being said she tells me that this nonetheless open several weeks/months ago and has been given her trouble since. She does have a history of chronic venous insufficiency she is seeing specialist for this in the past she has had an ablation as well as sclerotherapy. With that being said she also has hypertension chronically which is managed by her primary care provider. In general she seems to be worsening overall with regard to the wound and states that she finally realized that she needed to come in and have somebody look at this and not continue to try to manage this on her own. No fevers, chills, nausea, vomiting, or diarrhea. 02/23/2020 on evaluation today patient appears to be doing well with regard to her wound. This is showing some signs of improvement which is great news still were not quite at the point where I would like to be as far as the overall appearance of the wound is concerned but I do  believe this is better than last week. I do believe the Iodoflex is helping as well. 03/08/2020 upon evaluation today patient appears to be doing well with regard to her wound. She has been tolerating the dressing changes without complication. Fortunately I feel like  she has made great progress with the Iodoflex but I feel like it may be the point rest to switch to something else possibly a collagen- based dressing at this time. 03/15/2020 upon evaluation today patient appears to be doing excellent in regard to her leg ulcer. She has been tolerating the dressing changes without complication. Fortunately there is no signs of active infection. Overall she is measuring a little bit smaller today which is great news. 03/22/2020 upon evaluation today patient appears to be doing well with regard to her wound. She has been tolerating the dressing changes without complication. Fortunately there is no signs of active infection at this time. 03/28/2020; patient I do not usually see however she has a wound on the left anterior lower leg secondary to chronic venous insufficiency we have been using silver collagen under compression. She arrives in clinic with a nonviable surface requiring debridement 04/12/2020 upon evaluation today patient appears to be doing well all things considered with regard to her leg ulcer. She is tolerating the dressing changes without complication there is minimal dry skin around the edges of the wound that may be trapping and stopping some of the events of the new skin I am can work on that today. Otherwise the surface of the wound appears to be doing excellent. 04/19/2020 upon evaluation today patient appears to be doing well with regard to her leg ulcer. She has been tolerating dressing changes without complication. Fortunately there is no signs of active infection at this time. No fever chills noted overall very pleased with how things seem to be progressing. 04/26/2020 on evaluation  today patient appears to be doing well with regard to her wound currently. Is showing signs of excellent improvement overall is filling in nicely and there does not appear to be any signs of infection. No fevers, chills, nausea, vomiting, or diarrhea. 05/03/2020 upon evaluation today patient appears to be doing well with regard to her wound on the leg. This overall showing signs of good improvement which is great she has some good epithelial growth and overall I think that things are moving in the correct direction. We likewise going to continue with the wound care measures as before since she seems to making such good improvement. 2/3; venous wound on the left medial leg. This is contracting. We are using Prisma and 3 layer compression. She has a stocking and waiting in the eventuality this heals. She is already using it on the right 05/17/2020 upon evaluation today patient appears to be doing well with regard to her leg ulcer. She has been tolerating the dressing changes without complication. Fortunately there is no signs of active infection which is great news and overall very pleased with where things stand today. No fevers, chills, nausea, vomiting, or diarrhea. 05/24/2020 upon evaluation today patient appears to be doing well with regard to her wound. Overall I feel like she is making excellent progress. There does not appear to be any signs of active infection which is great news. 05/31/2020 upon evaluation today patient appears to be doing well with regard to her wound. There does not appear to be any signs of active infection which is great news overall I am extremely pleased with where things stand today. READMISSION 01/23/2022 She returns to clinic today with a new wound on her right posterior calf. She says that she was cleaning out an old shed near the middle of August this year and then noticed what seemed to be a bug bite on her right  posterior calf. It was itchy, red, and raised. By the  end of September, an ulcer had developed. She has been applying various topical creams such as hydrocortisone and others to the site. When it was not improving, she made an appointment in the wound care center. ABI in clinic today was 0.97. On her right posterior calf, there is a circular wound with necrotic fat and black eschar present. There is no purulent drainage or malodor. The periwound skin is in good condition with just a little induration that appears to be secondary to inflammation. 01/31/2022: The wound measures a little bit larger today, but overall is quite a bit cleaner. There is some undermining from 9:00 to 3:00. She is having some periwound itching and says that her wrap slid. 02/08/2022: Despite using an Unna boot first layer at the top of her wrap, it slid again and it looks like there is been some bruising at the wound site. The wound is about the same size in terms of dimensions. There is a fair amount of slough and nonviable tissue still present. Edema control is better than last week. 02/15/2022: The wound dimensions are about the same. There is less nonviable tissue present. The periwound erythema has improved. 02/22/2022: The wound has deteriorated over the past week. It is larger and the periwound is more edematous, erythematous, and indurated. It looks as though there has been more tissue breakdown with undermining present. She is having more pain. There is a foul odor coming from the wound. 02/26/2022: The wound looks quite a bit better today and the odor is gone. I still do not have her culture data back, but she seems to be responding well to the Augmentin. 03/06/2022: Her wound continues to improve. There is still some slough accumulation, but the periwound skin is less inflamed. Her culture returned with a polymicrobial population including Pseudomonas and so levofloxacin was also prescribed. She did not understand why a second antibiotic was being added so she has not  yet initiated this. 03/14/2022: The wound is about the same, to perhaps a little bit larger. There is a fair amount of slough accumulation on the surface, as well as some hypertrophic granulation tissue. She is still taking levofloxacin, but has completed taking Augmentin. She has her [employer] compound with her today. 12/14; the patient has a significant circular wound on the posterior right calf. She is using Keystone and silver alginate under 3 layer compression. Her ABIs were within normal limits at 0.97. This may have been traumatic or an insect bite at the start I reviewed these records. 04/04/2022: Since I last saw the wound, it has contracted considerably. There is a fairly thick layer of slough on the surface, as it has not been debrided since the last time I did it. Edema control is excellent and the periwound skin is in much better condition. 04/12/2022: No significant change in the wound dimensions. It is filling with granulation tissue. Still with slough accumulation on the surface. RUTHANNE, WOLFLEY ([id number]) [id number] Page 7 of 11 04/19/2022: The wound is smaller this week. There is some slough accumulation on the surface. 04/26/2022: The wound is smaller again this week and significantly cleaner. The wound surface is a little bit drier than ideal. 05/03/2022: The wound measurements were about the same, but visually it appears smaller. There is still some undermining at the top of the wound. Moisture balance is better this week. 05/10/2022: The wound measured smaller today. There is still a fair amount of undermining  present. Slough has built up on the surface. We are still awaiting snap VAC approval. 05/17/2022: The wound is a little bit smaller today. There is less slough on the surface, but the granulation tissue still is not very robust. She has been approved for snap VAC but will have a 20% coinsurance and it is not clear what that amount would end up being  for her. 05/27/2022: The surface of the wound has deteriorated and it is gray and fibrotic. No significant odor, but the drainage on her dressing was a little bit purulent. Patient History Information obtained from Patient. Family History Cancer - Mother, Diabetes - Father, Hypertension - Mother, Kidney Disease - Mother, Lung Disease - Father, Thyroid Problems - Paternal Grandparents, No family history of Heart Disease, Seizures, Stroke, Tuberculosis. Social History Never smoker, Marital Status - Married, Alcohol Use - Never, Drug Use - No History, Caffeine Use - Daily - coffee. Medical History Eyes Denies history of Cataracts, Glaucoma, Optic Neuritis Ear/Nose/Mouth/Throat Denies history of Chronic sinus problems/congestion, Middle ear problems Hematologic/Lymphatic Patient has history of Anemia - iron Denies history of Hemophilia, Human Immunodeficiency Virus, Lymphedema, Sickle Cell Disease Respiratory Patient has history of Sleep Apnea - CPAP Denies history of Aspiration, Asthma, Chronic Obstructive Pulmonary Disease (COPD), Pneumothorax, Tuberculosis Cardiovascular Patient has history of Hypertension, Peripheral Venous Disease Denies history of Angina, Arrhythmia, Congestive Heart Failure, Coronary Artery Disease, Deep Vein Thrombosis, Hypotension, Myocardial Infarction, Peripheral Arterial Disease, Phlebitis, Vasculitis Gastrointestinal Denies history of Cirrhosis , Colitis, Crohnoos, Hepatitis A, Hepatitis B, Hepatitis C Endocrine Denies history of Type I Diabetes, Type II Diabetes Genitourinary Denies history of End Stage Renal Disease Immunological Denies history of Lupus Erythematosus, Raynaudoos, Scleroderma Integumentary (Skin) Denies history of History of Burn Musculoskeletal Denies history of Gout, Rheumatoid Arthritis, Osteoarthritis, Osteomyelitis Neurologic Denies history of Dementia, Neuropathy, Quadriplegia, Paraplegia, Seizure Disorder Oncologic Denies  history of Received Chemotherapy, Received Radiation Psychiatric Denies history of Anorexia/bulimia, Confinement Anxiety Hospitalization/Surgery History - cholecystectomy 1980s. - nephrolithasis 1980s. - plate and rod in right elbow surgery 2010. 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 no acute distress. Vitals Time Taken: 3:43 AM, Height: 61 in, Weight: 277 lbs, BMI: 52.3, Temperature: 98.3 F, Pulse: 102 bpm, Respiratory Rate: 20 breaths/min, Blood Pressure: 105/55 mmHg. Respiratory Normal work of breathing on room air. KATRECE, SICKLER ([id number]) [id number] Page 8 of 11 General Notes: 05/27/2022: The surface of the wound has deteriorated and it is gray and fibrotic. No significant odor, but the drainage on her dressing was a little bit purulent. Integumentary (Hair, Skin) Wound #2 status is Open. Original cause of wound was Insect Bite. The date acquired was: 11/21/2021. The wound has been in treatment 17 weeks. The wound is located on the Right,Posterior Lower Leg. The wound measures 2.5cm length x 2.4cm width x 0.6cm depth; 4.712cm^2 area and 2.827cm^3 volume. There is Fat Layer (Subcutaneous Tissue) exposed. There is no tunneling or undermining noted. There is a medium amount of purulent drainage noted. The wound margin is distinct with the outline attached to the wound base. There is small (1-33%) red, friable granulation within the wound bed. There is a large (67-100%) amount of necrotic tissue within the wound bed including Adherent Slough. The periwound skin appearance had no abnormalities noted for moisture. The periwound skin appearance exhibited: Hemosiderin Staining. The periwound skin appearance did not exhibit: Excoriation, Scarring, Atrophie Blanche, Ecchymosis, Rubor, Erythema. Periwound temperature was noted  as No  Abnormality. Assessment Active Problems ICD-10 Non-pressure chronic ulcer of right calf with fat layer exposed Morbid (severe) obesity due to excess calories Essential (primary) hypertension Localized edema Procedures Wound #2 Pre-procedure diagnosis of Wound #2 is a Venous Leg Ulcer located on the Right,Posterior Lower Leg .Severity of Tissue Pre Debridement is: Fat layer exposed. There was a Excisional Skin/Subcutaneous Tissue Debridement with a total area of 6 sq cm performed by Fredirick Maudlin, MD. With the following instrument(s): Curette to remove Viable and Non-Viable tissue/material. Material removed includes Subcutaneous Tissue and Slough and after achieving pain control using Lidocaine 4% T opical Solution. No specimens were taken. A time out was conducted at 16:00, prior to the start of the procedure. A Minimum amount of bleeding was controlled with Pressure. The procedure was tolerated well with a pain level of 0 throughout and a pain level of 0 following the procedure. Post Debridement Measurements: 2.5cm length x 2.4cm width x 0.6cm depth; 2.827cm^3 volume. Character of Wound/Ulcer Post Debridement is improved. Severity of Tissue Post Debridement is: Fat layer exposed. Post procedure Diagnosis Wound #2: Same as Pre-Procedure General Notes: Scribed for Dr. Celine Ahr by Baruch Gouty, RN. Pre-procedure diagnosis of Wound #2 is a Venous Leg Ulcer located on the Right,Posterior Lower Leg . There was a Three Layer Compression Therapy Procedure by Baruch Gouty, RN. Post procedure Diagnosis Wound #2: Same as Pre-Procedure Plan Follow-up Appointments: Return Appointment in 1 week. - Dr. Celine Ahr - Room 2 Anesthetic: (In clinic) Topical Lidocaine 4% applied to wound bed Bathing/ Shower/ Hygiene: May shower with protection but do not get wound dressing(s) wet. Protect dressing(s) with water repellant cover (for example, large plastic bag) or a cast cover and may then take  shower. Edema Control - Lymphedema / SCD / Other: Avoid standing for long periods of time. Exercise regularly Moisturize legs daily. Compression stocking or Garment 20-30 mm/Hg pressure to: - left leg daily. Apply first thing in the morning, remove at night. The following medication(s) was prescribed: lidocaine topical 4 % cream cream topical for prior to debridement was prescribed at facility WOUND #2: - Lower Leg Wound Laterality: Right, Posterior Cleanser: Soap and Water 1 x Per Week/30 Days Discharge Instructions: May shower and wash wound with dial antibacterial soap and water prior to dressing change. Cleanser: Wound Cleanser 1 x Per Week/30 Days Discharge Instructions: Cleanse the wound with wound cleanser prior to applying a clean dressing using gauze sponges, not tissue or cotton balls. Peri-Wound Care: Sween Lotion (Moisturizing lotion) 1 x Per Week/30 Days Discharge Instructions: Apply moisturizing lotion as directed Prim Dressing: Hydrofera Blue Classic Foam, 2x2 in 1 x Per Week/30 Days ary Discharge Instructions: Moisten with wound cleanser prior to applying to wound bed Secondary Dressing: Woven Gauze Sponge, Non-Sterile 4x4 in 1 x Per Week/30 Days Discharge Instructions: Apply over primary dressing as directed. Com pression Wrap: ThreePress (3 layer compression wrap) 1 x Per Week/30 Days Discharge Instructions: Apply three layer compression as directed. Com pression Wrap: Unnaboot w/Calamine, 4x10 (in/yd) 1 x Per Week/30 Days Discharge Instructions: Apply Unnaboot at top of leg JANIQUE, HROMADKA ([id number]) [id number] Page 9 of 11 05/27/2022: The surface of the wound has deteriorated and it is gray and fibrotic. No significant odor, but the drainage on her dressing was a little bit purulent. I used a curette to debride slough and nonviable subcutaneous tissue from her wound. I am going to forego her Keystone topical antibiotic compound this week  and instead use Hydrofera Blue classic  moistened with wound cleanser to see if we can get this to clean up a little bit better. Continue 3 layer compression. Follow-up in 1 week. Electronic Signature(s) Signed: 05/27/2022 4:50:30 PM By: Fredirick Maudlin MD FACS Entered By: Fredirick Maudlin on 05/27/2022 16:50:29 -------------------------------------------------------------------------------- HxROS Details Patient Name: Date of Service: [name], [name] 05/27/2022 3:30 PM Medical Record Number: [id number] Patient Account Number: [id number] Date of Birth/Sex: Treating RN: 04-07-1961 (62 y.o. F) Primary Care Provider: Loralyn Freshwater Other Clinician: Referring Provider: Treating Provider/Extender: Chesley Noon in Treatment: 17 Information Obtained From Patient Eyes Medical History: Negative for: Cataracts; Glaucoma; Optic Neuritis Ear/Nose/Mouth/Throat Medical History: Negative for: Chronic sinus problems/congestion; Middle ear problems Hematologic/Lymphatic Medical History: Positive for: Anemia - iron Negative for: Hemophilia; Human Immunodeficiency Virus; Lymphedema; Sickle Cell Disease Respiratory Medical History: Positive for: Sleep Apnea - CPAP Negative for: Aspiration; Asthma; Chronic Obstructive Pulmonary Disease (COPD); Pneumothorax; Tuberculosis Cardiovascular Medical History: Positive for: Hypertension; Peripheral Venous Disease Negative for: Angina; Arrhythmia; Congestive Heart Failure; Coronary Artery Disease; Deep Vein Thrombosis; Hypotension; Myocardial Infarction; Peripheral Arterial Disease; Phlebitis; Vasculitis Gastrointestinal Medical History: Negative for: Cirrhosis ; Colitis; Crohns; Hepatitis A; Hepatitis B; Hepatitis C Endocrine Medical History: Negative for: Type I Diabetes; Type II Diabetes Genitourinary Medical History: Negative for: End Stage Renal Disease Past Medical History Notes: years ago kidney stones JANNELY, KIRIN ([id number]) [id number] Page 10 of 11 Immunological Medical History: Negative for: Lupus Erythematosus; Raynauds; Scleroderma Integumentary (Skin) Medical History: Negative for: History of Burn Musculoskeletal Medical History: Negative for: Gout; Rheumatoid Arthritis; Osteoarthritis; Osteomyelitis Neurologic Medical History: Negative for: Dementia; Neuropathy; Quadriplegia; Paraplegia; Seizure Disorder Oncologic Medical History: Negative for: Received Chemotherapy; Received Radiation Past Medical History Notes: skin Ca removed from back years ago Psychiatric Medical History: Negative for: Anorexia/bulimia; Confinement Anxiety Immunizations Pneumococcal Vaccine: Received Pneumococcal Vaccination: No Implantable Devices None Hospitalization / Surgery History Type of Hospitalization/Surgery cholecystectomy 1980s nephrolithasis 1980s plate and rod in right elbow surgery 2010 Family and Social History Cancer: Yes - Mother; Diabetes: Yes - Father; Heart Disease: No; Hypertension: Yes - Mother; Kidney Disease: Yes - Mother; Lung Disease: Yes - Father; Seizures: No; Stroke: No; Thyroid Problems: Yes - Paternal Grandparents; Tuberculosis: No; Never smoker; Marital Status - Married; Alcohol Use: Never; Drug Use: No History; Caffeine Use: Daily - coffee; Financial Concerns: No; Food, Clothing or Shelter Needs: No; Support System Lacking: No; Transportation Concerns: No Electronic Signature(s) Signed: 05/27/2022 5:01:55 PM By: Fredirick Maudlin MD FACS Entered By: Fredirick Maudlin on 05/27/2022 16:46:19 -------------------------------------------------------------------------------- SuperBill Details Patient Name: Date of Service: [name], [name] 05/27/2022 Medical Record Number: [id number] Patient Account Number: [id number] Date of Birth/Sex: Treating RN: [birth year] (62 y.o. F) Primary Care Provider: Loralyn Freshwater Other Clinician: Referring  Provider: Treating Provider/Extender: Chesley Noon in Treatment: [address] IVONA, COOKSEY ([id number]) [id number] Page 11 of 11 ICD-10 Codes Code Description [id number] Non-pressure chronic ulcer of right calf with fat layer exposed E66.01 Morbid (severe) obesity due to excess calories I10 Essential (primary) hypertension R60.0 Localized edema Facility Procedures : CPT4 Code: [id number] Description: [id number] - DEB SUBQ TISSUE 20 SQ CM/< ICD-10 Diagnosis Description L97.212 Non-pressure chronic ulcer of right calf with fat layer exposed Modifier: Quantity: 1 Physician Procedures : CPT4 Code Description Modifier [id number] - WC PHYS LEVEL 3 - EST PT 25 ICD-10 Diagnosis Description L97.212 Non-pressure chronic ulcer of right calf with fat layer exposed R60.0 Localized edema E66.01 Morbid (severe) obesity due to excess calories  I10 Essential (  primary) hypertension Quantity: 1 : [id number] - WC PHYS SUBQ TISS 20 SQ CM ICD-10 Diagnosis Description L97.212 Non-pressure chronic ulcer of right calf with fat layer exposed Quantity: 1 Electronic Signature(s) Signed: 05/27/2022 4:50:47 PM By: Fredirick Maudlin MD FACS Entered By: Fredirick Maudlin on 05/27/2022 16:50:46

## 2022-05-31 ENCOUNTER — Encounter (HOSPITAL_BASED_OUTPATIENT_CLINIC_OR_DEPARTMENT_OTHER): Payer: BC Managed Care – PPO | Admitting: General Surgery

## 2022-05-31 DIAGNOSIS — L97212 Non-pressure chronic ulcer of right calf with fat layer exposed: Secondary | ICD-10-CM | POA: Diagnosis not present

## 2022-06-01 NOTE — Progress Notes (Signed)
ELLESSE, SOPER ([id number]) [id number] Page 1 of 10 Visit Report for 05/31/2022 Chief Complaint Document Details Patient Name: Date of Service: [name] 05/31/2022 2:45 PM Medical Record Number: [id number] Patient Account Number: [id number] Date of Birth/Sex: Treating RN: 1960-07-09 (62 y.o. F) Primary Care Provider: Loralyn Freshwater Other Clinician: Referring Provider: Treating Provider/Extender: Chesley Noon in Treatment: 18 Information Obtained from: Patient Chief Complaint RLE ulcer Electronic Signature(s) Signed: 05/31/2022 3:20:24 PM By: Fredirick Maudlin MD FACS Entered By: Fredirick Maudlin on 05/31/2022 15:20:24 -------------------------------------------------------------------------------- Debridement Details Patient Name: Date of Service: [name], [name] 05/31/2022 2:45 PM Medical Record Number: [id number] Patient Account Number: [id number] Date of Birth/Sex: Treating RN: 1960-10-25 (62 y.o. [name], [name] Primary Care Provider: Loralyn Freshwater Other Clinician: Referring Provider: Treating Provider/Extender: Chesley Noon in Treatment: 18 Debridement Performed for Assessment: Wound #2 Right,Posterior Lower Leg Performed By: Physician Fredirick Maudlin, MD Debridement Type: Debridement Severity of Tissue Pre Debridement: Fat layer exposed Level of Consciousness (Pre-procedure): Awake and Alert Pre-procedure Verification/Time Out Yes - 15:05 Taken: Start Time: 15:06 Pain Control: Lidocaine 5% topical ointment T Area Debrided (L x W): otal 2.2 (cm) x 1 (cm) = 2.2 (cm) Tissue and other material debrided: Non-Viable, Eschar, Slough, Slough Level: Non-Viable Tissue Debridement Description: Selective/Open Wound Instrument: Curette Bleeding: Minimum Hemostasis Achieved: Pressure Procedural Pain: 0 Post Procedural Pain: 0 Response to Treatment: Procedure was tolerated  well Level of Consciousness (Post- Awake and Alert procedure): Post Debridement Measurements of Total Wound Length: (cm) 2.2 Width: (cm) 1 Depth: (cm) 0.1 Volume: (cm) 0.173 Character of Wound/Ulcer Post Debridement: Requires Further Debridement Severity of Tissue Post Debridement: Fat layer exposed Post Procedure Diagnosis Same as Pre-procedure Notes Scribed for Dr. Celine Ahr by [name], RN Electronic Signature(s) Signed: 05/31/2022 3:28:32 PM By: Fredirick Maudlin MD FACS [name] ([id number]) [id number] Page 2 of 10 Signed: 05/31/2022 3:38:57 PM By: [name] RN Entered By: [name] on 05/31/2022 15:08:06 -------------------------------------------------------------------------------- HPI Details Patient Name: Date of Service: [address] [name] 05/31/2022 2:45 PM Medical Record Number: [id number] Patient Account Number: [id number] Date of Birth/Sex: Treating RN: 1961-03-08 (62 y.o. F) Primary Care Provider: Loralyn Freshwater Other Clinician: Referring Provider: Treating Provider/Extender: Chesley Noon in Treatment: 18 History of Present Illness HPI Description: 02/16/2020 upon evaluation today patient actually appears to be doing poorly in regard to her left medial lower extremity ulcer. This is actually an area that [name] tells me [name] has had intermittent issues with over the years although has been closed for some time [name] typically uses compression right now [name] has juxta lite compression wraps. With that being said [name] tells me that this nonetheless open several weeks/months ago and has been given her trouble since. [name] does have a history of chronic venous insufficiency [name] is seeing specialist for this in the past [name] has had an ablation as well as sclerotherapy. With that being said [name] also has hypertension chronically which is managed by her primary care provider. In general [name] seems to  be worsening overall with regard to the wound and states that [name] finally realized that [name] needed to come in and have somebody look at this and not continue to try to manage this on her own. No fevers, chills, nausea, vomiting, or diarrhea. 02/23/2020 on evaluation today patient appears to be doing well with regard to her wound. This is showing some signs of improvement which is great news still were not quite  at the point where I would like to be as far as the overall appearance of the wound is concerned but I do believe this is better than last week. I do believe the Iodoflex is helping as well. 03/08/2020 upon evaluation today patient appears to be doing well with regard to her wound. [name] has been tolerating the dressing changes without complication. Fortunately I feel like [name] has made great progress with the Iodoflex but I feel like it may be the point rest to switch to something else possibly a collagen- based dressing at this time. 03/15/2020 upon evaluation today patient appears to be doing excellent in regard to her leg ulcer. [name] has been tolerating the dressing changes without complication. Fortunately there is no signs of active infection. Overall [name] is measuring a little bit smaller today which is great news. 03/22/2020 upon evaluation today patient appears to be doing well with regard to her wound. [name] has been tolerating the dressing changes without complication. Fortunately there is no signs of active infection at this time. 03/28/2020; patient I do not usually see however [name] has a wound on the left anterior lower leg secondary to chronic venous insufficiency we have been using silver collagen under compression. [name] arrives in clinic with a nonviable surface requiring debridement 04/12/2020 upon evaluation today patient appears to be doing well all things considered with regard to her leg ulcer. [name] is tolerating the dressing changes without complication there is minimal dry skin  around the edges of the wound that may be trapping and stopping some of the events of the new skin I am can work on that today. Otherwise the surface of the wound appears to be doing excellent. 04/19/2020 upon evaluation today patient appears to be doing well with regard to her leg ulcer. [name] has been tolerating dressing changes without complication. Fortunately there is no signs of active infection at this time. No fever chills noted overall very pleased with how things seem to be progressing. 04/26/2020 on evaluation today patient appears to be doing well with regard to her wound currently. Is showing signs of excellent improvement overall is filling in nicely and there does not appear to be any signs of infection. No fevers, chills, nausea, vomiting, or diarrhea. 05/03/2020 upon evaluation today patient appears to be doing well with regard to her wound on the leg. This overall showing signs of good improvement which is great [name] has some good epithelial growth and overall I think that things are moving in the correct direction. We likewise going to continue with the wound care measures as before since [name] seems to making such good improvement. 2/3; venous wound on the left medial leg. This is contracting. We are using Prisma and 3 layer compression. [name] has a stocking and waiting in the eventuality this heals. [name] is already using it on the right 05/17/2020 upon evaluation today patient appears to be doing well with regard to her leg ulcer. [name] has been tolerating the dressing changes without complication. Fortunately there is no signs of active infection which is great news and overall very pleased with where things stand today. No fevers, chills, nausea, vomiting, or diarrhea. 05/24/2020 upon evaluation today patient appears to be doing well with regard to her wound. Overall I feel like [name] is making excellent progress. There does not appear to be any signs of active infection which is great  news. 05/31/2020 upon evaluation today patient appears to be doing well with regard to her wound. There does not  appear to be any signs of active infection which is great news overall I am extremely pleased with where things stand today. READMISSION 01/23/2022 [name] returns to clinic today with a new wound on her right posterior calf. [name] says that [name] was cleaning out an old shed near the middle of August this year and then noticed what seemed to be a bug bite on her right posterior calf. It was itchy, red, and raised. By the end of September, an ulcer had developed. [name] has been applying various topical creams such as hydrocortisone and others to the site. When it was not improving, [name] made an appointment in the wound care center. ABI in clinic today was 0.97. On her right posterior calf, there is a circular wound with necrotic fat and black eschar present. There is no purulent drainage or malodor. The periwound skin is in good condition with just a little induration that appears to be secondary to inflammation. 01/31/2022: The wound measures a little bit larger today, but overall is quite a bit cleaner. There is some undermining from 9:00 to 3:00. [name] is having some periwound itching and says that her wrap slid. 02/08/2022: Despite using an Unna boot first layer at the top of her wrap, it slid again and it looks like there is been some bruising at the wound site. The wound is about the same size in terms of dimensions. There is a fair amount of slough and nonviable tissue still present. Edema control is better than last week. 02/15/2022: The wound dimensions are about the same. There is less nonviable tissue present. The periwound erythema has improved. 02/22/2022: The wound has deteriorated over the past week. It is larger and the periwound is more edematous, erythematous, and indurated. It looks as though there has been more tissue breakdown with undermining present. [name] is having more pain. There  is a foul odor coming from the wound. ZAMYAH, LELLO ([id number]) [id number] Page 3 of 10 02/26/2022: The wound looks quite a bit better today and the odor is gone. I still do not have her culture data back, but [name] seems to be responding well to the Augmentin. 03/06/2022: Her wound continues to improve. There is still some slough accumulation, but the periwound skin is less inflamed. Her culture returned with a polymicrobial population including Pseudomonas and so levofloxacin was also prescribed. [name] did not understand why a second antibiotic was being added so [name] has not yet initiated this. 03/14/2022: The wound is about the same, to perhaps a little bit larger. There is a fair amount of slough accumulation on the surface, as well as some hypertrophic granulation tissue. [name] is still taking levofloxacin, but has completed taking Augmentin. [name] has her [employer] compound with her today. 12/14; the patient has a significant circular wound on the posterior right calf. [name] is using [employer] and silver alginate under 3 layer compression. Her ABIs were within normal limits at 0.97. This may have been traumatic or an insect bite at the start I reviewed these records. 04/04/2022: Since I last saw the wound, it has contracted considerably. There is a fairly thick layer of slough on the surface, as it has not been debrided since the last time I did it. Edema control is excellent and the periwound skin is in much better condition. 04/12/2022: No significant change in the wound dimensions. It is filling with granulation tissue. Still with slough accumulation on the surface. 04/19/2022: The wound is smaller this week. There is some slough accumulation on  the surface. 04/26/2022: The wound is smaller again this week and significantly cleaner. The wound surface is a little bit drier than ideal. 05/03/2022: The wound measurements were about the same, but visually it appears smaller. There  is still some undermining at the top of the wound. Moisture balance is better this week. 05/10/2022: The wound measured smaller today. There is still a fair amount of undermining present. Slough has built up on the surface. We are still awaiting snap VAC approval. 05/17/2022: The wound is a little bit smaller today. There is less slough on the surface, but the granulation tissue still is not very robust. [name] has been approved for snap VAC but will have a 20% coinsurance and it is not clear what that amount would end up being for her. 05/27/2022: The surface of the wound has deteriorated and it is gray and fibrotic. No significant odor, but the drainage on her dressing was a little bit purulent. 05/31/2022: The wound looks quite a bit better today. It is still a little fibrotic but no longer has purulent-looking drainage. The color is better. [name] spoke with her insurance company and is interested in trying the snap VAC, now that [name] is aware of the cost to her. Electronic Signature(s) Signed: 05/31/2022 3:21:34 PM By: Fredirick Maudlin MD FACS Entered By: Fredirick Maudlin on 05/31/2022 15:21:34 -------------------------------------------------------------------------------- Physical Exam Details Patient Name: Date of Service: [name], [name] 05/31/2022 2:45 PM Medical Record Number: [id number] Patient Account Number: [id number] Date of Birth/Sex: Treating RN: Apr 15, 1960 (63 y.o. F) Primary Care Provider: Loralyn Freshwater Other Clinician: Referring Provider: Treating Provider/Extender: Chesley Noon in Treatment: 18 Constitutional no acute distress. Respiratory Normal work of breathing on room air. Notes 05/31/2022: The wound looks quite a bit better today. It is still a little fibrotic but no longer has purulent-looking drainage. The color is better. Electronic Signature(s) Signed: 05/31/2022 3:23:41 PM By: Fredirick Maudlin MD FACS Entered By: Fredirick Maudlin on  05/31/2022 15:23:41 -------------------------------------------------------------------------------- Physician Orders Details Patient Name: Date of Service: [address] [name] 05/31/2022 2:45 PM Medical Record Number: [id number] Patient Account Number: [id number] Date of Birth/Sex: Treating RN: 1960-06-11 (62 y.o. [name] Primary Care Provider: Loralyn Freshwater Other Clinician: Referring Provider: Treating Provider/Extender: Chesley Noon in Treatment: [phone] Verbal / Phone Orders: No Diagnosis Coding CORALEIGH, TOYAMA ([id number]) [id number] Page 4 of 10 ICD-10 Coding Code Description [phone] Non-pressure chronic ulcer of right calf with fat layer exposed E66.01 Morbid (severe) obesity due to excess calories I10 Essential (primary) hypertension R60.0 Localized edema Follow-up Appointments ppointment in 1 week. - Dr. Celine Ahr - Room 2 Return A Anesthetic (In clinic) Topical Lidocaine 4% applied to wound bed Bathing/ Shower/ Hygiene May shower with protection but do not get wound dressing(s) wet. Protect dressing(s) with water repellant cover (for example, large plastic bag) or a cast cover and may then take shower. Edema Control - Lymphedema / SCD / Other Bilateral Lower Extremities Avoid standing for long periods of time. Exercise regularly Moisturize legs daily. Compression stocking or Garment 20-30 mm/Hg pressure to: - left leg daily. Apply first thing in the morning, remove at night. Wound Treatment Wound #2 - Lower Leg Wound Laterality: Right, Posterior Cleanser: Soap and Water 1 x Per Week/30 Days Discharge Instructions: May shower and wash wound with dial antibacterial soap and water prior to dressing change. Cleanser: Wound Cleanser 1 x Per Week/30 Days Discharge Instructions: Cleanse the wound with wound cleanser prior to applying a  clean dressing using gauze sponges, not tissue or cotton balls. Peri-Wound  Care: Sween Lotion (Moisturizing lotion) 1 x Per Week/30 Days Discharge Instructions: Apply moisturizing lotion as directed Prim Dressing: SNAP VAC ary 1 x Per Week/30 Days Secondary Dressing: Woven Gauze Sponge, Non-Sterile 4x4 in 1 x Per Week/30 Days Discharge Instructions: Apply over primary dressing as directed. Compression Wrap: ThreePress (3 layer compression wrap) 1 x Per Week/30 Days Discharge Instructions: Apply three layer compression as directed. Compression Wrap: Unnaboot w/Calamine, 4x10 (in/yd) 1 x Per Week/30 Days Discharge Instructions: Apply Unnaboot at top of leg Electronic Signature(s) Signed: 05/31/2022 3:28:32 PM By: Fredirick Maudlin MD FACS Previous Signature: 05/31/2022 3:03:07 PM Version By: [name] RN Entered By: Fredirick Maudlin on 05/31/2022 15:24:22 -------------------------------------------------------------------------------- Problem List Details Patient Name: Date of Service: [name], [name] 05/31/2022 2:45 PM Medical Record Number: [id number] Patient Account Number: [id number] Date of Birth/Sex: Treating RN: 06-04-1960 (62 y.o. F) Primary Care Provider: Loralyn Freshwater Other Clinician: Referring Provider: Treating Provider/Extender: Chesley Noon in Treatment: 18 Active Problems ICD-10 Encounter Code Description Active Date MDM Diagnosis L97.212 Non-pressure chronic ulcer of right calf with fat layer exposed 01/23/2022 No Yes ZHANEE, RENNARD ([id number]) [phone].pdf Page 5 of 10 E66.01 Morbid (severe) obesity due to excess calories 01/23/2022 No Yes I10 Essential (primary) hypertension 01/23/2022 No Yes R60.0 Localized edema 01/23/2022 No Yes Inactive Problems Resolved Problems Electronic Signature(s) Signed: 05/31/2022 3:20:12 PM By: Fredirick Maudlin MD FACS Entered By: Fredirick Maudlin on 05/31/2022  15:20:11 -------------------------------------------------------------------------------- Progress Note Details Patient Name: Date of Service: [name], [name] 05/31/2022 2:45 PM Medical Record Number: [id number] Patient Account Number: [id number] Date of Birth/Sex: Treating RN: 1961/03/05 (62 y.o. F) Primary Care Provider: Loralyn Freshwater Other Clinician: Referring Provider: Treating Provider/Extender: Chesley Noon in Treatment: 18 Subjective Chief Complaint Information obtained from Patient RLE ulcer History of Present Illness (HPI) 02/16/2020 upon evaluation today patient actually appears to be doing poorly in regard to her left medial lower extremity ulcer. This is actually an area that [name] tells me [name] has had intermittent issues with over the years although has been closed for some time [name] typically uses compression right now [name] has juxta lite compression wraps. With that being said [name] tells me that this nonetheless open several weeks/months ago and has been given her trouble since. [name] does have a history of chronic venous insufficiency [name] is seeing specialist for this in the past [name] has had an ablation as well as sclerotherapy. With that being said [name] also has hypertension chronically which is managed by her primary care provider. In general [name] seems to be worsening overall with regard to the wound and states that [name] finally realized that [name] needed to come in and have somebody look at this and not continue to try to manage this on her own. No fevers, chills, nausea, vomiting, or diarrhea. 02/23/2020 on evaluation today patient appears to be doing well with regard to her wound. This is showing some signs of improvement which is great news still were not quite at the point where I would like to be as far as the overall appearance of the wound is concerned but I do believe this is better than last week. I do believe the Iodoflex is helping as  well. 03/08/2020 upon evaluation today patient appears to be doing well with regard to her wound. [name] has been tolerating the dressing changes without complication. Fortunately I feel like [name] has made great  progress with the Iodoflex but I feel like it may be the point rest to switch to something else possibly a collagen- based dressing at this time. 03/15/2020 upon evaluation today patient appears to be doing excellent in regard to her leg ulcer. [name] has been tolerating the dressing changes without complication. Fortunately there is no signs of active infection. Overall [name] is measuring a little bit smaller today which is great news. 03/22/2020 upon evaluation today patient appears to be doing well with regard to her wound. [name] has been tolerating the dressing changes without complication. Fortunately there is no signs of active infection at this time. 03/28/2020; patient I do not usually see however [name] has a wound on the left anterior lower leg secondary to chronic venous insufficiency we have been using silver collagen under compression. [name] arrives in clinic with a nonviable surface requiring debridement 04/12/2020 upon evaluation today patient appears to be doing well all things considered with regard to her leg ulcer. [name] is tolerating the dressing changes without complication there is minimal dry skin around the edges of the wound that may be trapping and stopping some of the events of the new skin I am can work on that today. Otherwise the surface of the wound appears to be doing excellent. 04/19/2020 upon evaluation today patient appears to be doing well with regard to her leg ulcer. [name] has been tolerating dressing changes without complication. Fortunately there is no signs of active infection at this time. No fever chills noted overall very pleased with how things seem to be progressing. 04/26/2020 on evaluation today patient appears to be doing well with regard to her wound currently. Is  showing signs of excellent improvement overall is filling in nicely and there does not appear to be any signs of infection. No fevers, chills, nausea, vomiting, or diarrhea. 05/03/2020 upon evaluation today patient appears to be doing well with regard to her wound on the leg. This overall showing signs of good improvement which is great [name] has some good epithelial growth and overall I think that things are moving in the correct direction. We likewise going to continue with the wound care measures as before since [name] seems to making such good improvement. 2/3; venous wound on the left medial leg. This is contracting. We are using Prisma and 3 layer compression. [name] has a stocking and waiting in the eventuality this heals. [name] is already using it on the right 05/17/2020 upon evaluation today patient appears to be doing well with regard to her leg ulcer. [name] has been tolerating the dressing changes without complication. ALLAIRE, MCKIBBIN ([id number]) [id number] Page 6 of 10 Fortunately there is no signs of active infection which is great news and overall very pleased with where things stand today. No fevers, chills, nausea, vomiting, or diarrhea. 05/24/2020 upon evaluation today patient appears to be doing well with regard to her wound. Overall I feel like [name] is making excellent progress. There does not appear to be any signs of active infection which is great news. 05/31/2020 upon evaluation today patient appears to be doing well with regard to her wound. There does not appear to be any signs of active infection which is great news overall I am extremely pleased with where things stand today. READMISSION 01/23/2022 [name] returns to clinic today with a new wound on her right posterior calf. [name] says that [name] was cleaning out an old shed near the middle of August this year and then noticed what seemed to be a  bug bite on her right posterior calf. It was itchy, red, and raised. By  the end of September, an ulcer had developed. [name] has been applying various topical creams such as hydrocortisone and others to the site. When it was not improving, [name] made an appointment in the wound care center. ABI in clinic today was 0.97. On her right posterior calf, there is a circular wound with necrotic fat and black eschar present. There is no purulent drainage or malodor. The periwound skin is in good condition with just a little induration that appears to be secondary to inflammation. 01/31/2022: The wound measures a little bit larger today, but overall is quite a bit cleaner. There is some undermining from 9:00 to 3:00. [name] is having some periwound itching and says that her wrap slid. 02/08/2022: Despite using an Unna boot first layer at the top of her wrap, it slid again and it looks like there is been some bruising at the wound site. The wound is about the same size in terms of dimensions. There is a fair amount of slough and nonviable tissue still present. Edema control is better than last week. 02/15/2022: The wound dimensions are about the same. There is less nonviable tissue present. The periwound erythema has improved. 02/22/2022: The wound has deteriorated over the past week. It is larger and the periwound is more edematous, erythematous, and indurated. It looks as though there has been more tissue breakdown with undermining present. [name] is having more pain. There is a foul odor coming from the wound. 02/26/2022: The wound looks quite a bit better today and the odor is gone. I still do not have her culture data back, but [name] seems to be responding well to the Augmentin. 03/06/2022: Her wound continues to improve. There is still some slough accumulation, but the periwound skin is less inflamed. Her culture returned with a polymicrobial population including Pseudomonas and so levofloxacin was also prescribed. [name] did not understand why a second antibiotic was being added so [name] has  not yet initiated this. 03/14/2022: The wound is about the same, to perhaps a little bit larger. There is a fair amount of slough accumulation on the surface, as well as some hypertrophic granulation tissue. [name] is still taking levofloxacin, but has completed taking Augmentin. [name] has her [employer] compound with her today. 12/14; the patient has a significant circular wound on the posterior right calf. [name] is using [place] and silver alginate under 3 layer compression. Her ABIs were within normal limits at 0.97. This may have been traumatic or an insect bite at the start I reviewed these records. 04/04/2022: Since I last saw the wound, it has contracted considerably. There is a fairly thick layer of slough on the surface, as it has not been debrided since the last time I did it. Edema control is excellent and the periwound skin is in much better condition. 04/12/2022: No significant change in the wound dimensions. It is filling with granulation tissue. Still with slough accumulation on the surface. 04/19/2022: The wound is smaller this week. There is some slough accumulation on the surface. 04/26/2022: The wound is smaller again this week and significantly cleaner. The wound surface is a little bit drier than ideal. 05/03/2022: The wound measurements were about the same, but visually it appears smaller. There is still some undermining at the top of the wound. Moisture balance is better this week. 05/10/2022: The wound measured smaller today. There is still a fair amount of undermining present. Slough has built  up on the surface. We are still awaiting snap VAC approval. 05/17/2022: The wound is a little bit smaller today. There is less slough on the surface, but the granulation tissue still is not very robust. [name] has been approved for snap VAC but will have a 20% coinsurance and it is not clear what that amount would end up being for her. 05/27/2022: The surface of the wound has deteriorated and it is gray and  fibrotic. No significant odor, but the drainage on her dressing was a little bit purulent. 05/31/2022: The wound looks quite a bit better today. It is still a little fibrotic but no longer has purulent-looking drainage. The color is better. [name] spoke with her insurance company and is interested in trying the snap VAC, now that [name] is aware of the cost to her. Patient History Information obtained from Patient. Family History Cancer - Mother, Diabetes - Father, Hypertension - Mother, Kidney Disease - Mother, Lung Disease - Father, Thyroid Problems - Paternal Grandparents, No family history of Heart Disease, Seizures, Stroke, Tuberculosis. Social History Never smoker, Marital Status - Married, Alcohol Use - Never, Drug Use - No History, Caffeine Use - Daily - coffee. Medical History Eyes Denies history of Cataracts, Glaucoma, Optic Neuritis Ear/Nose/Mouth/Throat Denies history of Chronic sinus problems/congestion, Middle ear problems Hematologic/Lymphatic Patient has history of Anemia - iron Denies history of Hemophilia, Human Immunodeficiency Virus, Lymphedema, Sickle Cell Disease Respiratory Patient has history of Sleep Apnea - CPAP Denies history of Aspiration, Asthma, Chronic Obstructive Pulmonary Disease (COPD), Pneumothorax, Tuberculosis Cardiovascular Patient has history of Hypertension, Peripheral Venous Disease Denies history of Angina, Arrhythmia, Congestive Heart Failure, Coronary Artery Disease, Deep Vein Thrombosis, Hypotension, Myocardial Infarction, Peripheral Arterial Disease, Phlebitis, Vasculitis Gastrointestinal Denies history of Cirrhosis , Colitis, Crohnoos, Hepatitis A, Hepatitis B, Hepatitis ANJOLI, MARSILI ([id number]) [phone].pdf Page 7 of 10 Endocrine Denies history of Type I Diabetes, Type II Diabetes Genitourinary Denies history of End Stage Renal Disease Immunological Denies history of Lupus Erythematosus, Raynaudoos,  Scleroderma Integumentary (Skin) Denies history of History of Burn Musculoskeletal Denies history of Gout, Rheumatoid Arthritis, Osteoarthritis, Osteomyelitis Neurologic Denies history of Dementia, Neuropathy, Quadriplegia, Paraplegia, Seizure Disorder Oncologic Denies history of Received Chemotherapy, Received Radiation Psychiatric Denies history of Anorexia/bulimia, Confinement Anxiety Hospitalization/Surgery History - cholecystectomy 1980s. - nephrolithasis 1980s. - plate and rod in right elbow surgery 2010. 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 Vitals Time Taken: 2:48 PM, Height: 61 in, Weight: 277 lbs, BMI: 52.3, Respiratory Rate: 16 breaths/min. Respiratory Normal work of breathing on room air. General Notes: 05/31/2022: The wound looks quite a bit better today. It is still a little fibrotic but no longer has purulent-looking drainage. The color is better. Integumentary (Hair, Skin) Wound #2 status is Open. Original cause of wound was Insect Bite. The date acquired was: 11/21/2021. The wound has been in treatment 18 weeks. The wound is located on the Right,Posterior Lower Leg. The wound measures 2.2cm length x 1cm width x 0.5cm depth; 1.728cm^2 area and 0.864cm^3 volume. There is Fat Layer (Subcutaneous Tissue) exposed. There is no tunneling noted, however, there is undermining starting at 11:00 and ending at 1:00 with a maximum distance of 0.6cm. There is a medium amount of purulent drainage noted. The wound margin is distinct with the outline attached to the wound base. There is small (1-33%) red, friable granulation within the wound bed. There is a large (67-100%) amount  of necrotic tissue within the wound bed including Adherent Slough. The periwound skin appearance had no abnormalities noted for moisture. The periwound skin appearance exhibited:  Hemosiderin Staining. The periwound skin appearance did not exhibit: Excoriation, Scarring, Atrophie Blanche, Ecchymosis, Rubor, Erythema. Periwound temperature was noted as No Abnormality. Assessment Active Problems ICD-10 Non-pressure chronic ulcer of right calf with fat layer exposed Morbid (severe) obesity due to excess calories Essential (primary) hypertension Localized edema Procedures Wound #2 Pre-procedure diagnosis of Wound #2 is a Venous Leg Ulcer located on the Right,Posterior Lower Leg .Severity of Tissue Pre Debridement is: Fat layer exposed. There was a Selective/Open Wound Non-Viable Tissue Debridement with a total area of 2.2 sq cm performed by Fredirick Maudlin, MD. With the following instrument(s): Curette to remove Non-Viable tissue/material. Material removed includes Eschar and Slough and after achieving pain control using Lidocaine 5% topical ointment. A time out was conducted at 15:05, prior to the start of the procedure. A Minimum amount of bleeding was controlled with Pressure. The procedure was tolerated well with a pain level of 0 throughout and a pain level of 0 following the procedure. Post Debridement Measurements: 2.2cm length x 1cm width x 0.1cm depth; 0.173cm^3 volume. Character of Wound/Ulcer Post Debridement requires further debridement. Severity of Tissue Post Debridement is: Fat layer exposed. Post procedure Diagnosis Wound #2: Same as Pre-Procedure General Notes: Scribed for Dr. Celine Ahr by [name], RN. ADAMARIE, CARRUBBA ([id number]) [id number] Page 8 of 10 Plan Follow-up Appointments: Return Appointment in 1 week. - Dr. Celine Ahr - Room 2 Anesthetic: (In clinic) Topical Lidocaine 4% applied to wound bed Bathing/ Shower/ Hygiene: May shower with protection but do not get wound dressing(s) wet. Protect dressing(s) with water repellant cover (for example, large plastic bag) or a cast cover and may then take shower. Edema  Control - Lymphedema / SCD / Other: Avoid standing for long periods of time. Exercise regularly Moisturize legs daily. Compression stocking or Garment 20-30 mm/Hg pressure to: - left leg daily. Apply first thing in the morning, remove at night. WOUND #2: - Lower Leg Wound Laterality: Right, Posterior Cleanser: Soap and Water 1 x Per Week/30 Days Discharge Instructions: May shower and wash wound with dial antibacterial soap and water prior to dressing change. Cleanser: Wound Cleanser 1 x Per Week/30 Days Discharge Instructions: Cleanse the wound with wound cleanser prior to applying a clean dressing using gauze sponges, not tissue or cotton balls. Peri-Wound Care: Sween Lotion (Moisturizing lotion) 1 x Per Week/30 Days Discharge Instructions: Apply moisturizing lotion as directed Prim Dressing: SNAP VAC 1 x Per Week/30 Days ary Secondary Dressing: Woven Gauze Sponge, Non-Sterile 4x4 in 1 x Per Week/30 Days Discharge Instructions: Apply over primary dressing as directed. Com pression Wrap: ThreePress (3 layer compression wrap) 1 x Per Week/30 Days Discharge Instructions: Apply three layer compression as directed. Com pression Wrap: Unnaboot w/Calamine, 4x10 (in/yd) 1 x Per Week/30 Days Discharge Instructions: Apply Unnaboot at top of leg 05/31/2022: The wound looks quite a bit better today. It is still a little fibrotic but no longer has purulent-looking drainage. The color is better. I used a curette to debride eschar and slough from her wound. I am wondering if the medium for her [employer] topical antibiotic compound is causing the wound to desiccate somewhat, so I am going to skip the [place] again today. We will apply the snap VAC today. Continue 3 layer compression. Follow-up in 1 week. Electronic Signature(s) Signed: 05/31/2022 3:25:30 PM By: Fredirick Maudlin MD FACS Entered By: Celine Ahr,  Havier Deeb on 05/31/2022  15:25:30 -------------------------------------------------------------------------------- HxROS Details Patient Name: Date of Service: [place], [name] 05/31/2022 2:45 PM Medical Record Number: [id number] Patient Account Number: [id number] Date of Birth/Sex: Treating RN: [birth year] (62 y.o. F) Primary Care Provider: Loralyn Freshwater Other Clinician: Referring Provider: Treating Provider/Extender: Chesley Noon in Treatment: 18 Information Obtained From Patient Eyes Medical History: Negative for: Cataracts; Glaucoma; Optic Neuritis Ear/Nose/Mouth/Throat Medical History: Negative for: Chronic sinus problems/congestion; Middle ear problems Hematologic/Lymphatic Medical History: Positive for: Anemia - iron Negative for: Hemophilia; Human Immunodeficiency Virus; Lymphedema; Sickle Cell Disease Respiratory FONTAINE, STAVELY ([id number]) [phone].pdf Page 9 of 10 Medical History: Positive for: Sleep Apnea - CPAP Negative for: Aspiration; Asthma; Chronic Obstructive Pulmonary Disease (COPD); Pneumothorax; Tuberculosis Cardiovascular Medical History: Positive for: Hypertension; Peripheral Venous Disease Negative for: Angina; Arrhythmia; Congestive Heart Failure; Coronary Artery Disease; Deep Vein Thrombosis; Hypotension; Myocardial Infarction; Peripheral Arterial Disease; Phlebitis; Vasculitis Gastrointestinal Medical History: Negative for: Cirrhosis ; Colitis; Crohns; Hepatitis A; Hepatitis B; Hepatitis C Endocrine Medical History: Negative for: Type I Diabetes; Type II Diabetes Genitourinary Medical History: Negative for: End Stage Renal Disease Past Medical History Notes: years ago kidney stones Immunological Medical History: Negative for: Lupus Erythematosus; Raynauds; Scleroderma Integumentary (Skin) Medical History: Negative for: History of Burn Musculoskeletal Medical History: Negative for: Gout; Rheumatoid  Arthritis; Osteoarthritis; Osteomyelitis Neurologic Medical History: Negative for: Dementia; Neuropathy; Quadriplegia; Paraplegia; Seizure Disorder Oncologic Medical History: Negative for: Received Chemotherapy; Received Radiation Past Medical History Notes: skin Ca removed from back years ago Psychiatric Medical History: Negative for: Anorexia/bulimia; Confinement Anxiety Immunizations Pneumococcal Vaccine: Received Pneumococcal Vaccination: No Implantable Devices None Hospitalization / Surgery History Type of Hospitalization/Surgery cholecystectomy 1980s nephrolithasis 1980s plate and rod in right elbow surgery 2010 Family and Social History Cancer: Yes - Mother; Diabetes: Yes - Father; Heart Disease: No; Hypertension: Yes - Mother; Kidney Disease: Yes - Mother; Lung Disease: Yes - Father; Seizures: No; Stroke: No; Thyroid Problems: Yes - Paternal Grandparents; Tuberculosis: No; Never smoker; Marital Status - Married; Alcohol Use: Never; Drug Use: No History; Caffeine Use: Daily - coffee; Financial Concerns: No; Food, Clothing or Shelter Needs: No; Support System Lacking: NoKADASHIA, KRIEGER ([id number]) [id number] Page 10 of 10 Transportation Concerns: No Electronic Signature(s) Signed: 05/31/2022 3:28:32 PM By: Fredirick Maudlin MD FACS Entered By: Fredirick Maudlin on 05/31/2022 15:23:22 -------------------------------------------------------------------------------- SuperBill Details Patient Name: Date of Service: [address], [name] 05/31/2022 Medical Record Number: [id number] Patient Account Number: [id number] Date of Birth/Sex: Treating RN: [birth year] (62 y.o. F) Primary Care Provider: Loralyn Freshwater Other Clinician: Referring Provider: Treating Provider/Extender: Chesley Noon in Treatment: 18 Diagnosis Coding ICD-10 Codes Code Description [phone] Non-pressure chronic ulcer of right calf with fat layer  exposed E66.01 Morbid (severe) obesity due to excess calories I10 Essential (primary) hypertension R60.0 Localized edema Facility Procedures : CPT4 Code: [id number] Description: [id number] - DEBRIDE WOUND 1ST 20 SQ CM OR < ICD-10 Diagnosis Description L97.212 Non-pressure chronic ulcer of right calf with fat layer exposed Modifier: Quantity: 1 : CPT4 Code: [id number] Description: [id number] - WOUND VAC-50 SQ CM OR LESS Modifier: Quantity: 1 Physician Procedures : CPT4 Code Description Modifier [id number] - WC PHYS LEVEL 4 - EST PT 25 ICD-10 Diagnosis Description L97.212 Non-pressure chronic ulcer of right calf with fat layer exposed E66.01 Morbid (severe) obesity due to excess calories R60.0 Localized edema  I10 Essential (primary) hypertension Quantity: 1 : [id number] - WC PHYS DEBR WO ANESTH 20 SQ CM ICD-10 Diagnosis Description L97.212 Non-pressure  chronic ulcer of right calf with fat layer exposed Quantity: 1 Electronic Signature(s) Signed: 05/31/2022 3:25:51 PM By: Fredirick Maudlin MD FACS Entered By: Fredirick Maudlin on 05/31/2022 15:25:51

## 2022-06-01 NOTE — Progress Notes (Signed)
[name], [name] ([id number]) [id number] Page 1 of 7 Visit Report for 05/31/2022 Arrival Information Details Patient Name: Date of Service: [name], [name] 05/31/2022 2:45 PM Medical Record Number: [id number] Patient Account Number: [id number] Date of Birth/Sex: Treating RN: [birth year] (62 y.o. [name], [name] Primary Care [name]: [name] Other Clinician: Referring [name]: Treating [name]/Extender: [name] in Treatment: 18 Visit Information History Since Last Visit Added or deleted any medications: No Patient Arrived: Ambulatory Any new allergies or adverse reactions: No Arrival Time: 14:48 Had a fall or experienced change in No Accompanied By: self activities of [name] living that may affect Transfer Assistance: None risk of falls: Patient Identification Verified: Yes Signs or symptoms of abuse/neglect since last visito No Secondary Verification Process Completed: Yes Hospitalized since last visit: No Patient Requires Transmission-Based Precautions: No Implantable device outside of the clinic excluding No Patient Has Alerts: No cellular tissue based products placed in the center since last visit: Has Compression in Place as Prescribed: Yes Pain Present Now: No Electronic Signature(s) Signed: 05/31/2022 3:38:57 PM By: [name] East RN Entered By: [name] on 05/31/2022 14:48:43 -------------------------------------------------------------------------------- Encounter Discharge Information Details Patient Name: Date of Service: [name], [name] 05/31/2022 2:45 PM Medical Record Number: [id number] Patient Account Number: [id number] Date of Birth/Sex: Treating RN: Oct 28, 1960 (62 y.o. [name] Primary Care [name]: [name] Other Clinician: Referring [name]: Treating [name]/Extender: [name] in Treatment: 18 Encounter Discharge Information  Items Post Procedure Vitals Discharge Condition: Stable Temperature (F): 98.0 Ambulatory Status: Ambulatory Pulse (bpm): 93 Discharge Destination: Home Respiratory Rate (breaths/min): 18 Transportation: Private Auto Blood Pressure (mmHg): 127/72 Accompanied By: self Schedule Follow-up Appointment: Yes Clinical Summary of Care: Electronic Signature(s) Signed: 05/31/2022 3:37:18 PM By: [name] East RN Entered By: [name] on 05/31/2022 15:37:18 -------------------------------------------------------------------------------- Lower Extremity Assessment Details Patient Name: Date of Service: [name], [name] [name] 05/31/2022 2:45 PM Medical Record Number: [id number] Patient Account Number: [id number] Date of Birth/Sex: Treating RN: [birth year] (62 y.o. [name] Primary Care Marcello Tuzzolino: [name] Other Clinician: Referring Naraya Stoneberg: Treating [name]/Extender: [name] in Treatment: 18 Edema Assessment Assessed: [name]: No] [name]: No] W[LeftMELLANY, SCHLENDER [id number] [[id number] Page 2 of 7] Edema: [Left: Ye] [Right: s] Calf Left: Right: Point of Measurement: From Medial Instep 43 cm Ankle Left: Right: Point of Measurement: From Medial Instep 24.5 cm Vascular Assessment Pulses: Dorsalis Pedis Palpable: [Right:Yes] Electronic Signature(s) Signed: 05/31/2022 3:38:57 PM By: [name] East RN Entered By: [name] on 05/31/2022 14:49:15 -------------------------------------------------------------------------------- Multi Wound Chart Details Patient Name: Date of Service: [name], [name] 05/31/2022 2:45 PM Medical Record Number: [id number] Patient Account Number: [id number] Date of Birth/Sex: Treating RN: [birth year] (62 y.o. F) Primary Care [name]: [name] Other Clinician: Referring [name]: Treating [name]/Extender: [name] in Treatment:  18 Vital Signs Height(in): 61 Pulse(bpm): Weight(lbs): [id number] Blood Pressure(mmHg): Body Mass Index(BMI): 52.3 Temperature(F): Respiratory Rate(breaths/min): 16 [2:Photos:] [N/A:N/A] Right, Posterior Lower Leg N/A N/A Wound Location: Insect Bite N/A N/A Wounding Event: Venous Leg Ulcer N/A N/A Primary Etiology: Anemia, Sleep Apnea, Hypertension, N/A N/A Comorbid History: Peripheral Venous Disease 11/21/2021 N/A N/A Date Acquired: 1 N/A N/A Weeks of Treatment: Open N/A N/A Wound Status: No N/A N/A Wound Recurrence: 2.2x1x0.5 N/A N/A Measurements L x W x D (cm) 1.728 N/A N/A A (cm) : rea 0.864 N/A N/A Volume (cm) : -423.60% N/A N/A % Reduction in A rea: -772.70% N/A N/A %  Reduction in Volume: 11 Starting Position 1 (o'clock): 1 Ending Position 1 (o'clock): 0.6 Maximum Distance 1 (cm): Yes N/A N/A Undermining: Full Thickness Without Exposed N/A N/A Classification: Support Structures Medium N/A N/A Exudate Amount: Purulent N/A N/A Exudate Type: yellow, brown, green N/A N/A Exudate Color: Distinct, outline attached N/A N/A Wound Margin: Small (1-33%) N/A N/A Granulation AmountCAMERA, HAKER ([id number] Page 3 of 7 Red, Friable N/A N/A Granulation Quality: Large (67-100%) N/A N/A Necrotic Amount: Fat Layer (Subcutaneous Tissue): Yes N/A N/A Exposed Structures: Fascia: No Tendon: No Muscle: No Joint: No Bone: No Small (1-33%) N/A N/A Epithelialization: Debridement - Selective/Open Wound N/A N/A Debridement: Pre-procedure Verification/Time Out 15:05 N/A N/A Taken: Lidocaine 5% topical ointment N/A N/A Pain Control: Necrotic/Eschar, Slough N/A N/A Tissue Debrided: Non-Viable Tissue N/A N/A Level: 2.2 N/A N/A Debridement A (sq cm): rea Curette N/A N/A Instrument: Minimum N/A N/A Bleeding: Pressure N/A N/A Hemostasis A chieved: 0 N/A N/A Procedural Pain: 0 N/A N/A Post Procedural  Pain: Procedure was tolerated well N/A N/A Debridement Treatment Response: 2.2x1x0.1 N/A N/A Post Debridement Measurements L x W x D (cm) 0.173 N/A N/A Post Debridement Volume: (cm) Excoriation: No N/A N/A Periwound Skin Texture: Scarring: No No Abnormalities Noted N/A N/A Periwound Skin Moisture: Hemosiderin Staining: Yes N/A N/A Periwound Skin Color: Atrophie [name]: No Ecchymosis: No Erythema: No Rubor: No No Abnormality N/A N/A Temperature: Debridement N/A N/A Procedures Performed: Negative Pressure Wound Therapy Application (NPWT) Treatment Notes Electronic Signature(s) Signed: 05/31/2022 3:20:17 PM By: Fredirick Maudlin MD FACS Entered By: Fredirick Maudlin on 05/31/2022 15:20:17 -------------------------------------------------------------------------------- Multi-Disciplinary Care Plan Details Patient Name: Date of Service: [name], [name] 05/31/2022 2:45 PM Medical Record Number: [id number] Patient Account Number: [id number] Date of Birth/Sex: Treating RN: September 25, 1960 (62 y.o. [name] Primary Care Asad Keeven: [name] Other Clinician: Referring Tenisha Fleece: Treating Shacarra Choe/Extender: [name] in Treatment: [place] reviewed with physician Active Inactive Necrotic Tissue Nursing Diagnoses: Impaired tissue integrity related to necrotic/devitalized tissue Knowledge deficit related to management of necrotic/devitalized tissue Goals: Necrotic/devitalized tissue will be minimized in the wound bed Date Initiated: 01/23/2022 Target Resolution Date: 07/05/2022 Goal Status: Active Patient/caregiver will verbalize understanding of reason and process for debridement of necrotic tissue Date Initiated: 01/23/2022 Target Resolution Date: 07/05/2022 Goal Status: Active Interventions: Assess patient pain level pre-, during and post procedure and prior to discharge Provide education on necrotic tissue and  debridement process LANEISHA, HOLDERBAUM ([id number]) [id number].pdf Page 4 of 7 Treatment Activities: Apply topical anesthetic as ordered : 01/23/2022 Notes: Wound/Skin Impairment Nursing Diagnoses: Impaired tissue integrity Knowledge deficit related to ulceration/compromised skin integrity Goals: Patient/caregiver will verbalize understanding of skin care regimen Date Initiated: 01/23/2022 Target Resolution Date: 07/05/2022 Goal Status: Active Interventions: Assess patient/caregiver ability to obtain necessary supplies Assess ulceration(s) every visit Treatment Activities: Skin care regimen initiated : 01/23/2022 Topical wound management initiated : 01/23/2022 Notes: Electronic Signature(s) Signed: 05/31/2022 3:03:18 PM By: [name] East RN Entered By: [name] on 05/31/2022 15:03:17 -------------------------------------------------------------------------------- Negative Pressure Wound Therapy Application (NPWT) Details Patient Name: Date of Service: [name] 05/31/2022 2:45 PM Medical Record Number: [id number] Patient Account Number: [id number] Date of Birth/Sex: Treating RN: [birth year] (62 y.o. [name] Primary Care Latica Hohmann: [name] Other Clinician: Referring Cabela Pacifico: Treating Royelle Hinchman/Extender: [name] in Treatment: 18 NPWT Application Performed for: Wound #2 Right, Posterior Lower Leg Performed By: [name] East, RN Type: Other Coverage Size (sq cm): 2.2 Pressure Type: Constant  Pressure Setting: 125 mmHG Drain Type: None Primary Contact: None Quantity of Sponges/Gauze Inserted: 1 Sponge/Dressing Type: Foam, Blue Date Initiated: 05/31/2022 Response to Treatment: tolerated well Post Procedure Diagnosis Same as Pre-procedure Electronic Signature(s) Signed: 05/31/2022 3:38:57 PM By: [name] East RN Entered By: [name] on 05/31/2022  15:09:27 -------------------------------------------------------------------------------- Pain Assessment Details Patient Name: Date of Service: [name], [name] [name] 05/31/2022 2:45 PM Medical Record Number: [id number] Patient Account Number: [id number] Date of Birth/Sex: Treating RN: 08-09-1960 (61 y.o. [name] Primary Care Billijo Dilling: [name] Other Clinician: Referring Ellanore Vanhook: Treating Mackinzie Vuncannon/Extender: [name] in Treatment: [address], [place] H ([id number]) [id number] Page 5 of 7 Active Problems Location of Pain Severity and Description of Pain Patient Has Paino No Site Locations Rate the pain. Current Pain Level: 0 Pain Management and Medication Current Pain Management: Electronic Signature(s) Signed: 05/31/2022 3:38:57 PM By: [name] East RN Entered By: [name] on 05/31/2022 14:49:02 -------------------------------------------------------------------------------- Patient/Caregiver Education Details Patient Name: Date of Service: [name], [name] 2/23/2024andnbsp2:45 PM Medical Record Number: [id number] Patient Account Number: [id number] Date of Birth/Gender: Treating RN: 1960/10/16 (62 y.o. [name] Primary Care Physician: [name] Other Clinician: Referring Physician: Treating Physician/Extender: [name] in Treatment: 18 Education Assessment Education Provided To: Patient Education Topics Provided Wound Debridement: Methods: Explain/Verbal Responses: Reinforcements needed, State content correctly Wound/Skin Impairment: Methods: Explain/Verbal Responses: Reinforcements needed, State content correctly Electronic Signature(s) Signed: 05/31/2022 3:38:57 PM By: [name] East RN Entered By: [name] on 05/31/2022 15:03:37 -------------------------------------------------------------------------------- Wound Assessment Details Patient  Name: Date of Service: [place], [name] [name] 05/31/2022 2:45 PM Medical Record Number: [id number] Patient Account Number: [id number] Date of Birth/Sex: Treating RN: 04-17-1960 (62 y.o. [name] Primary Care Prentis Langdon: [name] Other Clinician: Referring Emilina Smarr: Treating Koston Hennes/Extender: Thyra Breed [place], Lillia Abed ([id number]) [id number] Page 6 of 7 Weeks in Treatment: 18 Wound Status Wound Number: 2 Primary Venous Leg Ulcer Etiology: Wound Location: Right, Posterior Lower Leg Wound Status: Open Wounding Event: Insect Bite Comorbid Anemia, Sleep Apnea, Hypertension, Peripheral Venous Date Acquired: 11/21/2021 History: Disease Weeks Of Treatment: 18 Clustered Wound: No Photos Wound Measurements Length: (cm) 2.2 Width: (cm) 1 Depth: (cm) 0.5 Area: (cm) 1.728 Volume: (cm) 0.864 % Reduction in Area: -423.6% % Reduction in Volume: -772.7% Epithelialization: Small (1-33%) Tunneling: No Undermining: Yes Starting Position (o'clock): 11 Ending Position (o'clock): 1 Maximum Distance: (cm) 0.6 Wound Description Classification: Full Thickness Without Exposed Support Structures Wound Margin: Distinct, outline attached Exudate Amount: Medium Exudate Type: Purulent Exudate Color: yellow, brown, green Foul Odor After Cleansing: No Slough/Fibrino Yes Wound Bed Granulation Amount: Small (1-33%) Exposed Structure Granulation Quality: Red, Friable Fascia Exposed: No Necrotic Amount: Large (67-100%) Fat Layer (Subcutaneous Tissue) Exposed: Yes Necrotic Quality: Adherent Slough Tendon Exposed: No Muscle Exposed: No Joint Exposed: No Bone Exposed: No Periwound Skin Texture Texture Color No Abnormalities Noted: No No Abnormalities Noted: No Excoriation: No Atrophie [name]: No Scarring: No Ecchymosis: No Erythema: No Moisture Hemosiderin Staining: Yes No Abnormalities Noted: Yes Rubor: No Temperature /  Pain Temperature: No Abnormality Treatment Notes Wound #2 (Lower Leg) Wound Laterality: Right, Posterior Cleanser Soap and Water Discharge Instruction: May shower and wash wound with dial antibacterial soap and water prior to dressing change. Wound Cleanser Discharge Instruction: Cleanse the wound with wound cleanser prior to applying a clean dressing using gauze sponges, not tissue or cotton balls. SURIE, SMID ([id number]) [id number] Page 7 of 7 Peri-Wound Care Sween Lotion (Moisturizing lotion)  Discharge Instruction: Apply moisturizing lotion as directed Topical Primary Dressing SNAP VAC Secondary Dressing Secured With Compression Wrap ThreePress (3 layer compression wrap) Discharge Instruction: Apply three layer compression as directed. Unnaboot w/Calamine, 4x10 (in/yd) Discharge Instruction: Apply Unnaboot at top of leg Compression Stockings Add-Ons Electronic Signature(s) Signed: 05/31/2022 3:38:57 PM By: [name] East RN Entered By: [name] on 05/31/2022 14:59:24 -------------------------------------------------------------------------------- Vitals Details Patient Name: Date of Service: [name], [name] 05/31/2022 2:45 PM Medical Record Number: [id number] Patient Account Number: [id number] Date of Birth/Sex: Treating RN: 26-Aug-1960 (62 y.o. [name] Primary Care Shaunette Gassner: [name] Other Clinician: Referring Kunta Hilleary: Treating Joscelyn Hardrick/Extender: [name] in Treatment: 18 Vital Signs Time Taken: 14:48 Respiratory Rate (breaths/min): 16 Height (in): 61 Reference Range: 80 - 120 mg / dl Weight (lbs): 277 Body Mass Index (BMI): 52.3 Electronic Signature(s) Signed: 05/31/2022 3:38:57 PM By: [name] East RN Entered By: [name] on 05/31/2022 14:48:56

## 2022-06-02 ENCOUNTER — Ambulatory Visit (HOSPITAL_BASED_OUTPATIENT_CLINIC_OR_DEPARTMENT_OTHER)
Admission: RE | Admit: 2022-06-02 | Discharge: 2022-06-02 | Disposition: A | Discharge status: Home / Self Care | Payer: BC Managed Care – PPO | Source: Ambulatory Visit | Attending: Nurse Practitioner | Admitting: Nurse Practitioner

## 2022-06-02 DIAGNOSIS — R0609 Other forms of dyspnea: Secondary | ICD-10-CM | POA: Insufficient documentation

## 2022-06-02 DIAGNOSIS — J984 Other disorders of lung: Secondary | ICD-10-CM | POA: Diagnosis present

## 2022-06-03 ENCOUNTER — Encounter (HOSPITAL_BASED_OUTPATIENT_CLINIC_OR_DEPARTMENT_OTHER): Payer: Self-pay

## 2022-06-03 ENCOUNTER — Encounter (HOSPITAL_BASED_OUTPATIENT_CLINIC_OR_DEPARTMENT_OTHER): Payer: BC Managed Care – PPO | Admitting: General Surgery

## 2022-06-05 NOTE — Progress Notes (Signed)
Please notify patient there is no evidence of interstitial lung disease, which is good news. She did have some air trapping, which you can see with asthma. Continue breo as discussed. Thanks.

## 2022-06-07 ENCOUNTER — Encounter (HOSPITAL_BASED_OUTPATIENT_CLINIC_OR_DEPARTMENT_OTHER): Payer: BC Managed Care – PPO | Attending: General Surgery | Admitting: General Surgery

## 2022-06-07 DIAGNOSIS — I10 Essential (primary) hypertension: Secondary | ICD-10-CM | POA: Insufficient documentation

## 2022-06-07 DIAGNOSIS — R600 Localized edema: Secondary | ICD-10-CM | POA: Diagnosis not present

## 2022-06-07 DIAGNOSIS — L97212 Non-pressure chronic ulcer of right calf with fat layer exposed: Secondary | ICD-10-CM | POA: Diagnosis present

## 2022-06-07 DIAGNOSIS — Z6841 Body Mass Index (BMI) 40.0 and over, adult: Secondary | ICD-10-CM | POA: Insufficient documentation

## 2022-06-10 NOTE — Progress Notes (Signed)
[name], [name] ([id number]) [id number] Page 1 of 10 Visit Report for 06/07/2022 Chief Complaint Document Details Patient Name: Date of Service: [place] 06/07/2022 2:45 PM Medical Record Number: [id number] Patient Account Number: [id number] Date of Birth/Sex: Treating RN: 07-09-1960 (62 y.o. F) Primary Care Provider: Loralyn Freshwater Other Clinician: Referring Provider: Treating Provider/Extender: Chesley Noon in Treatment: 19 Information Obtained from: Patient Chief Complaint RLE ulcer Electronic Signature(s) Signed: 06/07/2022 3:31:50 PM By: Fredirick Maudlin MD FACS Entered By: Fredirick Maudlin on 06/07/2022 15:31:50 -------------------------------------------------------------------------------- Debridement Details Patient Name: Date of Service: [name], [name] 06/07/2022 2:45 PM Medical Record Number: [id number] Patient Account Number: [id number] Date of Birth/Sex: Treating RN: 05/31/1960 (62 y.o. [name], [name] Primary Care Provider: Loralyn Freshwater Other Clinician: Referring Provider: Treating Provider/Extender: Chesley Noon in Treatment: 19 Debridement Performed for Assessment: Wound #2 Right,Posterior Lower Leg Performed By: Physician Fredirick Maudlin, MD Debridement Type: Debridement Severity of Tissue Pre Debridement: Fat layer exposed Level of Consciousness (Pre-procedure): Awake and Alert Pre-procedure Verification/Time Out Yes - 15:10 Taken: Start Time: 15:11 Pain Control: Lidocaine 5% topical ointment T Area Debrided (L x W): otal 2 (cm) x 2 (cm) = 4 (cm) Tissue and other material debrided: Non-Viable, Slough, Slough Level: Non-Viable Tissue Debridement Description: Selective/Open Wound Instrument: Curette Bleeding: Minimum Hemostasis Achieved: Pressure Response to Treatment: Procedure was tolerated well Level of Consciousness (Post- Awake and  Alert procedure): Post Debridement Measurements of Total Wound Length: (cm) 2 Width: (cm) 2 Depth: (cm) 0.3 Volume: (cm) 0.942 Character of Wound/Ulcer Post Debridement: Improved Severity of Tissue Post Debridement: Fat layer exposed Post Procedure Diagnosis Same as Pre-procedure Notes Scribed for Dr. Celine Ahr by [name], RN Electronic Signature(s) Signed: 06/07/2022 3:35:49 PM By: Fredirick Maudlin MD FACS Signed: 06/07/2022 4:09:38 PM By: [name] RN Entered By: [name] on 06/07/2022 15:13:36 [name] ([id number]) [id number] Page 2 of 10 -------------------------------------------------------------------------------- HPI Details Patient Name: Date of Service: [name] 06/07/2022 2:45 PM Medical Record Number: [id number] Patient Account Number: [id number] Date of Birth/Sex: Treating RN: 27-Aug-1960 (62 y.o. F) Primary Care Provider: Loralyn Freshwater Other Clinician: Referring Provider: Treating Provider/Extender: Chesley Noon in Treatment: 19 History of Present Illness HPI Description: 02/16/2020 upon evaluation today patient actually appears to be doing poorly in regard to her left medial lower extremity ulcer. This is actually an area that she tells me she has had intermittent issues with over the years although has been closed for some time she typically uses compression right now she has juxta lite compression wraps. With that being said she tells me that this nonetheless open several weeks/months ago and has been given her trouble since. She does have a history of chronic venous insufficiency she is seeing specialist for this in the past she has had an ablation as well as sclerotherapy. With that being said she also has hypertension chronically which is managed by her primary care provider. In general she seems to be worsening overall with regard to the wound and states that she finally realized  that she needed to come in and have somebody look at this and not continue to try to manage this on her own. No fevers, chills, nausea, vomiting, or diarrhea. 02/23/2020 on evaluation today patient appears to be doing well with regard to her wound. This is showing some signs of improvement which is great news still were not quite at the point where I would like to be as  far as the overall appearance of the wound is concerned but I do believe this is better than last week. I do believe the Iodoflex is helping as well. 03/08/2020 upon evaluation today patient appears to be doing well with regard to her wound. She has been tolerating the dressing changes without complication. Fortunately I feel like she has made great progress with the Iodoflex but I feel like it may be the point rest to switch to something else possibly a collagen- based dressing at this time. 03/15/2020 upon evaluation today patient appears to be doing excellent in regard to her leg ulcer. She has been tolerating the dressing changes without complication. Fortunately there is no signs of active infection. Overall she is measuring a little bit smaller today which is great news. 03/22/2020 upon evaluation today patient appears to be doing well with regard to her wound. She has been tolerating the dressing changes without complication. Fortunately there is no signs of active infection at this time. 03/28/2020; patient I do not usually see however she has a wound on the left anterior lower leg secondary to chronic venous insufficiency we have been using silver collagen under compression. She arrives in clinic with a nonviable surface requiring debridement 04/12/2020 upon evaluation today patient appears to be doing well all things considered with regard to her leg ulcer. She is tolerating the dressing changes without complication there is minimal dry skin around the edges of the wound that may be trapping and stopping some of the events of the  new skin I am can work on that today. Otherwise the surface of the wound appears to be doing excellent. 04/19/2020 upon evaluation today patient appears to be doing well with regard to her leg ulcer. She has been tolerating dressing changes without complication. Fortunately there is no signs of active infection at this time. No fever chills noted overall very pleased with how things seem to be progressing. 04/26/2020 on evaluation today patient appears to be doing well with regard to her wound currently. Is showing signs of excellent improvement overall is filling in nicely and there does not appear to be any signs of infection. No fevers, chills, nausea, vomiting, or diarrhea. 05/03/2020 upon evaluation today patient appears to be doing well with regard to her wound on the leg. This overall showing signs of good improvement which is great she has some good epithelial growth and overall I think that things are moving in the correct direction. We likewise going to continue with the wound care measures as before since she seems to making such good improvement. 2/3; venous wound on the left medial leg. This is contracting. We are using Prisma and 3 layer compression. She has a stocking and waiting in the eventuality this heals. She is already using it on the right 05/17/2020 upon evaluation today patient appears to be doing well with regard to her leg ulcer. She has been tolerating the dressing changes without complication. Fortunately there is no signs of active infection which is great news and overall very pleased with where things stand today. No fevers, chills, nausea, vomiting, or diarrhea. 05/24/2020 upon evaluation today patient appears to be doing well with regard to her wound. Overall I feel like she is making excellent progress. There does not appear to be any signs of active infection which is great news. 05/31/2020 upon evaluation today patient appears to be doing well with regard to her wound.  There does not appear to be any signs of active infection which is  great news overall I am extremely pleased with where things stand today. READMISSION 01/23/2022 She returns to clinic today with a new wound on her right posterior calf. She says that she was cleaning out an old shed near the middle of August this year and then noticed what seemed to be a bug bite on her right posterior calf. It was itchy, red, and raised. By the end of September, an ulcer had developed. She has been applying various topical creams such as hydrocortisone and others to the site. When it was not improving, she made an appointment in the wound care center. ABI in clinic today was 0.97. On her right posterior calf, there is a circular wound with necrotic fat and black eschar present. There is no purulent drainage or malodor. The periwound skin is in good condition with just a little induration that appears to be secondary to inflammation. 01/31/2022: The wound measures a little bit larger today, but overall is quite a bit cleaner. There is some undermining from 9:00 to 3:00. She is having some periwound itching and says that her wrap slid. 02/08/2022: Despite using an Unna boot first layer at the top of her wrap, it slid again and it looks like there is been some bruising at the wound site. The wound is about the same size in terms of dimensions. There is a fair amount of slough and nonviable tissue still present. Edema control is better than last week. 02/15/2022: The wound dimensions are about the same. There is less nonviable tissue present. The periwound erythema has improved. 02/22/2022: The wound has deteriorated over the past week. It is larger and the periwound is more edematous, erythematous, and indurated. It looks as though there has been more tissue breakdown with undermining present. She is having more pain. There is a foul odor coming from the wound. 02/26/2022: The wound looks quite a bit better today and  the odor is gone. I still do not have her culture data back, but she seems to be responding well to the Augmentin. 03/06/2022: Her wound continues to improve. There is still some slough accumulation, but the periwound skin is less inflamed. Her culture returned with a JULIAHNA, [name] ([id number]) [id number] Page 3 of 10 polymicrobial population including Pseudomonas and so levofloxacin was also prescribed. She did not understand why a second antibiotic was being added so she has not yet initiated this. 03/14/2022: The wound is about the same, to perhaps a little bit larger. There is a fair amount of slough accumulation on the surface, as well as some hypertrophic granulation tissue. She is still taking levofloxacin, but has completed taking Augmentin. She has her [employer] compound with her today. 12/14; the patient has a significant circular wound on the posterior right calf. She is using [place] and silver alginate under 3 layer compression. Her ABIs were within normal limits at 0.97. This may have been traumatic or an insect bite at the start I reviewed these records. 04/04/2022: Since I last saw the wound, it has contracted considerably. There is a fairly thick layer of slough on the surface, as it has not been debrided since the last time I did it. Edema control is excellent and the periwound skin is in much better condition. 04/12/2022: No significant change in the wound dimensions. It is filling with granulation tissue. Still with slough accumulation on the surface. 04/19/2022: The wound is smaller this week. There is some slough accumulation on the surface. 04/26/2022: The wound is smaller again this week  and significantly cleaner. The wound surface is a little bit drier than ideal. 05/03/2022: The wound measurements were about the same, but visually it appears smaller. There is still some undermining at the top of the wound. Moisture balance is better this  week. 05/10/2022: The wound measured smaller today. There is still a fair amount of undermining present. Slough has built up on the surface. We are still awaiting snap VAC approval. 05/17/2022: The wound is a little bit smaller today. There is less slough on the surface, but the granulation tissue still is not very robust. She has been approved for snap VAC but will have a 20% coinsurance and it is not clear what that amount would end up being for her. 05/27/2022: The surface of the wound has deteriorated and it is gray and fibrotic. No significant odor, but the drainage on her dressing was a little bit purulent. 05/31/2022: The wound looks quite a bit better today. It is still a little fibrotic but no longer has purulent-looking drainage. The color is better. She spoke with her insurance company and is interested in trying the snap VAC, now that she is aware of the cost to her. 06/07/2022: After 1 week in the snap VAC, there has been substantial improvement to her wound. The undermined portion is closing in. The surface has a healthier color and appearance. There is very minimal slough accumulation. Electronic Signature(s) Signed: 06/07/2022 3:33:32 PM By: Fredirick Maudlin MD FACS Entered By: Fredirick Maudlin on 06/07/2022 15:33:32 -------------------------------------------------------------------------------- Physical Exam Details Patient Name: Date of Service: [name], [name] 06/07/2022 2:45 PM Medical Record Number: [id number] Patient Account Number: [id number] Date of Birth/Sex: Treating RN: Jul 11, 1960 (62 y.o. F) Primary Care Provider: Loralyn Freshwater Other Clinician: Referring Provider: Treating Provider/Extender: Chesley Noon in Treatment: 19 Constitutional . . . . no acute distress. Respiratory Normal work of breathing on room air. Notes 06/07/2022: After 1 week in the snap VAC, there has been substantial improvement to her wound. The undermined portion is  closing in. The surface has a healthier color and appearance. There is very minimal slough accumulation. Electronic Signature(s) Signed: 06/07/2022 3:33:57 PM By: Fredirick Maudlin MD FACS Entered By: Fredirick Maudlin on 06/07/2022 15:33:57 -------------------------------------------------------------------------------- Physician Orders Details Patient Name: Date of Service: [address], [name] 06/07/2022 2:45 PM Medical Record Number: [id number] Patient Account Number: [id number] Date of Birth/Sex: Treating RN: 1960-09-29 (62 y.o. [name] Primary Care Provider: Loralyn Freshwater Other Clinician: Referring Provider: Treating Provider/Extender: Chesley Noon in Treatment: 2 Verbal / Phone Orders: No Diagnosis Coding GIAVANA, GATH ([id number]) [id number] Page 4 of 10 ICD-10 Coding Code Description [phone] Non-pressure chronic ulcer of right calf with fat layer exposed E66.01 Morbid (severe) obesity due to excess calories I10 Essential (primary) hypertension R60.0 Localized edema Follow-up Appointments ppointment in 1 week. - Dr. Celine Ahr - Room 3 Return A Friday 06/14/22 at 3:00pm Anesthetic (In clinic) Topical Lidocaine 4% applied to wound bed Bathing/ Shower/ Hygiene May shower with protection but do not get wound dressing(s) wet. Protect dressing(s) with water repellant cover (for example, large plastic bag) or a cast cover and may then take shower. Edema Control - Lymphedema / SCD / Other Bilateral Lower Extremities Avoid standing for long periods of time. Exercise regularly Moisturize legs daily. Compression stocking or Garment 20-30 mm/Hg pressure to: - left leg daily. Apply first thing in the morning, remove at night. Wound Treatment Wound #2 - Lower Leg Wound Laterality: Right, Posterior Cleanser:  Soap and Water 1 x Per Week/30 Days Discharge Instructions: May shower and wash wound with dial antibacterial soap  and water prior to dressing change. Cleanser: Wound Cleanser 1 x Per Week/30 Days Discharge Instructions: Cleanse the wound with wound cleanser prior to applying a clean dressing using gauze sponges, not tissue or cotton balls. Peri-Wound Care: Sween Lotion (Moisturizing lotion) 1 x Per Week/30 Days Discharge Instructions: Apply moisturizing lotion as directed Prim Dressing: SNAP VAC ary 1 x Per Week/30 Days Secondary Dressing: Woven Gauze Sponge, Non-Sterile 4x4 in 1 x Per Week/30 Days Discharge Instructions: Apply over primary dressing as directed. Compression Wrap: ThreePress (3 layer compression wrap) 1 x Per Week/30 Days Discharge Instructions: Apply three layer compression as directed. Compression Wrap: Unnaboot w/Calamine, 4x10 (in/yd) 1 x Per Week/30 Days Discharge Instructions: Apply Unnaboot at top of leg Electronic Signature(s) Signed: 06/07/2022 3:35:49 PM By: Fredirick Maudlin MD FACS Previous Signature: 06/07/2022 3:06:54 PM Version By: [name] RN Entered By: Fredirick Maudlin on 06/07/2022 15:34:18 -------------------------------------------------------------------------------- Problem List Details Patient Name: Date of Service: [name], [name] 06/07/2022 2:45 PM Medical Record Number: [id number] Patient Account Number: [id number] Date of Birth/Sex: Treating RN: 11-13-1960 (62 y.o. F) Primary Care Provider: Loralyn Freshwater Other Clinician: Referring Provider: Treating Provider/Extender: Chesley Noon in Treatment: 19 Active Problems ICD-10 Encounter Code Description Active Date MDM Diagnosis L97.212 Non-pressure chronic ulcer of right calf with fat layer exposed 01/23/2022 No Yes [name], ERI[place]SS[place] ([id number]) [phone].pdf Page 5 of 10 E66.01 Morbid (severe) obesity due to excess calories 01/23/2022 No Yes I10 Essential (primary) hypertension 01/23/2022 No Yes R60.0 Localized edema 01/23/2022 No Yes Inactive  Problems Resolved Problems Electronic Signature(s) Signed: 06/07/2022 3:31:36 PM By: Fredirick Maudlin MD FACS Entered By: Fredirick Maudlin on 06/07/2022 15:31:36 -------------------------------------------------------------------------------- Progress Note Details Patient Name: Date of Service: [name], [name] 06/07/2022 2:45 PM Medical Record Number: [id number] Patient Account Number: [id number] Date of Birth/Sex: Treating RN: March 03, 1961 (62 y.o. F) Primary Care Provider: Loralyn Freshwater Other Clinician: Referring Provider: Treating Provider/Extender: Chesley Noon in Treatment: 19 Subjective Chief Complaint Information obtained from Patient RLE ulcer History of Present Illness (HPI) 02/16/2020 upon evaluation today patient actually appears to be doing poorly in regard to her left medial lower extremity ulcer. This is actually an area that she tells me she has had intermittent issues with over the years although has been closed for some time she typically uses compression right now she has juxta lite compression wraps. With that being said she tells me that this nonetheless open several weeks/months ago and has been given her trouble since. She does have a history of chronic venous insufficiency she is seeing specialist for this in the past she has had an ablation as well as sclerotherapy. With that being said she also has hypertension chronically which is managed by her primary care provider. In general she seems to be worsening overall with regard to the wound and states that she finally realized that she needed to come in and have somebody look at this and not continue to try to manage this on her own. No fevers, chills, nausea, vomiting, or diarrhea. 02/23/2020 on evaluation today patient appears to be doing well with regard to her wound. This is showing some signs of improvement which is great news still were not quite at the point where I would like to  be as far as the overall appearance of the wound is concerned but I do believe this is  better than last week. I do believe the Iodoflex is helping as well. 03/08/2020 upon evaluation today patient appears to be doing well with regard to her wound. She has been tolerating the dressing changes without complication. Fortunately I feel like she has made great progress with the Iodoflex but I feel like it may be the point rest to switch to something else possibly a collagen- based dressing at this time. 03/15/2020 upon evaluation today patient appears to be doing excellent in regard to her leg ulcer. She has been tolerating the dressing changes without complication. Fortunately there is no signs of active infection. Overall she is measuring a little bit smaller today which is great news. 03/22/2020 upon evaluation today patient appears to be doing well with regard to her wound. She has been tolerating the dressing changes without complication. Fortunately there is no signs of active infection at this time. 03/28/2020; patient I do not usually see however she has a wound on the left anterior lower leg secondary to chronic venous insufficiency we have been using silver collagen under compression. She arrives in clinic with a nonviable surface requiring debridement 04/12/2020 upon evaluation today patient appears to be doing well all things considered with regard to her leg ulcer. She is tolerating the dressing changes without complication there is minimal dry skin around the edges of the wound that may be trapping and stopping some of the events of the new skin I am can work on that today. Otherwise the surface of the wound appears to be doing excellent. 04/19/2020 upon evaluation today patient appears to be doing well with regard to her leg ulcer. She has been tolerating dressing changes without complication. Fortunately there is no signs of active infection at this time. No fever chills noted overall very  pleased with how things seem to be progressing. 04/26/2020 on evaluation today patient appears to be doing well with regard to her wound currently. Is showing signs of excellent improvement overall is filling in nicely and there does not appear to be any signs of infection. No fevers, chills, nausea, vomiting, or diarrhea. 05/03/2020 upon evaluation today patient appears to be doing well with regard to her wound on the leg. This overall showing signs of good improvement which is great she has some good epithelial growth and overall I think that things are moving in the correct direction. We likewise going to continue with the wound care measures as before since she seems to making such good improvement. 2/3; venous wound on the left medial leg. This is contracting. We are using Prisma and 3 layer compression. She has a stocking and waiting in the eventuality this heals. She is already using it on the right [name], [name] ([id number]) [phone].pdf Page 6 of 10 05/17/2020 upon evaluation today patient appears to be doing well with regard to her leg ulcer. She has been tolerating the dressing changes without complication. Fortunately there is no signs of active infection which is great news and overall very pleased with where things stand today. No fevers, chills, nausea, vomiting, or diarrhea. 05/24/2020 upon evaluation today patient appears to be doing well with regard to her wound. Overall I feel like she is making excellent progress. There does not appear to be any signs of active infection which is great news. 05/31/2020 upon evaluation today patient appears to be doing well with regard to her wound. There does not appear to be any signs of active infection which is great news overall I am extremely pleased with  where things stand today. READMISSION 01/23/2022 She returns to clinic today with a new wound on her right posterior calf. She says that she was cleaning out an old  shed near the middle of August this year and then noticed what seemed to be a bug bite on her right posterior calf. It was itchy, red, and raised. By the end of September, an ulcer had developed. She has been applying various topical creams such as hydrocortisone and others to the site. When it was not improving, she made an appointment in the wound care center. ABI in clinic today was 0.97. On her right posterior calf, there is a circular wound with necrotic fat and black eschar present. There is no purulent drainage or malodor. The periwound skin is in good condition with just a little induration that appears to be secondary to inflammation. 01/31/2022: The wound measures a little bit larger today, but overall is quite a bit cleaner. There is some undermining from 9:00 to 3:00. She is having some periwound itching and says that her wrap slid. 02/08/2022: Despite using an Unna boot first layer at the top of her wrap, it slid again and it looks like there is been some bruising at the wound site. The wound is about the same size in terms of dimensions. There is a fair amount of slough and nonviable tissue still present. Edema control is better than last week. 02/15/2022: The wound dimensions are about the same. There is less nonviable tissue present. The periwound erythema has improved. 02/22/2022: The wound has deteriorated over the past week. It is larger and the periwound is more edematous, erythematous, and indurated. It looks as though there has been more tissue breakdown with undermining present. She is having more pain. There is a foul odor coming from the wound. 02/26/2022: The wound looks quite a bit better today and the odor is gone. I still do not have her culture data back, but she seems to be responding well to the Augmentin. 03/06/2022: Her wound continues to improve. There is still some slough accumulation, but the periwound skin is less inflamed. Her culture returned with a polymicrobial  population including Pseudomonas and so levofloxacin was also prescribed. She did not understand why a second antibiotic was being added so she has not yet initiated this. 03/14/2022: The wound is about the same, to perhaps a little bit larger. There is a fair amount of slough accumulation on the surface, as well as some hypertrophic granulation tissue. She is still taking levofloxacin, but has completed taking Augmentin. She has her [employer] compound with her today. 12/14; the patient has a significant circular wound on the posterior right calf. She is using Keystone and silver alginate under 3 layer compression. Her ABIs were within normal limits at 0.97. This may have been traumatic or an insect bite at the start I reviewed these records. 04/04/2022: Since I last saw the wound, it has contracted considerably. There is a fairly thick layer of slough on the surface, as it has not been debrided since the last time I did it. Edema control is excellent and the periwound skin is in much better condition. 04/12/2022: No significant change in the wound dimensions. It is filling with granulation tissue. Still with slough accumulation on the surface. 04/19/2022: The wound is smaller this week. There is some slough accumulation on the surface. 04/26/2022: The wound is smaller again this week and significantly cleaner. The wound surface is a little bit drier than ideal. 05/03/2022: The wound  measurements were about the same, but visually it appears smaller. There is still some undermining at the top of the wound. Moisture balance is better this week. 05/10/2022: The wound measured smaller today. There is still a fair amount of undermining present. Slough has built up on the surface. We are still awaiting snap VAC approval. 05/17/2022: The wound is a little bit smaller today. There is less slough on the surface, but the granulation tissue still is not very robust. She has been approved for snap VAC but will have a 20%  coinsurance and it is not clear what that amount would end up being for her. 05/27/2022: The surface of the wound has deteriorated and it is gray and fibrotic. No significant odor, but the drainage on her dressing was a little bit purulent. 05/31/2022: The wound looks quite a bit better today. It is still a little fibrotic but no longer has purulent-looking drainage. The color is better. She spoke with her insurance company and is interested in trying the snap VAC, now that she is aware of the cost to her. 06/07/2022: After 1 week in the snap VAC, there has been substantial improvement to her wound. The undermined portion is closing in. The surface has a healthier color and appearance. There is very minimal slough accumulation. Patient History Information obtained from Patient. Family History Cancer - Mother, Diabetes - Father, Hypertension - Mother, Kidney Disease - Mother, Lung Disease - Father, Thyroid Problems - Paternal Grandparents, No family history of Heart Disease, Seizures, Stroke, Tuberculosis. Social History Never smoker, Marital Status - Married, Alcohol Use - Never, Drug Use - No History, Caffeine Use - Daily - coffee. Medical History Eyes Denies history of Cataracts, Glaucoma, Optic Neuritis Ear/Nose/Mouth/Throat Denies history of Chronic sinus problems/congestion, Middle ear problems Hematologic/Lymphatic Patient has history of Anemia - iron Denies history of Hemophilia, Human Immunodeficiency Virus, Lymphedema, Sickle Cell Disease Respiratory Patient has history of Sleep Apnea - CPAP Denies history of Aspiration, Asthma, Chronic Obstructive Pulmonary Disease (COPD), Pneumothorax, Tuberculosis Cardiovascular Patient has history of Hypertension, Peripheral Venous Disease MAKALEE, [name] ([id number]) [id number] Page 7 of 10 Denies history of Angina, Arrhythmia, Congestive Heart Failure, Coronary Artery Disease, Deep Vein Thrombosis, Hypotension,  Myocardial Infarction, Peripheral Arterial Disease, Phlebitis, Vasculitis Gastrointestinal Denies history of Cirrhosis , Colitis, Crohnoos, Hepatitis A, Hepatitis B, Hepatitis C Endocrine Denies history of Type I Diabetes, Type II Diabetes Genitourinary Denies history of End Stage Renal Disease Immunological Denies history of Lupus Erythematosus, Raynaudoos, Scleroderma Integumentary (Skin) Denies history of History of Burn Musculoskeletal Denies history of Gout, Rheumatoid Arthritis, Osteoarthritis, Osteomyelitis Neurologic Denies history of Dementia, Neuropathy, Quadriplegia, Paraplegia, Seizure Disorder Oncologic Denies history of Received Chemotherapy, Received Radiation Psychiatric Denies history of Anorexia/bulimia, Confinement Anxiety Hospitalization/Surgery History - cholecystectomy 1980s. - nephrolithasis 1980s. - plate and rod in right elbow surgery 2010. 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 Vitals Time Taken: 2:50 PM, Height: 61 in, Weight: 277 lbs, BMI: 52.3, Temperature: 98.3 F, Pulse: 91 bpm, Respiratory Rate: 18 breaths/min, Blood Pressure: 135/81 mmHg. Respiratory Normal work of breathing on room air. General Notes: 06/07/2022: After 1 week in the snap VAC, there has been substantial improvement to her wound. The undermined portion is closing in. The surface has a healthier color and appearance. There is very minimal slough accumulation. Integumentary (Hair, Skin) Wound #2 status is Open. Original cause of wound was Insect Bite. The date acquired was:  11/21/2021. The wound has been in treatment 19 weeks. The wound is located on the Right,Posterior Lower Leg. The wound measures 2cm length x 2cm width x 0.3cm depth; 3.142cm^2 area and 0.942cm^3 volume. There is Fat Layer (Subcutaneous Tissue) exposed. There is no tunneling or undermining  noted. There is a medium amount of purulent drainage noted. The wound margin is distinct with the outline attached to the wound base. There is medium (34-66%) red, friable granulation within the wound bed. There is a medium (34-66%) amount of necrotic tissue within the wound bed including Adherent Slough. The periwound skin appearance had no abnormalities noted for moisture. The periwound skin appearance exhibited: Hemosiderin Staining. The periwound skin appearance did not exhibit: Excoriation, Scarring, Atrophie Blanche, Ecchymosis, Rubor, Erythema. Periwound temperature was noted as No Abnormality. Assessment Active Problems ICD-10 Non-pressure chronic ulcer of right calf with fat layer exposed Morbid (severe) obesity due to excess calories Essential (primary) hypertension Localized edema Procedures Wound #2 Pre-procedure diagnosis of Wound #2 is a Venous Leg Ulcer located on the Right,Posterior Lower Leg .Severity of Tissue Pre Debridement is: Fat layer exposed. There was a Selective/Open Wound Non-Viable Tissue Debridement with a total area of 4 sq cm performed by Fredirick Maudlin, MD. With the following instrument(s): Curette to remove Non-Viable tissue/material. Material removed includes [name] after achieving pain control using Lidocaine 5% topical [name], [name] ([id number]) [id number].pdf Page 8 of 10 ointment. No specimens were taken. A time out was conducted at 15:10, prior to the start of the procedure. A Minimum amount of bleeding was controlled with Pressure. The procedure was tolerated well. Post Debridement Measurements: 2cm length x 2cm width x 0.3cm depth; 0.942cm^3 volume. Character of Wound/Ulcer Post Debridement is improved. Severity of Tissue Post Debridement is: Fat layer exposed. Post procedure Diagnosis Wound #2: Same as Pre-Procedure General Notes: Scribed for Dr. Celine Ahr by [name], RN. Pre-procedure diagnosis of Wound #2 is a Venous  Leg Ulcer located on the Right,Posterior Lower Leg . There was a Three Layer Compression Therapy Procedure by [name], RN. Post procedure Diagnosis Wound #2: Same as Pre-Procedure Plan Follow-up Appointments: Return Appointment in 1 week. - Dr. Celine Ahr - Room 3 Friday 06/14/22 at 3:00pm Anesthetic: (In clinic) Topical Lidocaine 4% applied to wound bed Bathing/ Shower/ Hygiene: May shower with protection but do not get wound dressing(s) wet. Protect dressing(s) with water repellant cover (for example, large plastic bag) or a cast cover and may then take shower. Edema Control - Lymphedema / SCD / Other: Avoid standing for long periods of time. Exercise regularly Moisturize legs daily. Compression stocking or Garment 20-30 mm/Hg pressure to: - left leg daily. Apply first thing in the morning, remove at night. WOUND #2: - Lower Leg Wound Laterality: Right, Posterior Cleanser: Soap and Water 1 x Per Week/30 Days Discharge Instructions: May shower and wash wound with dial antibacterial soap and water prior to dressing change. Cleanser: Wound Cleanser 1 x Per Week/30 Days Discharge Instructions: Cleanse the wound with wound cleanser prior to applying a clean dressing using gauze sponges, not tissue or cotton balls. Peri-Wound Care: Sween Lotion (Moisturizing lotion) 1 x Per Week/30 Days Discharge Instructions: Apply moisturizing lotion as directed Prim Dressing: SNAP VAC 1 x Per Week/30 Days ary Secondary Dressing: Woven Gauze Sponge, Non-Sterile 4x4 in 1 x Per Week/30 Days Discharge Instructions: Apply over primary dressing as directed. Com pression Wrap: ThreePress (3 layer compression wrap) 1 x Per Week/30 Days Discharge Instructions: Apply three layer compression as directed. Com pression Wrap:  Unnaboot w/Calamine, 4x10 (in/yd) 1 x Per Week/30 Days Discharge Instructions: Apply Unnaboot at top of leg 06/07/2022: After 1 week in the snap VAC, there has been substantial improvement to her  wound. The undermined portion is closing in. The surface has a healthier color and appearance. There is very minimal slough accumulation. I used a curette to debride the slough from the wound. We will continue to use her Keystone topical antibiotic compound and the snap VAC negative pressure wound therapy device. She will follow-up in 1 week. Electronic Signature(s) Signed: 06/07/2022 3:34:52 PM By: Fredirick Maudlin MD FACS Entered By: Fredirick Maudlin on 06/07/2022 15:34:52 -------------------------------------------------------------------------------- HxROS Details Patient Name: Date of Service: [name], [name] 06/07/2022 2:45 PM Medical Record Number: [id number] Patient Account Number: [id number] Date of Birth/Sex: Treating RN: 1961-01-10 (62 y.o. F) Primary Care Provider: Loralyn Freshwater Other Clinician: Referring Provider: Treating Provider/Extender: Chesley Noon in Treatment: 19 Information Obtained From Patient Eyes Medical History: Negative for: Cataracts; Glaucoma; Optic Neuritis Ear/Nose/Mouth/Throat Medical History: Negative for: Chronic sinus problems/congestion; Middle ear problems ANAALICIA, MARTINSEN ([id number]) [phone].pdf Page 9 of 10 Hematologic/Lymphatic Medical History: Positive for: Anemia - iron Negative for: Hemophilia; Human Immunodeficiency Virus; Lymphedema; Sickle Cell Disease Respiratory Medical History: Positive for: Sleep Apnea - CPAP Negative for: Aspiration; Asthma; Chronic Obstructive Pulmonary Disease (COPD); Pneumothorax; Tuberculosis Cardiovascular Medical History: Positive for: Hypertension; Peripheral Venous Disease Negative for: Angina; Arrhythmia; Congestive Heart Failure; Coronary Artery Disease; Deep Vein Thrombosis; Hypotension; Myocardial Infarction; Peripheral Arterial Disease; Phlebitis; Vasculitis Gastrointestinal Medical History: Negative for: Cirrhosis ; Colitis; Crohns;  Hepatitis A; Hepatitis B; Hepatitis C Endocrine Medical History: Negative for: Type I Diabetes; Type II Diabetes Genitourinary Medical History: Negative for: End Stage Renal Disease Past Medical History Notes: years ago kidney stones Immunological Medical History: Negative for: Lupus Erythematosus; Raynauds; Scleroderma Integumentary (Skin) Medical History: Negative for: History of Burn Musculoskeletal Medical History: Negative for: Gout; Rheumatoid Arthritis; Osteoarthritis; Osteomyelitis Neurologic Medical History: Negative for: Dementia; Neuropathy; Quadriplegia; Paraplegia; Seizure Disorder Oncologic Medical History: Negative for: Received Chemotherapy; Received Radiation Past Medical History Notes: skin Ca removed from back years ago Psychiatric Medical History: Negative for: Anorexia/bulimia; Confinement Anxiety Immunizations Pneumococcal Vaccine: Received Pneumococcal Vaccination: No Implantable Devices None Hospitalization / Surgery History ROSANN, TRUEBA ([id number]) [id number] Page 10 of 10 Type of Hospitalization/Surgery cholecystectomy 1980s nephrolithasis 1980s plate and rod in right elbow surgery 2010 Family and Social History Cancer: Yes - Mother; Diabetes: Yes - Father; Heart Disease: No; Hypertension: Yes - Mother; Kidney Disease: Yes - Mother; Lung Disease: Yes - Father; Seizures: No; Stroke: No; Thyroid Problems: Yes - Paternal Grandparents; Tuberculosis: No; Never smoker; Marital Status - Married; Alcohol Use: Never; Drug Use: No History; Caffeine Use: Daily - coffee; Financial Concerns: No; Food, Clothing or Shelter Needs: No; Support System Lacking: No; Transportation Concerns: No Electronic Signature(s) Signed: 06/07/2022 3:35:49 PM By: Fredirick Maudlin MD FACS Entered By: Fredirick Maudlin on 06/07/2022 15:33:37 -------------------------------------------------------------------------------- SuperBill  Details Patient Name: Date of Service: [name], [name] 06/07/2022 Medical Record Number: [id number] Patient Account Number: [id number] Date of Birth/Sex: Treating RN: Jul 30, 1960 (62 y.o. F) Primary Care Provider: Loralyn Freshwater Other Clinician: Referring Provider: Treating Provider/Extender: Chesley Noon in Treatment: 19 Diagnosis Coding ICD-10 Codes Code Description [phone] Non-pressure chronic ulcer of right calf with fat layer exposed E66.01 Morbid (severe) obesity due to excess calories I10 Essential (primary) hypertension R60.0 Localized edema Facility Procedures : CPT4 Code: [id number] Description: [id number] - DEBRIDE WOUND 1ST 20 SQ  CM OR < ICD-10 Diagnosis Description L97.212 Non-pressure chronic ulcer of right calf with fat layer exposed Modifier: Quantity: 1 : CPT4 Code: [id number] Description: [id number] - WOUND VAC-50 SQ CM OR LESS Modifier: Quantity: 1 Physician Procedures : CPT4 Code Description Modifier [id number] - WC PHYS LEVEL 4 - EST PT 25 ICD-10 Diagnosis Description [id number] Non-pressure chronic ulcer of right calf with fat layer exposed R60.0 Localized edema E66.01 Morbid (severe) obesity due to excess calories  I10 Essential (primary) hypertension Quantity: 1 : [id number] - WC PHYS DEBR WO ANESTH 20 SQ CM ICD-10 Diagnosis Description L97.212 Non-pressure chronic ulcer of right calf with fat layer exposed Quantity: 1 Electronic Signature(s) Signed: 06/07/2022 3:35:24 PM By: Fredirick Maudlin MD FACS Entered By: Fredirick Maudlin on 06/07/2022 15:35:23

## 2022-06-10 NOTE — Progress Notes (Signed)
[name] ([id number]) [id number] Page 1 of 8 Visit Report for 06/07/2022 Arrival Information Details Patient Name: Date of Service: [name], [name] 06/07/2022 2:45 PM Medical Record Number: [id number] Patient Account Number: [id number] Date of Birth/Sex: Treating RN: [birth year] (62 y.o. [name], [name] Primary Care [name]: [name] Other Clinician: Referring [name]: Treating [name]/Extender: [name] in Treatment: 23 Visit Information History Since Last Visit Added or deleted any medications: No Patient Arrived: Ambulatory Any new allergies or adverse reactions: No Arrival Time: 14:54 Had a fall or experienced change in No Accompanied By: self activities of daily living that may affect Transfer Assistance: None risk of falls: Patient Identification Verified: Yes Signs or symptoms of abuse/neglect since last visito No Secondary Verification Process Completed: Yes Hospitalized since last visit: No Patient Requires Transmission-Based Precautions: No Implantable device outside of the clinic excluding No Patient Has Alerts: No cellular tissue based products placed in the center since last visit: Has Compression in Place as Prescribed: Yes Pain Present Now: No Electronic Signature(s) Signed: 06/07/2022 4:09:05 PM By: [name] East RN Entered By: [name] on 06/07/2022 16:09:04 -------------------------------------------------------------------------------- Compression Therapy Details Patient Name: Date of Service: [name], [name] H. 06/07/2022 2:45 PM Medical Record Number: [id number] Patient Account Number: [id number] Date of Birth/Sex: Treating RN: 05-20-1960 (62 y.o. [name] Primary Care Denorris Reust: [name] Other Clinician: Referring [name]: Treating [name]/Extender: [name] in Treatment: 19 Compression Therapy Performed for Wound  Assessment: Wound #2 Right,Posterior Lower Leg Performed By: Clinician [name] East, RN Compression Type: Three Layer Post Procedure Diagnosis Same as Pre-procedure Electronic Signature(s) Signed: 06/07/2022 3:31:23 PM By: [name] East RN Entered By: [name] on 06/07/2022 15:31:23 -------------------------------------------------------------------------------- Encounter Discharge Information Details Patient Name: Date of Service: [name], [name] H. 06/07/2022 2:45 PM Medical Record Number: [id number] Patient Account Number: [id number] Date of Birth/Sex: Treating RN: 1960/11/27 (62 y.o. [name] Primary Care Jahmil Macleod: [name] Other Clinician: Referring Murtaza Shell: Treating [name]/Extender: [name] in Treatment: 19 Encounter Discharge Information Items Post Procedure Vitals Discharge Condition: Stable Temperature (F): 98.3 Ambulatory Status: Ambulatory Pulse (bpm): 91 Discharge Destination: Home Respiratory Rate (breaths/min): 18 Transportation: Private Auto Blood Pressure (mmHg): 135/81 Accompanied By: self [name] ([id number].pdf Page 2 of 8 Schedule Follow-up Appointment: Yes Clinical Summary of Care: Electronic Signature(s) Signed: 06/07/2022 3:30:22 PM By: [name] East RN Entered By: [name] on 06/07/2022 15:30:22 -------------------------------------------------------------------------------- Lower Extremity Assessment Details Patient Name: Date of Service: [name], [name] 06/07/2022 2:45 PM Medical Record Number: [id number] Patient Account Number: [id number] Date of Birth/Sex: Treating RN: Dec 30, 1960 (62 y.o. [name] Primary Care Rudie Rikard: [name] Other Clinician: Referring [name]: Treating [name]/Extender: [name] in Treatment: 19 Edema Assessment Assessed: [name]: No] [Right: No] Edema: [Left: Ye] [Right:  s] Calf Left: Right: Point of Measurement: From Medial Instep 46.4 cm Ankle Left: Right: Point of Measurement: From Medial Instep 23.5 cm Vascular Assessment Pulses: Dorsalis Pedis Palpable: [Right:Yes] Electronic Signature(s) Signed: 06/07/2022 4:09:38 PM By: [name] East RN Entered By: [name] on 06/07/2022 14:55:22 -------------------------------------------------------------------------------- Multi Wound Chart Details Patient Name: Date of Service: [name], [name] Curb [name] H. 06/07/2022 2:45 PM Medical Record Number: [id number] Patient Account Number: [id number] Date of Birth/Sex: Treating RN: [birth year] (62 y.o. F) Primary Care [name]: [name] Other Clinician: Referring [name]: Treating [name]/Extender: [name] in Treatment: 19 Vital Signs Height(in): 61 Pulse(bpm): 91 Weight(lbs): [id number] Blood Pressure(mmHg):  135/81 Body Mass Index(BMI): 52.3 Temperature(F): 98.3 Respiratory Rate(breaths/min): 18 [2:Photos:] [N/A:N/A] Right, Posterior Lower Leg N/A N/A Wound Location: Insect Bite N/A N/A Wounding Event: Venous Leg Ulcer N/A N/A Primary Etiology: Anemia, Sleep Apnea, Hypertension, N/A N/A Comorbid History: Peripheral Venous Disease 11/21/2021 N/A N/A Date Acquired: 70 N/A N/A Weeks of Treatment: Open N/A N/A Wound Status: No N/A N/A Wound Recurrence: 2x2x0.3 N/A N/A Measurements L x W x D (cm) 3.142 N/A N/A A (cm) : rea 0.942 N/A N/A Volume (cm) : -852.10% N/A N/A % Reduction in A rea: -851.50% N/A N/A % Reduction in Volume: Full Thickness Without Exposed N/A N/A Classification: Support Structures Medium N/A N/A Exudate A mount: Purulent N/A N/A Exudate Type: yellow, brown, green N/A N/A Exudate Color: Distinct, outline attached N/A N/A Wound Margin: Medium (34-66%) N/A N/A Granulation A mount: Red, Friable N/A N/A Granulation Quality: Medium (34-66%) N/A N/A Necrotic A mount: Fat Layer  (Subcutaneous Tissue): Yes N/A N/A Exposed Structures: Fascia: No Tendon: No Muscle: No Joint: No Bone: No Small (1-33%) N/A N/A Epithelialization: Debridement - Selective/Open Wound N/A N/A Debridement: Pre-procedure Verification/Time Out 15:10 N/A N/A Taken: Lidocaine 5% topical ointment N/A N/A Pain Control: Slough N/A N/A Tissue Debrided: Non-Viable Tissue N/A N/A Level: 4 N/A N/A Debridement A (sq cm): rea Curette N/A N/A Instrument: Minimum N/A N/A Bleeding: Pressure N/A N/A Hemostasis A chieved: Procedure was tolerated well N/A N/A Debridement Treatment Response: 2x2x0.3 N/A N/A Post Debridement Measurements L x W x D (cm) 0.942 N/A N/A Post Debridement Volume: (cm) Excoriation: No N/A N/A Periwound Skin Texture: Scarring: No No Abnormalities Noted N/A N/A Periwound Skin Moisture: Hemosiderin Staining: Yes N/A N/A Periwound Skin Color: Atrophie [name]: No Ecchymosis: No Erythema: No Rubor: No No Abnormality N/A N/A Temperature: Compression Therapy N/A N/A Procedures Performed: Debridement Negative Pressure Wound Therapy Maintenance (NPWT) Treatment Notes Wound #2 (Lower Leg) Wound Laterality: Right, Posterior Cleanser Soap and Water Discharge Instruction: May shower and wash wound with dial antibacterial soap and water prior to dressing change. Wound Cleanser Discharge Instruction: Cleanse the wound with wound cleanser prior to applying a clean dressing using gauze sponges, not tissue or cotton balls. Peri-Wound Care Sween Lotion (Moisturizing lotion) Discharge Instruction: Apply moisturizing lotion as directed Topical Primary Dressing SNAP VAC Secondary Dressing Woven Gauze Sponge, Non-Sterile 4x4 in Discharge Instruction: Apply over primary dressing as directed. Secured With KINGSLEE, OGIER ([id number]) [id number] Page 4 of 8 Compression Wrap ThreePress (3 layer compression wrap) Discharge Instruction:  Apply three layer compression as directed. Unnaboot w/Calamine, 4x10 (in/yd) Discharge Instruction: Apply Unnaboot at top of leg Compression Stockings Add-Ons Electronic Signature(s) Signed: 06/07/2022 3:31:44 PM By: Fredirick Maudlin MD FACS Entered By: Fredirick Maudlin on 06/07/2022 15:31:44 -------------------------------------------------------------------------------- Multi-Disciplinary Care Plan Details Patient Name: Date of Service: [name] [name] H. 06/07/2022 2:45 PM Medical Record Number: [id number] Patient Account Number: [id number] Date of Birth/Sex: Treating RN: 08/24/1960 (62 y.o. [name] Primary Care Cybele Maule: [name] Other Clinician: Referring Genesi Stefanko: Treating Cabrina Shiroma/Extender: [name] in Treatment: [place] reviewed with physician Active Inactive Necrotic Tissue Nursing Diagnoses: Impaired tissue integrity related to necrotic/devitalized tissue Knowledge deficit related to management of necrotic/devitalized tissue Goals: Necrotic/devitalized tissue will be minimized in the wound bed Date Initiated: 01/23/2022 Target Resolution Date: 07/05/2022 Goal Status: Active Patient/caregiver will verbalize understanding of reason and process for debridement of necrotic tissue Date Initiated: 01/23/2022 Target Resolution Date: 07/05/2022 Goal Status: Active Interventions: Assess patient pain level pre-, during and post  procedure and prior to discharge Provide education on necrotic tissue and debridement process Treatment Activities: Apply topical anesthetic as ordered : 01/23/2022 Notes: Wound/Skin Impairment Nursing Diagnoses: Impaired tissue integrity Knowledge deficit related to ulceration/compromised skin integrity Goals: Patient/caregiver will verbalize understanding of skin care regimen Date Initiated: 01/23/2022 Target Resolution Date: 07/05/2022 Goal Status: Active Interventions: Assess  patient/caregiver ability to obtain necessary supplies Assess ulceration(s) every visit Treatment Activities: Skin care regimen initiated : 01/23/2022 Topical wound management initiated : 01/23/2022 Notes: [name], [name] ([id number]) [id number].pdf Page 5 of 8 Electronic Signature(s) Signed: 06/07/2022 3:07:49 PM By: [name] East RN Entered By: [name] on 06/07/2022 15:07:48 -------------------------------------------------------------------------------- Negative Pressure Wound Therapy Maintenance (NPWT) Details Patient Name: Date of Service: [name] 06/07/2022 2:45 PM Medical Record Number: [id number] Patient Account Number: [id number] Date of Birth/Sex: Treating RN: 03-Jun-1960 (62 y.o. [name] Primary Care Reinhart Saulters: [name] Other Clinician: Referring Rashay Barnette: Treating Manasa Spease/Extender: [name] in Treatment: 19 NPWT Maintenance Performed for: Wound #2 Right, Posterior Lower Leg Performed By: [name] East, RN Type: Other Coverage Size (sq cm): 4 Pressure Type: Constant Pressure Setting: 125 mmHG Drain Type: None Primary Contact: None Sponge/Dressing Type: Foam- Blue Date Initiated: 05/31/2022 Quantity of Sponges/Gauze Removed: 1 Canister Exudate Volume: 10 Dressing Reapplied: Yes Quantity of Sponges/Gauze Inserted: 1 Days On NPWT : 8 Post Procedure Diagnosis Same as Pre-procedure Electronic Signature(s) Signed: 06/07/2022 4:09:38 PM By: [name] East RN Entered By: [name] on 06/07/2022 15:14:09 -------------------------------------------------------------------------------- Pain Assessment Details Patient Name: Date of Service: [name], [name] H. 06/07/2022 2:45 PM Medical Record Number: [id number] Patient Account Number: [id number] Date of Birth/Sex: Treating RN: 1961/01/25 (62 y.o. [name] Primary Care Whitnee Orzel: [name] Other Clinician: Referring  Tyheim Vanalstyne: Treating Irene Mitcham/Extender: [name] in Treatment: 19 Active Problems Location of Pain Severity and Description of Pain Patient Has Paino No Site Locations Rate the pain. Current Pain Level: 0 [name], [name] ([id number]) [phone].pdf Page 6 of 8 Pain Management and Medication Current Pain Management: Electronic Signature(s) Signed: 06/07/2022 4:09:38 PM By: [name] East RN Entered By: [name] on 06/07/2022 14:54:48 -------------------------------------------------------------------------------- Patient/Caregiver Education Details Patient Name: Date of Service: [name], [name] Curb [name] Lemmie Evens 3/1/2024andnbsp2:45 PM Medical Record Number: [id number] Patient Account Number: [id number] Date of Birth/Gender: Treating RN: 1960/05/03 (62 y.o. [name] Primary Care Physician: [name] Other Clinician: Referring Physician: Treating Physician/Extender: [name] in Treatment: 19 Education Assessment Education Provided To: Patient Education Topics Provided Wound Debridement: Methods: Explain/Verbal Responses: Reinforcements needed, State content correctly Wound/Skin Impairment: Methods: Explain/Verbal Responses: Reinforcements needed, State content correctly Electronic Signature(s) Signed: 06/07/2022 4:09:38 PM By: [name] East RN Entered By: [name] on 06/07/2022 15:08:05 -------------------------------------------------------------------------------- Wound Assessment Details Patient Name: Date of Service: [name], [name] H. 06/07/2022 2:45 PM Medical Record Number: [id number] Patient Account Number: [id number] Date of Birth/Sex: Treating RN: 25-Aug-1960 (62 y.o. [name] Primary Care Ivana Nicastro: [name] Other Clinician: Referring Serra Younan: Treating Kellin Fifer/Extender: [name] in Treatment: 19 Wound Status Wound Number: 2  Primary Venous Leg Ulcer Etiology: Wound Location: Right, Posterior Lower Leg Wound Status: Open Wounding Event: Insect Bite Comorbid Anemia, Sleep Apnea, Hypertension, Peripheral Venous Date Acquired: 11/21/2021 History: Disease Weeks Of Treatment: 19 Clustered Wound: No Photos [name], [name] ([id number]) [phone].pdf Page 7 of 8 Wound Measurements Length: (cm) 2 Width: (cm) 2 Depth: (cm) 0.3 Area: (cm) 3.142 Volume: (cm) 0.942 % Reduction in Area: -852.1% % Reduction  in Volume: -851.5% Epithelialization: Small (1-33%) Tunneling: No Undermining: No Wound Description Classification: Full Thickness Without Exposed Support Structures Wound Margin: Distinct, outline attached Exudate Amount: Medium Exudate Type: Purulent Exudate Color: yellow, brown, green Foul Odor After Cleansing: No Slough/Fibrino Yes Wound Bed Granulation Amount: Medium (34-66%) Exposed Structure Granulation Quality: Red, Friable Fascia Exposed: No Necrotic Amount: Medium (34-66%) Fat Layer (Subcutaneous Tissue) Exposed: Yes Necrotic Quality: Adherent Slough Tendon Exposed: No Muscle Exposed: No Joint Exposed: No Bone Exposed: No Periwound Skin Texture Texture Color No Abnormalities Noted: No No Abnormalities Noted: No Excoriation: No Atrophie [name]: No Scarring: No Ecchymosis: No Erythema: No Moisture Hemosiderin Staining: Yes No Abnormalities Noted: Yes Rubor: No Temperature / Pain Temperature: No Abnormality Treatment Notes Wound #2 (Lower Leg) Wound Laterality: Right, Posterior Cleanser Soap and Water Discharge Instruction: May shower and wash wound with dial antibacterial soap and water prior to dressing change. Wound Cleanser Discharge Instruction: Cleanse the wound with wound cleanser prior to applying a clean dressing using gauze sponges, not tissue or cotton balls. Peri-Wound Care Sween Lotion (Moisturizing lotion) Discharge Instruction: Apply  moisturizing lotion as directed Topical Primary Dressing SNAP VAC Secondary Dressing Woven Gauze Sponge, Non-Sterile 4x4 in Discharge Instruction: Apply over primary dressing as directed. Secured With Compression Wrap ThreePress (3 layer compression wrap) Discharge Instruction: Apply three layer compression as directed. [name], [name] ([id number]) [id number] Page 8 of 8 Unnaboot w/Calamine, 4x10 (in/yd) Discharge Instruction: Apply Unnaboot at top of leg Compression Stockings Add-Ons Electronic Signature(s) Signed: 06/07/2022 4:09:38 PM By: [name] East RN Signed: 06/07/2022 4:13:54 PM By: Dellie Catholic RN Entered By: Dellie Catholic on 06/07/2022 14:58:16 -------------------------------------------------------------------------------- [name] Details Patient Name: Date of Service: [name], [name] H. 06/07/2022 2:45 PM Medical Record Number: [id number] Patient Account Number: [id number] Date of Birth/Sex: Treating RN: 16-Mar-1961 (62 y.o. [name], [name] Primary Care Cairo Lingenfelter: [name] Other Clinician: Referring Brianne Maina: Treating Veverly Larimer/Extender: [name] in Treatment: 19 Vital Signs Time Taken: 14:50 Temperature (F): 98.3 Height (in): 61 Pulse (bpm): 91 Weight (lbs): 277 Respiratory Rate (breaths/min): 18 Body Mass Index (BMI): 52.3 Blood Pressure (mmHg): 135/81 Reference Range: 80 - 120 mg / dl Electronic Signature(s) Signed: 06/07/2022 3:05:34 PM By: [name] East RN Entered By: [name] on 06/07/2022 15:05:33

## 2022-06-14 ENCOUNTER — Encounter (HOSPITAL_BASED_OUTPATIENT_CLINIC_OR_DEPARTMENT_OTHER): Payer: BC Managed Care – PPO | Admitting: General Surgery

## 2022-06-14 DIAGNOSIS — L97212 Non-pressure chronic ulcer of right calf with fat layer exposed: Secondary | ICD-10-CM | POA: Diagnosis not present

## 2022-06-16 NOTE — Progress Notes (Signed)
[name], [name] ([id number]) [id number] Page 1 of 8 Visit Report for 06/14/2022 Arrival Information Details Patient Name: Date of Service: [place], [name] 06/14/2022 3:00 PM Medical Record Number: [id number] Patient Account Number: [id number] Date of Birth/Sex: Treating RN: 14-Mar-1961 (62 y.o. [name], [name] Primary Care Iyauna Sing: Loralyn Freshwater Other Clinician: Referring Tri Chittick: Treating Jatniel Verastegui/Extender: Chesley Noon in Treatment: 20 Visit Information History Since Last Visit Added or deleted any medications: No Patient Arrived: Ambulatory Any new allergies or adverse reactions: No Arrival Time: 14:55 Had a fall or experienced change in No Accompanied By: self activities of daily living that may affect Transfer Assistance: None risk of falls: Patient Identification Verified: Yes Signs or symptoms of abuse/neglect since last visito No Secondary Verification Process Completed: Yes Hospitalized since last visit: No Patient Requires Transmission-Based Precautions: No Implantable device outside of the clinic excluding No Patient Has Alerts: No cellular tissue based products placed in the center since last visit: Has Compression in Place as Prescribed: Yes Pain Present Now: Yes Electronic Signature(s) Signed: 06/14/2022 4:25:11 PM By: [name] RN Entered By: [name] on 06/14/2022 14:55:24 -------------------------------------------------------------------------------- Encounter Discharge Information Details Patient Name: Date of Service: [name], [name] 06/14/2022 3:00 PM Medical Record Number: [id number] Patient Account Number: [id number] Date of Birth/Sex: Treating RN: 1960/04/24 (62 y.o. [name] Primary Care Adri Schloss: Loralyn Freshwater Other Clinician: Referring Areonna Bran: Treating Escher Harr/Extender: Chesley Noon in Treatment: 20 Encounter Discharge Information Items  Post Procedure Vitals Discharge Condition: Stable Unable to obtain vitals Reason: pt refused Ambulatory Status: Ambulatory Discharge Destination: Home Transportation: Private Auto Accompanied By: self Schedule Follow-up Appointment: Yes Clinical Summary of Care: Electronic Signature(s) Signed: 06/14/2022 3:50:16 PM By: [name] RN Entered By: [name] on 06/14/2022 15:50:16 -------------------------------------------------------------------------------- Lower Extremity Assessment Details Patient Name: Date of Service: [place], [name] 06/14/2022 3:00 PM Medical Record Number: [id number] Patient Account Number: [id number] Date of Birth/Sex: Treating RN: 1960-11-01 (62 y.o. [name] Primary Care Dorthea Maina: Loralyn Freshwater Other Clinician: Referring Pooja Camuso: Treating Cloe Sockwell/Extender: Chesley Noon in Treatment: 20 Edema Assessment Assessed: [name]: No] [name]: No] W[LeftDALAYSHA, MARZILLI [id number] Page 2 of 8] Edema: [Left: Ye] [Right: s] Calf Left: Right: Point of Measurement: From Medial Instep 47.5 cm Ankle Left: Right: Point of Measurement: From Medial Instep 23 cm Vascular Assessment Pulses: Dorsalis Pedis Palpable: [Right:Yes] Electronic Signature(s) Signed: 06/14/2022 4:25:11 PM By: [name] RN Entered By: [name] on 06/14/2022 15:01:35 -------------------------------------------------------------------------------- Multi Wound Chart Details Patient Name: Date of Service: [name], [name] 06/14/2022 3:00 PM Medical Record Number: [id number] Patient Account Number: [id number] Date of Birth/Sex: Treating RN: 1960-05-02 (62 y.o. F) Primary Care Ivah Girardot: Loralyn Freshwater Other Clinician: Referring Lorre Opdahl: Treating Jazmine Heckman/Extender: Chesley Noon in Treatment: 20 [2:Photos:] [N/A:N/A] Right, Posterior Lower Leg N/A N/A Wound  Location: Insect Bite N/A N/A Wounding Event: Venous Leg Ulcer N/A N/A Primary Etiology: Anemia, Sleep Apnea, Hypertension, N/A N/A Comorbid History: Peripheral Venous Disease 11/21/2021 N/A N/A Date Acquired: 20 N/A N/A Weeks of Treatment: Open N/A N/A Wound Status: No N/A N/A Wound Recurrence: 2x2x0.3 N/A N/A Measurements L x W x D (cm) 3.142 N/A N/A A (cm) : rea 0.942 N/A N/A Volume (cm) : -852.10% N/A N/A % Reduction in A rea: -851.50% N/A N/A % Reduction in Volume: 2 Starting Position 1 (o'clock): 3 Ending Position 1 (o'clock): 0.3 Maximum Distance 1 (cm): Yes N/A N/A Undermining: Full Thickness  Without Exposed N/A N/A Classification: Support Structures Medium N/A N/A Exudate Amount: Purulent N/A N/A Exudate Type: yellow, brown, green N/A N/A Exudate Color: Distinct, outline attached N/A N/A Wound Margin: Large (67-100%) N/A N/A Granulation Amount: Red, Friable N/A N/A Granulation Quality: Small (1-33%) N/A N/A Necrotic Amount: Fat Layer (Subcutaneous Tissue): Yes N/A N/A Exposed Structures: Fascia: No Tendon: No Muscle: No Joint: No Bone: No Small (1-33%) N/A N/A EpithelializationTYNIQUA, GARNES ([id number].pdf Page 3 of 8 Debridement - Selective/Open Wound N/A N/A Debridement: 15:14 N/A N/A Pre-procedure Verification/Time Out Taken: Lidocaine 5% topical ointment N/A N/A Pain Control: Slough N/A N/A Tissue Debrided: Non-Viable Tissue N/A N/A Level: 4 N/A N/A Debridement A (sq cm): rea Curette N/A N/A Instrument: Minimum N/A N/A Bleeding: Pressure N/A N/A Hemostasis A chieved: Procedure was tolerated well N/A N/A Debridement Treatment Response: 2x2x0.3 N/A N/A Post Debridement Measurements L x W x D (cm) 0.942 N/A N/A Post Debridement Volume: (cm) Excoriation: No N/A N/A Periwound Skin Texture: Scarring: No No Abnormalities Noted N/A N/A Periwound Skin Moisture: Hemosiderin  Staining: Yes N/A N/A Periwound Skin Color: Atrophie Blanche: No Ecchymosis: No Erythema: No Rubor: No No Abnormality N/A N/A Temperature: Debridement N/A N/A Procedures Performed: Negative Pressure Wound Therapy Maintenance (NPWT) Treatment Notes Wound #2 (Lower Leg) Wound Laterality: Right, Posterior Cleanser Soap and Water Discharge Instruction: May shower and wash wound with dial antibacterial soap and water prior to dressing change. Wound Cleanser Discharge Instruction: Cleanse the wound with wound cleanser prior to applying a clean dressing using gauze sponges, not tissue or cotton balls. Peri-Wound Care Sween Lotion (Moisturizing lotion) Discharge Instruction: Apply moisturizing lotion as directed Topical Primary Dressing SNAP VAC Secondary Dressing Woven Gauze Sponge, Non-Sterile 4x4 in Discharge Instruction: Apply over primary dressing as directed. Secured With Compression Wrap ThreePress (3 layer compression wrap) Discharge Instruction: Apply three layer compression as directed. Unnaboot w/Calamine, 4x10 (in/yd) Discharge Instruction: Apply Unnaboot at top of leg Compression Stockings Add-Ons Electronic Signature(s) Signed: 06/14/2022 4:04:21 PM By: Fredirick Maudlin MD FACS Entered By: Fredirick Maudlin on 06/14/2022 16:04:21 -------------------------------------------------------------------------------- Multi-Disciplinary Care Plan Details Patient Name: Date of Service: [place], [name] 06/14/2022 3:00 PM Medical Record Number: [id number] Patient Account Number: [id number] Date of Birth/Sex: Treating RN: 05-Feb-1961 (62 y.o. [name] Primary Care Dearies Meikle: Loralyn Freshwater Other Clinician: Referring Aleana Fifita: Treating Jaimere Feutz/Extender: Chesley Noon in Treatment: [address] [place] ([id number]) [id number] Page 4 of 8 Multidisciplinary Care Plan reviewed with physician Active Inactive Necrotic  Tissue Nursing Diagnoses: Impaired tissue integrity related to necrotic/devitalized tissue Knowledge deficit related to management of necrotic/devitalized tissue Goals: Necrotic/devitalized tissue will be minimized in the wound bed Date Initiated: 01/23/2022 Target Resolution Date: 07/05/2022 Goal Status: Active Patient/caregiver will verbalize understanding of reason and process for debridement of necrotic tissue Date Initiated: 01/23/2022 Target Resolution Date: 07/05/2022 Goal Status: Active Interventions: Assess patient pain level pre-, during and post procedure and prior to discharge Provide education on necrotic tissue and debridement process Treatment Activities: Apply topical anesthetic as ordered : 01/23/2022 Notes: Wound/Skin Impairment Nursing Diagnoses: Impaired tissue integrity Knowledge deficit related to ulceration/compromised skin integrity Goals: Patient/caregiver will verbalize understanding of skin care regimen Date Initiated: 01/23/2022 Target Resolution Date: 07/05/2022 Goal Status: Active Interventions: Assess patient/caregiver ability to obtain necessary supplies Assess ulceration(s) every visit Treatment Activities: Skin care regimen initiated : 01/23/2022 Topical wound management initiated : 01/23/2022 Notes: Electronic Signature(s) Signed: 06/14/2022 3:45:39 PM By: [name] RN Entered By: [name] on 06/14/2022 15:45:38 --------------------------------------------------------------------------------  Negative Pressure Wound Therapy Maintenance (NPWT) Details Patient Name: Date of Service: [place], [name] 06/14/2022 3:00 PM Medical Record Number: [id number] Patient Account Number: [id number] Date of Birth/Sex: Treating RN: 06-17-1960 (62 y.o. [name] Primary Care Alyas Creary: Loralyn Freshwater Other Clinician: Referring Maryori Weide: Treating Grettel Rames/Extender: Chesley Noon in Treatment: 20 NPWT Maintenance  Performed for: Wound #2 Right, Posterior Lower Leg Performed By: [name], RN Type: Other Coverage Size (sq cm): 4 Pressure Type: Constant Pressure Setting: 125 mmHG Drain Type: None Primary Contact: None Sponge/Dressing Type: Foam- Blue Date Initiated: 05/31/2022 Dressing Removed: No DAFNY, VELTMAN ([id number]) [phone].pdf Page 5 of 8 Quantity of Sponges/Gauze Removed: 1 Canister Changed: No Canister Exudate Volume: 40 Dressing Reapplied: Yes Quantity of Sponges/Gauze Inserted: 1 Days On NPWT : 15 Post Procedure Diagnosis Same as Pre-procedure Electronic Signature(s) Signed: 06/14/2022 4:25:11 PM By: [name] RN Entered By: [name] on 06/14/2022 15:19:25 -------------------------------------------------------------------------------- Pain Assessment Details Patient Name: Date of Service: [name], [name] 06/14/2022 3:00 PM Medical Record Number: [id number] Patient Account Number: [id number] Date of Birth/Sex: Treating RN: 04/04/1961 (62 y.o. [name] Primary Care Aleshia Cartelli: Loralyn Freshwater Other Clinician: Referring Sadrac Zeoli: Treating Shantoria Ellwood/Extender: Chesley Noon in Treatment: 20 Active Problems Location of Pain Severity and Description of Pain Patient Has Paino Yes Site Locations Pain Location: Pain in Ulcers Rate the pain. Current Pain Level: 6 Character of Pain Describe the Pain: Burning Pain Management and Medication Current Pain Management: Electronic Signature(s) Signed: 06/14/2022 4:25:00 PM By: [name] RN Entered By: [name] on 06/14/2022 16:24:59 -------------------------------------------------------------------------------- Patient/Caregiver Education Details Patient Name: Date of Service: [name], [place] DA Lemmie Evens 3/8/2024andnbsp3:00 PM Medical Record Number: [id number] Patient Account Number: [id number] Date of Birth/Gender: Treating RN: 07-16-1960 (62 y.o. [name] Primary Care Physician: Loralyn Freshwater Other Clinician: Referring Physician: Treating Physician/Extender: Chesley Noon in Treatment: [address] AMARRAH, FAIN ([id number]) [id number] Page 6 of 8 Education Provided To: Patient Education Topics Provided Wound Debridement: Methods: Explain/Verbal Responses: Reinforcements needed, State content correctly Wound/Skin Impairment: Methods: Explain/Verbal Responses: Reinforcements needed, State content correctly Electronic Signature(s) Signed: 06/14/2022 4:25:11 PM By: [name] RN Entered By: [name] on 06/14/2022 15:46:15 -------------------------------------------------------------------------------- Wound Assessment Details Patient Name: Date of Service: [name], [name] 06/14/2022 3:00 PM Medical Record Number: [id number] Patient Account Number: [id number] Date of Birth/Sex: Treating RN: 1960-04-16 (62 y.o. [name], [place] Primary Care Dravyn Severs: Loralyn Freshwater Other Clinician: Referring Lilou Kneip: Treating Kalyn Dimattia/Extender: Chesley Noon in Treatment: 20 Wound Status Wound Number: 2 Primary Venous Leg Ulcer Etiology: Wound Location: Right, Posterior Lower Leg Wound Status: Open Wounding Event: Insect Bite Comorbid Anemia, Sleep Apnea, Hypertension, Peripheral Venous Date Acquired: 11/21/2021 History: Disease Weeks Of Treatment: 20 Clustered Wound: No Photos Wound Measurements Length: (cm) 2 Width: (cm) 2 Depth: (cm) 0.3 Area: (cm) 3.142 Volume: (cm) 0.942 % Reduction in Area: -852.1% % Reduction in Volume: -851.5% Epithelialization: Small (1-33%) Tunneling: No Undermining: Yes Starting Position (o'clock): 2 Ending Position (o'clock): 3 Maximum Distance: (cm) 0.3 Wound Description Classification: Full Thickness Without Exposed Support Structures Wound Margin: Distinct, outline  attached Exudate Amount: Medium Exudate Type: Purulent Exudate Color: yellow, brown, green Foul Odor After Cleansing: No Slough/Fibrino Yes Wound Bed ATHEA, HEILIGER ([id number] Page 7 of 8 Granulation Amount: Large (67-100%) Exposed Structure Granulation Quality: Red, Friable Fascia Exposed: No Necrotic Amount: Small (1-33%) Fat Layer (Subcutaneous Tissue) Exposed: Yes Necrotic Quality: Adherent Slough  Tendon Exposed: No Muscle Exposed: No Joint Exposed: No Bone Exposed: No Periwound Skin Texture Texture Color No Abnormalities Noted: No No Abnormalities Noted: No Excoriation: No Atrophie Blanche: No Scarring: No Ecchymosis: No Erythema: No Moisture Hemosiderin Staining: Yes No Abnormalities Noted: Yes Rubor: No Temperature / Pain Temperature: No Abnormality Treatment Notes Wound #2 (Lower Leg) Wound Laterality: Right, Posterior Cleanser Soap and Water Discharge Instruction: May shower and wash wound with dial antibacterial soap and water prior to dressing change. Wound Cleanser Discharge Instruction: Cleanse the wound with wound cleanser prior to applying a clean dressing using gauze sponges, not tissue or cotton balls. Peri-Wound Care Sween Lotion (Moisturizing lotion) Discharge Instruction: Apply moisturizing lotion as directed Topical Primary Dressing SNAP VAC Secondary Dressing Woven Gauze Sponge, Non-Sterile 4x4 in Discharge Instruction: Apply over primary dressing as directed. Secured With Compression Wrap ThreePress (3 layer compression wrap) Discharge Instruction: Apply three layer compression as directed. Unnaboot w/Calamine, 4x10 (in/yd) Discharge Instruction: Apply Unnaboot at top of leg Compression Stockings Add-Ons Electronic Signature(s) Signed: 06/14/2022 4:25:11 PM By: [name] RN Entered By: [name] on 06/14/2022  15:06:20 -------------------------------------------------------------------------------- Vitals Details Patient Name: Date of Service: [name], [name] 06/14/2022 3:00 PM Medical Record Number: [id number] Patient Account Number: [id number] Date of Birth/Sex: Treating RN: 1960-09-25 (62 y.o. [name] Primary Care Corissa Oguinn: Loralyn Freshwater Other Clinician: Referring Guillermina Shaft: Treating Jkwon Treptow/Extender: Chesley Noon in Treatment: 20 Vital Signs Time Taken: 14:55 Reference Range: 80 - 120 mg / dl Height (in): 61 Weight (lbs): [place], [place] ([id number]) [id number].pdf Page 8 of 8 Body Mass Index (BMI): 52.3 Electronic Signature(s) Signed: 06/14/2022 4:24:51 PM By: [name] RN Entered By: [name] on 06/14/2022 16:24:51

## 2022-06-16 NOTE — Progress Notes (Signed)
NIVEA, RIPPEL ([id number]) [id number] Page 1 of 10 Visit Report for 06/14/2022 Chief Complaint Document Details Patient Name: Date of Service: [name], [name] [name] H. 06/14/2022 3:00 PM Medical Record Number: [id number] Patient Account Number: [id number] Date of Birth/Sex: Treating RN: 1961-02-18 (62 y.o. F) Primary Care Provider: Loralyn [name] Other Clinician: Referring Provider: Treating Provider/Extender: [name] in Treatment: 20 Information Obtained from: Patient Chief Complaint RLE ulcer Electronic Signature(s) Signed: 06/14/2022 4:05:32 PM By: [name] Maudlin MD FACS Entered By: [name] on 06/14/2022 16:05:32 -------------------------------------------------------------------------------- Debridement Details Patient Name: Date of Service: [name], [name] [name] H. 06/14/2022 3:00 PM Medical Record Number: [id number] Patient Account Number: [id number] Date of Birth/Sex: Treating RN: 19-Nov-1960 (62 y.o. [name], [name] Primary Care Provider: Loralyn [name] Other Clinician: Referring Provider: Treating Provider/Extender: [name] in Treatment: 20 Debridement Performed for Assessment: Wound #2 Right,Posterior Lower Leg Performed By: Physician [name] Maudlin, MD Debridement Type: Debridement Severity of Tissue Pre Debridement: Fat layer exposed Level of Consciousness (Pre-procedure): Awake and Alert Pre-procedure Verification/Time Out Yes - 15:14 Taken: Start Time: 15:15 Pain Control: Lidocaine 5% topical ointment T Area Debrided (L x W): otal 2 (cm) x 2 (cm) = 4 (cm) Tissue and other material debrided: Non-Viable, Slough, Slough Level: Non-Viable Tissue Debridement Description: Selective/Open Wound Instrument: Curette Bleeding: Minimum Hemostasis Achieved: Pressure Response to Treatment: Procedure was tolerated well Level of Consciousness (Post- Awake and  Alert procedure): Post Debridement Measurements of Total Wound Length: (cm) 2 Width: (cm) 2 Depth: (cm) 0.3 Volume: (cm) 0.942 Character of Wound/Ulcer Post Debridement: Requires Further Debridement Severity of Tissue Post Debridement: Fat layer exposed Post Procedure Diagnosis Same as Pre-procedure Notes Scribed for Dr. Celine [name] by [name] East, RN Electronic Signature(s) Signed: 06/14/2022 4:24:33 PM By: [name] Maudlin MD FACS Signed: 06/14/2022 4:25:11 PM By: [name] East RN Entered By: [name] on 06/14/2022 15:16:43 [name] ([id number] Page 2 of 10 -------------------------------------------------------------------------------- HPI Details Patient Name: Date of Service: [name] 06/14/2022 3:00 PM Medical Record Number: [id number] Patient Account Number: [id number] Date of Birth/Sex: Treating RN: Sep 13, 1960 (62 y.o. F) Primary Care Provider: Loralyn [name] Other Clinician: Referring Provider: Treating Provider/Extender: [name] in Treatment: 20 History of Present Illness HPI Description: 02/16/2020 upon evaluation today patient actually appears to be doing poorly in regard to her left medial lower extremity ulcer. This is actually an area that she tells me she has had intermittent issues with over the years although has been closed for some time she typically uses compression right now she has juxta lite compression wraps. With that being said she tells me that this nonetheless open several weeks/months ago and has been given her trouble since. She does have a history of chronic venous insufficiency she is seeing specialist for this in the past she has had an ablation as well as sclerotherapy. With that being said she also has hypertension chronically which is managed by her primary care provider. In general she seems to be worsening overall with regard to the wound and states that  she finally realized that she needed to come in and have somebody look at this and not continue to try to manage this on her own. No fevers, chills, nausea, vomiting, or diarrhea. 02/23/2020 on evaluation today patient appears to be doing well with regard to her wound. This is showing some signs of improvement which is great news still were not quite at the point where I would like to  be as far as the overall appearance of the wound is concerned but I do believe this is better than last week. I do believe the Iodoflex is helping as well. 03/08/2020 upon evaluation today patient appears to be doing well with regard to her wound. She has been tolerating the dressing changes without complication. Fortunately I feel like she has made great progress with the Iodoflex but I feel like it may be the point rest to switch to something else possibly a collagen- based dressing at this time. 03/15/2020 upon evaluation today patient appears to be doing excellent in regard to her leg ulcer. She has been tolerating the dressing changes without complication. Fortunately there is no signs of active infection. Overall she is measuring a little bit smaller today which is great news. 03/22/2020 upon evaluation today patient appears to be doing well with regard to her wound. She has been tolerating the dressing changes without complication. Fortunately there is no signs of active infection at this time. 03/28/2020; patient I do not usually see however she has a wound on the left anterior lower leg secondary to chronic venous insufficiency we have been using silver collagen under compression. She arrives in clinic with a nonviable surface requiring debridement 04/12/2020 upon evaluation today patient appears to be doing well all things considered with regard to her leg ulcer. She is tolerating the dressing changes without complication there is minimal dry skin around the edges of the wound that may be trapping and stopping some  of the events of the new skin I am can work on that today. Otherwise the surface of the wound appears to be doing excellent. 04/19/2020 upon evaluation today patient appears to be doing well with regard to her leg ulcer. She has been tolerating dressing changes without complication. Fortunately there is no signs of active infection at this time. No fever chills noted overall very pleased with how things seem to be progressing. 04/26/2020 on evaluation today patient appears to be doing well with regard to her wound currently. Is showing signs of excellent improvement overall is filling in nicely and there does not appear to be any signs of infection. No fevers, chills, nausea, vomiting, or diarrhea. 05/03/2020 upon evaluation today patient appears to be doing well with regard to her wound on the leg. This overall showing signs of good improvement which is great she has some good epithelial growth and overall I think that things are moving in the correct direction. We likewise going to continue with the wound care measures as before since she seems to making such good improvement. 2/3; venous wound on the left medial leg. This is contracting. We are using Prisma and 3 layer compression. She has a stocking and waiting in the eventuality this heals. She is already using it on the right 05/17/2020 upon evaluation today patient appears to be doing well with regard to her leg ulcer. She has been tolerating the dressing changes without complication. Fortunately there is no signs of active infection which is great news and overall very pleased with where things stand today. No fevers, chills, nausea, vomiting, or diarrhea. 05/24/2020 upon evaluation today patient appears to be doing well with regard to her wound. Overall I feel like she is making excellent progress. There does not appear to be any signs of active infection which is great news. 05/31/2020 upon evaluation today patient appears to be doing well with  regard to her wound. There does not appear to be any signs of active infection  which is great news overall I am extremely pleased with where things stand today. READMISSION 01/23/2022 She returns to clinic today with a new wound on her right posterior calf. She says that she was cleaning out an old shed near the middle of August this year and then noticed what seemed to be a bug bite on her right posterior calf. It was itchy, red, and raised. By the end of September, an ulcer had developed. She has been applying various topical creams such as hydrocortisone and others to the site. When it was not improving, she made an appointment in the wound care center. ABI in clinic today was 0.97. On her right posterior calf, there is a circular wound with necrotic fat and black eschar present. There is no purulent drainage or malodor. The periwound skin is in good condition with just a little induration that appears to be secondary to inflammation. 01/31/2022: The wound measures a little bit larger today, but overall is quite a bit cleaner. There is some undermining from 9:00 to 3:00. She is having some periwound itching and says that her wrap slid. 02/08/2022: Despite using an Unna boot first layer at the top of her wrap, it slid again and it looks like there is been some bruising at the wound site. The wound is about the same size in terms of dimensions. There is a fair amount of slough and nonviable tissue still present. Edema control is better than last week. 02/15/2022: The wound dimensions are about the same. There is less nonviable tissue present. The periwound erythema has improved. 02/22/2022: The wound has deteriorated over the past week. It is larger and the periwound is more edematous, erythematous, and indurated. It looks as though there has been more tissue breakdown with undermining present. She is having more pain. There is a foul odor coming from the wound. 02/26/2022: The wound looks quite a  bit better today and the odor is gone. I still do not have her culture data back, but she seems to be responding well to the Augmentin. 03/06/2022: Her wound continues to improve. There is still some slough accumulation, but the periwound skin is less inflamed. Her culture returned with a DIAJAH, NERAD ([id number]) [id number] Page 3 of 10 polymicrobial population including Pseudomonas and so levofloxacin was also prescribed. She did not understand why a second antibiotic was being added so she has not yet initiated this. 03/14/2022: The wound is about the same, to perhaps a little bit larger. There is a fair amount of slough accumulation on the surface, as well as some hypertrophic granulation tissue. She is still taking levofloxacin, but has completed taking Augmentin. She has her [employer] compound with her today. 12/14; the patient has a significant circular wound on the posterior right calf. She is using [employer] and silver alginate under 3 layer compression. Her ABIs were within normal limits at 0.97. This may have been traumatic or an insect bite at the start I reviewed these records. 04/04/2022: Since I last saw the wound, it has contracted considerably. There is a fairly thick layer of slough on the surface, as it has not been debrided since the last time I did it. Edema control is excellent and the periwound skin is in much better condition. 04/12/2022: No significant change in the wound dimensions. It is filling with granulation tissue. Still with slough accumulation on the surface. 04/19/2022: The wound is smaller this week. There is some slough accumulation on the surface. 04/26/2022: The wound is smaller again  this week and significantly cleaner. The wound surface is a little bit drier than ideal. 05/03/2022: The wound measurements were about the same, but visually it appears smaller. There is still some undermining at the top of the wound. Moisture balance is  better this week. 05/10/2022: The wound measured smaller today. There is still a fair amount of undermining present. Slough has built up on the surface. We are still awaiting snap VAC approval. 05/17/2022: The wound is a little bit smaller today. There is less slough on the surface, but the granulation tissue still is not very robust. She has been approved for snap VAC but will have a 20% coinsurance and it is not clear what that amount would end up being for her. 05/27/2022: The surface of the wound has deteriorated and it is gray and fibrotic. No significant odor, but the drainage on her dressing was a little bit purulent. 05/31/2022: The wound looks quite a bit better today. It is still a little fibrotic but no longer has purulent-looking drainage. The color is better. She spoke with her insurance company and is interested in trying the snap VAC, now that she is aware of the cost to her. 06/07/2022: After 1 week in the snap VAC, there has been substantial improvement to her wound. The undermined portion is closing in. The surface has a healthier color and appearance. There is very minimal slough accumulation. 06/14/2022: The more shallow part of the undermined portion of her wound has closed. There is still some undermining from about 1-2 o'clock. The surface continues to improve. Minimal slough accumulation. Electronic Signature(s) Signed: 06/14/2022 4:06:12 PM By: [name] Maudlin MD FACS Entered By: [name] on 06/14/2022 16:06:12 -------------------------------------------------------------------------------- Physical Exam Details Patient Name: Date of Service: [name], [name] [name] H. 06/14/2022 3:00 PM Medical Record Number: [id number] Patient Account Number: [id number] Date of Birth/Sex: Treating RN: 02-05-1961 (62 y.o. F) Primary Care Provider: Loralyn [name] Other Clinician: Referring Provider: Treating Provider/Extender: [name] in Treatment:  20 Constitutional no acute distress. Respiratory Normal work of breathing on room air. Notes 06/14/2022: The more shallow part of the undermined portion of her wound has closed. There is still some undermining from about 1-2 o'clock. The surface continues to improve. Minimal slough accumulation. Electronic Signature(s) Signed: 06/14/2022 4:09:02 PM By: [name] Maudlin MD FACS Entered By: [name] on 06/14/2022 16:09:02 -------------------------------------------------------------------------------- Physician Orders Details Patient Name: Date of Service: [name], [name] [name] H. 06/14/2022 3:00 PM Medical Record Number: [id number] Patient Account Number: [id number] Date of Birth/Sex: Treating RN: 04-14-1960 (62 y.o. [name] Primary Care Provider: Loralyn [name] Other Clinician: Referring Provider: Treating Provider/Extender: [name] in Treatment: [address], [place] ([id number]) [id number] Page 4 of 10 Verbal / Phone Orders: No Diagnosis Coding Follow-up Appointments ppointment in 2 weeks. - Dr. Celine [name] RM 3 or 1 Return A Nurse Visit: - return in one week Anesthetic (In clinic) Topical Lidocaine 4% applied to wound bed Bathing/ Shower/ Hygiene May shower with protection but do not get wound dressing(s) wet. Protect dressing(s) with water repellant cover (for example, large plastic bag) or a cast cover and may then take shower. Edema Control - Lymphedema / SCD / Other Bilateral Lower Extremities Avoid standing for long periods of time. Exercise regularly Moisturize legs daily. Compression stocking or Garment 20-30 mm/Hg pressure to: - left leg daily. Apply first thing in the morning, remove at night. Wound Treatment Wound #2 - Lower Leg Wound Laterality: Right, Posterior Cleanser: Soap and  Water 1 x Per Week/30 Days Discharge Instructions: May shower and wash wound with dial antibacterial soap and water  prior to dressing change. Cleanser: Wound Cleanser 1 x Per Week/30 Days Discharge Instructions: Cleanse the wound with wound cleanser prior to applying a clean dressing using gauze sponges, not tissue or cotton balls. Peri-Wound Care: Sween Lotion (Moisturizing lotion) 1 x Per Week/30 Days Discharge Instructions: Apply moisturizing lotion as directed Prim Dressing: SNAP VAC ary 1 x Per Week/30 Days Secondary Dressing: Woven Gauze Sponge, Non-Sterile 4x4 in 1 x Per Week/30 Days Discharge Instructions: Apply over primary dressing as directed. Compression Wrap: ThreePress (3 layer compression wrap) 1 x Per Week/30 Days Discharge Instructions: Apply three layer compression as directed. Compression Wrap: Unnaboot w/Calamine, 4x10 (in/yd) 1 x Per Week/30 Days Discharge Instructions: Apply Unnaboot at top of leg Electronic Signature(s) Signed: 06/14/2022 4:24:33 PM By: [name] Maudlin MD FACS Entered By: [name] on 06/14/2022 16:22:15 -------------------------------------------------------------------------------- Problem List Details Patient Name: Date of Service: [name], [name] [name] H. 06/14/2022 3:00 PM Medical Record Number: [id number] Patient Account Number: [id number] Date of Birth/Sex: Treating RN: [birth year] (62 y.o. F) Primary Care Provider: Loralyn [name] Other Clinician: Referring Provider: Treating Provider/Extender: [name] in Treatment: 20 Active Problems ICD-10 Encounter Code Description Active Date MDM Diagnosis L97.212 Non-pressure chronic ulcer of right calf with fat layer exposed 01/23/2022 No Yes E66.01 Morbid (severe) obesity due to excess calories 01/23/2022 No Yes I10 Essential (primary) hypertension 01/23/2022 No Yes MITZY, COSTELLA ([id number]) [phone].pdf Page 5 of 10 R60.0 Localized edema 01/23/2022 No Yes Inactive Problems Resolved Problems Electronic Signature(s) Signed: 06/14/2022 4:04:15 PM  By: [name] Maudlin MD FACS Entered By: [name] on 06/14/2022 16:04:14 -------------------------------------------------------------------------------- Progress Note Details Patient Name: Date of Service: [name], [name] [name] H. 06/14/2022 3:00 PM Medical Record Number: [id number] Patient Account Number: [id number] Date of Birth/Sex: Treating RN: [birth year] (62 y.o. F) Primary Care Provider: Loralyn [name] Other Clinician: Referring Provider: Treating Provider/Extender: [name] in Treatment: 20 Subjective Chief Complaint Information obtained from Patient RLE ulcer History of Present Illness (HPI) 02/16/2020 upon evaluation today patient actually appears to be doing poorly in regard to her left medial lower extremity ulcer. This is actually an area that she tells me she has had intermittent issues with over the years although has been closed for some time she typically uses compression right now she has juxta lite compression wraps. With that being said she tells me that this nonetheless open several weeks/months ago and has been given her trouble since. She does have a history of chronic venous insufficiency she is seeing specialist for this in the past she has had an ablation as well as sclerotherapy. With that being said she also has hypertension chronically which is managed by her primary care provider. In general she seems to be worsening overall with regard to the wound and states that she finally realized that she needed to come in and have somebody look at this and not continue to try to manage this on her own. No fevers, chills, nausea, vomiting, or diarrhea. 02/23/2020 on evaluation today patient appears to be doing well with regard to her wound. This is showing some signs of improvement which is great news still were not quite at the point where I would like to be as far as the overall appearance of the wound is concerned but I do believe this  is better than last week. I do believe the Iodoflex is helping as  well. 03/08/2020 upon evaluation today patient appears to be doing well with regard to her wound. She has been tolerating the dressing changes without complication. Fortunately I feel like she has made great progress with the Iodoflex but I feel like it may be the point rest to switch to something else possibly a collagen- based dressing at this time. 03/15/2020 upon evaluation today patient appears to be doing excellent in regard to her leg ulcer. She has been tolerating the dressing changes without complication. Fortunately there is no signs of active infection. Overall she is measuring a little bit smaller today which is great news. 03/22/2020 upon evaluation today patient appears to be doing well with regard to her wound. She has been tolerating the dressing changes without complication. Fortunately there is no signs of active infection at this time. 03/28/2020; patient I do not usually see however she has a wound on the left anterior lower leg secondary to chronic venous insufficiency we have been using silver collagen under compression. She arrives in clinic with a nonviable surface requiring debridement 04/12/2020 upon evaluation today patient appears to be doing well all things considered with regard to her leg ulcer. She is tolerating the dressing changes without complication there is minimal dry skin around the edges of the wound that may be trapping and stopping some of the events of the new skin I am can work on that today. Otherwise the surface of the wound appears to be doing excellent. 04/19/2020 upon evaluation today patient appears to be doing well with regard to her leg ulcer. She has been tolerating dressing changes without complication. Fortunately there is no signs of active infection at this time. No fever chills noted overall very pleased with how things seem to be progressing. 04/26/2020 on evaluation today patient  appears to be doing well with regard to her wound currently. Is showing signs of excellent improvement overall is filling in nicely and there does not appear to be any signs of infection. No fevers, chills, nausea, vomiting, or diarrhea. 05/03/2020 upon evaluation today patient appears to be doing well with regard to her wound on the leg. This overall showing signs of good improvement which is great she has some good epithelial growth and overall I think that things are moving in the correct direction. We likewise going to continue with the wound care measures as before since she seems to making such good improvement. 2/3; venous wound on the left medial leg. This is contracting. We are using Prisma and 3 layer compression. She has a stocking and waiting in the eventuality this heals. She is already using it on the right 05/17/2020 upon evaluation today patient appears to be doing well with regard to her leg ulcer. She has been tolerating the dressing changes without complication. Fortunately there is no signs of active infection which is great news and overall very pleased with where things stand today. No fevers, chills, nausea, vomiting, or diarrhea. 05/24/2020 upon evaluation today patient appears to be doing well with regard to her wound. Overall I feel like she is making excellent progress. There does not appear to be any signs of active infection which is great news. 05/31/2020 upon evaluation today patient appears to be doing well with regard to her wound. There does not appear to be any signs of active infection which is [name], [name] ([id number]) [phone].pdf Page 6 of 10 great news overall I am extremely pleased with where things stand today. READMISSION 01/23/2022 She returns to clinic today with  a new wound on her right posterior calf. She says that she was cleaning out an old shed near the middle of August this year and then noticed what seemed to be a bug  bite on her right posterior calf. It was itchy, red, and raised. By the end of September, an ulcer had developed. She has been applying various topical creams such as hydrocortisone and others to the site. When it was not improving, she made an appointment in the wound care center. ABI in clinic today was 0.97. On her right posterior calf, there is a circular wound with necrotic fat and black eschar present. There is no purulent drainage or malodor. The periwound skin is in good condition with just a little induration that appears to be secondary to inflammation. 01/31/2022: The wound measures a little bit larger today, but overall is quite a bit cleaner. There is some undermining from 9:00 to 3:00. She is having some periwound itching and says that her wrap slid. 02/08/2022: Despite using an Unna boot first layer at the top of her wrap, it slid again and it looks like there is been some bruising at the wound site. The wound is about the same size in terms of dimensions. There is a fair amount of slough and nonviable tissue still present. Edema control is better than last week. 02/15/2022: The wound dimensions are about the same. There is less nonviable tissue present. The periwound erythema has improved. 02/22/2022: The wound has deteriorated over the past week. It is larger and the periwound is more edematous, erythematous, and indurated. It looks as though there has been more tissue breakdown with undermining present. She is having more pain. There is a foul odor coming from the wound. 02/26/2022: The wound looks quite a bit better today and the odor is gone. I still do not have her culture data back, but she seems to be responding well to the Augmentin. 03/06/2022: Her wound continues to improve. There is still some slough accumulation, but the periwound skin is less inflamed. Her culture returned with a polymicrobial population including Pseudomonas and so levofloxacin was also prescribed. She did  not understand why a second antibiotic was being added so she has not yet initiated this. 03/14/2022: The wound is about the same, to perhaps a little bit larger. There is a fair amount of slough accumulation on the surface, as well as some hypertrophic granulation tissue. She is still taking levofloxacin, but has completed taking Augmentin. She has her [employer] compound with her today. 12/14; the patient has a significant circular wound on the posterior right calf. She is using [facility] and silver alginate under 3 layer compression. Her ABIs were within normal limits at 0.97. This may have been traumatic or an insect bite at the start I reviewed these records. 04/04/2022: Since I last saw the wound, it has contracted considerably. There is a fairly thick layer of slough on the surface, as it has not been debrided since the last time I did it. Edema control is excellent and the periwound skin is in much better condition. 04/12/2022: No significant change in the wound dimensions. It is filling with granulation tissue. Still with slough accumulation on the surface. 04/19/2022: The wound is smaller this week. There is some slough accumulation on the surface. 04/26/2022: The wound is smaller again this week and significantly cleaner. The wound surface is a little bit drier than ideal. 05/03/2022: The wound measurements were about the same, but visually it appears smaller. There is  still some undermining at the top of the wound. Moisture balance is better this week. 05/10/2022: The wound measured smaller today. There is still a fair amount of undermining present. Slough has built up on the surface. We are still awaiting snap VAC approval. 05/17/2022: The wound is a little bit smaller today. There is less slough on the surface, but the granulation tissue still is not very robust. She has been approved for snap VAC but will have a 20% coinsurance and it is not clear what that amount would end up being for  her. 05/27/2022: The surface of the wound has deteriorated and it is gray and fibrotic. No significant odor, but the drainage on her dressing was a little bit purulent. 05/31/2022: The wound looks quite a bit better today. It is still a little fibrotic but no longer has purulent-looking drainage. The color is better. She spoke with her insurance company and is interested in trying the snap VAC, now that she is aware of the cost to her. 06/07/2022: After 1 week in the snap VAC, there has been substantial improvement to her wound. The undermined portion is closing in. The surface has a healthier color and appearance. There is very minimal slough accumulation. 06/14/2022: The more shallow part of the undermined portion of her wound has closed. There is still some undermining from about 1-2 o'clock. The surface continues to improve. Minimal slough accumulation. Patient History Information obtained from Patient. Family History Cancer - Mother, Diabetes - Father, Hypertension - Mother, Kidney Disease - Mother, Lung Disease - Father, Thyroid Problems - Paternal Grandparents, No family history of Heart Disease, Seizures, Stroke, Tuberculosis. Social History Never smoker, Marital Status - Married, Alcohol Use - Never, Drug Use - No History, Caffeine Use - Daily - coffee. Medical History Eyes Denies history of Cataracts, Glaucoma, Optic Neuritis Ear/Nose/Mouth/Throat Denies history of Chronic sinus problems/congestion, Middle ear problems Hematologic/Lymphatic Patient has history of Anemia - iron Denies history of Hemophilia, Human Immunodeficiency Virus, Lymphedema, Sickle Cell Disease Respiratory Patient has history of Sleep Apnea - CPAP Denies history of Aspiration, Asthma, Chronic Obstructive Pulmonary Disease (COPD), Pneumothorax, Tuberculosis Cardiovascular Patient has history of Hypertension, Peripheral Venous Disease Denies history of Angina, Arrhythmia, Congestive Heart Failure, Coronary Artery  Disease, Deep Vein Thrombosis, Hypotension, Myocardial Infarction, Peripheral Arterial Disease, Phlebitis, Vasculitis Gastrointestinal Denies history of Cirrhosis , Colitis, Crohnoos, Hepatitis A, Hepatitis B, Hepatitis C Endocrine [name], [name] ([id number]) [id number] Page 7 of 10 Denies history of Type I Diabetes, Type II Diabetes Genitourinary Denies history of End Stage Renal Disease Immunological Denies history of Lupus Erythematosus, Raynaudoos, Scleroderma Integumentary (Skin) Denies history of History of Burn Musculoskeletal Denies history of Gout, Rheumatoid Arthritis, Osteoarthritis, Osteomyelitis Neurologic Denies history of Dementia, Neuropathy, Quadriplegia, Paraplegia, Seizure Disorder Oncologic Denies history of Received Chemotherapy, Received Radiation Psychiatric Denies history of Anorexia/bulimia, Confinement Anxiety Hospitalization/Surgery History - cholecystectomy 1980s. - nephrolithasis 1980s. - plate and rod in right elbow surgery 2010. 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 Vitals Time Taken: 2:55 PM, Height: 61 in, Weight: 277 lbs, BMI: 52.3. Respiratory Normal work of breathing on room air. General Notes: 06/14/2022: The more shallow part of the undermined portion of her wound has closed. There is still some undermining from about 1-2 o'clock. The surface continues to improve. Minimal slough accumulation. Integumentary (Hair, Skin) Wound #2 status is Open. Original cause of wound was Insect Bite. The date acquired was:  11/21/2021. The wound has been in treatment 20 weeks. The wound is located on the Right,Posterior Lower Leg. The wound measures 2cm length x 2cm width x 0.3cm depth; 3.142cm^2 area and 0.942cm^3 volume. There is Fat Layer (Subcutaneous Tissue) exposed. There is no tunneling noted,  however, there is undermining starting at 2:00 and ending at 3:00 with a maximum distance of 0.3cm. There is a medium amount of purulent drainage noted. The wound margin is distinct with the outline attached to the wound base. There is large (67- 100%) red, friable granulation within the wound bed. There is a small (1-33%) amount of necrotic tissue within the wound bed including Adherent Slough. The periwound skin appearance had no abnormalities noted for moisture. The periwound skin appearance exhibited: Hemosiderin Staining. The periwound skin appearance did not exhibit: Excoriation, Scarring, Atrophie [name], Ecchymosis, Rubor, Erythema. Periwound temperature was noted as No Abnormality. Assessment Active Problems ICD-10 Non-pressure chronic ulcer of right calf with fat layer exposed Morbid (severe) obesity due to excess calories Essential (primary) hypertension Localized edema Procedures Wound #2 Pre-procedure diagnosis of Wound #2 is a Venous Leg Ulcer located on the Right,Posterior Lower Leg .Severity of Tissue Pre Debridement is: Fat layer exposed. There was a Selective/Open Wound Non-Viable Tissue Debridement with a total area of 4 sq cm performed by [name] Maudlin, MD. With the following instrument(s): Curette to remove Non-Viable tissue/material. Material removed includes [name] after achieving pain control using Lidocaine 5% topical ointment. No specimens were taken. A time out was conducted at 15:14, prior to the start of the procedure. A Minimum amount of bleeding was controlled with Pressure. The procedure was tolerated well. Post Debridement Measurements: 2cm length x 2cm width x 0.3cm depth; 0.942cm^3 volume. Character of Wound/Ulcer Post Debridement requires further debridement. Severity of Tissue Post Debridement is: Fat layer exposed. Post procedure Diagnosis Wound #2: Same as Pre-Procedure General Notes: Scribed for Dr. Celine [name] by [name] East, RN. ITHA, CAMPTON  ([id number]) [id number] Page 8 of 10 Plan Follow-up Appointments: Return Appointment in 2 weeks. - Dr. Celine [name] RM 3 or 1 Nurse Visit: - return in one week Anesthetic: (In clinic) Topical Lidocaine 4% applied to wound bed Bathing/ Shower/ Hygiene: May shower with protection but do not get wound dressing(s) wet. Protect dressing(s) with water repellant cover (for example, large plastic bag) or a cast cover and may then take shower. Edema Control - Lymphedema / SCD / Other: Avoid standing for long periods of time. Exercise regularly Moisturize legs daily. Compression stocking or Garment 20-30 mm/Hg pressure to: - left leg daily. Apply first thing in the morning, remove at night. WOUND #2: - Lower Leg Wound Laterality: Right, Posterior Cleanser: Soap and Water 1 x Per Week/30 Days Discharge Instructions: May shower and wash wound with dial antibacterial soap and water prior to dressing change. Cleanser: Wound Cleanser 1 x Per Week/30 Days Discharge Instructions: Cleanse the wound with wound cleanser prior to applying a clean dressing using gauze sponges, not tissue or cotton balls. Peri-Wound Care: Sween Lotion (Moisturizing lotion) 1 x Per Week/30 Days Discharge Instructions: Apply moisturizing lotion as directed Prim Dressing: SNAP VAC 1 x Per Week/30 Days ary Secondary Dressing: Woven Gauze Sponge, Non-Sterile 4x4 in 1 x Per Week/30 Days Discharge Instructions: Apply over primary dressing as directed. Com pression Wrap: ThreePress (3 layer compression wrap) 1 x Per Week/30 Days Discharge Instructions: Apply three layer compression as directed. Com pression Wrap: Unnaboot w/Calamine, 4x10 (in/yd) 1 x Per Week/30 Days Discharge Instructions: Apply Unnaboot at top of  leg 06/14/2022: The more shallow part of the undermined portion of her wound has closed. There is still some undermining from about 1-2 o'clock. The surface continues to improve. Minimal slough  accumulation. I used a curette to debride the slough from the wound. We will continue to use the snap VAC for negative pressure wound therapy. She will have a nurse visit next week due to limited provider availability. Follow-up with me in 2 weeks. Electronic Signature(s) Signed: 06/14/2022 4:23:24 PM By: [name] Maudlin MD FACS Entered By: [name] on 06/14/2022 16:23:24 -------------------------------------------------------------------------------- HxROS Details Patient Name: Date of Service: [name], [name] [name] H. 06/14/2022 3:00 PM Medical Record Number: [id number] Patient Account Number: [id number] Date of Birth/Sex: Treating RN: [birth year] (62 y.o. F) Primary Care Provider: Loralyn [name] Other Clinician: Referring Provider: Treating Provider/Extender: [name] in Treatment: 20 Information Obtained From Patient Eyes Medical History: Negative for: Cataracts; Glaucoma; Optic Neuritis Ear/Nose/Mouth/Throat Medical History: Negative for: Chronic sinus problems/congestion; Middle ear problems Hematologic/Lymphatic Medical History: Positive for: Anemia - iron Negative for: Hemophilia; Human Immunodeficiency Virus; Lymphedema; Sickle Cell Disease Respiratory FAVOR, MASSOTH ([id number]) [id number] Page 9 of 10 Medical History: Positive for: Sleep Apnea - CPAP Negative for: Aspiration; Asthma; Chronic Obstructive Pulmonary Disease (COPD); Pneumothorax; Tuberculosis Cardiovascular Medical History: Positive for: Hypertension; Peripheral Venous Disease Negative for: Angina; Arrhythmia; Congestive Heart Failure; Coronary Artery Disease; Deep Vein Thrombosis; Hypotension; Myocardial Infarction; Peripheral Arterial Disease; Phlebitis; Vasculitis Gastrointestinal Medical History: Negative for: Cirrhosis ; Colitis; Crohns; Hepatitis A; Hepatitis B; Hepatitis C Endocrine Medical History: Negative for: Type I Diabetes;  Type II Diabetes Genitourinary Medical History: Negative for: End Stage Renal Disease Past Medical History Notes: years ago kidney stones Immunological Medical History: Negative for: Lupus Erythematosus; Raynauds; Scleroderma Integumentary (Skin) Medical History: Negative for: History of Burn Musculoskeletal Medical History: Negative for: Gout; Rheumatoid Arthritis; Osteoarthritis; Osteomyelitis Neurologic Medical History: Negative for: Dementia; Neuropathy; Quadriplegia; Paraplegia; Seizure Disorder Oncologic Medical History: Negative for: Received Chemotherapy; Received Radiation Past Medical History Notes: skin Ca removed from back years ago Psychiatric Medical History: Negative for: Anorexia/bulimia; Confinement Anxiety Immunizations Pneumococcal Vaccine: Received Pneumococcal Vaccination: No Implantable Devices None Hospitalization / Surgery History Type of Hospitalization/Surgery cholecystectomy 1980s nephrolithasis 1980s plate and rod in right elbow surgery 2010 Family and Social History Cancer: Yes - Mother; Diabetes: Yes - Father; Heart Disease: No; Hypertension: Yes - Mother; Kidney Disease: Yes - Mother; Lung Disease: Yes - Father; Seizures: No; Stroke: No; Thyroid Problems: Yes - Paternal Grandparents; Tuberculosis: No; Never smoker; Marital Status - Married; Alcohol Use: Never; Drug Use: No History; Caffeine Use: Daily - coffee; Financial Concerns: No; Food, Clothing or Shelter Needs: No; Support System Lacking: NoMAKIYAH, [name] ([id number]) [id number] Page 10 of 10 Transportation Concerns: No Electronic Signature(s) Signed: 06/14/2022 4:24:33 PM By: [name] Maudlin MD FACS Entered By: [name] on 06/14/2022 16:08:11 -------------------------------------------------------------------------------- SuperBill Details Patient Name: Date of Service: [name], [name] [name] H. 06/14/2022 Medical Record Number: [id number] Patient  Account Number: [id number] Date of Birth/Sex: Treating RN: 05-02-1960 (62 y.o. F) Primary Care Provider: Loralyn [name] Other Clinician: Referring Provider: Treating Provider/Extender: [name] in Treatment: 20 Diagnosis Coding ICD-10 Codes Code Description [id number] Non-pressure chronic ulcer of right calf with fat layer exposed E66.01 Morbid (severe) obesity due to excess calories I10 Essential (primary) hypertension R60.0 Localized edema Facility Procedures : CPT4 Code: [id number] Description: [id number] - DEBRIDE WOUND 1ST 20 SQ CM OR < ICD-10 Diagnosis Description L97.212 Non-pressure chronic ulcer of right calf with  fat layer exposed Modifier: Quantity: 1 : CPT4 Code: [id number] Description: [id number] - WOUND VAC-50 SQ CM OR LESS Modifier: Quantity: 1 Physician Procedures : CPT4 Code Description Modifier [id number] - WC PHYS LEVEL 4 - EST PT 25 ICD-10 Diagnosis Description [id number] Non-pressure chronic ulcer of right calf with fat layer exposed R60.0 Localized edema E66.01 Morbid (severe) obesity due to excess calories  I10 Essential (primary) hypertension Quantity: 1 : [id number] - WC PHYS DEBR WO ANESTH 20 SQ CM ICD-10 Diagnosis Description L97.212 Non-pressure chronic ulcer of right calf with fat layer exposed Quantity: 1 Electronic Signature(s) Signed: 06/14/2022 4:23:43 PM By: [name] Maudlin MD FACS Entered By: [name] on 06/14/2022 16:23:42

## 2022-06-21 ENCOUNTER — Encounter (HOSPITAL_BASED_OUTPATIENT_CLINIC_OR_DEPARTMENT_OTHER): Payer: BC Managed Care – PPO | Admitting: Internal Medicine

## 2022-06-21 ENCOUNTER — Ambulatory Visit (HOSPITAL_BASED_OUTPATIENT_CLINIC_OR_DEPARTMENT_OTHER): Payer: BC Managed Care – PPO | Admitting: Internal Medicine

## 2022-06-21 DIAGNOSIS — L97212 Non-pressure chronic ulcer of right calf with fat layer exposed: Secondary | ICD-10-CM | POA: Diagnosis not present

## 2022-06-24 ENCOUNTER — Other Ambulatory Visit: Payer: Self-pay | Admitting: *Deleted

## 2022-06-24 DIAGNOSIS — M545 Low back pain, unspecified: Secondary | ICD-10-CM

## 2022-06-24 MED ORDER — LIDOCAINE 5 % EX OINT: 1.0000 | TOPICAL_OINTMENT | CUTANEOUS | 0 refills | Status: DC | PRN

## 2022-06-25 NOTE — Progress Notes (Signed)
IAN, FLYTE ([id number]) [id number] Page 1 of 4 Visit Report for 06/21/2022 Arrival Information Details Patient Name: Date of Service: [place], [name] 06/21/2022 3:00 PM Medical Record Number: [id number] Patient Account Number: [id number] Date of Birth/Sex: Treating RN: 1960/07/11 (62 y.o. [name], [name] Primary Care Lebron Nauert: Loralyn Freshwater Other Clinician: Referring Ramona Ruark: Treating Selden Noteboom/Extender: Stacie Acres in Treatment: 21 Visit Information History Since Last Visit Added or deleted any medications: No Patient Arrived: Ambulatory Any new allergies or adverse reactions: No Arrival Time: 15:36 Had a fall or experienced change in No Accompanied By: self activities of daily living that may affect Transfer Assistance: None risk of falls: Patient Identification Verified: Yes Signs or symptoms of abuse/neglect since last visito No Secondary Verification Process Completed: Yes Hospitalized since last visit: No Patient Requires Transmission-Based Precautions: No Implantable device outside of the clinic excluding No Patient Has Alerts: No cellular tissue based products placed in the center since last visit: Has Compression in Place as Prescribed: Yes Pain Present Now: No Electronic Signature(s) Signed: 06/24/2022 5:06:20 PM By: [name] RN Entered By: [name] on 06/21/2022 15:36:42 -------------------------------------------------------------------------------- Compression Therapy Details Patient Name: Date of Service: [name], [name] 06/21/2022 3:00 PM Medical Record Number: [id number] Patient Account Number: [id number] Date of Birth/Sex: Treating RN: 29-Mar-1961 (62 y.o. [name] Primary Care Shalayna Ornstein: Loralyn Freshwater Other Clinician: Referring Adyline Huberty: Treating Camellia Popescu/Extender: Stacie Acres in Treatment: 21 Compression Therapy Performed for Wound  Assessment: Wound #2 Right,Posterior Lower Leg Performed By: Clinician [name], RN Compression Type: Three Layer Electronic Signature(s) Signed: 06/24/2022 5:06:20 PM By: [name] RN Entered By: [name] on 06/21/2022 15:39:33 -------------------------------------------------------------------------------- Encounter Discharge Information Details Patient Name: Date of Service: [address] [name] 06/21/2022 3:00 PM Medical Record Number: [id number] Patient Account Number: [id number] Date of Birth/Sex: Treating RN: 1960/12/03 (62 y.o. [name] Primary Care Keanu Lesniak: Loralyn Freshwater Other Clinician: Referring Ramaj Frangos: Treating Asim Gersten/Extender: Stacie Acres in Treatment: 21 Encounter Discharge Information Items Discharge Condition: Stable Ambulatory Status: Ambulatory Discharge Destination: Home Transportation: Private Auto Accompanied By: self Schedule Follow-up Appointment: Yes Clinical Summary of Care: TEMPLE, HAZELIP ([id number]) [phone].pdf Page 2 of 4 Electronic Signature(s) Signed: 06/24/2022 5:06:20 PM By: [name] RN Entered By: [name] on 06/21/2022 15:40:30 -------------------------------------------------------------------------------- Negative Pressure Wound Therapy Maintenance (NPWT) Details Patient Name: Date of Service: [place], [name] 06/21/2022 3:00 PM Medical Record Number: [id number] Patient Account Number: [id number] Date of Birth/Sex: Treating RN: 1960-04-23 (62 y.o. [name] Primary Care Iyahna Obriant: Loralyn Freshwater Other Clinician: Referring Danyel Tobey: Treating Meko Masterson/Extender: Stacie Acres in Treatment: 21 NPWT Maintenance Performed for: Wound #2 Right, Posterior Lower Leg Performed By: [name], RN Type: Other Coverage Size (sq cm): 4 Pressure Type: Constant Pressure Setting: 125 mmHG Drain Type: None Primary Contact:  None Sponge/Dressing Type: Foam- Blue Date Initiated: 05/31/2022 Dressing Removed: Yes Quantity of Sponges/Gauze Removed: 1 Canister Changed: Yes Canister Exudate Volume: 50 Dressing Reapplied: Yes Quantity of Sponges/Gauze Inserted: 1 Respones T Treatment: o tolerated well Days On NPWT : 22 Electronic Signature(s) Signed: 06/24/2022 5:06:20 PM By: [name] RN Entered By: [name] on 06/21/2022 15:39:09 -------------------------------------------------------------------------------- Patient/Caregiver Education Details Patient Name: Date of Service: [name], [place] [name] 3/15/2024andnbsp3:00 PM Medical Record Number: [id number] Patient Account Number: [id number] Date of Birth/Gender: Treating RN: Mar 05, 1961 (62 y.o. [name] Primary Care Physician: Loralyn Freshwater Other Clinician: Referring Physician: Treating Physician/Extender: Collier Bullock,  Laurine Blazer in Treatment: 21 Education Assessment Education Provided To: Patient Education Topics Provided Wound/Skin Impairment: Methods: Explain/Verbal Responses: Reinforcements needed, State content correctly [employer]) Signed: 06/24/2022 5:06:20 PM By: [name] RN Entered By: [name] on 06/21/2022 15:40:00 -------------------------------------------------------------------------------- Wound Assessment Details Patient Name: Date of Service: [name], [name] 06/21/2022 3:00 PM Medical Record Number: [id number] Patient Account Number: [id number] MICHELE, LUCKING ([id number]) [phone].pdf Page 3 of 4 Date of Birth/Sex: Treating RN: 1960/10/02 (62 y.o. [name], [place] Primary Care Aria Jarrard: Other Clinician: Loralyn Freshwater Referring Taziah Difatta: Treating Maura Braaten/Extender: Stacie Acres in Treatment: 21 Wound Status Wound Number: 2 Primary Venous Leg Ulcer Etiology: Wound Location: Right, Posterior Lower Leg Wound Status:  Open Wounding Event: Insect Bite Comorbid Anemia, Sleep Apnea, Hypertension, Peripheral Venous Date Acquired: 11/21/2021 History: Disease Weeks Of Treatment: 21 Clustered Wound: No Wound Measurements Length: (cm) 2 Width: (cm) 2 Depth: (cm) 0.3 Area: (cm) 3.142 Volume: (cm) 0.942 % Reduction in Area: -852.1% % Reduction in Volume: -851.5% Epithelialization: Small (1-33%) Tunneling: No Undermining: No Wound Description Classification: Full Thickness Without Exposed Suppor Wound Margin: Distinct, outline attached Exudate Amount: Medium Exudate Type: Purulent Exudate Color: yellow, brown, green t Structures Foul Odor After Cleansing: No Slough/Fibrino Yes Wound Bed Granulation Amount: Large (67-100%) Exposed Structure Granulation Quality: Red, Friable Fascia Exposed: No Necrotic Amount: Small (1-33%) Fat Layer (Subcutaneous Tissue) Exposed: Yes Necrotic Quality: Adherent Slough Tendon Exposed: No Muscle Exposed: No Joint Exposed: No Bone Exposed: No Periwound Skin Texture Texture Color No Abnormalities Noted: No No Abnormalities Noted: No Excoriation: No Atrophie Blanche: No Scarring: No Ecchymosis: No Erythema: No Moisture Hemosiderin Staining: Yes No Abnormalities Noted: Yes Rubor: No Temperature / Pain Temperature: No Abnormality Treatment Notes Wound #2 (Lower Leg) Wound Laterality: Right, Posterior Cleanser Soap and Water Discharge Instruction: May shower and wash wound with dial antibacterial soap and water prior to dressing change. Wound Cleanser Discharge Instruction: Cleanse the wound with wound cleanser prior to applying a clean dressing using gauze sponges, not tissue or cotton balls. Peri-Wound Care Sween Lotion (Moisturizing lotion) Discharge Instruction: Apply moisturizing lotion as directed Topical Primary Dressing SNAP VAC Secondary Dressing Woven Gauze Sponge, Non-Sterile 4x4 in Discharge Instruction: Apply over primary dressing as  directed. Secured With Compression BEYA, ZILLMER ([id number]) [id number] Page 4 of 4 ThreePress (3 layer compression wrap) Discharge Instruction: Apply three layer compression as directed. Unnaboot w/Calamine, 4x10 (in/yd) Discharge Instruction: Apply Unnaboot at top of leg Compression Stockings Add-Ons Electronic Signature(s) Signed: 06/24/2022 5:06:20 PM By: [name] RN Entered By: [name] on 06/21/2022 15:37:22 -------------------------------------------------------------------------------- [place] Details Patient Name: Date of Service: [name], [name] 06/21/2022 3:00 PM Medical Record Number: [id number] Patient Account Number: [id number] Date of Birth/Sex: Treating RN: Jun 13, 1960 (62 y.o. [name], [name] Primary Care Kyia Rhude: Loralyn Freshwater Other Clinician: Referring Rhylynn Perdomo: Treating Emmanuelle Coxe/Extender: Stacie Acres in Treatment: 21 Vital Signs Time Taken: 15:10 Temperature (F): 98.1 Height (in): 61 Pulse (bpm): 89 Weight (lbs): 277 Respiratory Rate (breaths/min): 16 Body Mass Index (BMI): 52.3 Blood Pressure (mmHg): 131/81 Reference Range: 80 - 120 mg / dl Electronic Signature(s) Signed: 06/24/2022 5:06:20 PM By: [name] RN Entered By: [name] on 06/21/2022 15:37:04

## 2022-06-25 NOTE — Progress Notes (Signed)
DONDRIA, FILIPOWICZ ([id number]) [id number] Page 1 of 1 Visit Report for 06/21/2022 SuperBill Details Patient Name: Date of Service: [place], [name] 06/21/2022 Medical Record Number: [id number] Patient Account Number: [id number] Date of Birth/Sex: Treating RN: 04/04/1961 (62 y.o. 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

## 2022-06-28 ENCOUNTER — Encounter (HOSPITAL_BASED_OUTPATIENT_CLINIC_OR_DEPARTMENT_OTHER): Payer: BC Managed Care – PPO | Admitting: General Surgery

## 2022-06-28 DIAGNOSIS — L97212 Non-pressure chronic ulcer of right calf with fat layer exposed: Secondary | ICD-10-CM | POA: Diagnosis not present

## 2022-06-29 NOTE — Progress Notes (Signed)
[name], [name] ([id number]) [id number] Page 1 of 10 Visit Report for 06/28/2022 Chief Complaint Document Details Patient Name: Date of Service: [name] 06/28/2022 7:30 A M Medical Record Number: [id number] Patient Account Number: [id number] Date of Birth/Sex: Treating RN: 1960-04-12 (62 y.o. F) Primary Care Provider: Loralyn [name] Other Clinician: Referring Provider: Treating Provider/Extender: [name] in Treatment: 22 Information Obtained from: Patient Chief Complaint RLE ulcer Electronic Signature(s) Signed: 06/28/2022 8:21:07 AM By: [name] Maudlin MD FACS Entered By: [name] on 06/28/2022 08:21:07 -------------------------------------------------------------------------------- Debridement Details Patient Name: Date of Service: [name], [name] 06/28/2022 7:30 A M Medical Record Number: [id number] Patient Account Number: [id number] Date of Birth/Sex: Treating RN: [birth year] (62 y.o. [name], [name] Primary Care Provider: Loralyn [name] Other Clinician: Referring Provider: Treating Provider/Extender: [name] in Treatment: 22 Debridement Performed for Assessment: Wound #2 Right,Posterior Lower Leg Performed By: Physician [name] Maudlin, MD Debridement Type: Debridement Severity of Tissue Pre Debridement: Fat layer exposed Level of Consciousness (Pre-procedure): Awake and Alert Pre-procedure Verification/Time Out Yes - 07:58 Taken: Start Time: 07:59 Pain Control: Lidocaine 5% topical ointment T Area Debrided (L x W): otal 3 (cm) x 2.8 (cm) = 8.4 (cm) Tissue and other material debrided: Non-Viable, Slough, Slough Level: Non-Viable Tissue Debridement Description: Selective/Open Wound Instrument: Curette Bleeding: Minimum Hemostasis Achieved: Pressure Response to Treatment: Procedure was tolerated well Level of Consciousness (Post- Awake and  Alert procedure): Post Debridement Measurements of Total Wound Length: (cm) 3 Width: (cm) 2.8 Depth: (cm) 0.3 Volume: (cm) 1.979 Character of Wound/Ulcer Post Debridement: Requires Further Debridement Severity of Tissue Post Debridement: Fat layer exposed Post Procedure Diagnosis Same as Pre-procedure Notes Scribed for Dr. Celine [name] by [name] East, RN Electronic Signature(s) Signed: 06/28/2022 1:35:04 PM By: [name] Maudlin MD FACS Signed: 06/28/2022 4:08:25 PM By: [name] East RN Entered By: [name] on 06/28/2022 08:00:56 [name] ([id number]) [id number] Page 2 of 10 -------------------------------------------------------------------------------- HPI Details Patient Name: Date of Service: [name] 06/28/2022 7:30 A M Medical Record Number: [id number] Patient Account Number: [id number] Date of Birth/Sex: Treating RN: 08/11/1960 (62 y.o. F) Primary Care Provider: Loralyn [name] Other Clinician: Referring Provider: Treating Provider/Extender: [name] in Treatment: 22 History of Present Illness HPI Description: 02/16/2020 upon evaluation today patient actually appears to be doing poorly in regard to her left medial lower extremity ulcer. This is actually an area that she tells me she has had intermittent issues with over the years although has been closed for some time she typically uses compression right now she has juxta lite compression wraps. With that being said she tells me that this nonetheless open several weeks/months ago and has been given her trouble since. She does have a history of chronic venous insufficiency she is seeing specialist for this in the past she has had an ablation as well as sclerotherapy. With that being said she also has hypertension chronically which is managed by her primary care provider. In general she seems to be worsening overall with regard to the wound and states  that she finally realized that she needed to come in and have somebody look at this and not continue to try to manage this on her own. No fevers, chills, nausea, vomiting, or diarrhea. 02/23/2020 on evaluation today patient appears to be doing well with regard to her wound. This is showing some signs of improvement which is great news still were not quite at the point where I  would like to be as far as the overall appearance of the wound is concerned but I do believe this is better than last week. I do believe the Iodoflex is helping as well. 03/08/2020 upon evaluation today patient appears to be doing well with regard to her wound. She has been tolerating the dressing changes without complication. Fortunately I feel like she has made great progress with the Iodoflex but I feel like it may be the point rest to switch to something else possibly a collagen- based dressing at this time. 03/15/2020 upon evaluation today patient appears to be doing excellent in regard to her leg ulcer. She has been tolerating the dressing changes without complication. Fortunately there is no signs of active infection. Overall she is measuring a little bit smaller today which is great news. 03/22/2020 upon evaluation today patient appears to be doing well with regard to her wound. She has been tolerating the dressing changes without complication. Fortunately there is no signs of active infection at this time. 03/28/2020; patient I do not usually see however she has a wound on the left anterior lower leg secondary to chronic venous insufficiency we have been using silver collagen under compression. She arrives in clinic with a nonviable surface requiring debridement 04/12/2020 upon evaluation today patient appears to be doing well all things considered with regard to her leg ulcer. She is tolerating the dressing changes without complication there is minimal dry skin around the edges of the wound that may be trapping and stopping  some of the events of the new skin I am can work on that today. Otherwise the surface of the wound appears to be doing excellent. 04/19/2020 upon evaluation today patient appears to be doing well with regard to her leg ulcer. She has been tolerating dressing changes without complication. Fortunately there is no signs of active infection at this time. No fever chills noted overall very pleased with how things seem to be progressing. 04/26/2020 on evaluation today patient appears to be doing well with regard to her wound currently. Is showing signs of excellent improvement overall is filling in nicely and there does not appear to be any signs of infection. No fevers, chills, nausea, vomiting, or diarrhea. 05/03/2020 upon evaluation today patient appears to be doing well with regard to her wound on the leg. This overall showing signs of good improvement which is great she has some good epithelial growth and overall I think that things are moving in the correct direction. We likewise going to continue with the wound care measures as before since she seems to making such good improvement. 2/3; venous wound on the left medial leg. This is contracting. We are using Prisma and 3 layer compression. She has a stocking and waiting in the eventuality this heals. She is already using it on the right 05/17/2020 upon evaluation today patient appears to be doing well with regard to her leg ulcer. She has been tolerating the dressing changes without complication. Fortunately there is no signs of active infection which is great news and overall very pleased with where things stand today. No fevers, chills, nausea, vomiting, or diarrhea. 05/24/2020 upon evaluation today patient appears to be doing well with regard to her wound. Overall I feel like she is making excellent progress. There does not appear to be any signs of active infection which is great news. 05/31/2020 upon evaluation today patient appears to be doing well  with regard to her wound. There does not appear to be any signs  of active infection which is great news overall I am extremely pleased with where things stand today. READMISSION 01/23/2022 She returns to clinic today with a new wound on her right posterior calf. She says that she was cleaning out an old shed near the middle of August this year and then noticed what seemed to be a bug bite on her right posterior calf. It was itchy, red, and raised. By the end of September, an ulcer had developed. She has been applying various topical creams such as hydrocortisone and others to the site. When it was not improving, she made an appointment in the wound care center. ABI in clinic today was 0.97. On her right posterior calf, there is a circular wound with necrotic fat and black eschar present. There is no purulent drainage or malodor. The periwound skin is in good condition with just a little induration that appears to be secondary to inflammation. 01/31/2022: The wound measures a little bit larger today, but overall is quite a bit cleaner. There is some undermining from 9:00 to 3:00. She is having some periwound itching and says that her wrap slid. 02/08/2022: Despite using an Unna boot first layer at the top of her wrap, it slid again and it looks like there is been some bruising at the wound site. The wound is about the same size in terms of dimensions. There is a fair amount of slough and nonviable tissue still present. Edema control is better than last week. 02/15/2022: The wound dimensions are about the same. There is less nonviable tissue present. The periwound erythema has improved. 02/22/2022: The wound has deteriorated over the past week. It is larger and the periwound is more edematous, erythematous, and indurated. It looks as though there has been more tissue breakdown with undermining present. She is having more pain. There is a foul odor coming from the wound. 02/26/2022: The wound looks quite  a bit better today and the odor is gone. I still do not have her culture data back, but she seems to be responding well to the Augmentin. 03/06/2022: Her wound continues to improve. There is still some slough accumulation, but the periwound skin is less inflamed. Her culture returned with a JONINE, PEABODY ([id number]) [id number] Page 3 of 10 polymicrobial population including Pseudomonas and so levofloxacin was also prescribed. She did not understand why a second antibiotic was being added so she has not yet initiated this. 03/14/2022: The wound is about the same, to perhaps a little bit larger. There is a fair amount of slough accumulation on the surface, as well as some hypertrophic granulation tissue. She is still taking levofloxacin, but has completed taking Augmentin. She has her [employer] compound with her today. 12/14; the patient has a significant circular wound on the posterior right calf. She is using [employer] and silver alginate under 3 layer compression. Her ABIs were within normal limits at 0.97. This may have been traumatic or an insect bite at the start I reviewed these records. 04/04/2022: Since I last saw the wound, it has contracted considerably. There is a fairly thick layer of slough on the surface, as it has not been debrided since the last time I did it. Edema control is excellent and the periwound skin is in much better condition. 04/12/2022: No significant change in the wound dimensions. It is filling with granulation tissue. Still with slough accumulation on the surface. 04/19/2022: The wound is smaller this week. There is some slough accumulation on the surface. 04/26/2022: The wound  is smaller again this week and significantly cleaner. The wound surface is a little bit drier than ideal. 05/03/2022: The wound measurements were about the same, but visually it appears smaller. There is still some undermining at the top of the wound. Moisture balance is  better this week. 05/10/2022: The wound measured smaller today. There is still a fair amount of undermining present. Slough has built up on the surface. We are still awaiting snap VAC approval. 05/17/2022: The wound is a little bit smaller today. There is less slough on the surface, but the granulation tissue still is not very robust. She has been approved for snap VAC but will have a 20% coinsurance and it is not clear what that amount would end up being for her. 05/27/2022: The surface of the wound has deteriorated and it is gray and fibrotic. No significant odor, but the drainage on her dressing was a little bit purulent. 05/31/2022: The wound looks quite a bit better today. It is still a little fibrotic but no longer has purulent-looking drainage. The color is better. She spoke with her insurance company and is interested in trying the snap VAC, now that she is aware of the cost to her. 06/07/2022: After 1 week in the snap VAC, there has been substantial improvement to her wound. The undermined portion is closing in. The surface has a healthier color and appearance. There is very minimal slough accumulation. 06/14/2022: The more shallow part of the undermined portion of her wound has closed. There is still some undermining from about 1-2 o'clock. The surface continues to improve. Minimal slough accumulation. 06/28/2022: The wound is shallower this week and the undermining has essentially closed and completely. The surface tissue is improving. Electronic Signature(s) Signed: 06/28/2022 8:21:39 AM By: [name] Maudlin MD FACS Entered By: [name] on 06/28/2022 08:21:38 -------------------------------------------------------------------------------- Physical Exam Details Patient Name: Date of Service: [name], [name] 06/28/2022 7:30 A M Medical Record Number: [id number] Patient Account Number: [id number] Date of Birth/Sex: Treating RN: April 06, 1961 (62 y.o. F) Primary Care Provider: Loralyn [name] Other Clinician: Referring Provider: Treating Provider/Extender: [name] in Treatment: 22 Constitutional no acute distress. Respiratory Normal work of breathing on room air. Notes 06/28/2022: The wound is shallower this week and the undermining has essentially closed in completely. The surface tissue is improving Electronic Signature(s) Signed: 06/28/2022 8:22:10 AM By: [name] Maudlin MD FACS Entered By: [name] on 06/28/2022 08:22:09 -------------------------------------------------------------------------------- Physician Orders Details Patient Name: Date of Service: [name], [name] 06/28/2022 7:30 A M Medical Record Number: [id number] Patient Account Number: [id number] Date of Birth/Sex: Treating RN: [birth year] (62 y.o. [name] Primary Care Provider: Loralyn [name] Other Clinician: Referring Provider: Treating Provider/Extender: [name] in Treatment: [address] [place] ([id number]) [id number] Page 4 of 10 Verbal / Phone Orders: No Diagnosis Coding ICD-10 Coding Code Description [phone] Non-pressure chronic ulcer of right calf with fat layer exposed E66.01 Morbid (severe) obesity due to excess calories I10 Essential (primary) hypertension R60.0 Localized edema Follow-up Appointments ppointment in 1 week. - Dr. Celine [name] RM 3 or 1 Return A Anesthetic (In clinic) Topical Lidocaine 4% applied to wound bed Bathing/ Shower/ Hygiene May shower with protection but do not get wound dressing(s) wet. Protect dressing(s) with water repellant cover (for example, large plastic bag) or a cast cover and may then take shower. Edema Control - Lymphedema / SCD / Other Bilateral Lower Extremities Avoid standing for long periods of time. Exercise regularly Moisturize  legs daily. Compression stocking or Garment 20-30 mm/Hg pressure to: - left leg daily. Apply first  thing in the morning, remove at night. Wound Treatment Wound #2 - Lower Leg Wound Laterality: Right, Posterior Cleanser: Soap and Water 1 x Per Week/30 Days Discharge Instructions: May shower and wash wound with dial antibacterial soap and water prior to dressing change. Cleanser: Wound Cleanser 1 x Per Week/30 Days Discharge Instructions: Cleanse the wound with wound cleanser prior to applying a clean dressing using gauze sponges, not tissue or cotton balls. Peri-Wound Care: Sween Lotion (Moisturizing lotion) 1 x Per Week/30 Days Discharge Instructions: Apply moisturizing lotion as directed Prim Dressing: Promogran Prisma Matrix, 4.34 (sq in) (silver collagen) 1 x Per Week/30 Days ary Discharge Instructions: Moisten collagen with saline or hydrogel Prim Dressing: SNAP VAC ary 1 x Per Week/30 Days Secondary Dressing: Woven Gauze Sponge, Non-Sterile 4x4 in 1 x Per Week/30 Days Discharge Instructions: Apply over primary dressing as directed. Compression Wrap: ThreePress (3 layer compression wrap) 1 x Per Week/30 Days Discharge Instructions: Apply three layer compression as directed. Compression Wrap: Unnaboot w/Calamine, 4x10 (in/yd) 1 x Per Week/30 Days Discharge Instructions: Apply Unnaboot at top of leg Electronic Signature(s) Signed: 06/28/2022 1:35:04 PM By: [name] Maudlin MD FACS Entered By: [name] on 06/28/2022 08:22:19 -------------------------------------------------------------------------------- Problem List Details Patient Name: Date of Service: [name], [name] 06/28/2022 7:30 A M Medical Record Number: [id number] Patient Account Number: [id number] Date of Birth/Sex: Treating RN: 12-07-1960 (62 y.o. F) Primary Care Provider: Loralyn [name] Other Clinician: Referring Provider: Treating Provider/Extender: [name] in Treatment: [address] Active Problems ICD-10 Encounter [name], [name] ([id number])  [id number] Page 5 of 10 Encounter Code Description Active Date MDM Diagnosis L97.212 Non-pressure chronic ulcer of right calf with fat layer exposed 01/23/2022 No Yes E66.01 Morbid (severe) obesity due to excess calories 01/23/2022 No Yes I10 Essential (primary) hypertension 01/23/2022 No Yes R60.0 Localized edema 01/23/2022 No Yes Inactive Problems Resolved Problems Electronic Signature(s) Signed: 06/28/2022 8:20:57 AM By: [name] Maudlin MD FACS Entered By: [name] on 06/28/2022 08:20:57 -------------------------------------------------------------------------------- Progress Note Details Patient Name: Date of Service: [name], [name] 06/28/2022 7:30 A M Medical Record Number: [id number] Patient Account Number: [id number] Date of Birth/Sex: Treating RN: 11/23/1960 (62 y.o. F) Primary Care Provider: Loralyn [name] Other Clinician: Referring Provider: Treating Provider/Extender: [name] in Treatment: 22 Subjective Chief Complaint Information obtained from Patient RLE ulcer History of Present Illness (HPI) 02/16/2020 upon evaluation today patient actually appears to be doing poorly in regard to her left medial lower extremity ulcer. This is actually an area that she tells me she has had intermittent issues with over the years although has been closed for some time she typically uses compression right now she has juxta lite compression wraps. With that being said she tells me that this nonetheless open several weeks/months ago and has been given her trouble since. She does have a history of chronic venous insufficiency she is seeing specialist for this in the past she has had an ablation as well as sclerotherapy. With that being said she also has hypertension chronically which is managed by her primary care provider. In general she seems to be worsening overall with regard to the wound and states that she  finally realized that she needed to come in and have somebody look at this and not continue to try to manage this on her own. No fevers, chills, nausea, vomiting, or diarrhea. 02/23/2020 on evaluation today  patient appears to be doing well with regard to her wound. This is showing some signs of improvement which is great news still were not quite at the point where I would like to be as far as the overall appearance of the wound is concerned but I do believe this is better than last week. I do believe the Iodoflex is helping as well. 03/08/2020 upon evaluation today patient appears to be doing well with regard to her wound. She has been tolerating the dressing changes without complication. Fortunately I feel like she has made great progress with the Iodoflex but I feel like it may be the point rest to switch to something else possibly a collagen- based dressing at this time. 03/15/2020 upon evaluation today patient appears to be doing excellent in regard to her leg ulcer. She has been tolerating the dressing changes without complication. Fortunately there is no signs of active infection. Overall she is measuring a little bit smaller today which is great news. 03/22/2020 upon evaluation today patient appears to be doing well with regard to her wound. She has been tolerating the dressing changes without complication. Fortunately there is no signs of active infection at this time. 03/28/2020; patient I do not usually see however she has a wound on the left anterior lower leg secondary to chronic venous insufficiency we have been using silver collagen under compression. She arrives in clinic with a nonviable surface requiring debridement 04/12/2020 upon evaluation today patient appears to be doing well all things considered with regard to her leg ulcer. She is tolerating the dressing changes without complication there is minimal dry skin around the edges of the wound that may be trapping and stopping some of  the events of the new skin I am can work on that today. Otherwise the surface of the wound appears to be doing excellent. 04/19/2020 upon evaluation today patient appears to be doing well with regard to her leg ulcer. She has been tolerating dressing changes without complication. Fortunately there is no signs of active infection at this time. No fever chills noted overall very pleased with how things seem to be progressing. 04/26/2020 on evaluation today patient appears to be doing well with regard to her wound currently. Is showing signs of excellent improvement overall is filling in nicely and there does not appear to be any signs of infection. No fevers, chills, nausea, vomiting, or diarrhea. 05/03/2020 upon evaluation today patient appears to be doing well with regard to her wound on the leg. This overall showing signs of good improvement which is [name], [name] ([id number]) [id number] Page 6 of 10 great she has some good epithelial growth and overall I think that things are moving in the correct direction. We likewise going to continue with the wound care measures as before since she seems to making such good improvement. 2/3; venous wound on the left medial leg. This is contracting. We are using Prisma and 3 layer compression. She has a stocking and waiting in the eventuality this heals. She is already using it on the right 05/17/2020 upon evaluation today patient appears to be doing well with regard to her leg ulcer. She has been tolerating the dressing changes without complication. Fortunately there is no signs of active infection which is great news and overall very pleased with where things stand today. No fevers, chills, nausea, vomiting, or diarrhea. 05/24/2020 upon evaluation today patient appears to be doing well with regard to her wound. Overall I feel like she is making excellent  progress. There does not appear to be any signs of active infection which is  great news. 05/31/2020 upon evaluation today patient appears to be doing well with regard to her wound. There does not appear to be any signs of active infection which is great news overall I am extremely pleased with where things stand today. READMISSION 01/23/2022 She returns to clinic today with a new wound on her right posterior calf. She says that she was cleaning out an old shed near the middle of August this year and then noticed what seemed to be a bug bite on her right posterior calf. It was itchy, red, and raised. By the end of September, an ulcer had developed. She has been applying various topical creams such as hydrocortisone and others to the site. When it was not improving, she made an appointment in the wound care center. ABI in clinic today was 0.97. On her right posterior calf, there is a circular wound with necrotic fat and black eschar present. There is no purulent drainage or malodor. The periwound skin is in good condition with just a little induration that appears to be secondary to inflammation. 01/31/2022: The wound measures a little bit larger today, but overall is quite a bit cleaner. There is some undermining from 9:00 to 3:00. She is having some periwound itching and says that her wrap slid. 02/08/2022: Despite using an Unna boot first layer at the top of her wrap, it slid again and it looks like there is been some bruising at the wound site. The wound is about the same size in terms of dimensions. There is a fair amount of slough and nonviable tissue still present. Edema control is better than last week. 02/15/2022: The wound dimensions are about the same. There is less nonviable tissue present. The periwound erythema has improved. 02/22/2022: The wound has deteriorated over the past week. It is larger and the periwound is more edematous, erythematous, and indurated. It looks as though there has been more tissue breakdown with undermining present. She is having more pain.  There is a foul odor coming from the wound. 02/26/2022: The wound looks quite a bit better today and the odor is gone. I still do not have her culture data back, but she seems to be responding well to the Augmentin. 03/06/2022: Her wound continues to improve. There is still some slough accumulation, but the periwound skin is less inflamed. Her culture returned with a polymicrobial population including Pseudomonas and so levofloxacin was also prescribed. She did not understand why a second antibiotic was being added so she has not yet initiated this. 03/14/2022: The wound is about the same, to perhaps a little bit larger. There is a fair amount of slough accumulation on the surface, as well as some hypertrophic granulation tissue. She is still taking levofloxacin, but has completed taking Augmentin. She has her [employer] compound with her today. 12/14; the patient has a significant circular wound on the posterior right calf. She is using [employer] and silver alginate under 3 layer compression. Her ABIs were within normal limits at 0.97. This may have been traumatic or an insect bite at the start I reviewed these records. 04/04/2022: Since I last saw the wound, it has contracted considerably. There is a fairly thick layer of slough on the surface, as it has not been debrided since the last time I did it. Edema control is excellent and the periwound skin is in much better condition. 04/12/2022: No significant change in the wound dimensions.  It is filling with granulation tissue. Still with slough accumulation on the surface. 04/19/2022: The wound is smaller this week. There is some slough accumulation on the surface. 04/26/2022: The wound is smaller again this week and significantly cleaner. The wound surface is a little bit drier than ideal. 05/03/2022: The wound measurements were about the same, but visually it appears smaller. There is still some undermining at the top of the wound. Moisture balance is  better this week. 05/10/2022: The wound measured smaller today. There is still a fair amount of undermining present. Slough has built up on the surface. We are still awaiting snap VAC approval. 05/17/2022: The wound is a little bit smaller today. There is less slough on the surface, but the granulation tissue still is not very robust. She has been approved for snap VAC but will have a 20% coinsurance and it is not clear what that amount would end up being for her. 05/27/2022: The surface of the wound has deteriorated and it is gray and fibrotic. No significant odor, but the drainage on her dressing was a little bit purulent. 05/31/2022: The wound looks quite a bit better today. It is still a little fibrotic but no longer has purulent-looking drainage. The color is better. She spoke with her insurance company and is interested in trying the snap VAC, now that she is aware of the cost to her. 06/07/2022: After 1 week in the snap VAC, there has been substantial improvement to her wound. The undermined portion is closing in. The surface has a healthier color and appearance. There is very minimal slough accumulation. 06/14/2022: The more shallow part of the undermined portion of her wound has closed. There is still some undermining from about 1-2 o'clock. The surface continues to improve. Minimal slough accumulation. 06/28/2022: The wound is shallower this week and the undermining has essentially closed and completely. The surface tissue is improving. Patient History Information obtained from Patient. Family History Cancer - Mother, Diabetes - Father, Hypertension - Mother, Kidney Disease - Mother, Lung Disease - Father, Thyroid Problems - Paternal Grandparents, No family history of Heart Disease, Seizures, Stroke, Tuberculosis. Social History Never smoker, Marital Status - Married, Alcohol Use - Never, Drug Use - No History, Caffeine Use - Daily - coffee. Medical History Eyes Denies history of Cataracts,  Glaucoma, Optic Neuritis [name], [name] ([id number]) [id number] Page 7 of 10 Ear/Nose/Mouth/Throat Denies history of Chronic sinus problems/congestion, Middle ear problems Hematologic/Lymphatic Patient has history of Anemia - iron Denies history of Hemophilia, Human Immunodeficiency Virus, Lymphedema, Sickle Cell Disease Respiratory Patient has history of Sleep Apnea - CPAP Denies history of Aspiration, Asthma, Chronic Obstructive Pulmonary Disease (COPD), Pneumothorax, Tuberculosis Cardiovascular Patient has history of Hypertension, Peripheral Venous Disease Denies history of Angina, Arrhythmia, Congestive Heart Failure, Coronary Artery Disease, Deep Vein Thrombosis, Hypotension, Myocardial Infarction, Peripheral Arterial Disease, Phlebitis, Vasculitis Gastrointestinal Denies history of Cirrhosis , Colitis, Crohnoos, Hepatitis A, Hepatitis B, Hepatitis C Endocrine Denies history of Type I Diabetes, Type II Diabetes Genitourinary Denies history of End Stage Renal Disease Immunological Denies history of Lupus Erythematosus, Raynaudoos, Scleroderma Integumentary (Skin) Denies history of History of Burn Musculoskeletal Denies history of Gout, Rheumatoid Arthritis, Osteoarthritis, Osteomyelitis Neurologic Denies history of Dementia, Neuropathy, Quadriplegia, Paraplegia, Seizure Disorder Oncologic Denies history of Received Chemotherapy, Received Radiation Psychiatric Denies history of Anorexia/bulimia, Confinement Anxiety Hospitalization/Surgery History - cholecystectomy 1980s. - nephrolithasis 1980s. - plate and rod in right elbow surgery 2010. Medical A Surgical History Notes nd Genitourinary years ago kidney stones Oncologic skin  Ca removed from back years ago Objective Constitutional no acute distress. Vitals Time Taken: 7:40 AM, Height: 61 in, Weight: 277 lbs, BMI: 52.3. Respiratory Normal work of breathing on room air. General Notes:  06/28/2022: The wound is shallower this week and the undermining has essentially closed in completely. The surface tissue is improving Integumentary (Hair, Skin) Wound #2 status is Open. Original cause of wound was Insect Bite. The date acquired was: 11/21/2021. The wound has been in treatment 22 weeks. The wound is located on the Right,Posterior Lower Leg. The wound measures 3cm length x 2.8cm width x 0.3cm depth; 6.597cm^2 area and 1.979cm^3 volume. There is Fat Layer (Subcutaneous Tissue) exposed. There is no tunneling or undermining noted. There is a medium amount of purulent drainage noted. The wound margin is distinct with the outline attached to the wound base. There is large (67-100%) red, friable granulation within the wound bed. There is a small (1-33%) amount of necrotic tissue within the wound bed including Adherent Slough. The periwound skin appearance had no abnormalities noted for moisture. The periwound skin appearance exhibited: Hemosiderin Staining. The periwound skin appearance did not exhibit: Excoriation, Scarring, Atrophie [name], Ecchymosis, Rubor, Erythema. Periwound temperature was noted as No Abnormality. Assessment Active Problems ICD-10 Non-pressure chronic ulcer of right calf with fat layer exposed Morbid (severe) obesity due to excess calories Essential (primary) hypertension Localized edema [name], [name] ([id number]) [id number] Page 8 of 10 Procedures Wound #2 Pre-procedure diagnosis of Wound #2 is a Venous Leg Ulcer located on the Right,Posterior Lower Leg .Severity of Tissue Pre Debridement is: Fat layer exposed. There was a Selective/Open Wound Non-Viable Tissue Debridement with a total area of 8.4 sq cm performed by [name] Maudlin, MD. With the following instrument(s): Curette to remove Non-Viable tissue/material. Material removed includes [facility] after achieving pain control using Lidocaine 5% topical ointment. No specimens  were taken. A time out was conducted at 07:58, prior to the start of the procedure. A Minimum amount of bleeding was controlled with Pressure. The procedure was tolerated well. Post Debridement Measurements: 3cm length x 2.8cm width x 0.3cm depth; 1.979cm^3 volume. Character of Wound/Ulcer Post Debridement requires further debridement. Severity of Tissue Post Debridement is: Fat layer exposed. Post procedure Diagnosis Wound #2: Same as Pre-Procedure General Notes: Scribed for Dr. Celine [name] by [name] East, RN. Pre-procedure diagnosis of Wound #2 is a Venous Leg Ulcer located on the Right,Posterior Lower Leg . There was a Three Layer Compression Therapy Procedure by [name] East, RN. Post procedure Diagnosis Wound #2: Same as Pre-Procedure Notes: Urgo Lite. Plan Follow-up Appointments: Return Appointment in 1 week. - Dr. Celine [name] RM 3 or 1 Anesthetic: (In clinic) Topical Lidocaine 4% applied to wound bed Bathing/ Shower/ Hygiene: May shower with protection but do not get wound dressing(s) wet. Protect dressing(s) with water repellant cover (for example, large plastic bag) or a cast cover and may then take shower. Edema Control - Lymphedema / SCD / Other: Avoid standing for long periods of time. Exercise regularly Moisturize legs daily. Compression stocking or Garment 20-30 mm/Hg pressure to: - left leg daily. Apply first thing in the morning, remove at night. WOUND #2: - Lower Leg Wound Laterality: Right, Posterior Cleanser: Soap and Water 1 x Per Week/30 Days Discharge Instructions: May shower and wash wound with dial antibacterial soap and water prior to dressing change. Cleanser: Wound Cleanser 1 x Per Week/30 Days Discharge Instructions: Cleanse the wound with wound cleanser prior to applying a clean dressing using gauze sponges, not tissue or cotton  balls. Peri-Wound Care: Sween Lotion (Moisturizing lotion) 1 x Per Week/30 Days Discharge Instructions: Apply moisturizing lotion as  directed Prim Dressing: Promogran Prisma Matrix, 4.34 (sq in) (silver collagen) 1 x Per Week/30 Days ary Discharge Instructions: Moisten collagen with saline or hydrogel Prim Dressing: SNAP VAC 1 x Per Week/30 Days ary Secondary Dressing: Woven Gauze Sponge, Non-Sterile 4x4 in 1 x Per Week/30 Days Discharge Instructions: Apply over primary dressing as directed. Com pression Wrap: ThreePress (3 layer compression wrap) 1 x Per Week/30 Days Discharge Instructions: Apply three layer compression as directed. Com pression Wrap: Unnaboot w/Calamine, 4x10 (in/yd) 1 x Per Week/30 Days Discharge Instructions: Apply Unnaboot at top of leg 06/28/2022: The wound is shallower this week and the undermining has essentially closed and completely. The surface tissue is improving. I used a curette to debride slough from the wound surface. We will continue negative pressure wound therapy with a snap VAC. I am going to add Prisma silver collagen underneath the wound VAC sponge. Follow-up in 1 week. Electronic Signature(s) Signed: 06/28/2022 8:23:33 AM By: [name] Maudlin MD FACS Entered By: [name] on 06/28/2022 08:23:32 -------------------------------------------------------------------------------- HxROS Details Patient Name: Date of Service: [name], [name] 06/28/2022 7:30 A M Medical Record Number: [id number] Patient Account Number: [id number] Date of Birth/Sex: Treating RN: Jul 20, 1960 (62 y.o. F) Primary Care Provider: Loralyn [name] Other Clinician: Referring Provider: Treating Provider/Extender: [name] in Treatment: 22 Information Obtained From Patient [address] [name], [name] ([id number]) [id number] Page 9 of 10 Medical History: Negative for: Cataracts; Glaucoma; Optic Neuritis Ear/Nose/Mouth/Throat Medical History: Negative for: Chronic sinus problems/congestion; Middle ear problems Hematologic/Lymphatic Medical  History: Positive for: Anemia - iron Negative for: Hemophilia; Human Immunodeficiency Virus; Lymphedema; Sickle Cell Disease Respiratory Medical History: Positive for: Sleep Apnea - CPAP Negative for: Aspiration; Asthma; Chronic Obstructive Pulmonary Disease (COPD); Pneumothorax; Tuberculosis Cardiovascular Medical History: Positive for: Hypertension; Peripheral Venous Disease Negative for: Angina; Arrhythmia; Congestive Heart Failure; Coronary Artery Disease; Deep Vein Thrombosis; Hypotension; Myocardial Infarction; Peripheral Arterial Disease; Phlebitis; Vasculitis Gastrointestinal Medical History: Negative for: Cirrhosis ; Colitis; Crohns; Hepatitis A; Hepatitis B; Hepatitis C Endocrine Medical History: Negative for: Type I Diabetes; Type II Diabetes Genitourinary Medical History: Negative for: End Stage Renal Disease Past Medical History Notes: years ago kidney stones Immunological Medical History: Negative for: Lupus Erythematosus; Raynauds; Scleroderma Integumentary (Skin) Medical History: Negative for: History of Burn Musculoskeletal Medical History: Negative for: Gout; Rheumatoid Arthritis; Osteoarthritis; Osteomyelitis Neurologic Medical History: Negative for: Dementia; Neuropathy; Quadriplegia; Paraplegia; Seizure Disorder Oncologic Medical History: Negative for: Received Chemotherapy; Received Radiation Past Medical History Notes: skin Ca removed from back years ago Psychiatric Medical History: Negative for: Anorexia/bulimia; Confinement Anxiety Immunizations Pneumococcal VaccineERALYNN, [name] ([id number]) [id number] Page 10 of 10 Received Pneumococcal Vaccination: No Implantable Devices None Hospitalization / Surgery History Type of Hospitalization/Surgery cholecystectomy 1980s nephrolithasis 1980s plate and rod in right elbow surgery 2010 Family and Social History Cancer: Yes - Mother; Diabetes: Yes - Father; Heart  Disease: No; Hypertension: Yes - Mother; Kidney Disease: Yes - Mother; Lung Disease: Yes - Father; Seizures: No; Stroke: No; Thyroid Problems: Yes - Paternal Grandparents; Tuberculosis: No; Never smoker; Marital Status - Married; Alcohol Use: Never; Drug Use: No History; Caffeine Use: Daily - coffee; Financial Concerns: No; Food, Clothing or Shelter Needs: No; Support System Lacking: No; Transportation Concerns: No Electronic Signature(s) Signed: 06/28/2022 1:35:04 PM By: [name] Maudlin MD FACS Entered By: [name] on 06/28/2022 08:21:45 -------------------------------------------------------------------------------- SuperBill Details Patient Name: Date of Service: [name],  [name] 06/28/2022 Medical Record Number: [id number] Patient Account Number: [id number] Date of Birth/Sex: Treating RN: [birth year] (62 y.o. F) Primary Care Provider: Loralyn [name] Other Clinician: Referring Provider: Treating Provider/Extender: [name] in Treatment: 22 Diagnosis Coding ICD-10 Codes Code Description [phone] Non-pressure chronic ulcer of right calf with fat layer exposed E66.01 Morbid (severe) obesity due to excess calories I10 Essential (primary) hypertension R60.0 Localized edema Facility Procedures : CPT4 Code: [id number] Description: [id number] - DEBRIDE WOUND 1ST 20 SQ CM OR < ICD-10 Diagnosis Description L97.212 Non-pressure chronic ulcer of right calf with fat layer exposed Modifier: Quantity: 1 Physician Procedures : CPT4 Code Description Modifier [id number] - WC PHYS LEVEL 4 - EST PT 25 ICD-10 Diagnosis Description L97.212 Non-pressure chronic ulcer of right calf with fat layer exposed I10 Essential (primary) hypertension R60.0 Localized edema E66.01 Morbid  (severe) obesity due to excess calories Quantity: 1 : [id number] - WC PHYS DEBR WO ANESTH 20 SQ CM ICD-10 Diagnosis Description L97.212 Non-pressure chronic ulcer of right calf with fat  layer exposed Quantity: 1 Electronic Signature(s) Signed: 06/28/2022 8:23:50 AM By: [name] Maudlin MD FACS Entered By: [name] on 06/28/2022 08:23:50

## 2022-06-29 NOTE — Progress Notes (Signed)
[name], [name] ([id number]) [id number] Page 1 of 5 Visit Report for 06/28/2022 Arrival Information Details Patient Name: Date of Service: [place], [name] 06/28/2022 7:30 A M Medical Record Number: [id number] Patient Account Number: [id number] Date of Birth/Sex: Treating RN: 14-Mar-1961 (62 y.o. [name], [name] Primary Care [name]: [name] Other Clinician: Referring [name]: Treating [name]/Extender: [name] in Treatment: 22 Visit Information History Since Last Visit Added or deleted any medications: No Patient Arrived: Ambulatory Any new allergies or adverse reactions: No Arrival Time: 07:52 Had a fall or experienced change in No Accompanied By: self activities of daily living that may affect Transfer Assistance: None risk of falls: Patient Requires Transmission-Based Precautions: No Signs or symptoms of abuse/neglect since last visito No Patient Has Alerts: No Hospitalized since last visit: No Implantable device outside of the clinic excluding No cellular tissue based products placed in the center since last visit: Has Compression in Place as Prescribed: Yes Pain Present Now: No Electronic Signature(s) Signed: 06/28/2022 4:08:25 PM By: [name] East RN Entered By: [name] on 06/28/2022 07:53:01 -------------------------------------------------------------------------------- Compression Therapy Details Patient Name: Date of Service: [name], [name] 06/28/2022 7:30 A M Medical Record Number: [id number] Patient Account Number: [id number] Date of Birth/Sex: Treating RN: 1960/09/05 (62 y.o. [name] Primary Care Kishan Wachsmuth: [name] Other Clinician: Referring [name]: Treating [name]/Extender: [name] in Treatment: 22 Compression Therapy Performed for Wound Assessment: Wound #2 Right,Posterior Lower Leg Performed By: Clinician [name] East,  RN Compression Type: Three Layer Post Procedure Diagnosis Same as Pre-procedure Notes Art gallery manager) Signed: 06/28/2022 4:08:25 PM By: [name] East RN Entered By: [name] on 06/28/2022 08:01:26 -------------------------------------------------------------------------------- Lower Extremity Assessment Details Patient Name: Date of Service: [name], [name] 06/28/2022 7:30 A M Medical Record Number: [id number] Patient Account Number: [id number] Date of Birth/Sex: Treating RN: [birth year] (62 y.o. [name] Primary Care Alsie Younes: [name] Other Clinician: Referring Nain Rudd: Treating Alyse Kathan/Extender: [name] in Treatment: 22 Edema Assessment Assessed: [name]: No] [name]: No] W[LeftMAXWELL, [name] [id number] [Right: [id number] Page 2 of 5] Edema: [Left: Ye] [Right: s] Calf Left: Right: Point of Measurement: From Medial Instep 45 cm Ankle Left: Right: Point of Measurement: From Medial Instep 23 cm Vascular Assessment Pulses: Dorsalis Pedis Palpable: [Right:Yes] Electronic Signature(s) Signed: 06/28/2022 4:08:25 PM By: [name] East RN Entered By: [name] on 06/28/2022 07:48:46 -------------------------------------------------------------------------------- Multi Wound Chart Details Patient Name: Date of Service: [name], [name] 06/28/2022 7:30 A M Medical Record Number: [id number] Patient Account Number: [id number] Date of Birth/Sex: Treating RN: 1960-10-11 (62 y.o. F) Primary Care Gladyes Kudo: [name] Other Clinician: Referring [name]: Treating [name]/Extender: [name] in Treatment: 32 [2:Photos:] [N/A:N/A] Right, Posterior Lower Leg N/A N/A Wound Location: Insect Bite N/A N/A Wounding Event: Venous Leg Ulcer N/A N/A Primary Etiology: Anemia, Sleep Apnea, Hypertension, N/A N/A Comorbid History: Peripheral  Venous Disease 11/21/2021 N/A N/A Date Acquired: 22 N/A N/A Weeks of Treatment: Open N/A N/A Wound Status: No N/A N/A Wound Recurrence: 3x2.8x0.3 N/A N/A Measurements L x W x D (cm) 6.597 N/A N/A A (cm) : rea 1.979 N/A N/A Volume (cm) : -1899.10% N/A N/A % Reduction in A rea: -1899.00% N/A N/A % Reduction in Volume: Full Thickness Without Exposed N/A N/A Classification: Support Structures Medium N/A N/A Exudate A mount: Purulent N/A N/A Exudate Type: yellow, brown, green N/A N/A Exudate Color: Distinct, outline attached N/A N/A Wound Margin:  Large (67-100%) N/A N/A Granulation A mount: Red, Friable N/A N/A Granulation Quality: Small (1-33%) N/A N/A Necrotic A mount: Fat Layer (Subcutaneous Tissue): Yes N/A N/A Exposed Structures: Fascia: No Tendon: No Muscle: No Joint: No Bone: No Small (1-33%) N/A N/A Epithelialization: Debridement - Selective/Open Wound N/A N/A Debridement: Pre-procedure Verification/Time Out 07:58 N/A N/A Taken: Lidocaine 5% topical ointment N/A N/A Pain ControlEARNSTINE, [name] ([id number]) [id number] Page 3 of 5 Slough N/A N/A Tissue Debrided: Non-Viable Tissue N/A N/A Level: 8.4 N/A N/A Debridement A (sq cm): rea Curette N/A N/A Instrument: Minimum N/A N/A Bleeding: Pressure N/A N/A Hemostasis A chieved: Procedure was tolerated well N/A N/A Debridement Treatment Response: 3x2.8x0.3 N/A N/A Post Debridement Measurements L x W x D (cm) 1.979 N/A N/A Post Debridement Volume: (cm) Excoriation: No N/A N/A Periwound Skin Texture: Scarring: No No Abnormalities Noted N/A N/A Periwound Skin Moisture: Hemosiderin Staining: Yes N/A N/A Periwound Skin Color: Atrophie [name]: No Ecchymosis: No Erythema: No Rubor: No No Abnormality N/A N/A Temperature: Compression Therapy N/A N/A Procedures Performed: Debridement Treatment Notes Electronic Signature(s) Signed: 06/28/2022 8:21:02 AM By:  [name] Maudlin MD FACS Entered By: [name] on 06/28/2022 08:21:02 -------------------------------------------------------------------------------- Pain Assessment Details Patient Name: Date of Service: [name], [name] 06/28/2022 7:30 A M Medical Record Number: [id number] Patient Account Number: [id number] Date of Birth/Sex: Treating RN: 1961-03-29 (62 y.o. [name] Primary Care Nazareth Kirk: [name] Other Clinician: Referring Shaylie Eklund: Treating Tressa Maldonado/Extender: [name] in Treatment: 22 Active Problems Location of Pain Severity and Description of Pain Patient Has Paino No Site Locations Rate the pain. Current Pain Level: 0 Pain Management and Medication Current Pain Management: Electronic Signature(s) Signed: 06/28/2022 4:08:25 PM By: [name] East RN Entered By: [name] on 06/28/2022 07:59:06 [name] ([id number]) [id number] Page 4 of 5 -------------------------------------------------------------------------------- Wound Assessment Details Patient Name: Date of Service: [place], [name] 06/28/2022 7:30 A M Medical Record Number: [id number] Patient Account Number: [id number] Date of Birth/Sex: Treating RN: 1960-04-19 (63 y.o. [name], [place] Primary Care Phoebe Marter: [name] Other Clinician: Referring Kamerin Axford: Treating Dellis Voght/Extender: [name] in Treatment: 22 Wound Status Wound Number: 2 Primary Venous Leg Ulcer Etiology: Wound Location: Right, Posterior Lower Leg Wound Status: Open Wounding Event: Insect Bite Comorbid Anemia, Sleep Apnea, Hypertension, Peripheral Venous Date Acquired: 11/21/2021 History: Disease Weeks Of Treatment: 22 Clustered Wound: No Photos Wound Measurements Length: (cm) 3 Width: (cm) 2.8 Depth: (cm) 0.3 Area: (cm) 6.597 Volume: (cm) 1.979 % Reduction in Area: -1899.1% % Reduction in Volume:  -1899% Epithelialization: Small (1-33%) Tunneling: No Undermining: No Wound Description Classification: Full Thickness Without Exposed Support Structures Wound Margin: Distinct, outline attached Exudate Amount: Medium Exudate Type: Purulent Exudate Color: yellow, brown, green Foul Odor After Cleansing: No Slough/Fibrino Yes Wound Bed Granulation Amount: Large (67-100%) Exposed Structure Granulation Quality: Red, Friable Fascia Exposed: No Necrotic Amount: Small (1-33%) Fat Layer (Subcutaneous Tissue) Exposed: Yes Necrotic Quality: Adherent Slough Tendon Exposed: No Muscle Exposed: No Joint Exposed: No Bone Exposed: No Periwound Skin Texture Texture Color No Abnormalities Noted: No No Abnormalities Noted: No Excoriation: No Atrophie [name]: No Scarring: No Ecchymosis: No Erythema: No Moisture Hemosiderin Staining: Yes No Abnormalities Noted: Yes Rubor: No Temperature / Pain Temperature: No Abnormality Electronic Signature(s) Signed: 06/28/2022 4:08:25 PM By: [name] East RN Entered By: [name] on 06/28/2022 07:52:28 [name] ([id number]) [id number] Page 5 of 5 -------------------------------------------------------------------------------- Vitals Details Patient Name: Date of Service: [name], [name]  06/28/2022 7:30 A M Medical Record Number: [id number] Patient Account Number: [id number] Date of Birth/Sex: Treating RN: [birth year] (62 y.o. [name] Primary Care Laverle Pillard: [name] Other Clinician: Referring Elizabeth Paulsen: Treating [name] [name]/Extender: [name] in Treatment: 22 Vital Signs Time Taken: 07:40 Reference Range: 80 - 120 mg / dl Height (in): 61 Weight (lbs): 277 Body Mass Index (BMI): 52.3 Electronic Signature(s) Signed: 06/28/2022 4:08:25 PM By: [name] East RN Entered By: [name] on 06/28/2022 07:53:08

## 2022-07-05 ENCOUNTER — Encounter (HOSPITAL_BASED_OUTPATIENT_CLINIC_OR_DEPARTMENT_OTHER): Payer: BC Managed Care – PPO | Admitting: General Surgery

## 2022-07-05 DIAGNOSIS — L97212 Non-pressure chronic ulcer of right calf with fat layer exposed: Secondary | ICD-10-CM | POA: Diagnosis not present

## 2022-07-05 NOTE — Progress Notes (Signed)
[name], [name] ([id number]) [id number] Page 1 of 8 Visit Report for 07/05/2022 Arrival Information Details Patient Name: Date of Service: [name], [name] 07/05/2022 7:30 A M Medical Record Number: [id number] Patient Account Number: [id number] Date of Birth/Sex: Treating RN: 1961/04/02 (62 y.o. [name], [name] Primary Care [name]: [name] Other Clinician: Referring [name]: Treating [name]/Extender: [name] in Treatment: 23 Visit Information History Since Last Visit Added or deleted any medications: No Patient Arrived: Ambulatory Any new allergies or adverse reactions: No Arrival Time: 07:37 Had a fall or experienced change in No Accompanied By: self activities of daily living that may affect Transfer Assistance: None risk of falls: Patient Identification Verified: Yes Signs or symptoms of abuse/neglect since last visito No Secondary Verification Process Completed: Yes Hospitalized since last visit: No Patient Requires Transmission-Based Precautions: No Implantable device outside of the clinic excluding No Patient Has Alerts: No cellular tissue based products placed in the center since last visit: Has Dressing in Place as Prescribed: Yes Has Compression in Place as Prescribed: Yes Pain Present Now: No Electronic Signature(s) Signed: 07/05/2022 1:23:32 PM By: [name] Gouty RN, BSN Entered By: [name] on 07/05/2022 07:38:23 -------------------------------------------------------------------------------- Compression Therapy Details Patient Name: Date of Service: [name], [name] 07/05/2022 7:30 A M Medical Record Number: [id number] Patient Account Number: [id number] Date of Birth/Sex: Treating RN: [birth year] (62 y.o. [name] Primary Care [name]: [name] Other Clinician: Referring Aizik Reh: Treating Cortni Tays/Extender: [name] in  Treatment: 23 Compression Therapy Performed for Wound Assessment: Wound #2 Right,Posterior Lower Leg Performed By: Clinician [name] Gouty, RN Compression Type: Double Layer Post Procedure Diagnosis Same as Pre-procedure Electronic Signature(s) Signed: 07/05/2022 1:23:32 PM By: [name] Gouty RN, BSN Entered By: [name] on 07/05/2022 08:02:13 -------------------------------------------------------------------------------- Encounter Discharge Information Details Patient Name: Date of Service: [name], [name] 07/05/2022 7:30 A M Medical Record Number: [id number] Patient Account Number: [id number] Date of Birth/Sex: Treating RN: 08-19-1960 (62 y.o. [name] Primary Care [name]: [name] Other Clinician: Referring Lillee Mooneyhan: Treating [name]/Extender: [name] in Treatment: 23 Encounter Discharge Information Items Post Procedure Vitals Discharge Condition: Stable Temperature (F): 98 Ambulatory Status: Ambulatory [name] (bpm): 76 Discharge Destination: Home Respiratory Rate (breaths/min): 20 Transportation: Private Auto Blood Pressure (mmHg): 140/88 [name], [name] ([id number]) [phone].pdf Page 2 of 8 Accompanied By: self Schedule Follow-up Appointment: Yes Clinical Summary of Care: Patient Declined Electronic Signature(s) Signed: 07/05/2022 1:23:32 PM By: [name] Gouty RN, BSN Entered By: [name] on 07/05/2022 08:24:05 -------------------------------------------------------------------------------- Lower Extremity Assessment Details Patient Name: Date of Service: [name], [name] 07/05/2022 7:30 A M Medical Record Number: [id number] Patient Account Number: [id number] Date of Birth/Sex: Treating RN: 02/02/1961 (62 y.o. [name] Primary Care [name]: [name] Other Clinician: Referring [name]: Treating [name]/Extender: [name] in Treatment: 23 Edema Assessment Assessed: [Left: No] [Right: No] Edema: [Left: Ye] [Right: s] Calf Left: Right: Point of Measurement: From Medial Instep 46 cm Ankle Left: Right: Point of Measurement: From Medial Instep 23 cm Vascular Assessment Pulses: Dorsalis Pedis Palpable: [Right:No] Electronic Signature(s) Signed: 07/05/2022 1:23:32 PM By: [name] Gouty RN, BSN Entered By: [name] on 07/05/2022 07:45:15 -------------------------------------------------------------------------------- Multi Wound Chart Details Patient Name: Date of Service: [name], [name] 07/05/2022 7:30 A M Medical Record Number: [id number] Patient Account Number: [id number] Date of Birth/Sex: Treating RN: [birth year] (62 y.o. F) Primary Care [name]: [name] Other Clinician: Referring Syann Cupples: Treating [name]/Extender:  [name] in Treatment: 23 Vital Signs Height(in): 61 [name](bpm): 76 Weight(lbs): [id number] Blood Pressure(mmHg): 140/88 Body Mass Index(BMI): 52.3 Temperature(F): 98 Respiratory Rate(breaths/min): 20 [2:Photos:] [N/A:N/A] Right, Posterior Lower Leg N/A N/A Wound Location: Insect Bite N/A N/A Wounding Event: Venous Leg Ulcer N/A N/A Primary Etiology: Anemia, Sleep Apnea, Hypertension, N/A N/A Comorbid History: Peripheral Venous Disease 11/21/2021 N/A N/A Date Acquired: 37 N/A N/A [name] of Treatment: Open N/A N/A Wound Status: No N/A N/A Wound Recurrence: 2.5x2.6x0.1 N/A N/A Measurements L x W x D (cm) 5.105 N/A N/A A (cm) : rea 0.511 N/A N/A Volume (cm) : -1447.00% N/A N/A % Reduction in A rea: -416.20% N/A N/A % Reduction in Volume: Full Thickness Without Exposed N/A N/A Classification: Support Structures Medium N/A N/A Exudate A mount: Serous N/A N/A Exudate Type: amber N/A N/A Exudate Color: Distinct, outline attached N/A N/A Wound Margin: Large (67-100%) N/A N/A Granulation A  mount: Red, Pink N/A N/A Granulation Quality: Small (1-33%) N/A N/A Necrotic A mount: Fat Layer (Subcutaneous Tissue): Yes N/A N/A Exposed Structures: Fascia: No Tendon: No Muscle: No Joint: No Bone: No Small (1-33%) N/A N/A Epithelialization: Debridement - Selective/Open Wound N/A N/A Debridement: Pre-procedure Verification/Time Out 08:00 N/A N/A Taken: Lidocaine 4% Topical Solution N/A N/A Pain Control: Slough N/A N/A Tissue Debrided: Non-Viable Tissue N/A N/A Level: 6.5 N/A N/A Debridement A (sq cm): rea Curette N/A N/A Instrument: Minimum N/A N/A Bleeding: 0 N/A N/A Procedural Pain: 0 N/A N/A Post Procedural Pain: Procedure was tolerated well N/A N/A Debridement Treatment Response: 2.5x2.6x0.1 N/A N/A Post Debridement Measurements L x W x D (cm) 0.511 N/A N/A Post Debridement Volume: (cm) Excoriation: No N/A N/A Periwound Skin Texture: Scarring: No No Abnormalities Noted N/A N/A Periwound Skin Moisture: Hemosiderin Staining: Yes N/A N/A Periwound Skin Color: Atrophie Blanche: No Ecchymosis: No Erythema: No Rubor: No No Abnormality N/A N/A Temperature: Compression Therapy N/A N/A Procedures Performed: Debridement Negative Pressure Wound Therapy Maintenance (NPWT) Treatment Notes Electronic Signature(s) Signed: 07/05/2022 8:09:28 AM By: Fredirick Maudlin MD FACS Entered By: Fredirick Maudlin on 07/05/2022 [id number] -------------------------------------------------------------------------------- Multi-Disciplinary Care Plan Details Patient Name: Date of Service: [place], [name] 07/05/2022 7:30 A M Medical Record Number: [id number] Patient Account Number: [id number] Date of Birth/Sex: Treating RN: November 12, 1960 (62 y.o. [name] Primary Care Dhriti Fales: [name] Other Clinician: Referring Tasheba Henson: Treating Lia Vigilante/Extender: [name] in Treatment: [place] reviewed with  physician [name], [name] ([id number]) [id number] Page 4 of 8 Active Inactive Necrotic Tissue Nursing Diagnoses: Impaired tissue integrity related to necrotic/devitalized tissue Knowledge deficit related to management of necrotic/devitalized tissue Goals: Necrotic/devitalized tissue will be minimized in the wound bed Date Initiated: 01/23/2022 Target Resolution Date: 08/02/2022 Goal Status: Active Patient/caregiver will verbalize understanding of reason and process for debridement of necrotic tissue Date Initiated: 01/23/2022 Target Resolution Date: 08/02/2022 Goal Status: Active Interventions: Assess patient pain level pre-, during and post procedure and prior to discharge Provide education on necrotic tissue and debridement process Treatment Activities: Apply topical anesthetic as ordered : 01/23/2022 Notes: Venous Leg Ulcer Nursing Diagnoses: Knowledge deficit related to disease process and management Potential for venous Insuffiency (use before diagnosis confirmed) Goals: Patient will maintain optimal edema control Date Initiated: 07/05/2022 Target Resolution Date: 08/02/2022 Goal Status: Active Interventions: Assess peripheral edema status every visit. Compression as ordered Provide education on venous insufficiency Treatment Activities: Therapeutic compression applied : 07/05/2022 Notes: Wound/Skin Impairment Nursing Diagnoses: Impaired tissue integrity Knowledge deficit related to ulceration/compromised skin  integrity Goals: Patient/caregiver will verbalize understanding of skin care regimen Date Initiated: 01/23/2022 Target Resolution Date: 08/02/2022 Goal Status: Active Interventions: Assess patient/caregiver ability to obtain necessary supplies Assess ulceration(s) every visit Treatment Activities: Skin care regimen initiated : 01/23/2022 Topical wound management initiated : 01/23/2022 Notes: Electronic Signature(s) Signed:  07/05/2022 1:23:32 PM By: [name] Gouty RN, BSN Entered By: [name] on 07/05/2022 07:53:19 Negative Pressure Wound Therapy Maintenance (NPWT) Details -------------------------------------------------------------------------------- [name] ([id number].pdf Page 5 of 8 Patient Name: Date of Service: [place], [name] 07/05/2022 7:30 A M Medical Record Number: [id number] Patient Account Number: [id number] Date of Birth/Sex: Treating RN: 1960-09-29 (62 y.o. [name] Primary Care Orli Degrave: [name] Other Clinician: Referring Yuya Vanwingerden: Treating Namine Beahm/Extender: [name] in Treatment: 23 NPWT Maintenance Performed for: Wound #2 Right, Posterior Lower Leg Performed By: [name] Gouty, RN Type: Other Coverage Size (sq cm): 6.5 Pressure Type: Constant Pressure Setting: 125 mmHG Drain Type: None Primary Contact: Other : silver collagen Sponge/Dressing Type: Foam- Blue Date Initiated: 05/31/2022 Dressing Removed: Yes Quantity of Sponges/Gauze Removed: 1 Canister Changed: Yes Canister Exudate Volume: 40 Dressing Reapplied: No Quantity of Sponges/Gauze Inserted: 1 Respones T Treatment: o good Days On NPWT : 36 Post Procedure Diagnosis Same as Pre-procedure Electronic Signature(s) Signed: 07/05/2022 1:23:32 PM By: [name] Gouty RN, BSN Entered By: [name] on 07/05/2022 08:04:10 -------------------------------------------------------------------------------- Pain Assessment Details Patient Name: Date of Service: [name], [name] 07/05/2022 7:30 A M Medical Record Number: [id number] Patient Account Number: [id number] Date of Birth/Sex: Treating RN: 1960/04/12 (62 y.o. [name] Primary Care Elsworth Ledin: [name] Other Clinician: Referring Wilfrido Luedke: Treating Dhanush Jokerst/Extender: [name] in Treatment: 23 Active Problems Location  of Pain Severity and Description of Pain Patient Has Paino No Site Locations Rate the pain. Current Pain Level: 0 Pain Management and Medication Current Pain Management: Electronic Signature(s) Signed: 07/05/2022 1:23:32 PM By: [name] Gouty RN, BSN [name], [name] ([id number]) [id number] Page 6 of 8 Entered By: [name] on 07/05/2022 07:41:44 -------------------------------------------------------------------------------- Patient/Caregiver Education Details Patient Name: Date of Service: [place], [name] 3/29/2024andnbsp7:30 [place] Record Number: [id number] Patient Account Number: [id number] Date of Birth/Gender: Treating RN: 03/12/1961 (62 y.o. [name] Primary Care Physician: [name] Other Clinician: Referring Physician: Treating Physician/Extender: [name] in Treatment: 23 Education Assessment Education Provided To: Patient Education Topics Provided Venous: Methods: Explain/Verbal Responses: Reinforcements needed, State content correctly Wound/Skin Impairment: Methods: Explain/Verbal Responses: Reinforcements needed, State content correctly Electronic Signature(s) Signed: 07/05/2022 1:23:32 PM By: [name] Gouty RN, BSN Entered By: [name] on 07/05/2022 07:54:17 -------------------------------------------------------------------------------- Wound Assessment Details Patient Name: Date of Service: [name], [name] 07/05/2022 7:30 A M Medical Record Number: [id number] Patient Account Number: [id number] Date of Birth/Sex: Treating RN: [birth year] (62 y.o. [name] Primary Care Raelene Trew: [name] Other Clinician: Referring Devony Mcgrady: Treating Jonathon Tan/Extender: [name] in Treatment: 23 Wound Status Wound Number: 2 Primary Venous Leg Ulcer Etiology: Wound Location: Right, Posterior Lower Leg Wound Status:  Open Wounding Event: Insect Bite Comorbid Anemia, Sleep Apnea, Hypertension, Peripheral Venous Date Acquired: 11/21/2021 History: Disease [name] Of Treatment: 23 Clustered Wound: No Photos Wound Measurements Length: (cm) 2.5 Width: (cm) 2.6 Depth: (cm) 0.1 Area: (cm) 5.105 [name], [name] ([id number]) Volume: (cm) 0.511 % Reduction in Area: -1447% % Reduction in Volume: -416.2% Epithelialization: Small (1-33%) Tunneling: No [phone].pdf Page 7 of 8 Undermining: No Wound Description Classification: Full Thickness  Without Exposed Support Structures Wound Margin: Distinct, outline attached Exudate Amount: Medium Exudate Type: Serous Exudate Color: amber Foul Odor After Cleansing: No Slough/Fibrino Yes Wound Bed Granulation Amount: Large (67-100%) Exposed Structure Granulation Quality: Red, Pink Fascia Exposed: No Necrotic Amount: Small (1-33%) Fat Layer (Subcutaneous Tissue) Exposed: Yes Necrotic Quality: Adherent Slough Tendon Exposed: No Muscle Exposed: No Joint Exposed: No Bone Exposed: No Periwound Skin Texture Texture Color No Abnormalities Noted: Yes No Abnormalities Noted: No Atrophie Blanche: No Moisture Ecchymosis: No No Abnormalities Noted: Yes Erythema: No Hemosiderin Staining: Yes Rubor: No Temperature / Pain Temperature: No Abnormality Treatment Notes Wound #2 (Lower Leg) Wound Laterality: Right, Posterior Cleanser Soap and Water Discharge Instruction: May shower and wash wound with dial antibacterial soap and water prior to dressing change. Wound Cleanser Discharge Instruction: Cleanse the wound with wound cleanser prior to applying a clean dressing using gauze sponges, not tissue or cotton balls. Peri-Wound Care Sween Lotion (Moisturizing lotion) Discharge Instruction: Apply moisturizing lotion as directed Topical Primary Dressing Promogran Prisma Matrix, 4.34 (sq in) (silver collagen) Discharge Instruction:  Moisten collagen with saline or hydrogel SNAP VAC Secondary Dressing Secured With Compression Wrap Urgo K2 Lite, two layer compression system, regular Discharge Instruction: Apply Urgo K2 Lite as directed (alternative to 3 layer compression). Compression Stockings Add-Ons Electronic Signature(s) Signed: 07/05/2022 1:23:32 PM By: [name] Gouty RN, BSN Entered By: [name] on 07/05/2022 07:50:53 -------------------------------------------------------------------------------- [place] Details Patient Name: Date of Service: [name], [name] 07/05/2022 7:30 A M Medical Record Number: [id number] Patient Account Number: [id number] Date of Birth/Sex: Treating RN: Jan 18, 1961 (62 y.o. [name] Primary Care Fernanda Twaddell: [name] Other Clinician: Rae [name] ([id number]) [id number] Page 8 of 8 Referring Solara Goodchild: Treating Olis Viverette/Extender: [name] in Treatment: 23 Vital Signs Time Taken: 07:40 Temperature (F): 98 Height (in): 61 [name] (bpm): 76 Weight (lbs): 277 Respiratory Rate (breaths/min): 20 Body Mass Index (BMI): 52.3 Blood Pressure (mmHg): 140/88 Reference Range: 80 - 120 mg / dl Electronic Signature(s) Signed: 07/05/2022 1:23:32 PM By: [name] Gouty RN, BSN Entered By: [name] on 07/05/2022 07:41:35

## 2022-07-05 NOTE — Progress Notes (Signed)
[name], [name] ([name]) [id number] Page 1 of 11 Visit Report for 07/05/2022 Chief Complaint Document Details Patient Name: Date of Service: [place], [name] 07/05/2022 7:30 A M Medical Record Number: [name] Patient Account Number: [id number] Date of Birth/Sex: Treating RN: 1960-05-03 (62 y.o. F) Primary Care Provider: Loralyn [name] Other Clinician: Referring Provider: Treating Provider/Extender: [name] in Treatment: 23 Information Obtained from: Patient Chief Complaint RLE ulcer Electronic Signature(s) Signed: 07/05/2022 8:09:36 AM By: [name] Maudlin MD FACS Entered By: [name] on 07/05/2022 08:09:36 -------------------------------------------------------------------------------- Debridement Details Patient Name: Date of Service: [name], [name] [name] H. 07/05/2022 7:30 A M Medical Record Number: [name] Patient Account Number: [id number] Date of Birth/Sex: Treating RN: 1961-01-28 (62 y.o. [name] Primary Care Provider: Loralyn [name] Other Clinician: Referring Provider: Treating Provider/Extender: [name] in Treatment: 23 Debridement Performed for Assessment: Wound #2 Right,Posterior Lower Leg Performed By: Physician [name] Maudlin, MD Debridement Type: Debridement Severity of Tissue Pre Debridement: Fat layer exposed Level of Consciousness (Pre-procedure): Awake and Alert Pre-procedure Verification/Time Out Yes - 08:00 Taken: Start Time: 08:00 Pain Control: Lidocaine 4% T opical Solution T Area Debrided (L x W): otal 2.5 (cm) x 2.6 (cm) = 6.5 (cm) Tissue and other material debrided: Non-Viable, Slough, Slough Level: Non-Viable Tissue Debridement Description: Selective/Open Wound Instrument: Curette Bleeding: Minimum Procedural Pain: 0 Post Procedural Pain: 0 Response to Treatment: Procedure was tolerated well Level of Consciousness (Post-  Awake and Alert procedure): Post Debridement Measurements of Total Wound Length: (cm) 2.5 Width: (cm) 2.6 Depth: (cm) 0.1 Volume: (cm) 0.511 Character of Wound/Ulcer Post Debridement: Improved Severity of Tissue Post Debridement: Fat layer exposed Post Procedure Diagnosis Same as Pre-procedure Notes Scribed for Dr. Celine [name] by [name] Gouty, RN Electronic Signature(s) Signed: 07/05/2022 8:14:24 AM By: [name] Maudlin MD FACS Signed: 07/05/2022 1:23:32 PM By: [name] Gouty RN, BSN [name], [name] ([name]) [phone].pdf Page 2 of 11 Entered By: [name] on 07/05/2022 08:01:52 -------------------------------------------------------------------------------- HPI Details Patient Name: Date of Service: [name] 07/05/2022 7:30 A M Medical Record Number: [name] Patient Account Number: [id number] Date of Birth/Sex: Treating RN: 05/05/1960 (62 y.o. F) Primary Care Provider: Loralyn [name] Other Clinician: Referring Provider: Treating Provider/Extender: [name] in Treatment: 23 History of Present Illness HPI Description: 02/16/2020 upon evaluation today patient actually appears to be doing poorly in regard to her left medial lower extremity ulcer. This is actually an area that she tells me she has had intermittent issues with over the years although has been closed for some time she typically uses compression right now she has juxta lite compression wraps. With that being said she tells me that this nonetheless open several weeks/months ago and has been given her trouble since. She does have a history of chronic venous insufficiency she is seeing specialist for this in the past she has had an ablation as well as sclerotherapy. With that being said she also has hypertension chronically which is managed by her primary care provider. In general she seems to be worsening overall with regard to the wound and  states that she finally realized that she needed to come in and have somebody look at this and not continue to try to manage this on her own. No fevers, chills, nausea, vomiting, or diarrhea. 02/23/2020 on evaluation today patient appears to be doing well with regard to her wound. This is showing some signs of improvement which is great news still were not quite at  the point where I would like to be as far as the overall appearance of the wound is concerned but I do believe this is better than last week. I do believe the Iodoflex is helping as well. 03/08/2020 upon evaluation today patient appears to be doing well with regard to her wound. She has been tolerating the dressing changes without complication. Fortunately I feel like she has made great progress with the Iodoflex but I feel like it may be the point rest to switch to something else possibly a collagen- based dressing at this time. 03/15/2020 upon evaluation today patient appears to be doing excellent in regard to her leg ulcer. She has been tolerating the dressing changes without complication. Fortunately there is no signs of active infection. Overall she is measuring a little bit smaller today which is great news. 03/22/2020 upon evaluation today patient appears to be doing well with regard to her wound. She has been tolerating the dressing changes without complication. Fortunately there is no signs of active infection at this time. 03/28/2020; patient I do not usually see however she has a wound on the left anterior lower leg secondary to chronic venous insufficiency we have been using silver collagen under compression. She arrives in clinic with a nonviable surface requiring debridement 04/12/2020 upon evaluation today patient appears to be doing well all things considered with regard to her leg ulcer. She is tolerating the dressing changes without complication there is minimal dry skin around the edges of the wound that may be trapping and  stopping some of the events of the new skin I am can work on that today. Otherwise the surface of the wound appears to be doing excellent. 04/19/2020 upon evaluation today patient appears to be doing well with regard to her leg ulcer. She has been tolerating dressing changes without complication. Fortunately there is no signs of active infection at this time. No fever chills noted overall very pleased with how things seem to be progressing. 04/26/2020 on evaluation today patient appears to be doing well with regard to her wound currently. Is showing signs of excellent improvement overall is filling in nicely and there does not appear to be any signs of infection. No fevers, chills, nausea, vomiting, or diarrhea. 05/03/2020 upon evaluation today patient appears to be doing well with regard to her wound on the leg. This overall showing signs of good improvement which is great she has some good epithelial growth and overall I think that things are moving in the correct direction. We likewise going to continue with the wound care measures as before since she seems to making such good improvement. 2/3; venous wound on the left medial leg. This is contracting. We are using Prisma and 3 layer compression. She has a stocking and waiting in the eventuality this heals. She is already using it on the right 05/17/2020 upon evaluation today patient appears to be doing well with regard to her leg ulcer. She has been tolerating the dressing changes without complication. Fortunately there is no signs of active infection which is great news and overall very pleased with where things stand today. No fevers, chills, nausea, vomiting, or diarrhea. 05/24/2020 upon evaluation today patient appears to be doing well with regard to her wound. Overall I feel like she is making excellent progress. There does not appear to be any signs of active infection which is great news. 05/31/2020 upon evaluation today patient appears to be  doing well with regard to her wound. There does not appear  to be any signs of active infection which is great news overall I am extremely pleased with where things stand today. READMISSION 01/23/2022 She returns to clinic today with a new wound on her right posterior calf. She says that she was cleaning out an old shed near the middle of August this year and then noticed what seemed to be a bug bite on her right posterior calf. It was itchy, red, and raised. By the end of September, an ulcer had developed. She has been applying various topical creams such as hydrocortisone and others to the site. When it was not improving, she made an appointment in the wound care center. ABI in clinic today was 0.97. On her right posterior calf, there is a circular wound with necrotic fat and black eschar present. There is no purulent drainage or malodor. The periwound skin is in good condition with just a little induration that appears to be secondary to inflammation. 01/31/2022: The wound measures a little bit larger today, but overall is quite a bit cleaner. There is some undermining from 9:00 to 3:00. She is having some periwound itching and says that her wrap slid. 02/08/2022: Despite using an Unna boot first layer at the top of her wrap, it slid again and it looks like there is been some bruising at the wound site. The wound is about the same size in terms of dimensions. There is a fair amount of slough and nonviable tissue still present. Edema control is better than last week. 02/15/2022: The wound dimensions are about the same. There is less nonviable tissue present. The periwound erythema has improved. 02/22/2022: The wound has deteriorated over the past week. It is larger and the periwound is more edematous, erythematous, and indurated. It looks as though there has been more tissue breakdown with undermining present. She is having more pain. There is a foul odor coming from the wound. 02/26/2022: The wound  looks quite a bit better today and the odor is gone. I still do not have her culture data back, but she seems to be responding well to the Augmentin. [name], [name] ([id number]) [id number] Page 3 of 11 03/06/2022: Her wound continues to improve. There is still some slough accumulation, but the periwound skin is less inflamed. Her culture returned with a polymicrobial population including Pseudomonas and so levofloxacin was also prescribed. She did not understand why a second antibiotic was being added so she has not yet initiated this. 03/14/2022: The wound is about the same, to perhaps a little bit larger. There is a fair amount of slough accumulation on the surface, as well as some hypertrophic granulation tissue. She is still taking levofloxacin, but has completed taking Augmentin. She has her [employer] compound with her today. 12/14; the patient has a significant circular wound on the posterior right calf. She is using [employer] and silver alginate under 3 layer compression. Her ABIs were within normal limits at 0.97. This may have been traumatic or an insect bite at the start I reviewed these records. 04/04/2022: Since I last saw the wound, it has contracted considerably. There is a fairly thick layer of slough on the surface, as it has not been debrided since the last time I did it. Edema control is excellent and the periwound skin is in much better condition. 04/12/2022: No significant change in the wound dimensions. It is filling with granulation tissue. Still with slough accumulation on the surface. 04/19/2022: The wound is smaller this week. There is some slough accumulation on the  surface. 04/26/2022: The wound is smaller again this week and significantly cleaner. The wound surface is a little bit drier than ideal. 05/03/2022: The wound measurements were about the same, but visually it appears smaller. There is still some undermining at the top of the wound.  Moisture balance is better this week. 05/10/2022: The wound measured smaller today. There is still a fair amount of undermining present. Slough has built up on the surface. We are still awaiting snap VAC approval. 05/17/2022: The wound is a little bit smaller today. There is less slough on the surface, but the granulation tissue still is not very robust. She has been approved for snap VAC but will have a 20% coinsurance and it is not clear what that amount would end up being for her. 05/27/2022: The surface of the wound has deteriorated and it is gray and fibrotic. No significant odor, but the drainage on her dressing was a little bit purulent. 05/31/2022: The wound looks quite a bit better today. It is still a little fibrotic but no longer has purulent-looking drainage. The color is better. She spoke with her insurance company and is interested in trying the snap VAC, now that she is aware of the cost to her. 06/07/2022: After 1 week in the snap VAC, there has been substantial improvement to her wound. The undermined portion is closing in. The surface has a healthier color and appearance. There is very minimal slough accumulation. 06/14/2022: The more shallow part of the undermined portion of her wound has closed. There is still some undermining from about 1-2 o'clock. The surface continues to improve. Minimal slough accumulation. 06/28/2022: The wound is shallower this week and the undermining has essentially closed and completely. The surface tissue is improving. 07/05/2022: The depth is almost immeasurable and the surface is nearly flush with the surrounding skin. The undermining has been eliminated. There is light slough on the wound surface. Electronic Signature(s) Signed: 07/05/2022 8:10:14 AM By: [name] Maudlin MD FACS Entered By: [name] on 07/05/2022 08:10:14 -------------------------------------------------------------------------------- Physical Exam Details Patient Name: Date of  Service: [name], [name] [name] H. 07/05/2022 7:30 A M Medical Record Number: [name] Patient Account Number: [id number] Date of Birth/Sex: Treating RN: 1960-11-12 (62 y.o. F) Primary Care Provider: Loralyn [name] Other Clinician: Referring Provider: Treating Provider/Extender: [name] in Treatment: 23 Constitutional . . . . no acute distress. Respiratory Normal work of breathing on room air. Notes 07/05/2022: The depth is almost immeasurable and the surface is nearly flush with the surrounding skin. The undermining has been eliminated. There is light slough on the wound surface. Electronic Signature(s) Signed: 07/05/2022 8:10:48 AM By: [name] Maudlin MD FACS Entered By: [name] on 07/05/2022 08:10:48 -------------------------------------------------------------------------------- Physician Orders Details Patient Name: Date of Service: [address], [name] [name] H. 07/05/2022 7:30 A Erline Levine ([id number] Page 4 of 11 Medical Record Number: [name] Patient Account Number: [id number] Date of Birth/Sex: Treating RN: [birth year] (62 y.o. [name] Primary Care Provider: Loralyn [name] Other Clinician: Referring Provider: Treating Provider/Extender: [name] in Treatment: 23 Verbal / Phone Orders: No Diagnosis Coding ICD-10 Coding Code Description [phone] Non-pressure chronic ulcer of right calf with fat layer exposed E66.01 Morbid (severe) obesity due to excess calories I10 Essential (primary) hypertension R60.0 Localized edema Follow-up Appointments ppointment in 1 week. - Dr. Celine [name] RM Return A Friday 4/5 @ 2:45 pm Anesthetic (In clinic) Topical Lidocaine 4% applied to wound bed Bathing/ Shower/ Hygiene May shower with protection  but do not get wound dressing(s) wet. Protect dressing(s) with water repellant cover (for example, large plastic bag) or a  cast cover and may then take shower. Negative Presssure Wound Therapy Wound #2 Right,Posterior Lower Leg SNAP Vac to wound continuously at 15mm/hg pressure Edema Control - Lymphedema / SCD / Other Bilateral Lower Extremities Elevate legs to the level of the heart or above for 30 minutes daily and/or when sitting for 3-4 times a day throughout the day. Avoid standing for long periods of time. Exercise regularly Moisturize legs daily. Compression stocking or Garment 20-30 mm/Hg pressure to: - left leg daily. Apply first thing in the morning, remove at night. Wound Treatment Wound #2 - Lower Leg Wound Laterality: Right, Posterior Cleanser: Soap and Water 1 x Per Week/30 Days Discharge Instructions: May shower and wash wound with dial antibacterial soap and water prior to dressing change. Cleanser: Wound Cleanser 1 x Per Week/30 Days Discharge Instructions: Cleanse the wound with wound cleanser prior to applying a clean dressing using gauze sponges, not tissue or cotton balls. Peri-Wound Care: Sween Lotion (Moisturizing lotion) 1 x Per Week/30 Days Discharge Instructions: Apply moisturizing lotion as directed Prim Dressing: Promogran Prisma Matrix, 4.34 (sq in) (silver collagen) 1 x Per Week/30 Days ary Discharge Instructions: Moisten collagen with saline or hydrogel Prim Dressing: SNAP VAC ary 1 x Per Week/30 Days Compression Wrap: Urgo K2 Lite, two layer compression system, regular 1 x Per Week/30 Days Discharge Instructions: Apply Urgo K2 Lite as directed (alternative to 3 layer compression). Electronic Signature(s) Signed: 07/05/2022 8:14:24 AM By: [name] Maudlin MD FACS Entered By: [name] on 07/05/2022 08:11:06 -------------------------------------------------------------------------------- Problem List Details Patient Name: Date of Service: [name], [name] [name] H. 07/05/2022 7:30 A M Medical Record Number: [id number] Patient Account Number: [id number] Date of Birth/Sex:  Treating RN: 03/07/1961 (62 y.o. [name] Primary Care Provider: Loralyn [name] Other Clinician: Referring Provider: Treating Provider/Extender: [name] in Treatment: [address] [place] ([id number]) [id number] Page 5 of 11 Active Problems ICD-10 Encounter Code Description Active Date MDM Diagnosis L97.212 Non-pressure chronic ulcer of right calf with fat layer exposed 01/23/2022 No Yes E66.01 Morbid (severe) obesity due to excess calories 01/23/2022 No Yes I10 Essential (primary) hypertension 01/23/2022 No Yes R60.0 Localized edema 01/23/2022 No Yes Inactive Problems Resolved Problems Electronic Signature(s) Signed: 07/05/2022 8:05:59 AM By: [name] Maudlin MD FACS Entered By: [name] on 07/05/2022 08:05:59 -------------------------------------------------------------------------------- Progress Note Details Patient Name: Date of Service: [name], [name] [name] H. 07/05/2022 7:30 A M Medical Record Number: [id number] Patient Account Number: [id number] Date of Birth/Sex: Treating RN: [birth year] (62 y.o. F) Primary Care Provider: Loralyn [name] Other Clinician: Referring Provider: Treating Provider/Extender: [name] in Treatment: 23 Subjective Chief Complaint Information obtained from Patient RLE ulcer History of Present Illness (HPI) 02/16/2020 upon evaluation today patient actually appears to be doing poorly in regard to her left medial lower extremity ulcer. This is actually an area that she tells me she has had intermittent issues with over the years although has been closed for some time she typically uses compression right now she has juxta lite compression wraps. With that being said she tells me that this nonetheless open several weeks/months ago and has been given her trouble since. She does have a history of chronic venous insufficiency she is seeing specialist  for this in the past she has had an ablation as well as sclerotherapy. With that being said she also has hypertension chronically which is  managed by her primary care provider. In general she seems to be worsening overall with regard to the wound and states that she finally realized that she needed to come in and have somebody look at this and not continue to try to manage this on her own. No fevers, chills, nausea, vomiting, or diarrhea. 02/23/2020 on evaluation today patient appears to be doing well with regard to her wound. This is showing some signs of improvement which is great news still were not quite at the point where I would like to be as far as the overall appearance of the wound is concerned but I do believe this is better than last week. I do believe the Iodoflex is helping as well. 03/08/2020 upon evaluation today patient appears to be doing well with regard to her wound. She has been tolerating the dressing changes without complication. Fortunately I feel like she has made great progress with the Iodoflex but I feel like it may be the point rest to switch to something else possibly a collagen- based dressing at this time. 03/15/2020 upon evaluation today patient appears to be doing excellent in regard to her leg ulcer. She has been tolerating the dressing changes without complication. Fortunately there is no signs of active infection. Overall she is measuring a little bit smaller today which is great news. 03/22/2020 upon evaluation today patient appears to be doing well with regard to her wound. She has been tolerating the dressing changes without complication. Fortunately there is no signs of active infection at this time. 03/28/2020; patient I do not usually see however she has a wound on the left anterior lower leg secondary to chronic venous insufficiency we have been using silver collagen under compression. She arrives in clinic with a nonviable surface requiring  debridement 04/12/2020 upon evaluation today patient appears to be doing well all things considered with regard to her leg ulcer. She is tolerating the dressing changes without complication there is minimal dry skin around the edges of the wound that may be trapping and stopping some of the events of the new skin I am can work on that today. Otherwise the surface of the wound appears to be doing excellent. [name], [name] ([id number]) [id number] Page 6 of 11 04/19/2020 upon evaluation today patient appears to be doing well with regard to her leg ulcer. She has been tolerating dressing changes without complication. Fortunately there is no signs of active infection at this time. No fever chills noted overall very pleased with how things seem to be progressing. 04/26/2020 on evaluation today patient appears to be doing well with regard to her wound currently. Is showing signs of excellent improvement overall is filling in nicely and there does not appear to be any signs of infection. No fevers, chills, nausea, vomiting, or diarrhea. 05/03/2020 upon evaluation today patient appears to be doing well with regard to her wound on the leg. This overall showing signs of good improvement which is great she has some good epithelial growth and overall I think that things are moving in the correct direction. We likewise going to continue with the wound care measures as before since she seems to making such good improvement. 2/3; venous wound on the left medial leg. This is contracting. We are using Prisma and 3 layer compression. She has a stocking and waiting in the eventuality this heals. She is already using it on the right 05/17/2020 upon evaluation today patient appears to be doing well with regard to her leg ulcer. She  has been tolerating the dressing changes without complication. Fortunately there is no signs of active infection which is great news and overall very pleased with where  things stand today. No fevers, chills, nausea, vomiting, or diarrhea. 05/24/2020 upon evaluation today patient appears to be doing well with regard to her wound. Overall I feel like she is making excellent progress. There does not appear to be any signs of active infection which is great news. 05/31/2020 upon evaluation today patient appears to be doing well with regard to her wound. There does not appear to be any signs of active infection which is great news overall I am extremely pleased with where things stand today. READMISSION 01/23/2022 She returns to clinic today with a new wound on her right posterior calf. She says that she was cleaning out an old shed near the middle of August this year and then noticed what seemed to be a bug bite on her right posterior calf. It was itchy, red, and raised. By the end of September, an ulcer had developed. She has been applying various topical creams such as hydrocortisone and others to the site. When it was not improving, she made an appointment in the wound care center. ABI in clinic today was 0.97. On her right posterior calf, there is a circular wound with necrotic fat and black eschar present. There is no purulent drainage or malodor. The periwound skin is in good condition with just a little induration that appears to be secondary to inflammation. 01/31/2022: The wound measures a little bit larger today, but overall is quite a bit cleaner. There is some undermining from 9:00 to 3:00. She is having some periwound itching and says that her wrap slid. 02/08/2022: Despite using an Unna boot first layer at the top of her wrap, it slid again and it looks like there is been some bruising at the wound site. The wound is about the same size in terms of dimensions. There is a fair amount of slough and nonviable tissue still present. Edema control is better than last week. 02/15/2022: The wound dimensions are about the same. There is less nonviable tissue present.  The periwound erythema has improved. 02/22/2022: The wound has deteriorated over the past week. It is larger and the periwound is more edematous, erythematous, and indurated. It looks as though there has been more tissue breakdown with undermining present. She is having more pain. There is a foul odor coming from the wound. 02/26/2022: The wound looks quite a bit better today and the odor is gone. I still do not have her culture data back, but she seems to be responding well to the Augmentin. 03/06/2022: Her wound continues to improve. There is still some slough accumulation, but the periwound skin is less inflamed. Her culture returned with a polymicrobial population including Pseudomonas and so levofloxacin was also prescribed. She did not understand why a second antibiotic was being added so she has not yet initiated this. 03/14/2022: The wound is about the same, to perhaps a little bit larger. There is a fair amount of slough accumulation on the surface, as well as some hypertrophic granulation tissue. She is still taking levofloxacin, but has completed taking Augmentin. She has her [employer] compound with her today. 12/14; the patient has a significant circular wound on the posterior right calf. She is using [employer] and silver alginate under 3 layer compression. Her ABIs were within normal limits at 0.97. This may have been traumatic or an insect bite at the start I  reviewed these records. 04/04/2022: Since I last saw the wound, it has contracted considerably. There is a fairly thick layer of slough on the surface, as it has not been debrided since the last time I did it. Edema control is excellent and the periwound skin is in much better condition. 04/12/2022: No significant change in the wound dimensions. It is filling with granulation tissue. Still with slough accumulation on the surface. 04/19/2022: The wound is smaller this week. There is some slough accumulation on the surface. 04/26/2022: The  wound is smaller again this week and significantly cleaner. The wound surface is a little bit drier than ideal. 05/03/2022: The wound measurements were about the same, but visually it appears smaller. There is still some undermining at the top of the wound. Moisture balance is better this week. 05/10/2022: The wound measured smaller today. There is still a fair amount of undermining present. Slough has built up on the surface. We are still awaiting snap VAC approval. 05/17/2022: The wound is a little bit smaller today. There is less slough on the surface, but the granulation tissue still is not very robust. She has been approved for snap VAC but will have a 20% coinsurance and it is not clear what that amount would end up being for her. 05/27/2022: The surface of the wound has deteriorated and it is gray and fibrotic. No significant odor, but the drainage on her dressing was a little bit purulent. 05/31/2022: The wound looks quite a bit better today. It is still a little fibrotic but no longer has purulent-looking drainage. The color is better. She spoke with her insurance company and is interested in trying the snap VAC, now that she is aware of the cost to her. 06/07/2022: After 1 week in the snap VAC, there has been substantial improvement to her wound. The undermined portion is closing in. The surface has a healthier color and appearance. There is very minimal slough accumulation. 06/14/2022: The more shallow part of the undermined portion of her wound has closed. There is still some undermining from about 1-2 o'clock. The surface continues to improve. Minimal slough accumulation. 06/28/2022: The wound is shallower this week and the undermining has essentially closed and completely. The surface tissue is improving. 07/05/2022: The depth is almost immeasurable and the surface is nearly flush with the surrounding skin. The undermining has been eliminated. There is light slough on the wound surface. Patient  History Information obtained from Patient. [name], [name] ([id number]) [id number] Page 7 of 11 Family History Cancer - Mother, Diabetes - Father, Hypertension - Mother, Kidney Disease - Mother, Lung Disease - Father, Thyroid Problems - Paternal Grandparents, No family history of Heart Disease, Seizures, Stroke, Tuberculosis. Social History Never smoker, Marital Status - Married, Alcohol Use - Never, Drug Use - No History, Caffeine Use - Daily - coffee. Medical History Eyes Denies history of Cataracts, Glaucoma, Optic Neuritis Ear/Nose/Mouth/Throat Denies history of Chronic sinus problems/congestion, Middle ear problems Hematologic/Lymphatic Patient has history of Anemia - iron Denies history of Hemophilia, Human Immunodeficiency Virus, Lymphedema, Sickle Cell Disease Respiratory Patient has history of Sleep Apnea - CPAP Denies history of Aspiration, Asthma, Chronic Obstructive Pulmonary Disease (COPD), Pneumothorax, Tuberculosis Cardiovascular Patient has history of Hypertension, Peripheral Venous Disease Denies history of Angina, Arrhythmia, Congestive Heart Failure, Coronary Artery Disease, Deep Vein Thrombosis, Hypotension, Myocardial Infarction, Peripheral Arterial Disease, Phlebitis, Vasculitis Gastrointestinal Denies history of Cirrhosis , Colitis, Crohnoos, Hepatitis A, Hepatitis B, Hepatitis C Endocrine Denies history of Type I Diabetes, Type II Diabetes  Genitourinary Denies history of End Stage Renal Disease Immunological Denies history of Lupus Erythematosus, Raynaudoos, Scleroderma Integumentary (Skin) Denies history of History of Burn Musculoskeletal Denies history of Gout, Rheumatoid Arthritis, Osteoarthritis, Osteomyelitis Neurologic Denies history of Dementia, Neuropathy, Quadriplegia, Paraplegia, Seizure Disorder Oncologic Denies history of Received Chemotherapy, Received Radiation Psychiatric Denies history of Anorexia/bulimia,  Confinement Anxiety Hospitalization/Surgery History - cholecystectomy 1980s. - nephrolithasis 1980s. - plate and rod in right elbow surgery 2010. 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 Vitals Time Taken: 7:40 AM, Height: 61 in, Weight: 277 lbs, BMI: 52.3, Temperature: 98 F, Pulse: 76 bpm, Respiratory Rate: 20 breaths/min, Blood Pressure: 140/88 mmHg. Respiratory Normal work of breathing on room air. General Notes: 07/05/2022: The depth is almost immeasurable and the surface is nearly flush with the surrounding skin. The undermining has been eliminated. There is light slough on the wound surface. Integumentary (Hair, Skin) Wound #2 status is Open. Original cause of wound was Insect Bite. The date acquired was: 11/21/2021. The wound has been in treatment 23 weeks. The wound is located on the Right,Posterior Lower Leg. The wound measures 2.5cm length x 2.6cm width x 0.1cm depth; 5.105cm^2 area and 0.511cm^3 volume. There is Fat Layer (Subcutaneous Tissue) exposed. There is no tunneling or undermining noted. There is a medium amount of serous drainage noted. The wound margin is distinct with the outline attached to the wound base. There is large (67-100%) red, pink granulation within the wound bed. There is a small (1-33%) amount of necrotic tissue within the wound bed including Adherent Slough. The periwound skin appearance had no abnormalities noted for texture. The periwound skin appearance had no abnormalities noted for moisture. The periwound skin appearance exhibited: Hemosiderin Staining. The periwound skin appearance did not exhibit: Atrophie Blanche, Ecchymosis, Rubor, Erythema. Periwound temperature was noted as No Abnormality. Assessment [name], [name] ([id number]) [id number] Page 8 of 11 Active Problems ICD-10 Non-pressure  chronic ulcer of right calf with fat layer exposed Morbid (severe) obesity due to excess calories Essential (primary) hypertension Localized edema Procedures Wound #2 Pre-procedure diagnosis of Wound #2 is a Venous Leg Ulcer located on the Right,Posterior Lower Leg .Severity of Tissue Pre Debridement is: Fat layer exposed. There was a Selective/Open Wound Non-Viable Tissue Debridement with a total area of 6.5 sq cm performed by [name] Maudlin, MD. With the following instrument(s): Curette to remove Non-Viable tissue/material. Material removed includes [facility] after achieving pain control using Lidocaine 4% Topical Solution. No specimens were taken. A time out was conducted at 08:00, prior to the start of the procedure. A Minimum amount of bleeding was controlled with N/A. The procedure was tolerated well with a pain level of 0 throughout and a pain level of 0 following the procedure. Post Debridement Measurements: 2.5cm length x 2.6cm width x 0.1cm depth; 0.511cm^3 volume. Character of Wound/Ulcer Post Debridement is improved. Severity of Tissue Post Debridement is: Fat layer exposed. Post procedure Diagnosis Wound #2: Same as Pre-Procedure General Notes: Scribed for Dr. Celine [name] by [name] Gouty, RN. Pre-procedure diagnosis of Wound #2 is a Venous Leg Ulcer located on the Right,Posterior Lower Leg . There was a Double Layer Compression Therapy Procedure by [name] Gouty, RN. Post procedure Diagnosis Wound #2: Same as Pre-Procedure Plan Follow-up Appointments: Return Appointment in 1 week. - Dr. Celine [name] RM Friday 4/5 @ 2:45 pm Anesthetic: (In clinic) Topical Lidocaine 4% applied to wound bed Bathing/ Shower/ Hygiene: May shower with  protection but do not get wound dressing(s) wet. Protect dressing(s) with water repellant cover (for example, large plastic bag) or a cast cover and may then take shower. Negative Presssure Wound Therapy: Wound #2 Right,Posterior Lower Leg: SNAP Vac to wound  continuously at 117mm/hg pressure Edema Control - Lymphedema / SCD / Other: Elevate legs to the level of the heart or above for 30 minutes daily and/or when sitting for 3-4 times a day throughout the day. Avoid standing for long periods of time. Exercise regularly Moisturize legs daily. Compression stocking or Garment 20-30 mm/Hg pressure to: - left leg daily. Apply first thing in the morning, remove at night. WOUND #2: - Lower Leg Wound Laterality: Right, Posterior Cleanser: Soap and Water 1 x Per Week/30 Days Discharge Instructions: May shower and wash wound with dial antibacterial soap and water prior to dressing change. Cleanser: Wound Cleanser 1 x Per Week/30 Days Discharge Instructions: Cleanse the wound with wound cleanser prior to applying a clean dressing using gauze sponges, not tissue or cotton balls. Peri-Wound Care: Sween Lotion (Moisturizing lotion) 1 x Per Week/30 Days Discharge Instructions: Apply moisturizing lotion as directed Prim Dressing: Promogran Prisma Matrix, 4.34 (sq in) (silver collagen) 1 x Per Week/30 Days ary Discharge Instructions: Moisten collagen with saline or hydrogel Prim Dressing: SNAP VAC 1 x Per Week/30 Days ary Com pression Wrap: Urgo K2 Lite, two layer compression system, regular 1 x Per Week/30 Days Discharge Instructions: Apply Urgo K2 Lite as directed (alternative to 3 layer compression). 07/05/2022: The depth is almost immeasurable and the surface is nearly flush with the surrounding skin. The undermining has been eliminated. There is light slough on the wound surface. I used a curette to debride the slough off of the wound surface. We will continue Prisma silver collagen and negative pressure wound therapy via the snap VAC. Continue 3 layer compression. Follow-up in 1 week. Electronic Signature(s) Signed: 07/05/2022 8:11:44 AM By: [name] Maudlin MD FACS Entered By: [name] on 07/05/2022 08:11:44 HxROS  Details -------------------------------------------------------------------------------- [name] ([id number] Page 9 of 11 Patient Name: Date of Service: [place], [name] [name] H. 07/05/2022 7:30 A M Medical Record Number: [name] Patient Account Number: [id number] Date of Birth/Sex: Treating RN: January 06, 1961 (62 y.o. F) Primary Care Provider: Loralyn [name] Other Clinician: Referring Provider: Treating Provider/Extender: [name] in Treatment: 23 Information Obtained From Patient Eyes Medical History: Negative for: Cataracts; Glaucoma; Optic Neuritis Ear/Nose/Mouth/Throat Medical History: Negative for: Chronic sinus problems/congestion; Middle ear problems Hematologic/Lymphatic Medical History: Positive for: Anemia - iron Negative for: Hemophilia; Human Immunodeficiency Virus; Lymphedema; Sickle Cell Disease Respiratory Medical History: Positive for: Sleep Apnea - CPAP Negative for: Aspiration; Asthma; Chronic Obstructive Pulmonary Disease (COPD); Pneumothorax; Tuberculosis Cardiovascular Medical History: Positive for: Hypertension; Peripheral Venous Disease Negative for: Angina; Arrhythmia; Congestive Heart Failure; Coronary Artery Disease; Deep Vein Thrombosis; Hypotension; Myocardial Infarction; Peripheral Arterial Disease; Phlebitis; Vasculitis Gastrointestinal Medical History: Negative for: Cirrhosis ; Colitis; Crohns; Hepatitis A; Hepatitis B; Hepatitis C Endocrine Medical History: Negative for: Type I Diabetes; Type II Diabetes Genitourinary Medical History: Negative for: End Stage Renal Disease Past Medical History Notes: years ago kidney stones Immunological Medical History: Negative for: Lupus Erythematosus; Raynauds; Scleroderma Integumentary (Skin) Medical History: Negative for: History of Burn Musculoskeletal Medical History: Negative for: Gout; Rheumatoid Arthritis;  Osteoarthritis; Osteomyelitis Neurologic Medical History: Negative for: Dementia; Neuropathy; Quadriplegia; Paraplegia; Seizure Disorder Oncologic Medical History: SHARNITA, HENDRICH ([name]) [id number].pdf Page 10 of 11 Negative for: Received Chemotherapy; Received Radiation Past Medical History  Notes: skin Ca removed from back years ago Psychiatric Medical History: Negative for: Anorexia/bulimia; Confinement Anxiety Immunizations Pneumococcal Vaccine: Received Pneumococcal Vaccination: No Implantable Devices None Hospitalization / Surgery History Type of Hospitalization/Surgery cholecystectomy 1980s nephrolithasis 1980s plate and rod in right elbow surgery 2010 Family and Social History Cancer: Yes - Mother; Diabetes: Yes - Father; Heart Disease: No; Hypertension: Yes - Mother; Kidney Disease: Yes - Mother; Lung Disease: Yes - Father; Seizures: No; Stroke: No; Thyroid Problems: Yes - Paternal Grandparents; Tuberculosis: No; Never smoker; Marital Status - Married; Alcohol Use: Never; Drug Use: No History; Caffeine Use: Daily - coffee; Financial Concerns: No; Food, Clothing or Shelter Needs: No; Support System Lacking: No; Transportation Concerns: No Electronic Signature(s) Signed: 07/05/2022 8:14:24 AM By: [name] Maudlin MD FACS Entered By: [name] on 07/05/2022 08:10:25 -------------------------------------------------------------------------------- SuperBill Details Patient Name: Date of Service: [name], [name] [name] H. 07/05/2022 Medical Record Number: [name] Patient Account Number: [id number] Date of Birth/Sex: Treating RN: 06-18-1960 (62 y.o. F) Primary Care Provider: Loralyn [name] Other Clinician: Referring Provider: Treating Provider/Extender: [name] in Treatment: 23 Diagnosis Coding ICD-10 Codes Code Description [phone] Non-pressure chronic ulcer of right calf with fat layer exposed E66.01  Morbid (severe) obesity due to excess calories I10 Essential (primary) hypertension R60.0 Localized edema Facility Procedures : CPT4 Code: [id number] Description: [id number] - DEBRIDE WOUND 1ST 20 SQ CM OR < ICD-10 Diagnosis Description L97.212 Non-pressure chronic ulcer of right calf with fat layer exposed Modifier: Quantity: 1 : CPT4 Code: [id number] Description: [id number] - WOUND VAC-50 SQ CM OR LESS Modifier: Quantity: 1 Physician Procedures : CPT4 Code Description Modifier [id number] - WC PHYS LEVEL 4 - EST PT 25 ICD-10 Diagnosis Description L97.212 Non-pressure chronic ulcer of right calf with fat layer exposed I10 Essential (primary) hypertension R60.0 Localized edema E66.01 Morbid  (severe) obesity due to excess calories AYELIN, THORNBURY ([name]) [phone] Quantity: 1 .pdf Page 11 of 11 : [id number] - WC PHYS DEBR WO ANESTH 20 SQ CM 1 ICD-10 Diagnosis Description L97.212 Non-pressure chronic ulcer of right calf with fat layer exposed Quantity: Electronic Signature(s) Signed: 07/05/2022 8:12:10 AM By: [name] Maudlin MD FACS Entered By: [name] on 07/05/2022 08:12:10

## 2022-07-12 ENCOUNTER — Encounter (HOSPITAL_BASED_OUTPATIENT_CLINIC_OR_DEPARTMENT_OTHER): Payer: BC Managed Care – PPO | Attending: General Surgery | Admitting: General Surgery

## 2022-07-12 DIAGNOSIS — L97222 Non-pressure chronic ulcer of left calf with fat layer exposed: Secondary | ICD-10-CM | POA: Insufficient documentation

## 2022-07-12 DIAGNOSIS — L97212 Non-pressure chronic ulcer of right calf with fat layer exposed: Secondary | ICD-10-CM | POA: Insufficient documentation

## 2022-07-12 DIAGNOSIS — I10 Essential (primary) hypertension: Secondary | ICD-10-CM | POA: Diagnosis not present

## 2022-07-12 DIAGNOSIS — I872 Venous insufficiency (chronic) (peripheral): Secondary | ICD-10-CM | POA: Insufficient documentation

## 2022-07-12 DIAGNOSIS — G4730 Sleep apnea, unspecified: Secondary | ICD-10-CM | POA: Insufficient documentation

## 2022-07-12 NOTE — Progress Notes (Addendum)
[name], [name] ([id number]) [id number] Page 1 of 8 Visit Report for 07/12/2022 Arrival Information Details Patient Name: Date of Service: [name], [name] 07/12/2022 2:45 PM Medical Record Number: [id number] Patient Account Number: [id number] Date of Birth/Sex: Treating RN: 06/08/1960 (62 y.o. [name]) Herrington, Taylor Primary Care Sruthi Maurer: Nira ConnMichael, Barbara Other Clinician: Referring [name]: Treating Malkie [name]/Extender: Andrey Campanileannon, Jennifer Michael, Barbara Weeks in Treatment: 24 Visit Information History Since Last Visit Added or deleted any medications: No Patient Arrived: Ambulatory Any new allergies or adverse reactions: No Arrival Time: 14:47 Had a fall or experienced change in No Accompanied By: self activities of daily living that may affect Transfer Assistance: None risk of falls: Patient Identification Verified: Yes Signs or symptoms of abuse/neglect since last visito No Secondary Verification Process Completed: Yes Hospitalized since last visit: No Patient Requires Transmission-Based Precautions: No Implantable device outside of the clinic excluding No Patient Has Alerts: No cellular tissue based products placed in the center since last visit: Has Dressing in Place as Prescribed: Yes Has Compression in Place as Prescribed: Yes Pain Present Now: No Electronic Signature(s) Signed: 07/12/2022 3:21:47 PM By: Samuella BruinHerrington, Taylor Entered By: Samuella BruinHerrington, Taylor on 07/12/2022 14:48:09 -------------------------------------------------------------------------------- Compression Therapy Details Patient Name: Date of Service: [name], [name] 07/12/2022 2:45 PM Medical Record Number: [id number] Patient Account Number: [id number] Date of Birth/Sex: Treating RN: 04/30/1960 (62 y.o. [name]) Herrington, Taylor Primary Care Zaara Sprowl: Nira ConnMichael, Barbara Other Clinician: Referring Azrielle Springsteen: Treating Marija Calamari/Extender: Andrey Campanileannon, Jennifer Michael, Barbara Weeks in  Treatment: 24 Compression Therapy Performed for Wound Assessment: Wound #2 Right,Posterior Lower Leg Performed By: Clinician Samuella BruinHerrington, Taylor, RN Compression Type: Double Layer Post Procedure Diagnosis Same as Pre-procedure Electronic Signature(s) Signed: 07/12/2022 3:21:47 PM By: Gelene MinkHerrington, Taylor Entered By: Samuella BruinHerrington, Taylor on 07/12/2022 15:19:38 -------------------------------------------------------------------------------- Encounter Discharge Information Details Patient Name: Date of Service: [name] [name], [place] [name] 07/12/2022 2:45 PM Medical Record Number: [id number] Patient Account Number: [id number] Date of Birth/Sex: Treating RN: 06/22/1960 (62 y.o. [name]) Herrington, Taylor Primary Care Kalief Kattner: Nira ConnMichael, Barbara Other Clinician: Referring Jamala Kohen: Treating Edwyna Dangerfield/Extender: Andrey Campanileannon, Jennifer Michael, Barbara Weeks in Treatment: 24 Encounter Discharge Information Items Post Procedure Vitals Discharge Condition: Stable Temperature (F): 97.8 Ambulatory Status: Ambulatory Pulse (bpm): 112 Discharge Destination: Home Respiratory Rate (breaths/min): 20 Transportation: Private Auto Blood Pressure (mmHg): 135/84 [name], [name] ([id number]) [id number] Page 2 of 8 Accompanied By: self Schedule Follow-up Appointment: Yes Clinical Summary of Care: Patient Declined Electronic Signature(s) Signed: 07/12/2022 3:21:47 PM By: Gelene MinkHerrington, Taylor Entered By: Samuella BruinHerrington, Taylor on 07/12/2022 15:20:08 -------------------------------------------------------------------------------- Lower Extremity Assessment Details Patient Name: Date of Service: [name], [place] [name] 07/12/2022 2:45 PM Medical Record Number: [id number] Patient Account Number: [id number] Date of Birth/Sex: Treating RN: 10/24/1960 (62 y.o. [name]) Herrington, Taylor Primary Care [name] [name]: Nira ConnMichael, Barbara Other Clinician: Referring Shamir Sedlar: Treating Zae Kirtz/Extender: Andrey Campanileannon, Jennifer Michael,  Barbara Weeks in Treatment: 24 Edema Assessment Assessed: [name][Left: No] [name][Right: No] Edema: [Left: Ye] [Right: s] Calf Left: Right: Point of Measurement: From Medial Instep 42.5 cm Ankle Left: Right: Point of Measurement: From Medial Instep 23.8 cm Vascular Assessment Pulses: Dorsalis Pedis Palpable: [Right:Yes] Electronic Signature(s) Signed: 07/12/2022 3:21:47 PM By: Samuella BruinHerrington, Taylor Entered By: Samuella BruinHerrington, Taylor on 07/12/2022 14:54:46 -------------------------------------------------------------------------------- Multi Wound Chart Details Patient Name: Date of Service: [name], [name] 07/12/2022 2:45 PM Medical Record Number: [id number] Patient Account Number: [id number] Date of Birth/Sex: Treating RN: 11/29/1960 (62 y.o. F) Primary Care Quention Mcneill: Nira ConnMichael, Barbara Other Clinician: Referring Elesia Pemberton: Treating Chou Busler/Extender: Andrey Campanileannon, Jennifer Michael, Barbara Weeks in Treatment: 24 Vital Signs Height(in): 61 Pulse(bpm):  112 Weight(lbs): 277 Blood Pressure(mmHg): 135/84 Body Mass Index(BMI): 52.3 Temperature(F): 97.8 Respiratory Rate(breaths/min): 20 [2:Photos:] [N/A:N/A] Right, Posterior Lower Leg N/A N/A Wound Location: Insect Bite N/A N/A Wounding Event: Venous Leg Ulcer N/A N/A Primary Etiology: Anemia, Sleep Apnea, Hypertension, N/A N/A Comorbid History: Peripheral Venous Disease 11/21/2021 N/A N/A Date Acquired: 24 N/A N/A Weeks of Treatment: Open N/A N/A Wound Status: No N/A N/A Wound Recurrence: 2.6x2.4x0.1 N/A N/A Measurements L x W x D (cm) 4.901 N/A N/A A (cm) : rea 0.49 N/A N/A Volume (cm) : -1385.20% N/A N/A % Reduction in A rea: -394.90% N/A N/A % Reduction in Volume: Full Thickness Without Exposed N/A N/A Classification: Support Structures Medium N/A N/A Exudate A mount: Serous N/A N/A Exudate Type: amber N/A N/A Exudate Color: Distinct, outline attached N/A N/A Wound Margin: Medium (34-66%) N/A N/A Granulation A  mount: Red, Pink N/A N/A Granulation Quality: Medium (34-66%) N/A N/A Necrotic A mount: Fat Layer (Subcutaneous Tissue): Yes N/A N/A Exposed Structures: Fascia: No Tendon: No Muscle: No Joint: No Bone: No Small (1-33%) N/A N/A Epithelialization: Debridement - Selective/Open Wound N/A N/A Debridement: Pre-procedure Verification/Time Out 14:59 N/A N/A Taken: Lidocaine 4% Topical Solution N/A N/A Pain Control: Slough N/A N/A Tissue Debrided: Non-Viable Tissue N/A N/A Level: 6.24 N/A N/A Debridement A (sq cm): rea Curette N/A N/A Instrument: Minimum N/A N/A Bleeding: Pressure N/A N/A Hemostasis A chieved: Procedure was tolerated well N/A N/A Debridement Treatment Response: 2.6x2.4x0.1 N/A N/A Post Debridement Measurements L x W x D (cm) 0.49 N/A N/A Post Debridement Volume: (cm) Excoriation: No N/A N/A Periwound Skin Texture: Scarring: No No Abnormalities Noted N/A N/A Periwound Skin Moisture: Hemosiderin Staining: Yes N/A N/A Periwound Skin Color: Atrophie Blanche: No Ecchymosis: No Erythema: No Rubor: No No Abnormality N/A N/A Temperature: Compression Therapy N/A N/A Procedures Performed: Debridement Negative Pressure Wound Therapy Maintenance (NPWT) Treatment Notes Wound #2 (Lower Leg) Wound Laterality: Right, Posterior Cleanser Soap and Water Discharge Instruction: May shower and wash wound with dial antibacterial soap and water prior to dressing change. Wound Cleanser Discharge Instruction: Cleanse the wound with wound cleanser prior to applying a clean dressing using gauze sponges, not tissue or cotton balls. Peri-Wound Care Sween Lotion (Moisturizing lotion) Discharge Instruction: Apply moisturizing lotion as directed Topical Primary Dressing Promogran Prisma Matrix, 4.34 (sq in) (silver collagen) Discharge Instruction: Moisten collagen with saline or hydrogel SNAP VAC Secondary Dressing MANASVI, BERNDT ([id number])  [id number] Page 4 of 8 Secured With Compression Wrap Urgo K2 Lite, two layer compression system, regular Discharge Instruction: Apply Urgo K2 Lite as directed (alternative to 3 layer compression). Compression Stockings Add-Ons Electronic Signature(s) Signed: 07/12/2022 3:25:43 PM By: [name] MD FACS Entered By: [name] on 07/12/2022 15:25:43 -------------------------------------------------------------------------------- Multi-Disciplinary Care Plan Details Patient Name: Date of Service: [place], [name] [name] 07/12/2022 2:45 PM Medical Record Number: [id number] Patient Account Number: [id number] Date of Birth/Sex: Treating RN: 25-Jul-1960 (62 y.o. [name] Phenix Primary Care Anisa Leanos: Nira Conn Other Clinician: Referring Grisell Bissette: Treating Jowan Skillin/Extender: Andrey Campanile in Treatment: 24 Multidisciplinary Care Plan reviewed with physician Active Inactive Necrotic Tissue Nursing Diagnoses: Impaired tissue integrity related to necrotic/devitalized tissue Knowledge deficit related to management of necrotic/devitalized tissue Goals: Necrotic/devitalized tissue will be minimized in the wound bed Date Initiated: 01/23/2022 Target Resolution Date: 08/02/2022 Goal Status: Active Patient/caregiver will verbalize understanding of reason and process for debridement of necrotic tissue Date Initiated: 01/23/2022 Target Resolution Date: 08/02/2022 Goal Status: Active Interventions: Assess patient pain level pre-, during and  post procedure and prior to discharge Provide education on necrotic tissue and debridement process Treatment Activities: Apply topical anesthetic as ordered : 01/23/2022 Notes: Venous Leg Ulcer Nursing Diagnoses: Knowledge deficit related to disease process and management Potential for venous Insuffiency (use before diagnosis confirmed) Goals: Patient will maintain optimal edema  control Date Initiated: 07/05/2022 Target Resolution Date: 08/02/2022 Goal Status: Active Interventions: Assess peripheral edema status every visit. Compression as ordered Provide education on venous insufficiency Treatment Activities: Therapeutic compression applied : 07/05/2022 Notes: [name], [name] ([id number]) [id number] Page 5 of 8 Wound/Skin Impairment Nursing Diagnoses: Impaired tissue integrity Knowledge deficit related to ulceration/compromised skin integrity Goals: Patient/caregiver will verbalize understanding of skin care regimen Date Initiated: 01/23/2022 Target Resolution Date: 08/02/2022 Goal Status: Active Interventions: Assess patient/caregiver ability to obtain necessary supplies Assess ulceration(s) every visit Treatment Activities: Skin care regimen initiated : 01/23/2022 Topical wound management initiated : 01/23/2022 Notes: Electronic Signature(s) Signed: 07/12/2022 3:21:47 PM By: Samuella BruinHerrington, Taylor Entered By: Samuella BruinHerrington, Taylor on 07/12/2022 14:58:16 -------------------------------------------------------------------------------- Negative Pressure Wound Therapy Maintenance (NPWT) Details Patient Name: Date of Service: [place] [name], [name] 07/12/2022 2:45 PM Medical Record Number: [id number] Patient Account Number: [id number] Date of Birth/Sex: Treating RN: 10/26/1960 (62 y.o. [name]) Herrington, Taylor Primary Care Temekia Caskey: Nira ConnMichael, Barbara Other Clinician: Referring Taraoluwa Thakur: Treating Ily Denno/Extender: Andrey Campanileannon, Jennifer Michael, Barbara Weeks in Treatment: 24 NPWT Maintenance Performed for: Wound #2 Right, Posterior Lower Leg Performed By: Samuella BruinHerrington, Taylor, RN Type: VAC System Coverage Size (sq cm): 6.24 Pressure Type: Constant Pressure Setting: 125 mmHG Drain Type: None Primary Contact: Other : Sponge/Dressing Type: Foam- Blue Date Initiated: 05/31/2022 Dressing Removed: Yes Quantity of Sponges/Gauze Removed: 1 Canister  Changed: Yes Dressing Reapplied: Yes Quantity of Sponges/Gauze Inserted: 1 Days On NPWT : 43 Post Procedure Diagnosis Same as Pre-procedure Electronic Signature(s) Signed: 07/12/2022 3:21:47 PM By: Samuella BruinHerrington, Taylor Entered By: Samuella BruinHerrington, Taylor on 07/12/2022 15:01:46 -------------------------------------------------------------------------------- Pain Assessment Details Patient Name: Date of Service: [name], [name] 07/12/2022 2:45 PM Medical Record Number: [id number] Patient Account Number: [id number] Date of Birth/Sex: Treating RN: 03/11/1961 (62 y.o. [name]) Herrington, Taylor Primary Care Jamonte Curfman: Nira ConnMichael, Barbara Other Clinician: Referring Billee Balcerzak: Treating Marie Chow/Extender: Andrey Campanileannon, Jennifer Michael, Barbara Weeks in Treatment: [address] Active Problems [name], [name] ([id number]) [id number] Page 6 of 8 Location of Pain Severity and Description of Pain Patient Has Paino No Site Locations Rate the pain. Current Pain Level: 0 Pain Management and Medication Current Pain Management: Electronic Signature(s) Signed: 07/12/2022 3:21:47 PM By: Samuella BruinHerrington, Taylor Entered By: Samuella BruinHerrington, Taylor on 07/12/2022 14:49:49 -------------------------------------------------------------------------------- Patient/Caregiver Education Details Patient Name: Date of Service: [name], [name] [place]um[place]URA [name] Rexene EdisonH. 4/5/2024andnbsp2:45 PM Medical Record Number: [id number] Patient Account Number: [id number] Date of Birth/Gender: Treating RN: 03/07/1961 (62 y.o. [name]) Herrington, Taylor Primary Care Physician: Nira ConnMichael, Barbara Other Clinician: Referring Physician: Treating Physician/Extender: Andrey Campanileannon, Jennifer Michael, Barbara Weeks in Treatment: 24 Education Assessment Education Provided To: Patient Education Topics Provided Wound/Skin Impairment: Methods: Explain/Verbal Responses: Reinforcements needed, State content correctly Electronic Signature(s) Signed: 07/12/2022 3:21:47 PM By:  Samuella BruinHerrington, Taylor Entered By: Samuella BruinHerrington, Taylor on 07/12/2022 14:58:31 -------------------------------------------------------------------------------- Wound Assessment Details Patient Name: Date of Service: [name], [name] 07/12/2022 2:45 PM Medical Record Number: [id number] Patient Account Number: [id number] Date of Birth/Sex: Treating RN: 02/23/1961 (62 y.o. [name]) Herrington, Taylor Primary Care Malachi Kinzler: Nira ConnMichael, Barbara Other Clinician: Referring Mahrukh Seguin: Treating Arnaldo Heffron/Extender: Andrey Campanileannon, Jennifer Michael, Barbara Weeks in Treatment: 24 Wound Status Wound Number: 2 Primary Venous Leg Ulcer Etiology: Wound Location: Right, Posterior Lower Leg Wound Status: Open [name], [name] ([id number]) [id number]  Page 7 of 8 Wound Status: Open Wounding Event: Insect Bite Comorbid Anemia, Sleep Apnea, Hypertension, Peripheral Venous Date Acquired: 11/21/2021 History: Disease Weeks Of Treatment: 24 Clustered Wound: No Photos Wound Measurements Length: (cm) 2.6 Width: (cm) 2.4 Depth: (cm) 0.1 Area: (cm) 4.901 Volume: (cm) 0.49 % Reduction in Area: -1385.2% % Reduction in Volume: -394.9% Epithelialization: Small (1-33%) Tunneling: No Undermining: No Wound Description Classification: Full Thickness Without Exposed Support Structures Wound Margin: Distinct, outline attached Exudate Amount: Medium Exudate Type: Serous Exudate Color: amber Foul Odor After Cleansing: No Slough/Fibrino Yes Wound Bed Granulation Amount: Medium (34-66%) Exposed Structure Granulation Quality: Red, Pink Fascia Exposed: No Necrotic Amount: Medium (34-66%) Fat Layer (Subcutaneous Tissue) Exposed: Yes Necrotic Quality: Adherent Slough Tendon Exposed: No Muscle Exposed: No Joint Exposed: No Bone Exposed: No Periwound Skin Texture Texture Color No Abnormalities Noted: Yes No Abnormalities Noted: No Atrophie Blanche: No Moisture Ecchymosis: No No Abnormalities  Noted: Yes Erythema: No Hemosiderin Staining: Yes Rubor: No Temperature / Pain Temperature: No Abnormality Treatment Notes Wound #2 (Lower Leg) Wound Laterality: Right, Posterior Cleanser Soap and Water Discharge Instruction: May shower and wash wound with dial antibacterial soap and water prior to dressing change. Wound Cleanser Discharge Instruction: Cleanse the wound with wound cleanser prior to applying a clean dressing using gauze sponges, not tissue or cotton balls. Peri-Wound Care Sween Lotion (Moisturizing lotion) Discharge Instruction: Apply moisturizing lotion as directed Topical Primary Dressing Promogran Prisma Matrix, 4.34 (sq in) (silver collagen) Discharge Instruction: Moisten collagen with saline or hydrogel SNAP VAC [name], [name] ([id number]) [id number].pdf Page 8 of 8 Secondary Dressing Secured With Compression Wrap Urgo K2 Lite, two layer compression system, regular Discharge Instruction: Apply Urgo K2 Lite as directed (alternative to 3 layer compression). Compression Stockings Add-Ons Electronic Signature(s) Signed: 07/12/2022 3:21:47 PM By: Samuella Bruin Entered By: Samuella Bruin on 07/12/2022 14:57:11 -------------------------------------------------------------------------------- Vitals Details Patient Name: Date of Service: [name] [place], [name] [name] 07/12/2022 2:45 PM Medical Record Number: [id number] Patient Account Number: [id number] Date of Birth/Sex: Treating RN: 12/02/1960 (62 y.o. [name] Phenix Primary Care Taevin Mcferran: Nira Conn Other Clinician: Referring Tkeya Stencil: Treating Teague Goynes/Extender: Andrey Campanile in Treatment: 24 Vital Signs Time Taken: 14:49 Temperature (F): 97.8 Height (in): 61 Pulse (bpm): 112 Weight (lbs): 277 Respiratory Rate (breaths/min): 20 Body Mass Index (BMI): 52.3 Blood Pressure (mmHg): 135/84 Reference Range: 80 - 120 mg / dl Electronic  Signature(s) Signed: 07/12/2022 3:21:47 PM By: Samuella Bruin Entered By: Samuella Bruin on 07/12/2022 14:49:43

## 2022-07-12 NOTE — Progress Notes (Signed)
[name], [name] ([id number]) [id number] Page 1 of 11 Visit Report for 07/12/2022 Chief Complaint Document Details Patient Name: Date of Service: [name] 07/12/2022 2:45 PM Medical Record Number: [id number] Patient Account Number: [id number] Date of Birth/Sex: Treating RN: April 27, 1960 (62 y.o. F) Primary Care Provider: Nira Conn Other Clinician: Referring Provider: Treating Provider/Extender: Andrey Campanile in Treatment: 24 Information Obtained from: Patient Chief Complaint RLE ulcer Electronic Signature(s) Signed: 07/12/2022 3:29:00 PM By: [name] MD FACS Entered By: [name] on 07/12/2022 15:28:59 -------------------------------------------------------------------------------- Debridement Details Patient Name: Date of Service: [name], [name] 07/12/2022 2:45 PM Medical Record Number: [id number] Patient Account Number: [id number] Date of Birth/Sex: Treating RN: 09-May-1960 (62 y.o. [name] Primary Care Provider: Nira Conn Other Clinician: Referring Provider: Treating Provider/Extender: Andrey Campanile in Treatment: 24 Debridement Performed for Assessment: Wound #2 Right,Posterior Lower Leg Performed By: Physician [name], MD Debridement Type: Debridement Severity of Tissue Pre Debridement: Fat layer exposed Level of Consciousness (Pre-procedure): Awake and Alert Pre-procedure Verification/Time Out Yes - 14:59 Taken: Start Time: 14:59 Pain Control: Lidocaine 4% T opical Solution T Area Debrided (L x W): otal 2.6 (cm) x 2.4 (cm) = 6.24 (cm) Tissue and other material debrided: Non-Viable, Slough, Slough Level: Non-Viable Tissue Debridement Description: Selective/Open Wound Instrument: Curette Bleeding: Minimum Hemostasis Achieved: Pressure Response to Treatment: Procedure was tolerated well Level of Consciousness (Post- Awake and  Alert procedure): Post Debridement Measurements of Total Wound Length: (cm) 2.6 Width: (cm) 2.4 Depth: (cm) 0.1 Volume: (cm) 0.49 Character of Wound/Ulcer Post Debridement: Improved Severity of Tissue Post Debridement: Fat layer exposed Post Procedure Diagnosis Same as Pre-procedure Notes scribed for Dr. Lady Gary by Samuella Bruin, RN Electronic Signature(s) Signed: 07/12/2022 3:21:47 PM By: Samuella Bruin Signed: 07/12/2022 3:34:51 PM By: [name] MD FACS Entered By: Samuella Bruin on 07/12/2022 15:01:18 [name] ([id number]) [id number] Page 2 of 11 -------------------------------------------------------------------------------- HPI Details Patient Name: Date of Service: [name] 07/12/2022 2:45 PM Medical Record Number: [id number] Patient Account Number: [id number] Date of Birth/Sex: Treating RN: [birth year] (62 y.o. F) Primary Care Provider: Nira Conn Other Clinician: Referring Provider: Treating Provider/Extender: Andrey Campanile in Treatment: 24 History of Present Illness HPI Description: 02/16/2020 upon evaluation today patient actually appears to be doing poorly in regard to her left medial lower extremity ulcer. This is actually an area that she tells me she has had intermittent issues with over the years although has been closed for some time she typically uses compression right now she has juxta lite compression wraps. With that being said she tells me that this nonetheless open several weeks/months ago and has been given her trouble since. She does have a history of chronic venous insufficiency she is seeing specialist for this in the past she has had an ablation as well as sclerotherapy. With that being said she also has hypertension chronically which is managed by her primary care provider. In general she seems to be worsening overall with regard to the wound and states that she  finally realized that she needed to come in and have somebody look at this and not continue to try to manage this on her own. No fevers, chills, nausea, vomiting, or diarrhea. 02/23/2020 on evaluation today patient appears to be doing well with regard to her wound. This is showing some signs of improvement which is great news still were not quite at the point where I would like to be as  far as the overall appearance of the wound is concerned but I do believe this is better than last week. I do believe the Iodoflex is helping as well. 03/08/2020 upon evaluation today patient appears to be doing well with regard to her wound. She has been tolerating the dressing changes without complication. Fortunately I feel like she has made great progress with the Iodoflex but I feel like it may be the point rest to switch to something else possibly a collagen- based dressing at this time. 03/15/2020 upon evaluation today patient appears to be doing excellent in regard to her leg ulcer. She has been tolerating the dressing changes without complication. Fortunately there is no signs of active infection. Overall she is measuring a little bit smaller today which is great news. 03/22/2020 upon evaluation today patient appears to be doing well with regard to her wound. She has been tolerating the dressing changes without complication. Fortunately there is no signs of active infection at this time. 03/28/2020; patient I do not usually see however she has a wound on the left anterior lower leg secondary to chronic venous insufficiency we have been using silver collagen under compression. She arrives in clinic with a nonviable surface requiring debridement 04/12/2020 upon evaluation today patient appears to be doing well all things considered with regard to her leg ulcer. She is tolerating the dressing changes without complication there is minimal dry skin around the edges of the wound that may be trapping and stopping some of  the events of the new skin I am can work on that today. Otherwise the surface of the wound appears to be doing excellent. 04/19/2020 upon evaluation today patient appears to be doing well with regard to her leg ulcer. She has been tolerating dressing changes without complication. Fortunately there is no signs of active infection at this time. No fever chills noted overall very pleased with how things seem to be progressing. 04/26/2020 on evaluation today patient appears to be doing well with regard to her wound currently. Is showing signs of excellent improvement overall is filling in nicely and there does not appear to be any signs of infection. No fevers, chills, nausea, vomiting, or diarrhea. 05/03/2020 upon evaluation today patient appears to be doing well with regard to her wound on the leg. This overall showing signs of good improvement which is great she has some good epithelial growth and overall I think that things are moving in the correct direction. We likewise going to continue with the wound care measures as before since she seems to making such good improvement. 2/3; venous wound on the left medial leg. This is contracting. We are using Prisma and 3 layer compression. She has a stocking and waiting in the eventuality this heals. She is already using it on the right 05/17/2020 upon evaluation today patient appears to be doing well with regard to her leg ulcer. She has been tolerating the dressing changes without complication. Fortunately there is no signs of active infection which is great news and overall very pleased with where things stand today. No fevers, chills, nausea, vomiting, or diarrhea. 05/24/2020 upon evaluation today patient appears to be doing well with regard to her wound. Overall I feel like she is making excellent progress. There does not appear to be any signs of active infection which is great news. 05/31/2020 upon evaluation today patient appears to be doing well with  regard to her wound. There does not appear to be any signs of active infection which is  great news overall I am extremely pleased with where things stand today. READMISSION 01/23/2022 She returns to clinic today with a new wound on her right posterior calf. She says that she was cleaning out an old shed near the middle of August this year and then noticed what seemed to be a bug bite on her right posterior calf. It was itchy, red, and raised. By the end of September, an ulcer had developed. She has been applying various topical creams such as hydrocortisone and others to the site. When it was not improving, she made an appointment in the wound care center. ABI in clinic today was 0.97. On her right posterior calf, there is a circular wound with necrotic fat and black eschar present. There is no purulent drainage or malodor. The periwound skin is in good condition with just a little induration that appears to be secondary to inflammation. 01/31/2022: The wound measures a little bit larger today, but overall is quite a bit cleaner. There is some undermining from 9:00 to 3:00. She is having some periwound itching and says that her wrap slid. 02/08/2022: Despite using an Unna boot first layer at the top of her wrap, it slid again and it looks like there is been some bruising at the wound site. The wound is about the same size in terms of dimensions. There is a fair amount of slough and nonviable tissue still present. Edema control is better than last week. 02/15/2022: The wound dimensions are about the same. There is less nonviable tissue present. The periwound erythema has improved. 02/22/2022: The wound has deteriorated over the past week. It is larger and the periwound is more edematous, erythematous, and indurated. It looks as though there has been more tissue breakdown with undermining present. She is having more pain. There is a foul odor coming from the wound. 02/26/2022: The wound looks quite a  bit better today and the odor is gone. I still do not have her culture data back, but she seems to be responding well to the Augmentin. 03/06/2022: Her wound continues to improve. There is still some slough accumulation, but the periwound skin is less inflamed. Her culture returned with a RUHEE, ENCK ([id number]) [id number] Page 3 of 11 polymicrobial population including Pseudomonas and so levofloxacin was also prescribed. She did not understand why a second antibiotic was being added so she has not yet initiated this. 03/14/2022: The wound is about the same, to perhaps a little bit larger. There is a fair amount of slough accumulation on the surface, as well as some hypertrophic granulation tissue. She is still taking levofloxacin, but has completed taking Augmentin. She has her Jodie Echevaria compound with her today. 12/14; the patient has a significant circular wound on the posterior right calf. She is using Keystone and silver alginate under 3 layer compression. Her ABIs were within normal limits at 0.97. This may have been traumatic or an insect bite at the start I reviewed these records. 04/04/2022: Since I last saw the wound, it has contracted considerably. There is a fairly thick layer of slough on the surface, as it has not been debrided since the last time I did it. Edema control is excellent and the periwound skin is in much better condition. 04/12/2022: No significant change in the wound dimensions. It is filling with granulation tissue. Still with slough accumulation on the surface. 04/19/2022: The wound is smaller this week. There is some slough accumulation on the surface. 04/26/2022: The wound is smaller again this week  and significantly cleaner. The wound surface is a little bit drier than ideal. 05/03/2022: The wound measurements were about the same, but visually it appears smaller. There is still some undermining at the top of the wound. Moisture balance is  better this week. 05/10/2022: The wound measured smaller today. There is still a fair amount of undermining present. Slough has built up on the surface. We are still awaiting snap VAC approval. 05/17/2022: The wound is a little bit smaller today. There is less slough on the surface, but the granulation tissue still is not very robust. She has been approved for snap VAC but will have a 20% coinsurance and it is not clear what that amount would end up being for her. 05/27/2022: The surface of the wound has deteriorated and it is gray and fibrotic. No significant odor, but the drainage on her dressing was a little bit purulent. 05/31/2022: The wound looks quite a bit better today. It is still a little fibrotic but no longer has purulent-looking drainage. The color is better. She spoke with her insurance company and is interested in trying the snap VAC, now that she is aware of the cost to her. 06/07/2022: After 1 week in the snap VAC, there has been substantial improvement to her wound. The undermined portion is closing in. The surface has a healthier color and appearance. There is very minimal slough accumulation. 06/14/2022: The more shallow part of the undermined portion of her wound has closed. There is still some undermining from about 1-2 o'clock. The surface continues to improve. Minimal slough accumulation. 06/28/2022: The wound is shallower this week and the undermining has essentially closed and completely. The surface tissue is improving. 07/05/2022: The depth is almost immeasurable and the surface is nearly flush with the surrounding skin. The undermining has been eliminated. There is light slough on the wound surface. 07/12/2022: There is a little bit of slough on the wound surface. The depth is about the same as last week. Electronic Signature(s) Signed: 07/12/2022 3:29:28 PM By: [name] MD FACS Entered By: [name] on 07/12/2022  15:29:27 -------------------------------------------------------------------------------- Physical Exam Details Patient Name: Date of Service: [name], [name] 07/12/2022 2:45 PM Medical Record Number: [id number] Patient Account Number: [id number] Date of Birth/Sex: Treating RN: 12-16-1960 (62 y.o. F) Primary Care Provider: Nira Conn Other Clinician: Referring Provider: Treating Provider/Extender: Andrey Campanile in Treatment: 24 Constitutional .Tachycardic, asymptomatic. . . no acute distress. Respiratory Normal work of breathing on room air. Notes 07/12/2022: There is a little bit of slough on the wound surface. The depth is about the same as last week. Electronic Signature(s) Signed: 07/12/2022 3:30:16 PM By: [name] MD FACS Entered By: [name] on 07/12/2022 15:30:15 -------------------------------------------------------------------------------- Physician Orders Details Patient Name: Date of Service: [name], [name] 07/12/2022 2:45 PM Medical Record Number: [id number] Patient Account Number: [id number] BRISTAL, HOOTER ([id number]) [id number] Page 4 of 11 Date of Birth/Sex: Treating RN: 19-Nov-1960 (62 y.o. [name] Primary Care Provider: Other Clinician: Nira Conn Referring Provider: Treating Provider/Extender: Andrey Campanile in Treatment: 24 Verbal / Phone Orders: No Diagnosis Coding ICD-10 Coding Code Description [phone] Non-pressure chronic ulcer of right calf with fat layer exposed E66.01 Morbid (severe) obesity due to excess calories I10 Essential (primary) hypertension R60.0 Localized edema Follow-up Appointments ppointment in 1 week. - Dr. Lady Gary RM 1 Return A Anesthetic (In clinic) Topical Lidocaine 4% applied to wound bed Bathing/ Shower/ Hygiene May shower with protection but do  not get wound dressing(s) wet. Protect dressing(s) with  water repellant cover (for example, large plastic bag) or a cast cover and may then take shower. Negative Presssure Wound Therapy Wound #2 Right,Posterior Lower Leg SNAP Vac to wound continuously at 153mm/hg pressure Blue Foam Edema Control - Lymphedema / SCD / Other Bilateral Lower Extremities Elevate legs to the level of the heart or above for 30 minutes daily and/or when sitting for 3-4 times a day throughout the day. Avoid standing for long periods of time. Exercise regularly Moisturize legs daily. Compression stocking or Garment 20-30 mm/Hg pressure to: - left leg daily. Apply first thing in the morning, remove at night. Wound Treatment Wound #2 - Lower Leg Wound Laterality: Right, Posterior Cleanser: Soap and Water 1 x Per Week/30 Days Discharge Instructions: May shower and wash wound with dial antibacterial soap and water prior to dressing change. Cleanser: Wound Cleanser 1 x Per Week/30 Days Discharge Instructions: Cleanse the wound with wound cleanser prior to applying a clean dressing using gauze sponges, not tissue or cotton balls. Peri-Wound Care: Sween Lotion (Moisturizing lotion) 1 x Per Week/30 Days Discharge Instructions: Apply moisturizing lotion as directed Prim Dressing: Promogran Prisma Matrix, 4.34 (sq in) (silver collagen) 1 x Per Week/30 Days ary Discharge Instructions: Moisten collagen with saline or hydrogel Prim Dressing: SNAP VAC ary 1 x Per Week/30 Days Compression Wrap: Urgo K2 Lite, two layer compression system, regular 1 x Per Week/30 Days Discharge Instructions: Apply Urgo K2 Lite as directed (alternative to 3 layer compression). Patient Medications llergies: Iodinated Contrast Media, Sulfa (Sulfonamide Antibiotics) A Notifications Medication Indication Start End 07/12/2022 lidocaine DOSE 0 - topical 4 % cream - 0 cream topical Electronic Signature(s) Signed: 07/12/2022 3:34:51 PM By: [name] MD FACS Previous Signature: 07/12/2022 3:21:47 PM  Version By: Samuella Bruin Entered By: [name] on 07/12/2022 15:31:02 [name] ([id number]) [id number] Page 5 of 11 -------------------------------------------------------------------------------- Problem List Details Patient Name: Date of Service: [name] 07/12/2022 2:45 PM Medical Record Number: [id number] Patient Account Number: [id number] Date of Birth/Sex: Treating RN: 15-Apr-1960 (62 y.o. F) Primary Care Provider: Nira Conn Other Clinician: Referring Provider: Treating Provider/Extender: Andrey Campanile in Treatment: 24 Active Problems ICD-10 Encounter Code Description Active Date MDM Diagnosis L97.212 Non-pressure chronic ulcer of right calf with fat layer exposed 01/23/2022 No Yes E66.01 Morbid (severe) obesity due to excess calories 01/23/2022 No Yes I10 Essential (primary) hypertension 01/23/2022 No Yes R60.0 Localized edema 01/23/2022 No Yes Inactive Problems Resolved Problems Electronic Signature(s) Signed: 07/12/2022 3:25:37 PM By: [name] MD FACS Entered By: [name] on 07/12/2022 15:25:37 -------------------------------------------------------------------------------- Progress Note Details Patient Name: Date of Service: [name], [name] 07/12/2022 2:45 PM Medical Record Number: [id number] Patient Account Number: [id number] Date of Birth/Sex: Treating RN: 26-Mar-1961 (62 y.o. F) Primary Care Provider: Nira Conn Other Clinician: Referring Provider: Treating Provider/Extender: Andrey Campanile in Treatment: 24 Subjective Chief Complaint Information obtained from Patient RLE ulcer History of Present Illness (HPI) 02/16/2020 upon evaluation today patient actually appears to be doing poorly in regard to her left medial lower extremity ulcer. This is actually an area that she tells me she has had intermittent issues with over the  years although has been closed for some time she typically uses compression right now she has juxta lite compression wraps. With that being said she tells me that this nonetheless open several weeks/months ago and has been given her trouble since. She does have a history  of chronic venous insufficiency she is seeing specialist for this in the past she has had an ablation as well as sclerotherapy. With that being said she also has hypertension chronically which is managed by her primary care provider. In general she seems to be worsening overall with regard to the wound and states that she finally realized that she needed to come in and have somebody look at this and not continue to try to manage this on her own. No fevers, chills, nausea, vomiting, or diarrhea. 02/23/2020 on evaluation today patient appears to be doing well with regard to her wound. This is showing some signs of improvement which is great news still were not quite at the point where I would like to be as far as the overall appearance of the wound is concerned but I do believe this is better than last week. I do believe the Iodoflex is helping as well. 03/08/2020 upon evaluation today patient appears to be doing well with regard to her wound. She has been tolerating the dressing changes without complication. Fortunately I feel like she has made great progress with the Iodoflex but I feel like it may be the point rest to switch to something else possibly a collagen- based dressing at this time. 03/15/2020 upon evaluation today patient appears to be doing excellent in regard to her leg ulcer. She has been tolerating the dressing changes without Hilton CorkWATKINS, Halo H ([id number]) [id number] Page 6 of 11 complication. Fortunately there is no signs of active infection. Overall she is measuring a little bit smaller today which is great news. 03/22/2020 upon evaluation today patient appears to be doing well with regard  to her wound. She has been tolerating the dressing changes without complication. Fortunately there is no signs of active infection at this time. 03/28/2020; patient I do not usually see however she has a wound on the left anterior lower leg secondary to chronic venous insufficiency we have been using silver collagen under compression. She arrives in clinic with a nonviable surface requiring debridement 04/12/2020 upon evaluation today patient appears to be doing well all things considered with regard to her leg ulcer. She is tolerating the dressing changes without complication there is minimal dry skin around the edges of the wound that may be trapping and stopping some of the events of the new skin I am can work on that today. Otherwise the surface of the wound appears to be doing excellent. 04/19/2020 upon evaluation today patient appears to be doing well with regard to her leg ulcer. She has been tolerating dressing changes without complication. Fortunately there is no signs of active infection at this time. No fever chills noted overall very pleased with how things seem to be progressing. 04/26/2020 on evaluation today patient appears to be doing well with regard to her wound currently. Is showing signs of excellent improvement overall is filling in nicely and there does not appear to be any signs of infection. No fevers, chills, nausea, vomiting, or diarrhea. 05/03/2020 upon evaluation today patient appears to be doing well with regard to her wound on the leg. This overall showing signs of good improvement which is great she has some good epithelial growth and overall I think that things are moving in the correct direction. We likewise going to continue with the wound care measures as before since she seems to making such good improvement. 2/3; venous wound on the left medial leg. This is contracting. We are using Prisma and 3 layer compression. She has a  stocking and waiting in the eventuality this  heals. She is already using it on the right 05/17/2020 upon evaluation today patient appears to be doing well with regard to her leg ulcer. She has been tolerating the dressing changes without complication. Fortunately there is no signs of active infection which is great news and overall very pleased with where things stand today. No fevers, chills, nausea, vomiting, or diarrhea. 05/24/2020 upon evaluation today patient appears to be doing well with regard to her wound. Overall I feel like she is making excellent progress. There does not appear to be any signs of active infection which is great news. 05/31/2020 upon evaluation today patient appears to be doing well with regard to her wound. There does not appear to be any signs of active infection which is great news overall I am extremely pleased with where things stand today. READMISSION 01/23/2022 She returns to clinic today with a new wound on her right posterior calf. She says that she was cleaning out an old shed near the middle of August this year and then noticed what seemed to be a bug bite on her right posterior calf. It was itchy, red, and raised. By the end of September, an ulcer had developed. She has been applying various topical creams such as hydrocortisone and others to the site. When it was not improving, she made an appointment in the wound care center. ABI in clinic today was 0.97. On her right posterior calf, there is a circular wound with necrotic fat and black eschar present. There is no purulent drainage or malodor. The periwound skin is in good condition with just a little induration that appears to be secondary to inflammation. 01/31/2022: The wound measures a little bit larger today, but overall is quite a bit cleaner. There is some undermining from 9:00 to 3:00. She is having some periwound itching and says that her wrap slid. 02/08/2022: Despite using an Unna boot first layer at the top of her wrap, it slid again and it looks  like there is been some bruising at the wound site. The wound is about the same size in terms of dimensions. There is a fair amount of slough and nonviable tissue still present. Edema control is better than last week. 02/15/2022: The wound dimensions are about the same. There is less nonviable tissue present. The periwound erythema has improved. 02/22/2022: The wound has deteriorated over the past week. It is larger and the periwound is more edematous, erythematous, and indurated. It looks as though there has been more tissue breakdown with undermining present. She is having more pain. There is a foul odor coming from the wound. 02/26/2022: The wound looks quite a bit better today and the odor is gone. I still do not have her culture data back, but she seems to be responding well to the Augmentin. 03/06/2022: Her wound continues to improve. There is still some slough accumulation, but the periwound skin is less inflamed. Her culture returned with a polymicrobial population including Pseudomonas and so levofloxacin was also prescribed. She did not understand why a second antibiotic was being added so she has not yet initiated this. 03/14/2022: The wound is about the same, to perhaps a little bit larger. There is a fair amount of slough accumulation on the surface, as well as some hypertrophic granulation tissue. She is still taking levofloxacin, but has completed taking Augmentin. She has her Jodie Echevaria compound with her today. 12/14; the patient has a significant circular wound on the posterior right  calf. She is using Keystone and silver alginate under 3 layer compression. Her ABIs were within normal limits at 0.97. This may have been traumatic or an insect bite at the start I reviewed these records. 04/04/2022: Since I last saw the wound, it has contracted considerably. There is a fairly thick layer of slough on the surface, as it has not been debrided since the last time I did it. Edema control is  excellent and the periwound skin is in much better condition. 04/12/2022: No significant change in the wound dimensions. It is filling with granulation tissue. Still with slough accumulation on the surface. 04/19/2022: The wound is smaller this week. There is some slough accumulation on the surface. 04/26/2022: The wound is smaller again this week and significantly cleaner. The wound surface is a little bit drier than ideal. 05/03/2022: The wound measurements were about the same, but visually it appears smaller. There is still some undermining at the top of the wound. Moisture balance is better this week. 05/10/2022: The wound measured smaller today. There is still a fair amount of undermining present. Slough has built up on the surface. We are still awaiting snap VAC approval. 05/17/2022: The wound is a little bit smaller today. There is less slough on the surface, but the granulation tissue still is not very robust. She has been approved for snap VAC but will have a 20% coinsurance and it is not clear what that amount would end up being for her. 05/27/2022: The surface of the wound has deteriorated and it is gray and fibrotic. No significant odor, but the drainage on her dressing was a little bit purulent. 05/31/2022: The wound looks quite a bit better today. It is still a little fibrotic but no longer has purulent-looking drainage. The color is better. She spoke with her insurance company and is interested in trying the snap VAC, now that she is aware of the cost to her. 06/07/2022: After 1 week in the snap VAC, there has been substantial improvement to her wound. The undermined portion is closing in. The surface has a healthier color and appearance. There is very minimal slough accumulation. 06/14/2022: The more shallow part of the undermined portion of her wound has closed. There is still some undermining from about 1-2 o'clock. The surface ABEEHA, TWIST ([id number]) [phone].pdf  Page 7 of 11 continues to improve. Minimal slough accumulation. 06/28/2022: The wound is shallower this week and the undermining has essentially closed and completely. The surface tissue is improving. 07/05/2022: The depth is almost immeasurable and the surface is nearly flush with the surrounding skin. The undermining has been eliminated. There is light slough on the wound surface. 07/12/2022: There is a little bit of slough on the wound surface. The depth is about the same as last week. Patient History Information obtained from Patient. Family History Cancer - Mother, Diabetes - Father, Hypertension - Mother, Kidney Disease - Mother, Lung Disease - Father, Thyroid Problems - Paternal Grandparents, No family history of Heart Disease, Seizures, Stroke, Tuberculosis. Social History Never smoker, Marital Status - Married, Alcohol Use - Never, Drug Use - No History, Caffeine Use - Daily - coffee. Medical History Eyes Denies history of Cataracts, Glaucoma, Optic Neuritis Ear/Nose/Mouth/Throat Denies history of Chronic sinus problems/congestion, Middle ear problems Hematologic/Lymphatic Patient has history of Anemia - iron Denies history of Hemophilia, Human Immunodeficiency Virus, Lymphedema, Sickle Cell Disease Respiratory Patient has history of Sleep Apnea - CPAP Denies history of Aspiration, Asthma, Chronic Obstructive Pulmonary Disease (COPD), Pneumothorax, Tuberculosis Cardiovascular  Patient has history of Hypertension, Peripheral Venous Disease Denies history of Angina, Arrhythmia, Congestive Heart Failure, Coronary Artery Disease, Deep Vein Thrombosis, Hypotension, Myocardial Infarction, Peripheral Arterial Disease, Phlebitis, Vasculitis Gastrointestinal Denies history of Cirrhosis , Colitis, Crohnoos, Hepatitis A, Hepatitis B, Hepatitis C Endocrine Denies history of Type I Diabetes, Type II Diabetes Genitourinary Denies history of End Stage Renal Disease Immunological Denies  history of Lupus Erythematosus, Raynaudoos, Scleroderma Integumentary (Skin) Denies history of History of Burn Musculoskeletal Denies history of Gout, Rheumatoid Arthritis, Osteoarthritis, Osteomyelitis Neurologic Denies history of Dementia, Neuropathy, Quadriplegia, Paraplegia, Seizure Disorder Oncologic Denies history of Received Chemotherapy, Received Radiation Psychiatric Denies history of Anorexia/bulimia, Confinement Anxiety Hospitalization/Surgery History - cholecystectomy 1980s. - nephrolithasis 1980s. - plate and rod in right elbow surgery 2010. 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 no acute distress. Vitals Time Taken: 2:49 PM, Height: 61 in, Weight: 277 lbs, BMI: 52.3, Temperature: 97.8 F, Pulse: 112 bpm, Respiratory Rate: 20 breaths/min, Blood Pressure: 135/84 mmHg. Respiratory Normal work of breathing on room air. General Notes: 07/12/2022: There is a little bit of slough on the wound surface. The depth is about the same as last week. Integumentary (Hair, Skin) Wound #2 status is Open. Original cause of wound was Insect Bite. The date acquired was: 11/21/2021. The wound has been in treatment 24 weeks. The wound is located on the Right,Posterior Lower Leg. The wound measures 2.6cm length x 2.4cm width x 0.1cm depth; 4.901cm^2 area and 0.49cm^3 volume. There is Fat Layer (Subcutaneous Tissue) exposed. There is no tunneling or undermining noted. There is a medium amount of serous drainage noted. The wound margin KACEE, SUKHU ([id number]) [phone].pdf Page 8 of 11 is distinct with the outline attached to the wound base. There is medium (34-66%) red, pink granulation within the wound bed. There is a medium (34-66%) amount of necrotic tissue within the wound bed including Adherent Slough. The periwound skin  appearance had no abnormalities noted for texture. The periwound skin appearance had no abnormalities noted for moisture. The periwound skin appearance exhibited: Hemosiderin Staining. The periwound skin appearance did not exhibit: Atrophie Blanche, Ecchymosis, Rubor, Erythema. Periwound temperature was noted as No Abnormality. Assessment Active Problems ICD-10 Non-pressure chronic ulcer of right calf with fat layer exposed Morbid (severe) obesity due to excess calories Essential (primary) hypertension Localized edema Procedures Wound #2 Pre-procedure diagnosis of Wound #2 is a Venous Leg Ulcer located on the Right,Posterior Lower Leg .Severity of Tissue Pre Debridement is: Fat layer exposed. There was a Selective/Open Wound Non-Viable Tissue Debridement with a total area of 6.24 sq cm performed by [name], MD. With the following instrument(s): Curette to remove Non-Viable tissue/material. Material removed includes [facility] after achieving pain control using Lidocaine 4% Topical Solution. No specimens were taken. A time out was conducted at 14:59, prior to the start of the procedure. A Minimum amount of bleeding was controlled with Pressure. The procedure was tolerated well. Post Debridement Measurements: 2.6cm length x 2.4cm width x 0.1cm depth; 0.49cm^3 volume. Character of Wound/Ulcer Post Debridement is improved. Severity of Tissue Post Debridement is: Fat layer exposed. Post procedure Diagnosis Wound #2: Same as Pre-Procedure General Notes: scribed for Dr. Lady Gary by Samuella Bruin, RN. Pre-procedure diagnosis of Wound #2 is a Venous Leg Ulcer located on the Right,Posterior Lower Leg . There was a Double Layer Compression Therapy Procedure by Samuella Bruin, RN. Post procedure Diagnosis Wound #2: Same as Pre-Procedure Plan  Follow-up Appointments: Return Appointment in 1 week. - Dr. Lady Gary RM 1 Anesthetic: (In clinic) Topical Lidocaine 4% applied to wound bed Bathing/  Shower/ Hygiene: May shower with protection but do not get wound dressing(s) wet. Protect dressing(s) with water repellant cover (for example, large plastic bag) or a cast cover and may then take shower. Negative Presssure Wound Therapy: Wound #2 Right,Posterior Lower Leg: SNAP Vac to wound continuously at 18mm/hg pressure Blue Foam Edema Control - Lymphedema / SCD / Other: Elevate legs to the level of the heart or above for 30 minutes daily and/or when sitting for 3-4 times a day throughout the day. Avoid standing for long periods of time. Exercise regularly Moisturize legs daily. Compression stocking or Garment 20-30 mm/Hg pressure to: - left leg daily. Apply first thing in the morning, remove at night. The following medication(s) was prescribed: lidocaine topical 4 % cream 0 0 cream topical was prescribed at facility WOUND #2: - Lower Leg Wound Laterality: Right, Posterior Cleanser: Soap and Water 1 x Per Week/30 Days Discharge Instructions: May shower and wash wound with dial antibacterial soap and water prior to dressing change. Cleanser: Wound Cleanser 1 x Per Week/30 Days Discharge Instructions: Cleanse the wound with wound cleanser prior to applying a clean dressing using gauze sponges, not tissue or cotton balls. Peri-Wound Care: Sween Lotion (Moisturizing lotion) 1 x Per Week/30 Days Discharge Instructions: Apply moisturizing lotion as directed Prim Dressing: Promogran Prisma Matrix, 4.34 (sq in) (silver collagen) 1 x Per Week/30 Days ary Discharge Instructions: Moisten collagen with saline or hydrogel Prim Dressing: SNAP VAC 1 x Per Week/30 Days ary Com pression Wrap: Urgo K2 Lite, two layer compression system, regular 1 x Per Week/30 Days Discharge Instructions: Apply Urgo K2 Lite as directed (alternative to 3 layer compression). 07/12/2022: There is a little bit of slough on the wound surface. The depth is about the same as last week. I used a curette to debride the slough  from the wound surface. I think there is still enough depth to the site that we can use the snap VAC 1 more week. We will continue to apply Prisma silver collagen to the wound bed and negative pressure wound therapy (snap VAC) over this. Continue 3 layer compression wrapping. Follow-up in 1 week. YIZEL, CANBY ([id number]) [id number] Page 9 of 11 Electronic Signature(s) Signed: 07/12/2022 3:32:50 PM By: [name] MD FACS Entered By: [name] on 07/12/2022 15:32:50 -------------------------------------------------------------------------------- HxROS Details Patient Name: Date of Service: [name], [name] 07/12/2022 2:45 PM Medical Record Number: [id number] Patient Account Number: [id number] Date of Birth/Sex: Treating RN: 1960-06-07 (62 y.o. F) Primary Care Provider: Nira Conn Other Clinician: Referring Provider: Treating Provider/Extender: Andrey Campanile in Treatment: 24 Information Obtained From Patient Eyes Medical History: Negative for: Cataracts; Glaucoma; Optic Neuritis Ear/Nose/Mouth/Throat Medical History: Negative for: Chronic sinus problems/congestion; Middle ear problems Hematologic/Lymphatic Medical History: Positive for: Anemia - iron Negative for: Hemophilia; Human Immunodeficiency Virus; Lymphedema; Sickle Cell Disease Respiratory Medical History: Positive for: Sleep Apnea - CPAP Negative for: Aspiration; Asthma; Chronic Obstructive Pulmonary Disease (COPD); Pneumothorax; Tuberculosis Cardiovascular Medical History: Positive for: Hypertension; Peripheral Venous Disease Negative for: Angina; Arrhythmia; Congestive Heart Failure; Coronary Artery Disease; Deep Vein Thrombosis; Hypotension; Myocardial Infarction; Peripheral Arterial Disease; Phlebitis; Vasculitis Gastrointestinal Medical History: Negative for: Cirrhosis ; Colitis; Crohns; Hepatitis A; Hepatitis B; Hepatitis  C Endocrine Medical History: Negative for: Type I Diabetes; Type II Diabetes Genitourinary Medical History: Negative for: End Stage Renal Disease Past Medical History  Notes: years ago kidney stones Immunological Medical History: Negative for: Lupus Erythematosus; Raynauds; Scleroderma Integumentary (Skin) Medical History: Negative for: History of Burn Hilton CorkWATKINS, Abby H ([id number]) [id number] Page 10 of 11 Musculoskeletal Medical History: Negative for: Gout; Rheumatoid Arthritis; Osteoarthritis; Osteomyelitis Neurologic Medical History: Negative for: Dementia; Neuropathy; Quadriplegia; Paraplegia; Seizure Disorder Oncologic Medical History: Negative for: Received Chemotherapy; Received Radiation Past Medical History Notes: skin Ca removed from back years ago Psychiatric Medical History: Negative for: Anorexia/bulimia; Confinement Anxiety Immunizations Pneumococcal Vaccine: Received Pneumococcal Vaccination: No Implantable Devices None Hospitalization / Surgery History Type of Hospitalization/Surgery cholecystectomy 1980s nephrolithasis 1980s plate and rod in right elbow surgery 2010 Family and Social History Cancer: Yes - Mother; Diabetes: Yes - Father; Heart Disease: No; Hypertension: Yes - Mother; Kidney Disease: Yes - Mother; Lung Disease: Yes - Father; Seizures: No; Stroke: No; Thyroid Problems: Yes - Paternal Grandparents; Tuberculosis: No; Never smoker; Marital Status - Married; Alcohol Use: Never; Drug Use: No History; Caffeine Use: Daily - coffee; Financial Concerns: No; Food, Clothing or Shelter Needs: No; Support System Lacking: No; Transportation Concerns: No Electronic Signature(s) Signed: 07/12/2022 3:34:51 PM By: Duanne Guessannon, Kalisa Girtman MD FACS Entered By: Duanne Guessannon, Keiaira Donlan on 07/12/2022 15:29:34 -------------------------------------------------------------------------------- SuperBill Details Patient Name: Date of Service: [name] [name],  [name] 07/12/2022 Medical Record Number: [id number] Patient Account Number: [id number] Date of Birth/Sex: Treating RN: 04/23/1960 (62 y.o. Fredderick PhenixF) Herrington, Taylor Primary Care Provider: Nira ConnMichael, Barbara Other Clinician: Referring Provider: Treating Provider/Extender: Andrey Campanileannon, Sofiya Ezelle Michael, Barbara Weeks in Treatment: 24 Diagnosis Coding ICD-10 Codes Code Description [phone] Non-pressure chronic ulcer of right calf with fat layer exposed E66.01 Morbid (severe) obesity due to excess calories I10 Essential (primary) hypertension R60.0 Localized edema Facility Procedures : Rodman KeyWATKINS, CPT4 Code: [id number] Romie MinusSURADA H ([id number]) Description: [phone] - DEBRIDE WOUND 1ST 20 SQ CM OR < ICD-10 Diagnosis Description L97.212 Non-pressure chronic ulcer of right calf with fat layer exposed [phone] Modifier: [id number] Quantity: 1 [id number].pdf Page 11 of 11 Physician Procedures : CPT4 Code Description Modifier [id number] - WC PHYS LEVEL 4 - EST PT 25 ICD-10 Diagnosis Description L97.212 Non-pressure chronic ulcer of right calf with fat layer exposed R60.0 Localized edema E66.01 Morbid (severe) obesity due to excess calories  I10 Essential (primary) hypertension Quantity: 1 : [id number] - WC PHYS DEBR WO ANESTH 20 SQ CM ICD-10 Diagnosis Description L97.212 Non-pressure chronic ulcer of right calf with fat layer exposed Quantity: 1 Electronic Signature(s) Signed: 07/12/2022 3:33:39 PM By: Duanne Guessannon, Abdulwahab Demelo MD FACS Previous Signature: 07/12/2022 3:21:47 PM Version By: Samuella BruinHerrington, Taylor Entered By: Duanne Guessannon, Stefano Trulson on 07/12/2022 15:33:39

## 2022-07-19 ENCOUNTER — Encounter (HOSPITAL_BASED_OUTPATIENT_CLINIC_OR_DEPARTMENT_OTHER): Payer: BC Managed Care – PPO | Admitting: General Surgery

## 2022-07-19 DIAGNOSIS — L97212 Non-pressure chronic ulcer of right calf with fat layer exposed: Secondary | ICD-10-CM | POA: Diagnosis not present

## 2022-07-19 NOTE — Progress Notes (Signed)
[name], [name] ([id number]) [id number] Page 1 of 11 Visit Report for 07/19/2022 Chief Complaint Document Details Patient Name: Date of Service: [place], [name]. 07/19/2022 7:30 A M Medical Record Number: [id number] Patient Account Number: [id number] Date of Birth/Sex: Treating RN: 02-19-1961 (62 y.o. F) Primary Care Provider: Nira Conn Other Clinician: Referring Provider: Treating Provider/Extender: Andrey Campanile in Treatment: 25 Information Obtained from: Patient Chief Complaint RLE ulcer Electronic Signature(s) Signed: 07/19/2022 8:10:22 AM By: [name] MD FACS Entered By: [name] on 07/19/2022 08:10:22 -------------------------------------------------------------------------------- Debridement Details Patient Name: Date of Service: [name], [name] 07/19/2022 7:30 A M Medical Record Number: [id number] Patient Account Number: [id number] Date of Birth/Sex: Treating RN: April 28, 1960 (62 y.o. [name] Primary Care Provider: Nira Conn Other Clinician: Referring Provider: Treating Provider/Extender: Andrey Campanile in Treatment: 25 Debridement Performed for Assessment: Wound #2 Right,Posterior Lower Leg Performed By: Physician [name], MD Debridement Type: Debridement Severity of Tissue Pre Debridement: Fat layer exposed Level of Consciousness (Pre-procedure): Awake and Alert Pre-procedure Verification/Time Out Yes - 07:55 Taken: Start Time: 07:56 Pain Control: Lidocaine 4% T opical Solution T Area Debrided (L x W): otal 2.5 (cm) x 2.4 (cm) = 6 (cm) Tissue and other material debrided: Non-Viable, Slough, Slough Level: Non-Viable Tissue Debridement Description: Selective/Open Wound Instrument: Curette Bleeding: Minimum Hemostasis Achieved: Pressure Procedural Pain: 0 Post Procedural Pain: 0 Response to Treatment: Procedure was tolerated  well Level of Consciousness (Post- Awake and Alert procedure): Post Debridement Measurements of Total Wound Length: (cm) 2.5 Width: (cm) 2.4 Depth: (cm) 0.1 Volume: (cm) 0.471 Character of Wound/Ulcer Post Debridement: Improved Severity of Tissue Post Debridement: Fat layer exposed Post Procedure Diagnosis Same as Pre-procedure Notes scribed for Dr. Lady Gary by [name], RN Electronic Signature(s) Signed: 07/19/2022 11:32:38 AM By: [name] MD FACS [name] ([id number]) [id number] Page 2 of 11 Signed: 07/19/2022 4:42:28 PM By: [name] RN, BSN Entered By: [name] on 07/19/2022 08:01:37 -------------------------------------------------------------------------------- HPI Details Patient Name: Date of Service: [name], [name] 07/19/2022 7:30 A M Medical Record Number: [id number] Patient Account Number: [id number] Date of Birth/Sex: Treating RN: 1960-10-02 (62 y.o. F) Primary Care Provider: Nira Conn Other Clinician: Referring Provider: Treating Provider/Extender: Andrey Campanile in Treatment: 25 History of Present Illness HPI Description: 02/16/2020 upon evaluation today patient actually appears to be doing poorly in regard to her left medial lower extremity ulcer. This is actually an area that she tells me she has had intermittent issues with over the years although has been closed for some time she typically uses compression right now she has juxta lite compression wraps. With that being said she tells me that this nonetheless open several weeks/months ago and has been given her trouble since. She does have a history of chronic venous insufficiency she is seeing specialist for this in the past she has had an ablation as well as sclerotherapy. With that being said she also has hypertension chronically which is managed by her primary care provider. In general she seems to be worsening  overall with regard to the wound and states that she finally realized that she needed to come in and have somebody look at this and not continue to try to manage this on her own. No fevers, chills, nausea, vomiting, or diarrhea. 02/23/2020 on evaluation today patient appears to be doing well with regard to her wound. This is showing some signs of improvement which is great news still were  not quite at the point where I would like to be as far as the overall appearance of the wound is concerned but I do believe this is better than last week. I do believe the Iodoflex is helping as well. 03/08/2020 upon evaluation today patient appears to be doing well with regard to her wound. She has been tolerating the dressing changes without complication. Fortunately I feel like she has made great progress with the Iodoflex but I feel like it may be the point rest to switch to something else possibly a collagen- based dressing at this time. 03/15/2020 upon evaluation today patient appears to be doing excellent in regard to her leg ulcer. She has been tolerating the dressing changes without complication. Fortunately there is no signs of active infection. Overall she is measuring a little bit smaller today which is great news. 03/22/2020 upon evaluation today patient appears to be doing well with regard to her wound. She has been tolerating the dressing changes without complication. Fortunately there is no signs of active infection at this time. 03/28/2020; patient I do not usually see however she has a wound on the left anterior lower leg secondary to chronic venous insufficiency we have been using silver collagen under compression. She arrives in clinic with a nonviable surface requiring debridement 04/12/2020 upon evaluation today patient appears to be doing well all things considered with regard to her leg ulcer. She is tolerating the dressing changes without complication there is minimal dry skin around the edges of  the wound that may be trapping and stopping some of the events of the new skin I am can work on that today. Otherwise the surface of the wound appears to be doing excellent. 04/19/2020 upon evaluation today patient appears to be doing well with regard to her leg ulcer. She has been tolerating dressing changes without complication. Fortunately there is no signs of active infection at this time. No fever chills noted overall very pleased with how things seem to be progressing. 04/26/2020 on evaluation today patient appears to be doing well with regard to her wound currently. Is showing signs of excellent improvement overall is filling in nicely and there does not appear to be any signs of infection. No fevers, chills, nausea, vomiting, or diarrhea. 05/03/2020 upon evaluation today patient appears to be doing well with regard to her wound on the leg. This overall showing signs of good improvement which is great she has some good epithelial growth and overall I think that things are moving in the correct direction. We likewise going to continue with the wound care measures as before since she seems to making such good improvement. 2/3; venous wound on the left medial leg. This is contracting. We are using Prisma and 3 layer compression. She has a stocking and waiting in the eventuality this heals. She is already using it on the right 05/17/2020 upon evaluation today patient appears to be doing well with regard to her leg ulcer. She has been tolerating the dressing changes without complication. Fortunately there is no signs of active infection which is great news and overall very pleased with where things stand today. No fevers, chills, nausea, vomiting, or diarrhea. 05/24/2020 upon evaluation today patient appears to be doing well with regard to her wound. Overall I feel like she is making excellent progress. There does not appear to be any signs of active infection which is great news. 05/31/2020 upon  evaluation today patient appears to be doing well with regard to her wound. There  does not appear to be any signs of active infection which is great news overall I am extremely pleased with where things stand today. READMISSION 01/23/2022 She returns to clinic today with a new wound on her right posterior calf. She says that she was cleaning out an old shed near the middle of August this year and then noticed what seemed to be a bug bite on her right posterior calf. It was itchy, red, and raised. By the end of September, an ulcer had developed. She has been applying various topical creams such as hydrocortisone and others to the site. When it was not improving, she made an appointment in the wound care center. ABI in clinic today was 0.97. On her right posterior calf, there is a circular wound with necrotic fat and black eschar present. There is no purulent drainage or malodor. The periwound skin is in good condition with just a little induration that appears to be secondary to inflammation. 01/31/2022: The wound measures a little bit larger today, but overall is quite a bit cleaner. There is some undermining from 9:00 to 3:00. She is having some periwound itching and says that her wrap slid. 02/08/2022: Despite using an Unna boot first layer at the top of her wrap, it slid again and it looks like there is been some bruising at the wound site. The wound is about the same size in terms of dimensions. There is a fair amount of slough and nonviable tissue still present. Edema control is better than last week. 02/15/2022: The wound dimensions are about the same. There is less nonviable tissue present. The periwound erythema has improved. 02/22/2022: The wound has deteriorated over the past week. It is larger and the periwound is more edematous, erythematous, and indurated. It looks as though there has been more tissue breakdown with undermining present. She is having more pain. There is a foul odor coming  from the wound. FELIZA, DIVEN ([id number]) [id number] Page 3 of 11 02/26/2022: The wound looks quite a bit better today and the odor is gone. I still do not have her culture data back, but she seems to be responding well to the Augmentin. 03/06/2022: Her wound continues to improve. There is still some slough accumulation, but the periwound skin is less inflamed. Her culture returned with a polymicrobial population including Pseudomonas and so levofloxacin was also prescribed. She did not understand why a second antibiotic was being added so she has not yet initiated this. 03/14/2022: The wound is about the same, to perhaps a little bit larger. There is a fair amount of slough accumulation on the surface, as well as some hypertrophic granulation tissue. She is still taking levofloxacin, but has completed taking Augmentin. She has her Jodie Echevaria compound with her today. 12/14; the patient has a significant circular wound on the posterior right calf. She is using Keystone and silver alginate under 3 layer compression. Her ABIs were within normal limits at 0.97. This may have been traumatic or an insect bite at the start I reviewed these records. 04/04/2022: Since I last saw the wound, it has contracted considerably. There is a fairly thick layer of slough on the surface, as it has not been debrided since the last time I did it. Edema control is excellent and the periwound skin is in much better condition. 04/12/2022: No significant change in the wound dimensions. It is filling with granulation tissue. Still with slough accumulation on the surface. 04/19/2022: The wound is smaller this week. There is some slough  accumulation on the surface. 04/26/2022: The wound is smaller again this week and significantly cleaner. The wound surface is a little bit drier than ideal. 05/03/2022: The wound measurements were about the same, but visually it appears smaller. There is still some  undermining at the top of the wound. Moisture balance is better this week. 05/10/2022: The wound measured smaller today. There is still a fair amount of undermining present. Slough has built up on the surface. We are still awaiting snap VAC approval. 05/17/2022: The wound is a little bit smaller today. There is less slough on the surface, but the granulation tissue still is not very robust. She has been approved for snap VAC but will have a 20% coinsurance and it is not clear what that amount would end up being for her. 05/27/2022: The surface of the wound has deteriorated and it is gray and fibrotic. No significant odor, but the drainage on her dressing was a little bit purulent. 05/31/2022: The wound looks quite a bit better today. It is still a little fibrotic but no longer has purulent-looking drainage. The color is better. She spoke with her insurance company and is interested in trying the snap VAC, now that she is aware of the cost to her. 06/07/2022: After 1 week in the snap VAC, there has been substantial improvement to her wound. The undermined portion is closing in. The surface has a healthier color and appearance. There is very minimal slough accumulation. 06/14/2022: The more shallow part of the undermined portion of her wound has closed. There is still some undermining from about 1-2 o'clock. The surface continues to improve. Minimal slough accumulation. 06/28/2022: The wound is shallower this week and the undermining has essentially closed and completely. The surface tissue is improving. 07/05/2022: The depth is almost immeasurable and the surface is nearly flush with the surrounding skin. The undermining has been eliminated. There is light slough on the wound surface. 07/12/2022: There is a little bit of slough on the wound surface. The depth is about the same as last week. 07/19/2022: The wound is essentially flush with the surrounding skin surface. There is slight slough present. No real change  in the AP or transverse dimensions. Electronic Signature(s) Signed: 07/19/2022 8:11:05 AM By: [name] MD FACS Entered By: [name] on 07/19/2022 08:11:05 -------------------------------------------------------------------------------- Physical Exam Details Patient Name: Date of Service: [name], [name] 07/19/2022 7:30 A M Medical Record Number: [id number] Patient Account Number: [id number] Date of Birth/Sex: Treating RN: 02/27/1961 (62 y.o. F) Primary Care Provider: Nira Conn Other Clinician: Referring Provider: Treating Provider/Extender: Andrey Campanile in Treatment: 25 Constitutional Slightly hypertensive. . . . no acute distress. Respiratory Normal work of breathing on room air. Notes 07/19/2022: The wound is essentially flush with the surrounding skin surface. There is light slough present. No real change in the AP or transverse dimensions. Electronic Signature(s) Signed: 07/19/2022 8:11:46 AM By: [name] MD FACS Entered By: [name] on 07/19/2022 08:11:46 [name] ([id number]) [id number] Page 4 of 11 -------------------------------------------------------------------------------- Physician Orders Details Patient Name: Date of Service: [name] 07/19/2022 7:30 A M Medical Record Number: [id number] Patient Account Number: [id number] Date of Birth/Sex: Treating RN: 11/28/1960 (62 y.o. [name] Primary Care Provider: Nira Conn Other Clinician: Referring Provider: Treating Provider/Extender: Andrey Campanile in Treatment: 25 Verbal / Phone Orders: No Diagnosis Coding ICD-10 Coding Code Description [phone] Non-pressure chronic ulcer of right calf with fat layer exposed E66.01 Morbid (severe)  obesity due to excess calories I10 Essential (primary) hypertension R60.0 Localized edema Follow-up Appointments ppointment in 1  week. - Dr. Lady Gary RM 1 Return A Friday 4/19 @ 0730 am Anesthetic (In clinic) Topical Lidocaine 4% applied to wound bed Bathing/ Shower/ Hygiene May shower with protection but do not get wound dressing(s) wet. Protect dressing(s) with water repellant cover (for example, large plastic bag) or a cast cover and may then take shower. Edema Control - Lymphedema / SCD / Other Bilateral Lower Extremities Elevate legs to the level of the heart or above for 30 minutes daily and/or when sitting for 3-4 times a day throughout the day. Avoid standing for long periods of time. Exercise regularly Moisturize legs daily. Compression stocking or Garment 20-30 mm/Hg pressure to: - left leg daily. Apply first thing in the morning, remove at night. Wound Treatment Wound #2 - Lower Leg Wound Laterality: Right, Posterior Cleanser: Soap and Water 1 x Per Week/30 Days Discharge Instructions: May shower and wash wound with dial antibacterial soap and water prior to dressing change. Cleanser: Wound Cleanser 1 x Per Week/30 Days Discharge Instructions: Cleanse the wound with wound cleanser prior to applying a clean dressing using gauze sponges, not tissue or cotton balls. Peri-Wound Care: Sween Lotion (Moisturizing lotion) 1 x Per Week/30 Days Discharge Instructions: Apply moisturizing lotion as directed Prim Dressing: Hydrofera Blue Classic Foam, 2x2 in 1 x Per Week/30 Days ary Discharge Instructions: Moisten with saline prior to applying to wound bed Secondary Dressing: ABD Pad, 5x9 1 x Per Week/30 Days Discharge Instructions: Apply over primary dressing as directed. Compression Wrap: Urgo K2 Lite, two layer compression system, regular 1 x Per Week/30 Days Discharge Instructions: Apply Urgo K2 Lite as directed (alternative to 3 layer compression). Electronic Signature(s) Signed: 07/19/2022 11:32:38 AM By: [name] MD FACS Entered By: [name] on 07/19/2022  08:11:59 -------------------------------------------------------------------------------- Problem List Details Patient Name: Date of Service: [name], [name] 07/19/2022 7:30 A M Medical Record Number: [id number] Patient Account Number: [id number] Date of Birth/Sex: Treating RN: 1960-05-15 (62 y.o. [name] Primary Care Provider: Nira Conn Other Clinician: Referring Provider: Treating Provider/Extender: Mayline, Dragon, Romie Minus ([id number]) [id number] Page 5 of 11 Weeks in Treatment: 25 Active Problems ICD-10 Encounter Code Description Active Date MDM Diagnosis L97.212 Non-pressure chronic ulcer of right calf with fat layer exposed 01/23/2022 No Yes E66.01 Morbid (severe) obesity due to excess calories 01/23/2022 No Yes I10 Essential (primary) hypertension 01/23/2022 No Yes R60.0 Localized edema 01/23/2022 No Yes Inactive Problems Resolved Problems Electronic Signature(s) Signed: 07/19/2022 8:10:11 AM By: [name] MD FACS Entered By: [name] on 07/19/2022 08:10:10 -------------------------------------------------------------------------------- Progress Note Details Patient Name: Date of Service: [name], [name] 07/19/2022 7:30 A M Medical Record Number: [id number] Patient Account Number: [id number] Date of Birth/Sex: Treating RN: [birth year] (62 y.o. F) Primary Care Provider: Nira Conn Other Clinician: Referring Provider: Treating Provider/Extender: Andrey Campanile in Treatment: 25 Subjective Chief Complaint Information obtained from Patient RLE ulcer History of Present Illness (HPI) 02/16/2020 upon evaluation today patient actually appears to be doing poorly in regard to her left medial lower extremity ulcer. This is actually an area that she tells me she has had intermittent issues with over the years although has been closed for some time she  typically uses compression right now she has juxta lite compression wraps. With that being said she tells me that this nonetheless open several weeks/months ago and has been given her trouble  since. She does have a history of chronic venous insufficiency she is seeing specialist for this in the past she has had an ablation as well as sclerotherapy. With that being said she also has hypertension chronically which is managed by her primary care provider. In general she seems to be worsening overall with regard to the wound and states that she finally realized that she needed to come in and have somebody look at this and not continue to try to manage this on her own. No fevers, chills, nausea, vomiting, or diarrhea. 02/23/2020 on evaluation today patient appears to be doing well with regard to her wound. This is showing some signs of improvement which is great news still were not quite at the point where I would like to be as far as the overall appearance of the wound is concerned but I do believe this is better than last week. I do believe the Iodoflex is helping as well. 03/08/2020 upon evaluation today patient appears to be doing well with regard to her wound. She has been tolerating the dressing changes without complication. Fortunately I feel like she has made great progress with the Iodoflex but I feel like it may be the point rest to switch to something else possibly a collagen- based dressing at this time. 03/15/2020 upon evaluation today patient appears to be doing excellent in regard to her leg ulcer. She has been tolerating the dressing changes without complication. Fortunately there is no signs of active infection. Overall she is measuring a little bit smaller today which is great news. 03/22/2020 upon evaluation today patient appears to be doing well with regard to her wound. She has been tolerating the dressing changes without complication. Fortunately there is no signs of active infection at  this time. 03/28/2020; patient I do not usually see however she has a wound on the left anterior lower leg secondary to chronic venous insufficiency we have been using silver collagen under compression. She arrives in clinic with a nonviable surface requiring debridement 04/12/2020 upon evaluation today patient appears to be doing well all things considered with regard to her leg ulcer. She is tolerating the dressing changes without complication there is minimal dry skin around the edges of the wound that may be trapping and stopping some of the events of the new skin I am can work on that today. Otherwise the surface of the wound appears to be doing excellent. SHYANE, FOSSUM ([id number]) [id number] Page 6 of 11 04/19/2020 upon evaluation today patient appears to be doing well with regard to her leg ulcer. She has been tolerating dressing changes without complication. Fortunately there is no signs of active infection at this time. No fever chills noted overall very pleased with how things seem to be progressing. 04/26/2020 on evaluation today patient appears to be doing well with regard to her wound currently. Is showing signs of excellent improvement overall is filling in nicely and there does not appear to be any signs of infection. No fevers, chills, nausea, vomiting, or diarrhea. 05/03/2020 upon evaluation today patient appears to be doing well with regard to her wound on the leg. This overall showing signs of good improvement which is great she has some good epithelial growth and overall I think that things are moving in the correct direction. We likewise going to continue with the wound care measures as before since she seems to making such good improvement. 2/3; venous wound on the left medial leg. This is contracting. We are using Prisma and  3 layer compression. She has a stocking and waiting in the eventuality this heals. She is already using it on the  right 05/17/2020 upon evaluation today patient appears to be doing well with regard to her leg ulcer. She has been tolerating the dressing changes without complication. Fortunately there is no signs of active infection which is great news and overall very pleased with where things stand today. No fevers, chills, nausea, vomiting, or diarrhea. 05/24/2020 upon evaluation today patient appears to be doing well with regard to her wound. Overall I feel like she is making excellent progress. There does not appear to be any signs of active infection which is great news. 05/31/2020 upon evaluation today patient appears to be doing well with regard to her wound. There does not appear to be any signs of active infection which is great news overall I am extremely pleased with where things stand today. READMISSION 01/23/2022 She returns to clinic today with a new wound on her right posterior calf. She says that she was cleaning out an old shed near the middle of August this year and then noticed what seemed to be a bug bite on her right posterior calf. It was itchy, red, and raised. By the end of September, an ulcer had developed. She has been applying various topical creams such as hydrocortisone and others to the site. When it was not improving, she made an appointment in the wound care center. ABI in clinic today was 0.97. On her right posterior calf, there is a circular wound with necrotic fat and black eschar present. There is no purulent drainage or malodor. The periwound skin is in good condition with just a little induration that appears to be secondary to inflammation. 01/31/2022: The wound measures a little bit larger today, but overall is quite a bit cleaner. There is some undermining from 9:00 to 3:00. She is having some periwound itching and says that her wrap slid. 02/08/2022: Despite using an Unna boot first layer at the top of her wrap, it slid again and it looks like there is been some bruising at  the wound site. The wound is about the same size in terms of dimensions. There is a fair amount of slough and nonviable tissue still present. Edema control is better than last week. 02/15/2022: The wound dimensions are about the same. There is less nonviable tissue present. The periwound erythema has improved. 02/22/2022: The wound has deteriorated over the past week. It is larger and the periwound is more edematous, erythematous, and indurated. It looks as though there has been more tissue breakdown with undermining present. She is having more pain. There is a foul odor coming from the wound. 02/26/2022: The wound looks quite a bit better today and the odor is gone. I still do not have her culture data back, but she seems to be responding well to the Augmentin. 03/06/2022: Her wound continues to improve. There is still some slough accumulation, but the periwound skin is less inflamed. Her culture returned with a polymicrobial population including Pseudomonas and so levofloxacin was also prescribed. She did not understand why a second antibiotic was being added so she has not yet initiated this. 03/14/2022: The wound is about the same, to perhaps a little bit larger. There is a fair amount of slough accumulation on the surface, as well as some hypertrophic granulation tissue. She is still taking levofloxacin, but has completed taking Augmentin. She has her Jodie Echevaria compound with her today. 12/14; the patient has a significant  circular wound on the posterior right calf. She is using [place] and silver alginate under 3 layer compression. Her ABIs were within normal limits at 0.97. This may have been traumatic or an insect bite at the start I reviewed these records. 04/04/2022: Since I last saw the wound, it has contracted considerably. There is a fairly thick layer of slough on the surface, as it has not been debrided since the last time I did it. Edema control is excellent and the periwound skin is in  much better condition. 04/12/2022: No significant change in the wound dimensions. It is filling with granulation tissue. Still with slough accumulation on the surface. 04/19/2022: The wound is smaller this week. There is some slough accumulation on the surface. 04/26/2022: The wound is smaller again this week and significantly cleaner. The wound surface is a little bit drier than ideal. 05/03/2022: The wound measurements were about the same, but visually it appears smaller. There is still some undermining at the top of the wound. Moisture balance is better this week. 05/10/2022: The wound measured smaller today. There is still a fair amount of undermining present. Slough has built up on the surface. We are still awaiting snap VAC approval. 05/17/2022: The wound is a little bit smaller today. There is less slough on the surface, but the granulation tissue still is not very robust. She has been approved for snap VAC but will have a 20% coinsurance and it is not clear what that amount would end up being for her. 05/27/2022: The surface of the wound has deteriorated and it is gray and fibrotic. No significant odor, but the drainage on her dressing was a little bit purulent. 05/31/2022: The wound looks quite a bit better today. It is still a little fibrotic but no longer has purulent-looking drainage. The color is better. She spoke with her insurance company and is interested in trying the snap VAC, now that she is aware of the cost to her. 06/07/2022: After 1 week in the snap VAC, there has been substantial improvement to her wound. The undermined portion is closing in. The surface has a healthier color and appearance. There is very minimal slough accumulation. 06/14/2022: The more shallow part of the undermined portion of her wound has closed. There is still some undermining from about 1-2 o'clock. The surface continues to improve. Minimal slough accumulation. 06/28/2022: The wound is shallower this week and the  undermining has essentially closed and completely. The surface tissue is improving. 07/05/2022: The depth is almost immeasurable and the surface is nearly flush with the surrounding skin. The undermining has been eliminated. There is light slough on the wound surface. 07/12/2022: There is a little bit of slough on the wound surface. The depth is about the same as last week. 07/19/2022: The wound is essentially flush with the surrounding skin surface. There is slight slough present. No real change in the AP or transverse dimensions. DEONDRA, LABRADOR ([id number]) [id number] Page 7 of 11 Patient History Information obtained from Patient. Family History Cancer - Mother, Diabetes - Father, Hypertension - Mother, Kidney Disease - Mother, Lung Disease - Father, Thyroid Problems - Paternal Grandparents, No family history of Heart Disease, Seizures, Stroke, Tuberculosis. Social History Never smoker, Marital Status - Married, Alcohol Use - Never, Drug Use - No History, Caffeine Use - Daily - coffee. Medical History Eyes Denies history of Cataracts, Glaucoma, Optic Neuritis Ear/Nose/Mouth/Throat Denies history of Chronic sinus problems/congestion, Middle ear problems Hematologic/Lymphatic Patient has history of Anemia - iron Denies history  of Hemophilia, Human Immunodeficiency Virus, Lymphedema, Sickle Cell Disease Respiratory Patient has history of Sleep Apnea - CPAP Denies history of Aspiration, Asthma, Chronic Obstructive Pulmonary Disease (COPD), Pneumothorax, Tuberculosis Cardiovascular Patient has history of Hypertension, Peripheral Venous Disease Denies history of Angina, Arrhythmia, Congestive Heart Failure, Coronary Artery Disease, Deep Vein Thrombosis, Hypotension, Myocardial Infarction, Peripheral Arterial Disease, Phlebitis, Vasculitis Gastrointestinal Denies history of Cirrhosis , Colitis, Crohnoos, Hepatitis A, Hepatitis B, Hepatitis C Endocrine Denies  history of Type I Diabetes, Type II Diabetes Genitourinary Denies history of End Stage Renal Disease Immunological Denies history of Lupus Erythematosus, Raynaudoos, Scleroderma Integumentary (Skin) Denies history of History of Burn Musculoskeletal Denies history of Gout, Rheumatoid Arthritis, Osteoarthritis, Osteomyelitis Neurologic Denies history of Dementia, Neuropathy, Quadriplegia, Paraplegia, Seizure Disorder Oncologic Denies history of Received Chemotherapy, Received Radiation Psychiatric Denies history of Anorexia/bulimia, Confinement Anxiety Hospitalization/Surgery History - cholecystectomy 1980s. - nephrolithasis 1980s. - plate and rod in right elbow surgery 2010. Medical A Surgical History Notes nd Genitourinary years ago kidney stones Oncologic skin Ca removed from back years ago Objective Constitutional Slightly hypertensive. no acute distress. Vitals Time Taken: 7:40 AM, Height: 61 in, Weight: 277 lbs, BMI: 52.3, Temperature: 98.2 F, Pulse: 84 bpm, Respiratory Rate: 20 breaths/min, Blood Pressure: 141/85 mmHg. Respiratory Normal work of breathing on room air. General Notes: 07/19/2022: The wound is essentially flush with the surrounding skin surface. There is light slough present. No real change in the AP or transverse dimensions. Integumentary (Hair, Skin) Wound #2 status is Open. Original cause of wound was Insect Bite. The date acquired was: 11/21/2021. The wound has been in treatment 25 weeks. The wound is located on the Right,Posterior Lower Leg. The wound measures 2.5cm length x 2.4cm width x 0.1cm depth; 4.712cm^2 area and 0.471cm^3 volume. There is Fat Layer (Subcutaneous Tissue) exposed. There is no tunneling or undermining noted. There is a medium amount of serous drainage noted. The wound margin is distinct with the outline attached to the wound base. There is small (1-33%) red, pink granulation within the wound bed. There is a large (67-100%) amount  of necrotic tissue within the wound bed including Adherent Slough. The periwound skin appearance had no abnormalities noted for texture. The periwound skin appearance had no abnormalities noted for moisture. The periwound skin appearance exhibited: Hemosiderin Staining. The periwound skin appearance did not exhibit: Atrophie Blanche, Ecchymosis, Rubor, Erythema. Periwound temperature was noted as No Abnormality. LABRESHA, MELLOR ([id number]) [id number] Page 8 of 11 Assessment Active Problems ICD-10 Non-pressure chronic ulcer of right calf with fat layer exposed Morbid (severe) obesity due to excess calories Essential (primary) hypertension Localized edema Procedures Wound #2 Pre-procedure diagnosis of Wound #2 is a Venous Leg Ulcer located on the Right,Posterior Lower Leg .Severity of Tissue Pre Debridement is: Fat layer exposed. There was a Selective/Open Wound Non-Viable Tissue Debridement with a total area of 6 sq cm performed by [name], MD. With the following instrument(s): Curette to remove Non-Viable tissue/material. Material removed includes [facility] after achieving pain control using Lidocaine 4% Topical Solution. No specimens were taken. A time out was conducted at 07:55, prior to the start of the procedure. A Minimum amount of bleeding was controlled with Pressure. The procedure was tolerated well with a pain level of 0 throughout and a pain level of 0 following the procedure. Post Debridement Measurements: 2.5cm length x 2.4cm width x 0.1cm depth; 0.471cm^3 volume. Character of Wound/Ulcer Post Debridement is improved. Severity of Tissue Post Debridement is: Fat layer exposed. Post procedure Diagnosis Wound #2:  Same as Pre-Procedure General Notes: scribed for Dr. Lady Gary by [name], RN. Pre-procedure diagnosis of Wound #2 is a Venous Leg Ulcer located on the Right,Posterior Lower Leg . There was a Double Layer Compression Therapy Procedure  by [name], RN. Post procedure Diagnosis Wound #2: Same as Pre-Procedure Notes: urgo lite. Plan Follow-up Appointments: Return Appointment in 1 week. - Dr. Lady Gary RM 1 Friday 4/19 @ 0730 am Anesthetic: (In clinic) Topical Lidocaine 4% applied to wound bed Bathing/ Shower/ Hygiene: May shower with protection but do not get wound dressing(s) wet. Protect dressing(s) with water repellant cover (for example, large plastic bag) or a cast cover and may then take shower. Edema Control - Lymphedema / SCD / Other: Elevate legs to the level of the heart or above for 30 minutes daily and/or when sitting for 3-4 times a day throughout the day. Avoid standing for long periods of time. Exercise regularly Moisturize legs daily. Compression stocking or Garment 20-30 mm/Hg pressure to: - left leg daily. Apply first thing in the morning, remove at night. WOUND #2: - Lower Leg Wound Laterality: Right, Posterior Cleanser: Soap and Water 1 x Per Week/30 Days Discharge Instructions: May shower and wash wound with dial antibacterial soap and water prior to dressing change. Cleanser: Wound Cleanser 1 x Per Week/30 Days Discharge Instructions: Cleanse the wound with wound cleanser prior to applying a clean dressing using gauze sponges, not tissue or cotton balls. Peri-Wound Care: Sween Lotion (Moisturizing lotion) 1 x Per Week/30 Days Discharge Instructions: Apply moisturizing lotion as directed Prim Dressing: Hydrofera Blue Classic Foam, 2x2 in 1 x Per Week/30 Days ary Discharge Instructions: Moisten with saline prior to applying to wound bed Secondary Dressing: ABD Pad, 5x9 1 x Per Week/30 Days Discharge Instructions: Apply over primary dressing as directed. Com pression Wrap: Urgo K2 Lite, two layer compression system, regular 1 x Per Week/30 Days Discharge Instructions: Apply Urgo K2 Lite as directed (alternative to 3 layer compression). 07/19/2022: The wound is essentially flush with the  surrounding skin surface. There is slight slough present. No real change in the AP or transverse dimensions. I used a curette to debride the slough from the surface. At this point, I think the snap VAC has served its purpose. We will change her contact layer to [place]. Continue 3 layer equivalent compression. Follow-up in 1 week. Electronic Signature(s) Signed: 07/19/2022 8:12:35 AM By: [name] MD FACS Entered By: [name] on 07/19/2022 08:12:34 [name] ([id number]) [id number] Page 9 of 11 -------------------------------------------------------------------------------- HxROS Details Patient Name: Date of Service: [name] 07/19/2022 7:30 A M Medical Record Number: [id number] Patient Account Number: [id number] Date of Birth/Sex: Treating RN: July 20, 1960 (62 y.o. F) Primary Care Provider: Nira Conn Other Clinician: Referring Provider: Treating Provider/Extender: Andrey Campanile in Treatment: 25 Information Obtained From Patient Eyes Medical History: Negative for: Cataracts; Glaucoma; Optic Neuritis Ear/Nose/Mouth/Throat Medical History: Negative for: Chronic sinus problems/congestion; Middle ear problems Hematologic/Lymphatic Medical History: Positive for: Anemia - iron Negative for: Hemophilia; Human Immunodeficiency Virus; Lymphedema; Sickle Cell Disease Respiratory Medical History: Positive for: Sleep Apnea - CPAP Negative for: Aspiration; Asthma; Chronic Obstructive Pulmonary Disease (COPD); Pneumothorax; Tuberculosis Cardiovascular Medical History: Positive for: Hypertension; Peripheral Venous Disease Negative for: Angina; Arrhythmia; Congestive Heart Failure; Coronary Artery Disease; Deep Vein Thrombosis; Hypotension; Myocardial Infarction; Peripheral Arterial Disease; Phlebitis; Vasculitis Gastrointestinal Medical History: Negative for: Cirrhosis ; Colitis; Crohns;  Hepatitis A; Hepatitis B; Hepatitis C Endocrine Medical History: Negative for: Type I Diabetes;  Type II Diabetes Genitourinary Medical History: Negative for: End Stage Renal Disease Past Medical History Notes: years ago kidney stones Immunological Medical History: Negative for: Lupus Erythematosus; Raynauds; Scleroderma Integumentary (Skin) Medical History: Negative for: History of Burn Musculoskeletal Medical History: Negative for: Gout; Rheumatoid Arthritis; Osteoarthritis; Osteomyelitis Neurologic Medical History: Negative for: Dementia; Neuropathy; Quadriplegia; Paraplegia; Seizure Disorder CHANNEL, PAPANDREA ([id number]) [id number] Page 10 of 11 Oncologic Medical History: Negative for: Received Chemotherapy; Received Radiation Past Medical History Notes: skin Ca removed from back years ago Psychiatric Medical History: Negative for: Anorexia/bulimia; Confinement Anxiety Immunizations Pneumococcal Vaccine: Received Pneumococcal Vaccination: No Implantable Devices None Hospitalization / Surgery History Type of Hospitalization/Surgery cholecystectomy 1980s nephrolithasis 1980s plate and rod in right elbow surgery 2010 Family and Social History Cancer: Yes - Mother; Diabetes: Yes - Father; Heart Disease: No; Hypertension: Yes - Mother; Kidney Disease: Yes - Mother; Lung Disease: Yes - Father; Seizures: No; Stroke: No; Thyroid Problems: Yes - Paternal Grandparents; Tuberculosis: No; Never smoker; Marital Status - Married; Alcohol Use: Never; Drug Use: No History; Caffeine Use: Daily - coffee; Financial Concerns: No; Food, Clothing or Shelter Needs: No; Support System Lacking: No; Transportation Concerns: No Electronic Signature(s) Signed: 07/19/2022 11:32:38 AM By: [name] MD FACS Entered By: [name] on 07/19/2022 08:11:11 -------------------------------------------------------------------------------- SuperBill  Details Patient Name: Date of Service: [name], [name] 07/19/2022 Medical Record Number: [id number] Patient Account Number: [id number] Date of Birth/Sex: Treating RN: [birth year] (62 y.o. F) Primary Care Provider: Nira Conn Other Clinician: Referring Provider: Treating Provider/Extender: Andrey Campanile in Treatment: 25 Diagnosis Coding ICD-10 Codes Code Description [phone] Non-pressure chronic ulcer of right calf with fat layer exposed E66.01 Morbid (severe) obesity due to excess calories I10 Essential (primary) hypertension R60.0 Localized edema Facility Procedures : CPT4 Code: [id number] Description: [id number] - DEBRIDE WOUND 1ST 20 SQ CM OR < ICD-10 Diagnosis Description L97.212 Non-pressure chronic ulcer of right calf with fat layer exposed Modifier: Quantity: 1 Physician Procedures : CPT4 Code Description Modifier [id number] - WC PHYS LEVEL 3 - EST PT 25 ICD-10 Diagnosis Description L97.212 Non-pressure chronic ulcer of right calf with fat layer exposed I10 Essential (primary) hypertension R60.0 Localized edema E66.01 Morbid  (severe) obesity due to excess calories CONSUELA, WIDENER ([id number]) [id number] Quantity: 1 [id number].pdf Page 11 of 11 : [id number] - WC PHYS DEBR WO ANESTH 20 SQ CM 1 ICD-10 Diagnosis Description L97.212 Non-pressure chronic ulcer of right calf with fat layer exposed Quantity: Electronic Signature(s) Signed: 07/19/2022 8:12:50 AM By: [name] MD FACS Entered By: [name] on 07/19/2022 08:12:50

## 2022-07-19 NOTE — Progress Notes (Signed)
REIDA, HEM ([id number]) [id number] Page 1 of 7 Visit Report for 07/19/2022 Arrival Information Details Patient Name: Date of Service: [place], [name] H. 07/19/2022 7:30 A M Medical Record Number: [id number] Patient Account Number: [id number] Date of Birth/Sex: Treating RN: 11/29/1960 (62 y.o. [name], [name] Primary Care Lew Prout: Nira Conn Other Clinician: Referring Olyver Hawes: Treating Eeshan Verbrugge/Extender: Andrey Campanile in Treatment: 25 Visit Information History Since Last Visit Added or deleted any medications: No Patient Arrived: Ambulatory Any new allergies or adverse reactions: No Arrival Time: 07:38 Had a fall or experienced change in No Accompanied By: self activities of daily living that may affect Transfer Assistance: None risk of falls: Patient Identification Verified: Yes Signs or symptoms of abuse/neglect since last visito No Secondary Verification Process Completed: Yes Hospitalized since last visit: No Patient Requires Transmission-Based Precautions: No Implantable device outside of the clinic excluding No Patient Has Alerts: No cellular tissue based products placed in the center since last visit: Has Dressing in Place as Prescribed: Yes Has Compression in Place as Prescribed: Yes Pain Present Now: No Electronic Signature(s) Signed: 07/19/2022 4:42:28 PM By: [name] RN, BSN Entered By: [name] on 07/19/2022 07:40:13 -------------------------------------------------------------------------------- Compression Therapy Details Patient Name: Date of Service: [name], [name] 07/19/2022 7:30 A M Medical Record Number: [id number] Patient Account Number: [id number] Date of Birth/Sex: Treating RN: September 13, 1960 (62 y.o. [name] Primary Care Adriana Quinby: Nira Conn Other Clinician: Referring Jaselyn Nahm: Treating Messiah Ahr/Extender: Andrey Campanile in  Treatment: 25 Compression Therapy Performed for Wound Assessment: Wound #2 Right,Posterior Lower Leg Performed By: Clinician [name], RN Compression Type: Double Layer Post Procedure Diagnosis Same as Pre-procedure Notes Stephannie Li Electronic Signature(s) Signed: 07/19/2022 4:42:28 PM By: [name] RN, BSN Entered By: [name] on 07/19/2022 07:58:56 -------------------------------------------------------------------------------- Encounter Discharge Information Details Patient Name: Date of Service: [place], [name] 07/19/2022 7:30 A M Medical Record Number: [id number] Patient Account Number: [id number] Date of Birth/Sex: Treating RN: 07-Jun-1960 (62 y.o. [name] Primary Care Shataya Winkles: Nira Conn Other Clinician: Referring Reeya Bound: Treating Dean Wonder/Extender: Andrey Campanile in Treatment: 25 Encounter Discharge Information Items Post Procedure Vitals Discharge Condition: Stable Temperature (F): 98.2 GUILIANA, SHOR ([id number]) [id number] Page 2 of 7 Ambulatory Status: Ambulatory Pulse (bpm): 84 Discharge Destination: Home Respiratory Rate (breaths/min): 18 Transportation: Private Auto Blood Pressure (mmHg): 141/85 Accompanied By: self Schedule Follow-up Appointment: Yes Clinical Summary of Care: Patient Declined Electronic Signature(s) Signed: 07/19/2022 4:42:28 PM By: [name] RN, BSN Entered By: [name] on 07/19/2022 08:20:42 -------------------------------------------------------------------------------- Lower Extremity Assessment Details Patient Name: Date of Service: [place], [name] 07/19/2022 7:30 A M Medical Record Number: [id number] Patient Account Number: [id number] Date of Birth/Sex: Treating RN: Mar 10, 1961 (62 y.o. [name] Primary Care Millenia Waldvogel: Nira Conn Other Clinician: Referring Arrington Yohe: Treating Bekah Igoe/Extender: Andrey Campanile in Treatment: 25 Edema Assessment Assessed: [name]: No] [Right: No] Edema: [Left: Ye] [Right: s] Calf Left: Right: Point of Measurement: From Medial Instep 39 cm Ankle Left: Right: Point of Measurement: From Medial Instep 23 cm Vascular Assessment Pulses: Dorsalis Pedis Palpable: [Right:Yes] Electronic Signature(s) Signed: 07/19/2022 4:42:28 PM By: [name] RN, BSN Entered By: [name] on 07/19/2022 07:44:55 -------------------------------------------------------------------------------- Multi Wound Chart Details Patient Name: Date of Service: [name], [name] 07/19/2022 7:30 A M Medical Record Number: [id number] Patient Account Number: [id number] Date of Birth/Sex: Treating RN: [birth year] (62 y.o. F) Primary Care Kregg Cihlar: Nira Conn Other Clinician: Referring  Yisroel Mullendore: Treating Laquanna Veazey/Extender: Andrey Campanile in Treatment: 25 Vital Signs Height(in): 61 Pulse(bpm): 84 Weight(lbs): 277 Blood Pressure(mmHg): 141/85 Body Mass Index(BMI): 52.3 Temperature(F): 98.2 Respiratory Rate(breaths/min): 20 [2:Photos:] [N/A:N/A] Right, Posterior Lower Leg N/A N/A Wound Location: Insect Bite N/A N/A Wounding Event: Venous Leg Ulcer N/A N/A Primary Etiology: Anemia, Sleep Apnea, Hypertension, N/A N/A Comorbid History: Peripheral Venous Disease 11/21/2021 N/A N/A Date Acquired: 25 N/A N/A Weeks of Treatment: Open N/A N/A Wound Status: No N/A N/A Wound Recurrence: 2.5x2.4x0.1 N/A N/A Measurements L x W x D (cm) 4.712 N/A N/A A (cm) : rea 0.471 N/A N/A Volume (cm) : -1327.90% N/A N/A % Reduction in A rea: -375.80% N/A N/A % Reduction in Volume: Full Thickness Without Exposed N/A N/A Classification: Support Structures Medium N/A N/A Exudate A mount: Serous N/A N/A Exudate Type: amber N/A N/A Exudate Color: Distinct, outline attached N/A N/A Wound Margin: Small (1-33%) N/A  N/A Granulation A mount: Red, Pink N/A N/A Granulation Quality: Large (67-100%) N/A N/A Necrotic A mount: Fat Layer (Subcutaneous Tissue): Yes N/A N/A Exposed Structures: Fascia: No Tendon: No Muscle: No Joint: No Bone: No Small (1-33%) N/A N/A Epithelialization: Debridement - Selective/Open Wound N/A N/A Debridement: Pre-procedure Verification/Time Out 07:55 N/A N/A Taken: Lidocaine 4% Topical Solution N/A N/A Pain Control: Slough N/A N/A Tissue Debrided: Non-Viable Tissue N/A N/A Level: 6 N/A N/A Debridement A (sq cm): rea Curette N/A N/A Instrument: Minimum N/A N/A Bleeding: Pressure N/A N/A Hemostasis A chieved: 0 N/A N/A Procedural Pain: 0 N/A N/A Post Procedural Pain: Procedure was tolerated well N/A N/A Debridement Treatment Response: 2.5x2.4x0.1 N/A N/A Post Debridement Measurements L x W x D (cm) 0.471 N/A N/A Post Debridement Volume: (cm) Excoriation: No N/A N/A Periwound Skin Texture: Scarring: No No Abnormalities Noted N/A N/A Periwound Skin Moisture: Hemosiderin Staining: Yes N/A N/A Periwound Skin Color: Atrophie Blanche: No Ecchymosis: No Erythema: No Rubor: No No Abnormality N/A N/A Temperature: Compression Therapy N/A N/A Procedures Performed: Debridement Treatment Notes Electronic Signature(s) Signed: 07/19/2022 8:10:16 AM By: [name] MD FACS Entered By: [name] on 07/19/2022 08:10:16 -------------------------------------------------------------------------------- Multi-Disciplinary Care Plan Details Patient Name: Date of Service: [place], [name] 07/19/2022 7:30 A M Medical Record Number: [id number] Patient Account Number: [id number] Date of Birth/Sex: Treating RN: September 30, 1960 (62 y.o. [name] Primary Care Lisle Skillman: Nira Conn Other Clinician: Referring Tandi Hanko: Treating Vincent Ehrler/Extender: Andrey Campanile in Treatment: [address] [place] H ([id number])  [id number] Page 4 of 7 Multidisciplinary Care Plan reviewed with physician Active Inactive Necrotic Tissue Nursing Diagnoses: Impaired tissue integrity related to necrotic/devitalized tissue Knowledge deficit related to management of necrotic/devitalized tissue Goals: Necrotic/devitalized tissue will be minimized in the wound bed Date Initiated: 01/23/2022 Target Resolution Date: 08/02/2022 Goal Status: Active Patient/caregiver will verbalize understanding of reason and process for debridement of necrotic tissue Date Initiated: 01/23/2022 Target Resolution Date: 08/02/2022 Goal Status: Active Interventions: Assess patient pain level pre-, during and post procedure and prior to discharge Provide education on necrotic tissue and debridement process Treatment Activities: Apply topical anesthetic as ordered : 01/23/2022 Notes: Venous Leg Ulcer Nursing Diagnoses: Knowledge deficit related to disease process and management Potential for venous Insuffiency (use before diagnosis confirmed) Goals: Patient will maintain optimal edema control Date Initiated: 07/05/2022 Target Resolution Date: 08/02/2022 Goal Status: Active Interventions: Assess peripheral edema status every visit. Compression as ordered Provide education on venous insufficiency Treatment Activities: Therapeutic compression applied : 07/05/2022 Notes: Wound/Skin Impairment Nursing Diagnoses: Impaired tissue integrity Knowledge deficit related  to ulceration/compromised skin integrity Goals: Patient/caregiver will verbalize understanding of skin care regimen Date Initiated: 01/23/2022 Target Resolution Date: 08/02/2022 Goal Status: Active Interventions: Assess patient/caregiver ability to obtain necessary supplies Assess ulceration(s) every visit Treatment Activities: Skin care regimen initiated : 01/23/2022 Topical wound management initiated : 01/23/2022 Notes: Electronic  Signature(s) Signed: 07/19/2022 4:42:28 PM By: [name] RN, BSN Entered By: [name] on 07/19/2022 07:53:45 [name] ([id number]) [id number] Page 5 of 7 -------------------------------------------------------------------------------- Pain Assessment Details Patient Name: Date of Service: [place], [name] 07/19/2022 7:30 A M Medical Record Number: [id number] Patient Account Number: [id number] Date of Birth/Sex: Treating RN: [birth year] (62 y.o. [name] Primary Care Hartman Minahan: Nira Conn Other Clinician: Referring Kingstyn Deruiter: Treating Mechelle Pates/Extender: Andrey Campanile in Treatment: 25 Active Problems Location of Pain Severity and Description of Pain Patient Has Paino No Site Locations Rate the pain. Current Pain Level: 0 Pain Management and Medication Current Pain Management: Electronic Signature(s) Signed: 07/19/2022 4:42:28 PM By: [name] RN, BSN Entered By: [name] on 07/19/2022 07:40:43 -------------------------------------------------------------------------------- Patient/Caregiver Education Details Patient Name: Date of Service: [name], [name] [name] 4/12/2024andnbsp7:30 A M Medical Record Number: [id number] Patient Account Number: [id number] Date of Birth/Gender: Treating RN: 1960/11/19 (62 y.o. [name] Primary Care Physician: Nira Conn Other Clinician: Referring Physician: Treating Physician/Extender: Andrey Campanile in Treatment: 25 Education Assessment Education Provided To: Patient Education Topics Provided Venous: Methods: Explain/Verbal Responses: Reinforcements needed, State content correctly Wound/Skin Impairment: Methods: Explain/Verbal Responses: Reinforcements needed, State content correctly Electronic Signature(s) Signed: 07/19/2022 4:42:28 PM By: [name] RN, BSN Entered By: [name] on  07/19/2022 07:54:11 [name] ([id number]) [id number] Page 6 of 7 -------------------------------------------------------------------------------- Wound Assessment Details Patient Name: Date of Service: [place], [name] 07/19/2022 7:30 A M Medical Record Number: [id number] Patient Account Number: [id number] Date of Birth/Sex: Treating RN: 01-14-1961 (62 y.o. [name] Primary Care Essynce Munsch: Nira Conn Other Clinician: Referring Belva Koziel: Treating Sonjia Wilcoxson/Extender: Andrey Campanile in Treatment: 25 Wound Status Wound Number: 2 Primary Venous Leg Ulcer Etiology: Wound Location: Right, Posterior Lower Leg Wound Status: Open Wounding Event: Insect Bite Comorbid Anemia, Sleep Apnea, Hypertension, Peripheral Venous Date Acquired: 11/21/2021 History: Disease Weeks Of Treatment: 25 Clustered Wound: No Photos Wound Measurements Length: (cm) 2.5 Width: (cm) 2.4 Depth: (cm) 0.1 Area: (cm) 4.712 Volume: (cm) 0.471 % Reduction in Area: -1327.9% % Reduction in Volume: -375.8% Epithelialization: Small (1-33%) Tunneling: No Undermining: No Wound Description Classification: Full Thickness Without Exposed Suppor Wound Margin: Distinct, outline attached Exudate Amount: Medium Exudate Type: Serous Exudate Color: amber t Structures Foul Odor After Cleansing: No Slough/Fibrino Yes Wound Bed Granulation Amount: Small (1-33%) Exposed Structure Granulation Quality: Red, Pink Fascia Exposed: No Necrotic Amount: Large (67-100%) Fat Layer (Subcutaneous Tissue) Exposed: Yes Necrotic Quality: Adherent Slough Tendon Exposed: No Muscle Exposed: No Joint Exposed: No Bone Exposed: No Periwound Skin Texture Texture Color No Abnormalities Noted: Yes No Abnormalities Noted: No Atrophie Blanche: No Moisture Ecchymosis: No No Abnormalities Noted: Yes Erythema: No Hemosiderin Staining: Yes Rubor: No Temperature /  Pain Temperature: No Abnormality Treatment Notes Wound #2 (Lower Leg) Wound Laterality: Right, Posterior ROSSLYNN, TENNIS ([id number]) [id number] Page 7 of 7 Soap and Water Discharge Instruction: May shower and wash wound with dial antibacterial soap and water prior to dressing change. Wound Cleanser Discharge Instruction: Cleanse the wound with wound cleanser prior to applying a clean dressing using gauze sponges, not tissue or cotton balls. Peri-Wound  Care Sween Lotion (Moisturizing lotion) Discharge Instruction: Apply moisturizing lotion as directed Topical Primary Dressing Hydrofera Blue Classic Foam, 2x2 in Discharge Instruction: Moisten with saline prior to applying to wound bed Secondary Dressing ABD Pad, 5x9 Discharge Instruction: Apply over primary dressing as directed. Secured With Compression Wrap Urgo K2 Lite, two layer compression system, regular Discharge Instruction: Apply Urgo K2 Lite as directed (alternative to 3 layer compression). Compression Stockings Add-Ons Electronic Signature(s) Signed: 07/19/2022 4:42:28 PM By: [name] RN, BSN Entered By: [name] on 07/19/2022 07:50:54 -------------------------------------------------------------------------------- Vitals Details Patient Name: Date of Service: [name], [name] 07/19/2022 7:30 A M Medical Record Number: [id number] Patient Account Number: [id number] Date of Birth/Sex: Treating RN: 01/03/1961 (62 y.o. [name] Primary Care Colie Josten: Nira Conn Other Clinician: Referring Roseanna Koplin: Treating Gean Laursen/Extender: Andrey Campanile in Treatment: 25 Vital Signs Time Taken: 07:40 Temperature (F): 98.2 Height (in): 61 Pulse (bpm): 84 Weight (lbs): 277 Respiratory Rate (breaths/min): 20 Body Mass Index (BMI): 52.3 Blood Pressure (mmHg): 141/85 Reference Range: 80 - 120 mg / dl Electronic Signature(s) Signed:  07/19/2022 4:42:28 PM By: [name] RN, BSN Entered By: [name] on 07/19/2022 07:40:32

## 2022-07-26 ENCOUNTER — Telehealth: Payer: Self-pay | Admitting: Nurse Practitioner

## 2022-07-26 ENCOUNTER — Encounter (HOSPITAL_BASED_OUTPATIENT_CLINIC_OR_DEPARTMENT_OTHER): Payer: BC Managed Care – PPO | Admitting: General Surgery

## 2022-07-26 DIAGNOSIS — L97212 Non-pressure chronic ulcer of right calf with fat layer exposed: Secondary | ICD-10-CM | POA: Diagnosis not present

## 2022-07-26 NOTE — Telephone Encounter (Signed)
PT calling for both her and her husband. Both of their orders were done in Feb. Her's at [facility], his with [facility]. Jeanice Lim is helping the husband with [facility]. When she calls no one has these orders.   Please advise PT about derlays @ [phone]

## 2022-07-27 NOTE — Progress Notes (Signed)
[name], [name] ([id number]) [id number] Page 1 of 11 Visit Report for 07/26/2022 Chief Complaint Document Details Patient Name: Date of Service: [place], [name] [place]. 07/26/2022 7:30 A M Medical Record Number: [id number] Patient Account Number: [id number] Date of Birth/Sex: Treating RN: 1960/12/14 (62 y.o. F) Primary Care Provider: Nira Conn Other Clinician: Referring Provider: Treating Provider/Extender: Andrey Campanile in Treatment: 26 Information Obtained from: Patient Chief Complaint RLE ulcer Electronic Signature(s) Signed: 07/26/2022 8:24:16 AM By: [name] MD FACS Entered By: [name] on 07/26/2022 08:24:16 -------------------------------------------------------------------------------- Debridement Details Patient Name: Date of Service: [name], [name] [name] 07/26/2022 7:30 A M Medical Record Number: [id number] Patient Account Number: [id number] Date of Birth/Sex: Treating RN: 02-Aug-1960 (62 y.o. [name] Primary Care Provider: Nira Conn Other Clinician: Referring Provider: Treating Provider/Extender: Andrey Campanile in Treatment: 26 Debridement Performed for Assessment: Wound #2 Right,Posterior Lower Leg Performed By: Physician [name], MD Debridement Type: Debridement Severity of Tissue Pre Debridement: Fat layer exposed Level of Consciousness (Pre-procedure): Awake and Alert Pre-procedure Verification/Time Out Yes - 07:55 Taken: Start Time: 07:57 Pain Control: Lidocaine 4% T opical Solution T Area Debrided (L x W): otal 2.3 (cm) x 1.8 (cm) = 4.14 (cm) Tissue and other material debrided: Non-Viable, Slough, Biofilm, Slough Level: Non-Viable Tissue Debridement Description: Selective/Open Wound Instrument: Curette Bleeding: Minimum Hemostasis Achieved: Pressure Procedural Pain: 0 Post Procedural Pain: 0 Response to Treatment: Procedure was  tolerated well Level of Consciousness (Post- Awake and Alert procedure): Post Debridement Measurements of Total Wound Length: (cm) 2.3 Width: (cm) 1.8 Depth: (cm) 0.1 Volume: (cm) 0.325 Character of Wound/Ulcer Post Debridement: Improved Severity of Tissue Post Debridement: Fat layer exposed Post Procedure Diagnosis Same as Pre-procedure Notes scribed for Dr. Lady Gary by [name], RN Electronic Signature(s) Signed: 07/26/2022 12:37:49 PM By: [name] MD FACS [name] ([id number]) [phone].pdf Page 2 of 11 Signed: 07/26/2022 5:46:14 PM By: [name] RN, BSN Entered By: [name] on 07/26/2022 07:59:47 -------------------------------------------------------------------------------- HPI Details Patient Name: Date of Service: [name], [name] [name] 07/26/2022 7:30 A M Medical Record Number: [id number] Patient Account Number: [id number] Date of Birth/Sex: Treating RN: 03/26/1961 (62 y.o. F) Primary Care Provider: Nira Conn Other Clinician: Referring Provider: Treating Provider/Extender: Andrey Campanile in Treatment: 26 History of Present Illness HPI Description: 02/16/2020 upon evaluation today patient actually appears to be doing poorly in regard to her left medial lower extremity ulcer. This is actually an area that she tells me she has had intermittent issues with over the years although has been closed for some time she typically uses compression right now she has juxta lite compression wraps. With that being said she tells me that this nonetheless open several weeks/months ago and has been given her trouble since. She does have a history of chronic venous insufficiency she is seeing specialist for this in the past she has had an ablation as well as sclerotherapy. With that being said she also has hypertension chronically which is managed by her primary care provider. In general she seems to  be worsening overall with regard to the wound and states that she finally realized that she needed to come in and have somebody look at this and not continue to try to manage this on her own. No fevers, chills, nausea, vomiting, or diarrhea. 02/23/2020 on evaluation today patient appears to be doing well with regard to her wound. This is showing some signs of improvement which is great news still  were not quite at the point where I would like to be as far as the overall appearance of the wound is concerned but I do believe this is better than last week. I do believe the Iodoflex is helping as well. 03/08/2020 upon evaluation today patient appears to be doing well with regard to her wound. She has been tolerating the dressing changes without complication. Fortunately I feel like she has made great progress with the Iodoflex but I feel like it may be the point rest to switch to something else possibly a collagen- based dressing at this time. 03/15/2020 upon evaluation today patient appears to be doing excellent in regard to her leg ulcer. She has been tolerating the dressing changes without complication. Fortunately there is no signs of active infection. Overall she is measuring a little bit smaller today which is great news. 03/22/2020 upon evaluation today patient appears to be doing well with regard to her wound. She has been tolerating the dressing changes without complication. Fortunately there is no signs of active infection at this time. 03/28/2020; patient I do not usually see however she has a wound on the left anterior lower leg secondary to chronic venous insufficiency we have been using silver collagen under compression. She arrives in clinic with a nonviable surface requiring debridement 04/12/2020 upon evaluation today patient appears to be doing well all things considered with regard to her leg ulcer. She is tolerating the dressing changes without complication there is minimal dry skin  around the edges of the wound that may be trapping and stopping some of the events of the new skin I am can work on that today. Otherwise the surface of the wound appears to be doing excellent. 04/19/2020 upon evaluation today patient appears to be doing well with regard to her leg ulcer. She has been tolerating dressing changes without complication. Fortunately there is no signs of active infection at this time. No fever chills noted overall very pleased with how things seem to be progressing. 04/26/2020 on evaluation today patient appears to be doing well with regard to her wound currently. Is showing signs of excellent improvement overall is filling in nicely and there does not appear to be any signs of infection. No fevers, chills, nausea, vomiting, or diarrhea. 05/03/2020 upon evaluation today patient appears to be doing well with regard to her wound on the leg. This overall showing signs of good improvement which is great she has some good epithelial growth and overall I think that things are moving in the correct direction. We likewise going to continue with the wound care measures as before since she seems to making such good improvement. 2/3; venous wound on the left medial leg. This is contracting. We are using Prisma and 3 layer compression. She has a stocking and waiting in the eventuality this heals. She is already using it on the right 05/17/2020 upon evaluation today patient appears to be doing well with regard to her leg ulcer. She has been tolerating the dressing changes without complication. Fortunately there is no signs of active infection which is great news and overall very pleased with where things stand today. No fevers, chills, nausea, vomiting, or diarrhea. 05/24/2020 upon evaluation today patient appears to be doing well with regard to her wound. Overall I feel like she is making excellent progress. There does not appear to be any signs of active infection which is great  news. 05/31/2020 upon evaluation today patient appears to be doing well with regard to her wound.  There does not appear to be any signs of active infection which is great news overall I am extremely pleased with where things stand today. READMISSION 01/23/2022 She returns to clinic today with a new wound on her right posterior calf. She says that she was cleaning out an old shed near the middle of August this year and then noticed what seemed to be a bug bite on her right posterior calf. It was itchy, red, and raised. By the end of September, an ulcer had developed. She has been applying various topical creams such as hydrocortisone and others to the site. When it was not improving, she made an appointment in the wound care center. ABI in clinic today was 0.97. On her right posterior calf, there is a circular wound with necrotic fat and black eschar present. There is no purulent drainage or malodor. The periwound skin is in good condition with just a little induration that appears to be secondary to inflammation. 01/31/2022: The wound measures a little bit larger today, but overall is quite a bit cleaner. There is some undermining from 9:00 to 3:00. She is having some periwound itching and says that her wrap slid. 02/08/2022: Despite using an Unna boot first layer at the top of her wrap, it slid again and it looks like there is been some bruising at the wound site. The wound is about the same size in terms of dimensions. There is a fair amount of slough and nonviable tissue still present. Edema control is better than last week. 02/15/2022: The wound dimensions are about the same. There is less nonviable tissue present. The periwound erythema has improved. 02/22/2022: The wound has deteriorated over the past week. It is larger and the periwound is more edematous, erythematous, and indurated. It looks as though there has been more tissue breakdown with undermining present. She is having more pain. There  is a foul odor coming from the wound. [name], [name] ([id number]) [id number] Page 3 of 11 02/26/2022: The wound looks quite a bit better today and the odor is gone. I still do not have her culture data back, but she seems to be responding well to the Augmentin. 03/06/2022: Her wound continues to improve. There is still some slough accumulation, but the periwound skin is less inflamed. Her culture returned with a polymicrobial population including Pseudomonas and so levofloxacin was also prescribed. She did not understand why a second antibiotic was being added so she has not yet initiated this. 03/14/2022: The wound is about the same, to perhaps a little bit larger. There is a fair amount of slough accumulation on the surface, as well as some hypertrophic granulation tissue. She is still taking levofloxacin, but has completed taking Augmentin. She has her Jodie Echevaria compound with her today. 12/14; the patient has a significant circular wound on the posterior right calf. She is using Keystone and silver alginate under 3 layer compression. Her ABIs were within normal limits at 0.97. This may have been traumatic or an insect bite at the start I reviewed these records. 04/04/2022: Since I last saw the wound, it has contracted considerably. There is a fairly thick layer of slough on the surface, as it has not been debrided since the last time I did it. Edema control is excellent and the periwound skin is in much better condition. 04/12/2022: No significant change in the wound dimensions. It is filling with granulation tissue. Still with slough accumulation on the surface. 04/19/2022: The wound is smaller this week. There is some  slough accumulation on the surface. 04/26/2022: The wound is smaller again this week and significantly cleaner. The wound surface is a little bit drier than ideal. 05/03/2022: The wound measurements were about the same, but visually it appears smaller. There  is still some undermining at the top of the wound. Moisture balance is better this week. 05/10/2022: The wound measured smaller today. There is still a fair amount of undermining present. Slough has built up on the surface. We are still awaiting snap VAC approval. 05/17/2022: The wound is a little bit smaller today. There is less slough on the surface, but the granulation tissue still is not very robust. She has been approved for snap VAC but will have a 20% coinsurance and it is not clear what that amount would end up being for her. 05/27/2022: The surface of the wound has deteriorated and it is gray and fibrotic. No significant odor, but the drainage on her dressing was a little bit purulent. 05/31/2022: The wound looks quite a bit better today. It is still a little fibrotic but no longer has purulent-looking drainage. The color is better. She spoke with her insurance company and is interested in trying the snap VAC, now that she is aware of the cost to her. 06/07/2022: After 1 week in the snap VAC, there has been substantial improvement to her wound. The undermined portion is closing in. The surface has a healthier color and appearance. There is very minimal slough accumulation. 06/14/2022: The more shallow part of the undermined portion of her wound has closed. There is still some undermining from about 1-2 o'clock. The surface continues to improve. Minimal slough accumulation. 06/28/2022: The wound is shallower this week and the undermining has essentially closed and completely. The surface tissue is improving. 07/05/2022: The depth is almost immeasurable and the surface is nearly flush with the surrounding skin. The undermining has been eliminated. There is light slough on the wound surface. 07/12/2022: There is a little bit of slough on the wound surface. The depth is about the same as last week. 07/19/2022: The wound is essentially flush with the surrounding skin surface. There is slight slough present. No  real change in the AP or transverse dimensions. 07/26/2022: The wound measured a little bit smaller today. It is flush with the surrounding skin surface and there is good granulation tissue present. Minimal slough and biofilm accumulation. Electronic Signature(s) Signed: 07/26/2022 8:24:46 AM By: [name] MD FACS Entered By: [name] on 07/26/2022 08:24:46 -------------------------------------------------------------------------------- Physical Exam Details Patient Name: Date of Service: [name], [name] 07/26/2022 7:30 A M Medical Record Number: [id number] Patient Account Number: [id number] Date of Birth/Sex: Treating RN: 1960/04/14 (62 y.o. F) Primary Care Provider: Nira Conn Other Clinician: Referring Provider: Treating Provider/Extender: Andrey Campanile in Treatment: 26 Constitutional Slightly hypertensive. . . . no acute distress. Respiratory Normal work of breathing on room air. Notes 07/26/2022: The wound measured a little bit smaller today. It is flush with the surrounding skin surface and there is good granulation tissue present. Minimal slough and biofilm accumulation. Electronic Signature(s) Signed: 07/26/2022 8:26:10 AM By: [name] MD FACS [name] ([id number]) [id number] Page 4 of 11 Entered By: [name] on 07/26/2022 08:26:10 -------------------------------------------------------------------------------- Physician Orders Details Patient Name: Date of Service: [name], [name] [name] 07/26/2022 7:30 A M Medical Record Number: [id number] Patient Account Number: [id number] Date of Birth/Sex: Treating RN: 09/07/1960 (62 y.o. [name] Primary Care Provider: Nira Conn Other Clinician: Referring Provider: Treating  Provider/Extender: Andrey Campanile in Treatment: 26 Verbal / Phone Orders: No Diagnosis Coding ICD-10 Coding Code  Description [phone] Non-pressure chronic ulcer of right calf with fat layer exposed E66.01 Morbid (severe) obesity due to excess calories I10 Essential (primary) hypertension R60.0 Localized edema Follow-up Appointments ppointment in 1 week. - Dr. Lady Gary RM 1 Return A Friday 4/26@ 2:45 pm Anesthetic (In clinic) Topical Lidocaine 4% applied to wound bed Bathing/ Shower/ Hygiene May shower with protection but do not get wound dressing(s) wet. Protect dressing(s) with water repellant cover (for example, large plastic bag) or a cast cover and may then take shower. Edema Control - Lymphedema / SCD / Other Bilateral Lower Extremities Elevate legs to the level of the heart or above for 30 minutes daily and/or when sitting for 3-4 times a day throughout the day. Avoid standing for long periods of time. Exercise regularly Moisturize legs daily. Compression stocking or Garment 20-30 mm/Hg pressure to: - left leg daily. Apply first thing in the morning, remove at night. Wound Treatment Wound #2 - Lower Leg Wound Laterality: Right, Posterior Cleanser: Soap and Water 1 x Per Week/30 Days Discharge Instructions: May shower and wash wound with dial antibacterial soap and water prior to dressing change. Cleanser: Wound Cleanser 1 x Per Week/30 Days Discharge Instructions: Cleanse the wound with wound cleanser prior to applying a clean dressing using gauze sponges, not tissue or cotton balls. Peri-Wound Care: Sween Lotion (Moisturizing lotion) 1 x Per Week/30 Days Discharge Instructions: Apply moisturizing lotion as directed Prim Dressing: Hydrofera Blue Classic Foam, 2x2 in 1 x Per Week/30 Days ary Discharge Instructions: Moisten with saline prior to applying to wound bed Secondary Dressing: Woven Gauze Sponge, Non-Sterile 4x4 in 1 x Per Week/30 Days Discharge Instructions: Apply over primary dressing as directed. Compression Wrap: Urgo K2 Lite, two layer compression system, regular 1 x Per Week/30  Days Discharge Instructions: Apply Urgo K2 Lite as directed (alternative to 3 layer compression). Electronic Signature(s) Signed: 07/26/2022 12:37:49 PM By: [name] MD FACS Entered By: [name] on 07/26/2022 08:26:31 -------------------------------------------------------------------------------- Problem List Details Patient Name: Date of Service: [name], [name] [name] 07/26/2022 7:30 A M Medical Record Number: [id number] Patient Account Number: [id number] [name], [name] ([id number]) [id number] Page 5 of 11 Date of Birth/Sex: Treating RN: 05-23-1960 (62 y.o. [name] Primary Care Provider: Other Clinician: Nira Conn Referring Provider: Treating Provider/Extender: Andrey Campanile in Treatment: 26 Active Problems ICD-10 Encounter Code Description Active Date MDM Diagnosis L97.212 Non-pressure chronic ulcer of right calf with fat layer exposed 01/23/2022 No Yes E66.01 Morbid (severe) obesity due to excess calories 01/23/2022 No Yes I10 Essential (primary) hypertension 01/23/2022 No Yes R60.0 Localized edema 01/23/2022 No Yes Inactive Problems Resolved Problems Electronic Signature(s) Signed: 07/26/2022 8:24:03 AM By: [name] MD FACS Entered By: [name] on 07/26/2022 08:24:03 -------------------------------------------------------------------------------- Progress Note Details Patient Name: Date of Service: [name], [name] 07/26/2022 7:30 A M Medical Record Number: [id number] Patient Account Number: [id number] Date of Birth/Sex: Treating RN: 05/20/1960 (62 y.o. F) Primary Care Provider: Nira Conn Other Clinician: Referring Provider: Treating Provider/Extender: Andrey Campanile in Treatment: 26 Subjective Chief Complaint Information obtained from Patient RLE ulcer History of Present Illness (HPI) 02/16/2020 upon evaluation today patient  actually appears to be doing poorly in regard to her left medial lower extremity ulcer. This is actually an area that she tells me she has had intermittent issues with over the years although has been  closed for some time she typically uses compression right now she has juxta lite compression wraps. With that being said she tells me that this nonetheless open several weeks/months ago and has been given her trouble since. She does have a history of chronic venous insufficiency she is seeing specialist for this in the past she has had an ablation as well as sclerotherapy. With that being said she also has hypertension chronically which is managed by her primary care provider. In general she seems to be worsening overall with regard to the wound and states that she finally realized that she needed to come in and have somebody look at this and not continue to try to manage this on her own. No fevers, chills, nausea, vomiting, or diarrhea. 02/23/2020 on evaluation today patient appears to be doing well with regard to her wound. This is showing some signs of improvement which is great news still were not quite at the point where I would like to be as far as the overall appearance of the wound is concerned but I do believe this is better than last week. I do believe the Iodoflex is helping as well. 03/08/2020 upon evaluation today patient appears to be doing well with regard to her wound. She has been tolerating the dressing changes without complication. Fortunately I feel like she has made great progress with the Iodoflex but I feel like it may be the point rest to switch to something else possibly a collagen- based dressing at this time. 03/15/2020 upon evaluation today patient appears to be doing excellent in regard to her leg ulcer. She has been tolerating the dressing changes without complication. Fortunately there is no signs of active infection. Overall she is measuring a little bit smaller today which  is great news. 03/22/2020 upon evaluation today patient appears to be doing well with regard to her wound. She has been tolerating the dressing changes without complication. Fortunately there is no signs of active infection at this time. 03/28/2020; patient I do not usually see however she has a wound on the left anterior lower leg secondary to chronic venous insufficiency we have been using silver collagen under compression. She arrives in clinic with a nonviable surface requiring debridement VRINDA, HECKSTALL ([id number]) [phone] Page 6 of 11 04/12/2020 upon evaluation today patient appears to be doing well all things considered with regard to her leg ulcer. She is tolerating the dressing changes without complication there is minimal dry skin around the edges of the wound that may be trapping and stopping some of the events of the new skin I am can work on that today. Otherwise the surface of the wound appears to be doing excellent. 04/19/2020 upon evaluation today patient appears to be doing well with regard to her leg ulcer. She has been tolerating dressing changes without complication. Fortunately there is no signs of active infection at this time. No fever chills noted overall very pleased with how things seem to be progressing. 04/26/2020 on evaluation today patient appears to be doing well with regard to her wound currently. Is showing signs of excellent improvement overall is filling in nicely and there does not appear to be any signs of infection. No fevers, chills, nausea, vomiting, or diarrhea. 05/03/2020 upon evaluation today patient appears to be doing well with regard to her wound on the leg. This overall showing signs of good improvement which is great she has some good epithelial growth and overall I think that things are moving in the correct direction.  We likewise going to continue with the wound care measures as before since she seems to making such good  improvement. 2/3; venous wound on the left medial leg. This is contracting. We are using Prisma and 3 layer compression. She has a stocking and waiting in the eventuality this heals. She is already using it on the right 05/17/2020 upon evaluation today patient appears to be doing well with regard to her leg ulcer. She has been tolerating the dressing changes without complication. Fortunately there is no signs of active infection which is great news and overall very pleased with where things stand today. No fevers, chills, nausea, vomiting, or diarrhea. 05/24/2020 upon evaluation today patient appears to be doing well with regard to her wound. Overall I feel like she is making excellent progress. There does not appear to be any signs of active infection which is great news. 05/31/2020 upon evaluation today patient appears to be doing well with regard to her wound. There does not appear to be any signs of active infection which is great news overall I am extremely pleased with where things stand today. READMISSION 01/23/2022 She returns to clinic today with a new wound on her right posterior calf. She says that she was cleaning out an old shed near the middle of August this year and then noticed what seemed to be a bug bite on her right posterior calf. It was itchy, red, and raised. By the end of September, an ulcer had developed. She has been applying various topical creams such as hydrocortisone and others to the site. When it was not improving, she made an appointment in the wound care center. ABI in clinic today was 0.97. On her right posterior calf, there is a circular wound with necrotic fat and black eschar present. There is no purulent drainage or malodor. The periwound skin is in good condition with just a little induration that appears to be secondary to inflammation. 01/31/2022: The wound measures a little bit larger today, but overall is quite a bit cleaner. There is some undermining from 9:00  to 3:00. She is having some periwound itching and says that her wrap slid. 02/08/2022: Despite using an Unna boot first layer at the top of her wrap, it slid again and it looks like there is been some bruising at the wound site. The wound is about the same size in terms of dimensions. There is a fair amount of slough and nonviable tissue still present. Edema control is better than last week. 02/15/2022: The wound dimensions are about the same. There is less nonviable tissue present. The periwound erythema has improved. 02/22/2022: The wound has deteriorated over the past week. It is larger and the periwound is more edematous, erythematous, and indurated. It looks as though there has been more tissue breakdown with undermining present. She is having more pain. There is a foul odor coming from the wound. 02/26/2022: The wound looks quite a bit better today and the odor is gone. I still do not have her culture data back, but she seems to be responding well to the Augmentin. 03/06/2022: Her wound continues to improve. There is still some slough accumulation, but the periwound skin is less inflamed. Her culture returned with a polymicrobial population including Pseudomonas and so levofloxacin was also prescribed. She did not understand why a second antibiotic was being added so she has not yet initiated this. 03/14/2022: The wound is about the same, to perhaps a little bit larger. There is a fair amount of  slough accumulation on the surface, as well as some hypertrophic granulation tissue. She is still taking levofloxacin, but has completed taking Augmentin. She has her Jodie Echevaria compound with her today. 12/14; the patient has a significant circular wound on the posterior right calf. She is using Keystone and silver alginate under 3 layer compression. Her ABIs were within normal limits at 0.97. This may have been traumatic or an insect bite at the start I reviewed these records. 04/04/2022: Since I last saw  the wound, it has contracted considerably. There is a fairly thick layer of slough on the surface, as it has not been debrided since the last time I did it. Edema control is excellent and the periwound skin is in much better condition. 04/12/2022: No significant change in the wound dimensions. It is filling with granulation tissue. Still with slough accumulation on the surface. 04/19/2022: The wound is smaller this week. There is some slough accumulation on the surface. 04/26/2022: The wound is smaller again this week and significantly cleaner. The wound surface is a little bit drier than ideal. 05/03/2022: The wound measurements were about the same, but visually it appears smaller. There is still some undermining at the top of the wound. Moisture balance is better this week. 05/10/2022: The wound measured smaller today. There is still a fair amount of undermining present. Slough has built up on the surface. We are still awaiting snap VAC approval. 05/17/2022: The wound is a little bit smaller today. There is less slough on the surface, but the granulation tissue still is not very robust. She has been approved for snap VAC but will have a 20% coinsurance and it is not clear what that amount would end up being for her. 05/27/2022: The surface of the wound has deteriorated and it is gray and fibrotic. No significant odor, but the drainage on her dressing was a little bit purulent. 05/31/2022: The wound looks quite a bit better today. It is still a little fibrotic but no longer has purulent-looking drainage. The color is better. She spoke with her insurance company and is interested in trying the snap VAC, now that she is aware of the cost to her. 06/07/2022: After 1 week in the snap VAC, there has been substantial improvement to her wound. The undermined portion is closing in. The surface has a healthier color and appearance. There is very minimal slough accumulation. 06/14/2022: The more shallow part of the  undermined portion of her wound has closed. There is still some undermining from about 1-2 o'clock. The surface continues to improve. Minimal slough accumulation. 06/28/2022: The wound is shallower this week and the undermining has essentially closed and completely. The surface tissue is improving. 07/05/2022: The depth is almost immeasurable and the surface is nearly flush with the surrounding skin. The undermining has been eliminated. There is light slough on the wound surface. [name], [name] ([id number]) [id number] Page 7 of 11 07/12/2022: There is a little bit of slough on the wound surface. The depth is about the same as last week. 07/19/2022: The wound is essentially flush with the surrounding skin surface. There is slight slough present. No real change in the AP or transverse dimensions. 07/26/2022: The wound measured a little bit smaller today. It is flush with the surrounding skin surface and there is good granulation tissue present. Minimal slough and biofilm accumulation. Patient History Information obtained from Patient. Family History Cancer - Mother, Diabetes - Father, Hypertension - Mother, Kidney Disease - Mother, Lung Disease - Father, Thyroid Problems -  Paternal Grandparents, No family history of Heart Disease, Seizures, Stroke, Tuberculosis. Social History Never smoker, Marital Status - Married, Alcohol Use - Never, Drug Use - No History, Caffeine Use - Daily - coffee. Medical History Eyes Denies history of Cataracts, Glaucoma, Optic Neuritis Ear/Nose/Mouth/Throat Denies history of Chronic sinus problems/congestion, Middle ear problems Hematologic/Lymphatic Patient has history of Anemia - iron Denies history of Hemophilia, Human Immunodeficiency Virus, Lymphedema, Sickle Cell Disease Respiratory Patient has history of Sleep Apnea - CPAP Denies history of Aspiration, Asthma, Chronic Obstructive Pulmonary Disease (COPD), Pneumothorax,  Tuberculosis Cardiovascular Patient has history of Hypertension, Peripheral Venous Disease Denies history of Angina, Arrhythmia, Congestive Heart Failure, Coronary Artery Disease, Deep Vein Thrombosis, Hypotension, Myocardial Infarction, Peripheral Arterial Disease, Phlebitis, Vasculitis Gastrointestinal Denies history of Cirrhosis , Colitis, Crohnoos, Hepatitis A, Hepatitis B, Hepatitis C Endocrine Denies history of Type I Diabetes, Type II Diabetes Genitourinary Denies history of End Stage Renal Disease Immunological Denies history of Lupus Erythematosus, Raynaudoos, Scleroderma Integumentary (Skin) Denies history of History of Burn Musculoskeletal Denies history of Gout, Rheumatoid Arthritis, Osteoarthritis, Osteomyelitis Neurologic Denies history of Dementia, Neuropathy, Quadriplegia, Paraplegia, Seizure Disorder Oncologic Denies history of Received Chemotherapy, Received Radiation Psychiatric Denies history of Anorexia/bulimia, Confinement Anxiety Hospitalization/Surgery History - cholecystectomy 1980s. - nephrolithasis 1980s. - plate and rod in right elbow surgery 2010. Medical A Surgical History Notes nd Genitourinary years ago kidney stones Oncologic skin Ca removed from back years ago Objective Constitutional Slightly hypertensive. no acute distress. Vitals Time Taken: 7:42 AM, Height: 61 in, Weight: 277 lbs, BMI: 52.3, Temperature: 98 F, Pulse: 87 bpm, Respiratory Rate: 20 breaths/min, Blood Pressure: 140/88 mmHg. Respiratory Normal work of breathing on room air. General Notes: 07/26/2022: The wound measured a little bit smaller today. It is flush with the surrounding skin surface and there is good granulation tissue present. Minimal slough and biofilm accumulation. Integumentary (Hair, Skin) Wound #2 status is Open. Original cause of wound was Insect Bite. The date acquired was: 11/21/2021. The wound has been in treatment 26 weeks. The wound is located on the  Right,Posterior Lower Leg. The wound measures 2.3cm length x 1.8cm width x 0.1cm depth; 3.252cm^2 area and 0.325cm^3 volume. There is Fat Layer (Subcutaneous Tissue) exposed. There is no tunneling or undermining noted. There is a medium amount of serosanguineous drainage noted. The [name], [name] ([id number]) [id number] Page 8 of 11 wound margin is distinct with the outline attached to the wound base. There is medium (34-66%) red, pink granulation within the wound bed. There is a medium (34-66%) amount of necrotic tissue within the wound bed including Adherent Slough. The periwound skin appearance had no abnormalities noted for texture. The periwound skin appearance had no abnormalities noted for moisture. The periwound skin appearance exhibited: Hemosiderin Staining. The periwound skin appearance did not exhibit: Atrophie Blanche, Ecchymosis, Rubor, Erythema. Periwound temperature was noted as No Abnormality. Assessment Active Problems ICD-10 Non-pressure chronic ulcer of right calf with fat layer exposed Morbid (severe) obesity due to excess calories Essential (primary) hypertension Localized edema Procedures Wound #2 Pre-procedure diagnosis of Wound #2 is a Venous Leg Ulcer located on the Right,Posterior Lower Leg .Severity of Tissue Pre Debridement is: Fat layer exposed. There was a Selective/Open Wound Non-Viable Tissue Debridement with a total area of 4.14 sq cm performed by [name], MD. With the following instrument(s): Curette to remove Non-Viable tissue/material. Material removed includes Slough and Biofilm and after achieving pain control using Lidocaine 4% T opical Solution. No specimens were taken. A time out was conducted at  07:55, prior to the start of the procedure. A Minimum amount of bleeding was controlled with Pressure. The procedure was tolerated well with a pain level of 0 throughout and a pain level of 0 following the procedure.  Post Debridement Measurements: 2.3cm length x 1.8cm width x 0.1cm depth; 0.325cm^3 volume. Character of Wound/Ulcer Post Debridement is improved. Severity of Tissue Post Debridement is: Fat layer exposed. Post procedure Diagnosis Wound #2: Same as Pre-Procedure General Notes: scribed for Dr. Lady Gary by [name], RN. Pre-procedure diagnosis of Wound #2 is a Venous Leg Ulcer located on the Right,Posterior Lower Leg . There was a Double Layer Compression Therapy Procedure by [name], RN. Post procedure Diagnosis Wound #2: Same as Pre-Procedure Plan Follow-up Appointments: Return Appointment in 1 week. - Dr. Lady Gary RM 1 Friday 4/26@ 2:45 pm Anesthetic: (In clinic) Topical Lidocaine 4% applied to wound bed Bathing/ Shower/ Hygiene: May shower with protection but do not get wound dressing(s) wet. Protect dressing(s) with water repellant cover (for example, large plastic bag) or a cast cover and may then take shower. Edema Control - Lymphedema / SCD / Other: Elevate legs to the level of the heart or above for 30 minutes daily and/or when sitting for 3-4 times a day throughout the day. Avoid standing for long periods of time. Exercise regularly Moisturize legs daily. Compression stocking or Garment 20-30 mm/Hg pressure to: - left leg daily. Apply first thing in the morning, remove at night. WOUND #2: - Lower Leg Wound Laterality: Right, Posterior Cleanser: Soap and Water 1 x Per Week/30 Days Discharge Instructions: May shower and wash wound with dial antibacterial soap and water prior to dressing change. Cleanser: Wound Cleanser 1 x Per Week/30 Days Discharge Instructions: Cleanse the wound with wound cleanser prior to applying a clean dressing using gauze sponges, not tissue or cotton balls. Peri-Wound Care: Sween Lotion (Moisturizing lotion) 1 x Per Week/30 Days Discharge Instructions: Apply moisturizing lotion as directed Prim Dressing: Hydrofera Blue Classic Foam, 2x2 in 1 x Per  Week/30 Days ary Discharge Instructions: Moisten with saline prior to applying to wound bed Secondary Dressing: Woven Gauze Sponge, Non-Sterile 4x4 in 1 x Per Week/30 Days Discharge Instructions: Apply over primary dressing as directed. Com pression Wrap: Urgo K2 Lite, two layer compression system, regular 1 x Per Week/30 Days Discharge Instructions: Apply Urgo K2 Lite as directed (alternative to 3 layer compression). 07/26/2022: The wound measured a little bit smaller today. It is flush with the surrounding skin surface and there is good granulation tissue present. Minimal slough and biofilm accumulation. Edema control is good. I used a curette to debride slough and biofilm from the wound. We will continue Hydrofera Blue with 3 layer compression or equivalent. Follow-up in 1 week. Electronic Signature(s) Signed: 07/26/2022 8:27:12 AM By: [name] MD FACS [place],Signed: 07/26/2022 8:27:12 AM By: [name] MD FACS [name] ([id number]) [id number] Page 9 of 11 Entered By: [name] on 07/26/2022 08:27:12 -------------------------------------------------------------------------------- HxROS Details Patient Name: Date of Service: [name], [name] [name] 07/26/2022 7:30 A M Medical Record Number: [id number] Patient Account Number: [id number] Date of Birth/Sex: Treating RN: [birth year] (62 y.o. F) Primary Care Provider: Nira Conn Other Clinician: Referring Provider: Treating Provider/Extender: Andrey Campanile in Treatment: 26 Information Obtained From Patient Eyes Medical History: Negative for: Cataracts; Glaucoma; Optic Neuritis Ear/Nose/Mouth/Throat Medical History: Negative for: Chronic sinus problems/congestion; Middle ear problems Hematologic/Lymphatic Medical History: Positive for: Anemia - iron Negative for: Hemophilia; Human Immunodeficiency Virus; Lymphedema; Sickle Cell Disease Respiratory  Medical  History: Positive for: Sleep Apnea - CPAP Negative for: Aspiration; Asthma; Chronic Obstructive Pulmonary Disease (COPD); Pneumothorax; Tuberculosis Cardiovascular Medical History: Positive for: Hypertension; Peripheral Venous Disease Negative for: Angina; Arrhythmia; Congestive Heart Failure; Coronary Artery Disease; Deep Vein Thrombosis; Hypotension; Myocardial Infarction; Peripheral Arterial Disease; Phlebitis; Vasculitis Gastrointestinal Medical History: Negative for: Cirrhosis ; Colitis; Crohns; Hepatitis A; Hepatitis B; Hepatitis C Endocrine Medical History: Negative for: Type I Diabetes; Type II Diabetes Genitourinary Medical History: Negative for: End Stage Renal Disease Past Medical History Notes: years ago kidney stones Immunological Medical History: Negative for: Lupus Erythematosus; Raynauds; Scleroderma Integumentary (Skin) Medical History: Negative for: History of Burn Musculoskeletal Medical History: Negative for: Gout; Rheumatoid Arthritis; Osteoarthritis; Osteomyelitis NALEAH, KOFOED ([id number]) [id number] Page 10 of 11 Neurologic Medical History: Negative for: Dementia; Neuropathy; Quadriplegia; Paraplegia; Seizure Disorder Oncologic Medical History: Negative for: Received Chemotherapy; Received Radiation Past Medical History Notes: skin Ca removed from back years ago Psychiatric Medical History: Negative for: Anorexia/bulimia; Confinement Anxiety Immunizations Pneumococcal Vaccine: Received Pneumococcal Vaccination: No Implantable Devices None Hospitalization / Surgery History Type of Hospitalization/Surgery cholecystectomy 1980s nephrolithasis 1980s plate and rod in right elbow surgery 2010 Family and Social History Cancer: Yes - Mother; Diabetes: Yes - Father; Heart Disease: No; Hypertension: Yes - Mother; Kidney Disease: Yes - Mother; Lung Disease: Yes - Father; Seizures: No; Stroke: No; Thyroid Problems: Yes -  Paternal Grandparents; Tuberculosis: No; Never smoker; Marital Status - Married; Alcohol Use: Never; Drug Use: No History; Caffeine Use: Daily - coffee; Financial Concerns: No; Food, Clothing or Shelter Needs: No; Support System Lacking: No; Transportation Concerns: No Electronic Signature(s) Signed: 07/26/2022 12:37:49 PM By: [name] MD FACS Entered By: [name] on 07/26/2022 08:25:37 -------------------------------------------------------------------------------- SuperBill Details Patient Name: Date of Service: [name], [name] 07/26/2022 Medical Record Number: [id number] Patient Account Number: [id number] Date of Birth/Sex: Treating RN: Aug 25, 1960 (62 y.o. F) Primary Care Provider: Nira Conn Other Clinician: Referring Provider: Treating Provider/Extender: Andrey Campanile in Treatment: 26 Diagnosis Coding ICD-10 Codes Code Description [phone] Non-pressure chronic ulcer of right calf with fat layer exposed E66.01 Morbid (severe) obesity due to excess calories I10 Essential (primary) hypertension R60.0 Localized edema Facility Procedures : CPT4 Code: [id number] Description: 97597 - DEBRIDE WOUND 1ST 20 SQ CM OR < ICD-10 Diagnosis Description L97.212 Non-pressure chronic ulcer of right calf with fat layer exposed Modifier: Quantity: 1 Physician Procedures : CPT4 Code Description Modifier [id number] - WC PHYS LEVEL 3 - EST PT 25 ESTELL, PUCCINI ([id number]) [id number] ICD-10 Diagnosis Description L97.212 Non-pressure chronic ulcer of right calf with fat layer exposed E66.01  Morbid (severe) obesity due to excess calories R60.0 Localized edema I10 Essential (primary) hypertension Quantity: 1 27.pdf Page 11 of 11 : [id number] 97597 - WC PHYS DEBR WO ANESTH 20 SQ CM 1 ICD-10 Diagnosis Description L97.212 Non-pressure chronic ulcer of right calf with fat layer exposed Quantity: Electronic Signature(s) Signed:  07/26/2022 8:27:29 AM By: [name] MD FACS Entered By: [name] on 07/26/2022 08:27:29

## 2022-07-27 NOTE — Progress Notes (Signed)
[name], [name] ([id number]) [id number] Page 1 of 8 Visit Report for 07/26/2022 Arrival Information Details Patient Name: Date of Service: [place], [name] [name] 07/26/2022 7:30 A M Medical Record Number: [id number] Patient Account Number: [id number] Date of Birth/Sex: Treating RN: [birth year] (62 y.o. [name] Primary Care [name]: [name] Other Clinician: Referring [name]: Treating [name]/Extender: [name] in Treatment: 26 Visit Information History Since Last Visit Added or deleted any medications: No Patient Arrived: Ambulatory Any new allergies or adverse reactions: No Arrival Time: 07:42 Had a fall or experienced change in No Accompanied By: self activities of daily living that may affect Transfer Assistance: None risk of falls: Patient Identification Verified: Yes Signs or symptoms of abuse/neglect since last visito No Secondary Verification Process Completed: Yes Hospitalized since last visit: No Patient Requires Transmission-Based Precautions: No Implantable device outside of the clinic excluding No Patient Has Alerts: No cellular tissue based products placed in the center since last visit: Has Dressing in Place as Prescribed: Yes Has Compression in Place as Prescribed: Yes Pain Present Now: No Electronic Signature(s) Signed: 07/26/2022 5:46:14 PM By: [name] RN, BSN Entered By: [name] on 07/26/2022 07:42:41 -------------------------------------------------------------------------------- Compression Therapy Details Patient Name: Date of Service: [name], [name] [name] 07/26/2022 7:30 A M Medical Record Number: [id number] Patient Account Number: [id number] Date of Birth/Sex: Treating RN: 05-28-1960 (63 y.o. [name] Primary Care [place]: [name] Other Clinician: Referring Arnika Larzelere: Treating [name]/Extender: [name] in  Treatment: 26 Compression Therapy Performed for Wound Assessment: Wound #2 Right,Posterior Lower Leg Performed By: Clinician [name], RN Compression Type: Double Layer Post Procedure Diagnosis Same as Pre-procedure Electronic Signature(s) Signed: 07/26/2022 5:46:14 PM By: [name] RN, BSN Entered By: [name] on 07/26/2022 07:58:16 -------------------------------------------------------------------------------- Encounter Discharge Information Details Patient Name: Date of Service: [name] 07/26/2022 7:30 A M Medical Record Number: [id number] Patient Account Number: [id number] Date of Birth/Sex: Treating RN: 1960/07/28 (62 y.o. [name] Primary Care [place]: [name] Other Clinician: Referring Zahlia Deshazer: Treating Cornelius Schuitema/Extender: [name] in Treatment: 26 Encounter Discharge Information Items Post Procedure Vitals Discharge Condition: Stable Temperature (F): 98 Ambulatory Status: Ambulatory Pulse (bpm): 87 Discharge Destination: Home Respiratory Rate (breaths/min): 18 Transportation: Private Auto Blood Pressure (mmHg): 140/88 [name] ([id number]) [id number] Page 2 of 8 Accompanied By: self Schedule Follow-up Appointment: Yes Clinical Summary of Care: Patient Declined Electronic Signature(s) Signed: 07/26/2022 5:46:14 PM By: [name] RN, BSN Entered By: [name] on 07/26/2022 08:13:57 -------------------------------------------------------------------------------- Lower Extremity Assessment Details Patient Name: Date of Service: [name] 07/26/2022 7:30 A M Medical Record Number: [id number] Patient Account Number: [id number] Date of Birth/Sex: Treating RN: 1960/06/10 (63 y.o. [name] Primary Care Hakan Nudelman: [name] Other Clinician: Referring Curstin Schmale: Treating Michol Emory/Extender: [name] in Treatment: 26 Edema Assessment Assessed: [Left: No] [Right: No] Edema: [Left: Ye] [Right: s] Calf Left: Right: Point of Measurement: From Medial Instep 38.5 cm Ankle Left: Right: Point of Measurement: From Medial Instep 24 cm Vascular Assessment Pulses: Dorsalis Pedis Palpable: [Right:Yes] Electronic Signature(s) Signed: 07/26/2022 5:46:14 PM By: [name] RN, BSN Entered By: [name] on 07/26/2022 07:46:01 -------------------------------------------------------------------------------- Multi Wound Chart Details Patient Name: Date of Service: [name], [name] 07/26/2022 7:30 A M Medical Record Number: [id number] Patient Account Number: [id number] Date of Birth/Sex: Treating RN: August 02, 1960 (62 y.o. F) Primary Care [place]: [name] Other Clinician: Referring Sherril Heyward: Treating Raelle Chambers/Extender:  Heinz Knuckles Weeks in Treatment: 26 Vital Signs Height(in): 61 Pulse(bpm): 87 Weight(lbs): 277 Blood Pressure(mmHg): 140/88 Body Mass Index(BMI): 52.3 Temperature(F): 98 Respiratory Rate(breaths/min): 20 [2:Photos:] [N/A:N/A] Right, Posterior Lower Leg N/A N/A Wound Location: Insect Bite N/A N/A Wounding Event: Venous Leg Ulcer N/A N/A Primary Etiology: Anemia, Sleep Apnea, Hypertension, N/A N/A Comorbid History: Peripheral Venous Disease 11/21/2021 N/A N/A Date Acquired: 52 N/A N/A Weeks of Treatment: Open N/A N/A Wound Status: No N/A N/A Wound Recurrence: 2.3x1.8x0.1 N/A N/A Measurements L x W x D (cm) 3.252 N/A N/A A (cm) : rea 0.325 N/A N/A Volume (cm) : -885.50% N/A N/A % Reduction in A rea: -228.30% N/A N/A % Reduction in Volume: Full Thickness Without Exposed N/A N/A Classification: Support Structures Medium N/A N/A Exudate A [name]: Serosanguineous N/A N/A Exudate Type: red, brown N/A N/A Exudate Color: Distinct, outline attached N/A N/A Wound Margin: Medium (34-66%) N/A  N/A Granulation A [name]: Red, Pink N/A N/A Granulation Quality: Medium (34-66%) N/A N/A Necrotic A [name]: Fat Layer (Subcutaneous Tissue): Yes N/A N/A Exposed Structures: Fascia: No Tendon: No Muscle: No Joint: No Bone: No Small (1-33%) N/A N/A Epithelialization: Debridement - Selective/Open Wound N/A N/A Debridement: Pre-procedure Verification/Time Out 07:55 N/A N/A Taken: Lidocaine 4% Topical Solution N/A N/A Pain Control: Slough N/A N/A Tissue Debrided: Non-Viable Tissue N/A N/A Level: 4.14 N/A N/A Debridement A (sq cm): rea Curette N/A N/A Instrument: Minimum N/A N/A Bleeding: Pressure N/A N/A Hemostasis A chieved: 0 N/A N/A Procedural Pain: 0 N/A N/A Post Procedural Pain: Procedure was tolerated well N/A N/A Debridement Treatment Response: 2.3x1.8x0.1 N/A N/A Post Debridement Measurements L x W x D (cm) 0.325 N/A N/A Post Debridement Volume: (cm) Excoriation: No N/A N/A Periwound Skin Texture: Scarring: No No Abnormalities Noted N/A N/A Periwound Skin Moisture: Hemosiderin Staining: Yes N/A N/A Periwound Skin Color: Atrophie Blanche: No Ecchymosis: No Erythema: No Rubor: No No Abnormality N/A N/A Temperature: Compression Therapy N/A N/A Procedures Performed: Debridement Treatment Notes Wound #2 (Lower Leg) Wound Laterality: Right, Posterior Cleanser Soap and Water Discharge Instruction: May shower and wash wound with dial antibacterial soap and water prior to dressing change. Wound Cleanser Discharge Instruction: Cleanse the wound with wound cleanser prior to applying a clean dressing using gauze sponges, not tissue or cotton balls. Peri-Wound Care Sween Lotion (Moisturizing lotion) Discharge Instruction: Apply moisturizing lotion as directed Topical Primary Dressing Hydrofera Blue Classic Foam, 2x2 in Discharge Instruction: Moisten with saline prior to applying to wound bed Secondary Dressing Woven Gauze Sponge, Non-Sterile 4x4  in BRITTANY, OSIER ([id number]) [id number] Page 4 of 8 Discharge Instruction: Apply over primary dressing as directed. Secured With Compression Wrap Urgo K2 Lite, two layer compression system, regular Discharge Instruction: Apply Urgo K2 Lite as directed (alternative to 3 layer compression). Compression Stockings Add-Ons Electronic Signature(s) Signed: 07/26/2022 8:24:10 AM By: [name] MD FACS Entered By: [name] on 07/26/2022 08:24:10 -------------------------------------------------------------------------------- Multi-Disciplinary Care Plan Details Patient Name: Date of Service: [place], [name] 07/26/2022 7:30 A M Medical Record Number: [id number] Patient Account Number: [id number] Date of Birth/Sex: Treating RN: 02-14-1961 (62 y.o. [name] Primary Care Rakeya Glab: [name] Other Clinician: Referring Laekyn Rayos: Treating Jalilah Wiltsie/Extender: [name] in Treatment: 26 Multidisciplinary Care Plan reviewed with physician Active Inactive Necrotic Tissue Nursing Diagnoses: Impaired tissue integrity related to necrotic/devitalized tissue Knowledge deficit related to management of necrotic/devitalized tissue Goals: Necrotic/devitalized tissue will be minimized in the wound bed Date Initiated: 01/23/2022 Target Resolution Date: 08/02/2022 Goal  Status: Active Patient/caregiver will verbalize understanding of reason and process for debridement of necrotic tissue Date Initiated: 01/23/2022 Target Resolution Date: 08/02/2022 Goal Status: Active Interventions: Assess patient pain level pre-, during and post procedure and prior to discharge Provide education on necrotic tissue and debridement process Treatment Activities: Apply topical anesthetic as ordered : 01/23/2022 Notes: Venous Leg Ulcer Nursing Diagnoses: Knowledge deficit related to disease process and management Potential for venous  Insuffiency (use before diagnosis confirmed) Goals: Patient will maintain optimal edema control Date Initiated: 07/05/2022 Target Resolution Date: 08/02/2022 Goal Status: Active Interventions: Assess peripheral edema status every visit. Compression as ordered Provide education on venous insufficiency Treatment Activities: Therapeutic compression applied : 07/05/2022 ASHANTIA, AMARAL ([id number]) [id number].pdf Page 5 of 8 Notes: Wound/Skin Impairment Nursing Diagnoses: Impaired tissue integrity Knowledge deficit related to ulceration/compromised skin integrity Goals: Patient/caregiver will verbalize understanding of skin care regimen Date Initiated: 01/23/2022 Target Resolution Date: 08/02/2022 Goal Status: Active Interventions: Assess patient/caregiver ability to obtain necessary supplies Assess ulceration(s) every visit Treatment Activities: Skin care regimen initiated : 01/23/2022 Topical wound management initiated : 01/23/2022 Notes: Electronic Signature(s) Signed: 07/26/2022 5:46:14 PM By: [name] RN, BSN Entered By: [name] on 07/26/2022 07:52:24 -------------------------------------------------------------------------------- Pain Assessment Details Patient Name: Date of Service: [name], [name] [name] 07/26/2022 7:30 A M Medical Record Number: [id number] Patient Account Number: [id number] Date of Birth/Sex: Treating RN: 09-28-1960 (62 y.o. [name] Primary Care Natan Hartog: [name] Other Clinician: Referring Randell Detter: Treating Shelbee Apgar/Extender: [name] in Treatment: 26 Active Problems Location of Pain Severity and Description of Pain Patient Has Paino No Site Locations Rate the pain. Current Pain Level: 0 Pain Management and Medication Current Pain Management: Electronic Signature(s) Signed: 07/26/2022 5:46:14 PM By: [name] RN, BSN Entered By: [name] on  07/26/2022 07:43:14 [name] ([id number]) [id number] Page 6 of 8 -------------------------------------------------------------------------------- Patient/Caregiver Education Details Patient Name: Date of Service: [facility], [name] 4/19/2024andnbsp7:30 A M Medical Record Number: [id number] Patient Account Number: [id number] Date of Birth/Gender: Treating RN: 1961-04-04 (62 y.o. [name] Primary Care Physician: [name] Other Clinician: Referring Physician: Treating Physician/Extender: [name] in Treatment: 26 Education Assessment Education Provided To: Patient Education Topics Provided Venous: Methods: Explain/Verbal Responses: Reinforcements needed, State content correctly Electronic Signature(s) Signed: 07/26/2022 5:46:14 PM By: [name] RN, BSN Entered By: [name] on 07/26/2022 07:52:58 -------------------------------------------------------------------------------- Wound Assessment Details Patient Name: Date of Service: [name], [name] [name] 07/26/2022 7:30 A M Medical Record Number: [id number] Patient Account Number: [id number] Date of Birth/Sex: Treating RN: December 19, 1960 (62 y.o. [name] Primary Care Dody Smartt: [name] Other Clinician: Referring Velicia Dejager: Treating Chloie Loney/Extender: [name] in Treatment: 26 Wound Status Wound Number: 2 Primary Venous Leg Ulcer Etiology: Wound Location: Right, Posterior Lower Leg Wound Status: Open Wounding Event: Insect Bite Comorbid Anemia, Sleep Apnea, Hypertension, Peripheral Venous Date Acquired: 11/21/2021 History: Disease Weeks Of Treatment: 26 Clustered Wound: No Photos Wound Measurements Length: (cm) 2.3 Width: (cm) 1.8 Depth: (cm) 0.1 Area: (cm) 3.252 Volume: (cm) 0.325 % Reduction in Area: -885.5% % Reduction in Volume: -228.3% Epithelialization: Small  (1-33%) Tunneling: No Undermining: No Wound Description Classification: Full Thickness Without Exposed Support Structures Wound Margin: Distinct, outline attached Exudate Amount: Medium Exudate Type: Serosanguineous Exudate Color: red, brown LEANNAH, GUSE ([id number]) Foul Odor After Cleansing: No Slough/Fibrino Yes [id number].pdf Page 7 of 8 Wound Bed Granulation Amount: Medium (34-66%) Exposed Structure Granulation Quality: Red, Pink Fascia Exposed: No  Necrotic Amount: Medium (34-66%) Fat Layer (Subcutaneous Tissue) Exposed: Yes Necrotic Quality: Adherent Slough Tendon Exposed: No Muscle Exposed: No Joint Exposed: No Bone Exposed: No Periwound Skin Texture Texture Color No Abnormalities Noted: Yes No Abnormalities Noted: No Atrophie Blanche: No Moisture Ecchymosis: No No Abnormalities Noted: Yes Erythema: No Hemosiderin Staining: Yes Rubor: No Temperature / Pain Temperature: No Abnormality Treatment Notes Wound #2 (Lower Leg) Wound Laterality: Right, Posterior Cleanser Soap and Water Discharge Instruction: May shower and wash wound with dial antibacterial soap and water prior to dressing change. Wound Cleanser Discharge Instruction: Cleanse the wound with wound cleanser prior to applying a clean dressing using gauze sponges, not tissue or cotton balls. Peri-Wound Care Sween Lotion (Moisturizing lotion) Discharge Instruction: Apply moisturizing lotion as directed Topical Primary Dressing Hydrofera Blue Classic Foam, 2x2 in Discharge Instruction: Moisten with saline prior to applying to wound bed Secondary Dressing Woven Gauze Sponge, Non-Sterile 4x4 in Discharge Instruction: Apply over primary dressing as directed. Secured With Compression Wrap Urgo K2 Lite, two layer compression system, regular Discharge Instruction: Apply Urgo K2 Lite as directed (alternative to 3 layer compression). Compression Stockings Add-Ons Electronic  Signature(s) Signed: 07/26/2022 5:46:14 PM By: [name] RN, BSN Entered By: [name] on 07/26/2022 07:50:17 -------------------------------------------------------------------------------- Vitals Details Patient Name: Date of Service: [name], [name] 07/26/2022 7:30 A M Medical Record Number: [id number] Patient Account Number: [id number] Date of Birth/Sex: Treating RN: [birth year] (62 y.o. [name] Primary Care [facility]: [name] Other Clinician: Referring Sandar Krinke: Treating Taline Nass/Extender: [name] in Treatment: 26 Vital Signs Time Taken: 07:42 Temperature (F): 98 Height (in): 61 Pulse (bpm): 87 [name], OLLINGER [name] ([id number]) [phone].pdf Page 8 of 8 Weight (lbs): 277 Respiratory Rate (breaths/min): 20 Body Mass Index (BMI): 52.3 Blood Pressure (mmHg): 140/88 Reference Range: 80 - 120 mg / dl Electronic Signature(s) Signed: 07/26/2022 5:46:14 PM By: [name] RN, BSN Entered By: [name] on 07/26/2022 07:43:06

## 2022-07-29 NOTE — Telephone Encounter (Signed)
Spoke to [employer] at [place].  She states order is in process and she will call pt with update.  I went ahead and called pt's work # that she gave me this morning and made her aware.  Nothing further needed.

## 2022-07-29 NOTE — Telephone Encounter (Signed)
Order was place don 2/15 and sent to [facility] on 2/16.  Confirmation received from Terri on 2/28.  Tried to call [employer] this morning and was immediately put on hold.  Will call back.

## 2022-08-02 ENCOUNTER — Encounter (HOSPITAL_BASED_OUTPATIENT_CLINIC_OR_DEPARTMENT_OTHER): Payer: BC Managed Care – PPO | Admitting: General Surgery

## 2022-08-02 DIAGNOSIS — L97212 Non-pressure chronic ulcer of right calf with fat layer exposed: Secondary | ICD-10-CM | POA: Diagnosis not present

## 2022-08-03 NOTE — Progress Notes (Signed)
[name], [name] ([id number]) [id number] Page 1 of 10 Visit Report for 08/02/2022 Arrival Information Details Patient Name: Date of Service: [name], [name] 08/02/2022 2:45 PM Medical Record Number: [id number] Patient Account Number: [id number] Date of Birth/Sex: Treating RN: 1961/01/29 (62 y.o. F) Primary Care Imagene Boss: Nira Conn Other Clinician: Referring Jerriann Schrom: Treating Trip Cavanagh/Extender: Andrey Campanile in Treatment: 27 Visit Information History Since Last Visit All ordered tests and consults were completed: No Patient Arrived: Ambulatory Added or deleted any medications: No Arrival Time: 14:58 Any new allergies or adverse reactions: No Accompanied By: self Had a fall or experienced change in No Transfer Assistance: [name] activities of daily living that may affect Patient Identification Verified: Yes risk of falls: Secondary Verification Process Completed: Yes Signs or symptoms of abuse/neglect since last visito No Patient Requires Transmission-Based Precautions: No Hospitalized since last visit: No Patient Has Alerts: No Implantable device outside of the clinic excluding No cellular tissue based products placed in the center since last visit: Has Dressing in Place as Prescribed: Yes Has Compression in Place as Prescribed: Yes Pain Present Now: Yes Electronic Signature(s) Signed: 08/02/2022 4:53:34 PM By: [name] RN, BSN Entered By: [name] on 08/02/2022 15:22:24 -------------------------------------------------------------------------------- Compression Therapy Details Patient Name: Date of Service: [name], [name] 08/02/2022 2:45 PM Medical Record Number: [id number] Patient Account Number: [id number] Date of Birth/Sex: Treating RN: [birth year] (62 y.o. [name] Primary Care Mckaila Duffus: Nira Conn Other Clinician: Referring Allia Wiltsey: Treating Jenaveve Fenstermaker/Extender: Andrey Campanile in Treatment: 27 Compression Therapy Performed for Wound Assessment: Wound #2 Right,Posterior Lower Leg Performed By: Clinician [name], RN Compression Type: Double Layer Post Procedure Diagnosis Same as Pre-procedure Notes Stephannie Li Electronic Signature(s) Signed: 08/02/2022 4:53:34 PM By: [name] RN, BSN Entered By: [name] on 08/02/2022 15:40:04 -------------------------------------------------------------------------------- Compression Therapy Details Patient Name: Date of Service: [name], [name] 08/02/2022 2:45 PM Medical Record Number: [id number] Patient Account Number: [id number] Date of Birth/Sex: Treating RN: September 26, 1960 (62 y.o. [name] Primary Care Caysen Whang: Nira Conn Other Clinician: Referring Delaina Fetsch: Treating Shiloh Swopes/Extender: Andrey Campanile in Treatment: 27 Compression Therapy Performed for Wound Assessment: Wound #3 Left,Posterior Lower Leg Performed By: Serafina Royals, RN [address], Romie Minus ([id number]) [id number] Page 2 of 10 Compression Type: Double Layer Post Procedure Diagnosis Same as Pre-procedure Notes urgo Production assistant, radio) Signed: 08/02/2022 4:53:34 PM By: [name] RN, BSN Entered By: [name] on 08/02/2022 15:40:04 -------------------------------------------------------------------------------- Encounter Discharge Information Details Patient Name: Date of Service: [address], [name] 08/02/2022 2:45 PM Medical Record Number: [id number] Patient Account Number: [id number] Date of Birth/Sex: Treating RN: [birth year] (62 y.o. [name] Primary Care Ernisha Sorn: Nira Conn Other Clinician: Referring Andalyn Heckstall: Treating Philippe Gang/Extender: Andrey Campanile in Treatment: 27 Encounter Discharge Information Items Post Procedure Vitals Discharge Condition:  Stable Temperature (F): 98.3 Ambulatory Status: Ambulatory Pulse (bpm): 107 Discharge Destination: Home Respiratory Rate (breaths/min): 18 Transportation: Private Auto Blood Pressure (mmHg): 118/72 Accompanied By: self Schedule Follow-up Appointment: Yes Clinical Summary of Care: Patient Declined Electronic Signature(s) Signed: 08/02/2022 4:53:34 PM By: [name] RN, BSN Entered By: [name] on 08/02/2022 15:57:17 -------------------------------------------------------------------------------- Lower Extremity Assessment Details Patient Name: Date of Service: [name], [name] 08/02/2022 2:45 PM Medical Record Number: [id number] Patient Account Number: [id number] Date of Birth/Sex: Treating RN: 08-Mar-1961 (62 y.o. [name] Primary Care Gladyce Mcray: Nira Conn Other Clinician: Referring Jaeline Whobrey: Treating Vega Stare/Extender: Andrey Campanile in  Treatment: 27 Edema Assessment Assessed: [Left: No] [Right: No] Edema: [Left: Yes] [Right: Yes] Calf Left: Right: Point of Measurement: From Medial Instep 40.5 cm 41 cm Ankle Left: Right: Point of Measurement: From Medial Instep 24 cm 23 cm Vascular Assessment Pulses: Dorsalis Pedis Palpable: [Left:No] [Right:No] Electronic Signature(s) Signed: 08/02/2022 4:53:34 PM By: [name] RN, BSN [place],Signed: 08/02/2022 4:53:34 PM By: [name] RN, BSN Romie Minus ([id number]) [phone].pdf Page 3 of 10 Entered By: [name] on 08/02/2022 15:25:52 -------------------------------------------------------------------------------- Multi Wound Chart Details Patient Name: Date of Service: [name] 08/02/2022 2:45 PM Medical Record Number: [id number] Patient Account Number: [id number] Date of Birth/Sex: Treating RN: 1960-04-13 (62 y.o. F) Primary Care Alejandra Barna: Nira Conn Other Clinician: Referring Tykeisha Peer: Treating Merly Hinkson/Extender:  Andrey Campanile in Treatment: 27 Vital Signs Height(in): 61 Pulse(bpm): 107 Weight(lbs): 277 Blood Pressure(mmHg): 118/72 Body Mass Index(BMI): 52.3 Temperature(F): 98.3 Respiratory Rate(breaths/min): 20 [2:Photos:] [N/A:N/A] Right, Posterior Lower Leg Left, Posterior Lower Leg N/A Wound Location: Insect Bite Not Known N/A Wounding Event: Venous Leg Ulcer T be determined o N/A Primary Etiology: Anemia, Sleep Apnea, Hypertension, Anemia, Sleep Apnea, Hypertension, N/A Comorbid History: Peripheral Venous Disease Peripheral Venous Disease 11/21/2021 06/28/2022 N/A Date Acquired: 27 0 N/A Weeks of Treatment: Open Open N/A Wound Status: No No N/A Wound Recurrence: 2.3x2x0.1 1.3x1.3x0.1 N/A Measurements L x W x D (cm) 3.613 1.327 N/A A (cm) : rea 0.361 0.133 N/A Volume (cm) : -994.80% N/A N/A % Reduction in A rea: -264.60% N/A N/A % Reduction in Volume: Full Thickness Without Exposed Full Thickness Without Exposed N/A Classification: Support Structures Support Structures Medium Small N/A Exudate A mount: Serosanguineous Serous N/A Exudate Type: red, brown amber N/A Exudate Color: Distinct, outline attached Flat and Intact N/A Wound Margin: Medium (34-66%) [name] Present (0%) N/A Granulation A mount: Red, Pink N/A N/A Granulation Quality: Medium (34-66%) Large (67-100%) N/A Necrotic A mount: Fat Layer (Subcutaneous Tissue): Yes Fat Layer (Subcutaneous Tissue): Yes N/A Exposed Structures: Fascia: No Fascia: No Tendon: No Tendon: No Muscle: No Muscle: No Joint: No Joint: No Bone: No Bone: No Small (1-33%) [name] N/A Epithelialization: Debridement - Excisional Debridement - Excisional N/A Debridement: Pre-procedure Verification/Time Out 15:35 15:35 N/A Taken: Lidocaine 4% Topical Solution Lidocaine 4% Topical Solution N/A Pain Control: Subcutaneous, Slough Subcutaneous, Slough N/A Tissue Debrided: Skin/Subcutaneous Tissue  Skin/Subcutaneous Tissue N/A Level: 3.61 1.33 N/A Debridement A (sq cm): rea Curette Curette N/A Instrument: Minimum Minimum N/A Bleeding: Pressure Pressure N/A Hemostasis A chieved: 3 3 N/A Procedural Pain: 2 2 N/A Post Procedural Pain: Procedure was tolerated well Procedure was tolerated well N/A Debridement Treatment Response: 2.3x2x0.1 1.3x1.3x0.1 N/A Post Debridement Measurements L x W x D (cm) 0.361 0.133 N/A Post Debridement Volume: (cm) Excoriation: No Rash: Yes N/A Periwound Skin Texture: Scarring: No SOSHA, SHEPHERD ([id number]) [id number] Page 4 of 10 No Abnormalities Noted No Abnormalities Noted N/A Periwound Skin Moisture: Hemosiderin Staining: Yes No Abnormalities Noted N/A Periwound Skin Color: Atrophie Blanche: No Ecchymosis: No Erythema: No Rubor: No No Abnormality No Abnormality N/A Temperature: N/A Yes N/A Tenderness on Palpation: Compression Therapy Compression Therapy N/A Procedures Performed: Debridement Debridement Treatment Notes Wound #2 (Lower Leg) Wound Laterality: Right, Posterior Cleanser Soap and Water Discharge Instruction: May shower and wash wound with dial antibacterial soap and water prior to dressing change. Wound Cleanser Discharge Instruction: Cleanse the wound with wound cleanser prior to applying a clean dressing using gauze sponges, not tissue or cotton balls. Peri-Wound Care Sween Lotion (Moisturizing lotion) Discharge  Instruction: Apply moisturizing lotion as directed Topical Primary Dressing Maxorb Extra Ag+ Alginate Dressing, 2x2 (in/in) Discharge Instruction: Apply to wound bed as instructed Secondary Dressing Woven Gauze Sponge, Non-Sterile 4x4 in Discharge Instruction: Apply over primary dressing as directed. Secured With Compression Wrap Urgo K2 Lite, two layer compression system, regular Discharge Instruction: Apply Urgo K2 Lite as directed (alternative to 3 layer  compression). Compression Stockings Add-Ons Wound #3 (Lower Leg) Wound Laterality: Left, Posterior Cleanser Soap and Water Discharge Instruction: May shower and wash wound with dial antibacterial soap and water prior to dressing change. Wound Cleanser Discharge Instruction: Cleanse the wound with wound cleanser prior to applying a clean dressing using gauze sponges, not tissue or cotton balls. Peri-Wound Care Sween Lotion (Moisturizing lotion) Discharge Instruction: Apply moisturizing lotion as directed Topical Primary Dressing Maxorb Extra Ag+ Alginate Dressing, 2x2 (in/in) Discharge Instruction: Apply to wound bed as instructed Secondary Dressing Woven Gauze Sponge, Non-Sterile 4x4 in Discharge Instruction: Apply over primary dressing as directed. Secured With Compression Wrap Urgo K2 Lite, two layer compression system, regular Discharge Instruction: Apply Urgo K2 Lite as directed (alternative to 3 layer compression). Compression Stockings Add-Ons LAPORCHE, MARTELLE ([id number]) [phone].pdf Page 5 of 10 Electronic Signature(s) Signed: 08/02/2022 4:04:54 PM By: [name] MD FACS Entered By: [name] on 08/02/2022 16:04:54 -------------------------------------------------------------------------------- Multi-Disciplinary Care Plan Details Patient Name: Date of Service: [place], [name] 08/02/2022 2:45 PM Medical Record Number: [id number] Patient Account Number: [id number] Date of Birth/Sex: Treating RN: 03-21-1961 (62 y.o. [name] Primary Care Suvi Archuletta: Nira Conn Other Clinician: Referring Hicks Feick: Treating Nhung Danko/Extender: Andrey Campanile in Treatment: 27 Multidisciplinary Care Plan reviewed with physician Active Inactive Necrotic Tissue Nursing Diagnoses: Impaired tissue integrity related to necrotic/devitalized tissue Knowledge deficit related to management of necrotic/devitalized  tissue Goals: Necrotic/devitalized tissue will be minimized in the wound bed Date Initiated: 01/23/2022 Target Resolution Date: 08/30/2022 Goal Status: Active Patient/caregiver will verbalize understanding of reason and process for debridement of necrotic tissue Date Initiated: 01/23/2022 Target Resolution Date: 08/30/2022 Goal Status: Active Interventions: Assess patient pain level pre-, during and post procedure and prior to discharge Provide education on necrotic tissue and debridement process Treatment Activities: Apply topical anesthetic as ordered : 01/23/2022 Notes: Venous Leg Ulcer Nursing Diagnoses: Knowledge deficit related to disease process and management Potential for venous Insuffiency (use before diagnosis confirmed) Goals: Patient will maintain optimal edema control Date Initiated: 07/05/2022 Target Resolution Date: 08/30/2022 Goal Status: Active Interventions: Assess peripheral edema status every visit. Compression as ordered Provide education on venous insufficiency Treatment Activities: Therapeutic compression applied : 07/05/2022 Notes: Wound/Skin Impairment Nursing Diagnoses: Impaired tissue integrity Knowledge deficit related to ulceration/compromised skin integrity Goals: Patient/caregiver will verbalize understanding of skin care regimen Date Initiated: 01/23/2022 Target Resolution Date: 08/30/2022 Goal Status: Active DANALEE, FLATH ([id number]) [phone].pdf Page 6 of 10 Interventions: Assess patient/caregiver ability to obtain necessary supplies Assess ulceration(s) every visit Treatment Activities: Skin care regimen initiated : 01/23/2022 Topical wound management initiated : 01/23/2022 Notes: Electronic Signature(s) Signed: 08/02/2022 4:53:34 PM By: [name] RN, BSN Entered By: [name] on 08/02/2022 15:32:39 -------------------------------------------------------------------------------- Pain Assessment  Details Patient Name: Date of Service: [name], [name] 08/02/2022 2:45 PM Medical Record Number: [id number] Patient Account Number: [id number] Date of Birth/Sex: Treating RN: May 05, 1960 (62 y.o. F) Primary Care Azlan Hanway: Nira Conn Other Clinician: Referring Lamonta Cypress: Treating Elizabth Palka/Extender: Andrey Campanile in Treatment: 27 Active Problems Location of Pain Severity and Description of Pain Patient Has Paino Yes Site  Locations Pain Location: Pain in Ulcers With Dressing Change: Yes Duration of the Pain. Constant / Intermittento Intermittent Rate the pain. Current Pain Level: 3 Worst Pain Level: 5 Least Pain Level: 0 Character of Pain Describe the Pain: Aching Pain Management and Medication Current Pain Management: Medication: Yes Is the Current Pain Management Adequate: Adequate How does your wound impact your activities of daily livingo Sleep: No Bathing: No Appetite: No Relationship With Others: No Bladder Continence: No Emotions: No Bowel Continence: No Work: No Toileting: No Drive: No Dressing: No Hobbies: No Electronic Signature(s) Signed: 08/02/2022 4:53:34 PM By: [name] RN, BSN Entered By: [name] on 08/02/2022 15:23:23 [name] ([id number]) [id number] Page 7 of 10 -------------------------------------------------------------------------------- Patient/Caregiver Education Details Patient Name: Date of Service: [name] 4/26/2024andnbsp2:45 PM Medical Record Number: [id number] Patient Account Number: [id number] Date of Birth/Gender: Treating RN: [birth year] (62 y.o. [name] Primary Care Physician: Nira Conn Other Clinician: Referring Physician: Treating Physician/Extender: Andrey Campanile in Treatment: 27 Education Assessment Education Provided To: Patient Education Topics Provided Venous: Methods:  Explain/Verbal Responses: Reinforcements needed, State content correctly Wound/Skin Impairment: Methods: Explain/Verbal Responses: Reinforcements needed, State content correctly Electronic Signature(s) Signed: 08/02/2022 4:53:34 PM By: [name] RN, BSN Entered By: [name] on 08/02/2022 15:33:29 -------------------------------------------------------------------------------- Wound Assessment Details Patient Name: Date of Service: [name], [name] 08/02/2022 2:45 PM Medical Record Number: [id number] Patient Account Number: [id number] Date of Birth/Sex: Treating RN: [birth year] (62 y.o. [name] Primary Care Damir Leung: Nira Conn Other Clinician: Referring Verlinda Slotnick: Treating Willette Mudry/Extender: Andrey Campanile in Treatment: 27 Wound Status Wound Number: 2 Primary Venous Leg Ulcer Etiology: Wound Location: Right, Posterior Lower Leg Wound Status: Open Wounding Event: Insect Bite Comorbid Anemia, Sleep Apnea, Hypertension, Peripheral Venous Date Acquired: 11/21/2021 History: Disease Weeks Of Treatment: 27 Clustered Wound: No Photos Wound Measurements Length: (cm) 2.3 Width: (cm) 2 Depth: (cm) 0.1 Area: (cm) 3.613 Volume: (cm) 0.361 % Reduction in Area: -994.8% % Reduction in Volume: -264.6% Epithelialization: Small (1-33%) Tunneling: No Undermining: No Wound Description Classification: Full Thickness Without Exposed Support Structures LEEYA, RUSCONI ([id number]) Wound Margin: Distinct, outline attached Exudate Amount: Medium Exudate Type: Serosanguineous Exudate Color: red, brown Foul Odor After Cleansing: No [id number] Page 8 of 10 Slough/Fibrino Yes Wound Bed Granulation Amount: Medium (34-66%) Exposed Structure Granulation Quality: Red, Pink Fascia Exposed: No Necrotic Amount: Medium (34-66%) Fat Layer (Subcutaneous Tissue) Exposed: Yes Necrotic Quality: Adherent Slough Tendon  Exposed: No Muscle Exposed: No Joint Exposed: No Bone Exposed: No Periwound Skin Texture Texture Color No Abnormalities Noted: Yes No Abnormalities Noted: No Atrophie Blanche: No Moisture Ecchymosis: No No Abnormalities Noted: Yes Erythema: No Hemosiderin Staining: Yes Rubor: No Temperature / Pain Temperature: No Abnormality Treatment Notes Wound #2 (Lower Leg) Wound Laterality: Right, Posterior Cleanser Soap and Water Discharge Instruction: May shower and wash wound with dial antibacterial soap and water prior to dressing change. Wound Cleanser Discharge Instruction: Cleanse the wound with wound cleanser prior to applying a clean dressing using gauze sponges, not tissue or cotton balls. Peri-Wound Care Sween Lotion (Moisturizing lotion) Discharge Instruction: Apply moisturizing lotion as directed Topical Primary Dressing Maxorb Extra Ag+ Alginate Dressing, 2x2 (in/in) Discharge Instruction: Apply to wound bed as instructed Secondary Dressing Woven Gauze Sponge, Non-Sterile 4x4 in Discharge Instruction: Apply over primary dressing as directed. Secured With Compression Wrap Urgo K2 Lite, two layer compression system, regular Discharge Instruction: Apply Urgo K2 Lite as directed (alternative to 3 layer compression). Compression  Stockings Facilities manager) Signed: 08/02/2022 4:53:34 PM By: [name] RN, BSN Entered By: [name] on 08/02/2022 15:27:56 -------------------------------------------------------------------------------- Wound Assessment Details Patient Name: Date of Service: [name], [name] 08/02/2022 2:45 PM Medical Record Number: [id number] Patient Account Number: [id number] Date of Birth/Sex: Treating RN: 24-Dec-1960 (62 y.o. F) Primary Care Royanne Warshaw: Nira Conn Other Clinician: Referring Ceniyah Thorp: Treating Charli Liberatore/Extender: Andrey Campanile in Treatment: [address] [place] ([id number])  [id number] Page 9 of 10 Wound Status Wound Number: 3 Primary T be determined o Etiology: Wound Location: Left, Posterior Lower Leg Wound Status: Open Wounding Event: Not Known Comorbid Anemia, Sleep Apnea, Hypertension, Peripheral Venous Date Acquired: 06/28/2022 History: Disease Weeks Of Treatment: 0 Clustered Wound: No Photos Wound Measurements Length: (cm) 1.3 Width: (cm) 1.3 Depth: (cm) 0.1 Area: (cm) 1.327 Volume: (cm) 0.133 % Reduction in Area: % Reduction in Volume: Epithelialization: [name] Tunneling: No Undermining: No Wound Description Classification: Full Thickness Without Exposed Support Structures Wound Margin: Flat and Intact Exudate Amount: Small Exudate Type: Serous Exudate Color: amber Foul Odor After Cleansing: No Slough/Fibrino Yes Wound Bed Granulation Amount: [name] Present (0%) Exposed Structure Necrotic Amount: Large (67-100%) Fascia Exposed: No Necrotic Quality: Adherent Slough Fat Layer (Subcutaneous Tissue) Exposed: Yes Tendon Exposed: No Muscle Exposed: No Joint Exposed: No Bone Exposed: No Periwound Skin Texture Texture Color No Abnormalities Noted: No No Abnormalities Noted: Yes Rash: Yes Temperature / Pain Temperature: No Abnormality Moisture No Abnormalities Noted: Yes Tenderness on Palpation: Yes Electronic Signature(s) Signed: 08/02/2022 4:53:34 PM By: [name] RN, BSN Entered By: [name] on 08/02/2022 15:29:27 -------------------------------------------------------------------------------- Vitals Details Patient Name: Date of Service: [name], [name] 08/02/2022 2:45 PM Medical Record Number: [id number] Patient Account Number: [id number] Date of Birth/Sex: Treating RN: [birth year] (62 y.o. F) Primary Care Aariv Medlock: Nira Conn Other Clinician: Referring Ketsia Linebaugh: Treating Avedis Bevis/Extender: Andrey Campanile in Treatment: 27 Vital Signs Time Taken:  02:58 Temperature (F): 98.3 Height (in): 61 Pulse (bpm): 107 MADELL, HEINO ([id number]) [phone].pdf Page 10 of 10 Weight (lbs): 277 Respiratory Rate (breaths/min): 20 Body Mass Index (BMI): 52.3 Blood Pressure (mmHg): 118/72 Reference Range: 80 - 120 mg / dl Electronic Signature(s) Signed: 08/02/2022 4:53:34 PM By: [name] RN, BSN Entered By: [name] on 08/02/2022 15:22:29

## 2022-08-03 NOTE — Progress Notes (Signed)
[name] ([id number]) [id number] Page 1 of 12 Visit Report for 08/02/2022 Chief Complaint Document Details Patient Name: Date of Service: [place] 08/02/2022 2:45 PM Medical Record Number: [id number] Patient Account Number: [id number] Date of Birth/Sex: Treating RN: Apr 22, 1960 (62 y.o. F) Primary Care Provider: Nira [name] Other Clinician: Referring Provider: Treating Provider/Extender: [name] in Treatment: 27 Information Obtained from: Patient Chief Complaint RLE ulcer Electronic Signature(s) Signed: 08/02/2022 4:05:02 PM By: [name] Guess MD FACS Entered By: [name] on 08/02/2022 16:05:02 -------------------------------------------------------------------------------- Debridement Details Patient Name: Date of Service: [name], [name] [name] H. 08/02/2022 2:45 PM Medical Record Number: [id number] Patient Account Number: [id number] Date of Birth/Sex: Treating RN: 01/10/1961 (62 y.o. [name], [name] Primary Care Provider: Nira [name] Other Clinician: Referring Provider: Treating Provider/Extender: [name] in Treatment: 27 Debridement Performed for Assessment: Wound #3 Left,Posterior Lower Leg Performed By: Physician [name] Guess, MD Debridement Type: Debridement Severity of Tissue Pre Debridement: Fat layer exposed Level of Consciousness (Pre-procedure): Awake and Alert Pre-procedure Verification/Time Out Yes - 15:35 Taken: Start Time: 15:36 Pain Control: Lidocaine 4% T opical Solution Percent of Wound Bed Debrided: 100% T Area Debrided (cm): otal 1.33 Tissue and other material debrided: Viable, Non-Viable, Slough, Subcutaneous, Slough Level: Skin/Subcutaneous Tissue Debridement Description: Excisional Instrument: Curette Bleeding: Minimum Hemostasis Achieved: Pressure Procedural Pain: 3 Post Procedural Pain: 2 Response to Treatment:  Procedure was tolerated well Level of Consciousness (Post- Awake and Alert procedure): Post Debridement Measurements of Total Wound Length: (cm) 1.3 Width: (cm) 1.3 Depth: (cm) 0.1 Volume: (cm) 0.133 Character of Wound/Ulcer Post Debridement: Improved Severity of Tissue Post Debridement: Fat layer exposed Post Procedure Diagnosis Same as Pre-procedure Notes scribed for Dr. Lady Gary by [name] Deed, RN Electronic Signature(s) [name] ([id number]) [phone].pdf Page 2 of 12 Signed: 08/02/2022 4:46:59 PM By: [name] Guess MD FACS Signed: 08/02/2022 4:53:34 PM By: [name] Deed RN, BSN Entered By: [name] on 08/02/2022 15:40:53 -------------------------------------------------------------------------------- Debridement Details Patient Name: Date of Service: [place], [name] [name] H. 08/02/2022 2:45 PM Medical Record Number: [id number] Patient Account Number: [id number] Date of Birth/Sex: Treating RN: [birth year] (62 y.o. [name] Primary Care Provider: Nira [name] Other Clinician: Referring Provider: Treating Provider/Extender: [name] in Treatment: 27 Debridement Performed for Assessment: Wound #2 Right,Posterior Lower Leg Performed By: Physician [name] Guess, MD Debridement Type: Debridement Severity of Tissue Pre Debridement: Fat layer exposed Level of Consciousness (Pre-procedure): Awake and Alert Pre-procedure Verification/Time Out Yes - 15:35 Taken: Start Time: 15:36 Pain Control: Lidocaine 4% T opical Solution Percent of Wound Bed Debrided: 100% T Area Debrided (cm): otal 3.61 Tissue and other material debrided: Viable, Non-Viable, Slough, Subcutaneous, Slough Level: Skin/Subcutaneous Tissue Debridement Description: Excisional Instrument: Curette Bleeding: Minimum Hemostasis Achieved: Pressure Procedural Pain: 3 Post Procedural Pain: 2 Response to Treatment: Procedure was  tolerated well Level of Consciousness (Post- Awake and Alert procedure): Post Debridement Measurements of Total Wound Length: (cm) 2.3 Width: (cm) 2 Depth: (cm) 0.1 Volume: (cm) 0.361 Character of Wound/Ulcer Post Debridement: Improved Severity of Tissue Post Debridement: Fat layer exposed Post Procedure Diagnosis Same as Pre-procedure Notes scribed for Dr. Lady Gary by [name] Deed, RN Electronic Signature(s) Signed: 08/02/2022 4:46:59 PM By: [name] Guess MD FACS Signed: 08/02/2022 4:53:34 PM By: [name] Deed RN, BSN Entered By: [name] on 08/02/2022 15:42:23 -------------------------------------------------------------------------------- HPI Details Patient Name: Date of Service: [name], [name] [name] H. 08/02/2022 2:45 PM Medical Record Number: [id number] Patient Account Number: [id number] Date of Birth/Sex:  Treating RN: July 03, 1960 (62 y.o. F) Primary Care Provider: Nira [name] Other Clinician: Referring Provider: Treating Provider/Extender: [name] in Treatment: 27 History of Present Illness HPI Description: 02/16/2020 upon evaluation today patient actually appears to be doing poorly in regard to her left medial lower extremity ulcer. This is actually an area that she tells me she has had intermittent issues with over the years although has been closed for some time she typically uses compression right now she has juxta lite compression wraps. With that being said she tells me that this nonetheless open several weeks/months ago and has been given her [name], [name] ([id number]) [id number] Page 3 of 12 trouble since. She does have a history of chronic venous insufficiency she is seeing specialist for this in the past she has had an ablation as well as sclerotherapy. With that being said she also has hypertension chronically which is managed by her primary care provider. In general she seems to  be worsening overall with regard to the wound and states that she finally realized that she needed to come in and have somebody look at this and not continue to try to manage this on her own. No fevers, chills, nausea, vomiting, or diarrhea. 02/23/2020 on evaluation today patient appears to be doing well with regard to her wound. This is showing some signs of improvement which is great news still were not quite at the point where I would like to be as far as the overall appearance of the wound is concerned but I do believe this is better than last week. I do believe the Iodoflex is helping as well. 03/08/2020 upon evaluation today patient appears to be doing well with regard to her wound. She has been tolerating the dressing changes without complication. Fortunately I feel like she has made great progress with the Iodoflex but I feel like it may be the point rest to switch to something else possibly a collagen- based dressing at this time. 03/15/2020 upon evaluation today patient appears to be doing excellent in regard to her leg ulcer. She has been tolerating the dressing changes without complication. Fortunately there is no signs of active infection. Overall she is measuring a little bit smaller today which is great news. 03/22/2020 upon evaluation today patient appears to be doing well with regard to her wound. She has been tolerating the dressing changes without complication. Fortunately there is no signs of active infection at this time. 03/28/2020; patient I do not usually see however she has a wound on the left anterior lower leg secondary to chronic venous insufficiency we have been using silver collagen under compression. She arrives in clinic with a nonviable surface requiring debridement 04/12/2020 upon evaluation today patient appears to be doing well all things considered with regard to her leg ulcer. She is tolerating the dressing changes without complication there is minimal dry skin  around the edges of the wound that may be trapping and stopping some of the events of the new skin I am can work on that today. Otherwise the surface of the wound appears to be doing excellent. 04/19/2020 upon evaluation today patient appears to be doing well with regard to her leg ulcer. She has been tolerating dressing changes without complication. Fortunately there is no signs of active infection at this time. No fever chills noted overall very pleased with how things seem to be progressing. 04/26/2020 on evaluation today patient appears to be doing well with regard to her wound currently. Is showing  signs of excellent improvement overall is filling in nicely and there does not appear to be any signs of infection. No fevers, chills, nausea, vomiting, or diarrhea. 05/03/2020 upon evaluation today patient appears to be doing well with regard to her wound on the leg. This overall showing signs of good improvement which is great she has some good epithelial growth and overall I think that things are moving in the correct direction. We likewise going to continue with the wound care measures as before since she seems to making such good improvement. 2/3; venous wound on the left medial leg. This is contracting. We are using Prisma and 3 layer compression. She has a stocking and waiting in the eventuality this heals. She is already using it on the right 05/17/2020 upon evaluation today patient appears to be doing well with regard to her leg ulcer. She has been tolerating the dressing changes without complication. Fortunately there is no signs of active infection which is great news and overall very pleased with where things stand today. No fevers, chills, nausea, vomiting, or diarrhea. 05/24/2020 upon evaluation today patient appears to be doing well with regard to her wound. Overall I feel like she is making excellent progress. There does not appear to be any signs of active infection which is great  news. 05/31/2020 upon evaluation today patient appears to be doing well with regard to her wound. There does not appear to be any signs of active infection which is great news overall I am extremely pleased with where things stand today. READMISSION 01/23/2022 She returns to clinic today with a new wound on her right posterior calf. She says that she was cleaning out an old shed near the middle of August this year and then noticed what seemed to be a bug bite on her right posterior calf. It was itchy, red, and raised. By the end of September, an ulcer had developed. She has been applying various topical creams such as hydrocortisone and others to the site. When it was not improving, she made an appointment in the wound care center. ABI in clinic today was 0.97. On her right posterior calf, there is a circular wound with necrotic fat and black eschar present. There is no purulent drainage or malodor. The periwound skin is in good condition with just a little induration that appears to be secondary to inflammation. 01/31/2022: The wound measures a little bit larger today, but overall is quite a bit cleaner. There is some undermining from 9:00 to 3:00. She is having some periwound itching and says that her wrap slid. 02/08/2022: Despite using an Unna boot first layer at the top of her wrap, it slid again and it looks like there is been some bruising at the wound site. The wound is about the same size in terms of dimensions. There is a fair amount of slough and nonviable tissue still present. Edema control is better than last week. 02/15/2022: The wound dimensions are about the same. There is less nonviable tissue present. The periwound erythema has improved. 02/22/2022: The wound has deteriorated over the past week. It is larger and the periwound is more edematous, erythematous, and indurated. It looks as though there has been more tissue breakdown with undermining present. She is having more pain. There  is a foul odor coming from the wound. 02/26/2022: The wound looks quite a bit better today and the odor is gone. I still do not have her culture data back, but she seems to be responding well to  the Augmentin. 03/06/2022: Her wound continues to improve. There is still some slough accumulation, but the periwound skin is less inflamed. Her culture returned with a polymicrobial population including Pseudomonas and so levofloxacin was also prescribed. She did not understand why a second antibiotic was being added so she has not yet initiated this. 03/14/2022: The wound is about the same, to perhaps a little bit larger. There is a fair amount of slough accumulation on the surface, as well as some hypertrophic granulation tissue. She is still taking levofloxacin, but has completed taking Augmentin. She has her Jodie Echevaria compound with her today. 12/14; the patient has a significant circular wound on the posterior right calf. She is using Keystone and silver alginate under 3 layer compression. Her ABIs were within normal limits at 0.97. This may have been traumatic or an insect bite at the start I reviewed these records. 04/04/2022: Since I last saw the wound, it has contracted considerably. There is a fairly thick layer of slough on the surface, as it has not been debrided since the last time I did it. Edema control is excellent and the periwound skin is in much better condition. 04/12/2022: No significant change in the wound dimensions. It is filling with granulation tissue. Still with slough accumulation on the surface. 04/19/2022: The wound is smaller this week. There is some slough accumulation on the surface. 04/26/2022: The wound is smaller again this week and significantly cleaner. The wound surface is a little bit drier than ideal. 05/03/2022: The wound measurements were about the same, but visually it appears smaller. There is still some undermining at the top of the wound. Moisture balance is better this  week. 05/10/2022: The wound measured smaller today. There is still a fair amount of undermining present. Slough has built up on the surface. We are still awaiting snap [name], [name] ([id number]) [phone].pdf Page 4 of 12 VAC approval. 05/17/2022: The wound is a little bit smaller today. There is less slough on the surface, but the granulation tissue still is not very robust. She has been approved for snap VAC but will have a 20% coinsurance and it is not clear what that amount would end up being for her. 05/27/2022: The surface of the wound has deteriorated and it is gray and fibrotic. No significant odor, but the drainage on her dressing was a little bit purulent. 05/31/2022: The wound looks quite a bit better today. It is still a little fibrotic but no longer has purulent-looking drainage. The color is better. She spoke with her insurance company and is interested in trying the snap VAC, now that she is aware of the cost to her. 06/07/2022: After 1 week in the snap VAC, there has been substantial improvement to her wound. The undermined portion is closing in. The surface has a healthier color and appearance. There is very minimal slough accumulation. 06/14/2022: The more shallow part of the undermined portion of her wound has closed. There is still some undermining from about 1-2 o'clock. The surface continues to improve. Minimal slough accumulation. 06/28/2022: The wound is shallower this week and the undermining has essentially closed and completely. The surface tissue is improving. 07/05/2022: The depth is almost immeasurable and the surface is nearly flush with the surrounding skin. The undermining has been eliminated. There is light slough on the wound surface. 07/12/2022: There is a little bit of slough on the wound surface. The depth is about the same as last week. 07/19/2022: The wound is essentially flush with the surrounding  skin surface. There is slight slough present. No  real change in the AP or transverse dimensions. 07/26/2022: The wound measured a little bit smaller today. It is flush with the surrounding skin surface and there is good granulation tissue present. Minimal slough and biofilm accumulation. 08/02/2022: The right posterior leg wound is a little bit smaller. There is a little slough accumulation. She reported a new wound to [place] today. It is on her left posterior calf. It has been present for about 6 weeks. She is not sure of how it occurred. She has been trying to manage it at home with topical Neosporin and Band-Aids. The fat layer is exposed. The surface is fibrotic and covered with slough. Electronic Signature(s) Signed: 08/02/2022 4:06:19 PM By: [name] Guess MD FACS Entered By: [name] on 08/02/2022 16:06:19 -------------------------------------------------------------------------------- Physical Exam Details Patient Name: Date of Service: [name], [name] [name] H. 08/02/2022 2:45 PM Medical Record Number: [id number] Patient Account Number: [id number] Date of Birth/Sex: Treating RN: 1960-11-10 (62 y.o. F) Primary Care Provider: Nira [name] Other Clinician: Referring Provider: Treating Provider/Extender: [name] in Treatment: 27 Constitutional . Slightly tachycardic. . . no acute distress. Respiratory Normal work of breathing on room air. Notes 08/02/2022: The right posterior leg wound is a little bit smaller. There is a little slough accumulation. She reported a new wound to [place] today. It is on her left posterior calf. The fat layer is exposed. The surface is fibrotic and covered with slough. Electronic Signature(s) Signed: 08/02/2022 4:07:09 PM By: [name] Guess MD FACS Entered By: [name] on 08/02/2022 16:07:09 -------------------------------------------------------------------------------- Physician Orders Details Patient Name: Date of Service: [address], [name] [name] H. 08/02/2022 2:45  PM Medical Record Number: [id number] Patient Account Number: [id number] Date of Birth/Sex: Treating RN: 1960/05/18 (62 y.o. [name] Primary Care Provider: Nira [name] Other Clinician: Referring Provider: Treating Provider/Extender: [name] in Treatment: 27 Verbal / Phone Orders: No Diagnosis Coding ICD-10 Coding MEGHEN, AKOPYAN ([id number]) [phone].pdf Page 5 of 12 Code Description [phone] Non-pressure chronic ulcer of right calf with fat layer exposed E66.01 Morbid (severe) obesity due to excess calories I10 Essential (primary) hypertension R60.0 Localized edema Follow-up Appointments ppointment in 1 week. - Dr. Lady Gary RM 1 Return A Friday 5/1@ 1:15 pm Anesthetic (In clinic) Topical Lidocaine 4% applied to wound bed Bathing/ Shower/ Hygiene May shower with protection but do not get wound dressing(s) wet. Protect dressing(s) with water repellant cover (for example, large plastic bag) or a cast cover and may then take shower. Edema Control - Lymphedema / SCD / Other Bilateral Lower Extremities Elevate legs to the level of the heart or above for 30 minutes daily and/or when sitting for 3-4 times a day throughout the day. Avoid standing for long periods of time. Exercise regularly Moisturize legs daily. Wound Treatment Wound #2 - Lower Leg Wound Laterality: Right, Posterior Cleanser: Soap and Water 1 x Per Week/30 Days Discharge Instructions: May shower and wash wound with dial antibacterial soap and water prior to dressing change. Cleanser: Wound Cleanser 1 x Per Week/30 Days Discharge Instructions: Cleanse the wound with wound cleanser prior to applying a clean dressing using gauze sponges, not tissue or cotton balls. Peri-Wound Care: Sween Lotion (Moisturizing lotion) 1 x Per Week/30 Days Discharge Instructions: Apply moisturizing lotion as directed Prim Dressing: Maxorb Extra Ag+ Alginate Dressing,  2x2 (in/in) 1 x Per Week/30 Days ary Discharge Instructions: Apply to wound bed as instructed Secondary Dressing: Woven Gauze Sponge, Non-Sterile 4x4  in 1 x Per Week/30 Days Discharge Instructions: Apply over primary dressing as directed. Compression Wrap: Urgo K2 Lite, two layer compression system, regular 1 x Per Week/30 Days Discharge Instructions: Apply Urgo K2 Lite as directed (alternative to 3 layer compression). Wound #3 - Lower Leg Wound Laterality: Left, Posterior Cleanser: Soap and Water 1 x Per Week/30 Days Discharge Instructions: May shower and wash wound with dial antibacterial soap and water prior to dressing change. Cleanser: Wound Cleanser 1 x Per Week/30 Days Discharge Instructions: Cleanse the wound with wound cleanser prior to applying a clean dressing using gauze sponges, not tissue or cotton balls. Peri-Wound Care: Sween Lotion (Moisturizing lotion) 1 x Per Week/30 Days Discharge Instructions: Apply moisturizing lotion as directed Prim Dressing: Maxorb Extra Ag+ Alginate Dressing, 2x2 (in/in) 1 x Per Week/30 Days ary Discharge Instructions: Apply to wound bed as instructed Secondary Dressing: Woven Gauze Sponge, Non-Sterile 4x4 in 1 x Per Week/30 Days Discharge Instructions: Apply over primary dressing as directed. Compression Wrap: Urgo K2 Lite, two layer compression system, regular 1 x Per Week/30 Days Discharge Instructions: Apply Urgo K2 Lite as directed (alternative to 3 layer compression). Electronic Signature(s) Signed: 08/02/2022 4:46:59 PM By: [name] Guess MD FACS Entered By: [name] on 08/02/2022 16:10:36 -------------------------------------------------------------------------------- Problem List Details Patient Name: Date of Service: [name], [name] [name] H. 08/02/2022 2:45 PM Medical Record Number: [id number] Patient Account Number: [id number] BRYTNI, DRAY ([id number]) [id number] Page 6 of 12 Date of Birth/Sex:  Treating RN: [birth year] (62 y.o. [name] Primary Care Provider: Other Clinician: Nira [name] Referring Provider: Treating Provider/Extender: [name] in Treatment: 27 Active Problems ICD-10 Encounter Code Description Active Date MDM Diagnosis L97.212 Non-pressure chronic ulcer of right calf with fat layer exposed 01/23/2022 No Yes L97.222 Non-pressure chronic ulcer of left calf with fat layer exposed 08/02/2022 No Yes E66.01 Morbid (severe) obesity due to excess calories 01/23/2022 No Yes I10 Essential (primary) hypertension 01/23/2022 No Yes R60.0 Localized edema 01/23/2022 No Yes Inactive Problems Resolved Problems Electronic Signature(s) Signed: 08/02/2022 4:04:44 PM By: [name] Guess MD FACS Entered By: [name] on 08/02/2022 16:04:44 -------------------------------------------------------------------------------- Progress Note Details Patient Name: Date of Service: [name], [name] [name] H. 08/02/2022 2:45 PM Medical Record Number: [id number] Patient Account Number: [id number] Date of Birth/Sex: Treating RN: 1961-03-21 (62 y.o. F) Primary Care Provider: Nira [name] Other Clinician: Referring Provider: Treating Provider/Extender: [name] in Treatment: 27 Subjective Chief Complaint Information obtained from Patient RLE ulcer History of Present Illness (HPI) 02/16/2020 upon evaluation today patient actually appears to be doing poorly in regard to her left medial lower extremity ulcer. This is actually an area that she tells me she has had intermittent issues with over the years although has been closed for some time she typically uses compression right now she has juxta lite compression wraps. With that being said she tells me that this nonetheless open several weeks/months ago and has been given her trouble since. She does have a history of chronic venous insufficiency she is seeing  specialist for this in the past she has had an ablation as well as sclerotherapy. With that being said she also has hypertension chronically which is managed by her primary care provider. In general she seems to be worsening overall with regard to the wound and states that she finally realized that she needed to come in and have somebody look at this and not continue to try to manage this on her own. No fevers, chills, nausea,  vomiting, or diarrhea. 02/23/2020 on evaluation today patient appears to be doing well with regard to her wound. This is showing some signs of improvement which is great news still were not quite at the point where I would like to be as far as the overall appearance of the wound is concerned but I do believe this is better than last week. I do believe the Iodoflex is helping as well. 03/08/2020 upon evaluation today patient appears to be doing well with regard to her wound. She has been tolerating the dressing changes without complication. Fortunately I feel like she has made great progress with the Iodoflex but I feel like it may be the point rest to switch to something else possibly a collagen- based dressing at this time. 03/15/2020 upon evaluation today patient appears to be doing excellent in regard to her leg ulcer. She has been tolerating the dressing changes without complication. Fortunately there is no signs of active infection. Overall she is measuring a little bit smaller today which is great news. [name], [name] ([id number]) [id number] Page 7 of 12 03/22/2020 upon evaluation today patient appears to be doing well with regard to her wound. She has been tolerating the dressing changes without complication. Fortunately there is no signs of active infection at this time. 03/28/2020; patient I do not usually see however she has a wound on the left anterior lower leg secondary to chronic venous insufficiency we have been using silver collagen  under compression. She arrives in clinic with a nonviable surface requiring debridement 04/12/2020 upon evaluation today patient appears to be doing well all things considered with regard to her leg ulcer. She is tolerating the dressing changes without complication there is minimal dry skin around the edges of the wound that may be trapping and stopping some of the events of the new skin I am can work on that today. Otherwise the surface of the wound appears to be doing excellent. 04/19/2020 upon evaluation today patient appears to be doing well with regard to her leg ulcer. She has been tolerating dressing changes without complication. Fortunately there is no signs of active infection at this time. No fever chills noted overall very pleased with how things seem to be progressing. 04/26/2020 on evaluation today patient appears to be doing well with regard to her wound currently. Is showing signs of excellent improvement overall is filling in nicely and there does not appear to be any signs of infection. No fevers, chills, nausea, vomiting, or diarrhea. 05/03/2020 upon evaluation today patient appears to be doing well with regard to her wound on the leg. This overall showing signs of good improvement which is great she has some good epithelial growth and overall I think that things are moving in the correct direction. We likewise going to continue with the wound care measures as before since she seems to making such good improvement. 2/3; venous wound on the left medial leg. This is contracting. We are using Prisma and 3 layer compression. She has a stocking and waiting in the eventuality this heals. She is already using it on the right 05/17/2020 upon evaluation today patient appears to be doing well with regard to her leg ulcer. She has been tolerating the dressing changes without complication. Fortunately there is no signs of active infection which is great news and overall very pleased with where things  stand today. No fevers, chills, nausea, vomiting, or diarrhea. 05/24/2020 upon evaluation today patient appears to be doing well with regard to her wound.  Overall I feel like she is making excellent progress. There does not appear to be any signs of active infection which is great news. 05/31/2020 upon evaluation today patient appears to be doing well with regard to her wound. There does not appear to be any signs of active infection which is great news overall I am extremely pleased with where things stand today. READMISSION 01/23/2022 She returns to clinic today with a new wound on her right posterior calf. She says that she was cleaning out an old shed near the middle of August this year and then noticed what seemed to be a bug bite on her right posterior calf. It was itchy, red, and raised. By the end of September, an ulcer had developed. She has been applying various topical creams such as hydrocortisone and others to the site. When it was not improving, she made an appointment in the wound care center. ABI in clinic today was 0.97. On her right posterior calf, there is a circular wound with necrotic fat and black eschar present. There is no purulent drainage or malodor. The periwound skin is in good condition with just a little induration that appears to be secondary to inflammation. 01/31/2022: The wound measures a little bit larger today, but overall is quite a bit cleaner. There is some undermining from 9:00 to 3:00. She is having some periwound itching and says that her wrap slid. 02/08/2022: Despite using an Unna boot first layer at the top of her wrap, it slid again and it looks like there is been some bruising at the wound site. The wound is about the same size in terms of dimensions. There is a fair amount of slough and nonviable tissue still present. Edema control is better than last week. 02/15/2022: The wound dimensions are about the same. There is less nonviable tissue present. The  periwound erythema has improved. 02/22/2022: The wound has deteriorated over the past week. It is larger and the periwound is more edematous, erythematous, and indurated. It looks as though there has been more tissue breakdown with undermining present. She is having more pain. There is a foul odor coming from the wound. 02/26/2022: The wound looks quite a bit better today and the odor is gone. I still do not have her culture data back, but she seems to be responding well to the Augmentin. 03/06/2022: Her wound continues to improve. There is still some slough accumulation, but the periwound skin is less inflamed. Her culture returned with a polymicrobial population including Pseudomonas and so levofloxacin was also prescribed. She did not understand why a second antibiotic was being added so she has not yet initiated this. 03/14/2022: The wound is about the same, to perhaps a little bit larger. There is a fair amount of slough accumulation on the surface, as well as some hypertrophic granulation tissue. She is still taking levofloxacin, but has completed taking Augmentin. She has her Jodie Echevaria compound with her today. 12/14; the patient has a significant circular wound on the posterior right calf. She is using Keystone and silver alginate under 3 layer compression. Her ABIs were within normal limits at 0.97. This may have been traumatic or an insect bite at the start I reviewed these records. 04/04/2022: Since I last saw the wound, it has contracted considerably. There is a fairly thick layer of slough on the surface, as it has not been debrided since the last time I did it. Edema control is excellent and the periwound skin is in much better condition. 04/12/2022:  No significant change in the wound dimensions. It is filling with granulation tissue. Still with slough accumulation on the surface. 04/19/2022: The wound is smaller this week. There is some slough accumulation on the surface. 04/26/2022: The  wound is smaller again this week and significantly cleaner. The wound surface is a little bit drier than ideal. 05/03/2022: The wound measurements were about the same, but visually it appears smaller. There is still some undermining at the top of the wound. Moisture balance is better this week. 05/10/2022: The wound measured smaller today. There is still a fair amount of undermining present. Slough has built up on the surface. We are still awaiting snap VAC approval. 05/17/2022: The wound is a little bit smaller today. There is less slough on the surface, but the granulation tissue still is not very robust. She has been approved for snap VAC but will have a 20% coinsurance and it is not clear what that amount would end up being for her. 05/27/2022: The surface of the wound has deteriorated and it is gray and fibrotic. No significant odor, but the drainage on her dressing was a little bit purulent. 05/31/2022: The wound looks quite a bit better today. It is still a little fibrotic but no longer has purulent-looking drainage. The color is better. She spoke with her insurance company and is interested in trying the snap VAC, now that she is aware of the cost to her. 06/07/2022: After 1 week in the snap VAC, there has been substantial improvement to her wound. The undermined portion is closing in. The surface has a healthier color and appearance. There is very minimal slough accumulation. 06/14/2022: The more shallow part of the undermined portion of her wound has closed. There is still some undermining from about 1-2 o'clock. The surface continues to improve. Minimal slough accumulation. [name], [name] ([id number]) [id number] Page 8 of 12 06/28/2022: The wound is shallower this week and the undermining has essentially closed and completely. The surface tissue is improving. 07/05/2022: The depth is almost immeasurable and the surface is nearly flush with the surrounding skin. The  undermining has been eliminated. There is light slough on the wound surface. 07/12/2022: There is a little bit of slough on the wound surface. The depth is about the same as last week. 07/19/2022: The wound is essentially flush with the surrounding skin surface. There is slight slough present. No real change in the AP or transverse dimensions. 07/26/2022: The wound measured a little bit smaller today. It is flush with the surrounding skin surface and there is good granulation tissue present. Minimal slough and biofilm accumulation. 08/02/2022: The right posterior leg wound is a little bit smaller. There is a little slough accumulation. She reported a new wound to [place] today. It is on her left posterior calf. It has been present for about 6 weeks. She is not sure of how it occurred. She has been trying to manage it at home with topical Neosporin and Band-Aids. The fat layer is exposed. The surface is fibrotic and covered with slough. Patient History Information obtained from Patient. Family History Cancer - Mother, Diabetes - Father, Hypertension - Mother, Kidney Disease - Mother, Lung Disease - Father, Thyroid Problems - Paternal Grandparents, No family history of Heart Disease, Seizures, Stroke, Tuberculosis. Social History Never smoker, Marital Status - Married, Alcohol Use - Never, Drug Use - No History, Caffeine Use - Daily - coffee. Medical History Eyes Denies history of Cataracts, Glaucoma, Optic Neuritis Ear/Nose/Mouth/Throat Denies history of Chronic sinus problems/congestion,  Middle ear problems Hematologic/Lymphatic Patient has history of Anemia - iron Denies history of Hemophilia, Human Immunodeficiency Virus, Lymphedema, Sickle Cell Disease Respiratory Patient has history of Sleep Apnea - CPAP Denies history of Aspiration, Asthma, Chronic Obstructive Pulmonary Disease (COPD), Pneumothorax, Tuberculosis Cardiovascular Patient has history of Hypertension, Peripheral Venous  Disease Denies history of Angina, Arrhythmia, Congestive Heart Failure, Coronary Artery Disease, Deep Vein Thrombosis, Hypotension, Myocardial Infarction, Peripheral Arterial Disease, Phlebitis, Vasculitis Gastrointestinal Denies history of Cirrhosis , Colitis, Crohnoos, Hepatitis A, Hepatitis B, Hepatitis C Endocrine Denies history of Type I Diabetes, Type II Diabetes Genitourinary Denies history of End Stage Renal Disease Immunological Denies history of Lupus Erythematosus, Raynaudoos, Scleroderma Integumentary (Skin) Denies history of History of Burn Musculoskeletal Denies history of Gout, Rheumatoid Arthritis, Osteoarthritis, Osteomyelitis Neurologic Denies history of Dementia, Neuropathy, Quadriplegia, Paraplegia, Seizure Disorder Oncologic Denies history of Received Chemotherapy, Received Radiation Psychiatric Denies history of Anorexia/bulimia, Confinement Anxiety Hospitalization/Surgery History - cholecystectomy 1980s. - nephrolithasis 1980s. - plate and rod in right elbow surgery 2010. 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 no acute distress. Vitals Time Taken: 2:58 AM, Height: 61 in, Weight: 277 lbs, BMI: 52.3, Temperature: 98.3 F, Pulse: 107 bpm, Respiratory Rate: 20 breaths/min, Blood Pressure: 118/72 mmHg. Respiratory Normal work of breathing on room air. [name], [name] ([id number]) [id number] Page 9 of 12 General Notes: 08/02/2022: The right posterior leg wound is a little bit smaller. There is a little slough accumulation. She reported a new wound to [place] today. It is on her left posterior calf. The fat layer is exposed. The surface is fibrotic and covered with slough. Integumentary (Hair, Skin) Wound #2 status is Open. Original cause of wound was Insect Bite. The date acquired was: 11/21/2021. The wound  has been in treatment 27 weeks. The wound is located on the Right,Posterior Lower Leg. The wound measures 2.3cm length x 2cm width x 0.1cm depth; 3.613cm^2 area and 0.361cm^3 volume. There is Fat Layer (Subcutaneous Tissue) exposed. There is no tunneling or undermining noted. There is a medium amount of serosanguineous drainage noted. The wound margin is distinct with the outline attached to the wound base. There is medium (34-66%) red, pink granulation within the wound bed. There is a medium (34-66%) amount of necrotic tissue within the wound bed including Adherent Slough. The periwound skin appearance had no abnormalities noted for texture. The periwound skin appearance had no abnormalities noted for moisture. The periwound skin appearance exhibited: Hemosiderin Staining. The periwound skin appearance did not exhibit: Atrophie Blanche, Ecchymosis, Rubor, Erythema. Periwound temperature was noted as No Abnormality. Wound #3 status is Open. Original cause of wound was Not Known. The date acquired was: 06/28/2022. The wound is located on the Left,Posterior Lower Leg. The wound measures 1.3cm length x 1.3cm width x 0.1cm depth; 1.327cm^2 area and 0.133cm^3 volume. There is Fat Layer (Subcutaneous Tissue) exposed. There is no tunneling or undermining noted. There is a small amount of serous drainage noted. The wound margin is flat and intact. There is no granulation within the wound bed. There is a large (67-100%) amount of necrotic tissue within the wound bed including Adherent Slough. The periwound skin appearance had no abnormalities noted for moisture. The periwound skin appearance had no abnormalities noted for color. The periwound skin appearance exhibited: Rash. Periwound temperature was noted as No Abnormality. The periwound has tenderness on palpation. 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 There was a Excisional Skin/Subcutaneous Tissue Debridement with a total area of 3.61 sq cm performed by [name] Guess, MD. With the following instrument(s): Curette to remove Viable and Non-Viable tissue/material. Material removed includes Subcutaneous Tissue and Slough and after achieving pain control using Lidocaine 4% Topical Solution. No specimens were taken. A time out was conducted at 15:35, prior to the start of the procedure. A Minimum amount of bleeding was controlled with Pressure. The procedure was tolerated well with a pain level of 3 throughout and a pain level of 2 following the procedure. Post Debridement Measurements: 2.3cm length x 2cm width x 0.1cm depth; 0.361cm^3 volume. Character of Wound/Ulcer Post Debridement is improved. Severity of Tissue Post Debridement is: Fat layer exposed. Post procedure Diagnosis Wound #2: Same as Pre-Procedure General Notes: scribed for Dr. Lady Gary by [name] Deed, RN. Pre-procedure diagnosis of Wound #2 is a Venous Leg Ulcer located on the Right,Posterior Lower Leg . There was a Double Layer Compression Therapy Procedure by [name] Deed, RN. Post procedure Diagnosis Wound #2: Same as Pre-Procedure Notes: urgo lite. Wound #3 Pre-procedure diagnosis of Wound #3 is a Venous Leg Ulcer located on the Left,Posterior Lower Leg .Severity of Tissue Pre Debridement is: Fat layer exposed. There was a Excisional Skin/Subcutaneous Tissue Debridement with a total area of 1.33 sq cm performed by [name] Guess, MD. With the following instrument(s): Curette to remove Viable and Non-Viable tissue/material. Material removed includes Subcutaneous Tissue and Slough and  after achieving pain control using Lidocaine 4% Topical Solution. No specimens were taken. A time out was conducted at 15:35, prior to the start of the procedure. A Minimum amount of bleeding was controlled with Pressure. The procedure was tolerated well with a pain level of 3 throughout and a pain level of 2 following the procedure. Post Debridement Measurements: 1.3cm length x 1.3cm width x 0.1cm depth; 0.133cm^3 volume. Character of Wound/Ulcer Post Debridement is improved. Severity of Tissue Post Debridement is: Fat layer exposed. Post procedure Diagnosis Wound #3: Same as Pre-Procedure General Notes: scribed for Dr. Lady Gary by [name] Deed, RN. Pre-procedure diagnosis of Wound #3 is a Venous Leg Ulcer located on the Left,Posterior Lower Leg . There was a Double Layer Compression Therapy Procedure by [name] Deed, RN. Post procedure Diagnosis Wound #3: Same as Pre-Procedure Notes: urgo lite. Plan Follow-up Appointments: Return Appointment in 1 week. - Dr. Lady Gary RM 1 Friday 5/1@ 1:15 pm Anesthetic: (In clinic) Topical Lidocaine 4% applied to wound bed Bathing/ Shower/ Hygiene: May shower with protection but do not get wound dressing(s) wet. Protect dressing(s) with water repellant cover (for example, large plastic bag) or a cast cover and may then take shower. Edema Control - Lymphedema / SCD / OtherMELLISSA, [name] ([id number]) [id number] Page 10 of 12 Elevate legs to the level of the heart or above for 30 minutes daily and/or when sitting for 3-4 times a day throughout the day. Avoid standing for long periods of time. Exercise regularly Moisturize legs daily. WOUND #2: - Lower Leg Wound Laterality: Right, Posterior Cleanser: Soap and Water 1 x Per Week/30 Days Discharge Instructions: May shower and wash wound with dial antibacterial soap and water prior to dressing change. Cleanser: Wound  Cleanser 1 x Per Week/30 Days Discharge Instructions:  Cleanse the wound with wound cleanser prior to applying a clean dressing using gauze sponges, not tissue or cotton balls. Peri-Wound Care: Sween Lotion (Moisturizing lotion) 1 x Per Week/30 Days Discharge Instructions: Apply moisturizing lotion as directed Prim Dressing: Maxorb Extra Ag+ Alginate Dressing, 2x2 (in/in) 1 x Per Week/30 Days ary Discharge Instructions: Apply to wound bed as instructed Secondary Dressing: Woven Gauze Sponge, Non-Sterile 4x4 in 1 x Per Week/30 Days Discharge Instructions: Apply over primary dressing as directed. Com pression Wrap: Urgo K2 Lite, two layer compression system, regular 1 x Per Week/30 Days Discharge Instructions: Apply Urgo K2 Lite as directed (alternative to 3 layer compression). WOUND #3: - Lower Leg Wound Laterality: Left, Posterior Cleanser: Soap and Water 1 x Per Week/30 Days Discharge Instructions: May shower and wash wound with dial antibacterial soap and water prior to dressing change. Cleanser: Wound Cleanser 1 x Per Week/30 Days Discharge Instructions: Cleanse the wound with wound cleanser prior to applying a clean dressing using gauze sponges, not tissue or cotton balls. Peri-Wound Care: Sween Lotion (Moisturizing lotion) 1 x Per Week/30 Days Discharge Instructions: Apply moisturizing lotion as directed Prim Dressing: Maxorb Extra Ag+ Alginate Dressing, 2x2 (in/in) 1 x Per Week/30 Days ary Discharge Instructions: Apply to wound bed as instructed Secondary Dressing: Woven Gauze Sponge, Non-Sterile 4x4 in 1 x Per Week/30 Days Discharge Instructions: Apply over primary dressing as directed. Com pression Wrap: Urgo K2 Lite, two layer compression system, regular 1 x Per Week/30 Days Discharge Instructions: Apply Urgo K2 Lite as directed (alternative to 3 layer compression). 08/02/2022: The right posterior leg wound is a little bit smaller. There is a little slough accumulation. She reported a new wound to [place] today. It is on her left posterior  calf. It has been present for about 6 weeks. She is not sure of how it occurred. She has been trying to manage it at home with topical Neosporin and Band-Aids. The fat layer is exposed. The surface is fibrotic and covered with slough. I used a curette to debride slough and subcutaneous tissue from both of the wounds. I am going to change the contact layer on the right to silver alginate and also use this on the new wound on the left. We will continue 3 layer compression or equivalent bilaterally. Follow-up in 1 week. Electronic Signature(s) Signed: 08/02/2022 4:15:34 PM By: [name] Guess MD FACS Entered By: [name] on 08/02/2022 16:15:34 -------------------------------------------------------------------------------- HxROS Details Patient Name: Date of Service: [name], [name] [name] H. 08/02/2022 2:45 PM Medical Record Number: [id number] Patient Account Number: [id number] Date of Birth/Sex: Treating RN: [birth year] (62 y.o. F) Primary Care Provider: Nira [name] Other Clinician: Referring Provider: Treating Provider/Extender: [name] in Treatment: 27 Information Obtained From Patient Eyes Medical History: Negative for: Cataracts; Glaucoma; Optic Neuritis Ear/Nose/Mouth/Throat Medical History: Negative for: Chronic sinus problems/congestion; Middle ear problems Hematologic/Lymphatic Medical History: Positive for: Anemia - iron Negative for: Hemophilia; Human Immunodeficiency Virus; Lymphedema; Sickle Cell Disease Respiratory Medical History: Positive for: Sleep Apnea - CPAP Negative for: Aspiration; Asthma; Chronic Obstructive Pulmonary Disease (COPD); Pneumothorax; Tuberculosis TASHYA, ALBERTY ([id number]) [id number] Page 11 of 12 Cardiovascular Medical History: Positive for: Hypertension; Peripheral Venous Disease Negative for: Angina; Arrhythmia; Congestive Heart Failure; Coronary Artery Disease; Deep Vein  Thrombosis; Hypotension; Myocardial Infarction; Peripheral Arterial Disease; Phlebitis; Vasculitis Gastrointestinal Medical History: Negative for: Cirrhosis ; Colitis; Crohns; Hepatitis A; Hepatitis B; Hepatitis C Endocrine Medical History: Negative for: Type I Diabetes;  Type II Diabetes Genitourinary Medical History: Negative for: End Stage Renal Disease Past Medical History Notes: years ago kidney stones Immunological Medical History: Negative for: Lupus Erythematosus; Raynauds; Scleroderma Integumentary (Skin) Medical History: Negative for: History of Burn Musculoskeletal Medical History: Negative for: Gout; Rheumatoid Arthritis; Osteoarthritis; Osteomyelitis Neurologic Medical History: Negative for: Dementia; Neuropathy; Quadriplegia; Paraplegia; Seizure Disorder Oncologic Medical History: Negative for: Received Chemotherapy; Received Radiation Past Medical History Notes: skin Ca removed from back years ago Psychiatric Medical History: Negative for: Anorexia/bulimia; Confinement Anxiety Immunizations Pneumococcal Vaccine: Received Pneumococcal Vaccination: No Implantable Devices None Hospitalization / Surgery History Type of Hospitalization/Surgery cholecystectomy 1980s nephrolithasis 1980s plate and rod in right elbow surgery 2010 Family and Social History Cancer: Yes - Mother; Diabetes: Yes - Father; Heart Disease: No; Hypertension: Yes - Mother; Kidney Disease: Yes - Mother; Lung Disease: Yes - Father; Seizures: No; Stroke: No; Thyroid Problems: Yes - Paternal Grandparents; Tuberculosis: No; Never smoker; Marital Status - Married; Alcohol Use: Never; Drug Use: No History; Caffeine Use: Daily - coffee; Financial Concerns: No; Food, Clothing or Shelter Needs: No; Support System Lacking: No; Transportation Concerns: No [name], [name] ([id number]) [id number] Page 12 of 12 Electronic Signature(s) Signed: 08/02/2022 4:46:59 PM By:  [name] Guess MD FACS Entered By: [name] on 08/02/2022 16:06:25 -------------------------------------------------------------------------------- SuperBill Details Patient Name: Date of Service: [address] [name] [name] H. 08/02/2022 Medical Record Number: [id number] Patient Account Number: [id number] Date of Birth/Sex: Treating RN: 1961/03/07 (62 y.o. 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 Modifier: Quantity: 1 Physician Procedures : CPT4 Code Description Modifier [id number] 99214 - WC PHYS LEVEL 4 - EST PT 25 ICD-10 Diagnosis Description 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

## 2022-08-09 ENCOUNTER — Encounter (HOSPITAL_BASED_OUTPATIENT_CLINIC_OR_DEPARTMENT_OTHER): Payer: BC Managed Care – PPO | Attending: General Surgery | Admitting: General Surgery

## 2022-08-09 DIAGNOSIS — L97212 Non-pressure chronic ulcer of right calf with fat layer exposed: Secondary | ICD-10-CM | POA: Diagnosis present

## 2022-08-09 DIAGNOSIS — I872 Venous insufficiency (chronic) (peripheral): Secondary | ICD-10-CM | POA: Insufficient documentation

## 2022-08-09 DIAGNOSIS — L97222 Non-pressure chronic ulcer of left calf with fat layer exposed: Secondary | ICD-10-CM | POA: Insufficient documentation

## 2022-08-09 DIAGNOSIS — Z6841 Body Mass Index (BMI) 40.0 and over, adult: Secondary | ICD-10-CM | POA: Diagnosis not present

## 2022-08-09 DIAGNOSIS — I10 Essential (primary) hypertension: Secondary | ICD-10-CM | POA: Insufficient documentation

## 2022-08-09 DIAGNOSIS — R600 Localized edema: Secondary | ICD-10-CM | POA: Insufficient documentation

## 2022-08-10 NOTE — Progress Notes (Addendum)
[name], [name] ([id number]) [id number] Page 1 of 9 Visit Report for 08/09/2022 Arrival Information Details Patient Name: Date of Service: [place], [name] 08/09/2022 1:15 PM Medical Record Number: [id number] Patient Account Number: [id number] Date of Birth/Sex: Treating RN: 1960/07/13 (62 y.o. F) Primary Care Sennie Borden: Nira Conn Other Clinician: Referring Finn Amos: Treating Geraldyn Shain/Extender: Andrey Campanile in Treatment: 28 Visit Information History Since Last Visit All ordered tests and consults were completed: No Patient Arrived: Ambulatory Added or deleted any medications: No Arrival Time: 13:19 Any new allergies or adverse reactions: No Accompanied By: self Had a fall or experienced change in No Transfer Assistance: None activities of daily living that may affect Patient Identification Verified: Yes risk of falls: Secondary Verification Process Completed: Yes Signs or symptoms of abuse/neglect since last visito No Patient Requires Transmission-Based Precautions: No Hospitalized since last visit: No Patient Has Alerts: No Implantable device outside of the clinic excluding No cellular tissue based products placed in the center since last visit: Has Dressing in Place as Prescribed: Yes Has Compression in Place as Prescribed: Yes Pain Present Now: No Electronic Signature(s) Signed: 08/09/2022 2:54:46 PM By: [name] RN, BSN Entered By: [name] on 08/09/2022 13:39:17 -------------------------------------------------------------------------------- Compression Therapy Details Patient Name: Date of Service: [name], [name] 08/09/2022 1:15 PM Medical Record Number: [id number] Patient Account Number: [id number] Date of Birth/Sex: Treating RN: 06/30/1960 (62 y.o. [name] Primary Care Macarena Langseth: Nira Conn Other Clinician: Referring Garrell Flagg: Treating Yasmine Kilbourne/Extender: Andrey Campanile in Treatment: 28 Compression Therapy Performed for Wound Assessment: Wound #2 Right,Posterior Lower Leg Performed By: Clinician [name], RN Compression Type: Double Layer Post Procedure Diagnosis Same as Pre-procedure Notes Stephannie Li Electronic Signature(s) Signed: 08/09/2022 2:54:46 PM By: [name] RN, BSN Entered By: [name] on 08/09/2022 13:47:59 -------------------------------------------------------------------------------- Compression Therapy Details Patient Name: Date of Service: [name], [name] 08/09/2022 1:15 PM Medical Record Number: [id number] Patient Account Number: [id number] Date of Birth/Sex: Treating RN: 11-15-1960 (62 y.o. [name] Primary Care Gladies Sofranko: Nira Conn Other Clinician: Referring William Schake: Treating Mazzie Brodrick/Extender: Andrey Campanile in Treatment: 28 Compression Therapy Performed for Wound Assessment: Wound #3 Left,Posterior Lower Leg Performed By: Serafina Royals, RN [address], Romie Minus ([id number]) [id number] Page 2 of 9 Compression Type: Double Layer Post Procedure Diagnosis Same as Pre-procedure Notes urgo Production assistant, radio) Signed: 08/09/2022 2:54:46 PM By: [name] RN, BSN Entered By: [name] on 08/09/2022 13:47:59 -------------------------------------------------------------------------------- Encounter Discharge Information Details Patient Name: Date of Service: [name] [name] 08/09/2022 1:15 PM Medical Record Number: [id number] Patient Account Number: [id number] Date of Birth/Sex: Treating RN: 1960/09/18 (62 y.o. [name] Primary Care Ingris Pasquarella: Nira Conn Other Clinician: Referring Asyria Kolander: Treating Vearl Aitken/Extender: Andrey Campanile in Treatment: 28 Encounter Discharge Information Items Post Procedure Vitals Discharge Condition:  Stable Temperature (F): 97.5 Ambulatory Status: Ambulatory Pulse (bpm): 84 Discharge Destination: Home Respiratory Rate (breaths/min): 20 Transportation: Private Auto Blood Pressure (mmHg): 135/72 Accompanied By: self Schedule Follow-up Appointment: Yes Clinical Summary of Care: Patient Declined Electronic Signature(s) Signed: 08/09/2022 2:54:46 PM By: [name] RN, BSN Entered By: [name] on 08/09/2022 14:22:18 -------------------------------------------------------------------------------- Lower Extremity Assessment Details Patient Name: Date of Service: [name], [name] 08/09/2022 1:15 PM Medical Record Number: [id number] Patient Account Number: [id number] Date of Birth/Sex: Treating RN: [birth year] (62 y.o. [name] Primary Care Sondra Blixt: Nira Conn Other Clinician: Referring Khyran Riera: Treating Victormanuel Mclure/Extender: Andrey Campanile in  Treatment: 28 Edema Assessment Assessed: [Left: No] [Right: No] Edema: [Left: Yes] [Right: Yes] Calf Left: Right: Point of Measurement: From Medial Instep 41 cm 38 cm Ankle Left: Right: Point of Measurement: From Medial Instep 23.5 cm 24 cm Electronic Signature(s) Signed: 08/09/2022 2:54:46 PM By: [name] RN, BSN Entered By: [name] on 08/09/2022 13:40:27 Multi Wound Chart Details -------------------------------------------------------------------------------- [name] ([id number]) [id number] Page 3 of 9 Patient Name: Date of Service: [name] 08/09/2022 1:15 PM Medical Record Number: [id number] Patient Account Number: [id number] Date of Birth/Sex: Treating RN: May 07, 1960 (62 y.o. F) Primary Care Yarenis Cerino: Nira Conn Other Clinician: Referring Dorrien Grunder: Treating Adalay Azucena/Extender: Andrey Campanile in Treatment: 28 Vital Signs Height(in): 61 Pulse(bpm): 84 Weight(lbs): 277 Blood Pressure(mmHg):  135/72 Body Mass Index(BMI): 52.3 Temperature(F): 97.5 Respiratory Rate(breaths/min): 20 Wound Assessments Wound Number: 2 3 N/A Photos: No Photos No Photos N/A Right, Posterior Lower Leg Left, Posterior Lower Leg N/A Wound Location: Insect Bite Trauma N/A Wounding Event: Venous Leg Ulcer Venous Leg Ulcer N/A Primary Etiology: Anemia, Sleep Apnea, Hypertension, Anemia, Sleep Apnea, Hypertension, N/A Comorbid History: Peripheral Venous Disease Peripheral Venous Disease 11/21/2021 06/28/2022 N/A Date Acquired: 28 1 N/A Weeks of Treatment: Open Open N/A Wound Status: No No N/A Wound Recurrence: 2.1x1.8x0.1 1.3x1x0.1 N/A Measurements L x W x D (cm) 2.969 1.021 N/A A (cm) : rea 0.297 0.102 N/A Volume (cm) : -799.70% 23.10% N/A % Reduction in A rea: -200.00% 23.30% N/A % Reduction in Volume: Full Thickness Without Exposed Full Thickness Without Exposed N/A Classification: Support Structures Support Structures Medium Medium N/A Exudate A mount: Serosanguineous Serous N/A Exudate Type: red, brown amber N/A Exudate Color: Flat and Intact Flat and Intact N/A Wound Margin: Large (67-100%) Small (1-33%) N/A Granulation A mount: Red, Hyper-granulation Pink N/A Granulation Quality: Small (1-33%) Large (67-100%) N/A Necrotic A mount: Fat Layer (Subcutaneous Tissue): Yes Fat Layer (Subcutaneous Tissue): Yes N/A Exposed Structures: Fascia: No Fascia: No Tendon: No Tendon: No Muscle: No Muscle: No Joint: No Joint: No Bone: No Bone: No Small (1-33%) None N/A Epithelialization: Debridement - Selective/Open Wound Debridement - Excisional N/A Debridement: Pre-procedure Verification/Time Out 14:00 14:00 N/A Taken: Lidocaine 4% Topical Solution Lidocaine 4% Topical Solution N/A Pain Control: Slough Subcutaneous, Slough N/A Tissue Debrided: Non-Viable Tissue Skin/Subcutaneous Tissue N/A Level: 2.97 1.02 N/A Debridement A (sq cm): rea Curette Curette  N/A Instrument: Minimum Minimum N/A Bleeding: Pressure Pressure N/A Hemostasis A chieved: 0 0 N/A Procedural Pain: 0 0 N/A Post Procedural Pain: Procedure was tolerated well Procedure was tolerated well N/A Debridement Treatment Response: 2.1x1.8x0.1 1.3x1x0.1 N/A Post Debridement Measurements L x W x D (cm) 0.297 0.102 N/A Post Debridement Volume: (cm) Excoriation: No Rash: Yes N/A Periwound Skin Texture: Scarring: No No Abnormalities Noted No Abnormalities Noted N/A Periwound Skin Moisture: Hemosiderin Staining: Yes Erythema: Yes N/A Periwound Skin Color: Atrophie Blanche: No Ecchymosis: No Erythema: No Rubor: No N/A Measured: 1.5cm N/A Erythema Measurement: No Abnormality No Abnormality N/A Temperature: Yes Yes N/A Tenderness on Palpation: Compression Therapy Compression Therapy N/A Procedures Performed: Debridement Debridement Treatment Notes Wound #2 (Lower Leg) Wound Laterality: Right, Posterior ITA, HOLTAN ([id number]) [id number] Page 4 of 9 Cleanser Soap and Water Discharge Instruction: May shower and wash wound with dial antibacterial soap and water prior to dressing change. Wound Cleanser Discharge Instruction: Cleanse the wound with wound cleanser prior to applying a clean dressing using gauze sponges, not tissue or cotton balls. Peri-Wound Care Sween Lotion (Moisturizing lotion) Discharge Instruction: Apply moisturizing  lotion as directed Topical Primary Dressing Secondary Dressing Woven Gauze Sponge, Non-Sterile 4x4 in Discharge Instruction: Apply over primary dressing as directed. Secured With Compression Wrap Urgo K2 Lite, two layer compression system, regular Discharge Instruction: Apply Urgo K2 Lite as directed (alternative to 3 layer compression). Compression Stockings Add-Ons Wound #3 (Lower Leg) Wound Laterality: Left, Posterior Cleanser Soap and Water Discharge Instruction: May shower and wash wound  with dial antibacterial soap and water prior to dressing change. Wound Cleanser Discharge Instruction: Cleanse the wound with wound cleanser prior to applying a clean dressing using gauze sponges, not tissue or cotton balls. Peri-Wound Care Sween Lotion (Moisturizing lotion) Discharge Instruction: Apply moisturizing lotion as directed Topical Primary Dressing Maxorb Extra Ag+ Alginate Dressing, 2x2 (in/in) Discharge Instruction: Apply to wound bed as instructed Secondary Dressing Woven Gauze Sponge, Non-Sterile 4x4 in Discharge Instruction: Apply over primary dressing as directed. Secured With Compression Wrap Urgo K2 Lite, two layer compression system, regular Discharge Instruction: Apply Urgo K2 Lite as directed (alternative to 3 layer compression). Compression Stockings Add-Ons Electronic Signature(s) Signed: 08/09/2022 4:11:21 PM By: [name] MD FACS Entered By: [name] on 08/09/2022 16:11:21 -------------------------------------------------------------------------------- Multi-Disciplinary Care Plan Details Patient Name: Date of Service: [address], [name] 08/09/2022 1:15 PM Medical Record Number: [id number] Patient Account Number: [id number] Date of Birth/Sex: Treating RN: February 07, 1961 (62 y.o. [name] Primary Care Khalea Ventura: Nira Conn Other Clinician: Referring Naythen Heikkila: Treating Monique Hefty/Extender: Heinz Knuckles [place], Romie Minus ([id number]) [phone].pdf Page 5 of 9 Weeks in Treatment: 28 Multidisciplinary Care Plan reviewed with physician Active Inactive Necrotic Tissue Nursing Diagnoses: Impaired tissue integrity related to necrotic/devitalized tissue Knowledge deficit related to management of necrotic/devitalized tissue Goals: Necrotic/devitalized tissue will be minimized in the wound bed Date Initiated: 01/23/2022 Target Resolution Date: 08/30/2022 Goal Status: Active Patient/caregiver  will verbalize understanding of reason and process for debridement of necrotic tissue Date Initiated: 01/23/2022 Target Resolution Date: 08/30/2022 Goal Status: Active Interventions: Assess patient pain level pre-, during and post procedure and prior to discharge Provide education on necrotic tissue and debridement process Treatment Activities: Apply topical anesthetic as ordered : 01/23/2022 Notes: Venous Leg Ulcer Nursing Diagnoses: Knowledge deficit related to disease process and management Potential for venous Insuffiency (use before diagnosis confirmed) Goals: Patient will maintain optimal edema control Date Initiated: 07/05/2022 Target Resolution Date: 08/30/2022 Goal Status: Active Interventions: Assess peripheral edema status every visit. Compression as ordered Provide education on venous insufficiency Treatment Activities: Therapeutic compression applied : 07/05/2022 Notes: Wound/Skin Impairment Nursing Diagnoses: Impaired tissue integrity Knowledge deficit related to ulceration/compromised skin integrity Goals: Patient/caregiver will verbalize understanding of skin care regimen Date Initiated: 01/23/2022 Target Resolution Date: 08/30/2022 Goal Status: Active Interventions: Assess patient/caregiver ability to obtain necessary supplies Assess ulceration(s) every visit Treatment Activities: Skin care regimen initiated : 01/23/2022 Topical wound management initiated : 01/23/2022 Notes: Electronic Signature(s) Signed: 08/09/2022 2:54:46 PM By: [name] RN, BSN Entered By: [name] on 08/09/2022 13:47:01 [name] ([id number]) [id number] Page 6 of 9 -------------------------------------------------------------------------------- Pain Assessment Details Patient Name: Date of Service: [name] 08/09/2022 1:15 PM Medical Record Number: [id number] Patient Account Number: [id number] Date of Birth/Sex: Treating  RN: 20-Feb-1961 (62 y.o. F) Primary Care Sandip Power: Nira Conn Other Clinician: Referring Izayah Miner: Treating Etan Vasudevan/Extender: Andrey Campanile in Treatment: 28 Active Problems Location of Pain Severity and Description of Pain Patient Has Paino No Site Locations Rate the pain. Current Pain Level: 0 Pain Management and Medication Current Pain Management: Electronic Signature(s) Signed:  08/09/2022 2:54:46 PM By: [name] RN, BSN Entered By: [name] on 08/09/2022 13:39:03 -------------------------------------------------------------------------------- Patient/Caregiver Education Details Patient Name: Date of Service: [name], [name] DA Rexene Edison 5/3/2024andnbsp1:15 PM Medical Record Number: [id number] Patient Account Number: [id number] Date of Birth/Gender: Treating RN: 06/05/1960 (62 y.o. [name] Primary Care Physician: Nira Conn Other Clinician: Referring Physician: Treating Physician/Extender: Andrey Campanile in Treatment: 28 Education Assessment Education Provided To: Patient Education Topics Provided Venous: Methods: Explain/Verbal Responses: Reinforcements needed, State content correctly Electronic Signature(s) Signed: 08/09/2022 2:54:46 PM By: [name] RN, BSN Entered By: [name] on 08/09/2022 13:47:19 [name] ([id number]) [id number] Page 7 of 9 -------------------------------------------------------------------------------- Wound Assessment Details Patient Name: Date of Service: [name] 08/09/2022 1:15 PM Medical Record Number: [id number] Patient Account Number: [id number] Date of Birth/Sex: Treating RN: 1960-07-28 (62 y.o. [name] Primary Care Izzy Doubek: Nira Conn Other Clinician: Referring Linlee Cromie: Treating Arjay Jaskiewicz/Extender: Andrey Campanile in Treatment: 28 Wound Status Wound Number: 2  Primary Venous Leg Ulcer Etiology: Wound Location: Right, Posterior Lower Leg Wound Status: Open Wounding Event: Insect Bite Comorbid Anemia, Sleep Apnea, Hypertension, Peripheral Venous Date Acquired: 11/21/2021 History: Disease Weeks Of Treatment: 28 Clustered Wound: No Wound Measurements Length: (cm) 2.1 Width: (cm) 1.8 Depth: (cm) 0.1 Area: (cm) 2.969 Volume: (cm) 0.297 % Reduction in Area: -799.7% % Reduction in Volume: -200% Epithelialization: Small (1-33%) Tunneling: No Undermining: No Wound Description Classification: Full Thickness Without Exposed Support Structures Wound Margin: Flat and Intact Exudate Amount: Medium Exudate Type: Serosanguineous Exudate Color: red, brown Foul Odor After Cleansing: No Slough/Fibrino Yes Wound Bed Granulation Amount: Large (67-100%) Exposed Structure Granulation Quality: Red, Hyper-granulation Fascia Exposed: No Necrotic Amount: Small (1-33%) Fat Layer (Subcutaneous Tissue) Exposed: Yes Necrotic Quality: Adherent Slough Tendon Exposed: No Muscle Exposed: No Joint Exposed: No Bone Exposed: No Periwound Skin Texture Texture Color No Abnormalities Noted: Yes No Abnormalities Noted: No Atrophie Blanche: No Moisture Ecchymosis: No No Abnormalities Noted: Yes Erythema: No Hemosiderin Staining: Yes Rubor: No Temperature / Pain Temperature: No Abnormality Tenderness on Palpation: Yes Treatment Notes Wound #2 (Lower Leg) Wound Laterality: Right, Posterior Cleanser Soap and Water Discharge Instruction: May shower and wash wound with dial antibacterial soap and water prior to dressing change. Wound Cleanser Discharge Instruction: Cleanse the wound with wound cleanser prior to applying a clean dressing using gauze sponges, not tissue or cotton balls. Peri-Wound Care Sween Lotion (Moisturizing lotion) Discharge Instruction: Apply moisturizing lotion as directed Topical Primary Dressing Secondary Dressing Woven Gauze  Sponge, Non-Sterile 4x4 in Discharge Instruction: Apply over primary dressing as directed. TUCKER, BARTLETT ([id number]) [id number] Page 8 of 9 Secured With Compression Wrap Urgo K2 Lite, two layer compression system, regular Discharge Instruction: Apply Urgo K2 Lite as directed (alternative to 3 layer compression). Compression Stockings Add-Ons Electronic Signature(s) Signed: 08/09/2022 2:54:46 PM By: [name] RN, BSN Entered By: [name] on 08/09/2022 13:44:44 -------------------------------------------------------------------------------- Wound Assessment Details Patient Name: Date of Service: [address] [name] 08/09/2022 1:15 PM Medical Record Number: [id number] Patient Account Number: [id number] Date of Birth/Sex: Treating RN: [birth year] (62 y.o. [name] Primary Care Jasai Sorg: Nira Conn Other Clinician: Referring Laporsha Grealish: Treating Quinesha Selinger/Extender: Andrey Campanile in Treatment: 28 Wound Status Wound Number: 3 Primary Venous Leg Ulcer Etiology: Wound Location: Left, Posterior Lower Leg Wound Status: Open Wounding Event: Trauma Comorbid Anemia, Sleep Apnea, Hypertension, Peripheral Venous Date Acquired: 06/28/2022 History: Disease Weeks Of Treatment: 1 Clustered Wound: No Wound Measurements Length: (  cm) 1.3 Width: (cm) 1 Depth: (cm) 0.1 Area: (cm) 1.021 Volume: (cm) 0.102 % Reduction in Area: 23.1% % Reduction in Volume: 23.3% Epithelialization: None Tunneling: No Undermining: No Wound Description Classification: Full Thickness Without Exposed Support Structures Wound Margin: Flat and Intact Exudate Amount: Medium Exudate Type: Serous Exudate Color: amber Foul Odor After Cleansing: No Slough/Fibrino Yes Wound Bed Granulation Amount: Small (1-33%) Exposed Structure Granulation Quality: Pink Fascia Exposed: No Necrotic Amount: Large (67-100%) Fat Layer (Subcutaneous Tissue)  Exposed: Yes Necrotic Quality: Adherent Slough Tendon Exposed: No Muscle Exposed: No Joint Exposed: No Bone Exposed: No Periwound Skin Texture Texture Color No Abnormalities Noted: Yes No Abnormalities Noted: No Erythema: Yes Moisture Erythema Measurement: Measured No Abnormalities Noted: Yes 1.5 cm Temperature / Pain Temperature: No Abnormality Tenderness on Palpation: Yes Treatment Notes Wound #3 (Lower Leg) Wound Laterality: Left, Posterior [name], [name] ([id number]) [phone].pdf Page 9 of 9 Soap and Water Discharge Instruction: May shower and wash wound with dial antibacterial soap and water prior to dressing change. Wound Cleanser Discharge Instruction: Cleanse the wound with wound cleanser prior to applying a clean dressing using gauze sponges, not tissue or cotton balls. Peri-Wound Care Sween Lotion (Moisturizing lotion) Discharge Instruction: Apply moisturizing lotion as directed Topical Primary Dressing Maxorb Extra Ag+ Alginate Dressing, 2x2 (in/in) Discharge Instruction: Apply to wound bed as instructed Secondary Dressing Woven Gauze Sponge, Non-Sterile 4x4 in Discharge Instruction: Apply over primary dressing as directed. Secured With Compression Wrap Urgo K2 Lite, two layer compression system, regular Discharge Instruction: Apply Urgo K2 Lite as directed (alternative to 3 layer compression). Compression Stockings Add-Ons Electronic Signature(s) Signed: 08/09/2022 2:54:46 PM By: [name] RN, BSN Entered By: [name] on 08/09/2022 13:45:34 -------------------------------------------------------------------------------- Vitals Details Patient Name: Date of Service: [address] [name] 08/09/2022 1:15 PM Medical Record Number: [id number] Patient Account Number: [id number] Date of Birth/Sex: Treating RN: 1960/12/16 (62 y.o. F) Primary Care Aven Christen: Nira Conn Other Clinician: Referring  Diedre Maclellan: Treating Kasin Tonkinson/Extender: Andrey Campanile in Treatment: 28 Vital Signs Time Taken: 01:20 Temperature (F): 97.5 Height (in): 61 Pulse (bpm): 84 Weight (lbs): 277 Respiratory Rate (breaths/min): 20 Body Mass Index (BMI): 52.3 Blood Pressure (mmHg): 135/72 Reference Range: 80 - 120 mg / dl Electronic Signature(s) Signed: 08/09/2022 2:37:02 PM By: Dayton Scrape Entered By: Dayton Scrape on 08/09/2022 13:20:24

## 2022-08-10 NOTE — Progress Notes (Signed)
[name], [name] ([id number]) [id number] Page 1 of 12 Visit Report for 08/09/2022 Chief Complaint Document Details Patient Name: Date of Service: [name] 08/09/2022 1:15 PM Medical Record Number: [id number] Patient Account Number: [id number] Date of Birth/Sex: Treating RN: 1961-03-21 (62 y.o. F) Primary Care Provider: Nira Conn Other Clinician: Referring Provider: Treating Provider/Extender: Andrey Campanile in Treatment: 28 Information Obtained from: Patient Chief Complaint RLE ulcer Electronic Signature(s) Signed: 08/09/2022 4:11:38 PM By: [name] MD FACS Entered By: [name] on 08/09/2022 16:11:38 -------------------------------------------------------------------------------- Debridement Details Patient Name: Date of Service: [address], [name] 08/09/2022 1:15 PM Medical Record Number: [id number] Patient Account Number: [id number] Date of Birth/Sex: Treating RN: 1960-04-14 (62 y.o. [name], Bonita Quin Primary Care Provider: Nira Conn Other Clinician: Referring Provider: Treating Provider/Extender: Andrey Campanile in Treatment: 28 Debridement Performed for Assessment: Wound #2 Right,Posterior Lower Leg Performed By: Physician [name], MD Debridement Type: Debridement Severity of Tissue Pre Debridement: Fat layer exposed Level of Consciousness (Pre-procedure): Awake and Alert Pre-procedure Verification/Time Out Yes - 14:00 Taken: Start Time: 14:01 Pain Control: Lidocaine 4% T opical Solution Percent of Wound Bed Debrided: 100% T Area Debrided (cm): otal 2.97 Tissue and other material debrided: Non-Viable, Slough, Biofilm, Slough Level: Non-Viable Tissue Debridement Description: Selective/Open Wound Instrument: Curette Bleeding: Minimum Hemostasis Achieved: Pressure Procedural Pain: 0 Post Procedural Pain: 0 Response to Treatment: Procedure was  tolerated well Level of Consciousness (Post- Awake and Alert procedure): Post Debridement Measurements of Total Wound Length: (cm) 2.1 Width: (cm) 1.8 Depth: (cm) 0.1 Volume: (cm) 0.297 Character of Wound/Ulcer Post Debridement: Improved Severity of Tissue Post Debridement: Fat layer exposed Post Procedure Diagnosis Same as Pre-procedure Notes scribed for Dr. Lady Gary by [name], RN Electronic Signature(s) [name] ([id number]) [id number] Page 2 of 12 Signed: 08/09/2022 2:54:46 PM By: [name] RN, BSN Signed: 08/09/2022 5:53:18 PM By: [name] MD FACS Entered By: [name] on 08/09/2022 14:04:17 -------------------------------------------------------------------------------- Debridement Details Patient Name: Date of Service: [place], [name] 08/09/2022 1:15 PM Medical Record Number: [id number] Patient Account Number: [id number] Date of Birth/Sex: Treating RN: [birth year] (62 y.o. [name], [name] Primary Care Provider: Nira Conn Other Clinician: Referring Provider: Treating Provider/Extender: Andrey Campanile in Treatment: 28 Debridement Performed for Assessment: Wound #3 Left,Posterior Lower Leg Performed By: Physician [name], MD Debridement Type: Debridement Severity of Tissue Pre Debridement: Fat layer exposed Level of Consciousness (Pre-procedure): Awake and Alert Pre-procedure Verification/Time Out Yes - 14:00 Taken: Start Time: 14:01 Pain Control: Lidocaine 4% T opical Solution Percent of Wound Bed Debrided: 100% T Area Debrided (cm): otal 1.02 Tissue and other material debrided: Viable, Non-Viable, Slough, Subcutaneous, Slough Level: Skin/Subcutaneous Tissue Debridement Description: Excisional Instrument: Curette Bleeding: Minimum Hemostasis Achieved: Pressure Procedural Pain: 0 Post Procedural Pain: 0 Response to Treatment: Procedure was tolerated  well Level of Consciousness (Post- Awake and Alert procedure): Post Debridement Measurements of Total Wound Length: (cm) 1.3 Width: (cm) 1 Depth: (cm) 0.1 Volume: (cm) 0.102 Character of Wound/Ulcer Post Debridement: Improved Severity of Tissue Post Debridement: Fat layer exposed Post Procedure Diagnosis Same as Pre-procedure Notes scribed for Dr. Lady Gary by [name], RN Electronic Signature(s) Signed: 08/09/2022 2:54:46 PM By: [name] RN, BSN Signed: 08/09/2022 5:53:18 PM By: [name] MD FACS Entered By: [name] on 08/09/2022 14:04:56 -------------------------------------------------------------------------------- HPI Details Patient Name: Date of Service: [name], [name] 08/09/2022 1:15 PM Medical Record Number: [id number] Patient Account Number: [id number] Date of Birth/Sex:  Treating RN: 10-15-1960 (62 y.o. F) Primary Care Provider: Nira Conn Other Clinician: Referring Provider: Treating Provider/Extender: Andrey Campanile in Treatment: 28 History of Present Illness HPI Description: 02/16/2020 upon evaluation today patient actually appears to be doing poorly in regard to her left medial lower extremity ulcer. This is actually an area that she tells me she has had intermittent issues with over the years although has been closed for some time she typically uses compression right now she has juxta lite compression wraps. With that being said she tells me that this nonetheless open several weeks/months ago and has been given her REALITY, CLENDENIN ([id number]) [phone].pdf Page 3 of 12 trouble since. She does have a history of chronic venous insufficiency she is seeing specialist for this in the past she has had an ablation as well as sclerotherapy. With that being said she also has hypertension chronically which is managed by her primary care provider. In general she seems to be worsening overall  with regard to the wound and states that she finally realized that she needed to come in and have somebody look at this and not continue to try to manage this on her own. No fevers, chills, nausea, vomiting, or diarrhea. 02/23/2020 on evaluation today patient appears to be doing well with regard to her wound. This is showing some signs of improvement which is great news still were not quite at the point where I would like to be as far as the overall appearance of the wound is concerned but I do believe this is better than last week. I do believe the Iodoflex is helping as well. 03/08/2020 upon evaluation today patient appears to be doing well with regard to her wound. She has been tolerating the dressing changes without complication. Fortunately I feel like she has made great progress with the Iodoflex but I feel like it may be the point rest to switch to something else possibly a collagen- based dressing at this time. 03/15/2020 upon evaluation today patient appears to be doing excellent in regard to her leg ulcer. She has been tolerating the dressing changes without complication. Fortunately there is no signs of active infection. Overall she is measuring a little bit smaller today which is great news. 03/22/2020 upon evaluation today patient appears to be doing well with regard to her wound. She has been tolerating the dressing changes without complication. Fortunately there is no signs of active infection at this time. 03/28/2020; patient I do not usually see however she has a wound on the left anterior lower leg secondary to chronic venous insufficiency we have been using silver collagen under compression. She arrives in clinic with a nonviable surface requiring debridement 04/12/2020 upon evaluation today patient appears to be doing well all things considered with regard to her leg ulcer. She is tolerating the dressing changes without complication there is minimal dry skin around the edges of the  wound that may be trapping and stopping some of the events of the new skin I am can work on that today. Otherwise the surface of the wound appears to be doing excellent. 04/19/2020 upon evaluation today patient appears to be doing well with regard to her leg ulcer. She has been tolerating dressing changes without complication. Fortunately there is no signs of active infection at this time. No fever chills noted overall very pleased with how things seem to be progressing. 04/26/2020 on evaluation today patient appears to be doing well with regard to her wound currently. Is showing  signs of excellent improvement overall is filling in nicely and there does not appear to be any signs of infection. No fevers, chills, nausea, vomiting, or diarrhea. 05/03/2020 upon evaluation today patient appears to be doing well with regard to her wound on the leg. This overall showing signs of good improvement which is great she has some good epithelial growth and overall I think that things are moving in the correct direction. We likewise going to continue with the wound care measures as before since she seems to making such good improvement. 2/3; venous wound on the left medial leg. This is contracting. We are using Prisma and 3 layer compression. She has a stocking and waiting in the eventuality this heals. She is already using it on the right 05/17/2020 upon evaluation today patient appears to be doing well with regard to her leg ulcer. She has been tolerating the dressing changes without complication. Fortunately there is no signs of active infection which is great news and overall very pleased with where things stand today. No fevers, chills, nausea, vomiting, or diarrhea. 05/24/2020 upon evaluation today patient appears to be doing well with regard to her wound. Overall I feel like she is making excellent progress. There does not appear to be any signs of active infection which is great news. 05/31/2020 upon evaluation  today patient appears to be doing well with regard to her wound. There does not appear to be any signs of active infection which is great news overall I am extremely pleased with where things stand today. READMISSION 01/23/2022 She returns to clinic today with a new wound on her right posterior calf. She says that she was cleaning out an old shed near the middle of August this year and then noticed what seemed to be a bug bite on her right posterior calf. It was itchy, red, and raised. By the end of September, an ulcer had developed. She has been applying various topical creams such as hydrocortisone and others to the site. When it was not improving, she made an appointment in the wound care center. ABI in clinic today was 0.97. On her right posterior calf, there is a circular wound with necrotic fat and black eschar present. There is no purulent drainage or malodor. The periwound skin is in good condition with just a little induration that appears to be secondary to inflammation. 01/31/2022: The wound measures a little bit larger today, but overall is quite a bit cleaner. There is some undermining from 9:00 to 3:00. She is having some periwound itching and says that her wrap slid. 02/08/2022: Despite using an Unna boot first layer at the top of her wrap, it slid again and it looks like there is been some bruising at the wound site. The wound is about the same size in terms of dimensions. There is a fair amount of slough and nonviable tissue still present. Edema control is better than last week. 02/15/2022: The wound dimensions are about the same. There is less nonviable tissue present. The periwound erythema has improved. 02/22/2022: The wound has deteriorated over the past week. It is larger and the periwound is more edematous, erythematous, and indurated. It looks as though there has been more tissue breakdown with undermining present. She is having more pain. There is a foul odor coming from the  wound. 02/26/2022: The wound looks quite a bit better today and the odor is gone. I still do not have her culture data back, but she seems to be responding well to  the Augmentin. 03/06/2022: Her wound continues to improve. There is still some slough accumulation, but the periwound skin is less inflamed. Her culture returned with a polymicrobial population including Pseudomonas and so levofloxacin was also prescribed. She did not understand why a second antibiotic was being added so she has not yet initiated this. 03/14/2022: The wound is about the same, to perhaps a little bit larger. There is a fair amount of slough accumulation on the surface, as well as some hypertrophic granulation tissue. She is still taking levofloxacin, but has completed taking Augmentin. She has her Jodie Echevaria compound with her today. 12/14; the patient has a significant circular wound on the posterior right calf. She is using Keystone and silver alginate under 3 layer compression. Her ABIs were within normal limits at 0.97. This may have been traumatic or an insect bite at the start I reviewed these records. 04/04/2022: Since I last saw the wound, it has contracted considerably. There is a fairly thick layer of slough on the surface, as it has not been debrided since the last time I did it. Edema control is excellent and the periwound skin is in much better condition. 04/12/2022: No significant change in the wound dimensions. It is filling with granulation tissue. Still with slough accumulation on the surface. 04/19/2022: The wound is smaller this week. There is some slough accumulation on the surface. 04/26/2022: The wound is smaller again this week and significantly cleaner. The wound surface is a little bit drier than ideal. 05/03/2022: The wound measurements were about the same, but visually it appears smaller. There is still some undermining at the top of the wound. Moisture balance is better this week. 05/10/2022: The wound  measured smaller today. There is still a fair amount of undermining present. Slough has built up on the surface. We are still awaiting snap SYRIANA, SANCHEZ ([id number]) [phone].pdf Page 4 of 12 VAC approval. 05/17/2022: The wound is a little bit smaller today. There is less slough on the surface, but the granulation tissue still is not very robust. She has been approved for snap VAC but will have a 20% coinsurance and it is not clear what that amount would end up being for her. 05/27/2022: The surface of the wound has deteriorated and it is gray and fibrotic. No significant odor, but the drainage on her dressing was a little bit purulent. 05/31/2022: The wound looks quite a bit better today. It is still a little fibrotic but no longer has purulent-looking drainage. The color is better. She spoke with her insurance company and is interested in trying the snap VAC, now that she is aware of the cost to her. 06/07/2022: After 1 week in the snap VAC, there has been substantial improvement to her wound. The undermined portion is closing in. The surface has a healthier color and appearance. There is very minimal slough accumulation. 06/14/2022: The more shallow part of the undermined portion of her wound has closed. There is still some undermining from about 1-2 o'clock. The surface continues to improve. Minimal slough accumulation. 06/28/2022: The wound is shallower this week and the undermining has essentially closed and completely. The surface tissue is improving. 07/05/2022: The depth is almost immeasurable and the surface is nearly flush with the surrounding skin. The undermining has been eliminated. There is light slough on the wound surface. 07/12/2022: There is a little bit of slough on the wound surface. The depth is about the same as last week. 07/19/2022: The wound is essentially flush with the surrounding  skin surface. There is slight slough present. No real change in the AP or  transverse dimensions. 07/26/2022: The wound measured a little bit smaller today. It is flush with the surrounding skin surface and there is good granulation tissue present. Minimal slough and biofilm accumulation. 08/02/2022: The right posterior leg wound is a little bit smaller. There is a little slough accumulation. She reported a new wound to [place] today. It is on her left posterior calf. It has been present for about 6 weeks. She is not sure of how it occurred. She has been trying to manage it at home with topical Neosporin and Band-Aids. The fat layer is exposed. The surface is fibrotic and covered with slough. 08/09/2022: The right posterior leg wound is smaller again this week. There is good granulation tissue on the surface with minimal slough accumulation. The left posterior calf wound has built up a thick layer of slough. It is still quite fibrotic underneath the slough, but there is a little bit of a pink color beginning to emerge. Edema control is good. Electronic Signature(s) Signed: 08/09/2022 4:12:34 PM By: [name] MD FACS Entered By: [name] on 08/09/2022 16:12:34 -------------------------------------------------------------------------------- Physical Exam Details Patient Name: Date of Service: [address], [name] 08/09/2022 1:15 PM Medical Record Number: [id number] Patient Account Number: [id number] Date of Birth/Sex: Treating RN: 08-Apr-1961 (62 y.o. F) Primary Care Provider: Nira Conn Other Clinician: Referring Provider: Treating Provider/Extender: Andrey Campanile in Treatment: 28 Constitutional . . . . no acute distress. Respiratory Normal work of breathing on room air. Notes 08/09/2022: The right posterior leg wound is smaller again this week. There is good granulation tissue on the surface with minimal slough accumulation. The left posterior calf wound has built up a thick layer of slough. It is still quite fibrotic underneath  the slough, but there is a little bit of a pink color beginning to emerge. Edema control is good. Electronic Signature(s) Signed: 08/09/2022 4:13:02 PM By: [name] MD FACS Entered By: [name] on 08/09/2022 16:13:02 -------------------------------------------------------------------------------- Physician Orders Details Patient Name: Date of Service: [address] [name] 08/09/2022 1:15 PM Medical Record Number: [id number] Patient Account Number: [id number] Date of Birth/Sex: Treating RN: 1960/05/04 (62 y.o. [name] Primary Care Provider: Nira Conn Other Clinician: Referring Provider: Treating Provider/Extender: Andrey Campanile in Treatment: [address] 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 - Dr. Lady Gary RM 1 Return A Friday 5/10 @ 2:45 pm Anesthetic (In clinic) Topical Lidocaine 4% applied to wound bed Bathing/ Shower/ Hygiene May shower with protection but do not get wound dressing(s) wet. Protect dressing(s) with water repellant cover (for example, large plastic bag) or a cast cover and may then take shower. Edema Control - Lymphedema / SCD / Other Bilateral Lower Extremities Elevate legs to the level of the heart or above for 30 minutes daily and/or when sitting for 3-4 times a day throughout the day. Avoid standing for long periods of time. Exercise regularly Moisturize legs daily. Wound Treatment Wound #2 - Lower Leg Wound Laterality: Right, Posterior Cleanser: Soap and Water 1 x Per Week/30 Days Discharge  Instructions: May shower and wash wound  with dial antibacterial soap and water prior to dressing change. Cleanser: Wound Cleanser 1 x Per Week/30 Days Discharge Instructions: Cleanse the wound with wound cleanser prior to applying a clean dressing using gauze sponges, not tissue or cotton balls. Peri-Wound Care: Sween Lotion (Moisturizing lotion) 1 x Per Week/30 Days Discharge Instructions: Apply moisturizing lotion as directed Secondary Dressing: Woven Gauze Sponge, Non-Sterile 4x4 in 1 x Per Week/30 Days Discharge Instructions: Apply over primary dressing as directed. Compression Wrap: Urgo K2 Lite, two layer compression system, regular 1 x Per Week/30 Days Discharge Instructions: Apply Urgo K2 Lite as directed (alternative to 3 layer compression). Wound #3 - Lower Leg Wound Laterality: Left, Posterior Cleanser: Soap and Water 1 x Per Week/30 Days Discharge Instructions: May shower and wash wound with dial antibacterial soap and water prior to dressing change. Cleanser: Wound Cleanser 1 x Per Week/30 Days Discharge Instructions: Cleanse the wound with wound cleanser prior to applying a clean dressing using gauze sponges, not tissue or cotton balls. Peri-Wound Care: Sween Lotion (Moisturizing lotion) 1 x Per Week/30 Days Discharge Instructions: Apply moisturizing lotion as directed Prim Dressing: Maxorb Extra Ag+ Alginate Dressing, 2x2 (in/in) 1 x Per Week/30 Days ary Discharge Instructions: Apply to wound bed as instructed Secondary Dressing: Woven Gauze Sponge, Non-Sterile 4x4 in 1 x Per Week/30 Days Discharge Instructions: Apply over primary dressing as directed. Compression Wrap: Urgo K2 Lite, two layer compression system, regular 1 x Per Week/30 Days Discharge Instructions: Apply Urgo K2 Lite as directed (alternative to 3 layer compression). Electronic Signature(s) Signed: 08/09/2022 5:53:18 PM By: [name] MD FACS Previous Signature: 08/09/2022 2:54:46 PM Version By: [name] RN, BSN Entered By: [name] on 08/09/2022 16:13:34 [name] ([id number]) [id number] Page 6 of 12 -------------------------------------------------------------------------------- Problem List Details Patient Name: Date of Service: [name] 08/09/2022 1:15 PM Medical Record Number: [id number] Patient Account Number: [id number] Date of Birth/Sex: Treating RN: Oct 15, 1960 (62 y.o. [name] Primary Care Provider: Nira Conn Other Clinician: Referring Provider: Treating Provider/Extender: Andrey Campanile in Treatment: 28 Active Problems ICD-10 Encounter Code Description Active Date MDM Diagnosis L97.212 Non-pressure chronic ulcer of right calf with fat layer exposed 01/23/2022 No Yes L97.222 Non-pressure chronic ulcer of left calf with fat layer exposed 08/02/2022 No Yes E66.01 Morbid (severe) obesity due to excess calories 01/23/2022 No Yes I10 Essential (primary) hypertension 01/23/2022 No Yes R60.0 Localized edema 01/23/2022 No Yes Inactive Problems Resolved Problems Electronic Signature(s) Signed: 08/09/2022 4:11:06 PM By: [name] MD FACS Previous Signature: 08/09/2022 2:54:46 PM Version By: [name] RN, BSN Entered By: [name] on 08/09/2022 16:11:06 -------------------------------------------------------------------------------- Progress Note Details Patient Name: Date of Service: [name], [name] 08/09/2022 1:15 PM Medical Record Number: [id number] Patient Account Number: [id number] Date of Birth/Sex: Treating RN: 1960-05-06 (62 y.o. F) Primary Care Provider: Nira Conn Other Clinician: Referring Provider: Treating Provider/Extender: Andrey Campanile in Treatment: 28 Subjective Chief Complaint Information obtained from Patient RLE ulcer History of Present Illness (HPI) 02/16/2020 upon evaluation today patient actually  appears to be doing poorly in regard to her left medial lower extremity ulcer. This is actually an area that she tells me she has had intermittent issues with over the years although has been closed for some time she typically uses compression right now she has juxta lite compression wraps. With that being said she tells me that this nonetheless open several weeks/months ago and has been given her trouble since. She does have  a history of chronic venous insufficiency she is seeing specialist for this in the past she has had an ablation as well as sclerotherapy. With that being said she also has hypertension chronically which is managed by her primary care provider. In general she seems to be worsening overall with regard to the wound and states that she finally realized that she needed to come in and have somebody look at this and not continue to try to manage this on her own. No fevers, chills, nausea, vomiting, or diarrhea. 02/23/2020 on evaluation today patient appears to be doing well with regard to her wound. This is showing some signs of improvement which is great news still were not quite at the point where I would like to be as far as the overall appearance of the wound is concerned but I do believe this is better than last week. I do believe the Iodoflex is helping as well. HILDEGARDE, EMPEY ([id number]) [id number] Page 7 of 12 03/08/2020 upon evaluation today patient appears to be doing well with regard to her wound. She has been tolerating the dressing changes without complication. Fortunately I feel like she has made great progress with the Iodoflex but I feel like it may be the point rest to switch to something else possibly a collagen- based dressing at this time. 03/15/2020 upon evaluation today patient appears to be doing excellent in regard to her leg ulcer. She has been tolerating the dressing changes without complication. Fortunately there is no signs of  active infection. Overall she is measuring a little bit smaller today which is great news. 03/22/2020 upon evaluation today patient appears to be doing well with regard to her wound. She has been tolerating the dressing changes without complication. Fortunately there is no signs of active infection at this time. 03/28/2020; patient I do not usually see however she has a wound on the left anterior lower leg secondary to chronic venous insufficiency we have been using silver collagen under compression. She arrives in clinic with a nonviable surface requiring debridement 04/12/2020 upon evaluation today patient appears to be doing well all things considered with regard to her leg ulcer. She is tolerating the dressing changes without complication there is minimal dry skin around the edges of the wound that may be trapping and stopping some of the events of the new skin I am can work on that today. Otherwise the surface of the wound appears to be doing excellent. 04/19/2020 upon evaluation today patient appears to be doing well with regard to her leg ulcer. She has been tolerating dressing changes without complication. Fortunately there is no signs of active infection at this time. No fever chills noted overall very pleased with how things seem to be progressing. 04/26/2020 on evaluation today patient appears to be doing well with regard to her wound currently. Is showing signs of excellent improvement overall is filling in nicely and there does not appear to be any signs of infection. No fevers, chills, nausea, vomiting, or diarrhea. 05/03/2020 upon evaluation today patient appears to be doing well with regard to her wound on the leg. This overall showing signs of good improvement which is great she has some good epithelial growth and overall I think that things are moving in the correct direction. We likewise going to continue with the wound care measures as before since she seems to making such good  improvement. 2/3; venous wound on the left medial leg. This is contracting. We are using Prisma and 3 layer compression.  She has a stocking and waiting in the eventuality this heals. She is already using it on the right 05/17/2020 upon evaluation today patient appears to be doing well with regard to her leg ulcer. She has been tolerating the dressing changes without complication. Fortunately there is no signs of active infection which is great news and overall very pleased with where things stand today. No fevers, chills, nausea, vomiting, or diarrhea. 05/24/2020 upon evaluation today patient appears to be doing well with regard to her wound. Overall I feel like she is making excellent progress. There does not appear to be any signs of active infection which is great news. 05/31/2020 upon evaluation today patient appears to be doing well with regard to her wound. There does not appear to be any signs of active infection which is great news overall I am extremely pleased with where things stand today. READMISSION 01/23/2022 She returns to clinic today with a new wound on her right posterior calf. She says that she was cleaning out an old shed near the middle of August this year and then noticed what seemed to be a bug bite on her right posterior calf. It was itchy, red, and raised. By the end of September, an ulcer had developed. She has been applying various topical creams such as hydrocortisone and others to the site. When it was not improving, she made an appointment in the wound care center. ABI in clinic today was 0.97. On her right posterior calf, there is a circular wound with necrotic fat and black eschar present. There is no purulent drainage or malodor. The periwound skin is in good condition with just a little induration that appears to be secondary to inflammation. 01/31/2022: The wound measures a little bit larger today, but overall is quite a bit cleaner. There is some undermining from 9:00  to 3:00. She is having some periwound itching and says that her wrap slid. 02/08/2022: Despite using an Unna boot first layer at the top of her wrap, it slid again and it looks like there is been some bruising at the wound site. The wound is about the same size in terms of dimensions. There is a fair amount of slough and nonviable tissue still present. Edema control is better than last week. 02/15/2022: The wound dimensions are about the same. There is less nonviable tissue present. The periwound erythema has improved. 02/22/2022: The wound has deteriorated over the past week. It is larger and the periwound is more edematous, erythematous, and indurated. It looks as though there has been more tissue breakdown with undermining present. She is having more pain. There is a foul odor coming from the wound. 02/26/2022: The wound looks quite a bit better today and the odor is gone. I still do not have her culture data back, but she seems to be responding well to the Augmentin. 03/06/2022: Her wound continues to improve. There is still some slough accumulation, but the periwound skin is less inflamed. Her culture returned with a polymicrobial population including Pseudomonas and so levofloxacin was also prescribed. She did not understand why a second antibiotic was being added so she has not yet initiated this. 03/14/2022: The wound is about the same, to perhaps a little bit larger. There is a fair amount of slough accumulation on the surface, as well as some hypertrophic granulation tissue. She is still taking levofloxacin, but has completed taking Augmentin. She has her Jodie Echevaria compound with her today. 12/14; the patient has a significant circular wound on the  posterior right calf. She is using Keystone and silver alginate under 3 layer compression. Her ABIs were within normal limits at 0.97. This may have been traumatic or an insect bite at the start I reviewed these records. 04/04/2022: Since I last saw  the wound, it has contracted considerably. There is a fairly thick layer of slough on the surface, as it has not been debrided since the last time I did it. Edema control is excellent and the periwound skin is in much better condition. 04/12/2022: No significant change in the wound dimensions. It is filling with granulation tissue. Still with slough accumulation on the surface. 04/19/2022: The wound is smaller this week. There is some slough accumulation on the surface. 04/26/2022: The wound is smaller again this week and significantly cleaner. The wound surface is a little bit drier than ideal. 05/03/2022: The wound measurements were about the same, but visually it appears smaller. There is still some undermining at the top of the wound. Moisture balance is better this week. 05/10/2022: The wound measured smaller today. There is still a fair amount of undermining present. Slough has built up on the surface. We are still awaiting snap VAC approval. 05/17/2022: The wound is a little bit smaller today. There is less slough on the surface, but the granulation tissue still is not very robust. She has been approved for snap VAC but will have a 20% coinsurance and it is not clear what that amount would end up being for her. 05/27/2022: The surface of the wound has deteriorated and it is gray and fibrotic. No significant odor, but the drainage on her dressing was a little bit purulent. 05/31/2022: The wound looks quite a bit better today. It is still a little fibrotic but no longer has purulent-looking drainage. The color is better. She spoke with her insurance company and is interested in trying the snap VAC, now that she is aware of the cost to her. STEFANEE, GRIGAS ([id number]) [id number] Page 8 of 12 06/07/2022: After 1 week in the snap VAC, there has been substantial improvement to her wound. The undermined portion is closing in. The surface has a healthier color and appearance. There  is very minimal slough accumulation. 06/14/2022: The more shallow part of the undermined portion of her wound has closed. There is still some undermining from about 1-2 o'clock. The surface continues to improve. Minimal slough accumulation. 06/28/2022: The wound is shallower this week and the undermining has essentially closed and completely. The surface tissue is improving. 07/05/2022: The depth is almost immeasurable and the surface is nearly flush with the surrounding skin. The undermining has been eliminated. There is light slough on the wound surface. 07/12/2022: There is a little bit of slough on the wound surface. The depth is about the same as last week. 07/19/2022: The wound is essentially flush with the surrounding skin surface. There is slight slough present. No real change in the AP or transverse dimensions. 07/26/2022: The wound measured a little bit smaller today. It is flush with the surrounding skin surface and there is good granulation tissue present. Minimal slough and biofilm accumulation. 08/02/2022: The right posterior leg wound is a little bit smaller. There is a little slough accumulation. She reported a new wound to [place] today. It is on her left posterior calf. It has been present for about 6 weeks. She is not sure of how it occurred. She has been trying to manage it at home with topical Neosporin and Band-Aids. The fat layer is exposed.  The surface is fibrotic and covered with slough. 08/09/2022: The right posterior leg wound is smaller again this week. There is good granulation tissue on the surface with minimal slough accumulation. The left posterior calf wound has built up a thick layer of slough. It is still quite fibrotic underneath the slough, but there is a little bit of a pink color beginning to emerge. Edema control is good. Patient History Information obtained from Patient. Family History Cancer - Mother, Diabetes - Father, Hypertension - Mother, Kidney Disease - Mother, Lung  Disease - Father, Thyroid Problems - Paternal Grandparents, No family history of Heart Disease, Seizures, Stroke, Tuberculosis. Social History Never smoker, Marital Status - Married, Alcohol Use - Never, Drug Use - No History, Caffeine Use - Daily - coffee. Medical History Eyes Denies history of Cataracts, Glaucoma, Optic Neuritis Ear/Nose/Mouth/Throat Denies history of Chronic sinus problems/congestion, Middle ear problems Hematologic/Lymphatic Patient has history of Anemia - iron Denies history of Hemophilia, Human Immunodeficiency Virus, Lymphedema, Sickle Cell Disease Respiratory Patient has history of Sleep Apnea - CPAP Denies history of Aspiration, Asthma, Chronic Obstructive Pulmonary Disease (COPD), Pneumothorax, Tuberculosis Cardiovascular Patient has history of Hypertension, Peripheral Venous Disease Denies history of Angina, Arrhythmia, Congestive Heart Failure, Coronary Artery Disease, Deep Vein Thrombosis, Hypotension, Myocardial Infarction, Peripheral Arterial Disease, Phlebitis, Vasculitis Gastrointestinal Denies history of Cirrhosis , Colitis, Crohnoos, Hepatitis A, Hepatitis B, Hepatitis C Endocrine Denies history of Type I Diabetes, Type II Diabetes Genitourinary Denies history of End Stage Renal Disease Immunological Denies history of Lupus Erythematosus, Raynaudoos, Scleroderma Integumentary (Skin) Denies history of History of Burn Musculoskeletal Denies history of Gout, Rheumatoid Arthritis, Osteoarthritis, Osteomyelitis Neurologic Denies history of Dementia, Neuropathy, Quadriplegia, Paraplegia, Seizure Disorder Oncologic Denies history of Received Chemotherapy, Received Radiation Psychiatric Denies history of Anorexia/bulimia, Confinement Anxiety Hospitalization/Surgery History - cholecystectomy 1980s. - nephrolithasis 1980s. - plate and rod in right elbow surgery 2010. Medical A Surgical History Notes nd Genitourinary years ago kidney  stones Oncologic skin Ca removed from back years ago Objective KAITLYNNE, ZYSKOWSKI ([id number]) [id number].pdf Page 9 of 12 Constitutional no acute distress. Vitals Time Taken: 1:20 AM, Height: 61 in, Weight: 277 lbs, BMI: 52.3, Temperature: 97.5 F, Pulse: 84 bpm, Respiratory Rate: 20 breaths/min, Blood Pressure: 135/72 mmHg. Respiratory Normal work of breathing on room air. General Notes: 08/09/2022: The right posterior leg wound is smaller again this week. There is good granulation tissue on the surface with minimal slough accumulation. The left posterior calf wound has built up a thick layer of slough. It is still quite fibrotic underneath the slough, but there is a little bit of a pink color beginning to emerge. Edema control is good. Integumentary (Hair, Skin) Wound #2 status is Open. Original cause of wound was Insect Bite. The date acquired was: 11/21/2021. The wound has been in treatment 28 weeks. The wound is located on the Right,Posterior Lower Leg. The wound measures 2.1cm length x 1.8cm width x 0.1cm depth; 2.969cm^2 area and 0.297cm^3 volume. There is Fat Layer (Subcutaneous Tissue) exposed. There is no tunneling or undermining noted. There is a medium amount of serosanguineous drainage noted. The wound margin is flat and intact. There is large (67-100%) red, hyper - granulation within the wound bed. There is a small (1-33%) amount of necrotic tissue within the wound bed including Adherent Slough. The periwound skin appearance had no abnormalities noted for texture. The periwound skin appearance had no abnormalities noted for moisture. The periwound skin appearance exhibited: Hemosiderin Staining. The periwound skin appearance did not exhibit: Atrophie  Blanche, Ecchymosis, Rubor, Erythema. Periwound temperature was noted as No Abnormality. The periwound has tenderness on palpation. Wound #3 status is Open. Original cause of wound was Trauma. The date acquired  was: 06/28/2022. The wound has been in treatment 1 weeks. The wound is located on the Left,Posterior Lower Leg. The wound measures 1.3cm length x 1cm width x 0.1cm depth; 1.021cm^2 area and 0.102cm^3 volume. There is Fat Layer (Subcutaneous Tissue) exposed. There is no tunneling or undermining noted. There is a medium amount of serous drainage noted. The wound margin is flat and intact. There is small (1-33%) pink granulation within the wound bed. There is a large (67-100%) amount of necrotic tissue within the wound bed including Adherent Slough. The periwound skin appearance had no abnormalities noted for texture. The periwound skin appearance had no abnormalities noted for moisture. The periwound skin appearance exhibited: Erythema. The surrounding wound skin color is noted with erythema. Erythema is measured at 1.5 cm. Periwound temperature was noted as No Abnormality. The periwound has tenderness on palpation. 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 There was a Selective/Open Wound Non-Viable Tissue Debridement with a total area of 2.97 sq cm performed by [name], MD. With the following instrument(s): Curette to remove Non-Viable tissue/material. Material removed includes Slough and Biofilm and after achieving pain control using Lidocaine 4% T opical Solution. No specimens were taken. A time out was conducted at 14:00, prior to the start of the procedure. A Minimum amount of bleeding was controlled with Pressure. The procedure was tolerated well with a pain level of 0 throughout and a pain level  of 0 following the procedure. Post Debridement Measurements: 2.1cm length x 1.8cm width x 0.1cm depth; 0.297cm^3 volume. Character of Wound/Ulcer Post Debridement is improved. Severity of Tissue Post Debridement is: Fat layer exposed. Post procedure Diagnosis Wound #2: Same as Pre-Procedure General Notes: scribed for Dr. Lady Gary by [name], RN. Pre-procedure diagnosis of Wound #2 is a Venous Leg Ulcer located on the Right,Posterior Lower Leg . There was a Double Layer Compression Therapy Procedure by [name], RN. Post procedure Diagnosis Wound #2: Same as Pre-Procedure Notes: urgo lite. Wound #3 Pre-procedure diagnosis of Wound #3 is a Venous Leg Ulcer located on the Left,Posterior Lower Leg .Severity of Tissue Pre Debridement is: Fat layer exposed. There was a Excisional Skin/Subcutaneous Tissue Debridement with a total area of 1.02 sq cm performed by [name], MD. With the following instrument(s): Curette to remove Viable and Non-Viable tissue/material. Material removed includes Subcutaneous Tissue and Slough and after achieving pain control using Lidocaine 4% Topical Solution. No specimens were taken. A time out was conducted at 14:00, prior to the start of the procedure. A Minimum amount of bleeding was controlled with Pressure. The procedure was tolerated well with a pain level of 0 throughout and a pain level of 0 following the procedure. Post Debridement Measurements: 1.3cm length x 1cm width x 0.1cm depth; 0.102cm^3 volume. Character of Wound/Ulcer Post Debridement is improved. Severity of Tissue Post Debridement is: Fat layer exposed. Post procedure Diagnosis Wound #3: Same as Pre-Procedure General Notes: scribed for Dr. Lady Gary by [name], RN. Pre-procedure diagnosis of Wound #3 is  a Venous Leg Ulcer located on the Left,Posterior Lower Leg . There was a Double Layer Compression Therapy Procedure by [name], RN. Post procedure Diagnosis Wound #3: Same  as Pre-Procedure Notes: urgo lite. ALANYS, HEISE ([id number]) [id number] Page 10 of 12 Plan Follow-up Appointments: Return Appointment in 1 week. - Dr. Lady Gary RM 1 Friday 5/10 @ 2:45 pm Anesthetic: (In clinic) Topical Lidocaine 4% applied to wound bed Bathing/ Shower/ Hygiene: May shower with protection but do not get wound dressing(s) wet. Protect dressing(s) with water repellant cover (for example, large plastic bag) or a cast cover and may then take shower. Edema Control - Lymphedema / SCD / Other: Elevate legs to the level of the heart or above for 30 minutes daily and/or when sitting for 3-4 times a day throughout the day. Avoid standing for long periods of time. Exercise regularly Moisturize legs daily. WOUND #2: - Lower Leg Wound Laterality: Right, Posterior Cleanser: Soap and Water 1 x Per Week/30 Days Discharge Instructions: May shower and wash wound with dial antibacterial soap and water prior to dressing change. Cleanser: Wound Cleanser 1 x Per Week/30 Days Discharge Instructions: Cleanse the wound with wound cleanser prior to applying a clean dressing using gauze sponges, not tissue or cotton balls. Peri-Wound Care: Sween Lotion (Moisturizing lotion) 1 x Per Week/30 Days Discharge Instructions: Apply moisturizing lotion as directed Secondary Dressing: Woven Gauze Sponge, Non-Sterile 4x4 in 1 x Per Week/30 Days Discharge Instructions: Apply over primary dressing as directed. Com pression Wrap: Urgo K2 Lite, two layer compression system, regular 1 x Per Week/30 Days Discharge Instructions: Apply Urgo K2 Lite as directed (alternative to 3 layer compression). WOUND #3: - Lower Leg Wound Laterality: Left, Posterior Cleanser: Soap and Water 1 x Per Week/30 Days Discharge Instructions: May shower and wash wound with dial antibacterial soap and water prior to dressing change. Cleanser: Wound Cleanser 1 x Per Week/30 Days Discharge Instructions:  Cleanse the wound with wound cleanser prior to applying a clean dressing using gauze sponges, not tissue or cotton balls. Peri-Wound Care: Sween Lotion (Moisturizing lotion) 1 x Per Week/30 Days Discharge Instructions: Apply moisturizing lotion as directed Prim Dressing: Maxorb Extra Ag+ Alginate Dressing, 2x2 (in/in) 1 x Per Week/30 Days ary Discharge Instructions: Apply to wound bed as instructed Secondary Dressing: Woven Gauze Sponge, Non-Sterile 4x4 in 1 x Per Week/30 Days Discharge Instructions: Apply over primary dressing as directed. Com pression Wrap: Urgo K2 Lite, two layer compression system, regular 1 x Per Week/30 Days Discharge Instructions: Apply Urgo K2 Lite as directed (alternative to 3 layer compression). 08/09/2022: The right posterior leg wound is smaller again this week. There is good granulation tissue on the surface with minimal slough accumulation. The left posterior calf wound has built up a thick layer of slough. It is still quite fibrotic underneath the slough, but there is a little bit of a pink color beginning to emerge. Edema control is good. I used a curette to debride slough and biofilm from the right leg wound and slough and subcutaneous tissue from the left leg wound. We will continue silver alginate to both with bilateral 3 layer equivalent compression wraps. Follow-up in 1 week. Electronic Signature(s) Signed: 08/09/2022 4:14:20 PM By: [name] MD FACS Entered By: [name] on 08/09/2022 16:14:19 -------------------------------------------------------------------------------- HxROS Details Patient Name: Date of Service: [address] [name] 08/09/2022 1:15 PM Medical Record Number: [id number] Patient Account Number: [id number] Date of Birth/Sex: Treating RN: November 21, 1960 (62 y.o. F) Primary Care Provider: Nira Conn  Other Clinician: Referring Provider: Treating Provider/Extender: Andrey Campanile in Treatment:  28 Information Obtained From Patient Eyes Medical History: Negative for: Cataracts; Glaucoma; Optic Neuritis Ear/Nose/Mouth/Throat Medical History: Negative for: Chronic sinus problems/congestion; Middle ear problems Hematologic/Lymphatic TABBITHA, ROSEVEAR ([id number]) [id number] Page 11 of 12 Medical History: Positive for: Anemia - iron Negative for: Hemophilia; Human Immunodeficiency Virus; Lymphedema; Sickle Cell Disease Respiratory Medical History: Positive for: Sleep Apnea - CPAP Negative for: Aspiration; Asthma; Chronic Obstructive Pulmonary Disease (COPD); Pneumothorax; Tuberculosis Cardiovascular Medical History: Positive for: Hypertension; Peripheral Venous Disease Negative for: Angina; Arrhythmia; Congestive Heart Failure; Coronary Artery Disease; Deep Vein Thrombosis; Hypotension; Myocardial Infarction; Peripheral Arterial Disease; Phlebitis; Vasculitis Gastrointestinal Medical History: Negative for: Cirrhosis ; Colitis; Crohns; Hepatitis A; Hepatitis B; Hepatitis C Endocrine Medical History: Negative for: Type I Diabetes; Type II Diabetes Genitourinary Medical History: Negative for: End Stage Renal Disease Past Medical History Notes: years ago kidney stones Immunological Medical History: Negative for: Lupus Erythematosus; Raynauds; Scleroderma Integumentary (Skin) Medical History: Negative for: History of Burn Musculoskeletal Medical History: Negative for: Gout; Rheumatoid Arthritis; Osteoarthritis; Osteomyelitis Neurologic Medical History: Negative for: Dementia; Neuropathy; Quadriplegia; Paraplegia; Seizure Disorder Oncologic Medical History: Negative for: Received Chemotherapy; Received Radiation Past Medical History Notes: skin Ca removed from back years ago Psychiatric Medical History: Negative for: Anorexia/bulimia; Confinement Anxiety Immunizations Pneumococcal Vaccine: Received Pneumococcal Vaccination:  No Implantable Devices None Hospitalization / Surgery History Type of Hospitalization/Surgery cholecystectomy 1980s nephrolithasis 1980s TAZHANE, DIETRICH ([id number]) [id number].pdf Page 12 of 12 plate and rod in right elbow surgery 2010 Family and Social History Cancer: Yes - Mother; Diabetes: Yes - Father; Heart Disease: No; Hypertension: Yes - Mother; Kidney Disease: Yes - Mother; Lung Disease: Yes - Father; Seizures: No; Stroke: No; Thyroid Problems: Yes - Paternal Grandparents; Tuberculosis: No; Never smoker; Marital Status - Married; Alcohol Use: Never; Drug Use: No History; Caffeine Use: Daily - coffee; Financial Concerns: No; Food, Clothing or Shelter Needs: No; Support System Lacking: No; Transportation Concerns: No Electronic Signature(s) Signed: 08/09/2022 5:53:18 PM By: [name] MD FACS Entered By: [name] on 08/09/2022 16:12:41 -------------------------------------------------------------------------------- SuperBill Details Patient Name: Date of Service: [name], [name] 08/09/2022 Medical Record Number: [id number] Patient Account Number: [id number] Date of Birth/Sex: Treating RN: [birth year] (62 y.o. 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 E66.01 Morbid (severe) obesity due to excess calories I10 Essential (primary) hypertension R60.0 Localized edema Facility Procedures : CPT4 Code: 09323557 Description: 11042 - DEB SUBQ TISSUE 20 SQ CM/< ICD-10 Diagnosis Description L97.222 Non-pressure chronic ulcer of left calf with fat layer exposed Modifier: Quantity: 1 : CPT4 Code: 32202542 Description: 97597 - DEBRIDE  WOUND 1ST 20 SQ CM OR < ICD-10 Diagnosis Description L97.212 Non-pressure chronic ulcer of right calf with fat layer exposed Modifier: Quantity: 1 Physician Procedures : CPT4 Code Description Modifier [id number] 99213 - WC PHYS LEVEL 3 - EST PT 25 ICD-10 Diagnosis Description 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

## 2022-08-15 ENCOUNTER — Other Ambulatory Visit: Payer: Self-pay | Admitting: Family Medicine

## 2022-08-15 DIAGNOSIS — Z1231 Encounter for screening mammogram for malignant neoplasm of breast: Secondary | ICD-10-CM

## 2022-08-16 ENCOUNTER — Encounter (HOSPITAL_BASED_OUTPATIENT_CLINIC_OR_DEPARTMENT_OTHER): Payer: BC Managed Care – PPO | Admitting: General Surgery

## 2022-08-16 DIAGNOSIS — L97212 Non-pressure chronic ulcer of right calf with fat layer exposed: Secondary | ICD-10-CM | POA: Diagnosis not present

## 2022-08-17 NOTE — Progress Notes (Signed)
[name], [name] ([id number]) [id number] Page 1 of 9 Visit Report for 08/16/2022 Arrival Information Details Patient Name: Date of Service: [name], [name] [name] 08/16/2022 2:45 PM Medical Record Number: [id number] Patient Account Number: [id number] Date of Birth/Sex: Treating RN: 1960/04/27 (62 y.o. F) Primary Care [name]: [name] Other Clinician: Referring [name]: Treating [name]/Extender: [name] in Treatment: 29 Visit Information History Since Last Visit All ordered tests and consults were completed: No Patient Arrived: Ambulatory Added or deleted any medications: No Arrival Time: 14:54 Any new allergies or adverse reactions: No Accompanied By: self Had a fall or experienced change in No Transfer Assistance: None activities of daily living that may affect Patient Identification Verified: Yes risk of falls: Secondary Verification Process Completed: Yes Signs or symptoms of abuse/neglect since last visito No Patient Requires Transmission-Based Precautions: No Hospitalized since last visit: No Patient Has Alerts: No Implantable device outside of the clinic excluding No cellular tissue based products placed in the center since last visit: Pain Present Now: No Electronic Signature(s) Signed: 08/16/2022 3:57:41 PM By: [name] Entered By: [name] on 08/16/2022 14:54:35 -------------------------------------------------------------------------------- Compression Therapy Details Patient Name: Date of Service: [name], [name] [name] 08/16/2022 2:45 PM Medical Record Number: [id number] Patient Account Number: [id number] Date of Birth/Sex: Treating RN: 1960-12-13 (62 y.o. F) Primary Care Reylene Stauder: [name] Other Clinician: Referring Tulip Meharg: Treating [name]/Extender: [name] in Treatment: 29 Compression Therapy Performed for Wound Assessment: Wound #2  Right,Posterior Lower Leg Performed By: Clinician , Compression Type: Three Layer Post Procedure Diagnosis Same as Pre-procedure Electronic Signature(s) Signed: 08/16/2022 3:57:41 PM By: [name] Entered By: [name] on 08/16/2022 15:16:47 -------------------------------------------------------------------------------- Compression Therapy Details Patient Name: Date of Service: [name] 08/16/2022 2:45 PM Medical Record Number: [id number] Patient Account Number: [id number] Date of Birth/Sex: Treating RN: [name] 05, 1962 (62 y.o. F) Primary Care Christol Thetford: [name] Other Clinician: Referring Chidera Thivierge: Treating Anthonyjames Bargar/Extender: [name] in Treatment: 29 Compression Therapy Performed for Wound Assessment: Wound #3 Left,Posterior Lower Leg Performed By: Clinician , Compression Type: Three Layer Post Procedure Diagnosis Same as [name], [name] ([id number]) [id number] Page 2 of 9 Electronic Signature(s) Signed: 08/16/2022 3:57:41 PM By: [name] Entered By: [name] on 08/16/2022 15:16:47 -------------------------------------------------------------------------------- Encounter Discharge Information Details Patient Name: Date of Service: [name], [name] [name] H. 08/16/2022 2:45 PM Medical Record Number: [id number] Patient Account Number: [id number] Date of Birth/Sex: Treating RN: 1960/11/02 (62 y.o. [name] Primary Care Egbert Seidel: [name] Other Clinician: Referring Miah Boye: Treating Miguel Medal/Extender: [name] in Treatment: 29 Encounter Discharge Information Items Post Procedure Vitals Discharge Condition: Stable Temperature (F): 98.2 Ambulatory Status: Ambulatory Pulse (bpm): 106 Discharge Destination: Home Respiratory Rate (breaths/min): 18 Transportation: Private Auto Blood Pressure (mmHg): 128/83 Accompanied By: self Schedule  Follow-up Appointment: Yes Clinical Summary of Care: Patient Declined Electronic Signature(s) Signed: 08/16/2022 4:28:56 PM By: [name] RN, BSN Entered By: [name] on 08/16/2022 16:12:13 -------------------------------------------------------------------------------- Lower Extremity Assessment Details Patient Name: Date of Service: [place], [name] [name] H. 08/16/2022 2:45 PM Medical Record Number: [id number] Patient Account Number: [id number] Date of Birth/Sex: Treating RN: 08-Jul-1960 (62 y.o. F) Primary Care Kyser Wandel: [name] Other Clinician: Referring Stefon Ramthun: Treating Torri Michalski/Extender: [name] in Treatment: 29 Edema Assessment Assessed: [Left: No] [Right: No] Edema: [Left: Yes] [Right: Yes] Calf Left: Right: Point of Measurement: From Medial Instep 41 cm 41.5 cm Ankle Left: Right: Point of Measurement: From Medial Instep  23.5 cm 23.5 cm Vascular Assessment Pulses: Dorsalis Pedis Palpable: [Left:Yes] [Right:Yes] Electronic Signature(s) Signed: 08/16/2022 3:57:41 PM By: [name] Entered By: [name] on 08/16/2022 14:58:27 -------------------------------------------------------------------------------- Multi Wound Chart Details Patient Name: Date of Service: [name], [name] [name] H. 08/16/2022 2:45 PM [name] ([id number]) [id number] Page 3 of 9 Medical Record Number: [id number] Patient Account Number: [id number] Date of Birth/Sex: Treating RN: 1960/04/18 (62 y.o. [name], [name] Primary Care Demetric Dunnaway: [name] Other Clinician: Referring Kadence Mimbs: Treating Yianni Skilling/Extender: [name] in Treatment: 29 Vital Signs Height(in): 61 Pulse(bpm): 106 Weight(lbs): 277 Blood Pressure(mmHg): 128/83 Body Mass Index(BMI): 52.3 Temperature(F): 98.2 Respiratory Rate(breaths/min): 20 [2:Photos:] [N/A:N/A] Right, Posterior Lower Leg Left, Posterior  Lower Leg N/A Wound Location: Insect Bite Trauma N/A Wounding Event: Venous Leg Ulcer Venous Leg Ulcer N/A Primary Etiology: Anemia, Sleep Apnea, Hypertension, Anemia, Sleep Apnea, Hypertension, N/A Comorbid History: Peripheral Venous Disease Peripheral Venous Disease 11/21/2021 06/28/2022 N/A Date Acquired: 29 2 N/A Weeks of Treatment: Open Open N/A Wound Status: No No N/A Wound Recurrence: 1.6x1.4x0.1 1.4x1x0.2 N/A Measurements L x W x D (cm) 1.759 1.1 N/A A (cm) : rea 0.176 0.22 N/A Volume (cm) : -433.00% 17.10% N/A % Reduction in A rea: -77.80% -65.40% N/A % Reduction in Volume: Full Thickness Without Exposed Full Thickness Without Exposed N/A Classification: Support Structures Support Structures Medium Medium N/A Exudate A mount: Serosanguineous Serous N/A Exudate Type: red, brown amber N/A Exudate Color: Flat and Intact Flat and Intact N/A Wound Margin: Large (67-100%) Small (1-33%) N/A Granulation A mount: Red Pink N/A Granulation Quality: Small (1-33%) Large (67-100%) N/A Necrotic A mount: Fat Layer (Subcutaneous Tissue): Yes Fat Layer (Subcutaneous Tissue): Yes N/A Exposed Structures: Fascia: No Fascia: No Tendon: No Tendon: No Muscle: No Muscle: No Joint: No Joint: No Bone: No Bone: No Small (1-33%) None N/A Epithelialization: Debridement - Selective/Open Wound Debridement - Selective/Open Wound N/A Debridement: Pre-procedure Verification/Time Out 15:15 15:15 N/A Taken: Lidocaine 4% Topical Solution Lidocaine 4% Topical Solution N/A Pain Control: Necrotic/[facility] N/A Tissue Debrided: Non-Viable Tissue Non-Viable Tissue N/A Level: 1.76 1.1 N/A Debridement A (sq cm): rea Curette Curette N/A Instrument: Minimum Minimum N/A Bleeding: Pressure Pressure N/A Hemostasis A chieved: 0 0 N/A Procedural Pain: 0 0 N/A Post Procedural Pain: Procedure was tolerated well Procedure was tolerated well N/A Debridement Treatment  Response: 1.6x1.4x0.1 1.4x1x0.2 N/A Post Debridement Measurements L x W x D (cm) 0.176 0.22 N/A Post Debridement Volume: (cm) Excoriation: No Rash: Yes N/A Periwound Skin Texture: Scarring: No No Abnormalities Noted No Abnormalities Noted N/A Periwound Skin Moisture: Hemosiderin Staining: Yes Erythema: Yes N/A Periwound Skin Color: Atrophie Blanche: No Ecchymosis: No Erythema: No Rubor: No N/A Measured: 1.5cm N/A Erythema Measurement: N/A Decreased N/A Erythema Change: No Abnormality No Abnormality N/A Temperature: Yes Yes N/A Tenderness on PalpationDENN[place], [name] ([id number]) [id number] Page 4 of 9 Compression Therapy Compression Therapy N/A Procedures Performed: Debridement Debridement Treatment Notes Electronic Signature(s) Signed: 08/16/2022 3:40:03 PM By: [name] MD FACS Signed: 08/16/2022 4:28:56 PM By: [name] RN, BSN Entered By: [name] on 08/16/2022 15:40:03 -------------------------------------------------------------------------------- Multi-Disciplinary Care Plan Details Patient Name: Date of Service: [place], [name] [name] H. 08/16/2022 2:45 PM Medical Record Number: [id number] Patient Account Number: [id number] Date of Birth/Sex: Treating RN: [birth year] (62 y.o. F) Primary Care Orvill Coulthard: [name] Other Clinician: Referring Alquan Morrish: Treating Addysyn Fern/Extender: [name] in Treatment: 29 Multidisciplinary Care Plan reviewed with physician Active Inactive Necrotic Tissue Nursing Diagnoses: Impaired tissue integrity related  to necrotic/devitalized tissue Knowledge deficit related to management of necrotic/devitalized tissue Goals: Necrotic/devitalized tissue will be minimized in the wound bed Date Initiated: 01/23/2022 Target Resolution Date: 08/30/2022 Goal Status: Active Patient/caregiver will verbalize understanding of reason and process for debridement of  necrotic tissue Date Initiated: 01/23/2022 Target Resolution Date: 08/30/2022 Goal Status: Active Interventions: Assess patient pain level pre-, during and post procedure and prior to discharge Provide education on necrotic tissue and debridement process Treatment Activities: Apply topical anesthetic as ordered : 01/23/2022 Notes: Venous Leg Ulcer Nursing Diagnoses: Knowledge deficit related to disease process and management Potential for venous Insuffiency (use before diagnosis confirmed) Goals: Patient will maintain optimal edema control Date Initiated: 07/05/2022 Target Resolution Date: 08/30/2022 Goal Status: Active Interventions: Assess peripheral edema status every visit. Compression as ordered Provide education on venous insufficiency Treatment Activities: Therapeutic compression applied : 07/05/2022 Notes: Wound/Skin Impairment Nursing Diagnoses: Impaired tissue integrity [facility]NA, [name] ([id number]) [phone].pdf Page 5 of 9 Knowledge deficit related to ulceration/compromised skin integrity Goals: Patient/caregiver will verbalize understanding of skin care regimen Date Initiated: 01/23/2022 Target Resolution Date: 08/30/2022 Goal Status: Active Interventions: Assess patient/caregiver ability to obtain necessary supplies Assess ulceration(s) every visit Treatment Activities: Skin care regimen initiated : 01/23/2022 Topical wound management initiated : 01/23/2022 Notes: Electronic Signature(s) Signed: 08/16/2022 3:57:41 PM By: [name] Entered By: [name] on 08/16/2022 15:07:28 -------------------------------------------------------------------------------- Pain Assessment Details Patient Name: Date of Service: [place], [name] [name] 08/16/2022 2:45 PM Medical Record Number: [id number] Patient Account Number: [id number] Date of Birth/Sex: Treating RN: Nov 29, 1960 (62 y.o. F) Primary Care Emad Brechtel: [name] Other  Clinician: Referring Clemente Dewey: Treating Lilliam Chamblee/Extender: [name] in Treatment: 29 Active Problems Location of Pain Severity and Description of Pain Patient Has Paino No Site Locations Pain Management and Medication Current Pain Management: Electronic Signature(s) Signed: 08/16/2022 3:57:41 PM By: [name] Entered By: [name] on 08/16/2022 14:55:12 -------------------------------------------------------------------------------- Patient/Caregiver Education Details Patient Name: Date of Service: [name], [name] 5/10/2024andnbsp2:45 PM Medical Record Number: [id number] Patient Account Number: [id number] Date of Birth/Gender: Treating RN: 1961-03-23 (62 y.o. F) Primary Care Physician: [name] Other Clinician: Referring Physician: Treating Physician/Extender: Heinz Knuckles [employer], Romie Minus ([id number]) [id number].pdf Page 6 of 9 Weeks in Treatment: 29 Education Assessment Education Provided To: Patient Education Topics Provided Venous: Methods: Explain/Verbal Responses: Reinforcements needed, State content correctly Wound/Skin Impairment: Methods: Explain/Verbal Responses: Reinforcements needed, State content correctly Electronic Signature(s) Signed: 08/16/2022 3:57:41 PM By: [name] Entered By: [name] on 08/16/2022 15:07:54 -------------------------------------------------------------------------------- Wound Assessment Details Patient Name: Date of Service: [place], [name] [name] 08/16/2022 2:45 PM Medical Record Number: [id number] Patient Account Number: [id number] Date of Birth/Sex: Treating RN: 1960-11-24 (62 y.o. F) Primary Care Jorryn Hershberger: [name] Other Clinician: Referring Deamonte Sayegh: Treating [name]/Extender: [name] in Treatment: 29 Wound Status Wound Number: 2 Primary Venous Leg Ulcer Etiology: Wound Location: Right,  Posterior Lower Leg Wound Status: Open Wounding Event: Insect Bite Comorbid Anemia, Sleep Apnea, Hypertension, Peripheral Venous Date Acquired: 11/21/2021 History: Disease Weeks Of Treatment: 29 Clustered Wound: No Photos Wound Measurements Length: (cm) 1.6 Width: (cm) 1.4 Depth: (cm) 0.1 Area: (cm) 1.759 Volume: (cm) 0.176 % Reduction in Area: -433% % Reduction in Volume: -77.8% Epithelialization: Small (1-33%) Tunneling: No Undermining: No Wound Description Classification: Full Thickness Without Exposed Support Wound Margin: Flat and Intact Exudate Amount: Medium Exudate Type: Serosanguineous Exudate Color: red, brown Structures Foul Odor After Cleansing: No Slough/Fibrino Yes Wound Bed Granulation Amount: Large (67-100%) Exposed  [name], [name] ([id number]) [id number] Page 7 of 9 Granulation Quality: Red Fascia Exposed: No Necrotic Amount: Small (1-33%) Fat Layer (Subcutaneous Tissue) Exposed: Yes Necrotic Quality: Adherent Slough Tendon Exposed: No Muscle Exposed: No Joint Exposed: No Bone Exposed: No Periwound Skin Texture Texture Color No Abnormalities Noted: Yes No Abnormalities Noted: No Atrophie Blanche: No Moisture Ecchymosis: No No Abnormalities Noted: Yes Erythema: No Hemosiderin Staining: Yes Rubor: No Temperature / Pain Temperature: No Abnormality Tenderness on Palpation: Yes Treatment Notes Wound #2 (Lower Leg) Wound Laterality: Right, Posterior Cleanser Soap and Water Discharge Instruction: May shower and wash wound with dial antibacterial soap and water prior to dressing change. Wound Cleanser Discharge Instruction: Cleanse the wound with wound cleanser prior to applying a clean dressing using gauze sponges, not tissue or cotton balls. Peri-Wound Care Sween Lotion (Moisturizing lotion) Discharge Instruction: Apply moisturizing lotion as directed Topical Primary Dressing Secondary Dressing Woven  Gauze Sponge, Non-Sterile 4x4 in Discharge Instruction: Apply over primary dressing as directed. Secured With Compression Wrap Urgo K2 Lite, two layer compression system, regular Discharge Instruction: Apply Urgo K2 Lite as directed (alternative to 3 layer compression). Compression Stockings Add-Ons Electronic Signature(s) Signed: 08/16/2022 4:28:56 PM By: [name] RN, BSN Entered By: [name] on 08/16/2022 15:10:11 -------------------------------------------------------------------------------- Wound Assessment Details Patient Name: Date of Service: [place], [name] [name] H. 08/16/2022 2:45 PM Medical Record Number: [id number] Patient Account Number: [id number] Date of Birth/Sex: Treating RN: 21-Aug-1960 (63 y.o. F) Primary Care Bearl Talarico: [name] Other Clinician: Referring Hailey Stormer: Treating Nailah Luepke/Extender: [name] in Treatment: 29 Wound Status Wound Number: 3 Primary Venous Leg Ulcer Etiology: Wound Location: Left, Posterior Lower Leg Wound Status: Open Wounding Event: Trauma Comorbid Anemia, Sleep Apnea, Hypertension, Peripheral Venous Date Acquired: 06/28/2022 History: Disease Weeks Of Treatment: 2 Clustered Wound: No [name], [name] ([id number]) [id number] Page 8 of 9 Photos Wound Measurements Length: (cm) 1.4 Width: (cm) 1 Depth: (cm) 0.2 Area: (cm) 1.1 Volume: (cm) 0.22 % Reduction in Area: 17.1% % Reduction in Volume: -65.4% Epithelialization: None Tunneling: No Undermining: No Wound Description Classification: Full Thickness Without Exposed Support Structures Wound Margin: Flat and Intact Exudate Amount: Medium Exudate Type: Serous Exudate Color: amber Foul Odor After Cleansing: No Slough/Fibrino Yes Wound Bed Granulation Amount: Small (1-33%) Exposed Structure Granulation Quality: Pink Fascia Exposed: No Necrotic Amount: Large (67-100%) Fat Layer (Subcutaneous Tissue)  Exposed: Yes Necrotic Quality: Adherent Slough Tendon Exposed: No Muscle Exposed: No Joint Exposed: No Bone Exposed: No Periwound Skin Texture Texture Color No Abnormalities Noted: Yes No Abnormalities Noted: No Erythema: Yes Moisture Erythema Measurement: Measured No Abnormalities Noted: Yes 1.5 cm Erythema Change: Decreased Temperature / Pain Temperature: No Abnormality Tenderness on Palpation: Yes Treatment Notes Wound #3 (Lower Leg) Wound Laterality: Left, Posterior Cleanser Soap and Water Discharge Instruction: May shower and wash wound with dial antibacterial soap and water prior to dressing change. Wound Cleanser Discharge Instruction: Cleanse the wound with wound cleanser prior to applying a clean dressing using gauze sponges, not tissue or cotton balls. Peri-Wound Care Sween Lotion (Moisturizing lotion) Discharge Instruction: Apply moisturizing lotion as directed Topical Primary Dressing Maxorb Extra Ag+ Alginate Dressing, 2x2 (in/in) Discharge Instruction: Apply to wound bed as instructed Secondary Dressing Woven Gauze Sponge, Non-Sterile 4x4 in Discharge Instruction: Apply over primary dressing as directed. Secured With [name], [name] ([id number]) [id number] Page 9 of 9 Compression Wrap Urgo K2 Lite, two layer compression system, regular Discharge Instruction: Apply Urgo K2 Lite as directed (alternative to 3 layer compression). Compression Stockings  Add-Ons Electronic Signature(s) Signed: 08/16/2022 4:28:56 PM By: [name] RN, BSN Entered By: [name] on 08/16/2022 15:10:40 -------------------------------------------------------------------------------- Vitals Details Patient Name: Date of Service: [id number], [name] [name] H. 08/16/2022 2:45 PM Medical Record Number: [id number] Patient Account Number: [id number] Date of Birth/Sex: Treating RN: 05/22/1960 (62 y.o. F) Primary Care Jaymien Landin: [name] Other  Clinician: Referring Darrie Macmillan: Treating Laaibah Wartman/Extender: [name] in Treatment: 29 Vital Signs Time Taken: 02:54 Temperature (F): 98.2 Height (in): 61 Pulse (bpm): 106 Weight (lbs): 277 Respiratory Rate (breaths/min): 20 Body Mass Index (BMI): 52.3 Blood Pressure (mmHg): 128/83 Reference Range: 80 - 120 mg / dl Electronic Signature(s) Signed: 08/16/2022 3:57:41 PM By: [name] Entered By: [name] on 08/16/2022 14:55:05

## 2022-08-17 NOTE — Progress Notes (Signed)
[name] ([id number]) [id number] Page 1 of 12 Visit Report for 08/16/2022 Chief Complaint Document Details Patient Name: Date of Service: [place] 08/16/2022 2:45 PM Medical Record Number: [id number] Patient Account Number: [id number] Date of Birth/Sex: Treating RN: 22-Mar-1961 (62 y.o. [name] Primary Care Provider: Nira Conn Other Clinician: Referring Provider: Treating Provider/Extender: Andrey Campanile in Treatment: 29 Information Obtained from: Patient Chief Complaint RLE ulcer Electronic Signature(s) Signed: 08/16/2022 3:40:11 PM By: [name] MD FACS Entered By: [name] on 08/16/2022 15:40:11 -------------------------------------------------------------------------------- Debridement Details Patient Name: Date of Service: [name], [name] 08/16/2022 2:45 PM Medical Record Number: [id number] Patient Account Number: [id number] Date of Birth/Sex: Treating RN: 01-Nov-1960 (62 y.o. F) Primary Care Provider: Nira Conn Other Clinician: Referring Provider: Treating Provider/Extender: Andrey Campanile in Treatment: 29 Debridement Performed for Assessment: Wound #2 Right,Posterior Lower Leg Performed By: Physician [name], MD Debridement Type: Debridement Severity of Tissue Pre Debridement: Fat layer exposed Level of Consciousness (Pre-procedure): Awake and Alert Pre-procedure Verification/Time Out Yes - 15:15 Taken: Start Time: 15:15 Pain Control: Lidocaine 4% Topical Solution Percent of Wound Bed Debrided: 100% T Area Debrided (cm): otal 1.76 Tissue and other material debrided: Non-Viable, Eschar, Slough, Slough Level: Non-Viable Tissue Debridement Description: Selective/Open Wound Instrument: Curette Bleeding: Minimum Hemostasis Achieved: Pressure Procedural Pain: 0 Post Procedural Pain: 0 Response to Treatment: Procedure was  tolerated well Level of Consciousness (Post- Awake and Alert procedure): Post Debridement Measurements of Total Wound Length: (cm) 1.6 Width: (cm) 1.4 Depth: (cm) 0.1 Volume: (cm) 0.176 Character of Wound/Ulcer Post Debridement: Improved Severity of Tissue Post Debridement: Fat layer exposed Post Procedure Diagnosis Same as Pre-procedure Notes scribed for Dr. Lady Gary by [name] Deed, RN Electronic Signature(s) [name] ([id number]) [phone].pdf Page 2 of 12 Signed: 08/16/2022 3:57:41 PM By: [name] Signed: 08/16/2022 4:20:07 PM By: [name] MD FACS Entered By: [name] on 08/16/2022 15:17:53 -------------------------------------------------------------------------------- Debridement Details Patient Name: Date of Service: [address] [name] 08/16/2022 2:45 PM Medical Record Number: [id number] Patient Account Number: [id number] Date of Birth/Sex: Treating RN: 1960-10-22 (62 y.o. F) Primary Care Provider: Nira Conn Other Clinician: Referring Provider: Treating Provider/Extender: Andrey Campanile in Treatment: 29 Debridement Performed for Assessment: Wound #3 Left,Posterior Lower Leg Performed By: Physician [name], MD Debridement Type: Debridement Severity of Tissue Pre Debridement: Fat layer exposed Level of Consciousness (Pre-procedure): Awake and Alert Pre-procedure Verification/Time Out Yes - 15:15 Taken: Start Time: 15:15 Pain Control: Lidocaine 4% T opical Solution Percent of Wound Bed Debrided: 100% T Area Debrided (cm): otal 1.1 Tissue and other material debrided: Non-Viable, Slough, Slough Level: Non-Viable Tissue Debridement Description: Selective/Open Wound Instrument: Curette Bleeding: Minimum Hemostasis Achieved: Pressure Procedural Pain: 0 Post Procedural Pain: 0 Response to Treatment: Procedure was tolerated well Level of Consciousness (Post- Awake and  Alert procedure): Post Debridement Measurements of Total Wound Length: (cm) 1.4 Width: (cm) 1 Depth: (cm) 0.2 Volume: (cm) 0.22 Character of Wound/Ulcer Post Debridement: Improved Severity of Tissue Post Debridement: Fat layer exposed Post Procedure Diagnosis Same as Pre-procedure Notes scribed for Dr. Lady Gary by [name] Deed, RN Electronic Signature(s) Signed: 08/16/2022 3:57:41 PM By: [name] Signed: 08/16/2022 4:20:07 PM By: [name] MD FACS Entered By: [name] on 08/16/2022 15:18:34 -------------------------------------------------------------------------------- HPI Details Patient Name: Date of Service: [name], [name] 08/16/2022 2:45 PM Medical Record Number: [id number] Patient Account Number: [id number] Date of Birth/Sex: Treating RN: 01-Oct-1960 (62 y.o. F) [name]  Primary Care Provider: Nira Conn Other Clinician: Referring Provider: Treating Provider/Extender: Andrey Campanile in Treatment: 29 History of Present Illness HPI Description: 02/16/2020 upon evaluation today patient actually appears to be doing poorly in regard to her left medial lower extremity ulcer. This is actually an area that she tells me she has had intermittent issues with over the years although has been closed for some time she typically uses compression right now she has juxta lite compression wraps. With that being said she tells me that this nonetheless open several weeks/months ago and has been given her [name] ([id number]) [id number] Page 3 of 12 trouble since. She does have a history of chronic venous insufficiency she is seeing specialist for this in the past she has had an ablation as well as sclerotherapy. With that being said she also has hypertension chronically which is managed by her primary care provider. In general she seems to be worsening overall with regard to the wound and states that she  finally realized that she needed to come in and have somebody look at this and not continue to try to manage this on her own. No fevers, chills, nausea, vomiting, or diarrhea. 02/23/2020 on evaluation today patient appears to be doing well with regard to her wound. This is showing some signs of improvement which is great news still were not quite at the point where I would like to be as far as the overall appearance of the wound is concerned but I do believe this is better than last week. I do believe the Iodoflex is helping as well. 03/08/2020 upon evaluation today patient appears to be doing well with regard to her wound. She has been tolerating the dressing changes without complication. Fortunately I feel like she has made great progress with the Iodoflex but I feel like it may be the point rest to switch to something else possibly a collagen- based dressing at this time. 03/15/2020 upon evaluation today patient appears to be doing excellent in regard to her leg ulcer. She has been tolerating the dressing changes without complication. Fortunately there is no signs of active infection. Overall she is measuring a little bit smaller today which is great news. 03/22/2020 upon evaluation today patient appears to be doing well with regard to her wound. She has been tolerating the dressing changes without complication. Fortunately there is no signs of active infection at this time. 03/28/2020; patient I do not usually see however she has a wound on the left anterior lower leg secondary to chronic venous insufficiency we have been using silver collagen under compression. She arrives in clinic with a nonviable surface requiring debridement 04/12/2020 upon evaluation today patient appears to be doing well all things considered with regard to her leg ulcer. She is tolerating the dressing changes without complication there is minimal dry skin around the edges of the wound that may be trapping and stopping some of  the events of the new skin I am can work on that today. Otherwise the surface of the wound appears to be doing excellent. 04/19/2020 upon evaluation today patient appears to be doing well with regard to her leg ulcer. She has been tolerating dressing changes without complication. Fortunately there is no signs of active infection at this time. No fever chills noted overall very pleased with how things seem to be progressing. 04/26/2020 on evaluation today patient appears to be doing well with regard to her wound currently. Is showing signs of excellent improvement overall is  filling in nicely and there does not appear to be any signs of infection. No fevers, chills, nausea, vomiting, or diarrhea. 05/03/2020 upon evaluation today patient appears to be doing well with regard to her wound on the leg. This overall showing signs of good improvement which is great she has some good epithelial growth and overall I think that things are moving in the correct direction. We likewise going to continue with the wound care measures as before since she seems to making such good improvement. 2/3; venous wound on the left medial leg. This is contracting. We are using Prisma and 3 layer compression. She has a stocking and waiting in the eventuality this heals. She is already using it on the right 05/17/2020 upon evaluation today patient appears to be doing well with regard to her leg ulcer. She has been tolerating the dressing changes without complication. Fortunately there is no signs of active infection which is great news and overall very pleased with where things stand today. No fevers, chills, nausea, vomiting, or diarrhea. 05/24/2020 upon evaluation today patient appears to be doing well with regard to her wound. Overall I feel like she is making excellent progress. There does not appear to be any signs of active infection which is great news. 05/31/2020 upon evaluation today patient appears to be doing well with  regard to her wound. There does not appear to be any signs of active infection which is great news overall I am extremely pleased with where things stand today. READMISSION 01/23/2022 She returns to clinic today with a new wound on her right posterior calf. She says that she was cleaning out an old shed near the middle of August this year and then noticed what seemed to be a bug bite on her right posterior calf. It was itchy, red, and raised. By the end of September, an ulcer had developed. She has been applying various topical creams such as hydrocortisone and others to the site. When it was not improving, she made an appointment in the wound care center. ABI in clinic today was 0.97. On her right posterior calf, there is a circular wound with necrotic fat and black eschar present. There is no purulent drainage or malodor. The periwound skin is in good condition with just a little induration that appears to be secondary to inflammation. 01/31/2022: The wound measures a little bit larger today, but overall is quite a bit cleaner. There is some undermining from 9:00 to 3:00. She is having some periwound itching and says that her wrap slid. 02/08/2022: Despite using an Unna boot first layer at the top of her wrap, it slid again and it looks like there is been some bruising at the wound site. The wound is about the same size in terms of dimensions. There is a fair amount of slough and nonviable tissue still present. Edema control is better than last week. 02/15/2022: The wound dimensions are about the same. There is less nonviable tissue present. The periwound erythema has improved. 02/22/2022: The wound has deteriorated over the past week. It is larger and the periwound is more edematous, erythematous, and indurated. It looks as though there has been more tissue breakdown with undermining present. She is having more pain. There is a foul odor coming from the wound. 02/26/2022: The wound looks quite a  bit better today and the odor is gone. I still do not have her culture data back, but she seems to be responding well to the Augmentin. 03/06/2022: Her wound continues  to improve. There is still some slough accumulation, but the periwound skin is less inflamed. Her culture returned with a polymicrobial population including Pseudomonas and so levofloxacin was also prescribed. She did not understand why a second antibiotic was being added so she has not yet initiated this. 03/14/2022: The wound is about the same, to perhaps a little bit larger. There is a fair amount of slough accumulation on the surface, as well as some hypertrophic granulation tissue. She is still taking levofloxacin, but has completed taking Augmentin. She has her Jodie Echevaria compound with her today. 12/14; the patient has a significant circular wound on the posterior right calf. She is using Keystone and silver alginate under 3 layer compression. Her ABIs were within normal limits at 0.97. This may have been traumatic or an insect bite at the start I reviewed these records. 04/04/2022: Since I last saw the wound, it has contracted considerably. There is a fairly thick layer of slough on the surface, as it has not been debrided since the last time I did it. Edema control is excellent and the periwound skin is in much better condition. 04/12/2022: No significant change in the wound dimensions. It is filling with granulation tissue. Still with slough accumulation on the surface. 04/19/2022: The wound is smaller this week. There is some slough accumulation on the surface. 04/26/2022: The wound is smaller again this week and significantly cleaner. The wound surface is a little bit drier than ideal. 05/03/2022: The wound measurements were about the same, but visually it appears smaller. There is still some undermining at the top of the wound. Moisture balance is better this week. 05/10/2022: The wound measured smaller today. There is still a fair  amount of undermining present. Slough has built up on the surface. We are still awaiting snap ROSELYN, VALENSUELA ([id number]) [id number] Page 4 of 12 VAC approval. 05/17/2022: The wound is a little bit smaller today. There is less slough on the surface, but the granulation tissue still is not very robust. She has been approved for snap VAC but will have a 20% coinsurance and it is not clear what that amount would end up being for her. 05/27/2022: The surface of the wound has deteriorated and it is gray and fibrotic. No significant odor, but the drainage on her dressing was a little bit purulent. 05/31/2022: The wound looks quite a bit better today. It is still a little fibrotic but no longer has purulent-looking drainage. The color is better. She spoke with her insurance company and is interested in trying the snap VAC, now that she is aware of the cost to her. 06/07/2022: After 1 week in the snap VAC, there has been substantial improvement to her wound. The undermined portion is closing in. The surface has a healthier color and appearance. There is very minimal slough accumulation. 06/14/2022: The more shallow part of the undermined portion of her wound has closed. There is still some undermining from about 1-2 o'clock. The surface continues to improve. Minimal slough accumulation. 06/28/2022: The wound is shallower this week and the undermining has essentially closed and completely. The surface tissue is improving. 07/05/2022: The depth is almost immeasurable and the surface is nearly flush with the surrounding skin. The undermining has been eliminated. There is light slough on the wound surface. 07/12/2022: There is a little bit of slough on the wound surface. The depth is about the same as last week. 07/19/2022: The wound is essentially flush with the surrounding skin surface. There is slight slough  present. No real change in the AP or transverse dimensions. 07/26/2022: The wound  measured a little bit smaller today. It is flush with the surrounding skin surface and there is good granulation tissue present. Minimal slough and biofilm accumulation. 08/02/2022: The right posterior leg wound is a little bit smaller. There is a little slough accumulation. She reported a new wound to [place] today. It is on her left posterior calf. It has been present for about 6 weeks. She is not sure of how it occurred. She has been trying to manage it at home with topical Neosporin and Band-Aids. The fat layer is exposed. The surface is fibrotic and covered with slough. 08/09/2022: The right posterior leg wound is smaller again this week. There is good granulation tissue on the surface with minimal slough accumulation. The left posterior calf wound has built up a thick layer of slough. It is still quite fibrotic underneath the slough, but there is a little bit of a pink color beginning to emerge. Edema control is good. 08/16/2022: Both wounds are smaller this week. There is minimal slough on the right posterior leg wound. There is slough that has reaccumulated on the left posterior calf wound but there is a little bit more granulation tissue emerging. Electronic Signature(s) Signed: 08/16/2022 3:41:00 PM By: [name] MD FACS Entered By: [name] on 08/16/2022 15:41:00 -------------------------------------------------------------------------------- Physical Exam Details Patient Name: Date of Service: [name], [name] 08/16/2022 2:45 PM Medical Record Number: [id number] Patient Account Number: [id number] Date of Birth/Sex: Treating RN: May 29, 1960 (62 y.o. [name] Primary Care Provider: Nira Conn Other Clinician: Referring Provider: Treating Provider/Extender: Andrey Campanile in Treatment: 29 Constitutional . Slightly tachycardic. . . no acute distress. Respiratory Normal work of breathing on room air. Notes 08/16/2022: Both wounds are  smaller this week. There is minimal slough on the right posterior leg wound. There is slough that has reaccumulated on the left posterior calf wound but there is a little bit more granulation tissue emerging. Electronic Signature(s) Signed: 08/16/2022 3:43:04 PM By: [name] MD FACS Entered By: [name] on 08/16/2022 15:43:04 -------------------------------------------------------------------------------- Physician Orders Details Patient Name: Date of Service: [address] [name] 08/16/2022 2:45 PM Medical Record Number: [id number] Patient Account Number: [id number] Date of Birth/Sex: Treating RN: 01-05-1961 (62 y.o. 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 - Dr. Lady Gary RM 1 Return A Friday 5/17 @ 1:15 pm Anesthetic (In clinic) Topical Lidocaine 4% applied to wound bed Bathing/ Shower/ Hygiene May shower with protection but do not get wound dressing(s) wet. Protect dressing(s) with water repellant cover (for example, large plastic bag) or a cast cover and may then take shower. Edema Control - Lymphedema / SCD / Other Bilateral Lower Extremities Elevate legs to the level of the heart or above for 30 minutes daily and/or when sitting for 3-4 times a day throughout the day. Avoid standing for long periods of time. Exercise regularly Moisturize legs daily. Wound Treatment Wound  #2 - Lower Leg Wound Laterality: Right, Posterior Cleanser: Soap  and Water 1 x Per Week/30 Days Discharge Instructions: May shower and wash wound with dial antibacterial soap and water prior to dressing change. Cleanser: Wound Cleanser 1 x Per Week/30 Days Discharge Instructions: Cleanse the wound with wound cleanser prior to applying a clean dressing using gauze sponges, not tissue or cotton balls. Peri-Wound Care: Sween Lotion (Moisturizing lotion) 1 x Per Week/30 Days Discharge Instructions: Apply moisturizing lotion as directed Secondary Dressing: Woven Gauze Sponge, Non-Sterile 4x4 in 1 x Per Week/30 Days Discharge Instructions: Apply over primary dressing as directed. Compression Wrap: Urgo K2 Lite, two layer compression system, regular 1 x Per Week/30 Days Discharge Instructions: Apply Urgo K2 Lite as directed (alternative to 3 layer compression). Wound #3 - Lower Leg Wound Laterality: Left, Posterior Cleanser: Soap and Water 1 x Per Week/30 Days Discharge Instructions: May shower and wash wound with dial antibacterial soap and water prior to dressing change. Cleanser: Wound Cleanser 1 x Per Week/30 Days Discharge Instructions: Cleanse the wound with wound cleanser prior to applying a clean dressing using gauze sponges, not tissue or cotton balls. Peri-Wound Care: Sween Lotion (Moisturizing lotion) 1 x Per Week/30 Days Discharge Instructions: Apply moisturizing lotion as directed Prim Dressing: Maxorb Extra Ag+ Alginate Dressing, 2x2 (in/in) 1 x Per Week/30 Days ary Discharge Instructions: Apply to wound bed as instructed Secondary Dressing: Woven Gauze Sponge, Non-Sterile 4x4 in 1 x Per Week/30 Days Discharge Instructions: Apply over primary dressing as directed. Compression Wrap: Urgo K2 Lite, two layer compression system, regular 1 x Per Week/30 Days Discharge Instructions: Apply Urgo K2 Lite as directed (alternative to 3 layer compression). Electronic Signature(s) Signed:  08/16/2022 4:20:07 PM By: [name] MD FACS Entered By: [name] on 08/16/2022 15:43:43 [name] ([id number]) [id number] Page 6 of 12 -------------------------------------------------------------------------------- Problem List Details Patient Name: Date of Service: [name] 08/16/2022 2:45 PM Medical Record Number: [id number] Patient Account Number: [id number] Date of Birth/Sex: Treating RN: 04-10-1960 (62 y.o. F) Primary Care Provider: Nira Conn Other Clinician: Referring Provider: Treating Provider/Extender: Andrey Campanile in Treatment: 29 Active Problems ICD-10 Encounter Code Description Active Date MDM Diagnosis L97.212 Non-pressure chronic ulcer of right calf with fat layer exposed 01/23/2022 No Yes L97.222 Non-pressure chronic ulcer of left calf with fat layer exposed 08/02/2022 No Yes E66.01 Morbid (severe) obesity due to excess calories 01/23/2022 No Yes I10 Essential (primary) hypertension 01/23/2022 No Yes R60.0 Localized edema 01/23/2022 No Yes Inactive Problems Resolved Problems Electronic Signature(s) Signed: 08/16/2022 3:38:14 PM By: [name] MD FACS Entered By: [name] on 08/16/2022 15:38:14 -------------------------------------------------------------------------------- Progress Note Details Patient Name: Date of Service: [name], [name] 08/16/2022 2:45 PM Medical Record Number: [id number] Patient Account Number: [id number] Date of Birth/Sex: Treating RN: 11-28-1960 (62 y.o. [name] Primary Care Provider: Nira Conn Other Clinician: Referring Provider: Treating Provider/Extender: Andrey Campanile in Treatment: 29 Subjective Chief Complaint Information obtained from Patient RLE ulcer History of Present Illness (HPI) 02/16/2020 upon evaluation today patient actually appears to be doing poorly in regard to her  left medial lower extremity ulcer. This is actually an area that she tells me she has had intermittent issues with over the years although has been closed for some time she typically uses compression right now she has juxta lite compression wraps. With that being said she tells me that this nonetheless open several weeks/months ago and has been given her trouble since. She does have a history of chronic venous insufficiency she is  seeing specialist for this in the past she has had an ablation as well as sclerotherapy. With that being said she also has hypertension chronically which is managed by her primary care provider. In general she seems to be worsening overall with regard to the wound and states that she finally realized that she needed to come in and have somebody look at this and not continue to try to manage this on her own. No fevers, chills, nausea, vomiting, or diarrhea. 02/23/2020 on evaluation today patient appears to be doing well with regard to her wound. This is showing some signs of improvement which is great news still were not quite at the point where I would like to be as far as the overall appearance of the wound is concerned but I do believe this is better than last week. I do believe the Iodoflex is helping as well. 03/08/2020 upon evaluation today patient appears to be doing well with regard to her wound. She has been tolerating the dressing changes without complication. MAEGHEN, GENT ([id number]) [id number] Page 7 of 12 Fortunately I feel like she has made great progress with the Iodoflex but I feel like it may be the point rest to switch to something else possibly a collagen- based dressing at this time. 03/15/2020 upon evaluation today patient appears to be doing excellent in regard to her leg ulcer. She has been tolerating the dressing changes without complication. Fortunately there is no signs of active infection. Overall she is measuring a  little bit smaller today which is great news. 03/22/2020 upon evaluation today patient appears to be doing well with regard to her wound. She has been tolerating the dressing changes without complication. Fortunately there is no signs of active infection at this time. 03/28/2020; patient I do not usually see however she has a wound on the left anterior lower leg secondary to chronic venous insufficiency we have been using silver collagen under compression. She arrives in clinic with a nonviable surface requiring debridement 04/12/2020 upon evaluation today patient appears to be doing well all things considered with regard to her leg ulcer. She is tolerating the dressing changes without complication there is minimal dry skin around the edges of the wound that may be trapping and stopping some of the events of the new skin I am can work on that today. Otherwise the surface of the wound appears to be doing excellent. 04/19/2020 upon evaluation today patient appears to be doing well with regard to her leg ulcer. She has been tolerating dressing changes without complication. Fortunately there is no signs of active infection at this time. No fever chills noted overall very pleased with how things seem to be progressing. 04/26/2020 on evaluation today patient appears to be doing well with regard to her wound currently. Is showing signs of excellent improvement overall is filling in nicely and there does not appear to be any signs of infection. No fevers, chills, nausea, vomiting, or diarrhea. 05/03/2020 upon evaluation today patient appears to be doing well with regard to her wound on the leg. This overall showing signs of good improvement which is great she has some good epithelial growth and overall I think that things are moving in the correct direction. We likewise going to continue with the wound care measures as before since she seems to making such good improvement. 2/3; venous wound on the left medial  leg. This is contracting. We are using Prisma and 3 layer compression. She has a stocking and waiting in the  eventuality this heals. She is already using it on the right 05/17/2020 upon evaluation today patient appears to be doing well with regard to her leg ulcer. She has been tolerating the dressing changes without complication. Fortunately there is no signs of active infection which is great news and overall very pleased with where things stand today. No fevers, chills, nausea, vomiting, or diarrhea. 05/24/2020 upon evaluation today patient appears to be doing well with regard to her wound. Overall I feel like she is making excellent progress. There does not appear to be any signs of active infection which is great news. 05/31/2020 upon evaluation today patient appears to be doing well with regard to her wound. There does not appear to be any signs of active infection which is great news overall I am extremely pleased with where things stand today. READMISSION 01/23/2022 She returns to clinic today with a new wound on her right posterior calf. She says that she was cleaning out an old shed near the middle of August this year and then noticed what seemed to be a bug bite on her right posterior calf. It was itchy, red, and raised. By the end of September, an ulcer had developed. She has been applying various topical creams such as hydrocortisone and others to the site. When it was not improving, she made an appointment in the wound care center. ABI in clinic today was 0.97. On her right posterior calf, there is a circular wound with necrotic fat and black eschar present. There is no purulent drainage or malodor. The periwound skin is in good condition with just a little induration that appears to be secondary to inflammation. 01/31/2022: The wound measures a little bit larger today, but overall is quite a bit cleaner. There is some undermining from 9:00 to 3:00. She is having some periwound itching and  says that her wrap slid. 02/08/2022: Despite using an Unna boot first layer at the top of her wrap, it slid again and it looks like there is been some bruising at the wound site. The wound is about the same size in terms of dimensions. There is a fair amount of slough and nonviable tissue still present. Edema control is better than last week. 02/15/2022: The wound dimensions are about the same. There is less nonviable tissue present. The periwound erythema has improved. 02/22/2022: The wound has deteriorated over the past week. It is larger and the periwound is more edematous, erythematous, and indurated. It looks as though there has been more tissue breakdown with undermining present. She is having more pain. There is a foul odor coming from the wound. 02/26/2022: The wound looks quite a bit better today and the odor is gone. I still do not have her culture data back, but she seems to be responding well to the Augmentin. 03/06/2022: Her wound continues to improve. There is still some slough accumulation, but the periwound skin is less inflamed. Her culture returned with a polymicrobial population including Pseudomonas and so levofloxacin was also prescribed. She did not understand why a second antibiotic was being added so she has not yet initiated this. 03/14/2022: The wound is about the same, to perhaps a little bit larger. There is a fair amount of slough accumulation on the surface, as well as some hypertrophic granulation tissue. She is still taking levofloxacin, but has completed taking Augmentin. She has her Jodie Echevaria compound with her today. 12/14; the patient has a significant circular wound on the posterior right calf. She is using [place] and  silver alginate under 3 layer compression. Her ABIs were within normal limits at 0.97. This may have been traumatic or an insect bite at the start I reviewed these records. 04/04/2022: Since I last saw the wound, it has contracted considerably. There is  a fairly thick layer of slough on the surface, as it has not been debrided since the last time I did it. Edema control is excellent and the periwound skin is in much better condition. 04/12/2022: No significant change in the wound dimensions. It is filling with granulation tissue. Still with slough accumulation on the surface. 04/19/2022: The wound is smaller this week. There is some slough accumulation on the surface. 04/26/2022: The wound is smaller again this week and significantly cleaner. The wound surface is a little bit drier than ideal. 05/03/2022: The wound measurements were about the same, but visually it appears smaller. There is still some undermining at the top of the wound. Moisture balance is better this week. 05/10/2022: The wound measured smaller today. There is still a fair amount of undermining present. Slough has built up on the surface. We are still awaiting snap VAC approval. 05/17/2022: The wound is a little bit smaller today. There is less slough on the surface, but the granulation tissue still is not very robust. She has been approved for snap VAC but will have a 20% coinsurance and it is not clear what that amount would end up being for her. 05/27/2022: The surface of the wound has deteriorated and it is gray and fibrotic. No significant odor, but the drainage on her dressing was a little bit purulent. 05/31/2022: The wound looks quite a bit better today. It is still a little fibrotic but no longer has purulent-looking drainage. The color is better. She spoke with her insurance company and is interested in trying the snap VAC, now that she is aware of the cost to her. [name], [name] ([id number]) [id number] Page 8 of 12 06/07/2022: After 1 week in the snap VAC, there has been substantial improvement to her wound. The undermined portion is closing in. The surface has a healthier color and appearance. There is very minimal slough accumulation. 06/14/2022: The  more shallow part of the undermined portion of her wound has closed. There is still some undermining from about 1-2 o'clock. The surface continues to improve. Minimal slough accumulation. 06/28/2022: The wound is shallower this week and the undermining has essentially closed and completely. The surface tissue is improving. 07/05/2022: The depth is almost immeasurable and the surface is nearly flush with the surrounding skin. The undermining has been eliminated. There is light slough on the wound surface. 07/12/2022: There is a little bit of slough on the wound surface. The depth is about the same as last week. 07/19/2022: The wound is essentially flush with the surrounding skin surface. There is slight slough present. No real change in the AP or transverse dimensions. 07/26/2022: The wound measured a little bit smaller today. It is flush with the surrounding skin surface and there is good granulation tissue present. Minimal slough and biofilm accumulation. 08/02/2022: The right posterior leg wound is a little bit smaller. There is a little slough accumulation. She reported a new wound to [place] today. It is on her left posterior calf. It has been present for about 6 weeks. She is not sure of how it occurred. She has been trying to manage it at home with topical Neosporin and Band-Aids. The fat layer is exposed. The surface is fibrotic and covered with slough.  08/09/2022: The right posterior leg wound is smaller again this week. There is good granulation tissue on the surface with minimal slough accumulation. The left posterior calf wound has built up a thick layer of slough. It is still quite fibrotic underneath the slough, but there is a little bit of a pink color beginning to emerge. Edema control is good. 08/16/2022: Both wounds are smaller this week. There is minimal slough on the right posterior leg wound. There is slough that has reaccumulated on the left posterior calf wound but there is a little bit more  granulation tissue emerging. Patient History Information obtained from Patient. Family History Cancer - Mother, Diabetes - Father, Hypertension - Mother, Kidney Disease - Mother, Lung Disease - Father, Thyroid Problems - Paternal Grandparents, No family history of Heart Disease, Seizures, Stroke, Tuberculosis. Social History Never smoker, Marital Status - Married, Alcohol Use - Never, Drug Use - No History, Caffeine Use - Daily - coffee. Medical History Eyes Denies history of Cataracts, Glaucoma, Optic Neuritis Ear/Nose/Mouth/Throat Denies history of Chronic sinus problems/congestion, Middle ear problems Hematologic/Lymphatic Patient has history of Anemia - iron Denies history of Hemophilia, Human Immunodeficiency Virus, Lymphedema, Sickle Cell Disease Respiratory Patient has history of Sleep Apnea - CPAP Denies history of Aspiration, Asthma, Chronic Obstructive Pulmonary Disease (COPD), Pneumothorax, Tuberculosis Cardiovascular Patient has history of Hypertension, Peripheral Venous Disease Denies history of Angina, Arrhythmia, Congestive Heart Failure, Coronary Artery Disease, Deep Vein Thrombosis, Hypotension, Myocardial Infarction, Peripheral Arterial Disease, Phlebitis, Vasculitis Gastrointestinal Denies history of Cirrhosis , Colitis, Crohnoos, Hepatitis A, Hepatitis B, Hepatitis C Endocrine Denies history of Type I Diabetes, Type II Diabetes Genitourinary Denies history of End Stage Renal Disease Immunological Denies history of Lupus Erythematosus, Raynaudoos, Scleroderma Integumentary (Skin) Denies history of History of Burn Musculoskeletal Denies history of Gout, Rheumatoid Arthritis, Osteoarthritis, Osteomyelitis Neurologic Denies history of Dementia, Neuropathy, Quadriplegia, Paraplegia, Seizure Disorder Oncologic Denies history of Received Chemotherapy, Received Radiation Psychiatric Denies history of Anorexia/bulimia, Confinement Anxiety Hospitalization/Surgery  History - cholecystectomy 1980s. - nephrolithasis 1980s. - plate and rod in right elbow surgery 2010. 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 no acute distress. Vitals Time Taken: 2:54 AM, Height: 61 in, Weight: 277 lbs, BMI: 52.3, Temperature: 98.2 F, Pulse: 106 bpm, Respiratory Rate: 20 breaths/min, Blood Pressure: 128/83 mmHg. Respiratory Normal work of breathing on room air. General Notes: 08/16/2022: Both wounds are smaller this week. There is minimal slough on the right posterior leg wound. There is slough that has reaccumulated on the left posterior calf wound but there is a little bit more granulation tissue emerging. Integumentary (Hair, Skin) Wound #2 status is Open. Original cause of wound was Insect Bite. The date acquired was: 11/21/2021. The wound has been in treatment 29 weeks. The wound is located on the Right,Posterior Lower Leg. The wound measures 1.6cm length x 1.4cm width x 0.1cm depth; 1.759cm^2 area and 0.176cm^3 volume. There is Fat Layer (Subcutaneous Tissue) exposed. There is no tunneling or undermining noted. There is a medium amount of serosanguineous drainage noted. The wound margin is flat and intact. There is large (67-100%) red granulation within the wound bed. There is a small (1-33%) amount of necrotic tissue within the wound bed including Adherent Slough. The periwound skin appearance had no abnormalities noted for texture. The periwound skin appearance had no abnormalities noted for moisture. The periwound skin appearance exhibited: Hemosiderin  Staining. The periwound skin appearance did not exhibit: Atrophie Blanche, Ecchymosis, Rubor, Erythema. Periwound temperature was noted as No Abnormality. The periwound has tenderness on palpation. Wound #3  status is Open. Original cause of wound was Trauma. The date acquired was: 06/28/2022. The wound has been in treatment 2 weeks. The wound is located on the Left,Posterior Lower Leg. The wound measures 1.4cm length x 1cm width x 0.2cm depth; 1.1cm^2 area and 0.22cm^3 volume. There is Fat Layer (Subcutaneous Tissue) exposed. There is no tunneling or undermining noted. There is a medium amount of serous drainage noted. The wound margin is flat and intact. There is small (1-33%) pink granulation within the wound bed. There is a large (67-100%) amount of necrotic tissue within the wound bed including Adherent Slough. The periwound skin appearance had no abnormalities noted for texture. The periwound skin appearance had no abnormalities noted for moisture. The periwound skin appearance exhibited: Erythema. The surrounding wound skin color is noted with erythema. Erythema is measured at 1.5 cm. Periwound temperature was noted as No Abnormality. The periwound has tenderness on palpation. 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 There was a Selective/Open Wound Non-Viable Tissue Debridement with a total area of 1.76 sq cm performed by [name], MD. With the following instrument(s): Curette to remove Non-Viable tissue/material. Material removed includes Eschar and Slough and after achieving pain control using Lidocaine 4% T opical Solution. No specimens were taken. A time out was conducted at 15:15, prior to the start of the procedure. A Minimum amount of bleeding was controlled with Pressure. The procedure was  tolerated well with a pain level of 0 throughout and a pain level of 0 following the procedure. Post Debridement Measurements: 1.6cm length x 1.4cm width x 0.1cm depth; 0.176cm^3 volume. Character of Wound/Ulcer Post Debridement is improved. Severity of Tissue Post Debridement is: Fat layer exposed. Post procedure Diagnosis Wound #2: Same as Pre-Procedure General Notes: scribed for Dr. Lady Gary by [name] Deed, RN. Pre-procedure diagnosis of Wound #2 is a Venous Leg Ulcer located on the Right,Posterior Lower Leg . There was a Three Layer Compression Therapy Procedure Post procedure Diagnosis Wound #2: Same as Pre-Procedure Wound #3 Pre-procedure diagnosis of Wound #3 is a Venous Leg Ulcer located on the Left,Posterior Lower Leg .Severity of Tissue Pre Debridement is: Fat layer exposed. There was a Selective/Open Wound Non-Viable Tissue Debridement with a total area of 1.1 sq cm performed by [name], MD. With the following instrument(s): Curette to remove Non-Viable tissue/material. Material removed includes [facility] after achieving pain control using Lidocaine 4% Topical Solution. No specimens were taken. A time out was conducted at 15:15, prior to the start of the procedure. A Minimum amount of bleeding was controlled with Pressure. The procedure was tolerated well with a pain level of 0 throughout and a pain level of 0 following the procedure. Post Debridement Measurements: 1.4cm length x 1cm width x 0.2cm depth; 0.22cm^3 volume. Character of Wound/Ulcer Post Debridement is improved. Severity of Tissue Post Debridement is: Fat layer exposed. Post procedure Diagnosis Wound #3: Same as Pre-Procedure General Notes: scribed for Dr. Lady Gary by [name] Deed, RN. Pre-procedure diagnosis of Wound #3 is a Venous Leg  Ulcer located on the Left,Posterior Lower Leg . There was a Three Layer Compression Therapy Procedure Post procedure Diagnosis Wound #3: Same as Pre-Procedure [name], [name]  ([id number]) [id number] Page 10 of 12 Plan Follow-up Appointments: Return Appointment in 1 week. - Dr. Lady Gary RM 1 Friday 5/17 @ 1:15 pm Anesthetic: (In clinic) Topical Lidocaine 4% applied to wound bed Bathing/ Shower/ Hygiene: May shower with protection but do not get wound dressing(s) wet. Protect dressing(s) with water repellant cover (for example, large plastic bag) or a cast cover and may then take shower. Edema Control - Lymphedema / SCD / Other: Elevate legs to the level of the heart or above for 30 minutes daily and/or when sitting for 3-4 times a day throughout the day. Avoid standing for long periods of time. Exercise regularly Moisturize legs daily. WOUND #2: - Lower Leg Wound Laterality: Right, Posterior Cleanser: Soap and Water 1 x Per Week/30 Days Discharge Instructions: May shower and wash wound with dial antibacterial soap and water prior to dressing change. Cleanser: Wound Cleanser 1 x Per Week/30 Days Discharge Instructions: Cleanse the wound with wound cleanser prior to applying a clean dressing using gauze sponges, not tissue or cotton balls. Peri-Wound Care: Sween Lotion (Moisturizing lotion) 1 x Per Week/30 Days Discharge Instructions: Apply moisturizing lotion as directed Secondary Dressing: Woven Gauze Sponge, Non-Sterile 4x4 in 1 x Per Week/30 Days Discharge Instructions: Apply over primary dressing as directed. Com pression Wrap: Urgo K2 Lite, two layer compression system, regular 1 x Per Week/30 Days Discharge Instructions: Apply Urgo K2 Lite as directed (alternative to 3 layer compression). WOUND #3: - Lower Leg Wound Laterality: Left, Posterior Cleanser: Soap and Water 1 x Per Week/30 Days Discharge Instructions: May shower and wash wound with dial antibacterial soap and water prior to dressing change. Cleanser: Wound Cleanser 1 x Per Week/30 Days Discharge Instructions: Cleanse the wound with wound cleanser prior to applying a  clean dressing using gauze sponges, not tissue or cotton balls. Peri-Wound Care: Sween Lotion (Moisturizing lotion) 1 x Per Week/30 Days Discharge Instructions: Apply moisturizing lotion as directed Prim Dressing: Maxorb Extra Ag+ Alginate Dressing, 2x2 (in/in) 1 x Per Week/30 Days ary Discharge Instructions: Apply to wound bed as instructed Secondary Dressing: Woven Gauze Sponge, Non-Sterile 4x4 in 1 x Per Week/30 Days Discharge Instructions: Apply over primary dressing as directed. Com pression Wrap: Urgo K2 Lite, two layer compression system, regular 1 x Per Week/30 Days Discharge Instructions: Apply Urgo K2 Lite as directed (alternative to 3 layer compression). 08/16/2022: Both wounds are smaller this week. There is minimal slough on the right posterior leg wound. There is slough that has reaccumulated on the left posterior calf wound but there is a little bit more granulation tissue emerging. I used a curette to debride slough from both of the wounds. We will continue silver alginate and bilateral 3 layer equivalent wrap. Follow-up in 1 week. Electronic Signature(s) Signed: 08/16/2022 3:44:25 PM By: [name] MD FACS Entered By: [name] on 08/16/2022 15:44:25 -------------------------------------------------------------------------------- HxROS Details Patient Name: Date of Service: [address], [name] 08/16/2022 2:45 PM Medical Record Number: [id number] Patient Account Number: [id number] Date of Birth/Sex: Treating RN: 05-16-1960 (62 y.o. [name] Primary Care Provider: Nira Conn Other Clinician: Referring Provider: Treating Provider/Extender: Andrey Campanile in Treatment: 29 Information Obtained From Patient Eyes Medical History: Negative for: Cataracts; Glaucoma; Optic Neuritis Ear/Nose/Mouth/Throat Medical History: Negative for: Chronic sinus problems/congestion; Middle ear problems Hematologic/Lymphatic [name], [name] ([id number]) [id number]  Page 11 of 12 Medical History: Positive for: Anemia - iron Negative for: Hemophilia; Human Immunodeficiency Virus; Lymphedema; Sickle Cell Disease Respiratory Medical History: Positive for: Sleep Apnea - CPAP Negative for: Aspiration; Asthma; Chronic Obstructive Pulmonary Disease (COPD); Pneumothorax; Tuberculosis Cardiovascular Medical History: Positive for: Hypertension; Peripheral Venous Disease Negative for: Angina; Arrhythmia; Congestive Heart Failure; Coronary Artery Disease; Deep Vein Thrombosis; Hypotension; Myocardial Infarction; Peripheral Arterial Disease; Phlebitis; Vasculitis Gastrointestinal Medical History: Negative for: Cirrhosis ; Colitis; Crohns; Hepatitis A; Hepatitis B; Hepatitis C Endocrine Medical History: Negative for: Type I Diabetes; Type II Diabetes Genitourinary Medical History: Negative for: End Stage Renal Disease Past Medical History Notes: years ago kidney stones Immunological Medical History: Negative for: Lupus Erythematosus; Raynauds; Scleroderma Integumentary (Skin) Medical History: Negative for: History of Burn Musculoskeletal Medical History: Negative for: Gout; Rheumatoid Arthritis; Osteoarthritis; Osteomyelitis Neurologic Medical History: Negative for: Dementia; Neuropathy; Quadriplegia; Paraplegia; Seizure Disorder Oncologic Medical History: Negative for: Received Chemotherapy; Received Radiation Past Medical History Notes: skin Ca removed from back years ago Psychiatric Medical History: Negative for: Anorexia/bulimia; Confinement Anxiety Immunizations Pneumococcal Vaccine: Received Pneumococcal Vaccination: No Implantable Devices None Hospitalization / Surgery History Type of Hospitalization/Surgery cholecystectomy 1980s nephrolithasis 1980s IRELYN, RAFAEL ([id number]) [id number] Page 12 of 12 plate and rod in right elbow surgery  2010 Family and Social History Cancer: Yes - Mother; Diabetes: Yes - Father; Heart Disease: No; Hypertension: Yes - Mother; Kidney Disease: Yes - Mother; Lung Disease: Yes - Father; Seizures: No; Stroke: No; Thyroid Problems: Yes - Paternal Grandparents; Tuberculosis: No; Never smoker; Marital Status - Married; Alcohol Use: Never; Drug Use: No History; Caffeine Use: Daily - coffee; Financial Concerns: No; Food, Clothing or Shelter Needs: No; Support System Lacking: No; Transportation Concerns: No Psychologist, prison and probation services) Signed: 08/16/2022 4:20:07 PM By: [name] MD FACS Signed: 08/16/2022 4:28:56 PM By: [name] Deed RN, BSN Entered By: [name] on 08/16/2022 15:41:08 -------------------------------------------------------------------------------- SuperBill Details Patient Name: Date of Service: [address] [name] 08/16/2022 Medical Record Number: [id number] Patient Account Number: [id number] Date of Birth/Sex: Treating RN: [birth year] (62 y.o. 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 Modifier: Quantity: 1 Physician Procedures : CPT4 Code Description Modifier [id number] - WC PHYS LEVEL 3 - EST PT 25 ICD-10 Diagnosis Description L97.212 Non-pressure chronic ulcer of  right calf with fat layer exposed L97.222 Non-pressure chronic ulcer of left calf with fat layer exposed  E66.01 Morbid (severe) obesity due to excess calories R60.0 Localized edema Quantity: 1 : [id number] 97597 - WC PHYS DEBR WO ANESTH 20 SQ CM ICD-10 Diagnosis Description 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

## 2022-08-18 ENCOUNTER — Other Ambulatory Visit: Payer: Self-pay | Admitting: Family Medicine

## 2022-08-18 DIAGNOSIS — R600 Localized edema: Secondary | ICD-10-CM

## 2022-08-23 ENCOUNTER — Encounter (HOSPITAL_BASED_OUTPATIENT_CLINIC_OR_DEPARTMENT_OTHER): Payer: BC Managed Care – PPO | Admitting: General Surgery

## 2022-08-23 DIAGNOSIS — L97212 Non-pressure chronic ulcer of right calf with fat layer exposed: Secondary | ICD-10-CM | POA: Diagnosis not present

## 2022-08-29 ENCOUNTER — Other Ambulatory Visit: Payer: Self-pay | Admitting: Family Medicine

## 2022-08-29 DIAGNOSIS — M545 Low back pain, unspecified: Secondary | ICD-10-CM

## 2022-08-30 ENCOUNTER — Encounter (HOSPITAL_BASED_OUTPATIENT_CLINIC_OR_DEPARTMENT_OTHER): Payer: BC Managed Care – PPO | Admitting: General Surgery

## 2022-08-30 DIAGNOSIS — L97212 Non-pressure chronic ulcer of right calf with fat layer exposed: Secondary | ICD-10-CM | POA: Diagnosis not present

## 2022-09-06 ENCOUNTER — Encounter (HOSPITAL_BASED_OUTPATIENT_CLINIC_OR_DEPARTMENT_OTHER): Payer: BC Managed Care – PPO | Admitting: General Surgery

## 2022-09-06 DIAGNOSIS — L97212 Non-pressure chronic ulcer of right calf with fat layer exposed: Secondary | ICD-10-CM | POA: Diagnosis not present

## 2022-09-13 ENCOUNTER — Encounter (HOSPITAL_BASED_OUTPATIENT_CLINIC_OR_DEPARTMENT_OTHER): Payer: BC Managed Care – PPO | Attending: General Surgery | Admitting: Internal Medicine

## 2022-09-13 DIAGNOSIS — I10 Essential (primary) hypertension: Secondary | ICD-10-CM | POA: Diagnosis not present

## 2022-09-13 DIAGNOSIS — L97222 Non-pressure chronic ulcer of left calf with fat layer exposed: Secondary | ICD-10-CM | POA: Diagnosis present

## 2022-09-13 DIAGNOSIS — Z833 Family history of diabetes mellitus: Secondary | ICD-10-CM | POA: Insufficient documentation

## 2022-09-13 DIAGNOSIS — L97212 Non-pressure chronic ulcer of right calf with fat layer exposed: Secondary | ICD-10-CM | POA: Insufficient documentation

## 2022-09-18 ENCOUNTER — Ambulatory Visit
Admission: RE | Admit: 2022-09-18 | Discharge: 2022-09-18 | Disposition: A | Discharge status: Home / Self Care | Payer: BC Managed Care – PPO | Source: Ambulatory Visit | Attending: Family Medicine | Admitting: Family Medicine

## 2022-09-18 DIAGNOSIS — Z1231 Encounter for screening mammogram for malignant neoplasm of breast: Secondary | ICD-10-CM

## 2022-09-20 ENCOUNTER — Encounter (HOSPITAL_BASED_OUTPATIENT_CLINIC_OR_DEPARTMENT_OTHER): Payer: BC Managed Care – PPO | Admitting: General Surgery

## 2022-09-20 DIAGNOSIS — L97212 Non-pressure chronic ulcer of right calf with fat layer exposed: Secondary | ICD-10-CM | POA: Diagnosis not present

## 2022-09-22 NOTE — Progress Notes (Signed)
FONNIE, CASADO ([id number]) [id number] Page 1 of 10 Visit Report for 09/20/2022 Arrival Information Details Patient Name: Date of Service: [place], [name] H. 09/20/2022 10:30 A M Medical Record Number: [id number] Patient Account Number: [id number] Date of Birth/Sex: Treating RN: [birth year] (62 y.o. [name], [name] Primary Care Ragen Laver: Nira Conn Other Clinician: Referring Audreana Hancox: Treating Harneet Noblett/Extender: Andrey Campanile in Treatment: 34 Visit Information History Since Last Visit Added or deleted any medications: No Patient Arrived: Ambulatory Any new allergies or adverse reactions: No Arrival Time: 10:30 Had a fall or experienced change in No Accompanied By: self activities of daily living that may affect Transfer Assistance: None risk of falls: Patient Identification Verified: Yes Signs or symptoms of abuse/neglect since last visito No Secondary Verification Process Completed: Yes Hospitalized since last visit: No Patient Requires Transmission-Based Precautions: No Implantable device outside of the clinic excluding No Patient Has Alerts: No cellular tissue based products placed in the center since last visit: Has Dressing in Place as Prescribed: Yes Has Compression in Place as Prescribed: Yes Pain Present Now: No Electronic Signature(s) Signed: 09/20/2022 4:22:01 PM By: [name] RN, BSN Entered By: [name] on 09/20/2022 10:30:35 -------------------------------------------------------------------------------- Compression Therapy Details Patient Name: Date of Service: [name], [name] 09/20/2022 10:30 A M Medical Record Number: [id number] Patient Account Number: [id number] Date of Birth/Sex: Treating RN: Apr 17, 1960 (62 y.o. [name] Primary Care Adel Burch: Nira Conn Other Clinician: Referring Renita Brocks: Treating Arin Vanosdol/Extender: Andrey Campanile in  Treatment: 34 Compression Therapy Performed for Wound Assessment: Wound #2 Right,Posterior Lower Leg Performed By: Clinician [name], RN Compression Type: Double Layer Post Procedure Diagnosis Same as Pre-procedure Notes Stephannie Li Electronic Signature(s) Signed: 09/20/2022 4:22:01 PM By: [name] RN, BSN Entered By: [name] on 09/20/2022 10:54:23 [name] ([id number]) [id number] Page 2 of 10 -------------------------------------------------------------------------------- Compression Therapy Details Patient Name: Date of Service: [place], [name] 09/20/2022 10:30 A M Medical Record Number: [id number] Patient Account Number: [id number] Date of Birth/Sex: Treating RN: 12-24-1960 (62 y.o. [name] Primary Care Patric Buckhalter: Nira Conn Other Clinician: Referring Terrilyn Tyner: Treating Randalyn Ahmed/Extender: Andrey Campanile in Treatment: 34 Compression Therapy Performed for Wound Assessment: Wound #3 Left,Posterior Lower Leg Performed By: Clinician [name], RN Compression Type: Double Layer Post Procedure Diagnosis Same as Pre-procedure Notes Stephannie Li Electronic Signature(s) Signed: 09/20/2022 4:22:01 PM By: [name] RN, BSN Entered By: [name] on 09/20/2022 10:54:23 -------------------------------------------------------------------------------- Encounter Discharge Information Details Patient Name: Date of Service: [place], [name] 09/20/2022 10:30 A M Medical Record Number: [id number] Patient Account Number: [id number] Date of Birth/Sex: Treating RN: [birth year] (62 y.o. [name] Primary Care Nakhi Choi: Nira Conn Other Clinician: Referring Daylyn Azbill: Treating Roston Grunewald/Extender: Andrey Campanile in Treatment: 34 Encounter Discharge Information Items Post Procedure Vitals Discharge Condition: Stable Temperature (F):  98.5 Ambulatory Status: Ambulatory Pulse (bpm): 89 Discharge Destination: Home Respiratory Rate (breaths/min): 20 Transportation: Private Auto Blood Pressure (mmHg): 134/84 Accompanied By: self Schedule Follow-up Appointment: Yes Clinical Summary of Care: Patient Declined Electronic Signature(s) Signed: 09/20/2022 4:22:01 PM By: [name] RN, BSN Entered By: [name] on 09/20/2022 11:18:18 -------------------------------------------------------------------------------- Lower Extremity Assessment Details Patient Name: Date of Service: [place], [name] 09/20/2022 10:30 A M Medical Record Number: [id number] Patient Account Number: [id number] Date of Birth/Sex: Treating RN: 1960/05/01 (62 y.o. [name] Primary Care Maximiliano Cromartie: Nira Conn Other Clinician: Referring Rayya Yagi: Treating Alexiya Franqui/Extender: [place] Cage H (  [id number]) [id number] Page 3 of 10 Weeks in Treatment: 34 Edema Assessment Assessed: [Left: No] [Right: No] Edema: [Left: Yes] [Right: Yes] Calf Left: Right: Point of Measurement: From Medial Instep 39 cm 40 cm Ankle Left: Right: Point of Measurement: From Medial Instep 23 cm 23.5 cm Vascular Assessment Pulses: Dorsalis Pedis Palpable: [Left:Yes] [Right:Yes] Electronic Signature(s) Signed: 09/20/2022 4:22:01 PM By: [name] RN, BSN Entered By: [name] on 09/20/2022 10:37:19 -------------------------------------------------------------------------------- Multi Wound Chart Details Patient Name: Date of Service: [name], [name] 09/20/2022 10:30 A M Medical Record Number: [id number] Patient Account Number: [id number] Date of Birth/Sex: Treating RN: [birth year] (62 y.o. F) Primary Care Caylea Foronda: Nira Conn Other Clinician: Referring Bodi Palmeri: Treating Phillp Dolores/Extender: Andrey Campanile in Treatment: 34 Vital  Signs Height(in): 61 Pulse(bpm): 89 Weight(lbs): 277 Blood Pressure(mmHg): 134/84 Body Mass Index(BMI): 52.3 Temperature(F): 98.5 Respiratory Rate(breaths/min): 20 [2:Photos:] [N/A:N/A] Right, Posterior Lower Leg Left, Posterior Lower Leg N/A Wound Location: Insect Bite Trauma N/A Wounding Event: Venous Leg Ulcer Venous Leg Ulcer N/A Primary Etiology: Anemia, Sleep Apnea, Hypertension, Anemia, Sleep Apnea, Hypertension, N/A Comorbid History: Peripheral Venous Disease Peripheral Venous Disease 11/21/2021 06/28/2022 N/A Date Acquired: 34 7 N/A Weeks of Treatment: Open Open N/A Wound Status: No No N/A Wound Recurrence: 1.8x1.4x0.1 1.9x1.4x0.1 N/A Measurements L x W x D (cm) 1.979 2.089 N/A A (cm) : rea 0.198 0.209 N/A Volume (cm) : -499.70% -57.40% N/A % Reduction in AreaTENISA, FLEETWOOD ([id number]) [id number].pdf Page 4 of 10 -100.00% -57.10% N/A % Reduction in Volume: Full Thickness Without Exposed Full Thickness Without Exposed N/A Classification: Support Structures Support Structures Medium Medium N/A Exudate A mount: Serosanguineous Serosanguineous N/A Exudate Type: red, brown red, brown N/A Exudate Color: Flat and Intact Flat and Intact N/A Wound Margin: Large (67-100%) Medium (34-66%) N/A Granulation A mount: Red, Hyper-granulation Red N/A Granulation Quality: None Present (0%) Medium (34-66%) N/A Necrotic A mount: Fat Layer (Subcutaneous Tissue): Yes Fat Layer (Subcutaneous Tissue): Yes N/A Exposed Structures: Fascia: No Fascia: No Tendon: No Tendon: No Muscle: No Muscle: No Joint: No Joint: No Bone: No Bone: No Medium (34-66%) Small (1-33%) N/A Epithelialization: Debridement - Selective/Open Wound Debridement - Selective/Open Wound N/A Debridement: Pre-procedure Verification/Time Out 10:50 10:50 N/A Taken: Lidocaine 4% Topical Solution Lidocaine 4% Topical Solution N/A Pain Control: Necrotic/Eschar, Proofreader, Slough N/A Tissue Debrided: Non-Viable Tissue Non-Viable Tissue N/A Level: 1.98 2.09 N/A Debridement A (sq cm): rea Curette Curette N/A Instrument: Minimum Minimum N/A Bleeding: Pressure Pressure N/A Hemostasis A chieved: 3 3 N/A Procedural Pain: 2 2 N/A Post Procedural Pain: Procedure was tolerated well Procedure was tolerated well N/A Debridement Treatment Response: 1.8x1.4x0.1 1.9x1.4x0.1 N/A Post Debridement Measurements L x W x D (cm) 0.198 0.209 N/A Post Debridement Volume: (cm) Excoriation: No Rash: Yes N/A Periwound Skin Texture: Scarring: No No Abnormalities Noted No Abnormalities Noted N/A Periwound Skin Moisture: Hemosiderin Staining: Yes Erythema: No N/A Periwound Skin Color: Atrophie Blanche: No Ecchymosis: No Erythema: No Rubor: No No Abnormality No Abnormality N/A Temperature: Yes Yes N/A Tenderness on Palpation: Compression Therapy Compression Therapy N/A Procedures Performed: Debridement Debridement Treatment Notes Electronic Signature(s) Signed: 09/20/2022 10:58:00 AM By: [name] MD FACS Entered By: [name] on 09/20/2022 10:58:00 -------------------------------------------------------------------------------- Multi-Disciplinary Care Plan Details Patient Name: Date of Service: [place], [name] 09/20/2022 10:30 A M Medical Record Number: [id number] Patient Account Number: [id number] Date of Birth/Sex: Treating RN: 09/09/1960 (62 y.o. [name] Primary Care Kaira Stringfield: Nira Conn Other Clinician: Referring Praise Dolecki: Treating Chrishawn Kring/Extender: Lady Gary  Hayes Ludwig Weeks in Treatment: 34 Multidisciplinary Care Plan reviewed with physician Active Inactive Necrotic Tissue Nursing Diagnoses: Impaired tissue integrity related to necrotic/devitalized tissue Knowledge deficit related to management of necrotic/devitalized tissue BERTICE, GIDEON ([id number])  [phone].pdf Page 5 of 10 Goals: Necrotic/devitalized tissue will be minimized in the wound bed Date Initiated: 01/23/2022 Target Resolution Date: 11/06/2022 Goal Status: Active Patient/caregiver will verbalize understanding of reason and process for debridement of necrotic tissue Date Initiated: 01/23/2022 Target Resolution Date: 11/06/2022 Goal Status: Active Interventions: Assess patient pain level pre-, during and post procedure and prior to discharge Provide education on necrotic tissue and debridement process Treatment Activities: Apply topical anesthetic as ordered : 01/23/2022 Notes: Venous Leg Ulcer Nursing Diagnoses: Knowledge deficit related to disease process and management Potential for venous Insuffiency (use before diagnosis confirmed) Goals: Patient will maintain optimal edema control Date Initiated: 07/05/2022 Target Resolution Date: 11/06/2022 Goal Status: Active Interventions: Assess peripheral edema status every visit. Compression as ordered Provide education on venous insufficiency Treatment Activities: Therapeutic compression applied : 07/05/2022 Notes: Wound/Skin Impairment Nursing Diagnoses: Impaired tissue integrity Knowledge deficit related to ulceration/compromised skin integrity Goals: Patient/caregiver will verbalize understanding of skin care regimen Date Initiated: 01/23/2022 Target Resolution Date: 11/06/2022 Goal Status: Active Interventions: Assess patient/caregiver ability to obtain necessary supplies Assess ulceration(s) every visit Treatment Activities: Skin care regimen initiated : 01/23/2022 Topical wound management initiated : 01/23/2022 Notes: Electronic Signature(s) Signed: 09/20/2022 4:22:01 PM By: [name] RN, BSN Entered By: [name] on 09/20/2022 08:42:30 -------------------------------------------------------------------------------- Pain Assessment Details Patient Name: Date of  Service: [name], [name] 09/20/2022 10:30 A M Medical Record Number: [id number] Patient Account Number: [id number] Date of Birth/Sex: Treating RN: July 09, 1960 (62 y.o. [address], [place], [place] ([id number]) [id number] Page 6 of 10 Primary Care Franky Reier: Nira Conn Other Clinician: Referring Ronelle Michie: Treating Erva Koke/Extender: Andrey Campanile in Treatment: 34 Active Problems Location of Pain Severity and Description of Pain Patient Has Paino No Site Locations Rate the pain. Current Pain Level: 0 Pain Management and Medication Current Pain Management: Electronic Signature(s) Signed: 09/20/2022 4:22:01 PM By: [name] RN, BSN Entered By: [name] on 09/20/2022 10:30:46 -------------------------------------------------------------------------------- Patient/Caregiver Education Details Patient Name: Date of Service: [name], [name] [name] 6/14/2024andnbsp10:30 A M Medical Record Number: [id number] Patient Account Number: [id number] Date of Birth/Gender: Treating RN: 05-29-1960 (62 y.o. [name] Primary Care Physician: Nira Conn Other Clinician: Referring Physician: Treating Physician/Extender: Andrey Campanile in Treatment: 64 Education Assessment Education Provided To: Patient Education Topics Provided Venous: Methods: Explain/Verbal Responses: Reinforcements needed, State content correctly Electronic Signature(s) Signed: 09/20/2022 4:22:01 PM By: [name] RN, BSN Entered By: [name] on 09/20/2022 08:42:50 [name] ([id number]) [id number] Page 7 of 10 -------------------------------------------------------------------------------- Wound Assessment Details Patient Name: Date of Service: [place], [name] 09/20/2022 10:30 A M Medical Record Number: [id number] Patient Account Number: [id number] Date of  Birth/Sex: Treating RN: March 16, 1961 (62 y.o. [name] Primary Care Sherlon Nied: Nira Conn Other Clinician: Referring Keyasha Miah: Treating Charlene Detter/Extender: Andrey Campanile in Treatment: 34 Wound Status Wound Number: 2 Primary Venous Leg Ulcer Etiology: Wound Location: Right, Posterior Lower Leg Wound Status: Open Wounding Event: Insect Bite Comorbid Anemia, Sleep Apnea, Hypertension, Peripheral Venous Date Acquired: 11/21/2021 History: Disease Weeks Of Treatment: 34 Clustered Wound: No Photos Wound Measurements Length: (cm) 1 Width: (cm) 1 Depth: (cm) 0 Area: (cm) Volume: (cm) .8 % Reduction in Area: -499.7% .4 % Reduction in Volume: -100% .1  Epithelialization: Medium (34-66%) 1.979 Tunneling: No 0.198 Undermining: No Wound Description Classification: Full Thickness Without Exposed Suppor Wound Margin: Flat and Intact Exudate Amount: Medium Exudate Type: Serosanguineous Exudate Color: red, brown t Structures Foul Odor After Cleansing: No Slough/Fibrino Yes Wound Bed Granulation Amount: Large (67-100%) Exposed Structure Granulation Quality: Red, Hyper-granulation Fascia Exposed: No Necrotic Amount: None Present (0%) Fat Layer (Subcutaneous Tissue) Exposed: Yes Tendon Exposed: No Muscle Exposed: No Joint Exposed: No Bone Exposed: No Periwound Skin Texture Texture Color No Abnormalities Noted: Yes No Abnormalities Noted: No Atrophie Blanche: No Moisture Ecchymosis: No No Abnormalities Noted: Yes Erythema: No Hemosiderin Staining: Yes Rubor: No Temperature / Pain Temperature: No Abnormality Tenderness on Palpation: Yes Treatment Notes ETHELENE, STEKETEE ([id number]) [id number] Page 8 of 10 Wound #2 (Lower Leg) Wound Laterality: Right, Posterior Cleanser Soap and Water Discharge Instruction: May shower and wash wound with dial antibacterial soap and water prior to dressing change. Wound  Cleanser Discharge Instruction: Cleanse the wound with wound cleanser prior to applying a clean dressing using gauze sponges, not tissue or cotton balls. Peri-Wound Care Sween Lotion (Moisturizing lotion) Discharge Instruction: Apply moisturizing lotion as directed Topical Primary Dressing Promogran Prisma Matrix, 4.34 (sq in) (silver collagen) Discharge Instruction: Moisten collagen with saline or hydrogel Secondary Dressing Woven Gauze Sponge, Non-Sterile 4x4 in Discharge Instruction: Apply over primary dressing as directed. Secured With Compression Wrap Urgo K2 Lite, (equivalent to a 3 layer) two layer compression system, regular Discharge Instruction: Apply Urgo K2 Lite as directed (alternative to 3 layer compression). Compression Stockings Add-Ons Electronic Signature(s) Signed: 09/20/2022 4:22:01 PM By: [name] RN, BSN Entered By: [name] on 09/20/2022 10:47:19 -------------------------------------------------------------------------------- Wound Assessment Details Patient Name: Date of Service: [place], [name] 09/20/2022 10:30 A M Medical Record Number: [id number] Patient Account Number: [id number] Date of Birth/Sex: Treating RN: Jul 12, 1960 (62 y.o. [name] Primary Care Guadalupe Kerekes: Nira Conn Other Clinician: Referring Chaslyn Eisen: Treating Kannon Granderson/Extender: Andrey Campanile in Treatment: 34 Wound Status Wound Number: 3 Primary Venous Leg Ulcer Etiology: Wound Location: Left, Posterior Lower Leg Wound Status: Open Wounding Event: Trauma Comorbid Anemia, Sleep Apnea, Hypertension, Peripheral Venous Date Acquired: 06/28/2022 History: Disease Weeks Of Treatment: 7 Clustered Wound: No Photos JESSAMINE, MCLIN ([id number]) [phone].pdf Page 9 of 10 Wound Measurements Length: (cm) 1.9 Width: (cm) 1.4 Depth: (cm) 0.1 Area: (cm) 2.089 Volume: (cm) 0.209 % Reduction in Area: -57.4% %  Reduction in Volume: -57.1% Epithelialization: Small (1-33%) Tunneling: No Undermining: No Wound Description Classification: Full Thickness Without Exposed Support Structures Wound Margin: Flat and Intact Exudate Amount: Medium Exudate Type: Serosanguineous Exudate Color: red, brown Foul Odor After Cleansing: No Slough/Fibrino Yes Wound Bed Granulation Amount: Medium (34-66%) Exposed Structure Granulation Quality: Red Fascia Exposed: No Necrotic Amount: Medium (34-66%) Fat Layer (Subcutaneous Tissue) Exposed: Yes Necrotic Quality: Adherent Slough Tendon Exposed: No Muscle Exposed: No Joint Exposed: No Bone Exposed: No Periwound Skin Texture Texture Color No Abnormalities Noted: Yes No Abnormalities Noted: Yes Moisture Temperature / Pain No Abnormalities Noted: Yes Temperature: No Abnormality Tenderness on Palpation: Yes Treatment Notes Wound #3 (Lower Leg) Wound Laterality: Left, Posterior Cleanser Soap and Water Discharge Instruction: May shower and wash wound with dial antibacterial soap and water prior to dressing change. Wound Cleanser Discharge Instruction: Cleanse the wound with wound cleanser prior to applying a clean dressing using gauze sponges, not tissue or cotton balls. Peri-Wound Care Triamcinolone 15 (g) Discharge Instruction: Use triamcinolone 15 (g) as directed Sween Lotion (Moisturizing lotion) Discharge Instruction: Apply moisturizing  lotion as directed Topical Primary Dressing Promogran Prisma Matrix, 4.34 (sq in) (silver collagen) Discharge Instruction: Moisten collagen with saline or hydrogel Secondary Dressing Woven Gauze Sponge, Non-Sterile 4x4 in Discharge Instruction: Apply over primary dressing as directed. Secured With Compression Wrap Urgo K2 Lite, (equivalent to a 3 layer) two layer compression system, regular Discharge Instruction: Apply Urgo K2 Lite as directed (alternative to 3 layer compression). Compression  Stockings Add-Ons Electronic Signature(s) Signed: 09/20/2022 4:22:01 PM By: [name] RN, BSN Entered By: [name] on 09/20/2022 10:47:43 [name] ([id number]) [id number] Page 10 of 10 -------------------------------------------------------------------------------- Vitals Details Patient Name: Date of Service: [place], [name] 09/20/2022 10:30 A M Medical Record Number: [id number] Patient Account Number: [id number] Date of Birth/Sex: Treating RN: January 13, 1961 (62 y.o. [name] Primary Care Aydyn Testerman: Nira Conn Other Clinician: Referring Welby Montminy: Treating Velma Agnes/Extender: Andrey Campanile in Treatment: 34 Vital Signs Time Taken: 10:33 Temperature (F): 98.5 Height (in): 61 Pulse (bpm): 89 Weight (lbs): 277 Respiratory Rate (breaths/min): 20 Body Mass Index (BMI): 52.3 Blood Pressure (mmHg): 134/84 Reference Range: 80 - 120 mg / dl Electronic Signature(s) Signed: 09/20/2022 4:22:01 PM By: [name] RN, BSN Entered By: [name] on 09/20/2022 10:33:29

## 2022-09-22 NOTE — Progress Notes (Signed)
[name], [name] ([id number]) [id number] Page 1 of 14 Visit Report for 09/20/2022 Chief Complaint Document Details Patient Name: Date of Service: [place], [name] 09/20/2022 10:30 A M Medical Record Number: [id number] Patient Account Number: [id number] Date of Birth/Sex: Treating [name]: [birth year] (62 y.o. F) Primary Care Provider: Nira Conn Other Clinician: Referring Provider: Treating Provider/Extender: Andrey Campanile in Treatment: 34 Information Obtained from: Patient Chief Complaint RLE ulcer Electronic Signature(s) Signed: 09/20/2022 10:58:07 AM By: [name] FACS Entered By: [name] Guess on 09/20/2022 10:58:07 -------------------------------------------------------------------------------- Debridement Details Patient Name: Date of Service: [name], [name] 09/20/2022 10:30 A M Medical Record Number: [id number] Patient Account Number: [id number] Date of Birth/Sex: Treating [name]: Apr 11, 1960 (62 y.o. [name] Primary Care Provider: Nira Conn Other Clinician: Referring Provider: Treating Provider/Extender: Andrey Campanile in Treatment: 34 Debridement Performed for Assessment: Wound #3 Left,Posterior Lower Leg Performed By: Physician [name] Guess, [name] Debridement Type: Debridement Severity of Tissue Pre Debridement: Fat layer exposed Level of Consciousness (Pre-procedure): Awake and Alert Pre-procedure Verification/Time Out Yes - 10:50 Taken: Start Time: 10:52 Pain Control: Lidocaine 4% Topical Solution Percent of Wound Bed Debrided: 100% T Area Debrided (cm): otal 2.09 Tissue and other material debrided: Non-Viable, Eschar, Slough, Slough Level: Non-Viable Tissue Debridement Description: Selective/Open Wound Instrument: Curette Bleeding: Minimum Hemostasis Achieved: Pressure Procedural Pain: 3 Post Procedural Pain: 2 Response to Treatment: Procedure  was tolerated well Level of Consciousness (Post- Awake and Alert procedure): Post Debridement Measurements of Total Wound Length: (cm) 1.9 Width: (cm) 1.4 Depth: (cm) 0.1 Volume: (cm) 0.209 Character of Wound/Ulcer Post Debridement: Improved Severity of Tissue Post Debridement: Fat layer exposed TENEA, STAY ([id number]) [phone] Page 2 of 14 Post Procedure Diagnosis Same as Pre-procedure Notes scribed for Dr. Lady [name] by [name] Deed, [name] Electronic Signature(s) Signed: 09/20/2022 12:49:31 PM By: [name] FACS Signed: 09/20/2022 4:22:01 PM By: [name], BSN Entered By: [name] Deed on 09/20/2022 10:57:13 -------------------------------------------------------------------------------- Debridement Details Patient Name: Date of Service: [name], [name] 09/20/2022 10:30 A M Medical Record Number: [id number] Patient Account Number: [id number] Date of Birth/Sex: Treating [name]: November 04, 1960 (62 y.o. [name] Primary Care Provider: Nira Conn Other Clinician: Referring Provider: Treating Provider/Extender: Andrey Campanile in Treatment: 34 Debridement Performed for Assessment: Wound #2 Right,Posterior Lower Leg Performed By: Physician [name] Guess, [name] Debridement Type: Debridement Severity of Tissue Pre Debridement: Fat layer exposed Level of Consciousness (Pre-procedure): Awake and Alert Pre-procedure Verification/Time Out Yes - 10:50 Taken: Start Time: 10:52 Pain Control: Lidocaine 4% Topical Solution Percent of Wound Bed Debrided: 100% T Area Debrided (cm): otal 1.98 Tissue and other material debrided: Non-Viable, Eschar, Slough, Slough Level: Non-Viable Tissue Debridement Description: Selective/Open Wound Instrument: Curette Bleeding: Minimum Hemostasis Achieved: Pressure Procedural Pain: 3 Post Procedural Pain: 2 Response to Treatment: Procedure was tolerated well Level  of Consciousness (Post- Awake and Alert procedure): Post Debridement Measurements of Total Wound Length: (cm) 1.8 Width: (cm) 1.4 Depth: (cm) 0.1 Volume: (cm) 0.198 Character of Wound/Ulcer Post Debridement: Improved Severity of Tissue Post Debridement: Fat layer exposed Post Procedure Diagnosis Same as Pre-procedure Notes scribed for Dr. Lady [name] by [name] Deed, [name] Electronic Signature(s) Signed: 09/20/2022 12:49:31 PM By: [name] FACS Signed: 09/20/2022 4:22:01 PM By: [name], BSN Entered By: [name] Deed on 09/20/2022 10:57:26 [name] ([id number]) [id number] Page 3 of 14 -------------------------------------------------------------------------------- HPI Details Patient Name: Date of Service: [name], [name] 09/20/2022 10:30 A M  Medical Record Number: [id number] Patient Account Number: [id number] Date of Birth/Sex: Treating [name]: Mar 03, 1961 (62 y.o. F) Primary Care Provider: Nira Conn Other Clinician: Referring Provider: Treating Provider/Extender: Andrey Campanile in Treatment: 34 History of Present Illness HPI Description: 02/16/2020 upon evaluation today patient actually appears to be doing poorly in regard to her left medial lower extremity ulcer. This is actually an area that she tells me she has had intermittent issues with over the years although has been closed for some time she typically uses compression right now she has juxta lite compression wraps. With that being said she tells me that this nonetheless open several weeks/months ago and has been given her trouble since. She does have a history of chronic venous insufficiency she is seeing specialist for this in the past she has had an ablation as well as sclerotherapy. With that being said she also has hypertension chronically which is managed by her primary care provider. In general she seems to be worsening overall with  regard to the wound and states that she finally realized that she needed to come in and have somebody look at this and not continue to try to manage this on her own. No fevers, chills, nausea, vomiting, or diarrhea. 02/23/2020 on evaluation today patient appears to be doing well with regard to her wound. This is showing some signs of improvement which is great news still were not quite at the point where I would like to be as far as the overall appearance of the wound is concerned but I do believe this is better than last week. I do believe the Iodoflex is helping as well. 03/08/2020 upon evaluation today patient appears to be doing well with regard to her wound. She has been tolerating the dressing changes without complication. Fortunately I feel like she has made great progress with the Iodoflex but I feel like it may be the point rest to switch to something else possibly a collagen- based dressing at this time. 03/15/2020 upon evaluation today patient appears to be doing excellent in regard to her leg ulcer. She has been tolerating the dressing changes without complication. Fortunately there is no signs of active infection. Overall she is measuring a little bit smaller today which is great news. 03/22/2020 upon evaluation today patient appears to be doing well with regard to her wound. She has been tolerating the dressing changes without complication. Fortunately there is no signs of active infection at this time. 03/28/2020; patient I do not usually see however she has a wound on the left anterior lower leg secondary to chronic venous insufficiency we have been using silver collagen under compression. She arrives in clinic with a nonviable surface requiring debridement 04/12/2020 upon evaluation today patient appears to be doing well all things considered with regard to her leg ulcer. She is tolerating the dressing changes without complication there is minimal dry skin around the edges of the wound  that may be trapping and stopping some of the events of the new skin I am can work on that today. Otherwise the surface of the wound appears to be doing excellent. 04/19/2020 upon evaluation today patient appears to be doing well with regard to her leg ulcer. She has been tolerating dressing changes without complication. Fortunately there is no signs of active infection at this time. No fever chills noted overall very pleased with how things seem to be progressing. 04/26/2020 on evaluation today patient appears to be doing well with regard to her wound currently.  Is showing signs of excellent improvement overall is filling in nicely and there does not appear to be any signs of infection. No fevers, chills, nausea, vomiting, or diarrhea. 05/03/2020 upon evaluation today patient appears to be doing well with regard to her wound on the leg. This overall showing signs of good improvement which is great she has some good epithelial growth and overall I think that things are moving in the correct direction. We likewise going to continue with the wound care measures as before since she seems to making such good improvement. 2/3; venous wound on the left medial leg. This is contracting. We are using Prisma and 3 layer compression. She has a stocking and waiting in the eventuality this heals. She is already using it on the right 05/17/2020 upon evaluation today patient appears to be doing well with regard to her leg ulcer. She has been tolerating the dressing changes without complication. Fortunately there is no signs of active infection which is great news and overall very pleased with where things stand today. No fevers, chills, nausea, vomiting, or diarrhea. 05/24/2020 upon evaluation today patient appears to be doing well with regard to her wound. Overall I feel like she is making excellent progress. There does not appear to be any signs of active infection which is great news. 05/31/2020 upon evaluation today  patient appears to be doing well with regard to her wound. There does not appear to be any signs of active infection which is great news overall I am extremely pleased with where things stand today. READMISSION 01/23/2022 She returns to clinic today with a new wound on her right posterior calf. She says that she was cleaning out an old shed near the middle of August this year and then noticed what seemed to be a bug bite on her right posterior calf. It was itchy, red, and raised. By the end of September, an ulcer had developed. She has been applying various topical creams such as hydrocortisone and others to the site. When it was not improving, she made an appointment in the wound care center. ABI in clinic today was 0.97. On her right posterior calf, there is a circular wound with necrotic fat and black eschar present. There is no purulent drainage or malodor. The periwound skin is in good condition with just a little induration that appears to be secondary to inflammation. 01/31/2022: The wound measures a little bit larger today, but overall is quite a bit cleaner. There is some undermining from 9:00 to 3:00. She is having some periwound itching and says that her wrap slid. 02/08/2022: Despite using an Unna boot first layer at the top of her wrap, it slid again and it looks like there is been some bruising at the wound site. The wound is about the same size in terms of dimensions. There is a fair amount of slough and nonviable tissue still present. Edema control is better than last week. 02/15/2022: The wound dimensions are about the same. There is less nonviable tissue present. The periwound erythema has improved. 02/22/2022: The wound has deteriorated over the past week. It is larger and the periwound is more edematous, erythematous, and indurated. It looks as though there has been more tissue breakdown with undermining present. She is having more pain. There is a foul odor coming from the  wound. 02/26/2022: The wound looks quite a bit better today and the odor is gone. I still do not have her culture data back, but she seems to be responding  well to the REXENE, VANESSEN ([id number]) [id number] Page 4 of 14 Augmentin. 03/06/2022: Her wound continues to improve. There is still some slough accumulation, but the periwound skin is less inflamed. Her culture returned with a polymicrobial population including Pseudomonas and so levofloxacin was also prescribed. She did not understand why a second antibiotic was being added so she has not yet initiated this. 03/14/2022: The wound is about the same, to perhaps a little bit larger. There is a fair amount of slough accumulation on the surface, as well as some hypertrophic granulation tissue. She is still taking levofloxacin, but has completed taking Augmentin. She has her Jodie Echevaria compound with her today. 12/14; the patient has a significant circular wound on the posterior right calf. She is using Keystone and silver alginate under 3 layer compression. Her ABIs were within normal limits at 0.97. This may have been traumatic or an insect bite at the start I reviewed these records. 04/04/2022: Since I last saw the wound, it has contracted considerably. There is a fairly thick layer of slough on the surface, as it has not been debrided since the last time I did it. Edema control is excellent and the periwound skin is in much better condition. 04/12/2022: No significant change in the wound dimensions. It is filling with granulation tissue. Still with slough accumulation on the surface. 04/19/2022: The wound is smaller this week. There is some slough accumulation on the surface. 04/26/2022: The wound is smaller again this week and significantly cleaner. The wound surface is a little bit drier than ideal. 05/03/2022: The wound measurements were about the same, but visually it appears smaller. There is still some undermining at  the top of the wound. Moisture balance is better this week. 05/10/2022: The wound measured smaller today. There is still a fair amount of undermining present. Slough has built up on the surface. We are still awaiting snap VAC approval. 05/17/2022: The wound is a little bit smaller today. There is less slough on the surface, but the granulation tissue still is not very robust. She has been approved for snap VAC but will have a 20% coinsurance and it is not clear what that amount would end up being for her. 05/27/2022: The surface of the wound has deteriorated and it is gray and fibrotic. No significant odor, but the drainage on her dressing was a little bit purulent. 05/31/2022: The wound looks quite a bit better today. It is still a little fibrotic but no longer has purulent-looking drainage. The color is better. She spoke with her insurance company and is interested in trying the snap VAC, now that she is aware of the cost to her. 06/07/2022: After 1 week in the snap VAC, there has been substantial improvement to her wound. The undermined portion is closing in. The surface has a healthier color and appearance. There is very minimal slough accumulation. 06/14/2022: The more shallow part of the undermined portion of her wound has closed. There is still some undermining from about 1-2 o'clock. The surface continues to improve. Minimal slough accumulation. 06/28/2022: The wound is shallower this week and the undermining has essentially closed and completely. The surface tissue is improving. 07/05/2022: The depth is almost immeasurable and the surface is nearly flush with the surrounding skin. The undermining has been eliminated. There is light slough on the wound surface. 07/12/2022: There is a little bit of slough on the wound surface. The depth is about the same as last week. 07/19/2022: The wound is essentially flush with  the surrounding skin surface. There is slight slough present. No real change in the AP or  transverse dimensions. 07/26/2022: The wound measured a little bit smaller today. It is flush with the surrounding skin surface and there is good granulation tissue present. Minimal slough and biofilm accumulation. 08/02/2022: The right posterior leg wound is a little bit smaller. There is a little slough accumulation. She reported a new wound to [place] today. It is on her left posterior calf. It has been present for about 6 weeks. She is not sure of how it occurred. She has been trying to manage it at home with topical Neosporin and Band-Aids. The fat layer is exposed. The surface is fibrotic and covered with slough. 08/09/2022: The right posterior leg wound is smaller again this week. There is good granulation tissue on the surface with minimal slough accumulation. The left posterior calf wound has built up a thick layer of slough. It is still quite fibrotic underneath the slough, but there is a little bit of a pink color beginning to emerge. Edema control is good. 08/16/2022: Both wounds are smaller this week. There is minimal slough on the right posterior leg wound. There is slough that has reaccumulated on the left posterior calf wound but there is a little bit more granulation tissue emerging. 08/23/2022: Both wounds are about the same size this week, but the quality of the tissue and amount of granulation tissue on the left are both better. 08/30/2022: Both wounds measure smaller today, but there has been some tissue breakdown adjacent to the left leg wound. She says it has been itchy. There is a little bit of slough on the surface of both wounds. 09/06/2022: The right wound measures smaller today. There is minimal slough on the wound surface. The left wound is about the same size. The periwound tissue looks better this week. There is still a layer of fibrinous slough on the left wound. Edema control is good bilaterally. 6/7 the patient has wounds on bilateral calfs. We are using silver alginate on the  right Iodoflex on the left under Urgo lite compression. The wounds are measuring smaller. 09/20/2022: The left wound is flush with the surrounding skin. There is good granulation tissue with some biofilm and thin eschar present. On the left, there is some slough accumulation and the patient says it has been a little itchy this week. Electronic Signature(s) Signed: 09/20/2022 10:58:58 AM By: [name] FACS Entered By: [name] Guess on 09/20/2022 10:58:58 [name] ([id number]) [id number] Page 5 of 14 -------------------------------------------------------------------------------- Physical Exam Details Patient Name: Date of Service: [name] 09/20/2022 10:30 A M Medical Record Number: [id number] Patient Account Number: [id number] Date of Birth/Sex: Treating [name]: [birth year] (62 y.o. F) Primary Care Provider: Nira Conn Other Clinician: Referring Provider: Treating Provider/Extender: Andrey Campanile in Treatment: 34 Constitutional . . . . no acute distress. Respiratory Normal work of breathing on room air. Notes 09/20/2022: The left wound is flush with the surrounding skin. There is good granulation tissue with some biofilm and thin eschar present. On the left, there is some slough accumulation and the patient says it has been a little itchy this week. Electronic Signature(s) Signed: 09/20/2022 11:22:14 AM By: [name] FACS Previous Signature: 09/20/2022 11:00:44 AM Version By: [name] FACS Entered By: [name] Guess on 09/20/2022 11:22:14 -------------------------------------------------------------------------------- Physician Orders Details Patient Name: Date of Service: [name], [name] 09/20/2022 10:30 A M Medical Record Number: [id number] Patient Account  Number: [id number] Date of Birth/Sex: Treating [name]: 1960/05/13 (62 y.o. 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 - Dr. Lady [name] RM 1 Return A Friday 6/21 @ 1:15 pm Anesthetic (In clinic) Topical Lidocaine 4% applied to wound bed Bathing/ Shower/ Hygiene May shower with protection but do not get wound dressing(s) wet. Protect dressing(s) with water repellant cover (for example, large plastic bag) or a cast cover and may then take shower. Edema Control - Lymphedema / SCD / Other Bilateral Lower Extremities Elevate legs to the level of the heart or above for 30 minutes daily and/or when sitting for 3-4 times a day throughout the day. Avoid standing for long periods of time. Exercise regularly YALINI, DUPLESSIS ([id number]) [phone].pdf Page 6 of 14 Moisturize legs daily. Wound Treatment Wound #2 - Lower Leg Wound Laterality: Right, Posterior Cleanser: Soap and Water 1 x Per Week/30 Days Discharge Instructions: May shower and wash wound with dial antibacterial soap and water prior to dressing change. Cleanser: Wound Cleanser 1 x Per Week/30 Days Discharge Instructions: Cleanse the wound with wound cleanser prior to applying a clean dressing using gauze sponges, not tissue or cotton balls. Peri-Wound Care: Sween Lotion (Moisturizing lotion) 1 x Per Week/30 Days Discharge Instructions: Apply moisturizing lotion as directed Prim Dressing: Promogran Prisma Matrix, 4.34 (sq in) (silver collagen) 1 x Per Week/30 Days ary Discharge Instructions: Moisten collagen with  saline or hydrogel Secondary Dressing: Woven Gauze Sponge, Non-Sterile 4x4 in 1 x Per Week/30 Days Discharge Instructions: Apply over primary dressing as directed. Compression Wrap: Urgo K2 Lite, (equivalent to a 3 layer) two layer compression system, regular 1 x Per Week/30 Days Discharge Instructions: Apply Urgo K2 Lite as directed (alternative to 3 layer compression). Wound #3 - Lower Leg Wound Laterality: Left, Posterior Cleanser: Soap and Water 1 x Per Week/30 Days Discharge Instructions: May shower and wash wound with dial antibacterial soap and water prior to dressing change. Cleanser: Wound Cleanser 1 x Per Week/30 Days Discharge Instructions: Cleanse the wound with wound cleanser prior to applying a clean dressing using gauze sponges, not tissue or cotton balls. Peri-Wound Care: Triamcinolone 15 (g) 1 x Per Week/30 Days Discharge Instructions: Use triamcinolone 15 (g) as directed Peri-Wound Care: Sween Lotion (Moisturizing lotion) 1 x Per Week/30 Days Discharge Instructions: Apply moisturizing lotion as directed Prim Dressing: Promogran Prisma Matrix, 4.34 (sq in) (silver collagen) 1 x Per Week/30 Days ary Discharge Instructions: Moisten collagen with saline or hydrogel Secondary Dressing: Woven Gauze Sponge, Non-Sterile 4x4 in 1 x Per Week/30 Days Discharge Instructions: Apply over primary dressing as directed. Compression Wrap: Urgo K2 Lite, (equivalent to a 3 layer) two layer compression system, regular 1 x Per Week/30 Days Discharge Instructions: Apply Urgo K2 Lite as directed (alternative to 3 layer compression). Electronic Signature(s) Signed: 09/20/2022 12:49:31 PM By: [name] FACS Entered By: [name] Guess on 09/20/2022 11:22:24 -------------------------------------------------------------------------------- Problem List Details Patient Name: Date of Service: [name], [name] 09/20/2022 10:30 A M Medical Record Number: [id number] Patient Account Number:  [id number] Date of Birth/Sex: Treating [name]: 12-04-1960 (62 y.o. F) [name] Deed Primary  Care Provider: Nira Conn Other Clinician: Referring Provider: Treating Provider/Extender: Andrey Campanile in Treatment: 34 Active Problems ICD-10 Encounter Code Description Active Date MDM Diagnosis L97.212 Non-pressure chronic ulcer of right calf with fat layer exposed 01/23/2022 No Yes DELANE, [place]LE ([id number]) [phone].pdf Page 7 of 14 [phone] Non-pressure chronic ulcer of left calf with fat layer exposed 08/02/2022 No Yes E66.01 Morbid (severe) obesity due to excess calories 01/23/2022 No Yes I10 Essential (primary) hypertension 01/23/2022 No Yes R60.0 Localized edema 01/23/2022 No Yes Inactive Problems Resolved Problems Electronic Signature(s) Signed: 09/20/2022 10:57:54 AM By: [name] FACS Entered By: [name] Guess on 09/20/2022 10:57:54 -------------------------------------------------------------------------------- Progress Note Details Patient Name: Date of Service: [name], [name] 09/20/2022 10:30 A M Medical Record Number: [id number] Patient Account Number: [id number] Date of Birth/Sex: Treating [name]: Nov 22, 1960 (62 y.o. F) Primary Care Provider: Nira Conn Other Clinician: Referring Provider: Treating Provider/Extender: Andrey Campanile in Treatment: 34 Subjective Chief Complaint Information obtained from Patient RLE ulcer History of Present Illness (HPI) 02/16/2020 upon evaluation today patient actually appears to be doing poorly in regard to her left medial lower extremity ulcer. This is actually an area that she tells me she has had intermittent issues with over the years although has been closed for some time she typically uses compression right now she has juxta lite compression wraps. With that being said she tells me that this nonetheless open several weeks/months  ago and has been given her trouble since. She does have a history of chronic venous insufficiency she is seeing specialist for this in the past she has had an ablation as well as sclerotherapy. With that being said she also has hypertension chronically which is managed by her primary care provider. In general she seems to be worsening overall with regard to the wound and states that she finally realized that she needed to come in and have somebody look at this and not continue to try to manage this on her own. No fevers, chills, nausea, vomiting, or diarrhea. 02/23/2020 on evaluation today patient appears to be doing well with regard to her wound. This is showing some signs of improvement which is great news still were not quite at the point where I would like to be as far as the overall appearance of the wound is concerned but I do believe this is better than last week. I do believe the Iodoflex is helping as well. 03/08/2020 upon evaluation today patient appears to be doing well with regard to her wound. She has been tolerating the dressing changes without complication. Fortunately I feel like she has made great progress with the Iodoflex but I feel like it may be the point rest to switch to something else possibly a collagen- based dressing at this time. 03/15/2020 upon evaluation today patient appears to be doing excellent in regard to her leg ulcer. She has been tolerating the dressing changes without complication. Fortunately there is no signs of active infection. Overall she is measuring a little bit smaller today which is great news. 03/22/2020 upon evaluation today patient appears to be doing well with regard to her wound. She has been tolerating the dressing changes without complication. Fortunately there is no signs of active infection at this time. 03/28/2020; patient I do not usually see however she has a wound on the left anterior lower leg secondary to chronic venous insufficiency we have  been using silver collagen under compression. She arrives in clinic with a nonviable surface  requiring debridement 04/12/2020 upon evaluation today patient appears to be doing well all things considered with regard to her leg ulcer. She is tolerating the dressing changes without complication there is minimal dry skin around the edges of the wound that may be trapping and stopping some of the events of the new skin I am can work on that today. Otherwise the surface of the wound appears to be doing excellent. 04/19/2020 upon evaluation today patient appears to be doing well with regard to her leg ulcer. She has been tolerating dressing changes without complication. Fortunately there is no signs of active infection at this time. No fever chills noted overall very pleased with how things seem to be progressing. [name], [name] ([id number]) [id number] Page 8 of 14 04/26/2020 on evaluation today patient appears to be doing well with regard to her wound currently. Is showing signs of excellent improvement overall is filling in nicely and there does not appear to be any signs of infection. No fevers, chills, nausea, vomiting, or diarrhea. 05/03/2020 upon evaluation today patient appears to be doing well with regard to her wound on the leg. This overall showing signs of good improvement which is great she has some good epithelial growth and overall I think that things are moving in the correct direction. We likewise going to continue with the wound care measures as before since she seems to making such good improvement. 2/3; venous wound on the left medial leg. This is contracting. We are using Prisma and 3 layer compression. She has a stocking and waiting in the eventuality this heals. She is already using it on the right 05/17/2020 upon evaluation today patient appears to be doing well with regard to her leg ulcer. She has been tolerating the dressing changes without  complication. Fortunately there is no signs of active infection which is great news and overall very pleased with where things stand today. No fevers, chills, nausea, vomiting, or diarrhea. 05/24/2020 upon evaluation today patient appears to be doing well with regard to her wound. Overall I feel like she is making excellent progress. There does not appear to be any signs of active infection which is great news. 05/31/2020 upon evaluation today patient appears to be doing well with regard to her wound. There does not appear to be any signs of active infection which is great news overall I am extremely pleased with where things stand today. READMISSION 01/23/2022 She returns to clinic today with a new wound on her right posterior calf. She says that she was cleaning out an old shed near the middle of August this year and then noticed what seemed to be a bug bite on her right posterior calf. It was itchy, red, and raised. By the end of September, an ulcer had developed. She has been applying various topical creams such as hydrocortisone and others to the site. When it was not improving, she made an appointment in the wound care center. ABI in clinic today was 0.97. On her right posterior calf, there is a circular wound with necrotic fat and black eschar present. There is no purulent drainage or malodor. The periwound skin is in good condition with just a little induration that appears to be secondary to inflammation. 01/31/2022: The wound measures a little bit larger today, but overall is quite a bit cleaner. There is some undermining from 9:00 to 3:00. She is having some periwound itching and says that her wrap slid. 02/08/2022: Despite using an Unna boot first layer at the top  of her wrap, it slid again and it looks like there is been some bruising at the wound site. The wound is about the same size in terms of dimensions. There is a fair amount of slough and nonviable tissue still present. Edema  control is better than last week. 02/15/2022: The wound dimensions are about the same. There is less nonviable tissue present. The periwound erythema has improved. 02/22/2022: The wound has deteriorated over the past week. It is larger and the periwound is more edematous, erythematous, and indurated. It looks as though there has been more tissue breakdown with undermining present. She is having more pain. There is a foul odor coming from the wound. 02/26/2022: The wound looks quite a bit better today and the odor is gone. I still do not have her culture data back, but she seems to be responding well to the Augmentin. 03/06/2022: Her wound continues to improve. There is still some slough accumulation, but the periwound skin is less inflamed. Her culture returned with a polymicrobial population including Pseudomonas and so levofloxacin was also prescribed. She did not understand why a second antibiotic was being added so she has not yet initiated this. 03/14/2022: The wound is about the same, to perhaps a little bit larger. There is a fair amount of slough accumulation on the surface, as well as some hypertrophic granulation tissue. She is still taking levofloxacin, but has completed taking Augmentin. She has her Jodie Echevaria compound with her today. 12/14; the patient has a significant circular wound on the posterior right calf. She is using Keystone and silver alginate under 3 layer compression. Her ABIs were within normal limits at 0.97. This may have been traumatic or an insect bite at the start I reviewed these records. 04/04/2022: Since I last saw the wound, it has contracted considerably. There is a fairly thick layer of slough on the surface, as it has not been debrided since the last time I did it. Edema control is excellent and the periwound skin is in much better condition. 04/12/2022: No significant change in the wound dimensions. It is filling with granulation tissue. Still with slough  accumulation on the surface. 04/19/2022: The wound is smaller this week. There is some slough accumulation on the surface. 04/26/2022: The wound is smaller again this week and significantly cleaner. The wound surface is a little bit drier than ideal. 05/03/2022: The wound measurements were about the same, but visually it appears smaller. There is still some undermining at the top of the wound. Moisture balance is better this week. 05/10/2022: The wound measured smaller today. There is still a fair amount of undermining present. Slough has built up on the surface. We are still awaiting snap VAC approval. 05/17/2022: The wound is a little bit smaller today. There is less slough on the surface, but the granulation tissue still is not very robust. She has been approved for snap VAC but will have a 20% coinsurance and it is not clear what that amount would end up being for her. 05/27/2022: The surface of the wound has deteriorated and it is gray and fibrotic. No significant odor, but the drainage on her dressing was a little bit purulent. 05/31/2022: The wound looks quite a bit better today. It is still a little fibrotic but no longer has purulent-looking drainage. The color is better. She spoke with her insurance company and is interested in trying the snap VAC, now that she is aware of the cost to her. 06/07/2022: After 1 week in the snap  VAC, there has been substantial improvement to her wound. The undermined portion is closing in. The surface has a healthier color and appearance. There is very minimal slough accumulation. 06/14/2022: The more shallow part of the undermined portion of her wound has closed. There is still some undermining from about 1-2 o'clock. The surface continues to improve. Minimal slough accumulation. 06/28/2022: The wound is shallower this week and the undermining has essentially closed and completely. The surface tissue is improving. 07/05/2022: The depth is almost immeasurable and the  surface is nearly flush with the surrounding skin. The undermining has been eliminated. There is light slough on the wound surface. 07/12/2022: There is a little bit of slough on the wound surface. The depth is about the same as last week. 07/19/2022: The wound is essentially flush with the surrounding skin surface. There is slight slough present. No real change in the AP or transverse dimensions. 07/26/2022: The wound measured a little bit smaller today. It is flush with the surrounding skin surface and there is good granulation tissue present. Minimal slough and biofilm accumulation. GIYANNA, BALLO ([id number]) [id number] Page 9 of 14 08/02/2022: The right posterior leg wound is a little bit smaller. There is a little slough accumulation. She reported a new wound to [place] today. It is on her left posterior calf. It has been present for about 6 weeks. She is not sure of how it occurred. She has been trying to manage it at home with topical Neosporin and Band-Aids. The fat layer is exposed. The surface is fibrotic and covered with slough. 08/09/2022: The right posterior leg wound is smaller again this week. There is good granulation tissue on the surface with minimal slough accumulation. The left posterior calf wound has built up a thick layer of slough. It is still quite fibrotic underneath the slough, but there is a little bit of a pink color beginning to emerge. Edema control is good. 08/16/2022: Both wounds are smaller this week. There is minimal slough on the right posterior leg wound. There is slough that has reaccumulated on the left posterior calf wound but there is a little bit more granulation tissue emerging. 08/23/2022: Both wounds are about the same size this week, but the quality of the tissue and amount of granulation tissue on the left are both better. 08/30/2022: Both wounds measure smaller today, but there has been some tissue breakdown adjacent to the left leg  wound. She says it has been itchy. There is a little bit of slough on the surface of both wounds. 09/06/2022: The right wound measures smaller today. There is minimal slough on the wound surface. The left wound is about the same size. The periwound tissue looks better this week. There is still a layer of fibrinous slough on the left wound. Edema control is good bilaterally. 6/7 the patient has wounds on bilateral calfs. We are using silver alginate on the right Iodoflex on the left under Urgo lite compression. The wounds are measuring smaller. 09/20/2022: The left wound is flush with the surrounding skin. There is good granulation tissue with some biofilm and thin eschar present. On the left, there is some slough accumulation and the patient says it has been a little itchy this week. Patient History Information obtained from Patient. Family History Cancer - Mother, Diabetes - Father, Hypertension - Mother, Kidney Disease - Mother, Lung Disease - Father, Thyroid Problems - Paternal Grandparents, No family history of Heart Disease, Seizures, Stroke, Tuberculosis. Social History Never smoker, Marital Status - Married,  Alcohol Use - Never, Drug Use - No History, Caffeine Use - Daily - coffee. Medical History Eyes Denies history of Cataracts, Glaucoma, Optic Neuritis Ear/Nose/Mouth/Throat Denies history of Chronic sinus problems/congestion, Middle ear problems Hematologic/Lymphatic Patient has history of Anemia - iron Denies history of Hemophilia, Human Immunodeficiency Virus, Lymphedema, Sickle Cell Disease Respiratory Patient has history of Sleep Apnea - CPAP Denies history of Aspiration, Asthma, Chronic Obstructive Pulmonary Disease (COPD), Pneumothorax, Tuberculosis Cardiovascular Patient has history of Hypertension, Peripheral Venous Disease Denies history of Angina, Arrhythmia, Congestive Heart Failure, Coronary Artery Disease, Deep Vein Thrombosis, Hypotension, Myocardial  Infarction, Peripheral Arterial Disease, Phlebitis, Vasculitis Gastrointestinal Denies history of Cirrhosis , Colitis, Crohns, Hepatitis A, Hepatitis B, Hepatitis C Endocrine Denies history of Type I Diabetes, Type II Diabetes Genitourinary Denies history of End Stage Renal Disease Immunological Denies history of Lupus Erythematosus, Raynauds, Scleroderma Integumentary (Skin) Denies history of History of Burn Musculoskeletal Denies history of Gout, Rheumatoid Arthritis, Osteoarthritis, Osteomyelitis Neurologic Denies history of Dementia, Neuropathy, Quadriplegia, Paraplegia, Seizure Disorder Oncologic Denies history of Received Chemotherapy, Received Radiation Psychiatric Denies history of Anorexia/bulimia, Confinement Anxiety Hospitalization/Surgery History - cholecystectomy 1980s. - nephrolithasis 1980s. - plate and rod in right elbow surgery 2010. Medical A Surgical History Notes nd Genitourinary years ago kidney stones Oncologic skin Ca removed from back years ago Objective KANWAL, TRUSCOTT ([id number]) [id number] Page 10 of 14 Constitutional no acute distress. Vitals Time Taken: 10:33 AM, Height: 61 in, Weight: 277 lbs, BMI: 52.3, Temperature: 98.5 F, Pulse: 89 bpm, Respiratory Rate: 20 breaths/min, Blood Pressure: 134/84 mmHg. Respiratory Normal work of breathing on room air. General Notes: 09/20/2022: The left wound is flush with the surrounding skin. There is good granulation tissue with some biofilm and thin eschar present. On the left, there is some slough accumulation and the patient says it has been a little itchy this week. Integumentary (Hair, Skin) Wound #2 status is Open. Original cause of wound was Insect Bite. The date acquired was: 11/21/2021. The wound has been in treatment 34 weeks. The wound is located on the Right,Posterior Lower Leg. The wound measures 1.8cm length x 1.4cm width x 0.1cm depth; 1.979cm^2 area and 0.198cm^3  volume. There is Fat Layer (Subcutaneous Tissue) exposed. There is no tunneling or undermining noted. There is a medium amount of serosanguineous drainage noted. The wound margin is flat and intact. There is large (67-100%) red, hyper - granulation within the wound bed. There is no necrotic tissue within the wound bed. The periwound skin appearance had no abnormalities noted for texture. The periwound skin appearance had no abnormalities noted for moisture. The periwound skin appearance exhibited: Hemosiderin Staining. The periwound skin appearance did not exhibit: Atrophie Blanche, Ecchymosis, Rubor, Erythema. Periwound temperature was noted as No Abnormality. The periwound has tenderness on palpation. Wound #3 status is Open. Original cause of wound was Trauma. The date acquired was: 06/28/2022. The wound has been in treatment 7 weeks. The wound is located on the Left,Posterior Lower Leg. The wound measures 1.9cm length x 1.4cm width x 0.1cm depth; 2.089cm^2 area and 0.209cm^3 volume. There is Fat Layer (Subcutaneous Tissue) exposed. There is no tunneling or undermining noted. There is a medium amount of serosanguineous drainage noted. The wound margin is flat and intact. There is medium (34-66%) red granulation within the wound bed. There is a medium (34-66%) amount of necrotic tissue within the wound bed including Adherent Slough. The periwound skin appearance had no abnormalities noted for texture. The periwound skin appearance had no abnormalities noted for moisture.  The periwound skin appearance had no abnormalities noted for color. Periwound temperature was noted as No Abnormality. The periwound has tenderness on palpation. 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 There was a Selective/Open Wound Non-Viable Tissue Debridement with a total area of 1.98 sq cm performed by [name] Guess, [name] With the following instrument(s): Curette to remove Non-Viable tissue/material. Material removed includes Eschar and Slough and after achieving pain control using Lidocaine 4% T opical Solution. No specimens were taken. A time out was conducted at 10:50, prior to the start of the procedure. A Minimum amount of bleeding was controlled with Pressure. The procedure was tolerated well with a pain level of 3 throughout and a pain level of 2 following the procedure. Post Debridement Measurements: 1.8cm length x 1.4cm width x 0.1cm depth; 0.198cm^3 volume. Character of Wound/Ulcer Post Debridement is improved. Severity of Tissue Post Debridement is: Fat layer exposed. Post procedure Diagnosis Wound #2: Same as Pre-Procedure General Notes: scribed for Dr. Lady [name] by [name] Deed, [name] Pre-procedure diagnosis of Wound #2 is a Venous Leg Ulcer located on the Right,Posterior Lower Leg . There was a Double Layer Compression Therapy Procedure by [name] Deed, [name] Post procedure Diagnosis Wound #2: Same as Pre-Procedure Notes: urgo lite. Wound #3 Pre-procedure diagnosis of Wound #3 is a Venous Leg Ulcer located on the Left,Posterior Lower Leg .Severity of Tissue Pre Debridement is: Fat layer exposed. There was a Selective/Open Wound Non-Viable Tissue Debridement with a total area of 2.09 sq cm performed by [name] Guess, [name] With the following instrument(s): Curette to remove Non-Viable tissue/material. Material removed includes Eschar and Slough and after achieving pain control using Lidocaine 4% T opical Solution. No specimens were taken. A time out was conducted at 10:50, prior to the start of  the procedure. A Minimum amount of bleeding was controlled with Pressure. The procedure was tolerated well with a pain level of 3 throughout and a pain level of 2 following the procedure. Post Debridement Measurements: 1.9cm length x 1.4cm width x 0.1cm depth; 0.209cm^3 volume. Character of Wound/Ulcer Post Debridement is improved. Severity of Tissue Post Debridement is: Fat layer exposed. Post procedure Diagnosis Wound #3: Same as Pre-Procedure General Notes: scribed for Dr. Lady [name] by [name] Deed, [name] Pre-procedure diagnosis of Wound #3 is a Venous Leg Ulcer located on the Left,Posterior Lower Leg . There was a Double Layer Compression Therapy Procedure by [name] Deed, [name] Post procedure Diagnosis Wound #3: Same as Pre-Procedure Notes: urgo lite. Plan ELLIORA, GOVIER ([id number]) [id number] Page 11 of 14 Follow-up Appointments: Return Appointment in 1 week. - Dr. Lady [name] RM 1 Friday 6/21 @ 1:15 pm Anesthetic: (In clinic) Topical Lidocaine 4% applied to wound bed Bathing/ Shower/ Hygiene: May shower with protection but do not get wound dressing(s) wet. Protect dressing(s) with water repellant cover (for example, large plastic bag) or a cast cover and may then take shower. Edema Control - Lymphedema / SCD / Other: Elevate legs to the level of the heart or above for 30 minutes daily and/or when sitting for 3-4 times a day throughout the day. Avoid standing for long periods of time. Exercise regularly Moisturize legs daily. WOUND #2: - Lower  Leg Wound Laterality: Right, Posterior Cleanser: Soap and Water 1 x Per Week/30 Days Discharge Instructions: May shower and wash wound with dial antibacterial soap and water prior to dressing change. Cleanser: Wound Cleanser 1 x Per Week/30 Days Discharge Instructions: Cleanse the wound with wound cleanser prior to applying a clean dressing using gauze sponges, not tissue or cotton balls. Peri-Wound Care: Sween Lotion  (Moisturizing lotion) 1 x Per Week/30 Days Discharge Instructions: Apply moisturizing lotion as directed Prim Dressing: Promogran Prisma Matrix, 4.34 (sq in) (silver collagen) 1 x Per Week/30 Days ary Discharge Instructions: Moisten collagen with saline or hydrogel Secondary Dressing: Woven Gauze Sponge, Non-Sterile 4x4 in 1 x Per Week/30 Days Discharge Instructions: Apply over primary dressing as directed. Com pression Wrap: Urgo K2 Lite, (equivalent to a 3 layer) two layer compression system, regular 1 x Per Week/30 Days Discharge Instructions: Apply Urgo K2 Lite as directed (alternative to 3 layer compression). WOUND #3: - Lower Leg Wound Laterality: Left, Posterior Cleanser: Soap and Water 1 x Per Week/30 Days Discharge Instructions: May shower and wash wound with dial antibacterial soap and water prior to dressing change. Cleanser: Wound Cleanser 1 x Per Week/30 Days Discharge Instructions: Cleanse the wound with wound cleanser prior to applying a clean dressing using gauze sponges, not tissue or cotton balls. Peri-Wound Care: Triamcinolone 15 (g) 1 x Per Week/30 Days Discharge Instructions: Use triamcinolone 15 (g) as directed Peri-Wound Care: Sween Lotion (Moisturizing lotion) 1 x Per Week/30 Days Discharge Instructions: Apply moisturizing lotion as directed Prim Dressing: Promogran Prisma Matrix, 4.34 (sq in) (silver collagen) 1 x Per Week/30 Days ary Discharge Instructions: Moisten collagen with saline or hydrogel Secondary Dressing: Woven Gauze Sponge, Non-Sterile 4x4 in 1 x Per Week/30 Days Discharge Instructions: Apply over primary dressing as directed. Com pression Wrap: Urgo K2 Lite, (equivalent to a 3 layer) two layer compression system, regular 1 x Per Week/30 Days Discharge Instructions: Apply Urgo K2 Lite as directed (alternative to 3 layer compression). 09/20/2022: The right wound is flush with the surrounding skin. There is good granulation tissue with some biofilm and  thin eschar present. On the left, there is some slough accumulation and the patient says it has been a little itchy this week. I used a curette to debride eschar and biofilm from the left wound and slough fro right wound and slough from the left wound. I am going to use Prisma silver collagen to both. Will use some periwound triamcinolone to help alleviate the itching on the left. Continue bilateral 3 layer compression/equivalent. Follow-up in 1 week. Electronic Signature(s) Signed: 09/20/2022 11:23:39 AM By: [name] FACS Entered By: [name] Guess on 09/20/2022 11:23:39 -------------------------------------------------------------------------------- HxROS Details Patient Name: Date of Service: [name], [name] 09/20/2022 10:30 A M Medical Record Number: [id number] Patient Account Number: [id number] Date of Birth/Sex: Treating [name]: 1961/02/21 (62 y.o. F) Primary Care Provider: Nira Conn Other Clinician: Referring Provider: Treating Provider/Extender: Andrey Campanile in Treatment: 34 Information Obtained From Patient Eyes Medical History: Negative for: Cataracts; Glaucoma; Optic Neuritis ASAKO, ALLEGRO ([id number]) [phone].pdf Page 12 of 14 Ear/Nose/Mouth/Throat Medical History: Negative for: Chronic sinus problems/congestion; Middle ear problems Hematologic/Lymphatic Medical History: Positive for: Anemia - iron Negative for: Hemophilia; Human Immunodeficiency Virus; Lymphedema; Sickle Cell Disease Respiratory Medical History: Positive for: Sleep Apnea - CPAP Negative for: Aspiration; Asthma; Chronic Obstructive Pulmonary Disease (COPD); Pneumothorax; Tuberculosis Cardiovascular Medical History: Positive for: Hypertension; Peripheral Venous Disease Negative for: Angina; Arrhythmia; Congestive Heart Failure; Coronary Artery Disease; Deep  Vein Thrombosis; Hypotension; Myocardial Infarction;  Peripheral Arterial Disease; Phlebitis; Vasculitis Gastrointestinal Medical History: Negative for: Cirrhosis ; Colitis; Crohns; Hepatitis A; Hepatitis B; Hepatitis C Endocrine Medical History: Negative for: Type I Diabetes; Type II Diabetes Genitourinary Medical History: Negative for: End Stage Renal Disease Past Medical History Notes: years ago kidney stones Immunological Medical History: Negative for: Lupus Erythematosus; Raynauds; Scleroderma Integumentary (Skin) Medical History: Negative for: History of Burn Musculoskeletal Medical History: Negative for: Gout; Rheumatoid Arthritis; Osteoarthritis; Osteomyelitis Neurologic Medical History: Negative for: Dementia; Neuropathy; Quadriplegia; Paraplegia; Seizure Disorder Oncologic Medical History: Negative for: Received Chemotherapy; Received Radiation Past Medical History Notes: skin Ca removed from back years ago Psychiatric Medical History: Negative for: Anorexia/bulimia; Confinement Anxiety Immunizations Pneumococcal Vaccine: Received Pneumococcal Vaccination: No Implantable Devices OTILIA, KARLEN ([id number]) [id number] Page 13 of 14 None Hospitalization / Surgery History Type of Hospitalization/Surgery cholecystectomy 1980s nephrolithasis 1980s plate and rod in right elbow surgery 2010 Family and Social History Cancer: Yes - Mother; Diabetes: Yes - Father; Heart Disease: No; Hypertension: Yes - Mother; Kidney Disease: Yes - Mother; Lung Disease: Yes - Father; Seizures: No; Stroke: No; Thyroid Problems: Yes - Paternal Grandparents; Tuberculosis: No; Never smoker; Marital Status - Married; Alcohol Use: Never; Drug Use: No History; Caffeine Use: Daily - coffee; Financial Concerns: No; Food, Clothing or Shelter Needs: No; Support System Lacking: No; Transportation Concerns: No Electronic Signature(s) Signed: 09/20/2022 12:49:31 PM By: [name] FACS Entered By: [name] Guess on 09/20/2022 11:00:36 -------------------------------------------------------------------------------- SuperBill Details Patient Name: Date of Service: [name], [name] 09/20/2022 Medical Record Number: [id number] Patient Account Number: [id number] Date of Birth/Sex: Treating [name]: 08-02-1960 (62 y.o. 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 Modifier: Quantity: 1 Physician Procedures : CPT4 Code Description Modifier [id number] - WC PHYS LEVEL 3 - EST PT 25 ICD-10 Diagnosis Description 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

## 2022-09-27 ENCOUNTER — Encounter (HOSPITAL_BASED_OUTPATIENT_CLINIC_OR_DEPARTMENT_OTHER): Payer: BC Managed Care – PPO | Admitting: General Surgery

## 2022-09-27 DIAGNOSIS — L97212 Non-pressure chronic ulcer of right calf with fat layer exposed: Secondary | ICD-10-CM | POA: Diagnosis not present

## 2022-09-27 NOTE — Progress Notes (Signed)
[name], [name] ([id number]) [id number] Page 1 of 14 Visit Report for 09/27/2022 Chief Complaint Document Details Patient Name: Date of Service: [name] 09/27/2022 1:15 PM Medical Record Number: [id number] Patient Account Number: [id number] Date of Birth/Sex: Treating RN: 01/01/1961 (62 y.o. F) Primary Care Provider: Nira Conn Other Clinician: Referring Provider: Treating Provider/Extender: Andrey Campanile in Treatment: 35 Information Obtained from: Patient Chief Complaint RLE ulcer Electronic Signature(s) Signed: 09/27/2022 1:48:03 PM By: [name] MD FACS Entered By: [name] on 09/27/2022 13:48:03 -------------------------------------------------------------------------------- Debridement Details Patient Name: Date of Service: [address] [name] 09/27/2022 1:15 PM Medical Record Number: [id number] Patient Account Number: [id number] Date of Birth/Sex: Treating RN: 14-Jan-1961 (62 y.o. [name] Primary Care Provider: Nira Conn Other Clinician: Referring Provider: Treating Provider/Extender: Andrey Campanile in Treatment: 35 Debridement Performed for Assessment: Wound #3 Left,Posterior Lower Leg Performed By: Physician [name], MD Debridement Type: Debridement Severity of Tissue Pre Debridement: Fat layer exposed Level of Consciousness (Pre-procedure): Awake and Alert Pre-procedure Verification/Time Out Yes - 13:50 Taken: Start Time: 13:53 Pain Control: Lidocaine 4% T opical Solution Percent of Wound Bed Debrided: 100% T Area Debrided (cm): otal 1.18 Tissue and other material debrided: Viable, Non-Viable, Slough, Subcutaneous, Slough Level: Skin/Subcutaneous Tissue Debridement Description: Excisional Instrument: Curette Bleeding: Minimum Hemostasis Achieved: Pressure Procedural Pain: 3 Post Procedural Pain: 2 Response to Treatment:  Procedure was tolerated well Level of Consciousness (Post- Awake and Alert procedure): Post Debridement Measurements of Total Wound Length: (cm) 1.5 Width: (cm) 1 Depth: (cm) 0.1 Volume: (cm) 0.118 Character of Wound/Ulcer Post Debridement: Improved Severity of Tissue Post Debridement: Fat layer exposed LATIYA, NAVIA ([id number]) [id number] Page 2 of 14 Post Procedure Diagnosis Same as Pre-procedure Notes scribed for Dr. Lady Gary by [name], RN Electronic Signature(s) Signed: 09/27/2022 5:01:45 PM By: [name] RN, BSN Signed: 09/30/2022 9:35:48 AM By: [name] MD FACS Entered By: [name] on 09/27/2022 13:56:37 -------------------------------------------------------------------------------- Debridement Details Patient Name: Date of Service: [place], [name] 09/27/2022 1:15 PM Medical Record Number: [id number] Patient Account Number: [id number] Date of Birth/Sex: Treating RN: 06/25/1960 (62 y.o. [name] Primary Care Provider: Nira Conn Other Clinician: Referring Provider: Treating Provider/Extender: Andrey Campanile in Treatment: 35 Debridement Performed for Assessment: Wound #2 Right,Posterior Lower Leg Performed By: Physician [name], MD Debridement Type: Debridement Severity of Tissue Pre Debridement: Fat layer exposed Level of Consciousness (Pre-procedure): Awake and Alert Pre-procedure Verification/Time Out Yes - 13:50 Taken: Start Time: 13:53 Pain Control: Lidocaine 4% T opical Solution Percent of Wound Bed Debrided: 100% T Area Debrided (cm): otal 2.98 Tissue and other material debrided: Non-Viable, Slough, Slough Level: Non-Viable Tissue Debridement Description: Selective/Open Wound Instrument: Curette Bleeding: Minimum Hemostasis Achieved: Pressure Procedural Pain: 3 Post Procedural Pain: 2 Response to Treatment: Procedure was tolerated well Level of  Consciousness (Post- Awake and Alert procedure): Post Debridement Measurements of Total Wound Length: (cm) 2 Width: (cm) 1.9 Depth: (cm) 0.1 Volume: (cm) 0.298 Character of Wound/Ulcer Post Debridement: Improved Severity of Tissue Post Debridement: Fat layer exposed Post Procedure Diagnosis Same as Pre-procedure Notes scribed for Dr. Lady Gary by [name], RN Electronic Signature(s) Signed: 09/27/2022 5:01:45 PM By: [name] RN, BSN Signed: 09/30/2022 9:35:48 AM By: [name] MD FACS Entered By: [name] on 09/27/2022 13:58:04 [name] ([id number]) [id number] Page 3 of 14 -------------------------------------------------------------------------------- HPI Details Patient Name: Date of Service: [name], [name] 09/27/2022 1:15 PM Medical Record Number:  [id number] Patient Account Number: [id number] Date of Birth/Sex: Treating RN: Aug 16, 1960 (62 y.o. F) Primary Care Provider: Nira Conn Other Clinician: Referring Provider: Treating Provider/Extender: Andrey Campanile in Treatment: 35 History of Present Illness HPI Description: 02/16/2020 upon evaluation today patient actually appears to be doing poorly in regard to her left medial lower extremity ulcer. This is actually an area that she tells me she has had intermittent issues with over the years although has been closed for some time she typically uses compression right now she has juxta lite compression wraps. With that being said she tells me that this nonetheless open several weeks/months ago and has been given her trouble since. She does have a history of chronic venous insufficiency she is seeing specialist for this in the past she has had an ablation as well as sclerotherapy. With that being said she also has hypertension chronically which is managed by her primary care provider. In general she seems to be worsening overall with regard to  the wound and states that she finally realized that she needed to come in and have somebody look at this and not continue to try to manage this on her own. No fevers, chills, nausea, vomiting, or diarrhea. 02/23/2020 on evaluation today patient appears to be doing well with regard to her wound. This is showing some signs of improvement which is great news still were not quite at the point where I would like to be as far as the overall appearance of the wound is concerned but I do believe this is better than last week. I do believe the Iodoflex is helping as well. 03/08/2020 upon evaluation today patient appears to be doing well with regard to her wound. She has been tolerating the dressing changes without complication. Fortunately I feel like she has made great progress with the Iodoflex but I feel like it may be the point rest to switch to something else possibly a collagen- based dressing at this time. 03/15/2020 upon evaluation today patient appears to be doing excellent in regard to her leg ulcer. She has been tolerating the dressing changes without complication. Fortunately there is no signs of active infection. Overall she is measuring a little bit smaller today which is great news. 03/22/2020 upon evaluation today patient appears to be doing well with regard to her wound. She has been tolerating the dressing changes without complication. Fortunately there is no signs of active infection at this time. 03/28/2020; patient I do not usually see however she has a wound on the left anterior lower leg secondary to chronic venous insufficiency we have been using silver collagen under compression. She arrives in clinic with a nonviable surface requiring debridement 04/12/2020 upon evaluation today patient appears to be doing well all things considered with regard to her leg ulcer. She is tolerating the dressing changes without complication there is minimal dry skin around the edges of the wound that may be  trapping and stopping some of the events of the new skin I am can work on that today. Otherwise the surface of the wound appears to be doing excellent. 04/19/2020 upon evaluation today patient appears to be doing well with regard to her leg ulcer. She has been tolerating dressing changes without complication. Fortunately there is no signs of active infection at this time. No fever chills noted overall very pleased with how things seem to be progressing. 04/26/2020 on evaluation today patient appears to be doing well with regard to her wound currently. Is showing signs  of excellent improvement overall is filling in nicely and there does not appear to be any signs of infection. No fevers, chills, nausea, vomiting, or diarrhea. 05/03/2020 upon evaluation today patient appears to be doing well with regard to her wound on the leg. This overall showing signs of good improvement which is great she has some good epithelial growth and overall I think that things are moving in the correct direction. We likewise going to continue with the wound care measures as before since she seems to making such good improvement. 2/3; venous wound on the left medial leg. This is contracting. We are using Prisma and 3 layer compression. She has a stocking and waiting in the eventuality this heals. She is already using it on the right 05/17/2020 upon evaluation today patient appears to be doing well with regard to her leg ulcer. She has been tolerating the dressing changes without complication. Fortunately there is no signs of active infection which is great news and overall very pleased with where things stand today. No fevers, chills, nausea, vomiting, or diarrhea. 05/24/2020 upon evaluation today patient appears to be doing well with regard to her wound. Overall I feel like she is making excellent progress. There does not appear to be any signs of active infection which is great news. 05/31/2020 upon evaluation today patient  appears to be doing well with regard to her wound. There does not appear to be any signs of active infection which is great news overall I am extremely pleased with where things stand today. READMISSION 01/23/2022 She returns to clinic today with a new wound on her right posterior calf. She says that she was cleaning out an old shed near the middle of August this year and then noticed what seemed to be a bug bite on her right posterior calf. It was itchy, red, and raised. By the end of September, an ulcer had developed. She has been applying various topical creams such as hydrocortisone and others to the site. When it was not improving, she made an appointment in the wound care center. ABI in clinic today was 0.97. On her right posterior calf, there is a circular wound with necrotic fat and black eschar present. There is no purulent drainage or malodor. The periwound skin is in good condition with just a little induration that appears to be secondary to inflammation. 01/31/2022: The wound measures a little bit larger today, but overall is quite a bit cleaner. There is some undermining from 9:00 to 3:00. She is having some periwound itching and says that her wrap slid. 02/08/2022: Despite using an Unna boot first layer at the top of her wrap, it slid again and it looks like there is been some bruising at the wound site. The wound is about the same size in terms of dimensions. There is a fair amount of slough and nonviable tissue still present. Edema control is better than last week. 02/15/2022: The wound dimensions are about the same. There is less nonviable tissue present. The periwound erythema has improved. 02/22/2022: The wound has deteriorated over the past week. It is larger and the periwound is more edematous, erythematous, and indurated. It looks as though there has been more tissue breakdown with undermining present. She is having more pain. There is a foul odor coming from the  wound. 02/26/2022: The wound looks quite a bit better today and the odor is gone. I still do not have her culture data back, but she seems to be responding well to the  [name], [name] ([id number]) [id number] Page 4 of 14 Augmentin. 03/06/2022: Her wound continues to improve. There is still some slough accumulation, but the periwound skin is less inflamed. Her culture returned with a polymicrobial population including Pseudomonas and so levofloxacin was also prescribed. She did not understand why a second antibiotic was being added so she has not yet initiated this. 03/14/2022: The wound is about the same, to perhaps a little bit larger. There is a fair amount of slough accumulation on the surface, as well as some hypertrophic granulation tissue. She is still taking levofloxacin, but has completed taking Augmentin. She has her Jodie Echevaria compound with her today. 12/14; the patient has a significant circular wound on the posterior right calf. She is using Keystone and silver alginate under 3 layer compression. Her ABIs were within normal limits at 0.97. This may have been traumatic or an insect bite at the start I reviewed these records. 04/04/2022: Since I last saw the wound, it has contracted considerably. There is a fairly thick layer of slough on the surface, as it has not been debrided since the last time I did it. Edema control is excellent and the periwound skin is in much better condition. 04/12/2022: No significant change in the wound dimensions. It is filling with granulation tissue. Still with slough accumulation on the surface. 04/19/2022: The wound is smaller this week. There is some slough accumulation on the surface. 04/26/2022: The wound is smaller again this week and significantly cleaner. The wound surface is a little bit drier than ideal. 05/03/2022: The wound measurements were about the same, but visually it appears smaller. There is still some undermining at  the top of the wound. Moisture balance is better this week. 05/10/2022: The wound measured smaller today. There is still a fair amount of undermining present. Slough has built up on the surface. We are still awaiting snap VAC approval. 05/17/2022: The wound is a little bit smaller today. There is less slough on the surface, but the granulation tissue still is not very robust. She has been approved for snap VAC but will have a 20% coinsurance and it is not clear what that amount would end up being for her. 05/27/2022: The surface of the wound has deteriorated and it is gray and fibrotic. No significant odor, but the drainage on her dressing was a little bit purulent. 05/31/2022: The wound looks quite a bit better today. It is still a little fibrotic but no longer has purulent-looking drainage. The color is better. She spoke with her insurance company and is interested in trying the snap VAC, now that she is aware of the cost to her. 06/07/2022: After 1 week in the snap VAC, there has been substantial improvement to her wound. The undermined portion is closing in. The surface has a healthier color and appearance. There is very minimal slough accumulation. 06/14/2022: The more shallow part of the undermined portion of her wound has closed. There is still some undermining from about 1-2 o'clock. The surface continues to improve. Minimal slough accumulation. 06/28/2022: The wound is shallower this week and the undermining has essentially closed and completely. The surface tissue is improving. 07/05/2022: The depth is almost immeasurable and the surface is nearly flush with the surrounding skin. The undermining has been eliminated. There is light slough on the wound surface. 07/12/2022: There is a little bit of slough on the wound surface. The depth is about the same as last week. 07/19/2022: The wound is essentially flush with the surrounding skin  surface. There is slight slough present. No real change in the AP or  transverse dimensions. 07/26/2022: The wound measured a little bit smaller today. It is flush with the surrounding skin surface and there is good granulation tissue present. Minimal slough and biofilm accumulation. 08/02/2022: The right posterior leg wound is a little bit smaller. There is a little slough accumulation. She reported a new wound to [place] today. It is on her left posterior calf. It has been present for about 6 weeks. She is not sure of how it occurred. She has been trying to manage it at home with topical Neosporin and Band-Aids. The fat layer is exposed. The surface is fibrotic and covered with slough. 08/09/2022: The right posterior leg wound is smaller again this week. There is good granulation tissue on the surface with minimal slough accumulation. The left posterior calf wound has built up a thick layer of slough. It is still quite fibrotic underneath the slough, but there is a little bit of a pink color beginning to emerge. Edema control is good. 08/16/2022: Both wounds are smaller this week. There is minimal slough on the right posterior leg wound. There is slough that has reaccumulated on the left posterior calf wound but there is a little bit more granulation tissue emerging. 08/23/2022: Both wounds are about the same size this week, but the quality of the tissue and amount of granulation tissue on the left are both better. 08/30/2022: Both wounds measure smaller today, but there has been some tissue breakdown adjacent to the left leg wound. She says it has been itchy. There is a little bit of slough on the surface of both wounds. 09/06/2022: The right wound measures smaller today. There is minimal slough on the wound surface. The left wound is about the same size. The periwound tissue looks better this week. There is still a layer of fibrinous slough on the left wound. Edema control is good bilaterally. 6/7 the patient has wounds on bilateral calfs. We are using silver alginate on the  right Iodoflex on the left under Urgo lite compression. The wounds are measuring smaller. 09/20/2022: The right wound is flush with the surrounding skin. There is good granulation tissue with some biofilm and thin eschar present. On the left, there is some slough accumulation and the patient says it has been a little itchy this week. 09/27/2022: No significant change to the right wound. The left wound measures a little smaller, but still has some depth to it with slough accumulation. Electronic Signature(s) Signed: 09/27/2022 2:01:28 PM By: [name] MD FACS Entered By: [name] on 09/27/2022 14:01:27 [name] ([id number]) [id number] Page 5 of 14 -------------------------------------------------------------------------------- Physical Exam Details Patient Name: Date of Service: [name] 09/27/2022 1:15 PM Medical Record Number: [id number] Patient Account Number: [id number] Date of Birth/Sex: Treating RN: 12-Feb-1961 (62 y.o. F) Primary Care Provider: Nira Conn Other Clinician: Referring Provider: Treating Provider/Extender: Andrey Campanile in Treatment: 35 Constitutional . . . . no acute distress. Respiratory Normal work of breathing on room air. Notes 09/27/2022: No significant change to the right wound. The left wound measures a little smaller, but still has some depth to it with slough accumulation. Electronic Signature(s) Signed: 09/27/2022 2:03:03 PM By: [name] MD FACS Entered By: [name] on 09/27/2022 14:03:02 -------------------------------------------------------------------------------- Physician Orders Details Patient Name: Date of Service: [address], [name] 09/27/2022 1:15 PM Medical Record Number: [id number] Patient Account Number: [id number] Date of Birth/Sex: Treating RN: 1960-08-21 (  62 y.o. 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 - Dr. Lady Gary RM 3 Return A Friday 6/28 @ 11:00 am Anesthetic (In clinic) Topical Lidocaine 4% applied to wound bed Bathing/ Shower/ Hygiene May shower with protection but do not get wound dressing(s) wet. Protect dressing(s) with water repellant cover (for example, large plastic bag) or a cast cover and may then take shower. Edema Control - Lymphedema / SCD / Other Bilateral Lower Extremities Elevate legs to the level of the heart or above for 30 minutes daily and/or when sitting for 3-4 times a day throughout the day. Avoid standing for long periods of time. Exercise regularly Moisturize legs daily. [name], [name] ([id number]) [id number] Page 6 of 14 Wound Treatment Wound #2 - Lower Leg Wound Laterality: Right, Posterior Cleanser: Soap and Water 1 x Per Week/30 Days Discharge Instructions: May shower and wash wound with dial antibacterial soap and water prior to dressing change. Cleanser: Wound Cleanser 1 x Per Week/30 Days Discharge Instructions: Cleanse the wound with wound cleanser prior to applying a clean dressing using gauze sponges, not tissue or cotton balls. Peri-Wound Care: Sween Lotion (Moisturizing lotion) 1 x Per Week/30 Days Discharge Instructions: Apply moisturizing lotion as directed Topical: Gentamicin 1 x Per Week/30 Days Discharge Instructions: As directed by physician Topical: Mupirocin Ointment 1 x Per Week/30 Days Discharge Instructions:  Apply Mupirocin (Bactroban) as instructed Prim Dressing: Promogran Prisma Matrix, 4.34 (sq in) (silver collagen) 1 x Per Week/30 Days ary Discharge Instructions: Moisten collagen with saline or hydrogel Secondary Dressing: Woven Gauze Sponge, Non-Sterile 4x4 in 1 x Per Week/30 Days Discharge Instructions: Apply over primary dressing as directed. Compression Wrap: Urgo K2 Lite, (equivalent to a 3 layer) two layer compression system, regular 1 x Per Week/30 Days Discharge Instructions: Apply Urgo K2 Lite as directed (alternative to 3 layer compression). Wound #3 - Lower Leg Wound Laterality: Left, Posterior Cleanser: Soap and Water 1 x Per Week/30 Days Discharge Instructions: May shower and wash wound with dial antibacterial soap and water prior to dressing change. Cleanser: Wound Cleanser 1 x Per Week/30 Days Discharge Instructions: Cleanse the wound with wound cleanser prior to applying a clean dressing using gauze sponges, not tissue or cotton balls. Peri-Wound Care: Sween Lotion (Moisturizing lotion) 1 x Per Week/30 Days Discharge Instructions: Apply moisturizing lotion as directed Topical: Gentamicin 1 x Per Week/30 Days Discharge Instructions: As directed by physician Topical: Mupirocin Ointment 1 x Per Week/30 Days Discharge Instructions: Apply Mupirocin (Bactroban) as instructed Prim Dressing: Promogran Prisma Matrix, 4.34 (sq in) (silver collagen) 1 x Per Week/30 Days ary Discharge Instructions: Moisten collagen with saline or hydrogel Secondary Dressing: Woven Gauze Sponge, Non-Sterile 4x4 in 1 x Per Week/30 Days Discharge Instructions: Apply over primary dressing as directed. Compression Wrap: Urgo K2 Lite, (equivalent to a 3 layer) two layer compression system, regular 1 x Per Week/30 Days Discharge Instructions: Apply Urgo K2 Lite as directed (alternative to 3 layer compression). Electronic Signature(s) Signed: 09/30/2022 9:35:48 AM By: [name] MD FACS Entered By:  [name] on 09/27/2022 14:03:19 -------------------------------------------------------------------------------- Problem List Details Patient Name: Date of  Service: [place], [name] 09/27/2022 1:15 PM Medical Record Number: [id number] Patient Account Number: [id number] Date of Birth/Sex: Treating RN: 11-Jul-1960 (62 y.o. [name] Primary Care Provider: Nira Conn Other Clinician: Referring Provider: Treating Provider/Extender: Andrey Campanile in Treatment: [address] ALYANA, KREITER ([id number]) [id number] Page 7 of 14 ICD-10 Encounter Code Description Active Date MDM Diagnosis L97.212 Non-pressure chronic ulcer of right calf with fat layer exposed 01/23/2022 No Yes L97.222 Non-pressure chronic ulcer of left calf with fat layer exposed 08/02/2022 No Yes E66.01 Morbid (severe) obesity due to excess calories 01/23/2022 No Yes I10 Essential (primary) hypertension 01/23/2022 No Yes R60.0 Localized edema 01/23/2022 No Yes Inactive Problems Resolved Problems Electronic Signature(s) Signed: 09/27/2022 1:47:41 PM By: [name] MD FACS Entered By: [name] on 09/27/2022 13:47:40 -------------------------------------------------------------------------------- Progress Note Details Patient Name: Date of Service: [name], [name] 09/27/2022 1:15 PM Medical Record Number: [id number] Patient Account Number: [id number] Date of Birth/Sex: Treating RN: 1960/06/19 (62 y.o. F) Primary Care Provider: Nira Conn Other Clinician: Referring Provider: Treating Provider/Extender: Andrey Campanile in Treatment: 35 Subjective Chief Complaint Information obtained from Patient RLE ulcer History of Present Illness (HPI) 02/16/2020 upon evaluation today patient actually appears to be doing poorly in regard to her left medial lower extremity ulcer. This is actually an area  that she tells me she has had intermittent issues with over the years although has been closed for some time she typically uses compression right now she has juxta lite compression wraps. With that being said she tells me that this nonetheless open several weeks/months ago and has been given her trouble since. She does have a history of chronic venous insufficiency she is seeing specialist for this in the past she has had an ablation as well as sclerotherapy. With that being said she also has hypertension chronically which is managed by her primary care provider. In general she seems to be worsening overall with regard to the wound and states that she finally realized that she needed to come in and have somebody look at this and not continue to try to manage this on her own. No fevers, chills, nausea, vomiting, or diarrhea. 02/23/2020 on evaluation today patient appears to be doing well with regard to her wound. This is showing some signs of improvement which is great news still were not quite at the point where I would like to be as far as the overall appearance of the wound is concerned but I do believe this is better than last week. I do believe the Iodoflex is helping as well. 03/08/2020 upon evaluation today patient appears to be doing well with regard to her wound. She has been tolerating the dressing changes without complication. Fortunately I feel like she has made great progress with the Iodoflex but I feel like it may be the point rest to switch to something else possibly a collagen- based dressing at this time. 03/15/2020 upon evaluation today patient appears to be doing excellent in regard to her leg ulcer. She has been tolerating the dressing changes without complication. Fortunately there is no signs of active infection. Overall she is measuring a little bit smaller today which is great news. 03/22/2020 upon evaluation today patient appears to be doing well with regard to her wound. She  has been tolerating the dressing changes without complication. Fortunately there is no signs of active infection at this time. 03/28/2020; patient I do not usually see however she has a  wound on the left anterior lower leg secondary to chronic venous insufficiency we have been using CHRISTALYNN, [name] ([id number]) [phone].pdf Page 8 of 14 silver collagen under compression. She arrives in clinic with a nonviable surface requiring debridement 04/12/2020 upon evaluation today patient appears to be doing well all things considered with regard to her leg ulcer. She is tolerating the dressing changes without complication there is minimal dry skin around the edges of the wound that may be trapping and stopping some of the events of the new skin I am can work on that today. Otherwise the surface of the wound appears to be doing excellent. 04/19/2020 upon evaluation today patient appears to be doing well with regard to her leg ulcer. She has been tolerating dressing changes without complication. Fortunately there is no signs of active infection at this time. No fever chills noted overall very pleased with how things seem to be progressing. 04/26/2020 on evaluation today patient appears to be doing well with regard to her wound currently. Is showing signs of excellent improvement overall is filling in nicely and there does not appear to be any signs of infection. No fevers, chills, nausea, vomiting, or diarrhea. 05/03/2020 upon evaluation today patient appears to be doing well with regard to her wound on the leg. This overall showing signs of good improvement which is great she has some good epithelial growth and overall I think that things are moving in the correct direction. We likewise going to continue with the wound care measures as before since she seems to making such good improvement. 2/3; venous wound on the left medial leg. This is contracting. We are using Prisma and 3 layer  compression. She has a stocking and waiting in the eventuality this heals. She is already using it on the right 05/17/2020 upon evaluation today patient appears to be doing well with regard to her leg ulcer. She has been tolerating the dressing changes without complication. Fortunately there is no signs of active infection which is great news and overall very pleased with where things stand today. No fevers, chills, nausea, vomiting, or diarrhea. 05/24/2020 upon evaluation today patient appears to be doing well with regard to her wound. Overall I feel like she is making excellent progress. There does not appear to be any signs of active infection which is great news. 05/31/2020 upon evaluation today patient appears to be doing well with regard to her wound. There does not appear to be any signs of active infection which is great news overall I am extremely pleased with where things stand today. READMISSION 01/23/2022 She returns to clinic today with a new wound on her right posterior calf. She says that she was cleaning out an old shed near the middle of August this year and then noticed what seemed to be a bug bite on her right posterior calf. It was itchy, red, and raised. By the end of September, an ulcer had developed. She has been applying various topical creams such as hydrocortisone and others to the site. When it was not improving, she made an appointment in the wound care center. ABI in clinic today was 0.97. On her right posterior calf, there is a circular wound with necrotic fat and black eschar present. There is no purulent drainage or malodor. The periwound skin is in good condition with just a little induration that appears to be secondary to inflammation. 01/31/2022: The wound measures a little bit larger today, but overall is quite a bit cleaner. There is some  undermining from 9:00 to 3:00. She is having some periwound itching and says that her wrap slid. 02/08/2022: Despite using an  Unna boot first layer at the top of her wrap, it slid again and it looks like there is been some bruising at the wound site. The wound is about the same size in terms of dimensions. There is a fair amount of slough and nonviable tissue still present. Edema control is better than last week. 02/15/2022: The wound dimensions are about the same. There is less nonviable tissue present. The periwound erythema has improved. 02/22/2022: The wound has deteriorated over the past week. It is larger and the periwound is more edematous, erythematous, and indurated. It looks as though there has been more tissue breakdown with undermining present. She is having more pain. There is a foul odor coming from the wound. 02/26/2022: The wound looks quite a bit better today and the odor is gone. I still do not have her culture data back, but she seems to be responding well to the Augmentin. 03/06/2022: Her wound continues to improve. There is still some slough accumulation, but the periwound skin is less inflamed. Her culture returned with a polymicrobial population including Pseudomonas and so levofloxacin was also prescribed. She did not understand why a second antibiotic was being added so she has not yet initiated this. 03/14/2022: The wound is about the same, to perhaps a little bit larger. There is a fair amount of slough accumulation on the surface, as well as some hypertrophic granulation tissue. She is still taking levofloxacin, but has completed taking Augmentin. She has her Jodie Echevaria compound with her today. 12/14; the patient has a significant circular wound on the posterior right calf. She is using Keystone and silver alginate under 3 layer compression. Her ABIs were within normal limits at 0.97. This may have been traumatic or an insect bite at the start I reviewed these records. 04/04/2022: Since I last saw the wound, it has contracted considerably. There is a fairly thick layer of slough on the surface, as it  has not been debrided since the last time I did it. Edema control is excellent and the periwound skin is in much better condition. 04/12/2022: No significant change in the wound dimensions. It is filling with granulation tissue. Still with slough accumulation on the surface. 04/19/2022: The wound is smaller this week. There is some slough accumulation on the surface. 04/26/2022: The wound is smaller again this week and significantly cleaner. The wound surface is a little bit drier than ideal. 05/03/2022: The wound measurements were about the same, but visually it appears smaller. There is still some undermining at the top of the wound. Moisture balance is better this week. 05/10/2022: The wound measured smaller today. There is still a fair amount of undermining present. Slough has built up on the surface. We are still awaiting snap VAC approval. 05/17/2022: The wound is a little bit smaller today. There is less slough on the surface, but the granulation tissue still is not very robust. She has been approved for snap VAC but will have a 20% coinsurance and it is not clear what that amount would end up being for her. 05/27/2022: The surface of the wound has deteriorated and it is gray and fibrotic. No significant odor, but the drainage on her dressing was a little bit purulent. 05/31/2022: The wound looks quite a bit better today. It is still a little fibrotic but no longer has purulent-looking drainage. The color is better. She spoke with  her insurance company and is interested in trying the snap VAC, now that she is aware of the cost to her. 06/07/2022: After 1 week in the snap VAC, there has been substantial improvement to her wound. The undermined portion is closing in. The surface has a healthier color and appearance. There is very minimal slough accumulation. 06/14/2022: The more shallow part of the undermined portion of her wound has closed. There is still some undermining from about 1-2 o'clock. The  surface continues to improve. Minimal slough accumulation. 06/28/2022: The wound is shallower this week and the undermining has essentially closed and completely. The surface tissue is improving. 07/05/2022: The depth is almost immeasurable and the surface is nearly flush with the surrounding skin. The undermining has been eliminated. There is light [name], [name] ([id number]) [phone].pdf Page 9 of 14 slough on the wound surface. 07/12/2022: There is a little bit of slough on the wound surface. The depth is about the same as last week. 07/19/2022: The wound is essentially flush with the surrounding skin surface. There is slight slough present. No real change in the AP or transverse dimensions. 07/26/2022: The wound measured a little bit smaller today. It is flush with the surrounding skin surface and there is good granulation tissue present. Minimal slough and biofilm accumulation. 08/02/2022: The right posterior leg wound is a little bit smaller. There is a little slough accumulation. She reported a new wound to [place] today. It is on her left posterior calf. It has been present for about 6 weeks. She is not sure of how it occurred. She has been trying to manage it at home with topical Neosporin and Band-Aids. The fat layer is exposed. The surface is fibrotic and covered with slough. 08/09/2022: The right posterior leg wound is smaller again this week. There is good granulation tissue on the surface with minimal slough accumulation. The left posterior calf wound has built up a thick layer of slough. It is still quite fibrotic underneath the slough, but there is a little bit of a pink color beginning to emerge. Edema control is good. 08/16/2022: Both wounds are smaller this week. There is minimal slough on the right posterior leg wound. There is slough that has reaccumulated on the left posterior calf wound but there is a little bit more granulation tissue emerging. 08/23/2022: Both  wounds are about the same size this week, but the quality of the tissue and amount of granulation tissue on the left are both better. 08/30/2022: Both wounds measure smaller today, but there has been some tissue breakdown adjacent to the left leg wound. She says it has been itchy. There is a little bit of slough on the surface of both wounds. 09/06/2022: The right wound measures smaller today. There is minimal slough on the wound surface. The left wound is about the same size. The periwound tissue looks better this week. There is still a layer of fibrinous slough on the left wound. Edema control is good bilaterally. 6/7 the patient has wounds on bilateral calfs. We are using silver alginate on the right Iodoflex on the left under Urgo lite compression. The wounds are measuring smaller. 09/20/2022: The right wound is flush with the surrounding skin. There is good granulation tissue with some biofilm and thin eschar present. On the left, there is some slough accumulation and the patient says it has been a little itchy this week. 09/27/2022: No significant change to the right wound. The left wound measures a little smaller, but still has some  depth to it with slough accumulation. Patient History Information obtained from Patient. Family History Cancer - Mother, Diabetes - Father, Hypertension - Mother, Kidney Disease - Mother, Lung Disease - Father, Thyroid Problems - Paternal Grandparents, No family history of Heart Disease, Seizures, Stroke, Tuberculosis. Social History Never smoker, Marital Status - Married, Alcohol Use - Never, Drug Use - No History, Caffeine Use - Daily - coffee. Medical History Eyes Denies history of Cataracts, Glaucoma, Optic Neuritis Ear/Nose/Mouth/Throat Denies history of Chronic sinus problems/congestion, Middle ear problems Hematologic/Lymphatic Patient has history of Anemia - iron Denies history of Hemophilia, Human Immunodeficiency Virus, Lymphedema, Sickle Cell  Disease Respiratory Patient has history of Sleep Apnea - CPAP Denies history of Aspiration, Asthma, Chronic Obstructive Pulmonary Disease (COPD), Pneumothorax, Tuberculosis Cardiovascular Patient has history of Hypertension, Peripheral Venous Disease Denies history of Angina, Arrhythmia, Congestive Heart Failure, Coronary Artery Disease, Deep Vein Thrombosis, Hypotension, Myocardial Infarction, Peripheral Arterial Disease, Phlebitis, Vasculitis Gastrointestinal Denies history of Cirrhosis , Colitis, Crohns, Hepatitis A, Hepatitis B, Hepatitis C Endocrine Denies history of Type I Diabetes, Type II Diabetes Genitourinary Denies history of End Stage Renal Disease Immunological Denies history of Lupus Erythematosus, Raynauds, Scleroderma Integumentary (Skin) Denies history of History of Burn Musculoskeletal Denies history of Gout, Rheumatoid Arthritis, Osteoarthritis, Osteomyelitis Neurologic Denies history of Dementia, Neuropathy, Quadriplegia, Paraplegia, Seizure Disorder Oncologic Denies history of Received Chemotherapy, Received Radiation Psychiatric Denies history of Anorexia/bulimia, Confinement Anxiety Hospitalization/Surgery History - cholecystectomy 1980s. - nephrolithasis 1980s. - plate and rod in right elbow surgery 2010. 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 Vitals Time Taken: 1:16 AM, Height: 61 in, Weight: 277 lbs, BMI: 52.3, Temperature: 97.8 F, Pulse: 77 bpm, Respiratory Rate: 20 breaths/min, Blood Pressure: 122/78 mmHg. Respiratory Normal work of breathing on room air. General Notes: 09/27/2022: No significant change to the right wound. The left wound measures a little smaller, but still has some depth to it with slough accumulation. Integumentary (Hair,  Skin) Wound #2 status is Open. Original cause of wound was Insect Bite. The date acquired was: 11/21/2021. The wound has been in treatment 35 weeks. The wound is located on the Right,Posterior Lower Leg. The wound measures 2cm length x 1.9cm width x 0.1cm depth; 2.985cm^2 area and 0.298cm^3 volume. There is Fat Layer (Subcutaneous Tissue) exposed. There is no tunneling or undermining noted. There is a medium amount of serosanguineous drainage noted. The wound margin is flat and intact. There is medium (34-66%) red granulation within the wound bed. There is a medium (34-66%) amount of necrotic tissue within the wound bed including Adherent Slough. The periwound skin appearance had no abnormalities noted for texture. The periwound skin appearance had no abnormalities noted for moisture. The periwound skin appearance exhibited: Hemosiderin Staining. The periwound skin appearance did not exhibit: Atrophie Blanche, Ecchymosis, Rubor, Erythema. Periwound temperature was noted as No Abnormality. The periwound has tenderness on palpation. Wound #3 status is Open. Original cause of wound was Trauma. The date acquired was: 06/28/2022. The wound has been in treatment 8 weeks. The wound is located on the Left,Posterior Lower Leg. The wound measures 1.5cm length x 1cm width x 0.1cm depth; 1.178cm^2 area and 0.118cm^3 volume. There is Fat Layer (Subcutaneous Tissue) exposed. There is no tunneling or undermining noted. There is a medium amount of serosanguineous drainage noted. The wound margin is flat and intact. There is medium (34-66%) red  granulation within the wound bed. There is a medium (34-66%) amount of necrotic tissue within the wound bed including Adherent Slough. The periwound skin appearance had no abnormalities noted for texture. The periwound skin appearance had no abnormalities noted for moisture. The periwound skin appearance had no abnormalities noted for color. Periwound temperature was noted as No  Abnormality. The periwound has tenderness on palpation. 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 There was a Selective/Open Wound Non-Viable Tissue Debridement with a total area of 2.98 sq cm performed by [name], MD. With the following instrument(s): Curette to remove Non-Viable tissue/material. Material removed includes [facility] after achieving pain control using Lidocaine 4% Topical Solution. No specimens were taken. A time out was conducted at 13:50, prior to the start of the procedure. A Minimum amount of bleeding was controlled with Pressure. The procedure was tolerated well with a pain level of 3 throughout and a pain level of 2 following the procedure. Post Debridement Measurements: 2cm length x 1.9cm width x 0.1cm depth; 0.298cm^3 volume. Character of Wound/Ulcer Post Debridement is improved. Severity of Tissue Post Debridement is: Fat layer exposed. Post procedure Diagnosis Wound #2: Same as Pre-Procedure General Notes: scribed for Dr. Lady Gary by [name], RN. Pre-procedure diagnosis of Wound #2 is a Venous Leg Ulcer located on the Right,Posterior Lower Leg . There was a Double Layer Compression Therapy Procedure by [name], RN. Post procedure Diagnosis Wound #2: Same as Pre-Procedure Notes: urgo lite. Wound #3 Pre-procedure diagnosis of Wound #3 is a Venous Leg Ulcer located on the Left,Posterior Lower Leg .Severity of Tissue Pre Debridement is: Fat layer exposed. There was a Excisional Skin/Subcutaneous Tissue Debridement with a total area of 1.18 sq cm performed by  [name], MD. With the following instrument(s): Curette to remove Viable and Non-Viable tissue/material. Material removed includes Subcutaneous Tissue and Slough and after achieving pain control using Lidocaine 4% Topical Solution. No specimens were taken. A time out was conducted at 13:50, prior to the start of the procedure. A Minimum amount of bleeding was controlled with Pressure. The procedure was tolerated well with a pain level of 3 throughout and a pain level of 2 following the procedure. Post Debridement Measurements: 1.5cm length x 1cm width x 0.1cm depth; 0.118cm^3 volume. Character of Wound/Ulcer Post Debridement is improved. Severity of Tissue Post Debridement is: Fat layer exposed. Post procedure Diagnosis Wound #3: Same as Pre-Procedure General Notes: scribed for Dr. Lady Gary by [name], RN. [name], [name] ([id number]) [id number] Page 11 of 14 Pre-procedure diagnosis of Wound #3 is a Venous Leg Ulcer located on the Left,Posterior Lower Leg . There was a Double Layer Compression Therapy Procedure by [name], RN. Post procedure Diagnosis Wound #3: Same as Pre-Procedure Notes: urgo lite. Plan Follow-up Appointments: Return Appointment in 1 week. - Dr. Lady Gary RM 3 Friday 6/28 @ 11:00 am Anesthetic: (In clinic) Topical Lidocaine 4% applied to wound bed Bathing/ Shower/ Hygiene: May shower with protection but do not get wound dressing(s) wet. Protect dressing(s) with water repellant cover (for example, large plastic bag) or a cast cover and may then take shower. Edema Control - Lymphedema / SCD / Other: Elevate legs to  the level of the heart or above for 30 minutes daily and/or when sitting for 3-4 times a day throughout the day. Avoid standing for long periods of time. Exercise regularly Moisturize legs daily. WOUND #2: - Lower Leg Wound Laterality: Right, Posterior Cleanser: Soap and Water 1 x Per Week/30 Days Discharge  Instructions: May shower and wash wound with dial antibacterial soap and water prior to dressing change. Cleanser: Wound Cleanser 1 x Per Week/30 Days Discharge Instructions: Cleanse the wound with wound cleanser prior to applying a clean dressing using gauze sponges, not tissue or cotton balls. Peri-Wound Care: Sween Lotion (Moisturizing lotion) 1 x Per Week/30 Days Discharge Instructions: Apply moisturizing lotion as directed Topical: Gentamicin 1 x Per Week/30 Days Discharge Instructions: As directed by physician Topical: Mupirocin Ointment 1 x Per Week/30 Days Discharge Instructions: Apply Mupirocin (Bactroban) as instructed Prim Dressing: Promogran Prisma Matrix, 4.34 (sq in) (silver collagen) 1 x Per Week/30 Days ary Discharge Instructions: Moisten collagen with saline or hydrogel Secondary Dressing: Woven Gauze Sponge, Non-Sterile 4x4 in 1 x Per Week/30 Days Discharge Instructions: Apply over primary dressing as directed. Com pression Wrap: Urgo K2 Lite, (equivalent to a 3 layer) two layer compression system, regular 1 x Per Week/30 Days Discharge Instructions: Apply Urgo K2 Lite as directed (alternative to 3 layer compression). WOUND #3: - Lower Leg Wound Laterality: Left, Posterior Cleanser: Soap and Water 1 x Per Week/30 Days Discharge Instructions: May shower and wash wound with dial antibacterial soap and water prior to dressing change. Cleanser: Wound Cleanser 1 x Per Week/30 Days Discharge Instructions: Cleanse the wound with wound cleanser prior to applying a clean dressing using gauze sponges, not tissue or cotton balls. Peri-Wound Care: Sween Lotion (Moisturizing lotion) 1 x Per Week/30 Days Discharge Instructions: Apply moisturizing lotion as directed Topical: Gentamicin 1 x Per Week/30 Days Discharge Instructions: As directed by physician Topical: Mupirocin Ointment 1 x Per Week/30 Days Discharge Instructions: Apply Mupirocin (Bactroban) as instructed Prim Dressing:  Promogran Prisma Matrix, 4.34 (sq in) (silver collagen) 1 x Per Week/30 Days ary Discharge Instructions: Moisten collagen with saline or hydrogel Secondary Dressing: Woven Gauze Sponge, Non-Sterile 4x4 in 1 x Per Week/30 Days Discharge Instructions: Apply over primary dressing as directed. Com pression Wrap: Urgo K2 Lite, (equivalent to a 3 layer) two layer compression system, regular 1 x Per Week/30 Days Discharge Instructions: Apply Urgo K2 Lite as directed (alternative to 3 layer compression). 09/27/2022: No significant change to the right wound. The left wound measures a little smaller, but still has some depth to it with slough accumulation. I used a curette to debride slough from the right wound and slough and subcutaneous tissue from the left wound. I am going to add a little bit of topical gentamicin and mupirocin to each wound to see if suppression of normal skin flora helps move these along. Continue Prisma silver collagen and bilateral Urgo lite compression wraps. Follow-up in 1 week. Electronic Signature(s) Signed: 09/27/2022 2:03:58 PM By: [name] MD FACS Entered By: [name] on 09/27/2022 14:03:58 -------------------------------------------------------------------------------- HxROS Details Patient Name: Date of Service: [address] [name] 09/27/2022 1:15 PM Medical Record Number: [id number] Patient Account Number: [id number] Date of Birth/Sex: Treating RN: March 17, 1961 (62 y.o. Haven Pylant, [name] ([id number]) [id number] Page 12 of 14 Primary Care Provider: Nira Conn Other Clinician: Referring Provider: Treating Provider/Extender: Andrey Campanile in Treatment: 35 Information Obtained From Patient Eyes Medical History: Negative for: Cataracts; Glaucoma; Optic Neuritis Ear/Nose/Mouth/Throat Medical History: Negative for:  Chronic sinus problems/congestion; Middle ear  problems Hematologic/Lymphatic Medical History: Positive for: Anemia - iron Negative for: Hemophilia; Human Immunodeficiency Virus; Lymphedema; Sickle Cell Disease Respiratory Medical History: Positive for: Sleep Apnea - CPAP Negative for: Aspiration; Asthma; Chronic Obstructive Pulmonary Disease (COPD); Pneumothorax; Tuberculosis Cardiovascular Medical History: Positive for: Hypertension; Peripheral Venous Disease Negative for: Angina; Arrhythmia; Congestive Heart Failure; Coronary Artery Disease; Deep Vein Thrombosis; Hypotension; Myocardial Infarction; Peripheral Arterial Disease; Phlebitis; Vasculitis Gastrointestinal Medical History: Negative for: Cirrhosis ; Colitis; Crohns; Hepatitis A; Hepatitis B; Hepatitis C Endocrine Medical History: Negative for: Type I Diabetes; Type II Diabetes Genitourinary Medical History: Negative for: End Stage Renal Disease Past Medical History Notes: years ago kidney stones Immunological Medical History: Negative for: Lupus Erythematosus; Raynauds; Scleroderma Integumentary (Skin) Medical History: Negative for: History of Burn Musculoskeletal Medical History: Negative for: Gout; Rheumatoid Arthritis; Osteoarthritis; Osteomyelitis Neurologic Medical History: Negative for: Dementia; Neuropathy; Quadriplegia; Paraplegia; Seizure Disorder Oncologic Medical History: Negative for: Received Chemotherapy; Received Radiation Past Medical History Notes: skin Ca removed from back years ago KATERIA, CUTRONA ([id number]) [id number] Page 13 of 14 Psychiatric Medical History: Negative for: Anorexia/bulimia; Confinement Anxiety Immunizations Pneumococcal Vaccine: Received Pneumococcal Vaccination: No Implantable Devices None Hospitalization / Surgery History Type of Hospitalization/Surgery cholecystectomy 1980s nephrolithasis 1980s plate and rod in right elbow surgery 2010 Family and Social History Cancer: Yes  - Mother; Diabetes: Yes - Father; Heart Disease: No; Hypertension: Yes - Mother; Kidney Disease: Yes - Mother; Lung Disease: Yes - Father; Seizures: No; Stroke: No; Thyroid Problems: Yes - Paternal Grandparents; Tuberculosis: No; Never smoker; Marital Status - Married; Alcohol Use: Never; Drug Use: No History; Caffeine Use: Daily - coffee; Financial Concerns: No; Food, Clothing or Shelter Needs: No; Support System Lacking: No; Transportation Concerns: No Electronic Signature(s) Signed: 09/30/2022 9:35:48 AM By: [name] MD FACS Entered By: [name] on 09/27/2022 14:02:41 -------------------------------------------------------------------------------- SuperBill Details Patient Name: Date of Service: [name], [name] 09/27/2022 Medical Record Number: [id number] Patient Account Number: [id number] Date of Birth/Sex: Treating RN: [birth year] (62 y.o. 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 E66.01 Morbid (severe) obesity due to excess calories I10 Essential (primary) hypertension R60.0 Localized edema Facility Procedures : CPT4 Code: [id number] Description: 11042 - DEB SUBQ TISSUE 20 SQ CM/< ICD-10 Diagnosis Description L97.222 Non-pressure chronic ulcer of left calf with fat layer exposed Modifier: Quantity: 1 : CPT4 Code: [id number] Description: 97597 - DEBRIDE WOUND 1ST 20 SQ CM OR < ICD-10 Diagnosis Description L97.212 Non-pressure chronic ulcer of right calf with fat layer exposed Modifier: Quantity: 1 Physician Procedures : CPT4 Code Description Modifier [id number] 99214 - WC PHYS LEVEL 4 - EST PT 25 CHRISTOPHER, HINK ([id number])  [id number] Quantity: 1 27.pdf Page 14 of 14 : ICD-10 Diagnosis Description 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

## 2022-09-27 NOTE — Progress Notes (Signed)
CECELY, RENGEL ([id number]) [id number] Page 1 of 9 Visit Report for 09/27/2022 Arrival Information Details Patient Name: Date of Service: [place], [name] 09/27/2022 1:15 PM Medical Record Number: [id number] Patient Account Number: [id number] Date of Birth/Sex: Treating RN: 1960/07/07 (62 y.o. F) Primary Care Coltrane Tugwell: Nira Conn Other Clinician: Referring Abisai Coble: Treating Shakeitha Umbaugh/Extender: Andrey Campanile in Treatment: 35 Visit Information History Since Last Visit All ordered tests and consults were completed: No Patient Arrived: Ambulatory Added or deleted any medications: No Arrival Time: 13:15 Any new allergies or adverse reactions: No Accompanied By: self Had a fall or experienced change in No Transfer Assistance: None activities of daily living that may affect Patient Identification Verified: Yes risk of falls: Secondary Verification Process Completed: Yes Signs or symptoms of abuse/neglect since last visito No Patient Requires Transmission-Based Precautions: No Hospitalized since last visit: No Patient Has Alerts: No Implantable device outside of the clinic excluding No cellular tissue based products placed in the center since last visit: Has Dressing in Place as Prescribed: Yes Has Compression in Place as Prescribed: Yes Pain Present Now: No Electronic Signature(s) Signed: 09/27/2022 5:01:45 PM By: [name] RN, BSN Previous Signature: 09/27/2022 1:43:42 PM Version By: Dayton Scrape Entered By: [name] on 09/27/2022 13:44:08 -------------------------------------------------------------------------------- Compression Therapy Details Patient Name: Date of Service: [name], [name] 09/27/2022 1:15 PM Medical Record Number: [id number] Patient Account Number: [id number] Date of Birth/Sex: Treating RN: March 28, 1961 (62 y.o. [name] Primary Care Mann Skaggs: Nira Conn Other  Clinician: Referring Anneli Bing: Treating Otha Rickles/Extender: Andrey Campanile in Treatment: 35 Compression Therapy Performed for Wound Assessment: Wound #2 Right,Posterior Lower Leg Performed By: Clinician [name], RN Compression Type: Double Layer Post Procedure Diagnosis Same as Pre-procedure Notes Stephannie Li Electronic Signature(s) Signed: 09/27/2022 5:01:45 PM By: [name] RN, BSN Entered By: [name] on 09/27/2022 13:54:31 [name] ([id number]) [id number] Page 2 of 9 -------------------------------------------------------------------------------- Compression Therapy Details Patient Name: Date of Service: [name] 09/27/2022 1:15 PM Medical Record Number: [id number] Patient Account Number: [id number] Date of Birth/Sex: Treating RN: [birth year] (62 y.o. [name] Primary Care Oletta Buehring: Nira Conn Other Clinician: Referring Daquisha Clermont: Treating Irja Wheless/Extender: Andrey Campanile in Treatment: 35 Compression Therapy Performed for Wound Assessment: Wound #3 Left,Posterior Lower Leg Performed By: Clinician [name], RN Compression Type: Double Layer Post Procedure Diagnosis Same as Pre-procedure Notes Stephannie Li Electronic Signature(s) Signed: 09/27/2022 5:01:45 PM By: [name] RN, BSN Entered By: [name] on 09/27/2022 13:54:31 -------------------------------------------------------------------------------- Encounter Discharge Information Details Patient Name: Date of Service: [address] [name] 09/27/2022 1:15 PM Medical Record Number: [id number] Patient Account Number: [id number] Date of Birth/Sex: Treating RN: July 30, 1960 (62 y.o. [name] Primary Care Kyren Vaux: Nira Conn Other Clinician: Referring Kimberlynn Lumbra: Treating Jonnathan Birman/Extender: Andrey Campanile in Treatment: 35 Encounter Discharge  Information Items Post Procedure Vitals Discharge Condition: Stable Temperature (F): 97.8 Ambulatory Status: Ambulatory Pulse (bpm): 77 Discharge Destination: Home Respiratory Rate (breaths/min): 20 Transportation: Private Auto Blood Pressure (mmHg): 122/78 Accompanied By: self Schedule Follow-up Appointment: Yes Clinical Summary of Care: Patient Declined Electronic Signature(s) Signed: 09/27/2022 5:01:45 PM By: [name] RN, BSN Entered By: [name] on 09/27/2022 14:16:39 -------------------------------------------------------------------------------- Lower Extremity Assessment Details Patient Name: Date of Service: [place], [name] 09/27/2022 1:15 PM Medical Record Number: [id number] Patient Account Number: [id number] Date of Birth/Sex: Treating RN: [birth year] (62 y.o. [name] Primary Care Donye Campanelli: Nira Conn Other Clinician: Referring  Amiel Sharrow: Treating Hillarie Harrigan/Extender: Ilianna, Bown, Romie Minus ([id number]) [id number] Page 3 of 9 Weeks in Treatment: 35 Edema Assessment Assessed: [Left: No] [Right: No] Edema: [Left: Yes] [Right: Yes] Calf Left: Right: Point of Measurement: From Medial Instep 38.5 cm 40 cm Ankle Left: Right: Point of Measurement: From Medial Instep 22.5 cm 23.5 cm Vascular Assessment Pulses: Dorsalis Pedis Palpable: [Left:Yes] [Right:Yes] Electronic Signature(s) Signed: 09/27/2022 5:01:45 PM By: [name] RN, BSN Entered By: [name] on 09/27/2022 13:45:53 -------------------------------------------------------------------------------- Multi Wound Chart Details Patient Name: Date of Service: [name], [name] 09/27/2022 1:15 PM Medical Record Number: [id number] Patient Account Number: [id number] Date of Birth/Sex: Treating RN: 1961-03-15 (62 y.o. F) Primary Care Shams Fill: Nira Conn Other Clinician: Referring Anarely Nicholls: Treating  Keyonta Barradas/Extender: Andrey Campanile in Treatment: 35 Vital Signs Height(in): 61 Pulse(bpm): 77 Weight(lbs): 277 Blood Pressure(mmHg): 122/78 Body Mass Index(BMI): 52.3 Temperature(F): 97.8 Respiratory Rate(breaths/min): 20 [2:Photos:] [N/A:N/A] Right, Posterior Lower Leg Left, Posterior Lower Leg N/A Wound Location: Insect Bite Trauma N/A Wounding Event: Venous Leg Ulcer Venous Leg Ulcer N/A Primary Etiology: Anemia, Sleep Apnea, Hypertension, Anemia, Sleep Apnea, Hypertension, N/A Comorbid History: Peripheral Venous Disease Peripheral Venous Disease 11/21/2021 06/28/2022 N/A Date Acquired: 35 8 N/A Weeks of Treatment: Open Open N/A Wound Status: No No N/A Wound Recurrence: 1.7x1.1x0.1 1.8x1.2x0.1 N/A Measurements L x W x D (cm) 1.469 1.696 N/A A (cm) : rea 0.147 0.17 N/A Volume (cm) : -345.20% -27.80% N/A % Reduction in AreaKERRA, GUILFOIL ([id number]) [id number] Page 4 of 9 -48.50% -27.80% N/A % Reduction in Volume: Full Thickness Without Exposed Full Thickness Without Exposed N/A Classification: Support Structures Support Structures Medium Medium N/A Exudate Amount: Serosanguineous Serosanguineous N/A Exudate Type: red, brown red, brown N/A Exudate Color: Flat and Intact Flat and Intact N/A Wound Margin: Large (67-100%) Medium (34-66%) N/A Granulation Amount: Red, Hyper-granulation Red N/A Granulation Quality: None Present (0%) Medium (34-66%) N/A Necrotic Amount: Fat Layer (Subcutaneous Tissue): Yes Fat Layer (Subcutaneous Tissue): Yes N/A Exposed Structures: Fascia: No Fascia: No Tendon: No Tendon: No Muscle: No Muscle: No Joint: No Joint: No Bone: No Bone: No Medium (34-66%) Small (1-33%) N/A Epithelialization: Excoriation: No Rash: Yes N/A Periwound Skin Texture: Scarring: No No Abnormalities Noted No Abnormalities Noted N/A Periwound Skin Moisture: Hemosiderin Staining:  Yes Erythema: No N/A Periwound Skin Color: Atrophie Blanche: No Ecchymosis: No Erythema: No Rubor: No No Abnormality No Abnormality N/A Temperature: Yes Yes N/A Tenderness on Palpation: Treatment Notes Electronic Signature(s) Signed: 09/27/2022 1:47:53 PM By: [name] MD FACS Entered By: [name] on 09/27/2022 13:47:53 -------------------------------------------------------------------------------- Multi-Disciplinary Care Plan Details Patient Name: Date of Service: [place], [name] 09/27/2022 1:15 PM Medical Record Number: [id number] Patient Account Number: [id number] Date of Birth/Sex: Treating RN: March 26, 1961 (62 y.o. [name] Primary Care Janaysha Depaulo: Nira Conn Other Clinician: Referring Ellanore Vanhook: Treating Barb Shear/Extender: Andrey Campanile in Treatment: 35 Multidisciplinary Care Plan reviewed with physician Active Inactive Necrotic Tissue Nursing Diagnoses: Impaired tissue integrity related to necrotic/devitalized tissue Knowledge deficit related to management of necrotic/devitalized tissue Goals: Necrotic/devitalized tissue will be minimized in the wound bed Date Initiated: 01/23/2022 Target Resolution Date: 11/06/2022 Goal Status: Active Patient/caregiver will verbalize understanding of reason and process for debridement of necrotic tissue Date Initiated: 01/23/2022 Target Resolution Date: 11/06/2022 Goal Status: Active Interventions: Assess patient pain level pre-, during and post procedure and prior to discharge Provide education on necrotic tissue and debridement process Treatment Activities: Apply topical anesthetic as ordered : 01/23/2022 Notes:  DAVONA, KINOSHITA ([id number]) [id number] Page 5 of 9 Venous Leg Ulcer Nursing Diagnoses: Knowledge deficit related to disease process and management Potential for venous Insuffiency (use before diagnosis confirmed) Goals: Patient will  maintain optimal edema control Date Initiated: 07/05/2022 Target Resolution Date: 11/06/2022 Goal Status: Active Interventions: Assess peripheral edema status every visit. Compression as ordered Provide education on venous insufficiency Treatment Activities: Therapeutic compression applied : 07/05/2022 Notes: Wound/Skin Impairment Nursing Diagnoses: Impaired tissue integrity Knowledge deficit related to ulceration/compromised skin integrity Goals: Patient/caregiver will verbalize understanding of skin care regimen Date Initiated: 01/23/2022 Target Resolution Date: 11/06/2022 Goal Status: Active Interventions: Assess patient/caregiver ability to obtain necessary supplies Assess ulceration(s) every visit Treatment Activities: Skin care regimen initiated : 01/23/2022 Topical wound management initiated : 01/23/2022 Notes: Electronic Signature(s) Signed: 09/27/2022 5:01:45 PM By: [name] RN, BSN Entered By: [name] on 09/27/2022 13:08:39 -------------------------------------------------------------------------------- Pain Assessment Details Patient Name: Date of Service: [name], [name] 09/27/2022 1:15 PM Medical Record Number: [id number] Patient Account Number: [id number] Date of Birth/Sex: Treating RN: Feb 14, 1961 (62 y.o. F) Primary Care Randi [employer]: Nira Conn Other Clinician: Referring Lady Wisham: Treating Youcef Klas/Extender: Andrey Campanile in Treatment: 35 Active Problems Location of Pain Severity and Description of Pain Patient Has Paino No Site Locations Rate the pain. MERELYN, KLUMP ([id number]) [id number] Page 6 of 9 Rate the pain. Current Pain Level: 0 Pain Management and Medication Current Pain Management: Electronic Signature(s) Signed: 09/27/2022 5:01:45 PM By: [name] RN, BSN Previous Signature: 09/27/2022 1:43:42 PM Version By: Dayton Scrape Entered By: [name] on 09/27/2022  13:44:25 -------------------------------------------------------------------------------- Patient/Caregiver Education Details Patient Name: Date of Service: [name], [name] DA Rexene Edison 6/21/2024andnbsp1:15 PM Medical Record Number: [id number] Patient Account Number: [id number] Date of Birth/Gender: Treating RN: November 07, 1960 (62 y.o. [name] Primary Care Physician: Nira Conn Other Clinician: Referring Physician: Treating Physician/Extender: Andrey Campanile in Treatment: 35 Education Assessment Education Provided To: Patient Education Topics Provided Venous: Methods: Explain/Verbal Responses: Reinforcements needed, State content correctly Electronic Signature(s) Signed: 09/27/2022 5:01:45 PM By: [name] RN, BSN Entered By: [name] on 09/27/2022 13:08:59 -------------------------------------------------------------------------------- Wound Assessment Details Patient Name: Date of Service: [name], [name] 09/27/2022 1:15 PM Medical Record Number: [id number] Patient Account Number: [id number] Date of Birth/Sex: Treating RN: [birth year] (62 y.o. [name] Primary Care Monet North: Nira Conn Other Clinician: [name] ([id number]) [id number] Page 7 of 9 Referring Teigen Parslow: Treating Tynasia Mccaul/Extender: Andrey Campanile in Treatment: 35 Wound Status Wound Number: 2 Primary Venous Leg Ulcer Etiology: Wound Location: Right, Posterior Lower Leg Wound Status: Open Wounding Event: Insect Bite Comorbid Anemia, Sleep Apnea, Hypertension, Peripheral Venous Date Acquired: 11/21/2021 History: Disease Weeks Of Treatment: 35 Clustered Wound: No Wound Measurements Length: (cm) 2 Width: (cm) 1.9 Depth: (cm) 0.1 Area: (cm) 2.985 Volume: (cm) 0.298 % Reduction in Area: -804.5% % Reduction in Volume: -201% Epithelialization: Small (1-33%) Tunneling: No Undermining:  No Wound Description Classification: Full Thickness Without Exposed Support Structures Wound Margin: Flat and Intact Exudate Amount: Medium Exudate Type: Serosanguineous Exudate Color: red, brown Foul Odor After Cleansing: No Slough/Fibrino Yes Wound Bed Granulation Amount: Medium (34-66%) Exposed Structure Granulation Quality: Red Fascia Exposed: No Necrotic Amount: Medium (34-66%) Fat Layer (Subcutaneous Tissue) Exposed: Yes Necrotic Quality: Adherent Slough Tendon Exposed: No Muscle Exposed: No Joint Exposed: No Bone Exposed: No Periwound Skin Texture Texture Color No Abnormalities Noted: Yes No Abnormalities Noted: No Atrophie Blanche: No Moisture Ecchymosis: No No Abnormalities Noted: Yes Erythema: No Hemosiderin  Staining: Yes Rubor: No Temperature / Pain Temperature: No Abnormality Tenderness on Palpation: Yes Treatment Notes Wound #2 (Lower Leg) Wound Laterality: Right, Posterior Cleanser Soap and Water Discharge Instruction: May shower and wash wound with dial antibacterial soap and water prior to dressing change. Wound Cleanser Discharge Instruction: Cleanse the wound with wound cleanser prior to applying a clean dressing using gauze sponges, not tissue or cotton balls. Peri-Wound Care Sween Lotion (Moisturizing lotion) Discharge Instruction: Apply moisturizing lotion as directed Topical Gentamicin Discharge Instruction: As directed by physician Mupirocin Ointment Discharge Instruction: Apply Mupirocin (Bactroban) as instructed Primary Dressing Promogran Prisma Matrix, 4.34 (sq in) (silver collagen) Discharge Instruction: Moisten collagen with saline or hydrogel Secondary Dressing Woven Gauze Sponge, Non-Sterile 4x4 in JANILA, ARRAZOLA ([id number]) [id number] Page 8 of 9 Discharge Instruction: Apply over primary dressing as directed. Secured With Compression Wrap Urgo K2 Lite, (equivalent to a 3 layer) two layer compression  system, regular Discharge Instruction: Apply Urgo K2 Lite as directed (alternative to 3 layer compression). Compression Stockings Add-Ons Electronic Signature(s) Signed: 09/27/2022 5:01:45 PM By: [name] RN, BSN Previous Signature: 09/27/2022 1:43:42 PM Version By: Dayton Scrape Entered By: [name] on 09/27/2022 13:49:43 -------------------------------------------------------------------------------- Wound Assessment Details Patient Name: Date of Service: [place], [name] 09/27/2022 1:15 PM Medical Record Number: [id number] Patient Account Number: [id number] Date of Birth/Sex: Treating RN: [birth year] (61 y.o. [name] Primary Care Kentravious Lipford: Nira Conn Other Clinician: Referring Malvika Tung: Treating Nahomy Limburg/Extender: Andrey Campanile in Treatment: 35 Wound Status Wound Number: 3 Primary Venous Leg Ulcer Etiology: Wound Location: Left, Posterior Lower Leg Wound Status: Open Wounding Event: Trauma Comorbid Anemia, Sleep Apnea, Hypertension, Peripheral Venous Date Acquired: 06/28/2022 History: Disease Weeks Of Treatment: 8 Clustered Wound: No Wound Measurements Length: (cm) 1.5 Width: (cm) 1 Depth: (cm) 0.1 Area: (cm) 1.178 Volume: (cm) 0.118 % Reduction in Area: 11.2% % Reduction in Volume: 11.3% Epithelialization: Small (1-33%) Tunneling: No Undermining: No Wound Description Classification: Full Thickness Without Exposed Support Structures Wound Margin: Flat and Intact Exudate Amount: Medium Exudate Type: Serosanguineous Exudate Color: red, brown Foul Odor After Cleansing: No Slough/Fibrino Yes Wound Bed Granulation Amount: Medium (34-66%) Exposed Structure Granulation Quality: Red Fascia Exposed: No Necrotic Amount: Medium (34-66%) Fat Layer (Subcutaneous Tissue) Exposed: Yes Necrotic Quality: Adherent Slough Tendon Exposed: No Muscle Exposed: No Joint Exposed: No Bone Exposed: No Periwound Skin  Texture Texture Color No Abnormalities Noted: Yes No Abnormalities Noted: Yes Moisture Temperature / Pain No Abnormalities Noted: Yes Temperature: No Abnormality Tenderness on Palpation: Yes Treatment Notes BRAYLINN, GULDEN ([id number]) [id number] Page 9 of 9 Wound #3 (Lower Leg) Wound Laterality: Left, Posterior Cleanser Soap and Water Discharge Instruction: May shower and wash wound with dial antibacterial soap and water prior to dressing change. Wound Cleanser Discharge Instruction: Cleanse the wound with wound cleanser prior to applying a clean dressing using gauze sponges, not tissue or cotton balls. Peri-Wound Care Sween Lotion (Moisturizing lotion) Discharge Instruction: Apply moisturizing lotion as directed Topical Gentamicin Discharge Instruction: As directed by physician Mupirocin Ointment Discharge Instruction: Apply Mupirocin (Bactroban) as instructed Primary Dressing Promogran Prisma Matrix, 4.34 (sq in) (silver collagen) Discharge Instruction: Moisten collagen with saline or hydrogel Secondary Dressing Woven Gauze Sponge, Non-Sterile 4x4 in Discharge Instruction: Apply over primary dressing as directed. Secured With Compression Wrap Urgo K2 Lite, (equivalent to a 3 layer) two layer compression system, regular Discharge Instruction: Apply Urgo K2 Lite as directed (alternative to 3 layer compression). Compression Stockings Add-Ons Electronic Signature(s) Signed: 09/27/2022 5:01:45 PM  By: [name] RN, BSN Previous Signature: 09/27/2022 1:43:42 PM Version By: Dayton Scrape Entered By: [name] on 09/27/2022 13:50:03 -------------------------------------------------------------------------------- Vitals Details Patient Name: Date of Service: [address] [name] 09/27/2022 1:15 PM Medical Record Number: [id number] Patient Account Number: [id number] Date of Birth/Sex: Treating RN: 1960-11-22 (62 y.o. F) Primary Care Aura Bibby:  Nira Conn Other Clinician: Referring Giselle Brutus: Treating Fouad Taul/Extender: Andrey Campanile in Treatment: 35 Vital Signs Time Taken: 01:16 Temperature (F): 97.8 Height (in): 61 Pulse (bpm): 77 Weight (lbs): 277 Respiratory Rate (breaths/min): 20 Body Mass Index (BMI): 52.3 Blood Pressure (mmHg): 122/78 Reference Range: 80 - 120 mg / dl Electronic Signature(s) Signed: 09/27/2022 5:01:45 PM By: [name] RN, BSN Previous Signature: 09/27/2022 1:43:42 PM Version By: Dayton Scrape Entered By: [name] on 09/27/2022 13:44:14

## 2022-10-03 ENCOUNTER — Other Ambulatory Visit: Payer: Self-pay | Admitting: Family Medicine

## 2022-10-03 DIAGNOSIS — I10 Essential (primary) hypertension: Secondary | ICD-10-CM

## 2022-10-04 ENCOUNTER — Encounter (HOSPITAL_BASED_OUTPATIENT_CLINIC_OR_DEPARTMENT_OTHER): Payer: BC Managed Care – PPO | Admitting: General Surgery

## 2022-10-04 DIAGNOSIS — L97212 Non-pressure chronic ulcer of right calf with fat layer exposed: Secondary | ICD-10-CM | POA: Diagnosis not present

## 2022-10-04 NOTE — Progress Notes (Signed)
[name], [name] ([id number]) [id number] Page 1 of 14 Visit Report for 10/04/2022 Chief Complaint Document Details Patient Name: Date of Service: [place], [name] 10/04/2022 11:00 A M Medical Record Number: [id number] Patient Account Number: [id number] Date of Birth/Sex: Treating RN: [birth year] (62 y.o. F) Primary Care Provider: Nira Conn Other Clinician: Referring Provider: Treating Provider/Extender: Andrey Campanile in Treatment: 36 Information Obtained from: Patient Chief Complaint RLE ulcer Electronic Signature(s) Signed: 10/04/2022 11:45:03 AM By: [name] MD FACS Entered By: [name] on 10/04/2022 11:45:03 -------------------------------------------------------------------------------- Debridement Details Patient Name: Date of Service: [name], [name] 10/04/2022 11:00 A M Medical Record Number: [id number] Patient Account Number: [id number] Date of Birth/Sex: Treating RN: 21-Aug-1960 (62 y.o. F) Primary Care Provider: Nira Conn Other Clinician: Referring Provider: Treating Provider/Extender: Andrey Campanile in Treatment: 36 Debridement Performed for Assessment: Wound #3 Left,Posterior Lower Leg Performed By: Physician [name], MD Debridement Type: Debridement Severity of Tissue Pre Debridement: Fat layer exposed Level of Consciousness (Pre-procedure): Awake and Alert Pre-procedure Verification/Time Out Yes - 11:38 Taken: Start Time: 11:39 Pain Control: Lidocaine 4% T opical Solution Percent of Wound Bed Debrided: 100% T Area Debrided (cm): otal 1.02 Tissue and other material debrided: Non-Viable, Slough, Slough Level: Non-Viable Tissue Debridement Description: Selective/Open Wound Instrument: Curette Bleeding: Minimum Hemostasis Achieved: Pressure End Time: 11:42 Procedural Pain: 0 Post Procedural Pain: 0 Response to Treatment: Procedure was  tolerated well Level of Consciousness (Post- Awake and Alert procedure): Post Debridement Measurements of Total Wound Length: (cm) 1.3 Width: (cm) 1 Depth: (cm) 0.1 Volume: (cm) 0.102 Character of Wound/Ulcer Post Debridement: Improved [name], [name] ([id number]) [phone].pdf Page 2 of 14 Severity of Tissue Post Debridement: Fat layer exposed Post Procedure Diagnosis Same as Pre-procedure Notes Scribed for Dr Lady Gary by Brenton Grills RN. Electronic Signature(s) Signed: 10/04/2022 11:44:41 AM By: [name] MD FACS Entered By: [name] on 10/04/2022 11:44:41 -------------------------------------------------------------------------------- Debridement Details Patient Name: Date of Service: [name], [name] 10/04/2022 11:00 A M Medical Record Number: [id number] Patient Account Number: [id number] Date of Birth/Sex: Treating RN: 04/06/1961 (62 y.o. F) Primary Care Provider: Nira Conn Other Clinician: Referring Provider: Treating Provider/Extender: Andrey Campanile in Treatment: 36 Debridement Performed for Assessment: Wound #2 Right,Posterior Lower Leg Performed By: Physician [name], MD Debridement Type: Debridement Severity of Tissue Pre Debridement: Fat layer exposed Level of Consciousness (Pre-procedure): Awake and Alert Pre-procedure Verification/Time Out Yes - 11:38 Taken: Start Time: 11:39 Pain Control: Lidocaine 4% T opical Solution Percent of Wound Bed Debrided: 100% T Area Debrided (cm): otal 1.06 Tissue and other material debrided: Non-Viable, Slough, Slough Level: Non-Viable Tissue Debridement Description: Selective/Open Wound Instrument: Curette Bleeding: Minimum Hemostasis Achieved: Pressure End Time: 11:42 Procedural Pain: 0 Post Procedural Pain: 0 Response to Treatment: Procedure was tolerated well Level of Consciousness (Post- Awake and Alert procedure): Post Debridement  Measurements of Total Wound Length: (cm) 1.5 Width: (cm) 0.9 Depth: (cm) 0.1 Volume: (cm) 0.106 Character of Wound/Ulcer Post Debridement: Improved Severity of Tissue Post Debridement: Fat layer exposed Post Procedure Diagnosis Same as Pre-procedure Notes scribed for Dr. Lady Gary by Samuella Bruin, RN Electronic Signature(s) Signed: 10/04/2022 11:44:54 AM By: [name] MD FACS Previous Signature: 10/04/2022 11:44:27 AM Version By: [name] MD FACS Entered By: [name] on 10/04/2022 11:44:54 [name] ([id number]) [id number] Page 3 of 14 -------------------------------------------------------------------------------- HPI Details Patient Name: Date of Service: [name] Reader. 10/04/2022 11:00 A M Medical Record Number: [id number] Patient  Account Number: [id number] Date of Birth/Sex: Treating RN: 03-26-1961 (62 y.o. F) Primary Care Provider: Nira Conn Other Clinician: Referring Provider: Treating Provider/Extender: Andrey Campanile in Treatment: 36 History of Present Illness HPI Description: 02/16/2020 upon evaluation today patient actually appears to be doing poorly in regard to her left medial lower extremity ulcer. This is actually an area that she tells me she has had intermittent issues with over the years although has been closed for some time she typically uses compression right now she has juxta lite compression wraps. With that being said she tells me that this nonetheless open several weeks/months ago and has been given her trouble since. She does have a history of chronic venous insufficiency she is seeing specialist for this in the past she has had an ablation as well as sclerotherapy. With that being said she also has hypertension chronically which is managed by her primary care provider. In general she seems to be worsening overall with regard to the wound and states that she finally  realized that she needed to come in and have somebody look at this and not continue to try to manage this on her own. No fevers, chills, nausea, vomiting, or diarrhea. 02/23/2020 on evaluation today patient appears to be doing well with regard to her wound. This is showing some signs of improvement which is great news still were not quite at the point where I would like to be as far as the overall appearance of the wound is concerned but I do believe this is better than last week. I do believe the Iodoflex is helping as well. 03/08/2020 upon evaluation today patient appears to be doing well with regard to her wound. She has been tolerating the dressing changes without complication. Fortunately I feel like she has made great progress with the Iodoflex but I feel like it may be the point rest to switch to something else possibly a collagen- based dressing at this time. 03/15/2020 upon evaluation today patient appears to be doing excellent in regard to her leg ulcer. She has been tolerating the dressing changes without complication. Fortunately there is no signs of active infection. Overall she is measuring a little bit smaller today which is great news. 03/22/2020 upon evaluation today patient appears to be doing well with regard to her wound. She has been tolerating the dressing changes without complication. Fortunately there is no signs of active infection at this time. 03/28/2020; patient I do not usually see however she has a wound on the left anterior lower leg secondary to chronic venous insufficiency we have been using silver collagen under compression. She arrives in clinic with a nonviable surface requiring debridement 04/12/2020 upon evaluation today patient appears to be doing well all things considered with regard to her leg ulcer. She is tolerating the dressing changes without complication there is minimal dry skin around the edges of the wound that may be trapping and stopping some of the  events of the new skin I am can work on that today. Otherwise the surface of the wound appears to be doing excellent. 04/19/2020 upon evaluation today patient appears to be doing well with regard to her leg ulcer. She has been tolerating dressing changes without complication. Fortunately there is no signs of active infection at this time. No fever chills noted overall very pleased with how things seem to be progressing. 04/26/2020 on evaluation today patient appears to be doing well with regard to her wound currently. Is showing signs of excellent  improvement overall is filling in nicely and there does not appear to be any signs of infection. No fevers, chills, nausea, vomiting, or diarrhea. 05/03/2020 upon evaluation today patient appears to be doing well with regard to her wound on the leg. This overall showing signs of good improvement which is great she has some good epithelial growth and overall I think that things are moving in the correct direction. We likewise going to continue with the wound care measures as before since she seems to making such good improvement. 2/3; venous wound on the left medial leg. This is contracting. We are using Prisma and 3 layer compression. She has a stocking and waiting in the eventuality this heals. She is already using it on the right 05/17/2020 upon evaluation today patient appears to be doing well with regard to her leg ulcer. She has been tolerating the dressing changes without complication. Fortunately there is no signs of active infection which is great news and overall very pleased with where things stand today. No fevers, chills, nausea, vomiting, or diarrhea. 05/24/2020 upon evaluation today patient appears to be doing well with regard to her wound. Overall I feel like she is making excellent progress. There does not appear to be any signs of active infection which is great news. 05/31/2020 upon evaluation today patient appears to be doing well with regard to  her wound. There does not appear to be any signs of active infection which is great news overall I am extremely pleased with where things stand today. READMISSION 01/23/2022 She returns to clinic today with a new wound on her right posterior calf. She says that she was cleaning out an old shed near the middle of August this year and then noticed what seemed to be a bug bite on her right posterior calf. It was itchy, red, and raised. By the end of September, an ulcer had developed. She has been applying various topical creams such as hydrocortisone and others to the site. When it was not improving, she made an appointment in the wound care center. ABI in clinic today was 0.97. On her right posterior calf, there is a circular wound with necrotic fat and black eschar present. There is no purulent drainage or malodor. The periwound skin is in good condition with just a little induration that appears to be secondary to inflammation. 01/31/2022: The wound measures a little bit larger today, but overall is quite a bit cleaner. There is some undermining from 9:00 to 3:00. She is having some periwound itching and says that her wrap slid. 02/08/2022: Despite using an Unna boot first layer at the top of her wrap, it slid again and it looks like there is been some bruising at the wound site. The wound is about the same size in terms of dimensions. There is a fair amount of slough and nonviable tissue still present. Edema control is better than last week. 02/15/2022: The wound dimensions are about the same. There is less nonviable tissue present. The periwound erythema has improved. 02/22/2022: The wound has deteriorated over the past week. It is larger and the periwound is more edematous, erythematous, and indurated. It looks as though there has been more tissue breakdown with undermining present. She is having more pain. There is a foul odor coming from the wound. 02/26/2022: The wound looks quite a bit better  today and the odor is gone. I still do not have her culture data back, but she seems to be responding well to the [place], Dusty  H ([id number]) [id number] Page 4 of 14 Augmentin. 03/06/2022: Her wound continues to improve. There is still some slough accumulation, but the periwound skin is less inflamed. Her culture returned with a polymicrobial population including Pseudomonas and so levofloxacin was also prescribed. She did not understand why a second antibiotic was being added so she has not yet initiated this. 03/14/2022: The wound is about the same, to perhaps a little bit larger. There is a fair amount of slough accumulation on the surface, as well as some hypertrophic granulation tissue. She is still taking levofloxacin, but has completed taking Augmentin. She has her Jodie Echevaria compound with her today. 12/14; the patient has a significant circular wound on the posterior right calf. She is using Keystone and silver alginate under 3 layer compression. Her ABIs were within normal limits at 0.97. This may have been traumatic or an insect bite at the start I reviewed these records. 04/04/2022: Since I last saw the wound, it has contracted considerably. There is a fairly thick layer of slough on the surface, as it has not been debrided since the last time I did it. Edema control is excellent and the periwound skin is in much better condition. 04/12/2022: No significant change in the wound dimensions. It is filling with granulation tissue. Still with slough accumulation on the surface. 04/19/2022: The wound is smaller this week. There is some slough accumulation on the surface. 04/26/2022: The wound is smaller again this week and significantly cleaner. The wound surface is a little bit drier than ideal. 05/03/2022: The wound measurements were about the same, but visually it appears smaller. There is still some undermining at the top of the wound. Moisture balance is better this  week. 05/10/2022: The wound measured smaller today. There is still a fair amount of undermining present. Slough has built up on the surface. We are still awaiting snap VAC approval. 05/17/2022: The wound is a little bit smaller today. There is less slough on the surface, but the granulation tissue still is not very robust. She has been approved for snap VAC but will have a 20% coinsurance and it is not clear what that amount would end up being for her. 05/27/2022: The surface of the wound has deteriorated and it is gray and fibrotic. No significant odor, but the drainage on her dressing was a little bit purulent. 05/31/2022: The wound looks quite a bit better today. It is still a little fibrotic but no longer has purulent-looking drainage. The color is better. She spoke with her insurance company and is interested in trying the snap VAC, now that she is aware of the cost to her. 06/07/2022: After 1 week in the snap VAC, there has been substantial improvement to her wound. The undermined portion is closing in. The surface has a healthier color and appearance. There is very minimal slough accumulation. 06/14/2022: The more shallow part of the undermined portion of her wound has closed. There is still some undermining from about 1-2 o'clock. The surface continues to improve. Minimal slough accumulation. 06/28/2022: The wound is shallower this week and the undermining has essentially closed and completely. The surface tissue is improving. 07/05/2022: The depth is almost immeasurable and the surface is nearly flush with the surrounding skin. The undermining has been eliminated. There is light slough on the wound surface. 07/12/2022: There is a little bit of slough on the wound surface. The depth is about the same as last week. 07/19/2022: The wound is essentially flush with the surrounding skin surface. There  is slight slough present. No real change in the AP or transverse dimensions. 07/26/2022: The wound measured a  little bit smaller today. It is flush with the surrounding skin surface and there is good granulation tissue present. Minimal slough and biofilm accumulation. 08/02/2022: The right posterior leg wound is a little bit smaller. There is a little slough accumulation. She reported a new wound to [place] today. It is on her left posterior calf. It has been present for about 6 weeks. She is not sure of how it occurred. She has been trying to manage it at home with topical Neosporin and Band-Aids. The fat layer is exposed. The surface is fibrotic and covered with slough. 08/09/2022: The right posterior leg wound is smaller again this week. There is good granulation tissue on the surface with minimal slough accumulation. The left posterior calf wound has built up a thick layer of slough. It is still quite fibrotic underneath the slough, but there is a little bit of a pink color beginning to emerge. Edema control is good. 08/16/2022: Both wounds are smaller this week. There is minimal slough on the right posterior leg wound. There is slough that has reaccumulated on the left posterior calf wound but there is a little bit more granulation tissue emerging. 08/23/2022: Both wounds are about the same size this week, but the quality of the tissue and amount of granulation tissue on the left are both better. 08/30/2022: Both wounds measure smaller today, but there has been some tissue breakdown adjacent to the left leg wound. She says it has been itchy. There is a little bit of slough on the surface of both wounds. 09/06/2022: The right wound measures smaller today. There is minimal slough on the wound surface. The left wound is about the same size. The periwound tissue looks better this week. There is still a layer of fibrinous slough on the left wound. Edema control is good bilaterally. 6/7 the patient has wounds on bilateral calfs. We are using silver alginate on the right Iodoflex on the left under Urgo lite compression.  The wounds are measuring smaller. 09/20/2022: The right wound is flush with the surrounding skin. There is good granulation tissue with some biofilm and thin eschar present. On the left, there is some slough accumulation and the patient says it has been a little itchy this week. 09/27/2022: No significant change to the right wound. The left wound measures a little smaller, but still has some depth to it with slough accumulation. 10/04/2022: Nice improvement in both wounds. They are smaller and more superficial. The left wound has better granulation tissue and no longer has the hard fibrous surface. Electronic Signature(s) Signed: 10/04/2022 11:45:47 AM By: [name] MD FACS Entered By: [name] on 10/04/2022 11:45:47 [name] ([id number]) [id number] Page 5 of 14 -------------------------------------------------------------------------------- Physical Exam Details Patient Name: Date of Service: [name] Reader. 10/04/2022 11:00 A M Medical Record Number: [id number] Patient Account Number: [id number] Date of Birth/Sex: Treating RN: 07-06-1960 (62 y.o. F) Primary Care Provider: Nira Conn Other Clinician: Referring Provider: Treating Provider/Extender: Andrey Campanile in Treatment: 36 Constitutional . . . . no acute distress. Respiratory Normal work of breathing on room air. Notes 10/04/2022: Nice improvement in both wounds. They are smaller and more superficial. The left wound has better granulation tissue and no longer has the hard fibrous surface. Electronic Signature(s) Signed: 10/04/2022 11:46:16 AM By: [name] MD FACS Entered By: [name] on 10/04/2022 11:46:16 -------------------------------------------------------------------------------- Physician Orders  Details Patient Name: Date of Service: [name] Reader 10/04/2022 11:00 A M Medical Record Number: [id number] Patient Account  Number: [id number] Date of Birth/Sex: Treating RN: November 05, 1960 (62 y.o. 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 - Dr. Lady Gary RM 3 Return A Anesthetic (In clinic) Topical Lidocaine 4% applied to wound bed Bathing/ Shower/ Hygiene May shower with protection but do not get wound dressing(s) wet. Protect dressing(s) with water repellant cover (for example, large plastic bag) or a cast cover and may then take shower. Edema Control - Lymphedema / SCD / Other Bilateral Lower Extremities Elevate legs to the level of the heart or above for 30 minutes daily and/or when sitting for 3-4 times a day throughout the day. Avoid standing for long periods of time. Exercise regularly Moisturize legs daily. [name], [name] ([id number]) [id number] Page 6 of 14 Wound Treatment Wound #2 - Lower Leg Wound Laterality: Right, Posterior Cleanser: Soap and Water 1 x Per Week/30 Days Discharge Instructions: May shower and wash wound with dial antibacterial soap and water prior to dressing change. Cleanser: Wound Cleanser 1 x Per Week/30 Days Discharge Instructions: Cleanse the wound with wound cleanser prior to applying a clean dressing using gauze sponges, not tissue or cotton balls. Peri-Wound Care: Sween Lotion (Moisturizing lotion) 1 x Per Week/30 Days Discharge Instructions: Apply moisturizing lotion as directed Topical: Gentamicin 1 x Per Week/30 Days Discharge  Instructions: As directed by physician Topical: Mupirocin Ointment 1 x Per Week/30 Days Discharge Instructions: Apply Mupirocin (Bactroban) as instructed Prim Dressing: Promogran Prisma Matrix, 4.34 (sq in) (silver collagen) 1 x Per Week/30 Days ary Discharge Instructions: Moisten collagen with saline or hydrogel Secondary Dressing: Woven Gauze Sponge, Non-Sterile 4x4 in 1 x Per Week/30 Days Discharge Instructions: Apply over primary dressing as directed. Compression Wrap: Urgo K2 Lite, (equivalent to a 3 layer) two layer compression system, regular 1 x Per Week/30 Days Discharge Instructions: Apply Urgo K2 Lite as directed (alternative to 3 layer compression). Wound #3 - Lower Leg Wound Laterality: Left, Posterior Cleanser: Soap and Water 1 x Per Week/30 Days Discharge Instructions: May shower and wash wound with dial antibacterial soap and water prior to dressing change. Cleanser: Wound Cleanser 1 x Per Week/30 Days Discharge Instructions: Cleanse the wound with wound cleanser prior to applying a clean dressing using gauze sponges, not tissue or cotton balls. Peri-Wound Care: Sween Lotion (Moisturizing lotion) 1 x Per Week/30 Days Discharge Instructions: Apply moisturizing lotion as directed Topical: Gentamicin 1 x Per Week/30 Days Discharge Instructions: As directed by physician Topical: Mupirocin Ointment 1 x Per Week/30 Days Discharge Instructions: Apply Mupirocin (Bactroban) as instructed Prim Dressing: Promogran Prisma Matrix, 4.34 (sq in) (silver collagen) 1 x Per Week/30 Days ary Discharge Instructions: Moisten collagen with saline or hydrogel Secondary Dressing: Woven Gauze Sponge, Non-Sterile 4x4 in 1 x Per Week/30 Days Discharge Instructions: Apply over primary dressing as directed. Compression Wrap: Urgo K2 Lite, (equivalent to a 3 layer) two layer compression system, regular 1 x Per Week/30 Days Discharge Instructions: Apply Urgo K2 Lite as directed (alternative to 3 layer  compression). Electronic Signature(s)  Signed: 10/04/2022 12:00:09 PM By: [name] MD FACS Entered By: [name] on 10/04/2022 11:47:02 -------------------------------------------------------------------------------- Problem List Details Patient Name: Date of Service: [name], [name] 10/04/2022 11:00 A M Medical Record Number: [id number] Patient Account Number: [id number] Date of Birth/Sex: Treating RN: 1960-11-01 (62 y.o. Gevena Mart Primary Care Provider: Nira Conn Other Clinician: Referring Provider: Treating Provider/Extender: Andrey Campanile in Treatment: [address] EMILYGRACE, [place] ([id number]) [id number] Page 7 of 14 ICD-10 Encounter Code Description Active Date MDM Diagnosis L97.212 Non-pressure chronic ulcer of right calf with fat layer exposed 01/23/2022 No Yes L97.222 Non-pressure chronic ulcer of left calf with fat layer exposed 08/02/2022 No Yes E66.01 Morbid (severe) obesity due to excess calories 01/23/2022 No Yes I10 Essential (primary) hypertension 01/23/2022 No Yes R60.0 Localized edema 01/23/2022 No Yes Inactive Problems Resolved Problems Electronic Signature(s) Signed: 10/04/2022 11:44:10 AM By: [name] MD FACS Entered By: [name] on 10/04/2022 11:44:10 -------------------------------------------------------------------------------- Progress Note Details Patient Name: Date of Service: [name], [name] 10/04/2022 11:00 A M Medical Record Number: [id number] Patient Account Number: [id number] Date of Birth/Sex: Treating RN: 15-Sep-1960 (62 y.o. F) Primary Care Provider: Nira Conn Other Clinician: Referring Provider: Treating Provider/Extender: Andrey Campanile in Treatment: 36 Subjective Chief Complaint Information obtained from Patient RLE ulcer History of Present Illness (HPI) 02/16/2020 upon evaluation today patient  actually appears to be doing poorly in regard to her left medial lower extremity ulcer. This is actually an area that she tells me she has had intermittent issues with over the years although has been closed for some time she typically uses compression right now she has juxta lite compression wraps. With that being said she tells me that this nonetheless open several weeks/months ago and has been given her trouble since. She does have a history of chronic venous insufficiency she is seeing specialist for this in the past she has had an ablation as well as sclerotherapy. With that being said she also has hypertension chronically which is managed by her primary care provider. In general she seems to be worsening overall with regard to the wound and states that she finally realized that she needed to come in and have somebody look at this and not continue to try to manage this on her own. No fevers, chills, nausea, vomiting, or diarrhea. 02/23/2020 on evaluation today patient appears to be doing well with regard to her wound. This is showing some signs of improvement which is great news still were not quite at the point where I would like to be as far as the overall appearance of the wound is concerned but I do believe this is better than last week. I do believe the Iodoflex is helping as well. 03/08/2020 upon evaluation today patient appears to be doing well with regard to her wound. She has been tolerating the dressing changes without complication. Fortunately I feel like she has made great progress with the Iodoflex but I feel like it may be the point rest to switch to something else possibly a collagen- based dressing at this time. 03/15/2020 upon evaluation today patient appears to be doing excellent in regard to her leg ulcer. She has been tolerating the dressing changes without complication. Fortunately there is no signs of active infection. Overall she is measuring a little bit smaller today which  is great news. 03/22/2020 upon evaluation today patient appears to be doing well with regard to her wound. She has been tolerating the  dressing changes without complication. Fortunately there is no signs of active infection at this time. 03/28/2020; patient I do not usually see however she has a wound on the left anterior lower leg secondary to chronic venous insufficiency we have been using [name], [name] ([id number]) [phone].pdf Page 8 of 14 silver collagen under compression. She arrives in clinic with a nonviable surface requiring debridement 04/12/2020 upon evaluation today patient appears to be doing well all things considered with regard to her leg ulcer. She is tolerating the dressing changes without complication there is minimal dry skin around the edges of the wound that may be trapping and stopping some of the events of the new skin I am can work on that today. Otherwise the surface of the wound appears to be doing excellent. 04/19/2020 upon evaluation today patient appears to be doing well with regard to her leg ulcer. She has been tolerating dressing changes without complication. Fortunately there is no signs of active infection at this time. No fever chills noted overall very pleased with how things seem to be progressing. 04/26/2020 on evaluation today patient appears to be doing well with regard to her wound currently. Is showing signs of excellent improvement overall is filling in nicely and there does not appear to be any signs of infection. No fevers, chills, nausea, vomiting, or diarrhea. 05/03/2020 upon evaluation today patient appears to be doing well with regard to her wound on the leg. This overall showing signs of good improvement which is great she has some good epithelial growth and overall I think that things are moving in the correct direction. We likewise going to continue with the wound care measures as before since she seems to making such good  improvement. 2/3; venous wound on the left medial leg. This is contracting. We are using Prisma and 3 layer compression. She has a stocking and waiting in the eventuality this heals. She is already using it on the right 05/17/2020 upon evaluation today patient appears to be doing well with regard to her leg ulcer. She has been tolerating the dressing changes without complication. Fortunately there is no signs of active infection which is great news and overall very pleased with where things stand today. No fevers, chills, nausea, vomiting, or diarrhea. 05/24/2020 upon evaluation today patient appears to be doing well with regard to her wound. Overall I feel like she is making excellent progress. There does not appear to be any signs of active infection which is great news. 05/31/2020 upon evaluation today patient appears to be doing well with regard to her wound. There does not appear to be any signs of active infection which is great news overall I am extremely pleased with where things stand today. READMISSION 01/23/2022 She returns to clinic today with a new wound on her right posterior calf. She says that she was cleaning out an old shed near the middle of August this year and then noticed what seemed to be a bug bite on her right posterior calf. It was itchy, red, and raised. By the end of September, an ulcer had developed. She has been applying various topical creams such as hydrocortisone and others to the site. When it was not improving, she made an appointment in the wound care center. ABI in clinic today was 0.97. On her right posterior calf, there is a circular wound with necrotic fat and black eschar present. There is no purulent drainage or malodor. The periwound skin is in good condition with just a little induration  that appears to be secondary to inflammation. 01/31/2022: The wound measures a little bit larger today, but overall is quite a bit cleaner. There is some undermining from 9:00  to 3:00. She is having some periwound itching and says that her wrap slid. 02/08/2022: Despite using an Unna boot first layer at the top of her wrap, it slid again and it looks like there is been some bruising at the wound site. The wound is about the same size in terms of dimensions. There is a fair amount of slough and nonviable tissue still present. Edema control is better than last week. 02/15/2022: The wound dimensions are about the same. There is less nonviable tissue present. The periwound erythema has improved. 02/22/2022: The wound has deteriorated over the past week. It is larger and the periwound is more edematous, erythematous, and indurated. It looks as though there has been more tissue breakdown with undermining present. She is having more pain. There is a foul odor coming from the wound. 02/26/2022: The wound looks quite a bit better today and the odor is gone. I still do not have her culture data back, but she seems to be responding well to the Augmentin. 03/06/2022: Her wound continues to improve. There is still some slough accumulation, but the periwound skin is less inflamed. Her culture returned with a polymicrobial population including Pseudomonas and so levofloxacin was also prescribed. She did not understand why a second antibiotic was being added so she has not yet initiated this. 03/14/2022: The wound is about the same, to perhaps a little bit larger. There is a fair amount of slough accumulation on the surface, as well as some hypertrophic granulation tissue. She is still taking levofloxacin, but has completed taking Augmentin. She has her Jodie Echevaria compound with her today. 12/14; the patient has a significant circular wound on the posterior right calf. She is using Keystone and silver alginate under 3 layer compression. Her ABIs were within normal limits at 0.97. This may have been traumatic or an insect bite at the start I reviewed these records. 04/04/2022: Since I last saw  the wound, it has contracted considerably. There is a fairly thick layer of slough on the surface, as it has not been debrided since the last time I did it. Edema control is excellent and the periwound skin is in much better condition. 04/12/2022: No significant change in the wound dimensions. It is filling with granulation tissue. Still with slough accumulation on the surface. 04/19/2022: The wound is smaller this week. There is some slough accumulation on the surface. 04/26/2022: The wound is smaller again this week and significantly cleaner. The wound surface is a little bit drier than ideal. 05/03/2022: The wound measurements were about the same, but visually it appears smaller. There is still some undermining at the top of the wound. Moisture balance is better this week. 05/10/2022: The wound measured smaller today. There is still a fair amount of undermining present. Slough has built up on the surface. We are still awaiting snap VAC approval. 05/17/2022: The wound is a little bit smaller today. There is less slough on the surface, but the granulation tissue still is not very robust. She has been approved for snap VAC but will have a 20% coinsurance and it is not clear what that amount would end up being for her. 05/27/2022: The surface of the wound has deteriorated and it is gray and fibrotic. No significant odor, but the drainage on her dressing was a little bit purulent. 05/31/2022: The  wound looks quite a bit better today. It is still a little fibrotic but no longer has purulent-looking drainage. The color is better. She spoke with her insurance company and is interested in trying the snap VAC, now that she is aware of the cost to her. 06/07/2022: After 1 week in the snap VAC, there has been substantial improvement to her wound. The undermined portion is closing in. The surface has a healthier color and appearance. There is very minimal slough accumulation. 06/14/2022: The more shallow part of the  undermined portion of her wound has closed. There is still some undermining from about 1-2 o'clock. The surface continues to improve. Minimal slough accumulation. 06/28/2022: The wound is shallower this week and the undermining has essentially closed and completely. The surface tissue is improving. 07/05/2022: The depth is almost immeasurable and the surface is nearly flush with the surrounding skin. The undermining has been eliminated. There is light [name], [name] ([id number]) [id number] Page 9 of 14 slough on the wound surface. 07/12/2022: There is a little bit of slough on the wound surface. The depth is about the same as last week. 07/19/2022: The wound is essentially flush with the surrounding skin surface. There is slight slough present. No real change in the AP or transverse dimensions. 07/26/2022: The wound measured a little bit smaller today. It is flush with the surrounding skin surface and there is good granulation tissue present. Minimal slough and biofilm accumulation. 08/02/2022: The right posterior leg wound is a little bit smaller. There is a little slough accumulation. She reported a new wound to [place] today. It is on her left posterior calf. It has been present for about 6 weeks. She is not sure of how it occurred. She has been trying to manage it at home with topical Neosporin and Band-Aids. The fat layer is exposed. The surface is fibrotic and covered with slough. 08/09/2022: The right posterior leg wound is smaller again this week. There is good granulation tissue on the surface with minimal slough accumulation. The left posterior calf wound has built up a thick layer of slough. It is still quite fibrotic underneath the slough, but there is a little bit of a pink color beginning to emerge. Edema control is good. 08/16/2022: Both wounds are smaller this week. There is minimal slough on the right posterior leg wound. There is slough that has reaccumulated on the  left posterior calf wound but there is a little bit more granulation tissue emerging. 08/23/2022: Both wounds are about the same size this week, but the quality of the tissue and amount of granulation tissue on the left are both better. 08/30/2022: Both wounds measure smaller today, but there has been some tissue breakdown adjacent to the left leg wound. She says it has been itchy. There is a little bit of slough on the surface of both wounds. 09/06/2022: The right wound measures smaller today. There is minimal slough on the wound surface. The left wound is about the same size. The periwound tissue looks better this week. There is still a layer of fibrinous slough on the left wound. Edema control is good bilaterally. 6/7 the patient has wounds on bilateral calfs. We are using silver alginate on the right Iodoflex on the left under Urgo lite compression. The wounds are measuring smaller. 09/20/2022: The right wound is flush with the surrounding skin. There is good granulation tissue with some biofilm and thin eschar present. On the left, there is some slough accumulation and the patient says it  has been a little itchy this week. 09/27/2022: No significant change to the right wound. The left wound measures a little smaller, but still has some depth to it with slough accumulation. 10/04/2022: Nice improvement in both wounds. They are smaller and more superficial. The left wound has better granulation tissue and no longer has the hard fibrous surface. Patient History Information obtained from Patient. Family History Cancer - Mother, Diabetes - Father, Hypertension - Mother, Kidney Disease - Mother, Lung Disease - Father, Thyroid Problems - Paternal Grandparents, No family history of Heart Disease, Seizures, Stroke, Tuberculosis. Social History Never smoker, Marital Status - Married, Alcohol Use - Never, Drug Use - No History, Caffeine Use - Daily - coffee. Medical History Eyes Denies history of  Cataracts, Glaucoma, Optic Neuritis Ear/Nose/Mouth/Throat Denies history of Chronic sinus problems/congestion, Middle ear problems Hematologic/Lymphatic Patient has history of Anemia - iron Denies history of Hemophilia, Human Immunodeficiency Virus, Lymphedema, Sickle Cell Disease Respiratory Patient has history of Sleep Apnea - CPAP Denies history of Aspiration, Asthma, Chronic Obstructive Pulmonary Disease (COPD), Pneumothorax, Tuberculosis Cardiovascular Patient has history of Hypertension, Peripheral Venous Disease Denies history of Angina, Arrhythmia, Congestive Heart Failure, Coronary Artery Disease, Deep Vein Thrombosis, Hypotension, Myocardial Infarction, Peripheral Arterial Disease, Phlebitis, Vasculitis Gastrointestinal Denies history of Cirrhosis , Colitis, Crohns, Hepatitis A, Hepatitis B, Hepatitis C Endocrine Denies history of Type I Diabetes, Type II Diabetes Genitourinary Denies history of End Stage Renal Disease Immunological Denies history of Lupus Erythematosus, Raynauds, Scleroderma Integumentary (Skin) Denies history of History of Burn Musculoskeletal Denies history of Gout, Rheumatoid Arthritis, Osteoarthritis, Osteomyelitis Neurologic Denies history of Dementia, Neuropathy, Quadriplegia, Paraplegia, Seizure Disorder Oncologic Denies history of Received Chemotherapy, Received Radiation Psychiatric Denies history of Anorexia/bulimia, Confinement Anxiety Hospitalization/Surgery History - cholecystectomy 1980s. - nephrolithasis 1980s. - plate and rod in right elbow surgery 2010. 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 Vitals Time Taken: 11:03 AM, Height: 61 in, Weight: 277 lbs, BMI: 52.3, Temperature: 98.0 F, Pulse: 80 bpm, Respiratory Rate: 20  breaths/min, Blood Pressure: 125/86 mmHg. Respiratory Normal work of breathing on room air. General Notes: 10/04/2022: Nice improvement in both wounds. They are smaller and more superficial. The left wound has better granulation tissue and no longer has the hard fibrous surface. Integumentary (Hair, Skin) Wound #2 status is Open. Original cause of wound was Insect Bite. The date acquired was: 11/21/2021. The wound has been in treatment 36 weeks. The wound is located on the Right,Posterior Lower Leg. The wound measures 1.5cm length x 0.9cm width x 0.1cm depth; 1.06cm^2 area and 0.106cm^3 volume. There is Fat Layer (Subcutaneous Tissue) exposed. There is a medium amount of serosanguineous drainage noted. The wound margin is flat and intact. There is medium (34-66%) red granulation within the wound bed. There is a medium (34-66%) amount of necrotic tissue within the wound bed including Adherent Slough. The periwound skin appearance had no abnormalities noted for texture. The periwound skin appearance had no abnormalities noted for moisture. The periwound skin appearance exhibited: Hemosiderin Staining. The periwound skin appearance did not exhibit: Atrophie Blanche, Ecchymosis, Rubor, Erythema. Periwound temperature was noted as No Abnormality. The periwound has tenderness on palpation. Wound #3 status is Open. Original cause of wound was Trauma. The date acquired was: 06/28/2022. The wound has been in treatment 9 weeks. The wound is located on the Left,Posterior Lower Leg. The wound measures  1.3cm length x 1cm width x 0.1cm depth; 1.021cm^2 area and 0.102cm^3 volume. There is Fat Layer (Subcutaneous Tissue) exposed. There is a medium amount of serosanguineous drainage noted. The wound margin is flat and intact. There is medium (34-66%) red granulation within the wound bed. There is a medium (34-66%) amount of necrotic tissue within the wound bed including Adherent Slough. The periwound skin appearance  had no abnormalities noted for texture. The periwound skin appearance had no abnormalities noted for moisture. The periwound skin appearance had no abnormalities noted for color. Periwound temperature was noted as No Abnormality. The periwound has tenderness on palpation. 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 There was a Selective/Open Wound Non-Viable Tissue Debridement with a total area of 1.06 sq cm performed by [name], MD. With the following instrument(s): Curette to remove Non-Viable tissue/material. Material removed includes [facility] after achieving pain control using Lidocaine 4% Topical Solution. No specimens were taken. A time out was conducted at 11:38, prior to the start of the procedure. A Minimum amount of bleeding was controlled with Pressure. The procedure was tolerated well with a pain level of 0 throughout and a pain level of 0 following the procedure. Post Debridement Measurements: 1.5cm length x 0.9cm width x 0.1cm depth; 0.106cm^3 volume. Character of Wound/Ulcer Post Debridement is improved. Severity of Tissue Post Debridement is: Fat layer exposed. Post procedure Diagnosis Wound #2: Same as Pre-Procedure General Notes: scribed for Dr. Lady Gary by Samuella Bruin, RN. Pre-procedure diagnosis of Wound #2 is a Venous Leg Ulcer located on the Right,Posterior Lower Leg . There was a Three Layer Compression Therapy Procedure by Brenton Grills, RN. Post procedure Diagnosis Wound #2: Same as Pre-Procedure Wound #3 Pre-procedure diagnosis of Wound #3 is a Venous Leg Ulcer located  on the Left,Posterior Lower Leg .Severity of Tissue Pre Debridement is: Fat layer exposed. There was a Selective/Open Wound Non-Viable Tissue Debridement with a total area of 1.02 sq cm performed by [name], MD. With the following instrument(s): Curette to remove Non-Viable tissue/material. Material removed includes [facility] after achieving pain control using Lidocaine 4% Topical Solution. No specimens were taken. A time out was conducted at 11:38, prior to the start of the procedure. A Minimum amount of bleeding was controlled with Pressure. The procedure was tolerated well with a pain level of 0 throughout and a pain level of 0 following the procedure. Post Debridement Measurements: 1.3cm length x 1cm width x 0.1cm depth; 0.102cm^3 volume. Character of Wound/Ulcer Post Debridement is improved. Severity of Tissue Post Debridement is: Fat layer exposed. Post procedure Diagnosis Wound #3: Same as Pre-Procedure [name], [name] ([id number]) [phone].pdf Page 11 of 14 General Notes: Scribed for Dr Lady Gary by Brenton Grills RN.. Pre-procedure diagnosis of Wound #3 is a Venous Leg Ulcer located on the Left,Posterior Lower Leg . There was a Three Layer Compression Therapy Procedure by Brenton Grills, RN. Post procedure Diagnosis Wound #3: Same as Pre-Procedure Plan Follow-up Appointments: Return Appointment in 1 week. - Dr. Lady Gary RM 3 Anesthetic: (In clinic) Topical Lidocaine 4% applied to wound bed Bathing/ Shower/ Hygiene: May shower with protection but do not get wound dressing(s) wet. Protect dressing(s) with water repellant cover (  for example, large plastic bag) or a cast cover and may then take shower. Edema Control - Lymphedema / SCD / Other: Elevate legs to the level of the heart or above for 30 minutes daily and/or when sitting for 3-4 times a day throughout the day. Avoid standing for long periods of time. Exercise regularly Moisturize legs daily. WOUND  #2: - Lower Leg Wound Laterality: Right, Posterior Cleanser: Soap and Water 1 x Per Week/30 Days Discharge Instructions: May shower and wash wound with dial antibacterial soap and water prior to dressing change. Cleanser: Wound Cleanser 1 x Per Week/30 Days Discharge Instructions: Cleanse the wound with wound cleanser prior to applying a clean dressing using gauze sponges, not tissue or cotton balls. Peri-Wound Care: Sween Lotion (Moisturizing lotion) 1 x Per Week/30 Days Discharge Instructions: Apply moisturizing lotion as directed Topical: Gentamicin 1 x Per Week/30 Days Discharge Instructions: As directed by physician Topical: Mupirocin Ointment 1 x Per Week/30 Days Discharge Instructions: Apply Mupirocin (Bactroban) as instructed Prim Dressing: Promogran Prisma Matrix, 4.34 (sq in) (silver collagen) 1 x Per Week/30 Days ary Discharge Instructions: Moisten collagen with saline or hydrogel Secondary Dressing: Woven Gauze Sponge, Non-Sterile 4x4 in 1 x Per Week/30 Days Discharge Instructions: Apply over primary dressing as directed. Com pression Wrap: Urgo K2 Lite, (equivalent to a 3 layer) two layer compression system, regular 1 x Per Week/30 Days Discharge Instructions: Apply Urgo K2 Lite as directed (alternative to 3 layer compression). WOUND #3: - Lower Leg Wound Laterality: Left, Posterior Cleanser: Soap and Water 1 x Per Week/30 Days Discharge Instructions: May shower and wash wound with dial antibacterial soap and water prior to dressing change. Cleanser: Wound Cleanser 1 x Per Week/30 Days Discharge Instructions: Cleanse the wound with wound cleanser prior to applying a clean dressing using gauze sponges, not tissue or cotton balls. Peri-Wound Care: Sween Lotion (Moisturizing lotion) 1 x Per Week/30 Days Discharge Instructions: Apply moisturizing lotion as directed Topical: Gentamicin 1 x Per Week/30 Days Discharge Instructions: As directed by physician Topical: Mupirocin Ointment  1 x Per Week/30 Days Discharge Instructions: Apply Mupirocin (Bactroban) as instructed Prim Dressing: Promogran Prisma Matrix, 4.34 (sq in) (silver collagen) 1 x Per Week/30 Days ary Discharge Instructions: Moisten collagen with saline or hydrogel Secondary Dressing: Woven Gauze Sponge, Non-Sterile 4x4 in 1 x Per Week/30 Days Discharge Instructions: Apply over primary dressing as directed. Com pression Wrap: Urgo K2 Lite, (equivalent to a 3 layer) two layer compression system, regular 1 x Per Week/30 Days Discharge Instructions: Apply Urgo K2 Lite as directed (alternative to 3 layer compression). 10/04/2022: Nice improvement in both wounds. They are smaller and more superficial. The left wound has better granulation tissue and no longer has the hard fibrous surface. I used a curette to debride slough from both of the wounds. We will continue topical gentamicin and mupirocin to both with silver alginate and bilateral 3 layer compression equivalent. Follow-up in 1 week. Electronic Signature(s) Signed: 10/04/2022 11:47:35 AM By: [name] MD FACS Entered By: [name] on 10/04/2022 11:47:34 -------------------------------------------------------------------------------- HxROS Details Patient Name: Date of Service: [address] [name] 10/04/2022 11:00 A Velora Heckler ([id number]) [id number] Page 12 of 14 Medical Record Number: [id number] Patient Account Number: [id number] Date of Birth/Sex: Treating RN: [birth year] (62 y.o. F) Primary Care Provider: Nira Conn Other Clinician: Referring Provider: Treating Provider/Extender: Andrey Campanile in Treatment: 36 Information Obtained From Patient Eyes Medical History: Negative for: Cataracts; Glaucoma; Optic Neuritis Ear/Nose/Mouth/Throat Medical History: Negative for:  Chronic sinus problems/congestion; Middle ear problems Hematologic/Lymphatic Medical  History: Positive for: Anemia - iron Negative for: Hemophilia; Human Immunodeficiency Virus; Lymphedema; Sickle Cell Disease Respiratory Medical History: Positive for: Sleep Apnea - CPAP Negative for: Aspiration; Asthma; Chronic Obstructive Pulmonary Disease (COPD); Pneumothorax; Tuberculosis Cardiovascular Medical History: Positive for: Hypertension; Peripheral Venous Disease Negative for: Angina; Arrhythmia; Congestive Heart Failure; Coronary Artery Disease; Deep Vein Thrombosis; Hypotension; Myocardial Infarction; Peripheral Arterial Disease; Phlebitis; Vasculitis Gastrointestinal Medical History: Negative for: Cirrhosis ; Colitis; Crohns; Hepatitis A; Hepatitis B; Hepatitis C Endocrine Medical History: Negative for: Type I Diabetes; Type II Diabetes Genitourinary Medical History: Negative for: End Stage Renal Disease Past Medical History Notes: years ago kidney stones Immunological Medical History: Negative for: Lupus Erythematosus; Raynauds; Scleroderma Integumentary (Skin) Medical History: Negative for: History of Burn Musculoskeletal Medical History: Negative for: Gout; Rheumatoid Arthritis; Osteoarthritis; Osteomyelitis Neurologic Medical History: Negative for: Dementia; Neuropathy; Quadriplegia; Paraplegia; Seizure Disorder Oncologic Medical History: Negative for: Received Chemotherapy; Received Radiation [name], [name] ([id number]) [id number] Page 13 of 14 Past Medical History Notes: skin Ca removed from back years ago Psychiatric Medical History: Negative for: Anorexia/bulimia; Confinement Anxiety Immunizations Pneumococcal Vaccine: Received Pneumococcal Vaccination: No Implantable Devices None Hospitalization / Surgery History Type of Hospitalization/Surgery cholecystectomy 1980s nephrolithasis 1980s plate and rod in right elbow surgery 2010 Family and Social History Cancer: Yes - Mother; Diabetes: Yes - Father; Heart  Disease: No; Hypertension: Yes - Mother; Kidney Disease: Yes - Mother; Lung Disease: Yes - Father; Seizures: No; Stroke: No; Thyroid Problems: Yes - Paternal Grandparents; Tuberculosis: No; Never smoker; Marital Status - Married; Alcohol Use: Never; Drug Use: No History; Caffeine Use: Daily - coffee; Financial Concerns: No; Food, Clothing or Shelter Needs: No; Support System Lacking: No; Transportation Concerns: No Electronic Signature(s) Signed: 10/04/2022 12:00:09 PM By: [name] MD FACS Entered By: [name] on 10/04/2022 11:45:53 -------------------------------------------------------------------------------- SuperBill Details Patient Name: Date of Service: [name], [name] 10/04/2022 Medical Record Number: [id number] Patient Account Number: [id number] Date of Birth/Sex: Treating RN: 28-Apr-1960 (62 y.o. 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 Modifier: Quantity: 1 Physician Procedures : CPT4 Code Description Modifier [id number] - WC PHYS LEVEL 4 - EST PT 25 ICD-10 Diagnosis Description SHAMANDA, NIZIOLEK ([id number]) [phone] Non-pressure chronic ulcer of right calf with fat layer exposed L97.222  Non-pressure chronic ulcer of left  calf with fat layer exposed E66.01 Morbid (severe) obesity due to excess calories R60.0 Localized edema Quantity: 1 7.pdf Page 14 of 14 : [id number] 97597 - WC PHYS DEBR WO ANESTH 20 SQ CM 1 ICD-10 Diagnosis Description 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

## 2022-10-07 NOTE — Progress Notes (Signed)
VINEY, [name] ([id number]) [id number] Page 1 of 10 Visit Report for 10/04/2022 Arrival Information Details Patient Name: Date of Service: [name], [name] [name] H. 10/04/2022 11:00 A M Medical Record Number: [id number] Patient Account Number: [id number] Date of Birth/Sex: Treating RN: [birth year][name] (62 y.o. F) Primary Care [name]: [name] Other Clinician: Referring Carvell Hoeffner: Treating [name]/Extender: [name] in [name]: 36 Visit Information History Since Last Visit All ordered tests and consults were completed: No Patient Arrived: Ambulatory Added or deleted any medications: No Arrival Time: 11:02 Any new allergies or adverse reactions: No Accompanied By: self Had a fall or experienced change in No Transfer Assistance: None activities of daily living that may affect Patient Identification Verified: Yes risk of falls: Secondary Verification Process Completed: Yes Signs or symptoms of abuse/neglect since last visito No Patient Requires Transmission-Based Precautions: No Hospitalized since last visit: No Patient Has Alerts: No Implantable device outside of the clinic excluding No cellular tissue based products placed in the center since last visit: Pain Present Now: No Electronic Signature(s) Signed: 10/04/2022 3:04:54 PM By: Dayton Scrape Entered By: Dayton Scrape on 10/04/2022 11:03:22 -------------------------------------------------------------------------------- Compression Therapy Details Patient Name: Date of Service: [name], [name] 10/04/2022 11:00 A M Medical Record Number: [id number] Patient Account Number: [id number] Date of Birth/Sex: Treating RN: [birth year][name] (62 y.o. [name] Primary Care Mishawn Hemann: [name] Other Clinician: Referring [name]: Treating Aseel Truxillo/Extender: [name] in [name]: 36 Compression Therapy Performed for Wound Assessment:  Wound #2 Right,Posterior Lower Leg Performed By: Leighton Parody, RN Compression Type: Three Layer Post Procedure Diagnosis Same as Pre-procedure Electronic Signature(s) Signed: 10/07/2022 2:57:52 PM By: Brenton Grills Entered By: Brenton Grills on 10/04/2022 11:43:11 [name] ([id number]) [id number] Page 2 of 10 -------------------------------------------------------------------------------- Compression Therapy Details Patient Name: Date of Service: [place], [name] 10/04/2022 11:00 A M Medical Record Number: [id number] Patient Account Number: [id number] Date of Birth/Sex: Treating RN: [name][birth year] (62 y.o. [name] Primary Care Shermika Balthaser: [name] Other Clinician: Referring [name]: Treating [name]/Extender: [name] in [name]: 36 Compression Therapy Performed for Wound Assessment: Wound #3 Left,Posterior Lower Leg Performed By: Leighton Parody, RN Compression Type: Three Layer Post Procedure Diagnosis Same as Pre-procedure Electronic Signature(s) Signed: 10/07/2022 2:57:52 PM By: Brenton Grills Entered By: Brenton Grills on 10/04/2022 11:43:12 -------------------------------------------------------------------------------- Encounter Discharge Information Details Patient Name: Date of Service: [name], [name] 10/04/2022 11:00 A M Medical Record Number: [id number] Patient Account Number: [id number] Date of Birth/Sex: Treating RN: [birth year][name] (62 y.o. [name] Primary Care Ridhima Golberg: [name] Other Clinician: Referring [name]: Treating [name]/Extender: [name] in [name]: 36 Encounter Discharge Information Items Post Procedure Vitals Discharge Condition: Stable Temperature (F): 98.6 Ambulatory Status: Ambulatory Pulse (bpm): 82 Discharge Destination: Home Respiratory Rate (breaths/min): 18 Transportation: Private  Auto Blood Pressure (mmHg): 138/68 Accompanied By: self Schedule Follow-up Appointment: Yes Clinical Summary of Care: Patient Declined Electronic Signature(s) Signed: 10/07/2022 2:57:52 PM By: Brenton Grills Entered By: Brenton Grills on 10/04/2022 12:04:38 -------------------------------------------------------------------------------- Lower Extremity Assessment Details Patient Name: Date of Service: [name], [name] 10/04/2022 11:00 A M Medical Record Number: [id number] Patient Account Number: [id number] Date of Birth/Sex: Treating RN: [name] Primary Care Que Meneely: [name] Other Clinician: Referring Zayden Hahne: Treating [name]/Extender: [name]: 36 Edema Assessment Assessed: [name]: No] Franne Forts: No] Edema: [Left: Yes] [Right: Yes] [name] ([id number]) [phone].pdf Page 3 of  10 Left: Right: Point of Measurement: From Medial Instep 38.5 cm 40 cm Ankle Left: Right: Point of Measurement: From Medial Instep 22.5 cm 23.5 cm Vascular Assessment Pulses: Dorsalis Pedis Palpable: [Left:Yes] [Right:Yes] Electronic Signature(s) Signed: 10/07/2022 2:57:52 PM By: Brenton Grills Entered By: Brenton Grills on 10/04/2022 11:28:14 -------------------------------------------------------------------------------- Multi Wound Chart Details Patient Name: Date of Service: [name] [place], [name] 10/04/2022 11:00 A M Medical Record Number: [id number] Patient Account Number: [id number] Date of Birth/Sex: Treating RN: [birth year][name] (62 y.o. F) Primary Care Jru Pense: [name] Other Clinician: Referring Kaden Dunkel: Treating Tighe Gitto/Extender: [name] in [name]: 36 Vital Signs Height(in): 61 Pulse(bpm): 80 Weight(lbs): 277 Blood Pressure(mmHg): 125/86 Body Mass Index(BMI): 52.3 Temperature(F): 98.0 Respiratory Rate(breaths/min):  20 [2:Photos:] [N/A:N/A] Right, Posterior Lower Leg Left, Posterior Lower Leg N/A Wound Location: Insect Bite Trauma N/A Wounding Event: Venous Leg Ulcer Venous Leg Ulcer N/A Primary Etiology: Anemia, Sleep Apnea, Hypertension, Anemia, Sleep Apnea, Hypertension, N/A Comorbid History: Peripheral Venous Disease Peripheral Venous Disease 11/21/2021 06/28/2022 N/A Date Acquired: 36 9 N/A Weeks of [name]: Open Open N/A Wound Status: No No N/A Wound Recurrence: 1.5x0.9x0.1 1.3x1x0.1 N/A Measurements L x W x D (cm) 1.06 1.021 N/A A (cm) : rea 0.106 0.102 N/A Volume (cm) : -221.20% 23.10% N/A % Reduction in Area: -7.10% 23.30% N/A % Reduction in Volume: Full Thickness Without Exposed Full Thickness Without Exposed N/A Classification: Support Structures Support Structures Medium Medium N/A Exudate Amount: Serosanguineous Serosanguineous N/A Exudate Type: red, brown red, brown N/A Exudate Color: Flat and Intact Flat and Intact N/A Wound Margin: Medium (34-66%) Medium (34-66%) N/A Granulation Amount: Red Red N/A Granulation Quality: Medium (34-66%) Medium (34-66%) N/A Necrotic Amount: KITZIE, KEEBLER ([id number]) [id number] Page 4 of 10 Fat Layer (Subcutaneous Tissue): Yes Fat Layer (Subcutaneous Tissue): Yes N/A Exposed Structures: Fascia: No Fascia: No Tendon: No Tendon: No Muscle: No Muscle: No Joint: No Joint: No Bone: No Bone: No Small (1-33%) Small (1-33%) N/A Epithelialization: Debridement - Selective/Open Wound Debridement - Selective/Open Wound N/A Debridement: Pre-procedure Verification/Time Out 11:38 11:38 N/A Taken: Lidocaine 4% Topical Solution Lidocaine 4% Topical Solution N/A Pain Control: [facility] N/A Tissue Debrided: Non-Viable Tissue Non-Viable Tissue N/A Level: 1.06 1.02 N/A Debridement A (sq cm): rea Curette Curette N/A Instrument: Minimum Minimum N/A Bleeding: Pressure Pressure N/A Hemostasis A  chieved: 0 0 N/A Procedural Pain: 0 0 N/A Post Procedural Pain: Procedure was tolerated well Procedure was tolerated well N/A Debridement [name] Response: 1.5x0.9x0.1 1.3x1x0.1 N/A Post Debridement Measurements L x W x D (cm) 0.106 0.102 N/A Post Debridement Volume: (cm) Excoriation: No Rash: Yes N/A Periwound Skin Texture: Scarring: No No Abnormalities Noted No Abnormalities Noted N/A Periwound Skin Moisture: Hemosiderin Staining: Yes Erythema: No N/A Periwound Skin Color: Atrophie Blanche: No Ecchymosis: No Erythema: No Rubor: No No Abnormality No Abnormality N/A Temperature: Yes Yes N/A Tenderness on Palpation: Compression Therapy Compression Therapy N/A Procedures Performed: Debridement Debridement [name] Notes Electronic Signature(s) Signed: 10/04/2022 11:44:17 AM By: [name] MD FACS Entered By: [name] on 10/04/2022 11:44:16 -------------------------------------------------------------------------------- Multi-Disciplinary Care Plan Details Patient Name: Date of Service: [name] [place], [name] 10/04/2022 11:00 A M Medical Record Number: [id number] Patient Account Number: [id number] Date of Birth/Sex: Treating RN: [name][birth year] (62 y.o. [name] Primary Care Julanne Schlueter: [name] Other Clinician: Referring Ariyanah Aguado: Treating Kenitra Leventhal/Extender: [name] in [name]: 36 Multidisciplinary Care Plan reviewed with physician Active Inactive Necrotic Tissue Nursing Diagnoses: Impaired tissue integrity related to necrotic/devitalized tissue Knowledge deficit related to management  of necrotic/devitalized tissue Goals: Necrotic/devitalized tissue will be minimized in the wound bed Date Initiated: 01/23/2022 Target Resolution Date: 11/06/2022 Goal Status: Active Patient/caregiver will verbalize understanding of reason and process for debridement of necrotic tissue Date Initiated: 01/23/2022 Target  Resolution Date: 11/06/2022 Goal Status: Active Interventions: ENIYAH, VIVENZIO ([id number]) [id number] Page 5 of 10 Assess patient pain level pre-, during and post procedure and prior to discharge Provide education on necrotic tissue and debridement process [name] Activities: Apply topical anesthetic as ordered : 01/23/2022 Notes: Venous Leg Ulcer Nursing Diagnoses: Knowledge deficit related to disease process and management Potential for venous Insuffiency (use before diagnosis confirmed) Goals: Patient will maintain optimal edema control Date Initiated: 07/05/2022 Target Resolution Date: 11/06/2022 Goal Status: Active Interventions: Assess peripheral edema status every visit. Compression as ordered Provide education on venous insufficiency [name] Activities: Therapeutic compression applied : 07/05/2022 Notes: Wound/Skin Impairment Nursing Diagnoses: Impaired tissue integrity Knowledge deficit related to ulceration/compromised skin integrity Goals: Patient/caregiver will verbalize understanding of skin care regimen Date Initiated: 01/23/2022 Target Resolution Date: 11/06/2022 Goal Status: Active Interventions: Assess patient/caregiver ability to obtain necessary supplies Assess ulceration(s) every visit [name] Activities: Skin care regimen initiated : 01/23/2022 Topical wound management initiated : 01/23/2022 Notes: Electronic Signature(s) Signed: 10/07/2022 2:57:52 PM By: Brenton Grills Entered By: Brenton Grills on 10/04/2022 11:32:55 -------------------------------------------------------------------------------- Pain Assessment Details Patient Name: Date of Service: [name] [place], [name] 10/04/2022 11:00 A M Medical Record Number: [id number] Patient Account Number: [id number] Date of Birth/Sex: Treating RN: [name][birth year] (62 y.o. F) Primary Care Adama Ivins: [name] Other Clinician: Referring Eleshia Wooley: Treating Harden Bramer/Extender:  [name] in [name]: 36 Active Problems Location of Pain Severity and Description of Pain Patient Has Paino No Site Locations [place], [place] ([id number]) [id number] Page 6 of 10 Pain Management and Medication Current Pain Management: Electronic Signature(s) Signed: 10/04/2022 3:04:54 PM By: Dayton Scrape Entered By: Dayton Scrape on 10/04/2022 11:04:03 -------------------------------------------------------------------------------- Patient/Caregiver Education Details Patient Name: Date of Service: [name] [place], [name] 6/28/2024andnbsp11:00 A M Medical Record Number: [id number] Patient Account Number: [id number] Date of Birth/Gender: Treating RN: [birth year][name] (62 y.o. [name] Primary Care Physician: [name] Other Clinician: Referring Physician: Treating Physician/Extender: [name] in [name]: 36 Education Assessment Education Provided To: Patient Education Topics Provided Wound/Skin Impairment: Methods: Explain/Verbal Responses: State content correctly Electronic Signature(s) Signed: 10/07/2022 2:57:52 PM By: Brenton Grills Entered By: Brenton Grills on 10/04/2022 11:33:14 -------------------------------------------------------------------------------- Wound Assessment Details Patient Name: Date of Service: [name] [place], [name] 10/04/2022 11:00 A M Medical Record Number: [id number] Patient Account Number: [id number] Date of Birth/Sex: Treating RN: [name]-[birth year] (62 y.o. F) Primary Care Raylee Strehl: [name] Other Clinician: Referring Kimmie Berggren: Treating Athene Schuhmacher/Extender: [name], Romie Minus ([id number]) [id number] Page 7 of 10 Weeks in [name]: 36 Wound Status Wound Number: 2 Primary Venous Leg Ulcer Etiology: Wound Location: Right, Posterior Lower Leg Wound Status: Open Wounding Event: Insect  Bite Comorbid Anemia, Sleep Apnea, Hypertension, Peripheral Venous Date Acquired: 11/21/2021 History: Disease Weeks Of [name]: 36 Clustered Wound: No Photos Wound Measurements Length: (cm) 1.5 Width: (cm) 0.9 Depth: (cm) 0.1 Area: (cm) 1.06 Volume: (cm) 0.106 % Reduction in Area: -221.2% % Reduction in Volume: -7.1% Epithelialization: Small (1-33%) Wound Description Classification: Full Thickness Without Exposed Support Structures Wound Margin: Flat and Intact Exudate Amount: Medium Exudate Type: Serosanguineous Exudate Color: red, brown Foul Odor After Cleansing: No Slough/Fibrino Yes Wound Bed Granulation Amount: Medium (34-66%) Exposed Structure Granulation Quality: Red Fascia Exposed: No Necrotic Amount:  Medium (34-66%) Fat Layer (Subcutaneous Tissue) Exposed: Yes Necrotic Quality: Adherent Slough Tendon Exposed: No Muscle Exposed: No Joint Exposed: No Bone Exposed: No Periwound Skin Texture Texture Color No Abnormalities Noted: Yes No Abnormalities Noted: No Atrophie Blanche: No Moisture Ecchymosis: No No Abnormalities Noted: Yes Erythema: No Hemosiderin Staining: Yes Rubor: No Temperature / Pain Temperature: No Abnormality Tenderness on Palpation: Yes [name] Notes Wound #2 (Lower Leg) Wound Laterality: Right, Posterior Cleanser Soap and Water Discharge Instruction: May shower and wash wound with dial antibacterial soap and water prior to dressing change. Wound Cleanser Discharge Instruction: Cleanse the wound with wound cleanser prior to applying a clean dressing using gauze sponges, not tissue or cotton balls. Peri-Wound Care Sween Lotion (Moisturizing lotion) Discharge Instruction: Apply moisturizing lotion as directed JAMIAH, VILLAS ([id number]) [id number] Page 8 of 10 Topical Gentamicin Discharge Instruction: As directed by physician Mupirocin Ointment Discharge Instruction: Apply Mupirocin (Bactroban) as  instructed Primary Dressing Promogran Prisma Matrix, 4.34 (sq in) (silver collagen) Discharge Instruction: Moisten collagen with saline or hydrogel Secondary Dressing Woven Gauze Sponge, Non-Sterile 4x4 in Discharge Instruction: Apply over primary dressing as directed. Secured With Compression Wrap Urgo K2 Lite, (equivalent to a 3 layer) two layer compression system, regular Discharge Instruction: Apply Urgo K2 Lite as directed (alternative to 3 layer compression). Compression Stockings Add-Ons Electronic Signature(s) Signed: 10/04/2022 3:04:54 PM By: Dayton Scrape Entered By: Dayton Scrape on 10/04/2022 11:23:41 -------------------------------------------------------------------------------- Wound Assessment Details Patient Name: Date of Service: [place], SURA [name] 10/04/2022 11:00 A M Medical Record Number: [id number] Patient Account Number: [id number] Date of Birth/Sex: Treating RN: [birth year] (62 y.o. F) Primary Care Yola Paradiso: [name] Other Clinician: Referring Caison Hearn: Treating Naomie Crow/Extender: [name] in [name]: 36 Wound Status Wound Number: 3 Primary Venous Leg Ulcer Etiology: Wound Location: Left, Posterior Lower Leg Wound Status: Open Wounding Event: Trauma Comorbid Anemia, Sleep Apnea, Hypertension, Peripheral Venous Date Acquired: 06/28/2022 History: Disease Weeks Of [name]: 9 Clustered Wound: No Photos Wound Measurements Length: (cm) 1.3 Width: (cm) 1 Depth: (cm) 0.1 Area: (cm) 1.021 Volume: (cm) 0.102 AITZA, FRANEY ([id number]) Wound Description Classification: Full Thickness Without Exposed Support Structures Wound Margin: Flat and Intact Exudate Amount: Medium Exudate Type: Serosanguineous Exudate Color: red, brown Foul Odor After Cleansing: No Slough/Fibrino Yes % Reduction in Area: 23.1% % Reduction in Volume: 23.3% Epithelialization: Small (1-33%) [id number] Page 9 of  10 Wound Bed Granulation Amount: Medium (34-66%) Exposed Structure Granulation Quality: Red Fascia Exposed: No Necrotic Amount: Medium (34-66%) Fat Layer (Subcutaneous Tissue) Exposed: Yes Necrotic Quality: Adherent Slough Tendon Exposed: No Muscle Exposed: No Joint Exposed: No Bone Exposed: No Periwound Skin Texture Texture Color No Abnormalities Noted: Yes No Abnormalities Noted: Yes Moisture Temperature / Pain No Abnormalities Noted: Yes Temperature: No Abnormality Tenderness on Palpation: Yes [name] Notes Wound #3 (Lower Leg) Wound Laterality: Left, Posterior Cleanser Soap and Water Discharge Instruction: May shower and wash wound with dial antibacterial soap and water prior to dressing change. Wound Cleanser Discharge Instruction: Cleanse the wound with wound cleanser prior to applying a clean dressing using gauze sponges, not tissue or cotton balls. Peri-Wound Care Sween Lotion (Moisturizing lotion) Discharge Instruction: Apply moisturizing lotion as directed Topical Gentamicin Discharge Instruction: As directed by physician Mupirocin Ointment Discharge Instruction: Apply Mupirocin (Bactroban) as instructed Primary Dressing Promogran Prisma Matrix, 4.34 (sq in) (silver collagen) Discharge Instruction: Moisten collagen with saline or hydrogel Secondary Dressing Woven Gauze Sponge, Non-Sterile 4x4 in Discharge Instruction: Apply over primary dressing as directed. Secured With Compression Wrap  Urgo K2 Lite, (equivalent to a 3 layer) two layer compression system, regular Discharge Instruction: Apply Urgo K2 Lite as directed (alternative to 3 layer compression). Compression Stockings Add-Ons Electronic Signature(s) Signed: 10/04/2022 3:04:54 PM By: Dayton Scrape Entered By: Dayton Scrape on 10/04/2022 11:24:40 [name] ([id number]) [id number] Page 10 of  10 -------------------------------------------------------------------------------- Vitals Details Patient Name: Date of Service: [place], [name] 10/04/2022 11:00 A M Medical Record Number: [id number] Patient Account Number: [id number] Date of Birth/Sex: Treating RN: [birth year][name] (62 y.o. F) Primary Care Marketa Midkiff: [name] Other Clinician: Referring Inioluwa Boulay: Treating Damani Rando/Extender: [name] in [name]: 36 Vital Signs Time Taken: 11:03 Temperature (F): 98.0 Height (in): 61 Pulse (bpm): 80 Weight (lbs): 277 Respiratory Rate (breaths/min): 20 Body Mass Index (BMI): 52.3 Blood Pressure (mmHg): 125/86 Reference Range: 80 - 120 mg / dl Electronic Signature(s) Signed: 10/04/2022 3:04:54 PM By: Dayton Scrape Entered By: Dayton Scrape on 10/04/2022 11:03:47

## 2022-10-08 NOTE — Progress Notes (Signed)
LALONI, KASPRZYK ([id number]) [id number] Page 1 of 9 Visit Report for 09/13/2022 HPI Details Patient Name: Date of Service: Reinbeck, [name] 09/13/2022 2:00 PM Medical Record Number: [id number] Patient Account Number: [id number] Date of Birth/Sex: Treating RN: [birth year] (62 y.o. F) Primary Care Provider: Nira Conn Other Clinician: Referring Provider: Treating Provider/Extender: Trinidad Curet in Treatment: 33 History of Present Illness HPI Description: 02/16/2020 upon evaluation today patient actually appears to be doing poorly in regard to her left medial lower extremity ulcer. This is actually an area that she tells me she has had intermittent issues with over the years although has been closed for some time she typically uses compression right now she has juxta lite compression wraps. With that being said she tells me that this nonetheless open several weeks/months ago and has been given her trouble since. She does have a history of chronic venous insufficiency she is seeing specialist for this in the past she has had an ablation as well as sclerotherapy. With that being said she also has hypertension chronically which is managed by her primary care provider. In general she seems to be worsening overall with regard to the wound and states that she finally realized that she needed to come in and have somebody look at this and not continue to try to manage this on her own. No fevers, chills, nausea, vomiting, or diarrhea. 02/23/2020 on evaluation today patient appears to be doing well with regard to her wound. This is showing some signs of improvement which is great news still were not quite at the point where I would like to be as far as the overall appearance of the wound is concerned but I do believe this is better than last week. I do believe the Iodoflex is helping as well. 03/08/2020 upon evaluation today patient appears to be  doing well with regard to her wound. She has been tolerating the dressing changes without complication. Fortunately I feel like she has made great progress with the Iodoflex but I feel like it may be the point rest to switch to something else possibly a collagen- based dressing at this time. 03/15/2020 upon evaluation today patient appears to be doing excellent in regard to her leg ulcer. She has been tolerating the dressing changes without complication. Fortunately there is no signs of active infection. Overall she is measuring a little bit smaller today which is great news. 03/22/2020 upon evaluation today patient appears to be doing well with regard to her wound. She has been tolerating the dressing changes without complication. Fortunately there is no signs of active infection at this time. 03/28/2020; patient I do not usually see however she has a wound on the left anterior lower leg secondary to chronic venous insufficiency we have been using silver collagen under compression. She arrives in clinic with a nonviable surface requiring debridement 04/12/2020 upon evaluation today patient appears to be doing well all things considered with regard to her leg ulcer. She is tolerating the dressing changes without complication there is minimal dry skin around the edges of the wound that may be trapping and stopping some of the events of the new skin I am can work on that today. Otherwise the surface of the wound appears to be doing excellent. 04/19/2020 upon evaluation today patient appears to be doing well with regard to her leg ulcer. She has been tolerating dressing changes without complication. Fortunately there is no signs of active infection at this time. No fever chills  noted overall very pleased with how things seem to be progressing. 04/26/2020 on evaluation today patient appears to be doing well with regard to her wound currently. Is showing signs of excellent improvement overall is filling  in nicely and there does not appear to be any signs of infection. No fevers, chills, nausea, vomiting, or diarrhea. 05/03/2020 upon evaluation today patient appears to be doing well with regard to her wound on the leg. This overall showing signs of good improvement which is great she has some good epithelial growth and overall I think that things are moving in the correct direction. We likewise going to continue with the wound care measures as before since she seems to making such good improvement. 2/3; venous wound on the left medial leg. This is contracting. We are using Prisma and 3 layer compression. She has a stocking and waiting in the eventuality this heals. She is already using it on the right 05/17/2020 upon evaluation today patient appears to be doing well with regard to her leg ulcer. She has been tolerating the dressing changes without complication. Fortunately there is no signs of active infection which is great news and overall very pleased with where things stand today. No fevers, chills, nausea, vomiting, or diarrhea. 05/24/2020 upon evaluation today patient appears to be doing well with regard to her wound. Overall I feel like she is making excellent progress. There does not appear to be any signs of active infection which is great news. 05/31/2020 upon evaluation today patient appears to be doing well with regard to her wound. There does not appear to be any signs of active infection which is great news overall I am extremely pleased with where things stand today. READMISSION 01/23/2022 She returns to clinic today with a new wound on her right posterior calf. She says that she was cleaning out an old shed near the middle of August this year and then noticed what seemed to be a bug bite on her right posterior calf. It was itchy, red, and raised. By the end of September, an ulcer had developed. She has been applying various topical creams such as hydrocortisone and others to the site.  When it was not improving, she made an appointment in the wound care center. ABI in clinic today was 0.97. On her right posterior calf, there is a circular wound with necrotic fat and black eschar present. There is no purulent drainage or malodor. The periwound skin is in good condition with just a little induration that appears to be secondary to inflammation. 01/31/2022: The wound measures a little bit larger today, but overall is quite a bit cleaner. There is some undermining from 9:00 to 3:00. She is having some periwound itching and says that her wrap slid. 02/08/2022: Despite using an Unna boot first layer at the top of her wrap, it slid again and it looks like there is been some bruising at the wound site. The wound is about the same size in terms of dimensions. There is a fair amount of slough and nonviable tissue still present. Edema control is better than last week. 02/15/2022: The wound dimensions are about the same. There is less nonviable tissue present. The periwound erythema has improved. [name], [name] ([id number]) [id number] Page 2 of 9 02/22/2022: The wound has deteriorated over the past week. It is larger and the periwound is more edematous, erythematous, and indurated. It looks as though there has been more tissue breakdown with undermining present. She is having more pain. There is  a foul odor coming from the wound. 02/26/2022: The wound looks quite a bit better today and the odor is gone. I still do not have her culture data back, but she seems to be responding well to the Augmentin. 03/06/2022: Her wound continues to improve. There is still some slough accumulation, but the periwound skin is less inflamed. Her culture returned with a polymicrobial population including Pseudomonas and so levofloxacin was also prescribed. She did not understand why a second antibiotic was being added so she has not yet initiated this. 03/14/2022: The wound is about  the same, to perhaps a little bit larger. There is a fair amount of slough accumulation on the surface, as well as some hypertrophic granulation tissue. She is still taking levofloxacin, but has completed taking Augmentin. She has her Jodie Echevaria compound with her today. 12/14; the patient has a significant circular wound on the posterior right calf. She is using Keystone and silver alginate under 3 layer compression. Her ABIs were within normal limits at 0.97. This may have been traumatic or an insect bite at the start I reviewed these records. 04/04/2022: Since I last saw the wound, it has contracted considerably. There is a fairly thick layer of slough on the surface, as it has not been debrided since the last time I did it. Edema control is excellent and the periwound skin is in much better condition. 04/12/2022: No significant change in the wound dimensions. It is filling with granulation tissue. Still with slough accumulation on the surface. 04/19/2022: The wound is smaller this week. There is some slough accumulation on the surface. 04/26/2022: The wound is smaller again this week and significantly cleaner. The wound surface is a little bit drier than ideal. 05/03/2022: The wound measurements were about the same, but visually it appears smaller. There is still some undermining at the top of the wound. Moisture balance is better this week. 05/10/2022: The wound measured smaller today. There is still a fair amount of undermining present. Slough has built up on the surface. We are still awaiting snap VAC approval. 05/17/2022: The wound is a little bit smaller today. There is less slough on the surface, but the granulation tissue still is not very robust. She has been approved for snap VAC but will have a 20% coinsurance and it is not clear what that amount would end up being for her. 05/27/2022: The surface of the wound has deteriorated and it is gray and fibrotic. No significant odor, but the drainage on her  dressing was a little bit purulent. 05/31/2022: The wound looks quite a bit better today. It is still a little fibrotic but no longer has purulent-looking drainage. The color is better. She spoke with her insurance company and is interested in trying the snap VAC, now that she is aware of the cost to her. 06/07/2022: After 1 week in the snap VAC, there has been substantial improvement to her wound. The undermined portion is closing in. The surface has a healthier color and appearance. There is very minimal slough accumulation. 06/14/2022: The more shallow part of the undermined portion of her wound has closed. There is still some undermining from about 1-2 o'clock. The surface continues to improve. Minimal slough accumulation. 06/28/2022: The wound is shallower this week and the undermining has essentially closed and completely. The surface tissue is improving. 07/05/2022: The depth is almost immeasurable and the surface is nearly flush with the surrounding skin. The undermining has been eliminated. There is light slough on the wound surface.  07/12/2022: There is a little bit of slough on the wound surface. The depth is about the same as last week. 07/19/2022: The wound is essentially flush with the surrounding skin surface. There is slight slough present. No real change in the AP or transverse dimensions. 07/26/2022: The wound measured a little bit smaller today. It is flush with the surrounding skin surface and there is good granulation tissue present. Minimal slough and biofilm accumulation. 08/02/2022: The right posterior leg wound is a little bit smaller. There is a little slough accumulation. She reported a new wound to [place] today. It is on her left posterior calf. It has been present for about 6 weeks. She is not sure of how it occurred. She has been trying to manage it at home with topical Neosporin and Band-Aids. The fat layer is exposed. The surface is fibrotic and covered with slough. 08/09/2022: The  right posterior leg wound is smaller again this week. There is good granulation tissue on the surface with minimal slough accumulation. The left posterior calf wound has built up a thick layer of slough. It is still quite fibrotic underneath the slough, but there is a little bit of a pink color beginning to emerge. Edema control is good. 08/16/2022: Both wounds are smaller this week. There is minimal slough on the right posterior leg wound. There is slough that has reaccumulated on the left posterior calf wound but there is a little bit more granulation tissue emerging. 08/23/2022: Both wounds are about the same size this week, but the quality of the tissue and amount of granulation tissue on the left are both better. 08/30/2022: Both wounds measure smaller today, but there has been some tissue breakdown adjacent to the left leg wound. She says it has been itchy. There is a little bit of slough on the surface of both wounds. 09/06/2022: The right wound measures smaller today. There is minimal slough on the wound surface. The left wound is about the same size. The periwound tissue looks better this week. There is still a layer of fibrinous slough on the left wound. Edema control is good bilaterally. 6/7 the patient has wounds on bilateral calfs. We are using silver alginate on the right Iodoflex on the left under Urgo lite compression. The wounds are measuring smaller. Electronic Signature(s) Signed: 09/13/2022 4:34:22 PM By: Baltazar Najjar MD Entered By: Baltazar Najjar on 09/13/2022 14:58:08 [name] ([id number]) [id number] Page 3 of 9 -------------------------------------------------------------------------------- Physical Exam Details Patient Name: Date of Service: [name] 09/13/2022 2:00 PM Medical Record Number: [id number] Patient Account Number: [id number] Date of Birth/Sex: Treating RN: [birth year] (62 y.o. F) Primary Care Provider: Nira Conn Other Clinician: Referring Provider: Treating Provider/Extender: Trinidad Curet in Treatment: 33 Constitutional Sitting or standing Blood Pressure is within target range for patient.. Pulse regular and within target range for patient.Marland Kitchen Respirations regular, non-labored and within target range.. Temperature is normal and within the target range for the patient.Marland Kitchen Appears in no distress. Notes Wound exam; both areas appear smaller the slough is less these look like they are progressing towards healing rims of epithelialization. Her edema control is good Psychologist, prison and probation services) Signed: 09/13/2022 4:34:22 PM By: Baltazar Najjar MD Entered By: Baltazar Najjar on 09/13/2022 15:03:03 -------------------------------------------------------------------------------- Physician Orders Details Patient Name: Date of Service: [name], [name] 09/13/2022 2:00 PM Medical Record Number: [id number] Patient Account Number: [id number] Date of Birth/Sex: Treating RN: June 13, 1960 (62 y.o. 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 - Dr. Lady Gary RM 1 Return A Friday 6/14 @ 10:30 am Anesthetic (In clinic) Topical Lidocaine 4% applied to wound bed Bathing/ Shower/ Hygiene May shower with protection but do not get wound dressing(s) wet. Protect dressing(s) with water repellant cover (for example, large plastic bag) or a cast cover and may then take shower. Edema Control  - Lymphedema / SCD / Other Bilateral Lower Extremities Elevate legs to the level of the heart or above for 30 minutes daily and/or when sitting for 3-4 times a day throughout the day. Avoid standing for long periods of time. Exercise regularly Moisturize legs daily. Wound Treatment JACQUALINE, MCLAGAN ([id number]) [id number] Page 4 of 9 Wound #2 - Lower Leg Wound Laterality: Right, Posterior Cleanser: Soap and Water 1 x Per Week/30 Days Discharge Instructions: May shower and wash wound with dial antibacterial soap and water prior to dressing change. Cleanser: Wound Cleanser 1 x Per Week/30 Days Discharge Instructions: Cleanse the wound with wound cleanser prior to applying a clean dressing using gauze sponges, not tissue or cotton balls. Peri-Wound Care: Sween Lotion (Moisturizing lotion) 1 x Per Week/30 Days Discharge Instructions: Apply moisturizing lotion as directed Prim Dressing: Maxorb Extra Ag+ Alginate Dressing, 2x2 (in/in) 1 x Per Week/30 Days ary Discharge Instructions: Apply to wound bed as instructed Secondary Dressing: Woven Gauze Sponge, Non-Sterile 4x4 in 1 x Per Week/30 Days Discharge Instructions: Apply over primary dressing as directed. Compression Wrap: Urgo K2 Lite, (equivalent to a 3 layer) two layer compression system, regular 1 x Per Week/30 Days Discharge Instructions: Apply Urgo K2 Lite as directed (alternative to 3 layer compression). Wound #3 - Lower Leg Wound Laterality: Left, Posterior Cleanser: Soap and Water 1 x Per Week/30 Days Discharge Instructions: May shower and wash wound with dial antibacterial soap and water prior to dressing change. Cleanser: Wound Cleanser 1 x Per Week/30 Days Discharge Instructions: Cleanse the wound with wound cleanser prior to applying a clean dressing using gauze sponges, not tissue or cotton balls. Peri-Wound Care: Sween Lotion (Moisturizing lotion) 1 x Per Week/30 Days Discharge Instructions: Apply  moisturizing lotion as directed Prim Dressing: IODOFLEX 0.9% Cadexomer Iodine Pad 4x6 cm 1 x Per Week/30 Days ary Discharge Instructions: Apply to wound bed as instructed Secondary Dressing: Woven Gauze Sponge, Non-Sterile 4x4 in 1 x Per Week/30 Days Discharge Instructions: Apply over primary dressing as directed. Compression Wrap: Urgo K2 Lite, (equivalent to a 3 layer) two layer compression system, regular 1 x Per Week/30 Days Discharge Instructions: Apply Urgo K2 Lite as directed (alternative to 3 layer compression). Electronic Signature(s) Signed: 09/13/2022 4:34:22 PM By: Baltazar Najjar MD Signed: 09/13/2022 4:38:17 PM By: [name] RN, BSN Entered By: [name] on 09/13/2022 14:35:57 -------------------------------------------------------------------------------- Problem List Details Patient Name: Date of Service: [place], [name] 09/13/2022 2:00 PM Medical Record Number: [id number] Patient Account Number: [id number] Date of Birth/Sex: Treating RN: Nov 12, 1960 (62 y.o. [name] Primary Care Provider: Nira Conn Other Clinician: Referring Provider: Treating Provider/Extender: Trinidad Curet in Treatment: 33 Active Problems ICD-10 Encounter Code Description Active Date MDM Diagnosis L97.212 Non-pressure chronic ulcer of  right calf with fat layer exposed 01/23/2022 No Yes L97.222 Non-pressure chronic ulcer of left calf with fat layer exposed 08/02/2022 No Yes [name], [name] ([id number]) [id number].pdf Page 5 of 9 E66.01 Morbid (severe) obesity due to excess calories 01/23/2022 No Yes I10 Essential (primary) hypertension 01/23/2022 No Yes R60.0 Localized edema 01/23/2022 No Yes Inactive Problems Resolved Problems Electronic Signature(s) Signed: 09/13/2022 4:34:22 PM By: Baltazar Najjar MD Entered By: Baltazar Najjar on 09/13/2022  14:56:26 -------------------------------------------------------------------------------- Progress Note Details Patient Name: Date of Service: [name], [name] 09/13/2022 2:00 PM Medical Record Number: [id number] Patient Account Number: [id number] Date of Birth/Sex: Treating RN: May 11, 1960 (62 y.o. F) Primary Care Provider: Nira Conn Other Clinician: Referring Provider: Treating Provider/Extender: Trinidad Curet in Treatment: 33 Subjective History of Present Illness (HPI) 02/16/2020 upon evaluation today patient actually appears to be doing poorly in regard to her left medial lower extremity ulcer. This is actually an area that she tells me she has had intermittent issues with over the years although has been closed for some time she typically uses compression right now she has juxta lite compression wraps. With that being said she tells me that this nonetheless open several weeks/months ago and has been given her trouble since. She does have a history of chronic venous insufficiency she is seeing specialist for this in the past she has had an ablation as well as sclerotherapy. With that being said she also has hypertension chronically which is managed by her primary care provider. In general she seems to be worsening overall with regard to the wound and states that she finally realized that she needed to come in and have somebody look at this and not continue to try to manage this on her own. No fevers, chills, nausea, vomiting, or diarrhea. 02/23/2020 on evaluation today patient appears to be doing well with regard to her wound. This is showing some signs of improvement which is great news still were not quite at the point where I would like to be as far as the overall appearance of the wound is concerned but I do believe this is better than last week. I do believe the Iodoflex is helping as well. 03/08/2020 upon evaluation today patient appears to be doing  well with regard to her wound. She has been tolerating the dressing changes without complication. Fortunately I feel like she has made great progress with the Iodoflex but I feel like it may be the point rest to switch to something else possibly a collagen- based dressing at this time. 03/15/2020 upon evaluation today patient appears to be doing excellent in regard to her leg ulcer. She has been tolerating the dressing changes without complication. Fortunately there is no signs of active infection. Overall she is measuring a little bit smaller today which is great news. 03/22/2020 upon evaluation today patient appears to be doing well with regard to her wound. She has been tolerating the dressing changes without complication. Fortunately there is no signs of active infection at this time. 03/28/2020; patient I do not usually see however she has a wound on the left anterior lower leg secondary to chronic venous insufficiency we have been using silver collagen under compression. She arrives in clinic with a nonviable surface requiring debridement 04/12/2020 upon evaluation today patient appears to be doing well all things considered with regard to her leg ulcer. She is tolerating the dressing changes without complication there is minimal dry skin around the edges of the wound that may be  trapping and stopping some of the events of the new skin I am can work on that today. Otherwise the surface of the wound appears to be doing excellent. 04/19/2020 upon evaluation today patient appears to be doing well with regard to her leg ulcer. She has been tolerating dressing changes without complication. Fortunately there is no signs of active infection at this time. No fever chills noted overall very pleased with how things seem to be progressing. 04/26/2020 on evaluation today patient appears to be doing well with regard to her wound currently. Is showing signs of excellent improvement overall is filling in nicely and  there does not appear to be any signs of infection. No fevers, chills, nausea, vomiting, or diarrhea. 05/03/2020 upon evaluation today patient appears to be doing well with regard to her wound on the leg. This overall showing signs of good improvement which is great she has some good epithelial growth and overall I think that things are moving in the correct direction. We likewise going to continue with the wound care measures as before since she seems to making such good improvement. 2/3; venous wound on the left medial leg. This is contracting. We are using Prisma and 3 layer compression. She has a stocking and waiting in the eventuality this heals. She is already using it on the right 05/17/2020 upon evaluation today patient appears to be doing well with regard to her leg ulcer. She has been tolerating the dressing changes without complication. KIMBERLYANN, ARNALL ([id number]) [id number] Page 6 of 9 Fortunately there is no signs of active infection which is great news and overall very pleased with where things stand today. No fevers, chills, nausea, vomiting, or diarrhea. 05/24/2020 upon evaluation today patient appears to be doing well with regard to her wound. Overall I feel like she is making excellent progress. There does not appear to be any signs of active infection which is great news. 05/31/2020 upon evaluation today patient appears to be doing well with regard to her wound. There does not appear to be any signs of active infection which is great news overall I am extremely pleased with where things stand today. READMISSION 01/23/2022 She returns to clinic today with a new wound on her right posterior calf. She says that she was cleaning out an old shed near the middle of August this year and then noticed what seemed to be a bug bite on her right posterior calf. It was itchy, red, and raised. By the end of September, an ulcer had developed. She has been applying  various topical creams such as hydrocortisone and others to the site. When it was not improving, she made an appointment in the wound care center. ABI in clinic today was 0.97. On her right posterior calf, there is a circular wound with necrotic fat and black eschar present. There is no purulent drainage or malodor. The periwound skin is in good condition with just a little induration that appears to be secondary to inflammation. 01/31/2022: The wound measures a little bit larger today, but overall is quite a bit cleaner. There is some undermining from 9:00 to 3:00. She is having some periwound itching and says that her wrap slid. 02/08/2022: Despite using an Unna boot first layer at the top of her wrap, it slid again and it looks like there is been some bruising at the wound site. The wound is about the same size in terms of dimensions. There is a fair amount of slough and nonviable tissue still present.  Edema control is better than last week. 02/15/2022: The wound dimensions are about the same. There is less nonviable tissue present. The periwound erythema has improved. 02/22/2022: The wound has deteriorated over the past week. It is larger and the periwound is more edematous, erythematous, and indurated. It looks as though there has been more tissue breakdown with undermining present. She is having more pain. There is a foul odor coming from the wound. 02/26/2022: The wound looks quite a bit better today and the odor is gone. I still do not have her culture data back, but she seems to be responding well to the Augmentin. 03/06/2022: Her wound continues to improve. There is still some slough accumulation, but the periwound skin is less inflamed. Her culture returned with a polymicrobial population including Pseudomonas and so levofloxacin was also prescribed. She did not understand why a second antibiotic was being added so she has not yet initiated this. 03/14/2022: The wound is about the same, to  perhaps a little bit larger. There is a fair amount of slough accumulation on the surface, as well as some hypertrophic granulation tissue. She is still taking levofloxacin, but has completed taking Augmentin. She has her Jodie Echevaria compound with her today. 12/14; the patient has a significant circular wound on the posterior right calf. She is using Keystone and silver alginate under 3 layer compression. Her ABIs were within normal limits at 0.97. This may have been traumatic or an insect bite at the start I reviewed these records. 04/04/2022: Since I last saw the wound, it has contracted considerably. There is a fairly thick layer of slough on the surface, as it has not been debrided since the last time I did it. Edema control is excellent and the periwound skin is in much better condition. 04/12/2022: No significant change in the wound dimensions. It is filling with granulation tissue. Still with slough accumulation on the surface. 04/19/2022: The wound is smaller this week. There is some slough accumulation on the surface. 04/26/2022: The wound is smaller again this week and significantly cleaner. The wound surface is a little bit drier than ideal. 05/03/2022: The wound measurements were about the same, but visually it appears smaller. There is still some undermining at the top of the wound. Moisture balance is better this week. 05/10/2022: The wound measured smaller today. There is still a fair amount of undermining present. Slough has built up on the surface. We are still awaiting snap VAC approval. 05/17/2022: The wound is a little bit smaller today. There is less slough on the surface, but the granulation tissue still is not very robust. She has been approved for snap VAC but will have a 20% coinsurance and it is not clear what that amount would end up being for her. 05/27/2022: The surface of the wound has deteriorated and it is gray and fibrotic. No significant odor, but the drainage on her dressing was  a little bit purulent. 05/31/2022: The wound looks quite a bit better today. It is still a little fibrotic but no longer has purulent-looking drainage. The color is better. She spoke with her insurance company and is interested in trying the snap VAC, now that she is aware of the cost to her. 06/07/2022: After 1 week in the snap VAC, there has been substantial improvement to her wound. The undermined portion is closing in. The surface has a healthier color and appearance. There is very minimal slough accumulation. 06/14/2022: The more shallow part of the undermined portion of her wound has  closed. There is still some undermining from about 1-2 o'clock. The surface continues to improve. Minimal slough accumulation. 06/28/2022: The wound is shallower this week and the undermining has essentially closed and completely. The surface tissue is improving. 07/05/2022: The depth is almost immeasurable and the surface is nearly flush with the surrounding skin. The undermining has been eliminated. There is light slough on the wound surface. 07/12/2022: There is a little bit of slough on the wound surface. The depth is about the same as last week. 07/19/2022: The wound is essentially flush with the surrounding skin surface. There is slight slough present. No real change in the AP or transverse dimensions. 07/26/2022: The wound measured a little bit smaller today. It is flush with the surrounding skin surface and there is good granulation tissue present. Minimal slough and biofilm accumulation. 08/02/2022: The right posterior leg wound is a little bit smaller. There is a little slough accumulation. She reported a new wound to [place] today. It is on her left posterior calf. It has been present for about 6 weeks. She is not sure of how it occurred. She has been trying to manage it at home with topical Neosporin and Band-Aids. The fat layer is exposed. The surface is fibrotic and covered with slough. 08/09/2022: The right posterior  leg wound is smaller again this week. There is good granulation tissue on the surface with minimal slough accumulation. The left posterior calf wound has built up a thick layer of slough. It is still quite fibrotic underneath the slough, but there is a little bit of a pink color beginning to emerge. Edema control is good. 08/16/2022: Both wounds are smaller this week. There is minimal slough on the right posterior leg wound. There is slough that has reaccumulated on the left posterior calf wound but there is a little bit more granulation tissue emerging. [name], [name] ([id number]) [id number] Page 7 of 9 08/23/2022: Both wounds are about the same size this week, but the quality of the tissue and amount of granulation tissue on the left are both better. 08/30/2022: Both wounds measure smaller today, but there has been some tissue breakdown adjacent to the left leg wound. She says it has been itchy. There is a little bit of slough on the surface of both wounds. 09/06/2022: The right wound measures smaller today. There is minimal slough on the wound surface. The left wound is about the same size. The periwound tissue looks better this week. There is still a layer of fibrinous slough on the left wound. Edema control is good bilaterally. 6/7 the patient has wounds on bilateral calfs. We are using silver alginate on the right Iodoflex on the left under Urgo lite compression. The wounds are measuring smaller. Objective Constitutional Sitting or standing Blood Pressure is within target range for patient.. Pulse regular and within target range for patient.Marland Kitchen Respirations regular, non-labored and within target range.. Temperature is normal and within the target range for the patient.Marland Kitchen Appears in no distress. Vitals Time Taken: 2:10 PM, Height: 61 in, Weight: 277 lbs, BMI: 52.3, Temperature: 98.3 F, Pulse: 116 bpm, Respiratory Rate: 20 breaths/min, Blood Pressure: 130/88  mmHg. General Notes: Wound exam; both areas appear smaller the slough is less these look like they are progressing towards healing rims of epithelialization. Her edema control is good Integumentary (Hair, Skin) Wound #2 status is Open. Original cause of wound was Insect Bite. The date acquired was: 11/21/2021. The wound has been in treatment 33 weeks. The wound is located on  the Right,Posterior Lower Leg. The wound measures 1.6cm length x 1cm width x 0.1cm depth; 1.257cm^2 area and 0.126cm^3 volume. There is Fat Layer (Subcutaneous Tissue) exposed. There is a medium amount of serosanguineous drainage noted. The wound margin is flat and intact. There is medium (34-66%) red granulation within the wound bed. There is a medium (34-66%) amount of necrotic tissue within the wound bed including Adherent Slough. The periwound skin appearance had no abnormalities noted for texture. The periwound skin appearance had no abnormalities noted for moisture. The periwound skin appearance exhibited: Hemosiderin Staining. The periwound skin appearance did not exhibit: Atrophie Blanche, Ecchymosis, Rubor, Erythema. Periwound temperature was noted as No Abnormality. The periwound has tenderness on palpation. Wound #3 status is Open. Original cause of wound was Trauma. The date acquired was: 06/28/2022. The wound has been in treatment 6 weeks. The wound is located on the Left,Posterior Lower Leg. The wound measures 1.7cm length x 1.1cm width x 0.1cm depth; 1.469cm^2 area and 0.147cm^3 volume. There is Fat Layer (Subcutaneous Tissue) exposed. There is no tunneling or undermining noted. There is a medium amount of serosanguineous drainage noted. The wound margin is flat and intact. There is large (67-100%) red granulation within the wound bed. There is a small (1-33%) amount of necrotic tissue within the wound bed including Adherent Slough. The periwound skin appearance had no abnormalities noted for texture. The periwound  skin appearance had no abnormalities noted for moisture. The periwound skin appearance had no abnormalities noted for color. Periwound temperature was noted as No Abnormality. The periwound has tenderness on palpation. 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 There was a Double Layer Compression Therapy Procedure by [name], RN. Post procedure Diagnosis Wound #2: Same as Pre-Procedure Notes: urgo lite. Wound #3 Pre-procedure diagnosis of Wound #3 is a Venous Leg Ulcer located on the Left,Posterior Lower Leg . There was a Double Layer Compression Therapy Procedure by [name], RN. Post procedure Diagnosis Wound #3: Same as Pre-Procedure Notes: urgo lite. JOMARY, RUFFING ([id number]) [id number] Page 8 of 9 Plan Follow-up Appointments: Return Appointment in 1 week. - Dr. Lady Gary RM 1 Friday 6/14 @ 10:30 am Anesthetic: (In clinic) Topical Lidocaine 4% applied to wound bed Bathing/ Shower/ Hygiene: May shower with protection but do not get wound dressing(s) wet. Protect dressing(s) with water repellant cover (for example, large plastic bag) or a cast cover and may then take shower. Edema Control - Lymphedema / SCD / Other: Elevate legs to the level of the heart or above for 30 minutes daily and/or when sitting for 3-4 times a day throughout the day. Avoid standing for long periods of time. Exercise regularly Moisturize legs daily. WOUND #2: - Lower Leg Wound Laterality: Right, Posterior Cleanser: Soap and Water 1 x Per Week/30 Days Discharge Instructions: May shower and wash wound with dial antibacterial soap  and water prior to dressing change. Cleanser: Wound Cleanser 1 x Per Week/30 Days Discharge Instructions: Cleanse the wound with wound cleanser prior to applying a clean dressing using gauze sponges, not tissue or cotton balls. Peri-Wound Care: Sween Lotion (Moisturizing lotion) 1 x Per Week/30 Days Discharge Instructions: Apply moisturizing lotion as directed Prim Dressing: Maxorb Extra Ag+ Alginate Dressing, 2x2 (in/in)  1 x Per Week/30 Days ary Discharge Instructions: Apply to wound bed as instructed Secondary Dressing: Woven Gauze Sponge, Non-Sterile 4x4 in 1 x Per Week/30 Days Discharge Instructions: Apply over primary dressing as directed. Com pression Wrap: Urgo K2 Lite, (equivalent to a 3 layer) two layer compression system, regular 1 x Per Week/30 Days Discharge Instructions: Apply Urgo K2 Lite as directed (alternative to 3 layer compression). WOUND #3: - Lower Leg Wound Laterality: Left, Posterior Cleanser: Soap and Water 1 x Per Week/30 Days Discharge Instructions: May shower and wash wound with dial antibacterial soap and water prior to dressing change. Cleanser: Wound Cleanser 1 x Per Week/30 Days Discharge Instructions: Cleanse the wound with wound cleanser prior to applying a clean dressing using gauze sponges, not tissue or cotton balls. Peri-Wound Care: Sween Lotion (Moisturizing lotion) 1 x Per Week/30 Days Discharge Instructions: Apply moisturizing lotion as directed Prim Dressing: IODOFLEX 0.9% Cadexomer Iodine Pad 4x6 cm 1 x Per Week/30 Days ary Discharge Instructions: Apply to wound bed as instructed Secondary Dressing: Woven Gauze Sponge, Non-Sterile 4x4 in 1 x Per Week/30 Days Discharge Instructions: Apply over primary dressing as directed. Com pression Wrap: Urgo K2 Lite, (equivalent to a 3 layer) two layer compression system, regular 1 x Per Week/30 Days Discharge Instructions: Apply Urgo K2 Lite as directed (alternative to 3 layer compression). 1. We will continue  with the same dressings under the same compression 2. The patient seems close to healing. Electronic Signature(s) Signed: 09/13/2022 4:34:22 PM By: Baltazar Najjar MD Entered By: Baltazar Najjar on 09/13/2022 15:03:41 -------------------------------------------------------------------------------- SuperBill Details Patient Name: Date of Service: [address], [name] 09/13/2022 Medical Record Number: [id number] Patient Account Number: [id number] Date of Birth/Sex: Treating RN: [birth year] (62 y.o. F) Primary Care Provider: Nira Conn Other Clinician: Referring Provider: Treating Provider/Extender: Trinidad Curet in Treatment: 33 Diagnosis Coding ICD-10 Codes Code Description [phone] Non-pressure chronic ulcer of right calf with fat layer exposed L97.222 Non-pressure chronic ulcer of left calf with fat layer exposed E66.01 Morbid (severe) obesity due to excess calories I10 Essential (primary) hypertension R60.0 Localized edema Facility Procedures [name], [name] ([id number]): CPT4 Description Code [id number] [phone] BILATERAL: Application of multi-layer venous compression system; leg (below knee), including an foot. ICD-10 Diagnosis Description L97.212 Non-pressure chronic ulcer of right calf with fat layer exposed L97.222  Non-pressure chronic ulcer of left calf with fat layer exposed R60.0 Localized edema [id number] Page 9 of 9: Modifier Quantity kle and 1 Physician Procedures : CPT4 Code Description Modifier [id number] - WC PHYS LEVEL 3 - EST PT ICD-10 Diagnosis Description 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 Quantity: 1 Electronic Signature(s) Signed: 10/07/2022 9:47:19 AM By: Pearletha Alfred Signed: 10/08/2022 3:35:02 PM By: Baltazar Najjar MD Previous Signature: 09/13/2022 4:34:22 PM Version By: Baltazar Najjar MD Entered By:  Pearletha Alfred on 10/07/2022 09:47:18

## 2022-10-11 ENCOUNTER — Other Ambulatory Visit: Payer: Self-pay

## 2022-10-11 ENCOUNTER — Encounter (HOSPITAL_BASED_OUTPATIENT_CLINIC_OR_DEPARTMENT_OTHER): Payer: BC Managed Care – PPO | Attending: General Surgery | Admitting: General Surgery

## 2022-10-11 DIAGNOSIS — L97212 Non-pressure chronic ulcer of right calf with fat layer exposed: Secondary | ICD-10-CM | POA: Insufficient documentation

## 2022-10-11 DIAGNOSIS — R600 Localized edema: Secondary | ICD-10-CM | POA: Insufficient documentation

## 2022-10-11 DIAGNOSIS — I10 Essential (primary) hypertension: Secondary | ICD-10-CM | POA: Diagnosis not present

## 2022-10-11 DIAGNOSIS — G4733 Obstructive sleep apnea (adult) (pediatric): Secondary | ICD-10-CM

## 2022-10-11 DIAGNOSIS — L97222 Non-pressure chronic ulcer of left calf with fat layer exposed: Secondary | ICD-10-CM | POA: Insufficient documentation

## 2022-10-11 NOTE — Progress Notes (Addendum)
[name], [name] ([id number]) [id number] Page 1 of 14 Visit Report for 10/11/2022 Chief Complaint Document Details Patient Name: Date of Service: [name] 10/11/2022 1:45 PM Medical Record Number: [id number] Patient Account Number: [id number] Date of Birth/Sex: Treating RN: [birth year] (62 y.o. F) Primary Care Provider: Nira Conn Other Clinician: Referring Provider: Treating Provider/Extender: Andrey Campanile in Treatment: 37 Information Obtained from: Patient Chief Complaint RLE ulcer Electronic Signature(s) Signed: 10/11/2022 2:37:17 PM By: [name] MD FACS Previous Signature: 10/11/2022 2:11:44 PM Version By: [name] MD FACS Entered By: [name] on 10/11/2022 14:37:17 -------------------------------------------------------------------------------- Debridement Details Patient Name: Date of Service: [name], [name] 10/11/2022 1:45 PM Medical Record Number: [id number] Patient Account Number: [id number] Date of Birth/Sex: Treating RN: 1960-08-08 (62 y.o. F) Primary Care Provider: Nira Conn Other Clinician: Referring Provider: Treating Provider/Extender: Andrey Campanile in Treatment: 37 Debridement Performed for Assessment: Wound #2 Right,Posterior Lower Leg Performed By: Physician [name], MD Debridement Type: Debridement Severity of Tissue Pre Debridement: Fat layer exposed Level of Consciousness (Pre-procedure): Awake and Alert Pre-procedure Verification/Time Out Yes - 14:30 Taken: Start Time: 14:32 Pain Control: Lidocaine 4% T opical Solution Percent of Wound Bed Debrided: 100% T Area Debrided (cm): otal 0.94 Tissue and other material debrided: Non-Viable, Slough, Slough Level: Non-Viable Tissue Debridement Description: Selective/Open Wound Instrument: Curette Bleeding: Large Hemostasis Achieved: Pressure End Time: 14:40 Procedural Pain:  0 Post Procedural Pain: 0 Response to Treatment: Procedure was tolerated well Level of Consciousness (Post- Awake and Alert procedure): Post Debridement Measurements of Total Wound Length: (cm) 1.2 Width: (cm) 1 Depth: (cm) 0.1 Volume: (cm) 0.094 VINCENT, KUNG ([id number]) [id number].pdf Page 2 of 14 Character of Wound/Ulcer Post Debridement: Stable Severity of Tissue Post Debridement: Fat layer exposed Post Procedure Diagnosis Same as Pre-procedure Notes Scribed for Dr Lady Gary by Brenton Grills RN. Electronic Signature(s) Signed: 10/11/2022 2:36:43 PM By: [name] MD FACS Entered By: [name] on 10/11/2022 14:36:42 -------------------------------------------------------------------------------- Debridement Details Patient Name: Date of Service: [address] [name] 10/11/2022 1:45 PM Medical Record Number: [id number] Patient Account Number: [id number] Date of Birth/Sex: Treating RN: [birth year] (62 y.o. F) Primary Care Provider: Nira Conn Other Clinician: Referring Provider: Treating Provider/Extender: Andrey Campanile in Treatment: 37 Debridement Performed for Assessment: Wound #3 Left,Posterior Lower Leg Performed By: Physician [name], MD Debridement Type: Debridement Severity of Tissue Pre Debridement: Fat layer exposed Level of Consciousness (Pre-procedure): Awake and Alert Pre-procedure Verification/Time Out Yes - 14:30 Taken: Start Time: 14:32 Pain Control: Lidocaine 4% T opical Solution Percent of Wound Bed Debrided: 100% T Area Debrided (cm): otal 0.78 Tissue and other material debrided: Non-Viable, Slough, Slough Level: Non-Viable Tissue Debridement Description: Selective/Open Wound Instrument: Curette Bleeding: Minimum Hemostasis Achieved: Pressure End Time: 14:40 Procedural Pain: 0 Post Procedural Pain: 0 Response to Treatment: Procedure was tolerated well Level of  Consciousness (Post- Awake and Alert procedure): Post Debridement Measurements of Total Wound Length: (cm) 1 Width: (cm) 1 Depth: (cm) 0.1 Volume: (cm) 0.079 Character of Wound/Ulcer Post Debridement: Improved Severity of Tissue Post Debridement: Fat layer exposed Post Procedure Diagnosis Same as Pre-procedure Notes Scribed for Dr Lady Gary by Brenton Grills RN Electronic Signature(s) Signed: 10/11/2022 2:37:07 PM By: [name] MD FACS Entered By: [name] on 10/11/2022 14:37:07 [name] ([id number]) [id number] Page 3 of 14 -------------------------------------------------------------------------------- HPI Details Patient Name: Date of Service: [name] 10/11/2022 1:45 PM Medical Record Number: [id number] Patient Account Number: [id number] Date  of Birth/Sex: Treating RN: May 09, 1960 (62 y.o. F) Primary Care Provider: Nira Conn Other Clinician: Referring Provider: Treating Provider/Extender: Andrey Campanile in Treatment: 37 History of Present Illness HPI Description: 02/16/2020 upon evaluation today patient actually appears to be doing poorly in regard to her left medial lower extremity ulcer. This is actually an area that [name] tells me [name] has had intermittent issues with over the years although has been closed for some time [name] typically uses compression right now [name] has juxta lite compression wraps. With that being said [name] tells me that this nonetheless open several weeks/months ago and has been given her trouble since. [name] does have a history of chronic venous insufficiency [name] is seeing specialist for this in the past [name] has had an ablation as well as sclerotherapy. With that being said [name] also has hypertension chronically which is managed by her primary care provider. In general [name] seems to be worsening overall with regard to the wound and states that [name] finally realized that [name] needed  to come in and have somebody look at this and not continue to try to manage this on her own. No fevers, chills, nausea, vomiting, or diarrhea. 02/23/2020 on evaluation today patient appears to be doing well with regard to her wound. This is showing some signs of improvement which is great news still were not quite at the point where I would like to be as far as the overall appearance of the wound is concerned but I do believe this is better than last week. I do believe the Iodoflex is helping as well. 03/08/2020 upon evaluation today patient appears to be doing well with regard to her wound. [name] has been tolerating the dressing changes without complication. Fortunately I feel like [name] has made great progress with the Iodoflex but I feel like it may be the point rest to switch to something else possibly a collagen- based dressing at this time. 03/15/2020 upon evaluation today patient appears to be doing excellent in regard to her leg ulcer. [name] has been tolerating the dressing changes without complication. Fortunately there is no signs of active infection. Overall [name] is measuring a little bit smaller today which is great news. 03/22/2020 upon evaluation today patient appears to be doing well with regard to her wound. [name] has been tolerating the dressing changes without complication. Fortunately there is no signs of active infection at this time. 03/28/2020; patient I do not usually see however [name] has a wound on the left anterior lower leg secondary to chronic venous insufficiency we have been using silver collagen under compression. [name] arrives in clinic with a nonviable surface requiring debridement 04/12/2020 upon evaluation today patient appears to be doing well all things considered with regard to her leg ulcer. [name] is tolerating the dressing changes without complication there is minimal dry skin around the edges of the wound that may be trapping and stopping some of the events of the new skin I am  can work on that today. Otherwise the surface of the wound appears to be doing excellent. 04/19/2020 upon evaluation today patient appears to be doing well with regard to her leg ulcer. [name] has been tolerating dressing changes without complication. Fortunately there is no signs of active infection at this time. No fever chills noted overall very pleased with how things seem to be progressing. 04/26/2020 on evaluation today patient appears to be doing well with regard to her wound currently. Is showing signs of excellent improvement overall is filling  in nicely and there does not appear to be any signs of infection. No fevers, chills, nausea, vomiting, or diarrhea. 05/03/2020 upon evaluation today patient appears to be doing well with regard to her wound on the leg. This overall showing signs of good improvement which is great [name] has some good epithelial growth and overall I think that things are moving in the correct direction. We likewise going to continue with the wound care measures as before since [name] seems to making such good improvement. 2/3; venous wound on the left medial leg. This is contracting. We are using Prisma and 3 layer compression. [name] has a stocking and waiting in the eventuality this heals. [name] is already using it on the right 05/17/2020 upon evaluation today patient appears to be doing well with regard to her leg ulcer. [name] has been tolerating the dressing changes without complication. Fortunately there is no signs of active infection which is great news and overall very pleased with where things stand today. No fevers, chills, nausea, vomiting, or diarrhea. 05/24/2020 upon evaluation today patient appears to be doing well with regard to her wound. Overall I feel like [name] is making excellent progress. There does not appear to be any signs of active infection which is great news. 05/31/2020 upon evaluation today patient appears to be doing well with regard to her wound. There does not  appear to be any signs of active infection which is great news overall I am extremely pleased with where things stand today. READMISSION 01/23/2022 [name] returns to clinic today with a new wound on her right posterior calf. [name] says that [name] was cleaning out an old shed near the middle of August this year and then noticed what seemed to be a bug bite on her right posterior calf. It was itchy, red, and raised. By the end of September, an ulcer had developed. [name] has been applying various topical creams such as hydrocortisone and others to the site. When it was not improving, [name] made an appointment in the wound care center. ABI in clinic today was 0.97. On her right posterior calf, there is a circular wound with necrotic fat and black eschar present. There is no purulent drainage or malodor. The periwound skin is in good condition with just a little induration that appears to be secondary to inflammation. 01/31/2022: The wound measures a little bit larger today, but overall is quite a bit cleaner. There is some undermining from 9:00 to 3:00. [name] is having some periwound itching and says that her wrap slid. 02/08/2022: Despite using an Unna boot first layer at the top of her wrap, it slid again and it looks like there is been some bruising at the wound site. The wound is about the same size in terms of dimensions. There is a fair amount of slough and nonviable tissue still present. Edema control is better than last week. 02/15/2022: The wound dimensions are about the same. There is less nonviable tissue present. The periwound erythema has improved. 02/22/2022: The wound has deteriorated over the past week. It is larger and the periwound is more edematous, erythematous, and indurated. It looks as though there has been more tissue breakdown with undermining present. [name] is having more pain. There is a foul odor coming from the wound. 02/26/2022: The wound looks quite a bit better today and the odor is gone.  I still do not have her culture data back, but [name] seems to be responding well to the TROI, RIRIE ([id number]) [id number].pdf Page  4 of 14 Augmentin. 03/06/2022: Her wound continues to improve. There is still some slough accumulation, but the periwound skin is less inflamed. Her culture returned with a polymicrobial population including Pseudomonas and so levofloxacin was also prescribed. [name] did not understand why a second antibiotic was being added so [name] has not yet initiated this. 03/14/2022: The wound is about the same, to perhaps a little bit larger. There is a fair amount of slough accumulation on the surface, as well as some hypertrophic granulation tissue. [name] is still taking levofloxacin, but has completed taking Augmentin. [name] has her Jodie Echevaria compound with her today. 12/14; the patient has a significant circular wound on the posterior right calf. [name] is using Keystone and silver alginate under 3 layer compression. Her ABIs were within normal limits at 0.97. This may have been traumatic or an insect bite at the start I reviewed these records. 04/04/2022: Since I last saw the wound, it has contracted considerably. There is a fairly thick layer of slough on the surface, as it has not been debrided since the last time I did it. Edema control is excellent and the periwound skin is in much better condition. 04/12/2022: No significant change in the wound dimensions. It is filling with granulation tissue. Still with slough accumulation on the surface. 04/19/2022: The wound is smaller this week. There is some slough accumulation on the surface. 04/26/2022: The wound is smaller again this week and significantly cleaner. The wound surface is a little bit drier than ideal. 05/03/2022: The wound measurements were about the same, but visually it appears smaller. There is still some undermining at the top of the wound. Moisture balance is better this week. 05/10/2022: The wound  measured smaller today. There is still a fair amount of undermining present. Slough has built up on the surface. We are still awaiting snap VAC approval. 05/17/2022: The wound is a little bit smaller today. There is less slough on the surface, but the granulation tissue still is not very robust. [name] has been approved for snap VAC but will have a 20% coinsurance and it is not clear what that amount would end up being for her. 05/27/2022: The surface of the wound has deteriorated and it is gray and fibrotic. No significant odor, but the drainage on her dressing was a little bit purulent. 05/31/2022: The wound looks quite a bit better today. It is still a little fibrotic but no longer has purulent-looking drainage. The color is better. [name] spoke with her insurance company and is interested in trying the snap VAC, now that [name] is aware of the cost to her. 06/07/2022: After 1 week in the snap VAC, there has been substantial improvement to her wound. The undermined portion is closing in. The surface has a healthier color and appearance. There is very minimal slough accumulation. 06/14/2022: The more shallow part of the undermined portion of her wound has closed. There is still some undermining from about 1-2 o'clock. The surface continues to improve. Minimal slough accumulation. 06/28/2022: The wound is shallower this week and the undermining has essentially closed and completely. The surface tissue is improving. 07/05/2022: The depth is almost immeasurable and the surface is nearly flush with the surrounding skin. The undermining has been eliminated. There is light slough on the wound surface. 07/12/2022: There is a little bit of slough on the wound surface. The depth is about the same as last week. 07/19/2022: The wound is essentially flush with the surrounding skin surface. There is slight slough present.  No real change in the AP or transverse dimensions. 07/26/2022: The wound measured a little bit smaller today. It  is flush with the surrounding skin surface and there is good granulation tissue present. Minimal slough and biofilm accumulation. 08/02/2022: The right posterior leg wound is a little bit smaller. There is a little slough accumulation. [name] reported a new wound to [place] today. It is on her left posterior calf. It has been present for about 6 weeks. [name] is not sure of how it occurred. [name] has been trying to manage it at home with topical Neosporin and Band-Aids. The fat layer is exposed. The surface is fibrotic and covered with slough. 08/09/2022: The right posterior leg wound is smaller again this week. There is good granulation tissue on the surface with minimal slough accumulation. The left posterior calf wound has built up a thick layer of slough. It is still quite fibrotic underneath the slough, but there is a little bit of a pink color beginning to emerge. Edema control is good. 08/16/2022: Both wounds are smaller this week. There is minimal slough on the right posterior leg wound. There is slough that has reaccumulated on the left posterior calf wound but there is a little bit more granulation tissue emerging. 08/23/2022: Both wounds are about the same size this week, but the quality of the tissue and amount of granulation tissue on the left are both better. 08/30/2022: Both wounds measure smaller today, but there has been some tissue breakdown adjacent to the left leg wound. [name] says it has been itchy. There is a little bit of slough on the surface of both wounds. 09/06/2022: The right wound measures smaller today. There is minimal slough on the wound surface. The left wound is about the same size. The periwound tissue looks better this week. There is still a layer of fibrinous slough on the left wound. Edema control is good bilaterally. 6/7 the patient has wounds on bilateral calfs. We are using silver alginate on the right Iodoflex on the left under Urgo lite compression. The wounds are measuring  smaller. 09/20/2022: The right wound is flush with the surrounding skin. There is good granulation tissue with some biofilm and thin eschar present. On the left, there is some slough accumulation and the patient says it has been a little itchy this week. 09/27/2022: No significant change to the right wound. The left wound measures a little smaller, but still has some depth to it with slough accumulation. 10/04/2022: Nice improvement in both wounds. They are smaller and more superficial. The left wound has better granulation tissue and no longer has the hard fibrous surface. 10/11/2022: Both wounds continue to contract. There is minimal slough and eschar present. Granulation tissue continues to fill in on the left. Edema control is good. Electronic Signature(s) Signed: 10/11/2022 2:38:01 PM By: [name] MD FACS Entered By: [name] on 10/11/2022 14:38:01 [name] ([id number]) [id number] Page 5 of 14 -------------------------------------------------------------------------------- Physical Exam Details Patient Name: Date of Service: [name] 10/11/2022 1:45 PM Medical Record Number: [id number] Patient Account Number: [id number] Date of Birth/Sex: Treating RN: October 21, 1960 (62 y.o. F) Primary Care Provider: Nira Conn Other Clinician: Referring Provider: Treating Provider/Extender: Andrey Campanile in Treatment: 37 Constitutional Hypertensive, asymptomatic. . . . no acute distress. Respiratory Normal work of breathing on room air. Notes 10/11/2022: Both wounds continue to contract. There is minimal slough and eschar present. Granulation tissue continues to fill in on the left. Edema control is good.  Electronic Signature(s) Signed: 10/11/2022 2:38:38 PM By: [name] MD FACS Entered By: [name] on 10/11/2022  14:38:38 -------------------------------------------------------------------------------- Physician Orders Details Patient Name: Date of Service: [address], [name] 10/11/2022 1:45 PM Medical Record Number: [id number] Patient Account Number: [id number] Date of Birth/Sex: Treating RN: 03/12/1961 (62 y.o. Gevena Mart Primary Care Provider: Nira Conn Other Clinician: Referring Provider: Treating Provider/Extender: Andrey Campanile in Treatment: 37 Verbal / Phone Orders: No Diagnosis Coding ICD-10 Coding Code Description [phone] Non-pressure chronic ulcer of right calf with fat layer exposed L97.222 Non-pressure chronic ulcer of left calf with fat layer exposed E66.01 Morbid (severe) obesity due to excess calories I10 Essential (primary) hypertension R60.0 Localized edema Follow-up Appointments ppointment in 1 week. - Dr. Lady Gary RM 3 Return A Anesthetic (In clinic) Topical Lidocaine 4% applied to wound bed Bathing/ Shower/ Hygiene May shower with protection but do not get wound dressing(s) wet. Protect dressing(s) with water repellant cover (for example, large plastic bag) or a cast cover and may then take shower. Edema Control - Lymphedema / SCD / Other Bilateral Lower Extremities Elevate legs to the level of the heart or above for 30 minutes daily and/or when sitting for 3-4 times a day throughout the day. Avoid standing for long periods of time. Exercise regularly SENDI, MESHELL ([id number]) [id number] Page 6 of 14 Moisturize legs daily. Wound Treatment Wound #2 - Lower Leg Wound Laterality: Right, Posterior Cleanser: Soap and Water 1 x Per Week/30 Days Discharge Instructions: May shower and wash wound with dial antibacterial soap and water prior to dressing change. Cleanser: Wound Cleanser 1 x Per Week/30 Days Discharge Instructions: Cleanse the wound with wound cleanser prior to applying a clean dressing using  gauze sponges, not tissue or cotton balls. Peri-Wound Care: Sween Lotion (Moisturizing lotion) 1 x Per Week/30 Days Discharge Instructions: Apply moisturizing lotion as directed Topical: Gentamicin 1 x Per Week/30 Days Discharge Instructions: As directed by physician Topical: Mupirocin Ointment 1 x Per Week/30 Days Discharge Instructions: Apply Mupirocin (Bactroban) as instructed Prim Dressing: Promogran Prisma Matrix, 4.34 (sq in) (silver collagen) 1 x Per Week/30 Days ary Discharge Instructions: Moisten collagen with saline or hydrogel Secondary Dressing: Woven Gauze Sponge, Non-Sterile 4x4 in 1 x Per Week/30 Days Discharge Instructions: Apply over primary dressing as directed. Compression Wrap: Urgo K2 Lite, (equivalent to a 3 layer) two layer compression system, regular 1 x Per Week/30 Days Discharge Instructions: Apply Urgo K2 Lite as directed (alternative to 3 layer compression). Wound #3 - Lower Leg Wound Laterality: Left, Posterior Cleanser: Soap and Water 1 x Per Week/30 Days Discharge Instructions: May shower and wash wound with dial antibacterial soap and water prior to dressing change. Cleanser: Wound Cleanser 1 x Per Week/30 Days Discharge Instructions: Cleanse the wound with wound cleanser prior to applying a clean dressing using gauze sponges, not tissue or cotton balls. Peri-Wound Care: Sween Lotion (Moisturizing lotion) 1 x Per Week/30 Days Discharge Instructions: Apply moisturizing lotion as directed Topical: Gentamicin 1 x Per Week/30 Days Discharge Instructions: As directed by physician Topical: Mupirocin Ointment 1 x Per Week/30 Days Discharge Instructions: Apply Mupirocin (Bactroban) as instructed Prim Dressing: Promogran Prisma Matrix, 4.34 (sq in) (silver collagen) 1 x Per Week/30 Days ary Discharge Instructions: Moisten collagen with saline or hydrogel Secondary Dressing: Woven Gauze Sponge, Non-Sterile 4x4 in 1 x Per Week/30 Days Discharge Instructions: Apply  over primary dressing as directed. Compression Wrap: Urgo K2 Lite, (equivalent to a 3 layer) two layer compression system, regular  1 x Per Week/30 Days Discharge Instructions: Apply Urgo K2 Lite as directed (alternative to 3 layer compression). Electronic Signature(s) Signed: 10/11/2022 2:44:46 PM By: [name] MD FACS Entered By: [name] on 10/11/2022 14:39:12 -------------------------------------------------------------------------------- Problem List Details Patient Name: Date of Service: [address], [name] 10/11/2022 1:45 PM Medical Record Number: [id number] Patient Account Number: [id number] Date of Birth/Sex: Treating RN: 1960/12/08 (62 y.o. F) Primary Care Provider: Nira Conn Other Clinician: Referring Provider: Treating Provider/Extender: Andrey Campanile in Treatment: [address], [place] ([id number]) [id number] Page 7 of 14 Active Problems ICD-10 Encounter Code Description Active Date MDM Diagnosis L97.212 Non-pressure chronic ulcer of right calf with fat layer exposed 01/23/2022 No Yes L97.222 Non-pressure chronic ulcer of left calf with fat layer exposed 08/02/2022 No Yes E66.01 Morbid (severe) obesity due to excess calories 01/23/2022 No Yes I10 Essential (primary) hypertension 01/23/2022 No Yes R60.0 Localized edema 01/23/2022 No Yes Inactive Problems Resolved Problems Electronic Signature(s) Signed: 10/11/2022 2:36:24 PM By: [name] MD FACS Previous Signature: 10/11/2022 2:11:11 PM Version By: [name] MD FACS Entered By: [name] on 10/11/2022 14:36:24 -------------------------------------------------------------------------------- Progress Note Details Patient Name: Date of Service: [name], [name] 10/11/2022 1:45 PM Medical Record Number: [id number] Patient Account Number: [id number] Date of Birth/Sex: Treating RN: December 18, 1960 (62 y.o. F) Primary Care Provider:  Nira Conn Other Clinician: Referring Provider: Treating Provider/Extender: Andrey Campanile in Treatment: 37 Subjective Chief Complaint Information obtained from Patient RLE ulcer History of Present Illness (HPI) 02/16/2020 upon evaluation today patient actually appears to be doing poorly in regard to her left medial lower extremity ulcer. This is actually an area that [name] tells me [name] has had intermittent issues with over the years although has been closed for some time [name] typically uses compression right now [name] has juxta lite compression wraps. With that being said [name] tells me that this nonetheless open several weeks/months ago and has been given her trouble since. [name] does have a history of chronic venous insufficiency [name] is seeing specialist for this in the past [name] has had an ablation as well as sclerotherapy. With that being said [name] also has hypertension chronically which is managed by her primary care provider. In general [name] seems to be worsening overall with regard to the wound and states that [name] finally realized that [name] needed to come in and have somebody look at this and not continue to try to manage this on her own. No fevers, chills, nausea, vomiting, or diarrhea. 02/23/2020 on evaluation today patient appears to be doing well with regard to her wound. This is showing some signs of improvement which is great news still were not quite at the point where I would like to be as far as the overall appearance of the wound is concerned but I do believe this is better than last week. I do believe the Iodoflex is helping as well. 03/08/2020 upon evaluation today patient appears to be doing well with regard to her wound. [name] has been tolerating the dressing changes without complication. Fortunately I feel like [name] has made great progress with the Iodoflex but I feel like it may be the point rest to switch to something else possibly a collagen- based  dressing at this time. 03/15/2020 upon evaluation today patient appears to be doing excellent in regard to her leg ulcer. [name] has been tolerating the dressing changes without complication. Fortunately there is no signs of active infection. Overall [name] is measuring a little  bit smaller today which is great news. MYTHILI, KNEISEL ([id number]) [id number] Page 8 of 14 03/22/2020 upon evaluation today patient appears to be doing well with regard to her wound. [name] has been tolerating the dressing changes without complication. Fortunately there is no signs of active infection at this time. 03/28/2020; patient I do not usually see however [name] has a wound on the left anterior lower leg secondary to chronic venous insufficiency we have been using silver collagen under compression. [name] arrives in clinic with a nonviable surface requiring debridement 04/12/2020 upon evaluation today patient appears to be doing well all things considered with regard to her leg ulcer. [name] is tolerating the dressing changes without complication there is minimal dry skin around the edges of the wound that may be trapping and stopping some of the events of the new skin I am can work on that today. Otherwise the surface of the wound appears to be doing excellent. 04/19/2020 upon evaluation today patient appears to be doing well with regard to her leg ulcer. [name] has been tolerating dressing changes without complication. Fortunately there is no signs of active infection at this time. No fever chills noted overall very pleased with how things seem to be progressing. 04/26/2020 on evaluation today patient appears to be doing well with regard to her wound currently. Is showing signs of excellent improvement overall is filling in nicely and there does not appear to be any signs of infection. No fevers, chills, nausea, vomiting, or diarrhea. 05/03/2020 upon evaluation today patient appears to be doing well with regard  to her wound on the leg. This overall showing signs of good improvement which is great [name] has some good epithelial growth and overall I think that things are moving in the correct direction. We likewise going to continue with the wound care measures as before since [name] seems to making such good improvement. 2/3; venous wound on the left medial leg. This is contracting. We are using Prisma and 3 layer compression. [name] has a stocking and waiting in the eventuality this heals. [name] is already using it on the right 05/17/2020 upon evaluation today patient appears to be doing well with regard to her leg ulcer. [name] has been tolerating the dressing changes without complication. Fortunately there is no signs of active infection which is great news and overall very pleased with where things stand today. No fevers, chills, nausea, vomiting, or diarrhea. 05/24/2020 upon evaluation today patient appears to be doing well with regard to her wound. Overall I feel like [name] is making excellent progress. There does not appear to be any signs of active infection which is great news. 05/31/2020 upon evaluation today patient appears to be doing well with regard to her wound. There does not appear to be any signs of active infection which is great news overall I am extremely pleased with where things stand today. READMISSION 01/23/2022 [name] returns to clinic today with a new wound on her right posterior calf. [name] says that [name] was cleaning out an old shed near the middle of August this year and then noticed what seemed to be a bug bite on her right posterior calf. It was itchy, red, and raised. By the end of September, an ulcer had developed. [name] has been applying various topical creams such as hydrocortisone and others to the site. When it was not improving, [name] made an appointment in the wound care center. ABI in clinic today was 0.97. On her right posterior calf, there is a circular  wound with necrotic fat and black eschar  present. There is no purulent drainage or malodor. The periwound skin is in good condition with just a little induration that appears to be secondary to inflammation. 01/31/2022: The wound measures a little bit larger today, but overall is quite a bit cleaner. There is some undermining from 9:00 to 3:00. [name] is having some periwound itching and says that her wrap slid. 02/08/2022: Despite using an Unna boot first layer at the top of her wrap, it slid again and it looks like there is been some bruising at the wound site. The wound is about the same size in terms of dimensions. There is a fair amount of slough and nonviable tissue still present. Edema control is better than last week. 02/15/2022: The wound dimensions are about the same. There is less nonviable tissue present. The periwound erythema has improved. 02/22/2022: The wound has deteriorated over the past week. It is larger and the periwound is more edematous, erythematous, and indurated. It looks as though there has been more tissue breakdown with undermining present. [name] is having more pain. There is a foul odor coming from the wound. 02/26/2022: The wound looks quite a bit better today and the odor is gone. I still do not have her culture data back, but [name] seems to be responding well to the Augmentin. 03/06/2022: Her wound continues to improve. There is still some slough accumulation, but the periwound skin is less inflamed. Her culture returned with a polymicrobial population including Pseudomonas and so levofloxacin was also prescribed. [name] did not understand why a second antibiotic was being added so [name] has not yet initiated this. 03/14/2022: The wound is about the same, to perhaps a little bit larger. There is a fair amount of slough accumulation on the surface, as well as some hypertrophic granulation tissue. [name] is still taking levofloxacin, but has completed taking Augmentin. [name] has her Jodie Echevaria compound with her today. 12/14; the  patient has a significant circular wound on the posterior right calf. [name] is using Keystone and silver alginate under 3 layer compression. Her ABIs were within normal limits at 0.97. This may have been traumatic or an insect bite at the start I reviewed these records. 04/04/2022: Since I last saw the wound, it has contracted considerably. There is a fairly thick layer of slough on the surface, as it has not been debrided since the last time I did it. Edema control is excellent and the periwound skin is in much better condition. 04/12/2022: No significant change in the wound dimensions. It is filling with granulation tissue. Still with slough accumulation on the surface. 04/19/2022: The wound is smaller this week. There is some slough accumulation on the surface. 04/26/2022: The wound is smaller again this week and significantly cleaner. The wound surface is a little bit drier than ideal. 05/03/2022: The wound measurements were about the same, but visually it appears smaller. There is still some undermining at the top of the wound. Moisture balance is better this week. 05/10/2022: The wound measured smaller today. There is still a fair amount of undermining present. Slough has built up on the surface. We are still awaiting snap VAC approval. 05/17/2022: The wound is a little bit smaller today. There is less slough on the surface, but the granulation tissue still is not very robust. [name] has been approved for snap VAC but will have a 20% coinsurance and it is not clear what that amount would end up being for her. 05/27/2022: The surface  of the wound has deteriorated and it is gray and fibrotic. No significant odor, but the drainage on her dressing was a little bit purulent. 05/31/2022: The wound looks quite a bit better today. It is still a little fibrotic but no longer has purulent-looking drainage. The color is better. [name] spoke with her insurance company and is interested in trying the snap VAC, now that [name] is  aware of the cost to her. 06/07/2022: After 1 week in the snap VAC, there has been substantial improvement to her wound. The undermined portion is closing in. The surface has a healthier color and appearance. There is very minimal slough accumulation. 06/14/2022: The more shallow part of the undermined portion of her wound has closed. There is still some undermining from about 1-2 o'clock. The surface continues to improve. Minimal slough accumulation. BONETA, ZURFLUH ([id number]) [id number] Page 9 of 14 06/28/2022: The wound is shallower this week and the undermining has essentially closed and completely. The surface tissue is improving. 07/05/2022: The depth is almost immeasurable and the surface is nearly flush with the surrounding skin. The undermining has been eliminated. There is light slough on the wound surface. 07/12/2022: There is a little bit of slough on the wound surface. The depth is about the same as last week. 07/19/2022: The wound is essentially flush with the surrounding skin surface. There is slight slough present. No real change in the AP or transverse dimensions. 07/26/2022: The wound measured a little bit smaller today. It is flush with the surrounding skin surface and there is good granulation tissue present. Minimal slough and biofilm accumulation. 08/02/2022: The right posterior leg wound is a little bit smaller. There is a little slough accumulation. [name] reported a new wound to [place] today. It is on her left posterior calf. It has been present for about 6 weeks. [name] is not sure of how it occurred. [name] has been trying to manage it at home with topical Neosporin and Band-Aids. The fat layer is exposed. The surface is fibrotic and covered with slough. 08/09/2022: The right posterior leg wound is smaller again this week. There is good granulation tissue on the surface with minimal slough accumulation. The left posterior calf wound has built up a thick layer of  slough. It is still quite fibrotic underneath the slough, but there is a little bit of a pink color beginning to emerge. Edema control is good. 08/16/2022: Both wounds are smaller this week. There is minimal slough on the right posterior leg wound. There is slough that has reaccumulated on the left posterior calf wound but there is a little bit more granulation tissue emerging. 08/23/2022: Both wounds are about the same size this week, but the quality of the tissue and amount of granulation tissue on the left are both better. 08/30/2022: Both wounds measure smaller today, but there has been some tissue breakdown adjacent to the left leg wound. [name] says it has been itchy. There is a little bit of slough on the surface of both wounds. 09/06/2022: The right wound measures smaller today. There is minimal slough on the wound surface. The left wound is about the same size. The periwound tissue looks better this week. There is still a layer of fibrinous slough on the left wound. Edema control is good bilaterally. 6/7 the patient has wounds on bilateral calfs. We are using silver alginate on the right Iodoflex on the left under Urgo lite compression. The wounds are measuring smaller. 09/20/2022: The right wound is flush with the  surrounding skin. There is good granulation tissue with some biofilm and thin eschar present. On the left, there is some slough accumulation and the patient says it has been a little itchy this week. 09/27/2022: No significant change to the right wound. The left wound measures a little smaller, but still has some depth to it with slough accumulation. 10/04/2022: Nice improvement in both wounds. They are smaller and more superficial. The left wound has better granulation tissue and no longer has the hard fibrous surface. 10/11/2022: Both wounds continue to contract. There is minimal slough and eschar present. Granulation tissue continues to fill in on the left. Edema control is good. Patient  History Information obtained from Patient. Family History Cancer - Mother, Diabetes - Father, Hypertension - Mother, Kidney Disease - Mother, Lung Disease - Father, Thyroid Problems - Paternal Grandparents, No family history of Heart Disease, Seizures, Stroke, Tuberculosis. Social History Never smoker, Marital Status - Married, Alcohol Use - Never, Drug Use - No History, Caffeine Use - Daily - coffee. Medical History Eyes Denies history of Cataracts, Glaucoma, Optic Neuritis Ear/Nose/Mouth/Throat Denies history of Chronic sinus problems/congestion, Middle ear problems Hematologic/Lymphatic Patient has history of Anemia - iron Denies history of Hemophilia, Human Immunodeficiency Virus, Lymphedema, Sickle Cell Disease Respiratory Patient has history of Sleep Apnea - CPAP Denies history of Aspiration, Asthma, Chronic Obstructive Pulmonary Disease (COPD), Pneumothorax, Tuberculosis Cardiovascular Patient has history of Hypertension, Peripheral Venous Disease Denies history of Angina, Arrhythmia, Congestive Heart Failure, Coronary Artery Disease, Deep Vein Thrombosis, Hypotension, Myocardial Infarction, Peripheral Arterial Disease, Phlebitis, Vasculitis Gastrointestinal Denies history of Cirrhosis , Colitis, Crohns, Hepatitis A, Hepatitis B, Hepatitis C Endocrine Denies history of Type I Diabetes, Type II Diabetes Genitourinary Denies history of End Stage Renal Disease Immunological Denies history of Lupus Erythematosus, Raynauds, Scleroderma Integumentary (Skin) Denies history of History of Burn Musculoskeletal Denies history of Gout, Rheumatoid Arthritis, Osteoarthritis, Osteomyelitis Neurologic Denies history of Dementia, Neuropathy, Quadriplegia, Paraplegia, Seizure Disorder Oncologic Denies history of Received Chemotherapy, Received Radiation Psychiatric Denies history of Anorexia/bulimia, Confinement Anxiety Hospitalization/Surgery History - cholecystectomy 1980s. -  nephrolithasis 1980s. - plate and rod in right elbow surgery 2010. DEMITRA, TEEGARDIN ([id number]) [id number] Page 10 of 14 Medical A Surgical History Notes nd Genitourinary years ago kidney stones Oncologic skin Ca removed from back years ago Objective Constitutional Hypertensive, asymptomatic. no acute distress. Vitals Time Taken: 1:51 PM, Height: 61 in, Weight: 277 lbs, BMI: 52.3, Temperature: 98.2 F, Pulse: 90 bpm, Respiratory Rate: 20 breaths/min, Blood Pressure: 155/88 mmHg. Respiratory Normal work of breathing on room air. General Notes: 10/11/2022: Both wounds continue to contract. There is minimal slough and eschar present. Granulation tissue continues to fill in on the left. Edema control is good. Integumentary (Hair, Skin) Wound #2 status is Open. Original cause of wound was Insect Bite. The date acquired was: 11/21/2021. The wound has been in treatment 37 weeks. The wound is located on the Right,Posterior Lower Leg. The wound measures 1.2cm length x 1cm width x 0.1cm depth; 0.942cm^2 area and 0.094cm^3 volume. There is Fat Layer (Subcutaneous Tissue) exposed. There is no tunneling or undermining noted. There is a medium amount of serosanguineous drainage noted. The wound margin is flat and intact. There is medium (34-66%) red granulation within the wound bed. There is a medium (34-66%) amount of necrotic tissue within the wound bed including Adherent Slough. The periwound skin appearance had no abnormalities noted for texture. The periwound skin appearance had no abnormalities noted for moisture. The periwound skin appearance exhibited: Hemosiderin Staining.  The periwound skin appearance did not exhibit: Atrophie Blanche, Ecchymosis, Rubor, Erythema. Periwound temperature was noted as No Abnormality. The periwound has tenderness on palpation. Wound #3 status is Open. Original cause of wound was Trauma. The date acquired was: 06/28/2022. The wound has been  in treatment 10 weeks. The wound is located on the Left,Posterior Lower Leg. The wound measures 1cm length x 1cm width x 0.1cm depth; 0.785cm^2 area and 0.079cm^3 volume. There is Fat Layer (Subcutaneous Tissue) exposed. There is no tunneling or undermining noted. There is a medium amount of serosanguineous drainage noted. The wound margin is flat and intact. There is medium (34-66%) red granulation within the wound bed. There is a medium (34-66%) amount of necrotic tissue within the wound bed including Adherent Slough. The periwound skin appearance had no abnormalities noted for texture. The periwound skin appearance had no abnormalities noted for moisture. The periwound skin appearance had no abnormalities noted for color. Periwound temperature was noted as No Abnormality. The periwound has tenderness on palpation. 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 There was a Selective/Open Wound Non-Viable Tissue Debridement with a total area of 0.94 sq cm performed by [name], MD. With the following instrument(s): Curette to remove Non-Viable tissue/material. Material removed includes [facility] after achieving pain control using Lidocaine 4% Topical Solution. No specimens were taken. A time out was conducted at 14:30, prior to the start of the procedure. A Large amount of bleeding was controlled with Pressure. The procedure was tolerated well with a pain level of 0 throughout and a pain level of 0 following the procedure. Post Debridement Measurements: 1.2cm length x 1cm width x 0.1cm depth; 0.094cm^3  volume. Character of Wound/Ulcer Post Debridement is stable. Severity of Tissue Post Debridement is: Fat layer exposed. Post procedure Diagnosis Wound #2: Same as Pre-Procedure General Notes: Scribed for Dr Lady Gary by Brenton Grills RN.. Pre-procedure diagnosis of Wound #2 is a Venous Leg Ulcer located on the Right,Posterior Lower Leg . There was a Three Layer Compression Therapy Procedure by Brenton Grills, RN. Post procedure Diagnosis Wound #2: Same as Pre-Procedure Wound #3 Pre-procedure diagnosis of Wound #3 is a Venous Leg Ulcer located on the Left,Posterior Lower Leg .Severity of Tissue Pre Debridement is: Fat layer XANA, NOWLEN ([id number]) [id number] Page 11 of 14 exposed. There was a Selective/Open Wound Non-Viable Tissue Debridement with a total area of 0.78 sq cm performed by [name], MD. With the following instrument(s): Curette to remove Non-Viable tissue/material. Material removed includes [facility] after achieving pain control using Lidocaine 4% Topical Solution. No specimens were taken. A time out was conducted at 14:30, prior to the start of the procedure. A Minimum amount of bleeding was controlled with Pressure. The procedure was tolerated well with a pain level of 0 throughout and a pain level of 0 following the procedure. Post Debridement Measurements: 1cm length x 1cm width x 0.1cm depth; 0.079cm^3 volume. Character of Wound/Ulcer Post Debridement is improved. Severity of Tissue Post Debridement is: Fat layer exposed. Post procedure Diagnosis Wound #3: Same as Pre-Procedure General Notes: Scribed for Dr Lady Gary by Brenton Grills RN. Pre-procedure diagnosis of Wound #3 is a Venous Leg Ulcer located on  the Left,Posterior Lower Leg . There was a Three Layer Compression Therapy Procedure by Brenton Grills, RN. Post procedure Diagnosis Wound #3: Same as Pre-Procedure Plan Follow-up Appointments: Return Appointment in 1 week. - Dr. Lady Gary RM  3 Anesthetic: (In clinic) Topical Lidocaine 4% applied to wound bed Bathing/ Shower/ Hygiene: May shower with protection but do not get wound dressing(s) wet. Protect dressing(s) with water repellant cover (for example, large plastic bag) or a cast cover and may then take shower. Edema Control - Lymphedema / SCD / Other: Elevate legs to the level of the heart or above for 30 minutes daily and/or when sitting for 3-4 times a day throughout the day. Avoid standing for long periods of time. Exercise regularly Moisturize legs daily. WOUND #2: - Lower Leg Wound Laterality: Right, Posterior Cleanser: Soap and Water 1 x Per Week/30 Days Discharge Instructions: May shower and wash wound with dial antibacterial soap and water prior to dressing change. Cleanser: Wound Cleanser 1 x Per Week/30 Days Discharge Instructions: Cleanse the wound with wound cleanser prior to applying a clean dressing using gauze sponges, not tissue or cotton balls. Peri-Wound Care: Sween Lotion (Moisturizing lotion) 1 x Per Week/30 Days Discharge Instructions: Apply moisturizing lotion as directed Topical: Gentamicin 1 x Per Week/30 Days Discharge Instructions: As directed by physician Topical: Mupirocin Ointment 1 x Per Week/30 Days Discharge Instructions: Apply Mupirocin (Bactroban) as instructed Prim Dressing: Promogran Prisma Matrix, 4.34 (sq in) (silver collagen) 1 x Per Week/30 Days ary Discharge Instructions: Moisten collagen with saline or hydrogel Secondary Dressing: Woven Gauze Sponge, Non-Sterile 4x4 in 1 x Per Week/30 Days Discharge Instructions: Apply over primary dressing as directed. Com pression Wrap: Urgo K2 Lite, (equivalent to a 3 layer) two layer compression system, regular 1 x Per Week/30 Days Discharge Instructions: Apply Urgo K2 Lite as directed (alternative to 3 layer compression). WOUND #3: - Lower Leg Wound Laterality: Left, Posterior Cleanser: Soap and Water 1 x Per Week/30 Days Discharge  Instructions: May shower and wash wound with dial antibacterial soap and water prior to dressing change. Cleanser: Wound Cleanser 1 x Per Week/30 Days Discharge Instructions: Cleanse the wound with wound cleanser prior to applying a clean dressing using gauze sponges, not tissue or cotton balls. Peri-Wound Care: Sween Lotion (Moisturizing lotion) 1 x Per Week/30 Days Discharge Instructions: Apply moisturizing lotion as directed Topical: Gentamicin 1 x Per Week/30 Days Discharge Instructions: As directed by physician Topical: Mupirocin Ointment 1 x Per Week/30 Days Discharge Instructions: Apply Mupirocin (Bactroban) as instructed Prim Dressing: Promogran Prisma Matrix, 4.34 (sq in) (silver collagen) 1 x Per Week/30 Days ary Discharge Instructions: Moisten collagen with saline or hydrogel Secondary Dressing: Woven Gauze Sponge, Non-Sterile 4x4 in 1 x Per Week/30 Days Discharge Instructions: Apply over primary dressing as directed. Com pression Wrap: Urgo K2 Lite, (equivalent to a 3 layer) two layer compression system, regular 1 x Per Week/30 Days Discharge Instructions: Apply Urgo K2 Lite as directed (alternative to 3 layer compression). 10/11/2022: Both wounds continue to contract. There is minimal slough and eschar present. Granulation tissue continues to fill in on the left. Edema control is good. I used a curette to debride slough and eschar from the right wound and slough from the left wound. We will continue the mixture of topical gentamicin and mupirocin to both wounds along with Prisma silver collagen and bilateral 3 layer Urgo lite wraps. Follow-up in 1 week. Electronic Signature(s) Signed: 10/11/2022 2:39:53 PM By: [name] MD FACS Entered By: [name] on 10/11/2022  14:39:53 ANQUETTE, WICHERT ([id number]) [id number] Page 12 of 14 -------------------------------------------------------------------------------- HxROS Details Patient Name: Date  of Service: [name] 10/11/2022 1:45 PM Medical Record Number: [id number] Patient Account Number: [id number] Date of Birth/Sex: Treating RN: April 06, 1961 (62 y.o. F) Primary Care Provider: Nira Conn Other Clinician: Referring Provider: Treating Provider/Extender: Andrey Campanile in Treatment: 37 Information Obtained From Patient Eyes Medical History: Negative for: Cataracts; Glaucoma; Optic Neuritis Ear/Nose/Mouth/Throat Medical History: Negative for: Chronic sinus problems/congestion; Middle ear problems Hematologic/Lymphatic Medical History: Positive for: Anemia - iron Negative for: Hemophilia; Human Immunodeficiency Virus; Lymphedema; Sickle Cell Disease Respiratory Medical History: Positive for: Sleep Apnea - CPAP Negative for: Aspiration; Asthma; Chronic Obstructive Pulmonary Disease (COPD); Pneumothorax; Tuberculosis Cardiovascular Medical History: Positive for: Hypertension; Peripheral Venous Disease Negative for: Angina; Arrhythmia; Congestive Heart Failure; Coronary Artery Disease; Deep Vein Thrombosis; Hypotension; Myocardial Infarction; Peripheral Arterial Disease; Phlebitis; Vasculitis Gastrointestinal Medical History: Negative for: Cirrhosis ; Colitis; Crohns; Hepatitis A; Hepatitis B; Hepatitis C Endocrine Medical History: Negative for: Type I Diabetes; Type II Diabetes Genitourinary Medical History: Negative for: End Stage Renal Disease Past Medical History Notes: years ago kidney stones Immunological Medical History: Negative for: Lupus Erythematosus; Raynauds; Scleroderma Integumentary (Skin) Medical History: Negative for: History of Burn Musculoskeletal Medical History: Negative for: Gout; Rheumatoid Arthritis; Osteoarthritis; Osteomyelitis GENESA, ARGENTO ([id number]) [id number] Page 13 of 14 Neurologic Medical History: Negative for: Dementia; Neuropathy; Quadriplegia;  Paraplegia; Seizure Disorder Oncologic Medical History: Negative for: Received Chemotherapy; Received Radiation Past Medical History Notes: skin Ca removed from back years ago Psychiatric Medical History: Negative for: Anorexia/bulimia; Confinement Anxiety Immunizations Pneumococcal Vaccine: Received Pneumococcal Vaccination: No Implantable Devices None Hospitalization / Surgery History Type of Hospitalization/Surgery cholecystectomy 1980s nephrolithasis 1980s plate and rod in right elbow surgery 2010 Family and Social History Cancer: Yes - Mother; Diabetes: Yes - Father; Heart Disease: No; Hypertension: Yes - Mother; Kidney Disease: Yes - Mother; Lung Disease: Yes - Father; Seizures: No; Stroke: No; Thyroid Problems: Yes - Paternal Grandparents; Tuberculosis: No; Never smoker; Marital Status - Married; Alcohol Use: Never; Drug Use: No History; Caffeine Use: Daily - coffee; Financial Concerns: No; Food, Clothing or Shelter Needs: No; Support System Lacking: No; Transportation Concerns: No Electronic Signature(s) Signed: 10/11/2022 2:44:46 PM By: [name] MD FACS Entered By: [name] on 10/11/2022 14:38:16 -------------------------------------------------------------------------------- SuperBill Details Patient Name: Date of Service: [name], [name] 10/11/2022 Medical Record Number: [id number] Patient Account Number: [id number] Date of Birth/Sex: Treating RN: 1960-05-17 (62 y.o. 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 E66.01 Morbid (severe) obesity due to excess calories I10 Essential (primary) hypertension R60.0 Localized  edema Facility Procedures : ANALILIA, RUDZIK Code: [id number] Sahaana H ([id number]) L L Description: 97597 - DEBRIDE WOUND 1ST 20 SQ CM OR < ICD-10 Diagnosis Description [phone] 97.212 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 Modifier: [id number] Quantity: 1 27.pdf Page 14 of 14 Physician Procedures : CPT4 Code Description Modifier [id number] 99214 - WC PHYS LEVEL 4 - EST PT 25 ICD-10 Diagnosis Description 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

## 2022-10-11 NOTE — Progress Notes (Addendum)
[name], [name] ([id number]) [id number] Page 1 of 8 Visit Report for 10/11/2022 Arrival Information Details Patient Name: Date of Service: [name] 10/11/2022 1:45 PM Medical Record Number: [id number] Patient Account Number: [id number] Date of Birth/Sex: Treating RN: [birth year] (62 y.o. F) Primary Care Deirdre Gryder: Nira Conn Other Clinician: Referring Ala Capri: Treating Somnang Mahan/Extender: Andrey Campanile in Treatment: 37 Visit Information History Since Last Visit Added or deleted any medications: No Patient Arrived: Ambulatory Any new allergies or adverse reactions: No Arrival Time: 13:51 Had a fall or experienced change in No Accompanied By: self activities of daily living that may affect Transfer Assistance: None risk of falls: Patient Identification Verified: Yes Signs or symptoms of abuse/neglect since last visito No Secondary Verification Process Completed: Yes Hospitalized since last visit: No Patient Requires Transmission-Based Precautions: No Implantable device outside of the clinic excluding No Patient Has Alerts: No cellular tissue based products placed in the center since last visit: Has Dressing in Place as Prescribed: Yes Pain Present Now: No Electronic Signature(s) Signed: 10/11/2022 2:07:25 PM By: Karl Ito Entered By: Karl Ito on 10/11/2022 13:51:30 -------------------------------------------------------------------------------- Compression Therapy Details Patient Name: Date of Service: [name] Madura, [name] 10/11/2022 1:45 PM Medical Record Number: [id number] Patient Account Number: [id number] Date of Birth/Sex: Treating RN: Sep 24, 1960 (62 y.o. Gevena Mart Primary Care Gottlieb Zuercher: Nira Conn Other Clinician: Referring Krishna Heuer: Treating Aarica Wax/Extender: Andrey Campanile in Treatment: 37 Compression Therapy Performed for Wound Assessment: Wound #2  Right,Posterior Lower Leg Performed By: Leighton Parody, RN Compression Type: Three Layer Post Procedure Diagnosis Same as Pre-procedure Electronic Signature(s) Unsigned Entered By: Brenton Grills on 10/11/2022 14:28:52 Signature(s): AMBERNICOLE, USREY ([id number]) [id number] Date(s): [phone].pdf Page 2 of 8 -------------------------------------------------------------------------------- Compression Therapy Details Patient Name: Date of Service: [name] 10/11/2022 1:45 PM Medical Record Number: [id number] Patient Account Number: [id number] Date of Birth/Sex: Treating RN: 1960/06/27 (62 y.o. Gevena Mart Primary Care Conan Mcmanaway: Nira Conn Other Clinician: Referring Tyanne Derocher: Treating Rhyan Radler/Extender: Andrey Campanile in Treatment: 37 Compression Therapy Performed for Wound Assessment: Wound #3 Left,Posterior Lower Leg Performed By: Clinician Brenton Grills, RN Compression Type: Three Layer Post Procedure Diagnosis Same as Pre-procedure Electronic Signature(s) Unsigned Entered By: Brenton Grills on 10/11/2022 14:28:52 -------------------------------------------------------------------------------- Lower Extremity Assessment Details Patient Name: Date of Service: [name] 10/11/2022 1:45 PM Medical Record Number: [id number] Patient Account Number: [id number] Date of Birth/Sex: Treating RN: 13-Mar-1961 (62 y.o. Gevena Mart Primary Care Anabeth Chilcott: Nira Conn Other Clinician: Referring Carmichael Burdette: Treating Zaide Kardell/Extender: Andrey Campanile in Treatment: 37 Edema Assessment Assessed: [name]: No] Franne Forts: No] Edema: [Left: Yes] [Right: Yes] Calf Left: Right: Point of Measurement: From Medial Instep 38.5 cm 40 cm Ankle Left: Right: Point of Measurement: From Medial Instep 22.5 cm 23.5 cm Vascular Assessment Pulses: Dorsalis Pedis Palpable: [Left:Yes]  [Right:Yes] Electronic Signature(s) Unsigned Entered By: Brenton Grills on 10/11/2022 14:13:32 Signature(s): [name] ([id number]) [id number] Date(s): [id number] Page 3 of 8 -------------------------------------------------------------------------------- Multi Wound Chart Details Patient Name: Date of Service: [name] 10/11/2022 1:45 PM Medical Record Number: [id number] Patient Account Number: [id number] Date of Birth/Sex: Treating RN: 04-Jan-1961 (62 y.o. F) Primary Care Madylin Fairbank: Nira Conn Other Clinician: Referring Chelby Salata: Treating Rontavious Albright/Extender: Andrey Campanile in Treatment: 37 Vital Signs Height(in): 61 Pulse(bpm): 90 Weight(lbs): 277 Blood Pressure(mmHg): 155/88 Body Mass Index(BMI): 52.3 Temperature(F): 98.2 Respiratory Rate(breaths/min): 20 [2:Photos: No Photos] [N/A:N/A] Right, Posterior Lower Leg Left, Posterior Lower Leg  N/A Wound Location: Insect Bite Trauma N/A Wounding Event: Venous Leg Ulcer Venous Leg Ulcer N/A Primary Etiology: Anemia, Sleep Apnea, Hypertension, Anemia, Sleep Apnea, Hypertension, N/A Comorbid History: Peripheral Venous Disease Peripheral Venous Disease 11/21/2021 06/28/2022 N/A Date Acquired: 37 10 N/A Weeks of Treatment: Open Open N/A Wound Status: No No N/A Wound Recurrence: 1.2x1x0.1 1x1x0.1 N/A Measurements L x W x D (cm) 0.942 0.785 N/A A (cm) : rea 0.094 0.079 N/A Volume (cm) : -185.50% 40.80% N/A % Reduction in A rea: 5.10% 40.60% N/A % Reduction in Volume: Full Thickness Without Exposed Full Thickness Without Exposed N/A Classification: Support Structures Support Structures Medium Medium N/A Exudate A mount: Serosanguineous Serosanguineous N/A Exudate Type: red, brown red, brown N/A Exudate Color: Flat and Intact Flat and Intact N/A Wound Margin: Medium (34-66%) Medium (34-66%) N/A Granulation A mount: Red Red N/A Granulation  Quality: Medium (34-66%) Medium (34-66%) N/A Necrotic A mount: Fat Layer (Subcutaneous Tissue): Yes Fat Layer (Subcutaneous Tissue): Yes N/A Exposed Structures: Fascia: No Fascia: No Tendon: No Tendon: No Muscle: No Muscle: No Joint: No Joint: No Bone: No Bone: No Small (1-33%) Small (1-33%) N/A Epithelialization: Debridement - Selective/Open Wound Debridement - Selective/Open Wound N/A Debridement: Pre-procedure Verification/Time Out 14:30 14:30 N/A Taken: Lidocaine 4% Topical Solution Lidocaine 4% Topical Solution N/A Pain Control: [employer] N/A Tissue Debrided: Non-Viable Tissue Non-Viable Tissue N/A Level: 0.94 0.78 N/A Debridement A (sq cm): rea Curette Curette N/A Instrument: Large Large N/A Bleeding: Pressure Pressure N/A Hemostasis A chieved: 0 0 N/A Procedural Pain: 0 0 N/A Post Procedural Pain: Procedure was tolerated well Procedure was tolerated well N/A Debridement Treatment Response: 1.2x1x0.1 1x1x0.1 N/A Post Debridement Measurements L x W x D (cm) 0.094 0.079 N/A Post Debridement Volume: (cm) Excoriation: No Rash: Yes N/A Periwound Skin Texture: Scarring: No No Abnormalities Noted No Abnormalities Noted N/A Periwound Skin Moisture: Hemosiderin Staining: Yes Erythema: No N/A Periwound Skin Color: Atrophie Blanche: No Ecchymosis: No Erythema: No Rubor: No TERRELLE, ECKELBARGER ([id number]) [id number] Page 4 of 8 No Abnormality No Abnormality N/A Temperature: Yes Yes N/A Tenderness on Palpation: Compression Therapy Compression Therapy N/A Procedures Performed: Debridement Debridement Treatment Notes Electronic Signature(s) Signed: 10/11/2022 2:36:31 PM By: [name] MD FACS Previous Signature: 10/11/2022 2:11:36 PM Version By: [name] MD FACS Entered By: [name] on 10/11/2022 14:36:31 -------------------------------------------------------------------------------- Multi-Disciplinary  Care Plan Details Patient Name: Date of Service: [place], [name] 10/11/2022 1:45 PM Medical Record Number: [id number] Patient Account Number: [id number] Date of Birth/Sex: Treating RN: [birth year] (62 y.o. Gevena Mart Primary Care Daris Aristizabal: Nira Conn Other Clinician: Referring Vedder Brittian: Treating Jodie Cavey/Extender: Andrey Campanile in Treatment: 37 Multidisciplinary Care Plan reviewed with physician Active Inactive Necrotic Tissue Nursing Diagnoses: Impaired tissue integrity related to necrotic/devitalized tissue Knowledge deficit related to management of necrotic/devitalized tissue Goals: Necrotic/devitalized tissue will be minimized in the wound bed Date Initiated: 01/23/2022 Target Resolution Date: 11/06/2022 Goal Status: Active Patient/caregiver will verbalize understanding of reason and process for debridement of necrotic tissue Date Initiated: 01/23/2022 Target Resolution Date: 11/06/2022 Goal Status: Active Interventions: Assess patient pain level pre-, during and post procedure and prior to discharge Provide education on necrotic tissue and debridement process Treatment Activities: Apply topical anesthetic as ordered : 01/23/2022 Notes: Venous Leg Ulcer Nursing Diagnoses: Knowledge deficit related to disease process and management Potential for venous Insuffiency (use before diagnosis confirmed) Goals: Patient will maintain optimal edema control Date Initiated: 07/05/2022 Target Resolution Date: 11/06/2022 Goal Status: Active Interventions: Assess peripheral edema status  every visit. Compression as ordered Provide education on venous insufficiency Treatment Activities: Therapeutic compression applied : 07/05/2022 NICKOLE, CUNIGAN ([id number]) [phone].pdf Page 5 of 8 Notes: Wound/Skin Impairment Nursing Diagnoses: Impaired tissue integrity Knowledge deficit related to ulceration/compromised skin  integrity Goals: Patient/caregiver will verbalize understanding of skin care regimen Date Initiated: 01/23/2022 Target Resolution Date: 11/06/2022 Goal Status: Active Interventions: Assess patient/caregiver ability to obtain necessary supplies Assess ulceration(s) every visit Treatment Activities: Skin care regimen initiated : 01/23/2022 Topical wound management initiated : 01/23/2022 Notes: Electronic Signature(s) Unsigned Entered By: Brenton Grills on 10/11/2022 14:16:44 -------------------------------------------------------------------------------- Pain Assessment Details Patient Name: Date of Service: [name] 10/11/2022 1:45 PM Medical Record Number: [id number] Patient Account Number: [id number] Date of Birth/Sex: Treating RN: Jun 14, 1960 (62 y.o. F) Primary Care Lashayla Armes: Nira Conn Other Clinician: Referring Jarvin Ogren: Treating Rosene Pilling/Extender: Andrey Campanile in Treatment: 37 Active Problems Location of Pain Severity and Description of Pain Patient Has Paino No Site Locations Pain Management and Medication Current Pain Management: Electronic Signature(s) Signed: 10/11/2022 2:07:25 PM By: Karl Ito Mccaslin,Signed: 10/11/2022 2:07:25 PM By: Eric Form ([id number]) [id number] Page 6 of 8 Entered By: Karl Ito on 10/11/2022 13:51:52 -------------------------------------------------------------------------------- Patient/Caregiver Education Details Patient Name: Date of Service: [name] 7/5/2024andnbsp1:45 PM Medical Record Number: [id number] Patient Account Number: [id number] Date of Birth/Gender: Treating RN: 1960-08-26 (62 y.o. Gevena Mart Primary Care Physician: Nira Conn Other Clinician: Referring Physician: Treating Physician/Extender: Andrey Campanile in Treatment: 37 Education Assessment Education Provided  To: Patient Education Topics Provided Wound/Skin Impairment: Methods: Demonstration Responses: State content correctly Electronic Signature(s) Unsigned Entered By: Brenton Grills on 10/11/2022 14:17:16 -------------------------------------------------------------------------------- Wound Assessment Details Patient Name: Date of Service: [name] 10/11/2022 1:45 PM Medical Record Number: [id number] Patient Account Number: [id number] Date of Birth/Sex: Treating RN: June 10, 1960 (62 y.o. F) Primary Care Lorriane Dehart: Nira Conn Other Clinician: Referring Akosua Constantine: Treating Nanetta Wiegman/Extender: Andrey Campanile in Treatment: 37 Wound Status Wound Number: 2 Primary Venous Leg Ulcer Etiology: Wound Location: Right, Posterior Lower Leg Wound Status: Open Wounding Event: Insect Bite Comorbid Anemia, Sleep Apnea, Hypertension, Peripheral Venous Date Acquired: 11/21/2021 History: Disease Weeks Of Treatment: 37 Clustered Wound: No Wound Measurements Length: (cm) 1.2 Width: (cm) 1 Depth: (cm) 0.1 Area: (cm) 0.942 Volume: (cm) 0.094 % Reduction in Area: -185.5% % Reduction in Volume: 5.1% Epithelialization: Small (1-33%) Tunneling: No Undermining: No Wound Description Classification: Full Thickness Without Exposed Support Structures Wound Margin: Flat and Intact Exudate Amount: Medium LOKI, ESCARCEGA ([id number]) Exudate Type: Serosanguineous Exudate Color: red, brown Foul Odor After Cleansing: No Slough/Fibrino Yes [phone].pdf Page 7 of 8 Wound Bed Granulation Amount: Medium (34-66%) Exposed Structure Granulation Quality: Red Fascia Exposed: No Necrotic Amount: Medium (34-66%) Fat Layer (Subcutaneous Tissue) Exposed: Yes Necrotic Quality: Adherent Slough Tendon Exposed: No Muscle Exposed: No Joint Exposed: No Bone Exposed: No Periwound Skin Texture Texture Color No Abnormalities Noted: Yes No Abnormalities  Noted: No Atrophie Blanche: No Moisture Ecchymosis: No No Abnormalities Noted: Yes Erythema: No Hemosiderin Staining: Yes Rubor: No Temperature / Pain Temperature: No Abnormality Tenderness on Palpation: Yes Electronic Signature(s) Unsigned Previous Signature: 10/11/2022 2:07:25 PM Version By: Karl Ito Entered By: Brenton Grills on 10/11/2022 14:15:21 -------------------------------------------------------------------------------- Wound Assessment Details Patient Name: Date of Service: [place], SURA [name] 10/11/2022 1:45 PM Medical Record Number: [id number] Patient Account Number: [id number] Date of Birth/Sex: Treating RN: 03-21-1961 (62 y.o. F) Primary Care Annamae Shivley: Nira Conn Other Clinician: Referring Nashaly Dorantes: Treating  Valaria Kohut/Extender: Heinz Knuckles Weeks in Treatment: 37 Wound Status Wound Number: 3 Primary Venous Leg Ulcer Etiology: Wound Location: Left, Posterior Lower Leg Wound Status: Open Wounding Event: Trauma Comorbid Anemia, Sleep Apnea, Hypertension, Peripheral Venous Date Acquired: 06/28/2022 History: Disease Weeks Of Treatment: 10 Clustered Wound: No Photos Wound Measurements Length: (cm) 1 Width: (cm) 1 Depth: (cm) 0.1 SYMPHANI, BOWAN ([id number]) Area: (cm) 0.785 Volume: (cm) 0.079 % Reduction in Area: 40.8% % Reduction in Volume: 40.6% Epithelialization: Small (1-33%) [id number] Page 8 of 8 Tunneling: No Undermining: No Wound Description Classification: Full Thickness Without Exposed Support Structures Wound Margin: Flat and Intact Exudate Amount: Medium Exudate Type: Serosanguineous Exudate Color: red, brown Foul Odor After Cleansing: No Slough/Fibrino Yes Wound Bed Granulation Amount: Medium (34-66%) Exposed Structure Granulation Quality: Red Fascia Exposed: No Necrotic Amount: Medium (34-66%) Fat Layer (Subcutaneous Tissue) Exposed: Yes Necrotic Quality: Adherent  Slough Tendon Exposed: No Muscle Exposed: No Joint Exposed: No Bone Exposed: No Periwound Skin Texture Texture Color No Abnormalities Noted: Yes No Abnormalities Noted: Yes Moisture Temperature / Pain No Abnormalities Noted: Yes Temperature: No Abnormality Tenderness on Palpation: Yes Electronic Signature(s) Unsigned Previous Signature: 10/11/2022 2:07:25 PM Version By: Karl Ito Entered By: Brenton Grills on 10/11/2022 14:15:55 -------------------------------------------------------------------------------- Vitals Details Patient Name: Date of Service: [name] Madura, [name] 10/11/2022 1:45 PM Medical Record Number: [id number] Patient Account Number: [id number] Date of Birth/Sex: Treating RN: Sep 01, 1960 (62 y.o. F) Primary Care Erline Siddoway: Nira Conn Other Clinician: Referring Tarquin Welcher: Treating Porchia Sinkler/Extender: Andrey Campanile in Treatment: 37 Vital Signs Time Taken: 13:51 Temperature (F): 98.2 Height (in): 61 Pulse (bpm): 90 Weight (lbs): 277 Respiratory Rate (breaths/min): 20 Body Mass Index (BMI): 52.3 Blood Pressure (mmHg): 155/88 Reference Range: 80 - 120 mg / dl Electronic Signature(s) Signed: 10/11/2022 2:07:25 PM By: Karl Ito Entered By: Karl Ito on 10/11/2022 13:51:46

## 2022-10-21 ENCOUNTER — Encounter (HOSPITAL_BASED_OUTPATIENT_CLINIC_OR_DEPARTMENT_OTHER): Payer: BC Managed Care – PPO | Admitting: General Surgery

## 2022-10-21 DIAGNOSIS — L97212 Non-pressure chronic ulcer of right calf with fat layer exposed: Secondary | ICD-10-CM | POA: Diagnosis not present

## 2022-10-21 NOTE — Progress Notes (Signed)
HARSIMRAN, WESTMAN ([id number]) [id number] Page 1 of 10 Visit Report for 10/21/2022 Arrival Information Details Patient Name: Date of Service: [name], [name] [name] H. 10/21/2022 11:45 A M Medical Record Number: [id number] Patient Account Number: [id number] Date of Birth/Sex: Treating RN: 01/25/1961 (62 y.o. [name] Primary Care Dermot Gremillion: Nira Conn Other Clinician: Referring Nobuko Gsell: Treating Giani Betzold/Extender: Andrey Campanile in Treatment: 38 Visit Information History Since Last Visit Added or deleted any medications: No Patient Arrived: Ambulatory Any new allergies or adverse reactions: No Arrival Time: 11:45 Had a fall or experienced change in No Accompanied By: self activities of daily living that may affect Transfer Assistance: None risk of falls: Patient Identification Verified: Yes Signs or symptoms of abuse/neglect since last visito No Patient Requires Transmission-Based Precautions: No Hospitalized since last visit: No Patient Has Alerts: No Implantable device outside of the clinic excluding No cellular tissue based products placed in the center since last visit: Has Dressing in Place as Prescribed: Yes Has Compression in Place as Prescribed: Yes Pain Present Now: No Electronic Signature(s) Signed: 10/21/2022 4:56:13 PM By: Karie Schwalbe RN Entered By: Karie Schwalbe on 10/21/2022 11:46:00 -------------------------------------------------------------------------------- Compression Therapy Details Patient Name: Date of Service: [name], [name] 10/21/2022 11:45 A M Medical Record Number: [id number] Patient Account Number: [id number] Date of Birth/Sex: Treating RN: Jun 10, 1960 (62 y.o. [name] Primary Care Adja Ruff: Nira Conn Other Clinician: Referring Ma Munoz: Treating Johari Pinney/Extender: Andrey Campanile in Treatment: 38 Compression Therapy Performed for  Wound Assessment: Wound #2 Right,Posterior Lower Leg Performed By: Clinician Karie Schwalbe, RN Compression Type: Three Layer Post Procedure Diagnosis Same as Pre-procedure Electronic Signature(s) Signed: 10/21/2022 4:56:13 PM By: Karie Schwalbe RN Entered By: Karie Schwalbe on 10/21/2022 12:09:44 [name] ([id number]) [id number] Page 2 of 10 -------------------------------------------------------------------------------- Compression Therapy Details Patient Name: Date of Service: [name] 10/21/2022 11:45 A M Medical Record Number: [id number] Patient Account Number: [id number] Date of Birth/Sex: Treating RN: 10-Aug-1960 (62 y.o. [name] Primary Care Shey Bartmess: Nira Conn Other Clinician: Referring Reg Bircher: Treating Abid Bolla/Extender: Andrey Campanile in Treatment: 38 Compression Therapy Performed for Wound Assessment: Wound #3 Left,Posterior Lower Leg Performed By: Clinician Karie Schwalbe, RN Compression Type: Three Layer Post Procedure Diagnosis Same as Pre-procedure Electronic Signature(s) Signed: 10/21/2022 4:56:13 PM By: Karie Schwalbe RN Entered By: Karie Schwalbe on 10/21/2022 12:09:44 -------------------------------------------------------------------------------- Encounter Discharge Information Details Patient Name: Date of Service: [name], [name] 10/21/2022 11:45 A M Medical Record Number: [id number] Patient Account Number: [id number] Date of Birth/Sex: Treating RN: [birth year] (62 y.o. [name] Primary Care Danicia Terhaar: Nira Conn Other Clinician: Referring Mylei Brackeen: Treating Josejuan Hoaglin/Extender: Andrey Campanile in Treatment: 38 Encounter Discharge Information Items Post Procedure Vitals Discharge Condition: Stable Temperature (F): 98.2 Ambulatory Status: Ambulatory Pulse (bpm): 87 Discharge Destination: Home Respiratory Rate  (breaths/min): 18 Transportation: Private Auto Blood Pressure (mmHg): 154/67 Accompanied By: self Schedule Follow-up Appointment: Yes Clinical Summary of Care: Patient Declined Electronic Signature(s) Signed: 10/21/2022 4:56:13 PM By: Karie Schwalbe RN Entered By: Karie Schwalbe on 10/21/2022 12:33:46 -------------------------------------------------------------------------------- Lower Extremity Assessment Details Patient Name: Date of Service: [place], [name] 10/21/2022 11:45 A M Medical Record Number: [id number] Patient Account Number: [id number] Date of Birth/Sex: Treating RN: 1960-05-29 (62 y.o. [name] Primary Care Eliska Hamil: Nira Conn Other Clinician: Referring Merryn Thaker: Treating Mataeo Ingwersen/Extender: Andrey Campanile in Treatment: 38 Edema Assessment Assessed: [Left: No] [Right: No] Edema: [Left: Yes] [Right: Yes] Calf Pickert,  Romie Minus ([id number]) [id number] Page 3 of 10 Left: Right: Point of Measurement: From Medial Instep 38.5 cm 40 cm Ankle Left: Right: Point of Measurement: From Medial Instep 22.5 cm 23.5 cm Vascular Assessment Pulses: Dorsalis Pedis Palpable: [Left:Yes] [Right:Yes] Electronic Signature(s) Signed: 10/21/2022 4:56:13 PM By: Karie Schwalbe RN Entered By: Karie Schwalbe on 10/21/2022 11:46:31 -------------------------------------------------------------------------------- Multi Wound Chart Details Patient Name: Date of Service: [name], [name] 10/21/2022 11:45 A M Medical Record Number: [id number] Patient Account Number: [id number] Date of Birth/Sex: Treating RN: 25-Oct-1960 (62 y.o. F) Primary Care Trinidee Schrag: Nira Conn Other Clinician: Referring Ellenore Roscoe: Treating Jasiah Buntin/Extender: Andrey Campanile in Treatment: 38 Vital Signs Height(in): 61 Pulse(bpm): 87 Weight(lbs): 277 Blood Pressure(mmHg): 154/67 Body Mass Index(BMI):  52.3 Temperature(F): 98.2 Respiratory Rate(breaths/min): 18 [2:Photos:] [N/A:N/A] Right, Posterior Lower Leg Left, Posterior Lower Leg N/A Wound Location: Insect Bite Trauma N/A Wounding Event: Venous Leg Ulcer Venous Leg Ulcer N/A Primary Etiology: Anemia, Sleep Apnea, Hypertension, Anemia, Sleep Apnea, Hypertension, N/A Comorbid History: Peripheral Venous Disease Peripheral Venous Disease 11/21/2021 06/28/2022 N/A Date Acquired: 6 11 N/A Weeks of Treatment: Open Open N/A Wound Status: No No N/A Wound Recurrence: 1x0.7x0.1 1x0.9x0.1 N/A Measurements L x W x D (cm) 0.55 0.707 N/A A (cm) : rea 0.055 0.071 N/A Volume (cm) : -66.70% 46.70% N/A % Reduction in Area: 44.40% 46.60% N/A % Reduction in Volume: Full Thickness Without Exposed Full Thickness Without Exposed N/A Classification: Support Structures Support Structures Medium Medium N/A Exudate Amount: Serosanguineous Serosanguineous N/A Exudate Type: red, brown red, brown N/A Exudate Color: Flat and Intact Flat and Intact N/A Wound Margin: Medium (34-66%) Medium (34-66%) N/A Granulation Amount: Red Red N/A Granulation Quality: Medium (34-66%) Medium (34-66%) N/A Necrotic Amount: [name], [name] ([id number]) [id number] Page 4 of 10 Fat Layer (Subcutaneous Tissue): Yes Fat Layer (Subcutaneous Tissue): Yes N/A Exposed Structures: Fascia: No Fascia: No Tendon: No Tendon: No Muscle: No Muscle: No Joint: No Joint: No Bone: No Bone: No Small (1-33%) Small (1-33%) N/A Epithelialization: Debridement - Selective/Open Wound Debridement - Selective/Open Wound N/A Debridement: Pre-procedure Verification/Time Out 11:56 11:56 N/A Taken: Lidocaine 5% topical ointment Lidocaine 5% topical ointment N/A Pain Control: Necrotic/Eschar, [facility] N/A Tissue Debrided: Non-Viable Tissue Non-Viable Tissue N/A Level: 0.55 0.71 N/A Debridement A (sq cm): rea Curette Curette  N/A Instrument: Minimum Minimum N/A Bleeding: Pressure Pressure N/A Hemostasis A chieved: 0 0 N/A Procedural Pain: 0 0 N/A Post Procedural Pain: Procedure was tolerated well Procedure was tolerated well N/A Debridement Treatment Response: 1x0.7x0.1 1x0.9x0.1 N/A Post Debridement Measurements L x W x D (cm) 0.055 0.071 N/A Post Debridement Volume: (cm) Excoriation: No Rash: Yes N/A Periwound Skin Texture: Scarring: No No Abnormalities Noted No Abnormalities Noted N/A Periwound Skin Moisture: Hemosiderin Staining: Yes Erythema: No N/A Periwound Skin Color: Atrophie Blanche: No Ecchymosis: No Erythema: No Rubor: No No Abnormality No Abnormality N/A Temperature: Yes Yes N/A Tenderness on Palpation: Compression Therapy Compression Therapy N/A Procedures Performed: Debridement Debridement Treatment Notes Electronic Signature(s) Signed: 10/21/2022 12:29:07 PM By: [name] MD FACS Entered By: [name] on 10/21/2022 12:29:07 -------------------------------------------------------------------------------- Multi-Disciplinary Care Plan Details Patient Name: Date of Service: [place], [name] 10/21/2022 11:45 A M Medical Record Number: [id number] Patient Account Number: [id number] Date of Birth/Sex: Treating RN: 09-25-1960 (62 y.o. [name] Primary Care Kyrene Longan: Nira Conn Other Clinician: Referring Shaunda Tipping: Treating Tobe Kervin/Extender: Andrey Campanile in Treatment: 38 Multidisciplinary Care Plan reviewed with physician Active Inactive Necrotic Tissue Nursing Diagnoses: Impaired tissue integrity  related to necrotic/devitalized tissue Knowledge deficit related to management of necrotic/devitalized tissue Goals: Necrotic/devitalized tissue will be minimized in the wound bed Date Initiated: 01/23/2022 Target Resolution Date: 12/07/2022 Goal Status: Active Patient/caregiver will verbalize understanding of reason and  process for debridement of necrotic tissue Date Initiated: 01/23/2022 Target Resolution Date: 12/07/2022 Goal Status: Active Interventions: SHAHEEN, [name] ([id number]) [id number] Page 5 of 10 Assess patient pain level pre-, during and post procedure and prior to discharge Provide education on necrotic tissue and debridement process Treatment Activities: Apply topical anesthetic as ordered : 01/23/2022 Notes: Venous Leg Ulcer Nursing Diagnoses: Knowledge deficit related to disease process and management Potential for venous Insuffiency (use before diagnosis confirmed) Goals: Patient will maintain optimal edema control Date Initiated: 07/05/2022 Target Resolution Date: 12/07/2022 Goal Status: Active Interventions: Assess peripheral edema status every visit. Compression as ordered Provide education on venous insufficiency Treatment Activities: Therapeutic compression applied : 07/05/2022 Notes: Wound/Skin Impairment Nursing Diagnoses: Impaired tissue integrity Knowledge deficit related to ulceration/compromised skin integrity Goals: Patient/caregiver will verbalize understanding of skin care regimen Date Initiated: 01/23/2022 Target Resolution Date: 12/07/2022 Goal Status: Active Interventions: Assess patient/caregiver ability to obtain necessary supplies Assess ulceration(s) every visit Treatment Activities: Skin care regimen initiated : 01/23/2022 Topical wound management initiated : 01/23/2022 Notes: Electronic Signature(s) Signed: 10/21/2022 4:56:13 PM By: Karie Schwalbe RN Entered By: Karie Schwalbe on 10/21/2022 12:33:08 -------------------------------------------------------------------------------- Pain Assessment Details Patient Name: Date of Service: [name], [name] 10/21/2022 11:45 A M Medical Record Number: [id number] Patient Account Number: [id number] Date of Birth/Sex: Treating RN: 1961/04/04 (62 y.o. [name] Primary Care Chaze Hruska: Nira Conn Other Clinician: Referring Filiberto Wamble: Treating Onyx Schirmer/Extender: Andrey Campanile in Treatment: 38 Active Problems Location of Pain Severity and Description of Pain Patient Has Paino No Site Locations [name], [name] ([id number]) [id number] Page 6 of 10 Pain Management and Medication Current Pain Management: Electronic Signature(s) Signed: 10/21/2022 4:56:13 PM By: Karie Schwalbe RN Entered By: Karie Schwalbe on 10/21/2022 11:46:58 -------------------------------------------------------------------------------- Patient/Caregiver Education Details Patient Name: Date of Service: [name], [name] [place] [name] 7/15/2024andnbsp11:45 A M Medical Record Number: [id number] Patient Account Number: [id number] Date of Birth/Gender: Treating RN: [birth year] (62 y.o. [name] Primary Care Physician: Nira Conn Other Clinician: Referring Physician: Treating Physician/Extender: Andrey Campanile in Treatment: 18 Education Assessment Education Provided To: Patient Education Topics Provided Wound/Skin Impairment: Methods: Explain/Verbal Responses: Return demonstration correctly Electronic Signature(s) Signed: 10/21/2022 4:56:13 PM By: Karie Schwalbe RN Entered By: Karie Schwalbe on 10/21/2022 12:33:27 -------------------------------------------------------------------------------- Wound Assessment Details Patient Name: Date of Service: [name], [name] 10/21/2022 11:45 A M Medical Record Number: [id number] Patient Account Number: [id number] Date of Birth/Sex: Treating RN: 1961-01-03 (62 y.o. [name] Primary Care Yamari Ventola: Nira Conn Other Clinician: Referring Jessi Jessop: Treating Lagena Strand/Extender: [name], [name], Romie Minus ([id number]) [phone].pdf Page 7 of 10 Weeks in Treatment: 38 Wound  Status Wound Number: 2 Primary Venous Leg Ulcer Etiology: Wound Location: Right, Posterior Lower Leg Wound Status: Open Wounding Event: Insect Bite Comorbid Anemia, Sleep Apnea, Hypertension, Peripheral Venous Date Acquired: 11/21/2021 History: Disease Weeks Of Treatment: 38 Clustered Wound: No Photos Wound Measurements Length: (cm) 1 Width: (cm) 0.7 Depth: (cm) 0.1 Area: (cm) 0.55 Volume: (cm) 0.055 % Reduction in Area: -66.7% % Reduction in Volume: 44.4% Epithelialization: Small (1-33%) Tunneling: No Undermining: No Wound Description Classification: Full Thickness Without Exposed Support Structures Wound Margin: Flat and Intact Exudate Amount: Medium Exudate Type: Serosanguineous Exudate Color: red, brown Foul Odor After  Cleansing: No Slough/Fibrino Yes Wound Bed Granulation Amount: Medium (34-66%) Exposed Structure Granulation Quality: Red Fascia Exposed: No Necrotic Amount: Medium (34-66%) Fat Layer (Subcutaneous Tissue) Exposed: Yes Necrotic Quality: Adherent Slough Tendon Exposed: No Muscle Exposed: No Joint Exposed: No Bone Exposed: No Periwound Skin Texture Texture Color No Abnormalities Noted: Yes No Abnormalities Noted: No Atrophie Blanche: No Moisture Ecchymosis: No No Abnormalities Noted: Yes Erythema: No Hemosiderin Staining: Yes Rubor: No Temperature / Pain Temperature: No Abnormality Tenderness on Palpation: Yes Treatment Notes Wound #2 (Lower Leg) Wound Laterality: Right, Posterior Cleanser Soap and Water Discharge Instruction: May shower and wash wound with dial antibacterial soap and water prior to dressing change. Wound Cleanser Discharge Instruction: Cleanse the wound with wound cleanser prior to applying a clean dressing using gauze sponges, not tissue or cotton balls. Peri-Wound Care Sween Lotion (Moisturizing lotion) Discharge Instruction: Apply moisturizing lotion as directed [name], [name] ([id number])  [id number] Page 8 of 10 Topical Gentamicin Discharge Instruction: As directed by physician Mupirocin Ointment Discharge Instruction: Apply Mupirocin (Bactroban) as instructed Primary Dressing Promogran Prisma Matrix, 4.34 (sq in) (silver collagen) Discharge Instruction: Moisten collagen with saline or hydrogel Secondary Dressing Woven Gauze Sponge, Non-Sterile 4x4 in Discharge Instruction: Apply over primary dressing as directed. Secured With Compression Wrap Urgo K2 Lite, (equivalent to a 3 layer) two layer compression system, regular Discharge Instruction: Apply Urgo K2 Lite as directed (alternative to 3 layer compression). Compression Stockings Add-Ons Electronic Signature(s) Signed: 10/21/2022 4:56:13 PM By: Karie Schwalbe RN Entered By: Karie Schwalbe on 10/21/2022 11:58:08 -------------------------------------------------------------------------------- Wound Assessment Details Patient Name: Date of Service: [place], [name] 10/21/2022 11:45 A M Medical Record Number: [id number] Patient Account Number: [id number] Date of Birth/Sex: Treating RN: 03/08/1961 (62 y.o. [name] Primary Care Dandrea Widdowson: Nira Conn Other Clinician: Referring Rik Wadel: Treating Chidera Dearcos/Extender: Andrey Campanile in Treatment: 38 Wound Status Wound Number: 3 Primary Venous Leg Ulcer Etiology: Wound Location: Left, Posterior Lower Leg Wound Status: Open Wounding Event: Trauma Comorbid Anemia, Sleep Apnea, Hypertension, Peripheral Venous Date Acquired: 06/28/2022 History: Disease Weeks Of Treatment: 11 Clustered Wound: No Photos Wound Measurements Length: (cm) 1 Width: (cm) 0.9 Depth: (cm) 0.1 Area: (cm) 0.707 Volume: (cm) 0.071 [name], [name] ([id number]) Wound Description Classification: Full Thickness Without Exposed Support Structures Wound Margin: Flat and Intact Exudate Amount: Medium Exudate Type:  Serosanguineous Exudate Color: red, brown Foul Odor After Cleansing: No Slough/Fibrino Yes % Reduction in Area: 46.7% % Reduction in Volume: 46.6% Epithelialization: Small (1-33%) Tunneling: No Undermining: No [id number] Page 9 of 10 Wound Bed Granulation Amount: Medium (34-66%) Exposed Structure Granulation Quality: Red Fascia Exposed: No Necrotic Amount: Medium (34-66%) Fat Layer (Subcutaneous Tissue) Exposed: Yes Necrotic Quality: Adherent Slough Tendon Exposed: No Muscle Exposed: No Joint Exposed: No Bone Exposed: No Periwound Skin Texture Texture Color No Abnormalities Noted: Yes No Abnormalities Noted: Yes Moisture Temperature / Pain No Abnormalities Noted: Yes Temperature: No Abnormality Tenderness on Palpation: Yes Treatment Notes Wound #3 (Lower Leg) Wound Laterality: Left, Posterior Cleanser Soap and Water Discharge Instruction: May shower and wash wound with dial antibacterial soap and water prior to dressing change. Wound Cleanser Discharge Instruction: Cleanse the wound with wound cleanser prior to applying a clean dressing using gauze sponges, not tissue or cotton balls. Peri-Wound Care Sween Lotion (Moisturizing lotion) Discharge Instruction: Apply moisturizing lotion as directed Topical Gentamicin Discharge Instruction: As directed by physician Mupirocin Ointment Discharge Instruction: Apply Mupirocin (Bactroban) as instructed Primary Dressing Promogran Prisma Matrix, 4.34 (sq in) (silver collagen) Discharge  Instruction: Moisten collagen with saline or hydrogel Secondary Dressing Woven Gauze Sponge, Non-Sterile 4x4 in Discharge Instruction: Apply over primary dressing as directed. Secured With Compression Wrap Urgo K2 Lite, (equivalent to a 3 layer) two layer compression system, regular Discharge Instruction: Apply Urgo K2 Lite as directed (alternative to 3 layer compression). Compression Stockings Add-Ons Electronic  Signature(s) Signed: 10/21/2022 4:56:13 PM By: Karie Schwalbe RN Entered By: Karie Schwalbe on 10/21/2022 11:59:01 [name] ([id number]) [id number] Page 10 of 10 -------------------------------------------------------------------------------- Vitals Details Patient Name: Date of Service: [name] 10/21/2022 11:45 A M Medical Record Number: [id number] Patient Account Number: [id number] Date of Birth/Sex: Treating RN: [birth year] (61 y.o. [name] Primary Care Hiilei Gerst: Nira Conn Other Clinician: Referring Heidee Audi: Treating Roshun Klingensmith/Extender: Andrey Campanile in Treatment: 38 Vital Signs Time Taken: 11:46 Temperature (F): 98.2 Height (in): 61 Pulse (bpm): 87 Weight (lbs): 277 Respiratory Rate (breaths/min): 18 Body Mass Index (BMI): 52.3 Blood Pressure (mmHg): 154/67 Reference Range: 80 - 120 mg / dl Electronic Signature(s) Signed: 10/21/2022 4:56:13 PM By: Karie Schwalbe RN Entered By: Karie Schwalbe on 10/21/2022 11:46:52

## 2022-10-21 NOTE — Progress Notes (Signed)
[place]OR[place], NOLTON ([id number]) [id number] Page 1 of 14 Visit Report for 10/21/2022 Chief Complaint Document Details Patient Name: Date of Service: [place], [name]. 10/21/2022 11:45 A M Medical Record Number: [id number] Patient Account Number: [id number] Date of Birth/Sex: Treating RN: October 14, 1960 (62 y.o. F) Primary Care Provider: Nira Conn Other Clinician: Referring Provider: Treating Provider/Extender: Andrey Campanile in Treatment: 67 Information Obtained from: Patient Chief Complaint RLE ulcer Electronic Signature(s) Signed: 10/21/2022 12:29:15 PM By: [name] MD FACS Entered By: [name] on 10/21/2022 12:29:14 -------------------------------------------------------------------------------- Debridement Details Patient Name: Date of Service: [name], [name] [name] H. 10/21/2022 11:45 A M Medical Record Number: [id number] Patient Account Number: [id number] Date of Birth/Sex: Treating RN: [birth year] (62 y.o. [name] Primary Care Provider: Nira Conn Other Clinician: Referring Provider: Treating Provider/Extender: Andrey Campanile in Treatment: 38 Debridement Performed for Assessment: Wound #2 Right,Posterior Lower Leg Performed By: Physician [name], MD Debridement Type: Debridement Severity of Tissue Pre Debridement: Fat layer exposed Level of Consciousness (Pre-procedure): Awake and Alert Pre-procedure Verification/Time Out Yes - 11:56 Taken: Start Time: 11:56 Pain Control: Lidocaine 5% topical ointment Percent of Wound Bed Debrided: 100% T Area Debrided (cm): otal 0.55 Tissue and other material debrided: Non-Viable, Eschar, Slough, Slough Level: Non-Viable Tissue Debridement Description: Selective/Open Wound Instrument: Curette Bleeding: Minimum Hemostasis Achieved: Pressure End Time: 11:58 Procedural Pain: 0 Post Procedural Pain: 0 Response to  Treatment: Procedure was tolerated well Level of Consciousness (Post- Awake and Alert procedure): Post Debridement Measurements of Total Wound Length: (cm) 1 Width: (cm) 0.7 Depth: (cm) 0.1 Volume: (cm) 0.055 Character of Wound/Ulcer Post Debridement: Improved [name], [name] ([id number]) [id number] Page 2 of 14 Severity of Tissue Post Debridement: Fat layer exposed Post Procedure Diagnosis Same as Pre-procedure Notes Scribed for Dr Lady Gary by J.Scotton Electronic Signature(s) Signed: 10/21/2022 12:33:49 PM By: [name] MD FACS Signed: 10/21/2022 4:56:13 PM By: Karie Schwalbe RN Entered By: Karie Schwalbe on 10/21/2022 12:08:36 -------------------------------------------------------------------------------- Debridement Details Patient Name: Date of Service: [name], [name] [name] H. 10/21/2022 11:45 A M Medical Record Number: [id number] Patient Account Number: [id number] Date of Birth/Sex: Treating RN: 07/03/1960 (62 y.o. [name] Primary Care Provider: Nira Conn Other Clinician: Referring Provider: Treating Provider/Extender: Andrey Campanile in Treatment: 38 Debridement Performed for Assessment: Wound #3 Left,Posterior Lower Leg Performed By: Physician [name], MD Debridement Type: Debridement Severity of Tissue Pre Debridement: Fat layer exposed Level of Consciousness (Pre-procedure): Awake and Alert Pre-procedure Verification/Time Out Yes - 11:56 Taken: Start Time: 11:56 Pain Control: Lidocaine 5% topical ointment Percent of Wound Bed Debrided: 100% T Area Debrided (cm): otal 0.71 Tissue and other material debrided: Non-Viable, Slough, Slough Level: Non-Viable Tissue Debridement Description: Selective/Open Wound Instrument: Curette Bleeding: Minimum Hemostasis Achieved: Pressure End Time: 11:58 Procedural Pain: 0 Post Procedural Pain: 0 Response to Treatment: Procedure was tolerated  well Level of Consciousness (Post- Awake and Alert procedure): Post Debridement Measurements of Total Wound Length: (cm) 1 Width: (cm) 0.9 Depth: (cm) 0.1 Volume: (cm) 0.071 Character of Wound/Ulcer Post Debridement: Improved Severity of Tissue Post Debridement: Fat layer exposed Post Procedure Diagnosis Same as Pre-procedure Notes Scribed for Dr Lady Gary by J.Scotton Electronic Signature(s) Signed: 10/21/2022 12:33:49 PM By: [name] MD FACS Signed: 10/21/2022 4:56:13 PM By: Karie Schwalbe RN Entered By: Karie Schwalbe on 10/21/2022 12:09:28 [name] ([id number]) [id number] Page 3 of 14 -------------------------------------------------------------------------------- HPI Details Patient Name: Date of Service: [place], [name] [name] H. 10/21/2022 11:45 A M Medical  Record Number: [id number] Patient Account Number: [id number] Date of Birth/Sex: Treating RN: 07/13/1960 (62 y.o. F) Primary Care Provider: Nira Conn Other Clinician: Referring Provider: Treating Provider/Extender: Andrey Campanile in Treatment: 38 History of Present Illness HPI Description: 02/16/2020 upon evaluation today patient actually appears to be doing poorly in regard to her left medial lower extremity ulcer. This is actually an area that she tells me she has had intermittent issues with over the years although has been closed for some time she typically uses compression right now she has juxta lite compression wraps. With that being said she tells me that this nonetheless open several weeks/months ago and has been given her trouble since. She does have a history of chronic venous insufficiency she is seeing specialist for this in the past she has had an ablation as well as sclerotherapy. With that being said she also has hypertension chronically which is managed by her primary care provider. In general she seems to be worsening overall with regard  to the wound and states that she finally realized that she needed to come in and have somebody look at this and not continue to try to manage this on her own. No fevers, chills, nausea, vomiting, or diarrhea. 02/23/2020 on evaluation today patient appears to be doing well with regard to her wound. This is showing some signs of improvement which is great news still were not quite at the point where I would like to be as far as the overall appearance of the wound is concerned but I do believe this is better than last week. I do believe the Iodoflex is helping as well. 03/08/2020 upon evaluation today patient appears to be doing well with regard to her wound. She has been tolerating the dressing changes without complication. Fortunately I feel like she has made great progress with the Iodoflex but I feel like it may be the point rest to switch to something else possibly a collagen- based dressing at this time. 03/15/2020 upon evaluation today patient appears to be doing excellent in regard to her leg ulcer. She has been tolerating the dressing changes without complication. Fortunately there is no signs of active infection. Overall she is measuring a little bit smaller today which is great news. 03/22/2020 upon evaluation today patient appears to be doing well with regard to her wound. She has been tolerating the dressing changes without complication. Fortunately there is no signs of active infection at this time. 03/28/2020; patient I do not usually see however she has a wound on the left anterior lower leg secondary to chronic venous insufficiency we have been using silver collagen under compression. She arrives in clinic with a nonviable surface requiring debridement 04/12/2020 upon evaluation today patient appears to be doing well all things considered with regard to her leg ulcer. She is tolerating the dressing changes without complication there is minimal dry skin around the edges of the wound that may  be trapping and stopping some of the events of the new skin I am can work on that today. Otherwise the surface of the wound appears to be doing excellent. 04/19/2020 upon evaluation today patient appears to be doing well with regard to her leg ulcer. She has been tolerating dressing changes without complication. Fortunately there is no signs of active infection at this time. No fever chills noted overall very pleased with how things seem to be progressing. 04/26/2020 on evaluation today patient appears to be doing well with regard to her wound currently. Is  showing signs of excellent improvement overall is filling in nicely and there does not appear to be any signs of infection. No fevers, chills, nausea, vomiting, or diarrhea. 05/03/2020 upon evaluation today patient appears to be doing well with regard to her wound on the leg. This overall showing signs of good improvement which is great she has some good epithelial growth and overall I think that things are moving in the correct direction. We likewise going to continue with the wound care measures as before since she seems to making such good improvement. 2/3; venous wound on the left medial leg. This is contracting. We are using Prisma and 3 layer compression. She has a stocking and waiting in the eventuality this heals. She is already using it on the right 05/17/2020 upon evaluation today patient appears to be doing well with regard to her leg ulcer. She has been tolerating the dressing changes without complication. Fortunately there is no signs of active infection which is great news and overall very pleased with where things stand today. No fevers, chills, nausea, vomiting, or diarrhea. 05/24/2020 upon evaluation today patient appears to be doing well with regard to her wound. Overall I feel like she is making excellent progress. There does not appear to be any signs of active infection which is great news. 05/31/2020 upon evaluation today patient  appears to be doing well with regard to her wound. There does not appear to be any signs of active infection which is great news overall I am extremely pleased with where things stand today. READMISSION 01/23/2022 She returns to clinic today with a new wound on her right posterior calf. She says that she was cleaning out an old shed near the middle of August this year and then noticed what seemed to be a bug bite on her right posterior calf. It was itchy, red, and raised. By the end of September, an ulcer had developed. She has been applying various topical creams such as hydrocortisone and others to the site. When it was not improving, she made an appointment in the wound care center. ABI in clinic today was 0.97. On her right posterior calf, there is a circular wound with necrotic fat and black eschar present. There is no purulent drainage or malodor. The periwound skin is in good condition with just a little induration that appears to be secondary to inflammation. 01/31/2022: The wound measures a little bit larger today, but overall is quite a bit cleaner. There is some undermining from 9:00 to 3:00. She is having some periwound itching and says that her wrap slid. 02/08/2022: Despite using an Unna boot first layer at the top of her wrap, it slid again and it looks like there is been some bruising at the wound site. The wound is about the same size in terms of dimensions. There is a fair amount of slough and nonviable tissue still present. Edema control is better than last week. 02/15/2022: The wound dimensions are about the same. There is less nonviable tissue present. The periwound erythema has improved. 02/22/2022: The wound has deteriorated over the past week. It is larger and the periwound is more edematous, erythematous, and indurated. It looks as though there has been more tissue breakdown with undermining present. She is having more pain. There is a foul odor coming from the  wound. 02/26/2022: The wound looks quite a bit better today and the odor is gone. I still do not have her culture data back, but she seems to be responding well  to the LU, PARADISE ([id number]) [id number] Page 4 of 14 Augmentin. 03/06/2022: Her wound continues to improve. There is still some slough accumulation, but the periwound skin is less inflamed. Her culture returned with a polymicrobial population including Pseudomonas and so levofloxacin was also prescribed. She did not understand why a second antibiotic was being added so she has not yet initiated this. 03/14/2022: The wound is about the same, to perhaps a little bit larger. There is a fair amount of slough accumulation on the surface, as well as some hypertrophic granulation tissue. She is still taking levofloxacin, but has completed taking Augmentin. She has her Jodie Echevaria compound with her today. 12/14; the patient has a significant circular wound on the posterior right calf. She is using Keystone and silver alginate under 3 layer compression. Her ABIs were within normal limits at 0.97. This may have been traumatic or an insect bite at the start I reviewed these records. 04/04/2022: Since I last saw the wound, it has contracted considerably. There is a fairly thick layer of slough on the surface, as it has not been debrided since the last time I did it. Edema control is excellent and the periwound skin is in much better condition. 04/12/2022: No significant change in the wound dimensions. It is filling with granulation tissue. Still with slough accumulation on the surface. 04/19/2022: The wound is smaller this week. There is some slough accumulation on the surface. 04/26/2022: The wound is smaller again this week and significantly cleaner. The wound surface is a little bit drier than ideal. 05/03/2022: The wound measurements were about the same, but visually it appears smaller. There is still some undermining at  the top of the wound. Moisture balance is better this week. 05/10/2022: The wound measured smaller today. There is still a fair amount of undermining present. Slough has built up on the surface. We are still awaiting snap VAC approval. 05/17/2022: The wound is a little bit smaller today. There is less slough on the surface, but the granulation tissue still is not very robust. She has been approved for snap VAC but will have a 20% coinsurance and it is not clear what that amount would end up being for her. 05/27/2022: The surface of the wound has deteriorated and it is gray and fibrotic. No significant odor, but the drainage on her dressing was a little bit purulent. 05/31/2022: The wound looks quite a bit better today. It is still a little fibrotic but no longer has purulent-looking drainage. The color is better. She spoke with her insurance company and is interested in trying the snap VAC, now that she is aware of the cost to her. 06/07/2022: After 1 week in the snap VAC, there has been substantial improvement to her wound. The undermined portion is closing in. The surface has a healthier color and appearance. There is very minimal slough accumulation. 06/14/2022: The more shallow part of the undermined portion of her wound has closed. There is still some undermining from about 1-2 o'clock. The surface continues to improve. Minimal slough accumulation. 06/28/2022: The wound is shallower this week and the undermining has essentially closed and completely. The surface tissue is improving. 07/05/2022: The depth is almost immeasurable and the surface is nearly flush with the surrounding skin. The undermining has been eliminated. There is light slough on the wound surface. 07/12/2022: There is a little bit of slough on the wound surface. The depth is about the same as last week. 07/19/2022: The wound is essentially flush with the  surrounding skin surface. There is slight slough present. No real change in the AP or  transverse dimensions. 07/26/2022: The wound measured a little bit smaller today. It is flush with the surrounding skin surface and there is good granulation tissue present. Minimal slough and biofilm accumulation. 08/02/2022: The right posterior leg wound is a little bit smaller. There is a little slough accumulation. She reported a new wound to [place] today. It is on her left posterior calf. It has been present for about 6 weeks. She is not sure of how it occurred. She has been trying to manage it at home with topical Neosporin and Band-Aids. The fat layer is exposed. The surface is fibrotic and covered with slough. 08/09/2022: The right posterior leg wound is smaller again this week. There is good granulation tissue on the surface with minimal slough accumulation. The left posterior calf wound has built up a thick layer of slough. It is still quite fibrotic underneath the slough, but there is a little bit of a pink color beginning to emerge. Edema control is good. 08/16/2022: Both wounds are smaller this week. There is minimal slough on the right posterior leg wound. There is slough that has reaccumulated on the left posterior calf wound but there is a little bit more granulation tissue emerging. 08/23/2022: Both wounds are about the same size this week, but the quality of the tissue and amount of granulation tissue on the left are both better. 08/30/2022: Both wounds measure smaller today, but there has been some tissue breakdown adjacent to the left leg wound. She says it has been itchy. There is a little bit of slough on the surface of both wounds. 09/06/2022: The right wound measures smaller today. There is minimal slough on the wound surface. The left wound is about the same size. The periwound tissue looks better this week. There is still a layer of fibrinous slough on the left wound. Edema control is good bilaterally. 6/7 the patient has wounds on bilateral calfs. We are using silver alginate on the  right Iodoflex on the left under Urgo lite compression. The wounds are measuring smaller. 09/20/2022: The right wound is flush with the surrounding skin. There is good granulation tissue with some biofilm and thin eschar present. On the left, there is some slough accumulation and the patient says it has been a little itchy this week. 09/27/2022: No significant change to the right wound. The left wound measures a little smaller, but still has some depth to it with slough accumulation. 10/04/2022: Nice improvement in both wounds. They are smaller and more superficial. The left wound has better granulation tissue and no longer has the hard fibrous surface. 10/11/2022: Both wounds continue to contract. There is minimal slough and eschar present. Granulation tissue continues to fill in on the left. Edema control is good. 10/21/2022: The right leg wound is about half the size as it was last week. There is a little perimeter eschar and some light slough on the surface. The left leg wound is about the same size, but shallower. There is still slough accumulation but the buds of granulation tissue are getting larger. Edema control remains excellent. Electronic Signature(s) [name], [name] ([id number]) [id number] Page 5 of 14 Signed: 10/21/2022 12:30:16 PM By: [name] MD FACS Entered By: [name] on 10/21/2022 12:30:16 -------------------------------------------------------------------------------- Physical Exam Details Patient Name: Date of Service: [name], [name] [name] H. 10/21/2022 11:45 A M Medical Record Number: [id number] Patient Account Number: [id number] Date of Birth/Sex: Treating RN: 1960/12/21 (  62 y.o. F) Primary Care Provider: Nira Conn Other Clinician: Referring Provider: Treating Provider/Extender: Andrey Campanile in Treatment: 38 Constitutional Hypertensive, asymptomatic. . . . no acute distress. Respiratory Normal  work of breathing on room air. Notes 10/21/2022: The right leg wound is about half the size as it was last week. There is a little perimeter eschar and some light slough on the surface. The left leg wound is about the same size, but shallower. There is still slough accumulation but the buds of granulation tissue are getting larger. Edema control remains excellent. Electronic Signature(s) Signed: 10/21/2022 12:31:08 PM By: [name] MD FACS Entered By: [name] on 10/21/2022 12:31:08 -------------------------------------------------------------------------------- Physician Orders Details Patient Name: Date of Service: [name], [name] [name] H. 10/21/2022 11:45 A M Medical Record Number: [id number] Patient Account Number: [id number] Date of Birth/Sex: Treating RN: [birth year] (62 y.o. 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 - Dr. Lady Gary RM 3 Return A Anesthetic (In clinic) Topical Lidocaine 4% applied to wound bed Bathing/ Shower/ Hygiene May shower with protection but do not get wound dressing(s) wet. Protect dressing(s) with water repellant cover (for example, large plastic bag) or a cast cover and may then take shower. ELDORA, NAPP ([id number]) [id number] Page 6 of 14 Edema Control - Lymphedema / SCD / Other Bilateral Lower Extremities Elevate legs to the level of the heart or above for 30 minutes daily and/or when sitting for 3-4  times a day throughout the day. Avoid standing for long periods of time. Exercise regularly Moisturize legs daily. Wound Treatment Wound #2 - Lower Leg Wound Laterality: Right, Posterior Cleanser: Soap and Water 1 x Per Week/30 Days Discharge Instructions: May shower and wash wound with dial antibacterial soap and water prior to dressing change. Cleanser: Wound Cleanser 1 x Per Week/30 Days Discharge Instructions: Cleanse the wound with wound cleanser prior to applying a clean dressing using gauze sponges, not tissue or cotton balls. Peri-Wound Care: Sween Lotion (Moisturizing lotion) 1 x Per Week/30 Days Discharge Instructions: Apply moisturizing lotion as directed Topical: Gentamicin 1 x Per Week/30 Days Discharge Instructions: As directed by physician Topical: Mupirocin Ointment 1 x Per Week/30 Days Discharge Instructions: Apply Mupirocin (Bactroban) as instructed Prim Dressing: Promogran Prisma Matrix, 4.34 (sq in) (silver collagen) 1 x Per Week/30 Days ary Discharge Instructions: Moisten collagen with saline or hydrogel Secondary Dressing: Woven Gauze Sponge, Non-Sterile 4x4 in 1 x Per Week/30 Days Discharge Instructions: Apply over primary dressing as directed. Compression Wrap: Urgo K2 Lite, (equivalent to a 3 layer) two layer compression system, regular 1 x Per Week/30 Days Discharge Instructions: Apply Urgo K2 Lite as directed (alternative to 3 layer compression). Wound #3 - Lower Leg Wound Laterality: Left, Posterior Cleanser: Soap and Water 1 x Per Week/30 Days Discharge Instructions: May shower and wash wound with dial antibacterial soap and water prior to dressing change. Cleanser: Wound Cleanser 1 x Per Week/30 Days Discharge Instructions: Cleanse the wound with wound cleanser prior to applying a clean dressing using gauze sponges, not tissue or cotton balls. Peri-Wound Care: Sween Lotion (Moisturizing lotion) 1 x Per  Week/30 Days Discharge Instructions: Apply moisturizing  lotion as directed Topical: Gentamicin 1 x Per Week/30 Days Discharge Instructions: As directed by physician Topical: Mupirocin Ointment 1 x Per Week/30 Days Discharge Instructions: Apply Mupirocin (Bactroban) as instructed Prim Dressing: Promogran Prisma Matrix, 4.34 (sq in) (silver collagen) 1 x Per Week/30 Days ary Discharge Instructions: Moisten collagen with saline or hydrogel Secondary Dressing: Woven Gauze Sponge, Non-Sterile 4x4 in 1 x Per Week/30 Days Discharge Instructions: Apply over primary dressing as directed. Compression Wrap: Urgo K2 Lite, (equivalent to a 3 layer) two layer compression system, regular 1 x Per Week/30 Days Discharge Instructions: Apply Urgo K2 Lite as directed (alternative to 3 layer compression). Electronic Signature(s) Signed: 10/21/2022 12:33:49 PM By: [name] MD FACS Entered By: [name] on 10/21/2022 12:31:25 -------------------------------------------------------------------------------- Problem List Details Patient Name: Date of Service: [name], [name] [name] H. 10/21/2022 11:45 A M Medical Record Number: [id number] Patient Account Number: [id number] Date of Birth/Sex: Treating RN: 1960-12-19 (62 y.o. Dessiree [name], [name] ([id number]) [id number] Page 7 of 14 Primary Care Provider: Nira Conn Other Clinician: Referring Provider: Treating Provider/Extender: Andrey Campanile in Treatment: 38 Active Problems ICD-10 Encounter Code Description Active Date MDM Diagnosis L97.212 Non-pressure chronic ulcer of right calf with fat layer exposed 01/23/2022 No Yes L97.222 Non-pressure chronic ulcer of left calf with fat layer exposed 08/02/2022 No Yes E66.01 Morbid (severe) obesity due to excess calories 01/23/2022 No Yes I10 Essential (primary) hypertension 01/23/2022 No Yes R60.0 Localized edema 01/23/2022 No Yes Inactive Problems Resolved Problems Electronic Signature(s) Signed:  10/21/2022 12:28:53 PM By: [name] MD FACS Entered By: [name] on 10/21/2022 12:28:53 -------------------------------------------------------------------------------- Progress Note Details Patient Name: Date of Service: [name], [name] [name] H. 10/21/2022 11:45 A M Medical Record Number: [id number] Patient Account Number: [id number] Date of Birth/Sex: Treating RN: 1960-12-10 (62 y.o. F) Primary Care Provider: Nira Conn Other Clinician: Referring Provider: Treating Provider/Extender: Andrey Campanile in Treatment: 38 Subjective Chief Complaint Information obtained from Patient RLE ulcer History of Present Illness (HPI) 02/16/2020 upon evaluation today patient actually appears to be doing poorly in regard to her left medial lower extremity ulcer. This is actually an area that she tells me she has had intermittent issues with over the years although has been closed for some time she typically uses compression right now she has juxta lite compression wraps. With that being said she tells me that this nonetheless open several weeks/months ago and has been given her trouble since. She does have a history of chronic venous insufficiency she is seeing specialist for this in the past she has had an ablation as well as sclerotherapy. With that being said she also has hypertension chronically which is managed by her primary care provider. In general she seems to be worsening overall with regard to the wound and states that she finally realized that she needed to come in and have somebody look at this and not continue to try to manage this on her own. No fevers, chills, nausea, vomiting, or diarrhea. 02/23/2020 on evaluation today patient appears to be doing well with regard to her wound. This is showing some signs of improvement which is great news still were not quite at the point where I would like to be as far as the overall appearance of the wound is  concerned but I do believe this is better than last week. I do believe the Iodoflex is helping as well. 03/08/2020 upon evaluation today patient appears to be doing  well with regard to her wound. She has been tolerating the dressing changes without complication. Fortunately I feel like she has made great progress with the Iodoflex but I feel like it may be the point rest to switch to something else possibly a collagenMARKEESHA, [name] ([id number]) [id number] Page 8 of 14 based dressing at this time. 03/15/2020 upon evaluation today patient appears to be doing excellent in regard to her leg ulcer. She has been tolerating the dressing changes without complication. Fortunately there is no signs of active infection. Overall she is measuring a little bit smaller today which is great news. 03/22/2020 upon evaluation today patient appears to be doing well with regard to her wound. She has been tolerating the dressing changes without complication. Fortunately there is no signs of active infection at this time. 03/28/2020; patient I do not usually see however she has a wound on the left anterior lower leg secondary to chronic venous insufficiency we have been using silver collagen under compression. She arrives in clinic with a nonviable surface requiring debridement 04/12/2020 upon evaluation today patient appears to be doing well all things considered with regard to her leg ulcer. She is tolerating the dressing changes without complication there is minimal dry skin around the edges of the wound that may be trapping and stopping some of the events of the new skin I am can work on that today. Otherwise the surface of the wound appears to be doing excellent. 04/19/2020 upon evaluation today patient appears to be doing well with regard to her leg ulcer. She has been tolerating dressing changes without complication. Fortunately there is no signs of active infection at this time. No fever  chills noted overall very pleased with how things seem to be progressing. 04/26/2020 on evaluation today patient appears to be doing well with regard to her wound currently. Is showing signs of excellent improvement overall is filling in nicely and there does not appear to be any signs of infection. No fevers, chills, nausea, vomiting, or diarrhea. 05/03/2020 upon evaluation today patient appears to be doing well with regard to her wound on the leg. This overall showing signs of good improvement which is great she has some good epithelial growth and overall I think that things are moving in the correct direction. We likewise going to continue with the wound care measures as before since she seems to making such good improvement. 2/3; venous wound on the left medial leg. This is contracting. We are using Prisma and 3 layer compression. She has a stocking and waiting in the eventuality this heals. She is already using it on the right 05/17/2020 upon evaluation today patient appears to be doing well with regard to her leg ulcer. She has been tolerating the dressing changes without complication. Fortunately there is no signs of active infection which is great news and overall very pleased with where things stand today. No fevers, chills, nausea, vomiting, or diarrhea. 05/24/2020 upon evaluation today patient appears to be doing well with regard to her wound. Overall I feel like she is making excellent progress. There does not appear to be any signs of active infection which is great news. 05/31/2020 upon evaluation today patient appears to be doing well with regard to her wound. There does not appear to be any signs of active infection which is great news overall I am extremely pleased with where things stand today. READMISSION 01/23/2022 She returns to clinic today with a new wound on her right posterior calf. She says  that she was cleaning out an old shed near the middle of August this year and then  noticed what seemed to be a bug bite on her right posterior calf. It was itchy, red, and raised. By the end of September, an ulcer had developed. She has been applying various topical creams such as hydrocortisone and others to the site. When it was not improving, she made an appointment in the wound care center. ABI in clinic today was 0.97. On her right posterior calf, there is a circular wound with necrotic fat and black eschar present. There is no purulent drainage or malodor. The periwound skin is in good condition with just a little induration that appears to be secondary to inflammation. 01/31/2022: The wound measures a little bit larger today, but overall is quite a bit cleaner. There is some undermining from 9:00 to 3:00. She is having some periwound itching and says that her wrap slid. 02/08/2022: Despite using an Unna boot first layer at the top of her wrap, it slid again and it looks like there is been some bruising at the wound site. The wound is about the same size in terms of dimensions. There is a fair amount of slough and nonviable tissue still present. Edema control is better than last week. 02/15/2022: The wound dimensions are about the same. There is less nonviable tissue present. The periwound erythema has improved. 02/22/2022: The wound has deteriorated over the past week. It is larger and the periwound is more edematous, erythematous, and indurated. It looks as though there has been more tissue breakdown with undermining present. She is having more pain. There is a foul odor coming from the wound. 02/26/2022: The wound looks quite a bit better today and the odor is gone. I still do not have her culture data back, but she seems to be responding well to the Augmentin. 03/06/2022: Her wound continues to improve. There is still some slough accumulation, but the periwound skin is less inflamed. Her culture returned with a polymicrobial population including Pseudomonas and so  levofloxacin was also prescribed. She did not understand why a second antibiotic was being added so she has not yet initiated this. 03/14/2022: The wound is about the same, to perhaps a little bit larger. There is a fair amount of slough accumulation on the surface, as well as some hypertrophic granulation tissue. She is still taking levofloxacin, but has completed taking Augmentin. She has her Jodie Echevaria compound with her today. 12/14; the patient has a significant circular wound on the posterior right calf. She is using Keystone and silver alginate under 3 layer compression. Her ABIs were within normal limits at 0.97. This may have been traumatic or an insect bite at the start I reviewed these records. 04/04/2022: Since I last saw the wound, it has contracted considerably. There is a fairly thick layer of slough on the surface, as it has not been debrided since the last time I did it. Edema control is excellent and the periwound skin is in much better condition. 04/12/2022: No significant change in the wound dimensions. It is filling with granulation tissue. Still with slough accumulation on the surface. 04/19/2022: The wound is smaller this week. There is some slough accumulation on the surface. 04/26/2022: The wound is smaller again this week and significantly cleaner. The wound surface is a little bit drier than ideal. 05/03/2022: The wound measurements were about the same, but visually it appears smaller. There is still some undermining at the top of the wound. Moisture  balance is better this week. 05/10/2022: The wound measured smaller today. There is still a fair amount of undermining present. Slough has built up on the surface. We are still awaiting snap VAC approval. 05/17/2022: The wound is a little bit smaller today. There is less slough on the surface, but the granulation tissue still is not very robust. She has been approved for snap VAC but will have a 20% coinsurance and it is not clear what that  amount would end up being for her. 05/27/2022: The surface of the wound has deteriorated and it is gray and fibrotic. No significant odor, but the drainage on her dressing was a little bit purulent. 05/31/2022: The wound looks quite a bit better today. It is still a little fibrotic but no longer has purulent-looking drainage. The color is better. She spoke with her insurance company and is interested in trying the snap VAC, now that she is aware of the cost to her. 06/07/2022: After 1 week in the snap VAC, there has been substantial improvement to her wound. The undermined portion is closing in. The surface has a [name], [name] ([id number]) [id number] Page 9 of 14 healthier color and appearance. There is very minimal slough accumulation. 06/14/2022: The more shallow part of the undermined portion of her wound has closed. There is still some undermining from about 1-2 o'clock. The surface continues to improve. Minimal slough accumulation. 06/28/2022: The wound is shallower this week and the undermining has essentially closed and completely. The surface tissue is improving. 07/05/2022: The depth is almost immeasurable and the surface is nearly flush with the surrounding skin. The undermining has been eliminated. There is light slough on the wound surface. 07/12/2022: There is a little bit of slough on the wound surface. The depth is about the same as last week. 07/19/2022: The wound is essentially flush with the surrounding skin surface. There is slight slough present. No real change in the AP or transverse dimensions. 07/26/2022: The wound measured a little bit smaller today. It is flush with the surrounding skin surface and there is good granulation tissue present. Minimal slough and biofilm accumulation. 08/02/2022: The right posterior leg wound is a little bit smaller. There is a little slough accumulation. She reported a new wound to [place] today. It is on her left posterior calf.  It has been present for about 6 weeks. She is not sure of how it occurred. She has been trying to manage it at home with topical Neosporin and Band-Aids. The fat layer is exposed. The surface is fibrotic and covered with slough. 08/09/2022: The right posterior leg wound is smaller again this week. There is good granulation tissue on the surface with minimal slough accumulation. The left posterior calf wound has built up a thick layer of slough. It is still quite fibrotic underneath the slough, but there is a little bit of a pink color beginning to emerge. Edema control is good. 08/16/2022: Both wounds are smaller this week. There is minimal slough on the right posterior leg wound. There is slough that has reaccumulated on the left posterior calf wound but there is a little bit more granulation tissue emerging. 08/23/2022: Both wounds are about the same size this week, but the quality of the tissue and amount of granulation tissue on the left are both better. 08/30/2022: Both wounds measure smaller today, but there has been some tissue breakdown adjacent to the left leg wound. She says it has been itchy. There is a little bit of slough on  the surface of both wounds. 09/06/2022: The right wound measures smaller today. There is minimal slough on the wound surface. The left wound is about the same size. The periwound tissue looks better this week. There is still a layer of fibrinous slough on the left wound. Edema control is good bilaterally. 6/7 the patient has wounds on bilateral calfs. We are using silver alginate on the right Iodoflex on the left under Urgo lite compression. The wounds are measuring smaller. 09/20/2022: The right wound is flush with the surrounding skin. There is good granulation tissue with some biofilm and thin eschar present. On the left, there is some slough accumulation and the patient says it has been a little itchy this week. 09/27/2022: No significant change to the right wound. The  left wound measures a little smaller, but still has some depth to it with slough accumulation. 10/04/2022: Nice improvement in both wounds. They are smaller and more superficial. The left wound has better granulation tissue and no longer has the hard fibrous surface. 10/11/2022: Both wounds continue to contract. There is minimal slough and eschar present. Granulation tissue continues to fill in on the left. Edema control is good. 10/21/2022: The right leg wound is about half the size as it was last week. There is a little perimeter eschar and some light slough on the surface. The left leg wound is about the same size, but shallower. There is still slough accumulation but the buds of granulation tissue are getting larger. Edema control remains excellent. Patient History Information obtained from Patient. Family History Cancer - Mother, Diabetes - Father, Hypertension - Mother, Kidney Disease - Mother, Lung Disease - Father, Thyroid Problems - Paternal Grandparents, No family history of Heart Disease, Seizures, Stroke, Tuberculosis. Social History Never smoker, Marital Status - Married, Alcohol Use - Never, Drug Use - No History, Caffeine Use - Daily - coffee. Medical History Eyes Denies history of Cataracts, Glaucoma, Optic Neuritis Ear/Nose/Mouth/Throat Denies history of Chronic sinus problems/congestion, Middle ear problems Hematologic/Lymphatic Patient has history of Anemia - iron Denies history of Hemophilia, Human Immunodeficiency Virus, Lymphedema, Sickle Cell Disease Respiratory Patient has history of Sleep Apnea - CPAP Denies history of Aspiration, Asthma, Chronic Obstructive Pulmonary Disease (COPD), Pneumothorax, Tuberculosis Cardiovascular Patient has history of Hypertension, Peripheral Venous Disease Denies history of Angina, Arrhythmia, Congestive Heart Failure, Coronary Artery Disease, Deep Vein Thrombosis, Hypotension, Myocardial Infarction, Peripheral Arterial Disease,  Phlebitis, Vasculitis Gastrointestinal Denies history of Cirrhosis , Colitis, Crohns, Hepatitis A, Hepatitis B, Hepatitis C Endocrine Denies history of Type I Diabetes, Type II Diabetes Genitourinary Denies history of End Stage Renal Disease Immunological Denies history of Lupus Erythematosus, Raynauds, Scleroderma Integumentary (Skin) Denies history of History of Burn Musculoskeletal Denies history of Gout, Rheumatoid Arthritis, Osteoarthritis, Osteomyelitis [name], [name] ([id number]) [id number] Page 10 of 14 Neurologic Denies history of Dementia, Neuropathy, Quadriplegia, Paraplegia, Seizure Disorder Oncologic Denies history of Received Chemotherapy, Received Radiation Psychiatric Denies history of Anorexia/bulimia, Confinement Anxiety Hospitalization/Surgery History - cholecystectomy 1980s. - nephrolithasis 1980s. - plate and rod in right elbow surgery 2010. 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 no acute distress. Vitals Time Taken: 11:46 AM, Height: 61 in, Weight: 277 lbs, BMI: 52.3, Temperature: 98.2 F, Pulse: 87 bpm, Respiratory Rate: 18 breaths/min, Blood Pressure: 154/67 mmHg. Respiratory Normal work of breathing on room air. General Notes: 10/21/2022: The right leg wound is about half the size as it was last week. There  is a little perimeter eschar and some light slough on the surface. The left leg wound is about the same size, but shallower. There is still slough accumulation but the buds of granulation tissue are getting larger. Edema control remains excellent. Integumentary (Hair, Skin) Wound #2 status is Open. Original cause of wound was Insect Bite. The date acquired was: 11/21/2021. The wound has been in treatment 38 weeks. The wound is located on the Right,Posterior Lower Leg. The wound measures 1cm  length x 0.7cm width x 0.1cm depth; 0.55cm^2 area and 0.055cm^3 volume. There is Fat Layer (Subcutaneous Tissue) exposed. There is no tunneling or undermining noted. There is a medium amount of serosanguineous drainage noted. The wound margin is flat and intact. There is medium (34-66%) red granulation within the wound bed. There is a medium (34-66%) amount of necrotic tissue within the wound bed including Adherent Slough. The periwound skin appearance had no abnormalities noted for texture. The periwound skin appearance had no abnormalities noted for moisture. The periwound skin appearance exhibited: Hemosiderin Staining. The periwound skin appearance did not exhibit: Atrophie Blanche, Ecchymosis, Rubor, Erythema. Periwound temperature was noted as No Abnormality. The periwound has tenderness on palpation. Wound #3 status is Open. Original cause of wound was Trauma. The date acquired was: 06/28/2022. The wound has been in treatment 11 weeks. The wound is located on the Left,Posterior Lower Leg. The wound measures 1cm length x 0.9cm width x 0.1cm depth; 0.707cm^2 area and 0.071cm^3 volume. There is Fat Layer (Subcutaneous Tissue) exposed. There is no tunneling or undermining noted. There is a medium amount of serosanguineous drainage noted. The wound margin is flat and intact. There is medium (34-66%) red granulation within the wound bed. There is a medium (34-66%) amount of necrotic tissue within the wound bed including Adherent Slough. The periwound skin appearance had no abnormalities noted for texture. The periwound skin appearance had no abnormalities noted for moisture. The periwound skin appearance had no abnormalities noted for color. Periwound temperature was noted as No Abnormality. The periwound has tenderness on palpation. 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 There was a Selective/Open Wound Non-Viable Tissue Debridement with a total area of 0.55 sq cm performed by [name], MD. With the following instrument(s): Curette to remove Non-Viable tissue/material. Material removed includes Eschar and Slough and after achieving pain control using Lidocaine 5% topical ointment. No specimens were taken. A time out was conducted at 11:56, prior to the start of the procedure. A Minimum amount of bleeding was controlled with Pressure. The procedure was tolerated well with a pain level of 0 throughout and a pain level of 0 following the procedure. Post Debridement Measurements: 1cm length x 0.7cm width x 0.1cm depth; 0.055cm^3 volume. Character of Wound/Ulcer Post Debridement is improved. Severity of Tissue Post Debridement is: Fat layer exposed. LYSSA, HACKLEY ([id number]) [id number] Page 11 of 14 Post procedure Diagnosis Wound #2: Same as Pre-Procedure General Notes: Scribed for Dr Lady Gary by J.Scotton. Pre-procedure diagnosis of Wound #2 is a Venous Leg Ulcer located on the Right,Posterior Lower Leg . There was a Three Layer Compression Therapy Procedure by Karie Schwalbe, RN. Post procedure Diagnosis Wound #2: Same as  Pre-Procedure Wound #3 Pre-procedure diagnosis of Wound #3 is a Venous Leg Ulcer located on the Left,Posterior Lower Leg .Severity of Tissue Pre Debridement is: Fat layer exposed. There was a Selective/Open Wound Non-Viable Tissue Debridement with a total area of 0.71 sq cm performed by [name], MD. With the following instrument(s): Curette to remove Non-Viable tissue/material. Material removed includes [facility] after achieving pain  control using Lidocaine 5% topical ointment. No specimens were taken. A time out was conducted at 11:56, prior to the start of the procedure. A Minimum amount of bleeding was controlled with Pressure. The procedure was tolerated well with a pain level of 0 throughout and a pain level of 0 following the procedure. Post Debridement Measurements: 1cm length x 0.9cm width x 0.1cm depth; 0.071cm^3 volume. Character of Wound/Ulcer Post Debridement is improved. Severity of Tissue Post Debridement is: Fat layer exposed. Post procedure Diagnosis Wound #3: Same as Pre-Procedure General Notes: Scribed for Dr Lady Gary by J.Scotton. Pre-procedure diagnosis of Wound #3 is a Venous Leg Ulcer located on the Left,Posterior Lower Leg . There was a Three Layer Compression Therapy Procedure by Karie Schwalbe, RN. Post procedure Diagnosis Wound #3: Same as Pre-Procedure Plan Follow-up Appointments: Return Appointment in 1 week. - Dr. Lady Gary RM 3 Anesthetic: (In clinic) Topical Lidocaine 4% applied to wound bed Bathing/ Shower/ Hygiene: May shower with protection but do not get wound dressing(s) wet. Protect dressing(s) with water repellant cover (for example, large plastic bag) or a cast cover and may then take shower. Edema Control - Lymphedema / SCD / Other: Elevate legs to the level of the heart or above for 30 minutes daily and/or when sitting for 3-4 times a day throughout the day. Avoid standing for long periods of time. Exercise regularly Moisturize legs daily. WOUND #2: - Lower Leg Wound Laterality: Right, Posterior Cleanser: Soap and Water 1 x Per Week/30 Days Discharge Instructions: May shower and wash wound with dial antibacterial soap and water prior to dressing change. Cleanser: Wound Cleanser 1 x Per Week/30 Days Discharge Instructions: Cleanse the wound with wound cleanser prior to applying a clean dressing using gauze sponges, not tissue or cotton balls. Peri-Wound Care: Sween Lotion  (Moisturizing lotion) 1 x Per Week/30 Days Discharge Instructions: Apply moisturizing lotion as directed Topical: Gentamicin 1 x Per Week/30 Days Discharge Instructions: As directed by physician Topical: Mupirocin Ointment 1 x Per Week/30 Days Discharge Instructions: Apply Mupirocin (Bactroban) as instructed Prim Dressing: Promogran Prisma Matrix, 4.34 (sq in) (silver collagen) 1 x Per Week/30 Days ary Discharge Instructions: Moisten collagen with saline or hydrogel Secondary Dressing: Woven Gauze Sponge, Non-Sterile 4x4 in 1 x Per Week/30 Days Discharge Instructions: Apply over primary dressing as directed. Com pression Wrap: Urgo K2 Lite, (equivalent to a 3 layer) two layer compression system, regular 1 x Per Week/30 Days Discharge Instructions: Apply Urgo K2 Lite as directed (alternative to 3 layer compression). WOUND #3: - Lower Leg Wound Laterality: Left, Posterior Cleanser: Soap and Water 1 x Per Week/30 Days Discharge Instructions: May shower and wash wound with dial antibacterial soap and water prior to dressing change. Cleanser: Wound Cleanser 1 x Per Week/30 Days Discharge Instructions: Cleanse the wound with wound cleanser prior to applying a clean dressing using gauze sponges, not tissue or cotton balls. Peri-Wound Care: Sween Lotion (Moisturizing lotion) 1 x Per Week/30 Days Discharge Instructions: Apply moisturizing lotion as directed Topical: Gentamicin 1 x Per Week/30 Days Discharge Instructions: As directed by physician Topical: Mupirocin Ointment 1 x Per Week/30 Days Discharge  Instructions: Apply Mupirocin (Bactroban) as instructed Prim Dressing: Promogran Prisma Matrix, 4.34 (sq in) (silver collagen) 1 x Per Week/30 Days ary Discharge Instructions: Moisten collagen with saline or hydrogel Secondary Dressing: Woven Gauze Sponge, Non-Sterile 4x4 in 1 x Per Week/30 Days Discharge Instructions: Apply over primary dressing as directed. Com pression Wrap: Urgo K2 Lite,  (equivalent to a 3 layer) two layer compression system, regular 1 x Per Week/30 Days Discharge Instructions: Apply Urgo K2 Lite as directed (alternative to 3 layer compression). 10/21/2022: The right leg wound is about half the size as it was last week. There is a little perimeter eschar and some light slough on the surface. The left leg wound is about the same size, but shallower. There is still slough accumulation but the buds of granulation tissue are getting larger. Edema control remains excellent. I used a curette to debride slough and eschar from the right leg wound and slough from the left leg wound. We will continue the mixture of topical gentamicin and mupirocin with Prisma silver collagen to both wounds. Continue bilateral Urgo lite compression. Follow-up in 1 week. [name], [name] ([id number]) [id number] Page 12 of 14 Electronic Signature(s) Signed: 10/21/2022 12:32:21 PM By: [name] MD FACS Entered By: [name] on 10/21/2022 12:32:21 -------------------------------------------------------------------------------- HxROS Details Patient Name: Date of Service: [name], [name] [name] H. 10/21/2022 11:45 A M Medical Record Number: [id number] Patient Account Number: [id number] Date of Birth/Sex: Treating RN: 17-Aug-1960 (62 y.o. F) Primary Care Provider: Nira Conn Other Clinician: Referring Provider: Treating Provider/Extender: Andrey Campanile in Treatment: 38 Information Obtained From Patient Eyes Medical History: Negative for: Cataracts; Glaucoma; Optic Neuritis Ear/Nose/Mouth/Throat Medical History: Negative for: Chronic sinus problems/congestion; Middle ear problems Hematologic/Lymphatic Medical History: Positive for: Anemia - iron Negative for: Hemophilia; Human Immunodeficiency Virus; Lymphedema; Sickle Cell Disease Respiratory Medical History: Positive for: Sleep Apnea - CPAP Negative for:  Aspiration; Asthma; Chronic Obstructive Pulmonary Disease (COPD); Pneumothorax; Tuberculosis Cardiovascular Medical History: Positive for: Hypertension; Peripheral Venous Disease Negative for: Angina; Arrhythmia; Congestive Heart Failure; Coronary Artery Disease; Deep Vein Thrombosis; Hypotension; Myocardial Infarction; Peripheral Arterial Disease; Phlebitis; Vasculitis Gastrointestinal Medical History: Negative for: Cirrhosis ; Colitis; Crohns; Hepatitis A; Hepatitis B; Hepatitis C Endocrine Medical History: Negative for: Type I Diabetes; Type II Diabetes Genitourinary Medical History: Negative for: End Stage Renal Disease Past Medical History Notes: years ago kidney stones Immunological Medical History: Negative for: Lupus Erythematosus; Raynauds; Scleroderma Integumentary (Skin) Medical History: [name], [name] ([id number]) [id number] Page 13 of 14 Negative for: History of Burn Musculoskeletal Medical History: Negative for: Gout; Rheumatoid Arthritis; Osteoarthritis; Osteomyelitis Neurologic Medical History: Negative for: Dementia; Neuropathy; Quadriplegia; Paraplegia; Seizure Disorder Oncologic Medical History: Negative for: Received Chemotherapy; Received Radiation Past Medical History Notes: skin Ca removed from back years ago Psychiatric Medical History: Negative for: Anorexia/bulimia; Confinement Anxiety Immunizations Pneumococcal Vaccine: Received Pneumococcal Vaccination: No Implantable Devices None Hospitalization / Surgery History Type of Hospitalization/Surgery cholecystectomy 1980s nephrolithasis 1980s plate and rod in right elbow surgery 2010 Family and Social History Cancer: Yes - Mother; Diabetes: Yes - Father; Heart Disease: No; Hypertension: Yes - Mother; Kidney Disease: Yes - Mother; Lung Disease: Yes - Father; Seizures: No; Stroke: No; Thyroid Problems: Yes - Paternal Grandparents; Tuberculosis: No; Never smoker;  Marital Status - Married; Alcohol Use: Never; Drug Use: No History; Caffeine Use: Daily - coffee; Financial Concerns: No; Food, Clothing or Shelter Needs: No; Support System Lacking: No; Transportation Concerns: No Electronic Signature(s) Signed: 10/21/2022 12:33:49 PM By: [name] MD FACS Entered By:  [name] on 10/21/2022 12:30:35 -------------------------------------------------------------------------------- SuperBill Details Patient Name: Date of Service: [place], [name] 10/21/2022 Medical Record Number: [id number] Patient Account Number: [id number] Date of Birth/Sex: Treating RN: 1960/12/23 (62 y.o. 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 E66.01 Morbid (severe) obesity due to excess calories I10 Essential (primary) hypertension KEIRA, BOHLIN ([id number]) [id number] Page 14 of 14 R60.0 Localized edema Facility Procedures : CPT4 Code: [id number] Description: 97597 - DEBRIDE WOUND 1ST 20 SQ CM OR < ICD-10 Diagnosis Description 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 Modifier: Quantity: 1 Physician Procedures : CPT4 Code Description Modifier [id number] - WC PHYS LEVEL 4 - EST PT 25 ICD-10 Diagnosis Description 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

## 2022-10-30 ENCOUNTER — Encounter (HOSPITAL_BASED_OUTPATIENT_CLINIC_OR_DEPARTMENT_OTHER): Payer: BC Managed Care – PPO | Admitting: General Surgery

## 2022-10-30 DIAGNOSIS — L97212 Non-pressure chronic ulcer of right calf with fat layer exposed: Secondary | ICD-10-CM | POA: Diagnosis not present

## 2022-10-30 NOTE — Progress Notes (Signed)
[name], [name] ([id number]) [id number] Page 1 of 12 Visit Report for 10/30/2022 Chief Complaint Document Details Patient Name: Date of Service: [name], [name]. 10/30/2022 11:15 A M Medical Record Number: [id number] Patient Account Number: [id number] Date of Birth/Sex: Treating RN: Aug 18, 1960 (62 y.o. F) Primary Care Provider: Nira Conn Other Clinician: Referring Provider: Treating Provider/Extender: Andrey Campanile in Treatment: 40 Information Obtained from: Patient Chief Complaint RLE ulcer Electronic Signature(s) Signed: 10/30/2022 11:43:26 AM By: [name] MD FACS Entered By: [name] on 10/30/2022 11:43:26 -------------------------------------------------------------------------------- Debridement Details Patient Name: Date of Service: [name], [name] 10/30/2022 11:15 A M Medical Record Number: [id number] Patient Account Number: [id number] Date of Birth/Sex: Treating RN: [birth year] (62 y.o. [name], [name] Primary Care Provider: Nira Conn Other Clinician: Referring Provider: Treating Provider/Extender: Andrey Campanile in Treatment: 40 Debridement Performed for Assessment: Wound #3 Left,Posterior Lower Leg Performed By: Physician [name], MD Debridement Type: Debridement Severity of Tissue Pre Debridement: Fat layer exposed Level of Consciousness (Pre-procedure): Awake and Alert Pre-procedure Verification/Time Out Yes - 11:40 Taken: Start Time: 11:40 Pain Control: Lidocaine 4% T opical Solution Percent of Wound Bed Debrided: 100% T Area Debrided (cm): otal 0.42 Tissue and other material debrided: Non-Viable, Slough, Slough Level: Non-Viable Tissue Debridement Description: Selective/Open Wound Instrument: Curette Bleeding: Minimum Hemostasis Achieved: Pressure Procedural Pain: 0 Post Procedural Pain: 0 Response to Treatment: Procedure was  tolerated well Level of Consciousness (Post- Awake and Alert procedure): Post Debridement Measurements of Total Wound Length: (cm) 0.9 Width: (cm) 0.6 Depth: (cm) 0.1 Volume: (cm) 0.042 Character of Wound/Ulcer Post Debridement: Improved Severity of Tissue Post Debridement: Fat layer exposed TAYSIA, RIVERE ([id number]) [id number] Page 2 of 12 Post Procedure Diagnosis Same as Pre-procedure Notes Scribed for Dr. Lady Gary by [name], RN Electronic Signature(s) Signed: 10/30/2022 11:48:04 AM By: [name] MD FACS Signed: 10/30/2022 5:09:00 PM By: [name] RN, BSN Entered By: [name] on 10/30/2022 11:41:34 -------------------------------------------------------------------------------- HPI Details Patient Name: Date of Service: [name], [name] 10/30/2022 11:15 A M Medical Record Number: [id number] Patient Account Number: [id number] Date of Birth/Sex: Treating RN: 04-03-1961 (62 y.o. F) Primary Care Provider: Nira Conn Other Clinician: Referring Provider: Treating Provider/Extender: Andrey Campanile in Treatment: 40 History of Present Illness HPI Description: 02/16/2020 upon evaluation today patient actually appears to be doing poorly in regard to her left medial lower extremity ulcer. This is actually an area that she tells me she has had intermittent issues with over the years although has been closed for some time she typically uses compression right now she has juxta lite compression wraps. With that being said she tells me that this nonetheless open several weeks/months ago and has been given her trouble since. She does have a history of chronic venous insufficiency she is seeing specialist for this in the past she has had an ablation as well as sclerotherapy. With that being said she also has hypertension chronically which is managed by her primary care provider. In general she seems to  be worsening overall with regard to the wound and states that she finally realized that she needed to come in and have somebody look at this and not continue to try to manage this on her own. No fevers, chills, nausea, vomiting, or diarrhea. 02/23/2020 on evaluation today patient appears to be doing well with regard to her wound. This is showing some signs of improvement which is great news still were not quite at  the point where I would like to be as far as the overall appearance of the wound is concerned but I do believe this is better than last week. I do believe the Iodoflex is helping as well. 03/08/2020 upon evaluation today patient appears to be doing well with regard to her wound. She has been tolerating the dressing changes without complication. Fortunately I feel like she has made great progress with the Iodoflex but I feel like it may be the point rest to switch to something else possibly a collagen- based dressing at this time. 03/15/2020 upon evaluation today patient appears to be doing excellent in regard to her leg ulcer. She has been tolerating the dressing changes without complication. Fortunately there is no signs of active infection. Overall she is measuring a little bit smaller today which is great news. 03/22/2020 upon evaluation today patient appears to be doing well with regard to her wound. She has been tolerating the dressing changes without complication. Fortunately there is no signs of active infection at this time. 03/28/2020; patient I do not usually see however she has a wound on the left anterior lower leg secondary to chronic venous insufficiency we have been using silver collagen under compression. She arrives in clinic with a nonviable surface requiring debridement 04/12/2020 upon evaluation today patient appears to be doing well all things considered with regard to her leg ulcer. She is tolerating the dressing changes without complication there is minimal dry skin  around the edges of the wound that may be trapping and stopping some of the events of the new skin I am can work on that today. Otherwise the surface of the wound appears to be doing excellent. 04/19/2020 upon evaluation today patient appears to be doing well with regard to her leg ulcer. She has been tolerating dressing changes without complication. Fortunately there is no signs of active infection at this time. No fever chills noted overall very pleased with how things seem to be progressing. 04/26/2020 on evaluation today patient appears to be doing well with regard to her wound currently. Is showing signs of excellent improvement overall is filling in nicely and there does not appear to be any signs of infection. No fevers, chills, nausea, vomiting, or diarrhea. 05/03/2020 upon evaluation today patient appears to be doing well with regard to her wound on the leg. This overall showing signs of good improvement which is great she has some good epithelial growth and overall I think that things are moving in the correct direction. We likewise going to continue with the wound care measures as before since she seems to making such good improvement. 2/3; venous wound on the left medial leg. This is contracting. We are using Prisma and 3 layer compression. She has a stocking and waiting in the eventuality this heals. She is already using it on the right 05/17/2020 upon evaluation today patient appears to be doing well with regard to her leg ulcer. She has been tolerating the dressing changes without complication. Fortunately there is no signs of active infection which is great news and overall very pleased with where things stand today. No fevers, chills, nausea, vomiting, or diarrhea. 05/24/2020 upon evaluation today patient appears to be doing well with regard to her wound. Overall I feel like she is making excellent progress. There does not appear to be any signs of active infection which is great  news. 05/31/2020 upon evaluation today patient appears to be doing well with regard to her wound. There does not appear  to be any signs of active infection which is great news overall I am extremely pleased with where things stand today. READMISSION 01/23/2022 SHANIEKA, BLEA ([id number]) [id number] Page 3 of 12 She returns to clinic today with a new wound on her right posterior calf. She says that she was cleaning out an old shed near the middle of August this year and then noticed what seemed to be a bug bite on her right posterior calf. It was itchy, red, and raised. By the end of September, an ulcer had developed. She has been applying various topical creams such as hydrocortisone and others to the site. When it was not improving, she made an appointment in the wound care center. ABI in clinic today was 0.97. On her right posterior calf, there is a circular wound with necrotic fat and black eschar present. There is no purulent drainage or malodor. The periwound skin is in good condition with just a little induration that appears to be secondary to inflammation. 01/31/2022: The wound measures a little bit larger today, but overall is quite a bit cleaner. There is some undermining from 9:00 to 3:00. She is having some periwound itching and says that her wrap slid. 02/08/2022: Despite using an Unna boot first layer at the top of her wrap, it slid again and it looks like there is been some bruising at the wound site. The wound is about the same size in terms of dimensions. There is a fair amount of slough and nonviable tissue still present. Edema control is better than last week. 02/15/2022: The wound dimensions are about the same. There is less nonviable tissue present. The periwound erythema has improved. 02/22/2022: The wound has deteriorated over the past week. It is larger and the periwound is more edematous, erythematous, and indurated. It looks as though there has  been more tissue breakdown with undermining present. She is having more pain. There is a foul odor coming from the wound. 02/26/2022: The wound looks quite a bit better today and the odor is gone. I still do not have her culture data back, but she seems to be responding well to the Augmentin. 03/06/2022: Her wound continues to improve. There is still some slough accumulation, but the periwound skin is less inflamed. Her culture returned with a polymicrobial population including Pseudomonas and so levofloxacin was also prescribed. She did not understand why a second antibiotic was being added so she has not yet initiated this. 03/14/2022: The wound is about the same, to perhaps a little bit larger. There is a fair amount of slough accumulation on the surface, as well as some hypertrophic granulation tissue. She is still taking levofloxacin, but has completed taking Augmentin. She has her Jodie Echevaria compound with her today. 12/14; the patient has a significant circular wound on the posterior right calf. She is using Keystone and silver alginate under 3 layer compression. Her ABIs were within normal limits at 0.97. This may have been traumatic or an insect bite at the start I reviewed these records. 04/04/2022: Since I last saw the wound, it has contracted considerably. There is a fairly thick layer of slough on the surface, as it has not been debrided since the last time I did it. Edema control is excellent and the periwound skin is in much better condition. 04/12/2022: No significant change in the wound dimensions. It is filling with granulation tissue. Still with slough accumulation on the surface. 04/19/2022: The wound is smaller this week. There is some slough accumulation on the  surface. 04/26/2022: The wound is smaller again this week and significantly cleaner. The wound surface is a little bit drier than ideal. 05/03/2022: The wound measurements were about the same, but visually it appears smaller. There  is still some undermining at the top of the wound. Moisture balance is better this week. 05/10/2022: The wound measured smaller today. There is still a fair amount of undermining present. Slough has built up on the surface. We are still awaiting snap VAC approval. 05/17/2022: The wound is a little bit smaller today. There is less slough on the surface, but the granulation tissue still is not very robust. She has been approved for snap VAC but will have a 20% coinsurance and it is not clear what that amount would end up being for her. 05/27/2022: The surface of the wound has deteriorated and it is gray and fibrotic. No significant odor, but the drainage on her dressing was a little bit purulent. 05/31/2022: The wound looks quite a bit better today. It is still a little fibrotic but no longer has purulent-looking drainage. The color is better. She spoke with her insurance company and is interested in trying the snap VAC, now that she is aware of the cost to her. 06/07/2022: After 1 week in the snap VAC, there has been substantial improvement to her wound. The undermined portion is closing in. The surface has a healthier color and appearance. There is very minimal slough accumulation. 06/14/2022: The more shallow part of the undermined portion of her wound has closed. There is still some undermining from about 1-2 o'clock. The surface continues to improve. Minimal slough accumulation. 06/28/2022: The wound is shallower this week and the undermining has essentially closed and completely. The surface tissue is improving. 07/05/2022: The depth is almost immeasurable and the surface is nearly flush with the surrounding skin. The undermining has been eliminated. There is light slough on the wound surface. 07/12/2022: There is a little bit of slough on the wound surface. The depth is about the same as last week. 07/19/2022: The wound is essentially flush with the surrounding skin surface. There is slight slough present. No  real change in the AP or transverse dimensions. 07/26/2022: The wound measured a little bit smaller today. It is flush with the surrounding skin surface and there is good granulation tissue present. Minimal slough and biofilm accumulation. 08/02/2022: The right posterior leg wound is a little bit smaller. There is a little slough accumulation. She reported a new wound to [place] today. It is on her left posterior calf. It has been present for about 6 weeks. She is not sure of how it occurred. She has been trying to manage it at home with topical Neosporin and Band-Aids. The fat layer is exposed. The surface is fibrotic and covered with slough. 08/09/2022: The right posterior leg wound is smaller again this week. There is good granulation tissue on the surface with minimal slough accumulation. The left posterior calf wound has built up a thick layer of slough. It is still quite fibrotic underneath the slough, but there is a little bit of a pink color beginning to emerge. Edema control is good. 08/16/2022: Both wounds are smaller this week. There is minimal slough on the right posterior leg wound. There is slough that has reaccumulated on the left posterior calf wound but there is a little bit more granulation tissue emerging. 08/23/2022: Both wounds are about the same size this week, but the quality of the tissue and amount of granulation tissue on  the left are both better. 08/30/2022: Both wounds measure smaller today, but there has been some tissue breakdown adjacent to the left leg wound. She says it has been itchy. There is a little bit of slough on the surface of both wounds. 09/06/2022: The right wound measures smaller today. There is minimal slough on the wound surface. The left wound is about the same size. The periwound tissue looks better this week. There is still a layer of fibrinous slough on the left wound. Edema control is good bilaterally. 6/7 the patient has wounds on bilateral calfs. We are using  silver alginate on the right Iodoflex on the left under Urgo lite compression. The wounds are measuring smaller. MAEVIS, MUMBY ([id number]) [id number] Page 4 of 12 09/20/2022: The right wound is flush with the surrounding skin. There is good granulation tissue with some biofilm and thin eschar present. On the left, there is some slough accumulation and the patient says it has been a little itchy this week. 09/27/2022: No significant change to the right wound. The left wound measures a little smaller, but still has some depth to it with slough accumulation. 10/04/2022: Nice improvement in both wounds. They are smaller and more superficial. The left wound has better granulation tissue and no longer has the hard fibrous surface. 10/11/2022: Both wounds continue to contract. There is minimal slough and eschar present. Granulation tissue continues to fill in on the left. Edema control is good. 10/21/2022: The right leg wound is about half the size as it was last week. There is a little perimeter eschar and some light slough on the surface. The left leg wound is about the same size, but shallower. There is still slough accumulation but the buds of granulation tissue are getting larger. Edema control remains excellent. 10/30/2022: The right leg wound is healed. The left leg wound is smaller with better granulation tissue and only a little bit of slough present. Edema control is excellent. Electronic Signature(s) Signed: 10/30/2022 11:44:00 AM By: [name] MD FACS Entered By: [name] on 10/30/2022 11:43:59 -------------------------------------------------------------------------------- Physical Exam Details Patient Name: Date of Service: [name], [name] 10/30/2022 11:15 A M Medical Record Number: [id number] Patient Account Number: [id number] Date of Birth/Sex: Treating RN: 08/18/1960 (62 y.o. F) Primary Care Provider: Nira Conn Other  Clinician: Referring Provider: Treating Provider/Extender: Andrey Campanile in Treatment: 40 Constitutional . . . . no acute distress. Respiratory Normal work of breathing on room air. Notes 10/30/2022: The right leg wound is healed. The left leg wound is smaller with better granulation tissue and only a little bit of slough present. Edema control is excellent. Electronic Signature(s) Signed: 10/30/2022 11:45:18 AM By: [name] MD FACS Entered By: [name] on 10/30/2022 11:45:18 -------------------------------------------------------------------------------- Physician Orders Details Patient Name: Date of Service: [name], [name] 10/30/2022 11:15 A M Medical Record Number: [id number] Patient Account Number: [id number] Date of Birth/Sex: Treating RN: 1961/03/31 (62 y.o. 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 - Dr. Lady Gary RM 3 Return A Thursday 8/1 @  1:00 pm Anesthetic (In clinic) Topical Lidocaine 4% applied to wound bed Bathing/ Shower/ Hygiene May shower with protection but do not get wound dressing(s) wet. Protect dressing(s) with water repellant cover (for example, large plastic bag) or a cast cover and may then take shower. Edema Control - Lymphedema / SCD / Other Bilateral Lower Extremities Elevate legs to the level of the heart or above  for 30 minutes daily and/or when sitting for 3-4 times a day throughout the day. Avoid standing for long periods of time. Exercise regularly Moisturize legs daily. Compression stocking or Garment 20-30 mm/Hg pressure to: - right leg daily Wound Treatment Wound #3 - Lower Leg Wound Laterality: Left, Posterior Cleanser: Soap and Water 1 x Per Week/30 Days Discharge Instructions: May shower and wash wound with dial antibacterial soap and water prior to dressing change. Cleanser: Wound Cleanser 1 x Per Week/30 Days Discharge Instructions: Cleanse the wound with wound cleanser prior to applying a clean dressing using gauze sponges, not tissue or cotton balls. Peri-Wound Care: Sween Lotion (Moisturizing lotion) 1 x Per Week/30 Days Discharge Instructions: Apply moisturizing lotion as directed Topical: Gentamicin 1 x Per Week/30 Days Discharge Instructions: As directed by physician Topical: Mupirocin Ointment 1 x Per Week/30 Days Discharge Instructions: Apply Mupirocin (Bactroban) as instructed Prim Dressing: Promogran Prisma Matrix, 4.34 (sq in) (silver collagen) 1 x Per Week/30 Days ary Discharge Instructions: Moisten collagen with saline or hydrogel Secondary Dressing: Woven Gauze Sponge, Non-Sterile 4x4 in 1 x Per Week/30 Days Discharge Instructions: Apply over primary dressing as directed. Compression Wrap: Urgo K2 Lite, (equivalent to a 3 layer) two layer compression system, regular 1 x Per Week/30 Days Discharge Instructions: Apply Urgo K2 Lite as directed (alternative to 3 layer compression). Electronic Signature(s) Signed: 10/30/2022 11:48:04 AM By: [name] MD FACS Entered By: [name] on 10/30/2022 11:45:31 -------------------------------------------------------------------------------- Problem List Details Patient Name: Date of Service: [name], [name] 10/30/2022 11:15 A M Medical Record Number: [id number] Patient Account Number: [id number] Date of Birth/Sex:  Treating RN: [birth year] (62 y.o. [name] Primary Care Provider: Nira Conn Other Clinician: Referring Provider: Treating Provider/Extender: Andrey Campanile in Treatment: [address] MORENA, MCKISSACK ([id number]) [id number] Page 6 of 12 ICD-10 Encounter Code Description Active Date MDM Diagnosis L97.222 Non-pressure chronic ulcer of left calf with fat layer exposed 08/02/2022 No Yes E66.01 Morbid (severe) obesity due to excess calories 01/23/2022 No Yes I10 Essential (primary) hypertension 01/23/2022 No Yes R60.0 Localized edema 01/23/2022 No Yes Inactive Problems Resolved Problems ICD-10 Code Description Active Date Resolved Date L97.212 Non-pressure chronic ulcer of right calf with fat layer exposed 01/23/2022 01/23/2022 Electronic Signature(s) Signed: 10/30/2022 11:43:15 AM By: [name] MD FACS Entered By: [name] on 10/30/2022 11:43:15 -------------------------------------------------------------------------------- Progress Note Details Patient Name: Date of Service: [name], [name] 10/30/2022 11:15 A M Medical Record Number: [id number] Patient Account Number: [id number] Date of Birth/Sex: Treating RN: 04-21-1960 (62 y.o. F) Primary Care Provider: Nira Conn Other Clinician: Referring Provider: Treating Provider/Extender: Andrey Campanile in Treatment: 40 Subjective Chief Complaint Information obtained from Patient RLE ulcer History of Present Illness (HPI) 02/16/2020 upon evaluation today patient actually appears to be doing poorly in regard to her left medial lower extremity ulcer. This is actually an area that she tells me she has had intermittent issues with over the years although has been closed for some time she typically uses compression right now she has juxta lite compression wraps. With that being  said she tells me that this nonetheless  open several weeks/months ago and has been given her trouble since. She does have a history of chronic venous insufficiency she is seeing specialist for this in the past she has had an ablation as well as sclerotherapy. With that being said she also has hypertension chronically which is managed by her primary care provider. In general she seems to be worsening overall with regard to the wound and states that she finally realized that she needed to come in and have somebody look at this and not continue to try to manage this on her own. No fevers, chills, nausea, vomiting, or diarrhea. 02/23/2020 on evaluation today patient appears to be doing well with regard to her wound. This is showing some signs of improvement which is great news still were not quite at the point where I would like to be as far as the overall appearance of the wound is concerned but I do believe this is better than last week. I do believe the Iodoflex is helping as well. 03/08/2020 upon evaluation today patient appears to be doing well with regard to her wound. She has been tolerating the dressing changes without complication. Fortunately I feel like she has made great progress with the Iodoflex but I feel like it may be the point rest to switch to something else possibly a collagen- based dressing at this time. 03/15/2020 upon evaluation today patient appears to be doing excellent in regard to her leg ulcer. She has been tolerating the dressing changes without complication. Fortunately there is no signs of active infection. Overall she is measuring a little bit smaller today which is great news. TANAYAH, SQUITIERI ([id number]) [id number] Page 7 of 12 03/22/2020 upon evaluation today patient appears to be doing well with regard to her wound. She has been tolerating the dressing changes without complication. Fortunately there is no signs of active infection at this time. 03/28/2020; patient I do not  usually see however she has a wound on the left anterior lower leg secondary to chronic venous insufficiency we have been using silver collagen under compression. She arrives in clinic with a nonviable surface requiring debridement 04/12/2020 upon evaluation today patient appears to be doing well all things considered with regard to her leg ulcer. She is tolerating the dressing changes without complication there is minimal dry skin around the edges of the wound that may be trapping and stopping some of the events of the new skin I am can work on that today. Otherwise the surface of the wound appears to be doing excellent. 04/19/2020 upon evaluation today patient appears to be doing well with regard to her leg ulcer. She has been tolerating dressing changes without complication. Fortunately there is no signs of active infection at this time. No fever chills noted overall very pleased with how things seem to be progressing. 04/26/2020 on evaluation today patient appears to be doing well with regard to her wound currently. Is showing signs of excellent improvement overall is filling in nicely and there does not appear to be any signs of infection. No fevers, chills, nausea, vomiting, or diarrhea. 05/03/2020 upon evaluation today patient appears to be doing well with regard to her wound on the leg. This overall showing signs of good improvement which is great she has some good epithelial growth and overall I think that things are moving in the correct direction. We likewise going to continue with the wound care measures as before since she seems to making such  good improvement. 2/3; venous wound on the left medial leg. This is contracting. We are using Prisma and 3 layer compression. She has a stocking and waiting in the eventuality this heals. She is already using it on the right 05/17/2020 upon evaluation today patient appears to be doing well with regard to her leg ulcer. She has been tolerating the dressing  changes without complication. Fortunately there is no signs of active infection which is great news and overall very pleased with where things stand today. No fevers, chills, nausea, vomiting, or diarrhea. 05/24/2020 upon evaluation today patient appears to be doing well with regard to her wound. Overall I feel like she is making excellent progress. There does not appear to be any signs of active infection which is great news. 05/31/2020 upon evaluation today patient appears to be doing well with regard to her wound. There does not appear to be any signs of active infection which is great news overall I am extremely pleased with where things stand today. READMISSION 01/23/2022 She returns to clinic today with a new wound on her right posterior calf. She says that she was cleaning out an old shed near the middle of August this year and then noticed what seemed to be a bug bite on her right posterior calf. It was itchy, red, and raised. By the end of September, an ulcer had developed. She has been applying various topical creams such as hydrocortisone and others to the site. When it was not improving, she made an appointment in the wound care center. ABI in clinic today was 0.97. On her right posterior calf, there is a circular wound with necrotic fat and black eschar present. There is no purulent drainage or malodor. The periwound skin is in good condition with just a little induration that appears to be secondary to inflammation. 01/31/2022: The wound measures a little bit larger today, but overall is quite a bit cleaner. There is some undermining from 9:00 to 3:00. She is having some periwound itching and says that her wrap slid. 02/08/2022: Despite using an Unna boot first layer at the top of her wrap, it slid again and it looks like there is been some bruising at the wound site. The wound is about the same size in terms of dimensions. There is a fair amount of slough and nonviable tissue still  present. Edema control is better than last week. 02/15/2022: The wound dimensions are about the same. There is less nonviable tissue present. The periwound erythema has improved. 02/22/2022: The wound has deteriorated over the past week. It is larger and the periwound is more edematous, erythematous, and indurated. It looks as though there has been more tissue breakdown with undermining present. She is having more pain. There is a foul odor coming from the wound. 02/26/2022: The wound looks quite a bit better today and the odor is gone. I still do not have her culture data back, but she seems to be responding well to the Augmentin. 03/06/2022: Her wound continues to improve. There is still some slough accumulation, but the periwound skin is less inflamed. Her culture returned with a polymicrobial population including Pseudomonas and so levofloxacin was also prescribed. She did not understand why a second antibiotic was being added so she has not yet initiated this. 03/14/2022: The wound is about the same, to perhaps a little bit larger. There is a fair amount of slough accumulation on the surface, as well as some hypertrophic granulation tissue. She is still taking levofloxacin, but  has completed taking Augmentin. She has her Jodie Echevaria compound with her today. 12/14; the patient has a significant circular wound on the posterior right calf. She is using Keystone and silver alginate under 3 layer compression. Her ABIs were within normal limits at 0.97. This may have been traumatic or an insect bite at the start I reviewed these records. 04/04/2022: Since I last saw the wound, it has contracted considerably. There is a fairly thick layer of slough on the surface, as it has not been debrided since the last time I did it. Edema control is excellent and the periwound skin is in much better condition. 04/12/2022: No significant change in the wound dimensions. It is filling with granulation tissue. Still with  slough accumulation on the surface. 04/19/2022: The wound is smaller this week. There is some slough accumulation on the surface. 04/26/2022: The wound is smaller again this week and significantly cleaner. The wound surface is a little bit drier than ideal. 05/03/2022: The wound measurements were about the same, but visually it appears smaller. There is still some undermining at the top of the wound. Moisture balance is better this week. 05/10/2022: The wound measured smaller today. There is still a fair amount of undermining present. Slough has built up on the surface. We are still awaiting snap VAC approval. 05/17/2022: The wound is a little bit smaller today. There is less slough on the surface, but the granulation tissue still is not very robust. She has been approved for snap VAC but will have a 20% coinsurance and it is not clear what that amount would end up being for her. 05/27/2022: The surface of the wound has deteriorated and it is gray and fibrotic. No significant odor, but the drainage on her dressing was a little bit purulent. 05/31/2022: The wound looks quite a bit better today. It is still a little fibrotic but no longer has purulent-looking drainage. The color is better. She spoke with her insurance company and is interested in trying the snap VAC, now that she is aware of the cost to her. 06/07/2022: After 1 week in the snap VAC, there has been substantial improvement to her wound. The undermined portion is closing in. The surface has a healthier color and appearance. There is very minimal slough accumulation. 06/14/2022: The more shallow part of the undermined portion of her wound has closed. There is still some undermining from about 1-2 o'clock. The surface continues to improve. Minimal slough accumulation. JACLENE, BARTELT ([id number]) [id number] Page 8 of 12 06/28/2022: The wound is shallower this week and the undermining has essentially closed and completely.  The surface tissue is improving. 07/05/2022: The depth is almost immeasurable and the surface is nearly flush with the surrounding skin. The undermining has been eliminated. There is light slough on the wound surface. 07/12/2022: There is a little bit of slough on the wound surface. The depth is about the same as last week. 07/19/2022: The wound is essentially flush with the surrounding skin surface. There is slight slough present. No real change in the AP or transverse dimensions. 07/26/2022: The wound measured a little bit smaller today. It is flush with the surrounding skin surface and there is good granulation tissue present. Minimal slough and biofilm accumulation. 08/02/2022: The right posterior leg wound is a little bit smaller. There is a little slough accumulation. She reported a new wound to [place] today. It is on her left posterior calf. It has been present for about 6 weeks. She is not sure of  how it occurred. She has been trying to manage it at home with topical Neosporin and Band-Aids. The fat layer is exposed. The surface is fibrotic and covered with slough. 08/09/2022: The right posterior leg wound is smaller again this week. There is good granulation tissue on the surface with minimal slough accumulation. The left posterior calf wound has built up a thick layer of slough. It is still quite fibrotic underneath the slough, but there is a little bit of a pink color beginning to emerge. Edema control is good. 08/16/2022: Both wounds are smaller this week. There is minimal slough on the right posterior leg wound. There is slough that has reaccumulated on the left posterior calf wound but there is a little bit more granulation tissue emerging. 08/23/2022: Both wounds are about the same size this week, but the quality of the tissue and amount of granulation tissue on the left are both better. 08/30/2022: Both wounds measure smaller today, but there has been some tissue breakdown adjacent to the left leg  wound. She says it has been itchy. There is a little bit of slough on the surface of both wounds. 09/06/2022: The right wound measures smaller today. There is minimal slough on the wound surface. The left wound is about the same size. The periwound tissue looks better this week. There is still a layer of fibrinous slough on the left wound. Edema control is good bilaterally. 6/7 the patient has wounds on bilateral calfs. We are using silver alginate on the right Iodoflex on the left under Urgo lite compression. The wounds are measuring smaller. 09/20/2022: The right wound is flush with the surrounding skin. There is good granulation tissue with some biofilm and thin eschar present. On the left, there is some slough accumulation and the patient says it has been a little itchy this week. 09/27/2022: No significant change to the right wound. The left wound measures a little smaller, but still has some depth to it with slough accumulation. 10/04/2022: Nice improvement in both wounds. They are smaller and more superficial. The left wound has better granulation tissue and no longer has the hard fibrous surface. 10/11/2022: Both wounds continue to contract. There is minimal slough and eschar present. Granulation tissue continues to fill in on the left. Edema control is good. 10/21/2022: The right leg wound is about half the size as it was last week. There is a little perimeter eschar and some light slough on the surface. The left leg wound is about the same size, but shallower. There is still slough accumulation but the buds of granulation tissue are getting larger. Edema control remains excellent. 10/30/2022: The right leg wound is healed. The left leg wound is smaller with better granulation tissue and only a little bit of slough present. Edema control is excellent. Patient History Information obtained from Patient. Family History Cancer - Mother, Diabetes - Father, Hypertension - Mother, Kidney Disease -  Mother, Lung Disease - Father, Thyroid Problems - Paternal Grandparents, No family history of Heart Disease, Seizures, Stroke, Tuberculosis. Social History Never smoker, Marital Status - Married, Alcohol Use - Never, Drug Use - No History, Caffeine Use - Daily - coffee. Medical History Eyes Denies history of Cataracts, Glaucoma, Optic Neuritis Ear/Nose/Mouth/Throat Denies history of Chronic sinus problems/congestion, Middle ear problems Hematologic/Lymphatic Patient has history of Anemia - iron Denies history of Hemophilia, Human Immunodeficiency Virus, Lymphedema, Sickle Cell Disease Respiratory Patient has history of Sleep Apnea - CPAP Denies history of Aspiration, Asthma, Chronic Obstructive Pulmonary Disease (COPD), Pneumothorax,  Tuberculosis Cardiovascular Patient has history of Hypertension, Peripheral Venous Disease Denies history of Angina, Arrhythmia, Congestive Heart Failure, Coronary Artery Disease, Deep Vein Thrombosis, Hypotension, Myocardial Infarction, Peripheral Arterial Disease, Phlebitis, Vasculitis Gastrointestinal Denies history of Cirrhosis , Colitis, Crohns, Hepatitis A, Hepatitis B, Hepatitis C Endocrine Denies history of Type I Diabetes, Type II Diabetes Genitourinary Denies history of End Stage Renal Disease Immunological Denies history of Lupus Erythematosus, Raynauds, Scleroderma Integumentary (Skin) Denies history of History of Burn Musculoskeletal Denies history of Gout, Rheumatoid Arthritis, Osteoarthritis, Osteomyelitis Neurologic Denies history of Dementia, Neuropathy, Quadriplegia, Paraplegia, Seizure Disorder KAILEIA, FLOW ([id number]) [id number] Page 9 of 12 Oncologic Denies history of Received Chemotherapy, Received Radiation Psychiatric Denies history of Anorexia/bulimia, Confinement Anxiety Hospitalization/Surgery History - cholecystectomy 1980s. - nephrolithasis 1980s. - plate and rod in right elbow surgery  2010. 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 Vitals Time Taken: 11:22 AM, Height: 61 in, Weight: 277 lbs, BMI: 52.3, Temperature: 98.3 F, Pulse: 87 bpm, Respiratory Rate: 20 breaths/min, Blood Pressure: 117/83 mmHg. Respiratory Normal work of breathing on room air. General Notes: 10/30/2022: The right leg wound is healed. The left leg wound is smaller with better granulation tissue and only a little bit of slough present. Edema control is excellent. Integumentary (Hair, Skin) Wound #2 status is Healed - Epithelialized. Original cause of wound was Insect Bite. The date acquired was: 11/21/2021. The wound has been in treatment 40 weeks. The wound is located on the Right,Posterior Lower Leg. The wound measures 0cm length x 0cm width x 0cm depth; 0cm^2 area and 0cm^3 volume. There is no tunneling or undermining noted. There is a none present amount of drainage noted. There is no granulation within the wound bed. There is no necrotic tissue within the wound bed. The periwound skin appearance had no abnormalities noted for texture. The periwound skin appearance had no abnormalities noted for moisture. The periwound skin appearance exhibited: Hemosiderin Staining. The periwound skin appearance did not exhibit: Atrophie Blanche, Ecchymosis, Rubor, Erythema. Periwound temperature was noted as No Abnormality. Wound #3 status is Open. Original cause of wound was Trauma. The date acquired was: 06/28/2022. The wound has been in treatment 12 weeks. The wound is located on the Left,Posterior Lower Leg. The wound measures 0.9cm length x 0.6cm width x 0.1cm depth; 0.424cm^2 area and 0.042cm^3 volume. There is Fat Layer (Subcutaneous Tissue) exposed. There is no tunneling or undermining noted. There is a small amount of serosanguineous drainage noted. The wound margin is flat and intact.  There is large (67-100%) red granulation within the wound bed. There is a small (1-33%) amount of necrotic tissue within the wound bed including Adherent Slough. The periwound skin appearance had no abnormalities noted for texture. The periwound skin appearance had no abnormalities noted for moisture. The periwound skin appearance had no abnormalities noted for color. Periwound temperature was noted as No Abnormality. The periwound has tenderness on palpation. Assessment Active Problems ICD-10 Non-pressure chronic ulcer of left calf with fat layer exposed Morbid (severe) obesity due to excess calories Essential (primary) hypertension Localized edema Procedures Wound #3 Pre-procedure diagnosis of Wound #3 is a Venous Leg Ulcer located on the Left,Posterior Lower Leg .Severity of Tissue Pre Debridement is: Fat layer exposed. There was a Selective/Open Wound Non-Viable Tissue Debridement with a total area of 0.42 sq cm performed by [name], MD. With the following instrument(s): Curette to remove Non-Viable tissue/material. Material removed  includes Slough after achieving pain control using Lidocaine 4% Topical Solution. No specimens were taken. A time out was conducted at 11:40, prior to the start of the procedure. A Minimum amount of bleeding was controlled with Pressure. The procedure was tolerated well with a pain level of 0 throughout and a pain level of 0 following the procedure. Post Debridement Measurements: 0.9cm length x 0.6cm width x 0.1cm depth; 0.042cm^3 volume. Character of Wound/Ulcer Post Debridement is improved. Severity of Tissue Post Debridement is: Fat layer exposed. Post procedure Diagnosis Wound #3: Same as Pre-Procedure General Notes: Scribed for Dr. Lady Gary by [name], RN. Pre-procedure diagnosis of Wound #3 is a Venous Leg Ulcer located on the Left,Posterior Lower Leg . There was a Double Layer Compression Therapy Procedure by [name], RN. ANNALYN, BLECHER ([id number]) [id number] Page 10 of 12 Post procedure Diagnosis Wound #3: Same as Pre-Procedure Notes: urgo lite. Plan Follow-up Appointments: Return Appointment in 1 week. - Dr. Lady Gary RM 3 Thursday 8/1 @ 1:00 pm Anesthetic: (In clinic) Topical Lidocaine 4% applied to wound bed Bathing/ Shower/ Hygiene: May shower with protection but do not get wound dressing(s) wet. Protect dressing(s) with water repellant cover (for example, large plastic bag) or a cast cover and may then take shower. Edema Control - Lymphedema / SCD / Other: Elevate legs to the level of the heart or above for 30 minutes daily and/or when sitting for 3-4 times a day throughout the day. Avoid standing for long periods of time. Exercise regularly Moisturize legs daily. Compression stocking or Garment 20-30 mm/Hg pressure to: - right leg daily WOUND #3: - Lower Leg Wound Laterality: Left, Posterior Cleanser: Soap and Water 1 x Per Week/30 Days Discharge Instructions: May shower and wash wound with dial antibacterial soap and water prior to dressing change. Cleanser: Wound Cleanser 1 x Per Week/30 Days Discharge Instructions: Cleanse the wound with wound cleanser prior to applying a clean dressing using gauze sponges, not tissue or cotton balls. Peri-Wound Care: Sween Lotion (Moisturizing lotion) 1 x Per Week/30 Days Discharge Instructions: Apply moisturizing lotion as directed Topical: Gentamicin 1 x Per Week/30 Days Discharge Instructions: As directed by physician Topical: Mupirocin Ointment 1 x Per Week/30 Days Discharge Instructions: Apply Mupirocin (Bactroban) as instructed Prim Dressing: Promogran Prisma Matrix, 4.34 (sq in) (silver collagen) 1 x Per Week/30 Days ary Discharge Instructions: Moisten collagen with saline or hydrogel Secondary Dressing: Woven Gauze Sponge, Non-Sterile 4x4 in 1 x Per Week/30 Days Discharge Instructions: Apply over primary dressing as directed. Com  pression Wrap: Urgo K2 Lite, (equivalent to a 3 layer) two layer compression system, regular 1 x Per Week/30 Days Discharge Instructions: Apply Urgo K2 Lite as directed (alternative to 3 layer compression). 10/30/2022: The right leg wound is healed. The left leg wound is smaller with better granulation tissue and only a little bit of slough present. Edema control is excellent. I used a curette to debride the slough from the left leg wound. We will continue the mixture of topical gentamicin. 7 with silver alginate and an Urgo lite wrap. She has juxta lite stockings at home but did not bring them today. We will plan a Tubigrip on her right leg and apply her stocking when she arrives home. Follow- up in 1 week. Electronic Signature(s) Signed: 10/30/2022 11:46:12 AM By: [name] MD FACS Entered By: [name] on 10/30/2022 11:46:12 -------------------------------------------------------------------------------- HxROS Details Patient Name: Date of Service: [name], [name] 10/30/2022 11:15 A M Medical Record Number: [id number] Patient  Account Number: [id number] Date of Birth/Sex: Treating RN: Oct 28, 1960 (62 y.o. F) Primary Care Provider: Nira Conn Other Clinician: Referring Provider: Treating Provider/Extender: Andrey Campanile in Treatment: 40 Information Obtained From Patient Eyes Medical History: Negative for: Cataracts; Glaucoma; Optic Neuritis Ear/Nose/Mouth/Throat NECHAMA, ESCUTIA ([id number]) [id number] Page 11 of 12 Medical History: Negative for: Chronic sinus problems/congestion; Middle ear problems Hematologic/Lymphatic Medical History: Positive for: Anemia - iron Negative for: Hemophilia; Human Immunodeficiency Virus; Lymphedema; Sickle Cell Disease Respiratory Medical History: Positive for: Sleep Apnea - CPAP Negative for: Aspiration; Asthma; Chronic Obstructive Pulmonary Disease (COPD); Pneumothorax;  Tuberculosis Cardiovascular Medical History: Positive for: Hypertension; Peripheral Venous Disease Negative for: Angina; Arrhythmia; Congestive Heart Failure; Coronary Artery Disease; Deep Vein Thrombosis; Hypotension; Myocardial Infarction; Peripheral Arterial Disease; Phlebitis; Vasculitis Gastrointestinal Medical History: Negative for: Cirrhosis ; Colitis; Crohns; Hepatitis A; Hepatitis B; Hepatitis C Endocrine Medical History: Negative for: Type I Diabetes; Type II Diabetes Genitourinary Medical History: Negative for: End Stage Renal Disease Past Medical History Notes: years ago kidney stones Immunological Medical History: Negative for: Lupus Erythematosus; Raynauds; Scleroderma Integumentary (Skin) Medical History: Negative for: History of Burn Musculoskeletal Medical History: Negative for: Gout; Rheumatoid Arthritis; Osteoarthritis; Osteomyelitis Neurologic Medical History: Negative for: Dementia; Neuropathy; Quadriplegia; Paraplegia; Seizure Disorder Oncologic Medical History: Negative for: Received Chemotherapy; Received Radiation Past Medical History Notes: skin Ca removed from back years ago Psychiatric Medical History: Negative for: Anorexia/bulimia; Confinement Anxiety Immunizations Pneumococcal Vaccine: Received Pneumococcal Vaccination: No Implantable Devices None ZARYAH, SECKEL ([id number]) [id number] Page 12 of 12 Hospitalization / Surgery History Type of Hospitalization/Surgery cholecystectomy 1980s nephrolithasis 1980s plate and rod in right elbow surgery 2010 Family and Social History Cancer: Yes - Mother; Diabetes: Yes - Father; Heart Disease: No; Hypertension: Yes - Mother; Kidney Disease: Yes - Mother; Lung Disease: Yes - Father; Seizures: No; Stroke: No; Thyroid Problems: Yes - Paternal Grandparents; Tuberculosis: No; Never smoker; Marital Status - Married; Alcohol Use: Never; Drug Use: No History; Caffeine Use:  Daily - coffee; Financial Concerns: No; Food, Clothing or Shelter Needs: No; Support System Lacking: No; Transportation Concerns: No Electronic Signature(s) Signed: 10/30/2022 11:48:04 AM By: [name] MD FACS Entered By: [name] on 10/30/2022 11:44:53 -------------------------------------------------------------------------------- SuperBill Details Patient Name: Date of Service: [name], [name] 10/30/2022 Medical Record Number: [id number] Patient Account Number: [id number] Date of Birth/Sex: Treating RN: 1960-12-15 (62 y.o. 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 Morbid (severe) obesity due to excess calories I10 Essential (primary) hypertension R60.0 Localized edema Facility Procedures : CPT4 Code: [id number] Description: 97597 - DEBRIDE WOUND 1ST 20 SQ CM OR < ICD-10 Diagnosis Description L97.222 Non-pressure chronic ulcer of left calf with fat layer exposed Modifier: Quantity: 1 Physician Procedures : CPT4 Code Description Modifier [id number] - WC PHYS LEVEL 4 - EST PT 25 ICD-10 Diagnosis Description L97.222 Non-pressure chronic ulcer of left calf with fat layer exposed E66.01 Morbid (severe) obesity due to excess calories R60.0 Localized edema  I10 Essential (primary) hypertension Quantity: 1 : [id number] 97597 - WC PHYS DEBR WO ANESTH 20 SQ CM ICD-10 Diagnosis Description L97.222 Non-pressure chronic ulcer of left calf with fat layer exposed Quantity: 1 Electronic Signature(s) Signed: 10/30/2022 11:46:28 AM By: [name] MD FACS Entered By: [name] on 10/30/2022 11:46:28

## 2022-10-30 NOTE — Progress Notes (Signed)
[name], [name] ([id number]) [id number] Page 1 of 9 Visit Report for 10/30/2022 Arrival Information Details Patient Name: Date of Service: [name], [name]. 10/30/2022 11:15 A M Medical Record Number: [id number] Patient Account Number: [id number] Date of Birth/Sex: Treating RN: 1961-03-05 (62 y.o. [name], [name] Primary Care [name]: [name] Other Clinician: Referring [name]: Treating [name]/Extender: [name] in Treatment: 40 Visit Information History Since Last Visit Added or deleted any medications: No Patient Arrived: Ambulatory Any new allergies or adverse reactions: No Arrival Time: 11:19 Had a fall or experienced change in No Accompanied By: self activities of daily living that may affect Transfer Assistance: None risk of falls: Patient Identification Verified: Yes Signs or symptoms of abuse/neglect since last visito No Secondary Verification Process Completed: Yes Hospitalized since last visit: No Patient Requires Transmission-Based Precautions: No Implantable device outside of the clinic excluding No Patient Has Alerts: No cellular tissue based products placed in the center since last visit: Has Dressing in Place as Prescribed: Yes Has Compression in Place as Prescribed: Yes Pain Present Now: No Electronic Signature(s) Signed: 10/30/2022 5:09:00 PM By: [name] Deed RN, BSN Entered By: [name] on 10/30/2022 11:19:39 -------------------------------------------------------------------------------- Compression Therapy Details Patient Name: Date of Service: [name], [name] 10/30/2022 11:15 A M Medical Record Number: [id number] Patient Account Number: [id number] Date of Birth/Sex: Treating RN: 1960-05-17 (62 y.o. [name] Primary Care [name]: [name] Other Clinician: Referring Kanae Ignatowski: Treating [name]/Extender: [name] in  Treatment: 40 Compression Therapy Performed for Wound Assessment: Wound #3 Left,Posterior Lower Leg Performed By: Clinician [name] Deed, RN Compression Type: Double Layer Post Procedure Diagnosis Same as Pre-procedure Notes urgo lite Electronic Signature(s) Signed: 10/30/2022 5:09:00 PM By: [name] Deed RN, BSN Entered By: [name] on 10/30/2022 11:39:46 [name] ([id number]) [id number] Page 2 of 9 -------------------------------------------------------------------------------- Encounter Discharge Information Details Patient Name: Date of Service: [name] Di­az. 10/30/2022 11:15 A M Medical Record Number: [id number] Patient Account Number: [id number] Date of Birth/Sex: Treating RN: 12/27/1960 (62 y.o. [name] Primary Care [name]: [name] Other Clinician: Referring [name]: Treating [name]/Extender: [name] in Treatment: 40 Encounter Discharge Information Items Post Procedure Vitals Discharge Condition: Stable Temperature (F): 98.3 Ambulatory Status: Ambulatory Pulse (bpm): 87 Discharge Destination: Home Respiratory Rate (breaths/min): 20 Transportation: Private Auto Blood Pressure (mmHg): 117/83 Accompanied By: spouse Schedule Follow-up Appointment: Yes Clinical Summary of Care: Patient Declined Electronic Signature(s) Signed: 10/30/2022 5:09:00 PM By: [name] Deed RN, BSN Entered By: [name] on 10/30/2022 11:59:49 -------------------------------------------------------------------------------- Lower Extremity Assessment Details Patient Name: Date of Service: [name], [name] 10/30/2022 11:15 A M Medical Record Number: [id number] Patient Account Number: [id number] Date of Birth/Sex: Treating RN: [birth year] (62 y.o. [name] Primary Care [name]: [name] Other Clinician: Referring [name]: Treating [name]/Extender: [name] in Treatment: 40 Edema Assessment Assessed: [Left: No] [Right: No] Edema: [Left: Yes] [Right: Yes] Calf Left: Right: Point of Measurement: From Medial Instep 39 cm 39 cm Ankle Left: Right: Point of Measurement: From Medial Instep 22.5 cm 23 cm Vascular Assessment Pulses: Dorsalis Pedis Palpable: [Left:Yes] [Right:Yes] Extremity colors, hair growth, and conditions: Extremity Color: [Left:Hyperpigmented] [Right:Hyperpigmented] Hair Growth on Extremity: [Left:No] [Right:No] Temperature of Extremity: [Left:Warm < 3 seconds] [Right:Warm < 3 seconds] Electronic Signature(s) Signed: 10/30/2022 5:09:00 PM By: [name] Deed RN, BSN Entered By: [name] on 10/30/2022 11:27:40 [name] ([id number]) [id number] Page 3 of 9 -------------------------------------------------------------------------------- Multi Wound  Chart Details Patient Name: Date of Service: [name] 10/30/2022 11:15 A M Medical Record Number: [id number] Patient Account Number: [id number] Date of Birth/Sex: Treating RN: [birth year] (63 y.o. F) Primary Care [name]: [name] Other Clinician: Referring [name]: Treating Tallyn Holroyd/Extender: [name] in Treatment: 40 Vital Signs Height(in): 61 Pulse(bpm): 87 Weight(lbs): 277 Blood Pressure(mmHg): 117/83 Body Mass Index(BMI): 52.3 Temperature(F): 98.3 Respiratory Rate(breaths/min): 20 [2:Photos:] [N/A:N/A] Right, Posterior Lower Leg Left, Posterior Lower Leg N/A Wound Location: Insect Bite Trauma N/A Wounding Event: Venous Leg Ulcer Venous Leg Ulcer N/A Primary Etiology: Anemia, Sleep Apnea, Hypertension, Anemia, Sleep Apnea, Hypertension, N/A Comorbid History: Peripheral Venous Disease Peripheral Venous Disease 11/21/2021 06/28/2022 N/A Date Acquired: 40 12 N/A Weeks of Treatment: Healed - Epithelialized Open N/A Wound Status: No No  N/A Wound Recurrence: 0x0x0 0.9x0.6x0.1 N/A Measurements L x W x D (cm) 0 0.424 N/A A (cm) : rea 0 0.042 N/A Volume (cm) : 100.00% 68.00% N/A % Reduction in A rea: 100.00% 68.40% N/A % Reduction in Volume: Full Thickness Without Exposed Full Thickness Without Exposed N/A Classification: Support Structures Support Structures None Present Small N/A Exudate A mount: N/A Serosanguineous N/A Exudate Type: N/A red, brown N/A Exudate Color: N/A Flat and Intact N/A Wound Margin: None Present (0%) Large (67-100%) N/A Granulation A mount: N/A Red N/A Granulation Quality: None Present (0%) Small (1-33%) N/A Necrotic A mount: Fascia: No Fat Layer (Subcutaneous Tissue): Yes N/A Exposed Structures: Fat Layer (Subcutaneous Tissue): No Fascia: No Tendon: No Tendon: No Muscle: No Muscle: No Joint: No Joint: No Bone: No Bone: No Large (67-100%) Small (1-33%) N/A Epithelialization: N/A Debridement - Selective/Open Wound N/A Debridement: Pre-procedure Verification/Time Out N/A 11:40 N/A Taken: N/A Lidocaine 4% Topical Solution N/A Pain Control: N/A Slough N/A Tissue Debrided: N/A Non-Viable Tissue N/A Level: N/A 0.42 N/A Debridement A (sq cm): rea N/A Curette N/A Instrument: N/A Minimum N/A Bleeding: N/A Pressure N/A Hemostasis A chieved: N/A 0 N/A Procedural Pain: N/A 0 N/A Post Procedural Pain: N/A Procedure was tolerated well N/A Debridement Treatment Response: N/A 0.9x0.6x0.1 N/A Post Debridement Measurements L x LANEYA, GASAWAY ([id number]) [id number] Page 4 of 9 W x D (cm) N/A 0.042 N/A Post Debridement Volume: (cm) Excoriation: No Rash: Yes N/A Periwound Skin Texture: Scarring: No No Abnormalities Noted No Abnormalities Noted N/A Periwound Skin Moisture: Hemosiderin Staining: Yes Erythema: No N/A Periwound Skin Color: Atrophie Blanche: No Ecchymosis: No Erythema: No Rubor: No No Abnormality No Abnormality  N/A Temperature: N/A Yes N/A Tenderness on Palpation: N/A Compression Therapy N/A Procedures Performed: Debridement Treatment Notes Electronic Signature(s) Signed: 10/30/2022 11:43:20 AM By: [name] MD FACS Entered By: [name] on 10/30/2022 11:43:20 -------------------------------------------------------------------------------- Multi-Disciplinary Care Plan Details Patient Name: Date of Service: [place], [name] 10/30/2022 11:15 A M Medical Record Number: [id number] Patient Account Number: [id number] Date of Birth/Sex: Treating RN: October 27, 1960 (62 y.o. [name] Primary Care Carnella Fryman: [name] Other Clinician: Referring Nickey Canedo: Treating Lenzi Marmo/Extender: [name] in Treatment: 40 Multidisciplinary Care Plan reviewed with physician Active Inactive Necrotic Tissue Nursing Diagnoses: Impaired tissue integrity related to necrotic/devitalized tissue Knowledge deficit related to management of necrotic/devitalized tissue Goals: Necrotic/devitalized tissue will be minimized in the wound bed Date Initiated: 01/23/2022 Target Resolution Date: 12/07/2022 Goal Status: Active Patient/caregiver will verbalize understanding of reason and process for debridement of necrotic tissue Date Initiated: 01/23/2022 Target Resolution Date: 12/07/2022 Goal Status: Active Interventions: Assess patient pain level pre-, during and post procedure and prior to  discharge Provide education on necrotic tissue and debridement process Treatment Activities: Apply topical anesthetic as ordered : 01/23/2022 Notes: Venous Leg Ulcer Nursing Diagnoses: Knowledge deficit related to disease process and management Potential for venous Insuffiency (use before diagnosis confirmed) Goals: Patient will maintain optimal edema control Date Initiated: 07/05/2022 Target Resolution Date: 12/07/2022 Goal Status: Active AVANTHIKA, DEHNERT ([id number])  [id number] Page 5 of 9 Interventions: Assess peripheral edema status every visit. Compression as ordered Provide education on venous insufficiency Treatment Activities: Therapeutic compression applied : 07/05/2022 Notes: Wound/Skin Impairment Nursing Diagnoses: Impaired tissue integrity Knowledge deficit related to ulceration/compromised skin integrity Goals: Patient/caregiver will verbalize understanding of skin care regimen Date Initiated: 01/23/2022 Target Resolution Date: 12/07/2022 Goal Status: Active Interventions: Assess patient/caregiver ability to obtain necessary supplies Assess ulceration(s) every visit Treatment Activities: Skin care regimen initiated : 01/23/2022 Topical wound management initiated : 01/23/2022 Notes: Electronic Signature(s) Signed: 10/30/2022 5:09:00 PM By: [name] Deed RN, BSN Entered By: [name] on 10/30/2022 11:36:02 -------------------------------------------------------------------------------- Pain Assessment Details Patient Name: Date of Service: [name], [name] 10/30/2022 11:15 A M Medical Record Number: [id number] Patient Account Number: [id number] Date of Birth/Sex: Treating RN: 1961/02/01 (62 y.o. [name] Primary Care Pier Laux: [name] Other Clinician: Referring Jannel Lynne: Treating Shaquanda Graves/Extender: [name] in Treatment: 40 Active Problems Location of Pain Severity and Description of Pain Patient Has Paino No Site Locations Rate the pain. Current Pain Level: 0 DONEISHA, IVEY ([id number]) [id number] Page 6 of 9 Pain Management and Medication Current Pain Management: Electronic Signature(s) Signed: 10/30/2022 5:09:00 PM By: [name] Deed RN, BSN Entered By: [name] on 10/30/2022 11:23:17 -------------------------------------------------------------------------------- Patient/Caregiver Education  Details Patient Name: Date of Service: [name], [name] [name] 7/24/2024andnbsp11:15 A M Medical Record Number: [id number] Patient Account Number: [id number] Date of Birth/Gender: Treating RN: [birth year] (62 y.o. [name] Primary Care Physician: [name] Other Clinician: Referring Physician: Treating Physician/Extender: [name] in Treatment: 40 Education Assessment Education Provided To: Patient Education Topics Provided Venous: Methods: Explain/Verbal Responses: Reinforcements needed, State content correctly Wound/Skin Impairment: Methods: Explain/Verbal Responses: Reinforcements needed, State content correctly Electronic Signature(s) Signed: 10/30/2022 5:09:00 PM By: [name] Deed RN, BSN Entered By: [name] on 10/30/2022 11:36:24 -------------------------------------------------------------------------------- Wound Assessment Details Patient Name: Date of Service: [name], [name] 10/30/2022 11:15 A M Medical Record Number: [id number] Patient Account Number: [id number] Date of Birth/Sex: Treating RN: 1961-02-21 (62 y.o. [name] Primary Care Genevra Orne: [name] Other Clinician: Referring Kendrell [name]: Treating Aamirah Salmi/Extender: [name] in Treatment: 40 Wound Status Wound Number: 2 Primary Venous Leg Ulcer Etiology: Wound Location: Right, Posterior Lower Leg Wound Status: Healed - Epithelialized Wounding Event: Insect Bite Comorbid Anemia, Sleep Apnea, Hypertension, Peripheral Venous Date Acquired: 11/21/2021 History: Disease Weeks Of Treatment: 40 Clustered Wound: No Photos NOEMY, HALLMON ([id number]) [id number] Page 7 of 9 Wound Measurements Length: (cm) Width: (cm) Depth: (cm) Area: (cm) Volume: (cm) 0 % Reduction in Area: 100% 0 % Reduction in Volume: 100% 0 Epithelialization: Large (67-100%) 0 Tunneling: No 0 Undermining:  No Wound Description Classification: Full Thickness Without Exposed Support Structures Exudate Amount: None Present Foul Odor After Cleansing: No Slough/Fibrino No Wound Bed Granulation Amount: None Present (0%) Exposed Structure Necrotic Amount: None Present (0%) Fascia Exposed: No Fat Layer (Subcutaneous Tissue) Exposed: No Tendon Exposed: No Muscle Exposed: No Joint Exposed: No Bone Exposed: No Periwound Skin Texture Texture Color No Abnormalities Noted: Yes No Abnormalities Noted: No Atrophie Blanche: No Moisture  Ecchymosis: No No Abnormalities Noted: Yes Erythema: No Hemosiderin Staining: Yes Rubor: No Temperature / Pain Temperature: No Abnormality Electronic Signature(s) Signed: 10/30/2022 5:09:00 PM By: [name] Deed RN, BSN Entered By: [name] on 10/30/2022 11:34:25 -------------------------------------------------------------------------------- Wound Assessment Details Patient Name: Date of Service: [name], [name] 10/30/2022 11:15 A M Medical Record Number: [id number] Patient Account Number: [id number] Date of Birth/Sex: Treating RN: 11-10-1960 (62 y.o. [name] Primary Care Taylah Dubiel: [name] Other Clinician: Referring Ramsay Bognar: Treating Shalom Ware/Extender: [name] in Treatment: 40 Wound Status Wound Number: 3 Primary Venous Leg Ulcer Etiology: Wound Location: Left, Posterior Lower Leg Wound Status: Open Wounding Event: Trauma Comorbid Anemia, Sleep Apnea, Hypertension, Peripheral Venous Date Acquired: 06/28/2022 History: Disease Weeks Of Treatment: 12 Clustered Wound: No TIMIYA, HOWELLS ([id number]) [id number] Page 8 of 9 Photos Wound Measurements Length: (cm) 0.9 Width: (cm) 0.6 Depth: (cm) 0.1 Area: (cm) 0.424 Volume: (cm) 0.042 % Reduction in Area: 68% % Reduction in Volume: 68.4% Epithelialization: Small (1-33%) Tunneling: No Undermining: No Wound  Description Classification: Full Thickness Without Exposed Support Structures Wound Margin: Flat and Intact Exudate Amount: Small Exudate Type: Serosanguineous Exudate Color: red, brown Foul Odor After Cleansing: No Slough/Fibrino Yes Wound Bed Granulation Amount: Large (67-100%) Exposed Structure Granulation Quality: Red Fascia Exposed: No Necrotic Amount: Small (1-33%) Fat Layer (Subcutaneous Tissue) Exposed: Yes Necrotic Quality: Adherent Slough Tendon Exposed: No Muscle Exposed: No Joint Exposed: No Bone Exposed: No Periwound Skin Texture Texture Color No Abnormalities Noted: Yes No Abnormalities Noted: Yes Moisture Temperature / Pain No Abnormalities Noted: Yes Temperature: No Abnormality Tenderness on Palpation: Yes Treatment Notes Wound #3 (Lower Leg) Wound Laterality: Left, Posterior Cleanser Soap and Water Discharge Instruction: May shower and wash wound with dial antibacterial soap and water prior to dressing change. Wound Cleanser Discharge Instruction: Cleanse the wound with wound cleanser prior to applying a clean dressing using gauze sponges, not tissue or cotton balls. Peri-Wound Care Sween Lotion (Moisturizing lotion) Discharge Instruction: Apply moisturizing lotion as directed Topical Gentamicin Discharge Instruction: As directed by physician Mupirocin Ointment Discharge Instruction: Apply Mupirocin (Bactroban) as instructed Primary Dressing Promogran Prisma Matrix, 4.34 (sq in) (silver collagen) Discharge Instruction: Moisten collagen with saline or hydrogel Secondary Dressing Woven Gauze Sponge, Non-Sterile 4x4 in Discharge Instruction: Apply over primary dressing as directed. RADA, ZEGERS ([id number]) [id number] Page 9 of 9 Secured With Compression Wrap Urgo K2 Lite, (equivalent to a 3 layer) two layer compression system, regular Discharge Instruction: Apply Urgo K2 Lite as directed (alternative to 3 layer  compression). Compression Stockings Add-Ons Electronic Signature(s) Signed: 10/30/2022 5:09:00 PM By: [name] Deed RN, BSN Entered By: [name] on 10/30/2022 11:34:58 -------------------------------------------------------------------------------- Vitals Details Patient Name: Date of Service: [name], [name] 10/30/2022 11:15 A M Medical Record Number: [id number] Patient Account Number: [id number] Date of Birth/Sex: Treating RN: Mar 04, 1961 (62 y.o. [name] Primary Care Shaquanda Graves: [name] Other Clinician: Referring Charlynn Salih: Treating Demetrica Zipp/Extender: [name] in Treatment: 40 Vital Signs Time Taken: 11:22 Temperature (F): 98.3 Height (in): 61 Pulse (bpm): 87 Weight (lbs): 277 Respiratory Rate (breaths/min): 20 Body Mass Index (BMI): 52.3 Blood Pressure (mmHg): 117/83 Reference Range: 80 - 120 mg / dl Electronic Signature(s) Signed: 10/30/2022 5:09:00 PM By: [name] Deed RN, BSN Entered By: [name] on 10/30/2022 11:23:06

## 2022-11-07 ENCOUNTER — Telehealth: Payer: Self-pay | Admitting: Nurse Practitioner

## 2022-11-07 ENCOUNTER — Encounter (HOSPITAL_BASED_OUTPATIENT_CLINIC_OR_DEPARTMENT_OTHER): Payer: BC Managed Care – PPO | Attending: General Surgery | Admitting: General Surgery

## 2022-11-07 DIAGNOSIS — I10 Essential (primary) hypertension: Secondary | ICD-10-CM | POA: Insufficient documentation

## 2022-11-07 DIAGNOSIS — I872 Venous insufficiency (chronic) (peripheral): Secondary | ICD-10-CM | POA: Insufficient documentation

## 2022-11-07 DIAGNOSIS — G4730 Sleep apnea, unspecified: Secondary | ICD-10-CM | POA: Insufficient documentation

## 2022-11-07 DIAGNOSIS — R600 Localized edema: Secondary | ICD-10-CM | POA: Insufficient documentation

## 2022-11-07 DIAGNOSIS — Z6841 Body Mass Index (BMI) 40.0 and over, adult: Secondary | ICD-10-CM | POA: Insufficient documentation

## 2022-11-07 DIAGNOSIS — L97222 Non-pressure chronic ulcer of left calf with fat layer exposed: Secondary | ICD-10-CM | POA: Diagnosis present

## 2022-11-07 NOTE — Progress Notes (Signed)
LERAE, COUNCE ([id number]) [id number] Page 1 of 12 Visit Report for 11/07/2022 Chief Complaint Document Details Patient Name: Date of Service: [place], [name] 11/07/2022 1:00 PM Medical Record Number: [id number] Patient Account Number: [id number] Date of Birth/Sex: Treating RN: 1960-07-25 (62 y.o. F) Primary Care Provider: Nira Conn Other Clinician: Referring Provider: Treating Provider/Extender: Andrey Campanile in Treatment: 6 Information Obtained from: Patient Chief Complaint RLE ulcer Electronic Signature(s) Signed: 11/07/2022 1:26:42 PM By: [name] MD FACS Entered By: [name] on 11/07/2022 13:26:42 -------------------------------------------------------------------------------- Debridement Details Patient Name: Date of Service: [name], [name] 11/07/2022 1:00 PM Medical Record Number: [id number] Patient Account Number: [id number] Date of Birth/Sex: Treating RN: 09-06-1960 (62 y.o. [name] Primary Care Provider: Nira Conn Other Clinician: Referring Provider: Treating Provider/Extender: Andrey Campanile in Treatment: 41 Debridement Performed for Assessment: Wound #3 Left,Posterior Lower Leg Performed By: Physician [name], MD Debridement Type: Debridement Severity of Tissue Pre Debridement: Fat layer exposed Level of Consciousness (Pre-procedure): Awake and Alert Pre-procedure Verification/Time Out Yes - 13:10 Taken: Start Time: 13:15 Pain Control: Lidocaine 4% T opical Solution Percent of Wound Bed Debrided: 100% T Area Debrided (cm): otal 0.52 Tissue and other material debrided: Non-Viable, Slough, Slough Level: Non-Viable Tissue Debridement Description: Selective/Open Wound Instrument: Curette Bleeding: Minimum Hemostasis Achieved: Pressure Response [name] Treatment: Procedure was tolerated well Level of Consciousness (Post- Awake and  Alert procedure): Post Debridement Measurements of Total Wound Length: (cm) 1.1 Width: (cm) 0.6 Depth: (cm) 0.1 Volume: (cm) 0.052 Character of Wound/Ulcer Post Debridement: Improved Severity of Tissue Post Debridement: Fat layer exposed Post Procedure Diagnosis [name], [name] ([id number]) [id number] Page 2 of 12 Same as Pre-procedure Notes Scribed for Dr Lady Gary by Redmond Pulling, RN Electronic Signature(s) Signed: 11/07/2022 2:16:35 PM By: [name] MD FACS Signed: 11/07/2022 3:40:07 PM By: Redmond Pulling RN, BSN Entered By: Redmond Pulling on 11/07/2022 13:17:03 -------------------------------------------------------------------------------- HPI Details Patient Name: Date of Service: [name], [name] 11/07/2022 1:00 PM Medical Record Number: [id number] Patient Account Number: [id number] Date of Birth/Sex: Treating RN: January 13, 1961 (62 y.o. F) Primary Care Provider: Nira Conn Other Clinician: Referring Provider: Treating Provider/Extender: Andrey Campanile in Treatment: 58 History of Present Illness HPI Description: 02/16/2020 upon evaluation today patient actually appears [name] be doing poorly in regard [name] her left medial lower extremity ulcer. This is actually an area that she tells me she has had intermittent issues with over the years although has been closed for some time she typically uses compression right now she has juxta lite compression wraps. With that being said she tells me that this nonetheless open several weeks/months ago and has been given her trouble since. She does have a history of chronic venous insufficiency she is seeing specialist for this in the past she has had an ablation as well as sclerotherapy. With that being said she also has hypertension chronically which is managed by her primary care provider. In general she seems [name] be worsening overall with regard [name] the wound and states that she  finally realized that she needed [name] come in and have somebody look at this and not continue [name] try [name] manage this on her own. No fevers, chills, nausea, vomiting, or diarrhea. 02/23/2020 on evaluation today patient appears [name] be doing well with regard [name] her wound. This is showing some signs of improvement which is great news still were not quite at the point where I would like [name] be as far  as the overall appearance of the wound is concerned but I do believe this is better than last week. I do believe the Iodoflex is helping as well. 03/08/2020 upon evaluation today patient appears [name] be doing well with regard [name] her wound. She has been tolerating the dressing changes without complication. Fortunately I feel like she has made great progress with the Iodoflex but I feel like it may be the point rest [name] switch [name] something else possibly a collagen- based dressing at this time. 03/15/2020 upon evaluation today patient appears [name] be doing excellent in regard [name] her leg ulcer. She has been tolerating the dressing changes without complication. Fortunately there is no signs of active infection. Overall she is measuring a little bit smaller today which is great news. 03/22/2020 upon evaluation today patient appears [name] be doing well with regard [name] her wound. She has been tolerating the dressing changes without complication. Fortunately there is no signs of active infection at this time. 03/28/2020; patient I do not usually see however she has a wound on the left anterior lower leg secondary [name] chronic venous insufficiency we have been using silver collagen under compression. She arrives in clinic with a nonviable surface requiring debridement 04/12/2020 upon evaluation today patient appears [name] be doing well all things considered with regard [name] her leg ulcer. She is tolerating the dressing changes without complication there is minimal dry skin around the edges of the wound that may be trapping and stopping some of  the events of the new skin I am can work on that today. Otherwise the surface of the wound appears [name] be doing excellent. 04/19/2020 upon evaluation today patient appears [name] be doing well with regard [name] her leg ulcer. She has been tolerating dressing changes without complication. Fortunately there is no signs of active infection at this time. No fever chills noted overall very pleased with how things seem [name] be progressing. 04/26/2020 on evaluation today patient appears [name] be doing well with regard [name] her wound currently. Is showing signs of excellent improvement overall is filling in nicely and there does not appear [name] be any signs of infection. No fevers, chills, nausea, vomiting, or diarrhea. 05/03/2020 upon evaluation today patient appears [name] be doing well with regard [name] her wound on the leg. This overall showing signs of good improvement which is great she has some good epithelial growth and overall I think that things are moving in the correct direction. We likewise going [name] continue with the wound care measures as before since she seems [name] making such good improvement. 2/3; venous wound on the left medial leg. This is contracting. We are using Prisma and 3 layer compression. She has a stocking and waiting in the eventuality this heals. She is already using it on the right 05/17/2020 upon evaluation today patient appears [name] be doing well with regard [name] her leg ulcer. She has been tolerating the dressing changes without complication. Fortunately there is no signs of active infection which is great news and overall very pleased with where things stand today. No fevers, chills, nausea, vomiting, or diarrhea. 05/24/2020 upon evaluation today patient appears [name] be doing well with regard [name] her wound. Overall I feel like she is making excellent progress. There does not appear [name] be any signs of active infection which is great news. 05/31/2020 upon evaluation today patient appears [name] be doing well with  regard [name] her wound. There does not appear [name] be any signs of active infection which is great  news overall I am extremely pleased with where things stand today. READMISSION 01/23/2022 She returns [name] clinic today with a new wound on her right posterior calf. She says that she was cleaning out an old shed near the middle of August this year and then noticed what seemed [name] be a bug bite on her right posterior calf. It was itchy, red, and raised. By the end of September, an ulcer had developed. She has [name], [name] ([id number]) [id number] Page 3 of 12 been applying various topical creams such as hydrocortisone and others [name] the site. When it was not improving, she made an appointment in the wound care center. ABI in clinic today was 0.97. On her right posterior calf, there is a circular wound with necrotic fat and black eschar present. There is no purulent drainage or malodor. The periwound skin is in good condition with just a little induration that appears [name] be secondary [name] inflammation. 01/31/2022: The wound measures a little bit larger today, but overall is quite a bit cleaner. There is some undermining from 9:00 [name] 3:00. She is having some periwound itching and says that her wrap slid. 02/08/2022: Despite using an Unna boot first layer at the top of her wrap, it slid again and it looks like there is been some bruising at the wound site. The wound is about the same size in terms of dimensions. There is a fair amount of slough and nonviable tissue still present. Edema control is better than last week. 02/15/2022: The wound dimensions are about the same. There is less nonviable tissue present. The periwound erythema has improved. 02/22/2022: The wound has deteriorated over the past week. It is larger and the periwound is more edematous, erythematous, and indurated. It looks as though there has been more tissue breakdown with undermining present. She is having more pain.  There is a foul odor coming from the wound. 02/26/2022: The wound looks quite a bit better today and the odor is gone. I still do not have her culture data back, but she seems [name] be responding well [name] the Augmentin. 03/06/2022: Her wound continues [name] improve. There is still some slough accumulation, but the periwound skin is less inflamed. Her culture returned with a polymicrobial population including Pseudomonas and so levofloxacin was also prescribed. She did not understand why a second antibiotic was being added so she has not yet initiated this. 03/14/2022: The wound is about the same, [name] perhaps a little bit larger. There is a fair amount of slough accumulation on the surface, as well as some hypertrophic granulation tissue. She is still taking levofloxacin, but has completed taking Augmentin. She has her Jodie Echevaria compound with her today. 12/14; the patient has a significant circular wound on the posterior right calf. She is using Keystone and silver alginate under 3 layer compression. Her ABIs were within normal limits at 0.97. This may have been traumatic or an insect bite at the start I reviewed these records. 04/04/2022: Since I last saw the wound, it has contracted considerably. There is a fairly thick layer of slough on the surface, as it has not been debrided since the last time I did it. Edema control is excellent and the periwound skin is in much better condition. 04/12/2022: No significant change in the wound dimensions. It is filling with granulation tissue. Still with slough accumulation on the surface. 04/19/2022: The wound is smaller this week. There is some slough accumulation on the surface. 04/26/2022: The wound is smaller again this week and  significantly cleaner. The wound surface is a little bit drier than ideal. 05/03/2022: The wound measurements were about the same, but visually it appears smaller. There is still some undermining at the top of the wound. Moisture balance is  better this week. 05/10/2022: The wound measured smaller today. There is still a fair amount of undermining present. Slough has built up on the surface. We are still awaiting snap VAC approval. 05/17/2022: The wound is a little bit smaller today. There is less slough on the surface, but the granulation tissue still is not very robust. She has been approved for snap VAC but will have a 20% coinsurance and it is not clear what that amount would end up being for her. 05/27/2022: The surface of the wound has deteriorated and it is gray and fibrotic. No significant odor, but the drainage on her dressing was a little bit purulent. 05/31/2022: The wound looks quite a bit better today. It is still a little fibrotic but no longer has purulent-looking drainage. The color is better. She spoke with her insurance company and is interested in trying the snap VAC, now that she is aware of the cost [name] her. 06/07/2022: After 1 week in the snap VAC, there has been substantial improvement [name] her wound. The undermined portion is closing in. The surface has a healthier color and appearance. There is very minimal slough accumulation. 06/14/2022: The more shallow part of the undermined portion of her wound has closed. There is still some undermining from about 1-2 o'clock. The surface continues [name] improve. Minimal slough accumulation. 06/28/2022: The wound is shallower this week and the undermining has essentially closed and completely. The surface tissue is improving. 07/05/2022: The depth is almost immeasurable and the surface is nearly flush with the surrounding skin. The undermining has been eliminated. There is light slough on the wound surface. 07/12/2022: There is a little bit of slough on the wound surface. The depth is about the same as last week. 07/19/2022: The wound is essentially flush with the surrounding skin surface. There is slight slough present. No real change in the AP or transverse dimensions. 07/26/2022: The wound  measured a little bit smaller today. It is flush with the surrounding skin surface and there is good granulation tissue present. Minimal slough and biofilm accumulation. 08/02/2022: The right posterior leg wound is a little bit smaller. There is a little slough accumulation. She reported a new wound [name] [place] today. It is on her left posterior calf. It has been present for about 6 weeks. She is not sure of how it occurred. She has been trying [name] manage it at home with topical Neosporin and Band-Aids. The fat layer is exposed. The surface is fibrotic and covered with slough. 08/09/2022: The right posterior leg wound is smaller again this week. There is good granulation tissue on the surface with minimal slough accumulation. The left posterior calf wound has built up a thick layer of slough. It is still quite fibrotic underneath the slough, but there is a little bit of a pink color beginning [name] emerge. Edema control is good. 08/16/2022: Both wounds are smaller this week. There is minimal slough on the right posterior leg wound. There is slough that has reaccumulated on the left posterior calf wound but there is a little bit more granulation tissue emerging. 08/23/2022: Both wounds are about the same size this week, but the quality of the tissue and amount of granulation tissue on the left are both better. 08/30/2022: Both wounds measure smaller  today, but there has been some tissue breakdown adjacent [name] the left leg wound. She says it has been itchy. There is a little bit of slough on the surface of both wounds. 09/06/2022: The right wound measures smaller today. There is minimal slough on the wound surface. The left wound is about the same size. The periwound tissue looks better this week. There is still a layer of fibrinous slough on the left wound. Edema control is good bilaterally. 6/7 the patient has wounds on bilateral calfs. We are using silver alginate on the right Iodoflex on the left under Urgo lite  compression. The wounds are measuring smaller. 09/20/2022: The right wound is flush with the surrounding skin. There is good granulation tissue with some biofilm and thin eschar present. On the left, there is [name], [name] ([id number]) [id number] Page 4 of 12 some slough accumulation and the patient says it has been a little itchy this week. 09/27/2022: No significant change [name] the right wound. The left wound measures a little smaller, but still has some depth [name] it with slough accumulation. 10/04/2022: Nice improvement in both wounds. They are smaller and more superficial. The left wound has better granulation tissue and no longer has the hard fibrous surface. 10/11/2022: Both wounds continue [name] contract. There is minimal slough and eschar present. Granulation tissue continues [name] fill in on the left. Edema control is good. 10/21/2022: The right leg wound is about half the size as it was last week. There is a little perimeter eschar and some light slough on the surface. The left leg wound is about the same size, but shallower. There is still slough accumulation but the buds of granulation tissue are getting larger. Edema control remains excellent. 10/30/2022: The right leg wound is healed. The left leg wound is smaller with better granulation tissue and only a little bit of slough present. Edema control is excellent. 11/07/2022: The left leg wound continues [name] contract and the surface continues [name] improve. Minimal slough with good granulation tissue filling in. Electronic Signature(s) Signed: 11/07/2022 1:27:14 PM By: [name] MD FACS Entered By: [name] on 11/07/2022 13:27:14 -------------------------------------------------------------------------------- Physical Exam Details Patient Name: Date of Service: [name], [name] 11/07/2022 1:00 PM Medical Record Number: [id number] Patient Account Number: [id number] Date of Birth/Sex: Treating RN: 03-28-1961  (62 y.o. F) Primary Care Provider: Nira Conn Other Clinician: Referring Provider: Treating Provider/Extender: Andrey Campanile in Treatment: 41 Constitutional . . . . no acute distress. Respiratory Normal work of breathing on room air. Notes 11/07/2022: The left leg wound continues [name] contract and the surface continues [name] improve. Minimal slough with good granulation tissue filling in. Electronic Signature(s) Signed: 11/07/2022 1:27:49 PM By: [name] MD FACS Entered By: [name] on 11/07/2022 13:27:49 -------------------------------------------------------------------------------- Physician Orders Details Patient Name: Date of Service: [name], [name] 11/07/2022 1:00 PM Medical Record Number: [id number] Patient Account Number: [id number] Date of Birth/Sex: Treating RN: 08-22-1960 (62 y.o. 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 - Dr. Lady Gary RM 3 Return A Thursday 8/7 @ 11:45 pm Anesthetic (In clinic) Topical Lidocaine  4% applied [name] wound bed Bathing/ Shower/ Hygiene May shower with protection but do not get wound dressing(s) wet. Protect dressing(s) with water repellant cover (for example, large plastic bag) or a cast cover and may then take shower. Edema Control - Lymphedema / SCD / Other Bilateral Lower Extremities Elevate legs [name] the level of the heart or above for 30 minutes daily and/or when sitting for  3-4 times a day throughout the day. Avoid standing for long periods of time. Exercise regularly Moisturize legs daily. Compression stocking or Garment 20-30 mm/Hg pressure [name]: - right leg daily Wound Treatment Wound #3 - Lower Leg Wound Laterality: Left, Posterior Cleanser: Soap and Water 1 x Per Week/30 Days Discharge Instructions: May shower and wash wound with dial antibacterial soap and water prior [name] dressing change. Cleanser: Wound Cleanser 1 x Per Week/30 Days Discharge Instructions: Cleanse the wound with wound cleanser prior [name] applying a clean dressing using gauze sponges, not tissue or cotton balls. Peri-Wound Care: Sween Lotion (Moisturizing lotion) 1 x Per Week/30 Days Discharge Instructions: Apply moisturizing lotion as directed Topical: Gentamicin 1 x Per Week/30 Days Discharge Instructions: As directed by physician Topical: Mupirocin Ointment 1 x Per Week/30 Days Discharge Instructions: Apply Mupirocin (Bactroban) as instructed Prim Dressing: Promogran Prisma Matrix, 4.34 (sq in) (silver collagen) 1 x Per Week/30 Days ary Discharge Instructions: Moisten collagen with saline or hydrogel Secondary Dressing: Woven Gauze Sponge, Non-Sterile 4x4 in 1 x Per Week/30 Days Discharge Instructions: Apply over primary dressing as directed. Compression Wrap: Urgo K2 Lite, (equivalent [name] a 3 layer) two layer compression system, regular 1 x Per Week/30 Days Discharge Instructions: Apply Urgo K2 Lite as directed (alternative [name] 3 layer compression). Electronic Signature(s) Signed: 11/07/2022 2:16:35 PM By: [name] MD FACS Entered By: [name] on 11/07/2022 13:28:03 -------------------------------------------------------------------------------- Problem List Details Patient Name: Date of Service: [name], [name] 11/07/2022 1:00 PM Medical Record Number: [id number] Patient Account Number: [id number] Date of Birth/Sex: Treating RN: Aug 22, 1960 (62 y.o. F) Primary Care  Provider: Nira Conn Other Clinician: Referring Provider: Treating Provider/Extender: Andrey Campanile in Treatment: [address] Active Problems ICD-10 Encounter CALEEN, SNOWMAN ([id number]) [id number] Page 6 of 12 Encounter Code Description Active Date MDM Diagnosis L97.222 Non-pressure chronic ulcer of left calf with fat layer exposed 08/02/2022 No Yes E66.01 Morbid (severe) obesity due [name] excess calories 01/23/2022 No Yes I10 Essential (primary) hypertension 01/23/2022 No Yes R60.0 Localized edema 01/23/2022 No Yes Inactive Problems Resolved Problems ICD-10 Code Description Active Date Resolved Date L97.212 Non-pressure chronic ulcer of right calf with fat layer exposed 01/23/2022 01/23/2022 Electronic Signature(s) Signed: 11/07/2022 1:26:15 PM By: [name] MD FACS Entered By: [name] on 11/07/2022 13:26:15 -------------------------------------------------------------------------------- Progress Note Details Patient Name: Date of Service: [name], [name] 11/07/2022 1:00 PM Medical Record Number: [id number] Patient Account Number: [id number] Date of Birth/Sex: Treating RN: 19-Aug-1960 (62 y.o. F) Primary Care Provider: Nira Conn Other Clinician: Referring Provider: Treating Provider/Extender: Andrey Campanile in Treatment: 46 Subjective Chief Complaint Information obtained from Patient RLE ulcer History of Present Illness (HPI) 02/16/2020 upon evaluation today patient actually appears [name] be doing poorly in regard [name] her left medial lower extremity ulcer. This is actually an area that she tells me she has had intermittent issues with over the years although has been closed for some time she typically uses compression right now she has juxta lite compression wraps. With that being said she tells me that this nonetheless open several weeks/months  ago and has been given her trouble  since. She does have a history of chronic venous insufficiency she is seeing specialist for this in the past she has had an ablation as well as sclerotherapy. With that being said she also has hypertension chronically which is managed by her primary care provider. In general she seems [name] be worsening overall with regard [name] the wound and states that she finally realized that she needed [name] come in and have somebody look at this and not continue [name] try [name] manage this on her own. No fevers, chills, nausea, vomiting, or diarrhea. 02/23/2020 on evaluation today patient appears [name] be doing well with regard [name] her wound. This is showing some signs of improvement which is great news still were not quite at the point where I would like [name] be as far as the overall appearance of the wound is concerned but I do believe this is better than last week. I do believe the Iodoflex is helping as well. 03/08/2020 upon evaluation today patient appears [name] be doing well with regard [name] her wound. She has been tolerating the dressing changes without complication. Fortunately I feel like she has made great progress with the Iodoflex but I feel like it may be the point rest [name] switch [name] something else possibly a collagen- based dressing at this time. 03/15/2020 upon evaluation today patient appears [name] be doing excellent in regard [name] her leg ulcer. She has been tolerating the dressing changes without complication. Fortunately there is no signs of active infection. Overall she is measuring a little bit smaller today which is great news. 03/22/2020 upon evaluation today patient appears [name] be doing well with regard [name] her wound. She has been tolerating the dressing changes without complication. Fortunately there is no signs of active infection at this time. 03/28/2020; patient I do not usually see however she has a wound on the left anterior lower leg secondary [name] chronic venous insufficiency we have been using [name], [name]  ([id number]) [phone].pdf Page 7 of 12 silver collagen under compression. She arrives in clinic with a nonviable surface requiring debridement 04/12/2020 upon evaluation today patient appears [name] be doing well all things considered with regard [name] her leg ulcer. She is tolerating the dressing changes without complication there is minimal dry skin around the edges of the wound that may be trapping and stopping some of the events of the new skin I am can work on that today. Otherwise the surface of the wound appears [name] be doing excellent. 04/19/2020 upon evaluation today patient appears [name] be doing well with regard [name] her leg ulcer. She has been tolerating dressing changes without complication. Fortunately there is no signs of active infection at this time. No fever chills noted overall very pleased with how things seem [name] be progressing. 04/26/2020 on evaluation today patient appears [name] be doing well with regard [name] her wound currently. Is showing signs of excellent improvement overall is filling in nicely and there does not appear [name] be any signs of infection. No fevers, chills, nausea, vomiting, or diarrhea. 05/03/2020 upon evaluation today patient appears [name] be doing well with regard [name] her wound on the leg. This overall showing signs of good improvement which is great she has some good epithelial growth and overall I think that things are moving in the correct direction. We likewise going [name] continue with the wound care measures as before since she seems [name] making such good improvement. 2/3; venous wound on the left medial leg.  This is contracting. We are using Prisma and 3 layer compression. She has a stocking and waiting in the eventuality this heals. She is already using it on the right 05/17/2020 upon evaluation today patient appears [name] be doing well with regard [name] her leg ulcer. She has been tolerating the dressing changes without complication. Fortunately there is no signs of  active infection which is great news and overall very pleased with where things stand today. No fevers, chills, nausea, vomiting, or diarrhea. 05/24/2020 upon evaluation today patient appears [name] be doing well with regard [name] her wound. Overall I feel like she is making excellent progress. There does not appear [name] be any signs of active infection which is great news. 05/31/2020 upon evaluation today patient appears [name] be doing well with regard [name] her wound. There does not appear [name] be any signs of active infection which is great news overall I am extremely pleased with where things stand today. READMISSION 01/23/2022 She returns [name] clinic today with a new wound on her right posterior calf. She says that she was cleaning out an old shed near the middle of August this year and then noticed what seemed [name] be a bug bite on her right posterior calf. It was itchy, red, and raised. By the end of September, an ulcer had developed. She has been applying various topical creams such as hydrocortisone and others [name] the site. When it was not improving, she made an appointment in the wound care center. ABI in clinic today was 0.97. On her right posterior calf, there is a circular wound with necrotic fat and black eschar present. There is no purulent drainage or malodor. The periwound skin is in good condition with just a little induration that appears [name] be secondary [name] inflammation. 01/31/2022: The wound measures a little bit larger today, but overall is quite a bit cleaner. There is some undermining from 9:00 [name] 3:00. She is having some periwound itching and says that her wrap slid. 02/08/2022: Despite using an Unna boot first layer at the top of her wrap, it slid again and it looks like there is been some bruising at the wound site. The wound is about the same size in terms of dimensions. There is a fair amount of slough and nonviable tissue still present. Edema control is better than last week. 02/15/2022: The  wound dimensions are about the same. There is less nonviable tissue present. The periwound erythema has improved. 02/22/2022: The wound has deteriorated over the past week. It is larger and the periwound is more edematous, erythematous, and indurated. It looks as though there has been more tissue breakdown with undermining present. She is having more pain. There is a foul odor coming from the wound. 02/26/2022: The wound looks quite a bit better today and the odor is gone. I still do not have her culture data back, but she seems [name] be responding well [name] the Augmentin. 03/06/2022: Her wound continues [name] improve. There is still some slough accumulation, but the periwound skin is less inflamed. Her culture returned with a polymicrobial population including Pseudomonas and so levofloxacin was also prescribed. She did not understand why a second antibiotic was being added so she has not yet initiated this. 03/14/2022: The wound is about the same, [name] perhaps a little bit larger. There is a fair amount of slough accumulation on the surface, as well as some hypertrophic granulation tissue. She is still taking levofloxacin, but has completed taking Augmentin. She has her Jodie Echevaria compound with  her today. 12/14; the patient has a significant circular wound on the posterior right calf. She is using Keystone and silver alginate under 3 layer compression. Her ABIs were within normal limits at 0.97. This may have been traumatic or an insect bite at the start I reviewed these records. 04/04/2022: Since I last saw the wound, it has contracted considerably. There is a fairly thick layer of slough on the surface, as it has not been debrided since the last time I did it. Edema control is excellent and the periwound skin is in much better condition. 04/12/2022: No significant change in the wound dimensions. It is filling with granulation tissue. Still with slough accumulation on the surface. 04/19/2022: The wound is smaller  this week. There is some slough accumulation on the surface. 04/26/2022: The wound is smaller again this week and significantly cleaner. The wound surface is a little bit drier than ideal. 05/03/2022: The wound measurements were about the same, but visually it appears smaller. There is still some undermining at the top of the wound. Moisture balance is better this week. 05/10/2022: The wound measured smaller today. There is still a fair amount of undermining present. Slough has built up on the surface. We are still awaiting snap VAC approval. 05/17/2022: The wound is a little bit smaller today. There is less slough on the surface, but the granulation tissue still is not very robust. She has been approved for snap VAC but will have a 20% coinsurance and it is not clear what that amount would end up being for her. 05/27/2022: The surface of the wound has deteriorated and it is gray and fibrotic. No significant odor, but the drainage on her dressing was a little bit purulent. 05/31/2022: The wound looks quite a bit better today. It is still a little fibrotic but no longer has purulent-looking drainage. The color is better. She spoke with her insurance company and is interested in trying the snap VAC, now that she is aware of the cost [name] her. 06/07/2022: After 1 week in the snap VAC, there has been substantial improvement [name] her wound. The undermined portion is closing in. The surface has a healthier color and appearance. There is very minimal slough accumulation. 06/14/2022: The more shallow part of the undermined portion of her wound has closed. There is still some undermining from about 1-2 o'clock. The surface continues [name] improve. Minimal slough accumulation. 06/28/2022: The wound is shallower this week and the undermining has essentially closed and completely. The surface tissue is improving. 07/05/2022: The depth is almost immeasurable and the surface is nearly flush with the surrounding skin. The undermining  has been eliminated. There is light [name], [name] ([id number]) [phone].pdf Page 8 of 12 slough on the wound surface. 07/12/2022: There is a little bit of slough on the wound surface. The depth is about the same as last week. 07/19/2022: The wound is essentially flush with the surrounding skin surface. There is slight slough present. No real change in the AP or transverse dimensions. 07/26/2022: The wound measured a little bit smaller today. It is flush with the surrounding skin surface and there is good granulation tissue present. Minimal slough and biofilm accumulation. 08/02/2022: The right posterior leg wound is a little bit smaller. There is a little slough accumulation. She reported a new wound [name] [place] today. It is on her left posterior calf. It has been present for about 6 weeks. She is not sure of how it occurred. She has been trying [name] manage it  at home with topical Neosporin and Band-Aids. The fat layer is exposed. The surface is fibrotic and covered with slough. 08/09/2022: The right posterior leg wound is smaller again this week. There is good granulation tissue on the surface with minimal slough accumulation. The left posterior calf wound has built up a thick layer of slough. It is still quite fibrotic underneath the slough, but there is a little bit of a pink color beginning [name] emerge. Edema control is good. 08/16/2022: Both wounds are smaller this week. There is minimal slough on the right posterior leg wound. There is slough that has reaccumulated on the left posterior calf wound but there is a little bit more granulation tissue emerging. 08/23/2022: Both wounds are about the same size this week, but the quality of the tissue and amount of granulation tissue on the left are both better. 08/30/2022: Both wounds measure smaller today, but there has been some tissue breakdown adjacent [name] the left leg wound. She says it has been itchy. There is a little bit of slough on  the surface of both wounds. 09/06/2022: The right wound measures smaller today. There is minimal slough on the wound surface. The left wound is about the same size. The periwound tissue looks better this week. There is still a layer of fibrinous slough on the left wound. Edema control is good bilaterally. 6/7 the patient has wounds on bilateral calfs. We are using silver alginate on the right Iodoflex on the left under Urgo lite compression. The wounds are measuring smaller. 09/20/2022: The right wound is flush with the surrounding skin. There is good granulation tissue with some biofilm and thin eschar present. On the left, there is some slough accumulation and the patient says it has been a little itchy this week. 09/27/2022: No significant change [name] the right wound. The left wound measures a little smaller, but still has some depth [name] it with slough accumulation. 10/04/2022: Nice improvement in both wounds. They are smaller and more superficial. The left wound has better granulation tissue and no longer has the hard fibrous surface. 10/11/2022: Both wounds continue [name] contract. There is minimal slough and eschar present. Granulation tissue continues [name] fill in on the left. Edema control is good. 10/21/2022: The right leg wound is about half the size as it was last week. There is a little perimeter eschar and some light slough on the surface. The left leg wound is about the same size, but shallower. There is still slough accumulation but the buds of granulation tissue are getting larger. Edema control remains excellent. 10/30/2022: The right leg wound is healed. The left leg wound is smaller with better granulation tissue and only a little bit of slough present. Edema control is excellent. 11/07/2022: The left leg wound continues [name] contract and the surface continues [name] improve. Minimal slough with good granulation tissue filling in. Patient History Information obtained from Patient. Family  History Cancer - Mother, Diabetes - Father, Hypertension - Mother, Kidney Disease - Mother, Lung Disease - Father, Thyroid Problems - Paternal Grandparents, No family history of Heart Disease, Seizures, Stroke, Tuberculosis. Social History Never smoker, Marital Status - Married, Alcohol Use - Never, Drug Use - No History, Caffeine Use - Daily - coffee. Medical History Eyes Denies history of Cataracts, Glaucoma, Optic Neuritis Ear/Nose/Mouth/Throat Denies history of Chronic sinus problems/congestion, Middle ear problems Hematologic/Lymphatic Patient has history of Anemia - iron Denies history of Hemophilia, Human Immunodeficiency Virus, Lymphedema, Sickle Cell Disease Respiratory Patient has history of Sleep Apnea -  CPAP Denies history of Aspiration, Asthma, Chronic Obstructive Pulmonary Disease (COPD), Pneumothorax, Tuberculosis Cardiovascular Patient has history of Hypertension, Peripheral Venous Disease Denies history of Angina, Arrhythmia, Congestive Heart Failure, Coronary Artery Disease, Deep Vein Thrombosis, Hypotension, Myocardial Infarction, Peripheral Arterial Disease, Phlebitis, Vasculitis Gastrointestinal Denies history of Cirrhosis , Colitis, Crohns, Hepatitis A, Hepatitis B, Hepatitis C Endocrine Denies history of Type I Diabetes, Type II Diabetes Genitourinary Denies history of End Stage Renal Disease Immunological Denies history of Lupus Erythematosus, Raynauds, Scleroderma Integumentary (Skin) Denies history of History of Burn Musculoskeletal Denies history of Gout, Rheumatoid Arthritis, Osteoarthritis, Osteomyelitis Neurologic Denies history of Dementia, Neuropathy, Quadriplegia, Paraplegia, Seizure Disorder Oncologic [name], [name] ([id number]) [id number] Page 9 of 12 Denies history of Received Chemotherapy, Received Radiation Psychiatric Denies history of Anorexia/bulimia, Confinement Anxiety Hospitalization/Surgery History -  cholecystectomy 1980s. - nephrolithasis 1980s. - plate and rod in right elbow surgery 2010. 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 Vitals Time Taken: 1:00 PM, Height: 61 in, Weight: 277 lbs, BMI: 52.3, Temperature: 98.2 F, Pulse: 99 bpm, Respiratory Rate: 18 breaths/min, Blood Pressure: 129/81 mmHg. Respiratory Normal work of breathing on room air. General Notes: 11/07/2022: The left leg wound continues [name] contract and the surface continues [name] improve. Minimal slough with good granulation tissue filling in. Integumentary (Hair, Skin) Wound #3 status is Open. Original cause of wound was Trauma. The date acquired was: 06/28/2022. The wound has been in treatment 13 weeks. The wound is located on the Left,Posterior Lower Leg. The wound measures 1.1cm length x 0.6cm width x 0.1cm depth; 0.518cm^2 area and 0.052cm^3 volume. There is Fat Layer (Subcutaneous Tissue) exposed. There is no tunneling or undermining noted. There is a small amount of serosanguineous drainage noted. The wound margin is flat and intact. There is large (67-100%) red granulation within the wound bed. There is a small (1-33%) amount of necrotic tissue within the wound bed including Adherent Slough. The periwound skin appearance had no abnormalities noted for texture. The periwound skin appearance had no abnormalities noted for moisture. The periwound skin appearance had no abnormalities noted for color. Periwound temperature was noted as No Abnormality. The periwound has tenderness on palpation. Assessment Active Problems ICD-10 Non-pressure chronic ulcer of left calf with fat layer exposed Morbid (severe) obesity due [name] excess calories Essential (primary) hypertension Localized edema Procedures Wound #3 Pre-procedure diagnosis of Wound #3 is a Venous Leg Ulcer located on the Left,Posterior Lower Leg  .Severity of Tissue Pre Debridement is: Fat layer exposed. There was a Selective/Open Wound Non-Viable Tissue Debridement with a total area of 0.52 sq cm performed by [name], MD. With the following instrument(s): Curette [name] remove Non-Viable tissue/material. Material removed includes [facility] after achieving pain control using Lidocaine 4% Topical Solution. No specimens were taken. A time out was conducted at 13:10, prior [name] the start of the procedure. A Minimum amount of bleeding was controlled with Pressure. The procedure was tolerated well. Post Debridement Measurements: 1.1cm length x 0.6cm width x 0.1cm depth; 0.052cm^3 volume. Character of Wound/Ulcer Post Debridement is improved. Severity of Tissue Post Debridement is: Fat layer exposed. Post procedure Diagnosis Wound #3: Same as Pre-Procedure General Notes: Scribed for Dr Lady Gary by Redmond Pulling, RN. Plan Follow-up Appointments: Return Appointment in 1 week. - Dr. Lady Gary RM 3 Thursday 8/7 @ 11:45 pm Anesthetic: (In clinic) Topical Lidocaine 4% applied [name] wound bed Bathing/ Shower/ Hygiene: [name], [name] ([id number]) [id number]  Page 10 of 12 May shower with protection but do not get wound dressing(s) wet. Protect dressing(s) with water repellant cover (for example, large plastic bag) or a cast cover and may then take shower. Edema Control - Lymphedema / SCD / Other: Elevate legs [name] the level of the heart or above for 30 minutes daily and/or when sitting for 3-4 times a day throughout the day. Avoid standing for long periods of time. Exercise regularly Moisturize legs daily. Compression stocking or Garment 20-30 mm/Hg pressure [name]: - right leg daily WOUND #3: - Lower Leg Wound Laterality: Left, Posterior Cleanser: Soap and Water 1 x Per Week/30 Days Discharge Instructions: May shower and wash wound with dial antibacterial soap and water prior [name] dressing change. Cleanser: Wound Cleanser 1 x Per  Week/30 Days Discharge Instructions: Cleanse the wound with wound cleanser prior [name] applying a clean dressing using gauze sponges, not tissue or cotton balls. Peri-Wound Care: Sween Lotion (Moisturizing lotion) 1 x Per Week/30 Days Discharge Instructions: Apply moisturizing lotion as directed Topical: Gentamicin 1 x Per Week/30 Days Discharge Instructions: As directed by physician Topical: Mupirocin Ointment 1 x Per Week/30 Days Discharge Instructions: Apply Mupirocin (Bactroban) as instructed Prim Dressing: Promogran Prisma Matrix, 4.34 (sq in) (silver collagen) 1 x Per Week/30 Days ary Discharge Instructions: Moisten collagen with saline or hydrogel Secondary Dressing: Woven Gauze Sponge, Non-Sterile 4x4 in 1 x Per Week/30 Days Discharge Instructions: Apply over primary dressing as directed. Com pression Wrap: Urgo K2 Lite, (equivalent [name] a 3 layer) two layer compression system, regular 1 x Per Week/30 Days Discharge Instructions: Apply Urgo K2 Lite as directed (alternative [name] 3 layer compression). 11/07/2022: The left leg wound continues [name] contract and the surface continues [name] improve. Minimal slough with good granulation tissue filling in. I used a curette [name] debride the slough from her wound. We will continue the mixture of topical gentamicin and mupirocin with Prisma silver collagen and Urgo light compression. Follow-up in 1 week. Electronic Signature(s) Signed: 11/07/2022 1:29:26 PM By: [name] MD FACS Entered By: [name] on 11/07/2022 13:29:26 -------------------------------------------------------------------------------- HxROS Details Patient Name: Date of Service: [name], [name] 11/07/2022 1:00 PM Medical Record Number: [id number] Patient Account Number: [id number] Date of Birth/Sex: Treating RN: [birth year] (62 y.o. F) Primary Care Provider: Nira Conn Other Clinician: Referring Provider: Treating Provider/Extender: Andrey Campanile in Treatment: 51 Information Obtained From Patient Eyes Medical History: Negative for: Cataracts; Glaucoma; Optic Neuritis Ear/Nose/Mouth/Throat Medical History: Negative for: Chronic sinus problems/congestion; Middle ear problems Hematologic/Lymphatic Medical History: Positive for: Anemia - iron Negative for: Hemophilia; Human Immunodeficiency Virus; Lymphedema; Sickle Cell Disease Respiratory Medical History: Positive for: Sleep Apnea - CPAP Negative for: Aspiration; Asthma; Chronic Obstructive Pulmonary Disease (COPD); Pneumothorax; Tuberculosis [name], [name] ([id number]) [id number] Page 11 of 12 Cardiovascular Medical History: Positive for: Hypertension; Peripheral Venous Disease Negative for: Angina; Arrhythmia; Congestive Heart Failure; Coronary Artery Disease; Deep Vein Thrombosis; Hypotension; Myocardial Infarction; Peripheral Arterial Disease; Phlebitis; Vasculitis Gastrointestinal Medical History: Negative for: Cirrhosis ; Colitis; Crohns; Hepatitis A; Hepatitis B; Hepatitis C Endocrine Medical History: Negative for: Type I Diabetes; Type II Diabetes Genitourinary Medical History: Negative for: End Stage Renal Disease Past Medical History Notes: years ago kidney stones Immunological Medical History: Negative for: Lupus Erythematosus; Raynauds; Scleroderma Integumentary (Skin) Medical History: Negative for: History of Burn Musculoskeletal Medical History: Negative for: Gout; Rheumatoid Arthritis; Osteoarthritis; Osteomyelitis Neurologic Medical History: Negative for: Dementia; Neuropathy; Quadriplegia; Paraplegia; Seizure Disorder Oncologic Medical History: Negative for: Received Chemotherapy; Received  Radiation Past Medical History Notes: skin Ca removed from back years ago Psychiatric Medical History: Negative for: Anorexia/bulimia; Confinement Anxiety Immunizations Pneumococcal Vaccine: Received  Pneumococcal Vaccination: No Implantable Devices None Hospitalization / Surgery History Type of Hospitalization/Surgery cholecystectomy 1980s nephrolithasis 1980s plate and rod in right elbow surgery 2010 Family and Social History Cancer: Yes - Mother; Diabetes: Yes - Father; Heart Disease: No; Hypertension: Yes - Mother; Kidney Disease: Yes - Mother; Lung Disease: Yes - Father; Seizures: No; Stroke: No; Thyroid Problems: Yes - Paternal Grandparents; Tuberculosis: No; Never smoker; Marital Status - Married; Alcohol Use: Never; Drug Use: No History; Caffeine Use: Daily - coffee; Financial Concerns: No; Food, Clothing or Shelter Needs: No; Support System Lacking: No; Transportation Concerns: No Electronic Signature(s) SHAMYIAH, KAEO ([id number]) [id number] Page 12 of 12 Signed: 11/07/2022 2:16:35 PM By: [name] MD FACS Entered By: [name] on 11/07/2022 13:27:23 -------------------------------------------------------------------------------- SuperBill Details Patient Name: Date of Service: [address], [name] 11/07/2022 Medical Record Number: [id number] Patient Account Number: [id number] Date of Birth/Sex: Treating RN: 12/03/1960 (62 y.o. 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.01 Morbid (severe) obesity due [name] excess calories I10 Essential (primary) hypertension R60.0 Localized edema Facility Procedures : CPT4 Code: [id number] Description: 97597 - DEBRIDE WOUND 1ST 20 SQ CM OR < ICD-10 Diagnosis Description L97.222 Non-pressure chronic ulcer of left calf with fat layer exposed Modifier: Quantity: 1 Physician Procedures : CPT4 Code Description Modifier [id number] 99214 - WC PHYS LEVEL 4 - EST PT  25 ICD-10 Diagnosis Description L97.222 Non-pressure chronic ulcer of left calf with fat layer exposed I10 Essential (primary) hypertension R60.0 Localized edema E66.01 Morbid  (severe) obesity due [name] excess calories Quantity: 1 : [id number] 97597 - WC PHYS DEBR WO ANESTH 20 SQ CM ICD-10 Diagnosis Description L97.222 Non-pressure chronic ulcer of left calf with fat layer exposed Quantity: 1 Electronic Signature(s) Signed: 11/07/2022 1:29:49 PM By: [name] MD FACS Entered By: [name] on 11/07/2022 13:29:49

## 2022-11-07 NOTE — Telephone Encounter (Signed)
Pt. Having issues with possible side effects from the inhaler and she's on breo and she has been rinsing her mouth out but needs med advise

## 2022-11-07 NOTE — Progress Notes (Signed)
WINDEE, BURGDORF ([id number]) [id number] Page 1 of 7 Visit Report for 11/07/2022 Arrival Information Details Patient Name: Date of Service: [place], [name] [name] H. 11/07/2022 1:00 PM Medical Record Number: [id number] Patient Account Number: [id number] Date of Birth/Sex: Treating RN: 01-Dec-1960 (62 y.o. [name] Primary Care Edker Punt: Nira Conn Other Clinician: Referring Tachina Spoonemore: Treating Jacarie Pate/Extender: Andrey Campanile in Treatment: 41 Visit Information History Since Last Visit Added or deleted any medications: No Patient Arrived: Ambulatory Any new allergies or adverse reactions: No Arrival Time: 12:59 Had a fall or experienced change in No Accompanied By: self activities of daily living that may affect Transfer Assistance: None risk of falls: Patient Identification Verified: Yes Signs or symptoms of abuse/neglect since last visito No Secondary Verification Process Completed: Yes Hospitalized since last visit: No Patient Requires Transmission-Based Precautions: No Implantable device outside of the clinic excluding No Patient Has Alerts: No cellular tissue based products placed in the center since last visit: Has Dressing in Place as Prescribed: Yes Has Compression in Place as Prescribed: Yes Pain Present Now: No Electronic Signature(s) Signed: 11/07/2022 3:40:07 PM By: Redmond Pulling RN, BSN Entered By: Redmond Pulling on 11/07/2022 12:59:27 -------------------------------------------------------------------------------- Encounter Discharge Information Details Patient Name: Date of Service: [address], [name] [name] H. 11/07/2022 1:00 PM Medical Record Number: [id number] Patient Account Number: [id number] Date of Birth/Sex: Treating RN: 03-16-1961 (62 y.o. [name] Primary Care Tyjuan Demetro: Nira Conn Other Clinician: Referring Jacquese Hackman: Treating Aniza Shor/Extender: Andrey Campanile in  Treatment: 50 Encounter Discharge Information Items Post Procedure Vitals Discharge Condition: Stable Temperature (F): 98.2 Ambulatory Status: Ambulatory Pulse (bpm): 99 Discharge Destination: Home Respiratory Rate (breaths/min): 18 Transportation: Private Auto Blood Pressure (mmHg): 129/81 Accompanied By: self Schedule Follow-up Appointment: Yes Clinical Summary of Care: Patient Declined Electronic Signature(s) Signed: 11/07/2022 3:40:07 PM By: Redmond Pulling RN, BSN Entered By: Redmond Pulling on 11/07/2022 13:36:00 [name] ([id number]) [phone].pdf Page 2 of 7 -------------------------------------------------------------------------------- Lower Extremity Assessment Details Patient Name: Date of Service: [name] [place]. 11/07/2022 1:00 PM Medical Record Number: [id number] Patient Account Number: [id number] Date of Birth/Sex: Treating RN: [birth year] (62 y.o. [name] Primary Care Nickoles Gregori: Nira Conn Other Clinician: Referring Asaiah Scarber: Treating Jazmine Longshore/Extender: Andrey Campanile in Treatment: 41 Edema Assessment Assessed: [name]: No] [Right: No] Edema: [Left: Yes] [Right: Yes] Calf Left: Right: Point of Measurement: From Medial Instep 37 cm 39 cm Ankle Left: Right: Point of Measurement: From Medial Instep 22.5 cm 23 cm Vascular Assessment Pulses: Dorsalis Pedis Palpable: [Left:Yes] Extremity colors, hair growth, and conditions: Extremity Color: [Left:Hyperpigmented] [Right:Hyperpigmented] Hair Growth on Extremity: [Left:No] [Right:No] Temperature of Extremity: [Left:Warm < 3 seconds] [Right:Warm < 3 seconds] Electronic Signature(s) Signed: 11/07/2022 3:40:07 PM By: Redmond Pulling RN, BSN Entered By: Redmond Pulling on 11/07/2022 13:04:07 -------------------------------------------------------------------------------- Multi Wound Chart Details Patient Name: Date of Service: [name], [name] [name] H.  11/07/2022 1:00 PM Medical Record Number: [id number] Patient Account Number: [id number] Date of Birth/Sex: Treating RN: 12/25/1960 (62 y.o. F) Primary Care Ahana Najera: Nira Conn Other Clinician: Referring Virgilia Quigg: Treating Kristopher Delk/Extender: Andrey Campanile in Treatment: 41 Vital Signs Height(in): 61 Pulse(bpm): 99 Weight(lbs): 277 Blood Pressure(mmHg): 129/81 Body Mass Index(BMI): 52.3 Temperature(F): 98.2 Respiratory Rate(breaths/min): 18 [3:Photos:] [N/A:N/A] Left, Posterior Lower Leg N/A N/A Wound Location: Trauma N/A N/A Wounding Event: Venous Leg Ulcer N/A N/A Primary Etiology: Anemia, Sleep Apnea, Hypertension, N/A N/A Comorbid History: Peripheral Venous Disease 06/28/2022 N/A N/A Date Acquired: 13 N/A N/A Weeks of Treatment: Open N/A  N/A Wound Status: No N/A N/A Wound Recurrence: 1.1x0.6x0.1 N/A N/A Measurements L x W x D (cm) 0.518 N/A N/A A (cm) : rea 0.052 N/A N/A Volume (cm) : 61.00% N/A N/A % Reduction in A rea: 60.90% N/A N/A % Reduction in Volume: Full Thickness Without Exposed N/A N/A Classification: Support Structures Small N/A N/A Exudate A mount: Serosanguineous N/A N/A Exudate Type: red, brown N/A N/A Exudate Color: Flat and Intact N/A N/A Wound Margin: Large (67-100%) N/A N/A Granulation A mount: Red N/A N/A Granulation Quality: Small (1-33%) N/A N/A Necrotic A mount: Fat Layer (Subcutaneous Tissue): Yes N/A N/A Exposed Structures: Fascia: No Tendon: No Muscle: No Joint: No Bone: No Medium (34-66%) N/A N/A Epithelialization: Debridement - Selective/Open Wound N/A N/A Debridement: Pre-procedure Verification/Time Out 13:10 N/A N/A Taken: Lidocaine 4% Topical Solution N/A N/A Pain Control: Slough N/A N/A Tissue Debrided: Non-Viable Tissue N/A N/A Level: 0.52 N/A N/A Debridement A (sq cm): rea Curette N/A N/A Instrument: Minimum N/A N/A Bleeding: Pressure N/A N/A Hemostasis A  chieved: Procedure was tolerated well N/A N/A Debridement Treatment Response: 1.1x0.6x0.1 N/A N/A Post Debridement Measurements L x W x D (cm) 0.052 N/A N/A Post Debridement Volume: (cm) Rash: Yes N/A N/A Periwound Skin Texture: No Abnormalities Noted N/A N/A Periwound Skin Moisture: Erythema: No N/A N/A Periwound Skin Color: No Abnormality N/A N/A Temperature: Yes N/A N/A Tenderness on Palpation: Debridement N/A N/A Procedures Performed: Treatment Notes Electronic Signature(s) Signed: 11/07/2022 1:26:33 PM By: [name] MD FACS Entered By: [name] on 11/07/2022 13:26:32 -------------------------------------------------------------------------------- Multi-Disciplinary Care Plan Details Patient Name: Date of Service: [place], [name] [name] H. 11/07/2022 1:00 PM Medical Record Number: [id number] Patient Account Number: [id number] Date of Birth/Sex: Treating RN: 02/22/1961 (62 y.o. [name] Primary Care Clois Treanor: Nira Conn Other Clinician: Referring Torrell Krutz: Treating Torence Palmeri/Extender: Andrey Campanile in Treatment: [address], [place] ([id number]) [id number] Page 4 of 7 Multidisciplinary Care Plan reviewed with physician Active Inactive Necrotic Tissue Nursing Diagnoses: Impaired tissue integrity related to necrotic/devitalized tissue Knowledge deficit related to management of necrotic/devitalized tissue Goals: Necrotic/devitalized tissue will be minimized in the wound bed Date Initiated: 01/23/2022 Target Resolution Date: 12/07/2022 Goal Status: Active Patient/caregiver will verbalize understanding of reason and process for debridement of necrotic tissue Date Initiated: 01/23/2022 Target Resolution Date: 12/07/2022 Goal Status: Active Interventions: Assess patient pain level pre-, during and post procedure and prior to discharge Provide education on necrotic tissue and debridement  process Treatment Activities: Apply topical anesthetic as ordered : 01/23/2022 Notes: Venous Leg Ulcer Nursing Diagnoses: Knowledge deficit related to disease process and management Potential for venous Insuffiency (use before diagnosis confirmed) Goals: Patient will maintain optimal edema control Date Initiated: 07/05/2022 Target Resolution Date: 12/07/2022 Goal Status: Active Interventions: Assess peripheral edema status every visit. Compression as ordered Provide education on venous insufficiency Treatment Activities: Therapeutic compression applied : 07/05/2022 Notes: Wound/Skin Impairment Nursing Diagnoses: Impaired tissue integrity Knowledge deficit related to ulceration/compromised skin integrity Goals: Patient/caregiver will verbalize understanding of skin care regimen Date Initiated: 01/23/2022 Target Resolution Date: 12/07/2022 Goal Status: Active Interventions: Assess patient/caregiver ability to obtain necessary supplies Assess ulceration(s) every visit Treatment Activities: Skin care regimen initiated : 01/23/2022 Topical wound management initiated : 01/23/2022 Notes: Electronic Signature(s) Signed: 11/07/2022 3:40:07 PM By: Redmond Pulling RN, BSN Entered By: Redmond Pulling on 11/07/2022 13:11:09 [name] ([id number]) [id number] Page 5 of 7 -------------------------------------------------------------------------------- Pain Assessment Details Patient Name: Date of Service: [name] 11/07/2022 1:00 PM Medical Record Number: [id number]  Patient Account Number: [id number] Date of Birth/Sex: Treating RN: 1960-09-19 (62 y.o. [name] Primary Care Jamen Loiseau: Nira Conn Other Clinician: Referring Florenda Watt: Treating Dat Derksen/Extender: Andrey Campanile in Treatment: 68 Active Problems Location of Pain Severity and Description of Pain Patient Has Paino No Site Locations Pain Management  and Medication Current Pain Management: Electronic Signature(s) Signed: 11/07/2022 3:40:07 PM By: Redmond Pulling RN, BSN Entered By: Redmond Pulling on 11/07/2022 13:01:13 -------------------------------------------------------------------------------- Patient/Caregiver Education Details Patient Name: Date of Service: [name], [name] [name] H. 8/1/2024andnbsp1:00 PM Medical Record Number: [id number] Patient Account Number: [id number] Date of Birth/Gender: Treating RN: 1960/12/10 (62 y.o. [name] Primary Care Physician: Nira Conn Other Clinician: Referring Physician: Treating Physician/Extender: Andrey Campanile in Treatment: 7 Education Assessment Education Provided To: Patient Education Topics Provided Wound/Skin Impairment: Methods: Explain/Verbal Responses: State content correctly [name], [name] ([id number]) [phone].pdf Page 6 of 7 Electronic Signature(s) Signed: 11/07/2022 3:40:07 PM By: Redmond Pulling RN, BSN Entered By: Redmond Pulling on 11/07/2022 13:11:25 -------------------------------------------------------------------------------- Wound Assessment Details Patient Name: Date of Service: [name], [name] [name] H. 11/07/2022 1:00 PM Medical Record Number: [id number] Patient Account Number: [id number] Date of Birth/Sex: Treating RN: 1961/01/13 (62 y.o. [name] Primary Care Jenika Chiem: Nira Conn Other Clinician: Referring Shaneal Barasch: Treating Adeana Grilliot/Extender: Andrey Campanile in Treatment: 41 Wound Status Wound Number: 3 Primary Venous Leg Ulcer Etiology: Wound Location: Left, Posterior Lower Leg Wound Status: Open Wounding Event: Trauma Comorbid Anemia, Sleep Apnea, Hypertension, Peripheral Venous Date Acquired: 06/28/2022 History: Disease Weeks Of Treatment: 13 Clustered Wound: No Photos Wound Measurements Length: (cm) 1.1 Width: (cm) 0.6 Depth: (cm) 0.1 Area: (cm)  0.518 Volume: (cm) 0.052 % Reduction in Area: 61% % Reduction in Volume: 60.9% Epithelialization: Medium (34-66%) Tunneling: No Undermining: No Wound Description Classification: Full Thickness Without Exposed Suppor Wound Margin: Flat and Intact Exudate Amount: Small Exudate Type: Serosanguineous Exudate Color: red, brown t Structures Foul Odor After Cleansing: No Slough/Fibrino Yes Wound Bed Granulation Amount: Large (67-100%) Exposed Structure Granulation Quality: Red Fascia Exposed: No Necrotic Amount: Small (1-33%) Fat Layer (Subcutaneous Tissue) Exposed: Yes Necrotic Quality: Adherent Slough Tendon Exposed: No Muscle Exposed: No Joint Exposed: No Bone Exposed: No Periwound Skin Texture Texture Color No Abnormalities Noted: Yes No Abnormalities Noted: Yes Moisture Temperature / Pain No Abnormalities Noted: Yes Temperature: No Abnormality Tenderness on PalpationEVELYNA, [name] ([id number]) [phone].pdf Page 7 of 7 Treatment Notes Wound #3 (Lower Leg) Wound Laterality: Left, Posterior Cleanser Soap and Water Discharge Instruction: May shower and wash wound with dial antibacterial soap and water prior to dressing change. Wound Cleanser Discharge Instruction: Cleanse the wound with wound cleanser prior to applying a clean dressing using gauze sponges, not tissue or cotton balls. Peri-Wound Care Sween Lotion (Moisturizing lotion) Discharge Instruction: Apply moisturizing lotion as directed Topical Gentamicin Discharge Instruction: As directed by physician Mupirocin Ointment Discharge Instruction: Apply Mupirocin (Bactroban) as instructed Primary Dressing Promogran Prisma Matrix, 4.34 (sq in) (silver collagen) Discharge Instruction: Moisten collagen with saline or hydrogel Secondary Dressing Woven Gauze Sponge, Non-Sterile 4x4 in Discharge Instruction: Apply over primary dressing as directed. Secured With Compression Wrap Urgo K2  Lite, (equivalent to a 3 layer) two layer compression system, regular Discharge Instruction: Apply Urgo K2 Lite as directed (alternative to 3 layer compression). Compression Stockings Add-Ons Electronic Signature(s) Signed: 11/07/2022 3:40:07 PM By: Redmond Pulling RN, BSN Entered By: Redmond Pulling on 11/07/2022 13:10:11 -------------------------------------------------------------------------------- Vitals Details Patient Name: Date of Service: [name], [name] [name] H. 11/07/2022  1:00 PM Medical Record Number: [id number] Patient Account Number: [id number] Date of Birth/Sex: Treating RN: 10/15/1960 (62 y.o. [name] Primary Care Isabellah Sobocinski: Nira Conn Other Clinician: Referring Kynadee Dam: Treating Darielle Hancher/Extender: Andrey Campanile in Treatment: 41 Vital Signs Time Taken: 13:00 Temperature (F): 98.2 Height (in): 61 Pulse (bpm): 99 Weight (lbs): 277 Respiratory Rate (breaths/min): 18 Body Mass Index (BMI): 52.3 Blood Pressure (mmHg): 129/81 Reference Range: 80 - 120 mg / dl Electronic Signature(s) Signed: 11/07/2022 3:40:07 PM By: Redmond Pulling RN, BSN Entered By: Redmond Pulling on 11/07/2022 13:01:04

## 2022-11-12 MED ORDER — NYSTATIN 100000 UNIT/ML MT SUSP: 5.0000 mL | Freq: Four times a day (QID) | OROMUCOSAL | 0 refills | Status: DC

## 2022-11-12 NOTE — Telephone Encounter (Signed)
Left message for patient to call back to discuss concern.

## 2022-11-12 NOTE — Telephone Encounter (Signed)
ATC patient.  Left detailed message on VM (DPR) with Dr. Laurena Spies recommendations.  Call back number given for any further questions.

## 2022-11-12 NOTE — Telephone Encounter (Signed)
Called and spoke with patient.  Patient stated she was prescribed Breo inhaler. Patient stated Virgel Bouquet has helped, but she had to stop Breo 2 weeks ago due to thrush.  Patient stated she was rinsing her mouth as instructed and tried a baking soda rinse to ease her mouth pain.  Patient stated mouth pain has improved some, but she is still having issues with eating certain foods, and using her cpap at night.  Patient is requesting something for thrush to be sent to [name] [address]    Message routed to Dr. Judeth Horn as provider of the day

## 2022-11-12 NOTE — Telephone Encounter (Signed)
Nystatin swish and spit sent to pharmacy. 5ml QID for 10 days. Thanks!

## 2022-11-13 ENCOUNTER — Encounter (HOSPITAL_BASED_OUTPATIENT_CLINIC_OR_DEPARTMENT_OTHER): Payer: BC Managed Care – PPO | Admitting: General Surgery

## 2022-11-13 DIAGNOSIS — L97222 Non-pressure chronic ulcer of left calf with fat layer exposed: Secondary | ICD-10-CM | POA: Diagnosis not present

## 2022-11-20 ENCOUNTER — Encounter (HOSPITAL_BASED_OUTPATIENT_CLINIC_OR_DEPARTMENT_OTHER): Payer: BC Managed Care – PPO | Admitting: General Surgery

## 2022-11-20 DIAGNOSIS — L97222 Non-pressure chronic ulcer of left calf with fat layer exposed: Secondary | ICD-10-CM | POA: Diagnosis not present

## 2022-11-20 NOTE — Progress Notes (Addendum)
ENISA, ROYSDON ([id number]) [id number] Page 1 of 12 Visit Report for 11/20/2022 Chief Complaint Document Details Patient Name: Date of Service: [name], [name]. 11/20/2022 11:45 A M Medical Record Number: [id number] Patient Account Number: [id number] Date of Birth/Sex: Treating RN: 1960-07-20 (62 y.o. F) Primary Care Provider: Nira Conn Other Clinician: Referring Provider: Treating Provider/Extender: Andrey Campanile in Treatment: 40 Information Obtained from: Patient Chief Complaint RLE ulcer Electronic Signature(s) Signed: 11/20/2022 12:17:10 PM By: [name] MD FACS Entered By: [name] on 11/20/2022 12:17:10 -------------------------------------------------------------------------------- Debridement Details Patient Name: Date of Service: [name], [name] 11/20/2022 11:45 A M Medical Record Number: [id number] Patient Account Number: [id number] Date of Birth/Sex: Treating RN: Sep 10, 1960 (62 y.o. F) Primary Care Provider: Nira Conn Other Clinician: Referring Provider: Treating Provider/Extender: Andrey Campanile in Treatment: 43 Debridement Performed for Assessment: Wound #3 Left,Posterior Lower Leg Performed By: Physician [name], MD Debridement Type: Debridement Severity of Tissue Pre Debridement: Fat layer exposed Level of Consciousness (Pre-procedure): Awake and Alert Pre-procedure Verification/Time Out Yes - 12:05 Taken: Start Time: 12:05 Pain Control: Lidocaine 5% topical ointment Percent of Wound Bed Debrided: 100% T Area Debrided (cm): otal 0.09 Tissue and other material debrided: Non-Viable, Slough, Slough Level: Non-Viable Tissue Debridement Description: Selective/Open Wound Instrument: Curette Bleeding: None End Time: 12:07 Procedural Pain: 0 Post Procedural Pain: 0 Response to Treatment: Procedure was tolerated well Level of Consciousness  (Post- Awake and Alert procedure): Post Debridement Measurements of Total Wound Length: (cm) 0.4 Width: (cm) 0.3 Depth: (cm) 0.1 Volume: (cm) 0.009 Character of Wound/Ulcer Post Debridement: Improved Severity of Tissue Post Debridement: Fat layer exposed [name], [name] ([id number]) [id number] Page 2 of 12 Post Procedure Diagnosis Same as Pre-procedure Electronic Signature(s) Signed: 11/20/2022 12:17:01 PM By: [name] MD FACS Entered By: [name] on 11/20/2022 12:17:01 -------------------------------------------------------------------------------- HPI Details Patient Name: Date of Service: [name], [name] 11/20/2022 11:45 A M Medical Record Number: [id number] Patient Account Number: [id number] Date of Birth/Sex: Treating RN: Oct 13, 1960 (62 y.o. F) Primary Care Provider: Nira Conn Other Clinician: Referring Provider: Treating Provider/Extender: Andrey Campanile in Treatment: 31 History of Present Illness HPI Description: 02/16/2020 upon evaluation today patient actually appears to be doing poorly in regard to her left medial lower extremity ulcer. This is actually an area that she tells me she has had intermittent issues with over the years although has been closed for some time she typically uses compression right now she has juxta lite compression wraps. With that being said she tells me that this nonetheless open several weeks/months ago and has been given her trouble since. She does have a history of chronic venous insufficiency she is seeing specialist for this in the past she has had an ablation as well as sclerotherapy. With that being said she also has hypertension chronically which is managed by her primary care provider. In general she seems to be worsening overall with regard to the wound and states that she finally realized that she needed to come in and have somebody look at this and not  continue to try to manage this on her own. No fevers, chills, nausea, vomiting, or diarrhea. 02/23/2020 on evaluation today patient appears to be doing well with regard to her wound. This is showing some signs of improvement which is great news still were not quite at the point where I would like to be as far as the overall appearance of the wound is concerned but I  do believe this is better than last week. I do believe the Iodoflex is helping as well. 03/08/2020 upon evaluation today patient appears to be doing well with regard to her wound. She has been tolerating the dressing changes without complication. Fortunately I feel like she has made great progress with the Iodoflex but I feel like it may be the point rest to switch to something else possibly a collagen- based dressing at this time. 03/15/2020 upon evaluation today patient appears to be doing excellent in regard to her leg ulcer. She has been tolerating the dressing changes without complication. Fortunately there is no signs of active infection. Overall she is measuring a little bit smaller today which is great news. 03/22/2020 upon evaluation today patient appears to be doing well with regard to her wound. She has been tolerating the dressing changes without complication. Fortunately there is no signs of active infection at this time. 03/28/2020; patient I do not usually see however she has a wound on the left anterior lower leg secondary to chronic venous insufficiency we have been using silver collagen under compression. She arrives in clinic with a nonviable surface requiring debridement 04/12/2020 upon evaluation today patient appears to be doing well all things considered with regard to her leg ulcer. She is tolerating the dressing changes without complication there is minimal dry skin around the edges of the wound that may be trapping and stopping some of the events of the new skin I am can work on that today. Otherwise the surface of  the wound appears to be doing excellent. 04/19/2020 upon evaluation today patient appears to be doing well with regard to her leg ulcer. She has been tolerating dressing changes without complication. Fortunately there is no signs of active infection at this time. No fever chills noted overall very pleased with how things seem to be progressing. 04/26/2020 on evaluation today patient appears to be doing well with regard to her wound currently. Is showing signs of excellent improvement overall is filling in nicely and there does not appear to be any signs of infection. No fevers, chills, nausea, vomiting, or diarrhea. 05/03/2020 upon evaluation today patient appears to be doing well with regard to her wound on the leg. This overall showing signs of good improvement which is great she has some good epithelial growth and overall I think that things are moving in the correct direction. We likewise going to continue with the wound care measures as before since she seems to making such good improvement. 2/3; venous wound on the left medial leg. This is contracting. We are using Prisma and 3 layer compression. She has a stocking and waiting in the eventuality this heals. She is already using it on the right 05/17/2020 upon evaluation today patient appears to be doing well with regard to her leg ulcer. She has been tolerating the dressing changes without complication. Fortunately there is no signs of active infection which is great news and overall very pleased with where things stand today. No fevers, chills, nausea, vomiting, or diarrhea. 05/24/2020 upon evaluation today patient appears to be doing well with regard to her wound. Overall I feel like she is making excellent progress. There does not appear to be any signs of active infection which is great news. 05/31/2020 upon evaluation today patient appears to be doing well with regard to her wound. There does not appear to be any signs of active infection which  is great news overall I am extremely pleased with where things stand today.  READMISSION 01/23/2022 She returns to clinic today with a new wound on her right posterior calf. She says that she was cleaning out an old shed near the middle of August this year and then noticed what seemed to be a bug bite on her right posterior calf. It was itchy, red, and raised. By the end of September, an ulcer had developed. She has been applying various topical creams such as hydrocortisone and others to the site. When it was not improving, she made an appointment in the wound care center. ABI in clinic today was 0.97. On her right posterior calf, there is a circular wound with necrotic fat and black eschar present. There is no purulent MACAYLAH, BATER ([id number]) [id number] Page 3 of 12 drainage or malodor. The periwound skin is in good condition with just a little induration that appears to be secondary to inflammation. 01/31/2022: The wound measures a little bit larger today, but overall is quite a bit cleaner. There is some undermining from 9:00 to 3:00. She is having some periwound itching and says that her wrap slid. 02/08/2022: Despite using an Unna boot first layer at the top of her wrap, it slid again and it looks like there is been some bruising at the wound site. The wound is about the same size in terms of dimensions. There is a fair amount of slough and nonviable tissue still present. Edema control is better than last week. 02/15/2022: The wound dimensions are about the same. There is less nonviable tissue present. The periwound erythema has improved. 02/22/2022: The wound has deteriorated over the past week. It is larger and the periwound is more edematous, erythematous, and indurated. It looks as though there has been more tissue breakdown with undermining present. She is having more pain. There is a foul odor coming from the wound. 02/26/2022: The wound looks quite a bit  better today and the odor is gone. I still do not have her culture data back, but she seems to be responding well to the Augmentin. 03/06/2022: Her wound continues to improve. There is still some slough accumulation, but the periwound skin is less inflamed. Her culture returned with a polymicrobial population including Pseudomonas and so levofloxacin was also prescribed. She did not understand why a second antibiotic was being added so she has not yet initiated this. 03/14/2022: The wound is about the same, to perhaps a little bit larger. There is a fair amount of slough accumulation on the surface, as well as some hypertrophic granulation tissue. She is still taking levofloxacin, but has completed taking Augmentin. She has her Jodie Echevaria compound with her today. 12/14; the patient has a significant circular wound on the posterior right calf. She is using Keystone and silver alginate under 3 layer compression. Her ABIs were within normal limits at 0.97. This may have been traumatic or an insect bite at the start I reviewed these records. 04/04/2022: Since I last saw the wound, it has contracted considerably. There is a fairly thick layer of slough on the surface, as it has not been debrided since the last time I did it. Edema control is excellent and the periwound skin is in much better condition. 04/12/2022: No significant change in the wound dimensions. It is filling with granulation tissue. Still with slough accumulation on the surface. 04/19/2022: The wound is smaller this week. There is some slough accumulation on the surface. 04/26/2022: The wound is smaller again this week and significantly cleaner. The wound surface is a little bit drier than  ideal. 05/03/2022: The wound measurements were about the same, but visually it appears smaller. There is still some undermining at the top of the wound. Moisture balance is better this week. 05/10/2022: The wound measured smaller today. There is still a fair  amount of undermining present. Slough has built up on the surface. We are still awaiting snap VAC approval. 05/17/2022: The wound is a little bit smaller today. There is less slough on the surface, but the granulation tissue still is not very robust. She has been approved for snap VAC but will have a 20% coinsurance and it is not clear what that amount would end up being for her. 05/27/2022: The surface of the wound has deteriorated and it is gray and fibrotic. No significant odor, but the drainage on her dressing was a little bit purulent. 05/31/2022: The wound looks quite a bit better today. It is still a little fibrotic but no longer has purulent-looking drainage. The color is better. She spoke with her insurance company and is interested in trying the snap VAC, now that she is aware of the cost to her. 06/07/2022: After 1 week in the snap VAC, there has been substantial improvement to her wound. The undermined portion is closing in. The surface has a healthier color and appearance. There is very minimal slough accumulation. 06/14/2022: The more shallow part of the undermined portion of her wound has closed. There is still some undermining from about 1-2 o'clock. The surface continues to improve. Minimal slough accumulation. 06/28/2022: The wound is shallower this week and the undermining has essentially closed and completely. The surface tissue is improving. 07/05/2022: The depth is almost immeasurable and the surface is nearly flush with the surrounding skin. The undermining has been eliminated. There is light slough on the wound surface. 07/12/2022: There is a little bit of slough on the wound surface. The depth is about the same as last week. 07/19/2022: The wound is essentially flush with the surrounding skin surface. There is slight slough present. No real change in the AP or transverse dimensions. 07/26/2022: The wound measured a little bit smaller today. It is flush with the surrounding skin surface and  there is good granulation tissue present. Minimal slough and biofilm accumulation. 08/02/2022: The right posterior leg wound is a little bit smaller. There is a little slough accumulation. She reported a new wound to [place] today. It is on her left posterior calf. It has been present for about 6 weeks. She is not sure of how it occurred. She has been trying to manage it at home with topical Neosporin and Band-Aids. The fat layer is exposed. The surface is fibrotic and covered with slough. 08/09/2022: The right posterior leg wound is smaller again this week. There is good granulation tissue on the surface with minimal slough accumulation. The left posterior calf wound has built up a thick layer of slough. It is still quite fibrotic underneath the slough, but there is a little bit of a pink color beginning to emerge. Edema control is good. 08/16/2022: Both wounds are smaller this week. There is minimal slough on the right posterior leg wound. There is slough that has reaccumulated on the left posterior calf wound but there is a little bit more granulation tissue emerging. 08/23/2022: Both wounds are about the same size this week, but the quality of the tissue and amount of granulation tissue on the left are both better. 08/30/2022: Both wounds measure smaller today, but there has been some tissue breakdown adjacent to the  left leg wound. She says it has been itchy. There is a little bit of slough on the surface of both wounds. 09/06/2022: The right wound measures smaller today. There is minimal slough on the wound surface. The left wound is about the same size. The periwound tissue looks better this week. There is still a layer of fibrinous slough on the left wound. Edema control is good bilaterally. 6/7 the patient has wounds on bilateral calfs. We are using silver alginate on the right Iodoflex on the left under Urgo lite compression. The wounds are measuring smaller. 09/20/2022: The right wound is flush with  the surrounding skin. There is good granulation tissue with some biofilm and thin eschar present. On the left, there is some slough accumulation and the patient says it has been a little itchy this week. SHERRICKA, VANGRINSVEN ([id number]) [id number] Page 4 of 12 09/27/2022: No significant change to the right wound. The left wound measures a little smaller, but still has some depth to it with slough accumulation. 10/04/2022: Nice improvement in both wounds. They are smaller and more superficial. The left wound has better granulation tissue and no longer has the hard fibrous surface. 10/11/2022: Both wounds continue to contract. There is minimal slough and eschar present. Granulation tissue continues to fill in on the left. Edema control is good. 10/21/2022: The right leg wound is about half the size as it was last week. There is a little perimeter eschar and some light slough on the surface. The left leg wound is about the same size, but shallower. There is still slough accumulation but the buds of granulation tissue are getting larger. Edema control remains excellent. 10/30/2022: The right leg wound is healed. The left leg wound is smaller with better granulation tissue and only a little bit of slough present. Edema control is excellent. 11/07/2022: The left leg wound continues to contract and the surface continues to improve. Minimal slough with good granulation tissue filling in. 11/13/2022: Most of the left leg wound has epithelialized. There is a small open area remaining that is about a millimeter in each dimension. There is slough accumulation present. 11/20/2022: There is a little bit of slough on the wound surface. It is about the same size as last week. Edema control is good. Electronic Signature(s) Signed: 11/20/2022 12:17:52 PM By: [name] MD FACS Entered By: [name] on 11/20/2022  12:17:51 -------------------------------------------------------------------------------- Physical Exam Details Patient Name: Date of Service: [name], [name] 11/20/2022 11:45 A M Medical Record Number: [id number] Patient Account Number: [id number] Date of Birth/Sex: Treating RN: 12/04/1960 (62 y.o. F) Primary Care Provider: Nira Conn Other Clinician: Referring Provider: Treating Provider/Extender: Andrey Campanile in Treatment: 44 Constitutional Hypertensive, asymptomatic. . . . no acute distress. Respiratory Normal work of breathing on room air. Notes 11/20/2022: There is a little bit of slough on the wound surface. It is about the same size as last week. Edema control is good. Electronic Signature(s) Signed: 11/20/2022 12:18:25 PM By: [name] MD FACS Entered By: [name] on 11/20/2022 12:18:25 -------------------------------------------------------------------------------- Physician Orders Details Patient Name: Date of Service: [name], [name] 11/20/2022 11:45 A M Medical Record Number: [id number] Patient Account Number: [id number] Date of Birth/Sex: Treating RN: [birth year] (62 y.o. F) Primary Care Provider: Nira Conn Other Clinician: Referring Provider: Treating Provider/Extender: Andrey Campanile in Treatment: 56 Verbal / Phone Orders: No Diagnosis Coding ICD-10 Coding ANTONIQUE, ARMENDAREZ ([id number]) [id number] Page 5 of 12 Code Description [phone] Non-pressure  chronic ulcer of left calf with fat layer exposed E66.01 Morbid (severe) obesity due to excess calories I10 Essential (primary) hypertension R60.0 Localized edema Follow-up Appointments Return Appointment in 1 week. Anesthetic (In clinic) Topical Lidocaine 4% applied to wound bed Bathing/ Shower/ Hygiene May shower with protection but do not get wound dressing(s) wet. Protect dressing(s) with water  repellant cover (for example, large plastic bag) or a cast cover and may then take shower. Wound Treatment Wound #3 - Lower Leg Wound Laterality: Left, Posterior Topical: Gentamicin Discharge Instructions: As directed by physician Topical: Mupirocin Ointment Discharge Instructions: Apply Mupirocin (Bactroban) as instructed Prim Dressing: Promogran Prisma Matrix, 4.34 (sq in) (silver collagen) ary Discharge Instructions: Moisten collagen with saline or hydrogel Secondary Dressing: Woven Gauze Sponges 2x2 in Discharge Instructions: Apply over primary dressing as directed. Compression Wrap: Urgo K2 Lite, (equivalent to a 3 layer) two layer compression system, regular Discharge Instructions: Apply Urgo K2 Lite as directed (alternative to 3 layer compression). Electronic Signature(s) Signed: 11/20/2022 2:33:10 PM By: [name] MD FACS Signed: 12/03/2022 2:03:55 PM By: Brenton Grills Previous Signature: 11/20/2022 12:22:00 PM Version By: [name] MD FACS Entered By: Brenton Grills on 11/20/2022 12:38:52 -------------------------------------------------------------------------------- Problem List Details Patient Name: Date of Service: [place], [name] 11/20/2022 11:45 A M Medical Record Number: [id number] Patient Account Number: [id number] Date of Birth/Sex: Treating RN: 06-Feb-1961 (62 y.o. F) Primary Care Provider: Nira Conn Other Clinician: Referring Provider: Treating Provider/Extender: Andrey Campanile in Treatment: 38 Active Problems ICD-10 Encounter Code Description Active Date MDM Diagnosis [phone] Non-pressure chronic ulcer of left calf with fat layer exposed 08/02/2022 No Yes E66.01 Morbid (severe) obesity due to excess calories 01/23/2022 No Yes I10 Essential (primary) hypertension 01/23/2022 No Yes [name], [name] ([id number]) [phone].pdf Page 6 of 12 R60.0 Localized edema 01/23/2022 No Yes Inactive  Problems Resolved Problems ICD-10 Code Description Active Date Resolved Date L97.212 Non-pressure chronic ulcer of right calf with fat layer exposed 01/23/2022 01/23/2022 Electronic Signature(s) Signed: 11/20/2022 12:15:04 PM By: [name] MD FACS Entered By: [name] on 11/20/2022 12:15:04 -------------------------------------------------------------------------------- Progress Note Details Patient Name: Date of Service: [name], [name] 11/20/2022 11:45 A M Medical Record Number: [id number] Patient Account Number: [id number] Date of Birth/Sex: Treating RN: 05/20/1960 (62 y.o. F) Primary Care Provider: Nira Conn Other Clinician: Referring Provider: Treating Provider/Extender: Andrey Campanile in Treatment: 43 Subjective Chief Complaint Information obtained from Patient RLE ulcer History of Present Illness (HPI) 02/16/2020 upon evaluation today patient actually appears to be doing poorly in regard to her left medial lower extremity ulcer. This is actually an area that she tells me she has had intermittent issues with over the years although has been closed for some time she typically uses compression right now she has juxta lite compression wraps. With that being said she tells me that this nonetheless open several weeks/months ago and has been given her trouble since. She does have a history of chronic venous insufficiency she is seeing specialist for this in the past she has had an ablation as well as sclerotherapy. With that being said she also has hypertension chronically which is managed by her primary care provider. In general she seems to be worsening overall with regard to the wound and states that she finally realized that she needed to come in and have somebody look at this and not continue to try to manage this on her own. No fevers, chills, nausea, vomiting, or diarrhea. 02/23/2020 on evaluation today  patient appears to be doing  well with regard to her wound. This is showing some signs of improvement which is great news still were not quite at the point where I would like to be as far as the overall appearance of the wound is concerned but I do believe this is better than last week. I do believe the Iodoflex is helping as well. 03/08/2020 upon evaluation today patient appears to be doing well with regard to her wound. She has been tolerating the dressing changes without complication. Fortunately I feel like she has made great progress with the Iodoflex but I feel like it may be the point rest to switch to something else possibly a collagen- based dressing at this time. 03/15/2020 upon evaluation today patient appears to be doing excellent in regard to her leg ulcer. She has been tolerating the dressing changes without complication. Fortunately there is no signs of active infection. Overall she is measuring a little bit smaller today which is great news. 03/22/2020 upon evaluation today patient appears to be doing well with regard to her wound. She has been tolerating the dressing changes without complication. Fortunately there is no signs of active infection at this time. 03/28/2020; patient I do not usually see however she has a wound on the left anterior lower leg secondary to chronic venous insufficiency we have been using silver collagen under compression. She arrives in clinic with a nonviable surface requiring debridement 04/12/2020 upon evaluation today patient appears to be doing well all things considered with regard to her leg ulcer. She is tolerating the dressing changes without complication there is minimal dry skin around the edges of the wound that may be trapping and stopping some of the events of the new skin I am can work on that today. Otherwise the surface of the wound appears to be doing excellent. 04/19/2020 upon evaluation today patient appears to be doing well with regard to her leg ulcer. She has been  tolerating dressing changes without complication. Fortunately there is no signs of active infection at this time. No fever chills noted overall very pleased with how things seem to be progressing. 04/26/2020 on evaluation today patient appears to be doing well with regard to her wound currently. Is showing signs of excellent improvement overall is filling in nicely and there does not appear to be any signs of infection. No fevers, chills, nausea, vomiting, or diarrhea. 05/03/2020 upon evaluation today patient appears to be doing well with regard to her wound on the leg. This overall showing signs of good improvement which is great she has some good epithelial growth and overall I think that things are moving in the correct direction. We likewise going to continue with the wound care measures as before since she seems to making such good improvement. 2/3; venous wound on the left medial leg. This is contracting. We are using Prisma and 3 layer compression. She has a stocking and waiting in the eventuality TWANIA, SIMMERING ([id number]) [id number] Page 7 of 12 this heals. She is already using it on the right 05/17/2020 upon evaluation today patient appears to be doing well with regard to her leg ulcer. She has been tolerating the dressing changes without complication. Fortunately there is no signs of active infection which is great news and overall very pleased with where things stand today. No fevers, chills, nausea, vomiting, or diarrhea. 05/24/2020 upon evaluation today patient appears to be doing well with regard to her wound. Overall I feel like she is making  excellent progress. There does not appear to be any signs of active infection which is great news. 05/31/2020 upon evaluation today patient appears to be doing well with regard to her wound. There does not appear to be any signs of active infection which is great news overall I am extremely pleased with where things  stand today. READMISSION 01/23/2022 She returns to clinic today with a new wound on her right posterior calf. She says that she was cleaning out an old shed near the middle of August this year and then noticed what seemed to be a bug bite on her right posterior calf. It was itchy, red, and raised. By the end of September, an ulcer had developed. She has been applying various topical creams such as hydrocortisone and others to the site. When it was not improving, she made an appointment in the wound care center. ABI in clinic today was 0.97. On her right posterior calf, there is a circular wound with necrotic fat and black eschar present. There is no purulent drainage or malodor. The periwound skin is in good condition with just a little induration that appears to be secondary to inflammation. 01/31/2022: The wound measures a little bit larger today, but overall is quite a bit cleaner. There is some undermining from 9:00 to 3:00. She is having some periwound itching and says that her wrap slid. 02/08/2022: Despite using an Unna boot first layer at the top of her wrap, it slid again and it looks like there is been some bruising at the wound site. The wound is about the same size in terms of dimensions. There is a fair amount of slough and nonviable tissue still present. Edema control is better than last week. 02/15/2022: The wound dimensions are about the same. There is less nonviable tissue present. The periwound erythema has improved. 02/22/2022: The wound has deteriorated over the past week. It is larger and the periwound is more edematous, erythematous, and indurated. It looks as though there has been more tissue breakdown with undermining present. She is having more pain. There is a foul odor coming from the wound. 02/26/2022: The wound looks quite a bit better today and the odor is gone. I still do not have her culture data back, but she seems to be responding well to the Augmentin. 03/06/2022:  Her wound continues to improve. There is still some slough accumulation, but the periwound skin is less inflamed. Her culture returned with a polymicrobial population including Pseudomonas and so levofloxacin was also prescribed. She did not understand why a second antibiotic was being added so she has not yet initiated this. 03/14/2022: The wound is about the same, to perhaps a little bit larger. There is a fair amount of slough accumulation on the surface, as well as some hypertrophic granulation tissue. She is still taking levofloxacin, but has completed taking Augmentin. She has her Jodie Echevaria compound with her today. 12/14; the patient has a significant circular wound on the posterior right calf. She is using Keystone and silver alginate under 3 layer compression. Her ABIs were within normal limits at 0.97. This may have been traumatic or an insect bite at the start I reviewed these records. 04/04/2022: Since I last saw the wound, it has contracted considerably. There is a fairly thick layer of slough on the surface, as it has not been debrided since the last time I did it. Edema control is excellent and the periwound skin is in much better condition. 04/12/2022: No significant change in the wound  dimensions. It is filling with granulation tissue. Still with slough accumulation on the surface. 04/19/2022: The wound is smaller this week. There is some slough accumulation on the surface. 04/26/2022: The wound is smaller again this week and significantly cleaner. The wound surface is a little bit drier than ideal. 05/03/2022: The wound measurements were about the same, but visually it appears smaller. There is still some undermining at the top of the wound. Moisture balance is better this week. 05/10/2022: The wound measured smaller today. There is still a fair amount of undermining present. Slough has built up on the surface. We are still awaiting snap VAC approval. 05/17/2022: The wound is a little bit  smaller today. There is less slough on the surface, but the granulation tissue still is not very robust. She has been approved for snap VAC but will have a 20% coinsurance and it is not clear what that amount would end up being for her. 05/27/2022: The surface of the wound has deteriorated and it is gray and fibrotic. No significant odor, but the drainage on her dressing was a little bit purulent. 05/31/2022: The wound looks quite a bit better today. It is still a little fibrotic but no longer has purulent-looking drainage. The color is better. She spoke with her insurance company and is interested in trying the snap VAC, now that she is aware of the cost to her. 06/07/2022: After 1 week in the snap VAC, there has been substantial improvement to her wound. The undermined portion is closing in. The surface has a healthier color and appearance. There is very minimal slough accumulation. 06/14/2022: The more shallow part of the undermined portion of her wound has closed. There is still some undermining from about 1-2 o'clock. The surface continues to improve. Minimal slough accumulation. 06/28/2022: The wound is shallower this week and the undermining has essentially closed and completely. The surface tissue is improving. 07/05/2022: The depth is almost immeasurable and the surface is nearly flush with the surrounding skin. The undermining has been eliminated. There is light slough on the wound surface. 07/12/2022: There is a little bit of slough on the wound surface. The depth is about the same as last week. 07/19/2022: The wound is essentially flush with the surrounding skin surface. There is slight slough present. No real change in the AP or transverse dimensions. 07/26/2022: The wound measured a little bit smaller today. It is flush with the surrounding skin surface and there is good granulation tissue present. Minimal slough and biofilm accumulation. 08/02/2022: The right posterior leg wound is a little bit  smaller. There is a little slough accumulation. She reported a new wound to [place] today. It is on her left posterior calf. It has been present for about 6 weeks. She is not sure of how it occurred. She has been trying to manage it at home with topical Neosporin and Band-Aids. The fat layer is exposed. The surface is fibrotic and covered with slough. 08/09/2022: The right posterior leg wound is smaller again this week. There is good granulation tissue on the surface with minimal slough accumulation. The left posterior calf wound has built up a thick layer of slough. It is still quite fibrotic underneath the slough, but there is a little bit of a pink color beginning to emerge. Edema control is good. [name], [name] ([id number]) [id number] Page 8 of 12 08/16/2022: Both wounds are smaller this week. There is minimal slough on the right posterior leg wound. There is slough that has reaccumulated on  the left posterior calf wound but there is a little bit more granulation tissue emerging. 08/23/2022: Both wounds are about the same size this week, but the quality of the tissue and amount of granulation tissue on the left are both better. 08/30/2022: Both wounds measure smaller today, but there has been some tissue breakdown adjacent to the left leg wound. She says it has been itchy. There is a little bit of slough on the surface of both wounds. 09/06/2022: The right wound measures smaller today. There is minimal slough on the wound surface. The left wound is about the same size. The periwound tissue looks better this week. There is still a layer of fibrinous slough on the left wound. Edema control is good bilaterally. 6/7 the patient has wounds on bilateral calfs. We are using silver alginate on the right Iodoflex on the left under Urgo lite compression. The wounds are measuring smaller. 09/20/2022: The right wound is flush with the surrounding skin. There is good granulation tissue with  some biofilm and thin eschar present. On the left, there is some slough accumulation and the patient says it has been a little itchy this week. 09/27/2022: No significant change to the right wound. The left wound measures a little smaller, but still has some depth to it with slough accumulation. 10/04/2022: Nice improvement in both wounds. They are smaller and more superficial. The left wound has better granulation tissue and no longer has the hard fibrous surface. 10/11/2022: Both wounds continue to contract. There is minimal slough and eschar present. Granulation tissue continues to fill in on the left. Edema control is good. 10/21/2022: The right leg wound is about half the size as it was last week. There is a little perimeter eschar and some light slough on the surface. The left leg wound is about the same size, but shallower. There is still slough accumulation but the buds of granulation tissue are getting larger. Edema control remains excellent. 10/30/2022: The right leg wound is healed. The left leg wound is smaller with better granulation tissue and only a little bit of slough present. Edema control is excellent. 11/07/2022: The left leg wound continues to contract and the surface continues to improve. Minimal slough with good granulation tissue filling in. 11/13/2022: Most of the left leg wound has epithelialized. There is a small open area remaining that is about a millimeter in each dimension. There is slough accumulation present. 11/20/2022: There is a little bit of slough on the wound surface. It is about the same size as last week. Edema control is good. Patient History Information obtained from Patient. Family History Cancer - Mother, Diabetes - Father, Hypertension - Mother, Kidney Disease - Mother, Lung Disease - Father, Thyroid Problems - Paternal Grandparents, No family history of Heart Disease, Seizures, Stroke, Tuberculosis. Social History Never smoker, Marital Status - Married,  Alcohol Use - Never, Drug Use - No History, Caffeine Use - Daily - coffee. Medical History Eyes Denies history of Cataracts, Glaucoma, Optic Neuritis Ear/Nose/Mouth/Throat Denies history of Chronic sinus problems/congestion, Middle ear problems Hematologic/Lymphatic Patient has history of Anemia - iron Denies history of Hemophilia, Human Immunodeficiency Virus, Lymphedema, Sickle Cell Disease Respiratory Patient has history of Sleep Apnea - CPAP Denies history of Aspiration, Asthma, Chronic Obstructive Pulmonary Disease (COPD), Pneumothorax, Tuberculosis Cardiovascular Patient has history of Hypertension, Peripheral Venous Disease Denies history of Angina, Arrhythmia, Congestive Heart Failure, Coronary Artery Disease, Deep Vein Thrombosis, Hypotension, Myocardial Infarction, Peripheral Arterial Disease, Phlebitis, Vasculitis Gastrointestinal Denies history of Cirrhosis , Colitis,  Crohns, Hepatitis A, Hepatitis B, Hepatitis C Endocrine Denies history of Type I Diabetes, Type II Diabetes Genitourinary Denies history of End Stage Renal Disease Immunological Denies history of Lupus Erythematosus, Raynauds, Scleroderma Integumentary (Skin) Denies history of History of Burn Musculoskeletal Denies history of Gout, Rheumatoid Arthritis, Osteoarthritis, Osteomyelitis Neurologic Denies history of Dementia, Neuropathy, Quadriplegia, Paraplegia, Seizure Disorder Oncologic Denies history of Received Chemotherapy, Received Radiation Psychiatric Denies history of Anorexia/bulimia, Confinement Anxiety Hospitalization/Surgery History - cholecystectomy 1980s. - nephrolithasis 1980s. - plate and rod in right elbow surgery 2010. Medical A Surgical History Notes nd Genitourinary years ago kidney stones Oncologic skin Ca removed from back years ago SHAKERA, NICOLICH ([id number]) [id number] Page 9 of 12 Objective Constitutional Hypertensive, asymptomatic. no acute  distress. Vitals Time Taken: 11:52 AM, Height: 61 in, Weight: 277 lbs, BMI: 52.3, Temperature: 98.8 F, Pulse: 88 bpm, Respiratory Rate: 18 breaths/min, Blood Pressure: 155/85 mmHg. Respiratory Normal work of breathing on room air. General Notes: 11/20/2022: There is a little bit of slough on the wound surface. It is about the same size as last week. Edema control is good. Integumentary (Hair, Skin) Wound #3 status is Open. Original cause of wound was Trauma. The date acquired was: 06/28/2022. The wound has been in treatment 15 weeks. The wound is located on the Left,Posterior Lower Leg. The wound measures 0.4cm length x 0.3cm width x 0.1cm depth; 0.094cm^2 area and 0.009cm^3 volume. There is Fat Layer (Subcutaneous Tissue) exposed. There is no tunneling or undermining noted. There is a small amount of serosanguineous drainage noted. The wound margin is flat and intact. There is small (1-33%) red granulation within the wound bed. There is a large (67-100%) amount of necrotic tissue within the wound bed including Eschar and Adherent Slough. The periwound skin appearance had no abnormalities noted for texture. The periwound skin appearance had no abnormalities noted for moisture. The periwound skin appearance had no abnormalities noted for color. Periwound temperature was noted as No Abnormality. The periwound has tenderness on palpation. Assessment Active Problems ICD-10 Non-pressure chronic ulcer of left calf with fat layer exposed Morbid (severe) obesity due to excess calories Essential (primary) hypertension Localized edema Procedures Wound #3 Pre-procedure diagnosis of Wound #3 is a Venous Leg Ulcer located on the Left,Posterior Lower Leg .Severity of Tissue Pre Debridement is: Fat layer exposed. There was a Selective/Open Wound Non-Viable Tissue Debridement with a total area of 0.09 sq cm performed by [name], MD. With the following instrument(s): Curette to remove Non-Viable  tissue/material. Material removed includes [facility] after achieving pain control using Lidocaine 5% topical ointment. A time out was conducted at 12:05, prior to the start of the procedure. There was no bleeding. The procedure was tolerated well with a pain level of 0 throughout and a pain level of 0 following the procedure. Post Debridement Measurements: 0.4cm length x 0.3cm width x 0.1cm depth; 0.009cm^3 volume. Character of Wound/Ulcer Post Debridement is improved. Severity of Tissue Post Debridement is: Fat layer exposed. Post procedure Diagnosis Wound #3: Same as Pre-Procedure Plan Follow-up Appointments: Return Appointment in 1 week. Anesthetic: (In clinic) Topical Lidocaine 4% applied to wound bed Bathing/ Shower/ Hygiene: May shower with protection but do not get wound dressing(s) wet. Protect dressing(s) with water repellant cover (for example, large plastic bag) or a cast cover and may then take shower. WOUND #3: - Lower Leg Wound Laterality: Left, Posterior Topical: Gentamicin Discharge Instructions: As directed by physician Topical: Mupirocin Ointment Discharge Instructions: Apply Mupirocin (Bactroban) as instructed Prim Dressing: Promogran Prisma  Matrix, 4.34 (sq in) (silver collagen) ary Discharge Instructions: Moisten collagen with saline or hydrogel Secondary Dressing: Woven Gauze Sponges 2x2 in Discharge Instructions: Apply over primary dressing as directed. Com pression Wrap: Urgo K2 Lite, (equivalent to a 3 layer) two layer compression system, regular ILINE, MOUSER ([id number]) [phone] Page 10 of 12 Discharge Instructions: Apply Urgo K2 Lite as directed (alternative to 3 layer compression). 11/20/2022: There is a little bit of slough on the wound surface. It is about the same size as last week. Edema control is good. I used a curette to debride the slough from her wound. We will continue the mixture of topical gentamicin and mupirocin with  Prisma silver collagen and Urgo lite 3 layer compression equivalent. Follow-up in 1 week. Electronic Signature(s) Signed: 11/24/2022 10:48:24 AM By: Shawn Stall RN, BSN Signed: 11/25/2022 8:35:27 AM By: [name] MD FACS Previous Signature: 11/20/2022 12:22:30 PM Version By: [name] MD FACS Entered By: Shawn Stall on 11/24/2022 09:28:45 -------------------------------------------------------------------------------- HxROS Details Patient Name: Date of Service: [name], [name] 11/20/2022 11:45 A M Medical Record Number: [id number] Patient Account Number: [id number] Date of Birth/Sex: Treating RN: [birth year] (62 y.o. F) Primary Care Provider: Nira Conn Other Clinician: Referring Provider: Treating Provider/Extender: Andrey Campanile in Treatment: 78 Information Obtained From Patient Eyes Medical History: Negative for: Cataracts; Glaucoma; Optic Neuritis Ear/Nose/Mouth/Throat Medical History: Negative for: Chronic sinus problems/congestion; Middle ear problems Hematologic/Lymphatic Medical History: Positive for: Anemia - iron Negative for: Hemophilia; Human Immunodeficiency Virus; Lymphedema; Sickle Cell Disease Respiratory Medical History: Positive for: Sleep Apnea - CPAP Negative for: Aspiration; Asthma; Chronic Obstructive Pulmonary Disease (COPD); Pneumothorax; Tuberculosis Cardiovascular Medical History: Positive for: Hypertension; Peripheral Venous Disease Negative for: Angina; Arrhythmia; Congestive Heart Failure; Coronary Artery Disease; Deep Vein Thrombosis; Hypotension; Myocardial Infarction; Peripheral Arterial Disease; Phlebitis; Vasculitis Gastrointestinal Medical History: Negative for: Cirrhosis ; Colitis; Crohns; Hepatitis A; Hepatitis B; Hepatitis C Endocrine Medical History: Negative for: Type I Diabetes; Type II Diabetes [address] [name], BEBBER ([id number]) [id number]  Page 11 of 12 Medical History: Negative for: End Stage Renal Disease Past Medical History Notes: years ago kidney stones Immunological Medical History: Negative for: Lupus Erythematosus; Raynauds; Scleroderma Integumentary (Skin) Medical History: Negative for: History of Burn Musculoskeletal Medical History: Negative for: Gout; Rheumatoid Arthritis; Osteoarthritis; Osteomyelitis Neurologic Medical History: Negative for: Dementia; Neuropathy; Quadriplegia; Paraplegia; Seizure Disorder Oncologic Medical History: Negative for: Received Chemotherapy; Received Radiation Past Medical History Notes: skin Ca removed from back years ago Psychiatric Medical History: Negative for: Anorexia/bulimia; Confinement Anxiety Immunizations Pneumococcal Vaccine: Received Pneumococcal Vaccination: No Implantable Devices None Hospitalization / Surgery History Type of Hospitalization/Surgery cholecystectomy 1980s nephrolithasis 1980s plate and rod in right elbow surgery 2010 Family and Social History Cancer: Yes - Mother; Diabetes: Yes - Father; Heart Disease: No; Hypertension: Yes - Mother; Kidney Disease: Yes - Mother; Lung Disease: Yes - Father; Seizures: No; Stroke: No; Thyroid Problems: Yes - Paternal Grandparents; Tuberculosis: No; Never smoker; Marital Status - Married; Alcohol Use: Never; Drug Use: No History; Caffeine Use: Daily - coffee; Financial Concerns: No; Food, Clothing or Shelter Needs: No; Support System Lacking: No; Transportation Concerns: No Electronic Signature(s) Signed: 11/20/2022 2:33:10 PM By: [name] MD FACS Entered By: [name] on 11/20/2022 12:18:02 -------------------------------------------------------------------------------- SuperBill Details Patient Name: Date of Service: [name], [name] 11/20/2022 Medical Record Number: [id number] Patient Account Number: [id number] Date of Birth/Sex: Treating RN: May 24, 1960 (62 y.o. F) Primary Care  Provider: Nira Conn Other Clinician: Referring Provider: Treating Provider/Extender: [name]  Nira Conn Weeks in Treatment: [address] [place] ([id number]) [id number] Page 12 of 12 Diagnosis Coding ICD-10 Codes Code Description [phone] Non-pressure chronic ulcer of left calf with fat layer exposed E66.01 Morbid (severe) obesity due to excess calories I10 Essential (primary) hypertension R60.0 Localized edema Facility Procedures : CPT4 Code: [id number] Description: 97597 - DEBRIDE WOUND 1ST 20 SQ CM OR < ICD-10 Diagnosis Description L97.222 Non-pressure chronic ulcer of left calf with fat layer exposed Modifier: Quantity: 1 Physician Procedures : CPT4 Code Description Modifier [id number] - WC PHYS LEVEL 4 - EST PT 25 ICD-10 Diagnosis Description L97.222 Non-pressure chronic ulcer of left calf with fat layer exposed R60.0 Localized edema E66.01 Morbid (severe) obesity due to excess calories  I10 Essential (primary) hypertension Quantity: 1 : [id number] 97597 - WC PHYS DEBR WO ANESTH 20 SQ CM ICD-10 Diagnosis Description L97.222 Non-pressure chronic ulcer of left calf with fat layer exposed Quantity: 1 Electronic Signature(s) Signed: 11/20/2022 12:22:51 PM By: [name] MD FACS Entered By: [name] on 11/20/2022 12:22:50

## 2022-11-25 ENCOUNTER — Telehealth: Payer: Self-pay | Admitting: *Deleted

## 2022-11-25 DIAGNOSIS — I10 Essential (primary) hypertension: Secondary | ICD-10-CM

## 2022-11-25 MED ORDER — LOSARTAN POTASSIUM 100 MG PO TABS: ORAL_TABLET | ORAL | 0 refills | Status: DC

## 2022-11-25 NOTE — Telephone Encounter (Signed)
Rx done. 

## 2022-11-27 ENCOUNTER — Encounter (HOSPITAL_BASED_OUTPATIENT_CLINIC_OR_DEPARTMENT_OTHER): Payer: BC Managed Care – PPO | Admitting: General Surgery

## 2022-11-27 DIAGNOSIS — L97222 Non-pressure chronic ulcer of left calf with fat layer exposed: Secondary | ICD-10-CM | POA: Diagnosis not present

## 2022-12-03 NOTE — Progress Notes (Signed)
[name], [name] ([id number]) [id number] Page 1 of 7 Visit Report for 11/27/2022 Arrival Information Details Patient Name: Date of Service: [name], [name] 11/27/2022 4:00 PM Medical Record Number: [id number] Patient Account Number: [id number] Date of Birth/Sex: Treating RN: 09-15-1960 (62 y.o. [name] Primary Care [name]: [name] Other Clinician: Referring [name]: Treating [name]/Extender: [name] in Treatment: 25 Visit Information History Since Last Visit All ordered tests and consults were completed: Yes Patient Arrived: Ambulatory Added or deleted any medications: No Arrival Time: 16:00 Any new allergies or adverse reactions: No Accompanied By: self Had a fall or experienced change in No Transfer Assistance: None activities of daily living that may affect Patient Identification Verified: Yes risk of falls: Secondary Verification Process Completed: Yes Signs or symptoms of abuse/neglect since last visito No Patient Requires Transmission-Based Precautions: No Hospitalized since last visit: No Patient Has Alerts: No Implantable device outside of the clinic excluding No cellular tissue based products placed in the center since last visit: Has Dressing in Place as Prescribed: Yes Pain Present Now: No Electronic Signature(s) Signed: 12/03/2022 2:02:59 PM By: Brenton Grills Entered By: Brenton Grills on 11/27/2022 16:02:50 -------------------------------------------------------------------------------- Encounter Discharge Information Details Patient Name: Date of Service: [address] [name] 11/27/2022 4:00 PM Medical Record Number: [id number] Patient Account Number: [id number] Date of Birth/Sex: Treating RN: 1960-10-13 (62 y.o. [name] Primary Care Ranay Ketter: [name] Other Clinician: Referring Oren Barella: Treating Kelin Nixon/Extender: [name]  in Treatment: 54 Encounter Discharge Information Items Post Procedure Vitals Discharge Condition: Stable Temperature (F): 98.3 Ambulatory Status: Ambulatory Pulse (bpm): 74 Discharge Destination: Home Respiratory Rate (breaths/min): 18 Transportation: Private Auto Blood Pressure (mmHg): 145/70 Accompanied By: self Schedule Follow-up Appointment: Yes Clinical Summary of Care: Patient Declined Electronic Signature(s) Signed: 12/03/2022 2:02:59 PM By: Brenton Grills Entered By: Brenton Grills on 11/27/2022 16:32:43 [name] ([id number]) [id number] Page 2 of 7 -------------------------------------------------------------------------------- Lower Extremity Assessment Details Patient Name: Date of Service: [name] [place]. 11/27/2022 4:00 PM Medical Record Number: [id number] Patient Account Number: [id number] Date of Birth/Sex: Treating RN: 05-07-1960 (62 y.o. [name] Primary Care Habiba Treloar: [name] Other Clinician: Referring Arnella Pralle: Treating Kile Kabler/Extender: [name] in Treatment: 44 Edema Assessment Assessed: [name]: No] Franne Forts: No] Edema: [Left: Yes] [Right: Yes] Calf Left: Right: Point of Measurement: From Medial Instep 42 cm 39 cm Ankle Left: Right: Point of Measurement: From Medial Instep 22 cm 23 cm Vascular Assessment Pulses: Dorsalis Pedis Palpable: [Left:Yes] [Right:Yes] Extremity colors, hair growth, and conditions: Extremity Color: [Left:Hyperpigmented] [Right:Hyperpigmented] Hair Growth on Extremity: [Left:No] [Right:No] Temperature of Extremity: [Left:Warm < 3 seconds] [Right:Warm < 3 seconds] Toe Nail Assessment Left: Right: Thick: No No Discolored: No No Deformed: No No Improper Length and Hygiene: No No Electronic Signature(s) Signed: 12/03/2022 2:02:59 PM By: Brenton Grills Entered By: Brenton Grills on 11/27/2022  16:10:14 -------------------------------------------------------------------------------- Multi Wound Chart Details Patient Name: Date of Service: [name], [name] 11/27/2022 4:00 PM Medical Record Number: [id number] Patient Account Number: [id number] Date of Birth/Sex: Treating RN: Sep 05, 1960 (62 y.o. F) Primary Care Danarius Mcconathy: [name] Other Clinician: Referring Artesha Wemhoff: Treating Kateryn Marasigan/Extender: [name] in Treatment: 44 Vital Signs Height(in): 61 Pulse(bpm): 92 Weight(lbs): 277 Blood Pressure(mmHg): 96/51 Body Mass Index(BMI): 52.3 Temperature(F): 97.6 Respiratory Rate(breaths/min): 18 [name], [name] ([id number]) [id number] Page 3 of 7 [3:Photos:] [N/A:N/A] Left, Posterior Lower Leg N/A N/A Wound Location: Trauma N/A N/A Wounding Event: Venous Leg Ulcer N/A N/A Primary  Etiology: Anemia, Sleep Apnea, Hypertension, N/A N/A Comorbid History: Peripheral Venous Disease 06/28/2022 N/A N/A Date Acquired: 16 N/A N/A Weeks of Treatment: Open N/A N/A Wound Status: No N/A N/A Wound Recurrence: 0.3x0.3x0.1 N/A N/A Measurements L x W x D (cm) 0.071 N/A N/A A (cm) : rea 0.007 N/A N/A Volume (cm) : 94.60% N/A N/A % Reduction in A rea: 94.70% N/A N/A % Reduction in Volume: Full Thickness Without Exposed N/A N/A Classification: Support Structures Small N/A N/A Exudate A mount: Serosanguineous N/A N/A Exudate Type: red, brown N/A N/A Exudate Color: Flat and Intact N/A N/A Wound Margin: Small (1-33%) N/A N/A Granulation A mount: Red N/A N/A Granulation Quality: Large (67-100%) N/A N/A Necrotic A mount: Eschar, Adherent Slough N/A N/A Necrotic Tissue: Fat Layer (Subcutaneous Tissue): Yes N/A N/A Exposed Structures: Fascia: No Tendon: No Muscle: No Joint: No Bone: No Large (67-100%) N/A N/A Epithelialization: Debridement - Selective/Open Wound N/A N/A Debridement: Pre-procedure  Verification/Time Out 16:19 N/A N/A Taken: Lidocaine 4% Topical Solution N/A N/A Pain Control: Slough N/A N/A Tissue Debrided: Non-Viable Tissue N/A N/A Level: 0.07 N/A N/A Debridement A (sq cm): rea Curette N/A N/A Instrument: Minimum N/A N/A Bleeding: Pressure N/A N/A Hemostasis A chieved: Procedure was tolerated well N/A N/A Debridement Treatment Response: 0.3x0.3x0.1 N/A N/A Post Debridement Measurements L x W x D (cm) 0.007 N/A N/A Post Debridement Volume: (cm) Rash: Yes N/A N/A Periwound Skin Texture: No Abnormalities Noted N/A N/A Periwound Skin Moisture: Erythema: No N/A N/A Periwound Skin Color: No Abnormality N/A N/A Temperature: Yes N/A N/A Tenderness on Palpation: Debridement N/A N/A Procedures Performed: Treatment Notes Electronic Signature(s) Signed: 11/27/2022 4:21:30 PM By: [name] MD FACS Entered By: [name] on 11/27/2022 16:21:30 Multi-Disciplinary Care Plan Details -------------------------------------------------------------------------------- [name] ([id number]) [id number] Page 4 of 7 Patient Name: Date of Service: [place], [name] 11/27/2022 4:00 PM Medical Record Number: [id number] Patient Account Number: [id number] Date of Birth/Sex: Treating RN: May 23, 1960 (62 y.o. [name] Primary Care Synda Bagent: [name] Other Clinician: Referring Akia Montalban: Treating Akela Pocius/Extender: [name] in Treatment: 44 Multidisciplinary Care Plan reviewed with physician Active Inactive Necrotic Tissue Nursing Diagnoses: Impaired tissue integrity related to necrotic/devitalized tissue Knowledge deficit related to management of necrotic/devitalized tissue Goals: Necrotic/devitalized tissue will be minimized in the wound bed Date Initiated: 01/23/2022 Target Resolution Date: 12/07/2022 Goal Status: Active Patient/caregiver will verbalize understanding of  reason and process for debridement of necrotic tissue Date Initiated: 01/23/2022 Target Resolution Date: 12/07/2022 Goal Status: Active Interventions: Assess patient pain level pre-, during and post procedure and prior to discharge Provide education on necrotic tissue and debridement process Treatment Activities: Apply topical anesthetic as ordered : 01/23/2022 Notes: Venous Leg Ulcer Nursing Diagnoses: Knowledge deficit related to disease process and management Potential for venous Insuffiency (use before diagnosis confirmed) Goals: Patient will maintain optimal edema control Date Initiated: 07/05/2022 Target Resolution Date: 12/07/2022 Goal Status: Active Interventions: Assess peripheral edema status every visit. Compression as ordered Provide education on venous insufficiency Treatment Activities: Therapeutic compression applied : 07/05/2022 Notes: Wound/Skin Impairment Nursing Diagnoses: Impaired tissue integrity Knowledge deficit related to ulceration/compromised skin integrity Goals: Patient/caregiver will verbalize understanding of skin care regimen Date Initiated: 01/23/2022 Target Resolution Date: 12/07/2022 Goal Status: Active Interventions: Assess patient/caregiver ability to obtain necessary supplies Assess ulceration(s) every visit Treatment Activities: Skin care regimen initiated : 01/23/2022 Topical wound management initiated : 01/23/2022 Notes: [name], [name] ([id number]) [id number] Page 5 of 7 Electronic Signature(s) Signed: 12/03/2022 2:02:59 PM By:  Brenton Grills Entered By: Brenton Grills on 11/27/2022 16:14:34 -------------------------------------------------------------------------------- Pain Assessment Details Patient Name: Date of Service: [name] [name] 11/27/2022 4:00 PM Medical Record Number: [id number] Patient Account Number: [id number] Date of Birth/Sex: Treating RN: [birth year] (62 y.o. [name] Primary Care Taedyn Glasscock: [name] Other Clinician: Referring Angeleena Dueitt: Treating Miko Markwood/Extender: [name] in Treatment: 76 Active Problems Location of Pain Severity and Description of Pain Patient Has Paino No Site Locations Pain Management and Medication Current Pain Management: Electronic Signature(s) Signed: 12/03/2022 2:02:59 PM By: Brenton Grills Entered By: Brenton Grills on 11/27/2022 16:03:35 -------------------------------------------------------------------------------- Patient/Caregiver Education Details Patient Name: Date of Service: [address], [name] [name] 8/21/2024andnbsp4:00 PM Medical Record Number: [id number] Patient Account Number: [id number] Date of Birth/Gender: Treating RN: 21-Oct-1960 (62 y.o. [name] Primary Care Physician: [name] Other Clinician: Referring Physician: Treating Physician/Extender: [name] in Treatment: [phone] Education Assessment Education Provided To: Patient [name], [name] ([id number]) [id number] Page 6 of 7 Education Topics Provided Wound/Skin Impairment: Methods: Explain/Verbal Responses: State content correctly Electronic Signature(s) Signed: 12/03/2022 2:02:59 PM By: Brenton Grills Entered By: Brenton Grills on 11/27/2022 16:14:51 -------------------------------------------------------------------------------- Wound Assessment Details Patient Name: Date of Service: [name], [name] 11/27/2022 4:00 PM Medical Record Number: [id number] Patient Account Number: [id number] Date of Birth/Sex: Treating RN: [birth year] (62 y.o. [name] Primary Care Caleesi Kohl: [name] Other Clinician: Referring Makenzie Weisner: Treating Lucindia Lemley/Extender: [name] in Treatment: 44 Wound Status Wound Number: 3 Primary Venous Leg Ulcer Etiology: Wound Location: Left, Posterior Lower Leg Wound  Status: Open Wounding Event: Trauma Comorbid Anemia, Sleep Apnea, Hypertension, Peripheral Venous Date Acquired: 06/28/2022 History: Disease Weeks Of Treatment: 16 Clustered Wound: No Photos Wound Measurements Length: (cm) 0.3 Width: (cm) 0.3 Depth: (cm) 0.1 Area: (cm) 0.071 Volume: (cm) 0.007 % Reduction in Area: 94.6% % Reduction in Volume: 94.7% Epithelialization: Large (67-100%) Tunneling: No Undermining: No Wound Description Classification: Full Thickness Without Exposed Suppor Wound Margin: Flat and Intact Exudate Amount: Small Exudate Type: Serosanguineous Exudate Color: red, brown t Structures Foul Odor After Cleansing: No Slough/Fibrino Yes Wound Bed Granulation Amount: Small (1-33%) Exposed Structure Granulation Quality: Red Fascia Exposed: No Necrotic Amount: Large (67-100%) Fat Layer (Subcutaneous Tissue) Exposed: Yes Necrotic Quality: Eschar, Adherent Slough Tendon Exposed: No Muscle Exposed: No Joint Exposed: No Bone Exposed: No [name], [name] ([id number]) [id number] Page 7 of 7 Periwound Skin Texture Texture Color No Abnormalities Noted: Yes No Abnormalities Noted: Yes Moisture Temperature / Pain No Abnormalities Noted: Yes Temperature: No Abnormality Tenderness on Palpation: Yes Treatment Notes Wound #3 (Lower Leg) Wound Laterality: Left, Posterior Cleanser Peri-Wound Care Topical Gentamicin Discharge Instruction: As directed by physician Mupirocin Ointment Discharge Instruction: Apply Mupirocin (Bactroban) as instructed Primary Dressing Promogran Prisma Matrix, 4.34 (sq in) (silver collagen) Discharge Instruction: Moisten collagen with saline or hydrogel Secondary Dressing Woven Gauze Sponges 2x2 in Discharge Instruction: Apply over primary dressing as directed. Secured With Compression Wrap Urgo K2 Lite, (equivalent to a 3 layer) two layer compression system, regular Discharge Instruction: Apply Urgo K2  Lite as directed (alternative to 3 layer compression). Compression Stockings Add-Ons Electronic Signature(s) Signed: 12/03/2022 2:02:59 PM By: Brenton Grills Entered By: Brenton Grills on 11/27/2022 16:12:10 -------------------------------------------------------------------------------- Vitals Details Patient Name: Date of Service: [name], [name] 11/27/2022 4:00 PM Medical Record Number: [id number] Patient Account Number: [id number] Date of Birth/Sex: Treating RN: 12-30-1960 (62 y.o. [name] Primary Care Elson Ulbrich: [name] Other Clinician: Referring Demetrus Pavao: Treating  Trenell Concannon/Extender: [name] in Treatment: 44 Vital Signs Time Taken: 16:00 Temperature (F): 97.6 Height (in): 61 Pulse (bpm): 92 Weight (lbs): 277 Respiratory Rate (breaths/min): 18 Body Mass Index (BMI): 52.3 Blood Pressure (mmHg): 96/51 Reference Range: 80 - 120 mg / dl Electronic Signature(s) Signed: 12/03/2022 2:02:59 PM By: Brenton Grills Entered By: Brenton Grills on 11/27/2022 16:03:26

## 2022-12-03 NOTE — Progress Notes (Signed)
[name], [name] ([id number]) [id number] Page 1 of 12 Visit Report for 11/13/2022 Chief Complaint Document Details Patient Name: Date of Service: [place], [name]. 11/13/2022 11:45 A M Medical Record Number: [id number] Patient Account Number: [id number] Date of Birth/Sex: Treating RN: April 09, 1960 (62 y.o. F) Primary Care Provider: Nira Conn Other Clinician: Referring Provider: Treating Provider/Extender: Andrey Campanile in Treatment: 42 Information Obtained from: Patient Chief Complaint RLE ulcer Electronic Signature(s) Signed: 11/13/2022 12:19:45 PM By: [name] MD FACS Entered By: [name] on 11/13/2022 12:19:45 -------------------------------------------------------------------------------- Debridement Details Patient Name: Date of Service: [name], [name] 11/13/2022 11:45 A M Medical Record Number: [id number] Patient Account Number: [id number] Date of Birth/Sex: Treating RN: Oct 26, 1960 (62 y.o. Gevena Mart Primary Care Provider: Nira Conn Other Clinician: Referring Provider: Treating Provider/Extender: Andrey Campanile in Treatment: 42 Debridement Performed for Assessment: Wound #3 Left,Posterior Lower Leg Performed By: Physician [name], MD Debridement Type: Debridement Severity of Tissue Pre Debridement: Fat layer exposed Level of Consciousness (Pre-procedure): Awake and Alert Pre-procedure Verification/Time Out Yes - 12:08 Taken: Start Time: 12:09 Pain Control: Lidocaine 4% Topical Solution Percent of Wound Bed Debrided: 100% T Area Debrided (cm): otal 0.42 Tissue and other material debrided: Non-Viable, Eschar, Slough, Slough Level: Non-Viable Tissue Debridement Description: Selective/Open Wound Instrument: Curette Bleeding: Minimum Hemostasis Achieved: Pressure End Time: 12:11 Procedural Pain: 0 Post Procedural Pain: 0 Response to Treatment:  Procedure was tolerated well Level of Consciousness (Post- Awake and Alert procedure): Post Debridement Measurements of Total Wound Length: (cm) 0.9 Width: (cm) 0.6 Depth: (cm) 0.1 Volume: (cm) 0.042 Character of Wound/Ulcer Post Debridement: Improved [name], [name] ([id number]) [id number] Page 2 of 12 Severity of Tissue Post Debridement: Fat layer exposed Post Procedure Diagnosis Same as Pre-procedure Notes Scribed for Dr Lady Gary by Brenton Grills RN Electronic Signature(s) Signed: 11/13/2022 12:22:30 PM By: [name] MD FACS Signed: 12/03/2022 2:03:55 PM By: Brenton Grills Entered By: Brenton Grills on 11/13/2022 12:10:43 -------------------------------------------------------------------------------- HPI Details Patient Name: Date of Service: [name], [name] 11/13/2022 11:45 A M Medical Record Number: [id number] Patient Account Number: [id number] Date of Birth/Sex: Treating RN: 1960-11-26 (62 y.o. F) Primary Care Provider: Nira Conn Other Clinician: Referring Provider: Treating Provider/Extender: Andrey Campanile in Treatment: 42 History of Present Illness HPI Description: 02/16/2020 upon evaluation today patient actually appears to be doing poorly in regard to her left medial lower extremity ulcer. This is actually an area that she tells me she has had intermittent issues with over the years although has been closed for some time she typically uses compression right now she has juxta lite compression wraps. With that being said she tells me that this nonetheless open several weeks/months ago and has been given her trouble since. She does have a history of chronic venous insufficiency she is seeing specialist for this in the past she has had an ablation as well as sclerotherapy. With that being said she also has hypertension chronically which is managed by her primary care provider. In general she seems to  be worsening overall with regard to the wound and states that she finally realized that she needed to come in and have somebody look at this and not continue to try to manage this on her own. No fevers, chills, nausea, vomiting, or diarrhea. 02/23/2020 on evaluation today patient appears to be doing well with regard to her wound. This is showing some signs of improvement which is great news still were not quite  at the point where I would like to be as far as the overall appearance of the wound is concerned but I do believe this is better than last week. I do believe the Iodoflex is helping as well. 03/08/2020 upon evaluation today patient appears to be doing well with regard to her wound. She has been tolerating the dressing changes without complication. Fortunately I feel like she has made great progress with the Iodoflex but I feel like it may be the point rest to switch to something else possibly a collagen- based dressing at this time. 03/15/2020 upon evaluation today patient appears to be doing excellent in regard to her leg ulcer. She has been tolerating the dressing changes without complication. Fortunately there is no signs of active infection. Overall she is measuring a little bit smaller today which is great news. 03/22/2020 upon evaluation today patient appears to be doing well with regard to her wound. She has been tolerating the dressing changes without complication. Fortunately there is no signs of active infection at this time. 03/28/2020; patient I do not usually see however she has a wound on the left anterior lower leg secondary to chronic venous insufficiency we have been using silver collagen under compression. She arrives in clinic with a nonviable surface requiring debridement 04/12/2020 upon evaluation today patient appears to be doing well all things considered with regard to her leg ulcer. She is tolerating the dressing changes without complication there is minimal dry skin  around the edges of the wound that may be trapping and stopping some of the events of the new skin I am can work on that today. Otherwise the surface of the wound appears to be doing excellent. 04/19/2020 upon evaluation today patient appears to be doing well with regard to her leg ulcer. She has been tolerating dressing changes without complication. Fortunately there is no signs of active infection at this time. No fever chills noted overall very pleased with how things seem to be progressing. 04/26/2020 on evaluation today patient appears to be doing well with regard to her wound currently. Is showing signs of excellent improvement overall is filling in nicely and there does not appear to be any signs of infection. No fevers, chills, nausea, vomiting, or diarrhea. 05/03/2020 upon evaluation today patient appears to be doing well with regard to her wound on the leg. This overall showing signs of good improvement which is great she has some good epithelial growth and overall I think that things are moving in the correct direction. We likewise going to continue with the wound care measures as before since she seems to making such good improvement. 2/3; venous wound on the left medial leg. This is contracting. We are using Prisma and 3 layer compression. She has a stocking and waiting in the eventuality this heals. She is already using it on the right 05/17/2020 upon evaluation today patient appears to be doing well with regard to her leg ulcer. She has been tolerating the dressing changes without complication. Fortunately there is no signs of active infection which is great news and overall very pleased with where things stand today. No fevers, chills, nausea, vomiting, or diarrhea. 05/24/2020 upon evaluation today patient appears to be doing well with regard to her wound. Overall I feel like she is making excellent progress. There does not appear to be any signs of active infection which is great  news. 05/31/2020 upon evaluation today patient appears to be doing well with regard to her wound. There does not  appear to be any signs of active infection which is great news overall I am extremely pleased with where things stand today. [name], [name] ([id number]) [id number] Page 3 of 12 READMISSION 01/23/2022 She returns to clinic today with a new wound on her right posterior calf. She says that she was cleaning out an old shed near the middle of August this year and then noticed what seemed to be a bug bite on her right posterior calf. It was itchy, red, and raised. By the end of September, an ulcer had developed. She has been applying various topical creams such as hydrocortisone and others to the site. When it was not improving, she made an appointment in the wound care center. ABI in clinic today was 0.97. On her right posterior calf, there is a circular wound with necrotic fat and black eschar present. There is no purulent drainage or malodor. The periwound skin is in good condition with just a little induration that appears to be secondary to inflammation. 01/31/2022: The wound measures a little bit larger today, but overall is quite a bit cleaner. There is some undermining from 9:00 to 3:00. She is having some periwound itching and says that her wrap slid. 02/08/2022: Despite using an Unna boot first layer at the top of her wrap, it slid again and it looks like there is been some bruising at the wound site. The wound is about the same size in terms of dimensions. There is a fair amount of slough and nonviable tissue still present. Edema control is better than last week. 02/15/2022: The wound dimensions are about the same. There is less nonviable tissue present. The periwound erythema has improved. 02/22/2022: The wound has deteriorated over the past week. It is larger and the periwound is more edematous, erythematous, and indurated. It looks as though there has  been more tissue breakdown with undermining present. She is having more pain. There is a foul odor coming from the wound. 02/26/2022: The wound looks quite a bit better today and the odor is gone. I still do not have her culture data back, but she seems to be responding well to the Augmentin. 03/06/2022: Her wound continues to improve. There is still some slough accumulation, but the periwound skin is less inflamed. Her culture returned with a polymicrobial population including Pseudomonas and so levofloxacin was also prescribed. She did not understand why a second antibiotic was being added so she has not yet initiated this. 03/14/2022: The wound is about the same, to perhaps a little bit larger. There is a fair amount of slough accumulation on the surface, as well as some hypertrophic granulation tissue. She is still taking levofloxacin, but has completed taking Augmentin. She has her Jodie Echevaria compound with her today. 12/14; the patient has a significant circular wound on the posterior right calf. She is using Keystone and silver alginate under 3 layer compression. Her ABIs were within normal limits at 0.97. This may have been traumatic or an insect bite at the start I reviewed these records. 04/04/2022: Since I last saw the wound, it has contracted considerably. There is a fairly thick layer of slough on the surface, as it has not been debrided since the last time I did it. Edema control is excellent and the periwound skin is in much better condition. 04/12/2022: No significant change in the wound dimensions. It is filling with granulation tissue. Still with slough accumulation on the surface. 04/19/2022: The wound is smaller this week. There is some slough accumulation on  the surface. 04/26/2022: The wound is smaller again this week and significantly cleaner. The wound surface is a little bit drier than ideal. 05/03/2022: The wound measurements were about the same, but visually it appears smaller. There  is still some undermining at the top of the wound. Moisture balance is better this week. 05/10/2022: The wound measured smaller today. There is still a fair amount of undermining present. Slough has built up on the surface. We are still awaiting snap VAC approval. 05/17/2022: The wound is a little bit smaller today. There is less slough on the surface, but the granulation tissue still is not very robust. She has been approved for snap VAC but will have a 20% coinsurance and it is not clear what that amount would end up being for her. 05/27/2022: The surface of the wound has deteriorated and it is gray and fibrotic. No significant odor, but the drainage on her dressing was a little bit purulent. 05/31/2022: The wound looks quite a bit better today. It is still a little fibrotic but no longer has purulent-looking drainage. The color is better. She spoke with her insurance company and is interested in trying the snap VAC, now that she is aware of the cost to her. 06/07/2022: After 1 week in the snap VAC, there has been substantial improvement to her wound. The undermined portion is closing in. The surface has a healthier color and appearance. There is very minimal slough accumulation. 06/14/2022: The more shallow part of the undermined portion of her wound has closed. There is still some undermining from about 1-2 o'clock. The surface continues to improve. Minimal slough accumulation. 06/28/2022: The wound is shallower this week and the undermining has essentially closed and completely. The surface tissue is improving. 07/05/2022: The depth is almost immeasurable and the surface is nearly flush with the surrounding skin. The undermining has been eliminated. There is light slough on the wound surface. 07/12/2022: There is a little bit of slough on the wound surface. The depth is about the same as last week. 07/19/2022: The wound is essentially flush with the surrounding skin surface. There is slight slough present. No  real change in the AP or transverse dimensions. 07/26/2022: The wound measured a little bit smaller today. It is flush with the surrounding skin surface and there is good granulation tissue present. Minimal slough and biofilm accumulation. 08/02/2022: The right posterior leg wound is a little bit smaller. There is a little slough accumulation. She reported a new wound to [place] today. It is on her left posterior calf. It has been present for about 6 weeks. She is not sure of how it occurred. She has been trying to manage it at home with topical Neosporin and Band-Aids. The fat layer is exposed. The surface is fibrotic and covered with slough. 08/09/2022: The right posterior leg wound is smaller again this week. There is good granulation tissue on the surface with minimal slough accumulation. The left posterior calf wound has built up a thick layer of slough. It is still quite fibrotic underneath the slough, but there is a little bit of a pink color beginning to emerge. Edema control is good. 08/16/2022: Both wounds are smaller this week. There is minimal slough on the right posterior leg wound. There is slough that has reaccumulated on the left posterior calf wound but there is a little bit more granulation tissue emerging. 08/23/2022: Both wounds are about the same size this week, but the quality of the tissue and amount of granulation tissue  on the left are both better. 08/30/2022: Both wounds measure smaller today, but there has been some tissue breakdown adjacent to the left leg wound. She says it has been itchy. There is a little bit of slough on the surface of both wounds. 09/06/2022: The right wound measures smaller today. There is minimal slough on the wound surface. The left wound is about the same size. The periwound tissue looks better this week. There is still a layer of fibrinous slough on the left wound. Edema control is good bilaterally. [name], [name] ([id number])  [id number] Page 4 of 12 6/7 the patient has wounds on bilateral calfs. We are using silver alginate on the right Iodoflex on the left under Urgo lite compression. The wounds are measuring smaller. 09/20/2022: The right wound is flush with the surrounding skin. There is good granulation tissue with some biofilm and thin eschar present. On the left, there is some slough accumulation and the patient says it has been a little itchy this week. 09/27/2022: No significant change to the right wound. The left wound measures a little smaller, but still has some depth to it with slough accumulation. 10/04/2022: Nice improvement in both wounds. They are smaller and more superficial. The left wound has better granulation tissue and no longer has the hard fibrous surface. 10/11/2022: Both wounds continue to contract. There is minimal slough and eschar present. Granulation tissue continues to fill in on the left. Edema control is good. 10/21/2022: The right leg wound is about half the size as it was last week. There is a little perimeter eschar and some light slough on the surface. The left leg wound is about the same size, but shallower. There is still slough accumulation but the buds of granulation tissue are getting larger. Edema control remains excellent. 10/30/2022: The right leg wound is healed. The left leg wound is smaller with better granulation tissue and only a little bit of slough present. Edema control is excellent. 11/07/2022: The left leg wound continues to contract and the surface continues to improve. Minimal slough with good granulation tissue filling in. 11/13/2022: Most of the left leg wound has epithelialized. There is a small open area remaining that is about a millimeter in each dimension. There is slough accumulation present. Electronic Signature(s) Signed: 11/13/2022 12:20:36 PM By: [name] MD FACS Entered By: [name] on 11/13/2022  12:20:36 -------------------------------------------------------------------------------- Physical Exam Details Patient Name: Date of Service: [name], [name] 11/13/2022 11:45 A M Medical Record Number: [id number] Patient Account Number: [id number] Date of Birth/Sex: Treating RN: Oct 05, 1960 (62 y.o. F) Primary Care Provider: Nira Conn Other Clinician: Referring Provider: Treating Provider/Extender: Andrey Campanile in Treatment: 42 Constitutional . . . . no acute distress. Respiratory Normal work of breathing on room air. Notes 11/13/2022: Most of the left leg wound has epithelialized. There is a small open area remaining that is about a millimeter in each dimension. There is slough accumulation present. Electronic Signature(s) Signed: 11/13/2022 12:21:05 PM By: [name] MD FACS Entered By: [name] on 11/13/2022 12:21:05 -------------------------------------------------------------------------------- Physician Orders Details Patient Name: Date of Service: [name], [name] 11/13/2022 11:45 A M Medical Record Number: [id number] Patient Account Number: [id number] Date of Birth/Sex: Treating RN: 01/14/1961 (62 y.o. Gevena Mart Primary Care Provider: Nira Conn Other Clinician: Referring Provider: Treating Provider/Extender: Andrey Campanile in Treatment: [address] 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 - Dr. Lady Gary RM 3 Return A Anesthetic (In clinic) Topical Lidocaine 4% applied to wound bed Bathing/ Shower/ Hygiene May shower with protection but do not get  wound dressing(s) wet. Protect dressing(s) with water repellant cover (for example, large plastic bag) or a cast cover and may then take shower. Edema Control - Lymphedema / SCD / Other Bilateral Lower Extremities Elevate legs to the level of the heart or above for 30 minutes daily and/or when sitting for 3-4 times a day throughout the day. Avoid standing for long periods of time. Exercise regularly Moisturize legs daily. Compression stocking or Garment 20-30 mm/Hg pressure to: - right leg daily Wound Treatment Wound #3 - Lower Leg Wound Laterality: Left, Posterior Cleanser: Soap and Water 1 x Per Week/30 Days Discharge Instructions: May shower and wash wound with dial antibacterial soap and water prior to dressing change. Cleanser: Wound Cleanser 1 x Per Week/30 Days Discharge Instructions: Cleanse the wound with wound cleanser prior to applying a clean dressing using gauze sponges, not tissue or cotton balls. Peri-Wound Care: Sween Lotion (Moisturizing lotion) 1 x Per Week/30 Days Discharge Instructions: Apply moisturizing lotion as directed Topical: Gentamicin 1 x Per Week/30 Days Discharge Instructions: As directed by physician Topical: Mupirocin Ointment 1 x Per Week/30 Days Discharge Instructions: Apply Mupirocin (Bactroban) as instructed Prim Dressing: Promogran Prisma Matrix, 4.34 (sq in) (silver collagen) 1 x Per Week/30 Days ary Discharge Instructions: Moisten collagen with saline or hydrogel Secondary Dressing: Woven Gauze Sponge, Non-Sterile 4x4 in 1 x Per Week/30 Days Discharge Instructions: Apply over primary dressing as directed. Compression Wrap: Urgo K2 Lite, (equivalent to a 3 layer) two layer compression system, regular 1 x Per Week/30 Days Discharge Instructions: Apply Urgo K2 Lite as directed (alternative to 3 layer compression). Electronic Signature(s) Signed: 11/13/2022 12:22:30 PM By: [name] MD FACS Entered By: [name] on 11/13/2022  12:21:19 -------------------------------------------------------------------------------- Problem List Details Patient Name: Date of Service: [name], [name] 11/13/2022 11:45 A M Medical Record Number: [id number] Patient Account Number: [id number] Date of Birth/Sex: Treating RN: 09-16-1960 (62 y.o. Gevena Mart Primary Care Provider: Nira Conn Other Clinician: Referring Provider: Treating Provider/Extender: Andrey Campanile in Treatment: [address] [place] H ([id number]) [id number] Page 6 of 12 Active Problems ICD-10 Encounter Code Description Active Date MDM Diagnosis L97.222 Non-pressure chronic ulcer of left calf with fat layer exposed 08/02/2022 No Yes E66.01 Morbid (severe) obesity due to excess calories 01/23/2022 No Yes I10 Essential (primary) hypertension 01/23/2022 No Yes R60.0 Localized edema 01/23/2022 No Yes Inactive Problems Resolved Problems ICD-10 Code Description Active Date Resolved Date L97.212 Non-pressure chronic ulcer of right calf with fat layer exposed 01/23/2022 01/23/2022 Electronic Signature(s) Signed: 11/13/2022 12:14:21 PM By: [name] MD FACS Entered By: [name] on 11/13/2022 12:14:21 -------------------------------------------------------------------------------- Progress Note Details Patient Name: Date of Service: [name], [name] 11/13/2022 11:45 A M Medical Record Number: [id number] Patient Account Number: [id number] Date of Birth/Sex: Treating RN: 03-Jun-1960 (62 y.o. F) Primary Care Provider: Nira Conn Other Clinician: Referring Provider: Treating Provider/Extender: Andrey Campanile in Treatment: 42 Subjective Chief Complaint Information obtained from Patient RLE ulcer History of Present Illness (HPI) 02/16/2020 upon evaluation today patient actually appears to be doing poorly in regard to her left medial lower extremity ulcer.  This is  actually an area that she tells me she has had intermittent issues with over the years although has been closed for some time she typically uses compression right now she has juxta lite compression wraps. With that being said she tells me that this nonetheless open several weeks/months ago and has been given her trouble since. She does have a history of chronic venous insufficiency she is seeing specialist for this in the past she has had an ablation as well as sclerotherapy. With that being said she also has hypertension chronically which is managed by her primary care provider. In general she seems to be worsening overall with regard to the wound and states that she finally realized that she needed to come in and have somebody look at this and not continue to try to manage this on her own. No fevers, chills, nausea, vomiting, or diarrhea. 02/23/2020 on evaluation today patient appears to be doing well with regard to her wound. This is showing some signs of improvement which is great news still were not quite at the point where I would like to be as far as the overall appearance of the wound is concerned but I do believe this is better than last week. I do believe the Iodoflex is helping as well. 03/08/2020 upon evaluation today patient appears to be doing well with regard to her wound. She has been tolerating the dressing changes without complication. Fortunately I feel like she has made great progress with the Iodoflex but I feel like it may be the point rest to switch to something else possibly a collagen- based dressing at this time. YERALDI, ALBANI ([id number]) [id number] Page 7 of 12 03/15/2020 upon evaluation today patient appears to be doing excellent in regard to her leg ulcer. She has been tolerating the dressing changes without complication. Fortunately there is no signs of active infection. Overall she is measuring a little bit smaller today which is  great news. 03/22/2020 upon evaluation today patient appears to be doing well with regard to her wound. She has been tolerating the dressing changes without complication. Fortunately there is no signs of active infection at this time. 03/28/2020; patient I do not usually see however she has a wound on the left anterior lower leg secondary to chronic venous insufficiency we have been using silver collagen under compression. She arrives in clinic with a nonviable surface requiring debridement 04/12/2020 upon evaluation today patient appears to be doing well all things considered with regard to her leg ulcer. She is tolerating the dressing changes without complication there is minimal dry skin around the edges of the wound that may be trapping and stopping some of the events of the new skin I am can work on that today. Otherwise the surface of the wound appears to be doing excellent. 04/19/2020 upon evaluation today patient appears to be doing well with regard to her leg ulcer. She has been tolerating dressing changes without complication. Fortunately there is no signs of active infection at this time. No fever chills noted overall very pleased with how things seem to be progressing. 04/26/2020 on evaluation today patient appears to be doing well with regard to her wound currently. Is showing signs of excellent improvement overall is filling in nicely and there does not appear to be any signs of infection. No fevers, chills, nausea, vomiting, or diarrhea. 05/03/2020 upon evaluation today patient appears to be doing well with regard to her wound on the leg. This overall showing signs of good improvement which is  great she has some good epithelial growth and overall I think that things are moving in the correct direction. We likewise going to continue with the wound care measures as before since she seems to making such good improvement. 2/3; venous wound on the left medial leg. This is contracting. We are  using Prisma and 3 layer compression. She has a stocking and waiting in the eventuality this heals. She is already using it on the right 05/17/2020 upon evaluation today patient appears to be doing well with regard to her leg ulcer. She has been tolerating the dressing changes without complication. Fortunately there is no signs of active infection which is great news and overall very pleased with where things stand today. No fevers, chills, nausea, vomiting, or diarrhea. 05/24/2020 upon evaluation today patient appears to be doing well with regard to her wound. Overall I feel like she is making excellent progress. There does not appear to be any signs of active infection which is great news. 05/31/2020 upon evaluation today patient appears to be doing well with regard to her wound. There does not appear to be any signs of active infection which is great news overall I am extremely pleased with where things stand today. READMISSION 01/23/2022 She returns to clinic today with a new wound on her right posterior calf. She says that she was cleaning out an old shed near the middle of August this year and then noticed what seemed to be a bug bite on her right posterior calf. It was itchy, red, and raised. By the end of September, an ulcer had developed. She has been applying various topical creams such as hydrocortisone and others to the site. When it was not improving, she made an appointment in the wound care center. ABI in clinic today was 0.97. On her right posterior calf, there is a circular wound with necrotic fat and black eschar present. There is no purulent drainage or malodor. The periwound skin is in good condition with just a little induration that appears to be secondary to inflammation. 01/31/2022: The wound measures a little bit larger today, but overall is quite a bit cleaner. There is some undermining from 9:00 to 3:00. She is having some periwound itching and says that her wrap  slid. 02/08/2022: Despite using an Unna boot first layer at the top of her wrap, it slid again and it looks like there is been some bruising at the wound site. The wound is about the same size in terms of dimensions. There is a fair amount of slough and nonviable tissue still present. Edema control is better than last week. 02/15/2022: The wound dimensions are about the same. There is less nonviable tissue present. The periwound erythema has improved. 02/22/2022: The wound has deteriorated over the past week. It is larger and the periwound is more edematous, erythematous, and indurated. It looks as though there has been more tissue breakdown with undermining present. She is having more pain. There is a foul odor coming from the wound. 02/26/2022: The wound looks quite a bit better today and the odor is gone. I still do not have her culture data back, but she seems to be responding well to the Augmentin. 03/06/2022: Her wound continues to improve. There is still some slough accumulation, but the periwound skin is less inflamed. Her culture returned with a polymicrobial population including Pseudomonas and so levofloxacin was also prescribed. She did not understand why a second antibiotic was being added so she has not yet initiated this.  03/14/2022: The wound is about the same, to perhaps a little bit larger. There is a fair amount of slough accumulation on the surface, as well as some hypertrophic granulation tissue. She is still taking levofloxacin, but has completed taking Augmentin. She has her Jodie Echevaria compound with her today. 12/14; the patient has a significant circular wound on the posterior right calf. She is using Keystone and silver alginate under 3 layer compression. Her ABIs were within normal limits at 0.97. This may have been traumatic or an insect bite at the start I reviewed these records. 04/04/2022: Since I last saw the wound, it has contracted considerably. There is a fairly thick  layer of slough on the surface, as it has not been debrided since the last time I did it. Edema control is excellent and the periwound skin is in much better condition. 04/12/2022: No significant change in the wound dimensions. It is filling with granulation tissue. Still with slough accumulation on the surface. 04/19/2022: The wound is smaller this week. There is some slough accumulation on the surface. 04/26/2022: The wound is smaller again this week and significantly cleaner. The wound surface is a little bit drier than ideal. 05/03/2022: The wound measurements were about the same, but visually it appears smaller. There is still some undermining at the top of the wound. Moisture balance is better this week. 05/10/2022: The wound measured smaller today. There is still a fair amount of undermining present. Slough has built up on the surface. We are still awaiting snap VAC approval. 05/17/2022: The wound is a little bit smaller today. There is less slough on the surface, but the granulation tissue still is not very robust. She has been approved for snap VAC but will have a 20% coinsurance and it is not clear what that amount would end up being for her. 05/27/2022: The surface of the wound has deteriorated and it is gray and fibrotic. No significant odor, but the drainage on her dressing was a little bit purulent. 05/31/2022: The wound looks quite a bit better today. It is still a little fibrotic but no longer has purulent-looking drainage. The color is better. She spoke with her insurance company and is interested in trying the snap VAC, now that she is aware of the cost to her. 06/07/2022: After 1 week in the snap VAC, there has been substantial improvement to her wound. The undermined portion is closing in. The surface has a healthier color and appearance. There is very minimal slough accumulation. [name], [name] ([id number]) [id number] Page 8 of 12 06/14/2022: The more shallow  part of the undermined portion of her wound has closed. There is still some undermining from about 1-2 o'clock. The surface continues to improve. Minimal slough accumulation. 06/28/2022: The wound is shallower this week and the undermining has essentially closed and completely. The surface tissue is improving. 07/05/2022: The depth is almost immeasurable and the surface is nearly flush with the surrounding skin. The undermining has been eliminated. There is light slough on the wound surface. 07/12/2022: There is a little bit of slough on the wound surface. The depth is about the same as last week. 07/19/2022: The wound is essentially flush with the surrounding skin surface. There is slight slough present. No real change in the AP or transverse dimensions. 07/26/2022: The wound measured a little bit smaller today. It is flush with the surrounding skin surface and there is good granulation tissue present. Minimal slough and biofilm accumulation. 08/02/2022: The right posterior leg wound is a  little bit smaller. There is a little slough accumulation. She reported a new wound to [place] today. It is on her left posterior calf. It has been present for about 6 weeks. She is not sure of how it occurred. She has been trying to manage it at home with topical Neosporin and Band-Aids. The fat layer is exposed. The surface is fibrotic and covered with slough. 08/09/2022: The right posterior leg wound is smaller again this week. There is good granulation tissue on the surface with minimal slough accumulation. The left posterior calf wound has built up a thick layer of slough. It is still quite fibrotic underneath the slough, but there is a little bit of a pink color beginning to emerge. Edema control is good. 08/16/2022: Both wounds are smaller this week. There is minimal slough on the right posterior leg wound. There is slough that has reaccumulated on the left posterior calf wound but there is a little bit more granulation  tissue emerging. 08/23/2022: Both wounds are about the same size this week, but the quality of the tissue and amount of granulation tissue on the left are both better. 08/30/2022: Both wounds measure smaller today, but there has been some tissue breakdown adjacent to the left leg wound. She says it has been itchy. There is a little bit of slough on the surface of both wounds. 09/06/2022: The right wound measures smaller today. There is minimal slough on the wound surface. The left wound is about the same size. The periwound tissue looks better this week. There is still a layer of fibrinous slough on the left wound. Edema control is good bilaterally. 6/7 the patient has wounds on bilateral calfs. We are using silver alginate on the right Iodoflex on the left under Urgo lite compression. The wounds are measuring smaller. 09/20/2022: The right wound is flush with the surrounding skin. There is good granulation tissue with some biofilm and thin eschar present. On the left, there is some slough accumulation and the patient says it has been a little itchy this week. 09/27/2022: No significant change to the right wound. The left wound measures a little smaller, but still has some depth to it with slough accumulation. 10/04/2022: Nice improvement in both wounds. They are smaller and more superficial. The left wound has better granulation tissue and no longer has the hard fibrous surface. 10/11/2022: Both wounds continue to contract. There is minimal slough and eschar present. Granulation tissue continues to fill in on the left. Edema control is good. 10/21/2022: The right leg wound is about half the size as it was last week. There is a little perimeter eschar and some light slough on the surface. The left leg wound is about the same size, but shallower. There is still slough accumulation but the buds of granulation tissue are getting larger. Edema control remains excellent. 10/30/2022: The right leg wound is healed.  The left leg wound is smaller with better granulation tissue and only a little bit of slough present. Edema control is excellent. 11/07/2022: The left leg wound continues to contract and the surface continues to improve. Minimal slough with good granulation tissue filling in. 11/13/2022: Most of the left leg wound has epithelialized. There is a small open area remaining that is about a millimeter in each dimension. There is slough accumulation present. Patient History Information obtained from Patient. Family History Cancer - Mother, Diabetes - Father, Hypertension - Mother, Kidney Disease - Mother, Lung Disease - Father, Thyroid Problems - Paternal Grandparents, No family history  of Heart Disease, Seizures, Stroke, Tuberculosis. Social History Never smoker, Marital Status - Married, Alcohol Use - Never, Drug Use - No History, Caffeine Use - Daily - coffee. Medical History Eyes Denies history of Cataracts, Glaucoma, Optic Neuritis Ear/Nose/Mouth/Throat Denies history of Chronic sinus problems/congestion, Middle ear problems Hematologic/Lymphatic Patient has history of Anemia - iron Denies history of Hemophilia, Human Immunodeficiency Virus, Lymphedema, Sickle Cell Disease Respiratory Patient has history of Sleep Apnea - CPAP Denies history of Aspiration, Asthma, Chronic Obstructive Pulmonary Disease (COPD), Pneumothorax, Tuberculosis Cardiovascular Patient has history of Hypertension, Peripheral Venous Disease Denies history of Angina, Arrhythmia, Congestive Heart Failure, Coronary Artery Disease, Deep Vein Thrombosis, Hypotension, Myocardial Infarction, Peripheral Arterial Disease, Phlebitis, Vasculitis Gastrointestinal Denies history of Cirrhosis , Colitis, Crohns, Hepatitis A, Hepatitis B, Hepatitis C Endocrine Denies history of Type I Diabetes, Type II Diabetes Genitourinary Denies history of End Stage Renal Disease [name], [name] ([id number])  [id number] Page 9 of 12 Immunological Denies history of Lupus Erythematosus, Raynauds, Scleroderma Integumentary (Skin) Denies history of History of Burn Musculoskeletal Denies history of Gout, Rheumatoid Arthritis, Osteoarthritis, Osteomyelitis Neurologic Denies history of Dementia, Neuropathy, Quadriplegia, Paraplegia, Seizure Disorder Oncologic Denies history of Received Chemotherapy, Received Radiation Psychiatric Denies history of Anorexia/bulimia, Confinement Anxiety Hospitalization/Surgery History - cholecystectomy 1980s. - nephrolithasis 1980s. - plate and rod in right elbow surgery 2010. 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 Vitals Time Taken: 11:47 AM, Height: 61 in, Weight: 277 lbs, BMI: 52.3, Temperature: 98.1 F, Pulse: 93 bpm, Respiratory Rate: 18 breaths/min, Blood Pressure: 125/61 mmHg. Respiratory Normal work of breathing on room air. General Notes: 11/13/2022: Most of the left leg wound has epithelialized. There is a small open area remaining that is about a millimeter in each dimension. There is slough accumulation present. Integumentary (Hair, Skin) Wound #3 status is Open. Original cause of wound was Trauma. The date acquired was: 06/28/2022. The wound has been in treatment 14 weeks. The wound is located on the Left,Posterior Lower Leg. The wound measures 0.9cm length x 0.6cm width x 0.1cm depth; 0.424cm^2 area and 0.042cm^3 volume. There is Fat Layer (Subcutaneous Tissue) exposed. There is a small amount of serosanguineous drainage noted. The wound margin is flat and intact. There is large (67- 100%) red granulation within the wound bed. There is a small (1-33%) amount of necrotic tissue within the wound bed including Adherent Slough. The periwound skin appearance had no abnormalities noted for texture. The  periwound skin appearance had no abnormalities noted for moisture. The periwound skin appearance had no abnormalities noted for color. Periwound temperature was noted as No Abnormality. The periwound has tenderness on palpation. Assessment Active Problems ICD-10 Non-pressure chronic ulcer of left calf with fat layer exposed Morbid (severe) obesity due to excess calories Essential (primary) hypertension Localized edema Procedures Wound #3 Pre-procedure diagnosis of Wound #3 is a Venous Leg Ulcer located on the Left,Posterior Lower Leg .Severity of Tissue Pre Debridement is: Fat layer exposed. There was a Selective/Open Wound Non-Viable Tissue Debridement with a total area of 0.42 sq cm performed by [name], MD. With the following instrument(s): Curette to remove Non-Viable tissue/material. Material removed includes Eschar and Slough and after achieving pain control using Lidocaine 4% T opical Solution. No specimens were taken. A time out was conducted at 12:08, prior to the start of the procedure. A Minimum amount of bleeding was controlled with Pressure. The procedure was tolerated well with a  pain level of 0 throughout and a pain level of 0 following the procedure. Post Debridement Measurements: 0.9cm length x 0.6cm width x 0.1cm depth; 0.042cm^3 volume. Character of Wound/Ulcer Post Debridement is improved. Severity of Tissue Post Debridement is: Fat layer exposed. Post procedure Diagnosis Wound #3: Same as Pre-Procedure General Notes: Scribed for Dr Lady Gary by Brenton Grills RN. [name], [name] ([id number]) [id number] Page 10 of 12 Plan Follow-up Appointments: Return Appointment in 1 week. - Dr. Lady Gary RM 3 Anesthetic: (In clinic) Topical Lidocaine 4% applied to wound bed Bathing/ Shower/ Hygiene: May shower with protection but do not get wound dressing(s) wet. Protect dressing(s) with water repellant cover (for example, large plastic bag) or a cast  cover and may then take shower. Edema Control - Lymphedema / SCD / Other: Elevate legs to the level of the heart or above for 30 minutes daily and/or when sitting for 3-4 times a day throughout the day. Avoid standing for long periods of time. Exercise regularly Moisturize legs daily. Compression stocking or Garment 20-30 mm/Hg pressure to: - right leg daily WOUND #3: - Lower Leg Wound Laterality: Left, Posterior Cleanser: Soap and Water 1 x Per Week/30 Days Discharge Instructions: May shower and wash wound with dial antibacterial soap and water prior to dressing change. Cleanser: Wound Cleanser 1 x Per Week/30 Days Discharge Instructions: Cleanse the wound with wound cleanser prior to applying a clean dressing using gauze sponges, not tissue or cotton balls. Peri-Wound Care: Sween Lotion (Moisturizing lotion) 1 x Per Week/30 Days Discharge Instructions: Apply moisturizing lotion as directed Topical: Gentamicin 1 x Per Week/30 Days Discharge Instructions: As directed by physician Topical: Mupirocin Ointment 1 x Per Week/30 Days Discharge Instructions: Apply Mupirocin (Bactroban) as instructed Prim Dressing: Promogran Prisma Matrix, 4.34 (sq in) (silver collagen) 1 x Per Week/30 Days ary Discharge Instructions: Moisten collagen with saline or hydrogel Secondary Dressing: Woven Gauze Sponge, Non-Sterile 4x4 in 1 x Per Week/30 Days Discharge Instructions: Apply over primary dressing as directed. Com pression Wrap: Urgo K2 Lite, (equivalent to a 3 layer) two layer compression system, regular 1 x Per Week/30 Days Discharge Instructions: Apply Urgo K2 Lite as directed (alternative to 3 layer compression). 11/13/2022: Most of the left leg wound has epithelialized. There is a small open area remaining that is about a millimeter in each dimension. There is slough accumulation present. Placed a curette to debride slough from the wound. Will continue the mixture of topical gentamicin and mupirocin  with Prisma silver collagen and Urgo light compression wrap. Follow-up in 1 week. Electronic Signature(s) Signed: 11/13/2022 12:21:48 PM By: [name] MD FACS Entered By: [name] on 11/13/2022 12:21:48 -------------------------------------------------------------------------------- HxROS Details Patient Name: Date of Service: [name], [name] 11/13/2022 11:45 A M Medical Record Number: [id number] Patient Account Number: [id number] Date of Birth/Sex: Treating RN: 02/14/1961 (62 y.o. F) Primary Care Provider: Nira Conn Other Clinician: Referring Provider: Treating Provider/Extender: Andrey Campanile in Treatment: 42 Information Obtained From Patient Eyes Medical History: Negative for: Cataracts; Glaucoma; Optic Neuritis Ear/Nose/Mouth/Throat Medical History: Negative for: Chronic sinus problems/congestion; Middle ear problems Hematologic/Lymphatic [name], [name] ([id number]) [id number] Page 11 of 12 Medical History: Positive for: Anemia - iron Negative for: Hemophilia; Human Immunodeficiency Virus; Lymphedema; Sickle Cell Disease Respiratory Medical History: Positive for: Sleep Apnea - CPAP Negative for: Aspiration; Asthma; Chronic Obstructive Pulmonary Disease (COPD); Pneumothorax; Tuberculosis Cardiovascular Medical History: Positive for: Hypertension; Peripheral Venous Disease Negative for: Angina; Arrhythmia; Congestive Heart Failure; Coronary Artery Disease; Deep Vein Thrombosis;  Hypotension; Myocardial Infarction; Peripheral Arterial Disease; Phlebitis; Vasculitis Gastrointestinal Medical History: Negative for: Cirrhosis ; Colitis; Crohns; Hepatitis A; Hepatitis B; Hepatitis C Endocrine Medical History: Negative for: Type I Diabetes; Type II Diabetes Genitourinary Medical History: Negative for: End Stage Renal Disease Past Medical History Notes: years ago kidney  stones Immunological Medical History: Negative for: Lupus Erythematosus; Raynauds; Scleroderma Integumentary (Skin) Medical History: Negative for: History of Burn Musculoskeletal Medical History: Negative for: Gout; Rheumatoid Arthritis; Osteoarthritis; Osteomyelitis Neurologic Medical History: Negative for: Dementia; Neuropathy; Quadriplegia; Paraplegia; Seizure Disorder Oncologic Medical History: Negative for: Received Chemotherapy; Received Radiation Past Medical History Notes: skin Ca removed from back years ago Psychiatric Medical History: Negative for: Anorexia/bulimia; Confinement Anxiety Immunizations Pneumococcal Vaccine: Received Pneumococcal Vaccination: No Implantable Devices None Hospitalization / Surgery History Type of Hospitalization/Surgery cholecystectomy 1980s nephrolithasis 1980s [name], BABERS ([id number]) [phone].pdf Page 12 of 12 plate and rod in right elbow surgery 2010 Family and Social History Cancer: Yes - Mother; Diabetes: Yes - Father; Heart Disease: No; Hypertension: Yes - Mother; Kidney Disease: Yes - Mother; Lung Disease: Yes - Father; Seizures: No; Stroke: No; Thyroid Problems: Yes - Paternal Grandparents; Tuberculosis: No; Never smoker; Marital Status - Married; Alcohol Use: Never; Drug Use: No History; Caffeine Use: Daily - coffee; Financial Concerns: No; Food, Clothing or Shelter Needs: No; Support System Lacking: No; Transportation Concerns: No Electronic Signature(s) Signed: 11/13/2022 12:22:30 PM By: [name] MD FACS Entered By: [name] on 11/13/2022 12:20:43 -------------------------------------------------------------------------------- SuperBill Details Patient Name: Date of Service: [name], [name] 11/13/2022 Medical Record Number: [id number] Patient Account Number: [id number] Date of Birth/Sex: Treating RN: [birth year] (62 y.o. 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 Morbid (severe) obesity due to excess calories I10 Essential (primary) hypertension R60.0 Localized edema Facility Procedures : CPT4 Code: [id number] Description: 97597 - DEBRIDE WOUND 1ST 20 SQ CM OR < ICD-10 Diagnosis Description L97.222 Non-pressure chronic ulcer of left calf with fat layer exposed Modifier: Quantity: 1 Physician Procedures : CPT4 Code Description Modifier [id number] 99214 - WC PHYS LEVEL 4 - EST PT 25 ICD-10 Diagnosis Description L97.222 Non-pressure chronic ulcer of left calf with fat layer exposed R60.0 Localized edema E66.01 Morbid (severe) obesity due to excess calories  I10 Essential (primary) hypertension Quantity: 1 : [id number] 97597 - WC PHYS DEBR WO ANESTH 20 SQ CM ICD-10 Diagnosis Description L97.222 Non-pressure chronic ulcer of left calf with fat layer exposed Quantity: 1 Electronic Signature(s) Signed: 11/13/2022 12:22:08 PM By: [name] MD FACS Entered By: [name] on 11/13/2022 12:22:08

## 2022-12-03 NOTE — Progress Notes (Signed)
[name] ([id number]) [id number] Page 1 of 7 Visit Report for 11/20/2022 Arrival Information Details Patient Name: Date of Service: [name] H. 11/20/2022 11:45 A M Medical Record Number: [id number] Patient Account Number: [id number] Date of Birth/Sex: Treating RN: 1961/01/18 (62 y.o. [name] Primary Care Darrick Greenlaw: Nira Conn Other Clinician: Referring Latesa Fratto: Treating Ralphine Hinks/Extender: Andrey Campanile in Treatment: 27 Visit Information History Since Last Visit Added or deleted any medications: No Patient Arrived: Ambulatory Any new allergies or adverse reactions: No Arrival Time: 11:51 Had a fall or experienced change in No Accompanied By: self activities of daily living that may affect Transfer Assistance: None risk of falls: Patient Identification Verified: Yes Signs or symptoms of abuse/neglect since last visito No Secondary Verification Process Completed: Yes Hospitalized since last visit: No Patient Requires Transmission-Based Precautions: No Implantable device outside of the clinic excluding No Patient Has Alerts: No cellular tissue based products placed in the center since last visit: Has Dressing in Place as Prescribed: Yes Has Compression in Place as Prescribed: Yes Pain Present Now: No Electronic Signature(s) Signed: 11/20/2022 4:38:35 PM By: Samuella Bruin Entered By: Samuella Bruin on 11/20/2022 11:51:54 -------------------------------------------------------------------------------- Encounter Discharge Information Details Patient Name: Date of Service: [name], [name] 11/20/2022 11:45 A M Medical Record Number: [id number] Patient Account Number: [id number] Date of Birth/Sex: Treating RN: [birth year] (62 y.o. Gevena Mart Primary Care Tremaine Earwood: Nira Conn Other Clinician: Referring Rudine Rieger: Treating Presley Gora/Extender: Andrey Campanile in Treatment: 86 Encounter Discharge Information Items Post Procedure Vitals Discharge Condition: Stable Temperature (F): 97.8 Ambulatory Status: Ambulatory Pulse (bpm): 88 Discharge Destination: Home Respiratory Rate (breaths/min): 18 Transportation: Private Auto Blood Pressure (mmHg): 128/64 Accompanied By: self Schedule Follow-up Appointment: Yes Clinical Summary of Care: Patient Declined Electronic Signature(s) Signed: 12/03/2022 2:03:55 PM By: Brenton Grills Entered By: Brenton Grills on 11/20/2022 12:41:41 [name] ([id number]) [id number] Page 2 of 7 -------------------------------------------------------------------------------- Lower Extremity Assessment Details Patient Name: Date of Service: [name] Reader. 11/20/2022 11:45 A M Medical Record Number: [id number] Patient Account Number: [id number] Date of Birth/Sex: Treating RN: 04-23-1960 (62 y.o. [name] Primary Care Altheria Shadoan: Nira Conn Other Clinician: Referring Chrishawn Kring: Treating Kasem Mozer/Extender: Andrey Campanile in Treatment: 43 Edema Assessment Assessed: [name]: No] [Right: No] Edema: [Left: Yes] [Right: Yes] Calf Left: Right: Point of Measurement: From Medial Instep 42 cm 39 cm Ankle Left: Right: Point of Measurement: From Medial Instep 22 cm 23 cm Vascular Assessment Pulses: Dorsalis Pedis Palpable: [Left:Yes] Extremity colors, hair growth, and conditions: Extremity Color: [Left:Hyperpigmented] [Right:Hyperpigmented] Hair Growth on Extremity: [Left:No] [Right:No] Temperature of Extremity: [Left:Warm < 3 seconds] [Right:Warm < 3 seconds] Electronic Signature(s) Signed: 11/20/2022 4:38:35 PM By: Samuella Bruin Entered By: Samuella Bruin on 11/20/2022 11:53:24 -------------------------------------------------------------------------------- Multi Wound Chart Details Patient Name: Date of Service: [name], [name] 11/20/2022 11:45 A M Medical Record Number: [id number] Patient Account Number: [id number] Date of Birth/Sex: Treating RN: [birth year] (62 y.o. F) Primary Care [name]: Nira Conn Other Clinician: Referring Katherin Ramey: Treating Abundio Teuscher/Extender: Andrey Campanile in Treatment: 5 Vital Signs Height(in): 61 Pulse(bpm): 88 Weight(lbs): 277 Blood Pressure(mmHg): 155/85 Body Mass Index(BMI): 52.3 Temperature(F): 98.8 Respiratory Rate(breaths/min): 18 [3:Photos:] [N/A:N/A] Left, Posterior Lower Leg N/A N/A Wound Location: Trauma N/A N/A Wounding Event: Venous Leg Ulcer N/A N/A Primary Etiology: Anemia, Sleep Apnea, Hypertension, N/A N/A Comorbid History: Peripheral Venous Disease 06/28/2022 N/A N/A Date Acquired: 15 N/A N/A Weeks of Treatment: Open N/A N/A Wound  Status: No N/A N/A Wound Recurrence: 0.4x0.3x0.1 N/A N/A Measurements L x W x D (cm) 0.094 N/A N/A A (cm) : rea 0.009 N/A N/A Volume (cm) : 92.90% N/A N/A % Reduction in Area: 93.20% N/A N/A % Reduction in Volume: Full Thickness Without Exposed N/A N/A Classification: Support Structures Small N/A N/A Exudate A mount: Serosanguineous N/A N/A Exudate Type: red, brown N/A N/A Exudate Color: Flat and Intact N/A N/A Wound Margin: Small (1-33%) N/A N/A Granulation Amount: Red N/A N/A Granulation Quality: Large (67-100%) N/A N/A Necrotic Amount: Eschar, Adherent Slough N/A N/A Necrotic Tissue: Fat Layer (Subcutaneous Tissue): Yes N/A N/A Exposed Structures: Fascia: No Tendon: No Muscle: No Joint: No Bone: No Large (67-100%) N/A N/A Epithelialization: Rash: Yes N/A N/A Periwound Skin Texture: No Abnormalities Noted N/A N/A Periwound Skin Moisture: Erythema: No N/A N/A Periwound Skin Color: No Abnormality N/A N/A Temperature: Yes N/A N/A Tenderness on Palpation: Treatment Notes Electronic Signature(s) Signed: 11/20/2022 12:15:17 PM By: [name] MD FACS Entered By: [name] on 11/20/2022 12:15:17 -------------------------------------------------------------------------------- Multi-Disciplinary Care Plan Details Patient Name: Date of Service: [name], [name] 11/20/2022 11:45 A M Medical Record Number: [id number] Patient Account Number: [id number] Date of Birth/Sex: Treating RN: 03-Oct-1960 (62 y.o. Gevena Mart Primary Care Mattix Imhof: Nira Conn Other Clinician: Referring Cana Mignano: Treating Willey Due/Extender: Andrey Campanile in Treatment: 73 Multidisciplinary Care Plan reviewed with physician Active Inactive Necrotic Tissue Nursing Diagnoses: Impaired tissue integrity related to necrotic/devitalized tissue Knowledge deficit related to management of necrotic/devitalized tissue Goals: Necrotic/devitalized tissue will be minimized in the wound bed YUKIYE, MCMELLON ([id number]) [phone].pdf Page 4 of 7 Date Initiated: 01/23/2022 Target Resolution Date: 12/07/2022 Goal Status: Active Patient/caregiver will verbalize understanding of reason and process for debridement of necrotic tissue Date Initiated: 01/23/2022 Target Resolution Date: 12/07/2022 Goal Status: Active Interventions: Assess patient pain level pre-, during and post procedure and prior to discharge Provide education on necrotic tissue and debridement process Treatment Activities: Apply topical anesthetic as ordered : 01/23/2022 Notes: Venous Leg Ulcer Nursing Diagnoses: Knowledge deficit related to disease process and management Potential for venous Insuffiency (use before diagnosis confirmed) Goals: Patient will maintain optimal edema control Date Initiated: 07/05/2022 Target Resolution Date: 12/07/2022 Goal Status: Active Interventions: Assess peripheral edema status every visit. Compression as ordered Provide education on venous insufficiency Treatment Activities: Therapeutic  compression applied : 07/05/2022 Notes: Wound/Skin Impairment Nursing Diagnoses: Impaired tissue integrity Knowledge deficit related to ulceration/compromised skin integrity Goals: Patient/caregiver will verbalize understanding of skin care regimen Date Initiated: 01/23/2022 Target Resolution Date: 12/07/2022 Goal Status: Active Interventions: Assess patient/caregiver ability to obtain necessary supplies Assess ulceration(s) every visit Treatment Activities: Skin care regimen initiated : 01/23/2022 Topical wound management initiated : 01/23/2022 Notes: Electronic Signature(s) Signed: 12/03/2022 2:03:55 PM By: Brenton Grills Entered By: Brenton Grills on 11/20/2022 12:36:30 -------------------------------------------------------------------------------- Pain Assessment Details Patient Name: Date of Service: [name], [name] 11/20/2022 11:45 A M Medical Record Number: [id number] Patient Account Number: [id number] Date of Birth/Sex: Treating RN: 1960/11/06 (62 y.o. [name] Primary Care Lorenia Hoston: Nira Conn Other Clinician: Referring Ugo Thoma: Treating Azaliyah Kennard/Extender: Andrey Campanile in Treatment: [address], [place] H ([id number]) [id number] Page 5 of 7 Active Problems Location of Pain Severity and Description of Pain Patient Has Paino No Site Locations Rate the pain. Current Pain Level: 0 Pain Management and Medication Current Pain Management: Electronic Signature(s) Signed: 11/20/2022 4:38:35 PM By: Samuella Bruin Entered By: Samuella Bruin on 11/20/2022 11:52:43 -------------------------------------------------------------------------------- Patient/Caregiver Education Details Patient  Name: Date of Service: [name] [name] 8/14/2024andnbsp11:45 A M Medical Record Number: [id number] Patient Account Number: [id number] Date of Birth/Gender: Treating RN: 15-Jul-1960 (62 y.o. Gevena Mart Primary Care Physician: Nira Conn Other Clinician: Referring Physician: Treating Physician/Extender: Andrey Campanile in Treatment: 85 Education Assessment Education Provided To: Patient Education Topics Provided Wound/Skin Impairment: Methods: Explain/Verbal Responses: State content correctly Electronic Signature(s) Signed: 12/03/2022 2:03:55 PM By: Brenton Grills Entered By: Brenton Grills on 11/20/2022 12:36:49 [name] ([id number]) [id number] Page 6 of 7 -------------------------------------------------------------------------------- Wound Assessment Details Patient Name: Date of Service: [place], [name] 11/20/2022 11:45 A M Medical Record Number: [id number] Patient Account Number: [id number] Date of Birth/Sex: Treating RN: [birth year] (62 y.o. [name] Primary Care Purnell Daigle: Nira Conn Other Clinician: Referring Esequiel Kleinfelter: Treating Sintia Mckissic/Extender: Andrey Campanile in Treatment: 43 Wound Status Wound Number: 3 Primary Venous Leg Ulcer Etiology: Wound Location: Left, Posterior Lower Leg Wound Status: Open Wounding Event: Trauma Comorbid Anemia, Sleep Apnea, Hypertension, Peripheral Venous Date Acquired: 06/28/2022 History: Disease Weeks Of Treatment: 15 Clustered Wound: No Photos Wound Measurements Length: (cm) 0.4 Width: (cm) 0.3 Depth: (cm) 0.1 Area: (cm) 0.094 Volume: (cm) 0.009 % Reduction in Area: 92.9% % Reduction in Volume: 93.2% Epithelialization: Large (67-100%) Tunneling: No Undermining: No Wound Description Classification: Full Thickness Without Exposed Support Structures Wound Margin: Flat and Intact Exudate Amount: Small Exudate Type: Serosanguineous Exudate Color: red, brown Foul Odor After Cleansing: No Slough/Fibrino Yes Wound Bed Granulation Amount: Small (1-33%) Exposed Structure Granulation Quality: Red Fascia Exposed:  No Necrotic Amount: Large (67-100%) Fat Layer (Subcutaneous Tissue) Exposed: Yes Necrotic Quality: Eschar, Adherent Slough Tendon Exposed: No Muscle Exposed: No Joint Exposed: No Bone Exposed: No Periwound Skin Texture Texture Color No Abnormalities Noted: Yes No Abnormalities Noted: Yes Moisture Temperature / Pain No Abnormalities Noted: Yes Temperature: No Abnormality Tenderness on Palpation: Yes Electronic Signature(s) Signed: 11/20/2022 4:38:35 PM By: Samuella Bruin Entered By: Samuella Bruin on 11/20/2022 11:56:14 [name] ([id number]) [id number] Page 7 of 7 -------------------------------------------------------------------------------- Vitals Details Patient Name: Date of Service: [place], [name] 11/20/2022 11:45 A M Medical Record Number: [id number] Patient Account Number: [id number] Date of Birth/Sex: Treating RN: 1960/11/30 (62 y.o. [name] Primary Care Merrit Waugh: Nira Conn Other Clinician: Referring Kit Mollett: Treating Emalyn Schou/Extender: Andrey Campanile in Treatment: 63 Vital Signs Time Taken: 11:52 Temperature (F): 98.8 Height (in): 61 Pulse (bpm): 88 Weight (lbs): 277 Respiratory Rate (breaths/min): 18 Body Mass Index (BMI): 52.3 Blood Pressure (mmHg): 155/85 Reference Range: 80 - 120 mg / dl Electronic Signature(s) Signed: 11/20/2022 4:38:35 PM By: Samuella Bruin Entered By: Samuella Bruin on 11/20/2022 11:53:09

## 2022-12-03 NOTE — Progress Notes (Signed)
[name], [name] ([id number]) [id number] Page 1 of 7 Visit Report for 11/13/2022 Arrival Information Details Patient Name: Date of Service: [name], [name]. 11/13/2022 11:45 A M Medical Record Number: [id number] Patient Account Number: [id number] Date of Birth/Sex: Treating RN: 04/17/1960 (62 y.o. F) Primary Care [name]: [name] Other Clinician: Referring [name]: Treating [name]/Extender: [name] in Treatment: 42 Visit Information History Since Last Visit All ordered tests and consults were completed: No Patient Arrived: Ambulatory Added or deleted any medications: No Arrival Time: 11:46 Any new allergies or adverse reactions: No Accompanied By: self Had a fall or experienced change in No Transfer Assistance: None activities of daily living that may affect Patient Identification Verified: Yes risk of falls: Secondary Verification Process Completed: Yes Signs or symptoms of abuse/neglect since last visito No Patient Requires Transmission-Based Precautions: No Hospitalized since last visit: No Patient Has Alerts: No Implantable device outside of the clinic excluding No cellular tissue based products placed in the center since last visit: Pain Present Now: No Electronic Signature(s) Signed: 11/13/2022 12:02:07 PM By: [name] Entered By: [name] on 11/13/2022 11:47:07 -------------------------------------------------------------------------------- Encounter Discharge Information Details Patient Name: Date of Service: [name], [name] 11/13/2022 11:45 A M Medical Record Number: [id number] Patient Account Number: [id number] Date of Birth/Sex: Treating RN: 15-May-1960 (62 y.o. [name] Primary Care Camdan Burdi: [name] Other Clinician: Referring [name]: Treating [name]/Extender: [name] in Treatment: 42 Encounter Discharge Information Items Post  Procedure Vitals Discharge Condition: Stable Temperature (F): 98 Ambulatory Status: Ambulatory Pulse (bpm): 72 Discharge Destination: Home Respiratory Rate (breaths/min): 18 Transportation: Private Auto Blood Pressure (mmHg): 151/68 Accompanied By: self Schedule Follow-up Appointment: Yes Clinical Summary of Care: Patient Declined Electronic Signature(s) Signed: 12/03/2022 2:03:55 PM By: [name] Entered By: [name] on 11/13/2022 12:25:40 [name] ([id number]) [id number] Page 2 of 7 -------------------------------------------------------------------------------- Lower Extremity Assessment Details Patient Name: Date of Service: [name] 11/13/2022 11:45 A M Medical Record Number: [id number] Patient Account Number: [id number] Date of Birth/Sex: Treating RN: Sep 08, 1960 (62 y.o. [name] Primary Care Latunya Kissick: [name] Other Clinician: Referring [name]: Treating [name]/Extender: [name] in Treatment: 42 Edema Assessment Assessed: [name]: No] Franne Forts: No] Edema: [Left: Yes] [Right: Yes] Calf Left: Right: Point of Measurement: From Medial Instep 37 cm 39 cm Ankle Left: Right: Point of Measurement: From Medial Instep 22.5 cm 23 cm Vascular Assessment Pulses: Dorsalis Pedis Palpable: [Left:Yes] [Right:Yes] Extremity colors, hair growth, and conditions: Extremity Color: [Left:Hyperpigmented] [Right:Hyperpigmented] Hair Growth on Extremity: [Left:No] [Right:No] Temperature of Extremity: [Left:Warm < 3 seconds] [Right:Warm < 3 seconds] Toe Nail Assessment Left: Right: Thick: No No Discolored: No No Deformed: No No Improper Length and Hygiene: No No Electronic Signature(s) Signed: 12/03/2022 2:03:55 PM By: [name] Entered By: [name] on 11/13/2022 12:01:00 -------------------------------------------------------------------------------- Multi Wound Chart  Details Patient Name: Date of Service: [name], [name] 11/13/2022 11:45 A M Medical Record Number: [id number] Patient Account Number: [id number] Date of Birth/Sex: Treating RN: [birth year] (62 y.o. F) Primary Care [name]: [name] Other Clinician: Referring [name]: Treating [name]/Extender: [name] in Treatment: 42 Vital Signs Height(in): 61 Pulse(bpm): 93 Weight(lbs): 277 Blood Pressure(mmHg): 125/61 Body Mass Index(BMI): 52.3 Temperature(F): 98.1 Respiratory Rate(breaths/min): 18 [name], Teneisha [name] ([id number]) [phone].pdf Page 3 of 7 [3:Photos:] [N/A:N/A] Left, Posterior Lower Leg N/A N/A Wound Location: Trauma N/A N/A Wounding Event: Venous Leg Ulcer N/A N/A Primary Etiology: Anemia, Sleep Apnea, Hypertension,  N/A N/A Comorbid History: Peripheral Venous Disease 06/28/2022 N/A N/A Date Acquired: 14 N/A N/A Weeks of Treatment: Open N/A N/A Wound Status: No N/A N/A Wound Recurrence: 0.9x0.6x0.1 N/A N/A Measurements L x W x D (cm) 0.424 N/A N/A A (cm) : rea 0.042 N/A N/A Volume (cm) : 68.00% N/A N/A % Reduction in A rea: 68.40% N/A N/A % Reduction in Volume: Full Thickness Without Exposed N/A N/A Classification: Support Structures Small N/A N/A Exudate A mount: Serosanguineous N/A N/A Exudate Type: red, brown N/A N/A Exudate Color: Flat and Intact N/A N/A Wound Margin: Large (67-100%) N/A N/A Granulation A mount: Red N/A N/A Granulation Quality: Small (1-33%) N/A N/A Necrotic A mount: Fat Layer (Subcutaneous Tissue): Yes N/A N/A Exposed Structures: Fascia: No Tendon: No Muscle: No Joint: No Bone: No Medium (34-66%) N/A N/A Epithelialization: Debridement - Selective/Open Wound N/A N/A Debridement: Pre-procedure Verification/Time Out 12:08 N/A N/A Taken: Lidocaine 4% Topical Solution N/A N/A Pain Control: Necrotic/Eschar, Slough N/A N/A Tissue Debrided: Non-Viable  Tissue N/A N/A Level: 0.42 N/A N/A Debridement A (sq cm): rea Curette N/A N/A Instrument: Minimum N/A N/A Bleeding: Pressure N/A N/A Hemostasis A chieved: 0 N/A N/A Procedural Pain: 0 N/A N/A Post Procedural Pain: Procedure was tolerated well N/A N/A Debridement Treatment Response: 0.9x0.6x0.1 N/A N/A Post Debridement Measurements L x W x D (cm) 0.042 N/A N/A Post Debridement Volume: (cm) Rash: Yes N/A N/A Periwound Skin Texture: No Abnormalities Noted N/A N/A Periwound Skin Moisture: Erythema: No N/A N/A Periwound Skin Color: No Abnormality N/A N/A Temperature: Yes N/A N/A Tenderness on Palpation: Debridement N/A N/A Procedures Performed: Treatment Notes Electronic Signature(s) Signed: 11/13/2022 12:19:14 PM By: [name] MD FACS Entered By: [name] on 11/13/2022 12:19:13 [name] ([id number]) [id number] Page 4 of 7 -------------------------------------------------------------------------------- Multi-Disciplinary Care Plan Details Patient Name: Date of Service: [place]. 11/13/2022 11:45 A M Medical Record Number: [id number] Patient Account Number: [id number] Date of Birth/Sex: Treating RN: 1961-04-04 (62 y.o. [name] Primary Care Etha Stambaugh: [name] Other Clinician: Referring Ruvi Fullenwider: Treating [place] Ice/Extender: [name] in Treatment: 42 Multidisciplinary Care Plan reviewed with physician Active Inactive Necrotic Tissue Nursing Diagnoses: Impaired tissue integrity related to necrotic/devitalized tissue Knowledge deficit related to management of necrotic/devitalized tissue Goals: Necrotic/devitalized tissue will be minimized in the wound bed Date Initiated: 01/23/2022 Target Resolution Date: 12/07/2022 Goal Status: Active Patient/caregiver will verbalize understanding of reason and process for debridement of necrotic tissue Date Initiated:  01/23/2022 Target Resolution Date: 12/07/2022 Goal Status: Active Interventions: Assess patient pain level pre-, during and post procedure and prior to discharge Provide education on necrotic tissue and debridement process Treatment Activities: Apply topical anesthetic as ordered : 01/23/2022 Notes: Venous Leg Ulcer Nursing Diagnoses: Knowledge deficit related to disease process and management Potential for venous Insuffiency (use before diagnosis confirmed) Goals: Patient will maintain optimal edema control Date Initiated: 07/05/2022 Target Resolution Date: 12/07/2022 Goal Status: Active Interventions: Assess peripheral edema status every visit. Compression as ordered Provide education on venous insufficiency Treatment Activities: Therapeutic compression applied : 07/05/2022 Notes: Wound/Skin Impairment Nursing Diagnoses: Impaired tissue integrity Knowledge deficit related to ulceration/compromised skin integrity Goals: Patient/caregiver will verbalize understanding of skin care regimen Date Initiated: 01/23/2022 Target Resolution Date: 12/07/2022 Goal Status: Active Interventions: Assess patient/caregiver ability to obtain necessary supplies Assess ulceration(s) every visit Treatment Activities: Skin care regimen initiated : 01/23/2022 Topical wound management initiated : 01/23/2022 ZELINDA, GARTIN ([id number]) [phone].pdf Page 5 of 7 Notes: Electronic Signature(s) Signed: 12/03/2022 2:03:55 PM  By: Isabelle Course By: [name] on 11/13/2022 12:05:55 -------------------------------------------------------------------------------- Pain Assessment Details Patient Name: Date of Service: [place], [name] 11/13/2022 11:45 A M Medical Record Number: [id number] Patient Account Number: [id number] Date of Birth/Sex: Treating RN: 1960/07/17 (62 y.o. F) Primary Care Zoeya Gramajo: [name] Other Clinician: Referring Naethan Bracewell: Treating  Que Meneely/Extender: [name] in Treatment: 42 Active Problems Location of Pain Severity and Description of Pain Patient Has Paino No Site Locations Pain Management and Medication Current Pain Management: Electronic Signature(s) Signed: 11/13/2022 12:02:07 PM By: [name] Entered By: [name] on 11/13/2022 11:47:32 -------------------------------------------------------------------------------- Patient/Caregiver Education Details Patient Name: Date of Service: [name], [name] [name] 8/7/2024andnbsp11:45 A M Medical Record Number: [id number] Patient Account Number: [id number] Date of Birth/Gender: Treating RN: 05-19-1960 (62 y.o. [name] Primary Care Physician: [name] Other Clinician: Referring Physician: Treating Physician/Extender: [name] in Treatment: [address] LAUNDYN, SCARLATA ([id number]) [id number] Page 6 of 7 Education Provided To: Patient Education Topics Provided Wound/Skin Impairment: Methods: Explain/Verbal Responses: State content correctly Electronic Signature(s) Signed: 12/03/2022 2:03:55 PM By: [name] Entered By: [name] on 11/13/2022 12:06:16 -------------------------------------------------------------------------------- Wound Assessment Details Patient Name: Date of Service: [name], [name] 11/13/2022 11:45 A M Medical Record Number: [id number] Patient Account Number: [id number] Date of Birth/Sex: Treating RN: 08-24-1960 (62 y.o. F) Primary Care Margarita Croke: [name] Other Clinician: Referring Andras Grunewald: Treating Skyelar Halliday/Extender: [name] in Treatment: 42 Wound Status Wound Number: 3 Primary Venous Leg Ulcer Etiology: Wound Location: Left, Posterior Lower Leg Wound Status: Open Wounding Event: Trauma Comorbid Anemia, Sleep Apnea, Hypertension, Peripheral Venous Date  Acquired: 06/28/2022 History: Disease Weeks Of Treatment: 14 Clustered Wound: No Photos Wound Measurements Length: (cm) 0.9 Width: (cm) 0.6 Depth: (cm) 0.1 Area: (cm) 0.424 Volume: (cm) 0.042 % Reduction in Area: 68% % Reduction in Volume: 68.4% Epithelialization: Medium (34-66%) Wound Description Classification: Full Thickness Without Exposed Suppor Wound Margin: Flat and Intact Exudate Amount: Small Exudate Type: Serosanguineous Exudate Color: red, brown t Structures Foul Odor After Cleansing: No Slough/Fibrino Yes Wound Bed Granulation Amount: Large (67-100%) Exposed Structure Granulation Quality: Red Fascia Exposed: No Necrotic Amount: Small (1-33%) Fat Layer (Subcutaneous Tissue) Exposed: Yes Necrotic Quality: Adherent Slough Tendon Exposed: No Muscle Exposed: No Joint Exposed: No VERNITA, MEINECKE ([id number]) [phone].pdf Page 7 of 7 Bone Exposed: No Periwound Skin Texture Texture Color No Abnormalities Noted: Yes No Abnormalities Noted: Yes Moisture Temperature / Pain No Abnormalities Noted: Yes Temperature: No Abnormality Tenderness on Palpation: Yes Electronic Signature(s) Signed: 11/13/2022 12:02:07 PM By: [name] Entered By: [name] on 11/13/2022 11:50:58 -------------------------------------------------------------------------------- Vitals Details Patient Name: Date of Service: [name], [name] 11/13/2022 11:45 A M Medical Record Number: [id number] Patient Account Number: [id number] Date of Birth/Sex: Treating RN: [birth year] (62 y.o. F) Primary Care Deepti Gunawan: [name] Other Clinician: Referring Gianmarco Roye: Treating Hernan Turnage/Extender: [name] in Treatment: 42 Vital Signs Time Taken: 11:47 Temperature (F): 98.1 Height (in): 61 Pulse (bpm): 93 Weight (lbs): 277 Respiratory Rate (breaths/min): 18 Body Mass Index (BMI): 52.3 Blood Pressure (mmHg): 125/61 Reference Range: 80 -  120 mg / dl Electronic Signature(s) Signed: 11/13/2022 12:02:07 PM By: [name] Entered By: [name] on 11/13/2022 11:47:26

## 2022-12-03 NOTE — Progress Notes (Signed)
[name], [name] ([id number]) [id number] Page 1 of 12 Visit Report for 11/27/2022 Chief Complaint Document Details Patient Name: Date of Service: [name] 11/27/2022 4:00 PM Medical Record Number: [id number] Patient Account Number: [id number] Date of Birth/Sex: Treating RN: [name]-[birth year] (62 y.o. F) Primary Care Provider: Nira Conn Other Clinician: Referring Provider: Treating Provider/Extender: Andrey Campanile in Treatment: 16 Information Obtained from: Patient Chief Complaint RLE ulcer Electronic Signature(s) Signed: 11/27/2022 4:21:52 PM By: [name] MD FACS Entered By: [name] on 11/27/2022 16:21:51 -------------------------------------------------------------------------------- Debridement Details Patient Name: Date of Service: [name], [name] 11/27/2022 4:00 PM Medical Record Number: [id number] Patient Account Number: [id number] Date of Birth/Sex: Treating RN: [birth year], [name] (62 y.o. [name] Primary Care Provider: Nira Conn Other Clinician: Referring Provider: Treating Provider/Extender: Andrey Campanile in Treatment: 44 Debridement Performed for Assessment: Wound #3 Left,Posterior Lower Leg Performed By: Physician [name], MD Debridement Type: Debridement Severity of Tissue Pre Debridement: Fat layer exposed Level of Consciousness (Pre-procedure): Awake and Alert Pre-procedure Verification/Time Out Yes - 16:19 Taken: Pain Control: Lidocaine 4% Topical Solution Percent of Wound Bed Debrided: 100% T Area Debrided (cm): otal 0.07 Tissue and other material debrided: Slough, Slough Level: Non-Viable Tissue Debridement Description: Selective/Open Wound Instrument: Curette Bleeding: Minimum Hemostasis Achieved: Pressure Response to Treatment: Procedure was tolerated well Level of Consciousness (Post- Awake and Alert procedure): Post  Debridement Measurements of Total Wound Length: (cm) 0.3 Width: (cm) 0.3 Depth: (cm) 0.1 Volume: (cm) 0.007 Character of Wound/Ulcer Post Debridement: Improved Severity of Tissue Post Debridement: Fat layer exposed Post Procedure Diagnosis Same as [name], [name] ([id number]) [id number] Page 2 of 12 Notes Scribed for Dr Lady Gary by Brenton Grills RN Electronic Signature(s) Signed: 11/27/2022 4:25:43 PM By: [name] MD FACS Signed: 12/03/2022 2:02:59 PM By: Brenton Grills Entered By: Brenton Grills on 11/27/2022 16:19:18 -------------------------------------------------------------------------------- HPI Details Patient Name: Date of Service: [name], [name] 11/27/2022 4:00 PM Medical Record Number: [id number] Patient Account Number: [id number] Date of Birth/Sex: Treating RN: [name]-[birth year] (62 y.o. F) Primary Care Provider: Nira Conn Other Clinician: Referring Provider: Treating Provider/Extender: Andrey Campanile in Treatment: 31 History of Present Illness HPI Description: 02/16/2020 upon evaluation today patient actually appears to be doing poorly in regard to her left medial lower extremity ulcer. This is actually an area that she tells me she has had intermittent issues with over the years although has been closed for some time she typically uses compression right now she has juxta lite compression wraps. With that being said she tells me that this nonetheless open several weeks/months ago and has been given her trouble since. She does have a history of chronic venous insufficiency she is seeing specialist for this in the past she has had an ablation as well as sclerotherapy. With that being said she also has hypertension chronically which is managed by her primary care provider. In general she seems to be worsening overall with regard to the wound and states that she finally realized that she needed to  come in and have somebody look at this and not continue to try to manage this on her own. No fevers, chills, nausea, vomiting, or diarrhea. 02/23/2020 on evaluation today patient appears to be doing well with regard to her wound. This is showing some signs of improvement which is great news still were not quite at the point where I would like to be as far as the overall appearance of the wound  is concerned but I do believe this is better than last week. I do believe the Iodoflex is helping as well. 03/08/2020 upon evaluation today patient appears to be doing well with regard to her wound. She has been tolerating the dressing changes without complication. Fortunately I feel like she has made great progress with the Iodoflex but I feel like it may be the point rest to switch to something else possibly a collagen- based dressing at this time. 03/15/2020 upon evaluation today patient appears to be doing excellent in regard to her leg ulcer. She has been tolerating the dressing changes without complication. Fortunately there is no signs of active infection. Overall she is measuring a little bit smaller today which is great news. 03/22/2020 upon evaluation today patient appears to be doing well with regard to her wound. She has been tolerating the dressing changes without complication. Fortunately there is no signs of active infection at this time. 03/28/2020; patient I do not usually see however she has a wound on the left anterior lower leg secondary to chronic venous insufficiency we have been using silver collagen under compression. She arrives in clinic with a nonviable surface requiring debridement 04/12/2020 upon evaluation today patient appears to be doing well all things considered with regard to her leg ulcer. She is tolerating the dressing changes without complication there is minimal dry skin around the edges of the wound that may be trapping and stopping some of the events of the new skin I am can  work on that today. Otherwise the surface of the wound appears to be doing excellent. 04/19/2020 upon evaluation today patient appears to be doing well with regard to her leg ulcer. She has been tolerating dressing changes without complication. Fortunately there is no signs of active infection at this time. No fever chills noted overall very pleased with how things seem to be progressing. 04/26/2020 on evaluation today patient appears to be doing well with regard to her wound currently. Is showing signs of excellent improvement overall is filling in nicely and there does not appear to be any signs of infection. No fevers, chills, nausea, vomiting, or diarrhea. 05/03/2020 upon evaluation today patient appears to be doing well with regard to her wound on the leg. This overall showing signs of good improvement which is great she has some good epithelial growth and overall I think that things [name] moving in the correct direction. We likewise going to continue with the wound care measures as before since she seems to making such good improvement. 2/3; venous wound on the left medial leg. This is contracting. We [name] using Prisma and 3 layer compression. She has a stocking and waiting in the eventuality this heals. She is already using it on the right 05/17/2020 upon evaluation today patient appears to be doing well with regard to her leg ulcer. She has been tolerating the dressing changes without complication. Fortunately there is no signs of active infection which is great news and overall very pleased with where things stand today. No fevers, chills, nausea, vomiting, or diarrhea. 05/24/2020 upon evaluation today patient appears to be doing well with regard to her wound. Overall I feel like she is making excellent progress. There does not appear to be any signs of active infection which is great news. 05/31/2020 upon evaluation today patient appears to be doing well with regard to her wound. There does not  appear to be any signs of active infection which is great news overall I am extremely pleased with  where things stand today. READMISSION 01/23/2022 She returns to clinic today with a new wound on her right posterior calf. She says that she was cleaning out an old shed near the middle of August this year and then noticed what seemed to be a bug bite on her right posterior calf. It was itchy, red, and raised. By the end of September, an ulcer had developed. She has been applying various topical creams such as hydrocortisone and others to the site. When it was not improving, she made an appointment in the wound care TEAH, ARMENT ([id number]) [id number] Page 3 of 12 center. ABI in clinic today was 0.97. On her right posterior calf, there is a circular wound with necrotic fat and black eschar present. There is no purulent drainage or malodor. The periwound skin is in good condition with just a little induration that appears to be secondary to inflammation. 01/31/2022: The wound measures a little bit larger today, but overall is quite a bit cleaner. There is some undermining from 9:00 to 3:00. She is having some periwound itching and says that her wrap slid. 02/08/2022: Despite using an Unna boot first layer at the top of her wrap, it slid again and it looks like there is been some bruising at the wound site. The wound is about the same size in terms of dimensions. There is a fair amount of slough and nonviable tissue still present. Edema control is better than last week. 02/15/2022: The wound dimensions [name] about the same. There is less nonviable tissue present. The periwound erythema has improved. 02/22/2022: The wound has deteriorated over the past week. It is larger and the periwound is more edematous, erythematous, and indurated. It looks as though there has been more tissue breakdown with undermining present. She is having more pain. There is a foul odor coming from  the wound. 02/26/2022: The wound looks quite a bit better today and the odor is gone. I still do not have her culture data back, but she seems to be responding well to the Augmentin. 03/06/2022: Her wound continues to improve. There is still some slough accumulation, but the periwound skin is less inflamed. Her culture returned with a polymicrobial population including Pseudomonas and so levofloxacin was also prescribed. She did not understand why a second antibiotic was being added so she has not yet initiated this. 03/14/2022: The wound is about the same, to perhaps a little bit larger. There is a fair amount of slough accumulation on the surface, as well as some hypertrophic granulation tissue. She is still taking levofloxacin, but has completed taking Augmentin. She has her Jodie Echevaria compound with her today. 12/14; the patient has a significant circular wound on the posterior right calf. She is using Keystone and silver alginate under 3 layer compression. Her ABIs were within normal limits at 0.97. This may have been traumatic or an insect bite at the start I reviewed these records. 04/04/2022: Since I last saw the wound, it has contracted considerably. There is a fairly thick layer of slough on the surface, as it has not been debrided since the last time I did it. Edema control is excellent and the periwound skin is in much better condition. 04/12/2022: No significant change in the wound dimensions. It is filling with granulation tissue. Still with slough accumulation on the surface. 04/19/2022: The wound is smaller this week. There is some slough accumulation on the surface. 04/26/2022: The wound is smaller again this week and significantly cleaner. The wound surface is a  little bit drier than ideal. 05/03/2022: The wound measurements were about the same, but visually it appears smaller. There is still some undermining at the top of the wound. Moisture balance is better this week. 05/10/2022: The wound  measured smaller today. There is still a fair amount of undermining present. Slough has built up on the surface. We [name] still awaiting snap VAC approval. 05/17/2022: The wound is a little bit smaller today. There is less slough on the surface, but the granulation tissue still is not very robust. She has been approved for snap VAC but will have a 20% coinsurance and it is not clear what that amount would end up being for her. 05/27/2022: The surface of the wound has deteriorated and it is gray and fibrotic. No significant odor, but the drainage on her dressing was a little bit purulent. 05/31/2022: The wound looks quite a bit better today. It is still a little fibrotic but no longer has purulent-looking drainage. The color is better. She spoke with her insurance company and is interested in trying the snap VAC, now that she is aware of the cost to her. 06/07/2022: After 1 week in the snap VAC, there has been substantial improvement to her wound. The undermined portion is closing in. The surface has a healthier color and appearance. There is very minimal slough accumulation. 06/14/2022: The more shallow part of the undermined portion of her wound has closed. There is still some undermining from about 1-2 o'clock. The surface continues to improve. Minimal slough accumulation. 06/28/2022: The wound is shallower this week and the undermining has essentially closed and completely. The surface tissue is improving. 07/05/2022: The depth is almost immeasurable and the surface is nearly flush with the surrounding skin. The undermining has been eliminated. There is light slough on the wound surface. 07/12/2022: There is a little bit of slough on the wound surface. The depth is about the same as last week. 07/19/2022: The wound is essentially flush with the surrounding skin surface. There is slight slough present. No real change in the AP or transverse dimensions. 07/26/2022: The wound measured a little bit smaller today. It  is flush with the surrounding skin surface and there is good granulation tissue present. Minimal slough and biofilm accumulation. 08/02/2022: The right posterior leg wound is a little bit smaller. There is a little slough accumulation. She reported a new wound to [place] today. It is on her left posterior calf. It has been present for about 6 weeks. She is not sure of how it occurred. She has been trying to manage it at home with topical Neosporin and Band-Aids. The fat layer is exposed. The surface is fibrotic and covered with slough. 08/09/2022: The right posterior leg wound is smaller again this week. There is good granulation tissue on the surface with minimal slough accumulation. The left posterior calf wound has built up a thick layer of slough. It is still quite fibrotic underneath the slough, but there is a little bit of a pink color beginning to emerge. Edema control is good. 08/16/2022: Both wounds [name] smaller this week. There is minimal slough on the right posterior leg wound. There is slough that has reaccumulated on the left posterior calf wound but there is a little bit more granulation tissue emerging. 08/23/2022: Both wounds [name] about the same size this week, but the quality of the tissue and amount of granulation tissue on the left [name] both better. 08/30/2022: Both wounds measure smaller today, but there has been some tissue  breakdown adjacent to the left leg wound. She says it has been itchy. There is a little bit of slough on the surface of both wounds. 09/06/2022: The right wound measures smaller today. There is minimal slough on the wound surface. The left wound is about the same size. The periwound tissue looks better this week. There is still a layer of fibrinous slough on the left wound. Edema control is good bilaterally. 6/7 the patient has wounds on bilateral calfs. We [name] using silver alginate on the right Iodoflex on the left under Urgo lite compression. The wounds [name] measuring  smaller. 09/20/2022: The right wound is flush with the surrounding skin. There is good granulation tissue with some biofilm and thin eschar present. On the left, there is some slough accumulation and the patient says it has been a little itchy this week. [name], [name] ([id number]) [id number] Page 4 of 12 09/27/2022: No significant change to the right wound. The left wound measures a little smaller, but still has some depth to it with slough accumulation. 10/04/2022: Nice improvement in both wounds. They [name] smaller and more superficial. The left wound has better granulation tissue and no longer has the hard fibrous surface. 10/11/2022: Both wounds continue to contract. There is minimal slough and eschar present. Granulation tissue continues to fill in on the left. Edema control is good. 10/21/2022: The right leg wound is about half the size as it was last week. There is a little perimeter eschar and some light slough on the surface. The left leg wound is about the same size, but shallower. There is still slough accumulation but the buds of granulation tissue [name] getting larger. Edema control remains excellent. 10/30/2022: The right leg wound is healed. The left leg wound is smaller with better granulation tissue and only a little bit of slough present. Edema control is excellent. 11/07/2022: The left leg wound continues to contract and the surface continues to improve. Minimal slough with good granulation tissue filling in. 11/13/2022: Most of the left leg wound has epithelialized. There is a small open area remaining that is about a millimeter in each dimension. There is slough accumulation present. 11/20/2022: There is a little bit of slough on the wound surface. It is about the same size as last week. Edema control is good. 11/27/2022: A tiny opening remains with slough on the surface. Edema control remains excellent. Electronic Signature(s) Signed: 11/27/2022 4:22:29 PM By:  [name] MD FACS Entered By: [name] on 11/27/2022 16:22:29 -------------------------------------------------------------------------------- Physical Exam Details Patient Name: Date of Service: [name], [name] 11/27/2022 4:00 PM Medical Record Number: [id number] Patient Account Number: [id number] Date of Birth/Sex: Treating RN: [name][birth year] (62 y.o. F) Primary Care Provider: Nira Conn Other Clinician: Referring Provider: Treating Provider/Extender: Andrey Campanile in Treatment: 13 Constitutional Hypotensive, but normal for this patient. . . . no acute distress. Respiratory Normal work of breathing on room air. Notes 11/27/2022: A tiny opening remains with slough on the surface. Edema control remains excellent. Electronic Signature(s) Signed: 11/27/2022 4:23:04 PM By: [name] MD FACS Entered By: [name] on 11/27/2022 16:23:04 -------------------------------------------------------------------------------- Physician Orders Details Patient Name: Date of Service: [address], [name] 11/27/2022 4:00 PM Medical Record Number: [id number] Patient Account Number: [id number] Date of Birth/Sex: Treating RN: [name]-[birth year] (62 y.o. [name] Primary Care Provider: Nira Conn Other Clinician: Referring Provider: Treating Provider/Extender: Andrey Campanile in Treatment: 67 Verbal / Phone Orders: No [name], [name] ([id number]) [id number] Page  5 of 12 Diagnosis Coding ICD-10 Coding Code Description L97.222 Non-pressure chronic ulcer of left calf with fat layer exposed E66.01 Morbid (severe) obesity due to excess calories I10 Essential (primary) hypertension R60.0 Localized edema Follow-up Appointments Return Appointment in 1 week. Anesthetic (In clinic) Topical Lidocaine 4% applied to wound bed Bathing/ Shower/ Hygiene May shower with protection but do not  get wound dressing(s) wet. Protect dressing(s) with water repellant cover (for example, large plastic bag) or a cast cover and may then take shower. Wound Treatment Wound #3 - Lower Leg Wound Laterality: Left, Posterior Topical: Gentamicin Discharge Instructions: As directed by physician Topical: Mupirocin Ointment Discharge Instructions: Apply Mupirocin (Bactroban) as instructed Prim Dressing: Promogran Prisma Matrix, 4.34 (sq in) (silver collagen) ary Discharge Instructions: Moisten collagen with saline or hydrogel Secondary Dressing: Woven Gauze Sponges 2x2 in Discharge Instructions: Apply over primary dressing as directed. Compression Wrap: Urgo K2 Lite, (equivalent to a 3 layer) two layer compression system, regular Discharge Instructions: Apply Urgo K2 Lite as directed (alternative to 3 layer compression). Electronic Signature(s) Signed: 11/27/2022 4:24:11 PM By: [name] MD FACS Entered By: [name] on 11/27/2022 16:24:10 -------------------------------------------------------------------------------- Problem List Details Patient Name: Date of Service: [address], [name] 11/27/2022 4:00 PM Medical Record Number: [id number] Patient Account Number: [id number] Date of Birth/Sex: Treating RN: [name], [name] (62 y.o. [name] Primary Care Provider: Nira Conn Other Clinician: Referring Provider: Treating Provider/Extender: Andrey Campanile in Treatment: 1 Active Problems ICD-10 Encounter Code Description Active Date MDM Diagnosis [phone] Non-pressure chronic ulcer of left calf with fat layer exposed 08/02/2022 No Yes E66.01 Morbid (severe) obesity due to excess calories 01/23/2022 No Yes I10 Essential (primary) hypertension 01/23/2022 No Yes [name], [name] ([id number]) [id number] Page 6 of 12 R60.0 Localized edema 01/23/2022 No Yes Inactive Problems Resolved Problems ICD-10 Code Description  Active Date Resolved Date L97.212 Non-pressure chronic ulcer of right calf with fat layer exposed 01/23/2022 01/23/2022 Electronic Signature(s) Signed: 11/27/2022 4:21:22 PM By: [name] MD FACS Entered By: [name] on 11/27/2022 16:21:22 -------------------------------------------------------------------------------- Progress Note Details Patient Name: Date of Service: [name], [name] 11/27/2022 4:00 PM Medical Record Number: [id number] Patient Account Number: [id number] Date of Birth/Sex: Treating RN: [birth year], [name] (62 y.o. F) Primary Care Provider: Nira Conn Other Clinician: Referring Provider: Treating Provider/Extender: Andrey Campanile in Treatment: 77 Subjective Chief Complaint Information obtained from Patient RLE ulcer History of Present Illness (HPI) 02/16/2020 upon evaluation today patient actually appears to be doing poorly in regard to her left medial lower extremity ulcer. This is actually an area that she tells me she has had intermittent issues with over the years although has been closed for some time she typically uses compression right now she has juxta lite compression wraps. With that being said she tells me that this nonetheless open several weeks/months ago and has been given her trouble since. She does have a history of chronic venous insufficiency she is seeing specialist for this in the past she has had an ablation as well as sclerotherapy. With that being said she also has hypertension chronically which is managed by her primary care provider. In general she seems to be worsening overall with regard to the wound and states that she finally realized that she needed to come in and have somebody look at this and not continue to try to manage this on her own. No fevers, chills, nausea, vomiting, or diarrhea. 02/23/2020 on evaluation today patient appears to be doing well with  regard to her wound. This is showing some  signs of improvement which is great news still were not quite at the point where I would like to be as far as the overall appearance of the wound is concerned but I do believe this is better than last week. I do believe the Iodoflex is helping as well. 03/08/2020 upon evaluation today patient appears to be doing well with regard to her wound. She has been tolerating the dressing changes without complication. Fortunately I feel like she has made great progress with the Iodoflex but I feel like it may be the point rest to switch to something else possibly a collagen- based dressing at this time. 03/15/2020 upon evaluation today patient appears to be doing excellent in regard to her leg ulcer. She has been tolerating the dressing changes without complication. Fortunately there is no signs of active infection. Overall she is measuring a little bit smaller today which is great news. 03/22/2020 upon evaluation today patient appears to be doing well with regard to her wound. She has been tolerating the dressing changes without complication. Fortunately there is no signs of active infection at this time. 03/28/2020; patient I do not usually see however she has a wound on the left anterior lower leg secondary to chronic venous insufficiency we have been using silver collagen under compression. She arrives in clinic with a nonviable surface requiring debridement 04/12/2020 upon evaluation today patient appears to be doing well all things considered with regard to her leg ulcer. She is tolerating the dressing changes without complication there is minimal dry skin around the edges of the wound that may be trapping and stopping some of the events of the new skin I am can work on that today. Otherwise the surface of the wound appears to be doing excellent. 04/19/2020 upon evaluation today patient appears to be doing well with regard to her leg ulcer. She has been tolerating dressing changes without  complication. Fortunately there is no signs of active infection at this time. No fever chills noted overall very pleased with how things seem to be progressing. 04/26/2020 on evaluation today patient appears to be doing well with regard to her wound currently. Is showing signs of excellent improvement overall is filling in nicely and there does not appear to be any signs of infection. No fevers, chills, nausea, vomiting, or diarrhea. 05/03/2020 upon evaluation today patient appears to be doing well with regard to her wound on the leg. This overall showing signs of good improvement which is [name], [name] ([id number]) [id number] Page 7 of 12 great she has some good epithelial growth and overall I think that things [name] moving in the correct direction. We likewise going to continue with the wound care measures as before since she seems to making such good improvement. 2/3; venous wound on the left medial leg. This is contracting. We [name] using Prisma and 3 layer compression. She has a stocking and waiting in the eventuality this heals. She is already using it on the right 05/17/2020 upon evaluation today patient appears to be doing well with regard to her leg ulcer. She has been tolerating the dressing changes without complication. Fortunately there is no signs of active infection which is great news and overall very pleased with where things stand today. No fevers, chills, nausea, vomiting, or diarrhea. 05/24/2020 upon evaluation today patient appears to be doing well with regard to her wound. Overall I feel like she is making excellent progress. There does not appear to  be any signs of active infection which is great news. 05/31/2020 upon evaluation today patient appears to be doing well with regard to her wound. There does not appear to be any signs of active infection which is great news overall I am extremely pleased with where things stand  today. READMISSION 01/23/2022 She returns to clinic today with a new wound on her right posterior calf. She says that she was cleaning out an old shed near the middle of August this year and then noticed what seemed to be a bug bite on her right posterior calf. It was itchy, red, and raised. By the end of September, an ulcer had developed. She has been applying various topical creams such as hydrocortisone and others to the site. When it was not improving, she made an appointment in the wound care center. ABI in clinic today was 0.97. On her right posterior calf, there is a circular wound with necrotic fat and black eschar present. There is no purulent drainage or malodor. The periwound skin is in good condition with just a little induration that appears to be secondary to inflammation. 01/31/2022: The wound measures a little bit larger today, but overall is quite a bit cleaner. There is some undermining from 9:00 to 3:00. She is having some periwound itching and says that her wrap slid. 02/08/2022: Despite using an Unna boot first layer at the top of her wrap, it slid again and it looks like there is been some bruising at the wound site. The wound is about the same size in terms of dimensions. There is a fair amount of slough and nonviable tissue still present. Edema control is better than last week. 02/15/2022: The wound dimensions [name] about the same. There is less nonviable tissue present. The periwound erythema has improved. 02/22/2022: The wound has deteriorated over the past week. It is larger and the periwound is more edematous, erythematous, and indurated. It looks as though there has been more tissue breakdown with undermining present. She is having more pain. There is a foul odor coming from the wound. 02/26/2022: The wound looks quite a bit better today and the odor is gone. I still do not have her culture data back, but she seems to be responding well to the Augmentin. 03/06/2022: Her  wound continues to improve. There is still some slough accumulation, but the periwound skin is less inflamed. Her culture returned with a polymicrobial population including Pseudomonas and so levofloxacin was also prescribed. She did not understand why a second antibiotic was being added so she has not yet initiated this. 03/14/2022: The wound is about the same, to perhaps a little bit larger. There is a fair amount of slough accumulation on the surface, as well as some hypertrophic granulation tissue. She is still taking levofloxacin, but has completed taking Augmentin. She has her Jodie Echevaria compound with her today. 12/14; the patient has a significant circular wound on the posterior right calf. She is using Keystone and silver alginate under 3 layer compression. Her ABIs were within normal limits at 0.97. This may have been traumatic or an insect bite at the start I reviewed these records. 04/04/2022: Since I last saw the wound, it has contracted considerably. There is a fairly thick layer of slough on the surface, as it has not been debrided since the last time I did it. Edema control is excellent and the periwound skin is in much better condition. 04/12/2022: No significant change in the wound dimensions. It is filling with granulation tissue.  Still with slough accumulation on the surface. 04/19/2022: The wound is smaller this week. There is some slough accumulation on the surface. 04/26/2022: The wound is smaller again this week and significantly cleaner. The wound surface is a little bit drier than ideal. 05/03/2022: The wound measurements were about the same, but visually it appears smaller. There is still some undermining at the top of the wound. Moisture balance is better this week. 05/10/2022: The wound measured smaller today. There is still a fair amount of undermining present. Slough has built up on the surface. We [name] still awaiting snap VAC approval. 05/17/2022: The wound is a little bit smaller  today. There is less slough on the surface, but the granulation tissue still is not very robust. She has been approved for snap VAC but will have a 20% coinsurance and it is not clear what that amount would end up being for her. 05/27/2022: The surface of the wound has deteriorated and it is gray and fibrotic. No significant odor, but the drainage on her dressing was a little bit purulent. 05/31/2022: The wound looks quite a bit better today. It is still a little fibrotic but no longer has purulent-looking drainage. The color is better. She spoke with her insurance company and is interested in trying the snap VAC, now that she is aware of the cost to her. 06/07/2022: After 1 week in the snap VAC, there has been substantial improvement to her wound. The undermined portion is closing in. The surface has a healthier color and appearance. There is very minimal slough accumulation. 06/14/2022: The more shallow part of the undermined portion of her wound has closed. There is still some undermining from about 1-2 o'clock. The surface continues to improve. Minimal slough accumulation. 06/28/2022: The wound is shallower this week and the undermining has essentially closed and completely. The surface tissue is improving. 07/05/2022: The depth is almost immeasurable and the surface is nearly flush with the surrounding skin. The undermining has been eliminated. There is light slough on the wound surface. 07/12/2022: There is a little bit of slough on the wound surface. The depth is about the same as last week. 07/19/2022: The wound is essentially flush with the surrounding skin surface. There is slight slough present. No real change in the AP or transverse dimensions. 07/26/2022: The wound measured a little bit smaller today. It is flush with the surrounding skin surface and there is good granulation tissue present. Minimal slough and biofilm accumulation. 08/02/2022: The right posterior leg wound is a little bit smaller.  There is a little slough accumulation. She reported a new wound to [place] today. It is on her left posterior calf. It has been present for about 6 weeks. She is not sure of how it occurred. She has been trying to manage it at home with topical Neosporin and Band-Aids. The fat layer is exposed. The surface is fibrotic and covered with slough. [name], [name] ([id number]) [id number] Page 8 of 12 08/09/2022: The right posterior leg wound is smaller again this week. There is good granulation tissue on the surface with minimal slough accumulation. The left posterior calf wound has built up a thick layer of slough. It is still quite fibrotic underneath the slough, but there is a little bit of a pink color beginning to emerge. Edema control is good. 08/16/2022: Both wounds [name] smaller this week. There is minimal slough on the right posterior leg wound. There is slough that has reaccumulated on the left posterior calf wound but there  is a little bit more granulation tissue emerging. 08/23/2022: Both wounds [name] about the same size this week, but the quality of the tissue and amount of granulation tissue on the left [name] both better. 08/30/2022: Both wounds measure smaller today, but there has been some tissue breakdown adjacent to the left leg wound. She says it has been itchy. There is a little bit of slough on the surface of both wounds. 09/06/2022: The right wound measures smaller today. There is minimal slough on the wound surface. The left wound is about the same size. The periwound tissue looks better this week. There is still a layer of fibrinous slough on the left wound. Edema control is good bilaterally. 6/7 the patient has wounds on bilateral calfs. We [name] using silver alginate on the right Iodoflex on the left under Urgo lite compression. The wounds [name] measuring smaller. 09/20/2022: The right wound is flush with the surrounding skin. There is good granulation tissue with some  biofilm and thin eschar present. On the left, there is some slough accumulation and the patient says it has been a little itchy this week. 09/27/2022: No significant change to the right wound. The left wound measures a little smaller, but still has some depth to it with slough accumulation. 10/04/2022: Nice improvement in both wounds. They [name] smaller and more superficial. The left wound has better granulation tissue and no longer has the hard fibrous surface. 10/11/2022: Both wounds continue to contract. There is minimal slough and eschar present. Granulation tissue continues to fill in on the left. Edema control is good. 10/21/2022: The right leg wound is about half the size as it was last week. There is a little perimeter eschar and some light slough on the surface. The left leg wound is about the same size, but shallower. There is still slough accumulation but the buds of granulation tissue [name] getting larger. Edema control remains excellent. 10/30/2022: The right leg wound is healed. The left leg wound is smaller with better granulation tissue and only a little bit of slough present. Edema control is excellent. 11/07/2022: The left leg wound continues to contract and the surface continues to improve. Minimal slough with good granulation tissue filling in. 11/13/2022: Most of the left leg wound has epithelialized. There is a small open area remaining that is about a millimeter in each dimension. There is slough accumulation present. 11/20/2022: There is a little bit of slough on the wound surface. It is about the same size as last week. Edema control is good. 11/27/2022: A tiny opening remains with slough on the surface. Edema control remains excellent. Patient History Information obtained from Patient. Family History Cancer - Mother, Diabetes - Father, Hypertension - Mother, Kidney Disease - Mother, Lung Disease - Father, Thyroid Problems - Paternal Grandparents, No family history of Heart Disease,  Seizures, Stroke, Tuberculosis. Social History Never smoker, Marital Status - Married, Alcohol Use - Never, Drug Use - No History, Caffeine Use - Daily - coffee. Medical History Eyes Denies history of Cataracts, Glaucoma, Optic Neuritis Ear/Nose/Mouth/Throat Denies history of Chronic sinus problems/congestion, Middle ear problems Hematologic/Lymphatic Patient has history of Anemia - iron Denies history of Hemophilia, Human Immunodeficiency Virus, Lymphedema, Sickle Cell Disease Respiratory Patient has history of Sleep Apnea - CPAP Denies history of Aspiration, Asthma, Chronic Obstructive Pulmonary Disease (COPD), Pneumothorax, Tuberculosis Cardiovascular Patient has history of Hypertension, Peripheral Venous Disease Denies history of Angina, Arrhythmia, Congestive Heart Failure, Coronary Artery Disease, Deep Vein Thrombosis, Hypotension, Myocardial Infarction, Peripheral Arterial Disease, Phlebitis, Vasculitis  Gastrointestinal Denies history of Cirrhosis , Colitis, Crohns, Hepatitis A, Hepatitis B, Hepatitis C Endocrine Denies history of Type I Diabetes, Type II Diabetes Genitourinary Denies history of End Stage Renal Disease Immunological Denies history of Lupus Erythematosus, Raynauds, Scleroderma Integumentary (Skin) Denies history of History of Burn Musculoskeletal Denies history of Gout, Rheumatoid Arthritis, Osteoarthritis, Osteomyelitis Neurologic Denies history of Dementia, Neuropathy, Quadriplegia, Paraplegia, Seizure Disorder Oncologic Denies history of Received Chemotherapy, Received Radiation Psychiatric Denies history of Anorexia/bulimia, Confinement Anxiety Hospitalization/Surgery History - cholecystectomy 1980s. - nephrolithasis 1980s. - plate and rod in right elbow surgery 2010. [name], [name] ([id number]) [id number] Page 9 of 12 Medical A Surgical History Notes nd Genitourinary years ago kidney stones Oncologic skin Ca removed  from back years ago Objective Constitutional Hypotensive, but normal for this patient. no acute distress. Vitals Time Taken: 4:00 PM, Height: 61 in, Weight: 277 lbs, BMI: 52.3, Temperature: 97.6 F, Pulse: 92 bpm, Respiratory Rate: 18 breaths/min, Blood Pressure: 96/51 mmHg. Respiratory Normal work of breathing on room air. General Notes: 11/27/2022: A tiny opening remains with slough on the surface. Edema control remains excellent. Integumentary (Hair, Skin) Wound #3 status is Open. Original cause of wound was Trauma. The date acquired was: 06/28/2022. The wound has been in treatment 16 weeks. The wound is located on the Left,Posterior Lower Leg. The wound measures 0.3cm length x 0.3cm width x 0.1cm depth; 0.071cm^2 area and 0.007cm^3 volume. There is Fat Layer (Subcutaneous Tissue) exposed. There is no tunneling or undermining noted. There is a small amount of serosanguineous drainage noted. The wound margin is flat and intact. There is small (1-33%) red granulation within the wound bed. There is a large (67-100%) amount of necrotic tissue within the wound bed including Eschar and Adherent Slough. The periwound skin appearance had no abnormalities noted for texture. The periwound skin appearance had no abnormalities noted for moisture. The periwound skin appearance had no abnormalities noted for color. Periwound temperature was noted as No Abnormality. The periwound has tenderness on palpation. Assessment Active Problems ICD-10 Non-pressure chronic ulcer of left calf with fat layer exposed Morbid (severe) obesity due to excess calories Essential (primary) hypertension Localized edema Procedures Wound #3 Pre-procedure diagnosis of Wound #3 is a Venous Leg Ulcer located on the Left,Posterior Lower Leg .Severity of Tissue Pre Debridement is: Fat layer exposed. There was a Selective/Open Wound Non-Viable Tissue Debridement with a total area of 0.07 sq cm performed by [name], MD. With  the following instrument(s): Curette Material removed includes [facility] after achieving pain control using Lidocaine 4% Topical Solution. No specimens were taken. A time out was conducted at 16:19, prior to the start of the procedure. A Minimum amount of bleeding was controlled with Pressure. The procedure was tolerated well. Post Debridement Measurements: 0.3cm length x 0.3cm width x 0.1cm depth; 0.007cm^3 volume. Character of Wound/Ulcer Post Debridement is improved. Severity of Tissue Post Debridement is: Fat layer exposed. Post procedure Diagnosis Wound #3: Same as Pre-Procedure General Notes: Scribed for Dr Lady Gary by Brenton Grills RN. Plan Follow-up Appointments: Return Appointment in 1 week. Anesthetic: (In clinic) Topical Lidocaine 4% applied to wound bed Bathing/ Shower/ Hygiene: May shower with protection but do not get wound dressing(s) wet. Protect dressing(s) with water repellant cover (for example, large plastic bag) or a cast cover and may then take shower. WOUND #3: - Lower Leg Wound Laterality: Left, Posterior Topical: Gentamicin Discharge Instructions: As directed by physician Topical: Mupirocin Ointment RENIYAH, BURGO ([id number]) [id number] Page 10 of 12 Discharge Instructions: Apply  Mupirocin (Bactroban) as instructed Prim Dressing: Promogran Prisma Matrix, 4.34 (sq in) (silver collagen) ary Discharge Instructions: Moisten collagen with saline or hydrogel Secondary Dressing: Woven Gauze Sponges 2x2 in Discharge Instructions: Apply over primary dressing as directed. Com pression Wrap: Urgo K2 Lite, (equivalent to a 3 layer) two layer compression system, regular Discharge Instructions: Apply Urgo K2 Lite as directed (alternative to 3 layer compression). 11/27/2022: A tiny opening remains with slough on the surface. Edema control remains excellent. I used a curette to debride slough from the wound surface.We will continue topical gentamicin  and mupirocin with Prisma silver collagen and Urgo light 3 layer equivalent wrap. Follow-up in 1 week. I am hopeful she will be healed in short order, perhaps 1 to 2 weeks. Electronic Signature(s) Signed: 11/27/2022 4:25:13 PM By: [name] MD FACS Entered By: [name] on 11/27/2022 16:25:13 -------------------------------------------------------------------------------- HxROS Details Patient Name: Date of Service: [name], [name] 11/27/2022 4:00 PM Medical Record Number: [id number] Patient Account Number: [id number] Date of Birth/Sex: Treating RN: [birth year] [name] (61 y.o. F) Primary Care Provider: Nira Conn Other Clinician: Referring Provider: Treating Provider/Extender: Andrey Campanile in Treatment: 1 Information Obtained From Patient Eyes Medical History: Negative for: Cataracts; Glaucoma; Optic Neuritis Ear/Nose/Mouth/Throat Medical History: Negative for: Chronic sinus problems/congestion; Middle ear problems Hematologic/Lymphatic Medical History: Positive for: Anemia - iron Negative for: Hemophilia; Human Immunodeficiency Virus; Lymphedema; Sickle Cell Disease Respiratory Medical History: Positive for: Sleep Apnea - CPAP Negative for: Aspiration; Asthma; Chronic Obstructive Pulmonary Disease (COPD); Pneumothorax; Tuberculosis Cardiovascular Medical History: Positive for: Hypertension; Peripheral Venous Disease Negative for: Angina; Arrhythmia; Congestive Heart Failure; Coronary Artery Disease; Deep Vein Thrombosis; Hypotension; Myocardial Infarction; Peripheral Arterial Disease; Phlebitis; Vasculitis Gastrointestinal Medical History: Negative for: Cirrhosis ; Colitis; Crohns; Hepatitis A; Hepatitis B; Hepatitis C Endocrine Medical HistoryTASHICA, EYERLY ([id number]) [id number] Page 11 of 12 Negative for: Type I Diabetes; Type II Diabetes Genitourinary Medical History: Negative for: End Stage  Renal Disease Past Medical History Notes: years ago kidney stones Immunological Medical History: Negative for: Lupus Erythematosus; Raynauds; Scleroderma Integumentary (Skin) Medical History: Negative for: History of Burn Musculoskeletal Medical History: Negative for: Gout; Rheumatoid Arthritis; Osteoarthritis; Osteomyelitis Neurologic Medical History: Negative for: Dementia; Neuropathy; Quadriplegia; Paraplegia; Seizure Disorder Oncologic Medical History: Negative for: Received Chemotherapy; Received Radiation Past Medical History Notes: skin Ca removed from back years ago Psychiatric Medical History: Negative for: Anorexia/bulimia; Confinement Anxiety Immunizations Pneumococcal Vaccine: Received Pneumococcal Vaccination: No Implantable Devices None Hospitalization / Surgery History Type of Hospitalization/Surgery cholecystectomy 1980s nephrolithasis 1980s plate and rod in right elbow surgery 2010 Family and Social History Cancer: Yes - Mother; Diabetes: Yes - Father; Heart Disease: No; Hypertension: Yes - Mother; Kidney Disease: Yes - Mother; Lung Disease: Yes - Father; Seizures: No; Stroke: No; Thyroid Problems: Yes - Paternal Grandparents; Tuberculosis: No; Never smoker; Marital Status - Married; Alcohol Use: Never; Drug Use: No History; Caffeine Use: Daily - coffee; Financial Concerns: No; Food, Clothing or Shelter Needs: No; Support System Lacking: No; Transportation Concerns: No Electronic Signature(s) Signed: 11/27/2022 4:25:43 PM By: [name] MD FACS Entered By: [name] on 11/27/2022 16:22:37 -------------------------------------------------------------------------------- SuperBill Details Patient Name: Date of Service: [address], [name] 11/27/2022 [name] ([id number]) [id number] Page 12 of 12 Medical Record Number: [id number] Patient Account Number: [id number] Date of Birth/Sex: Treating RN: [birth year], [name] (62  y.o. F) Primary Care Provider: Nira Conn Other Clinician: Referring Provider: Treating Provider/Extender: Andrey Campanile in Treatment: 44 Diagnosis Coding ICD-10 Codes Code Description [phone]  Non-pressure chronic ulcer of left calf with fat layer exposed E66.01 Morbid (severe) obesity due to excess calories I10 Essential (primary) hypertension R60.0 Localized edema Facility Procedures : CPT4 Code: [id number] Description: 97597 - DEBRIDE WOUND 1ST 20 SQ CM OR < ICD-10 Diagnosis Description L97.222 Non-pressure chronic ulcer of left calf with fat layer exposed Modifier: Quantity: 1 Physician Procedures : CPT4 Code Description Modifier [id number] - WC PHYS LEVEL 4 - EST PT 25 ICD-10 Diagnosis Description L97.222 Non-pressure chronic ulcer of left calf with fat layer exposed E66.01 Morbid (severe) obesity due to excess calories I10 Essential (primary)  hypertension R60.0 Localized edema Quantity: 1 : [id number] 97597 - WC PHYS DEBR WO ANESTH 20 SQ CM ICD-10 Diagnosis Description L97.222 Non-pressure chronic ulcer of left calf with fat layer exposed Quantity: 1 Electronic Signature(s) Signed: 11/27/2022 4:25:29 PM By: [name] MD FACS Entered By: [name] on 11/27/2022 16:25:29

## 2022-12-04 ENCOUNTER — Ambulatory Visit (HOSPITAL_BASED_OUTPATIENT_CLINIC_OR_DEPARTMENT_OTHER): Payer: BC Managed Care – PPO | Admitting: General Surgery

## 2022-12-05 ENCOUNTER — Encounter (HOSPITAL_BASED_OUTPATIENT_CLINIC_OR_DEPARTMENT_OTHER): Payer: BC Managed Care – PPO | Admitting: General Surgery

## 2022-12-05 DIAGNOSIS — L97222 Non-pressure chronic ulcer of left calf with fat layer exposed: Secondary | ICD-10-CM | POA: Diagnosis not present

## 2022-12-07 ENCOUNTER — Other Ambulatory Visit: Payer: Self-pay | Admitting: Pulmonary Disease

## 2022-12-09 ENCOUNTER — Other Ambulatory Visit: Payer: Self-pay | Admitting: Family Medicine

## 2022-12-09 DIAGNOSIS — E039 Hypothyroidism, unspecified: Secondary | ICD-10-CM

## 2022-12-12 ENCOUNTER — Encounter (HOSPITAL_BASED_OUTPATIENT_CLINIC_OR_DEPARTMENT_OTHER): Payer: BC Managed Care – PPO | Attending: General Surgery | Admitting: General Surgery

## 2022-12-12 DIAGNOSIS — I872 Venous insufficiency (chronic) (peripheral): Secondary | ICD-10-CM | POA: Insufficient documentation

## 2022-12-12 DIAGNOSIS — G4730 Sleep apnea, unspecified: Secondary | ICD-10-CM | POA: Insufficient documentation

## 2022-12-12 DIAGNOSIS — L97222 Non-pressure chronic ulcer of left calf with fat layer exposed: Secondary | ICD-10-CM | POA: Diagnosis present

## 2022-12-12 DIAGNOSIS — L97212 Non-pressure chronic ulcer of right calf with fat layer exposed: Secondary | ICD-10-CM | POA: Diagnosis not present

## 2022-12-12 DIAGNOSIS — I10 Essential (primary) hypertension: Secondary | ICD-10-CM | POA: Insufficient documentation

## 2022-12-12 DIAGNOSIS — I890 Lymphedema, not elsewhere classified: Secondary | ICD-10-CM | POA: Insufficient documentation

## 2022-12-12 DIAGNOSIS — Z6841 Body Mass Index (BMI) 40.0 and over, adult: Secondary | ICD-10-CM | POA: Insufficient documentation

## 2022-12-12 NOTE — Progress Notes (Signed)
CAROLYNE, ZIERKE ([id number]) [id number] Page 1 of 11 Visit Report for 12/12/2022 Arrival Information Details Patient Name: Date of Service: [name] [name] 12/12/2022 3:00 PM Medical Record Number: [id number] Patient Account Number: [id number] Date of Birth/Sex: Treating RN: 1961/01/12 (62 y.o. [name], [name] Primary Care Anneta Rounds: Nira Conn Other Clinician: Referring Marciel Offenberger: Treating Makenize Messman/Extender: Andrey Campanile in Treatment: 46 Visit Information History Since Last Visit Added or deleted any medications: No Patient Arrived: Ambulatory Any new allergies or adverse reactions: No Arrival Time: 15:04 Had a fall or experienced change in No Accompanied By: self activities of daily living that may affect Transfer Assistance: None risk of falls: Patient Identification Verified: Yes Signs or symptoms of abuse/neglect since last visito No Secondary Verification Process Completed: Yes Hospitalized since last visit: No Patient Requires Transmission-Based Precautions: No Implantable device outside of the clinic excluding No Patient Has Alerts: No cellular tissue based products placed in the center since last visit: Has Dressing in Place as Prescribed: Yes Has Compression in Place as Prescribed: Yes Pain Present Now: Yes Electronic Signature(s) Signed: 12/12/2022 4:53:04 PM By: [name] RN, BSN Entered By: [name] on 12/12/2022 12:07:51 -------------------------------------------------------------------------------- Compression Therapy Details Patient Name: Date of Service: [name], [name] 12/12/2022 3:00 PM Medical Record Number: [id number] Patient Account Number: [id number] Date of Birth/Sex: Treating RN: 10/29/1960 (62 y.o. [name] Primary Care Tiajah Oyster: Nira Conn Other Clinician: Referring Kable Haywood: Treating Pasqualino Witherspoon/Extender: Andrey Campanile in  Treatment: 46 Compression Therapy Performed for Wound Assessment: Wound #4 Right,Posterior Lower Leg Performed By: Clinician [name], RN Compression Type: Double Layer Post Procedure Diagnosis Same as Pre-procedure Notes urgo lite Electronic Signature(s) Signed: 12/12/2022 4:53:04 PM By: [name] RN, BSN Entered By: [name] on 12/12/2022 12:46:22 [name] ([id number]) [id number] Page 2 of 11 -------------------------------------------------------------------------------- Compression Therapy Details Patient Name: Date of Service: [place], [name] [name] 12/12/2022 3:00 PM Medical Record Number: [id number] Patient Account Number: [id number] Date of Birth/Sex: Treating RN: [birth year] (62 y.o. [name] Primary Care Dajion Bickford: Nira Conn Other Clinician: Referring Nevin Grizzle: Treating Kirstan Fentress/Extender: Andrey Campanile in Treatment: 46 Compression Therapy Performed for Wound Assessment: Wound #3 Left,Posterior Lower Leg Performed By: Clinician [name], RN Compression Type: Double Layer Post Procedure Diagnosis Same as Pre-procedure Notes Stephannie Li Electronic Signature(s) Signed: 12/12/2022 4:53:04 PM By: [name] RN, BSN Entered By: [name] on 12/12/2022 12:46:22 -------------------------------------------------------------------------------- Encounter Discharge Information Details Patient Name: Date of Service: [address] [name] [name] 12/12/2022 3:00 PM Medical Record Number: [id number] Patient Account Number: [id number] Date of Birth/Sex: Treating RN: 03-Oct-1960 (62 y.o. [name] Primary Care Dionte Blaustein: Nira Conn Other Clinician: Referring Lavaris Sexson: Treating Arlys Scatena/Extender: Andrey Campanile in Treatment: 46 Encounter Discharge Information Items Post Procedure Vitals Discharge Condition: Stable Temperature (F): 97.9 Ambulatory  Status: Ambulatory Pulse (bpm): 86 Discharge Destination: Home Respiratory Rate (breaths/min): 18 Transportation: Private Auto Blood Pressure (mmHg): 108/74 Accompanied By: self Schedule Follow-up Appointment: Yes Clinical Summary of Care: Patient Declined Electronic Signature(s) Signed: 12/12/2022 4:53:04 PM By: [name] RN, BSN Entered By: [name] on 12/12/2022 13:07:31 -------------------------------------------------------------------------------- Lower Extremity Assessment Details Patient Name: Date of Service: [name] [name] 12/12/2022 3:00 PM Medical Record Number: [id number] Patient Account Number: [id number] Date of Birth/Sex: Treating RN: 1960/06/01 (62 y.o. [name] Primary Care Tabor Bartram: Nira Conn Other Clinician: Referring Kym Fenter: Treating Ionia Schey/Extender: Magdeline, Boxberger, Romie Minus ([id number]) [phone].pdf Page 3 of  11 Weeks in Treatment: 46 Edema Assessment Assessed: [Left: No] [Right: No] Edema: [Left: Yes] [Right: Yes] Calf Left: Right: Point of Measurement: From Medial Instep 38 cm 41.5 cm Ankle Left: Right: Point of Measurement: From Medial Instep 23 cm 24.5 cm Vascular Assessment Pulses: Dorsalis Pedis Palpable: [Left:Yes] [Right:Yes] Extremity colors, hair growth, and conditions: Extremity Color: [Left:Hyperpigmented] [Right:Hyperpigmented] Hair Growth on Extremity: [Left:No] [Right:No] Temperature of Extremity: [Left:Warm < 3 seconds] [Right:Warm < 3 seconds] Electronic Signature(s) Signed: 12/12/2022 4:53:04 PM By: [name] RN, BSN Entered By: [name] on 12/12/2022 12:17:04 -------------------------------------------------------------------------------- Multi Wound Chart Details Patient Name: Date of Service: [name], [name] [name] 12/12/2022 3:00 PM Medical Record Number: [id number] Patient Account Number: [id number] Date of Birth/Sex: Treating  RN: 1960-05-16 (62 y.o. F) Primary Care Anikin Prosser: Nira Conn Other Clinician: Referring Darla Mcdonald: Treating Romy Mcgue/Extender: Andrey Campanile in Treatment: 46 Vital Signs Height(in): 61 Pulse(bpm): 86 Weight(lbs): 277 Blood Pressure(mmHg): 108/74 Body Mass Index(BMI): 52.3 Temperature(F): 97.9 Respiratory Rate(breaths/min): 20 [3:Photos:] [N/A:N/A] Left, Posterior Lower Leg Right, Posterior Lower Leg N/A Wound Location: Trauma Gradually Appeared N/A Wounding Event: Venous Leg Ulcer Lymphedema N/A Primary Etiology: N/A Venous Leg Ulcer N/A Secondary Etiology: Anemia, Sleep Apnea, Hypertension, Anemia, Sleep Apnea, Hypertension, N/A Comorbid History: Peripheral Venous Disease Peripheral Venous Disease 06/28/2022 12/07/2022 N/A Date Acquired: 60 0 N/A Tania Ade of Treatment: KEITHA, REICHWEIN ([id number]) [id number] Page 4 of 11 Open Open N/A Wound Status: No No N/A Wound Recurrence: 0.4x0.2x0.1 0.8x0.6x0.1 N/A Measurements L x W x D (cm) 0.063 0.377 N/A A (cm) : rea 0.006 0.038 N/A Volume (cm) : 95.30% N/A N/A % Reduction in Area: 95.50% N/A N/A % Reduction in Volume: Full Thickness Without Exposed Full Thickness Without Exposed N/A Classification: Support Structures Support Structures Small Medium N/A Exudate A mount: Serous Serosanguineous N/A Exudate Type: amber red, brown N/A Exudate Color: Flat and Intact Flat and Intact N/A Wound Margin: None Present (0%) Large (67-100%) N/A Granulation A mount: N/A Red N/A Granulation Quality: Large (67-100%) Small (1-33%) N/A Necrotic A mount: Fat Layer (Subcutaneous Tissue): Yes Fat Layer (Subcutaneous Tissue): Yes N/A Exposed Structures: Fascia: No Fascia: No Tendon: No Tendon: No Muscle: No Muscle: No Joint: No Joint: No Bone: No Bone: No Small (1-33%) Small (1-33%) N/A Epithelialization: Debridement - Selective/Open Wound Debridement -  Selective/Open Wound N/A Debridement: Pre-procedure Verification/Time Out 15:40 15:40 N/A Taken: Lidocaine 4% Topical Solution Lidocaine 4% Topical Solution N/A Pain Control: [employer] N/A Tissue Debrided: Non-Viable Tissue Non-Viable Tissue N/A Level: 0.06 0.38 N/A Debridement A (sq cm): rea Curette Curette N/A Instrument: Minimum Minimum N/A Bleeding: Pressure Pressure N/A Hemostasis A chieved: 2 2 N/A Procedural Pain: 0 0 N/A Post Procedural Pain: Procedure was tolerated well Procedure was tolerated well N/A Debridement Treatment Response: 0.4x0.2x0.1 0.8x0.6x0.1 N/A Post Debridement Measurements L x W x D (cm) 0.006 0.038 N/A Post Debridement Volume: (cm) Rash: Yes No Abnormalities Noted N/A Periwound Skin Texture: No Abnormalities Noted No Abnormalities Noted N/A Periwound Skin Moisture: Erythema: No Erythema: Yes N/A Periwound Skin Color: N/A Measured: 1.5cm N/A Erythema Measurement: No Abnormality No Abnormality N/A Temperature: N/A Yes N/A Tenderness on Palpation: Compression Therapy Compression Therapy N/A Procedures Performed: Debridement Debridement Treatment Notes Wound #3 (Lower Leg) Wound Laterality: Left, Posterior Cleanser Soap and Water Discharge Instruction: May shower and wash wound with dial antibacterial soap and water prior to dressing change. Wound Cleanser Discharge Instruction: Cleanse the wound with wound cleanser prior to applying a clean dressing using gauze sponges, not tissue or  cotton balls. Peri-Wound Care Topical Gentamicin Discharge Instruction: As directed by physician Mupirocin Ointment Discharge Instruction: Apply Mupirocin (Bactroban) as instructed Primary Dressing Hydrofera Blue Ready Transfer Foam, 2.5x2.5 (in/in) Discharge Instruction: Apply directly to wound bed as directed Secondary Dressing Woven Gauze Sponges 2x2 in Discharge Instruction: Apply over primary dressing as directed. Secured With Compression  Wrap Urgo K2 Lite, (equivalent to a 3 layer) two layer compression system, regular Discharge Instruction: Apply Urgo K2 Lite as directed (alternative to 3 layer compression). KASHEA, BARTONE ([id number]) [id number] Page 5 of 11 Compression Stockings Add-Ons Wound #4 (Lower Leg) Wound Laterality: Right, Posterior Cleanser Soap and Water Discharge Instruction: May shower and wash wound with dial antibacterial soap and water prior to dressing change. Wound Cleanser Discharge Instruction: Cleanse the wound with wound cleanser prior to applying a clean dressing using gauze sponges, not tissue or cotton balls. Peri-Wound Care Topical Gentamicin Discharge Instruction: As directed by physician Mupirocin Ointment Discharge Instruction: Apply Mupirocin (Bactroban) as instructed Primary Dressing Hydrofera Blue Ready Transfer Foam, 2.5x2.5 (in/in) Discharge Instruction: Apply directly to wound bed as directed Secondary Dressing Woven Gauze Sponges 2x2 in Discharge Instruction: Apply over primary dressing as directed. Secured With Compression Wrap Urgo K2 Lite, (equivalent to a 3 layer) two layer compression system, regular Discharge Instruction: Apply Urgo K2 Lite as directed (alternative to 3 layer compression). Compression Stockings Add-Ons Electronic Signature(s) Signed: 12/12/2022 4:15:33 PM By: [name] MD FACS Entered By: [name] on 12/12/2022 13:15:33 -------------------------------------------------------------------------------- Multi-Disciplinary Care Plan Details Patient Name: Date of Service: [place], [name] [name] 12/12/2022 3:00 PM Medical Record Number: [id number] Patient Account Number: [id number] Date of Birth/Sex: Treating RN: 11/17/1960 (62 y.o. [name] Primary Care Vondra Aldredge: Nira Conn Other Clinician: Referring Charelle Petrakis: Treating Tevis Dunavan/Extender: Andrey Campanile in Treatment:  46 Multidisciplinary Care Plan reviewed with physician Active Inactive Venous Leg Ulcer Nursing Diagnoses: Knowledge deficit related to disease process and management Potential for venous Insuffiency (use before diagnosis confirmed) Goals: Patient will maintain optimal edema control Date Initiated: 07/05/2022 Target Resolution Date: 01/03/2023 SHEKITA, GOOGE ([id number]) [phone].pdf Page 6 of 11 Goal Status: Active Interventions: Assess peripheral edema status every visit. Compression as ordered Provide education on venous insufficiency Treatment Activities: Therapeutic compression applied : 07/05/2022 Notes: Electronic Signature(s) Signed: 12/12/2022 4:53:04 PM By: [name] RN, BSN Entered By: [name] on 12/12/2022 12:31:58 -------------------------------------------------------------------------------- Pain Assessment Details Patient Name: Date of Service: [name], [name] 12/12/2022 3:00 PM Medical Record Number: [id number] Patient Account Number: [id number] Date of Birth/Sex: Treating RN: 03-19-1961 (62 y.o. [name] Primary Care Seddrick Flax: Nira Conn Other Clinician: Referring Keisean Skowron: Treating Rada Zegers/Extender: Andrey Campanile in Treatment: 46 Active Problems Location of Pain Severity and Description of Pain Patient Has Paino Yes Site Locations Pain Location: Pain in Ulcers With Dressing Change: Yes Duration of the Pain. Constant / Intermittento Intermittent Rate the pain. Current Pain Level: 0 Character of Pain Describe the Pain: Tender Pain Management and Medication Current Pain Management: Other: tolerable Is the Current Pain Management Adequate: Adequate How does your wound impact your activities of daily livingo Sleep: No Bathing: No Appetite: No Relationship With Others: No Bladder Continence: No Emotions: No Bowel Continence: No Work: No Toileting: No Drive:  No Dressing: No Hobbies: No Electronic Signature(s) Signed: 12/12/2022 4:53:04 PM By: [name] RN, BSN Neville,Signed: 12/12/2022 4:53:04 PM By: [name] RN, BSN Romie Minus ([id number]) [id number].pdf Page 7 of 11 Entered By: [name] on 12/12/2022 12:09:51 -------------------------------------------------------------------------------- Patient/Caregiver  Education Details Patient Name: Date of Service: [name] 9/5/2024andnbsp3:00 PM Medical Record Number: [id number] Patient Account Number: [id number] Date of Birth/Gender: Treating RN: 08-10-1960 (62 y.o. [name] Primary Care Physician: Nira Conn Other Clinician: Referring Physician: Treating Physician/Extender: Andrey Campanile in Treatment: 38 Education Assessment Education Provided To: Patient Education Topics Provided Venous: Methods: Explain/Verbal Responses: Reinforcements needed, State content correctly Wound/Skin Impairment: Methods: Explain/Verbal Responses: Reinforcements needed, State content correctly Electronic Signature(s) Signed: 12/12/2022 4:53:04 PM By: [name] RN, BSN Entered By: [name] on 12/12/2022 12:32:27 -------------------------------------------------------------------------------- Wound Assessment Details Patient Name: Date of Service: [name], [name] 12/12/2022 3:00 PM Medical Record Number: [id number] Patient Account Number: [id number] Date of Birth/Sex: Treating RN: 07/20/1960 (62 y.o. [name] Primary Care Imre Vecchione: Nira Conn Other Clinician: Referring Wayden Schwertner: Treating Dewell Monnier/Extender: Andrey Campanile in Treatment: 46 Wound Status Wound Number: 3 Primary Venous Leg Ulcer Etiology: Wound Location: Left, Posterior Lower Leg Wound Status: Open Wounding Event: Trauma Comorbid Anemia, Sleep Apnea, Hypertension, Peripheral Venous Date Acquired:  06/28/2022 History: Disease Weeks Of Treatment: 18 Clustered Wound: No Photos MISSEY, MCCRIGHT ([id number]) [id number] Page 8 of 11 Wound Measurements Length: (cm) 0.4 Width: (cm) 0.2 Depth: (cm) 0.1 Area: (cm) 0.063 Volume: (cm) 0.006 % Reduction in Area: 95.3% % Reduction in Volume: 95.5% Epithelialization: Small (1-33%) Tunneling: No Undermining: No Wound Description Classification: Full Thickness Without Exposed Support Structures Wound Margin: Flat and Intact Exudate Amount: Small Exudate Type: Serous Exudate Color: amber Foul Odor After Cleansing: No Slough/Fibrino No Wound Bed Granulation Amount: None Present (0%) Exposed Structure Necrotic Amount: Large (67-100%) Fascia Exposed: No Necrotic Quality: Adherent Slough Fat Layer (Subcutaneous Tissue) Exposed: Yes Tendon Exposed: No Muscle Exposed: No Joint Exposed: No Bone Exposed: No Periwound Skin Texture Texture Color No Abnormalities Noted: Yes No Abnormalities Noted: Yes Moisture Temperature / Pain No Abnormalities Noted: Yes Temperature: No Abnormality Treatment Notes Wound #3 (Lower Leg) Wound Laterality: Left, Posterior Cleanser Soap and Water Discharge Instruction: May shower and wash wound with dial antibacterial soap and water prior to dressing change. Wound Cleanser Discharge Instruction: Cleanse the wound with wound cleanser prior to applying a clean dressing using gauze sponges, not tissue or cotton balls. Peri-Wound Care Topical Gentamicin Discharge Instruction: As directed by physician Mupirocin Ointment Discharge Instruction: Apply Mupirocin (Bactroban) as instructed Primary Dressing Hydrofera Blue Ready Transfer Foam, 2.5x2.5 (in/in) Discharge Instruction: Apply directly to wound bed as directed Secondary Dressing Woven Gauze Sponges 2x2 in Discharge Instruction: Apply over primary dressing as directed. Secured With Compression Wrap Urgo K2 Lite,  (equivalent to a 3 layer) two layer compression system, regular Discharge Instruction: Apply Urgo K2 Lite as directed (alternative to 3 layer compression). ELVIA, BROSCH ([id number]) [id number] Page 9 of 11 Compression Stockings Add-Ons Electronic Signature(s) Signed: 12/12/2022 4:53:04 PM By: [name] RN, BSN Entered By: [name] on 12/12/2022 12:28:03 -------------------------------------------------------------------------------- Wound Assessment Details Patient Name: Date of Service: [place], [name] [name] 12/12/2022 3:00 PM Medical Record Number: [id number] Patient Account Number: [id number] Date of Birth/Sex: Treating RN: [birth year] (62 y.o. [name] Primary Care Manpreet Strey: Nira Conn Other Clinician: Referring Darryl Willner: Treating Cristabel Bicknell/Extender: Andrey Campanile in Treatment: 46 Wound Status Wound Number: 4 Primary Etiology: Lymphedema Wound Location: Right, Posterior Lower Leg Secondary Venous Leg Ulcer Etiology: Wounding Event: Gradually Appeared Wound Status: Open Date Acquired: 12/07/2022 Comorbid History: Anemia, Sleep Apnea, Hypertension, Peripheral Venous Weeks Of Treatment: 0 Disease Clustered Wound: No Photos  Wound Measurements Length: (cm) 0.8 Width: (cm) 0.6 Depth: (cm) 0.1 Area: (cm) 0.377 Volume: (cm) 0.038 % Reduction in Area: % Reduction in Volume: Epithelialization: Small (1-33%) Tunneling: No Undermining: No Wound Description Classification: Full Thickness Without Exposed Suppor Wound Margin: Flat and Intact Exudate Amount: Medium Exudate Type: Serosanguineous Exudate Color: red, brown t Structures Foul Odor After Cleansing: No Slough/Fibrino Yes Wound Bed Granulation Amount: Large (67-100%) Exposed Structure Granulation Quality: Red Fascia Exposed: No Necrotic Amount: Small (1-33%) Fat Layer (Subcutaneous Tissue) Exposed: Yes Necrotic Quality: Adherent  Slough Tendon Exposed: No Muscle Exposed: No Joint Exposed: No Bone Exposed: No [address] JENAN, KOVARIK ([id number]) [id number] Page 10 of 11 No Abnormalities Noted: Yes No Abnormalities Noted: No Erythema: Yes Moisture Erythema Measurement: Measured No Abnormalities Noted: Yes 1.5 cm Temperature / Pain Temperature: No Abnormality Tenderness on Palpation: Yes Treatment Notes Wound #4 (Lower Leg) Wound Laterality: Right, Posterior Cleanser Soap and Water Discharge Instruction: May shower and wash wound with dial antibacterial soap and water prior to dressing change. Wound Cleanser Discharge Instruction: Cleanse the wound with wound cleanser prior to applying a clean dressing using gauze sponges, not tissue or cotton balls. Peri-Wound Care Topical Gentamicin Discharge Instruction: As directed by physician Mupirocin Ointment Discharge Instruction: Apply Mupirocin (Bactroban) as instructed Primary Dressing Hydrofera Blue Ready Transfer Foam, 2.5x2.5 (in/in) Discharge Instruction: Apply directly to wound bed as directed Secondary Dressing Woven Gauze Sponges 2x2 in Discharge Instruction: Apply over primary dressing as directed. Secured With Compression Wrap Urgo K2 Lite, (equivalent to a 3 layer) two layer compression system, regular Discharge Instruction: Apply Urgo K2 Lite as directed (alternative to 3 layer compression). Compression Stockings Add-Ons Electronic Signature(s) Signed: 12/12/2022 4:53:04 PM By: [name] RN, BSN Entered By: [name] on 12/12/2022 12:28:56 -------------------------------------------------------------------------------- Vitals Details Patient Name: Date of Service: [name], [name] 12/12/2022 3:00 PM Medical Record Number: [id number] Patient Account Number: [id number] Date of Birth/Sex: Treating RN: 1960-11-28 (62 y.o. [name] Primary Care Hamzeh Tall: Nira Conn Other Clinician: Referring Madeline Bebout: Treating Ashliegh Parekh/Extender: Andrey Campanile in Treatment: 46 Vital Signs Time Taken: 15:12 Temperature (F): 97.9 Height (in): 61 Pulse (bpm): 86 Weight (lbs): 277 Respiratory Rate (breaths/min): 20 Body Mass Index (BMI): 52.3 Blood Pressure (mmHg): 108/74 Reference Range: 80 - 120 mg / dl Electronic Signature(s) ABBYGAEL, TARNOWSKI ([id number]) [id number] Page 11 of 11 Signed: 12/12/2022 4:53:04 PM By: [name] RN, BSN Entered By: [name] on 12/12/2022 12:13:22

## 2022-12-12 NOTE — Progress Notes (Signed)
NAIESHA, HARVICK ([id number]) [id number] Page 1 of 14 Visit Report for 12/12/2022 Chief Complaint Document Details Patient Name: Date of Service: [place], [name] 12/12/2022 3:00 PM Medical Record Number: [id number] Patient Account Number: [id number] Date of Birth/Sex: Treating RN: 03/19/1961 (62 y.o. F) Primary Care Provider: Nira Conn Other Clinician: Referring Provider: Treating Provider/Extender: Andrey Campanile in Treatment: [phone] Information Obtained from: Patient Chief Complaint RLE ulcer Electronic Signature(s) Signed: 12/12/2022 4:16:17 PM By: [name] MD FACS Entered By: [name] on 12/12/2022 13:16:17 -------------------------------------------------------------------------------- Debridement Details Patient Name: Date of Service: [name], [name] 12/12/2022 3:00 PM Medical Record Number: [id number] Patient Account Number: [id number] Date of Birth/Sex: Treating RN: 1961-02-03 (62 y.o. [name] Primary Care Provider: Nira Conn Other Clinician: Referring Provider: Treating Provider/Extender: Andrey Campanile in Treatment: 46 Debridement Performed for Assessment: Wound #4 Right,Posterior Lower Leg Performed By: Physician [name], MD Debridement Type: Debridement Severity of Tissue Pre Debridement: Fat layer exposed Level of Consciousness (Pre-procedure): Awake and Alert Pre-procedure Verification/Time Out Yes - 15:40 Taken: Start Time: 15:42 Pain Control: Lidocaine 4% T opical Solution Percent of Wound Bed Debrided: 100% T Area Debrided (cm): otal 0.38 Tissue and other material debrided: Non-Viable, Slough, Slough Level: Non-Viable Tissue Debridement Description: Selective/Open Wound Instrument: Curette Bleeding: Minimum Hemostasis Achieved: Pressure Procedural Pain: 2 Post Procedural Pain: 0 Response to Treatment: Procedure was tolerated  well Level of Consciousness (Post- Awake and Alert procedure): Post Debridement Measurements of Total Wound Length: (cm) 0.8 Width: (cm) 0.6 Depth: (cm) 0.1 Volume: (cm) 0.038 Character of Wound/Ulcer Post Debridement: Improved Severity of Tissue Post Debridement: Fat layer exposed MEOSHA, KOVACK ([id number]) [id number] Page 2 of 14 Post Procedure Diagnosis Same as Pre-procedure Notes Scribed for Dr Lady Gary by [name], RN. Electronic Signature(s) Signed: 12/12/2022 4:20:36 PM By: [name] MD FACS Signed: 12/12/2022 4:53:04 PM By: [name] RN, BSN Entered By: [name] on 12/12/2022 12:48:32 -------------------------------------------------------------------------------- Debridement Details Patient Name: Date of Service: [place], [name] 12/12/2022 3:00 PM Medical Record Number: [id number] Patient Account Number: [id number] Date of Birth/Sex: Treating RN: 05-27-1960 (62 y.o. [name], [name] Primary Care Provider: Nira Conn Other Clinician: Referring Provider: Treating Provider/Extender: Andrey Campanile in Treatment: 46 Debridement Performed for Assessment: Wound #3 Left,Posterior Lower Leg Performed By: Physician [name], MD Debridement Type: Debridement Severity of Tissue Pre Debridement: Fat layer exposed Level of Consciousness (Pre-procedure): Awake and Alert Pre-procedure Verification/Time Out Yes - 15:40 Taken: Start Time: 15:42 Pain Control: Lidocaine 4% T opical Solution Percent of Wound Bed Debrided: 100% T Area Debrided (cm): otal 0.06 Tissue and other material debrided: Non-Viable, Slough, Slough Level: Non-Viable Tissue Debridement Description: Selective/Open Wound Instrument: Curette Bleeding: Minimum Hemostasis Achieved: Pressure Procedural Pain: 2 Post Procedural Pain: 0 Response to Treatment: Procedure was tolerated well Level of Consciousness (Post- Awake  and Alert procedure): Post Debridement Measurements of Total Wound Length: (cm) 0.4 Width: (cm) 0.2 Depth: (cm) 0.1 Volume: (cm) 0.006 Character of Wound/Ulcer Post Debridement: Improved Severity of Tissue Post Debridement: Fat layer exposed Post Procedure Diagnosis Same as Pre-procedure Notes Scribed for Dr Lady Gary by [name], RN. Electronic Signature(s) Signed: 12/12/2022 4:20:36 PM By: [name] MD FACS Signed: 12/12/2022 4:53:04 PM By: [name] RN, BSN Entered By: [name] on 12/12/2022 12:49:16 [name] ([id number]) [id number] Page 3 of 14 -------------------------------------------------------------------------------- HPI Details Patient Name: Date of Service: [name] 12/12/2022 3:00 PM Medical Record Number: [id number]  Patient Account Number: [id number] Date of Birth/Sex: Treating RN: 1960-12-21 (62 y.o. F) Primary Care Provider: Nira Conn Other Clinician: Referring Provider: Treating Provider/Extender: Andrey Campanile in Treatment: 67 History of Present Illness HPI Description: 02/16/2020 upon evaluation today patient actually appears to be doing poorly in regard to her left medial lower extremity ulcer. This is actually an area that she tells me she has had intermittent issues with over the years although has been closed for some time she typically uses compression right now she has juxta lite compression wraps. With that being said she tells me that this nonetheless open several weeks/months ago and has been given her trouble since. She does have a history of chronic venous insufficiency she is seeing specialist for this in the past she has had an ablation as well as sclerotherapy. With that being said she also has hypertension chronically which is managed by her primary care provider. In general she seems to be worsening overall with regard to the wound and states that  she finally realized that she needed to come in and have somebody look at this and not continue to try to manage this on her own. No fevers, chills, nausea, vomiting, or diarrhea. 02/23/2020 on evaluation today patient appears to be doing well with regard to her wound. This is showing some signs of improvement which is great news still were not quite at the point where I would like to be as far as the overall appearance of the wound is concerned but I do believe this is better than last week. I do believe the Iodoflex is helping as well. 03/08/2020 upon evaluation today patient appears to be doing well with regard to her wound. She has been tolerating the dressing changes without complication. Fortunately I feel like she has made great progress with the Iodoflex but I feel like it may be the point rest to switch to something else possibly a collagen- based dressing at this time. 03/15/2020 upon evaluation today patient appears to be doing excellent in regard to her leg ulcer. She has been tolerating the dressing changes without complication. Fortunately there is no signs of active infection. Overall she is measuring a little bit smaller today which is great news. 03/22/2020 upon evaluation today patient appears to be doing well with regard to her wound. She has been tolerating the dressing changes without complication. Fortunately there is no signs of active infection at this time. 03/28/2020; patient I do not usually see however she has a wound on the left anterior lower leg secondary to chronic venous insufficiency we have been using silver collagen under compression. She arrives in clinic with a nonviable surface requiring debridement 04/12/2020 upon evaluation today patient appears to be doing well all things considered with regard to her leg ulcer. She is tolerating the dressing changes without complication there is minimal dry skin around the edges of the wound that may be trapping and stopping some  of the events of the new skin I am can work on that today. Otherwise the surface of the wound appears to be doing excellent. 04/19/2020 upon evaluation today patient appears to be doing well with regard to her leg ulcer. She has been tolerating dressing changes without complication. Fortunately there is no signs of active infection at this time. No fever chills noted overall very pleased with how things seem to be progressing. 04/26/2020 on evaluation today patient appears to be doing well with regard to her wound currently. Is showing signs of  excellent improvement overall is filling in nicely and there does not appear to be any signs of infection. No fevers, chills, nausea, vomiting, or diarrhea. 05/03/2020 upon evaluation today patient appears to be doing well with regard to her wound on the leg. This overall showing signs of good improvement which is great she has some good epithelial growth and overall I think that things are moving in the correct direction. We likewise going to continue with the wound care measures as before since she seems to making such good improvement. 2/3; venous wound on the left medial leg. This is contracting. We are using Prisma and 3 layer compression. She has a stocking and waiting in the eventuality this heals. She is already using it on the right 05/17/2020 upon evaluation today patient appears to be doing well with regard to her leg ulcer. She has been tolerating the dressing changes without complication. Fortunately there is no signs of active infection which is great news and overall very pleased with where things stand today. No fevers, chills, nausea, vomiting, or diarrhea. 05/24/2020 upon evaluation today patient appears to be doing well with regard to her wound. Overall I feel like she is making excellent progress. There does not appear to be any signs of active infection which is great news. 05/31/2020 upon evaluation today patient appears to be doing well with  regard to her wound. There does not appear to be any signs of active infection which is great news overall I am extremely pleased with where things stand today. READMISSION 01/23/2022 She returns to clinic today with a new wound on her right posterior calf. She says that she was cleaning out an old shed near the middle of August this year and then noticed what seemed to be a bug bite on her right posterior calf. It was itchy, red, and raised. By the end of September, an ulcer had developed. She has been applying various topical creams such as hydrocortisone and others to the site. When it was not improving, she made an appointment in the wound care center. ABI in clinic today was 0.97. On her right posterior calf, there is a circular wound with necrotic fat and black eschar present. There is no purulent drainage or malodor. The periwound skin is in good condition with just a little induration that appears to be secondary to inflammation. 01/31/2022: The wound measures a little bit larger today, but overall is quite a bit cleaner. There is some undermining from 9:00 to 3:00. She is having some periwound itching and says that her wrap slid. 02/08/2022: Despite using an Unna boot first layer at the top of her wrap, it slid again and it looks like there is been some bruising at the wound site. The wound is about the same size in terms of dimensions. There is a fair amount of slough and nonviable tissue still present. Edema control is better than last week. 02/15/2022: The wound dimensions are about the same. There is less nonviable tissue present. The periwound erythema has improved. 02/22/2022: The wound has deteriorated over the past week. It is larger and the periwound is more edematous, erythematous, and indurated. It looks as though there has been more tissue breakdown with undermining present. She is having more pain. There is a foul odor coming from the wound. 02/26/2022: The wound looks quite a  bit better today and the odor is gone. I still do not have her culture data back, but she seems to be responding well to the [facility],  Romie Minus ([id number]) [id number] Page 4 of 14 Augmentin. 03/06/2022: Her wound continues to improve. There is still some slough accumulation, but the periwound skin is less inflamed. Her culture returned with a polymicrobial population including Pseudomonas and so levofloxacin was also prescribed. She did not understand why a second antibiotic was being added so she has not yet initiated this. 03/14/2022: The wound is about the same, to perhaps a little bit larger. There is a fair amount of slough accumulation on the surface, as well as some hypertrophic granulation tissue. She is still taking levofloxacin, but has completed taking Augmentin. She has her Jodie Echevaria compound with her today. 12/14; the patient has a significant circular wound on the posterior right calf. She is using Keystone and silver alginate under 3 layer compression. Her ABIs were within normal limits at 0.97. This may have been traumatic or an insect bite at the start I reviewed these records. 04/04/2022: Since I last saw the wound, it has contracted considerably. There is a fairly thick layer of slough on the surface, as it has not been debrided since the last time I did it. Edema control is excellent and the periwound skin is in much better condition. 04/12/2022: No significant change in the wound dimensions. It is filling with granulation tissue. Still with slough accumulation on the surface. 04/19/2022: The wound is smaller this week. There is some slough accumulation on the surface. 04/26/2022: The wound is smaller again this week and significantly cleaner. The wound surface is a little bit drier than ideal. 05/03/2022: The wound measurements were about the same, but visually it appears smaller. There is still some undermining at the top of the wound. Moisture balance is  better this week. 05/10/2022: The wound measured smaller today. There is still a fair amount of undermining present. Slough has built up on the surface. We are still awaiting snap VAC approval. 05/17/2022: The wound is a little bit smaller today. There is less slough on the surface, but the granulation tissue still is not very robust. She has been approved for snap VAC but will have a 20% coinsurance and it is not clear what that amount would end up being for her. 05/27/2022: The surface of the wound has deteriorated and it is gray and fibrotic. No significant odor, but the drainage on her dressing was a little bit purulent. 05/31/2022: The wound looks quite a bit better today. It is still a little fibrotic but no longer has purulent-looking drainage. The color is better. She spoke with her insurance company and is interested in trying the snap VAC, now that she is aware of the cost to her. 06/07/2022: After 1 week in the snap VAC, there has been substantial improvement to her wound. The undermined portion is closing in. The surface has a healthier color and appearance. There is very minimal slough accumulation. 06/14/2022: The more shallow part of the undermined portion of her wound has closed. There is still some undermining from about 1-2 o'clock. The surface continues to improve. Minimal slough accumulation. 06/28/2022: The wound is shallower this week and the undermining has essentially closed and completely. The surface tissue is improving. 07/05/2022: The depth is almost immeasurable and the surface is nearly flush with the surrounding skin. The undermining has been eliminated. There is light slough on the wound surface. 07/12/2022: There is a little bit of slough on the wound surface. The depth is about the same as last week. 07/19/2022: The wound is essentially flush with the surrounding skin surface.  There is slight slough present. No real change in the AP or transverse dimensions. 07/26/2022: The wound  measured a little bit smaller today. It is flush with the surrounding skin surface and there is good granulation tissue present. Minimal slough and biofilm accumulation. 08/02/2022: The right posterior leg wound is a little bit smaller. There is a little slough accumulation. She reported a new wound to [place] today. It is on her left posterior calf. It has been present for about 6 weeks. She is not sure of how it occurred. She has been trying to manage it at home with topical Neosporin and Band-Aids. The fat layer is exposed. The surface is fibrotic and covered with slough. 08/09/2022: The right posterior leg wound is smaller again this week. There is good granulation tissue on the surface with minimal slough accumulation. The left posterior calf wound has built up a thick layer of slough. It is still quite fibrotic underneath the slough, but there is a little bit of a pink color beginning to emerge. Edema control is good. 08/16/2022: Both wounds are smaller this week. There is minimal slough on the right posterior leg wound. There is slough that has reaccumulated on the left posterior calf wound but there is a little bit more granulation tissue emerging. 08/23/2022: Both wounds are about the same size this week, but the quality of the tissue and amount of granulation tissue on the left are both better. 08/30/2022: Both wounds measure smaller today, but there has been some tissue breakdown adjacent to the left leg wound. She says it has been itchy. There is a little bit of slough on the surface of both wounds. 09/06/2022: The right wound measures smaller today. There is minimal slough on the wound surface. The left wound is about the same size. The periwound tissue looks better this week. There is still a layer of fibrinous slough on the left wound. Edema control is good bilaterally. 6/7 the patient has wounds on bilateral calfs. We are using silver alginate on the right Iodoflex on the left under Urgo lite  compression. The wounds are measuring smaller. 09/20/2022: The right wound is flush with the surrounding skin. There is good granulation tissue with some biofilm and thin eschar present. On the left, there is some slough accumulation and the patient says it has been a little itchy this week. 09/27/2022: No significant change to the right wound. The left wound measures a little smaller, but still has some depth to it with slough accumulation. 10/04/2022: Nice improvement in both wounds. They are smaller and more superficial. The left wound has better granulation tissue and no longer has the hard fibrous surface. 10/11/2022: Both wounds continue to contract. There is minimal slough and eschar present. Granulation tissue continues to fill in on the left. Edema control is good. 10/21/2022: The right leg wound is about half the size as it was last week. There is a little perimeter eschar and some light slough on the surface. The left leg wound is about the same size, but shallower. There is still slough accumulation but the buds of granulation tissue are getting larger. Edema control remains excellent. 10/30/2022: The right leg wound is healed. The left leg wound is smaller with better granulation tissue and only a little bit of slough present. Edema control is excellent. CURTISA, HELMKAMP ([id number]) [id number] Page 5 of 14 11/07/2022: The left leg wound continues to contract and the surface continues to improve. Minimal slough with good granulation tissue filling in. 11/13/2022: Most  of the left leg wound has epithelialized. There is a small open area remaining that is about a millimeter in each dimension. There is slough accumulation present. 11/20/2022: There is a little bit of slough on the wound surface. It is about the same size as last week. Edema control is good. 11/27/2022: A tiny opening remains with slough on the surface. Edema control remains excellent. 12/05/2022: A small  opening remains underneath some eschar and slough. 12/12/2022: No difference in the wound on her left leg. Unfortunately, over the weekend, her right leg wound reopened. It is a little over a centimeter in diameter and very superficial with some slough on the surface. Electronic Signature(s) Signed: 12/12/2022 4:17:03 PM By: [name] MD FACS Entered By: [name] on 12/12/2022 13:17:03 -------------------------------------------------------------------------------- Physical Exam Details Patient Name: Date of Service: [name], [name] 12/12/2022 3:00 PM Medical Record Number: [id number] Patient Account Number: [id number] Date of Birth/Sex: Treating RN: November 15, 1960 (62 y.o. F) Primary Care Provider: Nira Conn Other Clinician: Referring Provider: Treating Provider/Extender: Andrey Campanile in Treatment: 46 Constitutional . . . . no acute distress. Respiratory Normal work of breathing on room air. Notes 12/12/2022: No difference in the wound on her left leg. Unfortunately, over the weekend, her right leg wound reopened. It is a little over a centimeter in diameter and very superficial with some slough on the surface. Electronic Signature(s) Signed: 12/12/2022 4:17:34 PM By: [name] MD FACS Entered By: [name] on 12/12/2022 13:17:34 -------------------------------------------------------------------------------- Physician Orders Details Patient Name: Date of Service: [name], [name] 12/12/2022 3:00 PM Medical Record Number: [id number] Patient Account Number: [id number] Date of Birth/Sex: Treating RN: Jan 06, 1961 (62 y.o. 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 - Dr. Lady Gary - room 2 Return A Wed 9/11 @ 3:45 pm Anesthetic (In clinic) Topical Lidocaine 4% applied to wound bed Bathing/ Shower/ Hygiene May shower with protection but do not get wound dressing(s) wet. Protect dressing(s) with water repellant cover (for example, large plastic bag) or a cast cover and may then take shower. Edema Control - Lymphedema / SCD / Other Elevate legs to the level of the heart or above for 30 minutes daily and/or when sitting for 3-4 times a day throughout the day. Avoid standing for long periods of time. Exercise regularly Wound Treatment Wound #3 - Lower Leg Wound Laterality: Left, Posterior Cleanser: Soap and Water 1 x Per Week Discharge Instructions: May shower and wash wound with dial antibacterial soap and water prior to dressing change. Cleanser: Wound Cleanser 1 x Per Week Discharge Instructions: Cleanse the wound with wound cleanser prior to applying a clean dressing using gauze sponges, not tissue or cotton balls. Topical: Gentamicin 1 x Per Week Discharge Instructions: As directed by physician Topical: Mupirocin Ointment 1 x Per Week Discharge Instructions: Apply Mupirocin (Bactroban) as instructed Prim Dressing: Hydrofera Blue Ready Transfer Foam, 2.5x2.5 (in/in) 1 x Per Week ary Discharge Instructions: Apply directly to wound bed as directed Secondary Dressing: Woven Gauze Sponges 2x2 in 1 x Per Week  Discharge Instructions: Apply over primary dressing as directed. Compression Wrap: Urgo K2 Lite, (equivalent to a 3 layer) two layer compression system, regular 1 x Per Week Discharge Instructions: Apply Urgo K2 Lite as  directed (alternative to 3 layer compression). Wound #4 - Lower Leg Wound Laterality: Right, Posterior Cleanser: Soap and Water 1 x Per Week Discharge Instructions: May shower and wash wound with dial antibacterial soap and water prior to dressing change. Cleanser: Wound Cleanser 1 x Per Week Discharge Instructions: Cleanse the wound with wound cleanser prior to applying a clean dressing using gauze sponges, not tissue or cotton balls. Topical: Gentamicin 1 x Per Week Discharge Instructions: As directed by physician Topical: Mupirocin Ointment 1 x Per Week Discharge Instructions: Apply Mupirocin (Bactroban) as instructed Prim Dressing: Hydrofera Blue Ready Transfer Foam, 2.5x2.5 (in/in) 1 x Per Week ary Discharge Instructions: Apply directly to wound bed as directed Secondary Dressing: Woven Gauze Sponges 2x2 in 1 x Per Week Discharge Instructions: Apply over primary dressing as directed. Compression Wrap: Urgo K2 Lite, (equivalent to a 3 layer) two layer compression system, regular 1 x Per Week Discharge Instructions: Apply Urgo K2 Lite as directed (alternative to 3 layer compression). Electronic Signature(s) Signed: 12/12/2022 4:20:36 PM By: [name] MD FACS Entered By: [name] on 12/12/2022 13:18:02 [name] ([id number]) [id number] Page 7 of 14 -------------------------------------------------------------------------------- Problem List Details Patient Name: Date of Service: [name] 12/12/2022 3:00 PM Medical Record Number: [id number] Patient Account Number: [id number] Date of Birth/Sex: Treating RN: 27-Jul-1960 (62 y.o. [name] Primary Care Provider: Nira Conn Other Clinician: Referring Provider: Treating Provider/Extender: Andrey Campanile in Treatment: [phone] Active Problems ICD-10 Encounter Code Description Active Date MDM Diagnosis L97.222 Non-pressure chronic ulcer of left  calf with fat layer exposed 08/02/2022 No Yes L97.212 Non-pressure chronic ulcer of right calf with fat layer exposed 12/12/2022 No Yes E66.01 Morbid (severe) obesity due to excess calories 01/23/2022 No Yes I10 Essential (primary) hypertension 01/23/2022 No Yes R60.0 Localized edema 01/23/2022 No Yes Inactive Problems Resolved Problems Electronic Signature(s) Signed: 12/12/2022 4:15:23 PM By: [name] MD FACS Entered By: [name] on 12/12/2022 13:15:23 -------------------------------------------------------------------------------- Progress Note Details Patient Name: Date of Service: [name], [name] 12/12/2022 3:00 PM Medical Record Number: [id number] Patient Account Number: [id number] Date of Birth/Sex: Treating RN: Jan 17, 1961 (62 y.o. F) Primary Care Provider: Nira Conn Other Clinician: Referring Provider: Treating Provider/Extender: Andrey Campanile in Treatment: 46 Subjective Chief Complaint Information obtained from Patient RLE ulcer History of Present Illness (HPI) 02/16/2020 upon evaluation today patient actually appears to be doing poorly in regard to her left medial lower extremity ulcer. This is actually an area that she PHILLIPA, MELLISH ([id number]) [id number] Page 8 of 14 tells me she has had intermittent issues with over the years although has been closed for some time she typically uses compression right now she has juxta lite compression wraps. With that being said she tells me that this nonetheless open several weeks/months ago and has been given her trouble since. She does have a history of chronic venous insufficiency she is seeing specialist for this in the past she has had an ablation as well as sclerotherapy. With that being said she also has hypertension chronically which is managed by her primary care provider. In general she seems to be worsening overall with regard to the wound and states  that she finally realized that she needed to come in and have somebody look at  this and not continue to try to manage this on her own. No fevers, chills, nausea, vomiting, or diarrhea. 02/23/2020 on evaluation today patient appears to be doing well with regard to her wound. This is showing some signs of improvement which is great news still were not quite at the point where I would like to be as far as the overall appearance of the wound is concerned but I do believe this is better than last week. I do believe the Iodoflex is helping as well. 03/08/2020 upon evaluation today patient appears to be doing well with regard to her wound. She has been tolerating the dressing changes without complication. Fortunately I feel like she has made great progress with the Iodoflex but I feel like it may be the point rest to switch to something else possibly a collagen- based dressing at this time. 03/15/2020 upon evaluation today patient appears to be doing excellent in regard to her leg ulcer. She has been tolerating the dressing changes without complication. Fortunately there is no signs of active infection. Overall she is measuring a little bit smaller today which is great news. 03/22/2020 upon evaluation today patient appears to be doing well with regard to her wound. She has been tolerating the dressing changes without complication. Fortunately there is no signs of active infection at this time. 03/28/2020; patient I do not usually see however she has a wound on the left anterior lower leg secondary to chronic venous insufficiency we have been using silver collagen under compression. She arrives in clinic with a nonviable surface requiring debridement 04/12/2020 upon evaluation today patient appears to be doing well all things considered with regard to her leg ulcer. She is tolerating the dressing changes without complication there is minimal dry skin around the edges of the wound that may be trapping and stopping  some of the events of the new skin I am can work on that today. Otherwise the surface of the wound appears to be doing excellent. 04/19/2020 upon evaluation today patient appears to be doing well with regard to her leg ulcer. She has been tolerating dressing changes without complication. Fortunately there is no signs of active infection at this time. No fever chills noted overall very pleased with how things seem to be progressing. 04/26/2020 on evaluation today patient appears to be doing well with regard to her wound currently. Is showing signs of excellent improvement overall is filling in nicely and there does not appear to be any signs of infection. No fevers, chills, nausea, vomiting, or diarrhea. 05/03/2020 upon evaluation today patient appears to be doing well with regard to her wound on the leg. This overall showing signs of good improvement which is great she has some good epithelial growth and overall I think that things are moving in the correct direction. We likewise going to continue with the wound care measures as before since she seems to making such good improvement. 2/3; venous wound on the left medial leg. This is contracting. We are using Prisma and 3 layer compression. She has a stocking and waiting in the eventuality this heals. She is already using it on the right 05/17/2020 upon evaluation today patient appears to be doing well with regard to her leg ulcer. She has been tolerating the dressing changes without complication. Fortunately there is no signs of active infection which is great news and overall very pleased with where things stand today. No fevers, chills, nausea, vomiting, or diarrhea. 05/24/2020 upon evaluation today patient appears to be doing  well with regard to her wound. Overall I feel like she is making excellent progress. There does not appear to be any signs of active infection which is great news. 05/31/2020 upon evaluation today patient appears to be doing well  with regard to her wound. There does not appear to be any signs of active infection which is great news overall I am extremely pleased with where things stand today. READMISSION 01/23/2022 She returns to clinic today with a new wound on her right posterior calf. She says that she was cleaning out an old shed near the middle of August this year and then noticed what seemed to be a bug bite on her right posterior calf. It was itchy, red, and raised. By the end of September, an ulcer had developed. She has been applying various topical creams such as hydrocortisone and others to the site. When it was not improving, she made an appointment in the wound care center. ABI in clinic today was 0.97. On her right posterior calf, there is a circular wound with necrotic fat and black eschar present. There is no purulent drainage or malodor. The periwound skin is in good condition with just a little induration that appears to be secondary to inflammation. 01/31/2022: The wound measures a little bit larger today, but overall is quite a bit cleaner. There is some undermining from 9:00 to 3:00. She is having some periwound itching and says that her wrap slid. 02/08/2022: Despite using an Unna boot first layer at the top of her wrap, it slid again and it looks like there is been some bruising at the wound site. The wound is about the same size in terms of dimensions. There is a fair amount of slough and nonviable tissue still present. Edema control is better than last week. 02/15/2022: The wound dimensions are about the same. There is less nonviable tissue present. The periwound erythema has improved. 02/22/2022: The wound has deteriorated over the past week. It is larger and the periwound is more edematous, erythematous, and indurated. It looks as though there has been more tissue breakdown with undermining present. She is having more pain. There is a foul odor coming from the wound. 02/26/2022: The wound looks quite  a bit better today and the odor is gone. I still do not have her culture data back, but she seems to be responding well to the Augmentin. 03/06/2022: Her wound continues to improve. There is still some slough accumulation, but the periwound skin is less inflamed. Her culture returned with a polymicrobial population including Pseudomonas and so levofloxacin was also prescribed. She did not understand why a second antibiotic was being added so she has not yet initiated this. 03/14/2022: The wound is about the same, to perhaps a little bit larger. There is a fair amount of slough accumulation on the surface, as well as some hypertrophic granulation tissue. She is still taking levofloxacin, but has completed taking Augmentin. She has her Jodie Echevaria compound with her today. 12/14; the patient has a significant circular wound on the posterior right calf. She is using Keystone and silver alginate under 3 layer compression. Her ABIs were within normal limits at 0.97. This may have been traumatic or an insect bite at the start I reviewed these records. 04/04/2022: Since I last saw the wound, it has contracted considerably. There is a fairly thick layer of slough on the surface, as it has not been debrided since the last time I did it. Edema control is excellent and the periwound  skin is in much better condition. 04/12/2022: No significant change in the wound dimensions. It is filling with granulation tissue. Still with slough accumulation on the surface. 04/19/2022: The wound is smaller this week. There is some slough accumulation on the surface. 04/26/2022: The wound is smaller again this week and significantly cleaner. The wound surface is a little bit drier than ideal. 05/03/2022: The wound measurements were about the same, but visually it appears smaller. There is still some undermining at the top of the wound. Moisture balance is better this week. PATRIECE, DANESE ([id number])  [id number] Page 9 of 14 05/10/2022: The wound measured smaller today. There is still a fair amount of undermining present. Slough has built up on the surface. We are still awaiting snap VAC approval. 05/17/2022: The wound is a little bit smaller today. There is less slough on the surface, but the granulation tissue still is not very robust. She has been approved for snap VAC but will have a 20% coinsurance and it is not clear what that amount would end up being for her. 05/27/2022: The surface of the wound has deteriorated and it is gray and fibrotic. No significant odor, but the drainage on her dressing was a little bit purulent. 05/31/2022: The wound looks quite a bit better today. It is still a little fibrotic but no longer has purulent-looking drainage. The color is better. She spoke with her insurance company and is interested in trying the snap VAC, now that she is aware of the cost to her. 06/07/2022: After 1 week in the snap VAC, there has been substantial improvement to her wound. The undermined portion is closing in. The surface has a healthier color and appearance. There is very minimal slough accumulation. 06/14/2022: The more shallow part of the undermined portion of her wound has closed. There is still some undermining from about 1-2 o'clock. The surface continues to improve. Minimal slough accumulation. 06/28/2022: The wound is shallower this week and the undermining has essentially closed and completely. The surface tissue is improving. 07/05/2022: The depth is almost immeasurable and the surface is nearly flush with the surrounding skin. The undermining has been eliminated. There is light slough on the wound surface. 07/12/2022: There is a little bit of slough on the wound surface. The depth is about the same as last week. 07/19/2022: The wound is essentially flush with the surrounding skin surface. There is slight slough present. No real change in the AP or transverse  dimensions. 07/26/2022: The wound measured a little bit smaller today. It is flush with the surrounding skin surface and there is good granulation tissue present. Minimal slough and biofilm accumulation. 08/02/2022: The right posterior leg wound is a little bit smaller. There is a little slough accumulation. She reported a new wound to [place] today. It is on her left posterior calf. It has been present for about 6 weeks. She is not sure of how it occurred. She has been trying to manage it at home with topical Neosporin and Band-Aids. The fat layer is exposed. The surface is fibrotic and covered with slough. 08/09/2022: The right posterior leg wound is smaller again this week. There is good granulation tissue on the surface with minimal slough accumulation. The left posterior calf wound has built up a thick layer of slough. It is still quite fibrotic underneath the slough, but there is a little bit of a pink color beginning to emerge. Edema control is good. 08/16/2022: Both wounds are smaller this week. There is minimal slough  on the right posterior leg wound. There is slough that has reaccumulated on the left posterior calf wound but there is a little bit more granulation tissue emerging. 08/23/2022: Both wounds are about the same size this week, but the quality of the tissue and amount of granulation tissue on the left are both better. 08/30/2022: Both wounds measure smaller today, but there has been some tissue breakdown adjacent to the left leg wound. She says it has been itchy. There is a little bit of slough on the surface of both wounds. 09/06/2022: The right wound measures smaller today. There is minimal slough on the wound surface. The left wound is about the same size. The periwound tissue looks better this week. There is still a layer of fibrinous slough on the left wound. Edema control is good bilaterally. 6/7 the patient has wounds on bilateral calfs. We are using silver alginate on the right  Iodoflex on the left under Urgo lite compression. The wounds are measuring smaller. 09/20/2022: The right wound is flush with the surrounding skin. There is good granulation tissue with some biofilm and thin eschar present. On the left, there is some slough accumulation and the patient says it has been a little itchy this week. 09/27/2022: No significant change to the right wound. The left wound measures a little smaller, but still has some depth to it with slough accumulation. 10/04/2022: Nice improvement in both wounds. They are smaller and more superficial. The left wound has better granulation tissue and no longer has the hard fibrous surface. 10/11/2022: Both wounds continue to contract. There is minimal slough and eschar present. Granulation tissue continues to fill in on the left. Edema control is good. 10/21/2022: The right leg wound is about half the size as it was last week. There is a little perimeter eschar and some light slough on the surface. The left leg wound is about the same size, but shallower. There is still slough accumulation but the buds of granulation tissue are getting larger. Edema control remains excellent. 10/30/2022: The right leg wound is healed. The left leg wound is smaller with better granulation tissue and only a little bit of slough present. Edema control is excellent. 11/07/2022: The left leg wound continues to contract and the surface continues to improve. Minimal slough with good granulation tissue filling in. 11/13/2022: Most of the left leg wound has epithelialized. There is a small open area remaining that is about a millimeter in each dimension. There is slough accumulation present. 11/20/2022: There is a little bit of slough on the wound surface. It is about the same size as last week. Edema control is good. 11/27/2022: A tiny opening remains with slough on the surface. Edema control remains excellent. 12/05/2022: A small opening remains underneath some eschar and  slough. 12/12/2022: No difference in the wound on her left leg. Unfortunately, over the weekend, her right leg wound reopened. It is a little over a centimeter in diameter and very superficial with some slough on the surface. Patient History Information obtained from Patient. Family History Cancer - Mother, Diabetes - Father, Hypertension - Mother, Kidney Disease - Mother, Lung Disease - Father, Thyroid Problems - Paternal Grandparents, No family history of Heart Disease, Seizures, Stroke, Tuberculosis. KARRAN, FELIPE ([id number]) [id number] Page 10 of 14 Social History Never smoker, Marital Status - Married, Alcohol Use - Never, Drug Use - No History, Caffeine Use - Daily - coffee. Medical History Eyes Denies history of Cataracts, Glaucoma, Optic Neuritis Ear/Nose/Mouth/Throat Denies history of Chronic  sinus problems/congestion, Middle ear problems Hematologic/Lymphatic Patient has history of Anemia - iron Denies history of Hemophilia, Human Immunodeficiency Virus, Lymphedema, Sickle Cell Disease Respiratory Patient has history of Sleep Apnea - CPAP Denies history of Aspiration, Asthma, Chronic Obstructive Pulmonary Disease (COPD), Pneumothorax, Tuberculosis Cardiovascular Patient has history of Hypertension, Peripheral Venous Disease Denies history of Angina, Arrhythmia, Congestive Heart Failure, Coronary Artery Disease, Deep Vein Thrombosis, Hypotension, Myocardial Infarction, Peripheral Arterial Disease, Phlebitis, Vasculitis Gastrointestinal Denies history of Cirrhosis , Colitis, Crohns, Hepatitis A, Hepatitis B, Hepatitis C Endocrine Denies history of Type I Diabetes, Type II Diabetes Genitourinary Denies history of End Stage Renal Disease Immunological Denies history of Lupus Erythematosus, Raynauds, Scleroderma Integumentary (Skin) Denies history of History of Burn Musculoskeletal Denies history of Gout, Rheumatoid Arthritis, Osteoarthritis,  Osteomyelitis Neurologic Denies history of Dementia, Neuropathy, Quadriplegia, Paraplegia, Seizure Disorder Oncologic Denies history of Received Chemotherapy, Received Radiation Psychiatric Denies history of Anorexia/bulimia, Confinement Anxiety Hospitalization/Surgery History - cholecystectomy 1980s. - nephrolithasis 1980s. - plate and rod in right elbow surgery 2010. 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 Vitals Time Taken: 3:12 PM, Height: 61 in, Weight: 277 lbs, BMI: 52.3, Temperature: 97.9 F, Pulse: 86 bpm, Respiratory Rate: 20 breaths/min, Blood Pressure: 108/74 mmHg. Respiratory Normal work of breathing on room air. General Notes: 12/12/2022: No difference in the wound on her left leg. Unfortunately, over the weekend, her right leg wound reopened. It is a little over a centimeter in diameter and very superficial with some slough on the surface. Integumentary (Hair, Skin) Wound #3 status is Open. Original cause of wound was Trauma. The date acquired was: 06/28/2022. The wound has been in treatment 18 weeks. The wound is located on the Left,Posterior Lower Leg. The wound measures 0.4cm length x 0.2cm width x 0.1cm depth; 0.063cm^2 area and 0.006cm^3 volume. There is Fat Layer (Subcutaneous Tissue) exposed. There is no tunneling or undermining noted. There is a small amount of serous drainage noted. The wound margin is flat and intact. There is no granulation within the wound bed. There is a large (67-100%) amount of necrotic tissue within the wound bed including Adherent Slough. The periwound skin appearance had no abnormalities noted for texture. The periwound skin appearance had no abnormalities noted for moisture. The periwound skin appearance had no abnormalities noted for color. Periwound temperature was noted as No Abnormality. Wound #4 status is Open.  Original cause of wound was Gradually Appeared. The date acquired was: 12/07/2022. The wound is located on the Right,Posterior Lower Leg. The wound measures 0.8cm length x 0.6cm width x 0.1cm depth; 0.377cm^2 area and 0.038cm^3 volume. There is Fat Layer (Subcutaneous Tissue) exposed. There is no tunneling or undermining noted. There is a medium amount of serosanguineous drainage noted. The wound margin is flat and intact. There is large (67-100%) red granulation within the wound bed. There is a small (1-33%) amount of necrotic tissue within the wound bed including Adherent Slough. The periwound skin appearance had no abnormalities noted for texture. The periwound skin appearance had no abnormalities noted for moisture. The periwound skin appearance exhibited: Erythema. The surrounding wound skin color is noted with erythema. Erythema is measured at 1.5 cm. Periwound temperature was noted as No Abnormality. The periwound has tenderness on palpation. 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 There was a Selective/Open Wound Non-Viable Tissue Debridement with a total area of 0.06 sq cm performed by [name], MD. With the following instrument(s): Curette to remove Non-Viable tissue/material. Material removed includes [facility] after achieving pain control using Lidocaine 4% Topical Solution. No specimens were taken. A time out was conducted at 15:40, prior to the start of the procedure. A Minimum amount of bleeding was  controlled with Pressure. The procedure was tolerated well with a pain level of 2 throughout and a pain level of 0 following the procedure. Post Debridement Measurements: 0.4cm length x 0.2cm width x 0.1cm depth; 0.006cm^3 volume. Character of Wound/Ulcer Post Debridement is improved. Severity of Tissue Post Debridement is: Fat layer exposed. Post procedure Diagnosis Wound #3: Same as Pre-Procedure General Notes: Scribed for Dr Lady Gary by [name], RN.. Pre-procedure diagnosis of Wound #3 is a Venous Leg Ulcer located on the Left,Posterior Lower Leg . There was a Double Layer Compression Therapy Procedure by [name], RN. Post procedure Diagnosis Wound #3: Same as Pre-Procedure Notes: urgo lite. Wound #4 Pre-procedure diagnosis of Wound #4 is a Lymphedema located on the Right,Posterior Lower Leg .Severity of Tissue Pre Debridement is: Fat layer exposed. There was a Selective/Open Wound Non-Viable Tissue Debridement with a total area of 0.38 sq cm performed by [name], MD. With the following instrument(s): Curette to remove Non-Viable tissue/material. Material removed includes [facility] after achieving pain control using Lidocaine 4% Topical Solution. No specimens were taken. A time out was conducted at 15:40, prior to the start of the procedure. A Minimum amount of bleeding was controlled with Pressure. The procedure was tolerated well with a pain level of 2 throughout and a pain level of 0 following the procedure. Post Debridement Measurements: 0.8cm length x 0.6cm width x 0.1cm depth; 0.038cm^3 volume. Character of Wound/Ulcer Post Debridement is improved. Severity of Tissue Post Debridement is: Fat layer exposed. Post procedure Diagnosis Wound #4: Same as Pre-Procedure General Notes: Scribed for Dr Lady Gary by [name], RN.. Pre-procedure diagnosis of Wound #4 is a Lymphedema located on the Right,Posterior Lower Leg . There was a Double Layer Compression Therapy  Procedure by [name], RN. Post procedure Diagnosis Wound #4: Same as Pre-Procedure Notes: urgo lite. Plan Follow-up Appointments: Return Appointment in 1 week. - Dr. Lady Gary - room 2 Wed 9/11 @ 3:45 pm Anesthetic: (In clinic) Topical Lidocaine 4% applied to wound bed Bathing/ Shower/ Hygiene: May shower with protection but do not get wound dressing(s) wet. Protect dressing(s) with water repellant cover (for example, large plastic bag) or a cast cover and may then take shower. Edema Control - Lymphedema / SCD / Other: Elevate legs to the level of the heart or above for 30 minutes daily and/or when sitting for 3-4 times a day throughout the day. Avoid standing for long periods of time. Exercise regularly WOUND #3: - Lower Leg Wound Laterality: Left, Posterior Cleanser: Soap and Water 1 x Per Week/ Discharge Instructions: May shower and wash wound with dial antibacterial soap and water prior to dressing change. Cleanser: Wound Cleanser 1 x Per Week/ Discharge Instructions: Cleanse the wound with wound cleanser prior to applying a clean dressing using gauze sponges, not tissue or cotton balls. Topical: Gentamicin 1 x Per Week/ Discharge Instructions:  As directed by physician Topical: Mupirocin Ointment 1 x Per Week/ Discharge Instructions: Apply Mupirocin (Bactroban) as instructed Prim Dressing: Hydrofera Blue Ready Transfer Foam, 2.5x2.5 (in/in) 1 x Per Week/ ary Discharge Instructions: Apply directly to wound bed as directed Secondary Dressing: Woven Gauze Sponges 2x2 in 1 x Per Week/ Discharge Instructions: Apply over primary dressing as directed. Com pression Wrap: Urgo K2 Lite, (equivalent to a 3 layer) two layer compression system, regular 1 x Per Week/ Discharge Instructions: Apply Urgo K2 Lite as directed (alternative to 3 layer compression). WOUND #4: - Lower Leg Wound Laterality: Right, Posterior Cleanser: Soap and Water 1 x Per Week/ Discharge Instructions: May shower  and wash wound with dial antibacterial soap and water prior to dressing change. Cleanser: Wound Cleanser 1 x Per Week/ Discharge Instructions: Cleanse the wound with wound cleanser prior to applying a clean dressing using gauze sponges, not tissue or cotton balls. Topical: Gentamicin 1 x Per Week/ Discharge Instructions: As directed by physician ESTEPHANI, MEWBORN ([id number]) [id number] Page 12 of 14 Topical: Mupirocin Ointment 1 x Per Week/ Discharge Instructions: Apply Mupirocin (Bactroban) as instructed Prim Dressing: Hydrofera Blue Ready Transfer Foam, 2.5x2.5 (in/in) 1 x Per Week/ ary Discharge Instructions: Apply directly to wound bed as directed Secondary Dressing: Woven Gauze Sponges 2x2 in 1 x Per Week/ Discharge Instructions: Apply over primary dressing as directed. Com pression Wrap: Urgo K2 Lite, (equivalent to a 3 layer) two layer compression system, regular 1 x Per Week/ Discharge Instructions: Apply Urgo K2 Lite as directed (alternative to 3 layer compression). 12/12/2022: No difference in the wound on her left leg. Unfortunately, over the weekend, her right leg wound reopened. It is a little over a centimeter in diameter and very superficial with some slough on the surface. I used a curette to debride slough from the reopened right leg wound and slough and eschar from the left leg wound. We will continue the mixture of topical gentamicin and mupirocin on both sites but I am going to change the contact layer to [facility] ready foam. Continue bilateral Urgo lite compression. She would probably benefit from lymphedema pumps and she says she actually has 1 that she got sometime ago when she was living in [name] but it was only for her left leg. We have asked her to bring it to clinic so that we can try to figure out what company provided it and potentially get her bilateral pumps going forward. Follow-up in 1 week. Electronic Signature(s) Signed:  12/12/2022 4:19:30 PM By: [name] MD FACS Entered By: [name] on 12/12/2022 13:19:29 -------------------------------------------------------------------------------- HxROS Details Patient Name: Date of Service: [name], [name] 12/12/2022 3:00 PM Medical Record Number: [id number] Patient Account Number: [id number] Date of Birth/Sex: Treating RN: [birth year] (62 y.o. F) Primary Care Provider: Nira Conn Other Clinician: Referring Provider: Treating Provider/Extender: Andrey Campanile in Treatment: 46 Information Obtained From Patient Eyes Medical History: Negative for: Cataracts; Glaucoma; Optic Neuritis Ear/Nose/Mouth/Throat Medical History: Negative for: Chronic sinus problems/congestion; Middle ear problems Hematologic/Lymphatic Medical History: Positive for: Anemia - iron Negative for: Hemophilia; Human Immunodeficiency Virus; Lymphedema; Sickle Cell Disease Respiratory Medical History: Positive for: Sleep Apnea - CPAP Negative for: Aspiration; Asthma; Chronic Obstructive Pulmonary Disease (COPD); Pneumothorax; Tuberculosis Cardiovascular Medical History: Positive for: Hypertension; Peripheral Venous Disease Negative for: Angina; Arrhythmia; Congestive Heart Failure; Coronary Artery Disease; Deep Vein Thrombosis; Hypotension; Myocardial Infarction; Peripheral Arterial Disease; Phlebitis; Vasculitis Gastrointestinal Medical History: Negative for: Cirrhosis ; Colitis; Crohns; Hepatitis A; Hepatitis B;  LAVETA, KANTER ([id number]) [id number] Page 13 of 14 Endocrine Medical History: Negative for: Type I Diabetes; Type II Diabetes Genitourinary Medical History: Negative for: End Stage Renal Disease Past Medical History Notes: years ago kidney stones Immunological Medical History: Negative for: Lupus Erythematosus; Raynauds; Scleroderma Integumentary (Skin) Medical History: Negative  for: History of Burn Musculoskeletal Medical History: Negative for: Gout; Rheumatoid Arthritis; Osteoarthritis; Osteomyelitis Neurologic Medical History: Negative for: Dementia; Neuropathy; Quadriplegia; Paraplegia; Seizure Disorder Oncologic Medical History: Negative for: Received Chemotherapy; Received Radiation Past Medical History Notes: skin Ca removed from back years ago Psychiatric Medical History: Negative for: Anorexia/bulimia; Confinement Anxiety Immunizations Pneumococcal Vaccine: Received Pneumococcal Vaccination: No Implantable Devices None Hospitalization / Surgery History Type of Hospitalization/Surgery cholecystectomy 1980s nephrolithasis 1980s plate and rod in right elbow surgery 2010 Family and Social History Cancer: Yes - Mother; Diabetes: Yes - Father; Heart Disease: No; Hypertension: Yes - Mother; Kidney Disease: Yes - Mother; Lung Disease: Yes - Father; Seizures: No; Stroke: No; Thyroid Problems: Yes - Paternal Grandparents; Tuberculosis: No; Never smoker; Marital Status - Married; Alcohol Use: Never; Drug Use: No History; Caffeine Use: Daily - coffee; Financial Concerns: No; Food, Clothing or Shelter Needs: No; Support System Lacking: No; Transportation Concerns: No Electronic Signature(s) Signed: 12/12/2022 4:20:36 PM By: [name] MD FACS Entered By: [name] on 12/12/2022 13:17:11 [name] ([id number]) [id number] Page 14 of 14 -------------------------------------------------------------------------------- SuperBill Details Patient Name: Date of Service: [name] 12/12/2022 Medical Record Number: [id number] Patient Account Number: [id number] Date of Birth/Sex: Treating RN: [birth year] (62 y.o. 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 Modifier: Quantity: 1 Physician Procedures : CPT4 Code Description Modifier [id number] - WC PHYS LEVEL 4 - EST PT 25 ICD-10 Diagnosis Description 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

## 2022-12-18 ENCOUNTER — Encounter (HOSPITAL_BASED_OUTPATIENT_CLINIC_OR_DEPARTMENT_OTHER): Payer: BC Managed Care – PPO | Admitting: General Surgery

## 2022-12-18 DIAGNOSIS — L97212 Non-pressure chronic ulcer of right calf with fat layer exposed: Secondary | ICD-10-CM | POA: Diagnosis not present

## 2022-12-18 NOTE — Progress Notes (Addendum)
HAILEIGH, WESTROPE ([id number]) [id number] Page 1 of 10 Visit Report for 12/18/2022 Arrival Information Details Patient Name: Date of Service: [place], [name] 12/18/2022 3:45 PM Medical Record Number: [id number] Patient Account Number: [id number] Date of Birth/Sex: Treating RN: [birth year] (62 y.o. F) Primary Care Larsen Zettel: Nira Conn Other Clinician: Referring Carine Nordgren: Treating Donivin Wirt/Extender: Andrey Campanile in Treatment: 41 Visit Information History Since Last Visit All ordered tests and consults were completed: No Patient Arrived: Ambulatory Added or deleted any medications: No Arrival Time: 15:53 Any new allergies or adverse reactions: No Accompanied By: self Had a fall or experienced change in No Transfer Assistance: None activities of daily living that may affect Patient Identification Verified: Yes risk of falls: Secondary Verification Process Completed: Yes Signs or symptoms of abuse/neglect since last visito No Patient Requires Transmission-Based Precautions: No Hospitalized since last visit: No Patient Has Alerts: No Implantable device outside of the clinic excluding No cellular tissue based products placed in the center since last visit: Pain Present Now: No Electronic Signature(s) Signed: 12/18/2022 4:44:36 PM By: Dayton Scrape Entered By: Dayton Scrape on 12/18/2022 15:53:25 -------------------------------------------------------------------------------- Compression Therapy Details Patient Name: Date of Service: [place], [place]. 12/18/2022 3:45 PM Medical Record Number: [id number] Patient Account Number: [id number] Date of Birth/Sex: Treating RN: October 16, 1960 (62 y.o. [name] Primary Care Rishi Vicario: Nira Conn Other Clinician: Referring Wreatha Sturgeon: Treating Norrine Ballester/Extender: Andrey Campanile in Treatment: 47 Compression Therapy Performed for Wound Assessment:  Wound #3 Left,Posterior Lower Leg Performed By: Clinician Samuella Bruin, RN Compression Type: Double Layer Post Procedure Diagnosis Same as Pre-procedure Electronic Signature(s) Signed: 12/18/2022 4:54:20 PM By: Samuella Bruin Entered By: Samuella Bruin on 12/18/2022 16:19:22 [name] ([id number]) [id number] Page 2 of 10 -------------------------------------------------------------------------------- Compression Therapy Details Patient Name: Date of Service: [name] 12/18/2022 3:45 PM Medical Record Number: [id number] Patient Account Number: [id number] Date of Birth/Sex: Treating RN: 1960-07-23 (62 y.o. [name] Primary Care Papa Piercefield: Nira Conn Other Clinician: Referring Daana Petrasek: Treating Auri Jahnke/Extender: Andrey Campanile in Treatment: 47 Compression Therapy Performed for Wound Assessment: Wound #4 Right,Posterior Lower Leg Performed By: Clinician Samuella Bruin, RN Compression Type: Double Layer Post Procedure Diagnosis Same as Pre-procedure Electronic Signature(s) Signed: 12/18/2022 4:54:20 PM By: Gelene Mink By: Samuella Bruin on 12/18/2022 16:19:22 -------------------------------------------------------------------------------- Encounter Discharge Information Details Patient Name: Date of Service: [address], [name] 12/18/2022 3:45 PM Medical Record Number: [id number] Patient Account Number: [id number] Date of Birth/Sex: Treating RN: May 09, 1960 (62 y.o. [name] Primary Care Kirke Breach: Nira Conn Other Clinician: Referring Vaibhav Fogleman: Treating Marbella Markgraf/Extender: Andrey Campanile in Treatment: 59 Encounter Discharge Information Items Post Procedure Vitals Discharge Condition: Stable Temperature (F): 98 Ambulatory Status: Ambulatory Pulse (bpm): 102 Discharge Destination: Home Respiratory Rate (breaths/min):  18 Transportation: Private Auto Blood Pressure (mmHg): 115/75 Accompanied By: self Schedule Follow-up Appointment: Yes Clinical Summary of Care: Patient Declined Electronic Signature(s) Signed: 12/18/2022 4:54:20 PM By: Samuella Bruin Entered By: Samuella Bruin on 12/18/2022 16:42:21 -------------------------------------------------------------------------------- Lower Extremity Assessment Details Patient Name: Date of Service: [place], [name] 12/18/2022 3:45 PM Medical Record Number: [id number] Patient Account Number: [id number] Date of Birth/Sex: Treating RN: [birth year] (62 y.o. [name] Primary Care Renaud Celli: Nira Conn Other Clinician: Referring Kinte Trim: Treating Taveon Enyeart/Extender: Andrey Campanile in Treatment: 47 Edema Assessment Assessed: [Left: No] Franne Forts: No] Edema: [Left: Yes] [Right: Yes] Calf ANNYE, DURST ([id number]) [phone].pdf Page 3 of 10 Left: Right: Point of  Measurement: From Medial Instep 45 cm 41.3 cm Ankle Left: Right: Point of Measurement: From Medial Instep 23 cm 21.3 cm Vascular Assessment Pulses: Dorsalis Pedis Palpable: [Left:Yes] [Right:Yes] Extremity colors, hair growth, and conditions: Extremity Color: [Left:Hyperpigmented] [Right:Hyperpigmented] Hair Growth on Extremity: [Left:No] [Right:No] Temperature of Extremity: [Left:Warm < 3 seconds] [Right:Warm < 3 seconds] Electronic Signature(s) Signed: 12/18/2022 4:54:20 PM By: Samuella Bruin Entered By: Samuella Bruin on 12/18/2022 16:10:12 -------------------------------------------------------------------------------- Multi Wound Chart Details Patient Name: Date of Service: [name], [name] 12/18/2022 3:45 PM Medical Record Number: [id number] Patient Account Number: [id number] Date of Birth/Sex: Treating RN: October 01, 1960 (62 y.o. F) Primary Care Solomia Harrell: Nira Conn Other Clinician: Referring  Jaclyne Haverstick: Treating Claudetta Sallie/Extender: Andrey Campanile in Treatment: 64 Vital Signs Height(in): 61 Pulse(bpm): 102 Weight(lbs): 277 Blood Pressure(mmHg): 115/75 Body Mass Index(BMI): 52.3 Temperature(F): 98.0 Respiratory Rate(breaths/min): 18 [3:Photos:] [N/A:N/A] Left, Posterior Lower Leg Right, Posterior Lower Leg N/A Wound Location: Trauma Gradually Appeared N/A Wounding Event: Venous Leg Ulcer Lymphedema N/A Primary Etiology: N/A Venous Leg Ulcer N/A Secondary Etiology: Anemia, Sleep Apnea, Hypertension, Anemia, Sleep Apnea, Hypertension, N/A Comorbid History: Peripheral Venous Disease Peripheral Venous Disease 06/28/2022 12/07/2022 N/A Date Acquired: 19 0 N/A Weeks of Treatment: Open Open N/A Wound Status: No No N/A Wound Recurrence: 1.8x1x0.1 1x0.9x0.1 N/A Measurements L x W x D (cm) 1.414 0.707 N/A A (cm) : rea 0.141 0.071 N/A Volume (cm) : -6.60% -87.50% N/A % Reduction in Area: -6.00% -86.80% N/A % Reduction in Volume: Full Thickness Without Exposed Full Thickness Without Exposed N/A Classification: Support Structures Support Structures Small Medium N/A Exudate AmountBURGANDY, FLUELLEN ([id number]) [id number] Page 4 of 10 Serous Serosanguineous N/A Exudate Type: amber red, brown N/A Exudate Color: Flat and Intact Flat and Intact N/A Wound Margin: Large (67-100%) Large (67-100%) N/A Granulation Amount: Red Red N/A Granulation Quality: Small (1-33%) Small (1-33%) N/A Necrotic Amount: Fat Layer (Subcutaneous Tissue): Yes Fat Layer (Subcutaneous Tissue): Yes N/A Exposed Structures: Fascia: No Fascia: No Tendon: No Tendon: No Muscle: No Muscle: No Joint: No Joint: No Bone: No Bone: No Small (1-33%) Small (1-33%) N/A Epithelialization: Debridement - Selective/Open Wound Debridement - Selective/Open Wound N/A Debridement: Pre-procedure Verification/Time Out 16:17 16:17 N/A Taken: Lidocaine  4% Topical Solution Lidocaine 4% Topical Solution N/A Pain Control: Ambulance person, Slough N/A Tissue Debrided: Non-Viable Tissue Non-Viable Tissue N/A Level: 1.41 0.71 N/A Debridement A (sq cm): rea Curette Curette N/A Instrument: Minimum Minimum N/A Bleeding: Pressure Pressure N/A Hemostasis A chieved: Procedure was tolerated well Procedure was tolerated well N/A Debridement Treatment Response: 1.8x1x0.1 1x0.9x0.1 N/A Post Debridement Measurements L x W x D (cm) 0.141 0.071 N/A Post Debridement Volume: (cm) Rash: Yes No Abnormalities Noted N/A Periwound Skin Texture: No Abnormalities Noted No Abnormalities Noted N/A Periwound Skin Moisture: Erythema: No Erythema: Yes N/A Periwound Skin Color: N/A Measured: 1.5cm N/A Erythema Measurement: No Abnormality No Abnormality N/A Temperature: Compression Therapy Compression Therapy N/A Procedures Performed: Debridement Debridement Treatment Notes Wound #3 (Lower Leg) Wound Laterality: Left, Posterior Cleanser Soap and Water Discharge Instruction: May shower and wash wound with dial antibacterial soap and water prior to dressing change. Wound Cleanser Discharge Instruction: Cleanse the wound with wound cleanser prior to applying a clean dressing using gauze sponges, not tissue or cotton balls. Peri-Wound Care Triamcinolone 15 (g) Discharge Instruction: Use triamcinolone 15 (g) as directed Sween Lotion (Moisturizing lotion) Discharge Instruction: Apply moisturizing lotion as directed Topical Gentamicin Discharge Instruction: As directed by physician Mupirocin Ointment Discharge Instruction: Apply Mupirocin (Bactroban) as instructed Primary  Dressing Hydrofera Blue Ready Transfer Foam, 2.5x2.5 (in/in) Discharge Instruction: Apply directly to wound bed as directed Secondary Dressing Woven Gauze Sponges 2x2 in Discharge Instruction: Apply over primary dressing as directed. Secured With Compression Wrap Urgo K2  Lite, (equivalent to a 3 layer) two layer compression system, regular Discharge Instruction: Apply Urgo K2 Lite as directed (alternative to 3 layer compression). Compression Stockings Add-Ons Wound #4 (Lower Leg) Wound Laterality: Right, Posterior MACRINA, ISENBARGER ([id number]) [phone].pdf Page 5 of 10 Soap and Water Discharge Instruction: May shower and wash wound with dial antibacterial soap and water prior to dressing change. Wound Cleanser Discharge Instruction: Cleanse the wound with wound cleanser prior to applying a clean dressing using gauze sponges, not tissue or cotton balls. Peri-Wound Care Triamcinolone 15 (g) Discharge Instruction: Use triamcinolone 15 (g) as directed Sween Lotion (Moisturizing lotion) Discharge Instruction: Apply moisturizing lotion as directed Topical Gentamicin Discharge Instruction: As directed by physician Mupirocin Ointment Discharge Instruction: Apply Mupirocin (Bactroban) as instructed Primary Dressing Hydrofera Blue Ready Transfer Foam, 2.5x2.5 (in/in) Discharge Instruction: Apply directly to wound bed as directed Secondary Dressing Woven Gauze Sponges 2x2 in Discharge Instruction: Apply over primary dressing as directed. Secured With Compression Wrap Urgo K2 Lite, (equivalent to a 3 layer) two layer compression system, regular Discharge Instruction: Apply Urgo K2 Lite as directed (alternative to 3 layer compression). Compression Stockings Add-Ons Electronic Signature(s) Signed: 12/18/2022 5:08:12 PM By: [name] MD FACS Previous Signature: 12/18/2022 5:02:09 PM Version By: [name] MD FACS Entered By: [name] on 12/18/2022 17:08:12 -------------------------------------------------------------------------------- Multi-Disciplinary Care Plan Details Patient Name: Date of Service: [place], [name] 12/18/2022 3:45 PM Medical Record Number: [id number] Patient Account Number:  [id number] Date of Birth/Sex: Treating RN: 1961/03/15 (62 y.o. [name] Primary Care Per Beagley: Nira Conn Other Clinician: Referring Mailin Coglianese: Treating Aryiana Klinkner/Extender: Andrey Campanile in Treatment: 47 Multidisciplinary Care Plan reviewed with physician Active Inactive Venous Leg Ulcer Nursing Diagnoses: Knowledge deficit related to disease process and management Potential for venous Insuffiency (use before diagnosis confirmed) Goals: Patient will maintain optimal edema control Date Initiated: 07/05/2022 Target Resolution Date: 01/03/2023 Goal Status: Active AAJAH, UHDE ([id number]) [id number].pdf Page 6 of 10 Interventions: Assess peripheral edema status every visit. Compression as ordered Provide education on venous insufficiency Treatment Activities: Therapeutic compression applied : 07/05/2022 Notes: Electronic Signature(s) Signed: 12/18/2022 4:54:20 PM By: Samuella Bruin Entered By: Samuella Bruin on 12/18/2022 16:11:30 -------------------------------------------------------------------------------- Pain Assessment Details Patient Name: Date of Service: [name], [name] 12/18/2022 3:45 PM Medical Record Number: [id number] Patient Account Number: [id number] Date of Birth/Sex: Treating RN: [birth year] (62 y.o. F) Primary Care Lawerance Matsuo: Nira Conn Other Clinician: Referring Anaka Beazer: Treating Varnell Orvis/Extender: Andrey Campanile in Treatment: 62 Active Problems Location of Pain Severity and Description of Pain Patient Has Paino Yes Site Locations Rate the pain. Current Pain Level: 4 Worst Pain Level: 10 Least Pain Level: 0 Tolerable Pain Level: 2 Pain Management and Medication Current Pain Management: Electronic Signature(s) Signed: 12/18/2022 4:44:36 PM By: Dayton Scrape Entered By: Dayton Scrape on 12/18/2022  15:54:09 -------------------------------------------------------------------------------- Patient/Caregiver Education Details Patient Name: Date of Service: [name], [name] 9/11/2024andnbsp3:45 PM Medical Record Number: [id number] Patient Account Number: [id number] RONEISHA, URBAIN ([id number]) [id number] Page 7 of 10 Date of Birth/Gender: Treating RN: 08/23/1960 (62 y.o. [name] Primary Care Physician: Nira Conn Other Clinician: Referring Physician: Treating Physician/Extender: Andrey Campanile in Treatment: 35 Education Assessment Education Provided To: Patient Education Topics Provided Wound/Skin  Impairment: Methods: Explain/Verbal Responses: Reinforcements needed, State content correctly Electronic Signature(s) Signed: 12/18/2022 4:54:20 PM By: Samuella Bruin Entered By: Samuella Bruin on 12/18/2022 16:11:43 -------------------------------------------------------------------------------- Wound Assessment Details Patient Name: Date of Service: [name], [name] 12/18/2022 3:45 PM Medical Record Number: [id number] Patient Account Number: [id number] Date of Birth/Sex: Treating RN: September 25, 1960 (62 y.o. F) Primary Care Hal Norrington: Nira Conn Other Clinician: Referring Carlos Quackenbush: Treating Calen Posch/Extender: Andrey Campanile in Treatment: 47 Wound Status Wound Number: 3 Primary Venous Leg Ulcer Etiology: Wound Location: Left, Posterior Lower Leg Wound Status: Open Wounding Event: Trauma Comorbid Anemia, Sleep Apnea, Hypertension, Peripheral Venous Date Acquired: 06/28/2022 History: Disease Weeks Of Treatment: 19 Clustered Wound: No Photos Wound Measurements Length: (cm) 1.8 Width: (cm) 1 Depth: (cm) 0.1 Area: (cm) 1.414 Volume: (cm) 0.141 % Reduction in Area: -6.6% % Reduction in Volume: -6% Epithelialization: Small (1-33%) Tunneling: No Undermining:  No Wound Description Classification: Full Thickness Without Exposed Support Structures Wound Margin: Flat and Intact Exudate Amount: Small Exudate Type: Serous Exudate Color: amber RENEA, PULEIO ([id number]) Foul Odor After Cleansing: No Slough/Fibrino Yes [phone].pdf Page 8 of 10 Wound Bed Granulation Amount: Large (67-100%) Exposed Structure Granulation Quality: Red Fascia Exposed: No Necrotic Amount: Small (1-33%) Fat Layer (Subcutaneous Tissue) Exposed: Yes Necrotic Quality: Adherent Slough Tendon Exposed: No Muscle Exposed: No Joint Exposed: No Bone Exposed: No Periwound Skin Texture Texture Color No Abnormalities Noted: Yes No Abnormalities Noted: Yes Moisture Temperature / Pain No Abnormalities Noted: Yes Temperature: No Abnormality Treatment Notes Wound #3 (Lower Leg) Wound Laterality: Left, Posterior Cleanser Soap and Water Discharge Instruction: May shower and wash wound with dial antibacterial soap and water prior to dressing change. Wound Cleanser Discharge Instruction: Cleanse the wound with wound cleanser prior to applying a clean dressing using gauze sponges, not tissue or cotton balls. Peri-Wound Care Triamcinolone 15 (g) Discharge Instruction: Use triamcinolone 15 (g) as directed Sween Lotion (Moisturizing lotion) Discharge Instruction: Apply moisturizing lotion as directed Topical Gentamicin Discharge Instruction: As directed by physician Mupirocin Ointment Discharge Instruction: Apply Mupirocin (Bactroban) as instructed Primary Dressing Hydrofera Blue Ready Transfer Foam, 2.5x2.5 (in/in) Discharge Instruction: Apply directly to wound bed as directed Secondary Dressing Woven Gauze Sponges 2x2 in Discharge Instruction: Apply over primary dressing as directed. Secured With Compression Wrap Urgo K2 Lite, (equivalent to a 3 layer) two layer compression system, regular Discharge Instruction: Apply Urgo K2 Lite as  directed (alternative to 3 layer compression). Compression Stockings Add-Ons Electronic Signature(s) Signed: 12/18/2022 4:54:20 PM By: Samuella Bruin Entered By: Samuella Bruin on 12/18/2022 16:10:58 -------------------------------------------------------------------------------- Wound Assessment Details Patient Name: Date of Service: [place], [name] 12/18/2022 3:45 PM Medical Record Number: [id number] Patient Account Number: [id number] Date of Birth/Sex: Treating RN: 19-Sep-1960 (62 y.o. Joycelin Mcnatt, Romie Minus ([id number]) [id number] Page 9 of 10 Primary Care Allie Gerhold: Nira Conn Other Clinician: Referring Khadeejah Castner: Treating Ammar Moffatt/Extender: Andrey Campanile in Treatment: 47 Wound Status Wound Number: 4 Primary Etiology: Lymphedema Wound Location: Right, Posterior Lower Leg Secondary Venous Leg Ulcer Etiology: Wounding Event: Gradually Appeared Wound Status: Open Date Acquired: 12/07/2022 Comorbid History: Anemia, Sleep Apnea, Hypertension, Peripheral Venous Weeks Of Treatment: 0 Disease Clustered Wound: No Photos Wound Measurements Length: (cm) 1 Width: (cm) 0.9 Depth: (cm) 0.1 Area: (cm) 0.707 Volume: (cm) 0.071 % Reduction in Area: -87.5% % Reduction in Volume: -86.8% Epithelialization: Small (1-33%) Tunneling: No Undermining: No Wound Description Classification: Full Thickness Without Exposed Support Structures Wound Margin: Flat and Intact Exudate Amount: Medium Exudate Type: Serosanguineous Exudate  Color: red, brown Foul Odor After Cleansing: No Slough/Fibrino Yes Wound Bed Granulation Amount: Large (67-100%) Exposed Structure Granulation Quality: Red Fascia Exposed: No Necrotic Amount: Small (1-33%) Fat Layer (Subcutaneous Tissue) Exposed: Yes Necrotic Quality: Adherent Slough Tendon Exposed: No Muscle Exposed: No Joint Exposed: No Bone Exposed: No Periwound Skin Texture Texture  Color No Abnormalities Noted: Yes No Abnormalities Noted: No Erythema: Yes Moisture Erythema Measurement: Measured No Abnormalities Noted: Yes 1.5 cm Temperature / Pain Temperature: No Abnormality Treatment Notes Wound #4 (Lower Leg) Wound Laterality: Right, Posterior Cleanser Soap and Water Discharge Instruction: May shower and wash wound with dial antibacterial soap and water prior to dressing change. Wound Cleanser Discharge Instruction: Cleanse the wound with wound cleanser prior to applying a clean dressing using gauze sponges, not tissue or cotton balls. Peri-Wound Care Triamcinolone 15 (g) Discharge Instruction: Use triamcinolone 15 (g) as directed Sween Lotion (Moisturizing lotion) SOPHIANNA, FOSNAUGH ([id number]) [id number] Page 10 of 10 Discharge Instruction: Apply moisturizing lotion as directed Topical Gentamicin Discharge Instruction: As directed by physician Mupirocin Ointment Discharge Instruction: Apply Mupirocin (Bactroban) as instructed Primary Dressing Hydrofera Blue Ready Transfer Foam, 2.5x2.5 (in/in) Discharge Instruction: Apply directly to wound bed as directed Secondary Dressing Woven Gauze Sponges 2x2 in Discharge Instruction: Apply over primary dressing as directed. Secured With Compression Wrap Urgo K2 Lite, (equivalent to a 3 layer) two layer compression system, regular Discharge Instruction: Apply Urgo K2 Lite as directed (alternative to 3 layer compression). Compression Stockings Add-Ons Electronic Signature(s) Signed: 12/18/2022 4:54:20 PM By: Samuella Bruin Entered By: Samuella Bruin on 12/18/2022 16:11:15 -------------------------------------------------------------------------------- Vitals Details Patient Name: Date of Service: [name], [name] 12/18/2022 3:45 PM Medical Record Number: [id number] Patient Account Number: [id number] Date of Birth/Sex: Treating RN: July 27, 1960 (62 y.o. F) Primary Care  Dinora Hemm: Nira Conn Other Clinician: Referring Millan Legan: Treating Camisha Srey/Extender: Andrey Campanile in Treatment: 47 Vital Signs Time Taken: 03:53 Temperature (F): 98.0 Height (in): 61 Pulse (bpm): 102 Weight (lbs): 277 Respiratory Rate (breaths/min): 18 Body Mass Index (BMI): 52.3 Blood Pressure (mmHg): 115/75 Reference Range: 80 - 120 mg / dl Electronic Signature(s) Signed: 12/18/2022 4:44:36 PM By: Dayton Scrape Entered By: Dayton Scrape on 12/18/2022 15:53:52

## 2022-12-19 NOTE — Progress Notes (Signed)
[name], [name] ([id number]) [id number] Page 1 of 15 Visit Report for 12/18/2022 Chief Complaint Document Details Patient Name: Date of Service: [name] 12/18/2022 3:45 PM Medical Record Number: [id number] Patient Account Number: [id number] Date of Birth/Sex: Treating RN: 1961-03-21 (62 y.o. F) Primary Care Provider: Nira Conn Other Clinician: Referring Provider: Treating Provider/Extender: Andrey Campanile in Treatment: 41 Information Obtained from: Patient Chief Complaint RLE ulcer Electronic Signature(s) Signed: 12/18/2022 5:04:37 PM By: [name] MD FACS Entered By: [name] on 12/18/2022 17:04:37 -------------------------------------------------------------------------------- Debridement Details Patient Name: Date of Service: [name], [name] 12/18/2022 3:45 PM Medical Record Number: [id number] Patient Account Number: [id number] Date of Birth/Sex: Treating RN: 05-01-1960 (62 y.o. [name] Primary Care Provider: Nira Conn Other Clinician: Referring Provider: Treating Provider/Extender: Andrey Campanile in Treatment: 47 Debridement Performed for Assessment: Wound #3 Left,Posterior Lower Leg Performed By: Physician [name], MD The following information was scribed by: Samuella Bruin The information was scribed for: [name] Debridement Type: Debridement Severity of Tissue Pre Debridement: Fat layer exposed Level of Consciousness (Pre-procedure): Awake and Alert Pre-procedure Verification/Time Out Yes - 16:17 Taken: Start Time: 16:17 Pain Control: Lidocaine 4% T opical Solution Percent of Wound Bed Debrided: 100% T Area Debrided (cm): otal 1.41 Tissue and other material debrided: Non-Viable, Slough, Slough Level: Non-Viable Tissue Debridement Description: Selective/Open Wound Instrument: Curette Bleeding:  Minimum Hemostasis Achieved: Pressure Response to Treatment: Procedure was tolerated well Level of Consciousness (Post- Awake and Alert procedure): Post Debridement Measurements of Total Wound Length: (cm) 1.8 Width: (cm) 1 Depth: (cm) 0.1 Volume: (cm) 0.141 Character of Wound/Ulcer Post Debridement: Improved Severity of Tissue Post Debridement: Fat layer exposed NIL, ANSTETT ([id number]) [id number] Page 2 of 15 Post Procedure Diagnosis Same as Pre-procedure Electronic Signature(s) Signed: 12/18/2022 4:54:20 PM By: Samuella Bruin Signed: 12/18/2022 5:39:35 PM By: [name] MD FACS Entered By: Samuella Bruin on 12/18/2022 16:18:30 -------------------------------------------------------------------------------- Debridement Details Patient Name: Date of Service: [place], [name] 12/18/2022 3:45 PM Medical Record Number: [id number] Patient Account Number: [id number] Date of Birth/Sex: Treating RN: 04/02/1961 (62 y.o. [name] Primary Care Provider: Nira Conn Other Clinician: Referring Provider: Treating Provider/Extender: Andrey Campanile in Treatment: 47 Debridement Performed for Assessment: Wound #4 Right,Posterior Lower Leg Performed By: Physician [name], MD The following information was scribed by: Samuella Bruin The information was scribed for: [name] Debridement Type: Debridement Severity of Tissue Pre Debridement: Fat layer exposed Level of Consciousness (Pre-procedure): Awake and Alert Pre-procedure Verification/Time Out Yes - 16:17 Taken: Start Time: 16:17 Pain Control: Lidocaine 4% Topical Solution Percent of Wound Bed Debrided: 100% T Area Debrided (cm): otal 0.71 Tissue and other material debrided: Non-Viable, Eschar, Slough, Slough Level: Non-Viable Tissue Debridement Description: Selective/Open Wound Instrument: Curette Bleeding:  Minimum Hemostasis Achieved: Pressure Response to Treatment: Procedure was tolerated well Level of Consciousness (Post- Awake and Alert procedure): Post Debridement Measurements of Total Wound Length: (cm) 1 Width: (cm) 0.9 Depth: (cm) 0.1 Volume: (cm) 0.071 Character of Wound/Ulcer Post Debridement: Improved Severity of Tissue Post Debridement: Fat layer exposed Post Procedure Diagnosis Same as Pre-procedure Electronic Signature(s) Signed: 12/18/2022 4:54:20 PM By: Samuella Bruin Signed: 12/18/2022 5:39:35 PM By: [name] MD FACS Entered By: Samuella Bruin on 12/18/2022 16:19:05 HPI Details -------------------------------------------------------------------------------- [name] ([id number]) [id number] Page 3 of 15 Patient Name: Date of Service: [name] 12/18/2022 3:45 PM Medical Record Number: [id number] Patient Account Number: [id number] Date of  Birth/Sex: Treating RN: 1960-08-31 (62 y.o. F) Primary Care Provider: Nira Conn Other Clinician: Referring Provider: Treating Provider/Extender: Andrey Campanile in Treatment: 65 History of Present Illness HPI Description: 02/16/2020 upon evaluation today patient actually appears to be doing poorly in regard to her left medial lower extremity ulcer. This is actually an area that she tells me she has had intermittent issues with over the years although has been closed for some time she typically uses compression right now she has juxta lite compression wraps. With that being said she tells me that this nonetheless open several weeks/months ago and has been given her trouble since. She does have a history of chronic venous insufficiency she is seeing specialist for this in the past she has had an ablation as well as sclerotherapy. With that being said she also has hypertension chronically which is managed by her primary care provider. In general she  seems to be worsening overall with regard to the wound and states that she finally realized that she needed to come in and have somebody look at this and not continue to try to manage this on her own. No fevers, chills, nausea, vomiting, or diarrhea. 02/23/2020 on evaluation today patient appears to be doing well with regard to her wound. This is showing some signs of improvement which is great news still were not quite at the point where I would like to be as far as the overall appearance of the wound is concerned but I do believe this is better than last week. I do believe the Iodoflex is helping as well. 03/08/2020 upon evaluation today patient appears to be doing well with regard to her wound. She has been tolerating the dressing changes without complication. Fortunately I feel like she has made great progress with the Iodoflex but I feel like it may be the point rest to switch to something else possibly a collagen- based dressing at this time. 03/15/2020 upon evaluation today patient appears to be doing excellent in regard to her leg ulcer. She has been tolerating the dressing changes without complication. Fortunately there is no signs of active infection. Overall she is measuring a little bit smaller today which is great news. 03/22/2020 upon evaluation today patient appears to be doing well with regard to her wound. She has been tolerating the dressing changes without complication. Fortunately there is no signs of active infection at this time. 03/28/2020; patient I do not usually see however she has a wound on the left anterior lower leg secondary to chronic venous insufficiency we have been using silver collagen under compression. She arrives in clinic with a nonviable surface requiring debridement 04/12/2020 upon evaluation today patient appears to be doing well all things considered with regard to her leg ulcer. She is tolerating the dressing changes without complication there is minimal dry  skin around the edges of the wound that may be trapping and stopping some of the events of the new skin I am can work on that today. Otherwise the surface of the wound appears to be doing excellent. 04/19/2020 upon evaluation today patient appears to be doing well with regard to her leg ulcer. She has been tolerating dressing changes without complication. Fortunately there is no signs of active infection at this time. No fever chills noted overall very pleased with how things seem to be progressing. 04/26/2020 on evaluation today patient appears to be doing well with regard to her wound currently. Is showing signs of excellent improvement overall is filling in  nicely and there does not appear to be any signs of infection. No fevers, chills, nausea, vomiting, or diarrhea. 05/03/2020 upon evaluation today patient appears to be doing well with regard to her wound on the leg. This overall showing signs of good improvement which is great she has some good epithelial growth and overall I think that things are moving in the correct direction. We likewise going to continue with the wound care measures as before since she seems to making such good improvement. 2/3; venous wound on the left medial leg. This is contracting. We are using Prisma and 3 layer compression. She has a stocking and waiting in the eventuality this heals. She is already using it on the right 05/17/2020 upon evaluation today patient appears to be doing well with regard to her leg ulcer. She has been tolerating the dressing changes without complication. Fortunately there is no signs of active infection which is great news and overall very pleased with where things stand today. No fevers, chills, nausea, vomiting, or diarrhea. 05/24/2020 upon evaluation today patient appears to be doing well with regard to her wound. Overall I feel like she is making excellent progress. There does not appear to be any signs of active infection which is great  news. 05/31/2020 upon evaluation today patient appears to be doing well with regard to her wound. There does not appear to be any signs of active infection which is great news overall I am extremely pleased with where things stand today. READMISSION 01/23/2022 She returns to clinic today with a new wound on her right posterior calf. She says that she was cleaning out an old shed near the middle of August this year and then noticed what seemed to be a bug bite on her right posterior calf. It was itchy, red, and raised. By the end of September, an ulcer had developed. She has been applying various topical creams such as hydrocortisone and others to the site. When it was not improving, she made an appointment in the wound care center. ABI in clinic today was 0.97. On her right posterior calf, there is a circular wound with necrotic fat and black eschar present. There is no purulent drainage or malodor. The periwound skin is in good condition with just a little induration that appears to be secondary to inflammation. 01/31/2022: The wound measures a little bit larger today, but overall is quite a bit cleaner. There is some undermining from 9:00 to 3:00. She is having some periwound itching and says that her wrap slid. 02/08/2022: Despite using an Unna boot first layer at the top of her wrap, it slid again and it looks like there is been some bruising at the wound site. The wound is about the same size in terms of dimensions. There is a fair amount of slough and nonviable tissue still present. Edema control is better than last week. 02/15/2022: The wound dimensions are about the same. There is less nonviable tissue present. The periwound erythema has improved. 02/22/2022: The wound has deteriorated over the past week. It is larger and the periwound is more edematous, erythematous, and indurated. It looks as though there has been more tissue breakdown with undermining present. She is having more pain. There  is a foul odor coming from the wound. 02/26/2022: The wound looks quite a bit better today and the odor is gone. I still do not have her culture data back, but she seems to be responding well to the Augmentin. 03/06/2022: Her wound continues to improve.  There is still some slough accumulation, but the periwound skin is less inflamed. Her culture returned with a polymicrobial population including Pseudomonas and so levofloxacin was also prescribed. She did not understand why a second antibiotic was being added so she has not yet initiated this. 03/14/2022: The wound is about the same, to perhaps a little bit larger. There is a fair amount of slough accumulation on the surface, as well as some hypertrophic granulation tissue. She is still taking levofloxacin, but has completed taking Augmentin. She has her Jodie Echevaria compound with her today. 12/14; the patient has a significant circular wound on the posterior right calf. She is using Keystone and silver alginate under 3 layer compression. Her ABIs MALIEA, HORGER ([id number]) [id number] Page 4 of 15 were within normal limits at 0.97. This may have been traumatic or an insect bite at the start I reviewed these records. 04/04/2022: Since I last saw the wound, it has contracted considerably. There is a fairly thick layer of slough on the surface, as it has not been debrided since the last time I did it. Edema control is excellent and the periwound skin is in much better condition. 04/12/2022: No significant change in the wound dimensions. It is filling with granulation tissue. Still with slough accumulation on the surface. 04/19/2022: The wound is smaller this week. There is some slough accumulation on the surface. 04/26/2022: The wound is smaller again this week and significantly cleaner. The wound surface is a little bit drier than ideal. 05/03/2022: The wound measurements were about the same, but visually it appears smaller. There  is still some undermining at the top of the wound. Moisture balance is better this week. 05/10/2022: The wound measured smaller today. There is still a fair amount of undermining present. Slough has built up on the surface. We are still awaiting snap VAC approval. 05/17/2022: The wound is a little bit smaller today. There is less slough on the surface, but the granulation tissue still is not very robust. She has been approved for snap VAC but will have a 20% coinsurance and it is not clear what that amount would end up being for her. 05/27/2022: The surface of the wound has deteriorated and it is gray and fibrotic. No significant odor, but the drainage on her dressing was a little bit purulent. 05/31/2022: The wound looks quite a bit better today. It is still a little fibrotic but no longer has purulent-looking drainage. The color is better. She spoke with her insurance company and is interested in trying the snap VAC, now that she is aware of the cost to her. 06/07/2022: After 1 week in the snap VAC, there has been substantial improvement to her wound. The undermined portion is closing in. The surface has a healthier color and appearance. There is very minimal slough accumulation. 06/14/2022: The more shallow part of the undermined portion of her wound has closed. There is still some undermining from about 1-2 o'clock. The surface continues to improve. Minimal slough accumulation. 06/28/2022: The wound is shallower this week and the undermining has essentially closed and completely. The surface tissue is improving. 07/05/2022: The depth is almost immeasurable and the surface is nearly flush with the surrounding skin. The undermining has been eliminated. There is light slough on the wound surface. 07/12/2022: There is a little bit of slough on the wound surface. The depth is about the same as last week. 07/19/2022: The wound is essentially flush with the surrounding skin surface. There is slight slough present. No  real change in the AP or transverse dimensions. 07/26/2022: The wound measured a little bit smaller today. It is flush with the surrounding skin surface and there is good granulation tissue present. Minimal slough and biofilm accumulation. 08/02/2022: The right posterior leg wound is a little bit smaller. There is a little slough accumulation. She reported a new wound to [place] today. It is on her left posterior calf. It has been present for about 6 weeks. She is not sure of how it occurred. She has been trying to manage it at home with topical Neosporin and Band-Aids. The fat layer is exposed. The surface is fibrotic and covered with slough. 08/09/2022: The right posterior leg wound is smaller again this week. There is good granulation tissue on the surface with minimal slough accumulation. The left posterior calf wound has built up a thick layer of slough. It is still quite fibrotic underneath the slough, but there is a little bit of a pink color beginning to emerge. Edema control is good. 08/16/2022: Both wounds are smaller this week. There is minimal slough on the right posterior leg wound. There is slough that has reaccumulated on the left posterior calf wound but there is a little bit more granulation tissue emerging. 08/23/2022: Both wounds are about the same size this week, but the quality of the tissue and amount of granulation tissue on the left are both better. 08/30/2022: Both wounds measure smaller today, but there has been some tissue breakdown adjacent to the left leg wound. She says it has been itchy. There is a little bit of slough on the surface of both wounds. 09/06/2022: The right wound measures smaller today. There is minimal slough on the wound surface. The left wound is about the same size. The periwound tissue looks better this week. There is still a layer of fibrinous slough on the left wound. Edema control is good bilaterally. 6/7 the patient has wounds on bilateral calfs. We are using  silver alginate on the right Iodoflex on the left under Urgo lite compression. The wounds are measuring smaller. 09/20/2022: The right wound is flush with the surrounding skin. There is good granulation tissue with some biofilm and thin eschar present. On the left, there is some slough accumulation and the patient says it has been a little itchy this week. 09/27/2022: No significant change to the right wound. The left wound measures a little smaller, but still has some depth to it with slough accumulation. 10/04/2022: Nice improvement in both wounds. They are smaller and more superficial. The left wound has better granulation tissue and no longer has the hard fibrous surface. 10/11/2022: Both wounds continue to contract. There is minimal slough and eschar present. Granulation tissue continues to fill in on the left. Edema control is good. 10/21/2022: The right leg wound is about half the size as it was last week. There is a little perimeter eschar and some light slough on the surface. The left leg wound is about the same size, but shallower. There is still slough accumulation but the buds of granulation tissue are getting larger. Edema control remains excellent. 10/30/2022: The right leg wound is healed. The left leg wound is smaller with better granulation tissue and only a little bit of slough present. Edema control is excellent. 11/07/2022: The left leg wound continues to contract and the surface continues to improve. Minimal slough with good granulation tissue filling in. 11/13/2022: Most of the left leg wound has epithelialized. There is a small open area remaining that is  about a millimeter in each dimension. There is slough accumulation present. 11/20/2022: There is a little bit of slough on the wound surface. It is about the same size as last week. Edema control is good. 11/27/2022: A tiny opening remains with slough on the surface. Edema control remains excellent. MAHKAYLA, HUBBS ([id number])  [id number] Page 5 of 15 12/05/2022: A small opening remains underneath some eschar and slough. 12/12/2022: No difference in the wound on her left leg. Unfortunately, over the weekend, her right leg wound reopened. It is a little over a centimeter in diameter and very superficial with some slough on the surface. 12/18/2022: Both wounds are larger today. There is increased slough on both surfaces, as well. Although edema control seems adequate, she is on her feet quite a bit more due to returning to school and being on her feet much of the day. Electronic Signature(s) Signed: 12/18/2022 5:06:08 PM By: [name] MD FACS Entered By: [name] on 12/18/2022 17:06:08 -------------------------------------------------------------------------------- Physical Exam Details Patient Name: Date of Service: [name], [name] 12/18/2022 3:45 PM Medical Record Number: [id number] Patient Account Number: [id number] Date of Birth/Sex: Treating RN: 1960-09-03 (62 y.o. F) Primary Care Provider: Nira Conn Other Clinician: Referring Provider: Treating Provider/Extender: Andrey Campanile in Treatment: 59 Constitutional . Slightly tachycardic. . . no acute distress. Respiratory Normal work of breathing on room air. Notes 12/18/2022: Both wounds are larger today. There is increased slough on both surfaces, as well. Electronic Signature(s) Signed: 12/18/2022 5:11:13 PM By: [name] MD FACS Entered By: [name] on 12/18/2022 17:11:13 -------------------------------------------------------------------------------- Physician Orders Details Patient Name: Date of Service: [place], [name] 12/18/2022 3:45 PM Medical Record Number: [id number] Patient Account Number: [id number] Date of Birth/Sex: Treating RN: 02-Sep-1960 (62 y.o. [name] Primary Care Provider: Nira Conn Other Clinician: Referring  Provider: Treating Provider/Extender: Andrey Campanile in Treatment: 52 The following information was scribed by: Samuella Bruin The information was scribed for: [name] Verbal / Phone Orders: No Diagnosis Coding ICD-10 Coding Code Description [phone] Non-pressure chronic ulcer of left calf with fat layer exposed L97.212 Non-pressure chronic ulcer of right calf with fat layer exposed I89.0 Lymphedema, not elsewhere classified I87.2 Venous insufficiency (chronic) (peripheral) E66.01 Morbid (severe) obesity due to excess calories I10 Essential (primary) hypertension ELLINE, SOLANO ([id number]) [id number] Page 6 of 15 Follow-up Appointments ppointment in 1 week. - Dr. Lady Gary - room 1 Return A Anesthetic (In clinic) Topical Lidocaine 4% applied to wound bed Bathing/ Shower/ Hygiene May shower with protection but do not get wound dressing(s) wet. Protect dressing(s) with water repellant cover (for example, large plastic bag) or a cast cover and may then take shower. Edema Control - Lymphedema / SCD / Other Elevate legs to the level of the heart or above for 30 minutes daily and/or when sitting for 3-4 times a day throughout the day. Avoid standing for long periods of time. Exercise regularly Wound Treatment Wound #3 - Lower Leg Wound Laterality: Left, Posterior Cleanser: Soap and Water 1 x Per Week Discharge Instructions: May shower and wash wound with dial antibacterial soap and water prior to dressing change. Cleanser: Wound Cleanser 1 x Per Week Discharge Instructions: Cleanse the wound with wound cleanser prior to applying a clean dressing using gauze sponges, not tissue or cotton balls. Peri-Wound Care: Triamcinolone 15 (g) 1 x Per Week Discharge Instructions: Use triamcinolone 15 (g) as directed Peri-Wound Care: Sween Lotion (Moisturizing lotion) 1 x Per Week Discharge  Instructions: Apply moisturizing lotion as  directed Topical: Gentamicin 1 x Per Week Discharge Instructions: As directed by physician Topical: Mupirocin Ointment 1 x Per Week Discharge Instructions: Apply Mupirocin (Bactroban) as instructed Prim Dressing: Hydrofera Blue Ready Transfer Foam, 2.5x2.5 (in/in) 1 x Per Week ary Discharge Instructions: Apply directly to wound bed as directed Secondary Dressing: Woven Gauze Sponges 2x2 in 1 x Per Week Discharge Instructions: Apply over primary dressing as directed. Compression Wrap: Urgo K2 Lite, (equivalent to a 3 layer) two layer compression system, regular 1 x Per Week Discharge Instructions: Apply Urgo K2 Lite as directed (alternative to 3 layer compression). Wound #4 - Lower Leg Wound Laterality: Right, Posterior Cleanser: Soap and Water 1 x Per Week Discharge Instructions: May shower and wash wound with dial antibacterial soap and water prior to dressing change. Cleanser: Wound Cleanser 1 x Per Week Discharge Instructions: Cleanse the wound with wound cleanser prior to applying a clean dressing using gauze sponges, not tissue or cotton balls. Peri-Wound Care: Triamcinolone 15 (g) 1 x Per Week Discharge Instructions: Use triamcinolone 15 (g) as directed Peri-Wound Care: Sween Lotion (Moisturizing lotion) 1 x Per Week Discharge Instructions: Apply moisturizing lotion as directed Topical: Gentamicin 1 x Per Week Discharge Instructions: As directed by physician Topical: Mupirocin Ointment 1 x Per Week Discharge Instructions: Apply Mupirocin (Bactroban) as instructed Prim Dressing: Hydrofera Blue Ready Transfer Foam, 2.5x2.5 (in/in) 1 x Per Week ary Discharge Instructions: Apply directly to wound bed as directed Secondary Dressing: Woven Gauze Sponges 2x2 in 1 x Per Week Discharge Instructions: Apply over primary dressing as directed. Compression Wrap: Urgo K2 Lite, (equivalent to a 3 layer) two layer compression system, regular 1 x Per Week Discharge Instructions: Apply Urgo K2 Lite  as directed (alternative to 3 layer compression). Patient Medications llergies: Iodinated Contrast Media, Sulfa (Sulfonamide Antibiotics) A Notifications Medication Indication [address] SHIMEKA, ACORS ([id number]) [id number] Page 7 of 15 12/18/2022 lidocaine DOSE topical 4 % cream - cream topical Electronic Signature(s) Signed: 12/18/2022 5:39:35 PM By: [name] MD FACS Previous Signature: 12/18/2022 4:54:20 PM Version By: Samuella Bruin Entered By: [name] on 12/18/2022 17:12:01 -------------------------------------------------------------------------------- Problem List Details Patient Name: Date of Service: [name], [name] 12/18/2022 3:45 PM Medical Record Number: [id number] Patient Account Number: [id number] Date of Birth/Sex: Treating RN: 03-20-1961 (62 y.o. F) Primary Care Provider: Nira Conn Other Clinician: Referring Provider: Treating Provider/Extender: Andrey Campanile in Treatment: 59 Active Problems ICD-10 Encounter Code Description Active Date MDM Diagnosis L97.222 Non-pressure chronic ulcer of left calf with fat layer exposed 08/02/2022 No Yes L97.212 Non-pressure chronic ulcer of right calf with fat layer exposed 12/12/2022 No Yes I89.0 Lymphedema, not elsewhere classified 01/23/2022 No Yes I87.2 Venous insufficiency (chronic) (peripheral) 01/23/2022 No Yes E66.01 Morbid (severe) obesity due to excess calories 01/23/2022 No Yes I10 Essential (primary) hypertension 01/23/2022 No Yes Inactive Problems Resolved Problems Electronic Signature(s) Signed: 12/18/2022 5:07:59 PM By: [name] MD FACS Previous Signature: 12/18/2022 5:02:02 PM Version By: [name] MD FACS Entered By: [name] on 12/18/2022 17:07:59 [name] ([id number]) [id number] Page 8 of  15 -------------------------------------------------------------------------------- Progress Note Details Patient Name: Date of Service: [name] 12/18/2022 3:45 PM Medical Record Number: [id number] Patient Account Number: [id number] Date of Birth/Sex: Treating RN: 01/04/1961 (62 y.o. F) Primary Care Provider: Nira Conn Other Clinician: Referring Provider: Treating Provider/Extender: Andrey Campanile in Treatment: 18 Subjective Chief Complaint Information obtained from Patient RLE ulcer History of Present Illness (  HPI) 02/16/2020 upon evaluation today patient actually appears to be doing poorly in regard to her left medial lower extremity ulcer. This is actually an area that she tells me she has had intermittent issues with over the years although has been closed for some time she typically uses compression right now she has juxta lite compression wraps. With that being said she tells me that this nonetheless open several weeks/months ago and has been given her trouble since. She does have a history of chronic venous insufficiency she is seeing specialist for this in the past she has had an ablation as well as sclerotherapy. With that being said she also has hypertension chronically which is managed by her primary care provider. In general she seems to be worsening overall with regard to the wound and states that she finally realized that she needed to come in and have somebody look at this and not continue to try to manage this on her own. No fevers, chills, nausea, vomiting, or diarrhea. 02/23/2020 on evaluation today patient appears to be doing well with regard to her wound. This is showing some signs of improvement which is great news still were not quite at the point where I would like to be as far as the overall appearance of the wound is concerned but I do believe this is better than last week. I do believe the Iodoflex is helping as  well. 03/08/2020 upon evaluation today patient appears to be doing well with regard to her wound. She has been tolerating the dressing changes without complication. Fortunately I feel like she has made great progress with the Iodoflex but I feel like it may be the point rest to switch to something else possibly a collagen- based dressing at this time. 03/15/2020 upon evaluation today patient appears to be doing excellent in regard to her leg ulcer. She has been tolerating the dressing changes without complication. Fortunately there is no signs of active infection. Overall she is measuring a little bit smaller today which is great news. 03/22/2020 upon evaluation today patient appears to be doing well with regard to her wound. She has been tolerating the dressing changes without complication. Fortunately there is no signs of active infection at this time. 03/28/2020; patient I do not usually see however she has a wound on the left anterior lower leg secondary to chronic venous insufficiency we have been using silver collagen under compression. She arrives in clinic with a nonviable surface requiring debridement 04/12/2020 upon evaluation today patient appears to be doing well all things considered with regard to her leg ulcer. She is tolerating the dressing changes without complication there is minimal dry skin around the edges of the wound that may be trapping and stopping some of the events of the new skin I am can work on that today. Otherwise the surface of the wound appears to be doing excellent. 04/19/2020 upon evaluation today patient appears to be doing well with regard to her leg ulcer. She has been tolerating dressing changes without complication. Fortunately there is no signs of active infection at this time. No fever chills noted overall very pleased with how things seem to be progressing. 04/26/2020 on evaluation today patient appears to be doing well with regard to her wound currently. Is  showing signs of excellent improvement overall is filling in nicely and there does not appear to be any signs of infection. No fevers, chills, nausea, vomiting, or diarrhea. 05/03/2020 upon evaluation today patient appears to be doing well with regard  to her wound on the leg. This overall showing signs of good improvement which is great she has some good epithelial growth and overall I think that things are moving in the correct direction. We likewise going to continue with the wound care measures as before since she seems to making such good improvement. 2/3; venous wound on the left medial leg. This is contracting. We are using Prisma and 3 layer compression. She has a stocking and waiting in the eventuality this heals. She is already using it on the right 05/17/2020 upon evaluation today patient appears to be doing well with regard to her leg ulcer. She has been tolerating the dressing changes without complication. Fortunately there is no signs of active infection which is great news and overall very pleased with where things stand today. No fevers, chills, nausea, vomiting, or diarrhea. 05/24/2020 upon evaluation today patient appears to be doing well with regard to her wound. Overall I feel like she is making excellent progress. There does not appear to be any signs of active infection which is great news. 05/31/2020 upon evaluation today patient appears to be doing well with regard to her wound. There does not appear to be any signs of active infection which is great news overall I am extremely pleased with where things stand today. READMISSION 01/23/2022 She returns to clinic today with a new wound on her right posterior calf. She says that she was cleaning out an old shed near the middle of August this year and then noticed what seemed to be a bug bite on her right posterior calf. It was itchy, red, and raised. By the end of September, an ulcer had developed. She has been applying various  topical creams such as hydrocortisone and others to the site. When it was not improving, she made an appointment in the wound care center. ABI in clinic today was 0.97. On her right posterior calf, there is a circular wound with necrotic fat and black eschar present. There is no purulent drainage or malodor. The periwound skin is in good condition with just a little induration that appears to be secondary to inflammation. 01/31/2022: The wound measures a little bit larger today, but overall is quite a bit cleaner. There is some undermining from 9:00 to 3:00. She is having some periwound itching and says that her wrap slid. 02/08/2022: Despite using an Unna boot first layer at the top of her wrap, it slid again and it looks like there is been some bruising at the wound site. The wound is about the same size in terms of dimensions. There is a fair amount of slough and nonviable tissue still present. Edema control is better than last week. 02/15/2022: The wound dimensions are about the same. There is less nonviable tissue present. The periwound erythema has improved. 02/22/2022: The wound has deteriorated over the past week. It is larger and the periwound is more edematous, erythematous, and indurated. It looks as though there has been more tissue breakdown with undermining present. She is having more pain. There is a foul odor coming from the wound. 02/26/2022: The wound looks quite a bit better today and the odor is gone. I still do not have her culture data back, but she seems to be responding well to the Augmentin. SHEILLA, WEISBROT ([id number]) [id number] Page 9 of 15 03/06/2022: Her wound continues to improve. There is still some slough accumulation, but the periwound skin is less inflamed. Her culture returned with a polymicrobial population including Pseudomonas and  so levofloxacin was also prescribed. She did not understand why a second antibiotic was being added  so she has not yet initiated this. 03/14/2022: The wound is about the same, to perhaps a little bit larger. There is a fair amount of slough accumulation on the surface, as well as some hypertrophic granulation tissue. She is still taking levofloxacin, but has completed taking Augmentin. She has her Jodie Echevaria compound with her today. 12/14; the patient has a significant circular wound on the posterior right calf. She is using Keystone and silver alginate under 3 layer compression. Her ABIs were within normal limits at 0.97. This may have been traumatic or an insect bite at the start I reviewed these records. 04/04/2022: Since I last saw the wound, it has contracted considerably. There is a fairly thick layer of slough on the surface, as it has not been debrided since the last time I did it. Edema control is excellent and the periwound skin is in much better condition. 04/12/2022: No significant change in the wound dimensions. It is filling with granulation tissue. Still with slough accumulation on the surface. 04/19/2022: The wound is smaller this week. There is some slough accumulation on the surface. 04/26/2022: The wound is smaller again this week and significantly cleaner. The wound surface is a little bit drier than ideal. 05/03/2022: The wound measurements were about the same, but visually it appears smaller. There is still some undermining at the top of the wound. Moisture balance is better this week. 05/10/2022: The wound measured smaller today. There is still a fair amount of undermining present. Slough has built up on the surface. We are still awaiting snap VAC approval. 05/17/2022: The wound is a little bit smaller today. There is less slough on the surface, but the granulation tissue still is not very robust. She has been approved for snap VAC but will have a 20% coinsurance and it is not clear what that amount would end up being for her. 05/27/2022: The surface of the wound has deteriorated and it  is gray and fibrotic. No significant odor, but the drainage on her dressing was a little bit purulent. 05/31/2022: The wound looks quite a bit better today. It is still a little fibrotic but no longer has purulent-looking drainage. The color is better. She spoke with her insurance company and is interested in trying the snap VAC, now that she is aware of the cost to her. 06/07/2022: After 1 week in the snap VAC, there has been substantial improvement to her wound. The undermined portion is closing in. The surface has a healthier color and appearance. There is very minimal slough accumulation. 06/14/2022: The more shallow part of the undermined portion of her wound has closed. There is still some undermining from about 1-2 o'clock. The surface continues to improve. Minimal slough accumulation. 06/28/2022: The wound is shallower this week and the undermining has essentially closed and completely. The surface tissue is improving. 07/05/2022: The depth is almost immeasurable and the surface is nearly flush with the surrounding skin. The undermining has been eliminated. There is light slough on the wound surface. 07/12/2022: There is a little bit of slough on the wound surface. The depth is about the same as last week. 07/19/2022: The wound is essentially flush with the surrounding skin surface. There is slight slough present. No real change in the AP or transverse dimensions. 07/26/2022: The wound measured a little bit smaller today. It is flush with the surrounding skin surface and there is good granulation tissue  present. Minimal slough and biofilm accumulation. 08/02/2022: The right posterior leg wound is a little bit smaller. There is a little slough accumulation. She reported a new wound to [place] today. It is on her left posterior calf. It has been present for about 6 weeks. She is not sure of how it occurred. She has been trying to manage it at home with topical Neosporin and Band-Aids. The fat layer is exposed.  The surface is fibrotic and covered with slough. 08/09/2022: The right posterior leg wound is smaller again this week. There is good granulation tissue on the surface with minimal slough accumulation. The left posterior calf wound has built up a thick layer of slough. It is still quite fibrotic underneath the slough, but there is a little bit of a pink color beginning to emerge. Edema control is good. 08/16/2022: Both wounds are smaller this week. There is minimal slough on the right posterior leg wound. There is slough that has reaccumulated on the left posterior calf wound but there is a little bit more granulation tissue emerging. 08/23/2022: Both wounds are about the same size this week, but the quality of the tissue and amount of granulation tissue on the left are both better. 08/30/2022: Both wounds measure smaller today, but there has been some tissue breakdown adjacent to the left leg wound. She says it has been itchy. There is a little bit of slough on the surface of both wounds. 09/06/2022: The right wound measures smaller today. There is minimal slough on the wound surface. The left wound is about the same size. The periwound tissue looks better this week. There is still a layer of fibrinous slough on the left wound. Edema control is good bilaterally. 6/7 the patient has wounds on bilateral calfs. We are using silver alginate on the right Iodoflex on the left under Urgo lite compression. The wounds are measuring smaller. 09/20/2022: The right wound is flush with the surrounding skin. There is good granulation tissue with some biofilm and thin eschar present. On the left, there is some slough accumulation and the patient says it has been a little itchy this week. 09/27/2022: No significant change to the right wound. The left wound measures a little smaller, but still has some depth to it with slough accumulation. 10/04/2022: Nice improvement in both wounds. They are smaller and more superficial. The  left wound has better granulation tissue and no longer has the hard fibrous surface. 10/11/2022: Both wounds continue to contract. There is minimal slough and eschar present. Granulation tissue continues to fill in on the left. Edema control is good. 10/21/2022: The right leg wound is about half the size as it was last week. There is a little perimeter eschar and some light slough on the surface. The left leg wound is about the same size, but shallower. There is still slough accumulation but the buds of granulation tissue are getting larger. Edema control remains excellent. 10/30/2022: The right leg wound is healed. The left leg wound is smaller with better granulation tissue and only a little bit of slough present. Edema control is excellent. 11/07/2022: The left leg wound continues to contract and the surface continues to improve. Minimal slough with good granulation tissue filling in. VERNADINE, ASKER ([id number]) [id number] Page 10 of 15 11/13/2022: Most of the left leg wound has epithelialized. There is a small open area remaining that is about a millimeter in each dimension. There is slough accumulation present. 11/20/2022: There is a little bit of slough on the  wound surface. It is about the same size as last week. Edema control is good. 11/27/2022: A tiny opening remains with slough on the surface. Edema control remains excellent. 12/05/2022: A small opening remains underneath some eschar and slough. 12/12/2022: No difference in the wound on her left leg. Unfortunately, over the weekend, her right leg wound reopened. It is a little over a centimeter in diameter and very superficial with some slough on the surface. 12/18/2022: Both wounds are larger today. There is increased slough on both surfaces, as well. Although edema control seems adequate, she is on her feet quite a bit more due to returning to school and being on her feet much of the day. Patient History Information  obtained from Patient. Family History Cancer - Mother, Diabetes - Father, Hypertension - Mother, Kidney Disease - Mother, Lung Disease - Father, Thyroid Problems - Paternal Grandparents, No family history of Heart Disease, Seizures, Stroke, Tuberculosis. Social History Never smoker, Marital Status - Married, Alcohol Use - Never, Drug Use - No History, Caffeine Use - Daily - coffee. Medical History Eyes Denies history of Cataracts, Glaucoma, Optic Neuritis Ear/Nose/Mouth/Throat Denies history of Chronic sinus problems/congestion, Middle ear problems Hematologic/Lymphatic Patient has history of Anemia - iron Denies history of Hemophilia, Human Immunodeficiency Virus, Lymphedema, Sickle Cell Disease Respiratory Patient has history of Sleep Apnea - CPAP Denies history of Aspiration, Asthma, Chronic Obstructive Pulmonary Disease (COPD), Pneumothorax, Tuberculosis Cardiovascular Patient has history of Hypertension, Peripheral Venous Disease Denies history of Angina, Arrhythmia, Congestive Heart Failure, Coronary Artery Disease, Deep Vein Thrombosis, Hypotension, Myocardial Infarction, Peripheral Arterial Disease, Phlebitis, Vasculitis Gastrointestinal Denies history of Cirrhosis , Colitis, Crohns, Hepatitis A, Hepatitis B, Hepatitis C Endocrine Denies history of Type I Diabetes, Type II Diabetes Genitourinary Denies history of End Stage Renal Disease Immunological Denies history of Lupus Erythematosus, Raynauds, Scleroderma Integumentary (Skin) Denies history of History of Burn Musculoskeletal Denies history of Gout, Rheumatoid Arthritis, Osteoarthritis, Osteomyelitis Neurologic Denies history of Dementia, Neuropathy, Quadriplegia, Paraplegia, Seizure Disorder Oncologic Denies history of Received Chemotherapy, Received Radiation Psychiatric Denies history of Anorexia/bulimia, Confinement Anxiety Hospitalization/Surgery History - cholecystectomy 1980s. - nephrolithasis 1980s. - plate  and rod in right elbow surgery 2010. 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 no acute distress. Vitals Time Taken: 3:53 AM, Height: 61 in, Weight: 277 lbs, BMI: 52.3, Temperature: 98.0 F, Pulse: 102 bpm, Respiratory Rate: 18 breaths/min, Blood Pressure: 115/75 mmHg. Respiratory Normal work of breathing on room air. ALDEA, KISHEL ([id number]) [id number] Page 11 of 15 General Notes: 12/18/2022: Both wounds are larger today. There is increased slough on both surfaces, as well. Integumentary (Hair, Skin) Wound #3 status is Open. Original cause of wound was Trauma. The date acquired was: 06/28/2022. The wound has been in treatment 19 weeks. The wound is located on the Left,Posterior Lower Leg. The wound measures 1.8cm length x 1cm width x 0.1cm depth; 1.414cm^2 area and 0.141cm^3 volume. There is Fat Layer (Subcutaneous Tissue) exposed. There is no tunneling or undermining noted. There is a small amount of serous drainage noted. The wound margin is flat and intact. There is large (67-100%) red granulation within the wound bed. There is a small (1-33%) amount of necrotic tissue within the wound bed including Adherent Slough. The periwound skin appearance had no abnormalities noted for texture. The periwound skin appearance had no abnormalities noted for moisture. The periwound skin appearance had no abnormalities noted for color. Periwound temperature  was noted as No Abnormality. Wound #4 status is Open. Original cause of wound was Gradually Appeared. The date acquired was: 12/07/2022. The wound is located on the Right,Posterior Lower Leg. The wound measures 1cm length x 0.9cm width x 0.1cm depth; 0.707cm^2 area and 0.071cm^3 volume. There is Fat Layer (Subcutaneous Tissue) exposed. There is no tunneling or undermining noted. There is a medium  amount of serosanguineous drainage noted. The wound margin is flat and intact. There is large (67-100%) red granulation within the wound bed. There is a small (1-33%) amount of necrotic tissue within the wound bed including Adherent Slough. The periwound skin appearance had no abnormalities noted for texture. The periwound skin appearance had no abnormalities noted for moisture. The periwound skin appearance exhibited: Erythema. The surrounding wound skin color is noted with erythema. Erythema is measured at 1.5 cm. Periwound temperature was noted as No Abnormality. 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 There was a Selective/Open Wound Non-Viable Tissue Debridement with a total area of 1.41 sq cm performed by [name], MD. With the following instrument(s): Curette to remove Non-Viable tissue/material. Material removed includes [facility] after achieving pain control using Lidocaine 4% Topical Solution. No specimens were taken. A time out was conducted at 16:17, prior to the start of the procedure. A Minimum amount of bleeding was controlled with Pressure. The procedure was tolerated well. Post Debridement Measurements: 1.8cm length x 1cm width x 0.1cm depth; 0.141cm^3 volume. Character of Wound/Ulcer Post Debridement is improved. Severity of Tissue Post Debridement is: Fat layer exposed. Post procedure Diagnosis Wound #3: Same as Pre-Procedure Pre-procedure diagnosis of Wound #3 is a Venous Leg Ulcer located on the Left,Posterior Lower  Leg . There was a Double Layer Compression Therapy Procedure by Samuella Bruin, RN. Post procedure Diagnosis Wound #3: Same as Pre-Procedure Wound #4 Pre-procedure diagnosis of Wound #4 is a Lymphedema located on the Right,Posterior Lower Leg .Severity of Tissue Pre Debridement is: Fat layer exposed. There was a Selective/Open Wound Non-Viable Tissue Debridement with a total area of 0.71 sq cm performed by [name], MD. With the following instrument(s): Curette to remove Non-Viable tissue/material. Material removed includes Eschar and Slough and after achieving pain control using Lidocaine 4% Topical Solution. No specimens were taken. A time out was conducted at 16:17, prior to the start of the procedure. A Minimum amount of bleeding was controlled with Pressure. The procedure was tolerated well. Post Debridement Measurements: 1cm length x 0.9cm width x 0.1cm depth; 0.071cm^3 volume. Character of Wound/Ulcer Post Debridement is improved. Severity of Tissue Post Debridement is: Fat layer exposed. Post procedure Diagnosis Wound #4: Same as Pre-Procedure Pre-procedure diagnosis of Wound #4 is a Lymphedema located on the Right,Posterior Lower Leg . There was a Double Layer Compression Therapy Procedure by Samuella Bruin, RN. Post procedure Diagnosis Wound #4: Same as Pre-Procedure Plan Follow-up Appointments: Return Appointment in 1 week. - Dr. Lady Gary - room 1 Anesthetic: (In clinic) Topical Lidocaine 4% applied to wound bed Bathing/ Shower/ Hygiene: May shower with protection but do not get wound dressing(s) wet. Protect dressing(s) with water repellant cover (for example, large plastic bag) or a cast cover  and may then take shower. Edema Control - Lymphedema / SCD / Other: Elevate legs to the level of the heart or above for 30 minutes daily and/or when sitting for 3-4 times a day throughout the day. Avoid standing for long periods of time. Exercise regularly YASAMIN, IFILL  ([id number]) [id number] Page 12 of 15 The following medication(s) was prescribed: lidocaine topical 4 % cream cream topical was prescribed at facility WOUND #3: - Lower Leg Wound Laterality: Left, Posterior Cleanser: Soap and Water 1 x Per Week/ Discharge Instructions: May shower and wash wound with dial antibacterial soap and water prior to dressing change. Cleanser: Wound Cleanser 1 x Per Week/ Discharge Instructions: Cleanse the wound with wound cleanser prior to applying a clean dressing using gauze sponges, not tissue or cotton balls. Peri-Wound Care: Triamcinolone 15 (g) 1 x Per Week/ Discharge Instructions: Use triamcinolone 15 (g) as directed Peri-Wound Care: Sween Lotion (Moisturizing lotion) 1 x Per Week/ Discharge Instructions: Apply moisturizing lotion as directed Topical: Gentamicin 1 x Per Week/ Discharge Instructions: As directed by physician Topical: Mupirocin Ointment 1 x Per Week/ Discharge Instructions: Apply Mupirocin (Bactroban) as instructed Prim Dressing: Hydrofera Blue Ready Transfer Foam, 2.5x2.5 (in/in) 1 x Per Week/ ary Discharge Instructions: Apply directly to wound bed as directed Secondary Dressing: Woven Gauze Sponges 2x2 in 1 x Per Week/ Discharge Instructions: Apply over primary dressing as directed. Com pression Wrap: Urgo K2 Lite, (equivalent to a 3 layer) two layer compression system, regular 1 x Per Week/ Discharge Instructions: Apply Urgo K2 Lite as directed (alternative to 3 layer compression). WOUND #4: - Lower Leg Wound Laterality: Right, Posterior Cleanser: Soap and Water 1 x Per Week/ Discharge Instructions: May shower and wash wound with dial antibacterial soap and water prior to dressing change. Cleanser: Wound Cleanser 1 x Per Week/ Discharge Instructions: Cleanse the wound with wound cleanser prior to applying a clean dressing using gauze sponges, not tissue or cotton balls. Peri-Wound Care: Triamcinolone 15 (g) 1 x  Per Week/ Discharge Instructions: Use triamcinolone 15 (g) as directed Peri-Wound Care: Sween Lotion (Moisturizing lotion) 1 x Per Week/ Discharge Instructions: Apply moisturizing lotion as directed Topical: Gentamicin 1 x Per Week/ Discharge Instructions: As directed by physician Topical: Mupirocin Ointment 1 x Per Week/ Discharge Instructions: Apply Mupirocin (Bactroban) as instructed Prim Dressing: Hydrofera Blue Ready Transfer Foam, 2.5x2.5 (in/in) 1 x Per Week/ ary Discharge Instructions: Apply directly to wound bed as directed Secondary Dressing: Woven Gauze Sponges 2x2 in 1 x Per Week/ Discharge Instructions: Apply over primary dressing as directed. Com pression Wrap: Urgo K2 Lite, (equivalent to a 3 layer) two layer compression system, regular 1 x Per Week/ Discharge Instructions: Apply Urgo K2 Lite as directed (alternative to 3 layer compression). 12/18/2022: Both wounds are larger today. There is increased slough on both surfaces, as well. Although edema control seems adequate, she is on her feet quite a bit more due to returning to school and being on her feet much of the day. I used a curette to debride the slough from her wounds. We will continue the mixture of topical gentamicin and mupirocin along with Hydrofera Blue. I am going to increase her degree of compression from the Urgo lite 3 layer equivalent to regular Urgo 4-layer equivalent to see if this helps, given her increased standing during the day. In addition, given her chronic venous insufficiency and stage II lymphedema and failure of conservative treatments including elevation, exercise, and consistent use of compression wraps, her symptoms persist and  I think she would benefit from daily use of lymphedema pumps. We will work on securing these for her. She will follow-up in 1 week. Electronic Signature(s) Signed: 12/18/2022 5:15:08 PM By: [name] MD FACS Entered By: [name] on 12/18/2022  17:15:08 -------------------------------------------------------------------------------- HxROS Details Patient Name: Date of Service: [name], [name] 12/18/2022 3:45 PM Medical Record Number: [id number] Patient Account Number: [id number] Date of Birth/Sex: Treating RN: 1960-10-20 (62 y.o. F) Primary Care Provider: Nira Conn Other Clinician: Referring Provider: Treating Provider/Extender: Andrey Campanile in Treatment: 25 Information Obtained From Patient Eyes Medical History: Negative for: Cataracts; Glaucoma; Optic Neuritis JANORA, DISKIN ([id number]) [id number] Page 13 of 15 Ear/Nose/Mouth/Throat Medical History: Negative for: Chronic sinus problems/congestion; Middle ear problems Hematologic/Lymphatic Medical History: Positive for: Anemia - iron Negative for: Hemophilia; Human Immunodeficiency Virus; Lymphedema; Sickle Cell Disease Respiratory Medical History: Positive for: Sleep Apnea - CPAP Negative for: Aspiration; Asthma; Chronic Obstructive Pulmonary Disease (COPD); Pneumothorax; Tuberculosis Cardiovascular Medical History: Positive for: Hypertension; Peripheral Venous Disease Negative for: Angina; Arrhythmia; Congestive Heart Failure; Coronary Artery Disease; Deep Vein Thrombosis; Hypotension; Myocardial Infarction; Peripheral Arterial Disease; Phlebitis; Vasculitis Gastrointestinal Medical History: Negative for: Cirrhosis ; Colitis; Crohns; Hepatitis A; Hepatitis B; Hepatitis C Endocrine Medical History: Negative for: Type I Diabetes; Type II Diabetes Genitourinary Medical History: Negative for: End Stage Renal Disease Past Medical History Notes: years ago kidney stones Immunological Medical History: Negative for: Lupus Erythematosus; Raynauds; Scleroderma Integumentary (Skin) Medical History: Negative for: History of Burn Musculoskeletal Medical History: Negative for: Gout; Rheumatoid  Arthritis; Osteoarthritis; Osteomyelitis Neurologic Medical History: Negative for: Dementia; Neuropathy; Quadriplegia; Paraplegia; Seizure Disorder Oncologic Medical History: Negative for: Received Chemotherapy; Received Radiation Past Medical History Notes: skin Ca removed from back years ago Psychiatric Medical History: Negative for: Anorexia/bulimia; Confinement Anxiety Immunizations Pneumococcal Vaccine: Received Pneumococcal Vaccination: No Implantable Devices SYRITA, RADELL ([id number]) [id number] Page 14 of 15 None Hospitalization / Surgery History Type of Hospitalization/Surgery cholecystectomy 1980s nephrolithasis 1980s plate and rod in right elbow surgery 2010 Family and Social History Cancer: Yes - Mother; Diabetes: Yes - Father; Heart Disease: No; Hypertension: Yes - Mother; Kidney Disease: Yes - Mother; Lung Disease: Yes - Father; Seizures: No; Stroke: No; Thyroid Problems: Yes - Paternal Grandparents; Tuberculosis: No; Never smoker; Marital Status - Married; Alcohol Use: Never; Drug Use: No History; Caffeine Use: Daily - coffee; Financial Concerns: No; Food, Clothing or Shelter Needs: No; Support System Lacking: No; Transportation Concerns: No Electronic Signature(s) Signed: 12/18/2022 5:39:35 PM By: [name] MD FACS Entered By: [name] on 12/18/2022 17:08:23 -------------------------------------------------------------------------------- SuperBill Details Patient Name: Date of Service: [name], [name] 12/18/2022 Medical Record Number: [id number] Patient Account Number: [id number] Date of Birth/Sex: Treating RN: March 05, 1961 (62 y.o. 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 I89.0 Lymphedema, not elsewhere classified I87.2 Venous insufficiency (chronic) (peripheral) E66.01 Morbid (severe) obesity due to excess calories I10 Essential (primary) hypertension Facility Procedures : CPT4 Code: [id number] Description: 97597 - DEBRIDE WOUND 1ST 20 SQ CM OR < ICD-10 Diagnosis Description 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 Modifier: Quantity: 1 Physician Procedures : CPT4 Code Description Modifier [id number] - WC PHYS LEVEL 4 -  EST PT 25 ICD-10 Diagnosis Description 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

## 2022-12-21 ENCOUNTER — Other Ambulatory Visit: Payer: Self-pay | Admitting: Family Medicine

## 2022-12-21 DIAGNOSIS — I10 Essential (primary) hypertension: Secondary | ICD-10-CM

## 2022-12-21 DIAGNOSIS — R600 Localized edema: Secondary | ICD-10-CM

## 2022-12-25 ENCOUNTER — Encounter (HOSPITAL_BASED_OUTPATIENT_CLINIC_OR_DEPARTMENT_OTHER): Payer: BC Managed Care – PPO | Admitting: General Surgery

## 2022-12-25 DIAGNOSIS — L97212 Non-pressure chronic ulcer of right calf with fat layer exposed: Secondary | ICD-10-CM | POA: Diagnosis not present

## 2022-12-26 NOTE — Progress Notes (Signed)
YASMIM, GILLOTT ([id number]) [id number] Page 1 of 10 Visit Report for 12/25/2022 Arrival Information Details Patient Name: Date of Service: [name], [name] 12/25/2022 4:00 PM Medical Record Number: [id number] Patient Account Number: [id number] Date of Birth/Sex: Treating RN: 1960/05/03 (62 y.o. Gevena Mart Primary Care Pocahontas Cohenour: Nira Conn Other Clinician: Referring Valen Mascaro: Treating Michai Dieppa/Extender: Andrey Campanile in Treatment: 48 Visit Information History Since Last Visit All ordered tests and consults were completed: Yes Patient Arrived: Ambulatory Added or deleted any medications: No Arrival Time: 16:01 Any new allergies or adverse reactions: No Accompanied By: self Had a fall or experienced change in No Transfer Assistance: None activities of daily living that may affect Patient Identification Verified: Yes risk of falls: Secondary Verification Process Completed: Yes Signs or symptoms of abuse/neglect since last visito No Patient Requires Transmission-Based Precautions: No Hospitalized since last visit: No Patient Has Alerts: No Implantable device outside of the clinic excluding No cellular tissue based products placed in the center since last visit: Has Dressing in Place as Prescribed: Yes Pain Present Now: No Electronic Signature(s) Signed: 12/26/2022 4:20:28 PM By: Brenton Grills Entered By: Brenton Grills on 12/25/2022 16:01:58 -------------------------------------------------------------------------------- Compression Therapy Details Patient Name: Date of Service: [name], [name] 12/25/2022 4:00 PM Medical Record Number: [id number] Patient Account Number: [id number] Date of Birth/Sex: Treating RN: December 23, 1960 (62 y.o. Gevena Mart Primary Care Zoejane Gaulin: Nira Conn Other Clinician: Referring Romelle Muldoon: Treating Vick Filter/Extender: Andrey Campanile in  Treatment: 48 Compression Therapy Performed for Wound Assessment: Wound #3 Left,Posterior Lower Leg Performed By: Clinician Brenton Grills, RN Compression Type: Four Layer Post Procedure Diagnosis Same as Pre-procedure Electronic Signature(s) Signed: 12/26/2022 4:20:28 PM By: Brenton Grills Entered By: Brenton Grills on 12/25/2022 16:28:46 [name] ([id number]) [id number] Page 2 of 10 -------------------------------------------------------------------------------- Compression Therapy Details Patient Name: Date of Service: [name], [name] 12/25/2022 4:00 PM Medical Record Number: [id number] Patient Account Number: [id number] Date of Birth/Sex: Treating RN: [birth year] (62 y.o. Gevena Mart Primary Care Jaynell Castagnola: Nira Conn Other Clinician: Referring Lavonda Thal: Treating Taurus Willis/Extender: Andrey Campanile in Treatment: 48 Compression Therapy Performed for Wound Assessment: Wound #4 Right,Posterior Lower Leg Performed By: Leighton Parody, RN Compression Type: Four Layer Post Procedure Diagnosis Same as Pre-procedure Electronic Signature(s) Signed: 12/26/2022 4:20:28 PM By: Brenton Grills Entered By: Brenton Grills on 12/25/2022 16:28:46 -------------------------------------------------------------------------------- Encounter Discharge Information Details Patient Name: Date of Service: [name] [name] 12/25/2022 4:00 PM Medical Record Number: [id number] Patient Account Number: [id number] Date of Birth/Sex: Treating RN: [birth year] (62 y.o. Gevena Mart Primary Care Jazman Reuter: Nira Conn Other Clinician: Referring Semaj Kham: Treating Charnita Trudel/Extender: Andrey Campanile in Treatment: 48 Encounter Discharge Information Items Post Procedure Vitals Discharge Condition: Stable Temperature (F): 98.3 Ambulatory Status: Ambulatory Pulse (bpm): 82 Discharge Destination:  Home Respiratory Rate (breaths/min): 18 Transportation: Private Auto Blood Pressure (mmHg): 132/78 Accompanied By: self Schedule Follow-up Appointment: Yes Clinical Summary of Care: Patient Declined Electronic Signature(s) Signed: 12/26/2022 4:20:28 PM By: Brenton Grills Entered By: Brenton Grills on 12/25/2022 16:42:55 -------------------------------------------------------------------------------- Lower Extremity Assessment Details Patient Name: Date of Service: [name], [name] 12/25/2022 4:00 PM Medical Record Number: [id number] Patient Account Number: [id number] Date of Birth/Sex: Treating RN: 09-07-1960 (62 y.o. Gevena Mart Primary Care Faelynn Wynder: Nira Conn Other Clinician: Referring Robertine Kipper: Treating [name]/Extender: [name] Weeks in Treatment: 48 Edema Assessment Assessed: [name]: No] Franne Forts: No] Edema: [Left: Yes] [Right: Yes] Calf RHESA, PIRRAGLIA ([id number])  [id number] Page 3 of 10 Left: Right: Point of Measurement: From Medial Instep 45 cm 41.3 cm Ankle Left: Right: Point of Measurement: From Medial Instep 23 cm 21.3 cm Vascular Assessment Pulses: Dorsalis Pedis Palpable: [Left:Yes] [Right:Yes] Extremity colors, hair growth, and conditions: Extremity Color: [Left:Hyperpigmented] [Right:Hyperpigmented] Hair Growth on Extremity: [Left:No] [Right:No] Temperature of Extremity: [Left:Warm < 3 seconds] [Right:Warm < 3 seconds] Toe Nail Assessment Left: Right: Thick: Yes Yes Discolored: Yes Yes Deformed: Yes Yes Improper Length and Hygiene: Yes Yes Electronic Signature(s) Signed: 12/26/2022 4:20:28 PM By: Brenton Grills Entered By: Brenton Grills on 12/25/2022 16:04:58 -------------------------------------------------------------------------------- Multi Wound Chart Details Patient Name: Date of Service: [name], [name] 12/25/2022 4:00 PM Medical Record Number: [id number] Patient Account  Number: [id number] Date of Birth/Sex: Treating RN: 1961/01/18 (63 y.o. F) Primary Care Adelard Sanon: Nira Conn Other Clinician: Referring Fawna Cranmer: Treating Zalen Sequeira/Extender: Andrey Campanile in Treatment: 48 Vital Signs Height(in): 61 Pulse(bpm): 93 Weight(lbs): 277 Blood Pressure(mmHg): 128/84 Body Mass Index(BMI): 52.3 Temperature(F): 98.0 Respiratory Rate(breaths/min): 18 [3:Photos:] [N/A:N/A] Left, Posterior Lower Leg Right, Posterior Lower Leg N/A Wound Location: Trauma Gradually Appeared N/A Wounding Event: Venous Leg Ulcer Lymphedema N/A Primary Etiology: N/A Venous Leg Ulcer N/A Secondary Etiology: Anemia, Sleep Apnea, Hypertension, Anemia, Sleep Apnea, Hypertension, N/A Comorbid History: Peripheral Venous Disease Peripheral Venous Disease 06/28/2022 12/07/2022 N/A Date Acquired: 20 1 N/A Weeks of Treatment: Open Open N/A Wound Status: No No N/A Wound Recurrence: LYNANNE, VANDERWERFF ([id number]) [id number].pdf Page 4 of 10 2x1x0.1 0.9x0.5x0.1 N/A Measurements L x W x D (cm) 1.571 0.353 N/A A (cm) : rea 0.157 0.035 N/A Volume (cm) : -18.40% 6.40% N/A % Reduction in Area: -18.00% 7.90% N/A % Reduction in Volume: Full Thickness Without Exposed Full Thickness Without Exposed N/A Classification: Support Structures Support Structures Small Medium N/A Exudate A mount: Serous Serosanguineous N/A Exudate Type: amber red, brown N/A Exudate Color: Flat and Intact Flat and Intact N/A Wound Margin: Large (67-100%) Large (67-100%) N/A Granulation A mount: Red Red N/A Granulation Quality: Small (1-33%) Small (1-33%) N/A Necrotic A mount: Fat Layer (Subcutaneous Tissue): Yes Fat Layer (Subcutaneous Tissue): Yes N/A Exposed Structures: Fascia: No Fascia: No Tendon: No Tendon: No Muscle: No Muscle: No Joint: No Joint: No Bone: No Bone: No Small (1-33%) Small (1-33%) N/A Epithelialization: Debridement -  Selective/Open Wound Debridement - Selective/Open Wound N/A Debridement: Pre-procedure Verification/Time Out 16:25 16:25 N/A Taken: Lidocaine 4% Topical Solution Lidocaine 4% Topical Solution N/A Pain Control: Ambulance person, [employer] N/A Tissue Debrided: Non-Viable Tissue Non-Viable Tissue N/A Level: 1.57 0.35 N/A Debridement A (sq cm): rea Curette Curette N/A Instrument: Minimum Minimum N/A Bleeding: Pressure Pressure N/A Hemostasis A chieved: Procedure was tolerated well Procedure was tolerated well N/A Debridement Treatment Response: 2x1x0.1 0.9x0.5x0.1 N/A Post Debridement Measurements L x W x D (cm) 0.157 0.035 N/A Post Debridement Volume: (cm) Rash: Yes No Abnormalities Noted N/A Periwound Skin Texture: No Abnormalities Noted No Abnormalities Noted N/A Periwound Skin Moisture: Erythema: No Erythema: Yes N/A Periwound Skin Color: N/A Measured: 1.5cm N/A Erythema Measurement: No Abnormality No Abnormality N/A Temperature: Compression Therapy Compression Therapy N/A Procedures Performed: Debridement Debridement Treatment Notes Wound #3 (Lower Leg) Wound Laterality: Left, Posterior Cleanser Soap and Water Discharge Instruction: May shower and wash wound with dial antibacterial soap and water prior to dressing change. Wound Cleanser Discharge Instruction: Cleanse the wound with wound cleanser prior to applying a clean dressing using gauze sponges, not tissue or cotton balls. Peri-Wound Care Triamcinolone 15 (g) Discharge Instruction: Use triamcinolone 15 (  YASMIM, GILLOTT ([id number]) [id number] Page 1 of 10 Visit Report for 12/25/2022 Arrival Information Details Patient Name: Date of Service: [name], [name] 12/25/2022 4:00 PM Medical Record Number: [id number] Patient Account Number: [id number] Date of Birth/Sex: Treating RN: 1960/05/03 (62 y.o. Gevena Mart Primary Care Pocahontas Cohenour: Nira Conn Other Clinician: Referring Valen Mascaro: Treating Michai Dieppa/Extender: Andrey Campanile in Treatment: 48 Visit Information History Since Last Visit All ordered tests and consults were completed: Yes Patient Arrived: Ambulatory Added or deleted any medications: No Arrival Time: 16:01 Any new allergies or adverse reactions: No Accompanied By: self Had a fall or experienced change in No Transfer Assistance: None activities of daily living that may affect Patient Identification Verified: Yes risk of falls: Secondary Verification Process Completed: Yes Signs or symptoms of abuse/neglect since last visito No Patient Requires Transmission-Based Precautions: No Hospitalized since last visit: No Patient Has Alerts: No Implantable device outside of the clinic excluding No cellular tissue based products placed in the center since last visit: Has Dressing in Place as Prescribed: Yes Pain Present Now: No Electronic Signature(s) Signed: 12/26/2022 4:20:28 PM By: Brenton Grills Entered By: Brenton Grills on 12/25/2022 16:01:58 -------------------------------------------------------------------------------- Compression Therapy Details Patient Name: Date of Service: [name], [name] 12/25/2022 4:00 PM Medical Record Number: [id number] Patient Account Number: [id number] Date of Birth/Sex: Treating RN: December 23, 1960 (62 y.o. Gevena Mart Primary Care Zoejane Gaulin: Nira Conn Other Clinician: Referring Romelle Muldoon: Treating Vick Filter/Extender: Andrey Campanile in  Treatment: 48 Compression Therapy Performed for Wound Assessment: Wound #3 Left,Posterior Lower Leg Performed By: Clinician Brenton Grills, RN Compression Type: Four Layer Post Procedure Diagnosis Same as Pre-procedure Electronic Signature(s) Signed: 12/26/2022 4:20:28 PM By: Brenton Grills Entered By: Brenton Grills on 12/25/2022 16:28:46 [name] ([id number]) [id number] Page 2 of 10 -------------------------------------------------------------------------------- Compression Therapy Details Patient Name: Date of Service: [name], [name] 12/25/2022 4:00 PM Medical Record Number: [id number] Patient Account Number: [id number] Date of Birth/Sex: Treating RN: [birth year] (62 y.o. Gevena Mart Primary Care Jaynell Castagnola: Nira Conn Other Clinician: Referring Lavonda Thal: Treating Taurus Willis/Extender: Andrey Campanile in Treatment: 48 Compression Therapy Performed for Wound Assessment: Wound #4 Right,Posterior Lower Leg Performed By: Leighton Parody, RN Compression Type: Four Layer Post Procedure Diagnosis Same as Pre-procedure Electronic Signature(s) Signed: 12/26/2022 4:20:28 PM By: Brenton Grills Entered By: Brenton Grills on 12/25/2022 16:28:46 -------------------------------------------------------------------------------- Encounter Discharge Information Details Patient Name: Date of Service: [name] [name] 12/25/2022 4:00 PM Medical Record Number: [id number] Patient Account Number: [id number] Date of Birth/Sex: Treating RN: [birth year] (62 y.o. Gevena Mart Primary Care Jazman Reuter: Nira Conn Other Clinician: Referring Semaj Kham: Treating Charnita Trudel/Extender: Andrey Campanile in Treatment: 48 Encounter Discharge Information Items Post Procedure Vitals Discharge Condition: Stable Temperature (F): 98.3 Ambulatory Status: Ambulatory Pulse (bpm): 82 Discharge Destination:  Home Respiratory Rate (breaths/min): 18 Transportation: Private Auto Blood Pressure (mmHg): 132/78 Accompanied By: self Schedule Follow-up Appointment: Yes Clinical Summary of Care: Patient Declined Electronic Signature(s) Signed: 12/26/2022 4:20:28 PM By: Brenton Grills Entered By: Brenton Grills on 12/25/2022 16:42:55 -------------------------------------------------------------------------------- Lower Extremity Assessment Details Patient Name: Date of Service: [name], [name] 12/25/2022 4:00 PM Medical Record Number: [id number] Patient Account Number: [id number] Date of Birth/Sex: Treating RN: 09-07-1960 (62 y.o. Gevena Mart Primary Care Faelynn Wynder: Nira Conn Other Clinician: Referring Robertine Kipper: Treating [name]/Extender: [name] Weeks in Treatment: 48 Edema Assessment Assessed: [name]: No] Franne Forts: No] Edema: [Left: Yes] [Right: Yes] Calf RHESA, PIRRAGLIA ([id number])  Education Topics Provided Wound/Skin Impairment: Methods: Explain/Verbal Responses: State content correctly Electronic Signature(s) Signed: 12/26/2022 4:20:28 PM By: Brenton Grills Entered By: Brenton Grills on 12/25/2022 16:22:39 -------------------------------------------------------------------------------- Wound Assessment Details Patient Name: Date of Service: [name], [name] 12/25/2022 4:00 PM Medical Record Number: [id number] Patient Account Number: [id number] Date of Birth/Sex: Treating RN: [birth year] (62 y.o. Gevena Mart Primary Care Taralyn Ferraiolo: Nira Conn Other Clinician: Referring Jessy Calixte: Treating Abrar Bilton/Extender: Andrey Campanile in Treatment: 48 Wound Status Wound Number: 3 Primary Venous Leg Ulcer Etiology: Wound Location: Left, Posterior Lower Leg Wound Status: Open Wounding Event: Trauma Comorbid Anemia, Sleep Apnea, Hypertension, Peripheral Venous Date Acquired: 06/28/2022 History: Disease Weeks Of Treatment: 20 Clustered Wound: No Photos Wound Measurements Length: (cm) 2 Width: (cm) 1 Caissie, Jameela H ([id number]) Depth: (cm) 0.1 Area: (cm) 1.571 Volume: (cm) 0.157 % Reduction in Area: -18.4% % Reduction in Volume: -18% [id number] Page 8 of 10 Epithelialization: Small (1-33%) Tunneling:  No Undermining: No Wound Description Classification: Full Thickness Without Exposed Support Structures Wound Margin: Flat and Intact Exudate Amount: Small Exudate Type: Serous Exudate Color: amber Foul Odor After Cleansing: No Slough/Fibrino Yes Wound Bed Granulation Amount: Large (67-100%) Exposed Structure Granulation Quality: Red Fascia Exposed: No Necrotic Amount: Small (1-33%) Fat Layer (Subcutaneous Tissue) Exposed: Yes Necrotic Quality: Adherent Slough Tendon Exposed: No Muscle Exposed: No Joint Exposed: No Bone Exposed: No Periwound Skin Texture Texture Color No Abnormalities Noted: Yes No Abnormalities Noted: Yes Moisture Temperature / Pain No Abnormalities Noted: Yes Temperature: No Abnormality Treatment Notes Wound #3 (Lower Leg) Wound Laterality: Left, Posterior Cleanser Soap and Water Discharge Instruction: May shower and wash wound with dial antibacterial soap and water prior to dressing change. Wound Cleanser Discharge Instruction: Cleanse the wound with wound cleanser prior to applying a clean dressing using gauze sponges, not tissue or cotton balls. Peri-Wound Care Triamcinolone 15 (g) Discharge Instruction: Use triamcinolone 15 (g) as directed Sween Lotion (Moisturizing lotion) Discharge Instruction: Apply moisturizing lotion as directed Topical Gentamicin Discharge Instruction: As directed by physician Mupirocin Ointment Discharge Instruction: Apply Mupirocin (Bactroban) as instructed Primary Dressing Hydrofera Blue Ready Transfer Foam, 2.5x2.5 (in/in) Discharge Instruction: Apply directly to wound bed as directed Secondary Dressing Woven Gauze Sponges 2x2 in Discharge Instruction: Apply over primary dressing as directed. Secured With Compression Wrap Urgo K2 Lite, (equivalent to a 3 layer) two layer compression system, regular Discharge Instruction: Apply Urgo K2 Lite as directed (alternative to 3 layer compression). Compression  Stockings Add-Ons Electronic Signature(s) Signed: 12/26/2022 4:20:28 PM By: Brenton Grills Entered By: Brenton Grills on 12/25/2022 16:18:14 [name] ([id number]) [id number] Page 9 of 10 -------------------------------------------------------------------------------- Wound Assessment Details Patient Name: Date of Service: [name], [name] 12/25/2022 4:00 PM Medical Record Number: [id number] Patient Account Number: [id number] Date of Birth/Sex: Treating RN: Nov 05, 1960 (62 y.o. Gevena Mart Primary Care Ossiel Marchio: Nira Conn Other Clinician: Referring Notnamed Croucher: Treating Lamine Laton/Extender: Andrey Campanile in Treatment: 48 Wound Status Wound Number: 4 Primary Etiology: Lymphedema Wound Location: Right, Posterior Lower Leg Secondary Venous Leg Ulcer Etiology: Wounding Event: Gradually Appeared Wound Status: Open Date Acquired: 12/07/2022 Comorbid History: Anemia, Sleep Apnea, Hypertension, Peripheral Venous Weeks Of Treatment: 1 Disease Clustered Wound: No Photos Wound Measurements Length: (cm) 0 Width: (cm) 0 Depth: (cm) 0 Area: (cm) Volume: (cm) .9 % Reduction in Area: 6.4% .5 % Reduction in Volume: 7.9% .1 Epithelialization: Small (1-33%) 0.353 Tunneling: No 0.035 Undermining: No Wound Description Classification: Full Thickness Without Exposed Suppor Wound Margin: Flat and  Education Topics Provided Wound/Skin Impairment: Methods: Explain/Verbal Responses: State content correctly Electronic Signature(s) Signed: 12/26/2022 4:20:28 PM By: Brenton Grills Entered By: Brenton Grills on 12/25/2022 16:22:39 -------------------------------------------------------------------------------- Wound Assessment Details Patient Name: Date of Service: [name], [name] 12/25/2022 4:00 PM Medical Record Number: [id number] Patient Account Number: [id number] Date of Birth/Sex: Treating RN: [birth year] (62 y.o. Gevena Mart Primary Care Taralyn Ferraiolo: Nira Conn Other Clinician: Referring Jessy Calixte: Treating Abrar Bilton/Extender: Andrey Campanile in Treatment: 48 Wound Status Wound Number: 3 Primary Venous Leg Ulcer Etiology: Wound Location: Left, Posterior Lower Leg Wound Status: Open Wounding Event: Trauma Comorbid Anemia, Sleep Apnea, Hypertension, Peripheral Venous Date Acquired: 06/28/2022 History: Disease Weeks Of Treatment: 20 Clustered Wound: No Photos Wound Measurements Length: (cm) 2 Width: (cm) 1 Caissie, Jameela H ([id number]) Depth: (cm) 0.1 Area: (cm) 1.571 Volume: (cm) 0.157 % Reduction in Area: -18.4% % Reduction in Volume: -18% [id number] Page 8 of 10 Epithelialization: Small (1-33%) Tunneling:  No Undermining: No Wound Description Classification: Full Thickness Without Exposed Support Structures Wound Margin: Flat and Intact Exudate Amount: Small Exudate Type: Serous Exudate Color: amber Foul Odor After Cleansing: No Slough/Fibrino Yes Wound Bed Granulation Amount: Large (67-100%) Exposed Structure Granulation Quality: Red Fascia Exposed: No Necrotic Amount: Small (1-33%) Fat Layer (Subcutaneous Tissue) Exposed: Yes Necrotic Quality: Adherent Slough Tendon Exposed: No Muscle Exposed: No Joint Exposed: No Bone Exposed: No Periwound Skin Texture Texture Color No Abnormalities Noted: Yes No Abnormalities Noted: Yes Moisture Temperature / Pain No Abnormalities Noted: Yes Temperature: No Abnormality Treatment Notes Wound #3 (Lower Leg) Wound Laterality: Left, Posterior Cleanser Soap and Water Discharge Instruction: May shower and wash wound with dial antibacterial soap and water prior to dressing change. Wound Cleanser Discharge Instruction: Cleanse the wound with wound cleanser prior to applying a clean dressing using gauze sponges, not tissue or cotton balls. Peri-Wound Care Triamcinolone 15 (g) Discharge Instruction: Use triamcinolone 15 (g) as directed Sween Lotion (Moisturizing lotion) Discharge Instruction: Apply moisturizing lotion as directed Topical Gentamicin Discharge Instruction: As directed by physician Mupirocin Ointment Discharge Instruction: Apply Mupirocin (Bactroban) as instructed Primary Dressing Hydrofera Blue Ready Transfer Foam, 2.5x2.5 (in/in) Discharge Instruction: Apply directly to wound bed as directed Secondary Dressing Woven Gauze Sponges 2x2 in Discharge Instruction: Apply over primary dressing as directed. Secured With Compression Wrap Urgo K2 Lite, (equivalent to a 3 layer) two layer compression system, regular Discharge Instruction: Apply Urgo K2 Lite as directed (alternative to 3 layer compression). Compression  Stockings Add-Ons Electronic Signature(s) Signed: 12/26/2022 4:20:28 PM By: Brenton Grills Entered By: Brenton Grills on 12/25/2022 16:18:14 [name] ([id number]) [id number] Page 9 of 10 -------------------------------------------------------------------------------- Wound Assessment Details Patient Name: Date of Service: [name], [name] 12/25/2022 4:00 PM Medical Record Number: [id number] Patient Account Number: [id number] Date of Birth/Sex: Treating RN: Nov 05, 1960 (62 y.o. Gevena Mart Primary Care Ossiel Marchio: Nira Conn Other Clinician: Referring Notnamed Croucher: Treating Lamine Laton/Extender: Andrey Campanile in Treatment: 48 Wound Status Wound Number: 4 Primary Etiology: Lymphedema Wound Location: Right, Posterior Lower Leg Secondary Venous Leg Ulcer Etiology: Wounding Event: Gradually Appeared Wound Status: Open Date Acquired: 12/07/2022 Comorbid History: Anemia, Sleep Apnea, Hypertension, Peripheral Venous Weeks Of Treatment: 1 Disease Clustered Wound: No Photos Wound Measurements Length: (cm) 0 Width: (cm) 0 Depth: (cm) 0 Area: (cm) Volume: (cm) .9 % Reduction in Area: 6.4% .5 % Reduction in Volume: 7.9% .1 Epithelialization: Small (1-33%) 0.353 Tunneling: No 0.035 Undermining: No Wound Description Classification: Full Thickness Without Exposed Suppor Wound Margin: Flat and

## 2022-12-26 NOTE — Progress Notes (Signed)
is about a millimeter in each dimension. There is slough accumulation present. 11/20/2022: There is a little bit of slough on the wound surface. It is about the same size as last week. Edema control is good. 11/27/2022: A tiny opening remains with slough on the surface. Edema control remains excellent. AUBREYLYNN, KINDEL ([id number])  [id number] Page 5 of 14 12/05/2022: A small opening remains underneath some eschar and slough. 12/12/2022: No difference in the wound on her left leg. Unfortunately, over the weekend, her right leg wound reopened. It is a little over a centimeter in diameter and very superficial with some slough on the surface. 12/18/2022: Both wounds are larger today. There is increased slough on both surfaces, as well. Although edema control seems adequate, she is on her feet quite a bit more due to returning to school and being on her feet much of the day. 12/25/2022: The left calf wound is stable, but the right calf wound is considerably smaller. Both have a little bit of slough present. Edema control is adequate. Electronic Signature(s) Signed: 12/25/2022 4:47:45 PM By: [name] MD FACS Entered By: [name] on 12/25/2022 13:47:45 -------------------------------------------------------------------------------- Physical Exam Details Patient Name: Date of Service: [name], [name] 12/25/2022 4:00 PM Medical Record Number: [id number] Patient Account Number: [id number] Date of Birth/Sex: Treating RN: 26-Oct-1960 (62 y.o. F) Primary Care Provider: Nira Conn Other Clinician: Referring Provider: Treating Provider/Extender: Andrey Campanile in Treatment: 48 Constitutional . . . . no acute distress. Respiratory Normal work of breathing on room air. Notes 12/25/2022: The left calf wound is stable, but the right calf wound is considerably smaller. Both have a little bit of slough present. Edema control is adequate. Electronic Signature(s) Signed: 12/25/2022 4:50:01 PM By: [name] MD FACS Entered By: [name] on 12/25/2022 13:50:01 -------------------------------------------------------------------------------- Physician Orders Details Patient Name: Date of Service: [name], [name] 12/25/2022 4:00 PM Medical Record  Number: [id number] Patient Account Number: [id number] Date of Birth/Sex: Treating RN: 1960-07-14 (62 y.o. Gevena Mart Primary Care Provider: Nira Conn Other Clinician: Referring Provider: Treating Provider/Extender: Andrey Campanile in Treatment: [phone] Verbal / Phone Orders: No Diagnosis Coding Follow-up Appointments ppointment in 1 week. - Dr. Lady Gary - room 1 Return A Anesthetic (In clinic) Topical Lidocaine 4% applied to wound bed Bathing/ Shower/ Hygiene May shower with protection but do not get wound dressing(s) wet. Protect dressing(s) with water repellant cover (for example, large plastic bag) or a cast cover and may then take shower. Edema Control - Lymphedema / SCD / WANIYA, KWONG ([id number]) [id number] Page 6 of 14 Elevate legs to the level of the heart or above for 30 minutes daily and/or when sitting for 3-4 times a day throughout the day. Avoid standing for long periods of time. Exercise regularly Wound Treatment Wound #3 - Lower Leg Wound Laterality: Left, Posterior Cleanser: Soap and Water 1 x Per Week Discharge Instructions: May shower and wash wound with dial antibacterial soap and water prior to dressing change. Cleanser: Wound Cleanser 1 x Per Week Discharge Instructions: Cleanse the wound with wound cleanser prior to applying a clean dressing using gauze sponges, not tissue or cotton balls. Peri-Wound Care: Triamcinolone 15 (g) 1 x Per Week Discharge Instructions: Use triamcinolone 15 (g) as directed Peri-Wound Care: Sween Lotion (Moisturizing lotion) 1 x Per Week Discharge Instructions: Apply moisturizing lotion as directed Topical: Gentamicin 1 x Per Week Discharge Instructions: As directed by physician Topical: Mupirocin Ointment 1 x Per  Week Discharge Instructions: Apply Mupirocin (Bactroban) as instructed Prim Dressing: Hydrofera Blue Ready Transfer Foam, 2.5x2.5 (in/in) 1 x Per  Week ary Discharge Instructions: Apply directly to wound bed as directed Secondary Dressing: Woven Gauze Sponges 2x2 in 1 x Per Week Discharge Instructions: Apply over primary dressing as directed. Compression Wrap: Urgo K2, (equivalent to a 4 layer) two layer compression system, regular 1 x Per Week Discharge Instructions: Apply Urgo K2 as directed (alternative to 4 layer compression). Wound #4 - Lower Leg Wound Laterality: Right, Posterior Cleanser: Soap and Water 1 x Per Week Discharge Instructions: May shower and wash wound with dial antibacterial soap and water prior to dressing change. Cleanser: Wound Cleanser 1 x Per Week Discharge Instructions: Cleanse the wound with wound cleanser prior to applying a clean dressing using gauze sponges, not tissue or cotton balls. Peri-Wound Care: Triamcinolone 15 (g) 1 x Per Week Discharge Instructions: Use triamcinolone 15 (g) as directed Peri-Wound Care: Sween Lotion (Moisturizing lotion) 1 x Per Week Discharge Instructions: Apply moisturizing lotion as directed Topical: Gentamicin 1 x Per Week Discharge Instructions: As directed by physician Topical: Mupirocin Ointment 1 x Per Week Discharge Instructions: Apply Mupirocin (Bactroban) as instructed Prim Dressing: Hydrofera Blue Ready Transfer Foam, 2.5x2.5 (in/in) 1 x Per Week ary Discharge Instructions: Apply directly to wound bed as directed Secondary Dressing: Woven Gauze Sponges 2x2 in 1 x Per Week Discharge Instructions: Apply over primary dressing as directed. Compression Wrap: Urgo K2, (equivalent to a 4 layer) two layer compression system, regular 1 x Per Week Discharge Instructions: Apply Urgo K2 as directed (alternative to 4 layer compression). Electronic Signature(s) Signed: 12/25/2022 5:23:56 PM By: [name] MD FACS Entered By: [name] on 12/25/2022 13:54:02 Problem List  Details -------------------------------------------------------------------------------- [name] ([id number]) [id number] Page 7 of 14 Patient Name: Date of Service: [place], [name] 12/25/2022 4:00 PM Medical Record Number: [id number] Patient Account Number: [id number] Date of Birth/Sex: Treating RN: 12-04-1960 (62 y.o. Gevena Mart Primary Care Provider: Nira Conn Other Clinician: Referring Provider: Treating Provider/Extender: Andrey Campanile in Treatment: 48 Active Problems ICD-10 Encounter Code Description Active Date MDM Diagnosis [phone] Non-pressure chronic ulcer of left calf with fat layer exposed 08/02/2022 No Yes L97.212 Non-pressure chronic ulcer of right calf with fat layer exposed 12/12/2022 No Yes I89.0 Lymphedema, not elsewhere classified 01/23/2022 No Yes I87.2 Venous insufficiency (chronic) (peripheral) 01/23/2022 No Yes E66.01 Morbid (severe) obesity due to excess calories 01/23/2022 No Yes I10 Essential (primary) hypertension 01/23/2022 No Yes Inactive Problems Resolved Problems Electronic Signature(s) Signed: 12/25/2022 4:46:38 PM By: [name] MD FACS Entered By: [name] on 12/25/2022 13:46:38 -------------------------------------------------------------------------------- Progress Note Details Patient Name: Date of Service: [name], [name] 12/25/2022 4:00 PM Medical Record Number: [id number] Patient Account Number: [id number] Date of Birth/Sex: Treating RN: July 30, 1960 (62 y.o. F) Primary Care Provider: Nira Conn Other Clinician: Referring Provider: Treating Provider/Extender: Andrey Campanile in Treatment: 48 Subjective Chief Complaint Information obtained from Patient RLE ulcer History of Present Illness (HPI) 02/16/2020 upon evaluation today patient actually appears to be doing poorly in regard to her left medial lower extremity  ulcer. This is actually an area that she tells me she has had intermittent issues with over the years although has been closed for some time she typically uses compression right now she has juxta lite compression wraps. With that being said she tells me that this nonetheless open several weeks/months ago and has been given her trouble since. She  daily and/or when sitting for 3-4 times a day throughout the day. Avoid standing for long periods of time. Exercise regularly WOUND #3: - Lower Leg Wound Laterality: Left, Posterior Cleanser: Soap and Water 1 x Per Week/ Discharge Instructions: May shower and wash wound with dial antibacterial soap and water prior to dressing change. Cleanser: Wound Cleanser 1 x Per Week/ Discharge Instructions: Cleanse the wound with wound cleanser prior to applying a clean dressing using gauze sponges, not tissue or  cotton balls. Peri-Wound Care: Triamcinolone 15 (g) 1 x Per Week/ Discharge Instructions: Use triamcinolone 15 (g) as directed Peri-Wound Care: Sween Lotion (Moisturizing lotion) 1 x Per Week/ Discharge Instructions: Apply moisturizing lotion as directed Topical: Gentamicin 1 x Per Week/ Discharge Instructions: As directed by physician Topical: Mupirocin Ointment 1 x Per Week/ Discharge Instructions: Apply Mupirocin (Bactroban) as instructed Prim Dressing: Hydrofera Blue Ready Transfer Foam, 2.5x2.5 (in/in) 1 x Per Week/ ary Discharge Instructions: Apply directly to wound bed as directed Secondary Dressing: Woven Gauze Sponges 2x2 in 1 x Per Week/ Discharge Instructions: Apply over primary dressing as directed. Com pression Wrap: Urgo K2, (equivalent to a 4 layer) two layer compression system, regular 1 x Per Week/ Discharge Instructions: Apply Urgo K2 as directed (alternative to 4 layer compression). WOUND #4: - Lower Leg Wound Laterality: Right, Posterior Cleanser: Soap and Water 1 x Per Week/ Discharge Instructions: May shower and wash wound with dial antibacterial soap and water prior to dressing change. Cleanser: Wound Cleanser 1 x Per IRISSA, FOGLIA ([id number]) [id number] Page 12 of 14 Discharge Instructions: Cleanse the wound with wound cleanser prior to applying a clean dressing using gauze sponges, not tissue or cotton balls. Peri-Wound Care: Triamcinolone 15 (g) 1 x Per Week/ Discharge Instructions: Use triamcinolone 15 (g) as directed Peri-Wound Care: Sween Lotion (Moisturizing lotion) 1 x Per Week/ Discharge Instructions: Apply moisturizing lotion as directed Topical: Gentamicin 1 x Per Week/ Discharge Instructions: As directed by physician Topical: Mupirocin Ointment 1 x Per Week/ Discharge Instructions: Apply Mupirocin (Bactroban) as instructed Prim Dressing: Hydrofera Blue Ready Transfer Foam, 2.5x2.5 (in/in) 1 x Per  Week/ ary Discharge Instructions: Apply directly to wound bed as directed Secondary Dressing: Woven Gauze Sponges 2x2 in 1 x Per Week/ Discharge Instructions: Apply over primary dressing as directed. Com pression Wrap: Urgo K2, (equivalent to a 4 layer) two layer compression system, regular 1 x Per Week/ Discharge Instructions: Apply Urgo K2 as directed (alternative to 4 layer compression). 12/25/2022: The left calf wound is stable, but the right calf wound is considerably smaller. Both have a little bit of slough present. Edema control is adequate. I used a curette to debride slough from both of her wounds. We will continue the mixture of topical gentamicin and mupirocin with Hydrofera Blue and Urgo 4- layer compression equivalent wraps bilaterally. Follow-up in 1 week. Electronic Signature(s) Signed: 12/25/2022 4:56:20 PM By: [name] MD FACS Entered By: [name] on 12/25/2022 13:56:20 -------------------------------------------------------------------------------- HxROS Details Patient Name: Date of Service: [name], [name] 12/25/2022 4:00 PM Medical Record Number: [id number] Patient Account Number: [id number] Date of Birth/Sex: Treating RN: 19-Nov-1960 (62 y.o. F) Primary Care Provider: Nira Conn Other Clinician: Referring Provider: Treating Provider/Extender: Andrey Campanile in Treatment: 48 Information Obtained From Patient Eyes Medical History: Negative for: Cataracts; Glaucoma; Optic Neuritis Ear/Nose/Mouth/Throat Medical History: Negative for: Chronic sinus problems/congestion; Middle ear problems Hematologic/Lymphatic Medical History: Positive for: Anemia - iron Negative for: Hemophilia; Human Immunodeficiency Virus; Lymphedema; Sickle Cell Disease  Respiratory Medical History: Positive for: Sleep Apnea - CPAP Negative for: Aspiration; Asthma; Chronic Obstructive Pulmonary Disease (COPD); Pneumothorax;  Tuberculosis Cardiovascular Medical History: Positive for: Hypertension; Peripheral Venous Disease Negative for: Angina; Arrhythmia; Congestive Heart Failure; Coronary Artery Disease; Deep Vein Thrombosis; Hypotension; Myocardial Infarction; Peripheral Arterial Disease; Phlebitis; Vasculitis Gastrointestinal COREN, HUSAIN ([id number]) [phone].pdf Page 13 of 14 Medical History: Negative for: Cirrhosis ; Colitis; Crohns; Hepatitis A; Hepatitis B; Hepatitis C Endocrine Medical History: Negative for: Type I Diabetes; Type II Diabetes Genitourinary Medical History: Negative for: End Stage Renal Disease Past Medical History Notes: years ago kidney stones Immunological Medical History: Negative for: Lupus Erythematosus; Raynauds; Scleroderma Integumentary (Skin) Medical History: Negative for: History of Burn Musculoskeletal Medical History: Negative for: Gout; Rheumatoid Arthritis; Osteoarthritis; Osteomyelitis Neurologic Medical History: Negative for: Dementia; Neuropathy; Quadriplegia; Paraplegia; Seizure Disorder Oncologic Medical History: Negative for: Received Chemotherapy; Received Radiation Past Medical History Notes: skin Ca removed from back years ago Psychiatric Medical History: Negative for: Anorexia/bulimia; Confinement Anxiety Immunizations Pneumococcal Vaccine: Received Pneumococcal Vaccination: No Implantable Devices None Hospitalization / Surgery History Type of Hospitalization/Surgery cholecystectomy 1980s nephrolithasis 1980s plate and rod in right elbow surgery 2010 Family and Social History Cancer: Yes - Mother; Diabetes: Yes - Father; Heart Disease: No; Hypertension: Yes - Mother; Kidney Disease: Yes - Mother; Lung Disease: Yes - Father; Seizures: No; Stroke: No; Thyroid Problems: Yes - Paternal Grandparents; Tuberculosis: No; Never smoker; Marital Status - Married; Alcohol Use: Never; Drug Use: No History; Caffeine Use:  Daily - coffee; Financial Concerns: No; Food, Clothing or Shelter Needs: No; Support System Lacking: No; Transportation Concerns: No Electronic Signature(s) Signed: 12/25/2022 5:23:56 PM By: [name] MD FACS Entered By: [name] on 12/25/2022 13:49:26 [name] ([id number]) [id number] Page 14 of 14 -------------------------------------------------------------------------------- SuperBill Details Patient Name: Date of Service: [name] 12/25/2022 Medical Record Number: [id number] Patient Account Number: [id number] Date of Birth/Sex: Treating RN: Oct 19, 1960 (62 y.o. 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 I89.0 Lymphedema, not elsewhere classified I87.2 Venous insufficiency (chronic) (peripheral) E66.01 Morbid (severe) obesity due to excess calories I10 Essential (primary) hypertension Facility Procedures : CPT4 Code: [id number] Description: 97597 - DEBRIDE WOUND 1ST 20 SQ CM OR < ICD-10 Diagnosis Description 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 Modifier: Quantity: 1 Physician Procedures : CPT4 Code Description Modifier [id number] 99214 - WC PHYS LEVEL 4 - EST PT 25 ICD-10 Diagnosis Description 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 There is slough accumulation present. 11/20/2022: There is a little bit of slough on the wound surface. It is about the same size as last week. Edema control is good. 11/27/2022: A tiny opening remains with slough on the surface. Edema control remains excellent. AUBREYLYNN, KINDEL ([id number])  [id number] Page 5 of 14 12/05/2022: A small opening remains underneath some eschar and slough. 12/12/2022: No difference in the wound on her left leg. Unfortunately, over the weekend, her right leg wound reopened. It is a little over a centimeter in diameter and very superficial with some slough on the surface. 12/18/2022: Both wounds are larger today. There is increased slough on both surfaces, as well. Although edema control seems adequate, she is on her feet quite a bit more due to returning to school and being on her feet much of the day. 12/25/2022: The left calf wound is stable, but the right calf wound is considerably smaller. Both have a little bit of slough present. Edema control is adequate. Electronic Signature(s) Signed: 12/25/2022 4:47:45 PM By: [name] MD FACS Entered By: [name] on 12/25/2022 13:47:45 -------------------------------------------------------------------------------- Physical Exam Details Patient Name: Date of Service: [name], [name] 12/25/2022 4:00 PM Medical Record Number: [id number] Patient Account Number: [id number] Date of Birth/Sex: Treating RN: 26-Oct-1960 (62 y.o. F) Primary Care Provider: Nira Conn Other Clinician: Referring Provider: Treating Provider/Extender: Andrey Campanile in Treatment: 48 Constitutional . . . . no acute distress. Respiratory Normal work of breathing on room air. Notes 12/25/2022: The left calf wound is stable, but the right calf wound is considerably smaller. Both have a little bit of slough present. Edema control is adequate. Electronic Signature(s) Signed: 12/25/2022 4:50:01 PM By: [name] MD FACS Entered By: [name] on 12/25/2022 13:50:01 -------------------------------------------------------------------------------- Physician Orders Details Patient Name: Date of Service: [name], [name] 12/25/2022 4:00 PM Medical Record  Number: [id number] Patient Account Number: [id number] Date of Birth/Sex: Treating RN: 1960-07-14 (62 y.o. Gevena Mart Primary Care Provider: Nira Conn Other Clinician: Referring Provider: Treating Provider/Extender: Andrey Campanile in Treatment: [phone] Verbal / Phone Orders: No Diagnosis Coding Follow-up Appointments ppointment in 1 week. - Dr. Lady Gary - room 1 Return A Anesthetic (In clinic) Topical Lidocaine 4% applied to wound bed Bathing/ Shower/ Hygiene May shower with protection but do not get wound dressing(s) wet. Protect dressing(s) with water repellant cover (for example, large plastic bag) or a cast cover and may then take shower. Edema Control - Lymphedema / SCD / WANIYA, KWONG ([id number]) [id number] Page 6 of 14 Elevate legs to the level of the heart or above for 30 minutes daily and/or when sitting for 3-4 times a day throughout the day. Avoid standing for long periods of time. Exercise regularly Wound Treatment Wound #3 - Lower Leg Wound Laterality: Left, Posterior Cleanser: Soap and Water 1 x Per Week Discharge Instructions: May shower and wash wound with dial antibacterial soap and water prior to dressing change. Cleanser: Wound Cleanser 1 x Per Week Discharge Instructions: Cleanse the wound with wound cleanser prior to applying a clean dressing using gauze sponges, not tissue or cotton balls. Peri-Wound Care: Triamcinolone 15 (g) 1 x Per Week Discharge Instructions: Use triamcinolone 15 (g) as directed Peri-Wound Care: Sween Lotion (Moisturizing lotion) 1 x Per Week Discharge Instructions: Apply moisturizing lotion as directed Topical: Gentamicin 1 x Per Week Discharge Instructions: As directed by physician Topical: Mupirocin Ointment 1 x Per  daily and/or when sitting for 3-4 times a day throughout the day. Avoid standing for long periods of time. Exercise regularly WOUND #3: - Lower Leg Wound Laterality: Left, Posterior Cleanser: Soap and Water 1 x Per Week/ Discharge Instructions: May shower and wash wound with dial antibacterial soap and water prior to dressing change. Cleanser: Wound Cleanser 1 x Per Week/ Discharge Instructions: Cleanse the wound with wound cleanser prior to applying a clean dressing using gauze sponges, not tissue or  cotton balls. Peri-Wound Care: Triamcinolone 15 (g) 1 x Per Week/ Discharge Instructions: Use triamcinolone 15 (g) as directed Peri-Wound Care: Sween Lotion (Moisturizing lotion) 1 x Per Week/ Discharge Instructions: Apply moisturizing lotion as directed Topical: Gentamicin 1 x Per Week/ Discharge Instructions: As directed by physician Topical: Mupirocin Ointment 1 x Per Week/ Discharge Instructions: Apply Mupirocin (Bactroban) as instructed Prim Dressing: Hydrofera Blue Ready Transfer Foam, 2.5x2.5 (in/in) 1 x Per Week/ ary Discharge Instructions: Apply directly to wound bed as directed Secondary Dressing: Woven Gauze Sponges 2x2 in 1 x Per Week/ Discharge Instructions: Apply over primary dressing as directed. Com pression Wrap: Urgo K2, (equivalent to a 4 layer) two layer compression system, regular 1 x Per Week/ Discharge Instructions: Apply Urgo K2 as directed (alternative to 4 layer compression). WOUND #4: - Lower Leg Wound Laterality: Right, Posterior Cleanser: Soap and Water 1 x Per Week/ Discharge Instructions: May shower and wash wound with dial antibacterial soap and water prior to dressing change. Cleanser: Wound Cleanser 1 x Per IRISSA, FOGLIA ([id number]) [id number] Page 12 of 14 Discharge Instructions: Cleanse the wound with wound cleanser prior to applying a clean dressing using gauze sponges, not tissue or cotton balls. Peri-Wound Care: Triamcinolone 15 (g) 1 x Per Week/ Discharge Instructions: Use triamcinolone 15 (g) as directed Peri-Wound Care: Sween Lotion (Moisturizing lotion) 1 x Per Week/ Discharge Instructions: Apply moisturizing lotion as directed Topical: Gentamicin 1 x Per Week/ Discharge Instructions: As directed by physician Topical: Mupirocin Ointment 1 x Per Week/ Discharge Instructions: Apply Mupirocin (Bactroban) as instructed Prim Dressing: Hydrofera Blue Ready Transfer Foam, 2.5x2.5 (in/in) 1 x Per  Week/ ary Discharge Instructions: Apply directly to wound bed as directed Secondary Dressing: Woven Gauze Sponges 2x2 in 1 x Per Week/ Discharge Instructions: Apply over primary dressing as directed. Com pression Wrap: Urgo K2, (equivalent to a 4 layer) two layer compression system, regular 1 x Per Week/ Discharge Instructions: Apply Urgo K2 as directed (alternative to 4 layer compression). 12/25/2022: The left calf wound is stable, but the right calf wound is considerably smaller. Both have a little bit of slough present. Edema control is adequate. I used a curette to debride slough from both of her wounds. We will continue the mixture of topical gentamicin and mupirocin with Hydrofera Blue and Urgo 4- layer compression equivalent wraps bilaterally. Follow-up in 1 week. Electronic Signature(s) Signed: 12/25/2022 4:56:20 PM By: [name] MD FACS Entered By: [name] on 12/25/2022 13:56:20 -------------------------------------------------------------------------------- HxROS Details Patient Name: Date of Service: [name], [name] 12/25/2022 4:00 PM Medical Record Number: [id number] Patient Account Number: [id number] Date of Birth/Sex: Treating RN: 19-Nov-1960 (62 y.o. F) Primary Care Provider: Nira Conn Other Clinician: Referring Provider: Treating Provider/Extender: Andrey Campanile in Treatment: 48 Information Obtained From Patient Eyes Medical History: Negative for: Cataracts; Glaucoma; Optic Neuritis Ear/Nose/Mouth/Throat Medical History: Negative for: Chronic sinus problems/congestion; Middle ear problems Hematologic/Lymphatic Medical History: Positive for: Anemia - iron Negative for: Hemophilia; Human Immunodeficiency Virus; Lymphedema; Sickle Cell Disease  Respiratory Medical History: Positive for: Sleep Apnea - CPAP Negative for: Aspiration; Asthma; Chronic Obstructive Pulmonary Disease (COPD); Pneumothorax;  Tuberculosis Cardiovascular Medical History: Positive for: Hypertension; Peripheral Venous Disease Negative for: Angina; Arrhythmia; Congestive Heart Failure; Coronary Artery Disease; Deep Vein Thrombosis; Hypotension; Myocardial Infarction; Peripheral Arterial Disease; Phlebitis; Vasculitis Gastrointestinal COREN, HUSAIN ([id number]) [phone].pdf Page 13 of 14 Medical History: Negative for: Cirrhosis ; Colitis; Crohns; Hepatitis A; Hepatitis B; Hepatitis C Endocrine Medical History: Negative for: Type I Diabetes; Type II Diabetes Genitourinary Medical History: Negative for: End Stage Renal Disease Past Medical History Notes: years ago kidney stones Immunological Medical History: Negative for: Lupus Erythematosus; Raynauds; Scleroderma Integumentary (Skin) Medical History: Negative for: History of Burn Musculoskeletal Medical History: Negative for: Gout; Rheumatoid Arthritis; Osteoarthritis; Osteomyelitis Neurologic Medical History: Negative for: Dementia; Neuropathy; Quadriplegia; Paraplegia; Seizure Disorder Oncologic Medical History: Negative for: Received Chemotherapy; Received Radiation Past Medical History Notes: skin Ca removed from back years ago Psychiatric Medical History: Negative for: Anorexia/bulimia; Confinement Anxiety Immunizations Pneumococcal Vaccine: Received Pneumococcal Vaccination: No Implantable Devices None Hospitalization / Surgery History Type of Hospitalization/Surgery cholecystectomy 1980s nephrolithasis 1980s plate and rod in right elbow surgery 2010 Family and Social History Cancer: Yes - Mother; Diabetes: Yes - Father; Heart Disease: No; Hypertension: Yes - Mother; Kidney Disease: Yes - Mother; Lung Disease: Yes - Father; Seizures: No; Stroke: No; Thyroid Problems: Yes - Paternal Grandparents; Tuberculosis: No; Never smoker; Marital Status - Married; Alcohol Use: Never; Drug Use: No History; Caffeine Use:  Daily - coffee; Financial Concerns: No; Food, Clothing or Shelter Needs: No; Support System Lacking: No; Transportation Concerns: No Electronic Signature(s) Signed: 12/25/2022 5:23:56 PM By: [name] MD FACS Entered By: [name] on 12/25/2022 13:49:26 [name] ([id number]) [id number] Page 14 of 14 -------------------------------------------------------------------------------- SuperBill Details Patient Name: Date of Service: [name] 12/25/2022 Medical Record Number: [id number] Patient Account Number: [id number] Date of Birth/Sex: Treating RN: Oct 19, 1960 (62 y.o. 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 I89.0 Lymphedema, not elsewhere classified I87.2 Venous insufficiency (chronic) (peripheral) E66.01 Morbid (severe) obesity due to excess calories I10 Essential (primary) hypertension Facility Procedures : CPT4 Code: [id number] Description: 97597 - DEBRIDE WOUND 1ST 20 SQ CM OR < ICD-10 Diagnosis Description 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 Modifier: Quantity: 1 Physician Procedures : CPT4 Code Description Modifier [id number] 99214 - WC PHYS LEVEL 4 - EST PT 25 ICD-10 Diagnosis Description 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 I89.0  Lymphedema, not elsewhere classified I87.2 Venous insufficiency (chronic) (peripheral) Quantity: 1 : [id number] 97597 - WC PHYS DEBR WO ANESTH 20 SQ CM ICD-10 Diagnosis  Description 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 Compression Wrap: Urgo K2, (equivalent to a 4 layer) two layer compression system, regular 1 x Per Week Discharge Instructions: Apply Urgo K2 as directed (alternative to 4 layer compression). Wound #4 - Lower Leg Wound Laterality: Right, Posterior Cleanser: Soap and Water 1 x Per Week Discharge Instructions: May shower and wash wound with dial antibacterial soap and water prior to dressing change. Cleanser: Wound Cleanser 1 x Per Week Discharge Instructions: Cleanse the wound with wound cleanser prior to applying a clean dressing using gauze sponges, not tissue or cotton balls. Peri-Wound Care: Triamcinolone 15 (g) 1 x Per Week Discharge Instructions: Use triamcinolone 15 (g) as directed Peri-Wound Care: Sween Lotion (Moisturizing lotion) 1 x Per Week Discharge Instructions: Apply moisturizing lotion as directed Topical: Gentamicin 1 x Per Week Discharge Instructions: As directed by physician Topical: Mupirocin Ointment 1 x Per Week Discharge Instructions: Apply Mupirocin (Bactroban) as instructed Prim Dressing: Hydrofera Blue Ready Transfer Foam, 2.5x2.5 (in/in) 1 x Per Week ary Discharge Instructions: Apply directly to wound bed as directed Secondary Dressing: Woven Gauze Sponges 2x2 in 1 x Per Week Discharge Instructions: Apply over primary dressing as directed. Compression Wrap: Urgo K2, (equivalent to a 4 layer) two layer compression system, regular 1 x Per Week Discharge Instructions: Apply Urgo K2 as directed (alternative to 4 layer compression). Electronic Signature(s) Signed: 12/25/2022 5:23:56 PM By: [name] MD FACS Entered By: [name] on 12/25/2022 13:54:02 Problem List  Details -------------------------------------------------------------------------------- [name] ([id number]) [id number] Page 7 of 14 Patient Name: Date of Service: [place], [name] 12/25/2022 4:00 PM Medical Record Number: [id number] Patient Account Number: [id number] Date of Birth/Sex: Treating RN: 12-04-1960 (62 y.o. Gevena Mart Primary Care Provider: Nira Conn Other Clinician: Referring Provider: Treating Provider/Extender: Andrey Campanile in Treatment: 48 Active Problems ICD-10 Encounter Code Description Active Date MDM Diagnosis [phone] Non-pressure chronic ulcer of left calf with fat layer exposed 08/02/2022 No Yes L97.212 Non-pressure chronic ulcer of right calf with fat layer exposed 12/12/2022 No Yes I89.0 Lymphedema, not elsewhere classified 01/23/2022 No Yes I87.2 Venous insufficiency (chronic) (peripheral) 01/23/2022 No Yes E66.01 Morbid (severe) obesity due to excess calories 01/23/2022 No Yes I10 Essential (primary) hypertension 01/23/2022 No Yes Inactive Problems Resolved Problems Electronic Signature(s) Signed: 12/25/2022 4:46:38 PM By: [name] MD FACS Entered By: [name] on 12/25/2022 13:46:38 -------------------------------------------------------------------------------- Progress Note Details Patient Name: Date of Service: [name], [name] 12/25/2022 4:00 PM Medical Record Number: [id number] Patient Account Number: [id number] Date of Birth/Sex: Treating RN: July 30, 1960 (62 y.o. F) Primary Care Provider: Nira Conn Other Clinician: Referring Provider: Treating Provider/Extender: Andrey Campanile in Treatment: 48 Subjective Chief Complaint Information obtained from Patient RLE ulcer History of Present Illness (HPI) 02/16/2020 upon evaluation today patient actually appears to be doing poorly in regard to her left medial lower extremity  ulcer. This is actually an area that she tells me she has had intermittent issues with over the years although has been closed for some time she typically uses compression right now she has juxta lite compression wraps. With that being said she tells me that this nonetheless open several weeks/months ago and has been given her trouble since. She  daily and/or when sitting for 3-4 times a day throughout the day. Avoid standing for long periods of time. Exercise regularly WOUND #3: - Lower Leg Wound Laterality: Left, Posterior Cleanser: Soap and Water 1 x Per Week/ Discharge Instructions: May shower and wash wound with dial antibacterial soap and water prior to dressing change. Cleanser: Wound Cleanser 1 x Per Week/ Discharge Instructions: Cleanse the wound with wound cleanser prior to applying a clean dressing using gauze sponges, not tissue or  cotton balls. Peri-Wound Care: Triamcinolone 15 (g) 1 x Per Week/ Discharge Instructions: Use triamcinolone 15 (g) as directed Peri-Wound Care: Sween Lotion (Moisturizing lotion) 1 x Per Week/ Discharge Instructions: Apply moisturizing lotion as directed Topical: Gentamicin 1 x Per Week/ Discharge Instructions: As directed by physician Topical: Mupirocin Ointment 1 x Per Week/ Discharge Instructions: Apply Mupirocin (Bactroban) as instructed Prim Dressing: Hydrofera Blue Ready Transfer Foam, 2.5x2.5 (in/in) 1 x Per Week/ ary Discharge Instructions: Apply directly to wound bed as directed Secondary Dressing: Woven Gauze Sponges 2x2 in 1 x Per Week/ Discharge Instructions: Apply over primary dressing as directed. Com pression Wrap: Urgo K2, (equivalent to a 4 layer) two layer compression system, regular 1 x Per Week/ Discharge Instructions: Apply Urgo K2 as directed (alternative to 4 layer compression). WOUND #4: - Lower Leg Wound Laterality: Right, Posterior Cleanser: Soap and Water 1 x Per Week/ Discharge Instructions: May shower and wash wound with dial antibacterial soap and water prior to dressing change. Cleanser: Wound Cleanser 1 x Per IRISSA, FOGLIA ([id number]) [id number] Page 12 of 14 Discharge Instructions: Cleanse the wound with wound cleanser prior to applying a clean dressing using gauze sponges, not tissue or cotton balls. Peri-Wound Care: Triamcinolone 15 (g) 1 x Per Week/ Discharge Instructions: Use triamcinolone 15 (g) as directed Peri-Wound Care: Sween Lotion (Moisturizing lotion) 1 x Per Week/ Discharge Instructions: Apply moisturizing lotion as directed Topical: Gentamicin 1 x Per Week/ Discharge Instructions: As directed by physician Topical: Mupirocin Ointment 1 x Per Week/ Discharge Instructions: Apply Mupirocin (Bactroban) as instructed Prim Dressing: Hydrofera Blue Ready Transfer Foam, 2.5x2.5 (in/in) 1 x Per  Week/ ary Discharge Instructions: Apply directly to wound bed as directed Secondary Dressing: Woven Gauze Sponges 2x2 in 1 x Per Week/ Discharge Instructions: Apply over primary dressing as directed. Com pression Wrap: Urgo K2, (equivalent to a 4 layer) two layer compression system, regular 1 x Per Week/ Discharge Instructions: Apply Urgo K2 as directed (alternative to 4 layer compression). 12/25/2022: The left calf wound is stable, but the right calf wound is considerably smaller. Both have a little bit of slough present. Edema control is adequate. I used a curette to debride slough from both of her wounds. We will continue the mixture of topical gentamicin and mupirocin with Hydrofera Blue and Urgo 4- layer compression equivalent wraps bilaterally. Follow-up in 1 week. Electronic Signature(s) Signed: 12/25/2022 4:56:20 PM By: [name] MD FACS Entered By: [name] on 12/25/2022 13:56:20 -------------------------------------------------------------------------------- HxROS Details Patient Name: Date of Service: [name], [name] 12/25/2022 4:00 PM Medical Record Number: [id number] Patient Account Number: [id number] Date of Birth/Sex: Treating RN: 19-Nov-1960 (62 y.o. F) Primary Care Provider: Nira Conn Other Clinician: Referring Provider: Treating Provider/Extender: Andrey Campanile in Treatment: 48 Information Obtained From Patient Eyes Medical History: Negative for: Cataracts; Glaucoma; Optic Neuritis Ear/Nose/Mouth/Throat Medical History: Negative for: Chronic sinus problems/congestion; Middle ear problems Hematologic/Lymphatic Medical History: Positive for: Anemia - iron Negative for: Hemophilia; Human Immunodeficiency Virus; Lymphedema; Sickle Cell Disease  daily and/or when sitting for 3-4 times a day throughout the day. Avoid standing for long periods of time. Exercise regularly WOUND #3: - Lower Leg Wound Laterality: Left, Posterior Cleanser: Soap and Water 1 x Per Week/ Discharge Instructions: May shower and wash wound with dial antibacterial soap and water prior to dressing change. Cleanser: Wound Cleanser 1 x Per Week/ Discharge Instructions: Cleanse the wound with wound cleanser prior to applying a clean dressing using gauze sponges, not tissue or  cotton balls. Peri-Wound Care: Triamcinolone 15 (g) 1 x Per Week/ Discharge Instructions: Use triamcinolone 15 (g) as directed Peri-Wound Care: Sween Lotion (Moisturizing lotion) 1 x Per Week/ Discharge Instructions: Apply moisturizing lotion as directed Topical: Gentamicin 1 x Per Week/ Discharge Instructions: As directed by physician Topical: Mupirocin Ointment 1 x Per Week/ Discharge Instructions: Apply Mupirocin (Bactroban) as instructed Prim Dressing: Hydrofera Blue Ready Transfer Foam, 2.5x2.5 (in/in) 1 x Per Week/ ary Discharge Instructions: Apply directly to wound bed as directed Secondary Dressing: Woven Gauze Sponges 2x2 in 1 x Per Week/ Discharge Instructions: Apply over primary dressing as directed. Com pression Wrap: Urgo K2, (equivalent to a 4 layer) two layer compression system, regular 1 x Per Week/ Discharge Instructions: Apply Urgo K2 as directed (alternative to 4 layer compression). WOUND #4: - Lower Leg Wound Laterality: Right, Posterior Cleanser: Soap and Water 1 x Per Week/ Discharge Instructions: May shower and wash wound with dial antibacterial soap and water prior to dressing change. Cleanser: Wound Cleanser 1 x Per IRISSA, FOGLIA ([id number]) [id number] Page 12 of 14 Discharge Instructions: Cleanse the wound with wound cleanser prior to applying a clean dressing using gauze sponges, not tissue or cotton balls. Peri-Wound Care: Triamcinolone 15 (g) 1 x Per Week/ Discharge Instructions: Use triamcinolone 15 (g) as directed Peri-Wound Care: Sween Lotion (Moisturizing lotion) 1 x Per Week/ Discharge Instructions: Apply moisturizing lotion as directed Topical: Gentamicin 1 x Per Week/ Discharge Instructions: As directed by physician Topical: Mupirocin Ointment 1 x Per Week/ Discharge Instructions: Apply Mupirocin (Bactroban) as instructed Prim Dressing: Hydrofera Blue Ready Transfer Foam, 2.5x2.5 (in/in) 1 x Per  Week/ ary Discharge Instructions: Apply directly to wound bed as directed Secondary Dressing: Woven Gauze Sponges 2x2 in 1 x Per Week/ Discharge Instructions: Apply over primary dressing as directed. Com pression Wrap: Urgo K2, (equivalent to a 4 layer) two layer compression system, regular 1 x Per Week/ Discharge Instructions: Apply Urgo K2 as directed (alternative to 4 layer compression). 12/25/2022: The left calf wound is stable, but the right calf wound is considerably smaller. Both have a little bit of slough present. Edema control is adequate. I used a curette to debride slough from both of her wounds. We will continue the mixture of topical gentamicin and mupirocin with Hydrofera Blue and Urgo 4- layer compression equivalent wraps bilaterally. Follow-up in 1 week. Electronic Signature(s) Signed: 12/25/2022 4:56:20 PM By: [name] MD FACS Entered By: [name] on 12/25/2022 13:56:20 -------------------------------------------------------------------------------- HxROS Details Patient Name: Date of Service: [name], [name] 12/25/2022 4:00 PM Medical Record Number: [id number] Patient Account Number: [id number] Date of Birth/Sex: Treating RN: 19-Nov-1960 (62 y.o. F) Primary Care Provider: Nira Conn Other Clinician: Referring Provider: Treating Provider/Extender: Andrey Campanile in Treatment: 48 Information Obtained From Patient Eyes Medical History: Negative for: Cataracts; Glaucoma; Optic Neuritis Ear/Nose/Mouth/Throat Medical History: Negative for: Chronic sinus problems/congestion; Middle ear problems Hematologic/Lymphatic Medical History: Positive for: Anemia - iron Negative for: Hemophilia; Human Immunodeficiency Virus; Lymphedema; Sickle Cell Disease  daily and/or when sitting for 3-4 times a day throughout the day. Avoid standing for long periods of time. Exercise regularly WOUND #3: - Lower Leg Wound Laterality: Left, Posterior Cleanser: Soap and Water 1 x Per Week/ Discharge Instructions: May shower and wash wound with dial antibacterial soap and water prior to dressing change. Cleanser: Wound Cleanser 1 x Per Week/ Discharge Instructions: Cleanse the wound with wound cleanser prior to applying a clean dressing using gauze sponges, not tissue or  cotton balls. Peri-Wound Care: Triamcinolone 15 (g) 1 x Per Week/ Discharge Instructions: Use triamcinolone 15 (g) as directed Peri-Wound Care: Sween Lotion (Moisturizing lotion) 1 x Per Week/ Discharge Instructions: Apply moisturizing lotion as directed Topical: Gentamicin 1 x Per Week/ Discharge Instructions: As directed by physician Topical: Mupirocin Ointment 1 x Per Week/ Discharge Instructions: Apply Mupirocin (Bactroban) as instructed Prim Dressing: Hydrofera Blue Ready Transfer Foam, 2.5x2.5 (in/in) 1 x Per Week/ ary Discharge Instructions: Apply directly to wound bed as directed Secondary Dressing: Woven Gauze Sponges 2x2 in 1 x Per Week/ Discharge Instructions: Apply over primary dressing as directed. Com pression Wrap: Urgo K2, (equivalent to a 4 layer) two layer compression system, regular 1 x Per Week/ Discharge Instructions: Apply Urgo K2 as directed (alternative to 4 layer compression). WOUND #4: - Lower Leg Wound Laterality: Right, Posterior Cleanser: Soap and Water 1 x Per Week/ Discharge Instructions: May shower and wash wound with dial antibacterial soap and water prior to dressing change. Cleanser: Wound Cleanser 1 x Per IRISSA, FOGLIA ([id number]) [id number] Page 12 of 14 Discharge Instructions: Cleanse the wound with wound cleanser prior to applying a clean dressing using gauze sponges, not tissue or cotton balls. Peri-Wound Care: Triamcinolone 15 (g) 1 x Per Week/ Discharge Instructions: Use triamcinolone 15 (g) as directed Peri-Wound Care: Sween Lotion (Moisturizing lotion) 1 x Per Week/ Discharge Instructions: Apply moisturizing lotion as directed Topical: Gentamicin 1 x Per Week/ Discharge Instructions: As directed by physician Topical: Mupirocin Ointment 1 x Per Week/ Discharge Instructions: Apply Mupirocin (Bactroban) as instructed Prim Dressing: Hydrofera Blue Ready Transfer Foam, 2.5x2.5 (in/in) 1 x Per  Week/ ary Discharge Instructions: Apply directly to wound bed as directed Secondary Dressing: Woven Gauze Sponges 2x2 in 1 x Per Week/ Discharge Instructions: Apply over primary dressing as directed. Com pression Wrap: Urgo K2, (equivalent to a 4 layer) two layer compression system, regular 1 x Per Week/ Discharge Instructions: Apply Urgo K2 as directed (alternative to 4 layer compression). 12/25/2022: The left calf wound is stable, but the right calf wound is considerably smaller. Both have a little bit of slough present. Edema control is adequate. I used a curette to debride slough from both of her wounds. We will continue the mixture of topical gentamicin and mupirocin with Hydrofera Blue and Urgo 4- layer compression equivalent wraps bilaterally. Follow-up in 1 week. Electronic Signature(s) Signed: 12/25/2022 4:56:20 PM By: [name] MD FACS Entered By: [name] on 12/25/2022 13:56:20 -------------------------------------------------------------------------------- HxROS Details Patient Name: Date of Service: [name], [name] 12/25/2022 4:00 PM Medical Record Number: [id number] Patient Account Number: [id number] Date of Birth/Sex: Treating RN: 19-Nov-1960 (62 y.o. F) Primary Care Provider: Nira Conn Other Clinician: Referring Provider: Treating Provider/Extender: Andrey Campanile in Treatment: 48 Information Obtained From Patient Eyes Medical History: Negative for: Cataracts; Glaucoma; Optic Neuritis Ear/Nose/Mouth/Throat Medical History: Negative for: Chronic sinus problems/congestion; Middle ear problems Hematologic/Lymphatic Medical History: Positive for: Anemia - iron Negative for: Hemophilia; Human Immunodeficiency Virus; Lymphedema; Sickle Cell Disease  Week Discharge Instructions: Apply Mupirocin (Bactroban) as instructed Prim Dressing: Hydrofera Blue Ready Transfer Foam, 2.5x2.5 (in/in) 1 x Per  Week ary Discharge Instructions: Apply directly to wound bed as directed Secondary Dressing: Woven Gauze Sponges 2x2 in 1 x Per Week Discharge Instructions: Apply over primary dressing as directed. Compression Wrap: Urgo K2, (equivalent to a 4 layer) two layer compression system, regular 1 x Per Week Discharge Instructions: Apply Urgo K2 as directed (alternative to 4 layer compression). Wound #4 - Lower Leg Wound Laterality: Right, Posterior Cleanser: Soap and Water 1 x Per Week Discharge Instructions: May shower and wash wound with dial antibacterial soap and water prior to dressing change. Cleanser: Wound Cleanser 1 x Per Week Discharge Instructions: Cleanse the wound with wound cleanser prior to applying a clean dressing using gauze sponges, not tissue or cotton balls. Peri-Wound Care: Triamcinolone 15 (g) 1 x Per Week Discharge Instructions: Use triamcinolone 15 (g) as directed Peri-Wound Care: Sween Lotion (Moisturizing lotion) 1 x Per Week Discharge Instructions: Apply moisturizing lotion as directed Topical: Gentamicin 1 x Per Week Discharge Instructions: As directed by physician Topical: Mupirocin Ointment 1 x Per Week Discharge Instructions: Apply Mupirocin (Bactroban) as instructed Prim Dressing: Hydrofera Blue Ready Transfer Foam, 2.5x2.5 (in/in) 1 x Per Week ary Discharge Instructions: Apply directly to wound bed as directed Secondary Dressing: Woven Gauze Sponges 2x2 in 1 x Per Week Discharge Instructions: Apply over primary dressing as directed. Compression Wrap: Urgo K2, (equivalent to a 4 layer) two layer compression system, regular 1 x Per Week Discharge Instructions: Apply Urgo K2 as directed (alternative to 4 layer compression). Electronic Signature(s) Signed: 12/25/2022 5:23:56 PM By: [name] MD FACS Entered By: [name] on 12/25/2022 13:54:02 Problem List  Details -------------------------------------------------------------------------------- [name] ([id number]) [id number] Page 7 of 14 Patient Name: Date of Service: [place], [name] 12/25/2022 4:00 PM Medical Record Number: [id number] Patient Account Number: [id number] Date of Birth/Sex: Treating RN: 12-04-1960 (62 y.o. Gevena Mart Primary Care Provider: Nira Conn Other Clinician: Referring Provider: Treating Provider/Extender: Andrey Campanile in Treatment: 48 Active Problems ICD-10 Encounter Code Description Active Date MDM Diagnosis [phone] Non-pressure chronic ulcer of left calf with fat layer exposed 08/02/2022 No Yes L97.212 Non-pressure chronic ulcer of right calf with fat layer exposed 12/12/2022 No Yes I89.0 Lymphedema, not elsewhere classified 01/23/2022 No Yes I87.2 Venous insufficiency (chronic) (peripheral) 01/23/2022 No Yes E66.01 Morbid (severe) obesity due to excess calories 01/23/2022 No Yes I10 Essential (primary) hypertension 01/23/2022 No Yes Inactive Problems Resolved Problems Electronic Signature(s) Signed: 12/25/2022 4:46:38 PM By: [name] MD FACS Entered By: [name] on 12/25/2022 13:46:38 -------------------------------------------------------------------------------- Progress Note Details Patient Name: Date of Service: [name], [name] 12/25/2022 4:00 PM Medical Record Number: [id number] Patient Account Number: [id number] Date of Birth/Sex: Treating RN: July 30, 1960 (62 y.o. F) Primary Care Provider: Nira Conn Other Clinician: Referring Provider: Treating Provider/Extender: Andrey Campanile in Treatment: 48 Subjective Chief Complaint Information obtained from Patient RLE ulcer History of Present Illness (HPI) 02/16/2020 upon evaluation today patient actually appears to be doing poorly in regard to her left medial lower extremity  ulcer. This is actually an area that she tells me she has had intermittent issues with over the years although has been closed for some time she typically uses compression right now she has juxta lite compression wraps. With that being said she tells me that this nonetheless open several weeks/months ago and has been given her trouble since. She  Week Discharge Instructions: Apply Mupirocin (Bactroban) as instructed Prim Dressing: Hydrofera Blue Ready Transfer Foam, 2.5x2.5 (in/in) 1 x Per  Week ary Discharge Instructions: Apply directly to wound bed as directed Secondary Dressing: Woven Gauze Sponges 2x2 in 1 x Per Week Discharge Instructions: Apply over primary dressing as directed. Compression Wrap: Urgo K2, (equivalent to a 4 layer) two layer compression system, regular 1 x Per Week Discharge Instructions: Apply Urgo K2 as directed (alternative to 4 layer compression). Wound #4 - Lower Leg Wound Laterality: Right, Posterior Cleanser: Soap and Water 1 x Per Week Discharge Instructions: May shower and wash wound with dial antibacterial soap and water prior to dressing change. Cleanser: Wound Cleanser 1 x Per Week Discharge Instructions: Cleanse the wound with wound cleanser prior to applying a clean dressing using gauze sponges, not tissue or cotton balls. Peri-Wound Care: Triamcinolone 15 (g) 1 x Per Week Discharge Instructions: Use triamcinolone 15 (g) as directed Peri-Wound Care: Sween Lotion (Moisturizing lotion) 1 x Per Week Discharge Instructions: Apply moisturizing lotion as directed Topical: Gentamicin 1 x Per Week Discharge Instructions: As directed by physician Topical: Mupirocin Ointment 1 x Per Week Discharge Instructions: Apply Mupirocin (Bactroban) as instructed Prim Dressing: Hydrofera Blue Ready Transfer Foam, 2.5x2.5 (in/in) 1 x Per Week ary Discharge Instructions: Apply directly to wound bed as directed Secondary Dressing: Woven Gauze Sponges 2x2 in 1 x Per Week Discharge Instructions: Apply over primary dressing as directed. Compression Wrap: Urgo K2, (equivalent to a 4 layer) two layer compression system, regular 1 x Per Week Discharge Instructions: Apply Urgo K2 as directed (alternative to 4 layer compression). Electronic Signature(s) Signed: 12/25/2022 5:23:56 PM By: [name] MD FACS Entered By: [name] on 12/25/2022 13:54:02 Problem List  Details -------------------------------------------------------------------------------- [name] ([id number]) [id number] Page 7 of 14 Patient Name: Date of Service: [place], [name] 12/25/2022 4:00 PM Medical Record Number: [id number] Patient Account Number: [id number] Date of Birth/Sex: Treating RN: 12-04-1960 (62 y.o. Gevena Mart Primary Care Provider: Nira Conn Other Clinician: Referring Provider: Treating Provider/Extender: Andrey Campanile in Treatment: 48 Active Problems ICD-10 Encounter Code Description Active Date MDM Diagnosis [phone] Non-pressure chronic ulcer of left calf with fat layer exposed 08/02/2022 No Yes L97.212 Non-pressure chronic ulcer of right calf with fat layer exposed 12/12/2022 No Yes I89.0 Lymphedema, not elsewhere classified 01/23/2022 No Yes I87.2 Venous insufficiency (chronic) (peripheral) 01/23/2022 No Yes E66.01 Morbid (severe) obesity due to excess calories 01/23/2022 No Yes I10 Essential (primary) hypertension 01/23/2022 No Yes Inactive Problems Resolved Problems Electronic Signature(s) Signed: 12/25/2022 4:46:38 PM By: [name] MD FACS Entered By: [name] on 12/25/2022 13:46:38 -------------------------------------------------------------------------------- Progress Note Details Patient Name: Date of Service: [name], [name] 12/25/2022 4:00 PM Medical Record Number: [id number] Patient Account Number: [id number] Date of Birth/Sex: Treating RN: July 30, 1960 (62 y.o. F) Primary Care Provider: Nira Conn Other Clinician: Referring Provider: Treating Provider/Extender: Andrey Campanile in Treatment: 48 Subjective Chief Complaint Information obtained from Patient RLE ulcer History of Present Illness (HPI) 02/16/2020 upon evaluation today patient actually appears to be doing poorly in regard to her left medial lower extremity  ulcer. This is actually an area that she tells me she has had intermittent issues with over the years although has been closed for some time she typically uses compression right now she has juxta lite compression wraps. With that being said she tells me that this nonetheless open several weeks/months ago and has been given her trouble since. She  is about a millimeter in each dimension. There is slough accumulation present. 11/20/2022: There is a little bit of slough on the wound surface. It is about the same size as last week. Edema control is good. 11/27/2022: A tiny opening remains with slough on the surface. Edema control remains excellent. AUBREYLYNN, KINDEL ([id number])  [id number] Page 5 of 14 12/05/2022: A small opening remains underneath some eschar and slough. 12/12/2022: No difference in the wound on her left leg. Unfortunately, over the weekend, her right leg wound reopened. It is a little over a centimeter in diameter and very superficial with some slough on the surface. 12/18/2022: Both wounds are larger today. There is increased slough on both surfaces, as well. Although edema control seems adequate, she is on her feet quite a bit more due to returning to school and being on her feet much of the day. 12/25/2022: The left calf wound is stable, but the right calf wound is considerably smaller. Both have a little bit of slough present. Edema control is adequate. Electronic Signature(s) Signed: 12/25/2022 4:47:45 PM By: [name] MD FACS Entered By: [name] on 12/25/2022 13:47:45 -------------------------------------------------------------------------------- Physical Exam Details Patient Name: Date of Service: [name], [name] 12/25/2022 4:00 PM Medical Record Number: [id number] Patient Account Number: [id number] Date of Birth/Sex: Treating RN: 26-Oct-1960 (62 y.o. F) Primary Care Provider: Nira Conn Other Clinician: Referring Provider: Treating Provider/Extender: Andrey Campanile in Treatment: 48 Constitutional . . . . no acute distress. Respiratory Normal work of breathing on room air. Notes 12/25/2022: The left calf wound is stable, but the right calf wound is considerably smaller. Both have a little bit of slough present. Edema control is adequate. Electronic Signature(s) Signed: 12/25/2022 4:50:01 PM By: [name] MD FACS Entered By: [name] on 12/25/2022 13:50:01 -------------------------------------------------------------------------------- Physician Orders Details Patient Name: Date of Service: [name], [name] 12/25/2022 4:00 PM Medical Record  Number: [id number] Patient Account Number: [id number] Date of Birth/Sex: Treating RN: 1960-07-14 (62 y.o. Gevena Mart Primary Care Provider: Nira Conn Other Clinician: Referring Provider: Treating Provider/Extender: Andrey Campanile in Treatment: [phone] Verbal / Phone Orders: No Diagnosis Coding Follow-up Appointments ppointment in 1 week. - Dr. Lady Gary - room 1 Return A Anesthetic (In clinic) Topical Lidocaine 4% applied to wound bed Bathing/ Shower/ Hygiene May shower with protection but do not get wound dressing(s) wet. Protect dressing(s) with water repellant cover (for example, large plastic bag) or a cast cover and may then take shower. Edema Control - Lymphedema / SCD / WANIYA, KWONG ([id number]) [id number] Page 6 of 14 Elevate legs to the level of the heart or above for 30 minutes daily and/or when sitting for 3-4 times a day throughout the day. Avoid standing for long periods of time. Exercise regularly Wound Treatment Wound #3 - Lower Leg Wound Laterality: Left, Posterior Cleanser: Soap and Water 1 x Per Week Discharge Instructions: May shower and wash wound with dial antibacterial soap and water prior to dressing change. Cleanser: Wound Cleanser 1 x Per Week Discharge Instructions: Cleanse the wound with wound cleanser prior to applying a clean dressing using gauze sponges, not tissue or cotton balls. Peri-Wound Care: Triamcinolone 15 (g) 1 x Per Week Discharge Instructions: Use triamcinolone 15 (g) as directed Peri-Wound Care: Sween Lotion (Moisturizing lotion) 1 x Per Week Discharge Instructions: Apply moisturizing lotion as directed Topical: Gentamicin 1 x Per Week Discharge Instructions: As directed by physician Topical: Mupirocin Ointment 1 x Per  Week Discharge Instructions: Apply Mupirocin (Bactroban) as instructed Prim Dressing: Hydrofera Blue Ready Transfer Foam, 2.5x2.5 (in/in) 1 x Per  Week ary Discharge Instructions: Apply directly to wound bed as directed Secondary Dressing: Woven Gauze Sponges 2x2 in 1 x Per Week Discharge Instructions: Apply over primary dressing as directed. Compression Wrap: Urgo K2, (equivalent to a 4 layer) two layer compression system, regular 1 x Per Week Discharge Instructions: Apply Urgo K2 as directed (alternative to 4 layer compression). Wound #4 - Lower Leg Wound Laterality: Right, Posterior Cleanser: Soap and Water 1 x Per Week Discharge Instructions: May shower and wash wound with dial antibacterial soap and water prior to dressing change. Cleanser: Wound Cleanser 1 x Per Week Discharge Instructions: Cleanse the wound with wound cleanser prior to applying a clean dressing using gauze sponges, not tissue or cotton balls. Peri-Wound Care: Triamcinolone 15 (g) 1 x Per Week Discharge Instructions: Use triamcinolone 15 (g) as directed Peri-Wound Care: Sween Lotion (Moisturizing lotion) 1 x Per Week Discharge Instructions: Apply moisturizing lotion as directed Topical: Gentamicin 1 x Per Week Discharge Instructions: As directed by physician Topical: Mupirocin Ointment 1 x Per Week Discharge Instructions: Apply Mupirocin (Bactroban) as instructed Prim Dressing: Hydrofera Blue Ready Transfer Foam, 2.5x2.5 (in/in) 1 x Per Week ary Discharge Instructions: Apply directly to wound bed as directed Secondary Dressing: Woven Gauze Sponges 2x2 in 1 x Per Week Discharge Instructions: Apply over primary dressing as directed. Compression Wrap: Urgo K2, (equivalent to a 4 layer) two layer compression system, regular 1 x Per Week Discharge Instructions: Apply Urgo K2 as directed (alternative to 4 layer compression). Electronic Signature(s) Signed: 12/25/2022 5:23:56 PM By: [name] MD FACS Entered By: [name] on 12/25/2022 13:54:02 Problem List  Details -------------------------------------------------------------------------------- [name] ([id number]) [id number] Page 7 of 14 Patient Name: Date of Service: [place], [name] 12/25/2022 4:00 PM Medical Record Number: [id number] Patient Account Number: [id number] Date of Birth/Sex: Treating RN: 12-04-1960 (62 y.o. Gevena Mart Primary Care Provider: Nira Conn Other Clinician: Referring Provider: Treating Provider/Extender: Andrey Campanile in Treatment: 48 Active Problems ICD-10 Encounter Code Description Active Date MDM Diagnosis [phone] Non-pressure chronic ulcer of left calf with fat layer exposed 08/02/2022 No Yes L97.212 Non-pressure chronic ulcer of right calf with fat layer exposed 12/12/2022 No Yes I89.0 Lymphedema, not elsewhere classified 01/23/2022 No Yes I87.2 Venous insufficiency (chronic) (peripheral) 01/23/2022 No Yes E66.01 Morbid (severe) obesity due to excess calories 01/23/2022 No Yes I10 Essential (primary) hypertension 01/23/2022 No Yes Inactive Problems Resolved Problems Electronic Signature(s) Signed: 12/25/2022 4:46:38 PM By: [name] MD FACS Entered By: [name] on 12/25/2022 13:46:38 -------------------------------------------------------------------------------- Progress Note Details Patient Name: Date of Service: [name], [name] 12/25/2022 4:00 PM Medical Record Number: [id number] Patient Account Number: [id number] Date of Birth/Sex: Treating RN: July 30, 1960 (62 y.o. F) Primary Care Provider: Nira Conn Other Clinician: Referring Provider: Treating Provider/Extender: Andrey Campanile in Treatment: 48 Subjective Chief Complaint Information obtained from Patient RLE ulcer History of Present Illness (HPI) 02/16/2020 upon evaluation today patient actually appears to be doing poorly in regard to her left medial lower extremity  ulcer. This is actually an area that she tells me she has had intermittent issues with over the years although has been closed for some time she typically uses compression right now she has juxta lite compression wraps. With that being said she tells me that this nonetheless open several weeks/months ago and has been given her trouble since. She

## 2023-01-01 ENCOUNTER — Encounter (HOSPITAL_BASED_OUTPATIENT_CLINIC_OR_DEPARTMENT_OTHER): Payer: BC Managed Care – PPO | Admitting: General Surgery

## 2023-01-01 DIAGNOSIS — L97212 Non-pressure chronic ulcer of right calf with fat layer exposed: Secondary | ICD-10-CM | POA: Diagnosis not present

## 2023-01-02 NOTE — Progress Notes (Signed)
[address] [place], [name] 01/01/2023 3:30 PM Medical Record Number: [id number] Patient Account Number: [id number] Date of Birth/Sex: Treating RN: Feb 25, 1961 (62 y.o. [name] Primary Care Provider: Nira Conn Other Clinician: Referring Provider: Treating Provider/Extender: Andrey Campanile in Treatment: 21 History of Present Illness HPI Description: 02/16/2020 upon evaluation today patient actually appears to be doing poorly in regard to her left medial lower extremity ulcer. This is actually an area that she tells me she has had intermittent issues with over the years although has been closed for some time she typically uses compression right now she has juxta lite compression wraps. With that being said she tells me that this nonetheless open several weeks/months ago and has been given her trouble since. She does have a history of chronic venous insufficiency she is seeing specialist for this in the past she has had an ablation as well as sclerotherapy. With that being said she also has  hypertension chronically which is managed by her primary care provider. In general she seems to be worsening overall with regard to the wound and states that she finally realized that she needed to come in and have somebody look at this and not continue to try to manage this on her own. No fevers, chills, nausea, vomiting, or diarrhea. 02/23/2020 on evaluation today patient appears to be doing well with regard to her wound. This is showing some signs of improvement which is great news still were not quite at the point where I would like to be as far as the overall appearance of the wound is concerned but I do believe this is better than last week. I do believe the Iodoflex is helping as well. 03/08/2020 upon evaluation today patient appears to be doing well with regard to her wound. She has been tolerating the dressing changes without complication. Fortunately I feel like she has made great progress with the Iodoflex but I feel like it may be the point rest to switch to something else possibly a collagen- based dressing at this time. 03/15/2020 upon evaluation today patient appears to be doing excellent in regard to her leg ulcer. She has been tolerating the dressing changes without complication. Fortunately there is no signs of active infection. Overall she is measuring a little bit smaller today which is great news. 03/22/2020 upon evaluation today patient appears to be doing well with regard to her wound. She has been tolerating the dressing changes without complication. Fortunately there is no signs of active infection at this time. 03/28/2020; patient I do not usually see however she has a wound on the left anterior lower leg secondary to chronic venous insufficiency we have been using silver collagen under compression. She arrives in clinic with a nonviable surface requiring debridement 04/12/2020 upon evaluation today patient appears to be doing well all things considered with regard to her leg  ulcer. She is tolerating the dressing changes without complication there is minimal dry skin around the edges of the wound that may be trapping and stopping some of the events of the new skin I am can work on that today. Otherwise the surface of the wound appears to be doing excellent. 04/19/2020 upon evaluation today patient appears to be doing well with regard to her leg ulcer. She has been tolerating dressing changes without complication. Fortunately there is no signs of active infection at this time. No fever chills noted overall very pleased with how things seem to be progressing. 04/26/2020 on evaluation today patient appears  PM Medical Record Number: [id number] Patient Account Number: [id number] Date of Birth/Sex: Treating RN: Sep 08, 1960 (62 y.o. [name] Primary Care Provider: Nira Conn Other Clinician: Referring Provider: Treating Provider/Extender: Andrey Campanile in Treatment: 34 Subjective Chief Complaint Information obtained from Patient RLE ulcer History of Present Illness (HPI) 02/16/2020 upon evaluation today patient actually appears to be doing poorly in regard to her left medial lower extremity ulcer. This is actually an area that she tells me she has had intermittent issues with over the years although has been closed for some time she typically uses compression right now she has juxta lite compression wraps. With that being said she tells me that this nonetheless open several weeks/months ago and has been given her trouble since. She does have a history of chronic venous insufficiency she is seeing specialist for this in the past she has had an ablation as well as sclerotherapy. With that being said she also has hypertension chronically which is managed by her primary care provider. In general she seems to be worsening overall with regard to the wound and states that she finally realized that she needed to come in and have somebody look at this and not continue to try to manage this on her own. No fevers, chills, nausea, vomiting, or diarrhea. 02/23/2020 on evaluation today patient appears to be doing well with regard to her wound. This is showing some signs of improvement which is great news still were not quite at the  point where I would like to be as far as the overall appearance of the wound is concerned but I do believe this is better than last week. I do believe the Iodoflex is helping as well. 03/08/2020 upon evaluation today patient appears to be doing well with regard to her wound. She has been tolerating the dressing changes without complication. Fortunately I feel like she has made great progress with the Iodoflex but I feel like it may be the point rest to switch to something else possibly a collagen- based dressing at this time. 03/15/2020 upon evaluation today patient appears to be doing excellent in regard to her leg ulcer. She has been tolerating the dressing changes without complication. Fortunately there is no signs of active infection. Overall she is measuring a little bit smaller today which is great news. 03/22/2020 upon evaluation today patient appears to be doing well with regard to her wound. She has been tolerating the dressing changes without complication. Fortunately there is no signs of active infection at this time. 03/28/2020; patient I do not usually see however she has a wound on the left anterior lower leg secondary to chronic venous insufficiency we have been using silver collagen under compression. She arrives in clinic with a nonviable surface requiring debridement 04/12/2020 upon evaluation today patient appears to be doing well all things considered with regard to her leg ulcer. She is tolerating the dressing changes without complication there is minimal dry skin around the edges of the wound that may be trapping and stopping some of the events of the new skin I am can work on that today. Otherwise the surface of the wound appears to be doing excellent. 04/19/2020 upon evaluation today patient appears to be doing well with regard to her leg ulcer. She has been tolerating dressing changes without complication. Fortunately there is no signs of active infection at this time. No fever  chills noted overall very pleased with how things seem to be progressing. 04/26/2020 on evaluation today patient  [address] [place], [name] 01/01/2023 3:30 PM Medical Record Number: [id number] Patient Account Number: [id number] Date of Birth/Sex: Treating RN: Feb 25, 1961 (62 y.o. [name] Primary Care Provider: Nira Conn Other Clinician: Referring Provider: Treating Provider/Extender: Andrey Campanile in Treatment: 21 History of Present Illness HPI Description: 02/16/2020 upon evaluation today patient actually appears to be doing poorly in regard to her left medial lower extremity ulcer. This is actually an area that she tells me she has had intermittent issues with over the years although has been closed for some time she typically uses compression right now she has juxta lite compression wraps. With that being said she tells me that this nonetheless open several weeks/months ago and has been given her trouble since. She does have a history of chronic venous insufficiency she is seeing specialist for this in the past she has had an ablation as well as sclerotherapy. With that being said she also has  hypertension chronically which is managed by her primary care provider. In general she seems to be worsening overall with regard to the wound and states that she finally realized that she needed to come in and have somebody look at this and not continue to try to manage this on her own. No fevers, chills, nausea, vomiting, or diarrhea. 02/23/2020 on evaluation today patient appears to be doing well with regard to her wound. This is showing some signs of improvement which is great news still were not quite at the point where I would like to be as far as the overall appearance of the wound is concerned but I do believe this is better than last week. I do believe the Iodoflex is helping as well. 03/08/2020 upon evaluation today patient appears to be doing well with regard to her wound. She has been tolerating the dressing changes without complication. Fortunately I feel like she has made great progress with the Iodoflex but I feel like it may be the point rest to switch to something else possibly a collagen- based dressing at this time. 03/15/2020 upon evaluation today patient appears to be doing excellent in regard to her leg ulcer. She has been tolerating the dressing changes without complication. Fortunately there is no signs of active infection. Overall she is measuring a little bit smaller today which is great news. 03/22/2020 upon evaluation today patient appears to be doing well with regard to her wound. She has been tolerating the dressing changes without complication. Fortunately there is no signs of active infection at this time. 03/28/2020; patient I do not usually see however she has a wound on the left anterior lower leg secondary to chronic venous insufficiency we have been using silver collagen under compression. She arrives in clinic with a nonviable surface requiring debridement 04/12/2020 upon evaluation today patient appears to be doing well all things considered with regard to her leg  ulcer. She is tolerating the dressing changes without complication there is minimal dry skin around the edges of the wound that may be trapping and stopping some of the events of the new skin I am can work on that today. Otherwise the surface of the wound appears to be doing excellent. 04/19/2020 upon evaluation today patient appears to be doing well with regard to her leg ulcer. She has been tolerating dressing changes without complication. Fortunately there is no signs of active infection at this time. No fever chills noted overall very pleased with how things seem to be progressing. 04/26/2020 on evaluation today patient appears  PM Medical Record Number: [id number] Patient Account Number: [id number] Date of Birth/Sex: Treating RN: Sep 08, 1960 (62 y.o. [name] Primary Care Provider: Nira Conn Other Clinician: Referring Provider: Treating Provider/Extender: Andrey Campanile in Treatment: 34 Subjective Chief Complaint Information obtained from Patient RLE ulcer History of Present Illness (HPI) 02/16/2020 upon evaluation today patient actually appears to be doing poorly in regard to her left medial lower extremity ulcer. This is actually an area that she tells me she has had intermittent issues with over the years although has been closed for some time she typically uses compression right now she has juxta lite compression wraps. With that being said she tells me that this nonetheless open several weeks/months ago and has been given her trouble since. She does have a history of chronic venous insufficiency she is seeing specialist for this in the past she has had an ablation as well as sclerotherapy. With that being said she also has hypertension chronically which is managed by her primary care provider. In general she seems to be worsening overall with regard to the wound and states that she finally realized that she needed to come in and have somebody look at this and not continue to try to manage this on her own. No fevers, chills, nausea, vomiting, or diarrhea. 02/23/2020 on evaluation today patient appears to be doing well with regard to her wound. This is showing some signs of improvement which is great news still were not quite at the  point where I would like to be as far as the overall appearance of the wound is concerned but I do believe this is better than last week. I do believe the Iodoflex is helping as well. 03/08/2020 upon evaluation today patient appears to be doing well with regard to her wound. She has been tolerating the dressing changes without complication. Fortunately I feel like she has made great progress with the Iodoflex but I feel like it may be the point rest to switch to something else possibly a collagen- based dressing at this time. 03/15/2020 upon evaluation today patient appears to be doing excellent in regard to her leg ulcer. She has been tolerating the dressing changes without complication. Fortunately there is no signs of active infection. Overall she is measuring a little bit smaller today which is great news. 03/22/2020 upon evaluation today patient appears to be doing well with regard to her wound. She has been tolerating the dressing changes without complication. Fortunately there is no signs of active infection at this time. 03/28/2020; patient I do not usually see however she has a wound on the left anterior lower leg secondary to chronic venous insufficiency we have been using silver collagen under compression. She arrives in clinic with a nonviable surface requiring debridement 04/12/2020 upon evaluation today patient appears to be doing well all things considered with regard to her leg ulcer. She is tolerating the dressing changes without complication there is minimal dry skin around the edges of the wound that may be trapping and stopping some of the events of the new skin I am can work on that today. Otherwise the surface of the wound appears to be doing excellent. 04/19/2020 upon evaluation today patient appears to be doing well with regard to her leg ulcer. She has been tolerating dressing changes without complication. Fortunately there is no signs of active infection at this time. No fever  chills noted overall very pleased with how things seem to be progressing. 04/26/2020 on evaluation today patient  PM Medical Record Number: [id number] Patient Account Number: [id number] Date of Birth/Sex: Treating RN: Sep 08, 1960 (62 y.o. [name] Primary Care Provider: Nira Conn Other Clinician: Referring Provider: Treating Provider/Extender: Andrey Campanile in Treatment: 34 Subjective Chief Complaint Information obtained from Patient RLE ulcer History of Present Illness (HPI) 02/16/2020 upon evaluation today patient actually appears to be doing poorly in regard to her left medial lower extremity ulcer. This is actually an area that she tells me she has had intermittent issues with over the years although has been closed for some time she typically uses compression right now she has juxta lite compression wraps. With that being said she tells me that this nonetheless open several weeks/months ago and has been given her trouble since. She does have a history of chronic venous insufficiency she is seeing specialist for this in the past she has had an ablation as well as sclerotherapy. With that being said she also has hypertension chronically which is managed by her primary care provider. In general she seems to be worsening overall with regard to the wound and states that she finally realized that she needed to come in and have somebody look at this and not continue to try to manage this on her own. No fevers, chills, nausea, vomiting, or diarrhea. 02/23/2020 on evaluation today patient appears to be doing well with regard to her wound. This is showing some signs of improvement which is great news still were not quite at the  point where I would like to be as far as the overall appearance of the wound is concerned but I do believe this is better than last week. I do believe the Iodoflex is helping as well. 03/08/2020 upon evaluation today patient appears to be doing well with regard to her wound. She has been tolerating the dressing changes without complication. Fortunately I feel like she has made great progress with the Iodoflex but I feel like it may be the point rest to switch to something else possibly a collagen- based dressing at this time. 03/15/2020 upon evaluation today patient appears to be doing excellent in regard to her leg ulcer. She has been tolerating the dressing changes without complication. Fortunately there is no signs of active infection. Overall she is measuring a little bit smaller today which is great news. 03/22/2020 upon evaluation today patient appears to be doing well with regard to her wound. She has been tolerating the dressing changes without complication. Fortunately there is no signs of active infection at this time. 03/28/2020; patient I do not usually see however she has a wound on the left anterior lower leg secondary to chronic venous insufficiency we have been using silver collagen under compression. She arrives in clinic with a nonviable surface requiring debridement 04/12/2020 upon evaluation today patient appears to be doing well all things considered with regard to her leg ulcer. She is tolerating the dressing changes without complication there is minimal dry skin around the edges of the wound that may be trapping and stopping some of the events of the new skin I am can work on that today. Otherwise the surface of the wound appears to be doing excellent. 04/19/2020 upon evaluation today patient appears to be doing well with regard to her leg ulcer. She has been tolerating dressing changes without complication. Fortunately there is no signs of active infection at this time. No fever  chills noted overall very pleased with how things seem to be progressing. 04/26/2020 on evaluation today patient  [address] [place], [name] 01/01/2023 3:30 PM Medical Record Number: [id number] Patient Account Number: [id number] Date of Birth/Sex: Treating RN: Feb 25, 1961 (62 y.o. [name] Primary Care Provider: Nira Conn Other Clinician: Referring Provider: Treating Provider/Extender: Andrey Campanile in Treatment: 21 History of Present Illness HPI Description: 02/16/2020 upon evaluation today patient actually appears to be doing poorly in regard to her left medial lower extremity ulcer. This is actually an area that she tells me she has had intermittent issues with over the years although has been closed for some time she typically uses compression right now she has juxta lite compression wraps. With that being said she tells me that this nonetheless open several weeks/months ago and has been given her trouble since. She does have a history of chronic venous insufficiency she is seeing specialist for this in the past she has had an ablation as well as sclerotherapy. With that being said she also has  hypertension chronically which is managed by her primary care provider. In general she seems to be worsening overall with regard to the wound and states that she finally realized that she needed to come in and have somebody look at this and not continue to try to manage this on her own. No fevers, chills, nausea, vomiting, or diarrhea. 02/23/2020 on evaluation today patient appears to be doing well with regard to her wound. This is showing some signs of improvement which is great news still were not quite at the point where I would like to be as far as the overall appearance of the wound is concerned but I do believe this is better than last week. I do believe the Iodoflex is helping as well. 03/08/2020 upon evaluation today patient appears to be doing well with regard to her wound. She has been tolerating the dressing changes without complication. Fortunately I feel like she has made great progress with the Iodoflex but I feel like it may be the point rest to switch to something else possibly a collagen- based dressing at this time. 03/15/2020 upon evaluation today patient appears to be doing excellent in regard to her leg ulcer. She has been tolerating the dressing changes without complication. Fortunately there is no signs of active infection. Overall she is measuring a little bit smaller today which is great news. 03/22/2020 upon evaluation today patient appears to be doing well with regard to her wound. She has been tolerating the dressing changes without complication. Fortunately there is no signs of active infection at this time. 03/28/2020; patient I do not usually see however she has a wound on the left anterior lower leg secondary to chronic venous insufficiency we have been using silver collagen under compression. She arrives in clinic with a nonviable surface requiring debridement 04/12/2020 upon evaluation today patient appears to be doing well all things considered with regard to her leg  ulcer. She is tolerating the dressing changes without complication there is minimal dry skin around the edges of the wound that may be trapping and stopping some of the events of the new skin I am can work on that today. Otherwise the surface of the wound appears to be doing excellent. 04/19/2020 upon evaluation today patient appears to be doing well with regard to her leg ulcer. She has been tolerating dressing changes without complication. Fortunately there is no signs of active infection at this time. No fever chills noted overall very pleased with how things seem to be progressing. 04/26/2020 on evaluation today patient appears  [address] [place], [name] 01/01/2023 3:30 PM Medical Record Number: [id number] Patient Account Number: [id number] Date of Birth/Sex: Treating RN: Feb 25, 1961 (62 y.o. [name] Primary Care Provider: Nira Conn Other Clinician: Referring Provider: Treating Provider/Extender: Andrey Campanile in Treatment: 21 History of Present Illness HPI Description: 02/16/2020 upon evaluation today patient actually appears to be doing poorly in regard to her left medial lower extremity ulcer. This is actually an area that she tells me she has had intermittent issues with over the years although has been closed for some time she typically uses compression right now she has juxta lite compression wraps. With that being said she tells me that this nonetheless open several weeks/months ago and has been given her trouble since. She does have a history of chronic venous insufficiency she is seeing specialist for this in the past she has had an ablation as well as sclerotherapy. With that being said she also has  hypertension chronically which is managed by her primary care provider. In general she seems to be worsening overall with regard to the wound and states that she finally realized that she needed to come in and have somebody look at this and not continue to try to manage this on her own. No fevers, chills, nausea, vomiting, or diarrhea. 02/23/2020 on evaluation today patient appears to be doing well with regard to her wound. This is showing some signs of improvement which is great news still were not quite at the point where I would like to be as far as the overall appearance of the wound is concerned but I do believe this is better than last week. I do believe the Iodoflex is helping as well. 03/08/2020 upon evaluation today patient appears to be doing well with regard to her wound. She has been tolerating the dressing changes without complication. Fortunately I feel like she has made great progress with the Iodoflex but I feel like it may be the point rest to switch to something else possibly a collagen- based dressing at this time. 03/15/2020 upon evaluation today patient appears to be doing excellent in regard to her leg ulcer. She has been tolerating the dressing changes without complication. Fortunately there is no signs of active infection. Overall she is measuring a little bit smaller today which is great news. 03/22/2020 upon evaluation today patient appears to be doing well with regard to her wound. She has been tolerating the dressing changes without complication. Fortunately there is no signs of active infection at this time. 03/28/2020; patient I do not usually see however she has a wound on the left anterior lower leg secondary to chronic venous insufficiency we have been using silver collagen under compression. She arrives in clinic with a nonviable surface requiring debridement 04/12/2020 upon evaluation today patient appears to be doing well all things considered with regard to her leg  ulcer. She is tolerating the dressing changes without complication there is minimal dry skin around the edges of the wound that may be trapping and stopping some of the events of the new skin I am can work on that today. Otherwise the surface of the wound appears to be doing excellent. 04/19/2020 upon evaluation today patient appears to be doing well with regard to her leg ulcer. She has been tolerating dressing changes without complication. Fortunately there is no signs of active infection at this time. No fever chills noted overall very pleased with how things seem to be progressing. 04/26/2020 on evaluation today patient appears  Week Discharge Instructions: May shower and wash wound with dial antibacterial soap and water prior to  dressing change. Cleanser: Wound Cleanser 1 x Per Week Discharge Instructions: Cleanse the wound with wound cleanser prior to applying a clean dressing using gauze sponges, not tissue or cotton balls. Peri-Wound Care: Triamcinolone 15 (g) 1 x Per Week Discharge Instructions: Use triamcinolone 15 (g) as directed Peri-Wound Care: Sween Lotion (Moisturizing lotion) 1 x Per Week Discharge Instructions: Apply moisturizing lotion as directed Topical: Gentamicin 1 x Per Week Discharge Instructions: As directed by physician Topical: Mupirocin Ointment 1 x Per Week Discharge Instructions: Apply Mupirocin (Bactroban) as instructed Prim Dressing: Hydrofera Blue Ready Transfer Foam, 2.5x2.5 (in/in) 1 x Per Week ary Discharge Instructions: Apply directly to wound bed as directed Secondary Dressing: Woven Gauze Sponges 2x2 in 1 x Per Week Discharge Instructions: Apply over primary dressing as directed. Compression Wrap: Urgo K2, (equivalent to a 4 layer) two layer compression system, regular 1 x Per Week Discharge Instructions: Apply Urgo K2 as directed (alternative to 4 layer compression). Wound #4 - Lower Leg Wound Laterality: Right, Posterior Cleanser: Soap and Water 1 x Per Week Discharge Instructions: May shower and wash wound with dial antibacterial soap and water prior to dressing change. Cleanser: Wound Cleanser 1 x Per Week Discharge Instructions: Cleanse the wound with wound cleanser prior to applying a clean dressing using gauze sponges, not tissue or cotton balls. Peri-Wound Care: Triamcinolone 15 (g) 1 x Per Week Discharge Instructions: Use triamcinolone 15 (g) as directed Peri-Wound Care: Sween Lotion (Moisturizing lotion) 1 x Per Week Discharge Instructions: Apply moisturizing lotion as directed Topical: Gentamicin 1 x Per Week Discharge Instructions: As directed by physician Topical: Mupirocin Ointment 1 x Per Week Discharge Instructions: Apply Mupirocin (Bactroban) as  instructed Prim Dressing: Hydrofera Blue Ready Transfer Foam, 2.5x2.5 (in/in) 1 x Per Week ary Discharge Instructions: Apply directly to wound bed as directed [name], [name] ([id number]) [phone].pdf Page 7 of 15 Secondary Dressing: Woven Gauze Sponges 2x2 in 1 x Per Week Discharge Instructions: Apply over primary dressing as directed. Compression Wrap: Urgo K2, (equivalent to a 4 layer) two layer compression system, regular 1 x Per Week Discharge Instructions: Apply Urgo K2 as directed (alternative to 4 layer compression). Electronic Signature(s) Signed: 01/01/2023 4:57:33 PM By: [name] MD FACS Previous Signature: 01/01/2023 4:19:45 PM Version By: [name] RN, BSN Entered By: [name] on 01/01/2023 16:54:05 -------------------------------------------------------------------------------- Problem List Details Patient Name: Date of Service: [name], [name] 01/01/2023 3:30 PM Medical Record Number: [id number] Patient Account Number: [id number] Date of Birth/Sex: Treating RN: 04-Nov-1960 (62 y.o. [name] Primary Care Provider: Nira Conn Other Clinician: Referring Provider: Treating Provider/Extender: Andrey Campanile in Treatment: 85 Active Problems ICD-10 Encounter Code Description Active Date MDM Diagnosis L97.222 Non-pressure chronic ulcer of left calf with fat layer exposed 08/02/2022 No Yes L97.212 Non-pressure chronic ulcer of right calf with fat layer exposed 12/12/2022 No Yes I89.0 Lymphedema, not elsewhere classified 01/23/2022 No Yes I87.2 Venous insufficiency (chronic) (peripheral) 01/23/2022 No Yes E66.01 Morbid (severe) obesity due to excess calories 01/23/2022 No Yes I10 Essential (primary) hypertension 01/23/2022 No Yes Inactive Problems Resolved Problems Electronic Signature(s) Signed: 01/01/2023 4:41:06 PM By: [name] MD FACS Previous Signature: 01/01/2023 4:19:45 PM  Version By: [name] RN, BSN Entered By: [name] on 01/01/2023 16:41:06 [name] ([id number]) [id number] Page 8 of 15 -------------------------------------------------------------------------------- Progress Note Details Patient Name: Date of Service: [place], [name] 01/01/2023 3:30  [address] [place], [name] 01/01/2023 3:30 PM Medical Record Number: [id number] Patient Account Number: [id number] Date of Birth/Sex: Treating RN: Feb 25, 1961 (62 y.o. [name] Primary Care Provider: Nira Conn Other Clinician: Referring Provider: Treating Provider/Extender: Andrey Campanile in Treatment: 21 History of Present Illness HPI Description: 02/16/2020 upon evaluation today patient actually appears to be doing poorly in regard to her left medial lower extremity ulcer. This is actually an area that she tells me she has had intermittent issues with over the years although has been closed for some time she typically uses compression right now she has juxta lite compression wraps. With that being said she tells me that this nonetheless open several weeks/months ago and has been given her trouble since. She does have a history of chronic venous insufficiency she is seeing specialist for this in the past she has had an ablation as well as sclerotherapy. With that being said she also has  hypertension chronically which is managed by her primary care provider. In general she seems to be worsening overall with regard to the wound and states that she finally realized that she needed to come in and have somebody look at this and not continue to try to manage this on her own. No fevers, chills, nausea, vomiting, or diarrhea. 02/23/2020 on evaluation today patient appears to be doing well with regard to her wound. This is showing some signs of improvement which is great news still were not quite at the point where I would like to be as far as the overall appearance of the wound is concerned but I do believe this is better than last week. I do believe the Iodoflex is helping as well. 03/08/2020 upon evaluation today patient appears to be doing well with regard to her wound. She has been tolerating the dressing changes without complication. Fortunately I feel like she has made great progress with the Iodoflex but I feel like it may be the point rest to switch to something else possibly a collagen- based dressing at this time. 03/15/2020 upon evaluation today patient appears to be doing excellent in regard to her leg ulcer. She has been tolerating the dressing changes without complication. Fortunately there is no signs of active infection. Overall she is measuring a little bit smaller today which is great news. 03/22/2020 upon evaluation today patient appears to be doing well with regard to her wound. She has been tolerating the dressing changes without complication. Fortunately there is no signs of active infection at this time. 03/28/2020; patient I do not usually see however she has a wound on the left anterior lower leg secondary to chronic venous insufficiency we have been using silver collagen under compression. She arrives in clinic with a nonviable surface requiring debridement 04/12/2020 upon evaluation today patient appears to be doing well all things considered with regard to her leg  ulcer. She is tolerating the dressing changes without complication there is minimal dry skin around the edges of the wound that may be trapping and stopping some of the events of the new skin I am can work on that today. Otherwise the surface of the wound appears to be doing excellent. 04/19/2020 upon evaluation today patient appears to be doing well with regard to her leg ulcer. She has been tolerating dressing changes without complication. Fortunately there is no signs of active infection at this time. No fever chills noted overall very pleased with how things seem to be progressing. 04/26/2020 on evaluation today patient appears  PM Medical Record Number: [id number] Patient Account Number: [id number] Date of Birth/Sex: Treating RN: Sep 08, 1960 (62 y.o. [name] Primary Care Provider: Nira Conn Other Clinician: Referring Provider: Treating Provider/Extender: Andrey Campanile in Treatment: 34 Subjective Chief Complaint Information obtained from Patient RLE ulcer History of Present Illness (HPI) 02/16/2020 upon evaluation today patient actually appears to be doing poorly in regard to her left medial lower extremity ulcer. This is actually an area that she tells me she has had intermittent issues with over the years although has been closed for some time she typically uses compression right now she has juxta lite compression wraps. With that being said she tells me that this nonetheless open several weeks/months ago and has been given her trouble since. She does have a history of chronic venous insufficiency she is seeing specialist for this in the past she has had an ablation as well as sclerotherapy. With that being said she also has hypertension chronically which is managed by her primary care provider. In general she seems to be worsening overall with regard to the wound and states that she finally realized that she needed to come in and have somebody look at this and not continue to try to manage this on her own. No fevers, chills, nausea, vomiting, or diarrhea. 02/23/2020 on evaluation today patient appears to be doing well with regard to her wound. This is showing some signs of improvement which is great news still were not quite at the  point where I would like to be as far as the overall appearance of the wound is concerned but I do believe this is better than last week. I do believe the Iodoflex is helping as well. 03/08/2020 upon evaluation today patient appears to be doing well with regard to her wound. She has been tolerating the dressing changes without complication. Fortunately I feel like she has made great progress with the Iodoflex but I feel like it may be the point rest to switch to something else possibly a collagen- based dressing at this time. 03/15/2020 upon evaluation today patient appears to be doing excellent in regard to her leg ulcer. She has been tolerating the dressing changes without complication. Fortunately there is no signs of active infection. Overall she is measuring a little bit smaller today which is great news. 03/22/2020 upon evaluation today patient appears to be doing well with regard to her wound. She has been tolerating the dressing changes without complication. Fortunately there is no signs of active infection at this time. 03/28/2020; patient I do not usually see however she has a wound on the left anterior lower leg secondary to chronic venous insufficiency we have been using silver collagen under compression. She arrives in clinic with a nonviable surface requiring debridement 04/12/2020 upon evaluation today patient appears to be doing well all things considered with regard to her leg ulcer. She is tolerating the dressing changes without complication there is minimal dry skin around the edges of the wound that may be trapping and stopping some of the events of the new skin I am can work on that today. Otherwise the surface of the wound appears to be doing excellent. 04/19/2020 upon evaluation today patient appears to be doing well with regard to her leg ulcer. She has been tolerating dressing changes without complication. Fortunately there is no signs of active infection at this time. No fever  chills noted overall very pleased with how things seem to be progressing. 04/26/2020 on evaluation today patient  [address] [place], [name] 01/01/2023 3:30 PM Medical Record Number: [id number] Patient Account Number: [id number] Date of Birth/Sex: Treating RN: Feb 25, 1961 (62 y.o. [name] Primary Care Provider: Nira Conn Other Clinician: Referring Provider: Treating Provider/Extender: Andrey Campanile in Treatment: 21 History of Present Illness HPI Description: 02/16/2020 upon evaluation today patient actually appears to be doing poorly in regard to her left medial lower extremity ulcer. This is actually an area that she tells me she has had intermittent issues with over the years although has been closed for some time she typically uses compression right now she has juxta lite compression wraps. With that being said she tells me that this nonetheless open several weeks/months ago and has been given her trouble since. She does have a history of chronic venous insufficiency she is seeing specialist for this in the past she has had an ablation as well as sclerotherapy. With that being said she also has  hypertension chronically which is managed by her primary care provider. In general she seems to be worsening overall with regard to the wound and states that she finally realized that she needed to come in and have somebody look at this and not continue to try to manage this on her own. No fevers, chills, nausea, vomiting, or diarrhea. 02/23/2020 on evaluation today patient appears to be doing well with regard to her wound. This is showing some signs of improvement which is great news still were not quite at the point where I would like to be as far as the overall appearance of the wound is concerned but I do believe this is better than last week. I do believe the Iodoflex is helping as well. 03/08/2020 upon evaluation today patient appears to be doing well with regard to her wound. She has been tolerating the dressing changes without complication. Fortunately I feel like she has made great progress with the Iodoflex but I feel like it may be the point rest to switch to something else possibly a collagen- based dressing at this time. 03/15/2020 upon evaluation today patient appears to be doing excellent in regard to her leg ulcer. She has been tolerating the dressing changes without complication. Fortunately there is no signs of active infection. Overall she is measuring a little bit smaller today which is great news. 03/22/2020 upon evaluation today patient appears to be doing well with regard to her wound. She has been tolerating the dressing changes without complication. Fortunately there is no signs of active infection at this time. 03/28/2020; patient I do not usually see however she has a wound on the left anterior lower leg secondary to chronic venous insufficiency we have been using silver collagen under compression. She arrives in clinic with a nonviable surface requiring debridement 04/12/2020 upon evaluation today patient appears to be doing well all things considered with regard to her leg  ulcer. She is tolerating the dressing changes without complication there is minimal dry skin around the edges of the wound that may be trapping and stopping some of the events of the new skin I am can work on that today. Otherwise the surface of the wound appears to be doing excellent. 04/19/2020 upon evaluation today patient appears to be doing well with regard to her leg ulcer. She has been tolerating dressing changes without complication. Fortunately there is no signs of active infection at this time. No fever chills noted overall very pleased with how things seem to be progressing. 04/26/2020 on evaluation today patient appears  [address] [place], [name] 01/01/2023 3:30 PM Medical Record Number: [id number] Patient Account Number: [id number] Date of Birth/Sex: Treating RN: Feb 25, 1961 (62 y.o. [name] Primary Care Provider: Nira Conn Other Clinician: Referring Provider: Treating Provider/Extender: Andrey Campanile in Treatment: 21 History of Present Illness HPI Description: 02/16/2020 upon evaluation today patient actually appears to be doing poorly in regard to her left medial lower extremity ulcer. This is actually an area that she tells me she has had intermittent issues with over the years although has been closed for some time she typically uses compression right now she has juxta lite compression wraps. With that being said she tells me that this nonetheless open several weeks/months ago and has been given her trouble since. She does have a history of chronic venous insufficiency she is seeing specialist for this in the past she has had an ablation as well as sclerotherapy. With that being said she also has  hypertension chronically which is managed by her primary care provider. In general she seems to be worsening overall with regard to the wound and states that she finally realized that she needed to come in and have somebody look at this and not continue to try to manage this on her own. No fevers, chills, nausea, vomiting, or diarrhea. 02/23/2020 on evaluation today patient appears to be doing well with regard to her wound. This is showing some signs of improvement which is great news still were not quite at the point where I would like to be as far as the overall appearance of the wound is concerned but I do believe this is better than last week. I do believe the Iodoflex is helping as well. 03/08/2020 upon evaluation today patient appears to be doing well with regard to her wound. She has been tolerating the dressing changes without complication. Fortunately I feel like she has made great progress with the Iodoflex but I feel like it may be the point rest to switch to something else possibly a collagen- based dressing at this time. 03/15/2020 upon evaluation today patient appears to be doing excellent in regard to her leg ulcer. She has been tolerating the dressing changes without complication. Fortunately there is no signs of active infection. Overall she is measuring a little bit smaller today which is great news. 03/22/2020 upon evaluation today patient appears to be doing well with regard to her wound. She has been tolerating the dressing changes without complication. Fortunately there is no signs of active infection at this time. 03/28/2020; patient I do not usually see however she has a wound on the left anterior lower leg secondary to chronic venous insufficiency we have been using silver collagen under compression. She arrives in clinic with a nonviable surface requiring debridement 04/12/2020 upon evaluation today patient appears to be doing well all things considered with regard to her leg  ulcer. She is tolerating the dressing changes without complication there is minimal dry skin around the edges of the wound that may be trapping and stopping some of the events of the new skin I am can work on that today. Otherwise the surface of the wound appears to be doing excellent. 04/19/2020 upon evaluation today patient appears to be doing well with regard to her leg ulcer. She has been tolerating dressing changes without complication. Fortunately there is no signs of active infection at this time. No fever chills noted overall very pleased with how things seem to be progressing. 04/26/2020 on evaluation today patient appears  Week Discharge Instructions: May shower and wash wound with dial antibacterial soap and water prior to  dressing change. Cleanser: Wound Cleanser 1 x Per Week Discharge Instructions: Cleanse the wound with wound cleanser prior to applying a clean dressing using gauze sponges, not tissue or cotton balls. Peri-Wound Care: Triamcinolone 15 (g) 1 x Per Week Discharge Instructions: Use triamcinolone 15 (g) as directed Peri-Wound Care: Sween Lotion (Moisturizing lotion) 1 x Per Week Discharge Instructions: Apply moisturizing lotion as directed Topical: Gentamicin 1 x Per Week Discharge Instructions: As directed by physician Topical: Mupirocin Ointment 1 x Per Week Discharge Instructions: Apply Mupirocin (Bactroban) as instructed Prim Dressing: Hydrofera Blue Ready Transfer Foam, 2.5x2.5 (in/in) 1 x Per Week ary Discharge Instructions: Apply directly to wound bed as directed Secondary Dressing: Woven Gauze Sponges 2x2 in 1 x Per Week Discharge Instructions: Apply over primary dressing as directed. Compression Wrap: Urgo K2, (equivalent to a 4 layer) two layer compression system, regular 1 x Per Week Discharge Instructions: Apply Urgo K2 as directed (alternative to 4 layer compression). Wound #4 - Lower Leg Wound Laterality: Right, Posterior Cleanser: Soap and Water 1 x Per Week Discharge Instructions: May shower and wash wound with dial antibacterial soap and water prior to dressing change. Cleanser: Wound Cleanser 1 x Per Week Discharge Instructions: Cleanse the wound with wound cleanser prior to applying a clean dressing using gauze sponges, not tissue or cotton balls. Peri-Wound Care: Triamcinolone 15 (g) 1 x Per Week Discharge Instructions: Use triamcinolone 15 (g) as directed Peri-Wound Care: Sween Lotion (Moisturizing lotion) 1 x Per Week Discharge Instructions: Apply moisturizing lotion as directed Topical: Gentamicin 1 x Per Week Discharge Instructions: As directed by physician Topical: Mupirocin Ointment 1 x Per Week Discharge Instructions: Apply Mupirocin (Bactroban) as  instructed Prim Dressing: Hydrofera Blue Ready Transfer Foam, 2.5x2.5 (in/in) 1 x Per Week ary Discharge Instructions: Apply directly to wound bed as directed [name], [name] ([id number]) [phone].pdf Page 7 of 15 Secondary Dressing: Woven Gauze Sponges 2x2 in 1 x Per Week Discharge Instructions: Apply over primary dressing as directed. Compression Wrap: Urgo K2, (equivalent to a 4 layer) two layer compression system, regular 1 x Per Week Discharge Instructions: Apply Urgo K2 as directed (alternative to 4 layer compression). Electronic Signature(s) Signed: 01/01/2023 4:57:33 PM By: [name] MD FACS Previous Signature: 01/01/2023 4:19:45 PM Version By: [name] RN, BSN Entered By: [name] on 01/01/2023 16:54:05 -------------------------------------------------------------------------------- Problem List Details Patient Name: Date of Service: [name], [name] 01/01/2023 3:30 PM Medical Record Number: [id number] Patient Account Number: [id number] Date of Birth/Sex: Treating RN: 04-Nov-1960 (62 y.o. [name] Primary Care Provider: Nira Conn Other Clinician: Referring Provider: Treating Provider/Extender: Andrey Campanile in Treatment: 85 Active Problems ICD-10 Encounter Code Description Active Date MDM Diagnosis L97.222 Non-pressure chronic ulcer of left calf with fat layer exposed 08/02/2022 No Yes L97.212 Non-pressure chronic ulcer of right calf with fat layer exposed 12/12/2022 No Yes I89.0 Lymphedema, not elsewhere classified 01/23/2022 No Yes I87.2 Venous insufficiency (chronic) (peripheral) 01/23/2022 No Yes E66.01 Morbid (severe) obesity due to excess calories 01/23/2022 No Yes I10 Essential (primary) hypertension 01/23/2022 No Yes Inactive Problems Resolved Problems Electronic Signature(s) Signed: 01/01/2023 4:41:06 PM By: [name] MD FACS Previous Signature: 01/01/2023 4:19:45 PM  Version By: [name] RN, BSN Entered By: [name] on 01/01/2023 16:41:06 [name] ([id number]) [id number] Page 8 of 15 -------------------------------------------------------------------------------- Progress Note Details Patient Name: Date of Service: [place], [name] 01/01/2023 3:30  [address] [place], [name] 01/01/2023 3:30 PM Medical Record Number: [id number] Patient Account Number: [id number] Date of Birth/Sex: Treating RN: Feb 25, 1961 (62 y.o. [name] Primary Care Provider: Nira Conn Other Clinician: Referring Provider: Treating Provider/Extender: Andrey Campanile in Treatment: 21 History of Present Illness HPI Description: 02/16/2020 upon evaluation today patient actually appears to be doing poorly in regard to her left medial lower extremity ulcer. This is actually an area that she tells me she has had intermittent issues with over the years although has been closed for some time she typically uses compression right now she has juxta lite compression wraps. With that being said she tells me that this nonetheless open several weeks/months ago and has been given her trouble since. She does have a history of chronic venous insufficiency she is seeing specialist for this in the past she has had an ablation as well as sclerotherapy. With that being said she also has  hypertension chronically which is managed by her primary care provider. In general she seems to be worsening overall with regard to the wound and states that she finally realized that she needed to come in and have somebody look at this and not continue to try to manage this on her own. No fevers, chills, nausea, vomiting, or diarrhea. 02/23/2020 on evaluation today patient appears to be doing well with regard to her wound. This is showing some signs of improvement which is great news still were not quite at the point where I would like to be as far as the overall appearance of the wound is concerned but I do believe this is better than last week. I do believe the Iodoflex is helping as well. 03/08/2020 upon evaluation today patient appears to be doing well with regard to her wound. She has been tolerating the dressing changes without complication. Fortunately I feel like she has made great progress with the Iodoflex but I feel like it may be the point rest to switch to something else possibly a collagen- based dressing at this time. 03/15/2020 upon evaluation today patient appears to be doing excellent in regard to her leg ulcer. She has been tolerating the dressing changes without complication. Fortunately there is no signs of active infection. Overall she is measuring a little bit smaller today which is great news. 03/22/2020 upon evaluation today patient appears to be doing well with regard to her wound. She has been tolerating the dressing changes without complication. Fortunately there is no signs of active infection at this time. 03/28/2020; patient I do not usually see however she has a wound on the left anterior lower leg secondary to chronic venous insufficiency we have been using silver collagen under compression. She arrives in clinic with a nonviable surface requiring debridement 04/12/2020 upon evaluation today patient appears to be doing well all things considered with regard to her leg  ulcer. She is tolerating the dressing changes without complication there is minimal dry skin around the edges of the wound that may be trapping and stopping some of the events of the new skin I am can work on that today. Otherwise the surface of the wound appears to be doing excellent. 04/19/2020 upon evaluation today patient appears to be doing well with regard to her leg ulcer. She has been tolerating dressing changes without complication. Fortunately there is no signs of active infection at this time. No fever chills noted overall very pleased with how things seem to be progressing. 04/26/2020 on evaluation today patient appears  Week Discharge Instructions: May shower and wash wound with dial antibacterial soap and water prior to  dressing change. Cleanser: Wound Cleanser 1 x Per Week Discharge Instructions: Cleanse the wound with wound cleanser prior to applying a clean dressing using gauze sponges, not tissue or cotton balls. Peri-Wound Care: Triamcinolone 15 (g) 1 x Per Week Discharge Instructions: Use triamcinolone 15 (g) as directed Peri-Wound Care: Sween Lotion (Moisturizing lotion) 1 x Per Week Discharge Instructions: Apply moisturizing lotion as directed Topical: Gentamicin 1 x Per Week Discharge Instructions: As directed by physician Topical: Mupirocin Ointment 1 x Per Week Discharge Instructions: Apply Mupirocin (Bactroban) as instructed Prim Dressing: Hydrofera Blue Ready Transfer Foam, 2.5x2.5 (in/in) 1 x Per Week ary Discharge Instructions: Apply directly to wound bed as directed Secondary Dressing: Woven Gauze Sponges 2x2 in 1 x Per Week Discharge Instructions: Apply over primary dressing as directed. Compression Wrap: Urgo K2, (equivalent to a 4 layer) two layer compression system, regular 1 x Per Week Discharge Instructions: Apply Urgo K2 as directed (alternative to 4 layer compression). Wound #4 - Lower Leg Wound Laterality: Right, Posterior Cleanser: Soap and Water 1 x Per Week Discharge Instructions: May shower and wash wound with dial antibacterial soap and water prior to dressing change. Cleanser: Wound Cleanser 1 x Per Week Discharge Instructions: Cleanse the wound with wound cleanser prior to applying a clean dressing using gauze sponges, not tissue or cotton balls. Peri-Wound Care: Triamcinolone 15 (g) 1 x Per Week Discharge Instructions: Use triamcinolone 15 (g) as directed Peri-Wound Care: Sween Lotion (Moisturizing lotion) 1 x Per Week Discharge Instructions: Apply moisturizing lotion as directed Topical: Gentamicin 1 x Per Week Discharge Instructions: As directed by physician Topical: Mupirocin Ointment 1 x Per Week Discharge Instructions: Apply Mupirocin (Bactroban) as  instructed Prim Dressing: Hydrofera Blue Ready Transfer Foam, 2.5x2.5 (in/in) 1 x Per Week ary Discharge Instructions: Apply directly to wound bed as directed [name], [name] ([id number]) [phone].pdf Page 7 of 15 Secondary Dressing: Woven Gauze Sponges 2x2 in 1 x Per Week Discharge Instructions: Apply over primary dressing as directed. Compression Wrap: Urgo K2, (equivalent to a 4 layer) two layer compression system, regular 1 x Per Week Discharge Instructions: Apply Urgo K2 as directed (alternative to 4 layer compression). Electronic Signature(s) Signed: 01/01/2023 4:57:33 PM By: [name] MD FACS Previous Signature: 01/01/2023 4:19:45 PM Version By: [name] RN, BSN Entered By: [name] on 01/01/2023 16:54:05 -------------------------------------------------------------------------------- Problem List Details Patient Name: Date of Service: [name], [name] 01/01/2023 3:30 PM Medical Record Number: [id number] Patient Account Number: [id number] Date of Birth/Sex: Treating RN: 04-Nov-1960 (62 y.o. [name] Primary Care Provider: Nira Conn Other Clinician: Referring Provider: Treating Provider/Extender: Andrey Campanile in Treatment: 85 Active Problems ICD-10 Encounter Code Description Active Date MDM Diagnosis L97.222 Non-pressure chronic ulcer of left calf with fat layer exposed 08/02/2022 No Yes L97.212 Non-pressure chronic ulcer of right calf with fat layer exposed 12/12/2022 No Yes I89.0 Lymphedema, not elsewhere classified 01/23/2022 No Yes I87.2 Venous insufficiency (chronic) (peripheral) 01/23/2022 No Yes E66.01 Morbid (severe) obesity due to excess calories 01/23/2022 No Yes I10 Essential (primary) hypertension 01/23/2022 No Yes Inactive Problems Resolved Problems Electronic Signature(s) Signed: 01/01/2023 4:41:06 PM By: [name] MD FACS Previous Signature: 01/01/2023 4:19:45 PM  Version By: [name] RN, BSN Entered By: [name] on 01/01/2023 16:41:06 [name] ([id number]) [id number] Page 8 of 15 -------------------------------------------------------------------------------- Progress Note Details Patient Name: Date of Service: [place], [name] 01/01/2023 3:30  Week Discharge Instructions: May shower and wash wound with dial antibacterial soap and water prior to  dressing change. Cleanser: Wound Cleanser 1 x Per Week Discharge Instructions: Cleanse the wound with wound cleanser prior to applying a clean dressing using gauze sponges, not tissue or cotton balls. Peri-Wound Care: Triamcinolone 15 (g) 1 x Per Week Discharge Instructions: Use triamcinolone 15 (g) as directed Peri-Wound Care: Sween Lotion (Moisturizing lotion) 1 x Per Week Discharge Instructions: Apply moisturizing lotion as directed Topical: Gentamicin 1 x Per Week Discharge Instructions: As directed by physician Topical: Mupirocin Ointment 1 x Per Week Discharge Instructions: Apply Mupirocin (Bactroban) as instructed Prim Dressing: Hydrofera Blue Ready Transfer Foam, 2.5x2.5 (in/in) 1 x Per Week ary Discharge Instructions: Apply directly to wound bed as directed Secondary Dressing: Woven Gauze Sponges 2x2 in 1 x Per Week Discharge Instructions: Apply over primary dressing as directed. Compression Wrap: Urgo K2, (equivalent to a 4 layer) two layer compression system, regular 1 x Per Week Discharge Instructions: Apply Urgo K2 as directed (alternative to 4 layer compression). Wound #4 - Lower Leg Wound Laterality: Right, Posterior Cleanser: Soap and Water 1 x Per Week Discharge Instructions: May shower and wash wound with dial antibacterial soap and water prior to dressing change. Cleanser: Wound Cleanser 1 x Per Week Discharge Instructions: Cleanse the wound with wound cleanser prior to applying a clean dressing using gauze sponges, not tissue or cotton balls. Peri-Wound Care: Triamcinolone 15 (g) 1 x Per Week Discharge Instructions: Use triamcinolone 15 (g) as directed Peri-Wound Care: Sween Lotion (Moisturizing lotion) 1 x Per Week Discharge Instructions: Apply moisturizing lotion as directed Topical: Gentamicin 1 x Per Week Discharge Instructions: As directed by physician Topical: Mupirocin Ointment 1 x Per Week Discharge Instructions: Apply Mupirocin (Bactroban) as  instructed Prim Dressing: Hydrofera Blue Ready Transfer Foam, 2.5x2.5 (in/in) 1 x Per Week ary Discharge Instructions: Apply directly to wound bed as directed [name], [name] ([id number]) [phone].pdf Page 7 of 15 Secondary Dressing: Woven Gauze Sponges 2x2 in 1 x Per Week Discharge Instructions: Apply over primary dressing as directed. Compression Wrap: Urgo K2, (equivalent to a 4 layer) two layer compression system, regular 1 x Per Week Discharge Instructions: Apply Urgo K2 as directed (alternative to 4 layer compression). Electronic Signature(s) Signed: 01/01/2023 4:57:33 PM By: [name] MD FACS Previous Signature: 01/01/2023 4:19:45 PM Version By: [name] RN, BSN Entered By: [name] on 01/01/2023 16:54:05 -------------------------------------------------------------------------------- Problem List Details Patient Name: Date of Service: [name], [name] 01/01/2023 3:30 PM Medical Record Number: [id number] Patient Account Number: [id number] Date of Birth/Sex: Treating RN: 04-Nov-1960 (62 y.o. [name] Primary Care Provider: Nira Conn Other Clinician: Referring Provider: Treating Provider/Extender: Andrey Campanile in Treatment: 85 Active Problems ICD-10 Encounter Code Description Active Date MDM Diagnosis L97.222 Non-pressure chronic ulcer of left calf with fat layer exposed 08/02/2022 No Yes L97.212 Non-pressure chronic ulcer of right calf with fat layer exposed 12/12/2022 No Yes I89.0 Lymphedema, not elsewhere classified 01/23/2022 No Yes I87.2 Venous insufficiency (chronic) (peripheral) 01/23/2022 No Yes E66.01 Morbid (severe) obesity due to excess calories 01/23/2022 No Yes I10 Essential (primary) hypertension 01/23/2022 No Yes Inactive Problems Resolved Problems Electronic Signature(s) Signed: 01/01/2023 4:41:06 PM By: [name] MD FACS Previous Signature: 01/01/2023 4:19:45 PM  Version By: [name] RN, BSN Entered By: [name] on 01/01/2023 16:41:06 [name] ([id number]) [id number] Page 8 of 15 -------------------------------------------------------------------------------- Progress Note Details Patient Name: Date of Service: [place], [name] 01/01/2023 3:30

## 2023-01-02 NOTE — Progress Notes (Signed)
HAARIKA, COMBEE ([id number]) [id number] Page 1 of 10 Visit Report for 01/01/2023 Arrival Information Details Patient Name: Date of Service: [place], [name] 01/01/2023 3:30 PM Medical Record Number: [id number] Patient Account Number: [id number] Date of Birth/Sex: Treating RN: 02/18/1961 (62 y.o. F) Primary Care Torunn Chancellor: Nira Conn Other Clinician: Referring Naftula Donahue: Treating Michaelle Bottomley/Extender: Andrey Campanile in Treatment: 60 Visit Information History Since Last Visit Added or deleted any medications: No Patient Arrived: Ambulatory Any new allergies or adverse reactions: No Arrival Time: 15:34 Had a fall or experienced change in No Accompanied By: self activities of daily living that may affect Transfer Assistance: None risk of falls: Patient Identification Verified: Yes Signs or symptoms of abuse/neglect since last visito No Secondary Verification Process Completed: Yes Hospitalized since last visit: No Patient Requires Transmission-Based Precautions: No Implantable device outside of the clinic excluding No Patient Has Alerts: No cellular tissue based products placed in the center since last visit: Has Dressing in Place as Prescribed: Yes Has Compression in Place as Prescribed: Yes Pain Present Now: No Electronic Signature(s) Signed: 01/01/2023 4:19:45 PM By: [name] RN, BSN Entered By: [name] on 01/01/2023 15:47:08 -------------------------------------------------------------------------------- Compression Therapy Details Patient Name: Date of Service: [name], [name] 01/01/2023 3:30 PM Medical Record Number: [id number] Patient Account Number: [id number] Date of Birth/Sex: Treating RN: [birth year] (62 y.o. [name] Primary Care Arihaan Bellucci: Nira Conn Other Clinician: Referring Kori Colin: Treating Norelle Runnion/Extender: Andrey Campanile in Treatment:  49 Compression Therapy Performed for Wound Assessment: Wound #3 Left,Posterior Lower Leg Performed By: Clinician [name], RN Compression Type: Double Layer Post Procedure Diagnosis Same as Pre-procedure Notes Urgo Electronic Signature(s) Signed: 01/01/2023 4:19:45 PM By: [name] RN, BSN Entered By: [name] on 01/01/2023 15:57:37 [name] ([id number]) [id number] Page 2 of 10 -------------------------------------------------------------------------------- Compression Therapy Details Patient Name: Date of Service: [name] 01/01/2023 3:30 PM Medical Record Number: [id number] Patient Account Number: [id number] Date of Birth/Sex: Treating RN: July 30, 1960 (62 y.o. [name] Primary Care Ardene Remley: Nira Conn Other Clinician: Referring Eoghan Belcher: Treating Taylen Wendland/Extender: Andrey Campanile in Treatment: 49 Compression Therapy Performed for Wound Assessment: Wound #4 Right,Posterior Lower Leg Performed By: Clinician [name], RN Compression Type: Double Layer Post Procedure Diagnosis Same as Pre-procedure Notes Urgo Electronic Signature(s) Signed: 01/01/2023 4:19:45 PM By: [name] RN, BSN Entered By: [name] on 01/01/2023 15:57:37 -------------------------------------------------------------------------------- Encounter Discharge Information Details Patient Name: Date of Service: [address] [name] 01/01/2023 3:30 PM Medical Record Number: [id number] Patient Account Number: [id number] Date of Birth/Sex: Treating RN: 08/24/1960 (62 y.o. [name] Primary Care Heru Montz: Nira Conn Other Clinician: Referring Rainelle Sulewski: Treating Deondra Labrador/Extender: Andrey Campanile in Treatment: 52 Encounter Discharge Information Items Post Procedure Vitals Discharge Condition: Stable Temperature (F): 98 Ambulatory Status:  Ambulatory Pulse (bpm): 101 Discharge Destination: Home Respiratory Rate (breaths/min): 20 Transportation: Private Auto Blood Pressure (mmHg): 124/89 Accompanied By: self Schedule Follow-up Appointment: Yes Clinical Summary of Care: Patient Declined Electronic Signature(s) Signed: 01/01/2023 4:19:45 PM By: [name] RN, BSN Entered By: [name] on 01/01/2023 16:19:21 -------------------------------------------------------------------------------- Lower Extremity Assessment Details Patient Name: Date of Service: [place], [name] 01/01/2023 3:30 PM Medical Record Number: [id number] Patient Account Number: [id number] Date of Birth/Sex: Treating RN: 1960/08/24 (62 y.o. [name] Primary Care Licia Harl: Nira Conn Other Clinician: Referring Florabelle Cardin: Treating Myracle Febres/Extender: Heinz Knuckles [place], Romie Minus ([id number]) [id number] Page 3 of 10 Weeks in Treatment:  49 Edema Assessment Assessed: [Left: No] [Right: No] Edema: [Left: Yes] [Right: Yes] Calf Left: Right: Point of Measurement: From Medial Instep 39.5 cm 44 cm Ankle Left: Right: Point of Measurement: From Medial Instep 23 cm 23 cm Vascular Assessment Pulses: Dorsalis Pedis Palpable: [Left:Yes] [Right:Yes] Extremity colors, hair growth, and conditions: Extremity Color: [Left:Hyperpigmented] [Right:Hyperpigmented] Hair Growth on Extremity: [Left:No] [Right:No] Temperature of Extremity: [Left:Warm < 3 seconds] [Right:Warm < 3 seconds] Electronic Signature(s) Signed: 01/01/2023 4:19:45 PM By: [name] RN, BSN Entered By: [name] on 01/01/2023 15:48:42 -------------------------------------------------------------------------------- Multi Wound Chart Details Patient Name: Date of Service: [name], [name] [name] 01/01/2023 3:30 PM Medical Record Number: [id number] Patient Account Number: [id number] Date of Birth/Sex: Treating  RN: [birth year] (62 y.o. [name], [name] Primary Care Jhett Fretwell: Nira Conn Other Clinician: Referring Devinn Voshell: Treating Ace Bergfeld/Extender: Andrey Campanile in Treatment: 45 Vital Signs Height(in): 61 Pulse(bpm): 101 Weight(lbs): 277 Blood Pressure(mmHg): 124/89 Body Mass Index(BMI): 52.3 Temperature(F): 98.0 Respiratory Rate(breaths/min): 18 [3:Photos:] [N/A:N/A] Left, Posterior Lower Leg Right, Posterior Lower Leg N/A Wound Location: Trauma Gradually Appeared N/A Wounding Event: Venous Leg Ulcer Lymphedema N/A Primary Etiology: N/A Venous Leg Ulcer N/A Secondary Etiology: Anemia, Sleep Apnea, Hypertension, Anemia, Sleep Apnea, Hypertension, N/A Comorbid History: Peripheral Venous Disease Peripheral Venous Disease 06/28/2022 12/07/2022 N/A Date Acquired: 84 2 N/A Tania Ade of TreatmentKATALEYAH, TINDELL ([id number]) [id number] Page 4 of 10 Open Open N/A Wound Status: No No N/A Wound Recurrence: 1.5x0.5x0.1 0.8x0.3x0.1 N/A Measurements L x W x D (cm) 0.589 0.188 N/A A (cm) : rea 0.059 0.019 N/A Volume (cm) : 55.60% 50.10% N/A % Reduction in Area: 55.60% 50.00% N/A % Reduction in Volume: Full Thickness Without Exposed Full Thickness Without Exposed N/A Classification: Support Structures Support Structures Small None Present N/A Exudate A mount: Serosanguineous N/A N/A Exudate Type: red, brown N/A N/A Exudate Color: Flat and Intact Flat and Intact N/A Wound Margin: Large (67-100%) None Present (0%) N/A Granulation A mount: Red N/A N/A Granulation Quality: Small (1-33%) None Present (0%) N/A Necrotic A mount: Eschar, Adherent Slough N/A N/A Necrotic Tissue: Fat Layer (Subcutaneous Tissue): Yes Fat Layer (Subcutaneous Tissue): Yes N/A Exposed Structures: Fascia: No Fascia: No Tendon: No Tendon: No Muscle: No Muscle: No Joint: No Joint: No Bone: No Bone: No Small (1-33%) Large (67-100%)  N/A Epithelialization: Debridement - Selective/Open Wound Debridement - Selective/Open Wound N/A Debridement: Pre-procedure Verification/Time Out 15:55 15:55 N/A Taken: Lidocaine 4% Topical Solution Lidocaine 4% Topical Solution N/A Pain Control: Necrotic/Eschar, Ambulance person N/A Tissue Debrided: Non-Viable Tissue Non-Viable Tissue N/A Level: 0.59 0.08 N/A Debridement A (sq cm): rea Curette Curette N/A Instrument: Minimum Minimum N/A Bleeding: Pressure Pressure N/A Hemostasis A chieved: 0 0 N/A Procedural Pain: 0 0 N/A Post Procedural Pain: Procedure was tolerated well Procedure was tolerated well N/A Debridement Treatment Response: 1.5x0.5x0.1 0.5x0.2x0.1 N/A Post Debridement Measurements L x W x D (cm) 0.059 0.008 N/A Post Debridement Volume: (cm) Rash: Yes No Abnormalities Noted N/A Periwound Skin Texture: No Abnormalities Noted No Abnormalities Noted N/A Periwound Skin Moisture: Erythema: No Erythema: No N/A Periwound Skin Color: No Abnormality No Abnormality N/A Temperature: Compression Therapy Compression Therapy N/A Procedures Performed: Debridement Debridement Treatment Notes Wound #3 (Lower Leg) Wound Laterality: Left, Posterior Cleanser Soap and Water Discharge Instruction: May shower and wash wound with dial antibacterial soap and water prior to dressing change. Wound Cleanser Discharge Instruction: Cleanse the wound with wound cleanser prior to applying a clean dressing using gauze sponges, not tissue or cotton balls. Peri-Wound Care  Triamcinolone 15 (g) Discharge Instruction: Use triamcinolone 15 (g) as directed Sween Lotion (Moisturizing lotion) Discharge Instruction: Apply moisturizing lotion as directed Topical Primary Dressing Hydrofera Blue Ready Transfer Foam, 2.5x2.5 (in/in) Discharge Instruction: Apply directly to wound bed as directed Secondary Dressing Woven Gauze Sponges 2x2 in Discharge Instruction: Apply over primary  dressing as directed. Secured With Compression Wrap Urgo K2, (equivalent to a 4 layer) two layer compression system, regular Discharge Instruction: Apply Urgo K2 as directed (alternative to 4 layer compression). Compression Stockings JOHANA, FEUSTEL ([id number]) [id number] Page 5 of 10 Add-Ons Wound #4 (Lower Leg) Wound Laterality: Right, Posterior Cleanser Soap and Water Discharge Instruction: May shower and wash wound with dial antibacterial soap and water prior to dressing change. Wound Cleanser Discharge Instruction: Cleanse the wound with wound cleanser prior to applying a clean dressing using gauze sponges, not tissue or cotton balls. Peri-Wound Care Triamcinolone 15 (g) Discharge Instruction: Use triamcinolone 15 (g) as directed Sween Lotion (Moisturizing lotion) Discharge Instruction: Apply moisturizing lotion as directed Topical Primary Dressing Hydrofera Blue Ready Transfer Foam, 2.5x2.5 (in/in) Discharge Instruction: Apply directly to wound bed as directed Secondary Dressing Woven Gauze Sponges 2x2 in Discharge Instruction: Apply over primary dressing as directed. Secured With Compression Wrap Urgo K2, (equivalent to a 4 layer) two layer compression system, regular Discharge Instruction: Apply Urgo K2 as directed (alternative to 4 layer compression). Compression Stockings Add-Ons Electronic Signature(s) Signed: 01/01/2023 4:47:35 PM By: [name] MD FACS Signed: 01/01/2023 5:26:50 PM By: [name] RN, BSN Entered By: [name] on 01/01/2023 16:47:35 -------------------------------------------------------------------------------- Multi-Disciplinary Care Plan Details Patient Name: Date of Service: [place], [name] 01/01/2023 3:30 PM Medical Record Number: [id number] Patient Account Number: [id number] Date of Birth/Sex: Treating RN: June 15, 1960 (62 y.o. [name] Primary Care Correne Lalani: Nira Conn Other  Clinician: Referring Keturah Yerby: Treating Vergia Chea/Extender: Andrey Campanile in Treatment: 49 Multidisciplinary Care Plan reviewed with physician Active Inactive Venous Leg Ulcer Nursing Diagnoses: Knowledge deficit related to disease process and management Potential for venous Insuffiency (use before diagnosis confirmed) Goals: Patient will maintain optimal edema control Date Initiated: 07/05/2022 Target Resolution Date: 01/29/2023 Goal Status: Active LAGENA, HABA ([id number]) [id number] Page 6 of 10 Interventions: Assess peripheral edema status every visit. Compression as ordered Provide education on venous insufficiency Treatment Activities: Therapeutic compression applied : 07/05/2022 Notes: Electronic Signature(s) Signed: 01/01/2023 4:19:45 PM By: [name] RN, BSN Entered By: [name] on 01/01/2023 15:51:33 -------------------------------------------------------------------------------- Pain Assessment Details Patient Name: Date of Service: [name], [name] 01/01/2023 3:30 PM Medical Record Number: [id number] Patient Account Number: [id number] Date of Birth/Sex: Treating RN: 1961-03-26 (63 y.o. F) Primary Care Naila Elizondo: Nira Conn Other Clinician: Referring Nealy Karapetian: Treating Namish Krise/Extender: Andrey Campanile in Treatment: 49 Active Problems Location of Pain Severity and Description of Pain Patient Has Paino No Site Locations Rate the pain. Current Pain Level: 0 Pain Management and Medication Current Pain Management: Electronic Signature(s) Signed: 01/01/2023 4:19:45 PM By: [name] RN, BSN Entered By: [name] on 01/01/2023 15:47:19 -------------------------------------------------------------------------------- Patient/Caregiver Education Details Patient Name: Date of Service: [name], [name] DA Rexene Edison 9/25/2024andnbsp3:30 PM Medical Record Number:  [id number] Patient Account Number: [id number] KIYARA, BORON ([id number]) [id number] Page 7 of 10 Date of Birth/Gender: Treating RN: [birth year] (62 y.o. [name] Primary Care Physician: Nira Conn Other Clinician: Referring Physician: Treating Physician/Extender: Andrey Campanile in Treatment: 29 Education Assessment Education Provided To: Patient Education Topics Provided Venous: Methods: Explain/Verbal Responses: Reinforcements needed, State  content correctly Electronic Signature(s) Signed: 01/01/2023 4:19:45 PM By: [name] RN, BSN Entered By: [name] on 01/01/2023 15:51:51 -------------------------------------------------------------------------------- Wound Assessment Details Patient Name: Date of Service: [place], [name] 01/01/2023 3:30 PM Medical Record Number: [id number] Patient Account Number: [id number] Date of Birth/Sex: Treating RN: February 12, 1961 (63 y.o. F) Primary Care Mattalynn Crandle: Nira Conn Other Clinician: Referring Jed Kutch: Treating Fenna Semel/Extender: Andrey Campanile in Treatment: 49 Wound Status Wound Number: 3 Primary Venous Leg Ulcer Etiology: Wound Location: Left, Posterior Lower Leg Wound Status: Open Wounding Event: Trauma Comorbid Anemia, Sleep Apnea, Hypertension, Peripheral Venous Date Acquired: 06/28/2022 History: Disease Weeks Of Treatment: 21 Clustered Wound: No Photos Wound Measurements Length: (cm) 1.5 Width: (cm) 0.5 Depth: (cm) 0.1 Area: (cm) 0.589 Volume: (cm) 0.059 % Reduction in Area: 55.6% % Reduction in Volume: 55.6% Epithelialization: Small (1-33%) Tunneling: No Undermining: No Wound Description Classification: Full Thickness Without Exposed Support Structures Wound Margin: Flat and Intact Exudate Amount: Small Exudate Type: Serosanguineous Exudate Color: red, brown TAIESHA, CARE ([id number]) Foul Odor After  Cleansing: No Slough/Fibrino Yes [id number] Page 8 of 10 Wound Bed Granulation Amount: Large (67-100%) Exposed Structure Granulation Quality: Red Fascia Exposed: No Necrotic Amount: Small (1-33%) Fat Layer (Subcutaneous Tissue) Exposed: Yes Necrotic Quality: Eschar, Adherent Slough Tendon Exposed: No Muscle Exposed: No Joint Exposed: No Bone Exposed: No Periwound Skin Texture Texture Color No Abnormalities Noted: Yes No Abnormalities Noted: Yes Moisture Temperature / Pain No Abnormalities Noted: Yes Temperature: No Abnormality Treatment Notes Wound #3 (Lower Leg) Wound Laterality: Left, Posterior Cleanser Soap and Water Discharge Instruction: May shower and wash wound with dial antibacterial soap and water prior to dressing change. Wound Cleanser Discharge Instruction: Cleanse the wound with wound cleanser prior to applying a clean dressing using gauze sponges, not tissue or cotton balls. Peri-Wound Care Triamcinolone 15 (g) Discharge Instruction: Use triamcinolone 15 (g) as directed Sween Lotion (Moisturizing lotion) Discharge Instruction: Apply moisturizing lotion as directed Topical Primary Dressing Hydrofera Blue Ready Transfer Foam, 2.5x2.5 (in/in) Discharge Instruction: Apply directly to wound bed as directed Secondary Dressing Woven Gauze Sponges 2x2 in Discharge Instruction: Apply over primary dressing as directed. Secured With Compression Wrap Urgo K2, (equivalent to a 4 layer) two layer compression system, regular Discharge Instruction: Apply Urgo K2 as directed (alternative to 4 layer compression). Compression Stockings Add-Ons Electronic Signature(s) Signed: 01/01/2023 4:19:45 PM By: [name] RN, BSN Entered By: [name] on 01/01/2023 15:49:16 -------------------------------------------------------------------------------- Wound Assessment Details Patient Name: Date of Service: [place], [name] 01/01/2023 3:30  PM Medical Record Number: [id number] Patient Account Number: [id number] Date of Birth/Sex: Treating RN: 1961-01-12 (63 y.o. F) Primary Care Kenae Lindquist: Nira Conn Other Clinician: Referring Cari Vandeberg: Treating Hagen Bohorquez/Extender: Andrey Campanile in Treatment: [address] [place], [place] ([id number]) [id number] Page 9 of 10 Wound Number: 4 Primary Etiology: Lymphedema Wound Location: Right, Posterior Lower Leg Secondary Venous Leg Ulcer Etiology: Wounding Event: Gradually Appeared Wound Status: Open Date Acquired: 12/07/2022 Comorbid History: Anemia, Sleep Apnea, Hypertension, Peripheral Venous Weeks Of Treatment: 2 Disease Clustered Wound: No Photos Wound Measurements Length: (cm) 0.8 Width: (cm) 0.3 Depth: (cm) 0.1 Area: (cm) 0.188 Volume: (cm) 0.019 % Reduction in Area: 50.1% % Reduction in Volume: 50% Epithelialization: Large (67-100%) Tunneling: No Undermining: No Wound Description Classification: Full Thickness Without Exposed Support Structures Wound Margin: Flat and Intact Exudate Amount: None Present Foul Odor After Cleansing: No Slough/Fibrino Yes Wound Bed Granulation Amount: None Present (0%) Exposed Structure Necrotic Amount: None Present (0%) Fascia  Exposed: No Fat Layer (Subcutaneous Tissue) Exposed: Yes Tendon Exposed: No Muscle Exposed: No Joint Exposed: No Bone Exposed: No Periwound Skin Texture Texture Color No Abnormalities Noted: Yes No Abnormalities Noted: Yes Moisture Temperature / Pain No Abnormalities Noted: Yes Temperature: No Abnormality Treatment Notes Wound #4 (Lower Leg) Wound Laterality: Right, Posterior Cleanser Soap and Water Discharge Instruction: May shower and wash wound with dial antibacterial soap and water prior to dressing change. Wound Cleanser Discharge Instruction: Cleanse the wound with wound cleanser prior to applying a clean dressing using gauze sponges,  not tissue or cotton balls. Peri-Wound Care Triamcinolone 15 (g) Discharge Instruction: Use triamcinolone 15 (g) as directed Sween Lotion (Moisturizing lotion) Discharge Instruction: Apply moisturizing lotion as directed Topical Primary Dressing Hydrofera Blue Ready Transfer Foam, 2.5x2.5 (in/in) Discharge Instruction: Apply directly to wound bed as directed Secondary Dressing Woven Gauze Sponges 2x2 in MAYMUNA, DIMMITT ([id number]) [phone] Page 10 of 10 Discharge Instruction: Apply over primary dressing as directed. Secured With Compression Wrap Urgo K2, (equivalent to a 4 layer) two layer compression system, regular Discharge Instruction: Apply Urgo K2 as directed (alternative to 4 layer compression). Compression Stockings Add-Ons Electronic Signature(s) Signed: 01/01/2023 4:19:45 PM By: [name] RN, BSN Entered By: [name] on 01/01/2023 15:50:05 -------------------------------------------------------------------------------- Vitals Details Patient Name: Date of Service: [name], [name] 01/01/2023 3:30 PM Medical Record Number: [id number] Patient Account Number: [id number] Date of Birth/Sex: Treating RN: [birth year] (62 y.o. F) Primary Care Sagan Maselli: Nira Conn Other Clinician: Referring Lisaanne Lawrie: Treating Devin Ganaway/Extender: Andrey Campanile in Treatment: 49 Vital Signs Time Taken: 03:35 Temperature (F): 98.0 Height (in): 61 Pulse (bpm): 101 Weight (lbs): 277 Respiratory Rate (breaths/min): 18 Body Mass Index (BMI): 52.3 Blood Pressure (mmHg): 124/89 Reference Range: 80 - 120 mg / dl Electronic Signature(s) Signed: 01/01/2023 4:19:45 PM By: [name] RN, BSN Entered By: [name] on 01/01/2023 15:47:12

## 2023-01-03 ENCOUNTER — Other Ambulatory Visit: Payer: Self-pay | Admitting: Family Medicine

## 2023-01-03 ENCOUNTER — Other Ambulatory Visit: Payer: Self-pay | Admitting: Pulmonary Disease

## 2023-01-03 DIAGNOSIS — I10 Essential (primary) hypertension: Secondary | ICD-10-CM

## 2023-01-05 NOTE — Telephone Encounter (Signed)
Needs appt with Dr. Vassie Loll - last seen by Dr. Vassie Loll in 2022.

## 2023-01-08 ENCOUNTER — Encounter (HOSPITAL_BASED_OUTPATIENT_CLINIC_OR_DEPARTMENT_OTHER): Payer: BC Managed Care – PPO | Attending: General Surgery | Admitting: General Surgery

## 2023-01-08 DIAGNOSIS — L97222 Non-pressure chronic ulcer of left calf with fat layer exposed: Secondary | ICD-10-CM | POA: Insufficient documentation

## 2023-01-08 DIAGNOSIS — I890 Lymphedema, not elsewhere classified: Secondary | ICD-10-CM | POA: Diagnosis not present

## 2023-01-08 DIAGNOSIS — Z6841 Body Mass Index (BMI) 40.0 and over, adult: Secondary | ICD-10-CM | POA: Diagnosis not present

## 2023-01-08 DIAGNOSIS — I10 Essential (primary) hypertension: Secondary | ICD-10-CM | POA: Diagnosis not present

## 2023-01-08 DIAGNOSIS — I872 Venous insufficiency (chronic) (peripheral): Secondary | ICD-10-CM | POA: Diagnosis not present

## 2023-01-08 DIAGNOSIS — L97212 Non-pressure chronic ulcer of right calf with fat layer exposed: Secondary | ICD-10-CM | POA: Insufficient documentation

## 2023-01-09 NOTE — Progress Notes (Signed)
[name], [name] ([id number]) [id number] Page 1 of 8 Visit Report for 01/08/2023 Arrival Information Details Patient Name: Date of Service: [name] [name] 01/08/2023 3:30 PM Medical Record Number: [id number] Patient Account Number: [id number] Date of Birth/Sex: Treating RN: April 08, 1961 (62 y.o. F) Primary Care [name]: [name] Other Clinician: Referring [name]: Treating [name]/Extender: [name] in Treatment: 50 Visit Information History Since Last Visit Added or deleted any medications: No Patient Arrived: Ambulatory Any new allergies or adverse reactions: No Arrival Time: 15:42 Had a fall or experienced change in No Accompanied By: self activities of daily living that may affect Transfer Assistance: None risk of falls: Patient Identification Verified: Yes Signs or symptoms of abuse/neglect since last visito No Secondary Verification Process Completed: Yes Hospitalized since last visit: No Patient Requires Transmission-Based Precautions: No Implantable device outside of the clinic excluding No Patient Has Alerts: No cellular tissue based products placed in the center since last visit: Has Dressing in Place as Prescribed: Yes Has Compression in Place as Prescribed: Yes Pain Present Now: No Electronic Signature(s) Signed: 01/08/2023 5:18:43 PM By: [name] RN, BSN Entered By: [name] on 01/08/2023 12:52:22 -------------------------------------------------------------------------------- Compression Therapy Details Patient Name: Date of Service: [name], [name] 01/08/2023 3:30 PM Medical Record Number: [id number] Patient Account Number: [id number] Date of Birth/Sex: Treating RN: [birth year] (62 y.o. [name] Primary Care Garold Sheeler: [name] Other Clinician: Referring [name]: Treating [name]/Extender: [name] in Treatment:  50 Compression Therapy Performed for Wound Assessment: Wound #3 Left,Posterior Lower Leg Performed By: Clinician [name], RN Compression Type: Double Layer Post Procedure Diagnosis Same as Pre-procedure Notes [name] Electronic Signature(s) Signed: 01/08/2023 5:18:43 PM By: [name] RN, BSN Entered By: [name] on 01/08/2023 13:09:13 [name] ([id number]) [id number] Page 2 of 8 -------------------------------------------------------------------------------- Encounter Discharge Information Details Patient Name: Date of Service: [name] 01/08/2023 3:30 PM Medical Record Number: [id number] Patient Account Number: [id number] Date of Birth/Sex: Treating RN: January 17, 1961 (62 y.o. [name] Primary Care [name]: [name] Other Clinician: Referring [name]: Treating [name]/Extender: [name] in Treatment: 50 Encounter Discharge Information Items Post Procedure Vitals Discharge Condition: Stable Temperature (F): 98 Ambulatory Status: Ambulatory Pulse (bpm): 97 Discharge Destination: Home Respiratory Rate (breaths/min): 18 Transportation: Private Auto Blood Pressure (mmHg): 155/66 Accompanied By: self Schedule Follow-up Appointment: Yes Clinical Summary of Care: Patient Declined Electronic Signature(s) Signed: 01/08/2023 5:18:43 PM By: [name] RN, BSN Entered By: [name] on 01/08/2023 13:28:28 -------------------------------------------------------------------------------- Lower Extremity Assessment Details Patient Name: Date of Service: [name], [name] [name] 01/08/2023 3:30 PM Medical Record Number: [id number] Patient Account Number: [id number] Date of Birth/Sex: Treating RN: Jun 20, 1960 (62 y.o. [name] Primary Care [name]: [name] Other Clinician: Referring [name]: Treating [name]/Extender: [name] in Treatment: 50 Edema Assessment Assessed: [Left: No] [Right: No] Edema: [Left: Yes] [Right: Yes] Calf Left: Right: Point of Measurement: From Medial Instep 38 cm 37 cm Ankle Left: Right: Point of Measurement: From Medial Instep 23 cm 23.5 cm Vascular Assessment Pulses: Dorsalis Pedis Palpable: [Left:Yes] [Right:Yes] Extremity colors, hair growth, and conditions: Extremity Color: [Left:Hyperpigmented] [Right:Hyperpigmented] Hair Growth on Extremity: [Left:No] [Right:No] Temperature of Extremity: [Left:Warm < 3 seconds] [Right:Warm < 3 seconds] Electronic Signature(s) Signed: 01/08/2023 5:18:43 PM By: [name] RN, BSN Entered By: [name] on 01/08/2023 12:56:10 [name] ([id number]) [id number] Page 3 of 8 -------------------------------------------------------------------------------- Multi Wound Chart Details Patient Name: Date of Service:  [address] [place], [name] [name] 01/08/2023 3:30 PM Medical Record Number: [id number] Patient Account Number: [id number] Date of Birth/Sex: Treating RN: December 14, 1960 (62 y.o. F) Primary Care [name]: [name] Other Clinician: Referring [name]: Treating Ellise Kovack/Extender: [name] in Treatment: 50 Vital Signs Height(in): 61 Pulse(bpm): 97 Weight(lbs): 277 Blood Pressure(mmHg): 155/66 Body Mass Index(BMI): 52.3 Temperature(F): 98.0 Respiratory Rate(breaths/min): 18 [3:Photos:] [N/A:N/A] Left, Posterior Lower Leg Right, Posterior Lower Leg N/A Wound Location: Trauma Gradually Appeared N/A Wounding Event: Venous Leg Ulcer Lymphedema N/A Primary Etiology: N/A Venous Leg Ulcer N/A Secondary Etiology: Anemia, Sleep Apnea, Hypertension, Anemia, Sleep Apnea, Hypertension, N/A Comorbid History: Peripheral Venous Disease Peripheral Venous Disease 06/28/2022 12/07/2022 N/A Date Acquired: 22 3 N/A Weeks of Treatment: Open Open N/A Wound Status: No  No N/A Wound Recurrence: 1.2x0.5x0.1 0x0x0 N/A Measurements L x W x D (cm) 0.471 0 N/A A (cm) : rea 0.047 0 N/A Volume (cm) : 64.50% 100.00% N/A % Reduction in A rea: 64.70% 100.00% N/A % Reduction in Volume: Full Thickness Without Exposed Full Thickness Without Exposed N/A Classification: Support Structures Support Structures Small None Present N/A Exudate A mount: Serosanguineous N/A N/A Exudate Type: red, brown N/A N/A Exudate Color: Flat and Intact N/A N/A Wound Margin: Medium (34-66%) None Present (0%) N/A Granulation A mount: Red N/A N/A Granulation Quality: Medium (34-66%) None Present (0%) N/A Necrotic A mount: Fat Layer (Subcutaneous Tissue): Yes Fascia: No N/A Exposed Structures: Fascia: No Fat Layer (Subcutaneous Tissue): No Tendon: No Tendon: No Muscle: No Muscle: No Joint: No Joint: No Bone: No Bone: No Medium (34-66%) Large (67-100%) N/A Epithelialization: Debridement - Selective/Open Wound N/A N/A Debridement: Pre-procedure Verification/Time Out 16:05 N/A N/A Taken: Lidocaine 4% Topical Solution N/A N/A Pain Control: Slough N/A N/A Tissue Debrided: Non-Viable Tissue N/A N/A Level: 0.47 N/A N/A Debridement A (sq cm): rea Curette N/A N/A Instrument: Minimum N/A N/A Bleeding: Pressure N/A N/A Hemostasis A chieved: 0 N/A N/A Procedural Pain: 0 N/A N/A Post Procedural Pain: Procedure was tolerated well N/A N/A Debridement Treatment ResponseCECILY, LAWHORNE ([id number]) [id number].pdf Page 4 of 8 1.2x0.5x0.1 N/A N/A Post Debridement Measurements L x W x D (cm) 0.047 N/A N/A Post Debridement Volume: (cm) Rash: Yes No Abnormalities Noted N/A Periwound Skin Texture: No Abnormalities Noted No Abnormalities Noted N/A Periwound Skin Moisture: Erythema: No Erythema: No N/A Periwound Skin Color: No Abnormality No Abnormality N/A Temperature: Compression Therapy N/A N/A Procedures  Performed: Debridement Treatment Notes Electronic Signature(s) Signed: 01/08/2023 4:23:46 PM By: [name] MD FACS Entered By: [name] on 01/08/2023 13:23:46 -------------------------------------------------------------------------------- Multi-Disciplinary Care Plan Details Patient Name: Date of Service: [place], [name] [name] 01/08/2023 3:30 PM Medical Record Number: [id number] Patient Account Number: [id number] Date of Birth/Sex: Treating RN: 1960-10-09 (62 y.o. [name] Primary Care Rhyanna Sorce: [name] Other Clinician: Referring Deano Tomaszewski: Treating Zaila Crew/Extender: [name] in Treatment: 50 Multidisciplinary Care Plan reviewed with physician Active Inactive Venous Leg Ulcer Nursing Diagnoses: Knowledge deficit related to disease process and management Potential for venous Insuffiency (use before diagnosis confirmed) Goals: Patient will maintain optimal edema control Date Initiated: 07/05/2022 Target Resolution Date: 01/29/2023 Goal Status: Active Interventions: Assess peripheral edema status every visit. Compression as ordered Provide education on venous insufficiency Treatment Activities: Therapeutic compression applied : 07/05/2022 Notes: Electronic Signature(s) Signed: 01/08/2023 5:18:43 PM By: [name] RN, BSN Entered By: [name] on 01/08/2023 13:01:37 -------------------------------------------------------------------------------- Pain Assessment Details Patient Name: Date of Service: [name], [name] 01/08/2023 3:30 PM Medical Record  Number: [id number] Patient Account Number: [id number] GEARLDEAN, LOMANTO ([id number]) [phone].pdf Page 5 of 8 Date of Birth/Sex: Treating RN: 04-30-1960 (62 y.o. F) Primary Care Amellia Panik: Other Clinician: Nira [name] Referring Surabhi Gadea: Treating Trexton Escamilla/Extender: [name] in Treatment:  50 Active Problems Location of Pain Severity and Description of Pain Patient Has Paino No Site Locations Rate the pain. Current Pain Level: 0 Pain Management and Medication Current Pain Management: Electronic Signature(s) Signed: 01/08/2023 5:18:43 PM By: [name] RN, BSN Entered By: [name] on 01/08/2023 12:52:41 -------------------------------------------------------------------------------- Patient/Caregiver Education Details Patient Name: Date of Service: [name], Vivi Ferns 10/2/2024andnbsp3:30 PM Medical Record Number: [id number] Patient Account Number: [id number] Date of Birth/Gender: Treating RN: 08-27-1960 (62 y.o. [name] Primary Care Physician: [name] Other Clinician: Referring Physician: Treating Physician/Extender: [name] in Treatment: 50 Education Assessment Education Provided To: Patient Education Topics Provided Venous: Methods: Explain/Verbal Responses: Reinforcements needed, State content correctly Electronic Signature(s) Signed: 01/08/2023 5:18:43 PM By: [name] RN, BSN Entered By: [name] on 01/08/2023 13:02:10 [name] ([id number]) [id number] Page 6 of 8 -------------------------------------------------------------------------------- Wound Assessment Details Patient Name: Date of Service: [name] 01/08/2023 3:30 PM Medical Record Number: [id number] Patient Account Number: [id number] Date of Birth/Sex: Treating RN: [birth year] (62 y.o. [name] Primary Care Bion Todorov: [name] Other Clinician: Referring Shanti Agresti: Treating Eileen Croswell/Extender: [name] in Treatment: 50 Wound Status Wound Number: 3 Primary Venous Leg Ulcer Etiology: Wound Location: Left, Posterior Lower Leg Wound Status: Open Wounding Event: Trauma Comorbid Anemia, Sleep Apnea, Hypertension, Peripheral  Venous Date Acquired: 06/28/2022 History: Disease Weeks Of Treatment: 22 Clustered Wound: No Photos Wound Measurements Length: (cm) 1.2 Width: (cm) 0.5 Depth: (cm) 0.1 Area: (cm) 0.471 Volume: (cm) 0.047 % Reduction in Area: 64.5% % Reduction in Volume: 64.7% Epithelialization: Medium (34-66%) Tunneling: No Undermining: No Wound Description Classification: Full Thickness Without Exposed Support Structures Wound Margin: Flat and Intact Exudate Amount: Small Exudate Type: Serosanguineous Exudate Color: red, brown Foul Odor After Cleansing: No Slough/Fibrino Yes Wound Bed Granulation Amount: Medium (34-66%) Exposed Structure Granulation Quality: Red Fascia Exposed: No Necrotic Amount: Medium (34-66%) Fat Layer (Subcutaneous Tissue) Exposed: Yes Necrotic Quality: Adherent Slough Tendon Exposed: No Muscle Exposed: No Joint Exposed: No Bone Exposed: No Periwound Skin Texture Texture Color No Abnormalities Noted: Yes No Abnormalities Noted: Yes Moisture Temperature / Pain No Abnormalities Noted: Yes Temperature: No Abnormality Treatment Notes Wound #3 (Lower Leg) Wound Laterality: Left, Posterior Cleanser Soap and Water Discharge Instruction: May shower and wash wound with dial antibacterial soap and water prior to dressing change. Wound Cleanser Discharge Instruction: Cleanse the wound with wound cleanser prior to applying a clean dressing using gauze sponges, not tissue or cotton balls. MACIAH, FEEBACK ([id number]) [id number] Page 7 of 8 Peri-Wound Care Triamcinolone 15 (g) Discharge Instruction: Use triamcinolone 15 (g) as directed Sween Lotion (Moisturizing lotion) Discharge Instruction: Apply moisturizing lotion as directed Topical Gentamicin Discharge Instruction: As directed by physician Mupirocin Ointment Discharge Instruction: Apply Mupirocin (Bactroban) as instructed Primary Dressing Hydrofera Blue Ready Transfer Foam,  2.5x2.5 (in/in) Discharge Instruction: Apply directly to wound bed as directed Secondary Dressing Woven Gauze Sponges 2x2 in Discharge Instruction: Apply over primary dressing as directed. Secured With Compression Wrap Urgo K2, (equivalent to a 4 layer) two layer compression system, regular Discharge Instruction: Apply Urgo K2 as directed (alternative to 4 layer compression). Compression Stockings Add-Ons Electronic Signature(s) Signed: 01/08/2023 5:18:43 PM By: [name] RN, BSN Entered  By: [name] on 01/08/2023 13:00:55 -------------------------------------------------------------------------------- Wound Assessment Details Patient Name: Date of Service: [name] 01/08/2023 3:30 PM Medical Record Number: [id number] Patient Account Number: [id number] Date of Birth/Sex: Treating RN: May 24, 1960 (62 y.o. [name] Primary Care Amori Colomb: [name] Other Clinician: Referring Vasil Juhasz: Treating Antionetta Ator/Extender: [name] in Treatment: 50 Wound Status Wound Number: 4 Primary Etiology: Lymphedema Wound Location: Right, Posterior Lower Leg Secondary Venous Leg Ulcer Etiology: Wounding Event: Gradually Appeared Wound Status: Open Date Acquired: 12/07/2022 Comorbid History: Anemia, Sleep Apnea, Hypertension, Peripheral Venous Weeks Of Treatment: 3 Disease Clustered Wound: No Photos AILANY, KOREN ([id number]) [phone].pdf Page 8 of 8 Wound Measurements Length: (cm) Width: (cm) Depth: (cm) Area: (cm) Volume: (cm) 0 % Reduction in Area: 100% 0 % Reduction in Volume: 100% 0 Epithelialization: Large (67-100%) 0 Tunneling: No 0 Undermining: No Wound Description Classification: Full Thickness Without Exposed Support Structures Exudate Amount: None Present Foul Odor After Cleansing: No Slough/Fibrino No Wound Bed Granulation Amount: None Present (0%) Exposed Structure Necrotic  Amount: None Present (0%) Fascia Exposed: No Fat Layer (Subcutaneous Tissue) Exposed: No Tendon Exposed: No Muscle Exposed: No Joint Exposed: No Bone Exposed: No Periwound Skin Texture Texture Color No Abnormalities Noted: Yes No Abnormalities Noted: Yes Moisture Temperature / Pain No Abnormalities Noted: Yes Temperature: No Abnormality Electronic Signature(s) Signed: 01/08/2023 5:18:43 PM By: [name] RN, BSN Entered By: [name] on 01/08/2023 13:00:22 -------------------------------------------------------------------------------- Vitals Details Patient Name: Date of Service: [name], [name] 01/08/2023 3:30 PM Medical Record Number: [id number] Patient Account Number: [id number] Date of Birth/Sex: Treating RN: 1961-03-31 (62 y.o. F) Primary Care Najia Hurlbutt: [name] Other Clinician: Referring Yennifer Segovia: Treating Regene Mccarthy/Extender: [name] in Treatment: 50 Vital Signs Time Taken: 03:43 Temperature (F): 98.0 Height (in): 61 Pulse (bpm): 97 Weight (lbs): 277 Respiratory Rate (breaths/min): 18 Body Mass Index (BMI): 52.3 Blood Pressure (mmHg): 155/66 Reference Range: 80 - 120 mg / dl Electronic Signature(s) Signed: 01/08/2023 5:18:43 PM By: [name] RN, BSN Entered By: [name] on 01/08/2023 12:52:30

## 2023-01-09 NOTE — Progress Notes (Signed)
MAEBRY, OBRIEN ([id number]) [id number] Page 1 of 13 Visit Report for 01/08/2023 Chief Complaint Document Details Patient Name: Date of Service: [name] 01/08/2023 3:30 PM Medical Record Number: [id number] Patient Account Number: [id number] Date of Birth/Sex: Treating RN: [birth year] (62 y.o. F) Primary Care Provider: Nira Conn Other Clinician: Referring Provider: Treating Provider/Extender: Andrey Campanile in Treatment: 50 Information Obtained from: Patient Chief Complaint RLE ulcer Electronic Signature(s) Signed: 01/08/2023 4:23:54 PM By: [name] MD FACS Entered By: [name] on 01/08/2023 13:23:54 -------------------------------------------------------------------------------- Debridement Details Patient Name: Date of Service: [name], [name] 01/08/2023 3:30 PM Medical Record Number: [id number] Patient Account Number: [id number] Date of Birth/Sex: Treating RN: October 20, 1960 (62 y.o. [name] Primary Care Provider: Nira Conn Other Clinician: Referring Provider: Treating Provider/Extender: Andrey Campanile in Treatment: 50 Debridement Performed for Assessment: Wound #3 Left,Posterior Lower Leg Performed By: Physician [name], MD The following information was scribed by: [name] The information was scribed for: [name] Debridement Type: Debridement Severity of Tissue Pre Debridement: Fat layer exposed Level of Consciousness (Pre-procedure): Awake and Alert Pre-procedure Verification/Time Out Yes - 16:05 Taken: Start Time: 16:06 Pain Control: Lidocaine 4% T opical Solution Percent of Wound Bed Debrided: 100% T Area Debrided (cm): otal 0.47 Tissue and other material debrided: Non-Viable, Slough, Slough Level: Non-Viable Tissue Debridement Description: Selective/Open Wound Instrument: Curette Bleeding: Minimum Hemostasis  Achieved: Pressure Procedural Pain: 0 Post Procedural Pain: 0 Response to Treatment: Procedure was tolerated well Level of Consciousness (Post- Awake and Alert procedure): Post Debridement Measurements of Total Wound Length: (cm) 1.2 Width: (cm) 0.5 Depth: (cm) 0.1 Volume: (cm) 0.047 BERNISHA, VERMA H ([id number]) [id number] Page 2 of 13 Character of Wound/Ulcer Post Debridement: Improved Severity of Tissue Post Debridement: Fat layer exposed Post Procedure Diagnosis Same as Pre-procedure Electronic Signature(s) Signed: 01/08/2023 4:32:07 PM By: [name] MD FACS Signed: 01/08/2023 5:18:43 PM By: [name] RN, BSN Entered By: [name] on 01/08/2023 13:11:00 -------------------------------------------------------------------------------- HPI Details Patient Name: Date of Service: [name], [name] 01/08/2023 3:30 PM Medical Record Number: [id number] Patient Account Number: [id number] Date of Birth/Sex: Treating RN: 1960-08-21 (61 y.o. F) Primary Care Provider: Nira Conn Other Clinician: Referring Provider: Treating Provider/Extender: Andrey Campanile in Treatment: 50 History of Present Illness HPI Description: 02/16/2020 upon evaluation today patient actually appears to be doing poorly in regard to her left medial lower extremity ulcer. This is actually an area that she tells me she has had intermittent issues with over the years although has been closed for some time she typically uses compression right now she has juxta lite compression wraps. With that being said she tells me that this nonetheless open several weeks/months ago and has been given her trouble since. She does have a history of chronic venous insufficiency she is seeing specialist for this in the past she has had an ablation as well as sclerotherapy. With that being said she also has hypertension chronically which is managed by her primary  care provider. In general she seems to be worsening overall with regard to the wound and states that she finally realized that she needed to come in and have somebody look at this and not continue to try to manage this on her own. No fevers, chills, nausea, vomiting, or diarrhea. 02/23/2020 on evaluation today patient appears to be doing well with regard to her wound. This is showing some signs of improvement which is great news still were  not quite at the point where I would like to be as far as the overall appearance of the wound is concerned but I do believe this is better than last week. I do believe the Iodoflex is helping as well. 03/08/2020 upon evaluation today patient appears to be doing well with regard to her wound. She has been tolerating the dressing changes without complication. Fortunately I feel like she has made great progress with the Iodoflex but I feel like it may be the point rest to switch to something else possibly a collagen- based dressing at this time. 03/15/2020 upon evaluation today patient appears to be doing excellent in regard to her leg ulcer. She has been tolerating the dressing changes without complication. Fortunately there is no signs of active infection. Overall she is measuring a little bit smaller today which is great news. 03/22/2020 upon evaluation today patient appears to be doing well with regard to her wound. She has been tolerating the dressing changes without complication. Fortunately there is no signs of active infection at this time. 03/28/2020; patient I do not usually see however she has a wound on the left anterior lower leg secondary to chronic venous insufficiency we have been using silver collagen under compression. She arrives in clinic with a nonviable surface requiring debridement 04/12/2020 upon evaluation today patient appears to be doing well all things considered with regard to her leg ulcer. She is tolerating the dressing changes  without complication there is minimal dry skin around the edges of the wound that may be trapping and stopping some of the events of the new skin I am can work on that today. Otherwise the surface of the wound appears to be doing excellent. 04/19/2020 upon evaluation today patient appears to be doing well with regard to her leg ulcer. She has been tolerating dressing changes without complication. Fortunately there is no signs of active infection at this time. No fever chills noted overall very pleased with how things seem to be progressing. 04/26/2020 on evaluation today patient appears to be doing well with regard to her wound currently. Is showing signs of excellent improvement overall is filling in nicely and there does not appear to be any signs of infection. No fevers, chills, nausea, vomiting, or diarrhea. 05/03/2020 upon evaluation today patient appears to be doing well with regard to her wound on the leg. This overall showing signs of good improvement which is great she has some good epithelial growth and overall I think that things are moving in the correct direction. We likewise going to continue with the wound care measures as before since she seems to making such good improvement. 2/3; venous wound on the left medial leg. This is contracting. We are using Prisma and 3 layer compression. She has a stocking and waiting in the eventuality this heals. She is already using it on the right 05/17/2020 upon evaluation today patient appears to be doing well with regard to her leg ulcer. She has been tolerating the dressing changes without complication. Fortunately there is no signs of active infection which is great news and overall very pleased with where things stand today. No fevers, chills, nausea, vomiting, or diarrhea. 05/24/2020 upon evaluation today patient appears to be doing well with regard to her wound. Overall I feel like she is making excellent progress. There does not appear to be any  signs of active infection which is great news. 05/31/2020 upon evaluation today patient appears to be doing well with regard to her wound. There  does not appear to be any signs of active infection which is great news overall I am extremely pleased with where things stand today. READMISSION 01/23/2022 She returns to clinic today with a new wound on her right posterior calf. She says that she was cleaning out an old shed near the middle of August this year and [name], [name] ([id number]) [phone].pdf Page 3 of 13 then noticed what seemed to be a bug bite on her right posterior calf. It was itchy, red, and raised. By the end of September, an ulcer had developed. She has been applying various topical creams such as hydrocortisone and others to the site. When it was not improving, she made an appointment in the wound care center. ABI in clinic today was 0.97. On her right posterior calf, there is a circular wound with necrotic fat and black eschar present. There is no purulent drainage or malodor. The periwound skin is in good condition with just a little induration that appears to be secondary to inflammation. 01/31/2022: The wound measures a little bit larger today, but overall is quite a bit cleaner. There is some undermining from 9:00 to 3:00. She is having some periwound itching and says that her wrap slid. 02/08/2022: Despite using an Unna boot first layer at the top of her wrap, it slid again and it looks like there is been some bruising at the wound site. The wound is about the same size in terms of dimensions. There is a fair amount of slough and nonviable tissue still present. Edema control is better than last week. 02/15/2022: The wound dimensions are about the same. There is less nonviable tissue present. The periwound erythema has improved. 02/22/2022: The wound has deteriorated over the past week. It is larger and the periwound is more edematous, erythematous, and  indurated. It looks as though there has been more tissue breakdown with undermining present. She is having more pain. There is a foul odor coming from the wound. 02/26/2022: The wound looks quite a bit better today and the odor is gone. I still do not have her culture data back, but she seems to be responding well to the Augmentin. 03/06/2022: Her wound continues to improve. There is still some slough accumulation, but the periwound skin is less inflamed. Her culture returned with a polymicrobial population including Pseudomonas and so levofloxacin was also prescribed. She did not understand why a second antibiotic was being added so she has not yet initiated this. 03/14/2022: The wound is about the same, to perhaps a little bit larger. There is a fair amount of slough accumulation on the surface, as well as some hypertrophic granulation tissue. She is still taking levofloxacin, but has completed taking Augmentin. She has her Jodie Echevaria compound with her today. 12/14; the patient has a significant circular wound on the posterior right calf. She is using Keystone and silver alginate under 3 layer compression. Her ABIs were within normal limits at 0.97. This may have been traumatic or an insect bite at the start I reviewed these records. 04/04/2022: Since I last saw the wound, it has contracted considerably. There is a fairly thick layer of slough on the surface, as it has not been debrided since the last time I did it. Edema control is excellent and the periwound skin is in much better condition. 04/12/2022: No significant change in the wound dimensions. It is filling with granulation tissue. Still with slough accumulation on the surface. 04/19/2022: The wound is smaller this week. There is some slough  accumulation on the surface. 04/26/2022: The wound is smaller again this week and significantly cleaner. The wound surface is a little bit drier than ideal. 05/03/2022: The wound measurements were about the  same, but visually it appears smaller. There is still some undermining at the top of the wound. Moisture balance is better this week. 05/10/2022: The wound measured smaller today. There is still a fair amount of undermining present. Slough has built up on the surface. We are still awaiting snap VAC approval. 05/17/2022: The wound is a little bit smaller today. There is less slough on the surface, but the granulation tissue still is not very robust. She has been approved for snap VAC but will have a 20% coinsurance and it is not clear what that amount would end up being for her. 05/27/2022: The surface of the wound has deteriorated and it is gray and fibrotic. No significant odor, but the drainage on her dressing was a little bit purulent. 05/31/2022: The wound looks quite a bit better today. It is still a little fibrotic but no longer has purulent-looking drainage. The color is better. She spoke with her insurance company and is interested in trying the snap VAC, now that she is aware of the cost to her. 06/07/2022: After 1 week in the snap VAC, there has been substantial improvement to her wound. The undermined portion is closing in. The surface has a healthier color and appearance. There is very minimal slough accumulation. 06/14/2022: The more shallow part of the undermined portion of her wound has closed. There is still some undermining from about 1-2 o'clock. The surface continues to improve. Minimal slough accumulation. 06/28/2022: The wound is shallower this week and the undermining has essentially closed and completely. The surface tissue is improving. 07/05/2022: The depth is almost immeasurable and the surface is nearly flush with the surrounding skin. The undermining has been eliminated. There is light slough on the wound surface. 07/12/2022: There is a little bit of slough on the wound surface. The depth is about the same as last week. 07/19/2022: The wound is essentially flush with the surrounding  skin surface. There is slight slough present. No real change in the AP or transverse dimensions. 07/26/2022: The wound measured a little bit smaller today. It is flush with the surrounding skin surface and there is good granulation tissue present. Minimal slough and biofilm accumulation. 08/02/2022: The right posterior leg wound is a little bit smaller. There is a little slough accumulation. She reported a new wound to [place] today. It is on her left posterior calf. It has been present for about 6 weeks. She is not sure of how it occurred. She has been trying to manage it at home with topical Neosporin and Band-Aids. The fat layer is exposed. The surface is fibrotic and covered with slough. 08/09/2022: The right posterior leg wound is smaller again this week. There is good granulation tissue on the surface with minimal slough accumulation. The left posterior calf wound has built up a thick layer of slough. It is still quite fibrotic underneath the slough, but there is a little bit of a pink color beginning to emerge. Edema control is good. 08/16/2022: Both wounds are smaller this week. There is minimal slough on the right posterior leg wound. There is slough that has reaccumulated on the left posterior calf wound but there is a little bit more granulation tissue emerging. 08/23/2022: Both wounds are about the same size this week, but the quality of the tissue and amount of  granulation tissue on the left are both better. 08/30/2022: Both wounds measure smaller today, but there has been some tissue breakdown adjacent to the left leg wound. She says it has been itchy. There is a little bit of slough on the surface of both wounds. 09/06/2022: The right wound measures smaller today. There is minimal slough on the wound surface. The left wound is about the same size. The periwound tissue looks better this week. There is still a layer of fibrinous slough on the left wound. Edema control is good bilaterally. 6/7 the  patient has wounds on bilateral calfs. We are using silver alginate on the right Iodoflex on the left under Urgo lite compression. The wounds are measuring smaller. [name], [name] ([id number]) [id number] Page 4 of 13 09/20/2022: The right wound is flush with the surrounding skin. There is good granulation tissue with some biofilm and thin eschar present. On the left, there is some slough accumulation and the patient says it has been a little itchy this week. 09/27/2022: No significant change to the right wound. The left wound measures a little smaller, but still has some depth to it with slough accumulation. 10/04/2022: Nice improvement in both wounds. They are smaller and more superficial. The left wound has better granulation tissue and no longer has the hard fibrous surface. 10/11/2022: Both wounds continue to contract. There is minimal slough and eschar present. Granulation tissue continues to fill in on the left. Edema control is good. 10/21/2022: The right leg wound is about half the size as it was last week. There is a little perimeter eschar and some light slough on the surface. The left leg wound is about the same size, but shallower. There is still slough accumulation but the buds of granulation tissue are getting larger. Edema control remains excellent. 10/30/2022: The right leg wound is healed. The left leg wound is smaller with better granulation tissue and only a little bit of slough present. Edema control is excellent. 11/07/2022: The left leg wound continues to contract and the surface continues to improve. Minimal slough with good granulation tissue filling in. 11/13/2022: Most of the left leg wound has epithelialized. There is a small open area remaining that is about a millimeter in each dimension. There is slough accumulation present. 11/20/2022: There is a little bit of slough on the wound surface. It is about the same size as last week. Edema control is  good. 11/27/2022: A tiny opening remains with slough on the surface. Edema control remains excellent. 12/05/2022: A small opening remains underneath some eschar and slough. 12/12/2022: No difference in the wound on her left leg. Unfortunately, over the weekend, her right leg wound reopened. It is a little over a centimeter in diameter and very superficial with some slough on the surface. 12/18/2022: Both wounds are larger today. There is increased slough on both surfaces, as well. Although edema control seems adequate, she is on her feet quite a bit more due to returning to school and being on her feet much of the day. 12/25/2022: The left calf wound is stable, but the right calf wound is considerably smaller. Both have a little bit of slough present. Edema control is adequate. 01/01/2023: Both wounds are smaller today. The right calf wound is nearly closed under some eschar. Edema control is adequate. 01/08/2023: The right calf wound is healed. The wound is smaller and more superficial. Edema control is good. She did receive the second sleeve so now she has bilateral lymphedema pumps and she has  been using them. Electronic Signature(s) Signed: 01/08/2023 4:24:50 PM By: [name] MD FACS Entered By: [name] on 01/08/2023 13:24:50 -------------------------------------------------------------------------------- Physical Exam Details Patient Name: Date of Service: [name], [name] 01/08/2023 3:30 PM Medical Record Number: [id number] Patient Account Number: [id number] Date of Birth/Sex: Treating RN: October 04, 1960 (62 y.o. F) Primary Care Provider: Nira Conn Other Clinician: Referring Provider: Treating Provider/Extender: Andrey Campanile in Treatment: 50 Constitutional Hypertensive, asymptomatic. . . . no acute distress. Respiratory Normal work of breathing on room air. Notes 01/08/2023: The right calf wound is healed. The wound is smaller and more  superficial. Edema control is good. Electronic Signature(s) Signed: 01/08/2023 4:25:27 PM By: [name] MD FACS Entered By: [name] on 01/08/2023 13:25:27 [name] ([id number]) [id number] Page 5 of 13 -------------------------------------------------------------------------------- Physician Orders Details Patient Name: Date of Service: [name] 01/08/2023 3:30 PM Medical Record Number: [id number] Patient Account Number: [id number] Date of Birth/Sex: Treating RN: May 03, 1960 (62 y.o. [name] Primary Care Provider: Nira Conn Other Clinician: Referring Provider: Treating Provider/Extender: Andrey Campanile in Treatment: 50 The following information was scribed by: [name] The information was scribed for: [name] Verbal / Phone Orders: No Diagnosis Coding ICD-10 Coding Code Description [phone] Non-pressure chronic ulcer of left calf with fat layer exposed L97.212 Non-pressure chronic ulcer of right calf with fat layer exposed I89.0 Lymphedema, not elsewhere classified I87.2 Venous insufficiency (chronic) (peripheral) E66.01 Morbid (severe) obesity due to excess calories I10 Essential (primary) hypertension Follow-up Appointments ppointment in 1 week. - Dr. Lady Gary - room 1 Return A Wed 10/9 @ 3:30 pm Anesthetic (In clinic) Topical Lidocaine 4% applied to wound bed Bathing/ Shower/ Hygiene May shower with protection but do not get wound dressing(s) wet. Protect dressing(s) with water repellant cover (for example, large plastic bag) or a cast cover and may then take shower. Edema Control - Lymphedema / SCD / Other Lymphedema Pumps. Use Lymphedema pumps on leg(s) 2-3 times a day for 45-60 minutes. If wearing any wraps or hose, do not remove them. Continue exercising as instructed. Elevate legs to the level of the heart or above for 30 minutes daily and/or when sitting  for 3-4 times a day throughout the day. Avoid standing for long periods of time. Exercise regularly Moisturize legs daily. Compression stocking or Garment 20-30 mm/Hg pressure to: - right leg daily Wound Treatment Wound #3 - Lower Leg Wound Laterality: Left, Posterior Cleanser: Soap and Water 1 x Per Week Discharge Instructions: May shower and wash wound with dial antibacterial soap and water prior to dressing change. Cleanser: Wound Cleanser 1 x Per Week Discharge Instructions: Cleanse the wound with wound cleanser prior to applying a clean dressing using gauze sponges, not tissue or cotton balls. Peri-Wound Care: Triamcinolone 15 (g) 1 x Per Week Discharge Instructions: Use triamcinolone 15 (g) as directed Peri-Wound Care: Sween Lotion (Moisturizing lotion) 1 x Per Week Discharge Instructions: Apply moisturizing lotion as directed Topical: Gentamicin 1 x Per Week Discharge Instructions: As directed by physician Topical: Mupirocin Ointment 1 x Per Week Discharge Instructions: Apply Mupirocin (Bactroban) as instructed Prim Dressing: Hydrofera Blue Ready Transfer Foam, 2.5x2.5 (in/in) 1 x Per Week ary Discharge Instructions: Apply directly to wound bed as directed Secondary Dressing: Woven Gauze Sponges 2x2 in 1 x Per Week Discharge Instructions: Apply over primary dressing as directed. Compression Wrap: Urgo K2, (equivalent to a 4 layer) two layer compression system, regular 1 x Per Week Standish, Alfa H (  [id number]) [id number] Page 6 of 13 Discharge Instructions: Apply Urgo K2 as directed (alternative to 4 layer compression). Electronic Signature(s) Signed: 01/08/2023 4:32:07 PM By: [name] MD FACS Entered By: [name] on 01/08/2023 13:25:58 -------------------------------------------------------------------------------- Problem List Details Patient Name: Date of Service: [name], [name] 01/08/2023 3:30 PM Medical Record Number:  [id number] Patient Account Number: [id number] Date of Birth/Sex: Treating RN: 10/15/1960 (62 y.o. [name] Primary Care Provider: Nira Conn Other Clinician: Referring Provider: Treating Provider/Extender: Andrey Campanile in Treatment: 50 Active Problems ICD-10 Encounter Code Description Active Date MDM Diagnosis L97.222 Non-pressure chronic ulcer of left calf with fat layer exposed 08/02/2022 No Yes I89.0 Lymphedema, not elsewhere classified 01/23/2022 No Yes I87.2 Venous insufficiency (chronic) (peripheral) 01/23/2022 No Yes E66.01 Morbid (severe) obesity due to excess calories 01/23/2022 No Yes I10 Essential (primary) hypertension 01/23/2022 No Yes Inactive Problems ICD-10 Code Description Active Date Inactive Date L97.212 Non-pressure chronic ulcer of right calf with fat layer exposed 12/12/2022 01/23/2022 Resolved Problems Electronic Signature(s) Signed: 01/08/2023 4:23:38 PM By: [name] MD FACS Entered By: [name] on 01/08/2023 13:23:37 [name] ([id number]) [id number] Page 7 of 13 -------------------------------------------------------------------------------- Progress Note Details Patient Name: Date of Service: [name] 01/08/2023 3:30 PM Medical Record Number: [id number] Patient Account Number: [id number] Date of Birth/Sex: Treating RN: 08-27-1960 (62 y.o. F) Primary Care Provider: Nira Conn Other Clinician: Referring Provider: Treating Provider/Extender: Andrey Campanile in Treatment: 50 Subjective Chief Complaint Information obtained from Patient RLE ulcer History of Present Illness (HPI) 02/16/2020 upon evaluation today patient actually appears to be doing poorly in regard to her left medial lower extremity ulcer. This is actually an area that she tells me she has had intermittent issues with over the years although has been closed  for some time she typically uses compression right now she has juxta lite compression wraps. With that being said she tells me that this nonetheless open several weeks/months ago and has been given her trouble since. She does have a history of chronic venous insufficiency she is seeing specialist for this in the past she has had an ablation as well as sclerotherapy. With that being said she also has hypertension chronically which is managed by her primary care provider. In general she seems to be worsening overall with regard to the wound and states that she finally realized that she needed to come in and have somebody look at this and not continue to try to manage this on her own. No fevers, chills, nausea, vomiting, or diarrhea. 02/23/2020 on evaluation today patient appears to be doing well with regard to her wound. This is showing some signs of improvement which is great news still were not quite at the point where I would like to be as far as the overall appearance of the wound is concerned but I do believe this is better than last week. I do believe the Iodoflex is helping as well. 03/08/2020 upon evaluation today patient appears to be doing well with regard to her wound. She has been tolerating the dressing changes without complication. Fortunately I feel like she has made great progress with the Iodoflex but I feel like it may be the point rest to switch to something else possibly a collagen- based dressing at this time. 03/15/2020 upon evaluation today patient appears to be doing excellent in regard to her leg ulcer. She has been tolerating the dressing changes without complication. Fortunately there is no signs of active  infection. Overall she is measuring a little bit smaller today which is great news. 03/22/2020 upon evaluation today patient appears to be doing well with regard to her wound. She has been tolerating the dressing changes without complication. Fortunately there is no signs of  active infection at this time. 03/28/2020; patient I do not usually see however she has a wound on the left anterior lower leg secondary to chronic venous insufficiency we have been using silver collagen under compression. She arrives in clinic with a nonviable surface requiring debridement 04/12/2020 upon evaluation today patient appears to be doing well all things considered with regard to her leg ulcer. She is tolerating the dressing changes without complication there is minimal dry skin around the edges of the wound that may be trapping and stopping some of the events of the new skin I am can work on that today. Otherwise the surface of the wound appears to be doing excellent. 04/19/2020 upon evaluation today patient appears to be doing well with regard to her leg ulcer. She has been tolerating dressing changes without complication. Fortunately there is no signs of active infection at this time. No fever chills noted overall very pleased with how things seem to be progressing. 04/26/2020 on evaluation today patient appears to be doing well with regard to her wound currently. Is showing signs of excellent improvement overall is filling in nicely and there does not appear to be any signs of infection. No fevers, chills, nausea, vomiting, or diarrhea. 05/03/2020 upon evaluation today patient appears to be doing well with regard to her wound on the leg. This overall showing signs of good improvement which is great she has some good epithelial growth and overall I think that things are moving in the correct direction. We likewise going to continue with the wound care measures as before since she seems to making such good improvement. 2/3; venous wound on the left medial leg. This is contracting. We are using Prisma and 3 layer compression. She has a stocking and waiting in the eventuality this heals. She is already using it on the right 05/17/2020 upon evaluation today patient appears to be doing well with  regard to her leg ulcer. She has been tolerating the dressing changes without complication. Fortunately there is no signs of active infection which is great news and overall very pleased with where things stand today. No fevers, chills, nausea, vomiting, or diarrhea. 05/24/2020 upon evaluation today patient appears to be doing well with regard to her wound. Overall I feel like she is making excellent progress. There does not appear to be any signs of active infection which is great news. 05/31/2020 upon evaluation today patient appears to be doing well with regard to her wound. There does not appear to be any signs of active infection which is great news overall I am extremely pleased with where things stand today. READMISSION 01/23/2022 She returns to clinic today with a new wound on her right posterior calf. She says that she was cleaning out an old shed near the middle of August this year and then noticed what seemed to be a bug bite on her right posterior calf. It was itchy, red, and raised. By the end of September, an ulcer had developed. She has been applying various topical creams such as hydrocortisone and others to the site. When it was not improving, she made an appointment in the wound care center. ABI in clinic today was 0.97. On her right posterior calf, there is a circular wound  with necrotic fat and black eschar present. There is no purulent drainage or malodor. The periwound skin is in good condition with just a little induration that appears to be secondary to inflammation. 01/31/2022: The wound measures a little bit larger today, but overall is quite a bit cleaner. There is some undermining from 9:00 to 3:00. She is having some periwound itching and says that her wrap slid. 02/08/2022: Despite using an Unna boot first layer at the top of her wrap, it slid again and it looks like there is been some bruising at the wound site. The wound is about the same size in terms of dimensions.  There is a fair amount of slough and nonviable tissue still present. Edema control is better than last week. 02/15/2022: The wound dimensions are about the same. There is less nonviable tissue present. The periwound erythema has improved. 02/22/2022: The wound has deteriorated over the past week. It is larger and the periwound is more edematous, erythematous, and indurated. It looks as though there has been more tissue breakdown with undermining present. She is having more pain. There is a foul odor coming from the wound. 02/26/2022: The wound looks quite a bit better today and the odor is gone. I still do not have her culture data back, but she seems to be responding well to the Augmentin. [name], [name] ([id number]) [id number] Page 8 of 13 03/06/2022: Her wound continues to improve. There is still some slough accumulation, but the periwound skin is less inflamed. Her culture returned with a polymicrobial population including Pseudomonas and so levofloxacin was also prescribed. She did not understand why a second antibiotic was being added so she has not yet initiated this. 03/14/2022: The wound is about the same, to perhaps a little bit larger. There is a fair amount of slough accumulation on the surface, as well as some hypertrophic granulation tissue. She is still taking levofloxacin, but has completed taking Augmentin. She has her Jodie Echevaria compound with her today. 12/14; the patient has a significant circular wound on the posterior right calf. She is using Keystone and silver alginate under 3 layer compression. Her ABIs were within normal limits at 0.97. This may have been traumatic or an insect bite at the start I reviewed these records. 04/04/2022: Since I last saw the wound, it has contracted considerably. There is a fairly thick layer of slough on the surface, as it has not been debrided since the last time I did it. Edema control is excellent and the periwound  skin is in much better condition. 04/12/2022: No significant change in the wound dimensions. It is filling with granulation tissue. Still with slough accumulation on the surface. 04/19/2022: The wound is smaller this week. There is some slough accumulation on the surface. 04/26/2022: The wound is smaller again this week and significantly cleaner. The wound surface is a little bit drier than ideal. 05/03/2022: The wound measurements were about the same, but visually it appears smaller. There is still some undermining at the top of the wound. Moisture balance is better this week. 05/10/2022: The wound measured smaller today. There is still a fair amount of undermining present. Slough has built up on the surface. We are still awaiting snap VAC approval. 05/17/2022: The wound is a little bit smaller today. There is less slough on the surface, but the granulation tissue still is not very robust. She has been approved for snap VAC but will have a 20% coinsurance and it is not clear what that amount would  end up being for her. 05/27/2022: The surface of the wound has deteriorated and it is gray and fibrotic. No significant odor, but the drainage on her dressing was a little bit purulent. 05/31/2022: The wound looks quite a bit better today. It is still a little fibrotic but no longer has purulent-looking drainage. The color is better. She spoke with her insurance company and is interested in trying the snap VAC, now that she is aware of the cost to her. 06/07/2022: After 1 week in the snap VAC, there has been substantial improvement to her wound. The undermined portion is closing in. The surface has a healthier color and appearance. There is very minimal slough accumulation. 06/14/2022: The more shallow part of the undermined portion of her wound has closed. There is still some undermining from about 1-2 o'clock. The surface continues to improve. Minimal slough accumulation. 06/28/2022: The wound is shallower this week  and the undermining has essentially closed and completely. The surface tissue is improving. 07/05/2022: The depth is almost immeasurable and the surface is nearly flush with the surrounding skin. The undermining has been eliminated. There is light slough on the wound surface. 07/12/2022: There is a little bit of slough on the wound surface. The depth is about the same as last week. 07/19/2022: The wound is essentially flush with the surrounding skin surface. There is slight slough present. No real change in the AP or transverse dimensions. 07/26/2022: The wound measured a little bit smaller today. It is flush with the surrounding skin surface and there is good granulation tissue present. Minimal slough and biofilm accumulation. 08/02/2022: The right posterior leg wound is a little bit smaller. There is a little slough accumulation. She reported a new wound to [place] today. It is on her left posterior calf. It has been present for about 6 weeks. She is not sure of how it occurred. She has been trying to manage it at home with topical Neosporin and Band-Aids. The fat layer is exposed. The surface is fibrotic and covered with slough. 08/09/2022: The right posterior leg wound is smaller again this week. There is good granulation tissue on the surface with minimal slough accumulation. The left posterior calf wound has built up a thick layer of slough. It is still quite fibrotic underneath the slough, but there is a little bit of a pink color beginning to emerge. Edema control is good. 08/16/2022: Both wounds are smaller this week. There is minimal slough on the right posterior leg wound. There is slough that has reaccumulated on the left posterior calf wound but there is a little bit more granulation tissue emerging. 08/23/2022: Both wounds are about the same size this week, but the quality of the tissue and amount of granulation tissue on the left are both better. 08/30/2022: Both wounds measure smaller today, but  there has been some tissue breakdown adjacent to the left leg wound. She says it has been itchy. There is a little bit of slough on the surface of both wounds. 09/06/2022: The right wound measures smaller today. There is minimal slough on the wound surface. The left wound is about the same size. The periwound tissue looks better this week. There is still a layer of fibrinous slough on the left wound. Edema control is good bilaterally. 6/7 the patient has wounds on bilateral calfs. We are using silver alginate on the right Iodoflex on the left under Urgo lite compression. The wounds are measuring smaller. 09/20/2022: The right wound is flush with the surrounding  skin. There is good granulation tissue with some biofilm and thin eschar present. On the left, there is some slough accumulation and the patient says it has been a little itchy this week. 09/27/2022: No significant change to the right wound. The left wound measures a little smaller, but still has some depth to it with slough accumulation. 10/04/2022: Nice improvement in both wounds. They are smaller and more superficial. The left wound has better granulation tissue and no longer has the hard fibrous surface. 10/11/2022: Both wounds continue to contract. There is minimal slough and eschar present. Granulation tissue continues to fill in on the left. Edema control is good. 10/21/2022: The right leg wound is about half the size as it was last week. There is a little perimeter eschar and some light slough on the surface. The left leg wound is about the same size, but shallower. There is still slough accumulation but the buds of granulation tissue are getting larger. Edema control remains excellent. 10/30/2022: The right leg wound is healed. The left leg wound is smaller with better granulation tissue and only a little bit of slough present. Edema control is excellent. 11/07/2022: The left leg wound continues to contract and the surface continues to  improve. Minimal slough with good granulation tissue filling in. [name], [name] ([id number]) [id number] Page 9 of 13 11/13/2022: Most of the left leg wound has epithelialized. There is a small open area remaining that is about a millimeter in each dimension. There is slough accumulation present. 11/20/2022: There is a little bit of slough on the wound surface. It is about the same size as last week. Edema control is good. 11/27/2022: A tiny opening remains with slough on the surface. Edema control remains excellent. 12/05/2022: A small opening remains underneath some eschar and slough. 12/12/2022: No difference in the wound on her left leg. Unfortunately, over the weekend, her right leg wound reopened. It is a little over a centimeter in diameter and very superficial with some slough on the surface. 12/18/2022: Both wounds are larger today. There is increased slough on both surfaces, as well. Although edema control seems adequate, she is on her feet quite a bit more due to returning to school and being on her feet much of the day. 12/25/2022: The left calf wound is stable, but the right calf wound is considerably smaller. Both have a little bit of slough present. Edema control is adequate. 01/01/2023: Both wounds are smaller today. The right calf wound is nearly closed under some eschar. Edema control is adequate. 01/08/2023: The right calf wound is healed. The wound is smaller and more superficial. Edema control is good. She did receive the second sleeve so now she has bilateral lymphedema pumps and she has been using them. Patient History Information obtained from Patient. Family History Cancer - Mother, Diabetes - Father, Hypertension - Mother, Kidney Disease - Mother, Lung Disease - Father, Thyroid Problems - Paternal Grandparents, No family history of Heart Disease, Seizures, Stroke, Tuberculosis. Social History Never smoker, Marital Status - Married, Alcohol Use -  Never, Drug Use - No History, Caffeine Use - Daily - coffee. Medical History Eyes Denies history of Cataracts, Glaucoma, Optic Neuritis Ear/Nose/Mouth/Throat Denies history of Chronic sinus problems/congestion, Middle ear problems Hematologic/Lymphatic Patient has history of Anemia - iron Denies history of Hemophilia, Human Immunodeficiency Virus, Lymphedema, Sickle Cell Disease Respiratory Patient has history of Sleep Apnea - CPAP Denies history of Aspiration, Asthma, Chronic Obstructive Pulmonary Disease (COPD), Pneumothorax, Tuberculosis Cardiovascular Patient has history of Hypertension,  Peripheral Venous Disease Denies history of Angina, Arrhythmia, Congestive Heart Failure, Coronary Artery Disease, Deep Vein Thrombosis, Hypotension, Myocardial Infarction, Peripheral Arterial Disease, Phlebitis, Vasculitis Gastrointestinal Denies history of Cirrhosis , Colitis, Crohns, Hepatitis A, Hepatitis B, Hepatitis C Endocrine Denies history of Type I Diabetes, Type II Diabetes Genitourinary Denies history of End Stage Renal Disease Immunological Denies history of Lupus Erythematosus, Raynauds, Scleroderma Integumentary (Skin) Denies history of History of Burn Musculoskeletal Denies history of Gout, Rheumatoid Arthritis, Osteoarthritis, Osteomyelitis Neurologic Denies history of Dementia, Neuropathy, Quadriplegia, Paraplegia, Seizure Disorder Oncologic Denies history of Received Chemotherapy, Received Radiation Psychiatric Denies history of Anorexia/bulimia, Confinement Anxiety Hospitalization/Surgery History - cholecystectomy 1980s. - nephrolithasis 1980s. - plate and rod in right elbow surgery 2010. 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 no acute distress. [name], [name] ([id number]) [id number] Page 10 of  13 Vitals Time Taken: 3:43 AM, Height: 61 in, Weight: 277 lbs, BMI: 52.3, Temperature: 98.0 F, Pulse: 97 bpm, Respiratory Rate: 18 breaths/min, Blood Pressure: 155/66 mmHg. Respiratory Normal work of breathing on room air. General Notes: 01/08/2023: The right calf wound is healed. The wound is smaller and more superficial. Edema control is good. Integumentary (Hair, Skin) Wound #3 status is Open. Original cause of wound was Trauma. The date acquired was: 06/28/2022. The wound has been in treatment 22 weeks. The wound is located on the Left,Posterior Lower Leg. The wound measures 1.2cm length x 0.5cm width x 0.1cm depth; 0.471cm^2 area and 0.047cm^3 volume. There is Fat Layer (Subcutaneous Tissue) exposed. There is no tunneling or undermining noted. There is a small amount of serosanguineous drainage noted. The wound margin is flat and intact. There is medium (34-66%) red granulation within the wound bed. There is a medium (34-66%) amount of necrotic tissue within the wound bed including Adherent Slough. The periwound skin appearance had no abnormalities noted for texture. The periwound skin appearance had no abnormalities noted for moisture. The periwound skin appearance had no abnormalities noted for color. Periwound temperature was noted as No Abnormality. Wound #4 status is Open. Original cause of wound was Gradually Appeared. The date acquired was: 12/07/2022. The wound has been in treatment 3 weeks. The wound is located on the Right,Posterior Lower Leg. The wound measures 0cm length x 0cm width x 0cm depth; 0cm^2 area and 0cm^3 volume. There is no tunneling or undermining noted. There is a none present amount of drainage noted. There is no granulation within the wound bed. There is no necrotic tissue within the wound bed. The periwound skin appearance had no abnormalities noted for texture. The periwound skin appearance had no abnormalities noted for moisture. The periwound skin appearance had no  abnormalities noted for color. Periwound temperature was noted as No Abnormality. Assessment Active Problems ICD-10 Non-pressure chronic ulcer of left calf with fat layer exposed Lymphedema, not elsewhere classified Venous insufficiency (chronic) (peripheral) Morbid (severe) obesity due to excess calories Essential (primary) hypertension Procedures Wound #3 Pre-procedure diagnosis of Wound #3 is a Venous Leg Ulcer located on the Left,Posterior Lower Leg .Severity of Tissue Pre Debridement is: Fat layer exposed. There was a Selective/Open Wound Non-Viable Tissue Debridement with a total area of 0.47 sq cm performed by [name], MD. With the following instrument(s): Curette to remove Non-Viable tissue/material. Material removed includes [facility] after achieving pain control using Lidocaine 4% Topical Solution. No specimens were taken. A time out was conducted at 16:05, prior to the start of  the procedure. A Minimum amount of bleeding was controlled with Pressure. The procedure was tolerated well with a pain level of 0 throughout and a pain level of 0 following the procedure. Post Debridement Measurements: 1.2cm length x 0.5cm width x 0.1cm depth; 0.047cm^3 volume. Character of Wound/Ulcer Post Debridement is improved. Severity of Tissue Post Debridement is: Fat layer exposed. Post procedure Diagnosis Wound #3: Same as Pre-Procedure Pre-procedure diagnosis of Wound #3 is a Venous Leg Ulcer located on the Left,Posterior Lower Leg . There was a Double Layer Compression Therapy Procedure by [name], RN. Post procedure Diagnosis Wound #3: Same as Pre-Procedure Notes: urgo. Plan Follow-up Appointments: Return Appointment in 1 week. - Dr. Lady Gary - room 1 Wed 10/9 @ 3:30 pm Anesthetic: (In clinic) Topical Lidocaine 4% applied to wound bed Bathing/ Shower/ Hygiene: May shower with protection but do not get wound dressing(s) wet. Protect dressing(s) with water repellant cover (for  example, large plastic bag) or a cast cover and may then take shower. Edema Control - Lymphedema / SCD / Other: Lymphedema Pumps. Use Lymphedema pumps on leg(s) 2-3 times a day for 45-60 minutes. If wearing any wraps or hose, do not remove them. Continue exercising as instructed. Elevate legs to the level of the heart or above for 30 minutes daily and/or when sitting for 3-4 times a day throughout the day. Avoid standing for long periods of time. Exercise regularly Moisturize legs daily. Compression stocking or Garment 20-30 mm/Hg pressure to: - right leg daily WOUND #3: - Lower Leg Wound Laterality: Left, Posterior Cleanser: Soap and Water 1 x Per Week/ Discharge Instructions: May shower and wash wound with dial antibacterial soap and water prior to dressing change. Cleanser: Wound Cleanser 1 x Per Week/ Discharge Instructions: Cleanse the wound with wound cleanser prior to applying a clean dressing using gauze sponges, not tissue or cotton balls. Peri-Wound Care: Triamcinolone 15 (g) 1 x Per [name], [name] ([id number]) [id number] Page 11 of 13 Discharge Instructions: Use triamcinolone 15 (g) as directed Peri-Wound Care: Sween Lotion (Moisturizing lotion) 1 x Per Week/ Discharge Instructions: Apply moisturizing lotion as directed Topical: Gentamicin 1 x Per Week/ Discharge Instructions: As directed by physician Topical: Mupirocin Ointment 1 x Per Week/ Discharge Instructions: Apply Mupirocin (Bactroban) as instructed Prim Dressing: Hydrofera Blue Ready Transfer Foam, 2.5x2.5 (in/in) 1 x Per Week/ ary Discharge Instructions: Apply directly to wound bed as directed Secondary Dressing: Woven Gauze Sponges 2x2 in 1 x Per Week/ Discharge Instructions: Apply over primary dressing as directed. Com pression Wrap: Urgo K2, (equivalent to a 4 layer) two layer compression system, regular 1 x Per Week/ Discharge Instructions: Apply Urgo K2 as directed  (alternative to 4 layer compression). 01/08/2023: The right calf wound is healed. The left wound is smaller and more superficial. Edema control is good. I used a curette to debride the slough from the wound surface. We will continue topical gentamicin and mupirocin with Hydrofera Blue and Urgo 4-layer compression equivalent. She will use her lymphedema pumps at least once a day. Follow-up in 1 week. Electronic Signature(s) Signed: 01/08/2023 4:31:18 PM By: [name] MD FACS Entered By: [name] on 01/08/2023 13:31:17 -------------------------------------------------------------------------------- HxROS Details Patient Name: Date of Service: [name], [name] 01/08/2023 3:30 PM Medical Record Number: [id number] Patient Account Number: [id number] Date of Birth/Sex: Treating RN: 24-Jul-1960 (62 y.o. F) Primary Care Provider: Nira Conn Other Clinician: Referring Provider: Treating Provider/Extender: Andrey Campanile in Treatment: 50 Information Obtained From Patient Eyes Medical  History: Negative for: Cataracts; Glaucoma; Optic Neuritis Ear/Nose/Mouth/Throat Medical History: Negative for: Chronic sinus problems/congestion; Middle ear problems Hematologic/Lymphatic Medical History: Positive for: Anemia - iron Negative for: Hemophilia; Human Immunodeficiency Virus; Lymphedema; Sickle Cell Disease Respiratory Medical History: Positive for: Sleep Apnea - CPAP Negative for: Aspiration; Asthma; Chronic Obstructive Pulmonary Disease (COPD); Pneumothorax; Tuberculosis Cardiovascular Medical History: Positive for: Hypertension; Peripheral Venous Disease Negative for: Angina; Arrhythmia; Congestive Heart Failure; Coronary Artery Disease; Deep Vein Thrombosis; Hypotension; Myocardial Infarction; Peripheral Arterial Disease; Phlebitis; Vasculitis Gastrointestinal Medical HistoryIDALI, [name] ([id number]) [phone].pdf  Page 12 of 13 Negative for: Cirrhosis ; Colitis; Crohns; Hepatitis A; Hepatitis B; Hepatitis C Endocrine Medical History: Negative for: Type I Diabetes; Type II Diabetes Genitourinary Medical History: Negative for: End Stage Renal Disease Past Medical History Notes: years ago kidney stones Immunological Medical History: Negative for: Lupus Erythematosus; Raynauds; Scleroderma Integumentary (Skin) Medical History: Negative for: History of Burn Musculoskeletal Medical History: Negative for: Gout; Rheumatoid Arthritis; Osteoarthritis; Osteomyelitis Neurologic Medical History: Negative for: Dementia; Neuropathy; Quadriplegia; Paraplegia; Seizure Disorder Oncologic Medical History: Negative for: Received Chemotherapy; Received Radiation Past Medical History Notes: skin Ca removed from back years ago Psychiatric Medical History: Negative for: Anorexia/bulimia; Confinement Anxiety Immunizations Pneumococcal Vaccine: Received Pneumococcal Vaccination: No Implantable Devices None Hospitalization / Surgery History Type of Hospitalization/Surgery cholecystectomy 1980s nephrolithasis 1980s plate and rod in right elbow surgery 2010 Family and Social History Cancer: Yes - Mother; Diabetes: Yes - Father; Heart Disease: No; Hypertension: Yes - Mother; Kidney Disease: Yes - Mother; Lung Disease: Yes - Father; Seizures: No; Stroke: No; Thyroid Problems: Yes - Paternal Grandparents; Tuberculosis: No; Never smoker; Marital Status - Married; Alcohol Use: Never; Drug Use: No History; Caffeine Use: Daily - coffee; Financial Concerns: No; Food, Clothing or Shelter Needs: No; Support System Lacking: No; Transportation Concerns: No Electronic Signature(s) Signed: 01/08/2023 4:32:07 PM By: [name] MD FACS Entered By: [name] on 01/08/2023 13:25:00 [name] ([id number]) [id number] Page 13 of  13 -------------------------------------------------------------------------------- SuperBill Details Patient Name: Date of Service: [name] 01/08/2023 Medical Record Number: [id number] Patient Account Number: [id number] Date of Birth/Sex: Treating RN: November 12, 1960 (62 y.o. F) Primary Care Provider: Nira Conn Other Clinician: Referring Provider: Treating Provider/Extender: Andrey Campanile in Treatment: 50 Diagnosis Coding ICD-10 Codes Code Description [phone] Non-pressure chronic ulcer of left calf with fat layer exposed I89.0 Lymphedema, not elsewhere classified I87.2 Venous insufficiency (chronic) (peripheral) E66.01 Morbid (severe) obesity due to excess calories I10 Essential (primary) hypertension Facility Procedures : CPT4 Code: [id number] Description: 97597 - DEBRIDE WOUND 1ST 20 SQ CM OR < ICD-10 Diagnosis Description L97.222 Non-pressure chronic ulcer of left calf with fat layer exposed Modifier: Quantity: 1 Physician Procedures : CPT4 Code Description Modifier [id number] 99214 - WC PHYS LEVEL 4 - EST PT 25 ICD-10 Diagnosis Description L97.222 Non-pressure chronic ulcer of left calf with fat layer exposed I89.0 Lymphedema, not elsewhere classified I87.2 Venous insufficiency  (chronic) (peripheral) E66.01 Morbid (severe) obesity due to excess calories Quantity: 1 : [id number] 97597 - WC PHYS DEBR WO ANESTH 20 SQ CM ICD-10 Diagnosis Description L97.222 Non-pressure chronic ulcer of left calf with fat layer exposed Quantity: 1 Electronic Signature(s) Signed: 01/08/2023 4:31:37 PM By: [name] MD FACS Entered By: [name] on 01/08/2023 13:31:37

## 2023-01-15 ENCOUNTER — Encounter (HOSPITAL_BASED_OUTPATIENT_CLINIC_OR_DEPARTMENT_OTHER): Payer: BC Managed Care – PPO | Admitting: General Surgery

## 2023-01-15 DIAGNOSIS — L97222 Non-pressure chronic ulcer of left calf with fat layer exposed: Secondary | ICD-10-CM | POA: Diagnosis not present

## 2023-01-15 NOTE — Progress Notes (Signed)
JAMERICA, SNAVELY ([id number]) [id number] Page 1 of 8 Visit Report for 01/15/2023 Arrival Information Details Patient Name: Date of Service: [place], [name] 01/15/2023 3:30 PM Medical Record Number: [id number] Patient Account Number: [id number] Date of Birth/Sex: Treating RN: [birth year] (62 y.o. [name], [name] Primary Care Murrell Dome: Nira Conn Other Clinician: Referring Orlean Holtrop: Treating Jarome Trull/Extender: Andrey Campanile in Treatment: 51 Visit Information History Since Last Visit Added or deleted any medications: No Patient Arrived: Ambulatory Any new allergies or adverse reactions: No Arrival Time: 15:32 Had a fall or experienced change in No Accompanied By: self activities of daily living that may affect Transfer Assistance: None risk of falls: Patient Identification Verified: Yes Signs or symptoms of abuse/neglect since last visito No Secondary Verification Process Completed: Yes Hospitalized since last visit: No Patient Requires Transmission-Based Precautions: No Implantable device outside of the clinic excluding No Patient Has Alerts: No cellular tissue based products placed in the center since last visit: Has Dressing in Place as Prescribed: Yes Has Compression in Place as Prescribed: Yes Pain Present Now: No Electronic Signature(s) Signed: 01/15/2023 4:30:25 PM By: [name] RN, BSN Entered By: [name] on 01/15/2023 12:33:00 -------------------------------------------------------------------------------- Compression Therapy Details Patient Name: Date of Service: [name], [name] 01/15/2023 3:30 PM Medical Record Number: [id number] Patient Account Number: [id number] Date of Birth/Sex: Treating RN: April 14, 1960 (62 y.o. [name] Primary Care Shirley Decamp: Nira Conn Other Clinician: Referring Dreonna Hussein: Treating Asier Desroches/Extender: Andrey Campanile in  Treatment: 51 Compression Therapy Performed for Wound Assessment: Wound #3 Left,Posterior Lower Leg Performed By: Clinician [name], RN Compression Type: Double Layer Post Procedure Diagnosis Same as Pre-procedure Notes URGO Electronic Signature(s) Signed: 01/15/2023 4:30:25 PM By: [name] RN, BSN Entered By: [name] on 01/15/2023 12:41:10 [name] ([id number]) [id number] Page 2 of 8 -------------------------------------------------------------------------------- Encounter Discharge Information Details Patient Name: Date of Service: [name] 01/15/2023 3:30 PM Medical Record Number: [id number] Patient Account Number: [id number] Date of Birth/Sex: Treating RN: Jun 25, 1960 (62 y.o. [name] Primary Care Stevon Gough: Nira Conn Other Clinician: Referring Mickelle Goupil: Treating Imad Shostak/Extender: Andrey Campanile in Treatment: 29 Encounter Discharge Information Items Discharge Condition: Stable Ambulatory Status: Ambulatory Discharge Destination: Home Transportation: Private Auto Accompanied By: self Schedule Follow-up Appointment: Yes Clinical Summary of Care: Patient Declined Electronic Signature(s) Signed: 01/15/2023 4:30:25 PM By: [name] RN, BSN Entered By: [name] on 01/15/2023 13:04:35 -------------------------------------------------------------------------------- Lower Extremity Assessment Details Patient Name: Date of Service: [place], [name] 01/15/2023 3:30 PM Medical Record Number: [id number] Patient Account Number: [id number] Date of Birth/Sex: Treating RN: 1961-02-01 (62 y.o. [name] Primary Care Shahla Betsill: Nira Conn Other Clinician: Referring Kahmari Herard: Treating Khiyan Crace/Extender: Andrey Campanile in Treatment: 51 Edema Assessment Assessed: [Left: No] [Right: No] Edema: [Left: Ye] [Right: s] Calf Left:  Right: Point of Measurement: From Medial Instep 37.5 cm Ankle Left: Right: Point of Measurement: From Medial Instep 22.5 cm Vascular Assessment Pulses: Dorsalis Pedis Palpable: [Left:Yes] Extremity colors, hair growth, and conditions: Extremity Color: [Left:Hyperpigmented] Hair Growth on Extremity: [Left:No] Temperature of Extremity: [Left:Warm < 3 seconds] Electronic Signature(s) Signed: 01/15/2023 4:30:25 PM By: [name] RN, BSN Entered By: [name] on 01/15/2023 12:37:36 [name] ([id number]) [id number] Page 3 of 8 -------------------------------------------------------------------------------- Multi Wound Chart Details Patient Name: Date of Service: [place] 01/15/2023 3:30 PM Medical Record Number: [id number] Patient Account Number: [id number] Date of Birth/Sex: Treating RN: 12/01/1960 (62 y.o. F) Primary  Care Danyael Alipio: Nira Conn Other Clinician: Referring Leibish Mcgregor: Treating Marijo Quizon/Extender: Andrey Campanile in Treatment: 51 Vital Signs Height(in): 61 Pulse(bpm): 94 Weight(lbs): 277 Blood Pressure(mmHg): 128/65 Body Mass Index(BMI): 52.3 Temperature(F): 97.9 Respiratory Rate(breaths/min): 20 [3:Photos:] [N/A:N/A] Left, Posterior Lower Leg N/A N/A Wound Location: Trauma N/A N/A Wounding Event: Venous Leg Ulcer N/A N/A Primary Etiology: Anemia, Sleep Apnea, Hypertension, N/A N/A Comorbid History: Peripheral Venous Disease 06/28/2022 N/A N/A Date Acquired: 104 N/A N/A Weeks of Treatment: Open N/A N/A Wound Status: No N/A N/A Wound Recurrence: 0.4x0.1x0.1 N/A N/A Measurements L x W x D (cm) 0.031 N/A N/A A (cm) : rea 0.003 N/A N/A Volume (cm) : 97.70% N/A N/A % Reduction in Area: 97.70% N/A N/A % Reduction in Volume: Full Thickness Without Exposed N/A N/A Classification: Support Structures Small N/A N/A Exudate Amount: Serosanguineous N/A N/A Exudate  Type: red, brown N/A N/A Exudate Color: Flat and Intact N/A N/A Wound Margin: Large (67-100%) N/A N/A Granulation Amount: Red N/A N/A Granulation Quality: None Present (0%) N/A N/A Necrotic Amount: Fat Layer (Subcutaneous Tissue): Yes N/A N/A Exposed Structures: Fascia: No Tendon: No Muscle: No Joint: No Bone: No Large (67-100%) N/A N/A Epithelialization: Rash: Yes N/A N/A Periwound Skin Texture: No Abnormalities Noted N/A N/A Periwound Skin Moisture: Erythema: No N/A N/A Periwound Skin Color: No Abnormality N/A N/A Temperature: Compression Therapy N/A N/A Procedures Performed: Treatment Notes Wound #3 (Lower Leg) Wound Laterality: Left, Posterior Cleanser Soap and Water Discharge Instruction: May shower and wash wound with dial antibacterial soap and water prior to dressing change. EYMI, LIPUMA ([id number]) [id number] Page 4 of 8 Wound Cleanser Discharge Instruction: Cleanse the wound with wound cleanser prior to applying a clean dressing using gauze sponges, not tissue or cotton balls. Peri-Wound Care Triamcinolone 15 (g) Discharge Instruction: Use triamcinolone 15 (g) as directed Sween Lotion (Moisturizing lotion) Discharge Instruction: Apply moisturizing lotion as directed Topical Gentamicin Discharge Instruction: As directed by physician Mupirocin Ointment Discharge Instruction: Apply Mupirocin (Bactroban) as instructed Primary Dressing Maxorb Extra Ag+ Alginate Dressing, 2x2 (in/in) Discharge Instruction: Apply to wound bed as instructed Secondary Dressing Woven Gauze Sponges 2x2 in Discharge Instruction: Apply over primary dressing as directed. Secured With Compression Wrap Urgo K2, (equivalent to a 4 layer) two layer compression system, regular Discharge Instruction: Apply Urgo K2 as directed (alternative to 4 layer compression). Compression Stockings Add-Ons Electronic Signature(s) Signed: 01/15/2023 4:24:47 PM By:  [name] MD FACS Entered By: [name] on 01/15/2023 13:24:47 -------------------------------------------------------------------------------- Multi-Disciplinary Care Plan Details Patient Name: Date of Service: [place], [name] 01/15/2023 3:30 PM Medical Record Number: [id number] Patient Account Number: [id number] Date of Birth/Sex: Treating RN: 03-16-1961 (62 y.o. [name] Primary Care Letisia Schwalb: Nira Conn Other Clinician: Referring Adonai Helzer: Treating Jayvin Hurrell/Extender: Andrey Campanile in Treatment: 66 Multidisciplinary Care Plan reviewed with physician Active Inactive Venous Leg Ulcer Nursing Diagnoses: Knowledge deficit related to disease process and management Potential for venous Insuffiency (use before diagnosis confirmed) Goals: Patient will maintain optimal edema control Date Initiated: 07/05/2022 Target Resolution Date: 01/29/2023 Goal Status: Active Interventions: Assess peripheral edema status every visit. CHARLSIE, FLEEGER ([id number]) [id number] Page 5 of 8 Compression as ordered Provide education on venous insufficiency Treatment Activities: Therapeutic compression applied : 07/05/2022 Notes: Electronic Signature(s) Signed: 01/15/2023 4:30:25 PM By: [name] RN, BSN Entered By: [name] on 01/15/2023 11:41:08 -------------------------------------------------------------------------------- Pain Assessment Details Patient Name: Date of Service: [name], [name] 01/15/2023 3:30 PM Medical Record Number: [id number] Patient Account Number:  [id number] Date of Birth/Sex: Treating RN: [birth year] (62 y.o. [name] Primary Care Benito Lemmerman: Nira Conn Other Clinician: Referring Yandriel Boening: Treating Schylar Allard/Extender: Andrey Campanile in Treatment: 69 Active Problems Location of Pain Severity and Description of Pain Patient Has Paino  No Site Locations Rate the pain. Current Pain Level: 0 Pain Management and Medication Current Pain Management: Electronic Signature(s) Signed: 01/15/2023 4:30:25 PM By: [name] RN, BSN Entered By: [name] on 01/15/2023 12:33:54 -------------------------------------------------------------------------------- Patient/Caregiver Education Details Patient Name: Date of Service: [name], Vivi Ferns 10/9/2024andnbsp3:30 PM Medical Record Number: [id number] Patient Account Number: [id number] Date of Birth/Gender: Treating RN: 1960-07-06 (62 y.o. [name] Primary Care Physician: Nira Conn Other Clinician: Referring Physician: Treating Physician/Extender: Heinz Knuckles [place], Romie Minus ([id number]) [id number] Page 6 of 8 Weeks in Treatment: 32 Education Assessment Education Provided To: Patient Education Topics Provided Venous: Methods: Explain/Verbal Responses: Reinforcements needed, State content correctly Wound/Skin Impairment: Methods: Explain/Verbal Responses: Reinforcements needed, State content correctly Electronic Signature(s) Signed: 01/15/2023 4:30:25 PM By: [name] RN, BSN Entered By: [name] on 01/15/2023 11:41:31 -------------------------------------------------------------------------------- Wound Assessment Details Patient Name: Date of Service: [name], [name] 01/15/2023 3:30 PM Medical Record Number: [id number] Patient Account Number: [id number] Date of Birth/Sex: Treating RN: [birth year] (62 y.o. [name] Primary Care Sammy Douthitt: Nira Conn Other Clinician: Referring Domique Reardon: Treating Zayvian Mcmurtry/Extender: Andrey Campanile in Treatment: 51 Wound Status Wound Number: 3 Primary Venous Leg Ulcer Etiology: Wound Location: Left, Posterior Lower Leg Wound Status: Open Wounding Event: Trauma Comorbid Anemia, Sleep Apnea, Hypertension,  Peripheral Venous Date Acquired: 06/28/2022 History: Disease Weeks Of Treatment: 23 Clustered Wound: No Photos Wound Measurements Length: (cm) 0.4 Width: (cm) 0.1 Depth: (cm) 0.1 Area: (cm) 0.031 Volume: (cm) 0.003 % Reduction in Area: 97.7% % Reduction in Volume: 97.7% Epithelialization: Large (67-100%) Tunneling: No Undermining: No Wound Description Classification: Full Thickness Without Exposed Support Structures Wound Margin: Flat and Intact Exudate Amount: Small Exudate Type: Serosanguineous Andrades, Daniya H ([id number]) Exudate Color: red, brown Foul Odor After Cleansing: No Slough/Fibrino Yes [id number] Page 7 of 8 Wound Bed Granulation Amount: Large (67-100%) Exposed Structure Granulation Quality: Red Fascia Exposed: No Necrotic Amount: None Present (0%) Fat Layer (Subcutaneous Tissue) Exposed: Yes Tendon Exposed: No Muscle Exposed: No Joint Exposed: No Bone Exposed: No Periwound Skin Texture Texture Color No Abnormalities Noted: Yes No Abnormalities Noted: Yes Moisture Temperature / Pain No Abnormalities Noted: Yes Temperature: No Abnormality Treatment Notes Wound #3 (Lower Leg) Wound Laterality: Left, Posterior Cleanser Soap and Water Discharge Instruction: May shower and wash wound with dial antibacterial soap and water prior to dressing change. Wound Cleanser Discharge Instruction: Cleanse the wound with wound cleanser prior to applying a clean dressing using gauze sponges, not tissue or cotton balls. Peri-Wound Care Triamcinolone 15 (g) Discharge Instruction: Use triamcinolone 15 (g) as directed Sween Lotion (Moisturizing lotion) Discharge Instruction: Apply moisturizing lotion as directed Topical Gentamicin Discharge Instruction: As directed by physician Mupirocin Ointment Discharge Instruction: Apply Mupirocin (Bactroban) as instructed Primary Dressing Maxorb Extra Ag+ Alginate Dressing, 2x2 (in/in) Discharge  Instruction: Apply to wound bed as instructed Secondary Dressing Woven Gauze Sponges 2x2 in Discharge Instruction: Apply over primary dressing as directed. Secured With Compression Wrap Urgo K2, (equivalent to a 4 layer) two layer compression system, regular Discharge Instruction: Apply Urgo K2 as directed (alternative to 4 layer compression). Compression Stockings Add-Ons Electronic Signature(s) Signed: 01/15/2023 4:30:25 PM By: [name] RN, BSN Entered By: [name] on 01/15/2023 12:39:26 --------------------------------------------------------------------------------  Vitals Details Patient Name: Date of Service: [name] 01/15/2023 3:30 PM Medical Record Number: [id number] Patient Account Number: [id number] DEALIE, KOELZER ([id number]) [id number] Page 8 of 8 Date of Birth/Sex: Treating RN: May 13, 1960 (62 y.o. [name] Primary Care Benjerman Molinelli: Other Clinician: Nira Conn Referring Nesa Distel: Treating Brean Carberry/Extender: Andrey Campanile in Treatment: 51 Vital Signs Time Taken: 15:33 Temperature (F): 97.9 Height (in): 61 Pulse (bpm): 94 Weight (lbs): 277 Respiratory Rate (breaths/min): 20 Body Mass Index (BMI): 52.3 Blood Pressure (mmHg): 128/65 Reference Range: 80 - 120 mg / dl Electronic Signature(s) Signed: 01/15/2023 4:30:25 PM By: [name] RN, BSN Entered By: [name] on 01/15/2023 12:33:30

## 2023-01-16 NOTE — Progress Notes (Signed)
[name], [name] ([id number]) [id number] Page 1 of 12 Visit Report for 01/15/2023 Chief Complaint Document Details Patient Name: Date of Service: [name] 01/15/2023 3:30 PM Medical Record Number: [id number] Patient Account Number: [id number] Date of Birth/Sex: Treating RN: [birth year] (62 y.o. F) Primary Care Provider: Nira [name] Other Clinician: Referring Provider: Treating Provider/Extender: [name] in Treatment: 52 Information Obtained from: Patient Chief Complaint RLE ulcer Electronic Signature(s) Signed: 01/15/2023 4:26:56 PM By: [name] Guess MD FACS Entered By: [name] on 01/15/2023 13:26:56 -------------------------------------------------------------------------------- HPI Details Patient Name: Date of Service: [name], [name] 01/15/2023 3:30 PM Medical Record Number: [id number] Patient Account Number: [id number] Date of Birth/Sex: Treating RN: 08-Nov-1960 (62 y.o. F) Primary Care Provider: Nira [name] Other Clinician: Referring Provider: Treating Provider/Extender: [name] in Treatment: 19 History of Present Illness HPI Description: 02/16/2020 upon evaluation today patient actually appears to be doing poorly in regard to her left medial lower extremity ulcer. This is actually an area that she tells me she has had intermittent issues with over the years although has been closed for some time she typically uses compression right now she has juxta lite compression wraps. With that being said she tells me that this nonetheless open several weeks/months ago and has been given her trouble since. She does have a history of chronic venous insufficiency she is seeing specialist for this in the past she has had an ablation as well as sclerotherapy. With that being said she also has hypertension chronically which is managed by her primary care provider. In  general she seems to be worsening overall with regard to the wound and states that she finally realized that she needed to come in and have somebody look at this and not continue to try to manage this on her own. No fevers, chills, nausea, vomiting, or diarrhea. 02/23/2020 on evaluation today patient appears to be doing well with regard to her wound. This is showing some signs of improvement which is great news still were not quite at the point where I would like to be as far as the overall appearance of the wound is concerned but I do believe this is better than last week. I do believe the Iodoflex is helping as well. 03/08/2020 upon evaluation today patient appears to be doing well with regard to her wound. She has been tolerating the dressing changes without complication. Fortunately I feel like she has made great progress with the Iodoflex but I feel like it may be the point rest to switch to something else possibly a collagen- based dressing at this time. 03/15/2020 upon evaluation today patient appears to be doing excellent in regard to her leg ulcer. She has been tolerating the dressing changes without complication. Fortunately there is no signs of active infection. Overall she is measuring a little bit smaller today which is great news. 03/22/2020 upon evaluation today patient appears to be doing well with regard to her wound. She has been tolerating the dressing changes without complication. Fortunately there is no signs of active infection at this time. 03/28/2020; patient I do not usually see however she has a wound on the left anterior lower leg secondary to chronic venous insufficiency we have been using silver collagen under compression. She arrives in clinic with a nonviable surface requiring debridement 04/12/2020 upon evaluation today patient appears to be doing well all things considered with regard to her leg ulcer. She is tolerating the dressing changes without complication there  is  minimal dry skin around the edges of the wound that may be trapping and stopping some of the events of the new skin I am can work on that today. Otherwise the surface of the wound appears to be doing excellent. 04/19/2020 upon evaluation today patient appears to be doing well with regard to her leg ulcer. She has been tolerating dressing changes without complication. Fortunately there is no signs of active infection at this time. No fever chills noted overall very pleased with how things seem to be progressing. [name] ([id number]) [id number] Page 2 of 12 04/26/2020 on evaluation today patient appears to be doing well with regard to her wound currently. Is showing signs of excellent improvement overall is filling in nicely and there does not appear to be any signs of infection. No fevers, chills, nausea, vomiting, or diarrhea. 05/03/2020 upon evaluation today patient appears to be doing well with regard to her wound on the leg. This overall showing signs of good improvement which is great she has some good epithelial growth and overall I think that things are moving in the correct direction. We likewise going to continue with the wound care measures as before since she seems to making such good improvement. 2/3; venous wound on the left medial leg. This is contracting. We are using Prisma and 3 layer compression. She has a stocking and waiting in the eventuality this heals. She is already using it on the right 05/17/2020 upon evaluation today patient appears to be doing well with regard to her leg ulcer. She has been tolerating the dressing changes without complication. Fortunately there is no signs of active infection which is great news and overall very pleased with where things stand today. No fevers, chills, nausea, vomiting, or diarrhea. 05/24/2020 upon evaluation today patient appears to be doing well with regard to her wound. Overall I feel like she is making  excellent progress. There does not appear to be any signs of active infection which is great news. 05/31/2020 upon evaluation today patient appears to be doing well with regard to her wound. There does not appear to be any signs of active infection which is great news overall I am extremely pleased with where things stand today. READMISSION 01/23/2022 She returns to clinic today with a new wound on her right posterior calf. She says that she was cleaning out an old shed near the middle of August this year and then noticed what seemed to be a bug bite on her right posterior calf. It was itchy, red, and raised. By the end of September, an ulcer had developed. She has been applying various topical creams such as hydrocortisone and others to the site. When it was not improving, she made an appointment in the wound care center. ABI in clinic today was 0.97. On her right posterior calf, there is a circular wound with necrotic fat and black eschar present. There is no purulent drainage or malodor. The periwound skin is in good condition with just a little induration that appears to be secondary to inflammation. 01/31/2022: The wound measures a little bit larger today, but overall is quite a bit cleaner. There is some undermining from 9:00 to 3:00. She is having some periwound itching and says that her wrap slid. 02/08/2022: Despite using an Unna boot first layer at the top of her wrap, it slid again and it looks like there is been some bruising at the wound site. The wound is about the same size in terms of  dimensions. There is a fair amount of slough and nonviable tissue still present. Edema control is better than last week. 02/15/2022: The wound dimensions are about the same. There is less nonviable tissue present. The periwound erythema has improved. 02/22/2022: The wound has deteriorated over the past week. It is larger and the periwound is more edematous, erythematous, and indurated. It looks as  though there has been more tissue breakdown with undermining present. She is having more pain. There is a foul odor coming from the wound. 02/26/2022: The wound looks quite a bit better today and the odor is gone. I still do not have her culture data back, but she seems to be responding well to the Augmentin. 03/06/2022: Her wound continues to improve. There is still some slough accumulation, but the periwound skin is less inflamed. Her culture returned with a polymicrobial population including Pseudomonas and so levofloxacin was also prescribed. She did not understand why a second antibiotic was being added so she has not yet initiated this. 03/14/2022: The wound is about the same, to perhaps a little bit larger. There is a fair amount of slough accumulation on the surface, as well as some hypertrophic granulation tissue. She is still taking levofloxacin, but has completed taking Augmentin. She has her Jodie Echevaria compound with her today. 12/14; the patient has a significant circular wound on the posterior right calf. She is using Keystone and silver alginate under 3 layer compression. Her ABIs were within normal limits at 0.97. This may have been traumatic or an insect bite at the start I reviewed these records. 04/04/2022: Since I last saw the wound, it has contracted considerably. There is a fairly thick layer of slough on the surface, as it has not been debrided since the last time I did it. Edema control is excellent and the periwound skin is in much better condition. 04/12/2022: No significant change in the wound dimensions. It is filling with granulation tissue. Still with slough accumulation on the surface. 04/19/2022: The wound is smaller this week. There is some slough accumulation on the surface. 04/26/2022: The wound is smaller again this week and significantly cleaner. The wound surface is a little bit drier than ideal. 05/03/2022: The wound measurements were about the same, but visually it  appears smaller. There is still some undermining at the top of the wound. Moisture balance is better this week. 05/10/2022: The wound measured smaller today. There is still a fair amount of undermining present. Slough has built up on the surface. We are still awaiting snap VAC approval. 05/17/2022: The wound is a little bit smaller today. There is less slough on the surface, but the granulation tissue still is not very robust. She has been approved for snap VAC but will have a 20% coinsurance and it is not clear what that amount would end up being for her. 05/27/2022: The surface of the wound has deteriorated and it is gray and fibrotic. No significant odor, but the drainage on her dressing was a little bit purulent. 05/31/2022: The wound looks quite a bit better today. It is still a little fibrotic but no longer has purulent-looking drainage. The color is better. She spoke with her insurance company and is interested in trying the snap VAC, now that she is aware of the cost to her. 06/07/2022: After 1 week in the snap VAC, there has been substantial improvement to her wound. The undermined portion is closing in. The surface has a healthier color and appearance. There is very minimal slough accumulation.  06/14/2022: The more shallow part of the undermined portion of her wound has closed. There is still some undermining from about 1-2 o'clock. The surface continues to improve. Minimal slough accumulation. 06/28/2022: The wound is shallower this week and the undermining has essentially closed and completely. The surface tissue is improving. 07/05/2022: The depth is almost immeasurable and the surface is nearly flush with the surrounding skin. The undermining has been eliminated. There is light slough on the wound surface. 07/12/2022: There is a little bit of slough on the wound surface. The depth is about the same as last week. 07/19/2022: The wound is essentially flush with the surrounding skin surface. There is  slight slough present. No real change in the AP or transverse dimensions. 07/26/2022: The wound measured a little bit smaller today. It is flush with the surrounding skin surface and there is good granulation tissue present. Minimal slough and biofilm accumulation. [name], [name] ([id number]) [id number] Page 3 of 12 08/02/2022: The right posterior leg wound is a little bit smaller. There is a little slough accumulation. She reported a new wound to [place] today. It is on her left posterior calf. It has been present for about 6 weeks. She is not sure of how it occurred. She has been trying to manage it at home with topical Neosporin and Band-Aids. The fat layer is exposed. The surface is fibrotic and covered with slough. 08/09/2022: The right posterior leg wound is smaller again this week. There is good granulation tissue on the surface with minimal slough accumulation. The left posterior calf wound has built up a thick layer of slough. It is still quite fibrotic underneath the slough, but there is a little bit of a pink color beginning to emerge. Edema control is good. 08/16/2022: Both wounds are smaller this week. There is minimal slough on the right posterior leg wound. There is slough that has reaccumulated on the left posterior calf wound but there is a little bit more granulation tissue emerging. 08/23/2022: Both wounds are about the same size this week, but the quality of the tissue and amount of granulation tissue on the left are both better. 08/30/2022: Both wounds measure smaller today, but there has been some tissue breakdown adjacent to the left leg wound. She says it has been itchy. There is a little bit of slough on the surface of both wounds. 09/06/2022: The right wound measures smaller today. There is minimal slough on the wound surface. The left wound is about the same size. The periwound tissue looks better this week. There is still a layer of fibrinous slough on  the left wound. Edema control is good bilaterally. 6/7 the patient has wounds on bilateral calfs. We are using silver alginate on the right Iodoflex on the left under Urgo lite compression. The wounds are measuring smaller. 09/20/2022: The right wound is flush with the surrounding skin. There is good granulation tissue with some biofilm and thin eschar present. On the left, there is some slough accumulation and the patient says it has been a little itchy this week. 09/27/2022: No significant change to the right wound. The left wound measures a little smaller, but still has some depth to it with slough accumulation. 10/04/2022: Nice improvement in both wounds. They are smaller and more superficial. The left wound has better granulation tissue and no longer has the hard fibrous surface. 10/11/2022: Both wounds continue to contract. There is minimal slough and eschar present. Granulation tissue continues to fill in on the left. Edema control  is good. 10/21/2022: The right leg wound is about half the size as it was last week. There is a little perimeter eschar and some light slough on the surface. The left leg wound is about the same size, but shallower. There is still slough accumulation but the buds of granulation tissue are getting larger. Edema control remains excellent. 10/30/2022: The right leg wound is healed. The left leg wound is smaller with better granulation tissue and only a little bit of slough present. Edema control is excellent. 11/07/2022: The left leg wound continues to contract and the surface continues to improve. Minimal slough with good granulation tissue filling in. 11/13/2022: Most of the left leg wound has epithelialized. There is a small open area remaining that is about a millimeter in each dimension. There is slough accumulation present. 11/20/2022: There is a little bit of slough on the wound surface. It is about the same size as last week. Edema control is good. 11/27/2022: A tiny  opening remains with slough on the surface. Edema control remains excellent. 12/05/2022: A small opening remains underneath some eschar and slough. 12/12/2022: No difference in the wound on her left leg. Unfortunately, over the weekend, her right leg wound reopened. It is a little over a centimeter in diameter and very superficial with some slough on the surface. 12/18/2022: Both wounds are larger today. There is increased slough on both surfaces, as well. Although edema control seems adequate, she is on her feet quite a bit more due to returning to school and being on her feet much of the day. 12/25/2022: The left calf wound is stable, but the right calf wound is considerably smaller. Both have a little bit of slough present. Edema control is adequate. 01/01/2023: Both wounds are smaller today. The right calf wound is nearly closed under some eschar. Edema control is adequate. 01/08/2023: The right calf wound is healed. The wound is smaller and more superficial. Edema control is good. She did receive the second sleeve so now she has bilateral lymphedema pumps and she has been using them. 01/15/2023: The left calf wound is down to just a couple of millimeters and is very superficial. Edema control is excellent. Electronic Signature(s) Signed: 01/15/2023 4:27:26 PM By: [name] Guess MD FACS Entered By: [name] on 01/15/2023 13:27:25 -------------------------------------------------------------------------------- Physical Exam Details Patient Name: Date of Service: [name], [name] 01/15/2023 3:30 PM Medical Record Number: [id number] Patient Account Number: [id number] Date of Birth/Sex: Treating RN: 06/29/1960 (62 y.o. F) Primary Care Provider: Nira [name] Other Clinician: Referring Provider: Treating Provider/Extender: [name] in Treatment: [address] Ashley H ([id number]) [id number] Page 4 of 12 Constitutional . . . .  no acute distress. Respiratory Normal work of breathing on room air. Notes 01/15/2023: The left calf wound is down to just a couple of millimeters and is very superficial. Edema control is excellent. Electronic Signature(s) Signed: 01/15/2023 4:27:57 PM By: [name] Guess MD FACS Entered By: [name] on 01/15/2023 13:27:56 -------------------------------------------------------------------------------- Physician Orders Details Patient Name: Date of Service: [name], [name] 01/15/2023 3:30 PM Medical Record Number: [id number] Patient Account Number: [id number] Date of Birth/Sex: Treating RN: 16-Dec-1960 (62 y.o. [name] Primary Care Provider: Nira [name] Other Clinician: Referring Provider: Treating Provider/Extender: [name] in Treatment: 23 The following information was scribed by: [name] The information was scribed for: [name] Verbal / Phone Orders: No Diagnosis Coding ICD-10 Coding Code Description [phone] Non-pressure chronic ulcer of left calf with  fat layer exposed I89.0 Lymphedema, not elsewhere classified I87.2 Venous insufficiency (chronic) (peripheral) E66.01 Morbid (severe) obesity due to excess calories I10 Essential (primary) hypertension Follow-up Appointments ppointment in 1 week. - Dr. Lady Gary - room 1 Return A Wed 10/16 @ 2:45 pm Anesthetic (In clinic) Topical Lidocaine 4% applied to wound bed Bathing/ Shower/ Hygiene May shower with protection but do not get wound dressing(s) wet. Protect dressing(s) with water repellant cover (for example, large plastic bag) or a cast cover and may then take shower. Edema Control - Lymphedema / SCD / Other Lymphedema Pumps. Use Lymphedema pumps on leg(s) 2-3 times a day for 45-60 minutes. If wearing any wraps or hose, do not remove them. Continue exercising as instructed. Elevate legs to the level of the heart or above for 30 minutes daily and/or  when sitting for 3-4 times a day throughout the day. Avoid standing for long periods of time. Exercise regularly Moisturize legs daily. Compression stocking or Garment 20-30 mm/Hg pressure to: - right leg daily Wound Treatment Wound #3 - Lower Leg Wound Laterality: Left, Posterior Cleanser: Soap and Water 1 x Per Week Discharge Instructions: May shower and wash wound with dial antibacterial soap and water prior to dressing change. Cleanser: Wound Cleanser 1 x Per Week Discharge Instructions: Cleanse the wound with wound cleanser prior to applying a clean dressing using gauze sponges, not tissue or cotton balls. Peri-Wound Care: Triamcinolone 15 (g) 1 x Per Week Discharge Instructions: Use triamcinolone 15 (g) as directed MIKIAH, DURALL ([id number]) [phone].pdf Page 5 of 12 Peri-Wound Care: Sween Lotion (Moisturizing lotion) 1 x Per Week Discharge Instructions: Apply moisturizing lotion as directed Topical: Gentamicin 1 x Per Week Discharge Instructions: As directed by physician Topical: Mupirocin Ointment 1 x Per Week Discharge Instructions: Apply Mupirocin (Bactroban) as instructed Prim Dressing: Maxorb Extra Ag+ Alginate Dressing, 2x2 (in/in) 1 x Per Week ary Discharge Instructions: Apply to wound bed as instructed Secondary Dressing: Woven Gauze Sponges 2x2 in 1 x Per Week Discharge Instructions: Apply over primary dressing as directed. Compression Wrap: Urgo K2, (equivalent to a 4 layer) two layer compression system, regular 1 x Per Week Discharge Instructions: Apply Urgo K2 as directed (alternative to 4 layer compression). Electronic Signature(s) Signed: 01/16/2023 7:55:21 AM By: [name] Guess MD FACS Entered By: [name] on 01/15/2023 13:28:50 -------------------------------------------------------------------------------- Problem List Details Patient Name: Date of Service: [name], [name] 01/15/2023 3:30 PM Medical Record Number:  [id number] Patient Account Number: [id number] Date of Birth/Sex: Treating RN: 03/15/1961 (62 y.o. [name] Primary Care Provider: Nira [name] Other Clinician: Referring Provider: Treating Provider/Extender: [name] in Treatment: 89 Active Problems ICD-10 Encounter Code Description Active Date MDM Diagnosis L97.222 Non-pressure chronic ulcer of left calf with fat layer exposed 08/02/2022 No Yes I89.0 Lymphedema, not elsewhere classified 01/23/2022 No Yes I87.2 Venous insufficiency (chronic) (peripheral) 01/23/2022 No Yes E66.01 Morbid (severe) obesity due to excess calories 01/23/2022 No Yes I10 Essential (primary) hypertension 01/23/2022 No Yes Inactive Problems ICD-10 Code Description Active Date Inactive Date L97.212 Non-pressure chronic ulcer of right calf with fat layer exposed 12/12/2022 01/23/2022 [name] ([id number]) [phone].pdf Page 6 of 12 Resolved Problems Electronic Signature(s) Signed: 01/15/2023 4:24:41 PM By: [name] Guess MD FACS Entered By: [name] on 01/15/2023 13:24:40 -------------------------------------------------------------------------------- Progress Note Details Patient Name: Date of Service: [name], [name] 01/15/2023 3:30 PM Medical Record Number: [id number] Patient Account Number: [id number] Date of Birth/Sex: Treating RN: July 14, 1960 (62 y.o. F) Primary Care Provider: Casimiro Needle,  Britta Mccreedy Other Clinician: Referring Provider: Treating Provider/Extender: [name] in Treatment: 51 Subjective Chief Complaint Information obtained from Patient RLE ulcer History of Present Illness (HPI) 02/16/2020 upon evaluation today patient actually appears to be doing poorly in regard to her left medial lower extremity ulcer. This is actually an area that she tells me she has had intermittent issues with over the years although has been closed  for some time she typically uses compression right now she has juxta lite compression wraps. With that being said she tells me that this nonetheless open several weeks/months ago and has been given her trouble since. She does have a history of chronic venous insufficiency she is seeing specialist for this in the past she has had an ablation as well as sclerotherapy. With that being said she also has hypertension chronically which is managed by her primary care provider. In general she seems to be worsening overall with regard to the wound and states that she finally realized that she needed to come in and have somebody look at this and not continue to try to manage this on her own. No fevers, chills, nausea, vomiting, or diarrhea. 02/23/2020 on evaluation today patient appears to be doing well with regard to her wound. This is showing some signs of improvement which is great news still were not quite at the point where I would like to be as far as the overall appearance of the wound is concerned but I do believe this is better than last week. I do believe the Iodoflex is helping as well. 03/08/2020 upon evaluation today patient appears to be doing well with regard to her wound. She has been tolerating the dressing changes without complication. Fortunately I feel like she has made great progress with the Iodoflex but I feel like it may be the point rest to switch to something else possibly a collagen- based dressing at this time. 03/15/2020 upon evaluation today patient appears to be doing excellent in regard to her leg ulcer. She has been tolerating the dressing changes without complication. Fortunately there is no signs of active infection. Overall she is measuring a little bit smaller today which is great news. 03/22/2020 upon evaluation today patient appears to be doing well with regard to her wound. She has been tolerating the dressing changes without complication. Fortunately there is no signs of  active infection at this time. 03/28/2020; patient I do not usually see however she has a wound on the left anterior lower leg secondary to chronic venous insufficiency we have been using silver collagen under compression. She arrives in clinic with a nonviable surface requiring debridement 04/12/2020 upon evaluation today patient appears to be doing well all things considered with regard to her leg ulcer. She is tolerating the dressing changes without complication there is minimal dry skin around the edges of the wound that may be trapping and stopping some of the events of the new skin I am can work on that today. Otherwise the surface of the wound appears to be doing excellent. 04/19/2020 upon evaluation today patient appears to be doing well with regard to her leg ulcer. She has been tolerating dressing changes without complication. Fortunately there is no signs of active infection at this time. No fever chills noted overall very pleased with how things seem to be progressing. 04/26/2020 on evaluation today patient appears to be doing well with regard to her wound currently. Is showing signs of excellent improvement overall is filling in nicely and there  does not appear to be any signs of infection. No fevers, chills, nausea, vomiting, or diarrhea. 05/03/2020 upon evaluation today patient appears to be doing well with regard to her wound on the leg. This overall showing signs of good improvement which is great she has some good epithelial growth and overall I think that things are moving in the correct direction. We likewise going to continue with the wound care measures as before since she seems to making such good improvement. 2/3; venous wound on the left medial leg. This is contracting. We are using Prisma and 3 layer compression. She has a stocking and waiting in the eventuality this heals. She is already using it on the right 05/17/2020 upon evaluation today patient appears to be doing well with  regard to her leg ulcer. She has been tolerating the dressing changes without complication. Fortunately there is no signs of active infection which is great news and overall very pleased with where things stand today. No fevers, chills, nausea, vomiting, or diarrhea. 05/24/2020 upon evaluation today patient appears to be doing well with regard to her wound. Overall I feel like she is making excellent progress. There does not appear to be any signs of active infection which is great news. 05/31/2020 upon evaluation today patient appears to be doing well with regard to her wound. There does not appear to be any signs of active infection which is great news overall I am extremely pleased with where things stand today. READMISSION 01/23/2022 She returns to clinic today with a new wound on her right posterior calf. She says that she was cleaning out an old shed near the middle of August this year and then noticed what seemed to be a bug bite on her right posterior calf. It was itchy, red, and raised. By the end of September, an ulcer had developed. She has AZAIAH, MELLO ([id number]) [id number] Page 7 of 12 been applying various topical creams such as hydrocortisone and others to the site. When it was not improving, she made an appointment in the wound care center. ABI in clinic today was 0.97. On her right posterior calf, there is a circular wound with necrotic fat and black eschar present. There is no purulent drainage or malodor. The periwound skin is in good condition with just a little induration that appears to be secondary to inflammation. 01/31/2022: The wound measures a little bit larger today, but overall is quite a bit cleaner. There is some undermining from 9:00 to 3:00. She is having some periwound itching and says that her wrap slid. 02/08/2022: Despite using an Unna boot first layer at the top of her wrap, it slid again and it looks like there is been some  bruising at the wound site. The wound is about the same size in terms of dimensions. There is a fair amount of slough and nonviable tissue still present. Edema control is better than last week. 02/15/2022: The wound dimensions are about the same. There is less nonviable tissue present. The periwound erythema has improved. 02/22/2022: The wound has deteriorated over the past week. It is larger and the periwound is more edematous, erythematous, and indurated. It looks as though there has been more tissue breakdown with undermining present. She is having more pain. There is a foul odor coming from the wound. 02/26/2022: The wound looks quite a bit better today and the odor is gone. I still do not have her culture data back, but she seems to be responding well to the Augmentin.  03/06/2022: Her wound continues to improve. There is still some slough accumulation, but the periwound skin is less inflamed. Her culture returned with a polymicrobial population including Pseudomonas and so levofloxacin was also prescribed. She did not understand why a second antibiotic was being added so she has not yet initiated this. 03/14/2022: The wound is about the same, to perhaps a little bit larger. There is a fair amount of slough accumulation on the surface, as well as some hypertrophic granulation tissue. She is still taking levofloxacin, but has completed taking Augmentin. She has her Jodie Echevaria compound with her today. 12/14; the patient has a significant circular wound on the posterior right calf. She is using Keystone and silver alginate under 3 layer compression. Her ABIs were within normal limits at 0.97. This may have been traumatic or an insect bite at the start I reviewed these records. 04/04/2022: Since I last saw the wound, it has contracted considerably. There is a fairly thick layer of slough on the surface, as it has not been debrided since the last time I did it. Edema control is excellent and the periwound  skin is in much better condition. 04/12/2022: No significant change in the wound dimensions. It is filling with granulation tissue. Still with slough accumulation on the surface. 04/19/2022: The wound is smaller this week. There is some slough accumulation on the surface. 04/26/2022: The wound is smaller again this week and significantly cleaner. The wound surface is a little bit drier than ideal. 05/03/2022: The wound measurements were about the same, but visually it appears smaller. There is still some undermining at the top of the wound. Moisture balance is better this week. 05/10/2022: The wound measured smaller today. There is still a fair amount of undermining present. Slough has built up on the surface. We are still awaiting snap VAC approval. 05/17/2022: The wound is a little bit smaller today. There is less slough on the surface, but the granulation tissue still is not very robust. She has been approved for snap VAC but will have a 20% coinsurance and it is not clear what that amount would end up being for her. 05/27/2022: The surface of the wound has deteriorated and it is gray and fibrotic. No significant odor, but the drainage on her dressing was a little bit purulent. 05/31/2022: The wound looks quite a bit better today. It is still a little fibrotic but no longer has purulent-looking drainage. The color is better. She spoke with her insurance company and is interested in trying the snap VAC, now that she is aware of the cost to her. 06/07/2022: After 1 week in the snap VAC, there has been substantial improvement to her wound. The undermined portion is closing in. The surface has a healthier color and appearance. There is very minimal slough accumulation. 06/14/2022: The more shallow part of the undermined portion of her wound has closed. There is still some undermining from about 1-2 o'clock. The surface continues to improve. Minimal slough accumulation. 06/28/2022: The wound is shallower this week  and the undermining has essentially closed and completely. The surface tissue is improving. 07/05/2022: The depth is almost immeasurable and the surface is nearly flush with the surrounding skin. The undermining has been eliminated. There is light slough on the wound surface. 07/12/2022: There is a little bit of slough on the wound surface. The depth is about the same as last week. 07/19/2022: The wound is essentially flush with the surrounding skin surface. There is slight slough present. No real change  in the AP or transverse dimensions. 07/26/2022: The wound measured a little bit smaller today. It is flush with the surrounding skin surface and there is good granulation tissue present. Minimal slough and biofilm accumulation. 08/02/2022: The right posterior leg wound is a little bit smaller. There is a little slough accumulation. She reported a new wound to [place] today. It is on her left posterior calf. It has been present for about 6 weeks. She is not sure of how it occurred. She has been trying to manage it at home with topical Neosporin and Band-Aids. The fat layer is exposed. The surface is fibrotic and covered with slough. 08/09/2022: The right posterior leg wound is smaller again this week. There is good granulation tissue on the surface with minimal slough accumulation. The left posterior calf wound has built up a thick layer of slough. It is still quite fibrotic underneath the slough, but there is a little bit of a pink color beginning to emerge. Edema control is good. 08/16/2022: Both wounds are smaller this week. There is minimal slough on the right posterior leg wound. There is slough that has reaccumulated on the left posterior calf wound but there is a little bit more granulation tissue emerging. 08/23/2022: Both wounds are about the same size this week, but the quality of the tissue and amount of granulation tissue on the left are both better. 08/30/2022: Both wounds measure smaller today, but  there has been some tissue breakdown adjacent to the left leg wound. She says it has been itchy. There is a little bit of slough on the surface of both wounds. 09/06/2022: The right wound measures smaller today. There is minimal slough on the wound surface. The left wound is about the same size. The periwound tissue looks better this week. There is still a layer of fibrinous slough on the left wound. Edema control is good bilaterally. 6/7 the patient has wounds on bilateral calfs. We are using silver alginate on the right Iodoflex on the left under Urgo lite compression. The wounds are measuring smaller. 09/20/2022: The right wound is flush with the surrounding skin. There is good granulation tissue with some biofilm and thin eschar present. On the left, there is [name], [name] ([id number]) [id number] Page 8 of 12 some slough accumulation and the patient says it has been a little itchy this week. 09/27/2022: No significant change to the right wound. The left wound measures a little smaller, but still has some depth to it with slough accumulation. 10/04/2022: Nice improvement in both wounds. They are smaller and more superficial. The left wound has better granulation tissue and no longer has the hard fibrous surface. 10/11/2022: Both wounds continue to contract. There is minimal slough and eschar present. Granulation tissue continues to fill in on the left. Edema control is good. 10/21/2022: The right leg wound is about half the size as it was last week. There is a little perimeter eschar and some light slough on the surface. The left leg wound is about the same size, but shallower. There is still slough accumulation but the buds of granulation tissue are getting larger. Edema control remains excellent. 10/30/2022: The right leg wound is healed. The left leg wound is smaller with better granulation tissue and only a little bit of slough present. Edema control  is excellent. 11/07/2022: The left leg wound continues to contract and the surface continues to improve. Minimal slough with good granulation tissue filling in. 11/13/2022: Most of the left leg wound has epithelialized. There  is a small open area remaining that is about a millimeter in each dimension. There is slough accumulation present. 11/20/2022: There is a little bit of slough on the wound surface. It is about the same size as last week. Edema control is good. 11/27/2022: A tiny opening remains with slough on the surface. Edema control remains excellent. 12/05/2022: A small opening remains underneath some eschar and slough. 12/12/2022: No difference in the wound on her left leg. Unfortunately, over the weekend, her right leg wound reopened. It is a little over a centimeter in diameter and very superficial with some slough on the surface. 12/18/2022: Both wounds are larger today. There is increased slough on both surfaces, as well. Although edema control seems adequate, she is on her feet quite a bit more due to returning to school and being on her feet much of the day. 12/25/2022: The left calf wound is stable, but the right calf wound is considerably smaller. Both have a little bit of slough present. Edema control is adequate. 01/01/2023: Both wounds are smaller today. The right calf wound is nearly closed under some eschar. Edema control is adequate. 01/08/2023: The right calf wound is healed. The wound is smaller and more superficial. Edema control is good. She did receive the second sleeve so now she has bilateral lymphedema pumps and she has been using them. 01/15/2023: The left calf wound is down to just a couple of millimeters and is very superficial. Edema control is excellent. Patient History Information obtained from Patient. Family History Cancer - Mother, Diabetes - Father, Hypertension - Mother, Kidney Disease - Mother, Lung Disease - Father, Thyroid Problems - Paternal Grandparents, No  family history of Heart Disease, Seizures, Stroke, Tuberculosis. Social History Never smoker, Marital Status - Married, Alcohol Use - Never, Drug Use - No History, Caffeine Use - Daily - coffee. Medical History Eyes Denies history of Cataracts, Glaucoma, Optic Neuritis Ear/Nose/Mouth/Throat Denies history of Chronic sinus problems/congestion, Middle ear problems Hematologic/Lymphatic Patient has history of Anemia - iron Denies history of Hemophilia, Human Immunodeficiency Virus, Lymphedema, Sickle Cell Disease Respiratory Patient has history of Sleep Apnea - CPAP Denies history of Aspiration, Asthma, Chronic Obstructive Pulmonary Disease (COPD), Pneumothorax, Tuberculosis Cardiovascular Patient has history of Hypertension, Peripheral Venous Disease Denies history of Angina, Arrhythmia, Congestive Heart Failure, Coronary Artery Disease, Deep Vein Thrombosis, Hypotension, Myocardial Infarction, Peripheral Arterial Disease, Phlebitis, Vasculitis Gastrointestinal Denies history of Cirrhosis , Colitis, Crohns, Hepatitis A, Hepatitis B, Hepatitis C Endocrine Denies history of Type I Diabetes, Type II Diabetes Genitourinary Denies history of End Stage Renal Disease Immunological Denies history of Lupus Erythematosus, Raynauds, Scleroderma Integumentary (Skin) Denies history of History of Burn Musculoskeletal Denies history of Gout, Rheumatoid Arthritis, Osteoarthritis, Osteomyelitis Neurologic Denies history of Dementia, Neuropathy, Quadriplegia, Paraplegia, Seizure Disorder Oncologic Denies history of Received Chemotherapy, Received Radiation Psychiatric Denies history of Anorexia/bulimia, Confinement Anxiety Hospitalization/Surgery History - cholecystectomy 1980s. - nephrolithasis 1980s. - plate and rod in right elbow surgery 2010. Medical A Surgical History Notes nd Genitourinary [name], [name] ([id number]) [phone].pdf Page 9 of 12 years ago kidney  stones Oncologic skin Ca removed from back years ago Objective Constitutional no acute distress. Vitals Time Taken: 3:33 PM, Height: 61 in, Weight: 277 lbs, BMI: 52.3, Temperature: 97.9 F, Pulse: 94 bpm, Respiratory Rate: 20 breaths/min, Blood Pressure: 128/65 mmHg. Respiratory Normal work of breathing on room air. General Notes: 01/15/2023: The left calf wound is down to just a couple of millimeters and is very superficial. Edema control is excellent. Integumentary (Hair,  Skin) Wound #3 status is Open. Original cause of wound was Trauma. The date acquired was: 06/28/2022. The wound has been in treatment 23 weeks. The wound is located on the Left,Posterior Lower Leg. The wound measures 0.4cm length x 0.1cm width x 0.1cm depth; 0.031cm^2 area and 0.003cm^3 volume. There is Fat Layer (Subcutaneous Tissue) exposed. There is no tunneling or undermining noted. There is a small amount of serosanguineous drainage noted. The wound margin is flat and intact. There is large (67-100%) red granulation within the wound bed. There is no necrotic tissue within the wound bed. The periwound skin appearance had no abnormalities noted for texture. The periwound skin appearance had no abnormalities noted for moisture. The periwound skin appearance had no abnormalities noted for color. Periwound temperature was noted as No Abnormality. Assessment Active Problems ICD-10 Non-pressure chronic ulcer of left calf with fat layer exposed Lymphedema, not elsewhere classified Venous insufficiency (chronic) (peripheral) Morbid (severe) obesity due to excess calories Essential (primary) hypertension Procedures Wound #3 Pre-procedure diagnosis of Wound #3 is a Venous Leg Ulcer located on the Left,Posterior Lower Leg . There was a Double Layer Compression Therapy Procedure by [name], RN. Post procedure Diagnosis Wound #3: Same as Pre-Procedure Notes: URGO. Plan Follow-up Appointments: Return Appointment in 1  week. - Dr. Lady Gary - room 1 Wed 10/16 @ 2:45 pm Anesthetic: (In clinic) Topical Lidocaine 4% applied to wound bed Bathing/ Shower/ Hygiene: May shower with protection but do not get wound dressing(s) wet. Protect dressing(s) with water repellant cover (for example, large plastic bag) or a cast cover and may then take shower. Edema Control - Lymphedema / SCD / Other: Lymphedema Pumps. Use Lymphedema pumps on leg(s) 2-3 times a day for 45-60 minutes. If wearing any wraps or hose, do not remove them. Continue exercising as instructed. Elevate legs to the level of the heart or above for 30 minutes daily and/or when sitting for 3-4 times a day throughout the day. Avoid standing for long periods of time. Exercise regularly Moisturize legs daily. Compression stocking or Garment 20-30 mm/Hg pressure to: - right leg daily WOUND #3: - Lower Leg Wound Laterality: Left, Posterior Cleanser: Soap and Water 1 x Per [name], [name] ([id number]) [id number] Page 10 of 12 Discharge Instructions: May shower and wash wound with dial antibacterial soap and water prior to dressing change. Cleanser: Wound Cleanser 1 x Per Week/ Discharge Instructions: Cleanse the wound with wound cleanser prior to applying a clean dressing using gauze sponges, not tissue or cotton balls. Peri-Wound Care: Triamcinolone 15 (g) 1 x Per Week/ Discharge Instructions: Use triamcinolone 15 (g) as directed Peri-Wound Care: Sween Lotion (Moisturizing lotion) 1 x Per Week/ Discharge Instructions: Apply moisturizing lotion as directed Topical: Gentamicin 1 x Per Week/ Discharge Instructions: As directed by physician Topical: Mupirocin Ointment 1 x Per Week/ Discharge Instructions: Apply Mupirocin (Bactroban) as instructed Prim Dressing: Maxorb Extra Ag+ Alginate Dressing, 2x2 (in/in) 1 x Per Week/ ary Discharge Instructions: Apply to wound bed as instructed Secondary Dressing: Woven Gauze Sponges 2x2  in 1 x Per Week/ Discharge Instructions: Apply over primary dressing as directed. Com pression Wrap: Urgo K2, (equivalent to a 4 layer) two layer compression system, regular 1 x Per Week/ Discharge Instructions: Apply Urgo K2 as directed (alternative to 4 layer compression). 01/15/2023: The left calf wound is down to just a couple of millimeters and is very superficial. Edema control is excellent. The wound was extremely clean and did not require debridement. We will apply topical gentamicin  and mupirocin with some silver alginate and a 4-layer compression equivalent wrap. Hopefully she will be healed at her visit next week. Electronic Signature(s) Signed: 01/15/2023 4:30:27 PM By: [name] Guess MD FACS Entered By: [name] on 01/15/2023 13:30:27 -------------------------------------------------------------------------------- HxROS Details Patient Name: Date of Service: [name], [name] 01/15/2023 3:30 PM Medical Record Number: [id number] Patient Account Number: [id number] Date of Birth/Sex: Treating RN: 05-Nov-1960 (62 y.o. F) Primary Care Provider: Nira [name] Other Clinician: Referring Provider: Treating Provider/Extender: [name] in Treatment: 71 Information Obtained From Patient Eyes Medical History: Negative for: Cataracts; Glaucoma; Optic Neuritis Ear/Nose/Mouth/Throat Medical History: Negative for: Chronic sinus problems/congestion; Middle ear problems Hematologic/Lymphatic Medical History: Positive for: Anemia - iron Negative for: Hemophilia; Human Immunodeficiency Virus; Lymphedema; Sickle Cell Disease Respiratory Medical History: Positive for: Sleep Apnea - CPAP Negative for: Aspiration; Asthma; Chronic Obstructive Pulmonary Disease (COPD); Pneumothorax; Tuberculosis Cardiovascular Medical History: Positive for: Hypertension; Peripheral Venous Disease Negative for: Angina; Arrhythmia; Congestive Heart Failure; Coronary  Artery Disease; Deep Vein Thrombosis; Hypotension; Myocardial Infarction; Peripheral Arterial Disease; Phlebitis; Vasculitis [name], [name] ([id number]) [phone].pdf Page 11 of 12 Gastrointestinal Medical History: Negative for: Cirrhosis ; Colitis; Crohns; Hepatitis A; Hepatitis B; Hepatitis C Endocrine Medical History: Negative for: Type I Diabetes; Type II Diabetes Genitourinary Medical History: Negative for: End Stage Renal Disease Past Medical History Notes: years ago kidney stones Immunological Medical History: Negative for: Lupus Erythematosus; Raynauds; Scleroderma Integumentary (Skin) Medical History: Negative for: History of Burn Musculoskeletal Medical History: Negative for: Gout; Rheumatoid Arthritis; Osteoarthritis; Osteomyelitis Neurologic Medical History: Negative for: Dementia; Neuropathy; Quadriplegia; Paraplegia; Seizure Disorder Oncologic Medical History: Negative for: Received Chemotherapy; Received Radiation Past Medical History Notes: skin Ca removed from back years ago Psychiatric Medical History: Negative for: Anorexia/bulimia; Confinement Anxiety Immunizations Pneumococcal Vaccine: Received Pneumococcal Vaccination: No Implantable Devices None Hospitalization / Surgery History Type of Hospitalization/Surgery cholecystectomy 1980s nephrolithasis 1980s plate and rod in right elbow surgery 2010 Family and Social History Cancer: Yes - Mother; Diabetes: Yes - Father; Heart Disease: No; Hypertension: Yes - Mother; Kidney Disease: Yes - Mother; Lung Disease: Yes - Father; Seizures: No; Stroke: No; Thyroid Problems: Yes - Paternal Grandparents; Tuberculosis: No; Never smoker; Marital Status - Married; Alcohol Use: Never; Drug Use: No History; Caffeine Use: Daily - coffee; Financial Concerns: No; Food, Clothing or Shelter Needs: No; Support System Lacking: No; Transportation Concerns: No Electronic Signature(s) Signed:  01/16/2023 7:55:21 AM By: [name] Guess MD FACS Entered By: [name] on 01/15/2023 13:27:32 [name] ([id number]) [id number] Page 12 of 12 -------------------------------------------------------------------------------- SuperBill Details Patient Name: Date of Service: [name] 01/15/2023 Medical Record Number: [id number] Patient Account Number: [id number] Date of Birth/Sex: Treating RN: November 23, 1960 (62 y.o. [name] Primary Care Provider: Nira [name] Other Clinician: Referring Provider: Treating Provider/Extender: [name] in Treatment: 51 Diagnosis Coding ICD-10 Codes Code Description [phone] Non-pressure chronic ulcer of left calf with fat layer exposed I89.0 Lymphedema, not elsewhere classified I87.2 Venous insufficiency (chronic) (peripheral) E66.01 Morbid (severe) obesity due to excess calories I10 Essential (primary) hypertension Facility Procedures : CPT4 Code: 16073710 Description: (Facility Use Only) 29581LT - APPLY MULTLAY COMPRS LWR LT LEG Modifier: Quantity: 1 Physician Procedures : CPT4 Code Description Modifier [id number] - WC PHYS LEVEL 4 - EST PT ICD-10 Diagnosis Description L97.222 Non-pressure chronic ulcer of left calf with fat layer exposed I89.0 Lymphedema, not elsewhere classified I87.2 Venous insufficiency (chronic)  (peripheral) E66.01 Morbid (severe) obesity due to excess calories Quantity: 1  Electronic Signature(s) Signed: 01/15/2023 4:30:41 PM By: [name] Guess MD FACS Previous Signature: 01/15/2023 4:30:25 PM Version By: [name] RN, BSN Entered By: [name] on 01/15/2023 13:30:41

## 2023-01-17 ENCOUNTER — Other Ambulatory Visit: Payer: Self-pay | Admitting: Family Medicine

## 2023-01-17 DIAGNOSIS — M810 Age-related osteoporosis without current pathological fracture: Secondary | ICD-10-CM

## 2023-01-22 ENCOUNTER — Encounter (HOSPITAL_BASED_OUTPATIENT_CLINIC_OR_DEPARTMENT_OTHER): Payer: BC Managed Care – PPO | Admitting: General Surgery

## 2023-01-22 DIAGNOSIS — L97222 Non-pressure chronic ulcer of left calf with fat layer exposed: Secondary | ICD-10-CM | POA: Diagnosis not present

## 2023-01-23 NOTE — Progress Notes (Signed)
[name] [name], [name] [name] ([id number]) [id number] Page 1 of 7 Visit Report for 01/22/2023 Arrival Information Details Patient Name: Date of Service: [name] [name] [name] 01/22/2023 2:45 PM Medical Record Number: [id number] Patient Account Number: [id number] Date of Birth/Sex: Treating RN: 03-May-1960 (62 y.o. [name], [name] Primary Care Ciani Rutten: Nira Conn Other Clinician: Referring Nikeya Maxim: Treating Dezaria Methot/Extender: Andrey Campanile in Treatment: 52 Visit Information History Since Last Visit Added or deleted any medications: No Patient Arrived: Ambulatory Any new allergies or adverse reactions: No Arrival Time: 15:10 Had a fall or experienced change in No Accompanied By: self activities of daily living that may affect Transfer Assistance: None risk of falls: Patient Identification Verified: Yes Signs or symptoms of abuse/neglect since last visito No Secondary Verification Process Completed: Yes Hospitalized since last visit: No Patient Requires Transmission-Based Precautions: No Implantable device outside of the clinic excluding No Patient Has Alerts: No cellular tissue based products placed in the center since last visit: Has Dressing in Place as Prescribed: Yes Has Compression in Place as Prescribed: Yes Pain Present Now: Yes Electronic Signature(s) Signed: 01/22/2023 5:25:43 PM By: [name] RN, BSN Entered By: [name] on 01/22/2023 12:10:50 -------------------------------------------------------------------------------- Compression Therapy Details Patient Name: Date of Service: [name] [name], [name] [name] 01/22/2023 2:45 PM Medical Record Number: [id number] Patient Account Number: [id number] Date of Birth/Sex: Treating RN: [birth year] (62 y.o. [name] [name] Primary Care Jayliah Benett: Nira Conn Other Clinician: Referring Neila Teem: Treating Hadassah Rana/Extender: Andrey Campanile in  Treatment: 52 Compression Therapy Performed for Wound Assessment: Wound #3 Left,Posterior Lower Leg Performed By: Clinician [name], RN Compression Type: Double Layer Post Procedure Diagnosis Same as Pre-procedure Notes urgo Electronic Signature(s) Signed: 01/22/2023 5:25:43 PM By: [name] RN, BSN Entered By: [name] on 01/22/2023 12:30:45 [name] [name] ([id number]) [id number] Page 2 of 7 -------------------------------------------------------------------------------- Encounter Discharge Information Details Patient Name: Date of Service: [name] [name] 01/22/2023 2:45 PM Medical Record Number: [id number] Patient Account Number: [id number] Date of Birth/Sex: Treating RN: [birth year] (62 y.o. [name] [name] Primary Care Pammie Chirino: Nira Conn Other Clinician: Referring Drisana Schweickert: Treating Yasenia Reedy/Extender: Andrey Campanile in Treatment: 65 Encounter Discharge Information Items Discharge Condition: Stable Ambulatory Status: Ambulatory Discharge Destination: Home Transportation: Private Auto Accompanied By: self Schedule Follow-up Appointment: Yes Clinical Summary of Care: Patient Declined Electronic Signature(s) Signed: 01/22/2023 5:25:43 PM By: [name] RN, BSN Entered By: [name] on 01/22/2023 14:09:15 -------------------------------------------------------------------------------- Lower Extremity Assessment Details Patient Name: Date of Service: [name] [name], [name] [name] 01/22/2023 2:45 PM Medical Record Number: [id number] Patient Account Number: [id number] Date of Birth/Sex: Treating RN: [birth year] (62 y.o. [name] [name] Primary Care Areliz Rothman: Nira Conn Other Clinician: Referring Tadao Emig: Treating Jacorion Klem/Extender: Andrey Campanile in Treatment: 52 Edema Assessment Assessed: [Left: No] [Right: No] Edema: [Left: Ye] [Right:  s] Calf Left: Right: Point of Measurement: From Medial Instep 38.5 cm Ankle Left: Right: Point of Measurement: From Medial Instep 22 cm Vascular Assessment Pulses: Dorsalis Pedis Palpable: [Left:Yes] Extremity colors, hair growth, and conditions: Extremity Color: [Left:Hyperpigmented] Hair Growth on Extremity: [Left:No] Temperature of Extremity: [Left:Warm < 3 seconds] Electronic Signature(s) Signed: 01/22/2023 5:25:43 PM By: [name] RN, BSN Entered By: [name] on 01/22/2023 12:17:32 [name] [name] ([id number]) [id number] Page 3 of 7 -------------------------------------------------------------------------------- Multi Wound Chart Details Patient Name: Date of Service: [name] [name] 01/22/2023 2:45 PM Medical Record Number: [id number] Patient Account Number: [id number] Date of Birth/Sex: Treating RN: 08-02-1960 (62 y.o. F) F) Primary  Care Takyah Ciaramitaro: Nira Conn Other Clinician: Referring Murielle Stang: Treating Wanetta Funderburke/Extender: Andrey Campanile in Treatment: 52 Vital Signs Height(in): 61 Pulse(bpm): 94 Weight(lbs): 277 Blood Pressure(mmHg): 129/75 Body Mass Index(BMI): 52.3 Temperature(F): 98.1 Respiratory Rate(breaths/min): 20 [3:Photos:] [N/A:N/A] Left, Posterior Lower Leg N/A N/A Wound Location: Trauma N/A N/A Wounding Event: Venous Leg Ulcer N/A N/A Primary Etiology: Anemia, Sleep Apnea, Hypertension, N/A N/A Comorbid History: Peripheral Venous Disease 06/28/2022 N/A N/A Date Acquired: 24 N/A N/A Weeks of Treatment: Open N/A N/A Wound Status: No N/A N/A Wound Recurrence: 0.3x0.2x0.1 N/A N/A Measurements L x W x D (cm) 0.047 N/A N/A A (cm) : rea 0.005 N/A N/A Volume (cm) : 96.50% N/A N/A % Reduction in Area: 96.20% N/A N/A % Reduction in Volume: Full Thickness Without Exposed N/A N/A Classification: Support Structures None Present N/A N/A Exudate Amount: Flat and Intact  N/A N/A Wound Margin: Large (67-100%) N/A N/A Granulation Amount: Pink N/A N/A Granulation Quality: None Present (0%) N/A N/A Necrotic Amount: Fat Layer (Subcutaneous Tissue): Yes N/A N/A Exposed Structures: Fascia: No Tendon: No Muscle: No Joint: No Bone: No Large (67-100%) N/A N/A Epithelialization: Rash: Yes N/A N/A Periwound Skin Texture: No Abnormalities Noted N/A N/A Periwound Skin Moisture: Erythema: No N/A N/A Periwound Skin Color: No Abnormality N/A N/A Temperature: Compression Therapy N/A N/A Procedures Performed: Treatment Notes Electronic Signature(s) Signed: 01/22/2023 3:43:14 PM By: [name] MD FACS Entered By: [name] on 01/22/2023 12:43:14 [name] [name] ([id number]) [id number] Page 4 of 7 -------------------------------------------------------------------------------- Multi-Disciplinary Care Plan Details Patient Name: Date of Service: [name] [name] 01/22/2023 2:45 PM Medical Record Number: [id number] Patient Account Number: [id number] Date of Birth/Sex: Treating RN: 1960/11/23 (62 y.o. [name] [name] Primary Care Kalib Bhagat: Nira Conn Other Clinician: Referring Arlen Legendre: Treating Syrina Wake/Extender: Andrey Campanile in Treatment: 19 Multidisciplinary Care Plan reviewed with physician Active Inactive Venous Leg Ulcer Nursing Diagnoses: Knowledge deficit related to disease process and management Potential for venous Insuffiency (use before diagnosis confirmed) Goals: Patient will maintain optimal edema control Date Initiated: 07/05/2022 Target Resolution Date: 01/29/2023 Goal Status: Active Interventions: Assess peripheral edema status every visit. Compression as ordered Provide education on venous insufficiency Treatment Activities: Therapeutic compression applied : 07/05/2022 Notes: Electronic Signature(s) Signed: 01/22/2023 5:25:43 PM By: [name] RN,  BSN Entered By: [name] on 01/22/2023 12:24:28 -------------------------------------------------------------------------------- Pain Assessment Details Patient Name: Date of Service: [name] [name], [name] [name] 01/22/2023 2:45 PM Medical Record Number: [id number] Patient Account Number: [id number] Date of Birth/Sex: Treating RN: 02/24/1961 (62 y.o. [name] [name] Primary Care Macio Kissoon: Nira Conn Other Clinician: Referring Katira Dumais: Treating Romen Yutzy/Extender: Andrey Campanile in Treatment: 52 Active Problems Location of Pain Severity and Description of Pain Patient Has Paino Yes Site Locations Pain Location: ARZELLA, REHMANN ([id number]) [id number] Page 5 of 7 Pain Location: Generalized Pain With Dressing Change: No Duration of the Pain. Constant / Intermittento Intermittent Rate the pain. Current Pain Level: 4 Worst Pain Level: 5 Least Pain Level: 0 Character of Pain Describe the Pain: Aching Pain Management and Medication Current Pain Management: Medication: Yes How does your wound impact your activities of daily livingo Sleep: No Bathing: No Appetite: No Relationship With Others: No Bladder Continence: No Emotions: No Bowel Continence: No Work: No Toileting: No Drive: No Dressing: No Hobbies: No Notes reports pain in bunion of left foot Electronic Signature(s) Signed: 01/22/2023 5:25:43 PM By: [name] RN, BSN Entered By: [name] on 01/22/2023 12:14:17 -------------------------------------------------------------------------------- Patient/Caregiver Education Details Patient Name: Date  of Service: [name] [name] [name] 10/16/2024andnbsp2:45 PM Medical Record Number: [id number] Patient Account Number: [id number] Date of Birth/Gender: Treating RN: [name] [name] 03, 1962 (61 y.o. [name] [name] Primary Care Physician: Nira Conn Other Clinician: Referring Physician: Treating  Physician/Extender: Andrey Campanile in Treatment: 42 Education Assessment Education Provided To: Patient Education Topics Provided Venous: Methods: Explain/Verbal Responses: Reinforcements needed, State content correctly Wound/Skin Impairment: Methods: Explain/Verbal Responses: Reinforcements needed, State content correctly [name] [name], [name] [name] ([id number]) [id number] Page 6 of 7 Electronic Signature(s) Signed: 01/22/2023 5:25:43 PM By: [name] RN, BSN Entered By: [name] on 01/22/2023 12:24:50 -------------------------------------------------------------------------------- Wound Assessment Details Patient Name: Date of Service: [name] [name], [name] [name] 01/22/2023 2:45 PM Medical Record Number: [id number] Patient Account Number: [id number] Date of Birth/Sex: Treating RN: June 30, 1960 (62 y.o. [name] [name] Primary Care Bina Veenstra: Nira Conn Other Clinician: Referring Bruce Mayers: Treating Lonzell Dorris/Extender: Andrey Campanile in Treatment: 52 Wound Status Wound Number: 3 Primary Venous Leg Ulcer Etiology: Wound Location: Left, Posterior Lower Leg Wound Status: Open Wounding Event: Trauma Comorbid Anemia, Sleep Apnea, Hypertension, Peripheral Venous Date Acquired: 06/28/2022 History: Disease Weeks Of Treatment: 24 Clustered Wound: No Photos Wound Measurements Length: (cm) 0.3 Width: (cm) 0.2 Depth: (cm) 0.1 Area: (cm) 0.047 Volume: (cm) 0.005 % Reduction in Area: 96.5% % Reduction in Volume: 96.2% Epithelialization: Large (67-100%) Tunneling: No Undermining: No Wound Description Classification: Full Thickness Without Exposed Suppor Wound Margin: Flat and Intact Exudate Amount: None Present t Structures Foul Odor After Cleansing: No Slough/Fibrino Yes Wound Bed Granulation Amount: Large (67-100%) Exposed Structure Granulation Quality: Pink Fascia Exposed: No Necrotic Amount:  None Present (0%) Fat Layer (Subcutaneous Tissue) Exposed: Yes Tendon Exposed: No Muscle Exposed: No Joint Exposed: No Bone Exposed: No Periwound Skin Texture Texture Color No Abnormalities Noted: Yes No Abnormalities Noted: Yes Moisture Temperature / Pain No Abnormalities Noted: Yes Temperature: No Abnormality Treatment Notes Wound #3 (Lower Leg) Wound Laterality: Left, Posterior [name] [name], [name] [name] ([id number]) [id number] Page 7 of 7 Cleanser Soap and Water Discharge Instruction: May shower and wash wound with dial antibacterial soap and water prior to dressing change. Wound Cleanser Discharge Instruction: Cleanse the wound with wound cleanser prior to applying a clean dressing using gauze sponges, not tissue or cotton balls. Peri-Wound Care Triamcinolone 15 (g) Discharge Instruction: Use triamcinolone 15 (g) as directed Sween Lotion (Moisturizing lotion) Discharge Instruction: Apply moisturizing lotion as directed Topical Gentamicin Discharge Instruction: As directed by physician Mupirocin Ointment Discharge Instruction: Apply Mupirocin (Bactroban) as instructed Primary Dressing Maxorb Extra Ag+ Alginate Dressing, 2x2 (in/in) Discharge Instruction: Apply to wound bed as instructed Secondary Dressing Woven Gauze Sponges 2x2 in Discharge Instruction: Apply over primary dressing as directed. Secured With Compression Wrap Urgo K2, (equivalent to a 4 layer) two layer compression system, regular Discharge Instruction: Apply Urgo K2 as directed (alternative to 4 layer compression). Compression Stockings Add-Ons Electronic Signature(s) Signed: 01/22/2023 5:25:43 PM By: [name] RN, BSN Entered By: [name] on 01/22/2023 12:25:03 -------------------------------------------------------------------------------- Vitals Details Patient Name: Date of Service: [name] [name], [name] [name] 01/22/2023 2:45 PM Medical Record Number: [id number] Patient  Account Number: [id number] Date of Birth/Sex: Treating RN: 1961/03/12 (62 y.o. [name] [name] Primary Care Kemonie Cutillo: Nira Conn Other Clinician: Referring Akshar Starnes: Treating Kurstin Dimarzo/Extender: Andrey Campanile in Treatment: 52 Vital Signs Time Taken: 15:10 Temperature (F): 98.1 Height (in): 61 Pulse (bpm): 94 Weight (lbs): 277 Respiratory Rate (breaths/min): 20 Body Mass Index (BMI): 52.3 Blood Pressure (mmHg): 129/75 Reference Range: 80 - 120 mg /  dl Electronic Signature(s) Signed: 01/22/2023 5:25:43 PM By: [name] RN, BSN Entered By: [name] on 01/22/2023 12:13:13

## 2023-01-23 NOTE — Progress Notes (Signed)
[name], [name] ([id number]) [id number] Page 1 of 12 Visit Report for 01/22/2023 Chief Complaint Document Details Patient Name: Date of Service: [name] 01/22/2023 2:45 PM Medical Record Number: [id number] Patient Account Number: [id number] Date of Birth/Sex: Treating RN: Apr 01, 1961 (62 y.o. F) Primary Care Provider: Nira Conn Other Clinician: Referring Provider: Treating Provider/Extender: Andrey Campanile in Treatment: 52 Information Obtained from: Patient Chief Complaint RLE ulcer Electronic Signature(s) Signed: 01/22/2023 3:43:22 PM By: [name] MD FACS Entered By: [name] on 01/22/2023 12:43:22 -------------------------------------------------------------------------------- HPI Details Patient Name: Date of Service: [name], [name] 01/22/2023 2:45 PM Medical Record Number: [id number] Patient Account Number: [id number] Date of Birth/Sex: Treating RN: 1960/09/01 (62 y.o. F) Primary Care Provider: Nira Conn Other Clinician: Referring Provider: Treating Provider/Extender: Andrey Campanile in Treatment: 1 History of Present Illness HPI Description: 02/16/2020 upon evaluation today patient actually appears to be doing poorly in regard to her left medial lower extremity ulcer. This is actually an area that she tells me she has had intermittent issues with over the years although has been closed for some time she typically uses compression right now she has juxta lite compression wraps. With that being said she tells me that this nonetheless open several weeks/months ago and has been given her trouble since. She does have a history of chronic venous insufficiency she is seeing specialist for this in the past she has had an ablation as well as sclerotherapy. With that being said she also has hypertension chronically which is managed by her primary care provider. In  general she seems to be worsening overall with regard to the wound and states that she finally realized that she needed to come in and have somebody look at this and not continue to try to manage this on her own. No fevers, chills, nausea, vomiting, or diarrhea. 02/23/2020 on evaluation today patient appears to be doing well with regard to her wound. This is showing some signs of improvement which is great news still were not quite at the point where I would like to be as far as the overall appearance of the wound is concerned but I do believe this is better than last week. I do believe the Iodoflex is helping as well. 03/08/2020 upon evaluation today patient appears to be doing well with regard to her wound. She has been tolerating the dressing changes without complication. Fortunately I feel like she has made great progress with the Iodoflex but I feel like it may be the point rest to switch to something else possibly a collagen- based dressing at this time. 03/15/2020 upon evaluation today patient appears to be doing excellent in regard to her leg ulcer. She has been tolerating the dressing changes without complication. Fortunately there is no signs of active infection. Overall she is measuring a little bit smaller today which is great news. 03/22/2020 upon evaluation today patient appears to be doing well with regard to her wound. She has been tolerating the dressing changes without complication. Fortunately there is no signs of active infection at this time. 03/28/2020; patient I do not usually see however she has a wound on the left anterior lower leg secondary to chronic venous insufficiency we have been using silver collagen under compression. She arrives in clinic with a nonviable surface requiring debridement 04/12/2020 upon evaluation today patient appears to be doing well all things considered with regard to her leg ulcer. She is tolerating the dressing changes without complication there  is  minimal dry skin around the edges of the wound that may be trapping and stopping some of the events of the new skin I am can work on that today. Otherwise the surface of the wound appears to be doing excellent. 04/19/2020 upon evaluation today patient appears to be doing well with regard to her leg ulcer. She has been tolerating dressing changes without complication. Fortunately there is no signs of active infection at this time. No fever chills noted overall very pleased with how things seem to be progressing. [name], [name] ([id number]) [id number] Page 2 of 12 04/26/2020 on evaluation today patient appears to be doing well with regard to her wound currently. Is showing signs of excellent improvement overall is filling in nicely and there does not appear to be any signs of infection. No fevers, chills, nausea, vomiting, or diarrhea. 05/03/2020 upon evaluation today patient appears to be doing well with regard to her wound on the leg. This overall showing signs of good improvement which is great she has some good epithelial growth and overall I think that things are moving in the correct direction. We likewise going to continue with the wound care measures as before since she seems to making such good improvement. 2/3; venous wound on the left medial leg. This is contracting. We are using Prisma and 3 layer compression. She has a stocking and waiting in the eventuality this heals. She is already using it on the right 05/17/2020 upon evaluation today patient appears to be doing well with regard to her leg ulcer. She has been tolerating the dressing changes without complication. Fortunately there is no signs of active infection which is great news and overall very pleased with where things stand today. No fevers, chills, nausea, vomiting, or diarrhea. 05/24/2020 upon evaluation today patient appears to be doing well with regard to her wound. Overall I feel like she is making  excellent progress. There does not appear to be any signs of active infection which is great news. 05/31/2020 upon evaluation today patient appears to be doing well with regard to her wound. There does not appear to be any signs of active infection which is great news overall I am extremely pleased with where things stand today. READMISSION 01/23/2022 She returns to clinic today with a new wound on her right posterior calf. She says that she was cleaning out an old shed near the middle of August this year and then noticed what seemed to be a bug bite on her right posterior calf. It was itchy, red, and raised. By the end of September, an ulcer had developed. She has been applying various topical creams such as hydrocortisone and others to the site. When it was not improving, she made an appointment in the wound care center. ABI in clinic today was 0.97. On her right posterior calf, there is a circular wound with necrotic fat and black eschar present. There is no purulent drainage or malodor. The periwound skin is in good condition with just a little induration that appears to be secondary to inflammation. 01/31/2022: The wound measures a little bit larger today, but overall is quite a bit cleaner. There is some undermining from 9:00 to 3:00. She is having some periwound itching and says that her wrap slid. 02/08/2022: Despite using an Unna boot first layer at the top of her wrap, it slid again and it looks like there is been some bruising at the wound site. The wound is about the same size in terms of  dimensions. There is a fair amount of slough and nonviable tissue still present. Edema control is better than last week. 02/15/2022: The wound dimensions are about the same. There is less nonviable tissue present. The periwound erythema has improved. 02/22/2022: The wound has deteriorated over the past week. It is larger and the periwound is more edematous, erythematous, and indurated. It looks as  though there has been more tissue breakdown with undermining present. She is having more pain. There is a foul odor coming from the wound. 02/26/2022: The wound looks quite a bit better today and the odor is gone. I still do not have her culture data back, but she seems to be responding well to the Augmentin. 03/06/2022: Her wound continues to improve. There is still some slough accumulation, but the periwound skin is less inflamed. Her culture returned with a polymicrobial population including Pseudomonas and so levofloxacin was also prescribed. She did not understand why a second antibiotic was being added so she has not yet initiated this. 03/14/2022: The wound is about the same, to perhaps a little bit larger. There is a fair amount of slough accumulation on the surface, as well as some hypertrophic granulation tissue. She is still taking levofloxacin, but has completed taking Augmentin. She has her Jodie Echevaria compound with her today. 12/14; the patient has a significant circular wound on the posterior right calf. She is using Keystone and silver alginate under 3 layer compression. Her ABIs were within normal limits at 0.97. This may have been traumatic or an insect bite at the start I reviewed these records. 04/04/2022: Since I last saw the wound, it has contracted considerably. There is a fairly thick layer of slough on the surface, as it has not been debrided since the last time I did it. Edema control is excellent and the periwound skin is in much better condition. 04/12/2022: No significant change in the wound dimensions. It is filling with granulation tissue. Still with slough accumulation on the surface. 04/19/2022: The wound is smaller this week. There is some slough accumulation on the surface. 04/26/2022: The wound is smaller again this week and significantly cleaner. The wound surface is a little bit drier than ideal. 05/03/2022: The wound measurements were about the same, but visually it  appears smaller. There is still some undermining at the top of the wound. Moisture balance is better this week. 05/10/2022: The wound measured smaller today. There is still a fair amount of undermining present. Slough has built up on the surface. We are still awaiting snap VAC approval. 05/17/2022: The wound is a little bit smaller today. There is less slough on the surface, but the granulation tissue still is not very robust. She has been approved for snap VAC but will have a 20% coinsurance and it is not clear what that amount would end up being for her. 05/27/2022: The surface of the wound has deteriorated and it is gray and fibrotic. No significant odor, but the drainage on her dressing was a little bit purulent. 05/31/2022: The wound looks quite a bit better today. It is still a little fibrotic but no longer has purulent-looking drainage. The color is better. She spoke with her insurance company and is interested in trying the snap VAC, now that she is aware of the cost to her. 06/07/2022: After 1 week in the snap VAC, there has been substantial improvement to her wound. The undermined portion is closing in. The surface has a healthier color and appearance. There is very minimal slough accumulation.  06/14/2022: The more shallow part of the undermined portion of her wound has closed. There is still some undermining from about 1-2 o'clock. The surface continues to improve. Minimal slough accumulation. 06/28/2022: The wound is shallower this week and the undermining has essentially closed and completely. The surface tissue is improving. 07/05/2022: The depth is almost immeasurable and the surface is nearly flush with the surrounding skin. The undermining has been eliminated. There is light slough on the wound surface. 07/12/2022: There is a little bit of slough on the wound surface. The depth is about the same as last week. 07/19/2022: The wound is essentially flush with the surrounding skin surface. There is  slight slough present. No real change in the AP or transverse dimensions. 07/26/2022: The wound measured a little bit smaller today. It is flush with the surrounding skin surface and there is good granulation tissue present. Minimal slough and biofilm accumulation. [name], [name] ([id number]) [id number] Page 3 of 12 08/02/2022: The right posterior leg wound is a little bit smaller. There is a little slough accumulation. She reported a new wound to [place] today. It is on her left posterior calf. It has been present for about 6 weeks. She is not sure of how it occurred. She has been trying to manage it at home with topical Neosporin and Band-Aids. The fat layer is exposed. The surface is fibrotic and covered with slough. 08/09/2022: The right posterior leg wound is smaller again this week. There is good granulation tissue on the surface with minimal slough accumulation. The left posterior calf wound has built up a thick layer of slough. It is still quite fibrotic underneath the slough, but there is a little bit of a pink color beginning to emerge. Edema control is good. 08/16/2022: Both wounds are smaller this week. There is minimal slough on the right posterior leg wound. There is slough that has reaccumulated on the left posterior calf wound but there is a little bit more granulation tissue emerging. 08/23/2022: Both wounds are about the same size this week, but the quality of the tissue and amount of granulation tissue on the left are both better. 08/30/2022: Both wounds measure smaller today, but there has been some tissue breakdown adjacent to the left leg wound. She says it has been itchy. There is a little bit of slough on the surface of both wounds. 09/06/2022: The right wound measures smaller today. There is minimal slough on the wound surface. The left wound is about the same size. The periwound tissue looks better this week. There is still a layer of fibrinous slough on  the left wound. Edema control is good bilaterally. 6/7 the patient has wounds on bilateral calfs. We are using silver alginate on the right Iodoflex on the left under Urgo lite compression. The wounds are measuring smaller. 09/20/2022: The right wound is flush with the surrounding skin. There is good granulation tissue with some biofilm and thin eschar present. On the left, there is some slough accumulation and the patient says it has been a little itchy this week. 09/27/2022: No significant change to the right wound. The left wound measures a little smaller, but still has some depth to it with slough accumulation. 10/04/2022: Nice improvement in both wounds. They are smaller and more superficial. The left wound has better granulation tissue and no longer has the hard fibrous surface. 10/11/2022: Both wounds continue to contract. There is minimal slough and eschar present. Granulation tissue continues to fill in on the left. Edema control  is good. 10/21/2022: The right leg wound is about half the size as it was last week. There is a little perimeter eschar and some light slough on the surface. The left leg wound is about the same size, but shallower. There is still slough accumulation but the buds of granulation tissue are getting larger. Edema control remains excellent. 10/30/2022: The right leg wound is healed. The left leg wound is smaller with better granulation tissue and only a little bit of slough present. Edema control is excellent. 11/07/2022: The left leg wound continues to contract and the surface continues to improve. Minimal slough with good granulation tissue filling in. 11/13/2022: Most of the left leg wound has epithelialized. There is a small open area remaining that is about a millimeter in each dimension. There is slough accumulation present. 11/20/2022: There is a little bit of slough on the wound surface. It is about the same size as last week. Edema control is good. 11/27/2022: A tiny  opening remains with slough on the surface. Edema control remains excellent. 12/05/2022: A small opening remains underneath some eschar and slough. 12/12/2022: No difference in the wound on her left leg. Unfortunately, over the weekend, her right leg wound reopened. It is a little over a centimeter in diameter and very superficial with some slough on the surface. 12/18/2022: Both wounds are larger today. There is increased slough on both surfaces, as well. Although edema control seems adequate, she is on her feet quite a bit more due to returning to school and being on her feet much of the day. 12/25/2022: The left calf wound is stable, but the right calf wound is considerably smaller. Both have a little bit of slough present. Edema control is adequate. 01/01/2023: Both wounds are smaller today. The right calf wound is nearly closed under some eschar. Edema control is adequate. 01/08/2023: The right calf wound is healed. The wound is smaller and more superficial. Edema control is good. She did receive the second sleeve so now she has bilateral lymphedema pumps and she has been using them. 01/15/2023: The left calf wound is down to just a couple of millimeters and is very superficial. Edema control is excellent. 01/22/2023: The left calf wound remains open, but it is minuscule, superficial, and clean. Edema control remains excellent. She has a bunion on her left first metatarsal that has become red and painful. It seems to have been rubbing on her surgical sandal. She denies a history of gout. There is no open wound in this area. Electronic Signature(s) Signed: 01/22/2023 3:44:45 PM By: [name] MD FACS Entered By: [name] on 01/22/2023 12:44:45 -------------------------------------------------------------------------------- Physical Exam Details Patient Name: Date of Service: [name], [name] 01/22/2023 2:45 PM Medical Record Number: [id number] Patient Account Number:  [id number] Date of Birth/Sex: Treating RN: 09-05-1960 (62 y.o. Lucile Hillmann, Romie Minus ([id number]) [id number] Page 4 of 12 Primary Care Provider: Nira Conn Other Clinician: Referring Provider: Treating Provider/Extender: Andrey Campanile in Treatment: 52 Constitutional . . . . no acute distress. Respiratory Normal work of breathing on room air. Notes 01/22/2023: The left calf wound remains open, but it is minuscule, superficial, and clean. Edema control remains excellent. She has a bunion on her left first metatarsal that has become red and painful. It seems to have been rubbing on her surgical sandal. She denies a history of gout. There is no open wound in this area. Electronic Signature(s) Signed: 01/22/2023 3:45:18 PM By: [name] MD FACS Entered By: Lady Gary,  Victorino Dike on 01/22/2023 12:45:18 -------------------------------------------------------------------------------- Physician Orders Details Patient Name: Date of Service: [name] 01/22/2023 2:45 PM Medical Record Number: [id number] Patient Account Number: [id number] Date of Birth/Sex: Treating RN: [birth year] (62 y.o. [name], [name] Primary Care Provider: Nira Conn Other Clinician: Referring Provider: Treating Provider/Extender: Andrey Campanile in Treatment: 46 The following information was scribed by: [name] The information was scribed for: [name] Verbal / Phone Orders: No Diagnosis Coding ICD-10 Coding Code Description [id number] Non-pressure chronic ulcer of left calf with fat layer exposed I89.0 Lymphedema, not elsewhere classified I87.2 Venous insufficiency (chronic) (peripheral) E66.01 Morbid (severe) obesity due to excess calories I10 Essential (primary) hypertension Follow-up Appointments ppointment in 1 week. - Dr. Lady Gary - room 1 Return A Wed 10/16 @ 2:45 pm Anesthetic (In clinic)  Topical Lidocaine 4% applied to wound bed Bathing/ Shower/ Hygiene May shower with protection but do not get wound dressing(s) wet. Protect dressing(s) with water repellant cover (for example, large plastic bag) or a cast cover and may then take shower. Edema Control - Lymphedema / SCD / Other Lymphedema Pumps. Use Lymphedema pumps on leg(s) 2-3 times a day for 45-60 minutes. If wearing any wraps or hose, do not remove them. Continue exercising as instructed. Elevate legs to the level of the heart or above for 30 minutes daily and/or when sitting for 3-4 times a day throughout the day. Avoid standing for long periods of time. Exercise regularly Moisturize legs daily. Compression stocking or Garment 20-30 mm/Hg pressure to: - right leg daily Additional Orders / Instructions Other: - ibuprofen 600 mg every 6 hrs for 2 days, call clinic Friday if no improvement in left foot redness and pain Wound Treatment Wound #3 - Lower Leg Wound Laterality: Left, Posterior Cleanser: Soap and Water 1 x Per [name], [name] ([id number]) [id number] Page 5 of 12 Discharge Instructions: May shower and wash wound with dial antibacterial soap and water prior to dressing change. Cleanser: Wound Cleanser 1 x Per Week Discharge Instructions: Cleanse the wound with wound cleanser prior to applying a clean dressing using gauze sponges, not tissue or cotton balls. Peri-Wound Care: Triamcinolone 15 (g) 1 x Per Week Discharge Instructions: Use triamcinolone 15 (g) as directed Peri-Wound Care: Sween Lotion (Moisturizing lotion) 1 x Per Week Discharge Instructions: Apply moisturizing lotion as directed Topical: Gentamicin 1 x Per Week Discharge Instructions: As directed by physician Topical: Mupirocin Ointment 1 x Per Week Discharge Instructions: Apply Mupirocin (Bactroban) as instructed Prim Dressing: Maxorb Extra Ag+ Alginate Dressing, 2x2 (in/in) 1 x Per Week ary Discharge  Instructions: Apply to wound bed as instructed Secondary Dressing: Woven Gauze Sponges 2x2 in 1 x Per Week Discharge Instructions: Apply over primary dressing as directed. Compression Wrap: Urgo K2, (equivalent to a 4 layer) two layer compression system, regular 1 x Per Week Discharge Instructions: Apply Urgo K2 as directed (alternative to 4 layer compression). Electronic Signature(s) Signed: 01/23/2023 9:16:50 AM By: [name] MD FACS Entered By: [name] on 01/22/2023 12:47:38 -------------------------------------------------------------------------------- Problem List Details Patient Name: Date of Service: [address] [name] 01/22/2023 2:45 PM Medical Record Number: [id number] Patient Account Number: [id number] Date of Birth/Sex: Treating RN: 18-Apr-1960 (62 y.o. [name] Primary Care Provider: Nira Conn Other Clinician: Referring Provider: Treating Provider/Extender: Andrey Campanile in Treatment: 52 Active Problems ICD-10 Encounter Code Description Active Date MDM Diagnosis L97.222 Non-pressure chronic ulcer of left calf with fat layer exposed 08/02/2022 No Yes I89.0 Lymphedema, not  elsewhere classified 01/23/2022 No Yes I87.2 Venous insufficiency (chronic) (peripheral) 01/23/2022 No Yes E66.01 Morbid (severe) obesity due to excess calories 01/23/2022 No Yes I10 Essential (primary) hypertension 01/23/2022 No Yes SHANEL, PRAZAK ([id number]) [id number] Page 6 of 12 Inactive Problems ICD-10 Code Description Active Date Inactive Date L97.212 Non-pressure chronic ulcer of right calf with fat layer exposed 12/12/2022 01/23/2022 Resolved Problems Electronic Signature(s) Signed: 01/22/2023 3:41:02 PM By: [name] MD FACS Entered By: [name] on 01/22/2023 12:41:01 -------------------------------------------------------------------------------- Progress Note Details Patient Name:  Date of Service: [name], [name] 01/22/2023 2:45 PM Medical Record Number: [id number] Patient Account Number: [id number] Date of Birth/Sex: Treating RN: 09-18-1960 (62 y.o. F) Primary Care Provider: Nira Conn Other Clinician: Referring Provider: Treating Provider/Extender: Andrey Campanile in Treatment: 52 Subjective Chief Complaint Information obtained from Patient RLE ulcer History of Present Illness (HPI) 02/16/2020 upon evaluation today patient actually appears to be doing poorly in regard to her left medial lower extremity ulcer. This is actually an area that she tells me she has had intermittent issues with over the years although has been closed for some time she typically uses compression right now she has juxta lite compression wraps. With that being said she tells me that this nonetheless open several weeks/months ago and has been given her trouble since. She does have a history of chronic venous insufficiency she is seeing specialist for this in the past she has had an ablation as well as sclerotherapy. With that being said she also has hypertension chronically which is managed by her primary care provider. In general she seems to be worsening overall with regard to the wound and states that she finally realized that she needed to come in and have somebody look at this and not continue to try to manage this on her own. No fevers, chills, nausea, vomiting, or diarrhea. 02/23/2020 on evaluation today patient appears to be doing well with regard to her wound. This is showing some signs of improvement which is great news still were not quite at the point where I would like to be as far as the overall appearance of the wound is concerned but I do believe this is better than last week. I do believe the Iodoflex is helping as well. 03/08/2020 upon evaluation today patient appears to be doing well with regard to her wound. She has been tolerating the  dressing changes without complication. Fortunately I feel like she has made great progress with the Iodoflex but I feel like it may be the point rest to switch to something else possibly a collagen- based dressing at this time. 03/15/2020 upon evaluation today patient appears to be doing excellent in regard to her leg ulcer. She has been tolerating the dressing changes without complication. Fortunately there is no signs of active infection. Overall she is measuring a little bit smaller today which is great news. 03/22/2020 upon evaluation today patient appears to be doing well with regard to her wound. She has been tolerating the dressing changes without complication. Fortunately there is no signs of active infection at this time. 03/28/2020; patient I do not usually see however she has a wound on the left anterior lower leg secondary to chronic venous insufficiency we have been using silver collagen under compression. She arrives in clinic with a nonviable surface requiring debridement 04/12/2020 upon evaluation today patient appears to be doing well all things considered with regard to her leg ulcer. She is tolerating the dressing changes without complication there  is minimal dry skin around the edges of the wound that may be trapping and stopping some of the events of the new skin I am can work on that today. Otherwise the surface of the wound appears to be doing excellent. 04/19/2020 upon evaluation today patient appears to be doing well with regard to her leg ulcer. She has been tolerating dressing changes without complication. Fortunately there is no signs of active infection at this time. No fever chills noted overall very pleased with how things seem to be progressing. 04/26/2020 on evaluation today patient appears to be doing well with regard to her wound currently. Is showing signs of excellent improvement overall is filling in nicely and there does not appear to be any signs of infection. No  fevers, chills, nausea, vomiting, or diarrhea. 05/03/2020 upon evaluation today patient appears to be doing well with regard to her wound on the leg. This overall showing signs of good improvement which is great she has some good epithelial growth and overall I think that things are moving in the correct direction. We likewise going to continue with the wound care measures as before since she seems to making such good improvement. 2/3; venous wound on the left medial leg. This is contracting. We are using Prisma and 3 layer compression. She has a stocking and waiting in the eventuality this heals. She is already using it on the right 05/17/2020 upon evaluation today patient appears to be doing well with regard to her leg ulcer. She has been tolerating the dressing changes without complication. Fortunately there is no signs of active infection which is great news and overall very pleased with where things stand today. No fevers, chills, nausea, vomiting, or diarrhea. [name], [name] ([id number]) [id number] Page 7 of 12 05/24/2020 upon evaluation today patient appears to be doing well with regard to her wound. Overall I feel like she is making excellent progress. There does not appear to be any signs of active infection which is great news. 05/31/2020 upon evaluation today patient appears to be doing well with regard to her wound. There does not appear to be any signs of active infection which is great news overall I am extremely pleased with where things stand today. READMISSION 01/23/2022 She returns to clinic today with a new wound on her right posterior calf. She says that she was cleaning out an old shed near the middle of August this year and then noticed what seemed to be a bug bite on her right posterior calf. It was itchy, red, and raised. By the end of September, an ulcer had developed. She has been applying various topical creams such as hydrocortisone and others  to the site. When it was not improving, she made an appointment in the wound care center. ABI in clinic today was 0.97. On her right posterior calf, there is a circular wound with necrotic fat and black eschar present. There is no purulent drainage or malodor. The periwound skin is in good condition with just a little induration that appears to be secondary to inflammation. 01/31/2022: The wound measures a little bit larger today, but overall is quite a bit cleaner. There is some undermining from 9:00 to 3:00. She is having some periwound itching and says that her wrap slid. 02/08/2022: Despite using an Unna boot first layer at the top of her wrap, it slid again and it looks like there is been some bruising at the wound site. The wound is about the same size in terms of  dimensions. There is a fair amount of slough and nonviable tissue still present. Edema control is better than last week. 02/15/2022: The wound dimensions are about the same. There is less nonviable tissue present. The periwound erythema has improved. 02/22/2022: The wound has deteriorated over the past week. It is larger and the periwound is more edematous, erythematous, and indurated. It looks as though there has been more tissue breakdown with undermining present. She is having more pain. There is a foul odor coming from the wound. 02/26/2022: The wound looks quite a bit better today and the odor is gone. I still do not have her culture data back, but she seems to be responding well to the Augmentin. 03/06/2022: Her wound continues to improve. There is still some slough accumulation, but the periwound skin is less inflamed. Her culture returned with a polymicrobial population including Pseudomonas and so levofloxacin was also prescribed. She did not understand why a second antibiotic was being added so she has not yet initiated this. 03/14/2022: The wound is about the same, to perhaps a little bit larger. There is a fair amount of  slough accumulation on the surface, as well as some hypertrophic granulation tissue. She is still taking levofloxacin, but has completed taking Augmentin. She has her Jodie Echevaria compound with her today. 12/14; the patient has a significant circular wound on the posterior right calf. She is using Keystone and silver alginate under 3 layer compression. Her ABIs were within normal limits at 0.97. This may have been traumatic or an insect bite at the start I reviewed these records. 04/04/2022: Since I last saw the wound, it has contracted considerably. There is a fairly thick layer of slough on the surface, as it has not been debrided since the last time I did it. Edema control is excellent and the periwound skin is in much better condition. 04/12/2022: No significant change in the wound dimensions. It is filling with granulation tissue. Still with slough accumulation on the surface. 04/19/2022: The wound is smaller this week. There is some slough accumulation on the surface. 04/26/2022: The wound is smaller again this week and significantly cleaner. The wound surface is a little bit drier than ideal. 05/03/2022: The wound measurements were about the same, but visually it appears smaller. There is still some undermining at the top of the wound. Moisture balance is better this week. 05/10/2022: The wound measured smaller today. There is still a fair amount of undermining present. Slough has built up on the surface. We are still awaiting snap VAC approval. 05/17/2022: The wound is a little bit smaller today. There is less slough on the surface, but the granulation tissue still is not very robust. She has been approved for snap VAC but will have a 20% coinsurance and it is not clear what that amount would end up being for her. 05/27/2022: The surface of the wound has deteriorated and it is gray and fibrotic. No significant odor, but the drainage on her dressing was a little bit purulent. 05/31/2022: The wound looks  quite a bit better today. It is still a little fibrotic but no longer has purulent-looking drainage. The color is better. She spoke with her insurance company and is interested in trying the snap VAC, now that she is aware of the cost to her. 06/07/2022: After 1 week in the snap VAC, there has been substantial improvement to her wound. The undermined portion is closing in. The surface has a healthier color and appearance. There is very minimal slough accumulation.  06/14/2022: The more shallow part of the undermined portion of her wound has closed. There is still some undermining from about 1-2 o'clock. The surface continues to improve. Minimal slough accumulation. 06/28/2022: The wound is shallower this week and the undermining has essentially closed and completely. The surface tissue is improving. 07/05/2022: The depth is almost immeasurable and the surface is nearly flush with the surrounding skin. The undermining has been eliminated. There is light slough on the wound surface. 07/12/2022: There is a little bit of slough on the wound surface. The depth is about the same as last week. 07/19/2022: The wound is essentially flush with the surrounding skin surface. There is slight slough present. No real change in the AP or transverse dimensions. 07/26/2022: The wound measured a little bit smaller today. It is flush with the surrounding skin surface and there is good granulation tissue present. Minimal slough and biofilm accumulation. 08/02/2022: The right posterior leg wound is a little bit smaller. There is a little slough accumulation. She reported a new wound to [place] today. It is on her left posterior calf. It has been present for about 6 weeks. She is not sure of how it occurred. She has been trying to manage it at home with topical Neosporin and Band-Aids. The fat layer is exposed. The surface is fibrotic and covered with slough. 08/09/2022: The right posterior leg wound is smaller again this week. There is good  granulation tissue on the surface with minimal slough accumulation. The left posterior calf wound has built up a thick layer of slough. It is still quite fibrotic underneath the slough, but there is a little bit of a pink color beginning to emerge. Edema control is good. 08/16/2022: Both wounds are smaller this week. There is minimal slough on the right posterior leg wound. There is slough that has reaccumulated on the left posterior calf wound but there is a little bit more granulation tissue emerging. 08/23/2022: Both wounds are about the same size this week, but the quality of the tissue and amount of granulation tissue on the left are both better. [name], [name] ([id number]) [id number] Page 8 of 12 08/30/2022: Both wounds measure smaller today, but there has been some tissue breakdown adjacent to the left leg wound. She says it has been itchy. There is a little bit of slough on the surface of both wounds. 09/06/2022: The right wound measures smaller today. There is minimal slough on the wound surface. The left wound is about the same size. The periwound tissue looks better this week. There is still a layer of fibrinous slough on the left wound. Edema control is good bilaterally. 6/7 the patient has wounds on bilateral calfs. We are using silver alginate on the right Iodoflex on the left under Urgo lite compression. The wounds are measuring smaller. 09/20/2022: The right wound is flush with the surrounding skin. There is good granulation tissue with some biofilm and thin eschar present. On the left, there is some slough accumulation and the patient says it has been a little itchy this week. 09/27/2022: No significant change to the right wound. The left wound measures a little smaller, but still has some depth to it with slough accumulation. 10/04/2022: Nice improvement in both wounds. They are smaller and more superficial. The left wound has better granulation tissue and no  longer has the hard fibrous surface. 10/11/2022: Both wounds continue to contract. There is minimal slough and eschar present. Granulation tissue continues to fill in on the left. Edema control  is good. 10/21/2022: The right leg wound is about half the size as it was last week. There is a little perimeter eschar and some light slough on the surface. The left leg wound is about the same size, but shallower. There is still slough accumulation but the buds of granulation tissue are getting larger. Edema control remains excellent. 10/30/2022: The right leg wound is healed. The left leg wound is smaller with better granulation tissue and only a little bit of slough present. Edema control is excellent. 11/07/2022: The left leg wound continues to contract and the surface continues to improve. Minimal slough with good granulation tissue filling in. 11/13/2022: Most of the left leg wound has epithelialized. There is a small open area remaining that is about a millimeter in each dimension. There is slough accumulation present. 11/20/2022: There is a little bit of slough on the wound surface. It is about the same size as last week. Edema control is good. 11/27/2022: A tiny opening remains with slough on the surface. Edema control remains excellent. 12/05/2022: A small opening remains underneath some eschar and slough. 12/12/2022: No difference in the wound on her left leg. Unfortunately, over the weekend, her right leg wound reopened. It is a little over a centimeter in diameter and very superficial with some slough on the surface. 12/18/2022: Both wounds are larger today. There is increased slough on both surfaces, as well. Although edema control seems adequate, she is on her feet quite a bit more due to returning to school and being on her feet much of the day. 12/25/2022: The left calf wound is stable, but the right calf wound is considerably smaller. Both have a little bit of slough present. Edema control is  adequate. 01/01/2023: Both wounds are smaller today. The right calf wound is nearly closed under some eschar. Edema control is adequate. 01/08/2023: The right calf wound is healed. The wound is smaller and more superficial. Edema control is good. She did receive the second sleeve so now she has bilateral lymphedema pumps and she has been using them. 01/15/2023: The left calf wound is down to just a couple of millimeters and is very superficial. Edema control is excellent. 01/22/2023: The left calf wound remains open, but it is minuscule, superficial, and clean. Edema control remains excellent. She has a bunion on her left first metatarsal that has become red and painful. It seems to have been rubbing on her surgical sandal. She denies a history of gout. There is no open wound in this area. Patient History Information obtained from Patient. Family History Cancer - Mother, Diabetes - Father, Hypertension - Mother, Kidney Disease - Mother, Lung Disease - Father, Thyroid Problems - Paternal Grandparents, No family history of Heart Disease, Seizures, Stroke, Tuberculosis. Social History Never smoker, Marital Status - Married, Alcohol Use - Never, Drug Use - No History, Caffeine Use - Daily - coffee. Medical History Eyes Denies history of Cataracts, Glaucoma, Optic Neuritis Ear/Nose/Mouth/Throat Denies history of Chronic sinus problems/congestion, Middle ear problems Hematologic/Lymphatic Patient has history of Anemia - iron Denies history of Hemophilia, Human Immunodeficiency Virus, Lymphedema, Sickle Cell Disease Respiratory Patient has history of Sleep Apnea - CPAP Denies history of Aspiration, Asthma, Chronic Obstructive Pulmonary Disease (COPD), Pneumothorax, Tuberculosis Cardiovascular Patient has history of Hypertension, Peripheral Venous Disease Denies history of Angina, Arrhythmia, Congestive Heart Failure, Coronary Artery Disease, Deep Vein Thrombosis, Hypotension, Myocardial  Infarction, Peripheral Arterial Disease, Phlebitis, Vasculitis Gastrointestinal Denies history of Cirrhosis , Colitis, Crohns, Hepatitis A, Hepatitis B, Hepatitis C Endocrine  Denies history of Type I Diabetes, Type II Diabetes Genitourinary Denies history of End Stage Renal Disease Immunological Denies history of Lupus Erythematosus, Raynauds, Scleroderma Integumentary (Skin) [name], [name] ([id number]) [id number] Page 9 of 12 Denies history of History of Burn Musculoskeletal Denies history of Gout, Rheumatoid Arthritis, Osteoarthritis, Osteomyelitis Neurologic Denies history of Dementia, Neuropathy, Quadriplegia, Paraplegia, Seizure Disorder Oncologic Denies history of Received Chemotherapy, Received Radiation Psychiatric Denies history of Anorexia/bulimia, Confinement Anxiety Hospitalization/Surgery History - cholecystectomy 1980s. - nephrolithasis 1980s. - plate and rod in right elbow surgery 2010. 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 Vitals Time Taken: 3:10 PM, Height: 61 in, Weight: 277 lbs, BMI: 52.3, Temperature: 98.1 F, Pulse: 94 bpm, Respiratory Rate: 20 breaths/min, Blood Pressure: 129/75 mmHg. Respiratory Normal work of breathing on room air. General Notes: 01/22/2023: The left calf wound remains open, but it is minuscule, superficial, and clean. Edema control remains excellent. She has a bunion on her left first metatarsal that has become red and painful. It seems to have been rubbing on her surgical sandal. She denies a history of gout. There is no open wound in this area. Integumentary (Hair, Skin) Wound #3 status is Open. Original cause of wound was Trauma. The date acquired was: 06/28/2022. The wound has been in treatment 24 weeks. The wound is located on the Left,Posterior Lower Leg. The wound measures  0.3cm length x 0.2cm width x 0.1cm depth; 0.047cm^2 area and 0.005cm^3 volume. There is Fat Layer (Subcutaneous Tissue) exposed. There is no tunneling or undermining noted. There is a none present amount of drainage noted. The wound margin is flat and intact. There is large (67-100%) pink granulation within the wound bed. There is no necrotic tissue within the wound bed. The periwound skin appearance had no abnormalities noted for texture. The periwound skin appearance had no abnormalities noted for moisture. The periwound skin appearance had no abnormalities noted for color. Periwound temperature was noted as No Abnormality. Assessment Active Problems ICD-10 Non-pressure chronic ulcer of left calf with fat layer exposed Lymphedema, not elsewhere classified Venous insufficiency (chronic) (peripheral) Morbid (severe) obesity due to excess calories Essential (primary) hypertension Procedures Wound #3 Pre-procedure diagnosis of Wound #3 is a Venous Leg Ulcer located on the Left,Posterior Lower Leg . There was a Double Layer Compression Therapy Procedure by [name] Deed, RN. Post procedure Diagnosis Wound #3: Same as Pre-Procedure Notes: urgo. Plan Follow-up Appointments: [name], [name] ([id number]) [id number] Page 10 of 12 Return Appointment in 1 week. - Dr. Lady Gary - room 1 Wed 10/23 @ 2:45 pm Anesthetic: (In clinic) Topical Lidocaine 4% applied to wound bed Bathing/ Shower/ Hygiene: May shower with protection but do not get wound dressing(s) wet. Protect dressing(s) with water repellant cover (for example, large plastic bag) or a cast cover and may then take shower. Edema Control - Lymphedema / SCD / Other: Lymphedema Pumps. Use Lymphedema pumps on leg(s) 2-3 times a day for 45-60 minutes. If wearing any wraps or hose, do not remove them. Continue exercising as instructed. Elevate legs to the level of the heart or above for 30 minutes daily and/or when  sitting for 3-4 times a day throughout the day. Avoid standing for long periods of time. Exercise regularly Moisturize legs daily. Compression stocking or Garment 20-30 mm/Hg pressure to: - right leg daily Additional Orders / Instructions: Other: - ibuprofen 600 mg every 6 hrs for 2 days, call clinic Friday  if no improvement in left foot redness and pain WOUND #3: - Lower Leg Wound Laterality: Left, Posterior Cleanser: Soap and Water 1 x Per Week/ Discharge Instructions: May shower and wash wound with dial antibacterial soap and water prior to dressing change. Cleanser: Wound Cleanser 1 x Per Week/ Discharge Instructions: Cleanse the wound with wound cleanser prior to applying a clean dressing using gauze sponges, not tissue or cotton balls. Peri-Wound Care: Triamcinolone 15 (g) 1 x Per Week/ Discharge Instructions: Use triamcinolone 15 (g) as directed Peri-Wound Care: Sween Lotion (Moisturizing lotion) 1 x Per Week/ Discharge Instructions: Apply moisturizing lotion as directed Topical: Gentamicin 1 x Per Week/ Discharge Instructions: As directed by physician Topical: Mupirocin Ointment 1 x Per Week/ Discharge Instructions: Apply Mupirocin (Bactroban) as instructed Prim Dressing: Maxorb Extra Ag+ Alginate Dressing, 2x2 (in/in) 1 x Per Week/ ary Discharge Instructions: Apply to wound bed as instructed Secondary Dressing: Woven Gauze Sponges 2x2 in 1 x Per Week/ Discharge Instructions: Apply over primary dressing as directed. Com pression Wrap: Urgo K2, (equivalent to a 4 layer) two layer compression system, regular 1 x Per Week/ Discharge Instructions: Apply Urgo K2 as directed (alternative to 4 layer compression). 01/22/2023: The left calf wound remains open, but it is minuscule, superficial, and clean. Edema control remains excellent. She has a bunion on her left first metatarsal that has become red and painful. It seems to have been rubbing on her surgical sandal. She denies a history  of gout. There is no open wound in this area. No debridement was necessary for her wound. We will continue to apply the mixture of topical gentamicin and mupirocin with silver alginate and Urgo 4-layer compression equivalent wrap. For her toe, I recommended taking 600 mg of ibuprofen every 6 hours for the next 48 hours. If she is not seeing any improvement by midday on Friday, I have asked her to contact our office and may consider prescribing an antibiotic for presumed cellulitis. Follow-up in 1 week. Electronic Signature(s) Signed: 01/22/2023 3:48:43 PM By: [name] MD FACS Entered By: [name] on 01/22/2023 12:48:43 -------------------------------------------------------------------------------- HxROS Details Patient Name: Date of Service: [name], [name] 01/22/2023 2:45 PM Medical Record Number: [id number] Patient Account Number: [id number] Date of Birth/Sex: Treating RN: [birth year] (62 y.o. F) Primary Care Provider: Nira Conn Other Clinician: Referring Provider: Treating Provider/Extender: Andrey Campanile in Treatment: 52 Information Obtained From Patient Eyes Medical History: Negative for: Cataracts; Glaucoma; Optic Neuritis Ear/Nose/Mouth/Throat Medical History: Negative for: Chronic sinus problems/congestion; Middle ear problems OTTIE, NEGLIA ([id number]) [id number].pdf Page 11 of 12 Hematologic/Lymphatic Medical History: Positive for: Anemia - iron Negative for: Hemophilia; Human Immunodeficiency Virus; Lymphedema; Sickle Cell Disease Respiratory Medical History: Positive for: Sleep Apnea - CPAP Negative for: Aspiration; Asthma; Chronic Obstructive Pulmonary Disease (COPD); Pneumothorax; Tuberculosis Cardiovascular Medical History: Positive for: Hypertension; Peripheral Venous Disease Negative for: Angina; Arrhythmia; Congestive Heart Failure; Coronary Artery Disease; Deep Vein Thrombosis;  Hypotension; Myocardial Infarction; Peripheral Arterial Disease; Phlebitis; Vasculitis Gastrointestinal Medical History: Negative for: Cirrhosis ; Colitis; Crohns; Hepatitis A; Hepatitis B; Hepatitis C Endocrine Medical History: Negative for: Type I Diabetes; Type II Diabetes Genitourinary Medical History: Negative for: End Stage Renal Disease Past Medical History Notes: years ago kidney stones Immunological Medical History: Negative for: Lupus Erythematosus; Raynauds; Scleroderma Integumentary (Skin) Medical History: Negative for: History of Burn Musculoskeletal Medical History: Negative for: Gout; Rheumatoid Arthritis; Osteoarthritis; Osteomyelitis Neurologic Medical History: Negative for: Dementia; Neuropathy; Quadriplegia; Paraplegia; Seizure Disorder Oncologic Medical History: Negative for: Received  Chemotherapy; Received Radiation Past Medical History Notes: skin Ca removed from back years ago Psychiatric Medical History: Negative for: Anorexia/bulimia; Confinement Anxiety Immunizations Pneumococcal Vaccine: Received Pneumococcal Vaccination: No Implantable Devices None Hospitalization / Surgery History Type of Hospitalization/Surgery TAQUANA, BARTLEY ([id number]) [phone].pdf Page 12 of 12 cholecystectomy 1980s nephrolithasis 1980s plate and rod in right elbow surgery 2010 Family and Social History Cancer: Yes - Mother; Diabetes: Yes - Father; Heart Disease: No; Hypertension: Yes - Mother; Kidney Disease: Yes - Mother; Lung Disease: Yes - Father; Seizures: No; Stroke: No; Thyroid Problems: Yes - Paternal Grandparents; Tuberculosis: No; Never smoker; Marital Status - Married; Alcohol Use: Never; Drug Use: No History; Caffeine Use: Daily - coffee; Financial Concerns: No; Food, Clothing or Shelter Needs: No; Support System Lacking: No; Transportation Concerns: No Electronic Signature(s) Signed: 01/23/2023 9:16:50 AM By: [name]  MD FACS Entered By: [name] on 01/22/2023 12:44:51 -------------------------------------------------------------------------------- SuperBill Details Patient Name: Date of Service: [name], [name] 01/22/2023 Medical Record Number: [id number] Patient Account Number: [id number] Date of Birth/Sex: Treating RN: 12-06-1960 (62 y.o. F) Primary Care Provider: Nira Conn Other Clinician: Referring Provider: Treating Provider/Extender: Andrey Campanile in Treatment: 50 Diagnosis Coding ICD-10 Codes Code Description [phone] Non-pressure chronic ulcer of left calf with fat layer exposed I89.0 Lymphedema, not elsewhere classified I87.2 Venous insufficiency (chronic) (peripheral) E66.01 Morbid (severe) obesity due to excess calories I10 Essential (primary) hypertension Facility Procedures : CPT4 Code: [id number] Description: (Facility Use Only) [phone] - APPLY MULTLAY COMPRS LWR LT LEG Modifier: Quantity: 1 Physician Procedures : CPT4 Code Description Modifier [id number] - WC PHYS LEVEL 4 - EST PT ICD-10 Diagnosis Description L97.222 Non-pressure chronic ulcer of left calf with fat layer exposed I89.0 Lymphedema, not elsewhere classified I87.2 Venous insufficiency (chronic)  (peripheral) E66.01 Morbid (severe) obesity due to excess calories Quantity: 1 Electronic Signature(s) Signed: 01/22/2023 5:25:43 PM By: [name] Deed RN, BSN Signed: 01/23/2023 9:16:50 AM By: [name] MD FACS Previous Signature: 01/22/2023 3:48:57 PM Version By: [name] MD FACS Entered By: [name] on 01/22/2023 14:08:43

## 2023-01-25 ENCOUNTER — Other Ambulatory Visit: Payer: Self-pay | Admitting: Family Medicine

## 2023-01-25 DIAGNOSIS — I10 Essential (primary) hypertension: Secondary | ICD-10-CM

## 2023-01-27 ENCOUNTER — Other Ambulatory Visit: Payer: Self-pay | Admitting: Nurse Practitioner

## 2023-01-27 DIAGNOSIS — R0609 Other forms of dyspnea: Secondary | ICD-10-CM

## 2023-01-28 ENCOUNTER — Telehealth: Payer: Self-pay | Admitting: *Deleted

## 2023-01-28 DIAGNOSIS — M810 Age-related osteoporosis without current pathological fracture: Secondary | ICD-10-CM

## 2023-01-28 MED ORDER — ALENDRONATE SODIUM 70 MG PO TABS: 70.0000 mg | ORAL_TABLET | ORAL | 1 refills | Status: DC

## 2023-01-28 NOTE — Telephone Encounter (Signed)
Rx done. 

## 2023-01-29 ENCOUNTER — Encounter (HOSPITAL_BASED_OUTPATIENT_CLINIC_OR_DEPARTMENT_OTHER): Payer: BC Managed Care – PPO | Admitting: General Surgery

## 2023-01-29 DIAGNOSIS — L97222 Non-pressure chronic ulcer of left calf with fat layer exposed: Secondary | ICD-10-CM | POA: Diagnosis not present

## 2023-01-29 NOTE — Progress Notes (Signed)
[name], CASTROGIOVANNI ([id number]) [id number] Page 1 of 11 Visit Report for 01/29/2023 Chief Complaint Document Details Patient Name: Date of Service: [name] 01/29/2023 3:30 PM Medical Record Number: [id number] Patient Account Number: [id number] Date of Birth/Sex: Treating RN: 02-20-1961 (62 y.o. F) Primary Care Provider: Nira Conn Other Clinician: Referring Provider: Treating Provider/Extender: Andrey Campanile in Treatment: 59 Information Obtained from: Patient Chief Complaint RLE ulcer Electronic Signature(s) Signed: 01/29/2023 4:28:45 PM By: [name] MD FACS Entered By: [name] on 01/29/2023 13:28:44 -------------------------------------------------------------------------------- HPI Details Patient Name: Date of Service: [name], [name] 01/29/2023 3:30 PM Medical Record Number: [id number] Patient Account Number: [id number] Date of Birth/Sex: Treating RN: [birth year] (62 y.o. F) Primary Care Provider: Nira Conn Other Clinician: Referring Provider: Treating Provider/Extender: Andrey Campanile in Treatment: 55 History of Present Illness HPI Description: 02/16/2020 upon evaluation today patient actually appears to be doing poorly in regard to her left medial lower extremity ulcer. This is actually an area that she tells me she has had intermittent issues with over the years although has been closed for some time she typically uses compression right now she has juxta lite compression wraps. With that being said she tells me that this nonetheless open several weeks/months ago and has been given her trouble since. She does have a history of chronic venous insufficiency she is seeing specialist for this in the past she has had an ablation as well as sclerotherapy. With that being said she also has hypertension chronically which is managed by her primary care provider. In  general she seems to be worsening overall with regard to the wound and states that she finally realized that she needed to come in and have somebody look at this and not continue to try to manage this on her own. No fevers, chills, nausea, vomiting, or diarrhea. 02/23/2020 on evaluation today patient appears to be doing well with regard to her wound. This is showing some signs of improvement which is great news still were not quite at the point where I would like to be as far as the overall appearance of the wound is concerned but I do believe this is better than last week. I do believe the Iodoflex is helping as well. 03/08/2020 upon evaluation today patient appears to be doing well with regard to her wound. She has been tolerating the dressing changes without complication. Fortunately I feel like she has made great progress with the Iodoflex but I feel like it may be the point rest to switch to something else possibly a collagen- based dressing at this time. 03/15/2020 upon evaluation today patient appears to be doing excellent in regard to her leg ulcer. She has been tolerating the dressing changes without complication. Fortunately there is no signs of active infection. Overall she is measuring a little bit smaller today which is great news. 03/22/2020 upon evaluation today patient appears to be doing well with regard to her wound. She has been tolerating the dressing changes without complication. Fortunately there is no signs of active infection at this time. 03/28/2020; patient I do not usually see however she has a wound on the left anterior lower leg secondary to chronic venous insufficiency we have been using silver collagen under compression. She arrives in clinic with a nonviable surface requiring debridement 04/12/2020 upon evaluation today patient appears to be doing well all things considered with regard to her leg ulcer. She is tolerating the dressing changes without complication there  is  minimal dry skin around the edges of the wound that may be trapping and stopping some of the events of the new skin I am can work on that today. Otherwise the surface of the wound appears to be doing excellent. 04/19/2020 upon evaluation today patient appears to be doing well with regard to her leg ulcer. She has been tolerating dressing changes without complication. Fortunately there is no signs of active infection at this time. No fever chills noted overall very pleased with how things seem to be progressing. [name], [name] ([id number]) [id number] Page 2 of 11 04/26/2020 on evaluation today patient appears to be doing well with regard to her wound currently. Is showing signs of excellent improvement overall is filling in nicely and there does not appear to be any signs of infection. No fevers, chills, nausea, vomiting, or diarrhea. 05/03/2020 upon evaluation today patient appears to be doing well with regard to her wound on the leg. This overall showing signs of good improvement which is great she has some good epithelial growth and overall I think that things are moving in the correct direction. We likewise going to continue with the wound care measures as before since she seems to making such good improvement. 2/3; venous wound on the left medial leg. This is contracting. We are using Prisma and 3 layer compression. She has a stocking and waiting in the eventuality this heals. She is already using it on the right 05/17/2020 upon evaluation today patient appears to be doing well with regard to her leg ulcer. She has been tolerating the dressing changes without complication. Fortunately there is no signs of active infection which is great news and overall very pleased with where things stand today. No fevers, chills, nausea, vomiting, or diarrhea. 05/24/2020 upon evaluation today patient appears to be doing well with regard to her wound. Overall I feel like she is making  excellent progress. There does not appear to be any signs of active infection which is great news. 05/31/2020 upon evaluation today patient appears to be doing well with regard to her wound. There does not appear to be any signs of active infection which is great news overall I am extremely pleased with where things stand today. READMISSION 01/23/2022 She returns to clinic today with a new wound on her right posterior calf. She says that she was cleaning out an old shed near the middle of August this year and then noticed what seemed to be a bug bite on her right posterior calf. It was itchy, red, and raised. By the end of September, an ulcer had developed. She has been applying various topical creams such as hydrocortisone and others to the site. When it was not improving, she made an appointment in the wound care center. ABI in clinic today was 0.97. On her right posterior calf, there is a circular wound with necrotic fat and black eschar present. There is no purulent drainage or malodor. The periwound skin is in good condition with just a little induration that appears to be secondary to inflammation. 01/31/2022: The wound measures a little bit larger today, but overall is quite a bit cleaner. There is some undermining from 9:00 to 3:00. She is having some periwound itching and says that her wrap slid. 02/08/2022: Despite using an Unna boot first layer at the top of her wrap, it slid again and it looks like there is been some bruising at the wound site. The wound is about the same size in terms of  dimensions. There is a fair amount of slough and nonviable tissue still present. Edema control is better than last week. 02/15/2022: The wound dimensions are about the same. There is less nonviable tissue present. The periwound erythema has improved. 02/22/2022: The wound has deteriorated over the past week. It is larger and the periwound is more edematous, erythematous, and indurated. It looks as  though there has been more tissue breakdown with undermining present. She is having more pain. There is a foul odor coming from the wound. 02/26/2022: The wound looks quite a bit better today and the odor is gone. I still do not have her culture data back, but she seems to be responding well to the Augmentin. 03/06/2022: Her wound continues to improve. There is still some slough accumulation, but the periwound skin is less inflamed. Her culture returned with a polymicrobial population including Pseudomonas and so levofloxacin was also prescribed. She did not understand why a second antibiotic was being added so she has not yet initiated this. 03/14/2022: The wound is about the same, to perhaps a little bit larger. There is a fair amount of slough accumulation on the surface, as well as some hypertrophic granulation tissue. She is still taking levofloxacin, but has completed taking Augmentin. She has her Jodie Echevaria compound with her today. 12/14; the patient has a significant circular wound on the posterior right calf. She is using Keystone and silver alginate under 3 layer compression. Her ABIs were within normal limits at 0.97. This may have been traumatic or an insect bite at the start I reviewed these records. 04/04/2022: Since I last saw the wound, it has contracted considerably. There is a fairly thick layer of slough on the surface, as it has not been debrided since the last time I did it. Edema control is excellent and the periwound skin is in much better condition. 04/12/2022: No significant change in the wound dimensions. It is filling with granulation tissue. Still with slough accumulation on the surface. 04/19/2022: The wound is smaller this week. There is some slough accumulation on the surface. 04/26/2022: The wound is smaller again this week and significantly cleaner. The wound surface is a little bit drier than ideal. 05/03/2022: The wound measurements were about the same, but visually it  appears smaller. There is still some undermining at the top of the wound. Moisture balance is better this week. 05/10/2022: The wound measured smaller today. There is still a fair amount of undermining present. Slough has built up on the surface. We are still awaiting snap VAC approval. 05/17/2022: The wound is a little bit smaller today. There is less slough on the surface, but the granulation tissue still is not very robust. She has been approved for snap VAC but will have a 20% coinsurance and it is not clear what that amount would end up being for her. 05/27/2022: The surface of the wound has deteriorated and it is gray and fibrotic. No significant odor, but the drainage on her dressing was a little bit purulent. 05/31/2022: The wound looks quite a bit better today. It is still a little fibrotic but no longer has purulent-looking drainage. The color is better. She spoke with her insurance company and is interested in trying the snap VAC, now that she is aware of the cost to her. 06/07/2022: After 1 week in the snap VAC, there has been substantial improvement to her wound. The undermined portion is closing in. The surface has a healthier color and appearance. There is very minimal slough accumulation.  06/14/2022: The more shallow part of the undermined portion of her wound has closed. There is still some undermining from about 1-2 o'clock. The surface continues to improve. Minimal slough accumulation. 06/28/2022: The wound is shallower this week and the undermining has essentially closed and completely. The surface tissue is improving. 07/05/2022: The depth is almost immeasurable and the surface is nearly flush with the surrounding skin. The undermining has been eliminated. There is light slough on the wound surface. 07/12/2022: There is a little bit of slough on the wound surface. The depth is about the same as last week. 07/19/2022: The wound is essentially flush with the surrounding skin surface. There is  slight slough present. No real change in the AP or transverse dimensions. 07/26/2022: The wound measured a little bit smaller today. It is flush with the surrounding skin surface and there is good granulation tissue present. Minimal slough and biofilm accumulation. CAMILA, BOSCIA ([id number]) [id number] Page 3 of 11 08/02/2022: The right posterior leg wound is a little bit smaller. There is a little slough accumulation. She reported a new wound to [place] today. It is on her left posterior calf. It has been present for about 6 weeks. She is not sure of how it occurred. She has been trying to manage it at home with topical Neosporin and Band-Aids. The fat layer is exposed. The surface is fibrotic and covered with slough. 08/09/2022: The right posterior leg wound is smaller again this week. There is good granulation tissue on the surface with minimal slough accumulation. The left posterior calf wound has built up a thick layer of slough. It is still quite fibrotic underneath the slough, but there is a little bit of a pink color beginning to emerge. Edema control is good. 08/16/2022: Both wounds are smaller this week. There is minimal slough on the right posterior leg wound. There is slough that has reaccumulated on the left posterior calf wound but there is a little bit more granulation tissue emerging. 08/23/2022: Both wounds are about the same size this week, but the quality of the tissue and amount of granulation tissue on the left are both better. 08/30/2022: Both wounds measure smaller today, but there has been some tissue breakdown adjacent to the left leg wound. She says it has been itchy. There is a little bit of slough on the surface of both wounds. 09/06/2022: The right wound measures smaller today. There is minimal slough on the wound surface. The left wound is about the same size. The periwound tissue looks better this week. There is still a layer of fibrinous slough on  the left wound. Edema control is good bilaterally. 6/7 the patient has wounds on bilateral calfs. We are using silver alginate on the right Iodoflex on the left under Urgo lite compression. The wounds are measuring smaller. 09/20/2022: The right wound is flush with the surrounding skin. There is good granulation tissue with some biofilm and thin eschar present. On the left, there is some slough accumulation and the patient says it has been a little itchy this week. 09/27/2022: No significant change to the right wound. The left wound measures a little smaller, but still has some depth to it with slough accumulation. 10/04/2022: Nice improvement in both wounds. They are smaller and more superficial. The left wound has better granulation tissue and no longer has the hard fibrous surface. 10/11/2022: Both wounds continue to contract. There is minimal slough and eschar present. Granulation tissue continues to fill in on the left. Edema control  is good. 10/21/2022: The right leg wound is about half the size as it was last week. There is a little perimeter eschar and some light slough on the surface. The left leg wound is about the same size, but shallower. There is still slough accumulation but the buds of granulation tissue are getting larger. Edema control remains excellent. 10/30/2022: The right leg wound is healed. The left leg wound is smaller with better granulation tissue and only a little bit of slough present. Edema control is excellent. 11/07/2022: The left leg wound continues to contract and the surface continues to improve. Minimal slough with good granulation tissue filling in. 11/13/2022: Most of the left leg wound has epithelialized. There is a small open area remaining that is about a millimeter in each dimension. There is slough accumulation present. 11/20/2022: There is a little bit of slough on the wound surface. It is about the same size as last week. Edema control is good. 11/27/2022: A tiny  opening remains with slough on the surface. Edema control remains excellent. 12/05/2022: A small opening remains underneath some eschar and slough. 12/12/2022: No difference in the wound on her left leg. Unfortunately, over the weekend, her right leg wound reopened. It is a little over a centimeter in diameter and very superficial with some slough on the surface. 12/18/2022: Both wounds are larger today. There is increased slough on both surfaces, as well. Although edema control seems adequate, she is on her feet quite a bit more due to returning to school and being on her feet much of the day. 12/25/2022: The left calf wound is stable, but the right calf wound is considerably smaller. Both have a little bit of slough present. Edema control is adequate. 01/01/2023: Both wounds are smaller today. The right calf wound is nearly closed under some eschar. Edema control is adequate. 01/08/2023: The right calf wound is healed. The wound is smaller and more superficial. Edema control is good. She did receive the second sleeve so now she has bilateral lymphedema pumps and she has been using them. 01/15/2023: The left calf wound is down to just a couple of millimeters and is very superficial. Edema control is excellent. 01/22/2023: The left calf wound remains open, but it is minuscule, superficial, and clean. Edema control remains excellent. She has a bunion on her left first metatarsal that has become red and painful. It seems to have been rubbing on her surgical sandal. She denies a history of gout. There is no open wound in this area. 01/29/2023: Her wound is completely healed. Her left first metatarsal head did not get better with just NSAIDs, so I sent in a prescription for Keflex and this resulted in improvement. Electronic Signature(s) Signed: 01/29/2023 4:31:06 PM By: [name] MD FACS Entered By: [name] on 01/29/2023  13:31:06 -------------------------------------------------------------------------------- Physical Exam Details Patient Name: Date of Service: [name], [name] 01/29/2023 3:30 PM [name] ([id number]) [id number] Page 4 of 11 Medical Record Number: [id number] Patient Account Number: [id number] Date of Birth/Sex: Treating RN: 03-24-1961 (62 y.o. F) Primary Care Provider: Nira Conn Other Clinician: Referring Provider: Treating Provider/Extender: Andrey Campanile in Treatment: 53 Constitutional . . . . no acute distress. Respiratory Normal work of breathing on room air.. Notes 01/29/2023: Her wound is healed. Electronic Signature(s) Signed: 01/29/2023 4:33:55 PM By: [name] MD FACS Entered By: [name] on 01/29/2023 13:33:55 -------------------------------------------------------------------------------- Physician Orders Details Patient Name: Date of Service: [name], [name] 01/29/2023 3:30 PM  Medical Record Number: [id number] Patient Account Number: [id number] Date of Birth/Sex: Treating RN: [birth year] (62 y.o. [name] Primary Care Provider: Nira Conn Other Clinician: Referring Provider: Treating Provider/Extender: Andrey Campanile in Treatment: 50 The following information was scribed by: [name] The information was scribed for: [name] Verbal / Phone Orders: No Diagnosis Coding ICD-10 Coding Code Description [phone] Non-pressure chronic ulcer of left calf with fat layer exposed I89.0 Lymphedema, not elsewhere classified I87.2 Venous insufficiency (chronic) (peripheral) E66.01 Morbid (severe) obesity due to excess calories I10 Essential (primary) hypertension Discharge From [facility] Discharge from Wound Care Center Bathing/ Shower/ Hygiene May shower and wash wound with soap and water. Edema Control - Lymphedema / SCD /  Other Lymphedema Pumps. Use Lymphedema pumps on leg(s) 2-3 times a day for 45-60 minutes. If wearing any wraps or hose, do not remove them. Continue exercising as instructed. Elevate legs to the level of the heart or above for 30 minutes daily and/or when sitting for 3-4 times a day throughout the day. Avoid standing for long periods of time. Exercise regularly Moisturize legs daily. Compression stocking or Garment 20-30 mm/Hg pressure to: - both legs daily Electronic Signature(s) Signed: 01/29/2023 4:34:05 PM By: [name] MD FACS Entered By: [name] on 01/29/2023 13:34:05 [name] ([id number]) [id number] Page 5 of 11 -------------------------------------------------------------------------------- Problem List Details Patient Name: Date of Service: [name] 01/29/2023 3:30 PM Medical Record Number: [id number] Patient Account Number: [id number] Date of Birth/Sex: Treating RN: 11-Jul-1960 (62 y.o. [name] Primary Care Provider: Nira Conn Other Clinician: Referring Provider: Treating Provider/Extender: Andrey Campanile in Treatment: 44 Active Problems ICD-10 Encounter Code Description Active Date MDM Diagnosis L97.222 Non-pressure chronic ulcer of left calf with fat layer exposed 08/02/2022 No Yes I89.0 Lymphedema, not elsewhere classified 01/23/2022 No Yes I87.2 Venous insufficiency (chronic) (peripheral) 01/23/2022 No Yes E66.01 Morbid (severe) obesity due to excess calories 01/23/2022 No Yes I10 Essential (primary) hypertension 01/23/2022 No Yes Inactive Problems ICD-10 Code Description Active Date Inactive Date L97.212 Non-pressure chronic ulcer of right calf with fat layer exposed 12/12/2022 01/23/2022 Resolved Problems Electronic Signature(s) Signed: 01/29/2023 4:23:42 PM By: [name] MD FACS Entered By: [name] on 01/29/2023  13:23:41 -------------------------------------------------------------------------------- Progress Note Details Patient Name: Date of Service: [name], [name] 01/29/2023 3:30 PM Medical Record Number: [id number] Patient Account Number: [id number] Date of Birth/Sex: Treating RN: [birth year] (62 y.o. F) Primary Care Provider: Nira Conn Other Clinician: Referring Provider: Treating Provider/Extender: Andrey Campanile in Treatment: [address] [place] ([id number]) [id number] Page 6 of 11 Chief Complaint Information obtained from Patient RLE ulcer History of Present Illness (HPI) 02/16/2020 upon evaluation today patient actually appears to be doing poorly in regard to her left medial lower extremity ulcer. This is actually an area that she tells me she has had intermittent issues with over the years although has been closed for some time she typically uses compression right now she has juxta lite compression wraps. With that being said she tells me that this nonetheless open several weeks/months ago and has been given her trouble since. She does have a history of chronic venous insufficiency she is seeing specialist for this in the past she has had an ablation as well as sclerotherapy. With that being said she also has hypertension chronically which is managed by her primary care provider. In general she seems to be worsening overall with regard to the wound and states  that she finally realized that she needed to come in and have somebody look at this and not continue to try to manage this on her own. No fevers, chills, nausea, vomiting, or diarrhea. 02/23/2020 on evaluation today patient appears to be doing well with regard to her wound. This is showing some signs of improvement which is great news still were not quite at the point where I would like to be as far as the overall appearance of the wound is concerned but I do  believe this is better than last week. I do believe the Iodoflex is helping as well. 03/08/2020 upon evaluation today patient appears to be doing well with regard to her wound. She has been tolerating the dressing changes without complication. Fortunately I feel like she has made great progress with the Iodoflex but I feel like it may be the point rest to switch to something else possibly a collagen- based dressing at this time. 03/15/2020 upon evaluation today patient appears to be doing excellent in regard to her leg ulcer. She has been tolerating the dressing changes without complication. Fortunately there is no signs of active infection. Overall she is measuring a little bit smaller today which is great news. 03/22/2020 upon evaluation today patient appears to be doing well with regard to her wound. She has been tolerating the dressing changes without complication. Fortunately there is no signs of active infection at this time. 03/28/2020; patient I do not usually see however she has a wound on the left anterior lower leg secondary to chronic venous insufficiency we have been using silver collagen under compression. She arrives in clinic with a nonviable surface requiring debridement 04/12/2020 upon evaluation today patient appears to be doing well all things considered with regard to her leg ulcer. She is tolerating the dressing changes without complication there is minimal dry skin around the edges of the wound that may be trapping and stopping some of the events of the new skin I am can work on that today. Otherwise the surface of the wound appears to be doing excellent. 04/19/2020 upon evaluation today patient appears to be doing well with regard to her leg ulcer. She has been tolerating dressing changes without complication. Fortunately there is no signs of active infection at this time. No fever chills noted overall very pleased with how things seem to be progressing. 04/26/2020 on evaluation  today patient appears to be doing well with regard to her wound currently. Is showing signs of excellent improvement overall is filling in nicely and there does not appear to be any signs of infection. No fevers, chills, nausea, vomiting, or diarrhea. 05/03/2020 upon evaluation today patient appears to be doing well with regard to her wound on the leg. This overall showing signs of good improvement which is great she has some good epithelial growth and overall I think that things are moving in the correct direction. We likewise going to continue with the wound care measures as before since she seems to making such good improvement. 2/3; venous wound on the left medial leg. This is contracting. We are using Prisma and 3 layer compression. She has a stocking and waiting in the eventuality this heals. She is already using it on the right 05/17/2020 upon evaluation today patient appears to be doing well with regard to her leg ulcer. She has been tolerating the dressing changes without complication. Fortunately there is no signs of active infection which is great news and overall very pleased with where things stand today.  No fevers, chills, nausea, vomiting, or diarrhea. 05/24/2020 upon evaluation today patient appears to be doing well with regard to her wound. Overall I feel like she is making excellent progress. There does not appear to be any signs of active infection which is great news. 05/31/2020 upon evaluation today patient appears to be doing well with regard to her wound. There does not appear to be any signs of active infection which is great news overall I am extremely pleased with where things stand today. READMISSION 01/23/2022 She returns to clinic today with a new wound on her right posterior calf. She says that she was cleaning out an old shed near the middle of August this year and then noticed what seemed to be a bug bite on her right posterior calf. It was itchy, red, and raised. By the  end of September, an ulcer had developed. She has been applying various topical creams such as hydrocortisone and others to the site. When it was not improving, she made an appointment in the wound care center. ABI in clinic today was 0.97. On her right posterior calf, there is a circular wound with necrotic fat and black eschar present. There is no purulent drainage or malodor. The periwound skin is in good condition with just a little induration that appears to be secondary to inflammation. 01/31/2022: The wound measures a little bit larger today, but overall is quite a bit cleaner. There is some undermining from 9:00 to 3:00. She is having some periwound itching and says that her wrap slid. 02/08/2022: Despite using an Unna boot first layer at the top of her wrap, it slid again and it looks like there is been some bruising at the wound site. The wound is about the same size in terms of dimensions. There is a fair amount of slough and nonviable tissue still present. Edema control is better than last week. 02/15/2022: The wound dimensions are about the same. There is less nonviable tissue present. The periwound erythema has improved. 02/22/2022: The wound has deteriorated over the past week. It is larger and the periwound is more edematous, erythematous, and indurated. It looks as though there has been more tissue breakdown with undermining present. She is having more pain. There is a foul odor coming from the wound. 02/26/2022: The wound looks quite a bit better today and the odor is gone. I still do not have her culture data back, but she seems to be responding well to the Augmentin. 03/06/2022: Her wound continues to improve. There is still some slough accumulation, but the periwound skin is less inflamed. Her culture returned with a polymicrobial population including Pseudomonas and so levofloxacin was also prescribed. She did not understand why a second antibiotic was being added so she has not  yet initiated this. 03/14/2022: The wound is about the same, to perhaps a little bit larger. There is a fair amount of slough accumulation on the surface, as well as some hypertrophic granulation tissue. She is still taking levofloxacin, but has completed taking Augmentin. She has her Jodie Echevaria compound with her today. 12/14; the patient has a significant circular wound on the posterior right calf. She is using Keystone and silver alginate under 3 layer compression. Her ABIs were within normal limits at 0.97. This may have been traumatic or an insect bite at the start I reviewed these records. 04/04/2022: Since I last saw the wound, it has contracted considerably. There is a fairly thick layer of slough on the surface, as it has not  been debrided since the last time I did it. Edema control is excellent and the periwound skin is in much better condition. ROBIN, BUTTS ([id number]) [id number] Page 7 of 11 04/12/2022: No significant change in the wound dimensions. It is filling with granulation tissue. Still with slough accumulation on the surface. 04/19/2022: The wound is smaller this week. There is some slough accumulation on the surface. 04/26/2022: The wound is smaller again this week and significantly cleaner. The wound surface is a little bit drier than ideal. 05/03/2022: The wound measurements were about the same, but visually it appears smaller. There is still some undermining at the top of the wound. Moisture balance is better this week. 05/10/2022: The wound measured smaller today. There is still a fair amount of undermining present. Slough has built up on the surface. We are still awaiting snap VAC approval. 05/17/2022: The wound is a little bit smaller today. There is less slough on the surface, but the granulation tissue still is not very robust. She has been approved for snap VAC but will have a 20% coinsurance and it is not clear what that amount would end up being  for her. 05/27/2022: The surface of the wound has deteriorated and it is gray and fibrotic. No significant odor, but the drainage on her dressing was a little bit purulent. 05/31/2022: The wound looks quite a bit better today. It is still a little fibrotic but no longer has purulent-looking drainage. The color is better. She spoke with her insurance company and is interested in trying the snap VAC, now that she is aware of the cost to her. 06/07/2022: After 1 week in the snap VAC, there has been substantial improvement to her wound. The undermined portion is closing in. The surface has a healthier color and appearance. There is very minimal slough accumulation. 06/14/2022: The more shallow part of the undermined portion of her wound has closed. There is still some undermining from about 1-2 o'clock. The surface continues to improve. Minimal slough accumulation. 06/28/2022: The wound is shallower this week and the undermining has essentially closed and completely. The surface tissue is improving. 07/05/2022: The depth is almost immeasurable and the surface is nearly flush with the surrounding skin. The undermining has been eliminated. There is light slough on the wound surface. 07/12/2022: There is a little bit of slough on the wound surface. The depth is about the same as last week. 07/19/2022: The wound is essentially flush with the surrounding skin surface. There is slight slough present. No real change in the AP or transverse dimensions. 07/26/2022: The wound measured a little bit smaller today. It is flush with the surrounding skin surface and there is good granulation tissue present. Minimal slough and biofilm accumulation. 08/02/2022: The right posterior leg wound is a little bit smaller. There is a little slough accumulation. She reported a new wound to [place] today. It is on her left posterior calf. It has been present for about 6 weeks. She is not sure of how it occurred. She has been trying to manage it at  home with topical Neosporin and Band-Aids. The fat layer is exposed. The surface is fibrotic and covered with slough. 08/09/2022: The right posterior leg wound is smaller again this week. There is good granulation tissue on the surface with minimal slough accumulation. The left posterior calf wound has built up a thick layer of slough. It is still quite fibrotic underneath the slough, but there is a little bit of a pink color beginning to  emerge. Edema control is good. 08/16/2022: Both wounds are smaller this week. There is minimal slough on the right posterior leg wound. There is slough that has reaccumulated on the left posterior calf wound but there is a little bit more granulation tissue emerging. 08/23/2022: Both wounds are about the same size this week, but the quality of the tissue and amount of granulation tissue on the left are both better. 08/30/2022: Both wounds measure smaller today, but there has been some tissue breakdown adjacent to the left leg wound. She says it has been itchy. There is a little bit of slough on the surface of both wounds. 09/06/2022: The right wound measures smaller today. There is minimal slough on the wound surface. The left wound is about the same size. The periwound tissue looks better this week. There is still a layer of fibrinous slough on the left wound. Edema control is good bilaterally. 6/7 the patient has wounds on bilateral calfs. We are using silver alginate on the right Iodoflex on the left under Urgo lite compression. The wounds are measuring smaller. 09/20/2022: The right wound is flush with the surrounding skin. There is good granulation tissue with some biofilm and thin eschar present. On the left, there is some slough accumulation and the patient says it has been a little itchy this week. 09/27/2022: No significant change to the right wound. The left wound measures a little smaller, but still has some depth to it with slough accumulation. 10/04/2022: Nice  improvement in both wounds. They are smaller and more superficial. The left wound has better granulation tissue and no longer has the hard fibrous surface. 10/11/2022: Both wounds continue to contract. There is minimal slough and eschar present. Granulation tissue continues to fill in on the left. Edema control is good. 10/21/2022: The right leg wound is about half the size as it was last week. There is a little perimeter eschar and some light slough on the surface. The left leg wound is about the same size, but shallower. There is still slough accumulation but the buds of granulation tissue are getting larger. Edema control remains excellent. 10/30/2022: The right leg wound is healed. The left leg wound is smaller with better granulation tissue and only a little bit of slough present. Edema control is excellent. 11/07/2022: The left leg wound continues to contract and the surface continues to improve. Minimal slough with good granulation tissue filling in. 11/13/2022: Most of the left leg wound has epithelialized. There is a small open area remaining that is about a millimeter in each dimension. There is slough accumulation present. 11/20/2022: There is a little bit of slough on the wound surface. It is about the same size as last week. Edema control is good. 11/27/2022: A tiny opening remains with slough on the surface. Edema control remains excellent. 12/05/2022: A small opening remains underneath some eschar and slough. 12/12/2022: No difference in the wound on her left leg. Unfortunately, over the weekend, her right leg wound reopened. It is a little over a centimeter in diameter and very superficial with some slough on the surface. PHOENIX, WILKIE ([id number]) [id number] Page 8 of 11 12/18/2022: Both wounds are larger today. There is increased slough on both surfaces, as well. Although edema control seems adequate, she is on her feet quite a bit more due to returning to  school and being on her feet much of the day. 12/25/2022: The left calf wound is stable, but the right calf wound is considerably smaller. Both have a  little bit of slough present. Edema control is adequate. 01/01/2023: Both wounds are smaller today. The right calf wound is nearly closed under some eschar. Edema control is adequate. 01/08/2023: The right calf wound is healed. The wound is smaller and more superficial. Edema control is good. She did receive the second sleeve so now she has bilateral lymphedema pumps and she has been using them. 01/15/2023: The left calf wound is down to just a couple of millimeters and is very superficial. Edema control is excellent. 01/22/2023: The left calf wound remains open, but it is minuscule, superficial, and clean. Edema control remains excellent. She has a bunion on her left first metatarsal that has become red and painful. It seems to have been rubbing on her surgical sandal. She denies a history of gout. There is no open wound in this area. 01/29/2023: Her wound is completely healed. Her left first metatarsal head did not get better with just NSAIDs, so I sent in a prescription for Keflex and this resulted in improvement. Patient History Information obtained from Patient. Family History Cancer - Mother, Diabetes - Father, Hypertension - Mother, Kidney Disease - Mother, Lung Disease - Father, Thyroid Problems - Paternal Grandparents, No family history of Heart Disease, Seizures, Stroke, Tuberculosis. Social History Never smoker, Marital Status - Married, Alcohol Use - Never, Drug Use - No History, Caffeine Use - Daily - coffee. Medical History Eyes Denies history of Cataracts, Glaucoma, Optic Neuritis Ear/Nose/Mouth/Throat Denies history of Chronic sinus problems/congestion, Middle ear problems Hematologic/Lymphatic Patient has history of Anemia - iron Denies history of Hemophilia, Human Immunodeficiency Virus, Lymphedema, Sickle Cell  Disease Respiratory Patient has history of Sleep Apnea - CPAP Denies history of Aspiration, Asthma, Chronic Obstructive Pulmonary Disease (COPD), Pneumothorax, Tuberculosis Cardiovascular Patient has history of Hypertension, Peripheral Venous Disease Denies history of Angina, Arrhythmia, Congestive Heart Failure, Coronary Artery Disease, Deep Vein Thrombosis, Hypotension, Myocardial Infarction, Peripheral Arterial Disease, Phlebitis, Vasculitis Gastrointestinal Denies history of Cirrhosis , Colitis, Crohns, Hepatitis A, Hepatitis B, Hepatitis C Endocrine Denies history of Type I Diabetes, Type II Diabetes Genitourinary Denies history of End Stage Renal Disease Immunological Denies history of Lupus Erythematosus, Raynauds, Scleroderma Integumentary (Skin) Denies history of History of Burn Musculoskeletal Denies history of Gout, Rheumatoid Arthritis, Osteoarthritis, Osteomyelitis Neurologic Denies history of Dementia, Neuropathy, Quadriplegia, Paraplegia, Seizure Disorder Oncologic Denies history of Received Chemotherapy, Received Radiation Psychiatric Denies history of Anorexia/bulimia, Confinement Anxiety Hospitalization/Surgery History - cholecystectomy 1980s. - nephrolithasis 1980s. - plate and rod in right elbow surgery 2010. 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 Vitals Time Taken: 3:45 AM, Height: 61 in, Weight: 277 lbs, BMI: 52.3, Temperature: 97.8 F, Pulse: 84 bpm, Respiratory Rate: 20 breaths/min, Blood Pressure: 126/86 mmHg. TERRILEE, LAVERDURE ([id number]) [id number] Page 9 of 11 Respiratory Normal work of breathing on room air.. General Notes: 01/29/2023: Her wound is healed. Integumentary (Hair, Skin) Wound #3 status is Healed - Epithelialized. Original cause of wound was Trauma. The date acquired was: 06/28/2022.  The wound has been in treatment 25 weeks. The wound is located on the Left,Posterior Lower Leg. The wound measures 0cm length x 0cm width x 0cm depth; 0cm^2 area and 0cm^3 volume. There is no tunneling or undermining noted. There is a none present amount of drainage noted. There is no granulation within the wound bed. There is no necrotic tissue within the wound bed. The periwound skin appearance had no abnormalities noted for  texture. The periwound skin appearance had no abnormalities noted for moisture. The periwound skin appearance had no abnormalities noted for color. Periwound temperature was noted as No Abnormality. Assessment Active Problems ICD-10 Non-pressure chronic ulcer of left calf with fat layer exposed Lymphedema, not elsewhere classified Venous insufficiency (chronic) (peripheral) Morbid (severe) obesity due to excess calories Essential (primary) hypertension Plan Discharge From [facility] Services: Discharge from Wound Care Center Bathing/ Shower/ Hygiene: May shower and wash wound with soap and water. Edema Control - Lymphedema / SCD / Other: Lymphedema Pumps. Use Lymphedema pumps on leg(s) 2-3 times a day for 45-60 minutes. If wearing any wraps or hose, do not remove them. Continue exercising as instructed. Elevate legs to the level of the heart or above for 30 minutes daily and/or when sitting for 3-4 times a day throughout the day. Avoid standing for long periods of time. Exercise regularly Moisturize legs daily. Compression stocking or Garment 20-30 mm/Hg pressure to: - both legs daily 01/29/2023: Her wound is healed and what seems to have been cellulitis in her foot has resolved. She does have lymphedema pumps as well as compression garments and she was advised to use these daily. We will discharge her from the wound care center. She may follow-up in the future if needed. Electronic Signature(s) Signed: 01/29/2023 4:37:38 PM By: [name] MD FACS Entered By:  [name] on 01/29/2023 13:37:38 -------------------------------------------------------------------------------- HxROS Details Patient Name: Date of Service: [name], [name] 01/29/2023 3:30 PM Medical Record Number: [id number] Patient Account Number: [id number] Date of Birth/Sex: Treating RN: [birth year] (62 y.o. F) Primary Care Provider: Nira Conn Other Clinician: Referring Provider: Treating Provider/Extender: Andrey Campanile in Treatment: 16 Information Obtained From Patient Eyes Medical History: Negative for: Cataracts; Glaucoma; Optic Neuritis ULIANA, LADUE ([id number]) [phone] Page 10 of 11 Ear/Nose/Mouth/Throat Medical History: Negative for: Chronic sinus problems/congestion; Middle ear problems Hematologic/Lymphatic Medical History: Positive for: Anemia - iron Negative for: Hemophilia; Human Immunodeficiency Virus; Lymphedema; Sickle Cell Disease Respiratory Medical History: Positive for: Sleep Apnea - CPAP Negative for: Aspiration; Asthma; Chronic Obstructive Pulmonary Disease (COPD); Pneumothorax; Tuberculosis Cardiovascular Medical History: Positive for: Hypertension; Peripheral Venous Disease Negative for: Angina; Arrhythmia; Congestive Heart Failure; Coronary Artery Disease; Deep Vein Thrombosis; Hypotension; Myocardial Infarction; Peripheral Arterial Disease; Phlebitis; Vasculitis Gastrointestinal Medical History: Negative for: Cirrhosis ; Colitis; Crohns; Hepatitis A; Hepatitis B; Hepatitis C Endocrine Medical History: Negative for: Type I Diabetes; Type II Diabetes Genitourinary Medical History: Negative for: End Stage Renal Disease Past Medical History Notes: years ago kidney stones Immunological Medical History: Negative for: Lupus Erythematosus; Raynauds; Scleroderma Integumentary (Skin) Medical History: Negative for: History of Burn Musculoskeletal Medical  History: Negative for: Gout; Rheumatoid Arthritis; Osteoarthritis; Osteomyelitis Neurologic Medical History: Negative for: Dementia; Neuropathy; Quadriplegia; Paraplegia; Seizure Disorder Oncologic Medical History: Negative for: Received Chemotherapy; Received Radiation Past Medical History Notes: skin Ca removed from back years ago Psychiatric Medical History: Negative for: Anorexia/bulimia; Confinement Anxiety Immunizations Pneumococcal Vaccine: Received Pneumococcal Vaccination: No Implantable Devices BONETTA, STIGERS ([id number]) [id number] Page 11 of 11 None Hospitalization / Surgery History Type of Hospitalization/Surgery cholecystectomy 1980s nephrolithasis 1980s plate and rod in right elbow surgery 2010 Family and Social History Cancer: Yes - Mother; Diabetes: Yes - Father; Heart Disease: No; Hypertension: Yes - Mother; Kidney Disease: Yes - Mother; Lung Disease: Yes - Father; Seizures: No; Stroke: No; Thyroid Problems: Yes - Paternal Grandparents; Tuberculosis: No; Never smoker; Marital Status - Married; Alcohol Use: Never; Drug Use: No History; Caffeine Use: Daily - coffee; Financial  Concerns: No; Food, Clothing or Shelter Needs: No; Support System Lacking: No; Transportation Concerns: No Electronic Signature(s) Signed: 01/29/2023 4:38:34 PM By: [name] MD FACS Entered By: [name] on 01/29/2023 13:33:06 -------------------------------------------------------------------------------- SuperBill Details Patient Name: Date of Service: [address], [name] 01/29/2023 Medical Record Number: [id number] Patient Account Number: [id number] Date of Birth/Sex: Treating RN: 1960/04/17 (63 y.o. [name], [name] Primary Care Provider: Nira Conn Other Clinician: Referring Provider: Treating Provider/Extender: Andrey Campanile in Treatment: 53 Diagnosis Coding ICD-10 Codes Code Description [phone]  Non-pressure chronic ulcer of left calf with fat layer exposed I89.0 Lymphedema, not elsewhere classified I87.2 Venous insufficiency (chronic) (peripheral) E66.01 Morbid (severe) obesity due to excess calories I10 Essential (primary) hypertension Facility Procedures : CPT4 Code: [id number] Description: 99213 - WOUND CARE VISIT-LEV 3 EST PT Modifier: Quantity: 1 Physician Procedures : CPT4 Code Description Modifier [id number] 99213 - WC PHYS LEVEL 3 - EST PT ICD-10 Diagnosis Description L97.222 Non-pressure chronic ulcer of left calf with fat layer exposed I89.0 Lymphedema, not elsewhere classified I87.2 Venous insufficiency (chronic)  (peripheral) E66.01 Morbid (severe) obesity due to excess calories Quantity: 1 Electronic Signature(s) Signed: 01/29/2023 4:38:12 PM By: [name] MD FACS Entered By: [name] on 01/29/2023 13:38:12

## 2023-01-30 NOTE — Progress Notes (Signed)
[name], [name] ([id number]) [id number] Page 1 of 8 Visit Report for 01/29/2023 Arrival Information Details Patient Name: Date of Service: [name], [name] 01/29/2023 3:30 PM Medical Record Number: [id number] Patient Account Number: [id number] Date of Birth/Sex: Treating RN: 11/17/1960 (62 y.o. F) Primary Care Emi Lymon: Nira Conn Other Clinician: Referring Sheritha Louis: Treating Mikell Kazlauskas/Extender: Andrey Campanile in Treatment: 62 Visit Information History Since Last Visit Added or deleted any medications: No Patient Arrived: Ambulatory Any new allergies or adverse reactions: No Arrival Time: 15:45 Had a fall or experienced change in No Accompanied By: self activities of daily living that may affect Transfer Assistance: None risk of falls: Patient Identification Verified: Yes Signs or symptoms of abuse/neglect since last visito No Secondary Verification Process Completed: Yes Hospitalized since last visit: No Patient Requires Transmission-Based Precautions: No Implantable device outside of the clinic excluding No Patient Has Alerts: No cellular tissue based products placed in the center since last visit: Has Dressing in Place as Prescribed: Yes Has Compression in Place as Prescribed: Yes Pain Present Now: No Electronic Signature(s) Signed: 01/29/2023 5:45:00 PM By: [name] RN, BSN Previous Signature: 01/29/2023 3:56:46 PM Version By: Dayton Scrape Entered By: [name] on 01/29/2023 13:05:02 -------------------------------------------------------------------------------- Clinic Level of Care Assessment Details Patient Name: Date of Service: [name] 01/29/2023 3:30 PM Medical Record Number: [id number] Patient Account Number: [id number] Date of Birth/Sex: Treating RN: [birth year] (62 y.o. [name] Primary Care Teagen Bucio: Nira Conn Other Clinician: Referring Ligaya Cormier: Treating  Bravery Ketcham/Extender: Andrey Campanile in Treatment: 53 Clinic Level of Care Assessment Items TOOL 4 Quantity Score []  - 0 Use when only an EandM is performed on FOLLOW-UP visit ASSESSMENTS - Nursing Assessment / Reassessment X- 1 10 Reassessment of Co-morbidities (includes updates in patient status) X- 1 5 Reassessment of Adherence to Treatment Plan ASSESSMENTS - Wound and Skin A ssessment / Reassessment X - Simple Wound Assessment / Reassessment - one wound 1 5 []  - 0 Complex Wound Assessment / Reassessment - multiple wounds []  - 0 Dermatologic / Skin Assessment (not related to wound area) ASSESSMENTS - Focused Assessment X- 1 5 Circumferential Edema Measurements - multi extremities []  - 0 Nutritional Assessment / Counseling / Intervention [name] ([id number]) [phone].pdf Page 2 of 8 X- 1 5 Lower Extremity Assessment (monofilament, tuning fork, pulses) []  - 0 Peripheral Arterial Disease Assessment (using hand held doppler) ASSESSMENTS - Ostomy and/or Continence Assessment and Care []  - 0 Incontinence Assessment and Management []  - 0 Ostomy Care Assessment and Management (repouching, etc.) PROCESS - Coordination of Care X - Simple Patient / Family Education for ongoing care 1 15 []  - 0 Complex (extensive) Patient / Family Education for ongoing care X- 1 10 Staff obtains Chiropractor, Records, T Results / Process Orders est []  - 0 Staff telephones HHA, Nursing Homes / Clarify orders / etc []  - 0 Routine Transfer to another Facility (non-emergent condition) []  - 0 Routine Hospital Admission (non-emergent condition) []  - 0 New Admissions / Manufacturing engineer / Ordering NPWT Apligraf, etc. , []  - 0 Emergency Hospital Admission (emergent condition) X- 1 10 Simple Discharge Coordination []  - 0 Complex (extensive) Discharge Coordination PROCESS - Special Needs []  - 0 Pediatric / Minor Patient Management []  -  0 Isolation Patient Management []  - 0 Hearing / Language / Visual special needs []  - 0 Assessment of Community assistance (transportation, D/C planning, etc.) []  - 0 Additional assistance / Altered mentation []  - 0 Support Surface(s)  Assessment (bed, cushion, seat, etc.) INTERVENTIONS - Wound Cleansing / Measurement X - Simple Wound Cleansing - one wound 1 5 []  - 0 Complex Wound Cleansing - multiple wounds X- 1 5 Wound Imaging (photographs - any number of wounds) []  - 0 Wound Tracing (instead of photographs) []  - 0 Simple Wound Measurement - one wound []  - 0 Complex Wound Measurement - multiple wounds INTERVENTIONS - Wound Dressings []  - 0 Small Wound Dressing one or multiple wounds []  - 0 Medium Wound Dressing one or multiple wounds []  - 0 Large Wound Dressing one or multiple wounds []  - 0 Application of Medications - topical []  - 0 Application of Medications - injection INTERVENTIONS - Miscellaneous []  - 0 External ear exam []  - 0 Specimen Collection (cultures, biopsies, blood, body fluids, etc.) []  - 0 Specimen(s) / Culture(s) sent or taken to Lab for analysis []  - 0 Patient Transfer (multiple staff / Nurse, adult / Similar devices) []  - 0 Simple Staple / Suture removal (25 or less) []  - 0 Complex Staple / Suture removal (26 or more) []  - 0 Hypo / Hyperglycemic Management (close monitor of Blood Glucose) [name], [name] ([id number]) [phone].pdf Page 3 of 8 []  - 0 Ankle / Brachial Index (ABI) - do not check if billed separately X- 1 5 Vital Signs Has the patient been seen at the hospital within the last three years: Yes Total Score: 80 Level Of Care: New/Established - Level 3 Electronic Signature(s) Signed: 01/29/2023 5:45:00 PM By: [name] RN, BSN Entered By: [name] on 01/29/2023 13:21:38 -------------------------------------------------------------------------------- Encounter Discharge Information  Details Patient Name: Date of Service: [name], [name] [name] H. 01/29/2023 3:30 PM Medical Record Number: [id number] Patient Account Number: [id number] Date of Birth/Sex: Treating RN: 05-Oct-1960 (62 y.o. [name] Primary Care Damyan Corne: Nira Conn Other Clinician: Referring Laine Fonner: Treating Ardelia Wrede/Extender: Andrey Campanile in Treatment: 53 Encounter Discharge Information Items Discharge Condition: Stable Ambulatory Status: Ambulatory Discharge Destination: Home Transportation: Private Auto Accompanied By: self Schedule Follow-up Appointment: Yes Clinical Summary of Care: Patient Declined Electronic Signature(s) Signed: 01/29/2023 5:45:00 PM By: [name] RN, BSN Entered By: [name] on 01/29/2023 13:16:32 -------------------------------------------------------------------------------- Lower Extremity Assessment Details Patient Name: Date of Service: [place], [name] [name] H. 01/29/2023 3:30 PM Medical Record Number: [id number] Patient Account Number: [id number] Date of Birth/Sex: Treating RN: Aug 28, 1960 (62 y.o. [name] Primary Care Moraima Burd: Nira Conn Other Clinician: Referring Ozzie Knobel: Treating Jackalynn Art/Extender: Andrey Campanile in Treatment: 53 Edema Assessment Assessed: [Left: No] [Right: No] Edema: [Left: Ye] [Right: s] Calf Left: Right: Point of Measurement: From Medial Instep 39.5 cm Ankle Left: Right: Point of Measurement: From Medial Instep 22.5 cm Vascular Assessment Left: [[id number] Page 4 of 8Right:] Pulses: Dorsalis Pedis Palpable: [[id number] Page 4 of 8Yes] Extremity colors, hair growth, and conditions: Extremity Color: [phone].pdf Page 4 of 8Hyperpigmented] Hair Growth on Extremity: [phone].pdf Page 4 of 8No] Temperature of Extremity:  [phone].pdf Page 4 of 8Warm < 3 seconds] Electronic Signature(s) Signed: 01/29/2023 5:45:00 PM By: [name] RN, BSN Entered By: [name] on 01/29/2023 13:06:46 -------------------------------------------------------------------------------- Multi Wound Chart Details Patient Name: Date of Service: [place], [name] [name] H. 01/29/2023 3:30 PM Medical Record Number: [id number] Patient Account Number: [id number] Date of Birth/Sex: Treating RN: 1960-05-18 (62 y.o. F) Primary Care Rashel Okeefe: Nira Conn Other Clinician: Referring Jamarquis Crull: Treating Taivon Haroon/Extender: Andrey Campanile in Treatment: 79 Vital Signs Height(in): 61 Pulse(bpm): 84 Weight(lbs): 277 Blood Pressure(mmHg): 126/86 Body Mass Index(BMI): 52.3  Temperature(F): 97.8 Respiratory Rate(breaths/min): 20 [3:Photos:] [N/A:N/A] Left, Posterior Lower Leg N/A N/A Wound Location: Trauma N/A N/A Wounding Event: Venous Leg Ulcer N/A N/A Primary Etiology: Anemia, Sleep Apnea, Hypertension, N/A N/A Comorbid History: Peripheral Venous Disease 06/28/2022 N/A N/A Date Acquired: 25 N/A N/A Weeks of Treatment: Healed - Epithelialized N/A N/A Wound Status: No N/A N/A Wound Recurrence: 0x0x0 N/A N/A Measurements L x W x D (cm) 0 N/A N/A A (cm) : rea 0 N/A N/A Volume (cm) : 100.00% N/A N/A % Reduction in Area: 100.00% N/A N/A % Reduction in Volume: Full Thickness Without Exposed N/A N/A Classification: Support Structures None Present N/A N/A Exudate Amount: None Present (0%) N/A N/A Granulation Amount: None Present (0%) N/A N/A Necrotic Amount: Fascia: No N/A N/A Exposed Structures: Fat Layer (Subcutaneous Tissue): No Tendon: No Muscle: No Joint: No Bone: No Large (67-100%) N/A N/A Epithelialization: Rash: Yes N/A N/A Periwound Skin Texture: No Abnormalities Noted N/A N/A Periwound Skin MoistureSHAMANE, [name] ([id number])  [id number] Page 5 of 8 Erythema: No N/A N/A Periwound Skin Color: No Abnormality N/A N/A Temperature: Treatment Notes Electronic Signature(s) Signed: 01/29/2023 4:28:35 PM By: [name] MD FACS Entered By: [name] on 01/29/2023 13:28:35 -------------------------------------------------------------------------------- Multi-Disciplinary Care Plan Details Patient Name: Date of Service: [place], [name] [name] H. 01/29/2023 3:30 PM Medical Record Number: [id number] Patient Account Number: [id number] Date of Birth/Sex: Treating RN: 1961/01/15 (62 y.o. [name] Primary Care Imanii Gosdin: Nira Conn Other Clinician: Referring Omran Keelin: Treating Kaylon Hitz/Extender: Andrey Campanile in Treatment: 43 Multidisciplinary Care Plan reviewed with physician Active Inactive Electronic Signature(s) Signed: 01/29/2023 5:45:00 PM By: [name] RN, BSN Entered By: [name] on 01/29/2023 13:08:36 -------------------------------------------------------------------------------- Pain Assessment Details Patient Name: Date of Service: [place], [name] [name] H. 01/29/2023 3:30 PM Medical Record Number: [id number] Patient Account Number: [id number] Date of Birth/Sex: Treating RN: [birth year] (62 y.o. F) Primary Care Tremain Rucinski: Nira Conn Other Clinician: Referring Damico Partin: Treating Tonee Silverstein/Extender: Andrey Campanile in Treatment: 46 Active Problems Location of Pain Severity and Description of Pain Patient Has Paino No Site Locations Rate the pain. [name], [name] ([id number]) [id number] Page 6 of 8 Rate the pain. Current Pain Level: 0 Pain Management and Medication Current Pain Management: Electronic Signature(s) Signed: 01/29/2023 5:45:00 PM By: [name] RN, BSN Previous Signature: 01/29/2023 3:56:46 PM Version By: [place] Scrape Entered By: [name] on  01/29/2023 13:05:57 -------------------------------------------------------------------------------- Patient/Caregiver Education Details Patient Name: Date of Service: [name], Vivi Ferns 10/23/2024andnbsp3:30 PM Medical Record Number: [id number] Patient Account Number: [id number] Date of Birth/Gender: Treating RN: 1960/07/20 (62 y.o. [name] Primary Care Physician: Nira Conn Other Clinician: Referring Physician: Treating Physician/Extender: Andrey Campanile in Treatment: 47 Education Assessment Education Provided To: Patient Education Topics Provided Venous: Methods: Explain/Verbal Responses: Reinforcements needed, State content correctly Wound/Skin Impairment: Methods: Explain/Verbal Responses: Reinforcements needed, State content correctly Electronic Signature(s) Signed: 01/29/2023 5:45:00 PM By: [name] RN, BSN Entered By: [name] on 01/29/2023 13:08:57 Wound Assessment Details -------------------------------------------------------------------------------- [name] ([id number]) [id number] Page 7 of 8 Patient Name: Date of Service: [name] 01/29/2023 3:30 PM Medical Record Number: [id number] Patient Account Number: [id number] Date of Birth/Sex: Treating RN: 1960-05-25 (62 y.o. [name] Primary Care Keyonta Barradas: Nira Conn Other Clinician: Referring Sila Sarsfield: Treating Ysabel Cowgill/Extender: Andrey Campanile in Treatment: 53 Wound Status Wound Number: 3 Primary Venous Leg Ulcer Etiology: Wound Location: Left, Posterior Lower Leg Wound Status: Healed - Epithelialized Wounding Event:  Trauma Comorbid Anemia, Sleep Apnea, Hypertension, Peripheral Venous Date Acquired: 06/28/2022 History: Disease Weeks Of Treatment: 25 Clustered Wound: No Photos Wound Measurements Length: (cm) 0 % Reduction in Area: 100% Width: (cm) 0 % Reduction in  Volume: 100% Depth: (cm) 0 Epithelialization: Large (67-100%) Area: (cm) 0 Tunneling: No Volume: (cm) 0 Undermining: No Wound Description Classification: Full Thickness Without Exposed Support Structures Foul Odor After Cleansing: No Exudate Amount: None Present Slough/Fibrino No Wound Bed Granulation Amount: None Present (0%) Exposed Structure Necrotic Amount: None Present (0%) Fascia Exposed: No Fat Layer (Subcutaneous Tissue) Exposed: No Tendon Exposed: No Muscle Exposed: No Joint Exposed: No Bone Exposed: No Periwound Skin Texture Texture Color No Abnormalities Noted: Yes No Abnormalities Noted: Yes Moisture Temperature / Pain No Abnormalities Noted: Yes Temperature: No Abnormality Electronic Signature(s) Signed: 01/29/2023 5:45:00 PM By: [name] RN, BSN Previous Signature: 01/29/2023 3:56:46 PM Version By: [place] Scrape Entered By: [name] on 01/29/2023 13:07:45 -------------------------------------------------------------------------------- Vitals Details Patient Name: Date of Service: [name], [name] [name] H. 01/29/2023 3:30 PM Medical Record Number: [id number] Patient Account Number: [id number] [name], [name] ([id number]) [id number] Page 8 of 8 Date of Birth/Sex: Treating RN: 12-30-1960 (62 y.o. F) Primary Care Finnigan Warriner: Other Clinician: Nira Conn Referring Kierre Hintz: Treating Zaion Hreha/Extender: Andrey Campanile in Treatment: 53 Vital Signs Time Taken: 03:45 Temperature (F): 97.8 Height (in): 61 Pulse (bpm): 84 Weight (lbs): 277 Respiratory Rate (breaths/min): 20 Body Mass Index (BMI): 52.3 Blood Pressure (mmHg): 126/86 Reference Range: 80 - 120 mg / dl Electronic Signature(s) Signed: 01/29/2023 5:45:00 PM By: [name] RN, BSN Previous Signature: 01/29/2023 3:56:46 PM Version By: [place] Scrape Entered By: [name] on 01/29/2023 13:05:47

## 2023-02-03 ENCOUNTER — Telehealth: Payer: Self-pay | Admitting: *Deleted

## 2023-02-03 DIAGNOSIS — I10 Essential (primary) hypertension: Secondary | ICD-10-CM

## 2023-02-03 MED ORDER — LOSARTAN POTASSIUM 100 MG PO TABS: ORAL_TABLET | ORAL | 0 refills | Status: DC

## 2023-02-03 NOTE — Telephone Encounter (Signed)
Rx done. 

## 2023-02-05 ENCOUNTER — Other Ambulatory Visit: Payer: Self-pay | Admitting: Pulmonary Disease

## 2023-02-06 ENCOUNTER — Ambulatory Visit (HOSPITAL_BASED_OUTPATIENT_CLINIC_OR_DEPARTMENT_OTHER): Payer: BC Managed Care – PPO | Admitting: General Surgery

## 2023-02-13 ENCOUNTER — Other Ambulatory Visit: Payer: Self-pay | Admitting: Pulmonary Disease

## 2023-02-20 ENCOUNTER — Telehealth: Payer: Self-pay | Admitting: Nurse Practitioner

## 2023-02-20 NOTE — Telephone Encounter (Signed)
Patient states needs refill for Nystatin. Pharmacy is [facility]. Patient phone number is [phone] and [phone].

## 2023-02-27 NOTE — Telephone Encounter (Signed)
ATC - mailbox full

## 2023-03-03 ENCOUNTER — Telehealth: Payer: Self-pay | Admitting: Nurse Practitioner

## 2023-03-03 DIAGNOSIS — R0609 Other forms of dyspnea: Secondary | ICD-10-CM

## 2023-03-03 DIAGNOSIS — B370 Candidal stomatitis: Secondary | ICD-10-CM

## 2023-03-03 NOTE — Telephone Encounter (Signed)
Called the pt and there was no answer- LMTCB and closing encounter per protocol.

## 2023-03-03 NOTE — Telephone Encounter (Signed)
Patient states needs refill for Nystatin. Pharmacy is [facility]. Patient phone number is [phone] and [phone].

## 2023-03-05 ENCOUNTER — Encounter: Payer: Self-pay | Admitting: Family Medicine

## 2023-03-05 ENCOUNTER — Other Ambulatory Visit (HOSPITAL_COMMUNITY)
Admission: RE | Admit: 2023-03-05 | Discharge: 2023-03-05 | Disposition: A | Discharge status: Home / Self Care | Payer: BC Managed Care – PPO | Source: Ambulatory Visit | Attending: Family Medicine | Admitting: Family Medicine

## 2023-03-05 ENCOUNTER — Ambulatory Visit: Payer: BC Managed Care – PPO | Admitting: Family Medicine

## 2023-03-05 VITALS — BP 138/88 | HR 80 | Temp 98.0°F | Ht 60.25 in | Wt 279.2 lb

## 2023-03-05 DIAGNOSIS — Z0000 Encounter for general adult medical examination without abnormal findings: Secondary | ICD-10-CM | POA: Diagnosis not present

## 2023-03-05 DIAGNOSIS — Z124 Encounter for screening for malignant neoplasm of cervix: Secondary | ICD-10-CM

## 2023-03-05 DIAGNOSIS — E039 Hypothyroidism, unspecified: Secondary | ICD-10-CM

## 2023-03-05 DIAGNOSIS — Z23 Encounter for immunization: Secondary | ICD-10-CM | POA: Diagnosis not present

## 2023-03-05 DIAGNOSIS — I10 Essential (primary) hypertension: Secondary | ICD-10-CM | POA: Diagnosis not present

## 2023-03-05 DIAGNOSIS — M545 Low back pain, unspecified: Secondary | ICD-10-CM

## 2023-03-05 DIAGNOSIS — R600 Localized edema: Secondary | ICD-10-CM | POA: Diagnosis not present

## 2023-03-05 DIAGNOSIS — Z13220 Encounter for screening for lipoid disorders: Secondary | ICD-10-CM | POA: Diagnosis not present

## 2023-03-05 DIAGNOSIS — Z131 Encounter for screening for diabetes mellitus: Secondary | ICD-10-CM | POA: Diagnosis not present

## 2023-03-05 LAB — COMPREHENSIVE METABOLIC PANEL
ALT: 22 U/L (ref 0–35)
AST: 30 U/L (ref 0–37)
Albumin: 4.4 g/dL (ref 3.5–5.2)
Alkaline Phosphatase: 74 U/L (ref 39–117)
BUN: 23 mg/dL (ref 6–23)
CO2: 29 meq/L (ref 19–32)
Calcium: 9.9 mg/dL (ref 8.4–10.5)
Chloride: 101 meq/L (ref 96–112)
Creatinine, Ser: 1.03 mg/dL (ref 0.40–1.20)
GFR: 57.6 mL/min — ABNORMAL LOW (ref >60.00)
Glucose, Bld: 93 mg/dL (ref 70–99)
Potassium: 3.9 meq/L (ref 3.5–5.1)
Sodium: 139 meq/L (ref 135–145)
Total Bilirubin: 0.4 mg/dL (ref 0.2–1.2)
Total Protein: 8 g/dL (ref 6.0–8.3)

## 2023-03-05 LAB — LIPID PANEL
Cholesterol: 162 mg/dL (ref 0–200)
HDL: 39.8 mg/dL (ref >39.00)
LDL Cholesterol: 102 mg/dL — ABNORMAL HIGH (ref 0–99)
NonHDL: 121.75
Total CHOL/HDL Ratio: 4
Triglycerides: 99 mg/dL (ref 0.0–149.0)
VLDL: 19.8 mg/dL (ref 0.0–40.0)

## 2023-03-05 LAB — TSH: TSH: 1.97 u[IU]/mL (ref 0.35–5.50)

## 2023-03-05 LAB — HEMOGLOBIN A1C: Hgb A1c MFr Bld: 5.7 % (ref 4.6–6.5)

## 2023-03-05 MED ORDER — FLUCONAZOLE 100 MG PO TABS: ORAL_TABLET | ORAL | 0 refills | Status: DC

## 2023-03-05 MED ORDER — LOSARTAN POTASSIUM 100 MG PO TABS: ORAL_TABLET | ORAL | 1 refills | Status: DC

## 2023-03-05 MED ORDER — LIDOCAINE 5 % EX OINT: 1.0000 | TOPICAL_OINTMENT | CUTANEOUS | 2 refills | Status: DC | PRN

## 2023-03-05 MED ORDER — HYDROCHLOROTHIAZIDE 50 MG PO TABS: 50.0000 mg | ORAL_TABLET | Freq: Every day | ORAL | 1 refills | Status: DC

## 2023-03-05 MED ORDER — FUROSEMIDE 20 MG PO TABS: 20.0000 mg | ORAL_TABLET | Freq: Every day | ORAL | 1 refills | Status: DC

## 2023-03-05 MED ORDER — LEVOTHYROXINE SODIUM 175 MCG PO TABS: ORAL_TABLET | ORAL | 1 refills | Status: DC

## 2023-03-05 MED ORDER — BUDESONIDE-FORMOTEROL FUMARATE 80-4.5 MCG/ACT IN AERO: 2.0000 | INHALATION_SPRAY | Freq: Two times a day (BID) | RESPIRATORY_TRACT | 12 refills | Status: DC

## 2023-03-05 MED ORDER — SPACER/AERO-HOLDING CHAMBERS DEVI: 1 refills | Status: AC

## 2023-03-05 NOTE — Telephone Encounter (Signed)
Pt returning missed call. 

## 2023-03-05 NOTE — Progress Notes (Signed)
Complete physical exam  Patient: [name]   DOB: 08-Sep-1960   62 y.o. Female  MRN: [id number]  Subjective:    Chief Complaint  Patient presents with   Annual Exam    [name] is a 62 y.o. female who presents today for a complete physical exam. She reports consuming a general diet. Home exercise routine includes walking .5 hrs per day. She generally feels well. She reports sleeping well. She does not have additional problems to discuss today.    Most recent fall risk assessment:     No data to display           Most recent depression screenings:    03/05/2023    8:02 AM 03/28/2021    9:17 AM  PHQ 2/9 Scores  PHQ - 2 Score 0 0  PHQ- 9 Score 1 1    Vision:has been awhile, will be going this year and Dental: No current dental problems and Receives regular dental care  Patient Active Problem List   Diagnosis Date Noted   Osteoporosis 02/20/2022   Dyspnea on exertion 01/12/2019   Hypertension 11/27/2018   Hypothyroid 11/27/2018   OSA on CPAP 11/27/2018      Patient Care Team: Karie Georges, MD as PCP - General (Family Medicine)   Outpatient Medications Prior to Visit  Medication Sig   acetaminophen (TYLENOL) 500 MG tablet Take 500 mg by mouth as needed.   alendronate (FOSAMAX) 70 MG tablet Take 1 tablet (70 mg total) by mouth every 7 (seven) days. Take with a full glass of water on an empty stomach.   BREO ELLIPTA 100-25 MCG/ACT AEPB Inhale 1 puff by mouth once daily   Calcium Carb-Cholecalciferol (CALCIUM 600 + D) 600-200 MG-UNIT TABS Take 1 tablet by mouth daily.   diphenhydrAMINE (BENADRYL) 25 MG tablet Take 25 mg by mouth as needed.   diphenhydramine-acetaminophen (TYLENOL PM) 25-500 MG TABS tablet Take 1 tablet by mouth at bedtime as needed.   ELDERBERRY PO Take by mouth.   famotidine (PEPCID) 20 MG tablet Take 20 mg by mouth daily as needed for heartburn or indigestion.   Ferrous Sulfate Dried (FERROUS SULFATE IRON) 200 (65 Fe) MG TABS  Take 1 tablet by mouth daily.   mirabegron ER (MYRBETRIQ) 25 MG TB24 tablet Take 1 tablet (25 mg total) by mouth daily.   Multiple Vitamins-Minerals (ONE-A-DAY MENOPAUSE FORMULA) TABS Take 1 tablet by mouth daily.   naproxen sodium (ALEVE) 220 MG tablet Take 1 tablet (220 mg total) by mouth 2 (two) times daily as needed. (Patient taking differently: Take 440 mg by mouth daily as needed.)   nystatin (MYCOSTATIN) 100000 UNIT/ML suspension TAKE 5 ML BY MOUTH  4 TIMES DAILY FOR 10 DAYS   OVER THE COUNTER MEDICATION OTC antacid as needed (cannot recall name)   vitamin B-12 (CYANOCOBALAMIN) 500 MCG tablet Take 500 mcg by mouth daily.   [DISCONTINUED] furosemide (LASIX) 20 MG tablet TAKE 1 TABLET BY MOUTH ONCE DAILY . APPOINTMENT REQUIRED FOR FUTURE REFILLS   [DISCONTINUED] hydrochlorothiazide (HYDRODIURIL) 50 MG tablet TAKE 1 TABLET BY MOUTH ONCE DAILY . APPOINTMENT REQUIRED FOR FUTURE REFILLS   [DISCONTINUED] levothyroxine (SYNTHROID) 175 MCG tablet TAKE 1 TABLET BY MOUTH ONCE DAILY BEFORE BREAKFAST.   [DISCONTINUED] lidocaine (XYLOCAINE) 5 % ointment APPLY 1 APPLICATION TOPICALLY AS NEEDED   [DISCONTINUED] losartan (COZAAR) 100 MG tablet Take 1 tablet by mouth once daily   No facility-administered medications prior to visit.    Review of Systems  HENT:  Negative for hearing loss.   Eyes:  Negative for blurred vision.  Respiratory:  Negative for shortness of breath.   Cardiovascular:  Negative for chest pain.  Gastrointestinal: Negative.   Genitourinary: Negative.   Musculoskeletal:  Negative for back pain.  Neurological:  Negative for headaches.  Psychiatric/Behavioral:  Negative for depression.        Objective:     BP 138/88 (BP Location: Left Arm, Patient Position: Sitting, Cuff Size: Large)   Pulse 80   Temp 98 F (36.7 C) (Oral)   Ht 5' 0.25" (1.53 m)   Wt 279 lb 3.2 oz (126.6 kg)   SpO2 98%   BMI 54.08 kg/m    Physical Exam Vitals reviewed. Exam conducted with a  chaperone present.  Constitutional:      Appearance: Normal appearance. She is morbidly obese.  Cardiovascular:     Rate and Rhythm: Normal rate and regular rhythm.     Pulses: Normal pulses.     Heart sounds: Normal heart sounds.  Pulmonary:     Effort: Pulmonary effort is normal.  Chest:  Breasts:    Tanner Score is 5.  Abdominal:     General: Bowel sounds are normal.  Genitourinary:    General: Normal vulva.     Exam position: Lithotomy position.     Tanner stage (genital): 5.     Vagina: Normal.     Cervix: Normal.     Uterus: Normal.      Adnexa: Right adnexa normal and left adnexa normal.     Rectum: Normal.  Musculoskeletal:     Right lower leg: Edema (legs are wrapped BL, there is trace edema in the ankles BL) present.     Left lower leg: Edema present.  Neurological:     General: No focal deficit present.     Mental Status: She is alert and oriented to person, place, and time.  Psychiatric:        Mood and Affect: Mood and affect normal.        Behavior: Behavior normal.     No results found for any visits on 03/05/23.     Assessment & Plan:    Routine Health Maintenance and Physical Exam  Immunization History  Administered Date(s) Administered   Influenza,inj,Quad PF,6+ Mos 02/03/2019, 05/03/2020, 03/28/2021, 02/18/2022   PFIZER(Purple Top)SARS-COV-2 Vaccination 06/04/2019, 06/26/2019   Zoster Recombinant(Shingrix) 02/03/2019    Health Maintenance  Topic Date Due   DTaP/Tdap/Td (1 - Tdap) Never done   Cervical Cancer Screening (HPV/Pap Cotest)  Never done   Colonoscopy  Never done   Zoster Vaccines- Shingrix (2 of 2) 03/31/2019   INFLUENZA VACCINE  11/07/2022   COVID-19 Vaccine (3 - 2023-24 season) 12/08/2022   HIV Screening  03/04/2024 (Originally 01/06/1974)   MAMMOGRAM  09/17/2024   Hepatitis C Screening  Completed   HPV VACCINES  Aged Out    Discussed health benefits of physical activity, and encouraged her to engage in regular exercise  appropriate for her age and condition.  Diabetes mellitus screening -     Hemoglobin A1c  Lower extremity edema -     Furosemide; Take 1 tablet (20 mg total) by mouth daily.  Dispense: 90 tablet; Refill: 1  Hypertension, unspecified type -     hydroCHLOROthiazide; Take 1 tablet (50 mg total) by mouth daily.  Dispense: 90 tablet; Refill: 1 -     Losartan Potassium; Take 1 tablet by mouth once daily  Dispense: 90 tablet; Refill: 1 -  Comprehensive metabolic panel  Hypothyroidism, unspecified type -     Levothyroxine Sodium; TAKE 1 TABLET BY MOUTH ONCE DAILY BEFORE BREAKFAST.  Dispense: 90 tablet; Refill: 1 -     TSH  Acute right-sided low back pain without sciatica -     Lidocaine; Apply 1 Application topically as needed.  Dispense: 36 g; Refill: 2  Routine general medical examination at a health care facility  Cervical cancer screening -     Cytology - PAP  Lipid screening -     Lipid panel; Future  Need for prophylactic vaccination and inoculation against influenza -     Flu vaccine trivalent PF, 6mos and older(Flulaval,Afluria,Fluarix,Fluzone)   Normal physical exam findings today, pap obtained and sent. No new symptoms or issues. She did need refills on her medications today and I did these for her, annual labs ordered for surveillance. I will see her back in 6 months for follow up of her chronic medical conditions. Counseled patient on increasing physical activity, handouts given on healthy eating and exercise.  Return in about 6 months (around 09/02/2023) for HTN.     Karie Georges, MD

## 2023-03-05 NOTE — Telephone Encounter (Signed)
Pt is aware of below message/recommendations and voiced her understanding.  Nothing further needed.

## 2023-03-05 NOTE — Telephone Encounter (Signed)
Recommend changing her inhaler if she has been treated numerous times for thrush. I sent Symbicort 80 mcg 2 puffs Twice daily. Brush tongue and rinse mouth afterwards. Use with spacer (also sent this to the pharmacy) I sent in diflucan for recurrent thrush. 2 tablets on day one then 1 tablet daily for 7 days (total 8 days). Thanks

## 2023-03-05 NOTE — Patient Instructions (Signed)

## 2023-03-05 NOTE — Telephone Encounter (Signed)
Patient has requested Nystatin multiple times since August, started [place] in February. Patient saw PCP today with no complaint of thrush noted.   LVM for patient to call back - need more info on symptoms

## 2023-03-05 NOTE — Telephone Encounter (Signed)
Called and spoke to patient.  She is requesting refill on Nystatin.  C/o white patches on cheeks and both sides of mouth are sore/raw and it causes difficulty eating. She rinses her mouth well after using Breo. She started [name] in February and sx have been intermittent since.   Katie, please advise. Thanks

## 2023-03-07 LAB — CYTOLOGY - PAP
Comment: NEGATIVE
Diagnosis: NEGATIVE
High risk HPV: NEGATIVE

## 2023-04-16 ENCOUNTER — Ambulatory Visit (INDEPENDENT_AMBULATORY_CARE_PROVIDER_SITE_OTHER): Payer: 59

## 2023-04-16 ENCOUNTER — Encounter: Payer: Self-pay | Admitting: Nurse Practitioner

## 2023-04-16 ENCOUNTER — Ambulatory Visit: Payer: 59 | Admitting: Nurse Practitioner

## 2023-04-16 VITALS — BP 122/80 | HR 85 | Ht 61.0 in | Wt 276.0 lb

## 2023-04-16 DIAGNOSIS — G4733 Obstructive sleep apnea (adult) (pediatric): Secondary | ICD-10-CM | POA: Diagnosis not present

## 2023-04-16 DIAGNOSIS — J4540 Moderate persistent asthma, uncomplicated: Secondary | ICD-10-CM

## 2023-04-16 DIAGNOSIS — R0609 Other forms of dyspnea: Secondary | ICD-10-CM | POA: Diagnosis not present

## 2023-04-16 LAB — POCT EXHALED NITRIC OXIDE: FeNO level (ppb): 5

## 2023-04-16 MED ORDER — BUDESONIDE-FORMOTEROL FUMARATE 80-4.5 MCG/ACT IN AERO: 2.0000 | INHALATION_SPRAY | Freq: Two times a day (BID) | RESPIRATORY_TRACT | 12 refills | Status: AC

## 2023-04-16 NOTE — Patient Instructions (Signed)
 Continue to use CPAP every night, minimum of 4-6 hours a night.  Change equipment every 30 days or as directed by DME. Wash your tubing with warm soap and water daily, hang to dry. Wash humidifier portion weekly.  Be aware of reduced alertness and do not drive or operate heavy machinery if experiencing this or drowsiness.  Exercise encouraged, as tolerated. Notify if persistent daytime sleepiness occurs even with consistent use of CPAP.   Get a new SD card for your CPAP    Stop Breo. Switch to Symbicort  2 puffs Twice daily. Brush tongue and rinse mouth afterwards. Use with spacer    Repeat chest x ray today  Call me if the cough gets worse or you don't start feeling back to normal with the inhaler    Follow up with Dr. Jude or Izetta Rouleau, NP in 6 weeks to see how inhaler is working. If symptoms do not improve or worsen, please contact office for sooner follow up or seek emergency care.

## 2023-04-16 NOTE — Progress Notes (Signed)
 @Patient  ID: [name], female    DOB: September 13, 1960, 63 y.o.   MRN: [id number]  Chief Complaint  Patient presents with   Follow-up    Pt f/u concerned about lung nodule, pt was using breo inhaler, states it broke her mouth at. Was prescribed albuterol, working great    Referring provider: Ozell Heron HERO, MD  HPI: 63 year old female, never smoker followed for OSA on CPAP and DOE.  She is patient Dr. Cyndi and last seen in office 05/23/2022.  Past medical history significant for hypertension, hypothyroid, osteoporosis.  TEST/EVENTS:  2015 [facility] [place]: Severe OSA, AHI 80/h 05/23/2022 PFT: FVC 60, FEV1 62, ratio 80, TLC 76, DLCO 85. Moderate restriction 06/02/2022 HRCT chest: atherosclerosis. Enlarged pulmonic trunk. Negative for ILD. Mild scarring. Air trapping.   07/03/2020: OV with Dr. Jude.  She was diagnosed around 5 years ago with a sleep study done in [place] which showed AHI of 80/h.  Still using old supplies.  Has not renewed this for a year and a half.  Reports leak around her machine.  Wakes up with dryness of the mouth.  No headaches.  Very compliant.  Plan to set her up with a local DME to get her new CPAP supplies including filter, hose, new airFit F20 small fullface mask.  CPAP settings are set at 7-12 cmH2O.  Seem to be working well for her.  Average pressure of 9 cm.  Large leak is likely related to old mask and interface wearing off.  She does complain of some dyspnea on exertion.  Likely related to obesity and deconditioning.  04/26/2022: OV with Barkley Kratochvil NP for follow-up with her husband, who I am also seeing as a patient.  She has been doing well with her CPAP since she was here last almost 2 years ago.  She wears it nightly.  Not having any difficulties.  Is having some leaks, which she attributes to an old mask.  Usually these correct when she switches her supplies out.  Her machine is relatively old.  She has not gotten a new 1 since her initial study around 8 or so  years ago.  It seems to be working okay for right now.  She would like to see if a new 1 would be something that is affordable for her.  Denies drowsy driving, morning headaches, excessive daytime fatigue.  She has been having some more trouble with shortness of breath on exertion.  She says that after long distances, stairs or uphill climbing she tends to get winded and has to rest to catch her breath.  This has been ongoing for a long time now but her PCP recently had something to her about it.  She thinks that it might have gotten worse over the last few years.  She also notices some associated wheezing at times.  Denies any cough, chest congestion, fevers, night sweats, lower extremity swelling, orthopnea, PND.  She does not have a history of childhood asthma.  No recurrent bronchitis.  No significant occupational or environmental exposures.  Does not notice that she really has any environmental allergies.  She was exposed to secondhand smoke for a long time.  Her father had COPD.  She is a never smoker.  05/23/2022: OV with Tycen Dockter NP for follow-up after undergoing pulmonary function testing which revealed a moderate restrictive defect and normal diffusing capacity.  No formal diagnosis of obstruction.  She did have some mid flow reversibility.  She tells me today that she never really tried  the albuterol at home.  She does not recall the pharmacy of her calling to tell her if this was available.  She did notice that after she used the albuterol during her PFT today she felt her chest was a bit more open.  She continues to have trouble with shortness of breath upon exertion and some associated wheezing.  Occasionally has some trouble with taking a deep breath in.  She denies any fevers, chills, chest congestion, night sweats, cough, lower extremity swelling, orthopnea, PND.  She is very compliant with her CPAP therapy.  She was exposed to secondhand smoke for a long time.  Does not have any significant  occupational or environmental exposures.  She does not have any systemic symptoms of autoimmune type illnesses. FeNO <5 ppb   04/16/2023: Today - follow up Patient presents today for acute visit. She got sick late November/early December. 12/2 - went to UC with cough and congestion; obtained CXR which showed some nodularity. She was treated with steroids and cough medicine. She was also sent a refill of albuterol. No abx. She had stopped Breo due to thrush.  Today, she tells me that she is feeling better. Feels like she's almost back to her normal. She still has a little bit of a cough, dry. Her breathing feels like it's back to her baseline. Still gets winded with longer distances. She did feel like the breo helped her before but kept getting thrush. She has some occasional wheezing. Denies fevers, chills, lower extremity swelling, orthopnea, PND. No significant sinus symptoms. Albuterol does help; using it once a day.    Allergies  Allergen Reactions   Ivp Dye [Iodinated Contrast Media]     Hives, hot   Sulfa Antibiotics     Hives, hot flashes    Immunization History  Administered Date(s) Administered   Influenza, Seasonal, Injecte, Preservative Fre 03/05/2023   Influenza,inj,Quad PF,6+ Mos 02/03/2019, 05/03/2020, 03/28/2021, 02/18/2022   PFIZER(Purple Top)SARS-COV-2 Vaccination 06/04/2019, 06/26/2019   Zoster Recombinant(Shingrix ) 02/03/2019    Past Medical History:  Diagnosis Date   Basal cell carcinoma (BCC) in situ of skin    Hypertension    Hypothyroid    Nephrolithiasis    OSA (obstructive sleep apnea)     Tobacco History: Social History   Tobacco Use  Smoking Status Never  Smokeless Tobacco Never   Counseling given: Not Answered   Outpatient Medications Prior to Visit  Medication Sig Dispense Refill   acetaminophen (TYLENOL) 500 MG tablet Take 500 mg by mouth as needed.     albuterol (VENTOLIN HFA) 108 (90 Base) MCG/ACT inhaler Inhale 1 puff into the lungs every 4  (four) hours as needed.     alendronate  (FOSAMAX ) 70 MG tablet Take 1 tablet (70 mg total) by mouth every 7 (seven) days. Take with a full glass of water on an empty stomach. 12 tablet 1   Calcium  Carb-Cholecalciferol (CALCIUM  600 + D) 600-200 MG-UNIT TABS Take 1 tablet by mouth daily. 30 tablet 0   diphenhydrAMINE (BENADRYL) 25 MG tablet Take 25 mg by mouth as needed.     diphenhydramine-acetaminophen (TYLENOL PM) 25-500 MG TABS tablet Take 1 tablet by mouth at bedtime as needed.     ELDERBERRY PO Take by mouth.     famotidine (PEPCID) 20 MG tablet Take 20 mg by mouth daily as needed for heartburn or indigestion.     Ferrous Sulfate  Dried (FERROUS SULFATE  IRON ) 200 (65 Fe) MG TABS Take 1 tablet by mouth daily. 30 tablet  furosemide  (LASIX ) 20 MG tablet Take 1 tablet (20 mg total) by mouth daily. 90 tablet 1   hydrochlorothiazide  (HYDRODIURIL ) 50 MG tablet Take 1 tablet (50 mg total) by mouth daily. 90 tablet 1   levothyroxine  (SYNTHROID ) 175 MCG tablet TAKE 1 TABLET BY MOUTH ONCE DAILY BEFORE BREAKFAST. 90 tablet 1   lidocaine  (XYLOCAINE ) 5 % ointment Apply 1 Application topically as needed. 36 g 2   losartan  (COZAAR ) 100 MG tablet Take 1 tablet by mouth once daily 90 tablet 1   mirabegron  ER (MYRBETRIQ ) 25 MG TB24 tablet Take 1 tablet (25 mg total) by mouth daily. 14 tablet 0   Multiple Vitamins-Minerals (ONE-A-DAY MENOPAUSE FORMULA) TABS Take 1 tablet by mouth daily.     naproxen  sodium (ALEVE ) 220 MG tablet Take 1 tablet (220 mg total) by mouth 2 (two) times daily as needed. (Patient taking differently: Take 440 mg by mouth daily as needed.)     OVER THE COUNTER MEDICATION OTC antacid as needed (cannot recall name)     Spacer/Aero-Holding Raguel FRENCH Use with Symbicort  1 each 1   vitamin B-12 (CYANOCOBALAMIN) 500 MCG tablet Take 500 mcg by mouth daily.     fluconazole  (DIFLUCAN ) 100 MG tablet Take 2 tablets (200 mg) on day one then 1 tablet (100 mg) daily for 7 additional days. (Patient  not taking: Reported on 04/16/2023) 9 tablet 0   nystatin  (MYCOSTATIN ) 100000 UNIT/ML suspension TAKE 5 ML BY MOUTH  4 TIMES DAILY FOR 10 DAYS (Patient not taking: Reported on 04/16/2023) 200 mL 0   budesonide -formoterol  (SYMBICORT ) 80-4.5 MCG/ACT inhaler Inhale 2 puffs into the lungs in the morning and at bedtime. (Patient not taking: Reported on 04/16/2023) 10.2 g 12   No facility-administered medications prior to visit.     Review of Systems:   Constitutional: No weight loss or gain, night sweats, fevers, chills, fatigue, or lassitude. HEENT: No headaches, difficulty swallowing, tooth/dental problems, or sore throat. No sneezing, itching, ear ache, nasal congestion, or post nasal drip CV:  No chest pain, orthopnea, PND, swelling in lower extremities, anasarca, dizziness, palpitations, syncope Resp: +shortness of breath with exertion; occasional wheezing; cough (improving). No excess mucus or change in color of mucus. No hemoptysis. No chest wall deformity GI:  No heartburn, indigestion GU: No dysuria, change in color of urine, urgency or frequency.   Skin: No rash, lesions, ulcerations MSK:  No joint pain or swelling.   Neuro: No dizziness or lightheadedness.  Psych: No depression or anxiety. Mood stable.     Physical Exam:  BP 122/80   Pulse 85   Ht 5' 1 (1.549 m)   Wt 276 lb (125.2 kg)   SpO2 98%   BMI 52.15 kg/m   GEN: Pleasant, interactive, well-appearing; morbidly obese; in no acute distress. HEENT:  Normocephalic and atraumatic. PERRLA. Sclera white. Nasal turbinates pink, moist and patent bilaterally. No rhinorrhea present. Oropharynx pink and moist, without exudate or edema. No lesions, ulcerations, or postnasal drip. Mallampati III NECK:  Supple w/ fair ROM. No JVD present. Normal carotid impulses w/o bruits. Thyroid  symmetrical with no goiter or nodules palpated. No lymphadenopathy.   CV: RRR, no m/r/g, no peripheral edema. Pulses intact, +2 bilaterally. No cyanosis,  pallor or clubbing. PULMONARY:  Unlabored, regular breathing. Clear bilaterally A&P w/o wheezes/rales/rhonchi. No accessory muscle use.  GI: BS present and normoactive. Soft, non-tender to palpation. No organomegaly or masses detected. MSK: No erythema, warmth or tenderness. Cap refil <2 sec all extrem. No deformities or joint swelling  noted.  Neuro: A/Ox3. No focal deficits noted.   Skin: Warm, no lesions or rashe Psych: Normal affect and behavior. Judgement and thought content appropriate.     Lab Results:  CBC    Component Value Date/Time   WBC 7.0 03/28/2021 1037   RBC 3.80 (L) 03/28/2021 1037   HGB 12.1 03/28/2021 1037   HCT 36.2 03/28/2021 1037   PLT 190.0 03/28/2021 1037   MCV 95.2 03/28/2021 1037   MCHC 33.5 03/28/2021 1037   RDW 13.2 03/28/2021 1037   LYMPHSABS 2.0 03/28/2021 1037   MONOABS 0.5 03/28/2021 1037   EOSABS 0.2 03/28/2021 1037   BASOSABS 0.0 03/28/2021 1037    BMET    Component Value Date/Time   NA 139 03/05/2023 0953   K 3.9 03/05/2023 0953   CL 101 03/05/2023 0953   CO2 29 03/05/2023 0953   GLUCOSE 93 03/05/2023 0953   BUN 23 03/05/2023 0953   CREATININE 1.03 03/05/2023 0953   CALCIUM  9.9 03/05/2023 0953    BNP No results found for: BNP   Imaging:  DG Chest 2 View Result Date: 04/16/2023 CLINICAL DATA:  Cough.  Previously reported lung nodule. EXAM: CHEST - 2 VIEW COMPARISON:  Chest CT dated 06/02/2022. FINDINGS: No focal consolidation, pleural effusion, or pneumothorax. The cardiac silhouette is within limits. Dilated main pulmonary trunks suggestive of pulmonary hypertension. No acute osseous pathology. Degenerative changes of the spine. IMPRESSION: 1. No active cardiopulmonary disease. 2. Findings suggestive of pulmonary hypertension. Electronically Signed   By: Vanetta Chou M.D.   On: 04/16/2023 17:37    Administration History     None          Latest Ref Rng & Units 05/23/2022    2:50 PM  PFT Results  FVC-Pre L 1.73    FVC-Predicted Pre % 60   FVC-Post L 1.85   FVC-Predicted Post % 64   Pre FEV1/FVC % % 79   Post FEV1/FCV % % 80   FEV1-Pre L 1.37   FEV1-Predicted Pre % 62   FEV1-Post L 1.48   DLCO uncorrected ml/min/mmHg 15.48   DLCO UNC% % 85   DLCO corrected ml/min/mmHg 15.48   DLCO COR %Predicted % 85   DLVA Predicted % 108   TLC L 3.53   TLC % Predicted % 76   RV % Predicted % 65     No results found for: NITRICOXIDE      Assessment & Plan:   Reactive airway disease Concern for underlying asthma/reactive airway disease with air trapping on previous imaging. Improved with ICS/LABA but unable to tolerate Breo due to recurrent thrush. Recent bronchitic illness; clinically improving. FeNO nl today. She is still requiring SABA frequently. Will restart her on ICS/LABA with HFA style inhaler and add on spacer. Oral hygiene reviewed.  Previous CXR with noted nodularity per UC provider. Repeat CXR today. She is a never smoker. Possibly infectious/inflammatory.   Patient Instructions  Continue to use CPAP every night, minimum of 4-6 hours a night.  Change equipment every 30 days or as directed by DME. Wash your tubing with warm soap and water daily, hang to dry. Wash humidifier portion weekly.  Be aware of reduced alertness and do not drive or operate heavy machinery if experiencing this or drowsiness.  Exercise encouraged, as tolerated. Notify if persistent daytime sleepiness occurs even with consistent use of CPAP.   Get a new SD card for your CPAP    Stop Breo. Switch to Symbicort  2 puffs Twice daily.  Brush tongue and rinse mouth afterwards. Use with spacer    Repeat chest x ray today  Call me if the cough gets worse or you don't start feeling back to normal with the inhaler    Follow up with Dr. Jude or Izetta Rouleau, NP in 6 weeks to see how inhaler is working. If symptoms do not improve or worsen, please contact office for sooner follow up or seek emergency care.    OSA on CPAP OSA  on CPAP. Excellent compliance in the past. She has lost her SD card. She will obtain a new one. Assess download at follow up. Understands risk of untreated OSA. Aware of safe driving practices.     I spent 35 minutes of dedicated to the care of this patient on the date of this encounter to include pre-visit review of records, face-to-face time with the patient discussing conditions above, post visit ordering of testing, clinical documentation with the electronic health record, making appropriate referrals as documented, and communicating necessary findings to members of the patients care team.  Comer LULLA Rouleau, NP 04/17/2023  Pt aware and understands NP's role.

## 2023-04-17 ENCOUNTER — Other Ambulatory Visit: Payer: Self-pay | Admitting: Nurse Practitioner

## 2023-04-17 DIAGNOSIS — J45909 Unspecified asthma, uncomplicated: Secondary | ICD-10-CM | POA: Insufficient documentation

## 2023-04-17 DIAGNOSIS — I288 Other diseases of pulmonary vessels: Secondary | ICD-10-CM

## 2023-04-17 DIAGNOSIS — R0609 Other forms of dyspnea: Secondary | ICD-10-CM

## 2023-04-17 NOTE — Assessment & Plan Note (Addendum)
 Concern for underlying asthma/reactive airway disease with air trapping on previous imaging. Improved with ICS/LABA but unable to tolerate Breo due to recurrent thrush. Recent bronchitic illness; clinically improving. FeNO nl today. She is still requiring SABA frequently. Will restart her on ICS/LABA with HFA style inhaler and add on spacer. Oral hygiene reviewed.  Previous CXR with noted nodularity per UC provider. Repeat CXR today. She is a never smoker. Possibly infectious/inflammatory.   Patient Instructions  Continue to use CPAP every night, minimum of 4-6 hours a night.  Change equipment every 30 days or as directed by DME. Wash your tubing with warm soap and water daily, hang to dry. Wash humidifier portion weekly.  Be aware of reduced alertness and do not drive or operate heavy machinery if experiencing this or drowsiness.  Exercise encouraged, as tolerated. Notify if persistent daytime sleepiness occurs even with consistent use of CPAP.   Get a new SD card for your CPAP    Stop Breo. Switch to Symbicort  2 puffs Twice daily. Brush tongue and rinse mouth afterwards. Use with spacer    Repeat chest x ray today  Call me if the cough gets worse or you don't start feeling back to normal with the inhaler    Follow up with Dr. Jude or Izetta Rouleau, NP in 6 weeks to see how inhaler is working. If symptoms do not improve or worsen, please contact office for sooner follow up or seek emergency care.

## 2023-04-17 NOTE — Assessment & Plan Note (Signed)
 OSA on CPAP. Excellent compliance in the past. She has lost her SD card. She will obtain a new one. Assess download at follow up. Understands risk of untreated OSA. Aware of safe driving practices.

## 2023-04-18 ENCOUNTER — Encounter: Payer: Self-pay | Admitting: Nurse Practitioner

## 2023-04-23 ENCOUNTER — Ambulatory Visit (INDEPENDENT_AMBULATORY_CARE_PROVIDER_SITE_OTHER): Payer: 59 | Admitting: Family Medicine

## 2023-04-23 ENCOUNTER — Telehealth: Payer: Self-pay | Admitting: Nurse Practitioner

## 2023-04-23 ENCOUNTER — Encounter: Payer: Self-pay | Admitting: Family Medicine

## 2023-04-23 VITALS — BP 124/88 | HR 100 | Temp 97.6°F | Wt 276.6 lb

## 2023-04-23 DIAGNOSIS — R600 Localized edema: Secondary | ICD-10-CM

## 2023-04-23 DIAGNOSIS — R21 Rash and other nonspecific skin eruption: Secondary | ICD-10-CM

## 2023-04-23 MED ORDER — TRIAMCINOLONE ACETONIDE 0.1 % EX CREA: 1.0000 | TOPICAL_CREAM | Freq: Two times a day (BID) | CUTANEOUS | 0 refills | Status: DC | PRN

## 2023-04-23 NOTE — Patient Instructions (Signed)
 Leave off Neosporin and other over-the-counter medications  Elevate legs frequently  Continue compression to help control edema  Try the prescription medication topically up to twice daily.  Watch closely for any signs of infection such as increased redness, warmth, or increased swelling/pain

## 2023-04-23 NOTE — Progress Notes (Signed)
 Established Patient Office Visit  Subjective   Patient ID: [name], female    DOB: [birth year]  Age: 63 y.o. MRN: [id number]  Chief Complaint  Patient presents with   Rash    Patient complains of rash on left leg, x2 weeks     HPI   [name] has history of hypertension, obstructive sleep apnea, hypothyroidism, obesity.  She is seen today with some discoloration pruritus and little bit of rash on her left medial ankle region.  This was first noted a couple weeks ago.  She has had perhaps a little bit more swelling than usual over her lower extremities but this is chronic.  She applied some Neosporin and rash seem to be worse.  She has some element of pain but mostly pruritus.  Nonscaly.  Does use some compression intermittently but no compression stockings.  She has wraps that she uses on her lower legs.  No recent orthopnea.  No fever or chills.  Past Medical History:  Diagnosis Date   Basal cell carcinoma (BCC) in situ of skin    Hypertension    Hypothyroid    Nephrolithiasis    OSA (obstructive sleep apnea)    Past Surgical History:  Procedure Laterality Date   CHOLECYSTECTOMY     ELBOW FRACTURE SURGERY     LITHOTRIPSY Left 1995   SCLEROTHERAPY      reports that she has never smoked. She has never used smokeless tobacco. She reports that she does not drink alcohol and does not use drugs. family history includes Alzheimer's disease in her paternal grandmother; Asthma in her father; COPD in her father; Diabetes Mellitus I in her sister; Heart disease in her maternal grandfather, maternal grandmother, and paternal grandfather; Hypertension in her mother; Lymphoma in her mother; Pancreatic cancer in her father; Squamous cell carcinoma in her father; Thyroid  disease in her paternal grandmother. Allergies  Allergen Reactions   Ivp Dye [Iodinated Contrast Media]     Hives, hot   Sulfa Antibiotics     Hives, hot flashes    Review of Systems  Constitutional:  Negative  for chills and fever.      Objective:     BP 124/88 (BP Location: Left Arm, Patient Position: Sitting, Cuff Size: Large)   Pulse 100   Temp 97.6 F (36.4 C) (Oral)   Wt 276 lb 9.6 oz (125.5 kg)   SpO2 98%   BMI 52.26 kg/m  BP Readings from Last 3 Encounters:  04/23/23 124/88  04/16/23 122/80  03/05/23 138/88   Wt Readings from Last 3 Encounters:  04/23/23 276 lb 9.6 oz (125.5 kg)  04/16/23 276 lb (125.2 kg)  03/05/23 279 lb 3.2 oz (126.6 kg)      Physical Exam Vitals reviewed.  Constitutional:      General: She is not in acute distress.    Appearance: She is not ill-appearing.  Cardiovascular:     Rate and Rhythm: Normal rate and regular rhythm.  Pulmonary:     Effort: Pulmonary effort is normal. No respiratory distress.     Breath sounds: No rales.  Musculoskeletal:     Comments: Just some lower extremity edema nonpitting lower legs and ankles bilaterally.  Mild hyperemia left lower leg and medial ankle region.  No warmth.  Nontender to palpation.  No visible ulceration at this time.  Neurological:     Mental Status: She is alert.      No results found for any visits on 04/23/23.  The ASCVD Risk score (Arnett DK, et al., 2019) failed to calculate for the following reasons:   Unable to determine if patient is Non-Hispanic African American    Assessment & Plan:   Patient has chronic lower extremity edema.  She has limited mild discoloration left medial leg and ankle region.  No evidence for cellulitis at this time.  Suspect mostly venous stasis.  No ulcers at this time but high risk for ulceration.  Recommend leave off Neosporin.  Continue measures to reduce edema with elevation and compression. Trial of triamcinolone  0.1% cream to use up to twice daily on medial ankle for any pruritus symptoms.  Follow-up promptly for any progressive redness, fever, or other concerns   [name] Lamy, MD

## 2023-04-23 NOTE — Telephone Encounter (Signed)
 Patient checking on scheduling Echo. Patient phone number is [phone] and [phone].

## 2023-04-24 ENCOUNTER — Other Ambulatory Visit: Payer: Self-pay

## 2023-04-24 ENCOUNTER — Inpatient Hospital Stay
Admission: RE | Admit: 2023-04-24 | Discharge: 2023-04-24 | Disposition: A | Discharge status: Home / Self Care | Payer: Self-pay | Source: Ambulatory Visit | Attending: Nurse Practitioner | Admitting: Nurse Practitioner

## 2023-04-24 DIAGNOSIS — R0609 Other forms of dyspnea: Secondary | ICD-10-CM

## 2023-05-01 ENCOUNTER — Inpatient Hospital Stay
Admission: RE | Admit: 2023-05-01 | Discharge: 2023-05-01 | Disposition: A | Discharge status: Home / Self Care | Payer: Self-pay | Source: Ambulatory Visit | Attending: Nurse Practitioner | Admitting: Nurse Practitioner

## 2023-05-01 ENCOUNTER — Telehealth: Payer: Self-pay

## 2023-05-01 ENCOUNTER — Other Ambulatory Visit: Payer: Self-pay

## 2023-05-01 DIAGNOSIS — R0609 Other forms of dyspnea: Secondary | ICD-10-CM

## 2023-05-01 NOTE — Telephone Encounter (Signed)
Pt states she was suppose to have an ECHO done ordered by [facility] for pulmonary hypertension and states nobody ever called her to have this scheduled.  Checked in the pt's chart, and verbally discussed with Florentina Addison, Np and the order was placed on 04-17-23. Pt should receive a phone call to have the echo scheduled closer to time to have it done. I informed pt of this. Pt verbalized understanding. NFN.

## 2023-05-12 ENCOUNTER — Ambulatory Visit: Payer: 59 | Admitting: Family Medicine

## 2023-05-12 VITALS — BP 134/80 | HR 91 | Temp 97.6°F | Wt 271.3 lb

## 2023-05-12 DIAGNOSIS — J069 Acute upper respiratory infection, unspecified: Secondary | ICD-10-CM | POA: Diagnosis not present

## 2023-05-12 DIAGNOSIS — H6001 Abscess of right external ear: Secondary | ICD-10-CM | POA: Diagnosis not present

## 2023-05-12 MED ORDER — FLUCONAZOLE 150 MG PO TABS
150.0000 mg | ORAL_TABLET | Freq: Once | ORAL | 0 refills | Status: AC
Start: 2023-05-12 — End: 2023-05-12

## 2023-05-12 MED ORDER — OFLOXACIN 0.3 % OT SOLN: 5.0000 [drp] | Freq: Two times a day (BID) | OTIC | 0 refills | Status: DC

## 2023-05-12 MED ORDER — DOXYCYCLINE HYCLATE 100 MG PO CAPS: 100.0000 mg | ORAL_CAPSULE | Freq: Two times a day (BID) | ORAL | 0 refills | Status: DC

## 2023-05-12 NOTE — Progress Notes (Signed)
Established Patient Office Visit  Subjective   Patient ID: [name], female    DOB: [birth year]  Age: 63 y.o. MRN: [id number]  Chief Complaint  Patient presents with   Ear Pain    Patient complains of right ear pain, x5 days   Cough   Nasal Congestion    HPI   [name] is seen with right ear pain for the past 5 days.  She feels like this is in the canal.  She has been placing some cotton into the canal and is seen little bit of yellowish drainage past couple days.  No hearing changes.  She has also had some upper respiratory congestion symptoms and occasional cough.  She does work in [employer], [employer] and they have had multiple kids out recently with various types of infection.  She denies any fever.  Her right ear pain started last Wednesday.  She applied some hydrogen peroxide.  No fever.  Past Medical History:  Diagnosis Date   Basal cell carcinoma (BCC) in situ of skin    Hypertension    Hypothyroid    Nephrolithiasis    OSA (obstructive sleep apnea)    Past Surgical History:  Procedure Laterality Date   CHOLECYSTECTOMY     ELBOW FRACTURE SURGERY     LITHOTRIPSY Left 1995   SCLEROTHERAPY      reports that she has never smoked. She has never used smokeless tobacco. She reports that she does not drink alcohol and does not use drugs. family history includes Alzheimer's disease in her paternal grandmother; Asthma in her father; COPD in her father; Diabetes Mellitus I in her sister; Heart disease in her maternal grandfather, maternal grandmother, and paternal grandfather; Hypertension in her mother; Lymphoma in her mother; Pancreatic cancer in her father; Squamous cell carcinoma in her father; Thyroid disease in her paternal grandmother. Allergies  Allergen Reactions   Ivp Dye [Iodinated Contrast Media]     Hives, hot   Sulfa Antibiotics     Hives, hot flashes    Review of Systems  Constitutional:  Negative for chills and fever.  HENT:  Positive for  congestion, ear discharge and ear pain.   Respiratory:  Positive for cough.       Objective:     BP 134/80 (BP Location: Left Arm, Patient Position: Sitting, Cuff Size: Large)   Pulse 91   Temp 97.6 F (36.4 C) (Oral)   Wt 271 lb 4.8 oz (123.1 kg)   SpO2 97%   BMI 51.26 kg/m  BP Readings from Last 3 Encounters:  05/12/23 134/80  04/23/23 124/88  04/16/23 122/80   Wt Readings from Last 3 Encounters:  05/12/23 271 lb 4.8 oz (123.1 kg)  04/23/23 276 lb 9.6 oz (125.5 kg)  04/16/23 276 lb (125.2 kg)      Physical Exam Vitals reviewed.  Constitutional:      General: She is not in acute distress.    Appearance: She is not ill-appearing.  HENT:     Ears:     Comments: Left eardrum and ear canal appear normal.  She has furuncle right ear canal just inside the tragus.  There is some spontaneous drainage of pus.  We were able to use a Q-tip swab and apply some gentle pressure at the base and expressed a copious amount of purulence.  This was collected with several Q-tip swabs and cleared out of the canal.  Eardrum appears normal. Cardiovascular:     Rate and Rhythm: Normal rate and  regular rhythm.  Pulmonary:     Effort: Pulmonary effort is normal.     Breath sounds: Normal breath sounds. No wheezing or rales.  Musculoskeletal:     Cervical back: Neck supple.  Lymphadenopathy:     Cervical: No cervical adenopathy.  Neurological:     Mental Status: She is alert.      No results found for any visits on 05/12/23.    The ASCVD Risk score (Arnett DK, et al., 2019) failed to calculate for the following reasons:   Unable to determine if patient is Non-Hispanic African American    Assessment & Plan:   #1 abscess right external ear canal.  This appears to be a furuncle that is spontaneously draining.  We were able to express considerable amount of pus from this.  Start doxycycline 100 mg twice daily for 7 days.  Floxin otic eardrops 5 drops right ear canal twice daily.   Follow-up if pain and swelling not completely resolved within the next few days  #2 viral URI with cough.  Nonfocal lung exam.  Follow-up promptly for any fever or worsening symptoms.   No follow-ups on file.    Evelena Peat, MD

## 2023-05-12 NOTE — Patient Instructions (Signed)
Continue with warm compresses to right ear 2-3 times daily.

## 2023-05-23 ENCOUNTER — Encounter (HOSPITAL_BASED_OUTPATIENT_CLINIC_OR_DEPARTMENT_OTHER): Payer: 59 | Attending: General Surgery | Admitting: General Surgery

## 2023-05-23 DIAGNOSIS — I890 Lymphedema, not elsewhere classified: Secondary | ICD-10-CM | POA: Diagnosis not present

## 2023-05-23 DIAGNOSIS — L97322 Non-pressure chronic ulcer of left ankle with fat layer exposed: Secondary | ICD-10-CM | POA: Diagnosis present

## 2023-05-23 DIAGNOSIS — I872 Venous insufficiency (chronic) (peripheral): Secondary | ICD-10-CM | POA: Diagnosis not present

## 2023-05-28 ENCOUNTER — Encounter (HOSPITAL_BASED_OUTPATIENT_CLINIC_OR_DEPARTMENT_OTHER): Payer: 59 | Admitting: General Surgery

## 2023-05-28 DIAGNOSIS — L97322 Non-pressure chronic ulcer of left ankle with fat layer exposed: Secondary | ICD-10-CM | POA: Diagnosis not present

## 2023-05-29 ENCOUNTER — Telehealth: Payer: 59 | Admitting: Nurse Practitioner

## 2023-05-29 ENCOUNTER — Encounter: Payer: Self-pay | Admitting: Nurse Practitioner

## 2023-05-29 DIAGNOSIS — R0609 Other forms of dyspnea: Secondary | ICD-10-CM | POA: Diagnosis not present

## 2023-05-29 DIAGNOSIS — I288 Other diseases of pulmonary vessels: Secondary | ICD-10-CM

## 2023-05-29 DIAGNOSIS — G4733 Obstructive sleep apnea (adult) (pediatric): Secondary | ICD-10-CM

## 2023-05-29 DIAGNOSIS — J4530 Mild persistent asthma, uncomplicated: Secondary | ICD-10-CM

## 2023-05-29 NOTE — Assessment & Plan Note (Addendum)
Concern for underlying asthma/reactive airway disease with air trapping on previous imaging. Clinical benefit from ICS/LABA but unable to tolerate DPI due to recurrent thrush. Tolerating Symbicort well. Aware of proper technique. Action plan in place.  Previous CXR with noted nodularity per UC provider; unable to visualize. Repeat CXR without any evidence of nodules/masses/active pulmonary disease. She is a never smoker. Possibly infectious/inflammatory.   Patient Instructions  Continue to use CPAP every night, minimum of 4-6 hours a night.  Change equipment every 30 days or as directed by DME. Wash your tubing with warm soap and water daily, hang to dry. Wash humidifier portion weekly.  Be aware of reduced alertness and do not drive or operate heavy machinery if experiencing this or drowsiness.  Exercise encouraged, as tolerated. Notify if persistent daytime sleepiness occurs even with consistent use of CPAP.  Continue Symbicort 2 puffs Twice daily. Brush tongue and rinse mouth afterwards. Use with spacer  Continue Albuterol inhaler 2 puffs every 6 hours as needed for shortness of breath or wheezing. Notify if symptoms persist despite rescue inhaler/neb use.   Follow up with Dr. Vassie Loll or Rhunette Croft, NP in 6 weeks to see how inhaler is working. If symptoms do not improve or worsen, please contact office for sooner follow up or seek emergency care.

## 2023-05-29 NOTE — Patient Instructions (Addendum)
Continue to use CPAP every night, minimum of 4-6 hours a night.  Change equipment every 30 days or as directed by DME. Wash your tubing with warm soap and water daily, hang to dry. Wash humidifier portion weekly.  Be aware of reduced alertness and do not drive or operate heavy machinery if experiencing this or drowsiness.  Exercise encouraged, as tolerated. Notify if persistent daytime sleepiness occurs even with consistent use of CPAP.  Continue Symbicort 2 puffs Twice daily. Brush tongue and rinse mouth afterwards. Use with spacer  Continue Albuterol inhaler 2 puffs every 6 hours as needed for shortness of breath or wheezing. Notify if symptoms persist despite rescue inhaler/neb use.   Follow up with Dr. Vassie Loll or Rhunette Croft, NP in 6 weeks to see how inhaler is working. If symptoms do not improve or worsen, please contact office for sooner follow up or seek emergency care.

## 2023-05-29 NOTE — Assessment & Plan Note (Signed)
Suspect multifactorial related to body habitus, deconditioning, reactive airways, possible PH? See above plan. Encouraged to work on graded exercises

## 2023-05-29 NOTE — Progress Notes (Signed)
Patient ID: [name], female     DOB: 04-Dec-1960, 63 y.o.      MRN: [id number]  No chief complaint on file.   Virtual Visit via Video Note  I connected with [name] on 05/29/23 at  3:30 PM EST by a video enabled telemedicine application and verified that I am speaking with the correct person using two identifiers.  Location: Patient: Home Provider: Office   I discussed the limitations of evaluation and management by telemedicine and the availability of in person appointments. The patient expressed understanding and agreed to proceed.  History of Present Illness: 63 year old female, never smoker followed for OSA on CPAP and DOE.  She is patient Dr. Reginia [name] and last seen in office 04/16/2023 by [facility] NP.  Past medical history significant for hypertension, hypothyroid, osteoporosis.   TEST/EVENTS:  2015 NPSG [place]: Severe OSA, AHI 80/h 05/23/2022 PFT: FVC 60, FEV1 62, ratio 80, TLC 76, DLCO 85. Moderate restriction 06/02/2022 HRCT chest: atherosclerosis. Enlarged pulmonic trunk. Negative for ILD. Mild scarring. Air trapping.  04/16/2023 CXR: dilated PA. No active cardiopulmonary disease   07/03/2020: OV with Dr. Vassie [name]  She was diagnosed around 5 years ago with a sleep study done in [place] which showed AHI of 80/h.  Still using old supplies.  Has not renewed this for a year and a half.  Reports leak around her machine.  Wakes up with dryness of the mouth.  No headaches.  Very compliant.  Plan to set her up with a local DME to get her new CPAP supplies including filter, hose, new airFit F20 small fullface mask.  CPAP settings are set at 7-12 cmH2O.  Seem to be working well for her.  Average pressure of 9 cm.  Large leak is likely related to old mask and interface wearing off.  She does complain of some dyspnea on exertion.  Likely related to obesity and deconditioning.   04/26/2022: OV with [name] Narvaez NP for follow-up with her husband, who I am also seeing as a patient.  She has been  doing well with her CPAP since she was here last almost 2 years ago.  She wears it nightly.  Not having any difficulties.  Is having some leaks, which she attributes to an old mask.  Usually these correct when she switches her supplies out.  Her machine is relatively old.  She has not gotten a new 1 since her initial study around 8 or so years ago.  It seems to be working okay for right now.  She would like to see if a new 1 would be something that is affordable for her.  Denies drowsy driving, morning headaches, excessive daytime fatigue.   She has been having some more trouble with shortness of breath on exertion.  She says that after long distances, stairs or uphill climbing she tends to get winded and has to rest to catch her breath.  This has been ongoing for a long time now but her PCP recently had something to her about it.  She thinks that it might have gotten worse over the last few years.  She also notices some associated wheezing at times.  Denies any cough, chest congestion, fevers, night sweats, lower extremity swelling, orthopnea, PND.  She does not have a history of childhood asthma.  No recurrent bronchitis.  No significant occupational or environmental exposures.  Does not notice that she really has any environmental allergies.  She was exposed to secondhand smoke for a long time.  Her father had COPD.  She is a never smoker.   05/23/2022: OV with [name] Cinnamon NP for follow-up after undergoing pulmonary function testing which revealed a moderate restrictive defect and normal diffusing capacity.  No formal diagnosis of obstruction.  She did have some mid flow reversibility.  She tells me today that she never really tried the albuterol at home.  She does not recall the pharmacy of her calling to tell her if this was available.  She did notice that after she used the albuterol during her PFT today she felt her chest was a bit more open.  She continues to have trouble with shortness of breath upon exertion  and some associated wheezing.  Occasionally has some trouble with taking a deep breath in.  She denies any fevers, chills, chest congestion, night sweats, cough, lower extremity swelling, orthopnea, PND.  She is very compliant with her CPAP therapy.  She was exposed to secondhand smoke for a long time.  Does not have any significant occupational or environmental exposures.  She does not have any systemic symptoms of autoimmune type illnesses. FeNO <5 ppb    04/16/2023: OV with [name] Francesconi NP for acute visit. She got sick late November/early December. 12/2 - went to UC with cough and congestion; obtained CXR which showed some nodularity. She was treated with steroids and cough medicine. She was also sent a refill of albuterol. No abx. She had stopped Breo due to thrush.  Today, she tells me that she is feeling better. Feels like she's almost back to her normal. She still has a little bit of a cough, dry. Her breathing feels like it's back to her baseline. Still gets winded with longer distances. She did feel like the breo helped her before but kept getting thrush. She has some occasional wheezing. Denies fevers, chills, lower extremity swelling, orthopnea, PND. No significant sinus symptoms. Albuterol does help; using it once a day.   05/29/2023: Today - follow up Patient presents today for follow-up.  She was previously on Breo but kept having difficulties with thrush.  At her last appointment we switched her to Symbicort.  She feels like this is working much better for her.  Sure she feels like her breathing is better.  Cough is resolved.  Not had any further issues with thrush.  Using a spacer with this.  No wheezing or chest tightness.  Has not really had to use her albuterol.  Does still get winded with more strenuous activities and longer distances. Had evidence of enlarged pulmonary artery on previous imaging.  Never had echo.  She says that no one ever called her to schedule this.  Denies any issues with leg  swelling or orthopnea.  No weight gain. Wears her CPAP nightly.  Sleeps well with it.  Feels rested during the day for the most part.  No difficulties with pressures or mask fit.  No sleep parasomnia/paralysis, drowsy driving. No download available.   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     albuterol (VENTOLIN HFA) 108 (90 Base) MCG/ACT inhaler Inhale 1 puff into the lungs every 4 (four) hours as needed.     alendronate (FOSAMAX) 70 MG tablet Take 1 tablet (70 mg total) by mouth every 7 (seven) days. Take with a full glass of water on an empty stomach. 12 tablet 1   budesonide-formoterol (SYMBICORT) 80-4.5 MCG/ACT inhaler Inhale 2 puffs into the lungs in the morning and at bedtime. 10.2 g 12   Calcium Carb-Cholecalciferol (CALCIUM 600 + D) 600-200 MG-UNIT TABS Take 1 tablet by mouth daily. 30 tablet 0   diphenhydrAMINE (BENADRYL) 25 MG tablet Take 25 mg by mouth as needed.     diphenhydramine-acetaminophen (TYLENOL PM) 25-500 MG TABS tablet Take 1 tablet by mouth at bedtime as needed.     doxycycline (VIBRAMYCIN) 100 MG capsule Take 1 capsule (100 mg  total) by mouth 2 (two) times daily. 14 capsule 0   ELDERBERRY PO Take by mouth.     famotidine (PEPCID) 20 MG tablet Take 20 mg by mouth daily as needed for heartburn or indigestion.     Ferrous Sulfate Dried (FERROUS SULFATE IRON) 200 (65 Fe) MG TABS Take 1 tablet by mouth daily. 30 tablet    fluconazole (DIFLUCAN) 100 MG tablet Take 2 tablets (200 mg) on day one then 1 tablet (100 mg) daily for 7 additional days. (Patient not taking: Reported on 04/16/2023) 9 tablet 0   furosemide (LASIX) 20 MG tablet Take 1 tablet (20 mg total) by mouth daily. 90 tablet 1   hydrochlorothiazide (HYDRODIURIL) 50 MG tablet Take 1 tablet (50 mg total) by mouth daily. 90 tablet 1   levothyroxine (SYNTHROID) 175 MCG tablet TAKE 1 TABLET BY MOUTH ONCE DAILY BEFORE BREAKFAST. 90 tablet 1   lidocaine (XYLOCAINE) 5 % ointment Apply 1 Application topically as needed. 36 g 2   losartan (COZAAR) 100 MG tablet Take 1 tablet by mouth once daily 90 tablet 1   mirabegron ER (MYRBETRIQ) 25 MG TB24 tablet Take 1 tablet (25 mg total) by mouth daily. 14 tablet 0   Multiple Vitamins-Minerals (ONE-A-DAY MENOPAUSE FORMULA) TABS Take 1 tablet by mouth daily.     naproxen sodium (ALEVE) 220 MG tablet Take 1 tablet (220 mg total) by mouth 2 (two) times daily as needed. (Patient taking differently: Take 440 mg by mouth daily as needed.)     nystatin (MYCOSTATIN) 100000 UNIT/ML suspension TAKE 5 ML BY MOUTH  4 TIMES DAILY FOR 10 DAYS (Patient not taking: Reported on 04/16/2023) 200 mL 0   ofloxacin (FLOXIN) 0.3 % OTIC solution Place 5 drops into the right ear 2 (two) times daily. 5 mL 0   OVER THE COUNTER MEDICATION OTC antacid as needed (cannot recall name)     Spacer/Aero-Holding Rudean Curt Use with Symbicort 1 each 1   triamcinolone cream (KENALOG) 0.1 % Apply 1 Application topically 2 (two) times daily as needed. 30 g 0   vitamin B-12 (CYANOCOBALAMIN) 500 MCG tablet Take 500 mcg by mouth daily.     No facility-administered medications  prior to visit.     Review of Systems:   Constitutional: No weight loss or gain,  night sweats, fevers, chills, fatigue, or lassitude. HEENT: No headaches, difficulty swallowing, tooth/dental problems, or sore throat. No sneezing, itching, ear ache, nasal congestion, or post nasal drip CV:  No chest pain, orthopnea, PND, swelling in lower extremities, anasarca, dizziness, palpitations, syncope Resp: +baseline shortness of breath with exertion. No excess mucus or change in color of mucus. No productive or non-productive. No hemoptysis. No wheezing.  No chest wall deformity GI:  No heartburn, indigestion, abdominal pain, nausea, vomiting, diarrhea, change in bowel habits, loss of appetite, bloody stools.  GU: No dysuria, change in color of urine, urgency or frequency.   Skin: No rash, lesions, ulcerations MSK:  No joint pain or swelling.   Neuro: No dizziness or lightheadedness.  Psych: No depression or anxiety. Mood stable.   Observations/Objective: Patient is well-developed, well-nourished in no acute distress.  Resting comfortably at home.  No labored breathing.  Speech is clear and coherent with logical content.  Patient is alert and oriented at baseline.   Assessment and Plan: Reactive airway disease Concern for underlying asthma/reactive airway disease with air trapping on previous imaging. Clinical benefit from ICS/LABA but unable to tolerate DPI due to recurrent thrush. Tolerating Symbicort well. Aware of proper technique. Action plan in place.  Previous CXR with noted nodularity per UC provider; unable to visualize. Repeat CXR without any evidence of nodules/masses/active pulmonary disease. She is a never smoker. Possibly infectious/inflammatory.   Patient Instructions  Continue to use CPAP every night, minimum of 4-6 hours a night.  Change equipment every 30 days or as directed by DME. Wash your tubing with warm soap and water daily, hang to dry. Wash humidifier portion weekly.   Be aware of reduced alertness and do not drive or operate heavy machinery if experiencing this or drowsiness.  Exercise encouraged, as tolerated. Notify if persistent daytime sleepiness occurs even with consistent use of CPAP.  Continue Symbicort 2 puffs Twice daily. Brush tongue and rinse mouth afterwards. Use with spacer  Continue Albuterol inhaler 2 puffs every 6 hours as needed for shortness of breath or wheezing. Notify if symptoms persist despite rescue inhaler/neb use.   Follow up with Dr. Vassie [name] or Rhunette Croft, NP in 6 weeks to see how inhaler is working. If symptoms do not improve or worsen, please contact office for sooner follow up or seek emergency care.    OSA on CPAP Excellent compliance per her report. Receives benefit from use. Bring SD card to follow up. Aware of proper care/use. Safe driving practices reviewed. Healthy weight loss.   Enlarged pulmonary artery (HCC) Incidental finding. Awaiting echo - reached out to scheduling regarding this  Dyspnea on exertion Suspect multifactorial related to body habitus, deconditioning, reactive airways, possible PH? See above plan. Encouraged to work on graded exercises   I discussed the assessment and treatment plan with the patient. The patient was provided an opportunity to ask questions and all were answered. The patient agreed with the plan and demonstrated an understanding of the instructions.   The patient was advised to call back or seek an in-person evaluation if the symptoms worsen or if the condition fails to improve as anticipated.  I provided 22 minutes of non-face-to-face time during this encounter.   Noemi Chapel, NP

## 2023-05-29 NOTE — Assessment & Plan Note (Signed)
Excellent compliance per her report. Receives benefit from use. Bring SD card to follow up. Aware of proper care/use. Safe driving practices reviewed. Healthy weight loss.

## 2023-05-29 NOTE — Assessment & Plan Note (Signed)
Incidental finding. Awaiting echo - reached out to scheduling regarding this

## 2023-06-06 ENCOUNTER — Encounter (HOSPITAL_BASED_OUTPATIENT_CLINIC_OR_DEPARTMENT_OTHER): Payer: 59 | Admitting: General Surgery

## 2023-06-06 DIAGNOSIS — L97322 Non-pressure chronic ulcer of left ankle with fat layer exposed: Secondary | ICD-10-CM | POA: Diagnosis not present

## 2023-06-12 ENCOUNTER — Encounter (HOSPITAL_BASED_OUTPATIENT_CLINIC_OR_DEPARTMENT_OTHER): Payer: 59 | Attending: General Surgery | Admitting: Internal Medicine

## 2023-06-12 DIAGNOSIS — I890 Lymphedema, not elsewhere classified: Secondary | ICD-10-CM | POA: Diagnosis not present

## 2023-06-12 DIAGNOSIS — I872 Venous insufficiency (chronic) (peripheral): Secondary | ICD-10-CM | POA: Insufficient documentation

## 2023-06-12 DIAGNOSIS — L97322 Non-pressure chronic ulcer of left ankle with fat layer exposed: Secondary | ICD-10-CM | POA: Diagnosis present

## 2023-06-18 ENCOUNTER — Ambulatory Visit (HOSPITAL_COMMUNITY): Payer: 59 | Attending: Cardiology

## 2023-06-18 DIAGNOSIS — I288 Other diseases of pulmonary vessels: Secondary | ICD-10-CM | POA: Diagnosis not present

## 2023-06-18 DIAGNOSIS — R0609 Other forms of dyspnea: Secondary | ICD-10-CM | POA: Insufficient documentation

## 2023-06-18 LAB — ECHOCARDIOGRAM COMPLETE
Area-P 1/2: 3.6 cm2
S' Lateral: 3.2 cm

## 2023-06-18 MED ORDER — PERFLUTREN LIPID MICROSPHERE
1.0000 mL | INTRAVENOUS | Status: AC | PRN
  Administered 2023-06-18: 2 mL via INTRAVENOUS

## 2023-06-19 ENCOUNTER — Encounter (HOSPITAL_BASED_OUTPATIENT_CLINIC_OR_DEPARTMENT_OTHER): Payer: 59 | Admitting: Internal Medicine

## 2023-06-19 DIAGNOSIS — L97322 Non-pressure chronic ulcer of left ankle with fat layer exposed: Secondary | ICD-10-CM | POA: Diagnosis not present

## 2023-06-26 ENCOUNTER — Encounter (HOSPITAL_BASED_OUTPATIENT_CLINIC_OR_DEPARTMENT_OTHER): Payer: 59 | Admitting: General Surgery

## 2023-06-26 DIAGNOSIS — L97322 Non-pressure chronic ulcer of left ankle with fat layer exposed: Secondary | ICD-10-CM | POA: Diagnosis not present

## 2023-07-03 ENCOUNTER — Encounter (HOSPITAL_BASED_OUTPATIENT_CLINIC_OR_DEPARTMENT_OTHER): Payer: 59 | Admitting: General Surgery

## 2023-07-03 DIAGNOSIS — L97322 Non-pressure chronic ulcer of left ankle with fat layer exposed: Secondary | ICD-10-CM | POA: Diagnosis not present

## 2023-07-04 ENCOUNTER — Other Ambulatory Visit: Payer: Self-pay | Admitting: Family Medicine

## 2023-07-04 DIAGNOSIS — M810 Age-related osteoporosis without current pathological fracture: Secondary | ICD-10-CM

## 2023-07-10 ENCOUNTER — Encounter (HOSPITAL_BASED_OUTPATIENT_CLINIC_OR_DEPARTMENT_OTHER): Attending: General Surgery | Admitting: General Surgery

## 2023-07-10 ENCOUNTER — Telehealth: Payer: Self-pay | Admitting: *Deleted

## 2023-07-10 DIAGNOSIS — L97812 Non-pressure chronic ulcer of other part of right lower leg with fat layer exposed: Secondary | ICD-10-CM | POA: Insufficient documentation

## 2023-07-10 DIAGNOSIS — Z6841 Body Mass Index (BMI) 40.0 and over, adult: Secondary | ICD-10-CM | POA: Insufficient documentation

## 2023-07-10 DIAGNOSIS — I890 Lymphedema, not elsewhere classified: Secondary | ICD-10-CM | POA: Diagnosis not present

## 2023-07-10 DIAGNOSIS — L97322 Non-pressure chronic ulcer of left ankle with fat layer exposed: Secondary | ICD-10-CM | POA: Insufficient documentation

## 2023-07-10 DIAGNOSIS — I10 Essential (primary) hypertension: Secondary | ICD-10-CM | POA: Diagnosis not present

## 2023-07-10 DIAGNOSIS — I872 Venous insufficiency (chronic) (peripheral): Secondary | ICD-10-CM | POA: Diagnosis not present

## 2023-07-10 NOTE — Telephone Encounter (Signed)
 Copied from CRM [id number]. Topic: General - Other >> Jul 10, 2023 10:40 AM Almira Coaster wrote: Reason for CRM: Tyisha Nurse case manager with [employer] [employer] is calling to provide contact information to the office for any future needs the office may have [phone].

## 2023-07-17 ENCOUNTER — Encounter (HOSPITAL_BASED_OUTPATIENT_CLINIC_OR_DEPARTMENT_OTHER): Admitting: General Surgery

## 2023-07-17 DIAGNOSIS — L97322 Non-pressure chronic ulcer of left ankle with fat layer exposed: Secondary | ICD-10-CM | POA: Diagnosis not present

## 2023-07-24 ENCOUNTER — Encounter (HOSPITAL_BASED_OUTPATIENT_CLINIC_OR_DEPARTMENT_OTHER): Admitting: General Surgery

## 2023-07-24 DIAGNOSIS — L97322 Non-pressure chronic ulcer of left ankle with fat layer exposed: Secondary | ICD-10-CM | POA: Diagnosis not present

## 2023-07-31 ENCOUNTER — Encounter (HOSPITAL_BASED_OUTPATIENT_CLINIC_OR_DEPARTMENT_OTHER): Admitting: General Surgery

## 2023-07-31 DIAGNOSIS — L97322 Non-pressure chronic ulcer of left ankle with fat layer exposed: Secondary | ICD-10-CM | POA: Diagnosis not present

## 2023-08-06 ENCOUNTER — Encounter (HOSPITAL_BASED_OUTPATIENT_CLINIC_OR_DEPARTMENT_OTHER): Admitting: General Surgery

## 2023-08-06 DIAGNOSIS — L97322 Non-pressure chronic ulcer of left ankle with fat layer exposed: Secondary | ICD-10-CM | POA: Diagnosis not present

## 2023-08-13 ENCOUNTER — Encounter (HOSPITAL_BASED_OUTPATIENT_CLINIC_OR_DEPARTMENT_OTHER): Attending: General Surgery | Admitting: General Surgery

## 2023-08-13 DIAGNOSIS — I890 Lymphedema, not elsewhere classified: Secondary | ICD-10-CM | POA: Insufficient documentation

## 2023-08-13 DIAGNOSIS — L97322 Non-pressure chronic ulcer of left ankle with fat layer exposed: Secondary | ICD-10-CM | POA: Diagnosis present

## 2023-08-13 DIAGNOSIS — Z6841 Body Mass Index (BMI) 40.0 and over, adult: Secondary | ICD-10-CM | POA: Diagnosis not present

## 2023-08-13 DIAGNOSIS — I872 Venous insufficiency (chronic) (peripheral): Secondary | ICD-10-CM | POA: Diagnosis not present

## 2023-08-13 DIAGNOSIS — L97812 Non-pressure chronic ulcer of other part of right lower leg with fat layer exposed: Secondary | ICD-10-CM | POA: Insufficient documentation

## 2023-08-20 ENCOUNTER — Encounter (HOSPITAL_BASED_OUTPATIENT_CLINIC_OR_DEPARTMENT_OTHER): Admitting: Internal Medicine

## 2023-08-20 DIAGNOSIS — L97322 Non-pressure chronic ulcer of left ankle with fat layer exposed: Secondary | ICD-10-CM | POA: Diagnosis not present

## 2023-08-27 ENCOUNTER — Encounter (HOSPITAL_BASED_OUTPATIENT_CLINIC_OR_DEPARTMENT_OTHER): Admitting: General Surgery

## 2023-08-27 DIAGNOSIS — L97322 Non-pressure chronic ulcer of left ankle with fat layer exposed: Secondary | ICD-10-CM | POA: Diagnosis not present

## 2023-08-28 ENCOUNTER — Other Ambulatory Visit: Payer: Self-pay | Admitting: Family Medicine

## 2023-08-28 DIAGNOSIS — M545 Low back pain, unspecified: Secondary | ICD-10-CM

## 2023-08-29 ENCOUNTER — Other Ambulatory Visit: Payer: Self-pay | Admitting: Family Medicine

## 2023-08-29 DIAGNOSIS — I10 Essential (primary) hypertension: Secondary | ICD-10-CM

## 2023-09-02 ENCOUNTER — Ambulatory Visit (INDEPENDENT_AMBULATORY_CARE_PROVIDER_SITE_OTHER): Payer: BC Managed Care – PPO | Admitting: Family Medicine

## 2023-09-02 ENCOUNTER — Encounter: Payer: Self-pay | Admitting: Family Medicine

## 2023-09-02 VITALS — BP 138/84 | HR 85 | Temp 98.1°F | Ht 61.0 in | Wt 270.3 lb

## 2023-09-02 DIAGNOSIS — I10 Essential (primary) hypertension: Secondary | ICD-10-CM

## 2023-09-02 DIAGNOSIS — Z1211 Encounter for screening for malignant neoplasm of colon: Secondary | ICD-10-CM

## 2023-09-02 DIAGNOSIS — R600 Localized edema: Secondary | ICD-10-CM | POA: Diagnosis not present

## 2023-09-02 DIAGNOSIS — E039 Hypothyroidism, unspecified: Secondary | ICD-10-CM

## 2023-09-02 DIAGNOSIS — M810 Age-related osteoporosis without current pathological fracture: Secondary | ICD-10-CM | POA: Diagnosis not present

## 2023-09-02 DIAGNOSIS — Z1231 Encounter for screening mammogram for malignant neoplasm of breast: Secondary | ICD-10-CM | POA: Diagnosis not present

## 2023-09-02 DIAGNOSIS — M545 Low back pain, unspecified: Secondary | ICD-10-CM

## 2023-09-02 MED ORDER — LEVOTHYROXINE SODIUM 175 MCG PO TABS: ORAL_TABLET | ORAL | 1 refills | Status: DC

## 2023-09-02 MED ORDER — LIDOCAINE 5 % EX OINT: 1.0000 | TOPICAL_OINTMENT | CUTANEOUS | 0 refills | Status: DC | PRN

## 2023-09-02 MED ORDER — LOSARTAN POTASSIUM 100 MG PO TABS: 100.0000 mg | ORAL_TABLET | Freq: Every day | ORAL | 0 refills | Status: DC

## 2023-09-02 MED ORDER — ALENDRONATE SODIUM 70 MG PO TABS: ORAL_TABLET | ORAL | 1 refills | Status: DC

## 2023-09-02 MED ORDER — FUROSEMIDE 20 MG PO TABS: 20.0000 mg | ORAL_TABLET | Freq: Every day | ORAL | 1 refills | Status: DC

## 2023-09-02 MED ORDER — METHYLPREDNISOLONE 4 MG PO TBPK: ORAL_TABLET | ORAL | 0 refills | Status: DC

## 2023-09-02 MED ORDER — HYDROCHLOROTHIAZIDE 50 MG PO TABS: 50.0000 mg | ORAL_TABLET | Freq: Every day | ORAL | 1 refills | Status: DC

## 2023-09-02 MED ORDER — CYCLOBENZAPRINE HCL 10 MG PO TABS: 5.0000 mg | ORAL_TABLET | Freq: Three times a day (TID) | ORAL | 0 refills | Status: DC | PRN

## 2023-09-02 NOTE — Progress Notes (Signed)
 Established Patient Office Visit  Subjective   Patient ID: [name], female    DOB: [birth year]  Age: 63 y.o. MRN: [id number]  Chief Complaint  Patient presents with   Medical Management of Chronic Issues   Back Pain    Patient complains of right buttock pain and posterior thigh pain x3 weeks, no known injury and tried heat with no relief    Pt is here for follow up on chronic medical issues and also a new symptom of back pain.  Lymphedema-- pt reports that she is back in her [employer] boots and seeing the wound care specialists, states she was doing ok for a while but then found a new ulcer on her leg and was sent back there. Continues on daily furosemide  for treatment as well.   HTN -- BP in office performed and is well controlled. She  reports no side effects to the medications, no chest pain, SOB, dizziness or headaches. She has a BP cuff at home and is checking BP regularly, reports they are in the normal range.   I also spent time reviewing her health maintenance, she is due for repeat DEXA as well as colonoscopy and mammogram, these were discussed with patient and she is agreeable to the testing.   Back Pain This is a new problem. The current episode started 1 to 4 weeks ago. The problem occurs constantly. The problem is unchanged. The pain is present in the gluteal and sacro-iliac. The quality of the pain is described as shooting and stabbing. The symptoms are aggravated by bending, sitting, standing and position. Pertinent negatives include no numbness, paresis or paresthesias. Risk factors include sedentary lifestyle. She has tried heat for the symptoms. The treatment provided no relief.    Current Outpatient Medications  Medication Instructions   acetaminophen (TYLENOL) 500 mg, As needed   alendronate  (FOSAMAX ) 70 MG tablet Take with a full glass of water on an empty stomach.TAKE 1 TABLET BY MOUTH ONCE A WEEK TAKE  WITH  A  FULL  GLASS  OF  WATER  ON  AN  EMPTY  STOMACH    budesonide -formoterol  (SYMBICORT ) 80-4.5 MCG/ACT inhaler 2 puffs, Inhalation, 2 times daily   Calcium  Carb-Cholecalciferol (CALCIUM  600 + D) 600-200 MG-UNIT TABS 1 tablet, Oral, Daily   cyclobenzaprine (FLEXERIL) 5-10 mg, Oral, 3 times daily PRN   diphenhydrAMINE (BENADRYL) 25 mg, As needed   diphenhydramine-acetaminophen (TYLENOL PM) 25-500 MG TABS tablet 1 tablet, At bedtime PRN   ELDERBERRY PO Take by mouth.   famotidine (PEPCID) 20 mg, Daily PRN   Ferrous Sulfate  Dried (FERROUS SULFATE  IRON ) 200 (65 Fe) MG TABS 1 tablet, Oral, Daily   furosemide  (LASIX ) 20 mg, Oral, Daily   hydrochlorothiazide  (HYDRODIURIL ) 50 mg, Oral, Daily   levothyroxine  (SYNTHROID ) 175 MCG tablet TAKE 1 TABLET BY MOUTH ONCE DAILY BEFORE BREAKFAST.   lidocaine  (XYLOCAINE ) 5 % ointment 1 Application, Topical, As needed   losartan  (COZAAR ) 100 mg, Oral, Daily   methylPREDNISolone  (MEDROL  DOSEPAK) 4 MG TBPK tablet Take package as directed   mirabegron  ER (MYRBETRIQ ) 25 mg, Oral, Daily   Multiple Vitamins-Minerals (ONE-A-DAY MENOPAUSE FORMULA) TABS 1 tablet, Oral, Daily   naproxen  sodium (ALEVE ) 220 mg, Oral, 2 times daily PRN   OVER THE COUNTER MEDICATION OTC antacid as needed (cannot recall name)   Spacer/Aero-Holding Chambers DEVI Use with Symbicort    vitamin B-12 (CYANOCOBALAMIN) 500 mcg, Daily    Patient Active Problem List   Diagnosis Date Noted   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  Neurological:  Negative for numbness and paresthesias.  All other systems reviewed and are negative.     Objective:     BP 138/84   Pulse 85   Temp 98.1 F (36.7 C) (Other (Comment))   Ht 5\' 1"  (1.549 m)   Wt 270 lb 4.8 oz (122.6 kg)   SpO2 98%   BMI 51.07 kg/m     Physical Exam Vitals reviewed.  Constitutional:      Appearance: Normal appearance. She is well-groomed. She is morbidly obese.  Neck:     Thyroid : No thyromegaly.  Cardiovascular:     Rate and Rhythm: Normal rate and regular rhythm.     Pulses: Normal pulses.     Heart sounds: S1 normal and S2 normal.  Pulmonary:     Effort: Pulmonary effort is normal.     Breath sounds: Normal breath sounds and air entry.  Neurological:     Mental Status: She is alert and oriented to person, place, and time. Mental status is at baseline.     Gait: Gait is intact.  Psychiatric:        Mood and Affect: Mood and affect normal.        Speech: Speech normal.        Behavior: Behavior normal.        Judgment: Judgment normal.      No results found for any visits on 09/02/23.    The ASCVD Risk score (Arnett DK, et al., 2019) failed to calculate for the following reasons:   Unable to determine if patient is Non-Hispanic African American    Assessment & Plan:  Breast cancer screening by mammogram -     3D Screening Mammogram, Left and Right; Future  Age-related osteoporosis without current pathological fracture Assessment & Plan: Have been on fosamax  for 2 years, will recheck DEXA this year, continue fosamax  weekly as prescribed  Orders: -     Alendronate  Sodium; Take with a full glass of water on an empty stomach.TAKE 1 TABLET BY MOUTH ONCE A WEEK TAKE  WITH  A  FULL  GLASS  OF  WATER  ON  AN  EMPTY  STOMACH  Dispense: 12 tablet; Refill: 1 -     DG Bone Density; Future  Lower extremity edema -     Furosemide ; Take 1 tablet (20 mg total) by mouth daily.  Dispense: 90 tablet; Refill: 1  Hypertension, unspecified type Assessment & Plan: Current hypertension medications:       Sig   furosemide  (LASIX ) 20 MG tablet Take 1 tablet (20 mg total) by mouth daily.   hydrochlorothiazide  (HYDRODIURIL ) 50 MG tablet Take 1 tablet (50 mg total) by mouth daily.   losartan  (COZAAR ) 100 MG tablet  Take 1 tablet (100 mg total) by mouth daily.      BP is well controlled on the above medications, will continue these as prescribed.  Orders: -     hydroCHLOROthiazide ; Take 1 tablet (50 mg total) by mouth daily.  Dispense: 90 tablet; Refill: 1 -     Losartan  Potassium; Take 1 tablet (100 mg total) by mouth daily.  Dispense: 90 tablet; Refill: 0  Hypothyroidism, unspecified type Assessment & Plan: Symptoms stable on 175  mcg dosing, reviewed last TSH which was WNL, continue as prescribed  Orders: -     Levothyroxine  Sodium; TAKE 1 TABLET BY MOUTH ONCE DAILY BEFORE BREAKFAST.  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 If no improvement then she will need repeat lumbar films to look for worsening of her DDD.  -     Lidocaine ; Apply 1 Application topically as needed.  Dispense: 36 g; Refill: 0 -     methylPREDNISolone ; Take package as directed  Dispense: 21 each; Refill: 0 -     Cyclobenzaprine HCl; Take 0.5-1 tablets (5-10 mg total) by mouth 3 (three) times daily as needed for muscle spasms.  Dispense: 30 tablet; Refill: 0  Colon cancer screening -     Ambulatory referral to Gastroenterology     Return in about 6 months (around 03/04/2024) for annual physical exam.    Aida House, MD

## 2023-09-02 NOTE — Assessment & Plan Note (Signed)
 Have been on fosamax  for 2 years, will recheck DEXA this year, continue fosamax  weekly as prescribed

## 2023-09-02 NOTE — Assessment & Plan Note (Signed)
 Current hypertension medications:       Sig   furosemide  (LASIX ) 20 MG tablet Take 1 tablet (20 mg total) by mouth daily.   hydrochlorothiazide  (HYDRODIURIL ) 50 MG tablet Take 1 tablet (50 mg total) by mouth daily.   losartan  (COZAAR ) 100 MG tablet Take 1 tablet (100 mg total) by mouth daily.      BP is well controlled on the above medications, will continue these as prescribed.

## 2023-09-02 NOTE — Assessment & Plan Note (Signed)
 Symptoms stable on 175 mcg dosing, reviewed last TSH which was WNL, continue as prescribed

## 2023-09-03 ENCOUNTER — Encounter (HOSPITAL_BASED_OUTPATIENT_CLINIC_OR_DEPARTMENT_OTHER): Admitting: General Surgery

## 2023-09-03 DIAGNOSIS — L97322 Non-pressure chronic ulcer of left ankle with fat layer exposed: Secondary | ICD-10-CM | POA: Diagnosis not present

## 2023-09-11 ENCOUNTER — Encounter (HOSPITAL_BASED_OUTPATIENT_CLINIC_OR_DEPARTMENT_OTHER): Attending: General Surgery | Admitting: General Surgery

## 2023-09-11 DIAGNOSIS — L97322 Non-pressure chronic ulcer of left ankle with fat layer exposed: Secondary | ICD-10-CM | POA: Diagnosis present

## 2023-09-11 DIAGNOSIS — I890 Lymphedema, not elsewhere classified: Secondary | ICD-10-CM | POA: Diagnosis not present

## 2023-09-11 DIAGNOSIS — I872 Venous insufficiency (chronic) (peripheral): Secondary | ICD-10-CM | POA: Insufficient documentation

## 2023-09-11 DIAGNOSIS — L97812 Non-pressure chronic ulcer of other part of right lower leg with fat layer exposed: Secondary | ICD-10-CM | POA: Diagnosis not present

## 2023-09-18 ENCOUNTER — Encounter (HOSPITAL_BASED_OUTPATIENT_CLINIC_OR_DEPARTMENT_OTHER): Admitting: General Surgery

## 2023-09-18 DIAGNOSIS — L97322 Non-pressure chronic ulcer of left ankle with fat layer exposed: Secondary | ICD-10-CM | POA: Diagnosis not present

## 2023-09-22 ENCOUNTER — Ambulatory Visit
Admission: RE | Admit: 2023-09-22 | Discharge: 2023-09-22 | Disposition: A | Discharge status: Home / Self Care | Source: Ambulatory Visit | Attending: Family Medicine | Admitting: Family Medicine

## 2023-09-22 DIAGNOSIS — Z1231 Encounter for screening mammogram for malignant neoplasm of breast: Secondary | ICD-10-CM

## 2023-09-24 ENCOUNTER — Ambulatory Visit: Payer: Self-pay | Admitting: Family Medicine

## 2023-09-25 ENCOUNTER — Encounter: Payer: Self-pay | Admitting: Gastroenterology

## 2023-09-26 ENCOUNTER — Encounter (HOSPITAL_BASED_OUTPATIENT_CLINIC_OR_DEPARTMENT_OTHER): Admitting: General Surgery

## 2023-09-26 DIAGNOSIS — L97322 Non-pressure chronic ulcer of left ankle with fat layer exposed: Secondary | ICD-10-CM | POA: Diagnosis not present

## 2023-09-27 ENCOUNTER — Other Ambulatory Visit: Payer: Self-pay | Admitting: Family Medicine

## 2023-09-27 DIAGNOSIS — M545 Low back pain, unspecified: Secondary | ICD-10-CM

## 2023-10-01 ENCOUNTER — Telehealth: Payer: Self-pay | Admitting: Student

## 2023-10-01 NOTE — Telephone Encounter (Signed)
 Rc'd [facility] fax for Spap supplies. It has been signed and ret to front. I will fax to [phone], verify and send to scan.

## 2023-10-03 ENCOUNTER — Encounter (HOSPITAL_BASED_OUTPATIENT_CLINIC_OR_DEPARTMENT_OTHER): Admitting: General Surgery

## 2023-10-03 DIAGNOSIS — L97322 Non-pressure chronic ulcer of left ankle with fat layer exposed: Secondary | ICD-10-CM | POA: Diagnosis not present

## 2023-10-09 ENCOUNTER — Encounter (HOSPITAL_BASED_OUTPATIENT_CLINIC_OR_DEPARTMENT_OTHER): Attending: General Surgery | Admitting: General Surgery

## 2023-10-09 DIAGNOSIS — I872 Venous insufficiency (chronic) (peripheral): Secondary | ICD-10-CM | POA: Insufficient documentation

## 2023-10-09 DIAGNOSIS — L97812 Non-pressure chronic ulcer of other part of right lower leg with fat layer exposed: Secondary | ICD-10-CM | POA: Diagnosis not present

## 2023-10-09 DIAGNOSIS — L97322 Non-pressure chronic ulcer of left ankle with fat layer exposed: Secondary | ICD-10-CM | POA: Diagnosis present

## 2023-10-09 DIAGNOSIS — I890 Lymphedema, not elsewhere classified: Secondary | ICD-10-CM | POA: Insufficient documentation

## 2023-10-09 NOTE — Telephone Encounter (Signed)
 NFN

## 2023-10-17 ENCOUNTER — Encounter (HOSPITAL_BASED_OUTPATIENT_CLINIC_OR_DEPARTMENT_OTHER): Admitting: General Surgery

## 2023-10-17 DIAGNOSIS — L97322 Non-pressure chronic ulcer of left ankle with fat layer exposed: Secondary | ICD-10-CM | POA: Diagnosis not present

## 2023-10-25 ENCOUNTER — Other Ambulatory Visit: Payer: Self-pay | Admitting: Family Medicine

## 2023-10-25 DIAGNOSIS — M545 Low back pain, unspecified: Secondary | ICD-10-CM

## 2023-10-27 ENCOUNTER — Encounter (HOSPITAL_BASED_OUTPATIENT_CLINIC_OR_DEPARTMENT_OTHER): Admitting: General Surgery

## 2023-10-27 DIAGNOSIS — L97322 Non-pressure chronic ulcer of left ankle with fat layer exposed: Secondary | ICD-10-CM | POA: Diagnosis not present

## 2023-10-29 ENCOUNTER — Telehealth: Payer: Self-pay | Admitting: *Deleted

## 2023-10-29 NOTE — Telephone Encounter (Signed)
 Patient over maximum BMI, will she need OV for a screening colonoscopy?

## 2023-10-30 NOTE — Telephone Encounter (Signed)
 Called patient and left vm to call back so we can go ahead and cancel Pre Visit and Colonoscopy and schedule OV.

## 2023-10-31 ENCOUNTER — Encounter: Payer: Self-pay | Admitting: Gastroenterology

## 2023-10-31 NOTE — Telephone Encounter (Signed)
 Called patient and cancelled those appts and scheduled patient for an ov.

## 2023-10-31 NOTE — Telephone Encounter (Signed)
 Noted

## 2023-11-03 ENCOUNTER — Encounter (HOSPITAL_BASED_OUTPATIENT_CLINIC_OR_DEPARTMENT_OTHER): Admitting: General Surgery

## 2023-11-03 DIAGNOSIS — L97322 Non-pressure chronic ulcer of left ankle with fat layer exposed: Secondary | ICD-10-CM | POA: Diagnosis not present

## 2023-11-05 ENCOUNTER — Ambulatory Visit (HOSPITAL_BASED_OUTPATIENT_CLINIC_OR_DEPARTMENT_OTHER)
Admission: RE | Admit: 2023-11-05 | Discharge: 2023-11-05 | Disposition: A | Discharge status: Home / Self Care | Source: Ambulatory Visit | Attending: Family Medicine | Admitting: Family Medicine

## 2023-11-05 DIAGNOSIS — M810 Age-related osteoporosis without current pathological fracture: Secondary | ICD-10-CM | POA: Diagnosis present

## 2023-11-10 ENCOUNTER — Encounter

## 2023-11-10 ENCOUNTER — Encounter (HOSPITAL_BASED_OUTPATIENT_CLINIC_OR_DEPARTMENT_OTHER): Attending: General Surgery | Admitting: General Surgery

## 2023-11-10 DIAGNOSIS — L97322 Non-pressure chronic ulcer of left ankle with fat layer exposed: Secondary | ICD-10-CM | POA: Diagnosis present

## 2023-11-10 DIAGNOSIS — L97812 Non-pressure chronic ulcer of other part of right lower leg with fat layer exposed: Secondary | ICD-10-CM | POA: Diagnosis not present

## 2023-11-10 DIAGNOSIS — I890 Lymphedema, not elsewhere classified: Secondary | ICD-10-CM | POA: Insufficient documentation

## 2023-11-10 DIAGNOSIS — Z6841 Body Mass Index (BMI) 40.0 and over, adult: Secondary | ICD-10-CM | POA: Diagnosis not present

## 2023-11-10 DIAGNOSIS — I872 Venous insufficiency (chronic) (peripheral): Secondary | ICD-10-CM | POA: Insufficient documentation

## 2023-11-17 ENCOUNTER — Encounter (HOSPITAL_BASED_OUTPATIENT_CLINIC_OR_DEPARTMENT_OTHER): Admitting: General Surgery

## 2023-11-17 DIAGNOSIS — L97322 Non-pressure chronic ulcer of left ankle with fat layer exposed: Secondary | ICD-10-CM | POA: Diagnosis not present

## 2023-11-24 ENCOUNTER — Encounter: Admitting: Gastroenterology

## 2023-11-26 ENCOUNTER — Encounter (HOSPITAL_BASED_OUTPATIENT_CLINIC_OR_DEPARTMENT_OTHER): Admitting: General Surgery

## 2023-11-26 DIAGNOSIS — L97322 Non-pressure chronic ulcer of left ankle with fat layer exposed: Secondary | ICD-10-CM | POA: Diagnosis not present

## 2023-12-04 ENCOUNTER — Encounter (HOSPITAL_BASED_OUTPATIENT_CLINIC_OR_DEPARTMENT_OTHER): Admitting: General Surgery

## 2023-12-04 ENCOUNTER — Encounter: Admitting: Gastroenterology

## 2023-12-04 DIAGNOSIS — L97322 Non-pressure chronic ulcer of left ankle with fat layer exposed: Secondary | ICD-10-CM | POA: Diagnosis not present

## 2023-12-15 ENCOUNTER — Telehealth: Payer: Self-pay

## 2023-12-15 ENCOUNTER — Encounter (HOSPITAL_BASED_OUTPATIENT_CLINIC_OR_DEPARTMENT_OTHER): Attending: General Surgery | Admitting: General Surgery

## 2023-12-15 DIAGNOSIS — I872 Venous insufficiency (chronic) (peripheral): Secondary | ICD-10-CM | POA: Diagnosis not present

## 2023-12-15 DIAGNOSIS — L97322 Non-pressure chronic ulcer of left ankle with fat layer exposed: Secondary | ICD-10-CM | POA: Insufficient documentation

## 2023-12-15 DIAGNOSIS — I890 Lymphedema, not elsewhere classified: Secondary | ICD-10-CM | POA: Diagnosis not present

## 2023-12-15 DIAGNOSIS — L97812 Non-pressure chronic ulcer of other part of right lower leg with fat layer exposed: Secondary | ICD-10-CM | POA: Insufficient documentation

## 2023-12-15 NOTE — Telephone Encounter (Signed)
 Left message for patient to return call.  Need to make patient aware that appointment on 01-30-24 at 3:20pm was moved up to 10:20am per Dr Rennis request. Will continue efforts to reach patient.

## 2023-12-15 NOTE — Telephone Encounter (Signed)
 Inbound call from pt requesting to speak to [place]. I tried to advise the patient of her appointment but patient insisted on speak with Nat only. Please advise.

## 2023-12-15 NOTE — Telephone Encounter (Signed)
 Left message for patient to return call.

## 2023-12-16 NOTE — Telephone Encounter (Signed)
 Left message for patient to return call.

## 2023-12-17 NOTE — Telephone Encounter (Signed)
 Patient returned call and made aware that she is has been rescheduled on 01-30-24 at 3pm with Alan Coombs, PA-C as Dr San will be at the hospital doing procedures in the morning.  Patient agreed to plan and verbalized understanding.  No further questions.

## 2023-12-19 ENCOUNTER — Other Ambulatory Visit: Payer: Self-pay | Admitting: Family Medicine

## 2023-12-19 DIAGNOSIS — M545 Low back pain, unspecified: Secondary | ICD-10-CM

## 2023-12-24 ENCOUNTER — Encounter (HOSPITAL_BASED_OUTPATIENT_CLINIC_OR_DEPARTMENT_OTHER): Admitting: General Surgery

## 2023-12-24 DIAGNOSIS — L97812 Non-pressure chronic ulcer of other part of right lower leg with fat layer exposed: Secondary | ICD-10-CM | POA: Diagnosis not present

## 2023-12-25 ENCOUNTER — Other Ambulatory Visit: Payer: Self-pay | Admitting: Nurse Practitioner

## 2023-12-25 DIAGNOSIS — G4733 Obstructive sleep apnea (adult) (pediatric): Secondary | ICD-10-CM

## 2023-12-31 ENCOUNTER — Encounter (HOSPITAL_BASED_OUTPATIENT_CLINIC_OR_DEPARTMENT_OTHER): Admitting: General Surgery

## 2023-12-31 DIAGNOSIS — L97812 Non-pressure chronic ulcer of other part of right lower leg with fat layer exposed: Secondary | ICD-10-CM | POA: Diagnosis not present

## 2024-01-07 ENCOUNTER — Encounter (HOSPITAL_BASED_OUTPATIENT_CLINIC_OR_DEPARTMENT_OTHER): Admitting: General Surgery

## 2024-01-08 ENCOUNTER — Encounter: Payer: Self-pay | Admitting: Family Medicine

## 2024-01-08 ENCOUNTER — Ambulatory Visit: Admitting: Family Medicine

## 2024-01-08 ENCOUNTER — Encounter (HOSPITAL_BASED_OUTPATIENT_CLINIC_OR_DEPARTMENT_OTHER): Attending: General Surgery | Admitting: Internal Medicine

## 2024-01-08 VITALS — BP 138/84 | HR 90 | Temp 97.9°F | Ht 61.0 in | Wt 276.0 lb

## 2024-01-08 DIAGNOSIS — L97812 Non-pressure chronic ulcer of other part of right lower leg with fat layer exposed: Secondary | ICD-10-CM | POA: Insufficient documentation

## 2024-01-08 DIAGNOSIS — I872 Venous insufficiency (chronic) (peripheral): Secondary | ICD-10-CM | POA: Insufficient documentation

## 2024-01-08 DIAGNOSIS — I890 Lymphedema, not elsewhere classified: Secondary | ICD-10-CM | POA: Diagnosis not present

## 2024-01-08 DIAGNOSIS — B029 Zoster without complications: Secondary | ICD-10-CM

## 2024-01-08 DIAGNOSIS — B084 Enteroviral vesicular stomatitis with exanthem: Secondary | ICD-10-CM | POA: Diagnosis not present

## 2024-01-08 MED ORDER — VALACYCLOVIR HCL 1 G PO TABS: 1000.0000 mg | ORAL_TABLET | Freq: Three times a day (TID) | ORAL | 0 refills | Status: AC

## 2024-01-08 NOTE — Progress Notes (Signed)
 Established Patient Office Visit   Subjective  Patient ID: [name], female    DOB: 1961/01/11  Age: 63 y.o. 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  Pt noticed a pruritic rash on upper R back over the wknd.  Pt had her husband look at it but he was not clear of if any blisters were present.  Pt has since developed a pruritic, burning vesicular rash on R palm x 1 day.  Hand has a pins and needles like sensation.  Pt tried lotion on areas.  She has also tried not to use her hand but it is difficult as she is R handed.  Pt has had shingles vaccine.  Pt works in the front office of a local school and notes recent outbreak of hand, foot, and mouth dz.  No bumps noted in mouth, soles of feet.  Denies fever, chills, ST, cold like sx.  Followed by wound care for open sores of b/l feet.    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   Reactive airway disease 04/17/2023   Osteoporosis 02/20/2022   Dyspnea on exertion 01/12/2019   Hypertension 11/27/2018   Hypothyroid 11/27/2018   OSA on CPAP 11/27/2018   Past Medical History:  Diagnosis Date   Basal cell carcinoma (BCC) in situ of skin    Hypertension    Hypothyroid    Nephrolithiasis    OSA (obstructive sleep apnea)    Past Surgical History:  Procedure Laterality Date   CHOLECYSTECTOMY     ELBOW FRACTURE SURGERY     LITHOTRIPSY Left 1995   SCLEROTHERAPY     Social History   Tobacco Use   Smoking status: Never   Smokeless tobacco: Never  Substance Use Topics   Alcohol use: Never   Drug use: Never   Family History  Problem Relation Age of  Onset   Hypertension Mother    Lymphoma Mother    Squamous cell carcinoma Father    COPD Father    Asthma Father    Pancreatic cancer Father    Diabetes Mellitus I Sister    Heart disease Maternal Grandmother    Heart disease Maternal Grandfather    Thyroid  disease Paternal Grandmother    Alzheimer's disease Paternal Grandmother    Heart disease Paternal Grandfather    Allergies  Allergen Reactions   Ivp Dye [Iodinated Contrast Media]     Hives, hot   Sulfa Antibiotics     Hives, hot flashes    ROS Negative unless stated above    Objective:     BP 138/84 (BP Location: Left Arm, Patient Position: Sitting, Cuff Size: Large)   Pulse 90   Temp 97.9 F (36.6 C) (Oral)   Ht 5' 1 (1.549 m)   Wt 276 lb (125.2 kg)   SpO2 99%   BMI 52.15 kg/m  BP Readings from Last 3 Encounters:  01/08/24 138/84  09/02/23 138/84  05/12/23 134/80   Wt Readings from Last 3 Encounters:  01/08/24 276 lb (125.2 kg)  09/02/23 270 lb 4.8 oz (122.6 kg)  05/12/23 271 lb 4.8 oz (123.1 kg)      Physical Exam Constitutional:      General: She is not in acute distress.    Appearance: Normal appearance.  HENT:     Head: Normocephalic and atraumatic.     Nose: Nose normal.     Mouth/Throat:     Mouth: Mucous membranes are moist.     Comments: No oral lesions, edema, or erythema. Cardiovascular:     Rate and Rhythm: Normal rate and regular rhythm.     Heart sounds: Normal heart sounds. No murmur heard.    No gallop.  Pulmonary:     Effort: Pulmonary effort is normal. No respiratory distress.     Breath sounds: Normal breath sounds. No wheezing, rhonchi or rales.  Skin:    General: Skin is warm and dry.         Comments: A large erythematous flat area on upper R back with small 1-3 mm vesicular lesions.  Lateral R palm with 3-5 mm vesicular lesions with erythema surrounding.  A new erythematous area on R medial forearm present.   Neurological:     Mental Status: She is alert and oriented  to person, place, and time.     No results found for any visits on 01/08/24.    Assessment & Plan:   Herpes zoster without complication -     valACYclovir HCl; Take 1 tablet (1,000 mg total) by mouth 3 (three) times daily for 7 days.  Dispense: 21 tablet; Refill: 0  Hand, foot and mouth disease  Pt with acute dermatitis.  2 different rashes present.  Rash of R upper back consistent with a mild case of shingles.   Symptoms likely mild due to h/o having shingles vaccine. 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  Disucssed r/b/a of starting Valtrex given duration of symptoms.  As pt is without pain declines gabapentin at this time.  Discussed supportive care for symptoms a/w with HFM dz.  Advised on likely duration of symptoms.  Given note for work, advised to remain out until resolution of rashes.  Return if symptoms worsen or fail to improve.   Clotilda JONELLE Single, MD

## 2024-01-14 ENCOUNTER — Encounter (HOSPITAL_BASED_OUTPATIENT_CLINIC_OR_DEPARTMENT_OTHER): Admitting: General Surgery

## 2024-01-14 DIAGNOSIS — L97812 Non-pressure chronic ulcer of other part of right lower leg with fat layer exposed: Secondary | ICD-10-CM | POA: Diagnosis not present

## 2024-01-21 ENCOUNTER — Encounter (HOSPITAL_BASED_OUTPATIENT_CLINIC_OR_DEPARTMENT_OTHER): Admitting: General Surgery

## 2024-01-21 DIAGNOSIS — L97812 Non-pressure chronic ulcer of other part of right lower leg with fat layer exposed: Secondary | ICD-10-CM | POA: Diagnosis not present

## 2024-01-28 ENCOUNTER — Encounter (HOSPITAL_BASED_OUTPATIENT_CLINIC_OR_DEPARTMENT_OTHER): Admitting: General Surgery

## 2024-01-28 DIAGNOSIS — L97812 Non-pressure chronic ulcer of other part of right lower leg with fat layer exposed: Secondary | ICD-10-CM | POA: Diagnosis not present

## 2024-01-30 ENCOUNTER — Ambulatory Visit: Admitting: Physician Assistant

## 2024-01-30 ENCOUNTER — Ambulatory Visit: Admitting: Gastroenterology

## 2024-01-30 ENCOUNTER — Encounter: Payer: Self-pay | Admitting: Physician Assistant

## 2024-01-30 VITALS — BP 118/78 | HR 100 | Ht 61.0 in | Wt 277.2 lb

## 2024-01-30 DIAGNOSIS — G4733 Obstructive sleep apnea (adult) (pediatric): Secondary | ICD-10-CM | POA: Diagnosis not present

## 2024-01-30 DIAGNOSIS — Z1211 Encounter for screening for malignant neoplasm of colon: Secondary | ICD-10-CM

## 2024-01-30 DIAGNOSIS — K219 Gastro-esophageal reflux disease without esophagitis: Secondary | ICD-10-CM

## 2024-01-30 DIAGNOSIS — J4530 Mild persistent asthma, uncomplicated: Secondary | ICD-10-CM | POA: Diagnosis not present

## 2024-01-30 MED ORDER — NA SULFATE-K SULFATE-MG SULF 17.5-3.13-1.6 GM/177ML PO SOLN: 1.0000 | ORAL | 0 refills | Status: DC

## 2024-01-30 NOTE — Patient Instructions (Addendum)
 We have sent the following medications to your pharmacy for you to pick up at your convenience: Suprep   You have been scheduled for a colonoscopy. Please follow written instructions given to you at your visit today.   If you use inhalers (even only as needed), please bring them with you on the day of your procedure.  DO NOT TAKE 7 DAYS PRIOR TO TEST- Trulicity (dulaglutide) Ozempic, Wegovy (semaglutide) Mounjaro (tirzepatide) Bydureon Bcise (exanatide extended release)  DO NOT TAKE 1 DAY PRIOR TO YOUR TEST Rybelsus (semaglutide) Adlyxin (lixisenatide) Victoza (liraglutide) Byetta (exanatide) ___________________________________________________________________________     Silent reflux: Not all heartburn burns...SABRASABRASABRA  What is LPR? Laryngopharyngeal reflux (LPR) or silent reflux is a condition in which acid that is made in the stomach travels up the esophagus (swallowing tube) and gets to the throat. Not everyone with reflux has a lot of heartburn or indigestion. In fact, many people with LPR never have heartburn. This is why LPR is called SILENT REFLUX, and the terms Silent reflux and LPR are often used interchangeably. Because LPR is silent, it is sometimes difficult to diagnose.  How can you tell if you have LPR?  Chronic hoarseness- Some people have hoarseness that comes and goes throat clearing  Cough It can cause shortness of breath and cause asthma like symptoms. a feeling of a lump in the throat  difficulty swallowing a problem with too much nose and throat drainage.  Some people will feel their esophagus spasm which feels like their heart beating hard and fast, this will usually be after a meal, at rest, or lying down at night.    How do I treat this? Treatment for LPR should be individualized, and your doctor will suggest the best treatment for you. Generally there are several treatments for LPR: changing habits and diet to reduce reflux,  medications to reduce  stomach acid, and  surgery to prevent reflux. Most people with LPR need to modify how and when they eat, as well as take some medication, to get well. Sometimes, nonprescription liquid antacids, such as Maalox, Gelucil and Mylanta are recommended. When used, these antacids should be taken four times each day - one tablespoon one hour after each meal and before bedtime. Dietary and lifestyle changes alone are not often enough to control LPR - medications that reduce stomach acid are also usually needed. These must be prescribed by our doctor.   TIPS FOR REDUCING REFLUX AND LPR Control your LIFE-STYLE and your DIET! If you use tobacco, QUIT.  Smoking makes you reflux. After every cigarette you have some LPR.  Don't wear clothing that is too tight, especially around the waist (trousers, corsets, belts).  Do not lie down just after eating...in fact, do not eat within three hours of bedtime.  You should be on a low-fat diet.  Limit your intake of red meat.  Limit your intake of butter.  Avoid fried foods.  Avoid chocolate  Avoid cheese.  Avoid eggs. Specifically avoid caffeine (especially coffee and tea), soda pop (especially cola) and mints.  Avoid alcoholic beverages, particularly in the evening.  Reflux Gourmet Rescue  It is an ALGINATE THERAPY which is the only intervention that works to safeguard the esophagus by creating a protective barrier that actually stops reflux from happening. -The general directions for use are as stated on the packaging: Take 1 teaspoon (5 ml), or more as needed or as directed by your physician, after meals and before bed. -These general directions address the most common  times for reflux to occur, but our Rescue products may be taken anytime. Some individuals may take our product preemptively, when they know they will suffer from reflux, or as needed - when discomfort arises. (If taken around food, it should be consumed last.) -You do not have to take 1  teaspoon (5 ml) of the product. While one teaspoon (5ml) may be the perfect average amount to relieve reflux suffering in some, others may require more or less. You may adjust the amount of Mint Chocolate Rescue and Vanilla Caramel Rescue to the lowest amount necessary to meet your individual needs to improve your quality of life. -You may dilute the product if it is too viscous for you to consume. Keep in mind, however, that the thickness of the product was formulated to provide optimal coating and protection of your throat and esophagus. Though diluting the product is possible, it may reduce the protective function and/or length of action. -This can be used in conjunction with reflux medications and lifestyle changes.  100% ALL-NATURAL  Paraben FREE, glycerin FREE, & potassium FREE  Made entirely from all-natural ingredients considered safe for children and during pregnancy  No known side effects  All-natural flavor Gluten FREE  Allergen FREE  Vegan  Can find more information here: NameSeizer.co.nz  Due to recent changes in healthcare laws, you may see the results of your imaging and laboratory studies on MyChart before your provider has had a chance to review them.  We understand that in some cases there may be results that are confusing or concerning to you. Not all laboratory results come back in the same time frame and the provider may be waiting for multiple results in order to interpret others.  Please give us  48 hours in order for your provider to thoroughly review all the results before contacting the office for clarification of your results.   _______________________________________________________  If your blood pressure at your visit was 140/90 or greater, please contact your primary care physician to follow up on this.  _______________________________________________________  If you are age 65 or older, your body mass index should be between  23-30. Your Body mass index is 52.39 kg/m. If this is out of the aforementioned range listed, please consider follow up with your Primary Care Provider.  If you are age 37 or younger, your body mass index should be between 19-25. Your Body mass index is 52.39 kg/m. If this is out of the aformentioned range listed, please consider follow up with your Primary Care Provider.   ________________________________________________________  The [facility] providers would like to encourage you to use MYCHART to communicate with providers for non-urgent requests or questions.  Due to long hold times on the telephone, sending your provider a message by [facility] may be a faster and more efficient way to get a response.  Please allow 48 business hours for a response.  Please remember that this is for non-urgent requests.  _______________________________________________________  [facility] is using a team-based approach to care.  Your team is made up of your doctor and two to three APPS. Our APPS (Nurse Practitioners and Physician Assistants) work with your physician to ensure care continuity for you. They are fully qualified to address your health concerns and develop a treatment plan. They communicate directly with your gastroenterologist to care for you. Seeing the Advanced Practice Practitioners on your physician's team can help you by facilitating care more promptly, often allowing for earlier appointments, access to diagnostic testing, procedures, and other specialty referrals.  Thank you for choosing me and [facility].  Alan Coombs, PA-C

## 2024-01-30 NOTE — Progress Notes (Addendum)
 01/30/2024 [name] [id number] [birth year]  Referring provider: Ozell Heron HERO, MD Primary GI doctor: Dr. Suzann  ASSESSMENT AND PLAN:  Screening colonoscopy 01/2009 or 2015 colonoscopy in [place], recall 10 years No family history of colon cancer, no changes in bowel habits, no hematochezia Colonoscopy at the hospital due to her BMI, We have discussed the risks of bleeding, infection, perforation, medication reactions, and remote risk of death associated with colonoscopy. All questions were answered and the patient acknowledges these risk and wishes to proceed.  GERD/ possible LPR Rare GERD, no dysphagia, no melena Aleve  2 in the morning for knee pain for 2 years -alginate therapy given -Lifestyle changes discussed, avoid NSAIDS, ETOH, hand out given to the patient -Weight loss discussed with the patient - call if any worsening symptoms  DOE/OSA 04/17/2023 Echo EF 55-60%, normal RVSP, no AS PFT unremarkable, On inhaler which is helping Consider stopping aleve  and trial of pepcid for possible LPR  Morbid obesity  Body mass index is 52.39 kg/m.  -Patient has been advised to make an attempt to improve diet and exercise patterns to aid in weight loss. -Recommended diet heavy in fruits and veggies and low in animal meats, cheeses, and dairy products, appropriate calorie intake  I have reviewed the clinic note as outlined by Alan Coombs, PA and agree with the assessment, plan and medical decision making.  [name] presents to the office today for discussion regarding screening colonoscopy.  Last colonoscopy in 2015 or 2019 in [place]  and reportedly normal.  No change in bowel habits.  No family history of colorectal cancer.  BMI is greater than 50.  Agree with proceeding with colonoscopy at hospital.  [name], MD   Patient Care Team: Ozell Heron HERO, MD as PCP - General (Family Medicine)  HISTORY OF PRESENT ILLNESS: 63 y.o. female with a past  medical history listed below presents for evaluation of screening colonoscopy.   Discussed the use of AI scribe software for clinical note transcription with the patient, who gave verbal consent to proceed.  History of Present Illness   OLIMPIA [name] is a 63 year old female who presents for a possible screening colonoscopy.  She has a history of dyspnea on exertion for which she underwent an echocardiogram in January 2025, showing normal ejection fraction, no aortic stenosis, and normal right ventricular systolic pressure. Pulmonary function tests were unremarkable. The use of an inhaler has been beneficial.  She has a history of hemorrhoids and a body mass index of 52. She experiences rare gastroesophageal reflux disease symptoms without dysphagia. She mentions occasional coughing and throat clearing.  She has been taking Aleve , two pills in the morning, for knee pain over the past two years, which coincides with the onset of her dyspnea on exertion.  No family history of colon cancer and no changes in bowel habits, with daily bowel movements and no hematochezia. Her last colonoscopy was in either 2019 or 2015 in [place] , with a recall period of ten years.      She  reports that she has never smoked. She has never used smokeless tobacco. She reports that she does not drink alcohol and does not use drugs.  RELEVANT GI HISTORY, IMAGING AND LABS: Results   DIAGNOSTIC Echocardiogram: Normal ejection fraction, no aortic stenosis, normal right ventricular systolic pressure (04/2023) Pulmonary function test: Unremarkable      CBC    Component Value Date/Time   WBC 7.0 03/28/2021 1037   RBC 3.80 (L)  03/28/2021 1037   HGB 12.1 03/28/2021 1037   HCT 36.2 03/28/2021 1037   PLT 190.0 03/28/2021 1037   MCV 95.2 03/28/2021 1037   MCHC 33.5 03/28/2021 1037   RDW 13.2 03/28/2021 1037   LYMPHSABS 2.0 03/28/2021 1037   MONOABS 0.5 03/28/2021 1037   EOSABS 0.2 03/28/2021 1037   BASOSABS 0.0  03/28/2021 1037   No results for input(s): HGB in the last 8760 hours.  CMP     Component Value Date/Time   NA 139 03/05/2023 0953   K 3.9 03/05/2023 0953   CL 101 03/05/2023 0953   CO2 29 03/05/2023 0953   GLUCOSE 93 03/05/2023 0953   BUN 23 03/05/2023 0953   CREATININE 1.03 03/05/2023 0953   CALCIUM  9.9 03/05/2023 0953   PROT 8.0 03/05/2023 0953   ALBUMIN 4.4 03/05/2023 0953   AST 30 03/05/2023 0953   ALT 22 03/05/2023 0953   ALKPHOS 74 03/05/2023 0953   BILITOT 0.4 03/05/2023 0953      Latest Ref Rng & Units 03/05/2023    9:53 AM 02/27/2022    7:58 AM 03/28/2021   10:37 AM  Hepatic Function  Total Protein 6.0 - 8.3 g/dL 8.0  7.8  8.1   Albumin 3.5 - 5.2 g/dL 4.4  4.1  4.3   AST 0 - 37 U/L 30  31  31    ALT 0 - 35 U/L 22  21  19    Alk Phosphatase 39 - 117 U/L 74  71  80   Total Bilirubin 0.2 - 1.2 mg/dL 0.4  0.3  0.5       Current Medications:   Current Outpatient Medications (Endocrine & Metabolic):    alendronate  (FOSAMAX ) 70 MG tablet, Take with a full glass of water on an empty stomach.TAKE 1 TABLET BY MOUTH ONCE A WEEK TAKE  WITH  A  FULL  GLASS  OF  WATER  ON  AN  EMPTY  STOMACH   levothyroxine  (SYNTHROID ) 175 MCG tablet, TAKE 1 TABLET BY MOUTH ONCE DAILY BEFORE BREAKFAST.  Current Outpatient Medications (Cardiovascular):    furosemide  (LASIX ) 20 MG tablet, Take 1 tablet (20 mg total) by mouth daily.   hydrochlorothiazide  (HYDRODIURIL ) 50 MG tablet, Take 1 tablet (50 mg total) by mouth daily.   losartan  (COZAAR ) 100 MG tablet, Take 1 tablet (100 mg total) by mouth daily.  Current Outpatient Medications (Respiratory):    budesonide -formoterol  (SYMBICORT ) 80-4.5 MCG/ACT inhaler, Inhale 2 puffs into the lungs in the morning and at bedtime.   diphenhydrAMINE (BENADRYL) 25 MG tablet, Take 25 mg by mouth as needed.  Current Outpatient Medications (Analgesics):    acetaminophen (TYLENOL) 500 MG tablet, Take 500 mg by mouth as needed.   naproxen  sodium (ALEVE )  220 MG tablet, Take 1 tablet (220 mg total) by mouth 2 (two) times daily as needed. (Patient not taking: Reported on 01/30/2024)  Current Outpatient Medications (Hematological):    Ferrous Sulfate  Dried (FERROUS SULFATE  IRON ) 200 (65 Fe) MG TABS, Take 1 tablet by mouth daily.   vitamin B-12 (CYANOCOBALAMIN) 500 MCG tablet, Take 500 mcg by mouth daily.  Current Outpatient Medications (Other):    Calcium  Carb-Cholecalciferol (CALCIUM  600 + D) 600-200 MG-UNIT TABS, Take 1 tablet by mouth daily.   cyclobenzaprine  (FLEXERIL ) 10 MG tablet, TAKE 1/2 TO 1 (ONE-HALF TO ONE) TABLET BY MOUTH THREE TIMES DAILY AS NEEDED FOR MUSCLE SPASM   diphenhydramine-acetaminophen (TYLENOL PM) 25-500 MG TABS tablet, Take 1 tablet by mouth at bedtime as needed.  ELDERBERRY PO, Take by mouth.   famotidine (PEPCID) 20 MG tablet, Take 20 mg by mouth daily as needed for heartburn or indigestion.   lidocaine  (XYLOCAINE ) 5 % ointment, APPLY 1 APPLICATION TOPICALLY AS NEEDED   mirabegron  ER (MYRBETRIQ ) 25 MG TB24 tablet, Take 1 tablet (25 mg total) by mouth daily.   Multiple Vitamins-Minerals (ONE-A-DAY MENOPAUSE FORMULA) TABS, Take 1 tablet by mouth daily.   OVER THE COUNTER MEDICATION, OTC antacid as needed (cannot recall name)   Spacer/Aero-Holding Raguel FRENCH, Use with Symbicort   Medical History:  Past Medical History:  Diagnosis Date   Basal cell carcinoma (BCC) in situ of skin    Hypertension    Hypothyroid    Nephrolithiasis    OSA (obstructive sleep apnea)    Allergies:  Allergies  Allergen Reactions   Ivp Dye [Iodinated Contrast Media]     Hives, hot   Sulfa Antibiotics     Hives, hot flashes     Surgical History:  She  has a past surgical history that includes Sclerotherapy; Lithotripsy (Left, 1995); Cholecystectomy; and Elbow fracture surgery. Family History:  Her family history includes Alzheimer's disease in her paternal grandmother; Asthma in her father; COPD in her father; Diabetes Mellitus I  in her sister; Heart disease in her maternal grandfather, maternal grandmother, and paternal grandfather; Hypertension in her mother; Lymphoma in her mother; Pancreatic cancer in her father; Squamous cell carcinoma in her father; Thyroid  disease in her paternal grandmother.  REVIEW OF SYSTEMS  : All other systems reviewed and negative except where noted in the History of Present Illness.  PHYSICAL EXAM: BP 118/78 (BP Location: Left Arm, Patient Position: Sitting, Cuff Size: Normal)   Pulse 100   Ht 5' 1 (1.549 m)   Wt 277 lb 4 oz (125.8 kg)   SpO2 96%   BMI 52.39 kg/m  Physical Exam   MEASUREMENTS: BMI- 52.0. GENERAL APPEARANCE: Well nourished, in no apparent distress HEENT: No cervical lymphadenopathy, unremarkable thyroid , sclerae anicteric, conjunctiva pink RESPIRATORY: Respiratory effort normal, BS equal bilateral without rales, rhonchi, wheezing CARDIO: RRR with no MRGs, peripheral pulses intact ABDOMEN: Soft, non distended, active bowel sounds in all 4 quadrants, no tenderness to palpation, no rebound, no mass appreciated RECTAL: declines MUSCULOSKELETAL: Full ROM, normal gait, without edema SKIN: Dry, intact without rashes or lesions. No jaundice. NEURO: Alert, oriented, no focal deficits PSYCH: Cooperative, normal mood and affect.      Alan JONELLE Coombs, PA-C 3:36 PM

## 2024-02-04 ENCOUNTER — Encounter (HOSPITAL_BASED_OUTPATIENT_CLINIC_OR_DEPARTMENT_OTHER): Admitting: General Surgery

## 2024-02-04 DIAGNOSIS — L97812 Non-pressure chronic ulcer of other part of right lower leg with fat layer exposed: Secondary | ICD-10-CM | POA: Diagnosis not present

## 2024-02-11 ENCOUNTER — Encounter (HOSPITAL_BASED_OUTPATIENT_CLINIC_OR_DEPARTMENT_OTHER): Attending: General Surgery | Admitting: General Surgery

## 2024-02-11 DIAGNOSIS — L97812 Non-pressure chronic ulcer of other part of right lower leg with fat layer exposed: Secondary | ICD-10-CM | POA: Diagnosis present

## 2024-02-11 DIAGNOSIS — I872 Venous insufficiency (chronic) (peripheral): Secondary | ICD-10-CM | POA: Diagnosis not present

## 2024-02-11 DIAGNOSIS — Z6841 Body Mass Index (BMI) 40.0 and over, adult: Secondary | ICD-10-CM | POA: Diagnosis not present

## 2024-02-11 DIAGNOSIS — I890 Lymphedema, not elsewhere classified: Secondary | ICD-10-CM | POA: Insufficient documentation

## 2024-02-12 ENCOUNTER — Telehealth: Payer: Self-pay

## 2024-02-12 NOTE — Telephone Encounter (Signed)
 Lmtcb. (Re: procedure time changed on 12/02 to 2:41 pm, arrival time 1:10 pm)

## 2024-02-13 NOTE — Telephone Encounter (Signed)
 PT returned call. Advised patient of the new times for her procedure 12/2

## 2024-02-17 ENCOUNTER — Other Ambulatory Visit: Payer: Self-pay | Admitting: Family Medicine

## 2024-02-17 DIAGNOSIS — M545 Low back pain, unspecified: Secondary | ICD-10-CM

## 2024-02-18 ENCOUNTER — Encounter (HOSPITAL_BASED_OUTPATIENT_CLINIC_OR_DEPARTMENT_OTHER): Admitting: General Surgery

## 2024-02-18 DIAGNOSIS — L97812 Non-pressure chronic ulcer of other part of right lower leg with fat layer exposed: Secondary | ICD-10-CM | POA: Diagnosis not present

## 2024-02-23 ENCOUNTER — Other Ambulatory Visit: Payer: Self-pay | Admitting: Family Medicine

## 2024-02-23 DIAGNOSIS — I10 Essential (primary) hypertension: Secondary | ICD-10-CM

## 2024-02-25 ENCOUNTER — Other Ambulatory Visit: Payer: Self-pay | Admitting: Family Medicine

## 2024-02-25 ENCOUNTER — Encounter (HOSPITAL_BASED_OUTPATIENT_CLINIC_OR_DEPARTMENT_OTHER): Admitting: General Surgery

## 2024-02-25 DIAGNOSIS — L97812 Non-pressure chronic ulcer of other part of right lower leg with fat layer exposed: Secondary | ICD-10-CM | POA: Diagnosis not present

## 2024-02-25 DIAGNOSIS — M545 Low back pain, unspecified: Secondary | ICD-10-CM

## 2024-03-02 ENCOUNTER — Encounter (HOSPITAL_COMMUNITY): Payer: Self-pay | Admitting: Pediatrics

## 2024-03-03 ENCOUNTER — Telehealth: Payer: Self-pay

## 2024-03-03 ENCOUNTER — Encounter (HOSPITAL_BASED_OUTPATIENT_CLINIC_OR_DEPARTMENT_OTHER): Admitting: General Surgery

## 2024-03-03 DIAGNOSIS — L97812 Non-pressure chronic ulcer of other part of right lower leg with fat layer exposed: Secondary | ICD-10-CM | POA: Diagnosis not present

## 2024-03-03 NOTE — Telephone Encounter (Signed)
 Called and spoke with patient to confirm colonoscopy appt at [facility] on 03/09/24 with Dr. Suzann. Patient is aware that she will need to arrive at [facility] by 1 pm with a care partner. Patient has picked up her prep. I briefly reviewed prep instructions with patient. Informed patient that I will send formal instructions to her MyChart. Patient verbalized understanding and had no concerns at the end of the call.

## 2024-03-03 NOTE — Telephone Encounter (Signed)
 Procedure:COLON Procedure date: 03/09/24 Procedure location: [facility] Arrival Time: 1:11 Spoke with the patient Y/N: N Any prep concerns? N  Has the patient obtained the prep from the pharmacy ? N Do you have a care partner and transportation: N Any additional concerns? N  I called  the patient 3 times and I left a detailed message about the procedure and the office number in case the patient has questions are concerns.

## 2024-03-04 ENCOUNTER — Other Ambulatory Visit: Payer: Self-pay | Admitting: Family Medicine

## 2024-03-04 DIAGNOSIS — E039 Hypothyroidism, unspecified: Secondary | ICD-10-CM

## 2024-03-09 ENCOUNTER — Encounter: Payer: BC Managed Care – PPO | Admitting: Family Medicine

## 2024-03-09 ENCOUNTER — Other Ambulatory Visit: Payer: Self-pay

## 2024-03-09 ENCOUNTER — Ambulatory Visit (HOSPITAL_COMMUNITY)
Admission: RE | Admit: 2024-03-09 | Discharge: 2024-03-09 | Disposition: A | Attending: Pediatrics | Admitting: Pediatrics

## 2024-03-09 ENCOUNTER — Encounter (HOSPITAL_COMMUNITY): Admission: RE | Disposition: A | Payer: Self-pay | Source: Home / Self Care | Attending: Pediatrics

## 2024-03-09 ENCOUNTER — Ambulatory Visit (HOSPITAL_COMMUNITY): Payer: Self-pay | Admitting: Anesthesiology

## 2024-03-09 ENCOUNTER — Encounter (HOSPITAL_COMMUNITY): Payer: Self-pay | Admitting: Anesthesiology

## 2024-03-09 DIAGNOSIS — E039 Hypothyroidism, unspecified: Secondary | ICD-10-CM | POA: Diagnosis not present

## 2024-03-09 DIAGNOSIS — G4733 Obstructive sleep apnea (adult) (pediatric): Secondary | ICD-10-CM | POA: Diagnosis not present

## 2024-03-09 DIAGNOSIS — Z1211 Encounter for screening for malignant neoplasm of colon: Secondary | ICD-10-CM

## 2024-03-09 DIAGNOSIS — I10 Essential (primary) hypertension: Secondary | ICD-10-CM

## 2024-03-09 DIAGNOSIS — K5730 Diverticulosis of large intestine without perforation or abscess without bleeding: Secondary | ICD-10-CM

## 2024-03-09 HISTORY — PX: COLONOSCOPY: SHX5424

## 2024-03-09 SURGERY — COLONOSCOPY
Anesthesia: Monitor Anesthesia Care

## 2024-03-09 MED ORDER — PROPOFOL 500 MG/50ML IV EMUL
INTRAVENOUS | Status: AC
  Filled 2024-03-09: qty 50

## 2024-03-09 MED ORDER — SODIUM CHLORIDE 0.9 % IV SOLN: INTRAVENOUS | Status: DC

## 2024-03-09 MED ORDER — PROPOFOL 1000 MG/100ML IV EMUL
INTRAVENOUS | Status: AC
  Filled 2024-03-09: qty 100

## 2024-03-09 MED ORDER — DEXAMETHASONE SOD PHOSPHATE PF 10 MG/ML IJ SOLN
INTRAMUSCULAR | Status: DC | PRN
  Administered 2024-03-09: 10 mg via INTRAVENOUS

## 2024-03-09 MED ORDER — PROPOFOL 500 MG/50ML IV EMUL
INTRAVENOUS | Status: DC | PRN
  Administered 2024-03-09: 200 ug/kg/min via INTRAVENOUS
  Administered 2024-03-09: 100 mg via INTRAVENOUS

## 2024-03-09 MED ORDER — ONDANSETRON HCL 4 MG/2ML IJ SOLN
INTRAMUSCULAR | Status: DC | PRN
  Administered 2024-03-09: 4 mg via INTRAVENOUS

## 2024-03-09 NOTE — Op Note (Signed)
 [facility] Patient Name: [name] Procedure Date: 03/09/2024 MRN: [id number] Attending MD: Inocente Hausen , MD, [id number] Date of Birth: 07-15-1960 CSN: [id number] Age: 63 Admit Type: Inpatient Procedure:                Colonoscopy Indications:              Screening for colorectal malignant neoplasm, Last                            colonoscopy 10-15 years ago normal Providers:                Inocente Hausen, MD, Ozell Pouch, Farris Southgate,                            Technician Referring MD:              Medicines:                Monitored Anesthesia Care Complications:            No immediate complications. Estimated blood loss:                            None. Estimated Blood Loss:     Estimated blood loss: none. Estimated blood loss:                            none. Procedure:                Pre-Anesthesia Assessment:                           - Prior to the procedure, a History and Physical                            was performed, and patient medications and                            allergies were reviewed. The patient's tolerance of                            previous anesthesia was also reviewed. The risks                            and benefits of the procedure and the sedation                            options and risks were discussed with the patient.                            All questions were answered, and informed consent                            was obtained. Prior Anticoagulants: The patient has                            taken no anticoagulant or antiplatelet agents. ASA  Grade Assessment: III - A patient with severe                            systemic disease. After reviewing the risks and                            benefits, the patient was deemed in satisfactory                            condition to undergo the procedure.                           After obtaining informed consent, the colonoscope                             was passed under direct vision. Throughout the                            procedure, the patient's blood pressure, pulse, and                            oxygen saturations were monitored continuously. The                            CF-HQ190L ([id number]) Olympus colonoscope was                            introduced through the anus and advanced to the                            cecum, identified by appendiceal orifice and                            ileocecal valve. The colonoscopy was performed                            without difficulty. The patient tolerated the                            procedure well. The quality of the bowel                            preparation was excellent except the cecum was                            fair. The ileocecal valve, appendiceal orifice, and                            rectum were photographed. Scope In: 3:17:36 PM Scope Out: 3:35:51 PM Scope Withdrawal Time: 0 hours 11 minutes 52 seconds  Total Procedure Duration: 0 hours 18 minutes 15 seconds  Findings:      The perianal and digital rectal examinations were normal. Pertinent       negatives include normal sphincter tone and no palpable rectal lesions.  A moderate amount of semi-liquid stool was found in the transverse       colon, in the ascending colon and in the cecum. Lavage of the area was       performed using a moderate amount of sterile water, resulting in       clearance with adequate visualization. Residual stool remained present       in the cecum.      Multiple small-mouthed diverticula were found in the sigmoid colon,       descending colon, transverse colon and ascending colon.      The retroflexed view of the distal rectum and anal verge was normal and       showed no anal or rectal abnormalities. Impression:               - Stool in the transverse colon, in the ascending                            colon and in the cecum.                           -  Diverticulosis in the sigmoid colon, in the                            descending colon, in the transverse colon and in                            the ascending colon.                           - No specimens collected. Moderate Sedation:      Not Applicable - Patient had care per Anesthesia. Recommendation:           - Discharge patient to home (ambulatory).                           - Repeat colonoscopy in 5 years for screening                            purposes given residual stool in the left colon and                            cecum. Mass lesions and large polyps are excluded                            on today's colonoscopy, however, small polyps could                            have been obscured by stool.                           - The findings and recommendations were discussed                            with the patient's family.                           -  Patient has a contact number available for                            emergencies. The signs and symptoms of potential                            delayed complications were discussed with the                            patient. Return to normal activities tomorrow.                            Written discharge instructions were provided to the                            patient. Procedure Code(s):        --- Professional ---                           [phone], Colonoscopy, flexible; diagnostic, including                            collection of specimen(s) by brushing or washing,                            when performed (separate procedure) Diagnosis Code(s):        --- Professional ---                           Z12.11, Encounter for screening for malignant                            neoplasm of colon CPT copyright 2022 [employer]. All rights reserved. The codes documented in this report are preliminary and upon coder review may  be revised to meet current compliance requirements. Inocente Hausen,  MD 03/09/2024 3:47:32 PM This report has been signed electronically. Number of Addenda: 0

## 2024-03-09 NOTE — Transfer of Care (Signed)
 Immediate Anesthesia Transfer of Care Note  Patient: [name]  Procedure(s) Performed: COLONOSCOPY  Patient Location: PACU and Endoscopy Unit  Anesthesia Type:MAC  Level of Consciousness: awake, alert , oriented, and patient cooperative  Airway & Oxygen Therapy: Patient Spontanous Breathing  Post-op Assessment: Report given to RN and Post -op Vital signs reviewed and stable  Post vital signs: Reviewed and stable  Last Vitals:  Vitals Value Taken Time  BP 114/50 03/09/24 15:43  Temp 36.5 C 03/09/24 15:43  Pulse 95 03/09/24 15:46  Resp 19 03/09/24 15:46  SpO2 100 % 03/09/24 15:46  Vitals shown include unfiled device data.  Last Pain:  Vitals:   03/09/24 1543  TempSrc: Temporal  PainSc: 0-No pain         Complications: No notable events documented.

## 2024-03-09 NOTE — Anesthesia Preprocedure Evaluation (Addendum)
 Anesthesia Evaluation  Patient identified by MRN, date of birth, ID band Patient awake    Reviewed: Allergy & Precautions, H&P , NPO status , Patient's Chart, lab work & pertinent test results  Airway Mallampati: III  TM Distance: <3 FB Neck ROM: Full   Comment: ANTERIOR Dental no notable dental hx. (+) Teeth Intact, Dental Advisory Given, Caps   Pulmonary neg pulmonary ROS, sleep apnea and Continuous Positive Airway Pressure Ventilation    Pulmonary exam normal breath sounds clear to auscultation       Cardiovascular Exercise Tolerance: Good hypertension, Pt. on medications negative cardio ROS Normal cardiovascular exam Rhythm:Regular Rate:Normal  ECHO  06/18/23 1. Left ventricular ejection fraction, by estimation, is 55 to 60%. The left ventricle has normal function. The left ventricle has no regional wall motion abnormalities. Left ventricular diastolic parameters were normal.  2. Right ventricular systolic function is normal. The right ventricular size is normal. There is normal pulmonary artery systolic pressure. The estimated right ventricular systolic pressure is 29.5 mmHg.  3. The mitral valve is normal in structure. Trivial mitral valve regurgitation.  4. The aortic valve was not well visualized. Aortic valve regurgitation is not visualized. No aortic stenosis is present.  5. The inferior vena cava is dilated in size with >50% respiratory variability, suggesting right atrial pressure of 8 mmHg.      Neuro/Psych negative neurological ROS  negative psych ROS   GI/Hepatic negative GI ROS, Neg liver ROS,,,  Endo/Other  negative endocrine ROSHypothyroidism    Renal/GU Renal diseasenegative Renal ROS  negative genitourinary   Musculoskeletal negative musculoskeletal ROS (+)    Abdominal   Peds negative pediatric ROS (+)  Hematology negative hematology ROS (+)   Anesthesia Other Findings    Reproductive/Obstetrics negative OB ROS                              Anesthesia Physical Anesthesia Plan  ASA: 2  Anesthesia Plan: MAC   Post-op Pain Management:    Induction: Intravenous  PONV Risk Score and Plan: 2 and Propofol infusion  Airway Management Planned: Mask, Natural Airway and Nasal Cannula  Additional Equipment: None  Intra-op Plan:   Post-operative Plan:   Informed Consent: I have reviewed the patients History and Physical, chart, labs and discussed the procedure including the risks, benefits and alternatives for the proposed anesthesia with the patient or authorized representative who has indicated his/her understanding and acceptance.       Plan Discussed with: Anesthesiologist and CRNA  Anesthesia Plan Comments:          Anesthesia Quick Evaluation

## 2024-03-09 NOTE — Discharge Instructions (Signed)

## 2024-03-09 NOTE — Anesthesia Procedure Notes (Signed)
 Procedure Name: MAC Date/Time: 03/09/2024 3:15 PM  Performed by: Nada Corean CROME, CRNAPre-anesthesia Checklist: Patient identified, Emergency Drugs available, Suction available, Patient being monitored and Timeout performed Patient Re-evaluated:Patient Re-evaluated prior to induction Oxygen Delivery Method: Nasal cannula Preoxygenation: Pre-oxygenation with 100% oxygen Induction Type: IV induction Placement Confirmation: positive ETCO2 Dental Injury: Teeth and Oropharynx as per pre-operative assessment  Comments: Optiflow nasal cannula

## 2024-03-09 NOTE — H&P (Signed)
 [facility] History and Physical   Primary Care Physician:  Ozell Heron HERO, MD   Reason for Procedure:  Colorectal cancer screening  Plan:    Colonoscopy   The patient was provided an opportunity to ask questions and all were answered. The patient agreed with the plan.   HPI: [name] is a 63 y.o. female undergoing colonoscopy for colorectal cancer screening.  Last colonoscopy was performed in 2010 or 2015 in [place] .  Patient does not recall any abnormalities on colonoscopy.  No family history of colorectal cancer.  She denies change in bowel habits or rectal bleeding at the time of today's exam.  BMI is 52 -procedure being performed in hospital-based setting for elevated BMI.   Past Medical History:  Diagnosis Date   Basal cell carcinoma (BCC) in situ of skin    Hypertension    Hypothyroid    Nephrolithiasis    OSA (obstructive sleep apnea)     Past Surgical History:  Procedure Laterality Date   CHOLECYSTECTOMY     ELBOW FRACTURE SURGERY     LITHOTRIPSY Left 1995   SCLEROTHERAPY      Prior to Admission medications   Medication Sig Start Date End Date Taking? Authorizing Provider  acetaminophen (TYLENOL) 500 MG tablet Take 500 mg by mouth as needed.   Yes [provider]  alendronate  (FOSAMAX ) 70 MG tablet Take with a full glass of water on an empty stomach.TAKE 1 TABLET BY MOUTH ONCE A WEEK TAKE  WITH  A  FULL  GLASS  OF  WATER  ON  AN  EMPTY  STOMACH 09/02/23  Yes Ozell Heron HERO, MD  budesonide -formoterol  (SYMBICORT ) 80-4.5 MCG/ACT inhaler Inhale 2 puffs into the lungs in the morning and at bedtime. 04/16/23  Yes [facility], Comer GAILS, NP  Calcium  Carb-Cholecalciferol (CALCIUM  600 + D) 600-200 MG-UNIT TABS Take 1 tablet by mouth daily. 11/27/18  Yes Koberlein, Junell C, MD  cyclobenzaprine  (FLEXERIL ) 10 MG tablet TAKE 1/2 TO 1 (ONE-HALF TO ONE) TABLET BY MOUTH THREE TIMES DAILY AS NEEDED FOR MUSCLE SPASM 09/29/23  Yes Ozell Heron HERO, MD   diphenhydrAMINE (BENADRYL) 25 MG tablet Take 25 mg by mouth as needed.   Yes [provider]  diphenhydramine-acetaminophen (TYLENOL PM) 25-500 MG TABS tablet Take 1 tablet by mouth at bedtime as needed.   Yes [provider]  ELDERBERRY PO Take by mouth.   Yes [provider]  famotidine (PEPCID) 20 MG tablet Take 20 mg by mouth daily as needed for heartburn or indigestion.   Yes [provider]  Ferrous Sulfate  Dried (FERROUS SULFATE  IRON ) 200 (65 Fe) MG TABS Take 1 tablet by mouth daily. 11/27/18  Yes Koberlein, Junell C, MD  furosemide  (LASIX ) 20 MG tablet Take 1 tablet (20 mg total) by mouth daily. 09/02/23  Yes Ozell Heron HERO, MD  hydrochlorothiazide  (HYDRODIURIL ) 50 MG tablet Take 1 tablet (50 mg total) by mouth daily. 09/02/23  Yes Ozell Heron HERO, MD  levothyroxine  (SYNTHROID ) 175 MCG tablet TAKE 1 TABLET BY MOUTH ONCE DAILY BEFORE BREAKFAST 03/07/24  Yes Ozell Heron HERO, MD  lidocaine  (XYLOCAINE ) 5 % ointment APPLY  OINTMENT EXTERNALLY AS NEEDED 02/26/24  Yes Ozell Heron HERO, MD  losartan  (COZAAR ) 100 MG tablet Take 1 tablet by mouth once daily 02/23/24  Yes Ozell Heron HERO, MD  Multiple Vitamins-Minerals (ONE-A-DAY MENOPAUSE FORMULA) TABS Take 1 tablet by mouth daily. 11/27/18  Yes Koberlein, Junell C, MD  Na Sulfate-K Sulfate-Mg Sulfate concentrate (SUPREP BOWEL PREP  KIT) 17.5-3.13-1.6 GM/177ML SOLN Take 1 kit (354 mLs total) by mouth as directed. For colonoscopy prep 01/30/24  Yes Craig Alan SAUNDERS, PA-C  OVER THE COUNTER MEDICATION OTC antacid as needed (cannot recall name)   Yes [provider]  Spacer/Aero-Holding Chambers DEVI Use with Symbicort  03/05/23  Yes [facility], Comer GAILS, NP  vitamin B-12 (CYANOCOBALAMIN) 500 MCG tablet Take 500 mcg by mouth daily.   Yes [provider]  mirabegron  ER (MYRBETRIQ ) 25 MG TB24 tablet Take 1 tablet (25 mg total) by mouth daily. 03/28/21   Koberlein, Junell C, MD  naproxen  sodium  (ALEVE ) 220 MG tablet Take 1 tablet (220 mg total) by mouth 2 (two) times daily as needed. Patient not taking: Reported on 01/30/2024 11/27/18   Koberlein, Junell C, MD    No current facility-administered medications for this encounter.    Allergies as of 01/30/2024 - Review Complete 01/30/2024  Allergen Reaction Noted   Ivp dye [iodinated contrast media]  11/27/2018   Sulfa antibiotics  11/27/2018    Family History  Problem Relation Age of Onset   Hypertension Mother    Lymphoma Mother    Squamous cell carcinoma Father    COPD Father    Asthma Father    Pancreatic cancer Father    Diabetes Mellitus I Sister    Heart disease Maternal Grandmother    Heart disease Maternal Grandfather    Thyroid  disease Paternal Grandmother    Alzheimer's disease Paternal Grandmother    Heart disease Paternal Grandfather     Social History   Socioeconomic History   Marital status: Unknown    Spouse name: Not on file   Number of children: Not on file   Years of education: Not on file   Highest education level: Not on file  Occupational History   Not on file  Tobacco Use   Smoking status: Never   Smokeless tobacco: Never  Vaping Use   Vaping status: Never Used  Substance and Sexual Activity   Alcohol use: Never   Drug use: Never   Sexual activity: Not Currently  Other Topics Concern   Not on file  Social History Narrative   Not on file   Social Drivers of Health   Financial Resource Strain: Not on file  Food Insecurity: Not on file  Transportation Needs: Not on file  Physical Activity: Not on file  Stress: Not on file  Social Connections: Not on file  Intimate Partner Violence: Not on file    Review of Systems:  All other review of systems negative except as mentioned in the HPI.  Physical Exam: Vital signs Wt 125 kg   BMI 52.07 kg/m   General:   Alert,  Well-developed, well-nourished, pleasant and cooperative in NAD Lungs:  Clear throughout to auscultation.    Heart:  Regular rate and rhythm; no murmurs, clicks, rubs,  or gallops. Abdomen:  Soft, nontender and nondistended. Normal bowel sounds.   Neuro/Psych:  Normal mood and affect. A and O x 3  Inocente Hausen, MD [facility]

## 2024-03-10 ENCOUNTER — Encounter (HOSPITAL_COMMUNITY): Payer: Self-pay | Admitting: Pediatrics

## 2024-03-10 ENCOUNTER — Ambulatory Visit: Admitting: Family Medicine

## 2024-03-10 ENCOUNTER — Encounter (HOSPITAL_BASED_OUTPATIENT_CLINIC_OR_DEPARTMENT_OTHER): Admitting: General Surgery

## 2024-03-10 VITALS — BP 132/88 | HR 85 | Temp 98.1°F | Ht 60.75 in | Wt 276.1 lb

## 2024-03-10 DIAGNOSIS — Z23 Encounter for immunization: Secondary | ICD-10-CM | POA: Diagnosis not present

## 2024-03-10 DIAGNOSIS — L97812 Non-pressure chronic ulcer of other part of right lower leg with fat layer exposed: Secondary | ICD-10-CM | POA: Insufficient documentation

## 2024-03-10 DIAGNOSIS — I872 Venous insufficiency (chronic) (peripheral): Secondary | ICD-10-CM | POA: Insufficient documentation

## 2024-03-10 DIAGNOSIS — E039 Hypothyroidism, unspecified: Secondary | ICD-10-CM | POA: Diagnosis not present

## 2024-03-10 DIAGNOSIS — M810 Age-related osteoporosis without current pathological fracture: Secondary | ICD-10-CM | POA: Diagnosis not present

## 2024-03-10 DIAGNOSIS — G4733 Obstructive sleep apnea (adult) (pediatric): Secondary | ICD-10-CM

## 2024-03-10 DIAGNOSIS — I10 Essential (primary) hypertension: Secondary | ICD-10-CM | POA: Diagnosis not present

## 2024-03-10 DIAGNOSIS — R600 Localized edema: Secondary | ICD-10-CM

## 2024-03-10 DIAGNOSIS — Z6841 Body Mass Index (BMI) 40.0 and over, adult: Secondary | ICD-10-CM | POA: Diagnosis not present

## 2024-03-10 DIAGNOSIS — Z13220 Encounter for screening for lipoid disorders: Secondary | ICD-10-CM | POA: Diagnosis not present

## 2024-03-10 DIAGNOSIS — M545 Low back pain, unspecified: Secondary | ICD-10-CM

## 2024-03-10 DIAGNOSIS — Z131 Encounter for screening for diabetes mellitus: Secondary | ICD-10-CM | POA: Diagnosis not present

## 2024-03-10 DIAGNOSIS — Z0000 Encounter for general adult medical examination without abnormal findings: Secondary | ICD-10-CM

## 2024-03-10 DIAGNOSIS — I890 Lymphedema, not elsewhere classified: Secondary | ICD-10-CM | POA: Diagnosis not present

## 2024-03-10 LAB — COMPREHENSIVE METABOLIC PANEL WITH GFR
ALT: 17 U/L (ref 0–35)
AST: 25 U/L (ref 0–37)
Albumin: 4.4 g/dL (ref 3.5–5.2)
Alkaline Phosphatase: 67 U/L (ref 39–117)
BUN: 19 mg/dL (ref 6–23)
CO2: 24 meq/L (ref 19–32)
Calcium: 9 mg/dL (ref 8.4–10.5)
Chloride: 101 meq/L (ref 96–112)
Creatinine, Ser: 0.87 mg/dL (ref 0.40–1.20)
GFR: 70.04 mL/min (ref >60.00)
Glucose, Bld: 107 mg/dL — ABNORMAL HIGH (ref 70–99)
Potassium: 3.8 meq/L (ref 3.5–5.1)
Sodium: 138 meq/L (ref 135–145)
Total Bilirubin: 0.5 mg/dL (ref 0.2–1.2)
Total Protein: 7.6 g/dL (ref 6.0–8.3)

## 2024-03-10 LAB — CBC WITH DIFFERENTIAL/PLATELET
Basophils Absolute: 0 K/uL (ref 0.0–0.1)
Basophils Relative: 0 % (ref 0.0–3.0)
Eosinophils Absolute: 0 K/uL (ref 0.0–0.7)
Eosinophils Relative: 0 % (ref 0.0–5.0)
HCT: 36.7 % (ref 36.0–46.0)
Hemoglobin: 12.7 g/dL (ref 12.0–15.0)
Lymphocytes Relative: 6.8 % — ABNORMAL LOW (ref 12.0–46.0)
Lymphs Abs: 0.7 K/uL (ref 0.7–4.0)
MCHC: 34.6 g/dL (ref 30.0–36.0)
MCV: 96.3 fl (ref 78.0–100.0)
Monocytes Absolute: 0.2 K/uL (ref 0.1–1.0)
Monocytes Relative: 2.5 % — ABNORMAL LOW (ref 3.0–12.0)
Neutro Abs: 8.8 K/uL — ABNORMAL HIGH (ref 1.4–7.7)
Neutrophils Relative %: 90.7 % — ABNORMAL HIGH (ref 43.0–77.0)
Platelets: 207 K/uL (ref 150.0–400.0)
RBC: 3.82 Mil/uL — ABNORMAL LOW (ref 3.87–5.11)
RDW: 12.9 % (ref 11.5–15.5)
WBC: 9.7 K/uL (ref 4.0–10.5)

## 2024-03-10 LAB — HEMOGLOBIN A1C: Hgb A1c MFr Bld: 5.7 % (ref 4.6–6.5)

## 2024-03-10 LAB — LIPID PANEL
Cholesterol: 166 mg/dL (ref 0–200)
HDL: 46.6 mg/dL (ref >39.00)
LDL Cholesterol: 105 mg/dL — ABNORMAL HIGH (ref 0–99)
NonHDL: 118.95
Total CHOL/HDL Ratio: 4
Triglycerides: 69 mg/dL (ref 0.0–149.0)
VLDL: 13.8 mg/dL (ref 0.0–40.0)

## 2024-03-10 LAB — TSH: TSH: 0.42 u[IU]/mL (ref 0.35–5.50)

## 2024-03-10 MED ORDER — ZEPBOUND 2.5 MG/0.5ML ~~LOC~~ SOAJ: 2.5000 mg | SUBCUTANEOUS | 1 refills | Status: DC

## 2024-03-10 MED ORDER — ZEPBOUND 2.5 MG/0.5ML ~~LOC~~ SOAJ: 2.5000 mg | SUBCUTANEOUS | 1 refills | Status: AC

## 2024-03-10 MED ORDER — LOSARTAN POTASSIUM 100 MG PO TABS: 100.0000 mg | ORAL_TABLET | Freq: Every day | ORAL | 1 refills | Status: AC

## 2024-03-10 MED ORDER — FUROSEMIDE 20 MG PO TABS: 20.0000 mg | ORAL_TABLET | Freq: Every day | ORAL | 1 refills | Status: AC

## 2024-03-10 MED ORDER — LEVOTHYROXINE SODIUM 175 MCG PO TABS: 175.0000 ug | ORAL_TABLET | Freq: Every day | ORAL | 1 refills | Status: AC

## 2024-03-10 MED ORDER — HYDROCHLOROTHIAZIDE 50 MG PO TABS: 50.0000 mg | ORAL_TABLET | Freq: Every day | ORAL | 1 refills | Status: AC

## 2024-03-10 MED ORDER — ALENDRONATE SODIUM 70 MG PO TABS: ORAL_TABLET | ORAL | 1 refills | Status: AC

## 2024-03-10 MED ORDER — LIDOCAINE 5 % EX OINT: TOPICAL_OINTMENT | CUTANEOUS | 5 refills | Status: AC

## 2024-03-10 NOTE — Anesthesia Postprocedure Evaluation (Signed)
 Anesthesia Post Note  Patient: [name]  Procedure(s) Performed: COLONOSCOPY     Patient location during evaluation: PACU Anesthesia Type: MAC Level of consciousness: awake and alert Pain management: pain level controlled Vital Signs Assessment: post-procedure vital signs reviewed and stable Respiratory status: spontaneous breathing, nonlabored ventilation and respiratory function stable Cardiovascular status: blood pressure returned to baseline and stable Postop Assessment: no apparent nausea or vomiting Anesthetic complications: no   No notable events documented.  Last Vitals:  Vitals:   03/09/24 1550 03/09/24 1600  BP: (!) 118/90 128/67  Pulse: 94 89  Resp: (!) 22 17  Temp:    SpO2: 98% 100%    Last Pain:  Vitals:   03/09/24 1600  TempSrc:   PainSc: 0-No pain                 [name]

## 2024-03-10 NOTE — Assessment & Plan Note (Signed)
 Patient has been on CPAP for many years, however the patient would benefit from weight loss therapy for this in addition to her multiple comorbid conditions. I counseled the patient on healthy diet and exercise habits as well today. Zepbound rx sent.

## 2024-03-10 NOTE — Progress Notes (Signed)
 Complete physical exam  Patient: [name]   DOB: 06-23-1960   64 y.o. Female  MRN: [id number]  Subjective:    Chief Complaint  Patient presents with   Annual Exam    [name] is a 63 y.o. female who presents today for a complete physical exam. She reports consuming a general diet. Exercise is limited by orthopedic condition(s): chronic back and knee pain. She generally feels well. She reports sleeping well. She does not have additional problems to discuss today.    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   budesonide -formoterol  (SYMBICORT ) 80-4.5 MCG/ACT inhaler Inhale 2 puffs into the lungs in the morning and at bedtime.   Calcium  Carb-Cholecalciferol (CALCIUM  600 + D) 600-200 MG-UNIT TABS Take 1 tablet by mouth daily.   cyclobenzaprine  (FLEXERIL ) 10 MG tablet TAKE 1/2 TO 1 (ONE-HALF TO ONE)  TABLET BY MOUTH THREE TIMES DAILY AS NEEDED FOR MUSCLE SPASM   diphenhydrAMINE (BENADRYL) 25 MG tablet Take 25 mg by mouth as needed.   diphenhydramine-acetaminophen (TYLENOL PM) 25-500 MG TABS tablet Take 1 tablet by mouth at bedtime as needed.   ELDERBERRY PO Take by mouth.   famotidine (PEPCID) 20 MG tablet Take 20 mg by mouth daily as needed for heartburn or indigestion.   Ferrous Sulfate  Dried (FERROUS SULFATE  IRON ) 200 (65 Fe) MG TABS Take 1 tablet by mouth daily.   Multiple Vitamins-Minerals (ONE-A-DAY MENOPAUSE FORMULA) TABS Take 1 tablet by mouth daily.   naproxen  sodium (ALEVE ) 220 MG tablet Take 1 tablet (220 mg total) by mouth 2 (two) times daily as needed.   OVER THE COUNTER MEDICATION OTC antacid as needed (cannot recall name)   Spacer/Aero-Holding Raguel FRENCH Use with Symbicort    vitamin B-12 (CYANOCOBALAMIN) 500 MCG tablet Take 500 mcg by mouth daily.   [DISCONTINUED] alendronate  (FOSAMAX ) 70 MG tablet Take with a full glass of water on an empty stomach.TAKE 1 TABLET BY MOUTH ONCE A WEEK TAKE  WITH  A  FULL  GLASS  OF  WATER  ON  AN  EMPTY  STOMACH   [DISCONTINUED] furosemide  (LASIX ) 20 MG tablet Take 1 tablet (20 mg total) by mouth daily.   [DISCONTINUED] hydrochlorothiazide  (HYDRODIURIL ) 50 MG tablet Take 1 tablet (50  mg total) by mouth daily.   [DISCONTINUED] levothyroxine  (SYNTHROID ) 175 MCG tablet TAKE 1 TABLET BY MOUTH ONCE DAILY BEFORE BREAKFAST   [DISCONTINUED] lidocaine  (XYLOCAINE ) 5 % ointment APPLY  OINTMENT EXTERNALLY AS NEEDED   [DISCONTINUED] losartan  (COZAAR ) 100 MG tablet Take 1 tablet by mouth once daily   [DISCONTINUED] mirabegron  ER (MYRBETRIQ ) 25 MG TB24 tablet Take 1 tablet (25 mg total) by mouth daily.   [DISCONTINUED] Na Sulfate-K Sulfate-Mg Sulfate concentrate (SUPREP BOWEL PREP KIT) 17.5-3.13-1.6 GM/177ML SOLN Take 1 kit (354 mLs total) by mouth as directed. For colonoscopy prep    Review of Systems  HENT:  Negative for hearing loss.   Eyes:  Negative  for blurred vision.  Respiratory:  Negative for shortness of breath.   Cardiovascular:  Negative for chest pain.  Gastrointestinal: Negative.   Genitourinary: Negative.   Musculoskeletal:  Negative for back pain.  Neurological:  Negative for headaches.  Psychiatric/Behavioral:  Negative for depression.        Objective:     BP 132/88   Pulse 85   Temp 98.1 F (36.7 C) (Oral)   Ht 5' 0.75 (1.543 m)   Wt 276 lb 1.6 oz (125.2 kg)   SpO2 98%   BMI 52.60 kg/m    Physical Exam Vitals reviewed.  Constitutional:      Appearance: Normal appearance. She is well-groomed. She is morbidly obese.  HENT:     Right Ear: Tympanic membrane and ear canal normal.     Left Ear: Tympanic membrane and ear canal normal.     Mouth/Throat:     Mouth: Mucous membranes are moist.     Pharynx: No posterior oropharyngeal erythema.  Eyes:     Conjunctiva/sclera: Conjunctivae normal.  Neck:     Thyroid : No thyromegaly.  Cardiovascular:     Rate and Rhythm: Normal rate and regular rhythm.     Pulses: Normal pulses.     Heart sounds: S1 normal and S2 normal.  Pulmonary:     Effort: Pulmonary effort is normal.     Breath sounds: Normal breath sounds and air entry.  Abdominal:     General: Abdomen is flat. Bowel sounds are normal.     Palpations: Abdomen is soft.  Musculoskeletal:     Right lower leg: No edema.     Left lower leg: No edema.  Lymphadenopathy:     Cervical: No cervical adenopathy.  Neurological:     Mental Status: She is alert and oriented to person, place, and time. Mental status is at baseline.     Gait: Gait is intact.  Psychiatric:        Mood and Affect: Mood and affect normal.        Speech: Speech normal.        Behavior: Behavior normal.        Judgment: Judgment normal.         Assessment & Plan:    Routine Health Maintenance and Physical Exam  Immunization History  Administered Date(s) Administered   INFLUENZA, HIGH DOSE SEASONAL PF 03/10/2024    Influenza, Seasonal, Injecte, Preservative Fre 03/05/2023   Influenza,inj,Quad PF,6+ Mos 02/03/2019, 05/03/2020, 03/28/2021, 02/18/2022   PFIZER(Purple Top)SARS-COV-2 Vaccination 06/04/2019, 06/26/2019   Pfizer(Comirnaty)Fall Seasonal Vaccine 12 years and older 04/04/2022   Tdap 03/10/2018   Zoster Recombinant(Shingrix ) 02/03/2019, 04/07/2021, 04/04/2022    Health Maintenance  Topic Date Due   Pneumococcal Vaccine: 50+ Years (1 of 2 - PCV) Never done   COVID-19 Vaccine (4 - 2025-26 season)  12/08/2023   HIV Screening  03/10/2025 (Originally 01/06/1976)   Mammogram  09/21/2025   Cervical Cancer Screening (HPV/Pap Cotest)  03/04/2028   DTaP/Tdap/Td (2 - Td or Tdap) 03/10/2028   Colonoscopy  03/09/2034   Influenza Vaccine  Completed   Bone Density Scan  Completed   Hepatitis C Screening  Completed   Zoster Vaccines- Shingrix   Completed   Hepatitis B Vaccines 19-59 Average Risk  Aged Out   Meningococcal B Vaccine  Aged Out    Discussed health benefits of physical activity, and encouraged her to engage in regular exercise appropriate for her age and condition.  Immunization due -     Flu vaccine HIGH DOSE PF(Fluzone Trivalent)  Age-related osteoporosis without current pathological fracture -     Alendronate  Sodium; Take with a full glass of water on an empty stomach.TAKE 1 TABLET BY MOUTH ONCE A WEEK TAKE  WITH  A  FULL  GLASS  OF  WATER  ON  AN  EMPTY  STOMACH  Dispense: 12 tablet; Refill: 1  Lower extremity edema -     Furosemide ; Take 1 tablet (20 mg total) by mouth daily.  Dispense: 90 tablet; Refill: 1  Hypertension, unspecified type -     hydroCHLOROthiazide ; Take 1 tablet (50 mg total) by mouth daily.  Dispense: 90 tablet; Refill: 1 -     Losartan  Potassium; Take 1 tablet (100 mg total) by mouth daily.  Dispense: 90 tablet; Refill: 1 -     Comprehensive metabolic panel with GFR; Future -     CBC with Differential/Platelet; Future  Hypothyroidism, unspecified type -      Levothyroxine  Sodium; Take 1 tablet (175 mcg total) by mouth daily before breakfast.  Dispense: 90 tablet; Refill: 1 -     TSH; Future  Acute right-sided low back pain without sciatica -     Lidocaine ; APPLY  OINTMENT EXTERNALLY AS NEEDED  Dispense: 36 g; Refill: 5  Routine general medical examination at a health care facility  Lipid screening -     Lipid panel; Future  Diabetes mellitus screening -     Hemoglobin A1c; Future  OSA on CPAP Assessment & Plan: Patient has been on CPAP for many years, however the patient would benefit from weight loss therapy for this in addition to her multiple comorbid conditions. I counseled the patient on healthy diet and exercise habits as well today. Zepbound rx sent.   Orders: -     Zepbound; Inject 2.5 mg into the skin once a week.  Dispense: 2 mL; Refill: 1  General physical exam findings are normal today. I reviewed the patient's preventative testing, immunizations, and lifestyle habits. I made appropriate recommendations and placed orders for the appropriate tests and/or vaccinations. I counseled the patient on the CDC's recommendations for healthy exercise and diet. I counseled the patient on healthy sleep habits and stress management. Handouts to reinforce the counseling were given at the conclusion of the visit.    Return in about 6 months (around 09/08/2024) for HTN.     Heron CHRISTELLA Sharper, MD

## 2024-03-12 ENCOUNTER — Ambulatory Visit: Payer: Self-pay | Admitting: Family Medicine

## 2024-03-17 ENCOUNTER — Encounter (HOSPITAL_BASED_OUTPATIENT_CLINIC_OR_DEPARTMENT_OTHER): Admitting: Internal Medicine

## 2024-03-17 DIAGNOSIS — L97812 Non-pressure chronic ulcer of other part of right lower leg with fat layer exposed: Secondary | ICD-10-CM | POA: Diagnosis not present

## 2024-03-24 ENCOUNTER — Encounter (HOSPITAL_BASED_OUTPATIENT_CLINIC_OR_DEPARTMENT_OTHER): Admitting: General Surgery

## 2024-03-24 DIAGNOSIS — L97812 Non-pressure chronic ulcer of other part of right lower leg with fat layer exposed: Secondary | ICD-10-CM | POA: Diagnosis not present

## 2024-03-30 ENCOUNTER — Encounter (HOSPITAL_BASED_OUTPATIENT_CLINIC_OR_DEPARTMENT_OTHER): Admitting: General Surgery

## 2024-03-30 DIAGNOSIS — L97812 Non-pressure chronic ulcer of other part of right lower leg with fat layer exposed: Secondary | ICD-10-CM | POA: Diagnosis not present

## 2024-03-30 DIAGNOSIS — I10 Essential (primary) hypertension: Secondary | ICD-10-CM

## 2024-04-05 ENCOUNTER — Other Ambulatory Visit: Payer: Self-pay | Admitting: Family Medicine

## 2024-04-05 DIAGNOSIS — M545 Low back pain, unspecified: Secondary | ICD-10-CM

## 2024-04-07 ENCOUNTER — Encounter (HOSPITAL_BASED_OUTPATIENT_CLINIC_OR_DEPARTMENT_OTHER): Admitting: General Surgery

## 2024-04-07 DIAGNOSIS — L97812 Non-pressure chronic ulcer of other part of right lower leg with fat layer exposed: Secondary | ICD-10-CM | POA: Diagnosis not present

## 2024-04-14 ENCOUNTER — Encounter (HOSPITAL_BASED_OUTPATIENT_CLINIC_OR_DEPARTMENT_OTHER): Attending: General Surgery | Admitting: General Surgery

## 2024-04-14 DIAGNOSIS — L97812 Non-pressure chronic ulcer of other part of right lower leg with fat layer exposed: Secondary | ICD-10-CM | POA: Diagnosis present

## 2024-04-14 DIAGNOSIS — I872 Venous insufficiency (chronic) (peripheral): Secondary | ICD-10-CM | POA: Insufficient documentation

## 2024-04-14 DIAGNOSIS — I890 Lymphedema, not elsewhere classified: Secondary | ICD-10-CM | POA: Insufficient documentation

## 2024-04-20 NOTE — Addendum Note (Signed)
 Encounter addended by: Myrah Strawderman L on: 04/20/2024 10:13 AM  Actions taken: Imaging Exam ended

## 2024-04-21 ENCOUNTER — Encounter (HOSPITAL_BASED_OUTPATIENT_CLINIC_OR_DEPARTMENT_OTHER): Admitting: General Surgery

## 2024-04-21 DIAGNOSIS — L97812 Non-pressure chronic ulcer of other part of right lower leg with fat layer exposed: Secondary | ICD-10-CM | POA: Diagnosis not present

## 2024-04-26 ENCOUNTER — Other Ambulatory Visit (HOSPITAL_BASED_OUTPATIENT_CLINIC_OR_DEPARTMENT_OTHER)

## 2024-04-28 ENCOUNTER — Encounter (HOSPITAL_BASED_OUTPATIENT_CLINIC_OR_DEPARTMENT_OTHER): Admitting: General Surgery

## 2024-04-28 DIAGNOSIS — L97812 Non-pressure chronic ulcer of other part of right lower leg with fat layer exposed: Secondary | ICD-10-CM | POA: Diagnosis not present

## 2024-05-04 ENCOUNTER — Encounter (HOSPITAL_BASED_OUTPATIENT_CLINIC_OR_DEPARTMENT_OTHER): Admitting: General Surgery

## 2024-05-05 ENCOUNTER — Encounter (HOSPITAL_BASED_OUTPATIENT_CLINIC_OR_DEPARTMENT_OTHER): Admitting: General Surgery

## 2024-05-05 DIAGNOSIS — L97812 Non-pressure chronic ulcer of other part of right lower leg with fat layer exposed: Secondary | ICD-10-CM | POA: Diagnosis not present

## 2024-05-07 ENCOUNTER — Other Ambulatory Visit: Payer: Self-pay | Admitting: Nurse Practitioner

## 2024-05-07 DIAGNOSIS — R0609 Other forms of dyspnea: Secondary | ICD-10-CM

## 2024-05-12 ENCOUNTER — Encounter (HOSPITAL_BASED_OUTPATIENT_CLINIC_OR_DEPARTMENT_OTHER): Admitting: General Surgery

## 2024-05-19 ENCOUNTER — Encounter (HOSPITAL_BASED_OUTPATIENT_CLINIC_OR_DEPARTMENT_OTHER): Admitting: General Surgery

## 2024-05-26 ENCOUNTER — Encounter (HOSPITAL_BASED_OUTPATIENT_CLINIC_OR_DEPARTMENT_OTHER): Admitting: General Surgery

## 2025-03-14 ENCOUNTER — Encounter: Admitting: Family Medicine
